# Patient Record
Sex: Male | Born: 1955 | Smoking: Current every day smoker
Health system: Southern US, Community
[De-identification: ages and names within clinical notes are randomized; demographics above are authoritative.]

## PROBLEM LIST (undated history)

## (undated) DIAGNOSIS — I10 Essential (primary) hypertension: Secondary | ICD-10-CM

## (undated) DIAGNOSIS — F32A Depression, unspecified: Secondary | ICD-10-CM

## (undated) DIAGNOSIS — H9190 Unspecified hearing loss, unspecified ear: Secondary | ICD-10-CM

## (undated) DIAGNOSIS — M1990 Unspecified osteoarthritis, unspecified site: Secondary | ICD-10-CM

## (undated) DIAGNOSIS — E119 Type 2 diabetes mellitus without complications: Secondary | ICD-10-CM

## (undated) DIAGNOSIS — I509 Heart failure, unspecified: Secondary | ICD-10-CM

## (undated) DIAGNOSIS — E785 Hyperlipidemia, unspecified: Secondary | ICD-10-CM

---

## 1993-02-08 HISTORY — PX: APPENDECTOMY: SHX54

## 2008-02-09 HISTORY — PX: CAROTID ENDARTERECTOMY: SUR193

## 2015-03-12 DIAGNOSIS — E782 Mixed hyperlipidemia: Secondary | ICD-10-CM | POA: Diagnosis not present

## 2015-03-12 DIAGNOSIS — E1165 Type 2 diabetes mellitus with hyperglycemia: Secondary | ICD-10-CM | POA: Diagnosis not present

## 2015-03-12 DIAGNOSIS — E559 Vitamin D deficiency, unspecified: Secondary | ICD-10-CM | POA: Diagnosis not present

## 2015-03-12 DIAGNOSIS — I10 Essential (primary) hypertension: Secondary | ICD-10-CM | POA: Diagnosis not present

## 2015-04-22 DIAGNOSIS — Z5181 Encounter for therapeutic drug level monitoring: Secondary | ICD-10-CM | POA: Diagnosis not present

## 2015-04-30 DIAGNOSIS — Z79899 Other long term (current) drug therapy: Secondary | ICD-10-CM | POA: Diagnosis not present

## 2015-04-30 DIAGNOSIS — E1165 Type 2 diabetes mellitus with hyperglycemia: Secondary | ICD-10-CM | POA: Diagnosis not present

## 2015-04-30 DIAGNOSIS — E782 Mixed hyperlipidemia: Secondary | ICD-10-CM | POA: Diagnosis not present

## 2015-04-30 DIAGNOSIS — I10 Essential (primary) hypertension: Secondary | ICD-10-CM | POA: Diagnosis not present

## 2015-04-30 DIAGNOSIS — E038 Other specified hypothyroidism: Secondary | ICD-10-CM | POA: Diagnosis not present

## 2015-04-30 DIAGNOSIS — D518 Other vitamin B12 deficiency anemias: Secondary | ICD-10-CM | POA: Diagnosis not present

## 2015-04-30 DIAGNOSIS — E559 Vitamin D deficiency, unspecified: Secondary | ICD-10-CM | POA: Diagnosis not present

## 2015-04-30 DIAGNOSIS — J069 Acute upper respiratory infection, unspecified: Secondary | ICD-10-CM | POA: Diagnosis not present

## 2015-05-14 DIAGNOSIS — J189 Pneumonia, unspecified organism: Secondary | ICD-10-CM | POA: Diagnosis not present

## 2015-05-14 DIAGNOSIS — R05 Cough: Secondary | ICD-10-CM | POA: Diagnosis not present

## 2015-05-14 DIAGNOSIS — E1165 Type 2 diabetes mellitus with hyperglycemia: Secondary | ICD-10-CM | POA: Diagnosis not present

## 2015-05-14 DIAGNOSIS — J188 Other pneumonia, unspecified organism: Secondary | ICD-10-CM | POA: Diagnosis not present

## 2015-06-17 DIAGNOSIS — J449 Chronic obstructive pulmonary disease, unspecified: Secondary | ICD-10-CM | POA: Diagnosis not present

## 2015-06-17 DIAGNOSIS — K5792 Diverticulitis of intestine, part unspecified, without perforation or abscess without bleeding: Secondary | ICD-10-CM | POA: Diagnosis not present

## 2015-06-17 DIAGNOSIS — E1165 Type 2 diabetes mellitus with hyperglycemia: Secondary | ICD-10-CM | POA: Diagnosis not present

## 2015-06-17 DIAGNOSIS — I10 Essential (primary) hypertension: Secondary | ICD-10-CM | POA: Diagnosis not present

## 2015-06-20 DIAGNOSIS — Z5181 Encounter for therapeutic drug level monitoring: Secondary | ICD-10-CM | POA: Diagnosis not present

## 2015-07-21 DIAGNOSIS — Z5181 Encounter for therapeutic drug level monitoring: Secondary | ICD-10-CM | POA: Diagnosis not present

## 2015-08-11 DIAGNOSIS — Z5181 Encounter for therapeutic drug level monitoring: Secondary | ICD-10-CM | POA: Diagnosis not present

## 2015-08-11 DIAGNOSIS — D518 Other vitamin B12 deficiency anemias: Secondary | ICD-10-CM | POA: Diagnosis not present

## 2015-08-11 DIAGNOSIS — Z0000 Encounter for general adult medical examination without abnormal findings: Secondary | ICD-10-CM | POA: Diagnosis not present

## 2015-08-11 DIAGNOSIS — J449 Chronic obstructive pulmonary disease, unspecified: Secondary | ICD-10-CM | POA: Diagnosis not present

## 2015-08-11 DIAGNOSIS — Z79899 Other long term (current) drug therapy: Secondary | ICD-10-CM | POA: Diagnosis not present

## 2015-08-11 DIAGNOSIS — E038 Other specified hypothyroidism: Secondary | ICD-10-CM | POA: Diagnosis not present

## 2015-08-11 DIAGNOSIS — E559 Vitamin D deficiency, unspecified: Secondary | ICD-10-CM | POA: Diagnosis not present

## 2015-08-11 DIAGNOSIS — I10 Essential (primary) hypertension: Secondary | ICD-10-CM | POA: Diagnosis not present

## 2015-08-11 DIAGNOSIS — E782 Mixed hyperlipidemia: Secondary | ICD-10-CM | POA: Diagnosis not present

## 2015-08-11 DIAGNOSIS — E1165 Type 2 diabetes mellitus with hyperglycemia: Secondary | ICD-10-CM | POA: Diagnosis not present

## 2015-09-15 DIAGNOSIS — Z79899 Other long term (current) drug therapy: Secondary | ICD-10-CM | POA: Diagnosis not present

## 2015-09-18 DIAGNOSIS — M545 Low back pain: Secondary | ICD-10-CM | POA: Diagnosis not present

## 2015-09-18 DIAGNOSIS — M47816 Spondylosis without myelopathy or radiculopathy, lumbar region: Secondary | ICD-10-CM | POA: Diagnosis not present

## 2015-10-20 DIAGNOSIS — Z5181 Encounter for therapeutic drug level monitoring: Secondary | ICD-10-CM | POA: Diagnosis not present

## 2015-11-10 DIAGNOSIS — I10 Essential (primary) hypertension: Secondary | ICD-10-CM | POA: Diagnosis not present

## 2015-11-10 DIAGNOSIS — E559 Vitamin D deficiency, unspecified: Secondary | ICD-10-CM | POA: Diagnosis not present

## 2015-11-10 DIAGNOSIS — E038 Other specified hypothyroidism: Secondary | ICD-10-CM | POA: Diagnosis not present

## 2015-11-10 DIAGNOSIS — E1165 Type 2 diabetes mellitus with hyperglycemia: Secondary | ICD-10-CM | POA: Diagnosis not present

## 2015-11-10 DIAGNOSIS — D518 Other vitamin B12 deficiency anemias: Secondary | ICD-10-CM | POA: Diagnosis not present

## 2015-11-10 DIAGNOSIS — E782 Mixed hyperlipidemia: Secondary | ICD-10-CM | POA: Diagnosis not present

## 2015-11-10 DIAGNOSIS — Z79899 Other long term (current) drug therapy: Secondary | ICD-10-CM | POA: Diagnosis not present

## 2015-12-15 DIAGNOSIS — M545 Low back pain: Secondary | ICD-10-CM | POA: Diagnosis not present

## 2015-12-15 DIAGNOSIS — F331 Major depressive disorder, recurrent, moderate: Secondary | ICD-10-CM | POA: Diagnosis not present

## 2015-12-15 DIAGNOSIS — E1165 Type 2 diabetes mellitus with hyperglycemia: Secondary | ICD-10-CM | POA: Diagnosis not present

## 2015-12-15 DIAGNOSIS — Z5181 Encounter for therapeutic drug level monitoring: Secondary | ICD-10-CM | POA: Diagnosis not present

## 2016-01-16 DIAGNOSIS — E782 Mixed hyperlipidemia: Secondary | ICD-10-CM | POA: Diagnosis not present

## 2016-01-16 DIAGNOSIS — Z5181 Encounter for therapeutic drug level monitoring: Secondary | ICD-10-CM | POA: Diagnosis not present

## 2016-01-16 DIAGNOSIS — E559 Vitamin D deficiency, unspecified: Secondary | ICD-10-CM | POA: Diagnosis not present

## 2016-01-16 DIAGNOSIS — I10 Essential (primary) hypertension: Secondary | ICD-10-CM | POA: Diagnosis not present

## 2016-01-16 DIAGNOSIS — E1165 Type 2 diabetes mellitus with hyperglycemia: Secondary | ICD-10-CM | POA: Diagnosis not present

## 2016-02-16 DIAGNOSIS — E038 Other specified hypothyroidism: Secondary | ICD-10-CM | POA: Diagnosis not present

## 2016-02-16 DIAGNOSIS — E559 Vitamin D deficiency, unspecified: Secondary | ICD-10-CM | POA: Diagnosis not present

## 2016-02-16 DIAGNOSIS — E1165 Type 2 diabetes mellitus with hyperglycemia: Secondary | ICD-10-CM | POA: Diagnosis not present

## 2016-02-16 DIAGNOSIS — Z79899 Other long term (current) drug therapy: Secondary | ICD-10-CM | POA: Diagnosis not present

## 2016-02-16 DIAGNOSIS — I10 Essential (primary) hypertension: Secondary | ICD-10-CM | POA: Diagnosis not present

## 2016-02-16 DIAGNOSIS — D518 Other vitamin B12 deficiency anemias: Secondary | ICD-10-CM | POA: Diagnosis not present

## 2016-02-16 DIAGNOSIS — E782 Mixed hyperlipidemia: Secondary | ICD-10-CM | POA: Diagnosis not present

## 2016-02-16 DIAGNOSIS — Z5181 Encounter for therapeutic drug level monitoring: Secondary | ICD-10-CM | POA: Diagnosis not present

## 2016-03-17 DIAGNOSIS — I10 Essential (primary) hypertension: Secondary | ICD-10-CM | POA: Diagnosis not present

## 2016-03-17 DIAGNOSIS — E782 Mixed hyperlipidemia: Secondary | ICD-10-CM | POA: Diagnosis not present

## 2016-03-17 DIAGNOSIS — J449 Chronic obstructive pulmonary disease, unspecified: Secondary | ICD-10-CM | POA: Diagnosis not present

## 2016-03-17 DIAGNOSIS — E1165 Type 2 diabetes mellitus with hyperglycemia: Secondary | ICD-10-CM | POA: Diagnosis not present

## 2016-03-19 DIAGNOSIS — Z5181 Encounter for therapeutic drug level monitoring: Secondary | ICD-10-CM | POA: Diagnosis not present

## 2016-04-16 DIAGNOSIS — E782 Mixed hyperlipidemia: Secondary | ICD-10-CM | POA: Diagnosis not present

## 2016-04-16 DIAGNOSIS — Z5181 Encounter for therapeutic drug level monitoring: Secondary | ICD-10-CM | POA: Diagnosis not present

## 2016-04-16 DIAGNOSIS — I10 Essential (primary) hypertension: Secondary | ICD-10-CM | POA: Diagnosis not present

## 2016-04-16 DIAGNOSIS — E1165 Type 2 diabetes mellitus with hyperglycemia: Secondary | ICD-10-CM | POA: Diagnosis not present

## 2016-04-16 DIAGNOSIS — E559 Vitamin D deficiency, unspecified: Secondary | ICD-10-CM | POA: Diagnosis not present

## 2016-04-22 DIAGNOSIS — I10 Essential (primary) hypertension: Secondary | ICD-10-CM | POA: Diagnosis not present

## 2016-04-22 DIAGNOSIS — E1165 Type 2 diabetes mellitus with hyperglycemia: Secondary | ICD-10-CM | POA: Diagnosis not present

## 2016-04-22 DIAGNOSIS — J449 Chronic obstructive pulmonary disease, unspecified: Secondary | ICD-10-CM | POA: Diagnosis not present

## 2016-04-22 DIAGNOSIS — E782 Mixed hyperlipidemia: Secondary | ICD-10-CM | POA: Diagnosis not present

## 2016-05-17 DIAGNOSIS — E038 Other specified hypothyroidism: Secondary | ICD-10-CM | POA: Diagnosis not present

## 2016-05-17 DIAGNOSIS — Z79899 Other long term (current) drug therapy: Secondary | ICD-10-CM | POA: Diagnosis not present

## 2016-05-17 DIAGNOSIS — E782 Mixed hyperlipidemia: Secondary | ICD-10-CM | POA: Diagnosis not present

## 2016-05-17 DIAGNOSIS — D518 Other vitamin B12 deficiency anemias: Secondary | ICD-10-CM | POA: Diagnosis not present

## 2016-05-17 DIAGNOSIS — I10 Essential (primary) hypertension: Secondary | ICD-10-CM | POA: Diagnosis not present

## 2016-05-17 DIAGNOSIS — E1165 Type 2 diabetes mellitus with hyperglycemia: Secondary | ICD-10-CM | POA: Diagnosis not present

## 2016-05-17 DIAGNOSIS — Z5181 Encounter for therapeutic drug level monitoring: Secondary | ICD-10-CM | POA: Diagnosis not present

## 2016-05-17 DIAGNOSIS — R0989 Other specified symptoms and signs involving the circulatory and respiratory systems: Secondary | ICD-10-CM | POA: Diagnosis not present

## 2016-05-17 DIAGNOSIS — E559 Vitamin D deficiency, unspecified: Secondary | ICD-10-CM | POA: Diagnosis not present

## 2016-05-17 DIAGNOSIS — I739 Peripheral vascular disease, unspecified: Secondary | ICD-10-CM | POA: Diagnosis not present

## 2016-05-19 DIAGNOSIS — M79609 Pain in unspecified limb: Secondary | ICD-10-CM | POA: Diagnosis not present

## 2016-05-21 DIAGNOSIS — I10 Essential (primary) hypertension: Secondary | ICD-10-CM | POA: Diagnosis not present

## 2016-05-21 DIAGNOSIS — E1165 Type 2 diabetes mellitus with hyperglycemia: Secondary | ICD-10-CM | POA: Diagnosis not present

## 2016-05-21 DIAGNOSIS — J449 Chronic obstructive pulmonary disease, unspecified: Secondary | ICD-10-CM | POA: Diagnosis not present

## 2016-05-21 DIAGNOSIS — E782 Mixed hyperlipidemia: Secondary | ICD-10-CM | POA: Diagnosis not present

## 2016-06-14 DIAGNOSIS — E782 Mixed hyperlipidemia: Secondary | ICD-10-CM | POA: Diagnosis not present

## 2016-06-14 DIAGNOSIS — Z5181 Encounter for therapeutic drug level monitoring: Secondary | ICD-10-CM | POA: Diagnosis not present

## 2016-06-14 DIAGNOSIS — I10 Essential (primary) hypertension: Secondary | ICD-10-CM | POA: Diagnosis not present

## 2016-06-14 DIAGNOSIS — E559 Vitamin D deficiency, unspecified: Secondary | ICD-10-CM | POA: Diagnosis not present

## 2016-06-14 DIAGNOSIS — E1165 Type 2 diabetes mellitus with hyperglycemia: Secondary | ICD-10-CM | POA: Diagnosis not present

## 2016-07-15 DIAGNOSIS — Z5181 Encounter for therapeutic drug level monitoring: Secondary | ICD-10-CM | POA: Diagnosis not present

## 2016-07-19 DIAGNOSIS — E782 Mixed hyperlipidemia: Secondary | ICD-10-CM | POA: Diagnosis not present

## 2016-07-19 DIAGNOSIS — E1165 Type 2 diabetes mellitus with hyperglycemia: Secondary | ICD-10-CM | POA: Diagnosis not present

## 2016-07-19 DIAGNOSIS — E559 Vitamin D deficiency, unspecified: Secondary | ICD-10-CM | POA: Diagnosis not present

## 2016-07-19 DIAGNOSIS — I10 Essential (primary) hypertension: Secondary | ICD-10-CM | POA: Diagnosis not present

## 2016-08-04 DIAGNOSIS — E782 Mixed hyperlipidemia: Secondary | ICD-10-CM | POA: Diagnosis not present

## 2016-08-04 DIAGNOSIS — E1165 Type 2 diabetes mellitus with hyperglycemia: Secondary | ICD-10-CM | POA: Diagnosis not present

## 2016-08-04 DIAGNOSIS — J449 Chronic obstructive pulmonary disease, unspecified: Secondary | ICD-10-CM | POA: Diagnosis not present

## 2016-08-04 DIAGNOSIS — I10 Essential (primary) hypertension: Secondary | ICD-10-CM | POA: Diagnosis not present

## 2016-08-13 DIAGNOSIS — I10 Essential (primary) hypertension: Secondary | ICD-10-CM | POA: Diagnosis not present

## 2016-08-13 DIAGNOSIS — E1165 Type 2 diabetes mellitus with hyperglycemia: Secondary | ICD-10-CM | POA: Diagnosis not present

## 2016-08-13 DIAGNOSIS — Z5181 Encounter for therapeutic drug level monitoring: Secondary | ICD-10-CM | POA: Diagnosis not present

## 2016-08-13 DIAGNOSIS — E782 Mixed hyperlipidemia: Secondary | ICD-10-CM | POA: Diagnosis not present

## 2016-08-13 DIAGNOSIS — E559 Vitamin D deficiency, unspecified: Secondary | ICD-10-CM | POA: Diagnosis not present

## 2016-09-10 DIAGNOSIS — E559 Vitamin D deficiency, unspecified: Secondary | ICD-10-CM | POA: Diagnosis not present

## 2016-09-10 DIAGNOSIS — E782 Mixed hyperlipidemia: Secondary | ICD-10-CM | POA: Diagnosis not present

## 2016-09-10 DIAGNOSIS — E1165 Type 2 diabetes mellitus with hyperglycemia: Secondary | ICD-10-CM | POA: Diagnosis not present

## 2016-09-10 DIAGNOSIS — Z5181 Encounter for therapeutic drug level monitoring: Secondary | ICD-10-CM | POA: Diagnosis not present

## 2016-09-10 DIAGNOSIS — I10 Essential (primary) hypertension: Secondary | ICD-10-CM | POA: Diagnosis not present

## 2016-10-08 DIAGNOSIS — Z5181 Encounter for therapeutic drug level monitoring: Secondary | ICD-10-CM | POA: Diagnosis not present

## 2016-10-08 DIAGNOSIS — E1165 Type 2 diabetes mellitus with hyperglycemia: Secondary | ICD-10-CM | POA: Diagnosis not present

## 2016-10-08 DIAGNOSIS — E782 Mixed hyperlipidemia: Secondary | ICD-10-CM | POA: Diagnosis not present

## 2016-10-08 DIAGNOSIS — Z0000 Encounter for general adult medical examination without abnormal findings: Secondary | ICD-10-CM | POA: Diagnosis not present

## 2016-10-08 DIAGNOSIS — I10 Essential (primary) hypertension: Secondary | ICD-10-CM | POA: Diagnosis not present

## 2016-10-08 DIAGNOSIS — E559 Vitamin D deficiency, unspecified: Secondary | ICD-10-CM | POA: Diagnosis not present

## 2016-10-08 DIAGNOSIS — Z1211 Encounter for screening for malignant neoplasm of colon: Secondary | ICD-10-CM | POA: Diagnosis not present

## 2016-11-01 DIAGNOSIS — Z79899 Other long term (current) drug therapy: Secondary | ICD-10-CM | POA: Diagnosis not present

## 2016-11-01 DIAGNOSIS — E559 Vitamin D deficiency, unspecified: Secondary | ICD-10-CM | POA: Diagnosis not present

## 2016-11-01 DIAGNOSIS — E038 Other specified hypothyroidism: Secondary | ICD-10-CM | POA: Diagnosis not present

## 2016-11-01 DIAGNOSIS — D518 Other vitamin B12 deficiency anemias: Secondary | ICD-10-CM | POA: Diagnosis not present

## 2016-11-01 DIAGNOSIS — I10 Essential (primary) hypertension: Secondary | ICD-10-CM | POA: Diagnosis not present

## 2016-11-01 DIAGNOSIS — E1165 Type 2 diabetes mellitus with hyperglycemia: Secondary | ICD-10-CM | POA: Diagnosis not present

## 2016-11-01 DIAGNOSIS — Z5181 Encounter for therapeutic drug level monitoring: Secondary | ICD-10-CM | POA: Diagnosis not present

## 2016-11-01 DIAGNOSIS — E782 Mixed hyperlipidemia: Secondary | ICD-10-CM | POA: Diagnosis not present

## 2016-12-01 DIAGNOSIS — E1165 Type 2 diabetes mellitus with hyperglycemia: Secondary | ICD-10-CM | POA: Diagnosis not present

## 2016-12-01 DIAGNOSIS — E782 Mixed hyperlipidemia: Secondary | ICD-10-CM | POA: Diagnosis not present

## 2016-12-01 DIAGNOSIS — I10 Essential (primary) hypertension: Secondary | ICD-10-CM | POA: Diagnosis not present

## 2016-12-01 DIAGNOSIS — E559 Vitamin D deficiency, unspecified: Secondary | ICD-10-CM | POA: Diagnosis not present

## 2016-12-01 DIAGNOSIS — Z5181 Encounter for therapeutic drug level monitoring: Secondary | ICD-10-CM | POA: Diagnosis not present

## 2017-01-03 DIAGNOSIS — E559 Vitamin D deficiency, unspecified: Secondary | ICD-10-CM | POA: Diagnosis not present

## 2017-01-03 DIAGNOSIS — E782 Mixed hyperlipidemia: Secondary | ICD-10-CM | POA: Diagnosis not present

## 2017-01-03 DIAGNOSIS — I10 Essential (primary) hypertension: Secondary | ICD-10-CM | POA: Diagnosis not present

## 2017-01-03 DIAGNOSIS — E1165 Type 2 diabetes mellitus with hyperglycemia: Secondary | ICD-10-CM | POA: Diagnosis not present

## 2017-01-03 DIAGNOSIS — Z5181 Encounter for therapeutic drug level monitoring: Secondary | ICD-10-CM | POA: Diagnosis not present

## 2017-01-21 DIAGNOSIS — E1165 Type 2 diabetes mellitus with hyperglycemia: Secondary | ICD-10-CM | POA: Diagnosis not present

## 2017-01-21 DIAGNOSIS — R070 Pain in throat: Secondary | ICD-10-CM | POA: Diagnosis not present

## 2017-01-21 DIAGNOSIS — R51 Headache: Secondary | ICD-10-CM | POA: Diagnosis not present

## 2017-01-21 DIAGNOSIS — R05 Cough: Secondary | ICD-10-CM | POA: Diagnosis not present

## 2017-01-21 DIAGNOSIS — J069 Acute upper respiratory infection, unspecified: Secondary | ICD-10-CM | POA: Diagnosis not present

## 2017-01-28 DIAGNOSIS — Z5181 Encounter for therapeutic drug level monitoring: Secondary | ICD-10-CM | POA: Diagnosis not present

## 2017-01-28 DIAGNOSIS — I10 Essential (primary) hypertension: Secondary | ICD-10-CM | POA: Diagnosis not present

## 2017-01-28 DIAGNOSIS — E559 Vitamin D deficiency, unspecified: Secondary | ICD-10-CM | POA: Diagnosis not present

## 2017-01-28 DIAGNOSIS — E782 Mixed hyperlipidemia: Secondary | ICD-10-CM | POA: Diagnosis not present

## 2017-01-28 DIAGNOSIS — E1165 Type 2 diabetes mellitus with hyperglycemia: Secondary | ICD-10-CM | POA: Diagnosis not present

## 2019-07-26 ENCOUNTER — Encounter (HOSPITAL_COMMUNITY): Payer: Self-pay | Admitting: Family Medicine

## 2019-07-26 ENCOUNTER — Emergency Department (HOSPITAL_COMMUNITY): Payer: Medicare Other

## 2019-07-26 ENCOUNTER — Other Ambulatory Visit: Payer: Self-pay

## 2019-07-26 ENCOUNTER — Inpatient Hospital Stay (HOSPITAL_COMMUNITY)
Admission: RE | Admit: 2019-07-26 | Discharge: 2019-10-09 | DRG: 003 | Disposition: A | Discharge status: Other Acute Inpatient Hospital | Payer: Medicare Other | Attending: Thoracic Surgery (Cardiothoracic Vascular Surgery) | Admitting: Thoracic Surgery (Cardiothoracic Vascular Surgery)

## 2019-07-26 DIAGNOSIS — J189 Pneumonia, unspecified organism: Secondary | ICD-10-CM | POA: Diagnosis not present

## 2019-07-26 DIAGNOSIS — I472 Ventricular tachycardia: Secondary | ICD-10-CM | POA: Diagnosis not present

## 2019-07-26 DIAGNOSIS — Z20822 Contact with and (suspected) exposure to covid-19: Secondary | ICD-10-CM | POA: Diagnosis not present

## 2019-07-26 DIAGNOSIS — I4901 Ventricular fibrillation: Secondary | ICD-10-CM | POA: Diagnosis not present

## 2019-07-26 DIAGNOSIS — J440 Chronic obstructive pulmonary disease with acute lower respiratory infection: Secondary | ICD-10-CM | POA: Diagnosis present

## 2019-07-26 DIAGNOSIS — E43 Unspecified severe protein-calorie malnutrition: Secondary | ICD-10-CM | POA: Diagnosis not present

## 2019-07-26 DIAGNOSIS — I4892 Unspecified atrial flutter: Secondary | ICD-10-CM | POA: Diagnosis not present

## 2019-07-26 DIAGNOSIS — K7200 Acute and subacute hepatic failure without coma: Secondary | ICD-10-CM | POA: Diagnosis not present

## 2019-07-26 DIAGNOSIS — Y838 Other surgical procedures as the cause of abnormal reaction of the patient, or of later complication, without mention of misadventure at the time of the procedure: Secondary | ICD-10-CM | POA: Diagnosis not present

## 2019-07-26 DIAGNOSIS — E1122 Type 2 diabetes mellitus with diabetic chronic kidney disease: Secondary | ICD-10-CM | POA: Diagnosis present

## 2019-07-26 DIAGNOSIS — I5022 Chronic systolic (congestive) heart failure: Secondary | ICD-10-CM | POA: Diagnosis not present

## 2019-07-26 DIAGNOSIS — R4182 Altered mental status, unspecified: Secondary | ICD-10-CM | POA: Diagnosis not present

## 2019-07-26 DIAGNOSIS — D62 Acute posthemorrhagic anemia: Secondary | ICD-10-CM | POA: Diagnosis not present

## 2019-07-26 DIAGNOSIS — J9602 Acute respiratory failure with hypercapnia: Secondary | ICD-10-CM | POA: Diagnosis not present

## 2019-07-26 DIAGNOSIS — R509 Fever, unspecified: Secondary | ICD-10-CM

## 2019-07-26 DIAGNOSIS — Z9889 Other specified postprocedural states: Secondary | ICD-10-CM

## 2019-07-26 DIAGNOSIS — Z792 Long term (current) use of antibiotics: Secondary | ICD-10-CM

## 2019-07-26 DIAGNOSIS — R7989 Other specified abnormal findings of blood chemistry: Secondary | ICD-10-CM | POA: Diagnosis not present

## 2019-07-26 DIAGNOSIS — I5043 Acute on chronic combined systolic (congestive) and diastolic (congestive) heart failure: Secondary | ICD-10-CM | POA: Diagnosis not present

## 2019-07-26 DIAGNOSIS — R570 Cardiogenic shock: Secondary | ICD-10-CM

## 2019-07-26 DIAGNOSIS — Z7984 Long term (current) use of oral hypoglycemic drugs: Secondary | ICD-10-CM

## 2019-07-26 DIAGNOSIS — D7582 Heparin induced thrombocytopenia (HIT): Secondary | ICD-10-CM | POA: Diagnosis not present

## 2019-07-26 DIAGNOSIS — E440 Moderate protein-calorie malnutrition: Secondary | ICD-10-CM | POA: Diagnosis not present

## 2019-07-26 DIAGNOSIS — N179 Acute kidney failure, unspecified: Secondary | ICD-10-CM | POA: Diagnosis not present

## 2019-07-26 DIAGNOSIS — R1112 Projectile vomiting: Secondary | ICD-10-CM | POA: Diagnosis not present

## 2019-07-26 DIAGNOSIS — T85628A Displacement of other specified internal prosthetic devices, implants and grafts, initial encounter: Secondary | ICD-10-CM | POA: Diagnosis not present

## 2019-07-26 DIAGNOSIS — R609 Edema, unspecified: Secondary | ICD-10-CM | POA: Diagnosis not present

## 2019-07-26 DIAGNOSIS — E11649 Type 2 diabetes mellitus with hypoglycemia without coma: Secondary | ICD-10-CM | POA: Diagnosis not present

## 2019-07-26 DIAGNOSIS — F17210 Nicotine dependence, cigarettes, uncomplicated: Secondary | ICD-10-CM | POA: Diagnosis present

## 2019-07-26 DIAGNOSIS — Z952 Presence of prosthetic heart valve: Secondary | ICD-10-CM | POA: Diagnosis not present

## 2019-07-26 DIAGNOSIS — I314 Cardiac tamponade: Secondary | ICD-10-CM | POA: Diagnosis not present

## 2019-07-26 DIAGNOSIS — Z930 Tracheostomy status: Secondary | ICD-10-CM

## 2019-07-26 DIAGNOSIS — Y738 Miscellaneous gastroenterology and urology devices associated with adverse incidents, not elsewhere classified: Secondary | ICD-10-CM | POA: Diagnosis not present

## 2019-07-26 DIAGNOSIS — J80 Acute respiratory distress syndrome: Secondary | ICD-10-CM | POA: Diagnosis not present

## 2019-07-26 DIAGNOSIS — Z452 Encounter for adjustment and management of vascular access device: Secondary | ICD-10-CM

## 2019-07-26 DIAGNOSIS — R0602 Shortness of breath: Secondary | ICD-10-CM | POA: Diagnosis present

## 2019-07-26 DIAGNOSIS — Z79891 Long term (current) use of opiate analgesic: Secondary | ICD-10-CM

## 2019-07-26 DIAGNOSIS — I469 Cardiac arrest, cause unspecified: Secondary | ICD-10-CM | POA: Diagnosis not present

## 2019-07-26 DIAGNOSIS — E1152 Type 2 diabetes mellitus with diabetic peripheral angiopathy with gangrene: Secondary | ICD-10-CM | POA: Diagnosis not present

## 2019-07-26 DIAGNOSIS — Z951 Presence of aortocoronary bypass graft: Secondary | ICD-10-CM | POA: Diagnosis not present

## 2019-07-26 DIAGNOSIS — N289 Disorder of kidney and ureter, unspecified: Secondary | ICD-10-CM | POA: Diagnosis not present

## 2019-07-26 DIAGNOSIS — E876 Hypokalemia: Secondary | ICD-10-CM | POA: Diagnosis not present

## 2019-07-26 DIAGNOSIS — Z978 Presence of other specified devices: Secondary | ICD-10-CM

## 2019-07-26 DIAGNOSIS — I97638 Postprocedural hematoma of a circulatory system organ or structure following other circulatory system procedure: Secondary | ICD-10-CM | POA: Diagnosis not present

## 2019-07-26 DIAGNOSIS — F1210 Cannabis abuse, uncomplicated: Secondary | ICD-10-CM | POA: Diagnosis present

## 2019-07-26 DIAGNOSIS — K567 Ileus, unspecified: Secondary | ICD-10-CM | POA: Diagnosis not present

## 2019-07-26 DIAGNOSIS — K810 Acute cholecystitis: Secondary | ICD-10-CM | POA: Diagnosis not present

## 2019-07-26 DIAGNOSIS — G894 Chronic pain syndrome: Secondary | ICD-10-CM | POA: Diagnosis not present

## 2019-07-26 DIAGNOSIS — Z6822 Body mass index (BMI) 22.0-22.9, adult: Secondary | ICD-10-CM

## 2019-07-26 DIAGNOSIS — I480 Paroxysmal atrial fibrillation: Secondary | ICD-10-CM | POA: Diagnosis not present

## 2019-07-26 DIAGNOSIS — L89304 Pressure ulcer of unspecified buttock, stage 4: Secondary | ICD-10-CM | POA: Diagnosis not present

## 2019-07-26 DIAGNOSIS — F4322 Adjustment disorder with anxiety: Secondary | ICD-10-CM | POA: Diagnosis not present

## 2019-07-26 DIAGNOSIS — I313 Pericardial effusion (noninflammatory): Secondary | ICD-10-CM | POA: Diagnosis present

## 2019-07-26 DIAGNOSIS — I255 Ischemic cardiomyopathy: Secondary | ICD-10-CM | POA: Diagnosis present

## 2019-07-26 DIAGNOSIS — E1165 Type 2 diabetes mellitus with hyperglycemia: Secondary | ICD-10-CM | POA: Diagnosis not present

## 2019-07-26 DIAGNOSIS — E039 Hypothyroidism, unspecified: Secondary | ICD-10-CM | POA: Diagnosis not present

## 2019-07-26 DIAGNOSIS — R6521 Severe sepsis with septic shock: Secondary | ICD-10-CM | POA: Diagnosis not present

## 2019-07-26 DIAGNOSIS — Y832 Surgical operation with anastomosis, bypass or graft as the cause of abnormal reaction of the patient, or of later complication, without mention of misadventure at the time of the procedure: Secondary | ICD-10-CM | POA: Diagnosis not present

## 2019-07-26 DIAGNOSIS — E782 Mixed hyperlipidemia: Secondary | ICD-10-CM | POA: Diagnosis present

## 2019-07-26 DIAGNOSIS — N401 Enlarged prostate with lower urinary tract symptoms: Secondary | ICD-10-CM | POA: Insufficient documentation

## 2019-07-26 DIAGNOSIS — J939 Pneumothorax, unspecified: Secondary | ICD-10-CM

## 2019-07-26 DIAGNOSIS — B377 Candidal sepsis: Secondary | ICD-10-CM | POA: Diagnosis not present

## 2019-07-26 DIAGNOSIS — I5030 Unspecified diastolic (congestive) heart failure: Secondary | ICD-10-CM | POA: Diagnosis not present

## 2019-07-26 DIAGNOSIS — R17 Unspecified jaundice: Secondary | ICD-10-CM | POA: Diagnosis not present

## 2019-07-26 DIAGNOSIS — F331 Major depressive disorder, recurrent, moderate: Secondary | ICD-10-CM | POA: Diagnosis present

## 2019-07-26 DIAGNOSIS — R404 Transient alteration of awareness: Secondary | ICD-10-CM | POA: Diagnosis not present

## 2019-07-26 DIAGNOSIS — I5041 Acute combined systolic (congestive) and diastolic (congestive) heart failure: Secondary | ICD-10-CM | POA: Diagnosis not present

## 2019-07-26 DIAGNOSIS — I97120 Postprocedural cardiac arrest following cardiac surgery: Secondary | ICD-10-CM | POA: Diagnosis not present

## 2019-07-26 DIAGNOSIS — I10 Essential (primary) hypertension: Secondary | ICD-10-CM | POA: Insufficient documentation

## 2019-07-26 DIAGNOSIS — Z79899 Other long term (current) drug therapy: Secondary | ICD-10-CM

## 2019-07-26 DIAGNOSIS — J9690 Respiratory failure, unspecified, unspecified whether with hypoxia or hypercapnia: Secondary | ICD-10-CM

## 2019-07-26 DIAGNOSIS — Z01810 Encounter for preprocedural cardiovascular examination: Secondary | ICD-10-CM | POA: Diagnosis not present

## 2019-07-26 DIAGNOSIS — E878 Other disorders of electrolyte and fluid balance, not elsewhere classified: Secondary | ICD-10-CM | POA: Diagnosis not present

## 2019-07-26 DIAGNOSIS — J9622 Acute and chronic respiratory failure with hypercapnia: Secondary | ICD-10-CM | POA: Diagnosis not present

## 2019-07-26 DIAGNOSIS — Z8249 Family history of ischemic heart disease and other diseases of the circulatory system: Secondary | ICD-10-CM

## 2019-07-26 DIAGNOSIS — R6520 Severe sepsis without septic shock: Secondary | ICD-10-CM | POA: Diagnosis not present

## 2019-07-26 DIAGNOSIS — R0902 Hypoxemia: Secondary | ICD-10-CM

## 2019-07-26 DIAGNOSIS — I2582 Chronic total occlusion of coronary artery: Secondary | ICD-10-CM | POA: Diagnosis present

## 2019-07-26 DIAGNOSIS — L89154 Pressure ulcer of sacral region, stage 4: Secondary | ICD-10-CM | POA: Diagnosis not present

## 2019-07-26 DIAGNOSIS — Z9911 Dependence on respirator [ventilator] status: Secondary | ICD-10-CM | POA: Diagnosis not present

## 2019-07-26 DIAGNOSIS — I519 Heart disease, unspecified: Secondary | ICD-10-CM | POA: Diagnosis not present

## 2019-07-26 DIAGNOSIS — J9621 Acute and chronic respiratory failure with hypoxia: Secondary | ICD-10-CM | POA: Diagnosis not present

## 2019-07-26 DIAGNOSIS — J9620 Acute and chronic respiratory failure, unspecified whether with hypoxia or hypercapnia: Secondary | ICD-10-CM | POA: Diagnosis not present

## 2019-07-26 DIAGNOSIS — I509 Heart failure, unspecified: Secondary | ICD-10-CM | POA: Diagnosis not present

## 2019-07-26 DIAGNOSIS — S27892A Contusion of other specified intrathoracic organs, initial encounter: Secondary | ICD-10-CM | POA: Diagnosis not present

## 2019-07-26 DIAGNOSIS — I5031 Acute diastolic (congestive) heart failure: Secondary | ICD-10-CM | POA: Diagnosis not present

## 2019-07-26 DIAGNOSIS — R0682 Tachypnea, not elsewhere classified: Secondary | ICD-10-CM | POA: Diagnosis not present

## 2019-07-26 DIAGNOSIS — L8990 Pressure ulcer of unspecified site, unspecified stage: Secondary | ICD-10-CM | POA: Insufficient documentation

## 2019-07-26 DIAGNOSIS — E870 Hyperosmolality and hypernatremia: Secondary | ICD-10-CM | POA: Diagnosis not present

## 2019-07-26 DIAGNOSIS — Y92238 Other place in hospital as the place of occurrence of the external cause: Secondary | ICD-10-CM | POA: Diagnosis not present

## 2019-07-26 DIAGNOSIS — X58XXXA Exposure to other specified factors, initial encounter: Secondary | ICD-10-CM | POA: Diagnosis not present

## 2019-07-26 DIAGNOSIS — K819 Cholecystitis, unspecified: Secondary | ICD-10-CM

## 2019-07-26 DIAGNOSIS — Z9281 Personal history of extracorporeal membrane oxygenation (ECMO): Secondary | ICD-10-CM | POA: Diagnosis not present

## 2019-07-26 DIAGNOSIS — J156 Pneumonia due to other aerobic Gram-negative bacteria: Secondary | ICD-10-CM | POA: Diagnosis present

## 2019-07-26 DIAGNOSIS — J9600 Acute respiratory failure, unspecified whether with hypoxia or hypercapnia: Secondary | ICD-10-CM

## 2019-07-26 DIAGNOSIS — I501 Left ventricular failure: Secondary | ICD-10-CM | POA: Diagnosis not present

## 2019-07-26 DIAGNOSIS — J849 Interstitial pulmonary disease, unspecified: Secondary | ICD-10-CM | POA: Diagnosis not present

## 2019-07-26 DIAGNOSIS — R569 Unspecified convulsions: Secondary | ICD-10-CM | POA: Diagnosis not present

## 2019-07-26 DIAGNOSIS — Z9289 Personal history of other medical treatment: Secondary | ICD-10-CM

## 2019-07-26 DIAGNOSIS — Z881 Allergy status to other antibiotic agents status: Secondary | ICD-10-CM

## 2019-07-26 DIAGNOSIS — J90 Pleural effusion, not elsewhere classified: Secondary | ICD-10-CM

## 2019-07-26 DIAGNOSIS — T17890A Other foreign object in other parts of respiratory tract causing asphyxiation, initial encounter: Secondary | ICD-10-CM | POA: Diagnosis not present

## 2019-07-26 DIAGNOSIS — I25110 Atherosclerotic heart disease of native coronary artery with unstable angina pectoris: Secondary | ICD-10-CM

## 2019-07-26 DIAGNOSIS — I2510 Atherosclerotic heart disease of native coronary artery without angina pectoris: Secondary | ICD-10-CM | POA: Diagnosis present

## 2019-07-26 DIAGNOSIS — E873 Alkalosis: Secondary | ICD-10-CM | POA: Diagnosis not present

## 2019-07-26 DIAGNOSIS — R0603 Acute respiratory distress: Secondary | ICD-10-CM

## 2019-07-26 DIAGNOSIS — K7460 Unspecified cirrhosis of liver: Secondary | ICD-10-CM | POA: Diagnosis present

## 2019-07-26 DIAGNOSIS — Z888 Allergy status to other drugs, medicaments and biological substances status: Secondary | ICD-10-CM

## 2019-07-26 DIAGNOSIS — Z4659 Encounter for fitting and adjustment of other gastrointestinal appliance and device: Secondary | ICD-10-CM

## 2019-07-26 DIAGNOSIS — Z419 Encounter for procedure for purposes other than remedying health state, unspecified: Secondary | ICD-10-CM

## 2019-07-26 DIAGNOSIS — N183 Chronic kidney disease, stage 3 unspecified: Secondary | ICD-10-CM | POA: Diagnosis present

## 2019-07-26 DIAGNOSIS — Z95811 Presence of heart assist device: Secondary | ICD-10-CM

## 2019-07-26 DIAGNOSIS — Z9689 Presence of other specified functional implants: Secondary | ICD-10-CM

## 2019-07-26 DIAGNOSIS — J9601 Acute respiratory failure with hypoxia: Secondary | ICD-10-CM

## 2019-07-26 DIAGNOSIS — H9190 Unspecified hearing loss, unspecified ear: Secondary | ICD-10-CM | POA: Diagnosis present

## 2019-07-26 DIAGNOSIS — I130 Hypertensive heart and chronic kidney disease with heart failure and stage 1 through stage 4 chronic kidney disease, or unspecified chronic kidney disease: Secondary | ICD-10-CM | POA: Diagnosis not present

## 2019-07-26 DIAGNOSIS — Z889 Allergy status to unspecified drugs, medicaments and biological substances status: Secondary | ICD-10-CM

## 2019-07-26 DIAGNOSIS — D6489 Other specified anemias: Secondary | ICD-10-CM | POA: Diagnosis not present

## 2019-07-26 DIAGNOSIS — Z430 Encounter for attention to tracheostomy: Secondary | ICD-10-CM

## 2019-07-26 DIAGNOSIS — I5021 Acute systolic (congestive) heart failure: Secondary | ICD-10-CM

## 2019-07-26 DIAGNOSIS — Y92230 Patient room in hospital as the place of occurrence of the external cause: Secondary | ICD-10-CM | POA: Diagnosis not present

## 2019-07-26 DIAGNOSIS — Y95 Nosocomial condition: Secondary | ICD-10-CM | POA: Diagnosis not present

## 2019-07-26 HISTORY — DX: Unspecified hearing loss, unspecified ear: H91.90

## 2019-07-26 HISTORY — DX: Type 2 diabetes mellitus without complications: E11.9

## 2019-07-26 HISTORY — DX: Hyperlipidemia, unspecified: E78.5

## 2019-07-26 HISTORY — DX: Essential (primary) hypertension: I10

## 2019-07-26 HISTORY — DX: Depression, unspecified: F32.A

## 2019-07-26 HISTORY — DX: Heart failure, unspecified: I50.9

## 2019-07-26 HISTORY — DX: Unspecified osteoarthritis, unspecified site: M19.90

## 2019-07-26 LAB — CBC
HCT: 45.8 % (ref 39.0–52.0)
Hemoglobin: 13.9 g/dL (ref 13.0–17.0)
MCH: 27 pg (ref 26.0–34.0)
MCHC: 30.3 g/dL (ref 30.0–36.0)
MCV: 88.9 fL (ref 80.0–100.0)
Platelets: 222 10*3/uL (ref 150–400)
RBC: 5.15 MIL/uL (ref 4.22–5.81)
RDW: 15.4 % (ref 11.5–15.5)
WBC: 6.9 10*3/uL (ref 4.0–10.5)
nRBC: 0 % (ref 0.0–0.2)

## 2019-07-26 LAB — BASIC METABOLIC PANEL
Anion gap: 13 (ref 5–15)
BUN: 22 mg/dL (ref 8–23)
CO2: 25 mmol/L (ref 22–32)
Calcium: 9.2 mg/dL (ref 8.9–10.3)
Chloride: 98 mmol/L (ref 98–111)
Creatinine, Ser: 1.42 mg/dL — ABNORMAL HIGH (ref 0.61–1.24)
GFR calc Af Amer: 60 mL/min — ABNORMAL LOW (ref >60)
GFR calc non Af Amer: 51 mL/min — ABNORMAL LOW (ref >60)
Glucose, Bld: 210 mg/dL — ABNORMAL HIGH (ref 70–99)
Potassium: 4.6 mmol/L (ref 3.5–5.1)
Sodium: 136 mmol/L (ref 135–145)

## 2019-07-26 LAB — SARS CORONAVIRUS 2 BY RT PCR (HOSPITAL ORDER, PERFORMED IN ~~LOC~~ HOSPITAL LAB): SARS Coronavirus 2: NEGATIVE

## 2019-07-26 LAB — TROPONIN I (HIGH SENSITIVITY)
Troponin I (High Sensitivity): 115 ng/L (ref <18)
Troponin I (High Sensitivity): 115 ng/L (ref <18)

## 2019-07-26 LAB — BRAIN NATRIURETIC PEPTIDE: B Natriuretic Peptide: 2568.2 pg/mL — ABNORMAL HIGH (ref 0.0–100.0)

## 2019-07-26 MED ORDER — CITALOPRAM HYDROBROMIDE 20 MG PO TABS
40.0000 mg | ORAL_TABLET | Freq: Every day | ORAL | Status: DC
  Administered 2019-07-27: 40 mg via ORAL
  Filled 2019-07-26 (×2): qty 2

## 2019-07-26 MED ORDER — SODIUM CHLORIDE 0.9% FLUSH: 3.0000 mL | INTRAVENOUS | Status: DC | PRN

## 2019-07-26 MED ORDER — ONDANSETRON HCL 4 MG/2ML IJ SOLN: 4.0000 mg | Freq: Four times a day (QID) | INTRAMUSCULAR | Status: DC | PRN

## 2019-07-26 MED ORDER — SODIUM CHLORIDE 0.9% FLUSH
3.0000 mL | Freq: Two times a day (BID) | INTRAVENOUS | Status: DC
  Administered 2019-07-26 – 2019-07-28 (×5): 3 mL via INTRAVENOUS

## 2019-07-26 MED ORDER — OXYCODONE HCL 5 MG PO TABS
15.0000 mg | ORAL_TABLET | Freq: Three times a day (TID) | ORAL | Status: DC | PRN
  Administered 2019-07-27 – 2019-07-31 (×6): 15 mg via ORAL
  Filled 2019-07-26 (×7): qty 3

## 2019-07-26 MED ORDER — FUROSEMIDE 10 MG/ML IJ SOLN
40.0000 mg | Freq: Once | INTRAMUSCULAR | Status: AC
  Administered 2019-07-26: 40 mg via INTRAVENOUS
  Filled 2019-07-26: qty 4

## 2019-07-26 MED ORDER — SODIUM CHLORIDE 0.9 % IV SOLN: 250.0000 mL | INTRAVENOUS | Status: DC | PRN

## 2019-07-26 MED ORDER — FUROSEMIDE 10 MG/ML IJ SOLN
20.0000 mg | Freq: Once | INTRAMUSCULAR | Status: AC
  Administered 2019-07-26: 20 mg via INTRAVENOUS
  Filled 2019-07-26: qty 2

## 2019-07-26 MED ORDER — ENOXAPARIN SODIUM 40 MG/0.4ML ~~LOC~~ SOLN
40.0000 mg | SUBCUTANEOUS | Status: DC
  Administered 2019-07-27 – 2019-07-29 (×3): 40 mg via SUBCUTANEOUS
  Filled 2019-07-26 (×4): qty 0.4

## 2019-07-26 MED ORDER — ACETAMINOPHEN 325 MG PO TABS: 650.0000 mg | ORAL_TABLET | ORAL | Status: DC | PRN

## 2019-07-26 MED ORDER — INSULIN ASPART 100 UNIT/ML ~~LOC~~ SOLN
0.0000 [IU] | Freq: Three times a day (TID) | SUBCUTANEOUS | Status: DC
  Administered 2019-07-27 (×2): 3 [IU] via SUBCUTANEOUS
  Administered 2019-07-27: 2 [IU] via SUBCUTANEOUS
  Administered 2019-07-28: 5 [IU] via SUBCUTANEOUS
  Administered 2019-07-28: 3 [IU] via SUBCUTANEOUS
  Administered 2019-07-29: 5 [IU] via SUBCUTANEOUS
  Administered 2019-07-29 (×2): 3 [IU] via SUBCUTANEOUS
  Administered 2019-07-30: 2 [IU] via SUBCUTANEOUS
  Administered 2019-07-30: 5 [IU] via SUBCUTANEOUS
  Administered 2019-07-31 (×2): 3 [IU] via SUBCUTANEOUS
  Administered 2019-07-31 – 2019-08-01 (×2): 5 [IU] via SUBCUTANEOUS
  Administered 2019-08-01: 11 [IU] via SUBCUTANEOUS
  Administered 2019-08-02: 5 [IU] via SUBCUTANEOUS

## 2019-07-26 MED ORDER — HYDROCODONE-ACETAMINOPHEN 5-325 MG PO TABS
1.0000 | ORAL_TABLET | Freq: Once | ORAL | Status: AC
  Administered 2019-07-26: 1 via ORAL
  Filled 2019-07-26: qty 1

## 2019-07-26 NOTE — ED Provider Notes (Addendum)
[facility] EMERGENCY DEPARTMENT Provider Note   CSN: [id number] Arrival date & time: 07/26/19  1217     History Chief Complaint  Patient presents with  . Shortness of Breath    Todd [name] is a 64 y.o. male.  HPI   HPI Comments: Todd [name] is a 64 y.o. male who presents to the Emergency Department complaining of leg swelling and shortness of breath.  Patient states he has been dealing with leg swelling for about 1 month and was started on Lasix by his PCP.  3 days ago he had a chest x-ray as well as an echocardiogram performed due to his symptoms.  2 days ago he was told that he was diagnosed with a right-sided pneumonia as well as CHF.  He is continued to take his Lasix.  He states he was started on levofloxacin for pneumonia.  He "knew he was coming to the hospital today" so he did not take any of his regular medications today.  He is unsure of the dosage of his current Lasix.  He states that for the last month he has been experiencing moderate bilateral lower extremity edema.  He additionally reports moderate shortness of breath that began today as well as "6 weeks" of central chest pain.  He denies any modifying factors.  Shortness of breath worsens with exertion.  He additionally reports associated subjective fevers, chills, congestion, dry cough.  He smokes about 2 to 2.5 packs/day.  He smokes marijuana daily.  He denies abdominal pain, nausea, vomiting, diarrhea, dysuria, syncope.     No past medical history on file.  There are no problems to display for this patient.    No family history on file.  Social History   Tobacco Use  . Smoking status: Not on file  Substance Use Topics  . Alcohol use: Not on file  . Drug use: Not on file    Home Medications Prior to Admission medications   Not on File    Allergies    Patient has no known allergies.  Review of Systems   Review of Systems  All other systems reviewed and are negative. Ten systems  reviewed and are negative for acute change, except as noted in the HPI.    Physical Exam Updated Vital Signs BP 118/72   Pulse 95   Temp 97.7 F (36.5 C)   Resp 18   SpO2 98%   Physical Exam Vitals and nursing note reviewed.  Constitutional:      General: He is in acute distress.     Appearance: Normal appearance. He is well-developed and normal weight. He is not ill-appearing, toxic-appearing or diaphoretic.  HENT:     Head: Normocephalic and atraumatic.     Right Ear: External ear normal.     Left Ear: External ear normal.     Nose: Nose normal.     Mouth/Throat:     Mouth: Mucous membranes are moist.     Pharynx: Oropharynx is clear. No oropharyngeal exudate or posterior oropharyngeal erythema.  Eyes:     Extraocular Movements: Extraocular movements intact.  Cardiovascular:     Rate and Rhythm: Regular rhythm. Tachycardia present.     Pulses: Normal pulses.     Heart sounds: Normal heart sounds. No murmur heard.  No friction rub. No gallop.   Pulmonary:     Effort: Pulmonary effort is normal. No tachypnea, bradypnea, accessory muscle usage or respiratory distress.     Breath sounds: No stridor. Examination of  the right-middle field reveals rales. Examination of the left-middle field reveals rales. Examination of the right-lower field reveals rales. Examination of the left-lower field reveals rales. Rales present. No decreased breath sounds, wheezing or rhonchi.     Comments: Poor inspiratory effort. Chest:     Chest wall: No mass, deformity, tenderness or crepitus.  Abdominal:     General: Abdomen is flat.     Palpations: Abdomen is soft.     Tenderness: There is no abdominal tenderness.  Musculoskeletal:        General: Normal range of motion.     Cervical back: Normal range of motion and neck supple. No tenderness.     Right lower leg: No tenderness. Edema present.     Left lower leg: No tenderness. Edema present.     Comments: 1+ pitting edema noted in the  bilateral lower extremities up to the pretibial region.  Skin:    General: Skin is warm and dry.  Neurological:     General: No focal deficit present.     Mental Status: He is alert and oriented to person, place, and time.  Psychiatric:        Mood and Affect: Mood normal.        Behavior: Behavior normal.    ED Results / Procedures / Treatments   Labs (all labs ordered are listed, but only abnormal results are displayed) Labs Reviewed  BASIC METABOLIC PANEL - Abnormal; Notable for the following components:      Result Value   Glucose, Bld 210 (*)    Creatinine, Ser 1.42 (*)    GFR calc non Af Amer 51 (*)    GFR calc Af Amer 60 (*)    All other components within normal limits  BRAIN NATRIURETIC PEPTIDE - Abnormal; Notable for the following components:   B Natriuretic Peptide 2,568.2 (*)    All other components within normal limits  TROPONIN I (HIGH SENSITIVITY) - Abnormal; Notable for the following components:   Troponin I (High Sensitivity) 115 (*)    All other components within normal limits  SARS CORONAVIRUS 2 BY RT PCR (HOSPITAL ORDER, PERFORMED IN [facility])  CBC  TROPONIN I (HIGH SENSITIVITY)   EKG EKG Interpretation  Date/Time:  Thursday July 26 2019 12:23:11 EDT Ventricular Rate:  103 PR Interval:  154 QRS Duration: 108 QT Interval:  372 QTC Calculation: 487 R Axis:   75 Text Interpretation: Sinus tachycardia Anterior infarct , age undetermined Abnormal ECG No old tracing to compare Confirmed by Pricilla Loveless [phone]) on 07/26/2019 3:32:29 PM  Radiology DG Chest 2 View  Result Date: 07/26/2019 CLINICAL DATA:  Shortness of breath and body aches for 2 weeks. History of congestive heart failure. EXAM: CHEST - 2 VIEW COMPARISON:  PA and lateral chest 05/14/2015 and 07/30/2014. FINDINGS: There is cardiomegaly, pulmonary edema and small effusions. No pneumothorax. No acute bony abnormality. IMPRESSION: Congestive heart failure. Electronically Signed    By: Drusilla Kanner M.D.   On: 07/26/2019 13:19    Procedures .Critical Care Performed by: Placido Sou, PA-C Authorized by: Placido Sou, PA-C   Critical care provider statement:    Critical care time (minutes):  45   Critical care was time spent personally by me on the following activities:  Discussions with consultants, evaluation of patient's response to treatment, examination of patient, ordering and performing treatments and interventions, ordering and review of laboratory studies, ordering and review of radiographic studies, pulse oximetry, re-evaluation of patient's condition, obtaining history  from patient or surrogate and review of old charts     Medications Ordered in ED Medications  furosemide (LASIX) injection 40 mg (has no administration in time range)  furosemide (LASIX) injection 20 mg (20 mg Intravenous Given 07/26/19 1723)  HYDROcodone-acetaminophen (NORCO/VICODIN) 5-325 MG per tablet 1 tablet (1 tablet Oral Given 07/26/19 1818)   ED Course  I have reviewed the triage vital signs and the nursing notes.  Pertinent labs & imaging results that were available during my care of the patient were reviewed by me and considered in my medical decision making (see chart for details).    MDM Rules/Calculators/A&P                          Patient is a 64 year old male with a history of CHF and right-sided pneumonia that presents due to persistent leg swelling and shortness of breath.  Physical exam is significant for bilateral rales as well as 1+ pitting edema in the lower extremities.  Patient is hard of hearing and is a poor historian.  Most of the history came from the patient's wife.  Initial labs were obtained in triage.  CBC is reassuring.  BMP shows elevated glucose at 210, creatinine of 1.42, GFR 51.  No prior lab values to compare this to.  Patient has not taken any of his regular medications today, including his medications for diabetes mellitus.  BNP is  significantly elevated at 2568.2.  We will additionally obtain a troponin.  Chest x-ray consistent with CHF.  Patient is saturating at 96 to 97% while lying in the semi-Fowlers position while I am in the room.  Will have nursing staff ambulate patient and monitor his oxygen levels.  Will give patient 20 mg of IV Lasix.  Patient has chronic pain at baseline and takes regular narcotic pain medications.  He was given 1 Vicodin in the ED.   Troponin elevated at 115. I discussed this patient with my attending physician Dr. Cristal Deer Tegeler. We both evaluated the ECG on this patient. He was discussed with Dr. Wyline Mood with cardiology. Dr. Wyline Mood was informed that patient will be admitted. Dr. Wyline Mood is planning on evaluating the patient tomorrow.  I reevaluated the patient and he states he is feeling better since receiving the IV Lasix. He feels that his shortness of breath has mildly alleviated. Prior to receiving Lasix patient was ambulated and was saturating at 95 to 96% on room air. He was slightly tachycardic upon arrival as well as hypertensive and tachypneic. His vital signs have improved significantly. No more tachycardia. No longer hypertensive. Borderline tachypneic at 19.  I discussed this patient with the internal medicine team who will admit the patient. Patient was made aware and he is amenable with this plan. COVID-19 test pending.  Note: Portions of this report may have been transcribed using voice recognition software. Every effort was made to ensure accuracy; however, inadvertent computerized transcription errors may be present.    Final Clinical Impression(s) / ED Diagnoses Final diagnoses:  Acute on chronic congestive heart failure, unspecified heart failure type [facility])    Rx / DC Orders ED Discharge Orders    None       Placido Sou, PA-C 07/26/19 1957    Placido Sou, PA-C 07/26/19 2019    Tegeler, Canary Brim, MD 07/27/19 1039

## 2019-07-26 NOTE — ED Triage Notes (Addendum)
Pt here for eval of worsening shortness of breath and bilateral lower extremity swelling since being dx with pneumonia and chf on Monday. Started abx yesterday.

## 2019-07-26 NOTE — ED Notes (Signed)
Pt ambulated around the bed with no issues. Pt's O2 saturation was 94% on the side of the bed. Pt maintained 96% on RA while walking around the bed.

## 2019-07-26 NOTE — Consult Note (Addendum)
Cardiology Consultation:   Patient ID: Todd [name] MRN: [id number]; DOB: 1955-11-25  Admit date: 07/26/2019 Date of Consult: 07/26/2019  Primary Care Provider: Galvin Proffer, MD [facility] Cardiologist: New, family reports as outpatient was to be referred to Dr Gala Romney [facility] Electrophysiologist:  None    Patient Profile:   Todd [name] is a 64 y.o. male with a hx of caritid stenosis s/p bilateral CEAs, tobacco abuse,  who is being seen today for the evaluation of SOB at the request of Dr Rush Landmark.  History of Present Illness:   Mr. Todd [name] 64 yo male new to [facility]. By his own report he reports a history of carotid stenosis s/p bilateral CEAs, tobacco abuse. He is unaware of other chronic medical history.    Presents with SOB and leg swelling x 3 months Reportedly recent diagnosis of pneumonia for which he was on abx as an outpatient.  Reportedly had an outpatient echocardiogram done by pcp and told he has heart failure, he is not aware of any other details other than his primary Dr Ardelle Park had planned to refer him to Dr Gala Romney as outpatient. Due to progressing symptmos patient elected to come into the hospital prior to appt being set up.    Cr 1.42 BUN 22 WBC 6.9 Hgb 13.9 Plt 222 BNP 2568 hstrop 115--> EKG SR, anteroseptal/anterior Qwaves CXR +pulm edema    PMH Carotid stenosis Tobacco abuse    Inpatient Medications: Scheduled Meds:  Continuous Infusions:  PRN Meds:   Allergies:   No Known Allergies  Social History:   Social History   Socioeconomic History  . Marital status: Unknown    Spouse name: Not on file  . Number of children: Not on file  . Years of education: Not on file  . Highest education level: Not on file  Occupational History  . Not on file  Tobacco Use  . Smoking status: Not on file  Substance and Sexual Activity  . Alcohol use: Not on file  . Drug use: Not on file  . Sexual activity: Not on file  Other Topics Concern  . Not  on file  Social History Narrative  . Not on file   Social Determinants of Health   Financial Resource Strain:   . Difficulty of Paying Living Expenses:   Food Insecurity:   . Worried About Programme researcher, broadcasting/film/video in the Last Year:   . Barista in the Last Year:   Transportation Needs:   . Freight forwarder (Medical):   Marland Kitchen Lack of Transportation (Non-Medical):   Physical Activity:   . Days of Exercise per Week:   . Minutes of Exercise per Session:   Stress:   . Feeling of Stress :   Social Connections:   . Frequency of Communication with Friends and Family:   . Frequency of Social Gatherings with Friends and Family:   . Attends Religious Services:   . Active Member of Clubs or Organizations:   . Attends Banker Meetings:   Marland Kitchen Marital Status:   Intimate Partner Violence:   . Fear of Current or Ex-Partner:   . Emotionally Abused:   Marland Kitchen Physically Abused:   . Sexually Abused:     Family History:   No family history on file.   ROS:  Please see the history of present illness.   All other ROS reviewed and negative.     Physical Exam/Data:   Vitals:   07/26/19 1453 07/26/19 1638 07/26/19  1639 07/26/19 1819  BP: 118/72 136/81  136/73  Pulse: 95 91  96  Resp: 18 16  (!) 21  Temp:      SpO2: 98% 95%  95%  Weight:   79.4 kg   Height:   5\' 3"  (1.6 m)     Intake/Output Summary (Last 24 hours) at 07/26/2019 1844 Last data filed at 07/26/2019 1819 Gross per 24 hour  Intake 120 ml  Output 425 ml  Net -305 ml   Last 3 Weights 07/26/2019  Weight (lbs) 175 lb  Weight (kg) 79.379 kg     Body mass index is 31 kg/m.  General:  Well nourished, well developed, in no acute distress HEENT: normal Lymph: no adenopathy Neck: elevated JVD Endocrine:  No thryomegaly Vascular: No carotid bruits; FA pulses 2+ bilaterally without bruits  Cardiac:  normal S1, S2; RRR; no murmur Lungs:  clear to auscultation bilaterally, no wheezing, rhonchi or rales  Abd: soft,  nontender, no hepatomegaly  Ext: 1-2+ bilatearl LE edema Musculoskeletal:  No deformities, BUE and BLE strength normal and equal Skin: warm and dry  Neuro:  CNs 2-12 intact, no focal abnormalities noted Psych:  Normal affect      Laboratory Data:  High Sensitivity Troponin:   Recent Labs  Lab 07/26/19 1711  TROPONINIHS 115*     Chemistry Recent Labs  Lab 07/26/19 1235  NA 136  K 4.6  CL 98  CO2 25  GLUCOSE 210*  BUN 22  CREATININE 1.42*  CALCIUM 9.2  GFRNONAA 51*  GFRAA 60*  ANIONGAP 13    No results for input(s): PROT, ALBUMIN, AST, ALT, ALKPHOS, BILITOT in the last 168 hours. Hematology Recent Labs  Lab 07/26/19 1235  WBC 6.9  RBC 5.15  HGB 13.9  HCT 45.8  MCV 88.9  MCH 27.0  MCHC 30.3  RDW 15.4  PLT 222   BNP Recent Labs  Lab 07/26/19 1236  BNP 2,568.2*    DDimer No results for input(s): DDIMER in the last 168 hours.   Radiology/Studies:  DG Chest 2 View  Result Date: 07/26/2019 CLINICAL DATA:  Shortness of breath and body aches for 2 weeks. History of congestive heart failure. EXAM: CHEST - 2 VIEW COMPARISON:  PA and lateral chest 05/14/2015 and 07/30/2014. FINDINGS: There is cardiomegaly, pulmonary edema and small effusions. No pneumothorax. No acute bony abnormality. IMPRESSION: Congestive heart failure. Electronically Signed   By: 08/01/2014 M.D.   On: 07/26/2019 13:19   {   Assessment and Plan:   1. Heart failure, unknown type - presents with volume overload, pulm edema by CXR and elevated BNP. Progressing symptoms x 3 months - reports had an echo by his pcp Dr 07/28/2019 at [facility] in [place] and told he had heart failure, he is unsure of any other details other than he was to be referred as outpatient to Dr Baldwin park  - clear volume overload, redose additional IV lasix 40mg  tonight and reassess diuretic needs tomorrow (would not schedule dosing tomorrow until evaluated) - will need to request clinic note from pcp  to understand his PMH and also echo report, pending echo findings may be additional medical therapy he requires (I have placed a nursing order to order records in AM) - with his EKG Q waves and prior bilateral CEAs high risk for ICM if his LVEF is down   2. Chest pain - nonspecific symptoms in setting of volume overload, mild trop in setting of HF - EKG with  anteroseptal/anterior Qwaves, no acute findnigs - follow trop trend, at this time do not suspect ACS. Likely from CHF, perhaps combined with chronic CAD. - home meds pending, should be on ASA/statin based on his carotid history alone. Please check lipids.      For questions or updates, please contact [facility] Please consult [email or web address] for contact info under    Signed, Carlyle Dolly, MD  07/26/2019 6:44 PM

## 2019-07-26 NOTE — ED Notes (Signed)
Pt's oxygen saturation is 88% on RA with a good waveform. Pt placed on 2L [place] O2.

## 2019-07-26 NOTE — H&P (Addendum)
Family Medicine Teaching [facility] Admission History and Physical Service Pager: [phone]  Patient name: Todd [name] Medical record number: [id number] Date of birth: 1955/02/19 Age: 64 y.o. Gender: male  Primary Care Provider: Galvin Proffer, MD Consultants: Cardiology Code Status: Full  Preferred Emergency Contact: Synetta Fail [phone]  Chief Complaint: Shortness of breath   Assessment and Plan: Todd [name] is a 64 y.o. male presenting with shortness of breath. PMH is significant for HFrEF, COPD, HTN, T2DM, HLD, MDD, chronic back pain, vitamin deficiency, BPH, tobacco use disorder, marijuana use disorder.  Shortness of breath 2/2 CHF exacerbation vs pneumonia Patient presents with 1 month history of leg swelling and more recent worsening of shortness of breath.  Per chart review he was recently diagnosed with CHF and was started on Lasix.  He saw his PCP 2 days ago who diagnosed him with pneumonia and CHF exacerbation and started him on levofloxacin for pneumonia and told him to continue his Lasix.  He continued to have worsening in shortness of breath and DOE, no longer able to walk to the bathroom, and presented to the ED.  In the ED patient had CBC which was within normal limits, BMP significant for glucose of 210, CR 1.42, BNP of 2568.2, troponin 115>115.  Chest x-ray was obtained which was consistent with CHF.  Patient was given 20 mg IV Lasix by the ED provider.  Cardiology was consulted who evaluated the patient.  They would like to obtain his echo that was just performed rather than reordering the echo at this time.  Recommended redosing Lasix tonight but not scheduling.  On evaluation patient has normal work of breathing.  When we enter the room he is on 2.5 L nasal cannula and was satting 97 to 99%.  O2 was shut off and his oxygen saturation remained between 92 to 96%.  Patient had 3+ pitting edema to his hips.  Patient reports that his shortness of breath got significantly worse over  the last 2 days.  He was diagnosed with pneumonia on Monday but did not pick up his prescription for his antibiotics until yesterday 6/16.  Denies any objective fevers but does report he was feeling hot then cold intermittently over the last week.  Patient has remained afebrile since admission.  This shortness of breath is most likely due to to CHF exacerbation given the associated with increased swelling in his lower extremities.  Pneumonia could also be considered although patient has been afebrile and has no elevated white count.  He reports a chronic cough because of smoking but does acknowledge increased congestion over the past week or so.  I feel like pneumonia is less likely and I am not inclined to start antibiotics at this time.  Much lower down on the differential would be pulmonary embolism but patient denies any history of PE, calf pain, prolonged immobilization, patient has no history of cancer. -Admit to inpatient with Dr. Deirdre Priest is attending -Cardiology consulted, appreciate recommendations -Request placed for records to be obtained from PCP -40 mg IV Lasix tonight, will reassess tomorrow to determine dosing -Holding off on reordering echo right now until records obtained from PCP, may require repeat echo if unable to obtain records -Strict I's and O's -Daily weights -Continuous pulse ox -Up with assistance -PT/OT eval and treat -Lipid panel ordered -Monitor for signs and symptoms of infection and consider antibiotic initiation if there is present themselves.   AKI  Patient's creatinine on arrival was 1.42.  Most likely cardiorenal in nature although  we are unsure of his baseline creatinine.  Suspect improvement will occur with further diuresis. -Diuresing with Lasix, will reassess in the morning to determine next dose of Lasix -Morning BMPs -Holding home lisinopril at this time -Avoid nephrotoxic agents   New-onset HF, suspect HFrEF Patient reports recent diagnosis of heart  failure.  We have none of these records.  Patient reports that he was started on Lasix approximately a month ago to help with lower extremity swelling but is unsure of the dosage.  Per chart review it appears he is on 20 mg Lasix once daily. -See above  COPD  Diagnosis is in patient's chart but patient reports he has never been told he has COPD.  He is on no medications for this. -Monitor respiratory status -Consider outpatient PFTs after discharge - encourage smoking cessation  HTN On medication includes lisinopril, Lasix 20 mg.  Blood pressure since arrival have ranged from 118/72-1 36/81.  Patient received 20 mg IV Lasix per ED provider and then an additional 40 mg IV Lasix by cardiology.  Will monitor urine response and decide on redosing tomorrow.  In his chart, it looks as thought patient has been prescribed Entresto, but has lisinopril was him and states that he takes this.  Given new HF, suspect to be HFrEF, would likely be a good candidate for Entresto in the future, but will defer to cardiology after AKI improves. - diuresis per above - holding lisinopril in setting of AKI  - f/u cardiology recs regarding future change to [facility]   T2DM Home medications include glyburide-Metformin 2.5-500 mg twice daily, Farxiga 5 mg daily, pioglitazone 45 mg daily.  Blood glucose on arrival was 210. -Monitor CBGs -Moderate sensitivity sliding scale insulin white inpatient  HLD Home medications include Livalo 4 mg daily -Continue home medications  MDD Home medications include citalopram 40 mg once daily -Continue home medications  Chronic back pain Home medications include oxycodone 15 mg 2 times daily, sometimes TID if pain is worse, gabapentin 300 mg 3 times daily.  Patient was given Vicodin in the ED for pain control. -Continue home medications as prescribed   BPH BPH noted in patient's medical history on care everywhere.  Patient is taking no medications for this -Monitor for urinary  hesitance -Continue care outpatient - consider flomax if symptomatic  Tobacco use disorder 2-2-1/2 pack/day smoker.  Patient and his wife are both planning to taper and quite soon. - encourage smoking cessation - nicotine patch while inpatient  Marijuana use disorder Daily marijuana smoker.  FEN/GI: Heart healthy carb modified Prophylaxis: Lovenox  Disposition: Admit to med telemetry  History of Present Illness:  Todd [name] is a 64 y.o. male presenting with shortness of breath and lower extremity edema.  Patient states that he was told by his PCP that he was having shortness of breath and edema, diagnosed with CHF and diagnosed with CAP.  Was given a prescription for Levaquin although reports he did not pick it up until yesterday 6/16.  Patient denies any fever, but he did "get hot then cold".  Has a chronic cough as he is a smoker, without any changes in this. Reports that he had an echocardiogram completed Monday 6/14 at Dr. Stefanie Libel office which is when he was told he had congestive heart failure..  States that over the last few days, his breathing has been worsening.  He is unsure what dose of Lasix he was taking, but states that he was taking it.  Reports that his other blood  pressure medications he is not taking since he was prescribed the antibiotic and was told not to take it while taking the antibiotic.  After her started the Lasix, he started peeing a lot "longer but not more often."  Has not taken any medications today because he knew he was coming to the hospital.  At baseline, patient walks with a walker.  Review Of Systems: Per HPI with the following additions:   Review of Systems  Constitutional: Positive for chills and fever (subjective).  HENT: Positive for congestion (chronic) and rhinorrhea. Negative for sore throat.   Eyes: Negative for visual disturbance.  Respiratory: Positive for cough (chronic, occurs randomly, sometimes wakes him at night), shortness of breath  and wheezing.   Cardiovascular: Positive for chest pain (few months), palpitations (few times in the last month) and leg swelling.       Mild orthopnea  Gastrointestinal: Positive for nausea and vomiting (1 week ago). Negative for abdominal pain, blood in stool, constipation and diarrhea.  Genitourinary: Negative for dysuria and hematuria.  Neurological: Positive for headaches (chronic, once every 2 weeks).     Patient Active Problem List   Diagnosis Date Noted  . CHF (congestive heart failure) ([place]) 07/26/2019  . Benign prostatic hyperplasia with lower urinary tract symptoms 07/26/2019  . Chronic pain syndrome 07/26/2019  . Essential hypertension 07/26/2019  . Encounter for therapeutic drug level monitoring 07/26/2019  . Mixed hyperlipidemia 07/26/2019  . Moderate recurrent major depression ([place]) 07/26/2019  . Hypothyroidism 07/26/2019  . Systolic heart failure ([place]) 07/26/2019    Past Medical History: Past Medical History:  Diagnosis Date  . Arthritis   . CHF (congestive heart failure) ([place])   . Depression   . Diabetes mellitus without complication ([place])   . Hyperlipidemia   . Hypertension     Past Surgical History: Past Surgical History:  Procedure Laterality Date  . APPENDECTOMY  1995  . CAROTID ENDARTERECTOMY  2010    Social History: Current smoker smokes 2-2-1/2 packs/day Also admits to marijuana daily  Family History: No pertinent family history  Allergies and Medications: Allergies  Allergen Reactions  . Empagliflozin     Other reaction(s): diarrhea  . Other     Other reaction(s): Unknown  . Simvastatin     Other reaction(s): diarrhea  . Sitagliptin     Other reaction(s): diarrhea/abd pain   No current facility-administered medications on file prior to encounter.   Current Outpatient Medications on File Prior to Encounter  Medication Sig Dispense Refill  . citalopram (CELEXA) 40 MG tablet Take 40 mg by mouth daily.    . ergocalciferol (VITAMIN  D2) 1.25 MG (50000 UT) capsule Take 50,000 Units by mouth 2 (two) times a week.    Marland Kitchen glipiZIDE-metformin (METAGLIP) 2.5-500 MG tablet Take 2 tablets by mouth 2 (two) times daily with a meal.    . levofloxacin (LEVAQUIN) 750 MG tablet Take 750 mg by mouth daily. For 14 days.    Marland Kitchen lisinopril (ZESTRIL) 20 MG tablet Take 20 mg by mouth daily.    Marland Kitchen oxyCODONE (ROXICODONE) 15 MG immediate release tablet Take 15 mg by mouth 3 (three) times daily.    . pioglitazone (ACTOS) 45 MG tablet Take 45 mg by mouth daily.    . promethazine (PHENERGAN) 25 MG tablet Take 25 mg by mouth 2 (two) times daily as needed for nausea or vomiting.      Objective: BP 119/77   Pulse 92   Temp 97.7 F (36.5 C)   Resp  20   Ht 5\' 3"  (1.6 m)   Wt 79.4 kg   SpO2 92%   BMI 31.00 kg/m  Physical Exam Constitutional:      General: He is not in acute distress.    Appearance: He is not ill-appearing or diaphoretic.  HENT:     Head: Normocephalic and atraumatic.     Mouth/Throat:     Mouth: Mucous membranes are moist.     Pharynx: Oropharynx is clear.  Eyes:     Extraocular Movements: Extraocular movements intact.     Pupils: Pupils are equal, round, and reactive to light.  Cardiovascular:     Rate and Rhythm: Normal rate and regular rhythm.     Pulses: No decreased pulses.     Heart sounds: No murmur heard.  No friction rub. No gallop.   Pulmonary:     Effort: Pulmonary effort is normal.     Comments: Patient has crackles midway up his lungs bilaterally, upper air fields intermittent wheeze noted Chest:     Chest wall: No mass, deformity, tenderness or edema.  Abdominal:     General: Bowel sounds are normal.     Palpations: Abdomen is soft.     Tenderness: There is no abdominal tenderness. There is no guarding.  Musculoskeletal:     Cervical back: Normal range of motion and neck supple.  Lymphadenopathy:     Cervical: No cervical adenopathy.  Skin:    General: Skin is warm and dry.  Neurological:      General: No focal deficit present.     Mental Status: He is alert and oriented to person, place, and time.     Cranial Nerves: No cranial nerve deficit.     Motor: No weakness.  Psychiatric:        Mood and Affect: Mood normal.      Labs and Imaging: CBC BMET  Recent Labs  Lab 07/26/19 1235  WBC 6.9  HGB 13.9  HCT 45.8  PLT 222   Recent Labs  Lab 07/26/19 1235  NA 136  K 4.6  CL 98  CO2 25  BUN 22  CREATININE 1.42*  GLUCOSE 210*  CALCIUM 9.2     BNP-2568.2 Troponin high-sensitivity-115> 115 DG Chest 2 View  Result Date: 07/26/2019 CLINICAL DATA:  Shortness of breath and body aches for 2 weeks. History of congestive heart failure. EXAM: CHEST - 2 VIEW COMPARISON:  PA and lateral chest 05/14/2015 and 07/30/2014. FINDINGS: There is cardiomegaly, pulmonary edema and small effusions. No pneumothorax. No acute bony abnormality. IMPRESSION: Congestive heart failure. Electronically Signed   By: 08/01/2014 M.D.   On: 07/26/2019 13:19    EKG: Sinus tachycardia, anterospetal Q waves   07/28/2019, MD 07/26/2019, 10:33 PM PGY-1, [facility] FPTS Intern pager: [phone], text pages welcome  FPTS Upper-Level Resident Addendum   I have independently interviewed and examined the patient. I have discussed the above with the original author and agree with their documentation. My edits for correction/addition/clarification are in green. Please see also any attending notes.   [phone], D.O. PGY-2, [facility] 07/26/2019 10:41 PM  FPTS Service pager: [phone] (text pages welcome through AMION)

## 2019-07-27 ENCOUNTER — Inpatient Hospital Stay (HOSPITAL_COMMUNITY): Payer: Medicare Other

## 2019-07-27 DIAGNOSIS — I509 Heart failure, unspecified: Secondary | ICD-10-CM

## 2019-07-27 DIAGNOSIS — I5021 Acute systolic (congestive) heart failure: Secondary | ICD-10-CM

## 2019-07-27 LAB — LIPID PANEL
Cholesterol: 203 mg/dL — ABNORMAL HIGH (ref 0–200)
HDL: 34 mg/dL — ABNORMAL LOW (ref >40)
LDL Cholesterol: 148 mg/dL — ABNORMAL HIGH (ref 0–99)
Total CHOL/HDL Ratio: 6 RATIO
Triglycerides: 106 mg/dL (ref <150)
VLDL: 21 mg/dL (ref 0–40)

## 2019-07-27 LAB — BASIC METABOLIC PANEL
Anion gap: 10 (ref 5–15)
Anion gap: 12 (ref 5–15)
BUN: 22 mg/dL (ref 8–23)
BUN: 22 mg/dL (ref 8–23)
CO2: 32 mmol/L (ref 22–32)
CO2: 30 mmol/L (ref 22–32)
Calcium: 9.1 mg/dL (ref 8.9–10.3)
Calcium: 8.9 mg/dL (ref 8.9–10.3)
Chloride: 95 mmol/L — ABNORMAL LOW (ref 98–111)
Chloride: 94 mmol/L — ABNORMAL LOW (ref 98–111)
Creatinine, Ser: 1.5 mg/dL — ABNORMAL HIGH (ref 0.61–1.24)
Creatinine, Ser: 1.33 mg/dL — ABNORMAL HIGH (ref 0.61–1.24)
GFR calc Af Amer: 56 mL/min — ABNORMAL LOW (ref >60)
GFR calc Af Amer: >60 mL/min (ref >60)
GFR calc non Af Amer: 48 mL/min — ABNORMAL LOW (ref >60)
GFR calc non Af Amer: 56 mL/min — ABNORMAL LOW (ref >60)
Glucose, Bld: 193 mg/dL — ABNORMAL HIGH (ref 70–99)
Glucose, Bld: 112 mg/dL — ABNORMAL HIGH (ref 70–99)
Potassium: 4.1 mmol/L (ref 3.5–5.1)
Potassium: 3.7 mmol/L (ref 3.5–5.1)
Sodium: 137 mmol/L (ref 135–145)
Sodium: 136 mmol/L (ref 135–145)

## 2019-07-27 LAB — GLUCOSE, CAPILLARY
Glucose-Capillary: 190 mg/dL — ABNORMAL HIGH (ref 70–99)
Glucose-Capillary: 173 mg/dL — ABNORMAL HIGH (ref 70–99)
Glucose-Capillary: 122 mg/dL — ABNORMAL HIGH (ref 70–99)
Glucose-Capillary: 110 mg/dL — ABNORMAL HIGH (ref 70–99)

## 2019-07-27 LAB — ECHOCARDIOGRAM COMPLETE
Height: 63 in
Weight: 2716.07 oz

## 2019-07-27 LAB — MAGNESIUM: Magnesium: 1.2 mg/dL — ABNORMAL LOW (ref 1.7–2.4)

## 2019-07-27 LAB — HEMOGLOBIN A1C
Hgb A1c MFr Bld: 9 % — ABNORMAL HIGH (ref 4.8–5.6)
Mean Plasma Glucose: 212 mg/dL

## 2019-07-27 LAB — HIV ANTIBODY (ROUTINE TESTING W REFLEX): HIV Screen 4th Generation wRfx: NONREACTIVE

## 2019-07-27 MED ORDER — ROSUVASTATIN CALCIUM 20 MG PO TABS
20.0000 mg | ORAL_TABLET | Freq: Every day | ORAL | Status: DC
  Administered 2019-07-27 – 2019-08-01 (×6): 20 mg via ORAL
  Filled 2019-07-27 (×6): qty 1

## 2019-07-27 MED ORDER — FUROSEMIDE 10 MG/ML IJ SOLN
80.0000 mg | Freq: Two times a day (BID) | INTRAMUSCULAR | Status: DC
  Administered 2019-07-27 – 2019-07-28 (×3): 80 mg via INTRAVENOUS
  Filled 2019-07-27 (×3): qty 8

## 2019-07-27 MED ORDER — POTASSIUM CHLORIDE CRYS ER 20 MEQ PO TBCR
20.0000 meq | EXTENDED_RELEASE_TABLET | Freq: Once | ORAL | Status: AC
  Administered 2019-07-27: 20 meq via ORAL
  Filled 2019-07-27: qty 1

## 2019-07-27 MED ORDER — FUROSEMIDE 10 MG/ML IJ SOLN
40.0000 mg | Freq: Once | INTRAMUSCULAR | Status: AC
  Administered 2019-07-27: 40 mg via INTRAVENOUS
  Filled 2019-07-27: qty 4

## 2019-07-27 NOTE — Progress Notes (Addendum)
Progress Note  Patient Name: Todd [name] Date of Encounter: 07/27/2019  Primary Cardiologist: [facility]  Subjective   Still feeling quite SOB. He has orthopnea and ongoing LE edema. He has occasional sharp chest pain, particularly when laying down, which lasts for 5 minutes before resolving spontaneously.   Inpatient Medications    Scheduled Meds: . citalopram  40 mg Oral Daily  . enoxaparin (LOVENOX) injection  40 mg Subcutaneous Q24H  . furosemide  80 mg Intravenous BID  . insulin aspart  0-15 Units Subcutaneous TID WC  . rosuvastatin  20 mg Oral Daily  . sodium chloride flush  3 mL Intravenous Q12H   Continuous Infusions: . sodium chloride     PRN Meds: sodium chloride, acetaminophen, oxyCODONE, sodium chloride flush   Vital Signs    Vitals:   07/27/19 0400 07/27/19 0745 07/27/19 0900 07/27/19 1120  BP: 127/81 124/76  114/75  Pulse: 81 90 92 96  Resp: 20 17 15 17   Temp: 98 F (36.7 C) 98.2 F (36.8 C)  98.1 F (36.7 C)  TempSrc: Oral Oral  Oral  SpO2: 96% 93% 93% 91%  Weight:      Height:        Intake/Output Summary (Last 24 hours) at 07/27/2019 1125 Last data filed at 07/27/2019 1100 Gross per 24 hour  Intake 300 ml  Output 2225 ml  Net -1925 ml   Filed Weights   07/26/19 1639 07/27/19 0104  Weight: 79.4 kg 77 kg    Telemetry    Sinus rhythm with occasional PVCs and coupled PVCs - Personally Reviewed  ECG    No new tracings - Personally Reviewed  Physical Exam   GEN: No acute distress.   Neck: No JVD, no carotid bruits Cardiac: RRR, no murmurs, rubs, or gallops.  Respiratory: diffuse crackles without wheezing/rhonchi GI: NABS, Soft, nontender, non-distended  MS: 2+ LE edema; No deformity. Neuro:  Nonfocal, moving all extremities spontaneously; HOH Psych: Normal affect   Labs    Chemistry Recent Labs  Lab 07/26/19 1235 07/27/19 0303  NA 136 137  K 4.6 4.1  CL 98 95*  CO2 25 32  GLUCOSE 210* 193*  BUN 22 22  CREATININE  1.42* 1.50*  CALCIUM 9.2 9.1  GFRNONAA 51* 48*  GFRAA 60* 56*  ANIONGAP 13 10     Hematology Recent Labs  Lab 07/26/19 1235  WBC 6.9  RBC 5.15  HGB 13.9  HCT 45.8  MCV 88.9  MCH 27.0  MCHC 30.3  RDW 15.4  PLT 222    Cardiac EnzymesNo results for input(s): TROPONINI in the last 168 hours. No results for input(s): TROPIPOC in the last 168 hours.   BNP Recent Labs  Lab 07/26/19 1236  BNP 2,568.2*     DDimer No results for input(s): DDIMER in the last 168 hours.   Radiology    DG Chest 2 View  Result Date: 07/26/2019 CLINICAL DATA:  Shortness of breath and body aches for 2 weeks. History of congestive heart failure. EXAM: CHEST - 2 VIEW COMPARISON:  PA and lateral chest 05/14/2015 and 07/30/2014. FINDINGS: There is cardiomegaly, pulmonary edema and small effusions. No pneumothorax. No acute bony abnormality. IMPRESSION: Congestive heart failure. Electronically Signed   By: Inge Rise M.D.   On: 07/26/2019 13:19    Cardiac Studies   Recent echo done 07/23/19: reviewed outpatient source echo EF 25-30%, G1DD, global hypokinesis, global hypokinesis Moderate RV dilation with normal systolic function Moderate RAE Mild MR/TR Small posterior  pericardial effusion - Findings are new from previous echo 07/2018 when EF was 60-65%.   Patient Profile     64 y.o. male with PMH of recently diagnosed CHF (no details - echo done at outside facility), HTN, HLD, carotid artery stenosis s/p bilateral CEAs, DM type 2, COPD, depression and tobacco abuse.   Assessment & Plan    1. Acute systolic CHF: patient presented with SOB, orthopnea, and LE edema. Recently underwent an echo to evaluate the above complaints which revealed EF 25-30%, G1DD, moderate RV dilation, moderate RAE, mild MR/TR, and a small posterior pericardial effusion. These findings are new compared to 07/2018 when EF was 60-65%. No prior ischemic evaluation. EKG with anteroseptal/anerior Q waves. BNP elevated to 2500.  HsTrop 115 x2. He received IV lasix 60mg  in the ED and 40mg  this AM. UOP is negative 1.6L this admission. Weight 175lbs on arrival > 169.7lbs this morning. Still with diffuse crackles on lung exam and 2+ LE edema.  Suspicious are quite high for ischemic etiology given carotid artery disease, HTN, HLD, DM type 2, and tobacco abuse. Needs to be medically optimized prior to ischemic eval this admission. Suspect intermittent chest pain will improve with diuresis. - Favor more aggressive diuresis - will start IV lasix 80mg  BID going forward - Continue to monitor strict I&Os and daily weights - Continue to monitor electrolytes and replete to maintain K >4, Mg >2 - Anticipate initiation of BBlocker once improved from acute decompensation - Will re-evaluate ability to add ARB/Entresto/spironolactone pending Cr response to diuresis  2. HTN: BP intermittently elevated but overall stable - Continue close monitoring   3. HLD: LDL 148 this admission; not on statin. Simvastatin listed on allergy list - Will trial crestor for risk factor modifications - Consider referral to lipid clinic if intolerant to crestor  4. DM type 2: A1C 8.6 05/2019. Favor discontinuation of actos given carotid artery disease and high suspicions for CAD. He is on farxiga and glipizide-metformin, as well.  - Continue management per primary team - Hopeful to resume farxiga on discharge if Cr stable given CHF and high suspicion for CAD  5. AoCKD stage 2: Cr up to 1.5 this admission; baseline appears to be 1.1. Unfortunately he needs more aggressive diuresis - Continue to monitor closely - Avoid nephrotoxic agents.  6. Tobacco abuse: needs to quit - Continue to encourage cessation  For questions or updates, please contact [facility] Please consult [email or web address] for contact info under Cardiology/STEMI.      Signed, , PA-C  07/27/2019, 11:25 AM   [phone]  Personally seen and examined. Agree with  above.   Sitting upright in bed.  Lower extremity edema 3+ noted.  Lungs with minimal crackles at bases.  Wife in chair next to him.  Lab work reviewed as above-creatinine 1.5, LDL 148, potassium 4.1  Assessment and plan:  Acute systolic heart failure -Continue with IV diuresis.  Goal is to perform heart catheterization left and right heart cath on Monday.  Explained to him.  Risks and benefits including stroke heart attack death renal impairment bleeding have been discussed.  He is willing to proceed.  Creatinine currently 1.5.  I will hold off on starting Entresto at this time. -We will check echocardiogram.  Hyperlipidemia -Agree with Crestor.  Tobacco use -Encourage cessation.  07/29/2019, MD

## 2019-07-27 NOTE — Evaluation (Signed)
Occupational Therapy Evaluation Patient Details Name: Todd [name] MRN: [id number] DOB: 08/01/1955 Today's Date: 07/27/2019    History of Present Illness 63 yo male with onset of SOB and chestpain was admitted, noted AKI, CHF, had recent PNA, elevated troponin, pulm edema and effusion, and cardiomegaly.  PMHx:  CHF, B CEA's, carotid stenosis, smoker, CAD, DM, depression, COPD,    Clinical Impression   PTA pt living with spouse, independent for BADL/IADL. Pt does the driving, due to spouse inability due to physical impairments. At time of eval, pt able to complete bed mobility at mod I and transfers at min guard level with QC. Pt completed beyond household level of functional mobility with QC out in hallway. He reports fatigue and some SOB with DOE 2/4. O2 sats reading ~89% on RA with poor reading. Will continue to follow pt while acute to educate on ECS strategies and assess for possible need of supplemental O2. Recommend HHOT at d/c for continued progression in home environment. Of note- wife upset due to visitation policy and her inability to drive back and forth. RN notified.    Follow Up Recommendations  Home health OT    Equipment Recommendations  3 in 1 bedside commode;Tub/shower bench    Recommendations for Other Services       Precautions / Restrictions Precautions Precautions: Fall Precaution Comments: monitor O2 sats Restrictions Weight Bearing Restrictions: No      Mobility Bed Mobility Overal bed mobility: Modified Independent                Transfers Overall transfer level: Needs assistance Equipment used: Quad cane Transfers: Sit to/from Stand Sit to Stand: Min guard         General transfer comment: min guard for safety as pt is a little impulsive    Balance Overall balance assessment: Needs assistance Sitting-balance support: Feet supported Sitting balance-Leahy Scale: Good     Standing balance support: During functional activity;Single  extremity supported Standing balance-Leahy Scale: Fair                             ADL either performed or assessed with clinical judgement   ADL Overall ADL's : Needs assistance/impaired Eating/Feeding: Set up;Sitting   Grooming: Set up;Sitting;Standing   Upper Body Bathing: Set up;Sitting   Lower Body Bathing: Minimal assistance;Sit to/from stand;Sitting/lateral leans   Upper Body Dressing : Set up;Sitting   Lower Body Dressing: Minimal assistance;Sit to/from stand;Sitting/lateral leans   Toilet Transfer: Minimal assistance;Ambulation;Regular Toilet;Grab bars   Toileting- Clothing Manipulation and Hygiene: Set up;Sitting/lateral lean;Sit to/from stand   Tub/ Shower Transfer: Minimal assistance;Ambulation;Shower seat;Rolling walker   Functional mobility during ADLs: Min guard (QC)       Vision Baseline Vision/History: No visual deficits       Perception     Praxis      Pertinent Vitals/Pain Pain Assessment: No/denies pain     Hand Dominance Right   Extremity/Trunk Assessment Upper Extremity Assessment Upper Extremity Assessment: Generalized weakness   Lower Extremity Assessment Lower Extremity Assessment: Generalized weakness   Cervical / Trunk Assessment Cervical / Trunk Assessment: Normal   Communication Communication Communication: No difficulties   Cognition Arousal/Alertness: Awake/alert Behavior During Therapy: WFL for tasks assessed/performed Overall Cognitive Status: Within Functional Limits for tasks assessed  General Comments  O2 sats were down to 87% with room air, will need to assess his use of supplemental O2    Exercises Exercises: Other exercises (4- strength B hips and knees)   Shoulder Instructions      Home Living Family/patient expects to be discharged to:: Private residence Living Arrangements: Spouse/significant other Available Help at Discharge: Family;Available  24 hours/day Type of Home: Mobile home Home Access: Level entry Entrance Stairs-Number of Steps: one small step up   Home Layout: One level     Bathroom Shower/Tub: Chief Strategy Officer: Standard     Home Equipment: IT sales professional - 2 wheels;Cane - single point   Additional Comments: pt giving inconsistent history      Prior Functioning/Environment Level of Independence: Independent with assistive device(s)        Comments: uses QC at baseline        OT Problem List: Decreased strength;Decreased knowledge of use of DME or AE;Decreased knowledge of precautions;Decreased activity tolerance;Cardiopulmonary status limiting activity;Impaired balance (sitting and/or standing)      OT Treatment/Interventions: Self-care/ADL training;Therapeutic exercise;Patient/family education;Balance training;Energy conservation;Therapeutic activities;DME and/or AE instruction    OT Goals(Current goals can be found in the care plan section) Acute Rehab OT Goals Patient Stated Goal: to get home soon OT Goal Formulation: With patient Time For Goal Achievement: 08/10/19 Potential to Achieve Goals: Good  OT Frequency: Min 2X/week   Barriers to D/C:            Co-evaluation              AM-PAC OT "6 Clicks" Daily Activity     Outcome Measure Help from another person eating meals?: None Help from another person taking care of personal grooming?: A Little Help from another person toileting, which includes using toliet, bedpan, or urinal?: A Little Help from another person bathing (including washing, rinsing, drying)?: A Little Help from another person to put on and taking off regular upper body clothing?: None Help from another person to put on and taking off regular lower body clothing?: A Little 6 Click Score: 20   End of Session Equipment Utilized During Treatment: Other (comment) (QC) Nurse Communication: Mobility status  Activity Tolerance: Patient tolerated  treatment well Patient left: in bed;with call bell/phone within reach  OT Visit Diagnosis: Unsteadiness on feet (R26.81);Other abnormalities of gait and mobility (R26.89);Muscle weakness (generalized) (M62.81)                Time: 3846-6599 OT Time Calculation (min): 23 min Charges:  OT General Charges $OT Visit: 1 Visit OT Evaluation $OT Eval Moderate Complexity: 1 Mod OT Treatments $Self Care/Home Management : 8-22 mins  Dalphine Handing, MSOT, OTR/L Acute Rehabilitation Services [facility] Office Number: [phone] Pager: [phone]  Dalphine Handing 07/27/2019, 6:13 PM

## 2019-07-27 NOTE — Progress Notes (Signed)
Patient arrived to 4E from the ED. CHG completed and new gown applied. Tele applied and CCMD notified. Pt on box 22. VS stable and NSR on the monitor. Pt oriented to room, bed and call bell. Will continue to monitor.  Willa Frater, RN

## 2019-07-27 NOTE — Evaluation (Signed)
Physical Therapy Evaluation Patient Details Name: Todd [name] MRN: [id number] DOB: [birth year] Today's Date: 07/27/2019   History of Present Illness  64 yo male with onset of SOB and chestpain was admitted, noted AKI, CHF, had recent PNA, elevated troponin, pulm edema and effusion, and cardiomegaly.  PMHx:  CHF, B CEA's, carotid stenosis, smoker, CAD, DM, depression, COPD,   Clinical Impression  Pt was seen for mobility and strength testing, and note some deficits that would be helped with HHPT.  Pt in agreement, and will need to be monitored for sats on room air.  Follow acutely for these needs, and recommend he continue on to outpatient therapy if possible due to his limited help from wife as well.    Follow Up Recommendations Home health PT;Supervision for mobility/OOB    Equipment Recommendations  None recommended by PT    Recommendations for Other Services       Precautions / Restrictions Precautions Precautions: Fall Precaution Comments: monitor O2 sats Restrictions Weight Bearing Restrictions: No      Mobility  Bed Mobility Overal bed mobility: Modified Independent                Transfers Overall transfer level: Needs assistance   Transfers: Sit to/from Stand Sit to Stand: Min guard         General transfer comment: min guard for safety as pt is a little impulsive  Ambulation/Gait Ambulation/Gait assistance: Min guard Gait Distance (Feet): 100 Feet Assistive device: 1 person hand held assist;Quad cane Gait Pattern/deviations: Step-through pattern;Decreased stride length;Wide base of support Gait velocity: reduced Gait velocity interpretation: <1.31 ft/sec, indicative of household ambulator General Gait Details: pt is using quad cane in wrong configuration but has done so for years per his report  Stairs            Wheelchair Mobility    Modified Rankin (Stroke Patients Only)       Balance Overall balance assessment: Needs  assistance Sitting-balance support: Feet supported Sitting balance-Leahy Scale: Good     Standing balance support: During functional activity;Single extremity supported Standing balance-Leahy Scale: Fair                               Pertinent Vitals/Pain Pain Assessment: No/denies pain    Home Living Family/patient expects to be discharged to:: Private residence Living Arrangements: Spouse/significant other Available Help at Discharge: Family;Available 24 hours/day Type of Home: Mobile home Home Access: Level entry   Entrance Stairs-Number of Steps: one small step up Home Layout: One level Home Equipment: Gilmer Mor - quad;Walker - 2 wheels;Cane - single point Additional Comments: pt giving inconsistent history    Prior Function Level of Independence: Independent with assistive device(s)         Comments: uses QC at baseline     Hand Dominance   Dominant Hand: Right    Extremity/Trunk Assessment   Upper Extremity Assessment Upper Extremity Assessment: Defer to OT evaluation    Lower Extremity Assessment Lower Extremity Assessment: Generalized weakness    Cervical / Trunk Assessment Cervical / Trunk Assessment: Normal  Communication   Communication: No difficulties  Cognition                                              General Comments General comments (skin integrity, edema, etc.): O2 sats  were down to 87% with room air, will need to assess his use of supplemental O2    Exercises     Assessment/Plan    PT Assessment Patient needs continued PT services  PT Problem List Decreased strength;Decreased range of motion;Decreased activity tolerance;Decreased balance;Decreased mobility;Decreased coordination;Cardiopulmonary status limiting activity       PT Treatment Interventions DME instruction;Gait training;Functional mobility training;Therapeutic activities;Therapeutic exercise;Balance training;Neuromuscular  re-education;Patient/family education    PT Goals (Current goals can be found in the Care Plan section)  Acute Rehab PT Goals Patient Stated Goal: to get home soon PT Goal Formulation: With patient Time For Goal Achievement: 08/03/19 Potential to Achieve Goals: Good    Frequency Min 3X/week   Barriers to discharge Decreased caregiver support level entrance and home but wife cannot help    Co-evaluation               AM-PAC PT "6 Clicks" Mobility  Outcome Measure Help needed turning from your back to your side while in a flat bed without using bedrails?: None Help needed moving from lying on your back to sitting on the side of a flat bed without using bedrails?: A Little Help needed moving to and from a bed to a chair (including a wheelchair)?: A Little Help needed standing up from a chair using your arms (e.g., wheelchair or bedside chair)?: A Little Help needed to walk in hospital room?: A Little Help needed climbing 3-5 steps with a railing? : A Lot 6 Click Score: 18    End of Session Equipment Utilized During Treatment: Gait belt Activity Tolerance: Patient limited by fatigue;Treatment limited secondary to medical complications (Comment) Patient left: in bed;with call bell/phone within reach;with family/visitor present Nurse Communication: Mobility status PT Visit Diagnosis: Unsteadiness on feet (R26.81);Muscle weakness (generalized) (M62.81);Difficulty in walking, not elsewhere classified (R26.2)    Time: 1202-1226 PT Time Calculation (min) (ACUTE ONLY): 24 min   Charges:   PT Evaluation $PT Eval Moderate Complexity: 1 Mod PT Treatments $Gait Training: 8-22 mins       Ramond Dial 07/27/2019, 5:40 PM  Mee Hives, PT MS Acute Rehab Dept. Number: [facility] [place]

## 2019-07-27 NOTE — Progress Notes (Signed)
  Echocardiogram 2D Echocardiogram has been performed.  Todd [name] 07/27/2019, 2:35 PM

## 2019-07-27 NOTE — Progress Notes (Addendum)
Family Medicine Teaching Service Daily Progress Note Intern Pager: [phone]  Patient name: Todd [name] Medical record number: [id number] Date of birth: [birth year] Age: 63 y.o. Gender: male  Primary Care Provider: Bonnita Nasuti, MD Consultants: Audiology Code Status: Full  Pt Overview and Major Events to Date:  Todd [name] is a 64 y.o. male  presenting with shortness of breath. PMH is significant for HFrEF, COPD, HTN, T2DM, HLD, MDD, chronic back pain, vitamin deficiency, BPH, tobacco use disorder, marijuana use disorder.  Assessment and Plan:  Acute respiratory failure  CHF exacerbation  Patient diuresed ~ 2L yesterday.  Patient received 20 mg and 40 mg IV.  Creatinine 1.5 from 1.42 yesterday.  Weight is down trending.  BNP  >2500.  Chest x-ray consistent with congestive heart failure.  Outpatient ECHO revealed EF 25-30%, G1DD, moderate RV dilation, moderate RAE will redose with 40 mg IV Lasix today. May need left heart cath.  Cardiology consulted in the ED and await additional cardiology recommendations. -Cardiology consulted, appreciate recommendations -Request placed for records to be obtained from PCP -40 mg IV Lasix AM  -Follow-up on outpatient echo already obtained -Strict I's and O's -Daily weights -Continuous pulse ox -Up with assistance -PT/OT eval and treat -Lipid panel ordered   AKI  Baseline 1.1.  On admission 1.4 and today 1.5.  -Monitor closely while on Lasix  -Morning BMPs -Holding home lisinopril at this time -Avoid nephrotoxic agents  - PM BMP today  New-onset HF, HFrEF  Cardiology consulted.  Recent ECHO revealed EF 25-30%, G1DD, moderate RV dilation, moderate RAE, mild MR/TR, and a small posterior pericardial effusion. Per chart review it appears he is on 20 mg Lasix once daily. -See above - Consider ASA   COPD  Diagnosis is in patient's chart but patient reports he has never been told he has COPD.  He is on no medications for this. -Monitor respiratory  status -Consider outpatient PFTs after discharge - encourage smoking cessation  HTN On medication includes lisinopril, Lasix 20 mg.  Blood pressure since arrival have ranged from 118/72-1 36/81.  - diuresis per above - holding lisinopril in setting of AKI  - f/u cardiology recs regarding future change to [facility]    T2DM Fasting glucose 190 this morning.  Patient received 3 units aspart.  Home medications include glyburide-Metformin 2.5-500 mg twice daily, Farxiga 5 mg daily, pioglitazone 45 mg daily.   -Hold home medications -Monitor CBGs -Moderate SSI -Discontinue home pioglitazone at discharge - Follow-up A1c  HLD Home medications include Livalo 4 mg daily.  -Crestor 20 mg daily - Consider daily ASA   MDD Home medications include citalopram 40 mg once daily -Continue home medications -Consider decreasing to 20 mg  Chronic back pain Home medications include oxycodone 15 mg 2 times daily, sometimes TID if pain is worse, gabapentin 300 mg 3 times daily. -Continue home medications as prescribed   BPH Stable.  No home medications -Monitor for urinary hesitance -Continue care outpatient - consider flomax if symptomatic  Tobacco use disorder Daily 2-2-1/2 pack/day smoker.  Patient and his wife are both planning to taper and quit soon. - encourage smoking cessation - nicotine patch while inpatient  Marijuana use disorder Daily marijuana smoker   FEN/GI: heart healthy carb modified diet, replete electrolytes as needed PPx: Lovenox   Disposition: pending medical optimization   Subjective:  Patient has ongoing shortness of breath worse with movement.  Objective: Temp:  [97.7 F (36.5 C)-98.2 F (36.8 C)] 98.2 F (36.8 C) (06/18 0745)  Pulse Rate:  [81-104] 90 (06/18 0745) Resp:  [16-22] 17 (06/18 0745) BP: (117-141)/(72-108) 124/76 (06/18 0745) SpO2:  [90 %-98 %] 93 % (06/18 0745) Weight:  [77 kg-79.4 kg] 77 kg (06/18 0104) Physical Exam: General:  Alert, sitting upright in bed speaking with his wife in no acute distress Cardiovascular: Regular rate and rhythm, no murmurs appreciated, JVP undetectable Respiratory: Rales posterior to the mid lung bilaterally, wearing 1 L nasal cannula Abdomen: Soft, nontender, nondistended Extremities: 2+ pitting edema to the knee, nontender  Laboratory: Recent Labs  Lab 07/26/19 1235  WBC 6.9  HGB 13.9  HCT 45.8  PLT 222   Recent Labs  Lab 07/26/19 1235 07/27/19 0303  NA 136 137  K 4.6 4.1  CL 98 95*  CO2 25 32  BUN 22 22  CREATININE 1.42* 1.50*  CALCIUM 9.2 9.1  GLUCOSE 210* 193*      Imaging/Diagnostic Tests: DG Chest 2 View  Result Date: 07/26/2019 CLINICAL DATA:  Shortness of breath and body aches for 2 weeks. History of congestive heart failure. EXAM: CHEST - 2 VIEW COMPARISON:  PA and lateral chest 05/14/2015 and 07/30/2014. FINDINGS: There is cardiomegaly, pulmonary edema and small effusions. No pneumothorax. No acute bony abnormality. IMPRESSION: Congestive heart failure. Electronically Signed   By: Drusilla Kanner M.D.   On: 07/26/2019 13:19     Katha Cabal, DO 07/27/2019, 8:45 AM PGY-1, [facility] FPTS Intern pager: [phone], text pages welcome

## 2019-07-28 DIAGNOSIS — I5041 Acute combined systolic (congestive) and diastolic (congestive) heart failure: Secondary | ICD-10-CM

## 2019-07-28 DIAGNOSIS — E1165 Type 2 diabetes mellitus with hyperglycemia: Secondary | ICD-10-CM

## 2019-07-28 DIAGNOSIS — N179 Acute kidney failure, unspecified: Secondary | ICD-10-CM

## 2019-07-28 DIAGNOSIS — N289 Disorder of kidney and ureter, unspecified: Secondary | ICD-10-CM

## 2019-07-28 LAB — GLUCOSE, CAPILLARY
Glucose-Capillary: 120 mg/dL — ABNORMAL HIGH (ref 70–99)
Glucose-Capillary: 203 mg/dL — ABNORMAL HIGH (ref 70–99)
Glucose-Capillary: 167 mg/dL — ABNORMAL HIGH (ref 70–99)
Glucose-Capillary: 374 mg/dL — ABNORMAL HIGH (ref 70–99)

## 2019-07-28 LAB — BASIC METABOLIC PANEL
Anion gap: 16 — ABNORMAL HIGH (ref 5–15)
Anion gap: 11 (ref 5–15)
BUN: 21 mg/dL (ref 8–23)
BUN: 24 mg/dL — ABNORMAL HIGH (ref 8–23)
CO2: 28 mmol/L (ref 22–32)
CO2: 33 mmol/L — ABNORMAL HIGH (ref 22–32)
Calcium: 9.1 mg/dL (ref 8.9–10.3)
Calcium: 9 mg/dL (ref 8.9–10.3)
Chloride: 93 mmol/L — ABNORMAL LOW (ref 98–111)
Chloride: 91 mmol/L — ABNORMAL LOW (ref 98–111)
Creatinine, Ser: 1.47 mg/dL — ABNORMAL HIGH (ref 0.61–1.24)
Creatinine, Ser: 1.45 mg/dL — ABNORMAL HIGH (ref 0.61–1.24)
GFR calc Af Amer: 57 mL/min — ABNORMAL LOW (ref >60)
GFR calc Af Amer: 58 mL/min — ABNORMAL LOW (ref >60)
GFR calc non Af Amer: 49 mL/min — ABNORMAL LOW (ref >60)
GFR calc non Af Amer: 50 mL/min — ABNORMAL LOW (ref >60)
Glucose, Bld: 124 mg/dL — ABNORMAL HIGH (ref 70–99)
Glucose, Bld: 150 mg/dL — ABNORMAL HIGH (ref 70–99)
Potassium: 3.9 mmol/L (ref 3.5–5.1)
Potassium: 4.5 mmol/L (ref 3.5–5.1)
Sodium: 137 mmol/L (ref 135–145)
Sodium: 135 mmol/L (ref 135–145)

## 2019-07-28 LAB — CBC
HCT: 46.6 % (ref 39.0–52.0)
Hemoglobin: 14.9 g/dL (ref 13.0–17.0)
MCH: 27.4 pg (ref 26.0–34.0)
MCHC: 32 g/dL (ref 30.0–36.0)
MCV: 85.7 fL (ref 80.0–100.0)
Platelets: 215 10*3/uL (ref 150–400)
RBC: 5.44 MIL/uL (ref 4.22–5.81)
RDW: 15.4 % (ref 11.5–15.5)
WBC: 8.4 10*3/uL (ref 4.0–10.5)
nRBC: 0 % (ref 0.0–0.2)

## 2019-07-28 MED ORDER — CITALOPRAM HYDROBROMIDE 20 MG PO TABS
20.0000 mg | ORAL_TABLET | Freq: Every day | ORAL | Status: DC
  Administered 2019-07-29 – 2019-08-01 (×4): 20 mg via ORAL
  Filled 2019-07-28 (×4): qty 1

## 2019-07-28 MED ORDER — ALBUTEROL SULFATE (2.5 MG/3ML) 0.083% IN NEBU: 3.0000 mL | INHALATION_SOLUTION | Freq: Four times a day (QID) | RESPIRATORY_TRACT | Status: DC | PRN

## 2019-07-28 MED ORDER — ASPIRIN EC 81 MG PO TBEC
81.0000 mg | DELAYED_RELEASE_TABLET | Freq: Every day | ORAL | Status: DC
  Administered 2019-07-28 – 2019-08-01 (×5): 81 mg via ORAL
  Filled 2019-07-28 (×5): qty 1

## 2019-07-28 NOTE — Progress Notes (Signed)
Progress Note  Patient Name: Todd [name] Date of Encounter: 07/28/2019  Primary Cardiologist: [facility]  Subjective   Reports dyspnea improving.  Denies any chest pain.  Net -1.1L on IV Lasix 40 mg x 1 and 80 mg x 1.  Weight down 4 pounds. Renal function stable (creatinine 1.50 >1.33 >1.47).  BP stable at 120/76.  Inpatient Medications    Scheduled Meds: . [START ON 07/29/2019] citalopram  20 mg Oral Daily  . enoxaparin (LOVENOX) injection  40 mg Subcutaneous Q24H  . furosemide  80 mg Intravenous BID  . insulin aspart  0-15 Units Subcutaneous TID WC  . rosuvastatin  20 mg Oral Daily  . sodium chloride flush  3 mL Intravenous Q12H   Continuous Infusions: . sodium chloride     PRN Meds: sodium chloride, acetaminophen, albuterol, oxyCODONE, sodium chloride flush   Vital Signs    Vitals:   07/28/19 0453 07/28/19 0500 07/28/19 0750 07/28/19 1150  BP: 133/76  128/76 120/76  Pulse: 90  92 95  Resp: 16  15 20   Temp: 98.2 F (36.8 C)  98 F (36.7 C) 98 F (36.7 C)  TempSrc: Oral  Oral Oral  SpO2: 98%  99% 96%  Weight:  74.9 kg    Height:        Intake/Output Summary (Last 24 hours) at 07/28/2019 1151 Last data filed at 07/28/2019 1150 Gross per 24 hour  Intake 240 ml  Output 1600 ml  Net -1360 ml   Filed Weights   07/26/19 1639 07/27/19 0104 07/28/19 0500  Weight: 79.4 kg 77 kg 74.9 kg    Telemetry    Sinus rhythm with PVCs, rates 90s  - Personally Reviewed  ECG    No new tracings - Personally Reviewed  Physical Exam   GEN: No acute distress.   Neck: No JVD Cardiac: RRR, no murmurs, rubs, or gallops.  Respiratory: bibasilar crackles GI: NABS, Soft, nontender, non-distended  MS: 2+ LE edema; No deformity. Neuro:  Nonfocal, moving all extremities spontaneously; HOH Psych: Normal affect   Labs    Chemistry Recent Labs  Lab 07/27/19 0303 07/27/19 1659 07/28/19 0316  NA 137 136 137  K 4.1 3.7 3.9  CL 95* 94* 93*  CO2 32 30 28  GLUCOSE 193*  112* 124*  BUN 22 22 21   CREATININE 1.50* 1.33* 1.47*  CALCIUM 9.1 8.9 9.1  GFRNONAA 48* 56* 49*  GFRAA 56* >60 57*  ANIONGAP 10 12 16*     Hematology Recent Labs  Lab 07/26/19 1235 07/28/19 0316  WBC 6.9 8.4  RBC 5.15 5.44  HGB 13.9 14.9  HCT 45.8 46.6  MCV 88.9 85.7  MCH 27.0 27.4  MCHC 30.3 32.0  RDW 15.4 15.4  PLT 222 215    Cardiac EnzymesNo results for input(s): TROPONINI in the last 168 hours. No results for input(s): TROPIPOC in the last 168 hours.   BNP Recent Labs  Lab 07/26/19 1236  BNP 2,568.2*     DDimer No results for input(s): DDIMER in the last 168 hours.   Radiology    DG Chest 2 View  Result Date: 07/26/2019 CLINICAL DATA:  Shortness of breath and body aches for 2 weeks. History of congestive heart failure. EXAM: CHEST - 2 VIEW COMPARISON:  PA and lateral chest 05/14/2015 and 07/30/2014. FINDINGS: There is cardiomegaly, pulmonary edema and small effusions. No pneumothorax. No acute bony abnormality. IMPRESSION: Congestive heart failure. Electronically Signed   By: 07/14/2015 M.D.   On:  07/26/2019 13:19   ECHOCARDIOGRAM COMPLETE  Result Date: 07/27/2019    ECHOCARDIOGRAM REPORT   Patient Name:   Todd [name] Date of Exam: 07/27/2019 Medical Rec #:  [id number]  Height:       63.0 in Accession #:    [id number] Weight:       169.8 lb Date of Birth:  Sep 12, 1955 BSA:          1.803 m Patient Age:    89 years   BP:           114/75 mmHg Patient Gender: M          HR:           99 bpm. Exam Location:  Inpatient Procedure: 2D Echo, 3D Echo, Color Doppler and Cardiac Doppler Indications:    I50.20* Unspecified systolic (congestive) heart failure  History:        Patient has no prior history of Echocardiogram examinations. CHF                 and Systolic heart failure; Risk Factors:Hypertension and                 Dyslipidemia. Patient had echo 4 days ago at another facility.  Sonographer:    [place] Referring Phys: [id number] [place]   1. There is global left ventricular hypokinesis, with distinct regional variations. There is akinesis and hyperechogenicity of the entire inferior septum and inferior wall (consistent with scar in the distribution of the right coronary artery). There is  severe hypokinesis of the apex and mid-apical anterior septum, consistent with severe ischemia or infarction in the distribution of the mid-LAD artery. No intracavitary thrombus is seen. Left ventricular ejection fraction, by estimation, is 20 to 25%. The left ventricle has severely decreased function. The left ventricular internal cavity size was mildly dilated. Left ventricular diastolic parameters are consistent with Grade I diastolic dysfunction (impaired relaxation). There is incessant ectopy that limits diastolic function evaluation.  2. Right ventricular systolic function is moderately reduced. The right ventricular size is normal. There is normal pulmonary artery systolic pressure.  3. Left atrial size was mildly dilated.  4. The pericardial effusion is circumferential. There is no evidence of cardiac tamponade. Large pleural effusion in the left lateral region.  5. The mitral valve is normal in structure. Mild to moderate mitral valve regurgitation.  6. The aortic valve is tricuspid. Aortic valve regurgitation is not visualized. Mild aortic valve sclerosis is present, with no evidence of aortic valve stenosis.  7. The inferior vena cava is dilated in size with >50% respiratory variability, suggesting right atrial pressure of 8 mmHg. FINDINGS  Left Ventricle: There is global left ventricular hypokinesis, with distinct regional variations. There is akinesis and hyperechogenicity of the entire inferior septum and inferior wall (consistent with scar in the distribution of the right coronary artery). There is severe hypokinesis of the apex and mid-apical anterior septum, consistent with severe ischemia or infarction in the distribution of the mid-LAD artery. No  intracavitary thrombus is seen. Left ventricular ejection fraction, by estimation, is 20 to 25%. The left ventricle has severely decreased function. The left ventricle demonstrates regional wall motion abnormalities. The left ventricular internal cavity size was mildly dilated. There is no left ventricular hypertrophy. Left  ventricular diastolic parameters are consistent with Grade I diastolic dysfunction (impaired relaxation). Normal left ventricular filling pressure. Right Ventricle: The right ventricular size is normal. No increase in right ventricular wall thickness. Right ventricular  systolic function is moderately reduced. There is normal pulmonary artery systolic pressure. The tricuspid regurgitant velocity is 1.88 m/s, and with an assumed right atrial pressure of 8 mmHg, the estimated right ventricular systolic pressure is 22.1 mmHg. Left Atrium: Left atrial size was mildly dilated. Right Atrium: Right atrial size was normal in size. Pericardium: A small pericardial effusion is present. The pericardial effusion is circumferential. There is no evidence of cardiac tamponade. Mitral Valve: The mitral valve is normal in structure. Mild mitral annular calcification. Mild to moderate mitral valve regurgitation, with centrally-directed jet. Tricuspid Valve: The tricuspid valve is normal in structure. Tricuspid valve regurgitation is not demonstrated. Aortic Valve: The aortic valve is tricuspid. Aortic valve regurgitation is not visualized. Mild aortic valve sclerosis is present, with no evidence of aortic valve stenosis. Pulmonic Valve: The pulmonic valve was grossly normal. Pulmonic valve regurgitation is not visualized. Aorta: The aortic root and ascending aorta are structurally normal, with no evidence of dilitation. Venous: The inferior vena cava is dilated in size with greater than 50% respiratory variability, suggesting right atrial pressure of 8 mmHg. IAS/Shunts: No atrial level shunt detected by color flow  Doppler. Additional Comments: There is a large pleural effusion in the left lateral region.  LEFT VENTRICLE PLAX 2D LVIDd:         5.90 cm      Diastology LVIDs:         5.30 cm      LV e' lateral:   9.68 cm/s LV PW:         0.80 cm      LV E/e' lateral: 9.8 LV IVS:        1.40 cm LVOT diam:     2.00 cm LV SV:         51 LV SV Index:   28 LVOT Area:     3.14 cm  LV Volumes (MOD) LV vol d, MOD A2C: 190.5 ml LV vol d, MOD A4C: 105.0 ml LV vol s, MOD A2C: 126.0 ml LV vol s, MOD A4C: 98.6 ml LV SV MOD A2C:     64.5 ml LV SV MOD A4C:     105.0 ml LV SV MOD BP:      30.1 ml RIGHT VENTRICLE            IVC RV S prime:     7.28 cm/s  IVC diam: 2.10 cm TAPSE (M-mode): 1.1 cm LEFT ATRIUM             Index       RIGHT ATRIUM           Index LA diam:        3.90 cm 2.16 cm/m  RA Area:     12.30 cm LA Vol (A2C):   52.9 ml 29.33 ml/m RA Volume:   30.10 ml  16.69 ml/m LA Vol (A4C):   35.7 ml 19.80 ml/m LA Biplane Vol: 42.4 ml 23.51 ml/m  AORTIC VALVE LVOT Vmax:   109.00 cm/s LVOT Vmean:  67.500 cm/s LVOT VTI:    0.161 m  AORTA Ao Root diam: 3.20 cm Ao Asc diam:  2.80 cm MITRAL VALVE                TRICUSPID VALVE MV Area (PHT): 4.19 cm     TR Peak grad:   14.1 mmHg MV Decel Time: 181 msec     TR Vmax:        188.00 cm/s MV E velocity: 95.10  cm/s MV A velocity: 119.00 cm/s  SHUNTS MV E/A ratio:  0.80         Systemic VTI:  0.16 m                             Systemic Diam: 2.00 cm Mihai Croitoru MD Electronically signed by Thurmon Fair MD Signature Date/Time: 07/27/2019/3:38:30 PM    Final     Cardiac Studies   Recent echo done 07/23/19: reviewed outpatient source echo EF 25-30%, G1DD, global hypokinesis, global hypokinesis Moderate RV dilation with normal systolic function Moderate RAE Mild MR/TR Small posterior pericardial effusion - Findings are new from previous echo 07/2018 when EF was 60-65%.   Patient Profile     64 y.o. male with PMH of recently diagnosed CHF (no details - echo done at outside facility),  HTN, HLD, carotid artery stenosis s/p bilateral CEAs, DM type 2, COPD, depression and tobacco abuse.   Assessment & Plan    1. Acute combined systolic and diastolic CHF: patient presented with SOB, orthopnea, and LE edema.  TTE shows 20-25% with global hypokinesis with akinesis of entire inferior/inferoseptal walls.   These findings are new compared to 07/2018 when EF was 60-65%. No prior ischemic evaluation. EKG with anteroseptal/anerior Q waves. BNP elevated to 2500. HsTrop 115 x2. Diffuse crackles on lung exam and 2+ LE edema.  Suspicious are quite high for ischemic etiology given carotid artery disease, HTN, HLD, DM type 2, and tobacco abuse. Needs to be medically optimized prior to ischemic eval this admission. Suspect intermittent chest pain will improve with diuresis. - Continue IV Lasix 80 mg twice daily - Continue to monitor strict I&Os and daily weights - Continue to monitor electrolytes and replete to maintain K >4, Mg >2 - Anticipate initiation of BBlocker once improved from acute decompensation - Will re-evaluate ability to add ARB/Entresto/spironolactone pending Cr response to diuresis  2. HTN: BP intermittently elevated but overall stable - Continue close monitoring   3. HLD: LDL 148 this admission; not on statin. Simvastatin listed on allergy list - Will trial crestor for risk factor modifications - Consider referral to lipid clinic if intolerant to crestor  4. DM type 2: A1C 8.6 05/2019. Favor discontinuation of actos given carotid artery disease and high suspicions for CAD. He is on farxiga and glipizide-metformin, as well.  - Continue management per primary team - Hopeful to resume farxiga on discharge if Cr stable given CHF and high suspicion for CAD  5. AoCKD stage 2: Cr up to 1.5 this admission; baseline appears to be 1.1. Unfortunately he needs more aggressive diuresis - Continue to monitor closely - Avoid nephrotoxic agents.  6. Tobacco abuse: needs to quit - Continue  to encourage cessation  For questions or updates, please contact [facility] Please consult [email or web address] for contact info under Cardiology/STEMI.      Signed, Little Ishikawa, MD  07/28/2019, 11:51 AM   [phone]

## 2019-07-28 NOTE — Progress Notes (Addendum)
Family Medicine Teaching Service Daily Progress Note Intern Pager: [phone]  Patient name: Todd [name] Medical record number: [id number] Date of birth: [birth year] Age: 64 y.o. Gender: male  Primary Care Provider: Galvin Proffer, MD Consultants: Cardiology Code Status: Full  Pt Overview and Major Events to Date:  Todd [name] is a 64 y.o. male  presenting with shortness of breath. PMH is significant for HFrEF, COPD, HTN, T2DM, HLD, MDD, chronic back pain, vitamin deficiency, BPH, tobacco use disorder, marijuana use disorder.  Assessment and Plan:  Acute respiratory failure  HFrEF exacerbation  Patient diuresed 1.5 L yesterday.  Weight continues to downtrend.  Lower extremity edema improving.  Creatinine stable with aggressive diuresis 80 mg IV twice daily.  Echo this admission EF 20-25% with grade 1 diastolic dysfunction, severe hypokinesis at the apex as well as scarring in the RCA distribution.  Cardiology plans on performing a left and right heart cath on 07/30/2019. -Cardiology consulted, appreciate recommendations -80 mg IV Lasix  BID  -Strict I's and O's -Daily weights -Continuous pulse ox -PT/OT: Home health PT/OT -Lipid panel ordered -ASA   AKI  Baseline 1.1.  On admission 1.4 and today 1.47.  -Monitor closely while on Lasix  -Morning BMPs -Holding home lisinopril at this time -Avoid nephrotoxic agents  - Follow up PM BMP    COPD  Diagnosis is in patient's chart but patient reports he has never been told he has COPD.  He is on no medications for this. -Monitor respiratory status -Consider outpatient PFTs after discharge - encourage smoking cessation - Albuterol PRN   HTN Normotensive.  On medication includes lisinopril, Lasix 20 mg.   - diuresis per above - holding lisinopril in setting of AKI  - f/u cardiology recs regarding future change to [facility]    T2DM Fasting glucose 120 this morning.  A1c 9 this admission.  Patient received 5 units aspart.  Home  medications include glyburide-Metformin 2.5-500 mg twice daily, Farxiga 5 mg daily, pioglitazone 45 mg daily.   -Hold home medications -Monitor CBGs -Moderate SSI -Discontinue home pioglitazone, glyburide at discharge  HLD Home medications include Livalo 4 mg daily.  -Crestor 20 mg daily - Consider daily ASA after heart cath on 6/21  MDD Home medications include citalopram 40 mg once daily -Continue home medications -Decreased to 20 mg  Chronic back pain Home medications include oxycodone 15 mg 2 times daily, sometimes TID if pain is worse, gabapentin 300 mg 3 times daily. -Continue home medications as prescribed   BPH Stable.  No home medications -Monitor for urinary hesitance -Continue care outpatient - consider flomax if symptomatic  Tobacco use disorder Daily 2-2-1/2 pack/day smoker.  Patient and his wife are both planning to taper and quit soon. - encourage smoking cessation - nicotine patch while inpatient  Marijuana use disorder Daily marijuana smoker   FEN/GI: heart healthy carb modified diet, replete electrolytes as needed PPx: Lovenox   Disposition: pending medical optimization   Subjective:  Patient has ongoing shortness of breath worse with movement.  Objective: Temp:  [97.7 F (36.5 C)-98.2 F (36.8 C)] 98 F (36.7 C) (06/19 0750) Pulse Rate:  [90-100] 92 (06/19 0750) Resp:  [15-19] 15 (06/19 0750) BP: (112-139)/(76-86) 128/76 (06/19 0750) SpO2:  [91 %-99 %] 99 % (06/19 0750) Weight:  [74.9 kg] 74.9 kg (06/19 0500) Physical Exam: General: Anxious, alert, tearful sitting upright in bed Cardiovascular: Regular rate and rhythm, no murmurs, no JVP appreciated Respiratory: Posterior rales bilaterally at lung, satting well on room air,  Abdomen: Soft, nontender, nondistended Extremities: 2+ pitting edema to the shin, nontender  Laboratory: Recent Labs  Lab 07/26/19 1235 07/28/19 0316  WBC 6.9 8.4  HGB 13.9 14.9  HCT 45.8 46.6  PLT 222 215    Recent Labs  Lab 07/27/19 0303 07/27/19 1659 07/28/19 0316  NA 137 136 137  K 4.1 3.7 3.9  CL 95* 94* 93*  CO2 32 30 28  BUN 22 22 21   CREATININE 1.50* 1.33* 1.47*  CALCIUM 9.1 8.9 9.1  GLUCOSE 193* 112* 124*      Imaging/Diagnostic Tests: ECHOCARDIOGRAM COMPLETE  Result Date: 07/27/2019    ECHOCARDIOGRAM REPORT   Patient Name:   DAMONTA [name] Date of Exam: 07/27/2019 Medical Rec #:  07/29/2019  Height:       63.0 in Accession #:    [id number] Weight:       169.8 lb Date of Birth:  01-Feb-1956 BSA:          1.803 m Patient Age:    63 years   BP:           114/75 mmHg Patient Gender: M          HR:           99 bpm. Exam Location:  Inpatient Procedure: 2D Echo, 3D Echo, Color Doppler and Cardiac Doppler Indications:    I50.20* Unspecified systolic (congestive) heart failure  History:        Patient has no prior history of Echocardiogram examinations. CHF                 and Systolic heart failure; Risk Factors:Hypertension and                 Dyslipidemia. Patient had echo 4 days ago at another facility.  Sonographer:    12/09/1955 [facility] Referring Phys: Sheralyn Boatman [id number] IMPRESSIONS  1. There is global left ventricular hypokinesis, with distinct regional variations. There is akinesis and hyperechogenicity of the entire inferior septum and inferior wall (consistent with scar in the distribution of the right coronary artery). There is  severe hypokinesis of the apex and mid-apical anterior septum, consistent with severe ischemia or infarction in the distribution of the mid-LAD artery. No intracavitary thrombus is seen. Left ventricular ejection fraction, by estimation, is 20 to 25%. The left ventricle has severely decreased function. The left ventricular internal cavity size was mildly dilated. Left ventricular diastolic parameters are consistent with Grade I diastolic dysfunction (impaired relaxation). There is incessant ectopy that limits diastolic function evaluation.  2. Right ventricular  systolic function is moderately reduced. The right ventricular size is normal. There is normal pulmonary artery systolic pressure.  3. Left atrial size was mildly dilated.  4. The pericardial effusion is circumferential. There is no evidence of cardiac tamponade. Large pleural effusion in the left lateral region.  5. The mitral valve is normal in structure. Mild to moderate mitral valve regurgitation.  6. The aortic valve is tricuspid. Aortic valve regurgitation is not visualized. Mild aortic valve sclerosis is present, with no evidence of aortic valve stenosis.  7. The inferior vena cava is dilated in size with >50% respiratory variability, suggesting right atrial pressure of 8 mmHg. FINDINGS  Left Ventricle: There is global left ventricular hypokinesis, with distinct regional variations. There is akinesis and hyperechogenicity of the entire inferior septum and inferior wall (consistent with scar in the distribution of the right coronary artery). There is severe hypokinesis of the apex and mid-apical anterior septum, consistent  with severe ischemia or infarction in the distribution of the mid-LAD artery. No intracavitary thrombus is seen. Left ventricular ejection fraction, by estimation, is 20 to 25%. The left ventricle has severely decreased function. The left ventricle demonstrates regional wall motion abnormalities. The left ventricular internal cavity size was mildly dilated. There is no left ventricular hypertrophy. Left  ventricular diastolic parameters are consistent with Grade I diastolic dysfunction (impaired relaxation). Normal left ventricular filling pressure. Right Ventricle: The right ventricular size is normal. No increase in right ventricular wall thickness. Right ventricular systolic function is moderately reduced. There is normal pulmonary artery systolic pressure. The tricuspid regurgitant velocity is 1.88 m/s, and with an assumed right atrial pressure of 8 mmHg, the estimated right ventricular  systolic pressure is 22.1 mmHg. Left Atrium: Left atrial size was mildly dilated. Right Atrium: Right atrial size was normal in size. Pericardium: A small pericardial effusion is present. The pericardial effusion is circumferential. There is no evidence of cardiac tamponade. Mitral Valve: The mitral valve is normal in structure. Mild mitral annular calcification. Mild to moderate mitral valve regurgitation, with centrally-directed jet. Tricuspid Valve: The tricuspid valve is normal in structure. Tricuspid valve regurgitation is not demonstrated. Aortic Valve: The aortic valve is tricuspid. Aortic valve regurgitation is not visualized. Mild aortic valve sclerosis is present, with no evidence of aortic valve stenosis. Pulmonic Valve: The pulmonic valve was grossly normal. Pulmonic valve regurgitation is not visualized. Aorta: The aortic root and ascending aorta are structurally normal, with no evidence of dilitation. Venous: The inferior vena cava is dilated in size with greater than 50% respiratory variability, suggesting right atrial pressure of 8 mmHg. IAS/Shunts: No atrial level shunt detected by color flow Doppler. Additional Comments: There is a large pleural effusion in the left lateral region.  LEFT VENTRICLE PLAX 2D LVIDd:         5.90 cm      Diastology LVIDs:         5.30 cm      LV e' lateral:   9.68 cm/s LV PW:         0.80 cm      LV E/e' lateral: 9.8 LV IVS:        1.40 cm LVOT diam:     2.00 cm LV SV:         51 LV SV Index:   28 LVOT Area:     3.14 cm  LV Volumes (MOD) LV vol d, MOD A2C: 190.5 ml LV vol d, MOD A4C: 105.0 ml LV vol s, MOD A2C: 126.0 ml LV vol s, MOD A4C: 98.6 ml LV SV MOD A2C:     64.5 ml LV SV MOD A4C:     105.0 ml LV SV MOD BP:      30.1 ml RIGHT VENTRICLE            IVC RV S prime:     7.28 cm/s  IVC diam: 2.10 cm TAPSE (M-mode): 1.1 cm LEFT ATRIUM             Index       RIGHT ATRIUM           Index LA diam:        3.90 cm 2.16 cm/m  RA Area:     12.30 cm LA Vol (A2C):   52.9 ml  29.33 ml/m RA Volume:   30.10 ml  16.69 ml/m LA Vol (A4C):   35.7 ml 19.80 ml/m LA Biplane Vol: 42.4 ml 23.51  ml/m  AORTIC VALVE LVOT Vmax:   109.00 cm/s LVOT Vmean:  67.500 cm/s LVOT VTI:    0.161 m  AORTA Ao Root diam: 3.20 cm Ao Asc diam:  2.80 cm MITRAL VALVE                TRICUSPID VALVE MV Area (PHT): 4.19 cm     TR Peak grad:   14.1 mmHg MV Decel Time: 181 msec     TR Vmax:        188.00 cm/s MV E velocity: 95.10 cm/s MV A velocity: 119.00 cm/s  SHUNTS MV E/A ratio:  0.80         Systemic VTI:  0.16 m                             Systemic Diam: 2.00 cm Sanda Klein MD Electronically signed by Sanda Klein MD Signature Date/Time: 07/27/2019/3:38:30 PM    Final      Lyndee Hensen, DO 07/28/2019, 11:30 AM PGY-1, [facility] Intern pager: [phone], text pages welcome

## 2019-07-29 DIAGNOSIS — E1165 Type 2 diabetes mellitus with hyperglycemia: Secondary | ICD-10-CM | POA: Diagnosis present

## 2019-07-29 DIAGNOSIS — I5021 Acute systolic (congestive) heart failure: Secondary | ICD-10-CM

## 2019-07-29 DIAGNOSIS — E782 Mixed hyperlipidemia: Secondary | ICD-10-CM

## 2019-07-29 DIAGNOSIS — E039 Hypothyroidism, unspecified: Secondary | ICD-10-CM

## 2019-07-29 DIAGNOSIS — F331 Major depressive disorder, recurrent, moderate: Secondary | ICD-10-CM

## 2019-07-29 DIAGNOSIS — N179 Acute kidney failure, unspecified: Secondary | ICD-10-CM

## 2019-07-29 DIAGNOSIS — G894 Chronic pain syndrome: Secondary | ICD-10-CM

## 2019-07-29 LAB — CBC
HCT: 45.3 % (ref 39.0–52.0)
Hemoglobin: 14.3 g/dL (ref 13.0–17.0)
MCH: 26.6 pg (ref 26.0–34.0)
MCHC: 31.6 g/dL (ref 30.0–36.0)
MCV: 84.4 fL (ref 80.0–100.0)
Platelets: 192 10*3/uL (ref 150–400)
RBC: 5.37 MIL/uL (ref 4.22–5.81)
RDW: 15 % (ref 11.5–15.5)
WBC: 7.5 10*3/uL (ref 4.0–10.5)
nRBC: 0 % (ref 0.0–0.2)

## 2019-07-29 LAB — MAGNESIUM
Magnesium: 1.2 mg/dL — ABNORMAL LOW (ref 1.7–2.4)
Magnesium: 2.1 mg/dL (ref 1.7–2.4)

## 2019-07-29 LAB — BASIC METABOLIC PANEL
Anion gap: 13 (ref 5–15)
BUN: 26 mg/dL — ABNORMAL HIGH (ref 8–23)
CO2: 31 mmol/L (ref 22–32)
Calcium: 8.6 mg/dL — ABNORMAL LOW (ref 8.9–10.3)
Chloride: 89 mmol/L — ABNORMAL LOW (ref 98–111)
Creatinine, Ser: 1.65 mg/dL — ABNORMAL HIGH (ref 0.61–1.24)
GFR calc Af Amer: 50 mL/min — ABNORMAL LOW (ref >60)
GFR calc non Af Amer: 43 mL/min — ABNORMAL LOW (ref >60)
Glucose, Bld: 194 mg/dL — ABNORMAL HIGH (ref 70–99)
Potassium: 3.5 mmol/L (ref 3.5–5.1)
Sodium: 133 mmol/L — ABNORMAL LOW (ref 135–145)

## 2019-07-29 LAB — GLUCOSE, CAPILLARY
Glucose-Capillary: 186 mg/dL — ABNORMAL HIGH (ref 70–99)
Glucose-Capillary: 216 mg/dL — ABNORMAL HIGH (ref 70–99)
Glucose-Capillary: 160 mg/dL — ABNORMAL HIGH (ref 70–99)
Glucose-Capillary: 235 mg/dL — ABNORMAL HIGH (ref 70–99)

## 2019-07-29 LAB — PHOSPHORUS: Phosphorus: 4 mg/dL (ref 2.5–4.6)

## 2019-07-29 MED ORDER — NICOTINE 21 MG/24HR TD PT24
21.0000 mg | MEDICATED_PATCH | Freq: Every day | TRANSDERMAL | Status: DC
  Administered 2019-08-01: 21 mg via TRANSDERMAL
  Filled 2019-07-29 (×5): qty 1

## 2019-07-29 MED ORDER — POTASSIUM CHLORIDE CRYS ER 20 MEQ PO TBCR
40.0000 meq | EXTENDED_RELEASE_TABLET | ORAL | Status: AC
  Administered 2019-07-29 (×2): 40 meq via ORAL
  Filled 2019-07-29 (×2): qty 2

## 2019-07-29 MED ORDER — MAGNESIUM SULFATE 2 GM/50ML IV SOLN: 2.0000 g | Freq: Once | INTRAVENOUS | Status: DC

## 2019-07-29 MED ORDER — FUROSEMIDE 10 MG/ML IJ SOLN
40.0000 mg | Freq: Once | INTRAMUSCULAR | Status: AC
  Administered 2019-07-29: 40 mg via INTRAVENOUS
  Filled 2019-07-29: qty 4

## 2019-07-29 MED ORDER — POTASSIUM CHLORIDE CRYS ER 20 MEQ PO TBCR: 40.0000 meq | EXTENDED_RELEASE_TABLET | ORAL | Status: DC

## 2019-07-29 MED ORDER — UMECLIDINIUM BROMIDE 62.5 MCG/INH IN AEPB
1.0000 | INHALATION_SPRAY | Freq: Every day | RESPIRATORY_TRACT | Status: DC
  Administered 2019-07-30 – 2019-08-01 (×3): 1 via RESPIRATORY_TRACT
  Filled 2019-07-29: qty 7

## 2019-07-29 MED ORDER — MAGNESIUM SULFATE 2 GM/50ML IV SOLN
2.0000 g | Freq: Once | INTRAVENOUS | Status: AC
  Administered 2019-07-29: 2 g via INTRAVENOUS
  Filled 2019-07-29: qty 50

## 2019-07-29 NOTE — Progress Notes (Addendum)
FMTS Attending Daily Note: [name], MD  Team Pager [phone] Pager [phone]  I have seen and examined this patient, reviewed their chart. I have discussed this patient with the resident. I agree with the resident's findings, assessment and care plan. Gently IV diuresis X1 today- slight bump in creatinine. Patient and wife very concerned about creatinine (daughter had DM and ESRD).   Frequent PVC on telemetry, monitor electrolytes closely.   Started LAMA for COPD.    Family Medicine Teaching Service Daily Progress Note Intern Pager: [phone]  Patient name: Todd [name] Medical record number: [id number] Date of birth: 01/24/1956 Age: 64 y.o. Gender: male  Primary Care Provider: Bonnita Nasuti, MD Consultants: Cardiology Code Status: Full  Pt Overview and Major Events to Date:  Todd [name] is a 64 y.o. male  presenting with shortness of breath. PMH is significant for HFrEF, COPD, HTN, T2DM, HLD, MDD, chronic back pain, vitamin deficiency, BPH, tobacco use disorder, marijuana use disorder.  Assessment and Plan:  Acute respiratory failure  HFrEF exacerbation - improving Patient 5L since admission, 0.5 L in last 24hrs.  Lower extremity edema improving.  Creatinine worsening to 1.65 with aggressive diuresis 80 mg IV twice daily.  Breathing comfortably on 2L [place], no home O2.  Echo this admission EF 20-25% with grade 1 diastolic dysfunction, severe hypokinesis at the apex as well as scarring in the RCA distribution.  Cardiology plans on performing a left and right heart cath on 07/30/2019. -Cardiology consulted, appreciate recommendations -hold 80 mg IV Lasix  BID (cardiology may continue but want them to see sCr) -Strict I's and O's -Daily weights -Continuous pulse ox -PT/OT: Home health PT/OT -ASA - K goal >4 (giving 40kdurx2 6/20) , mag goal >2 (ordered 2g IV 6/20)   AKI -worsening Baseline 1.1.  On admission 1.4 and today 1.65.  -Monitor closely while on Lasix  -Morning  BMPs -Holding home lisinopril at this time -Avoid nephrotoxic agents  - Follow up PM BMP   COPD -stable Diagnosis is in patient's chart but patient reports he has never been told he has COPD.  He is on no medications for this. -Monitor respiratory status -Consider outpatient PFTs after discharge - encourage smoking cessation - Albuterol PRN   HTN-stable Normotensive.  On medication includes lisinopril, Lasix 20 mg.   - diuresis per above - holding lisinopril in setting of AKI  - f/u cardiology recs regarding future change to Entresto    T2DM-stable A1c 9 this admission.  Patient received 5 units aspart.  Home medications include glyburide-Metformin 2.5-500 mg twice daily, Farxiga 5 mg daily, pioglitazone 45 mg daily.   -Hold home medications -Monitor CBGs -Moderate SSI -Discontinue home pioglitazone, glyburide at discharge  HLD Home medications include Livalo 4 mg daily.  -trial Crestor 20 mg daily (consider lipid clinic referral if not tolerated) -ASA 81  MDD-stable Home medications include citalopram 40 mg once daily -Continue home medications -Decreased to 20 mg   Chronic back pain-stable Home medications include oxycodone 15 mg 2 times daily, sometimes TID if pain is worse, gabapentin 300 mg 3 times daily. -Continue home medications as prescribed   BPH Stable.  No home medications -Monitor for urinary hesitance -Continue care outpatient - consider flomax if symptomatic  Tobacco use disorder Daily 2-2-1/2 pack/day smoker.  Patient and his wife are both planning to taper and quit soon. - encourage smoking cessation - nicotine patch while inpatient  Marijuana use disorder Daily marijuana smoker  FEN/GI: heart healthy carb modified diet, replete  electrolytes as needed PPx: Lovenox   Disposition: Pending cath on Monday  Subjective:  Patient understands plan for cath, we discussed his ejection fraction from the echo and while concerned he is  comfortable to work for the plan with cardiology.  We also discussed the balancing act of managing his renal function in light of his cardiac problems.  He is breathing comfortably on nasal cannula at rest and has no pain complaints or pressing request at this time  Objective: Temp:  [98 F (36.7 C)-98.4 F (36.9 C)] 98 F (36.7 C) (06/20 0421) Pulse Rate:  [87-95] 87 (06/20 0319) Resp:  [12-20] 16 (06/20 0319) BP: (102-128)/(61-76) 102/61 (06/20 0421) SpO2:  [90 %-99 %] 96 % (06/20 0319) Weight:  [72.8 kg] 72.8 kg (06/20 0421) Physical Exam: General: Alert, pleasant and calm, no distress Cardiovascular: Regular rate and rhythm, 2+ pitting edema to the lower thigh, no murmur to my exam Respiratory: Crackles at bases, 2 L nasal cannula without increased work of breathing, no cough, good movement throughout Extremities: 2+ pitting edema to the mid thigh (could be higher today due to positioning in bed)  Laboratory: Recent Labs  Lab 07/26/19 1235 07/28/19 0316 07/29/19 0330  WBC 6.9 8.4 7.5  HGB 13.9 14.9 14.3  HCT 45.8 46.6 45.3  PLT 222 215 192   Recent Labs  Lab 07/28/19 0316 07/28/19 1453 07/29/19 0330  NA 137 135 133*  K 3.9 4.5 3.5  CL 93* 91* 89*  CO2 28 33* 31  BUN 21 24* 26*  CREATININE 1.47* 1.45* 1.65*  CALCIUM 9.1 9.0 8.6*  GLUCOSE 124* 150* 194*   Mag 1.2   Imaging/Diagnostic Tests: No results found.   Marthenia Rolling, DO 07/29/2019, 6:30 AM PGY-3, [facility]  FPTS Intern pager: [phone], text pages welcome

## 2019-07-29 NOTE — Progress Notes (Addendum)
Progress Note  Patient Name: Todd [name] Date of Encounter: 07/29/2019  Primary Cardiologist: [facility]  Subjective   Reports dyspnea improving.  Denies any chest pain.  Net -2.2L on IV Lasix 80 mg x 2 yesterday.  Weight down 5 pounds, (15 lbs from admission). Worsening renal function today (creatinine 1.50 >1.33 >1.47 >1.65).  BP stable at 120/0.  Inpatient Medications    Scheduled Meds:  aspirin EC  81 mg Oral Daily   citalopram  20 mg Oral Daily   enoxaparin (LOVENOX) injection  40 mg Subcutaneous Q24H   insulin aspart  0-15 Units Subcutaneous TID WC   nicotine  21 mg Transdermal Daily   rosuvastatin  20 mg Oral Daily   sodium chloride flush  3 mL Intravenous Q12H   umeclidinium bromide  1 puff Inhalation Daily   Continuous Infusions:  sodium chloride     PRN Meds: sodium chloride, acetaminophen, albuterol, oxyCODONE, sodium chloride flush   Vital Signs    Vitals:   07/29/19 0042 07/29/19 0319 07/29/19 0421 07/29/19 0956  BP: 114/68  102/61 120/70  Pulse: 92 87  90  Resp: 17 16  19   Temp: 98.4 F (36.9 C)  98 F (36.7 C) 98.4 F (36.9 C)  TempSrc: Oral  Oral Oral  SpO2: 95% 96%  98%  Weight:   72.8 kg   Height:        Intake/Output Summary (Last 24 hours) at 07/29/2019 1116 Last data filed at 07/29/2019 0956 Gross per 24 hour  Intake 720 ml  Output 2910 ml  Net -2190 ml   Filed Weights   07/27/19 0104 07/28/19 0500 07/29/19 0421  Weight: 77 kg 74.9 kg 72.8 kg    Telemetry    Sinus rhythm with PVCs, runs of bigeminy  - Personally Reviewed  ECG    No new tracings - Personally Reviewed  Physical Exam   GEN: No acute distress.   Neck: No JVD Cardiac: RRR, no murmurs, rubs, or gallops.  Respiratory: bibasilar crackles GI: NABS, Soft, nontender, non-distended  MS: 2+ LE edema; No deformity. Neuro:  Nonfocal, moving all extremities spontaneously; HOH Psych: Normal affect   Labs    Chemistry Recent Labs  Lab 07/28/19 0316  07/28/19 1453 07/29/19 0330  NA 137 135 133*  K 3.9 4.5 3.5  CL 93* 91* 89*  CO2 28 33* 31  GLUCOSE 124* 150* 194*  BUN 21 24* 26*  CREATININE 1.47* 1.45* 1.65*  CALCIUM 9.1 9.0 8.6*  GFRNONAA 49* 50* 43*  GFRAA 57* 58* 50*  ANIONGAP 16* 11 13     Hematology Recent Labs  Lab 07/26/19 1235 07/28/19 0316 07/29/19 0330  WBC 6.9 8.4 7.5  RBC 5.15 5.44 5.37  HGB 13.9 14.9 14.3  HCT 45.8 46.6 45.3  MCV 88.9 85.7 84.4  MCH 27.0 27.4 26.6  MCHC 30.3 32.0 31.6  RDW 15.4 15.4 15.0  PLT 222 215 192    Cardiac EnzymesNo results for input(s): TROPONINI in the last 168 hours. No results for input(s): TROPIPOC in the last 168 hours.   BNP Recent Labs  Lab 07/26/19 1236  BNP 2,568.2*     DDimer No results for input(s): DDIMER in the last 168 hours.   Radiology    ECHOCARDIOGRAM COMPLETE  Result Date: 07/27/2019    ECHOCARDIOGRAM REPORT   Patient Name:   Todd [name] Date of Exam: 07/27/2019 Medical Rec #:  [id number]  Height:       63.0 in Accession #:  [id number] Weight:       169.8 lb Date of Birth:  [birth year] BSA:          1.803 m Patient Age:    64 years   BP:           114/75 mmHg Patient Gender: M          HR:           99 bpm. Exam Location:  Inpatient Procedure: 2D Echo, 3D Echo, Color Doppler and Cardiac Doppler Indications:    I50.20* Unspecified systolic (congestive) heart failure  History:        Patient has no prior history of Echocardiogram examinations. CHF                 and Systolic heart failure; Risk Factors:Hypertension and                 Dyslipidemia. Patient had echo 4 days ago at another facility.  Sonographer:    Sheralyn Boatman RDCS Referring Phys: [id number] Beatriz Stallion IMPRESSIONS  1. There is global left ventricular hypokinesis, with distinct regional variations. There is akinesis and hyperechogenicity of the entire inferior septum and inferior wall (consistent with scar in the distribution of the right coronary artery). There is  severe hypokinesis of the  apex and mid-apical anterior septum, consistent with severe ischemia or infarction in the distribution of the mid-LAD artery. No intracavitary thrombus is seen. Left ventricular ejection fraction, by estimation, is 20 to 25%. The left ventricle has severely decreased function. The left ventricular internal cavity size was mildly dilated. Left ventricular diastolic parameters are consistent with Grade I diastolic dysfunction (impaired relaxation). There is incessant ectopy that limits diastolic function evaluation.  2. Right ventricular systolic function is moderately reduced. The right ventricular size is normal. There is normal pulmonary artery systolic pressure.  3. Left atrial size was mildly dilated.  4. The pericardial effusion is circumferential. There is no evidence of cardiac tamponade. Large pleural effusion in the left lateral region.  5. The mitral valve is normal in structure. Mild to moderate mitral valve regurgitation.  6. The aortic valve is tricuspid. Aortic valve regurgitation is not visualized. Mild aortic valve sclerosis is present, with no evidence of aortic valve stenosis.  7. The inferior vena cava is dilated in size with >50% respiratory variability, suggesting right atrial pressure of 8 mmHg. FINDINGS  Left Ventricle: There is global left ventricular hypokinesis, with distinct regional variations. There is akinesis and hyperechogenicity of the entire inferior septum and inferior wall (consistent with scar in the distribution of the right coronary artery). There is severe hypokinesis of the apex and mid-apical anterior septum, consistent with severe ischemia or infarction in the distribution of the mid-LAD artery. No intracavitary thrombus is seen. Left ventricular ejection fraction, by estimation, is 20 to 25%. The left ventricle has severely decreased function. The left ventricle demonstrates regional wall motion abnormalities. The left ventricular internal cavity size was mildly dilated.  There is no left ventricular hypertrophy. Left  ventricular diastolic parameters are consistent with Grade I diastolic dysfunction (impaired relaxation). Normal left ventricular filling pressure. Right Ventricle: The right ventricular size is normal. No increase in right ventricular wall thickness. Right ventricular systolic function is moderately reduced. There is normal pulmonary artery systolic pressure. The tricuspid regurgitant velocity is 1.88 m/s, and with an assumed right atrial pressure of 8 mmHg, the estimated right ventricular systolic pressure is 22.1 mmHg. Left Atrium: Left atrial size was mildly dilated. Right  Atrium: Right atrial size was normal in size. Pericardium: A small pericardial effusion is present. The pericardial effusion is circumferential. There is no evidence of cardiac tamponade. Mitral Valve: The mitral valve is normal in structure. Mild mitral annular calcification. Mild to moderate mitral valve regurgitation, with centrally-directed jet. Tricuspid Valve: The tricuspid valve is normal in structure. Tricuspid valve regurgitation is not demonstrated. Aortic Valve: The aortic valve is tricuspid. Aortic valve regurgitation is not visualized. Mild aortic valve sclerosis is present, with no evidence of aortic valve stenosis. Pulmonic Valve: The pulmonic valve was grossly normal. Pulmonic valve regurgitation is not visualized. Aorta: The aortic root and ascending aorta are structurally normal, with no evidence of dilitation. Venous: The inferior vena cava is dilated in size with greater than 50% respiratory variability, suggesting right atrial pressure of 8 mmHg. IAS/Shunts: No atrial level shunt detected by color flow Doppler. Additional Comments: There is a large pleural effusion in the left lateral region.  LEFT VENTRICLE PLAX 2D LVIDd:         5.90 cm      Diastology LVIDs:         5.30 cm      LV e' lateral:   9.68 cm/s LV PW:         0.80 cm      LV E/e' lateral: 9.8 LV IVS:        1.40  cm LVOT diam:     2.00 cm LV SV:         51 LV SV Index:   28 LVOT Area:     3.14 cm  LV Volumes (MOD) LV vol d, MOD A2C: 190.5 ml LV vol d, MOD A4C: 105.0 ml LV vol s, MOD A2C: 126.0 ml LV vol s, MOD A4C: 98.6 ml LV SV MOD A2C:     64.5 ml LV SV MOD A4C:     105.0 ml LV SV MOD BP:      30.1 ml RIGHT VENTRICLE            IVC RV S prime:     7.28 cm/s  IVC diam: 2.10 cm TAPSE (M-mode): 1.1 cm LEFT ATRIUM             Index       RIGHT ATRIUM           Index LA diam:        3.90 cm 2.16 cm/m  RA Area:     12.30 cm LA Vol (A2C):   52.9 ml 29.33 ml/m RA Volume:   30.10 ml  16.69 ml/m LA Vol (A4C):   35.7 ml 19.80 ml/m LA Biplane Vol: 42.4 ml 23.51 ml/m  AORTIC VALVE LVOT Vmax:   109.00 cm/s LVOT Vmean:  67.500 cm/s LVOT VTI:    0.161 m  AORTA Ao Root diam: 3.20 cm Ao Asc diam:  2.80 cm MITRAL VALVE                TRICUSPID VALVE MV Area (PHT): 4.19 cm     TR Peak grad:   14.1 mmHg MV Decel Time: 181 msec     TR Vmax:        188.00 cm/s MV E velocity: 95.10 cm/s MV A velocity: 119.00 cm/s  SHUNTS MV E/A ratio:  0.80         Systemic VTI:  0.16 m  Systemic Diam: 2.00 cm Rachelle Hora Croitoru MD Electronically signed by Thurmon Fair MD Signature Date/Time: 07/27/2019/3:38:30 PM    Final     Cardiac Studies   Recent echo done 07/23/19: reviewed outpatient source echo EF 25-30%, G1DD, global hypokinesis, global hypokinesis Moderate RV dilation with normal systolic function Moderate RAE Mild MR/TR Small posterior pericardial effusion - Findings are new from previous echo 07/2018 when EF was 60-65%.   Patient Profile     64 y.o. male with PMH of recently diagnosed CHF (no details - echo done at outside facility), HTN, HLD, carotid artery stenosis s/p bilateral CEAs, DM type 2, COPD, depression and tobacco abuse.   Assessment & Plan    1. Acute combined systolic and diastolic CHF: patient presented with SOB, orthopnea, and LE edema.  TTE shows 20-25% with global hypokinesis with  akinesis of entire inferior/inferoseptal walls.   These findings are new compared to 07/2018 when EF was 60-65%. No prior ischemic evaluation. EKG with anteroseptal/anerior Q waves. BNP elevated to 2500. HsTrop 115 x2. Diffuse crackles on lung exam and 2+ LE edema.  Suspicious are quite high for ischemic etiology given carotid artery disease, HTN, HLD, DM type 2, and tobacco abuse. Needs to be medically optimized prior to ischemic eval this admission.  - Mild bump in creatinine with diuresis yesterday.  Remains volume overloaded on exam.  Will diurese more conservatively today, decrease dose to IV lasix 40 mg x1 today - Had planned LHC/RHC tomorrow.  Will hold off for now and make sure renal function stable before proceeding.  Patient and wife are very worried about risk of cath on renal function, as their daughter developed ESRD from contrast-induced nephropathy.  - Continue to monitor strict I&Os and daily weights - Continue to monitor electrolytes and replete to maintain K >4, Mg >2 - Anticipate initiation of BBlocker once improved from acute decompensation - Will re-evaluate ability to add ARB/Entresto/spironolactone pending Cr response to diuresis  2. HTN: BP intermittently elevated but overall stable - Continue close monitoring   3. HLD: LDL 148 this admission; not on statin. Simvastatin listed on allergy list - Will trial crestor for risk factor modifications - Consider referral to lipid clinic if intolerant to crestor  4. DM type 2: A1C 8.6 05/2019. Favor discontinuation of actos given carotid artery disease and high suspicions for CAD. He is on farxiga and glipizide-metformin, as well.  - Continue management per primary team - Hopeful to resume farxiga on discharge if Cr stable given CHF and high suspicion for CAD  5. AoCKD stage 2: Cr up to 1.65 this admission; baseline appears to be 1.1. Unfortunately he needs more aggressive diuresis - Continue to monitor closely - Avoid nephrotoxic  agents.  6. Tobacco abuse: needs to quit - Continue to encourage cessation  For questions or updates, please contact [facility] Please consult [email or web address] for contact info under Cardiology/STEMI.      Signed, Little Ishikawa, MD  07/29/2019, 11:16 AM   [phone]

## 2019-07-30 ENCOUNTER — Encounter (HOSPITAL_COMMUNITY)
Admission: RE | Disposition: A | Discharge status: Nursing Home (Includes ICF) | Payer: Self-pay | Source: Home / Self Care | Attending: Thoracic Surgery (Cardiothoracic Vascular Surgery)

## 2019-07-30 DIAGNOSIS — I2510 Atherosclerotic heart disease of native coronary artery without angina pectoris: Secondary | ICD-10-CM

## 2019-07-30 HISTORY — PX: RIGHT/LEFT HEART CATH AND CORONARY ANGIOGRAPHY: CATH118266

## 2019-07-30 LAB — BASIC METABOLIC PANEL
Anion gap: 11 (ref 5–15)
BUN: 28 mg/dL — ABNORMAL HIGH (ref 8–23)
CO2: 32 mmol/L (ref 22–32)
Calcium: 8.6 mg/dL — ABNORMAL LOW (ref 8.9–10.3)
Chloride: 92 mmol/L — ABNORMAL LOW (ref 98–111)
Creatinine, Ser: 1.67 mg/dL — ABNORMAL HIGH (ref 0.61–1.24)
GFR calc Af Amer: 49 mL/min — ABNORMAL LOW (ref >60)
GFR calc non Af Amer: 42 mL/min — ABNORMAL LOW (ref >60)
Glucose, Bld: 241 mg/dL — ABNORMAL HIGH (ref 70–99)
Potassium: 4.1 mmol/L (ref 3.5–5.1)
Sodium: 135 mmol/L (ref 135–145)

## 2019-07-30 LAB — POCT I-STAT EG7
Acid-Base Excess: 5 mmol/L — ABNORMAL HIGH (ref 0.0–2.0)
Acid-Base Excess: 7 mmol/L — ABNORMAL HIGH (ref 0.0–2.0)
Bicarbonate: 32.5 mmol/L — ABNORMAL HIGH (ref 20.0–28.0)
Bicarbonate: 33.4 mmol/L — ABNORMAL HIGH (ref 20.0–28.0)
Calcium, Ion: 1.11 mmol/L — ABNORMAL LOW (ref 1.15–1.40)
Calcium, Ion: 1.03 mmol/L — ABNORMAL LOW (ref 1.15–1.40)
HCT: 44 % (ref 39.0–52.0)
HCT: 43 % (ref 39.0–52.0)
Hemoglobin: 15 g/dL (ref 13.0–17.0)
Hemoglobin: 14.6 g/dL (ref 13.0–17.0)
O2 Saturation: 68 %
O2 Saturation: 67 %
Potassium: 3.7 mmol/L (ref 3.5–5.1)
Potassium: 3.6 mmol/L (ref 3.5–5.1)
Sodium: 132 mmol/L — ABNORMAL LOW (ref 135–145)
Sodium: 138 mmol/L (ref 135–145)
TCO2: 34 mmol/L — ABNORMAL HIGH (ref 22–32)
TCO2: 35 mmol/L — ABNORMAL HIGH (ref 22–32)
pCO2, Ven: 56.3 mmHg (ref 44.0–60.0)
pCO2, Ven: 53.5 mmHg (ref 44.0–60.0)
pH, Ven: 7.369 (ref 7.250–7.430)
pH, Ven: 7.404 (ref 7.250–7.430)
pO2, Ven: 37 mmHg (ref 32.0–45.0)
pO2, Ven: 35 mmHg (ref 32.0–45.0)

## 2019-07-30 LAB — POCT I-STAT 7, (LYTES, BLD GAS, ICA,H+H)
Acid-Base Excess: 7 mmol/L — ABNORMAL HIGH (ref 0.0–2.0)
Bicarbonate: 32.4 mmol/L — ABNORMAL HIGH (ref 20.0–28.0)
Calcium, Ion: 1.08 mmol/L — ABNORMAL LOW (ref 1.15–1.40)
HCT: 44 % (ref 39.0–52.0)
Hemoglobin: 15 g/dL (ref 13.0–17.0)
O2 Saturation: 96 %
Potassium: 3.8 mmol/L (ref 3.5–5.1)
Sodium: 136 mmol/L (ref 135–145)
TCO2: 34 mmol/L — ABNORMAL HIGH (ref 22–32)
pCO2 arterial: 49.2 mmHg — ABNORMAL HIGH (ref 32.0–48.0)
pH, Arterial: 7.427 (ref 7.350–7.450)
pO2, Arterial: 83 mmHg (ref 83.0–108.0)

## 2019-07-30 LAB — CBC
HCT: 43 % (ref 39.0–52.0)
Hemoglobin: 13.5 g/dL (ref 13.0–17.0)
MCH: 27.1 pg (ref 26.0–34.0)
MCHC: 31.4 g/dL (ref 30.0–36.0)
MCV: 86.2 fL (ref 80.0–100.0)
Platelets: 184 10*3/uL (ref 150–400)
RBC: 4.99 MIL/uL (ref 4.22–5.81)
RDW: 14.9 % (ref 11.5–15.5)
WBC: 7 10*3/uL (ref 4.0–10.5)
nRBC: 0 % (ref 0.0–0.2)

## 2019-07-30 LAB — GLUCOSE, CAPILLARY
Glucose-Capillary: 210 mg/dL — ABNORMAL HIGH (ref 70–99)
Glucose-Capillary: 128 mg/dL — ABNORMAL HIGH (ref 70–99)
Glucose-Capillary: 105 mg/dL — ABNORMAL HIGH (ref 70–99)
Glucose-Capillary: 226 mg/dL — ABNORMAL HIGH (ref 70–99)

## 2019-07-30 LAB — MAGNESIUM: Magnesium: 1.8 mg/dL (ref 1.7–2.4)

## 2019-07-30 SURGERY — RIGHT/LEFT HEART CATH AND CORONARY ANGIOGRAPHY
Anesthesia: LOCAL

## 2019-07-30 MED ORDER — SODIUM CHLORIDE 0.9% FLUSH
3.0000 mL | Freq: Two times a day (BID) | INTRAVENOUS | Status: DC
  Administered 2019-07-30 – 2019-08-01 (×5): 3 mL via INTRAVENOUS

## 2019-07-30 MED ORDER — ENOXAPARIN SODIUM 40 MG/0.4ML ~~LOC~~ SOLN: 40.0000 mg | SUBCUTANEOUS | Status: DC

## 2019-07-30 MED ORDER — HEPARIN (PORCINE) IN NACL 1000-0.9 UT/500ML-% IV SOLN
INTRAVENOUS | Status: DC | PRN
  Administered 2019-07-30 (×2): 500 mL

## 2019-07-30 MED ORDER — SODIUM CHLORIDE 0.9% FLUSH: 3.0000 mL | INTRAVENOUS | Status: DC | PRN

## 2019-07-30 MED ORDER — SODIUM CHLORIDE 0.9 % IV SOLN: INTRAVENOUS | Status: AC

## 2019-07-30 MED ORDER — LIDOCAINE HCL (PF) 1 % IJ SOLN
INTRAMUSCULAR | Status: DC | PRN
  Administered 2019-07-30: 5 mL

## 2019-07-30 MED ORDER — VERAPAMIL HCL 2.5 MG/ML IV SOLN
INTRAVENOUS | Status: DC | PRN
  Administered 2019-07-30: 10 mL via INTRA_ARTERIAL

## 2019-07-30 MED ORDER — FENTANYL CITRATE (PF) 100 MCG/2ML IJ SOLN
INTRAMUSCULAR | Status: AC
  Filled 2019-07-30: qty 2

## 2019-07-30 MED ORDER — SODIUM CHLORIDE 0.9 % IV SOLN: 250.0000 mL | INTRAVENOUS | Status: DC | PRN

## 2019-07-30 MED ORDER — HEPARIN SODIUM (PORCINE) 1000 UNIT/ML IJ SOLN
INTRAMUSCULAR | Status: DC | PRN
  Administered 2019-07-30: 4000 [IU] via INTRAVENOUS

## 2019-07-30 MED ORDER — LABETALOL HCL 5 MG/ML IV SOLN: 10.0000 mg | INTRAVENOUS | Status: AC | PRN

## 2019-07-30 MED ORDER — MIDAZOLAM HCL 2 MG/2ML IJ SOLN
INTRAMUSCULAR | Status: AC
  Filled 2019-07-30: qty 2

## 2019-07-30 MED ORDER — MIDAZOLAM HCL 2 MG/2ML IJ SOLN
INTRAMUSCULAR | Status: DC | PRN
  Administered 2019-07-30: 2 mg via INTRAVENOUS

## 2019-07-30 MED ORDER — IOHEXOL 350 MG/ML SOLN
INTRAVENOUS | Status: DC | PRN
  Administered 2019-07-30: 30 mL

## 2019-07-30 MED ORDER — VERAPAMIL HCL 2.5 MG/ML IV SOLN
INTRAVENOUS | Status: AC
  Filled 2019-07-30: qty 2

## 2019-07-30 MED ORDER — HEPARIN SODIUM (PORCINE) 1000 UNIT/ML IJ SOLN
INTRAMUSCULAR | Status: AC
  Filled 2019-07-30: qty 1

## 2019-07-30 MED ORDER — SODIUM CHLORIDE 0.9 % IV SOLN: INTRAVENOUS | Status: DC

## 2019-07-30 MED ORDER — FENTANYL CITRATE (PF) 100 MCG/2ML IJ SOLN
INTRAMUSCULAR | Status: DC | PRN
  Administered 2019-07-30: 50 ug via INTRAVENOUS

## 2019-07-30 MED ORDER — HYDRALAZINE HCL 20 MG/ML IJ SOLN: 10.0000 mg | INTRAMUSCULAR | Status: AC | PRN

## 2019-07-30 MED ORDER — MAGNESIUM SULFATE 2 GM/50ML IV SOLN
2.0000 g | Freq: Once | INTRAVENOUS | Status: AC
  Administered 2019-07-30: 2 g via INTRAVENOUS
  Filled 2019-07-30: qty 50

## 2019-07-30 MED ORDER — LIDOCAINE HCL (PF) 1 % IJ SOLN
INTRAMUSCULAR | Status: AC
  Filled 2019-07-30: qty 30

## 2019-07-30 MED ORDER — SODIUM CHLORIDE 0.9% FLUSH
3.0000 mL | Freq: Two times a day (BID) | INTRAVENOUS | Status: DC
  Administered 2019-07-30 – 2019-08-01 (×4): 3 mL via INTRAVENOUS

## 2019-07-30 MED ORDER — INSULIN GLARGINE 100 UNIT/ML ~~LOC~~ SOLN
5.0000 [IU] | Freq: Every day | SUBCUTANEOUS | Status: DC
  Administered 2019-07-30 – 2019-08-01 (×3): 5 [IU] via SUBCUTANEOUS
  Filled 2019-07-30 (×3): qty 0.05

## 2019-07-30 MED ORDER — HEPARIN (PORCINE) IN NACL 1000-0.9 UT/500ML-% IV SOLN
INTRAVENOUS | Status: AC
  Filled 2019-07-30: qty 1000

## 2019-07-30 SURGICAL SUPPLY — 13 items
CATH 5FR JL3.5 JR4 ANG PIG MP (CATHETERS) ×1 IMPLANT
CATH SWAN GANZ 7F STRAIGHT (CATHETERS) ×1 IMPLANT
DEVICE RAD COMP TR BAND LRG (VASCULAR PRODUCTS) ×1 IMPLANT
GLIDESHEATH SLEND SS 6F .021 (SHEATH) ×1 IMPLANT
GLIDESHEATH SLENDER 7FR .021G (SHEATH) ×1 IMPLANT
GUIDEWIRE INQWIRE 1.5J.035X260 (WIRE) IMPLANT
INQWIRE 1.5J .035X260CM (WIRE) ×2
KIT HEART LEFT (KITS) ×2 IMPLANT
PACK CARDIAC CATHETERIZATION (CUSTOM PROCEDURE TRAY) ×2 IMPLANT
SHEATH GLIDE SLENDER 4/5FR (SHEATH) ×1 IMPLANT
TRANSDUCER W/STOPCOCK (MISCELLANEOUS) ×2 IMPLANT
TUBING CIL FLEX 10 FLL-RA (TUBING) ×2 IMPLANT
WIRE EMERALD 3MM-J .025X260CM (WIRE) ×1 IMPLANT

## 2019-07-30 NOTE — Progress Notes (Signed)
Pt received from cath lab. VSS and set to sequence. TR band in place. Call bell in reach. Will continue to monitor.  Hazle Nordmann, RN

## 2019-07-30 NOTE — Hospital Course (Addendum)
Acute Respiratory Failure  HFrEF  Patient presented with 1 month history of leg swelling and more recent worsening of shortness of breath. He continued to have worsening in shortness of breath and DOE, no longer able to walk to the bathroom, and presented to the ED.  Patient used 2L [place] intermittently throughout his admission. In the ED patient had CBC which was within normal limits, BMP significant for glucose of 210, CR 1.42, BNP of >2500, hstroponin's trended flat.  EKG with anteroseptal/anerior Q waves. Chest x-ray was obtained which was consistent with CHF. ECHO this admission with EF 20-25%, G1DD, moderate RV dilation, moderate RAE, mild MR/TR, and a small posterior pericardial effusion. Patient was aggressively diuresed with IV Lasix. Cardiology evaluated patient and recommended left/right heart catheterization.  On ***6/22, heart cath showed ***    AKI  On admission creatine was 1.4 and reached *** at maximum. Patient creatine was monitored with aggressive diuresis and Lasix was held when appropriate. Creatine on the day of discharge was ***.   T2DM  A1c this admission was 9.0. CBGs were monitored throughout admission. Pioglitazone was discontinued

## 2019-07-30 NOTE — Progress Notes (Addendum)
Family Medicine Teaching Service Daily Progress Note Intern Pager: [phone]  Patient name: Todd [name] Medical record number: [id number] Date of birth: 1955/08/07 Age: 63 y.o. Gender: male  Primary Care Provider: Bonnita Nasuti, MD Consultants: Cardiology Code Status: Full  Pt Overview and Major Events to Date:  Todd [name] is a 64 y.o. male  presenting with shortness of breath. PMH is significant for HFrEF, COPD, HTN, T2DM, HLD, MDD, chronic back pain, vitamin deficiency, BPH, tobacco use disorder, marijuana use disorder.  Assessment and Plan:  Acute respiratory failure  HFrEF exacerbation - improving Patient 5L since admission, 0.5 L in last 24hrs.  Lower extremity edema improving.  Creatinine worsening to 1.65 with aggressive diuresis 80 mg IV twice daily.  Breathing comfortably on 2L [place], no home O2.  Echo this admission EF 20-25% with grade 1 diastolic dysfunction, severe hypokinesis at the apex as well as scarring in the RCA distribution.  Left and right heart cath scheduled for today at 10 AM. -Cardiology consulted, appreciate recommendations -Holding IV Lasix at this time given creatinine and heart cath today.  May need to resume after heart cath per cardiology -Strict I's and O's -Daily weights -Continuous pulse ox -PT/OT: Home health PT/OT -ASA - K goal >4 (giving 40kdurx2 6/20) , mag goal >2 (ordered 2g IV 6/20)   AKI -worsening Baseline 1.1.  On admission 1.4 and 1.67 from 1.65 yesterday -Holding Lasix today -Follow-up on morning BMPs -Continue to hold home lisinopril -Avoid nephrotoxic agents    COPD -stable Diagnosis is in patient's chart but patient reports he has never been told he has COPD.  He is on no medications for this. -Monitor respiratory status -Consider outpatient PFTs after discharge - encourage smoking cessation - Albuterol PRN  -Respiratory is following  HTN-stable Normotensive.  On medication includes lisinopril, Lasix 20 mg.   - diuresis per  above - holding lisinopril in setting of AKI  - f/u cardiology recs regarding future change to Entresto    T2DM-stable A1c 9 this admission.  Patient received 5 units aspart.  Home medications include glyburide-Metformin 2.5-500 mg twice daily, Farxiga 5 mg daily, pioglitazone 45 mg daily.   -Hold home medications -Starting Lantus 5 units nightly while in the hospital -Monitor CBGs -Moderate SSI -Discontinue home pioglitazone, glyburide at discharge  HLD Home medications include Livalo 4 mg daily.  -trial Crestor 20 mg daily (consider lipid clinic referral if not tolerated) -ASA 81  MDD-stable Home medications include citalopram 40 mg once daily -Continue home medications -Decreased to 20 mg   Chronic back pain-stable Home medications include oxycodone 15 mg 2 times daily, sometimes TID if pain is worse, gabapentin 300 mg 3 times daily. -Continue home medications as prescribed   BPH Stable.  No home medications -Monitor for urinary hesitance -Continue care outpatient - consider flomax if symptomatic  Tobacco use disorder Daily 2-2-1/2 pack/day smoker.  Patient and his wife are both planning to taper and quit soon. - encourage smoking cessation - nicotine patch while inpatient  Marijuana use disorder Daily marijuana smoker  FEN/GI: heart healthy carb modified diet, replete electrolytes as needed PPx: Lovenox  Disposition: Pending cath today 6/21  Subjective:  Patient reports he is doing well this morning and his leg swelling is greatly improved.  Work of breathing is also improved.  Per scheduling it appears that he will have his right heart cath today and he is pleased with that.  He is hoping to go home this afternoon but I informed him it  is most likely he will stay until at least tomorrow  Objective: Temp:  [98 F (36.7 C)-98.4 F (36.9 C)] 98 F (36.7 C) (06/21 0027) Pulse Rate:  [72-98] 98 (06/21 0027) Resp:  [14-20] 16 (06/21 0027) BP:  (110-133)/(69-83) 133/83 (06/21 0027) SpO2:  [91 %-100 %] 91 % (06/21 0027) Physical Exam: General: Resting comfortably, no acute distress Cardiovascular: Regular rate and rhythm, 2+ pitting edema below the knees, edema is improved from the thighs. Respiratory: Crackles in lung bases but improved from initial evaluation.  Patient has good air movement with no increased work of breathing.  He is on room air when I assess him. Extremities: 2+ pitting edema which starts below the knees.  Improved from previous evaluation which showed 3+ above the knees.  Laboratory: Recent Labs  Lab 07/28/19 0316 07/29/19 0330 07/30/19 0303  WBC 8.4 7.5 7.0  HGB 14.9 14.3 13.5  HCT 46.6 45.3 43.0  PLT 215 192 184   Recent Labs  Lab 07/28/19 1453 07/29/19 0330 07/30/19 0303  NA 135 133* 135  K 4.5 3.5 4.1  CL 91* 89* 92*  CO2 33* 31 32  BUN 24* 26* 28*  CREATININE 1.45* 1.65* 1.67*  CALCIUM 9.0 8.6* 8.6*  GLUCOSE 150* 194* 241*   Mag 1.2   Imaging/Diagnostic Tests: No results found.   Derrel Nip, MD 07/30/2019, 6:04 AM PGY-3, [facility]  FPTS Intern pager: [phone], text pages welcome

## 2019-07-30 NOTE — Progress Notes (Signed)
Mobility Specialist - Progress Note   07/30/19 1531  Mobility  Activity Refused mobility  Mobility performed by Mobility specialist    Pt refused due to not being able to take his pain meds.  Mamie Levers Mobility Specialist Mobility Specialist Phone: [phone]

## 2019-07-30 NOTE — H&P (View-Only) (Signed)
Progress Note  Patient Name: Todd [name] Date of Encounter: 07/30/2019  [facility] Cardiologist: New to [employer] - Lives in [place] so will likely follow-up in our clinic out there.  Subjective   No acute overnight events. No chest pain. Breathing and edema improving.  Inpatient Medications    Scheduled Meds: . aspirin EC  81 mg Oral Daily  . citalopram  20 mg Oral Daily  . enoxaparin (LOVENOX) injection  40 mg Subcutaneous Q24H  . insulin aspart  0-15 Units Subcutaneous TID WC  . nicotine  21 mg Transdermal Daily  . rosuvastatin  20 mg Oral Daily  . sodium chloride flush  3 mL Intravenous Q12H  . umeclidinium bromide  1 puff Inhalation Daily   Continuous Infusions: . sodium chloride    . sodium chloride    . sodium chloride 10 mL/hr at 07/30/19 0634  . magnesium sulfate bolus IVPB 2 g (07/30/19 0801)   PRN Meds: sodium chloride, sodium chloride, acetaminophen, albuterol, oxyCODONE, sodium chloride flush   Vital Signs    Vitals:   07/29/19 2021 07/30/19 0027 07/30/19 0605 07/30/19 0800  BP: 115/69 133/83 137/76 116/71  Pulse: 95 98 93 87  Resp: 20 16 15 17   Temp: 98.1 F (36.7 C) 98 F (36.7 C) 98.1 F (36.7 C) 98 F (36.7 C)  TempSrc: Oral Oral Oral Oral  SpO2: 95% 91% 100% 94%  Weight:   73.4 kg   Height:        Intake/Output Summary (Last 24 hours) at 07/30/2019 0812 Last data filed at 07/30/2019 0645 Gross per 24 hour  Intake 480 ml  Output 1030 ml  Net -550 ml   Last 3 Weights 07/30/2019 07/29/2019 07/28/2019  Weight (lbs) 161 lb 12.8 oz 160 lb 9.6 oz 165 lb 1.6 oz  Weight (kg) 73.392 kg 72.848 kg 74.889 kg      Telemetry    Normal sinus rhythm, rates 80's to 90's with PVCs (sometimes in trigeminy pattern) - Personally Reviewed  ECG    No new ECG tracing today. - Personally Reviewed  Physical Exam   GEN: No acute distress. Hard of hearing. Neck: No JVD Cardiac: RRR. No murmurs, rubs, or gallops.  Respiratory: No increased work of  breathing. Mild crackles in bilateral bases.  GI: Soft, non-distended, and non-tender. MS: 1-2+ edema of bilateral lower extremities (worse around ankles). No deformity. Neuro:  No focal deficits. Psych: Normal affect. Responds appropriately.  Labs    High Sensitivity Troponin:   Recent Labs  Lab 07/26/19 1711 07/26/19 2016  TROPONINIHS 115* 115*      Chemistry Recent Labs  Lab 07/28/19 1453 07/29/19 0330 07/30/19 0303  NA 135 133* 135  K 4.5 3.5 4.1  CL 91* 89* 92*  CO2 33* 31 32  GLUCOSE 150* 194* 241*  BUN 24* 26* 28*  CREATININE 1.45* 1.65* 1.67*  CALCIUM 9.0 8.6* 8.6*  GFRNONAA 50* 43* 42*  GFRAA 58* 50* 49*  ANIONGAP 11 13 11      Hematology Recent Labs  Lab 07/28/19 0316 07/29/19 0330 07/30/19 0303  WBC 8.4 7.5 7.0  RBC 5.44 5.37 4.99  HGB 14.9 14.3 13.5  HCT 46.6 45.3 43.0  MCV 85.7 84.4 86.2  MCH 27.4 26.6 27.1  MCHC 32.0 31.6 31.4  RDW 15.4 15.0 14.9  PLT 215 192 184    BNP Recent Labs  Lab 07/26/19 1236  BNP 2,568.2*     DDimer No results for input(s): DDIMER in the last 168  hours.   Radiology    No results found.  Cardiac Studies   Echocardiogram 07/27/2019: Impressions: 1. There is global left ventricular hypokinesis, with distinct regional  variations. There is akinesis and hyperechogenicity of the entire inferior  septum and inferior wall (consistent with scar in the distribution of the  right coronary artery). There is  severe hypokinesis of the apex and mid-apical anterior septum, consistent  with severe ischemia or infarction in the distribution of the mid-LAD  artery. No intracavitary thrombus is seen. Left ventricular ejection  fraction, by estimation, is 20 to 25%.  The left ventricle has severely decreased function. The left ventricular  internal cavity size was mildly dilated. Left ventricular diastolic  parameters are consistent with Grade I diastolic dysfunction (impaired  relaxation). There is incessant ectopy    that limits diastolic function evaluation.  2. Right ventricular systolic function is moderately reduced. The right  ventricular size is normal. There is normal pulmonary artery systolic  pressure.  3. Left atrial size was mildly dilated.  4. The pericardial effusion is circumferential. There is no evidence of  cardiac tamponade. Large pleural effusion in the left lateral region.  5. The mitral valve is normal in structure. Mild to moderate mitral valve  regurgitation.  6. The aortic valve is tricuspid. Aortic valve regurgitation is not  visualized. Mild aortic valve sclerosis is present, with no evidence of  aortic valve stenosis.  7. The inferior vena cava is dilated in size with >50% respiratory  variability, suggesting right atrial pressure of 8 mmHg.  Patient Profile     63 y.o. male with a history of recently diagnosed CHF at outside hospital, carotid artery stenosis s/p bilateral CEA, hypertension, hyperlipidemia, type 2 diabetes mellitus, COPD, tobacco abuse, CKD stage 3, and depression who is being seen for acute on chronic combined CHF at the request of Dr. Tegeler.  Assessment & Plan    Acute on Chronic Combined CHF - BNP elevated at 2,568.  - Chest x-ray consistent with CHF with cardiomegaly, pulmonary edema, and small effusion.  - Echo showed LVEF of 20-25% with global hypokinesis with akinesis and hyperechogenicity of the entire inferior septum and inferior wall (consistent with scar in the distribution of the RCA) as well as severe hypokinesis of the apex and mid-apical anterior septum (consitent with severe ischemia or infraction int he distribution of the mid LAD). - Initially started on IV Lasix 80mg twice daily but had rise in creatinine; therefore, decreased to 40mg yesterday. Documented urinary output of 1.030 L in the past 24 hours and net negative 5 L since admission. Weight down 14 lbs since admission. Renal function stable. - Still has some bibasilar  crackles and lower extremity edema on exam so likely does need more diuresis; however, will hold off for now until after cath. - Can likely add beta-blocker after cath. - No ACEi/ARB/ARNI for now given renal function.  -  Plan is for right/left heart catheterization today. The patient understands that risks include but are not limited to stroke (1 in 1000), death (1 in 1000), kidney failure [usually temporary] (1 in 500), bleeding (1 in 200), allergic reaction [possibly serious] (1 in 200), and agrees to proceed. Patient and wife are worried about contrast-induced nephropathy because their daughter developed ESRD after receiving contrast dye. Discussed this with them today and they are willing to proceed. Recommend limiting contrast if possible.   Hypertension - BP well controlled without any antihypertensives.  - Home Lisinopril held due to AKI. -   Continue to monitor.  Hyperlipidemia - Lipid panel this admission: Total cholesterol 203, Triglycerides 106, HDL 34, LDL 148.  - Not on statin at home. Simvastatin listed as allergy. - Started on Crestor 20mg  daily this admission. If intolerant, can consider referral to lipid clinic.  Type 2 Diabetes Mellitus - Hemoglobin A1c 8.6 in 05/2019.  - On Farxiga and Glipizide-Metformin at home.  - Hopefully can resume 02-12-2002 on discharge if creatinine stable given CHF and high suspicion for CAD.  AKI on CKD Stage III - Creatinine 1.65 today. Baseline reportedly around 1.1.  - Continue to avoid Nephrotoxic agents.   Tobacco/Marijuana Abuse - Patient smoking 2-2.5 packs per day prior to admission. Also reported marijuana use. - Discussed importance of complete cessation.   For questions or updates, please contact [facility] Please consult [email or web address] for contact info under        Signed, [place], PA-C  07/30/2019, 8:12 AM

## 2019-07-30 NOTE — Progress Notes (Signed)
Occupational Therapy Treatment Patient Details Name: Todd [name] MRN: [id number] DOB: 1955/05/20 Today's Date: 07/30/2019    History of present illness 64 yo male with onset of SOB and chestpain was admitted, noted AKI, CHF, had recent PNA, elevated troponin, pulm edema and effusion, and cardiomegaly.  PMHx:  CHF, B CEA's, carotid stenosis, smoker, CAD, DM, depression, COPD,    OT comments  Patient continues to make steady progress towards goals in skilled OT session. Patient's session encompassed energy conservation techniques in order to increase overall activity tolerance. Pt with spouse and able to verbalize various energy conservation strategies with handout provided for reference upon discharge home. Discharge remains appropriate at this time, will continue to follow acutely.     Follow Up Recommendations  Home health OT    Equipment Recommendations  3 in 1 bedside commode;Tub/shower bench    Recommendations for Other Services      Precautions / Restrictions Precautions Precautions: Fall Precaution Comments: monitor O2 sats Restrictions Weight Bearing Restrictions: No       Mobility Bed Mobility               General bed mobility comments: EOB for session  Transfers                      Balance                                           ADL either performed or assessed with clinical judgement   ADL Overall ADL's : Needs assistance/impaired                         Toilet Transfer: Set up Toilet Transfer Details (indicate cue type and reason): Sitting EOB to urinate         Functional mobility during ADLs: Min guard General ADL Comments: Session focus on energy conservation techniques to promote safe discharge     Vision Baseline Vision/History: No visual deficits     Perception     Praxis      Cognition Arousal/Alertness: Awake/alert Behavior During Therapy: WFL for tasks assessed/performed Overall  Cognitive Status: Within Functional Limits for tasks assessed                                          Exercises     Shoulder Instructions       General Comments      Pertinent Vitals/ Pain       Pain Assessment: No/denies pain  Home Living                                          Prior Functioning/Environment              Frequency  Min 2X/week        Progress Toward Goals  OT Goals(current goals can now be found in the care plan section)  Progress towards OT goals: Progressing toward goals  Acute Rehab OT Goals Patient Stated Goal: to get home soon OT Goal Formulation: With patient Time For Goal Achievement: 08/10/19  Plan Discharge plan remains appropriate    Co-evaluation  AM-PAC OT "6 Clicks" Daily Activity     Outcome Measure   Help from another person eating meals?: None Help from another person taking care of personal grooming?: A Little Help from another person toileting, which includes using toliet, bedpan, or urinal?: A Little Help from another person bathing (including washing, rinsing, drying)?: A Little Help from another person to put on and taking off regular upper body clothing?: None Help from another person to put on and taking off regular lower body clothing?: A Little 6 Click Score: 20    End of Session    OT Visit Diagnosis: Unsteadiness on feet (R26.81);Other abnormalities of gait and mobility (R26.89);Muscle weakness (generalized) (M62.81)   Activity Tolerance Patient tolerated treatment well   Patient Left in bed;with call bell/phone within reach;with family/visitor present   Nurse Communication Mobility status        Time: 1101-1111 OT Time Calculation (min): 10 min  Charges: OT General Charges $OT Visit: 1 Visit OT Treatments $Self Care/Home Management : 8-22 mins  Pollyann Glen E. Idelia Caudell, COTA/L Acute Rehabilitation  Services [phone]   Cherlyn Cushing 07/30/2019, 11:18 AM

## 2019-07-30 NOTE — Research (Signed)
PHDEV Informed Consent   Subject Name: Todd [name]  Subject met inclusion and exclusion criteria.  The informed consent form, study requirements and expectations were reviewed with the subject and questions and concerns were addressed prior to the signing of the consent form.  The subject verbalized understanding of the trail requirements.  The subject agreed to participate in the [facility] trial and signed the informed consent.  The informed consent was obtained prior to performance of any protocol-specific procedures for the subject.  A copy of the signed informed consent was given to the subject and a copy was placed in the subject's medical record.  Philemon Kingdom D 07/30/2019, 0924 am

## 2019-07-30 NOTE — Progress Notes (Addendum)
Progress Note  Patient Name: Todd [name] Date of Encounter: 07/30/2019  [facility] Cardiologist: New to [employer] - Lives in [place] so will likely follow-up in our clinic out there.  Subjective   No acute overnight events. No chest pain. Breathing and edema improving.  Inpatient Medications    Scheduled Meds: . aspirin EC  81 mg Oral Daily  . citalopram  20 mg Oral Daily  . enoxaparin (LOVENOX) injection  40 mg Subcutaneous Q24H  . insulin aspart  0-15 Units Subcutaneous TID WC  . nicotine  21 mg Transdermal Daily  . rosuvastatin  20 mg Oral Daily  . sodium chloride flush  3 mL Intravenous Q12H  . umeclidinium bromide  1 puff Inhalation Daily   Continuous Infusions: . sodium chloride    . sodium chloride    . sodium chloride 10 mL/hr at 07/30/19 0634  . magnesium sulfate bolus IVPB 2 g (07/30/19 0801)   PRN Meds: sodium chloride, sodium chloride, acetaminophen, albuterol, oxyCODONE, sodium chloride flush   Vital Signs    Vitals:   07/29/19 2021 07/30/19 0027 07/30/19 0605 07/30/19 0800  BP: 115/69 133/83 137/76 116/71  Pulse: 95 98 93 87  Resp: 20 16 15 17   Temp: 98.1 F (36.7 C) 98 F (36.7 C) 98.1 F (36.7 C) 98 F (36.7 C)  TempSrc: Oral Oral Oral Oral  SpO2: 95% 91% 100% 94%  Weight:   73.4 kg   Height:        Intake/Output Summary (Last 24 hours) at 07/30/2019 0812 Last data filed at 07/30/2019 0645 Gross per 24 hour  Intake 480 ml  Output 1030 ml  Net -550 ml   Last 3 Weights 07/30/2019 07/29/2019 07/28/2019  Weight (lbs) 161 lb 12.8 oz 160 lb 9.6 oz 165 lb 1.6 oz  Weight (kg) 73.392 kg 72.848 kg 74.889 kg      Telemetry    Normal sinus rhythm, rates 80's to 90's with PVCs (sometimes in trigeminy pattern) - Personally Reviewed  ECG    No new ECG tracing today. - Personally Reviewed  Physical Exam   GEN: No acute distress. Hard of hearing. Neck: No JVD Cardiac: RRR. No murmurs, rubs, or gallops.  Respiratory: No increased work of  breathing. Mild crackles in bilateral bases.  GI: Soft, non-distended, and non-tender. MS: 1-2+ edema of bilateral lower extremities (worse around ankles). No deformity. Neuro:  No focal deficits. Psych: Normal affect. Responds appropriately.  Labs    High Sensitivity Troponin:   Recent Labs  Lab 07/26/19 1711 07/26/19 2016  TROPONINIHS 115* 115*      Chemistry Recent Labs  Lab 07/28/19 1453 07/29/19 0330 07/30/19 0303  NA 135 133* 135  K 4.5 3.5 4.1  CL 91* 89* 92*  CO2 33* 31 32  GLUCOSE 150* 194* 241*  BUN 24* 26* 28*  CREATININE 1.45* 1.65* 1.67*  CALCIUM 9.0 8.6* 8.6*  GFRNONAA 50* 43* 42*  GFRAA 58* 50* 49*  ANIONGAP 11 13 11      Hematology Recent Labs  Lab 07/28/19 0316 07/29/19 0330 07/30/19 0303  WBC 8.4 7.5 7.0  RBC 5.44 5.37 4.99  HGB 14.9 14.3 13.5  HCT 46.6 45.3 43.0  MCV 85.7 84.4 86.2  MCH 27.4 26.6 27.1  MCHC 32.0 31.6 31.4  RDW 15.4 15.0 14.9  PLT 215 192 184    BNP Recent Labs  Lab 07/26/19 1236  BNP 2,568.2*     DDimer No results for input(s): DDIMER in the last 168  hours.   Radiology    No results found.  Cardiac Studies   Echocardiogram 07/27/2019: Impressions: 1. There is global left ventricular hypokinesis, with distinct regional  variations. There is akinesis and hyperechogenicity of the entire inferior  septum and inferior wall (consistent with scar in the distribution of the  right coronary artery). There is  severe hypokinesis of the apex and mid-apical anterior septum, consistent  with severe ischemia or infarction in the distribution of the mid-LAD  artery. No intracavitary thrombus is seen. Left ventricular ejection  fraction, by estimation, is 20 to 25%.  The left ventricle has severely decreased function. The left ventricular  internal cavity size was mildly dilated. Left ventricular diastolic  parameters are consistent with Grade I diastolic dysfunction (impaired  relaxation). There is incessant ectopy    that limits diastolic function evaluation.  2. Right ventricular systolic function is moderately reduced. The right  ventricular size is normal. There is normal pulmonary artery systolic  pressure.  3. Left atrial size was mildly dilated.  4. The pericardial effusion is circumferential. There is no evidence of  cardiac tamponade. Large pleural effusion in the left lateral region.  5. The mitral valve is normal in structure. Mild to moderate mitral valve  regurgitation.  6. The aortic valve is tricuspid. Aortic valve regurgitation is not  visualized. Mild aortic valve sclerosis is present, with no evidence of  aortic valve stenosis.  7. The inferior vena cava is dilated in size with >50% respiratory  variability, suggesting right atrial pressure of 8 mmHg.  Patient Profile     64 y.o. male with a history of recently diagnosed CHF at outside hospital, carotid artery stenosis s/p bilateral CEA, hypertension, hyperlipidemia, type 2 diabetes mellitus, COPD, tobacco abuse, CKD stage 3, and depression who is being seen for acute on chronic combined CHF at the request of Dr. Rush Landmark.  Assessment & Plan    Acute on Chronic Combined CHF - BNP elevated at 2,568.  - Chest x-ray consistent with CHF with cardiomegaly, pulmonary edema, and small effusion.  - Echo showed LVEF of 20-25% with global hypokinesis with akinesis and hyperechogenicity of the entire inferior septum and inferior wall (consistent with scar in the distribution of the RCA) as well as severe hypokinesis of the apex and mid-apical anterior septum (consitent with severe ischemia or infraction int he distribution of the mid LAD). - Initially started on IV Lasix 80mg  twice daily but had rise in creatinine; therefore, decreased to 40mg  yesterday. Documented urinary output of 1.030 L in the past 24 hours and net negative 5 L since admission. Weight down 14 lbs since admission. Renal function stable. - Still has some bibasilar  crackles and lower extremity edema on exam so likely does need more diuresis; however, will hold off for now until after cath. - Can likely add beta-blocker after cath. - No ACEi/ARB/ARNI for now given renal function.  -  Plan is for right/left heart catheterization today. The patient understands that risks include but are not limited to stroke (1 in 1000), death (1 in 1000), kidney failure [usually temporary] (1 in 500), bleeding (1 in 200), allergic reaction [possibly serious] (1 in 200), and agrees to proceed. Patient and wife are worried about contrast-induced nephropathy because their daughter developed ESRD after receiving contrast dye. Discussed this with them today and they are willing to proceed. Recommend limiting contrast if possible.   Hypertension - BP well controlled without any antihypertensives.  - Home Lisinopril held due to AKI. -  Continue to monitor.  Hyperlipidemia - Lipid panel this admission: Total cholesterol 203, Triglycerides 106, HDL 34, LDL 148.  - Not on statin at home. Simvastatin listed as allergy. - Started on Crestor 20mg  daily this admission. If intolerant, can consider referral to lipid clinic.  Type 2 Diabetes Mellitus - Hemoglobin A1c 8.6 in 05/2019.  - On Farxiga and Glipizide-Metformin at home.  - Hopefully can resume 02-12-2002 on discharge if creatinine stable given CHF and high suspicion for CAD.  AKI on CKD Stage III - Creatinine 1.65 today. Baseline reportedly around 1.1.  - Continue to avoid Nephrotoxic agents.   Tobacco/Marijuana Abuse - Patient smoking 2-2.5 packs per day prior to admission. Also reported marijuana use. - Discussed importance of complete cessation.   For questions or updates, please contact [facility] Please consult [email or web address] for contact info under        Signed, [place], PA-C  07/30/2019, 8:12 AM

## 2019-07-30 NOTE — Interval H&P Note (Signed)
History and Physical Interval Note:  07/30/2019 5:50 PM  Todd [name]  has presented today for surgery, with the diagnosis of heart failure.  The various methods of treatment have been discussed with the patient and family. After consideration of risks, benefits and other options for treatment, the patient has consented to  Procedure(s): RIGHT/LEFT HEART CATH AND CORONARY ANGIOGRAPHY (N/A) as a surgical intervention.  The patient's history has been reviewed, patient examined, no change in status, stable for surgery.  I have reviewed the patient's chart and labs.  Questions were answered to the patient's satisfaction.    Cath Lab Visit (complete for each Cath Lab visit)  Clinical Evaluation Leading to the Procedure:   ACS: No.  Non-ACS:    Anginal Classification: CCS II  Anti-ischemic medical therapy: No Therapy  Non-Invasive Test Results: No non-invasive testing performed  Prior CABG: No previous CABG        Verne Carrow

## 2019-07-31 ENCOUNTER — Inpatient Hospital Stay (HOSPITAL_COMMUNITY): Payer: Medicare Other

## 2019-07-31 ENCOUNTER — Other Ambulatory Visit: Payer: Self-pay

## 2019-07-31 ENCOUNTER — Encounter (HOSPITAL_COMMUNITY): Payer: Self-pay | Admitting: Cardiovascular Disease

## 2019-07-31 DIAGNOSIS — N179 Acute kidney failure, unspecified: Secondary | ICD-10-CM

## 2019-07-31 DIAGNOSIS — I25110 Atherosclerotic heart disease of native coronary artery with unstable angina pectoris: Secondary | ICD-10-CM

## 2019-07-31 DIAGNOSIS — I5021 Acute systolic (congestive) heart failure: Secondary | ICD-10-CM

## 2019-07-31 LAB — CBC
HCT: 44.6 % (ref 39.0–52.0)
Hemoglobin: 13.9 g/dL (ref 13.0–17.0)
MCH: 26.9 pg (ref 26.0–34.0)
MCHC: 31.2 g/dL (ref 30.0–36.0)
MCV: 86.3 fL (ref 80.0–100.0)
Platelets: 186 10*3/uL (ref 150–400)
RBC: 5.17 MIL/uL (ref 4.22–5.81)
RDW: 15 % (ref 11.5–15.5)
WBC: 7.3 10*3/uL (ref 4.0–10.5)
nRBC: 0 % (ref 0.0–0.2)

## 2019-07-31 LAB — PULMONARY FUNCTION TEST
FEF 25-75 Pre: 1.07 L/sec
FEF2575-%Pred-Pre: 43 %
FEV1-%Pred-Pre: 48 %
FEV1-Pre: 1.5 L
FEV1FVC-%Pred-Pre: 100 %
FEV6-%Pred-Pre: 51 %
FEV6-Pre: 1.99 L
FEV6FVC-%Pred-Pre: 105 %
FVC-%Pred-Pre: 48 %
FVC-Pre: 2 L
Pre FEV1/FVC ratio: 75 %
Pre FEV6/FVC Ratio: 100 %

## 2019-07-31 LAB — BASIC METABOLIC PANEL
Anion gap: 9 (ref 5–15)
BUN: 20 mg/dL (ref 8–23)
CO2: 30 mmol/L (ref 22–32)
Calcium: 8.6 mg/dL — ABNORMAL LOW (ref 8.9–10.3)
Chloride: 95 mmol/L — ABNORMAL LOW (ref 98–111)
Creatinine, Ser: 1.15 mg/dL (ref 0.61–1.24)
GFR calc Af Amer: >60 mL/min (ref >60)
GFR calc non Af Amer: >60 mL/min (ref >60)
Glucose, Bld: 178 mg/dL — ABNORMAL HIGH (ref 70–99)
Potassium: 4.4 mmol/L (ref 3.5–5.1)
Sodium: 134 mmol/L — ABNORMAL LOW (ref 135–145)

## 2019-07-31 LAB — MAGNESIUM: Magnesium: 2 mg/dL (ref 1.7–2.4)

## 2019-07-31 LAB — GLUCOSE, CAPILLARY
Glucose-Capillary: 182 mg/dL — ABNORMAL HIGH (ref 70–99)
Glucose-Capillary: 188 mg/dL — ABNORMAL HIGH (ref 70–99)
Glucose-Capillary: 203 mg/dL — ABNORMAL HIGH (ref 70–99)
Glucose-Capillary: 245 mg/dL — ABNORMAL HIGH (ref 70–99)

## 2019-07-31 LAB — TSH: TSH: 2.243 u[IU]/mL (ref 0.350–4.500)

## 2019-07-31 MED ORDER — MAGNESIUM SULFATE 50 % IJ SOLN
40.0000 meq | INTRAMUSCULAR | Status: DC
  Filled 2019-07-31: qty 9.85

## 2019-07-31 MED ORDER — TRANEXAMIC ACID (OHS) PUMP PRIME SOLUTION
2.0000 mg/kg | INTRAVENOUS | Status: DC
  Filled 2019-07-31: qty 1.47

## 2019-07-31 MED ORDER — POTASSIUM CHLORIDE 2 MEQ/ML IV SOLN
80.0000 meq | INTRAVENOUS | Status: DC
  Filled 2019-07-31: qty 40

## 2019-07-31 MED ORDER — NOREPINEPHRINE 4 MG/250ML-% IV SOLN
0.0000 ug/min | INTRAVENOUS | Status: DC
  Filled 2019-07-31: qty 250

## 2019-07-31 MED ORDER — VANCOMYCIN HCL 1250 MG/250ML IV SOLN
1250.0000 mg | INTRAVENOUS | Status: DC
  Filled 2019-07-31: qty 250

## 2019-07-31 MED ORDER — INSULIN REGULAR(HUMAN) IN NACL 100-0.9 UT/100ML-% IV SOLN
INTRAVENOUS | Status: DC
  Filled 2019-07-31: qty 100

## 2019-07-31 MED ORDER — FUROSEMIDE 10 MG/ML IJ SOLN
40.0000 mg | Freq: Once | INTRAMUSCULAR | Status: AC
  Administered 2019-07-31: 40 mg via INTRAVENOUS
  Filled 2019-07-31: qty 4

## 2019-07-31 MED ORDER — METOPROLOL SUCCINATE ER 25 MG PO TB24
25.0000 mg | ORAL_TABLET | Freq: Every day | ORAL | Status: DC
  Administered 2019-07-31 – 2019-08-01 (×2): 25 mg via ORAL
  Filled 2019-07-31 (×2): qty 1

## 2019-07-31 MED ORDER — LIVING WELL WITH DIABETES BOOK
Freq: Once | Status: AC
  Filled 2019-07-31: qty 1

## 2019-07-31 MED ORDER — TRANEXAMIC ACID 1000 MG/10ML IV SOLN
1.5000 mg/kg/h | INTRAVENOUS | Status: DC
  Filled 2019-07-31: qty 25

## 2019-07-31 MED ORDER — MILRINONE LACTATE IN DEXTROSE 20-5 MG/100ML-% IV SOLN
0.3000 ug/kg/min | INTRAVENOUS | Status: DC
  Filled 2019-07-31: qty 100

## 2019-07-31 MED ORDER — PLASMA-LYTE 148 IV SOLN
INTRAVENOUS | Status: DC
  Filled 2019-07-31: qty 2.5

## 2019-07-31 MED ORDER — HEPARIN SODIUM (PORCINE) 5000 UNIT/ML IJ SOLN
5000.0000 [IU] | Freq: Three times a day (TID) | INTRAMUSCULAR | Status: DC
  Administered 2019-07-31 – 2019-08-02 (×6): 5000 [IU] via SUBCUTANEOUS
  Filled 2019-07-31 (×6): qty 1

## 2019-07-31 MED ORDER — SODIUM CHLORIDE 0.9 % IV SOLN
1.5000 g | INTRAVENOUS | Status: DC
  Filled 2019-07-31: qty 1.5

## 2019-07-31 MED ORDER — TRANEXAMIC ACID (OHS) BOLUS VIA INFUSION
15.0000 mg/kg | INTRAVENOUS | Status: DC
  Filled 2019-07-31: qty 1103

## 2019-07-31 MED ORDER — EPINEPHRINE HCL 5 MG/250ML IV SOLN IN NS
0.0000 ug/min | INTRAVENOUS | Status: DC
  Filled 2019-07-31: qty 250

## 2019-07-31 MED ORDER — SODIUM CHLORIDE 0.9 % IV SOLN
750.0000 mg | INTRAVENOUS | Status: DC
  Filled 2019-07-31: qty 750

## 2019-07-31 MED ORDER — NITROGLYCERIN IN D5W 200-5 MCG/ML-% IV SOLN
2.0000 ug/min | INTRAVENOUS | Status: DC
  Filled 2019-07-31: qty 250

## 2019-07-31 MED ORDER — DEXMEDETOMIDINE HCL IN NACL 400 MCG/100ML IV SOLN
0.1000 ug/kg/h | INTRAVENOUS | Status: DC
  Filled 2019-07-31: qty 100

## 2019-07-31 MED ORDER — PHENYLEPHRINE HCL-NACL 20-0.9 MG/250ML-% IV SOLN
30.0000 ug/min | INTRAVENOUS | Status: DC
  Filled 2019-07-31: qty 250

## 2019-07-31 MED ORDER — SODIUM CHLORIDE 0.9 % IV SOLN
INTRAVENOUS | Status: DC
  Filled 2019-07-31: qty 30

## 2019-07-31 NOTE — Progress Notes (Signed)
Physical Therapy Treatment Patient Details Name: Todd [name] MRN: [id number] DOB: Sep 24, 1955 Today's Date: 07/31/2019    History of Present Illness 64 yo male with onset of SOB and chestpain was admitted, noted AKI, CHF, had recent PNA, elevated troponin, pulm edema and effusion, and cardiomegaly.  PMHx:  CHF, B CEA's, carotid stenosis, smoker, CAD, DM, depression, COPD,     PT Comments    Patient received sitting up on side of bed. Wife present in room. Patient agrees to work with PT. Reports he is feeling okay. Wife tells me he is planned for open heart surgery tomorrow. Patient is very pleasant and motivated to mobilize. He transfers with supervision. Ambulated 150 feet with QC and min guard. No LOB, slow, steady pace. He will continue to benefit from skilled PT while here to improve mobility, activity tolerance and strength for safe return home at discharge.     Follow Up Recommendations  Home health PT;Supervision for mobility/OOB     Equipment Recommendations  None recommended by PT    Recommendations for Other Services       Precautions / Restrictions Precautions Precautions: Fall Restrictions Weight Bearing Restrictions: No    Mobility  Bed Mobility               General bed mobility comments: not assessed, patient sitting edge of bed  Transfers Overall transfer level: Needs assistance Equipment used: Quad cane Transfers: Sit to/from Stand Sit to Stand: Supervision            Ambulation/Gait Ambulation/Gait assistance: Min guard Gait Distance (Feet): 150 Feet Assistive device: Quad cane Gait Pattern/deviations: Step-through pattern Gait velocity: reduced   General Gait Details: generally steady with ambulation, requires supervision for safety and assist for equipment management/monitoring HR/O2   Stairs             Wheelchair Mobility    Modified Rankin (Stroke Patients Only)       Balance Overall balance assessment: Needs  assistance Sitting-balance support: Feet supported Sitting balance-Leahy Scale: Good     Standing balance support: Single extremity supported;During functional activity Standing balance-Leahy Scale: Good Standing balance comment: no lob this day. Slow, steady pace                            Cognition Arousal/Alertness: Awake/alert Behavior During Therapy: WFL for tasks assessed/performed Overall Cognitive Status: Within Functional Limits for tasks assessed                                        Exercises      General Comments        Pertinent Vitals/Pain Pain Assessment: No/denies pain    Home Living                      Prior Function            PT Goals (current goals can now be found in the care plan section) Acute Rehab PT Goals Patient Stated Goal: to get home soon PT Goal Formulation: With patient/family Time For Goal Achievement: 08/07/19 Potential to Achieve Goals: Good Progress towards PT goals: Progressing toward goals    Frequency    Min 3X/week      PT Plan Current plan remains appropriate    Co-evaluation  AM-PAC PT "6 Clicks" Mobility   Outcome Measure  Help needed turning from your back to your side while in a flat bed without using bedrails?: None Help needed moving from lying on your back to sitting on the side of a flat bed without using bedrails?: None Help needed moving to and from a bed to a chair (including a wheelchair)?: A Little Help needed standing up from a chair using your arms (e.g., wheelchair or bedside chair)?: A Little Help needed to walk in hospital room?: A Little Help needed climbing 3-5 steps with a railing? : A Little 6 Click Score: 20    End of Session Equipment Utilized During Treatment: Gait belt Activity Tolerance: Patient tolerated treatment well Patient left: with call bell/phone within reach;with family/visitor present;Other (comment);with  nursing/sitter in room (seated on edge of bed) Nurse Communication: Mobility status PT Visit Diagnosis: Unsteadiness on feet (R26.81);Muscle weakness (generalized) (M62.81);Difficulty in walking, not elsewhere classified (R26.2)     Time: 5916-3846 PT Time Calculation (min) (ACUTE ONLY): 28 min  Charges:  $Gait Training: 23-37 mins                     [employer], PT, GCS 07/31/19,12:25 PM

## 2019-07-31 NOTE — TOC Benefit Eligibility Note (Signed)
Transition of Care [facility]) Benefit Eligibility Note    Patient Details  Name: Kaylen Nghiem MRN: [id number] Date of Birth: Jul 23, 1955   Medication/Dose: Trulicity and Ozempic  Covered?: Yes     Prescription Coverage Preferred Pharmacy: retail: [facility], [address] [place] //mail order: [facility]  Spoke with Person/Company/Phone Number:: [facility]  Co-Pay: Truliciity: $226 for 90 day mail order/ $142 for 30 day retail /// Ozempic $142 for 30 day retail/ $226 for 90 day mail order  Prior Approval: No          Orson Aloe Phone Number: 07/31/2019, 3:59 PM

## 2019-07-31 NOTE — Progress Notes (Addendum)
Inpatient Diabetes Program Recommendations  AACE/ADA: New Consensus Statement on Inpatient Glycemic Control (2015)  Target Ranges:  Prepandial:   less than 140 mg/dL      Peak postprandial:   less than 180 mg/dL (1-2 hours)      Critically ill patients:  140 - 180 mg/dL   Lab Results  Component Value Date   GLUCAP 188 (H) 07/31/2019   HGBA1C 9.0 (H) 07/26/2019    Review of Glycemic Control Results for Todd [name], Todd [name] (MRN [id number]) as of 07/31/2019 12:55  Ref. Range 07/30/2019 11:45 07/30/2019 16:10 07/30/2019 21:18 07/31/2019 06:40 07/31/2019 11:18  Glucose-Capillary Latest Ref Range: 70 - 99 mg/dL 177 (H) 939 (H) 030 (H) 182 (H) 188 (H)    Diabetes history:  DM2  Outpatient Diabetes medications:  Farxiga 10 mg daily  Metaglip 2.5-500 mg bid Actos 45 mg daily  Current orders for Inpatient glycemic control:  Lantus 5 units qhs Novolog 0-15 units tid  Note:  Spoke with patient and wife at bedside.  Reviewed patient's current A1c of 9% (average blood sugar of 212 mg/dl). Explained what a A1c is and what it measures. Also reviewed goal A1c with patient, importance of good glucose control @ home, and blood sugar goals.  Reviewed DM2 patho, long and short term complications.  Patient is on the schedule for CABG tomorrow.  He admits to drinking a lot of sweet tea and does not watch his CHO intake.  Reviewed The Plate Method, goal CHO per meal and foods that contain CHO's.   Also explained that 15-20 minute walks daily can help bring down blood sugar (once cleared by MD).  Encouraged cardiac rehab.  He lives in [place].    He admits to checking his blood sugar 3 times daily and takes above home medications as prescribed.  No difficulties obtaining medications.  He is current with his PCP and sees him every month.    Will attach education to AVS and order Living Well with Diabetes booklet.  Also ordered RD consult.  Plan for IV insulin tomorrow with surgery.  Will continue to follow while  inpatient.  Thank you, Dulce Sellar, RN, BSN Diabetes Coordinator Inpatient Diabetes Program [phone] (team pager from 8a-5p)

## 2019-07-31 NOTE — Consult Note (Addendum)
    [address]       [place], [address]             [phone]        [facility] Pres[facility] Medical Record #[id number] Date of Birth: 03/13/1955  Referring:  McAlhany, Christopher D, MD Primary Care: Hague, Imran P, MD Primary Cardiologist:No primary care provider on file.  Chief Complaint:    Chief Complaint  Patient presents with  . Shortness of Breath   History of Present Illness:   Mr. Housey is a 64-year-old gentleman with past medical history significant for type 2 diabetes mellitus, dyslipidemia, spinal stenosis resulting in chronic pain syndrome, chronic renal insufficiency, and history of bilateral carotid artery stenoses requiring bilateral carotid endarterectomy 7 to 8 years ago.  He is also an active smoker and uses marijuana on a daily basis.  While he denies ever having any chest pain, he reports having progressive shortness of breath starting about 3 months ago.  This worsened to the point that he presented for evaluation to his primary care physician, Dr. Jain.  An echocardiogram was performed which is reported to show reduced ejection fraction of 25 to 30% which was drastically reduced from an EF of around 60% on echo done about a year earlier.  Arrangements will be made for referral to Dr. Bensimhon for management of this newly diagnosed heart failure clinic because of worsening symptoms, Mr. Faulk presented to the [facility] emergency room on 617.  In the ED, he was noted to have significant peripheral edema and on chest x-ray had obvious pulmonary edema with cardiomegaly.  His BNP was elevated in excess of 2500.  Serial high-sensitivity troponin were 115x2.  His EKG showed sinus rhythm with anterior and anteroseptal Q waves.  He was admitted to the hospital for management of acute on chronic systolic heart failure.  He was diuresed aggressively which resulted in significant improvement in his breathing.  He did develop acute on chronic renal insufficiency  in response to diuresis and was given some time to recover prior to having left heart catheterization last evening.  This study demonstrated severe three-vessel coronary artery disease with chronic total occlusions of both the LAD and right coronary arteries.  Additionally, there were high-grade lesions involving the first and second obtuse marginals.  We have been asked to evaluate Mr. Hemberger for consideration of surgical coronary revascularization. Mr. Oddo is currently resting in bed and appears comfortable.  His wife is at bedside.  He denies any pain or shortness of breath currently.  He is on room air and oxygen saturation is 100%.   Current Activity/ Functional Status:    Zubrod Score: At the time of surgery this patient's most appropriate activity status/level should be described as: []    0    Normal activity, no symptoms [x]    1    Restricted in physical strenuous activity but ambulatory, able to do out light work []    2    Ambulatory and capable of self care, unable to do work activities, up and about                 more than 50%  Of the time                            []    3    Only limited self care, in bed greater than 50% of waking   hours []    4    Completely disabled, no self care, confined to bed or chair []    5    Moribund  Past Medical History:  Diagnosis Date  . Arthritis   . CHF (congestive heart failure) (HCC)   . Depression   . Diabetes mellitus without complication (HCC)   . Hyperlipidemia   . Hypertension     Past Surgical History:  Procedure Laterality Date  . APPENDECTOMY  1995  . CAROTID ENDARTERECTOMY  2010  . RIGHT/LEFT HEART CATH AND CORONARY ANGIOGRAPHY N/A 07/30/2019   Procedure: RIGHT/LEFT HEART CATH AND CORONARY ANGIOGRAPHY;  Surgeon: McAlhany, Christopher D, MD;  Location: [facility];  Service: Cardiovascular;  Laterality: N/A;    Social History   Tobacco Use  Smoking Status Current Every Day Smoker  . Packs/day: 2.50  . Types:  Cigarettes    Social History   Substance and Sexual Activity  Alcohol Use None     Allergies  Allergen Reactions  . Empagliflozin     Other reaction(s): diarrhea  . Other     Other reaction(s): Unknown  . Simvastatin     Other reaction(s): diarrhea  . Sitagliptin     Other reaction(s): diarrhea/abd pain    Current Facility-Administered Medications  Medication Dose Route Frequency Provider Last Rate Last Admin  . 0.9 %  sodium chloride infusion  250 mL Intravenous PRN McAlhany, Christopher D, MD      . 0.9 %  sodium chloride infusion  250 mL Intravenous PRN McAlhany, Christopher D, MD      . acetaminophen (TYLENOL) tablet 650 mg  650 mg Oral Q4H PRN McAlhany, Christopher D, MD      . albuterol (PROVENTIL) (2.5 MG/3ML) 0.083% nebulizer solution 3 mL  3 mL Inhalation Q6H PRN McAlhany, Christopher D, MD      . aspirin EC tablet 81 mg  81 mg Oral Daily McAlhany, Christopher D, MD   81 mg at 07/31/19 0854  . [START ON 08/01/2019] cefUROXime (ZINACEF) 1.5 g in sodium chloride 0.9 % 100 mL IVPB  1.5 g Intravenous To OR Toma Erichsen C, MD      . [START ON 08/01/2019] cefUROXime (ZINACEF) 750 mg in sodium chloride 0.9 % 100 mL IVPB  750 mg Intravenous To OR Yahshua Thibault C, MD      . citalopram (CELEXA) tablet 20 mg  20 mg Oral Daily McAlhany, Christopher D, MD   20 mg at 07/31/19 0854  . [START ON 08/01/2019] dexmedetomidine (PRECEDEX) 400 MCG/100ML (4 mcg/mL) infusion  0.1-0.7 mcg/kg/hr Intravenous To OR Albertia Carvin C, MD      . [START ON 08/01/2019] EPINEPHrine (ADRENALIN) 4 mg in NS 250 mL (0.016 mg/mL) premix infusion  0-10 mcg/min Intravenous To OR Roxanne Panek C, MD      . [START ON 08/01/2019] heparin 30,000 units/NS 1000 mL solution for CELLSAVER   Other To OR Jaymeson Mengel C, MD      . [START ON 08/01/2019] heparin sodium (porcine) 2,500 Units, papaverine 30 mg in electrolyte-148 (PLASMALYTE-148) 500 mL irrigation   Irrigation To OR Iretta Mangrum C,  MD      . insulin aspart (novoLOG) injection 0-15 Units  0-15 Units Subcutaneous TID WC McAlhany, Christopher D, MD   3 Units at 07/31/19 1121  . insulin glargine (LANTUS) injection 5 Units  5 Units Subcutaneous QHS McAlhany, Christopher D, MD   5 Units at 07/30/19 2149  . [START ON 08/01/2019] insulin regular, human (MYXREDLIN)   100 units/ 100 mL infusion   Intravenous To OR Mirca Yale C, MD      . [START ON 08/01/2019] magnesium sulfate (IV Push/IM) injection 40 mEq  40 mEq Other To OR Ixel Boehning C, MD      . metoprolol succinate (TOPROL-XL) 24 hr tablet 25 mg  25 mg Oral Daily Goodrich, Callie E, PA-C   25 mg at 07/31/19 1114  . [START ON 08/01/2019] milrinone (PRIMACOR) 20 MG/100 ML (0.2 mg/mL) infusion  0.3 mcg/kg/min Intravenous To OR Renesha Lizama C, MD      . nicotine (NICODERM CQ - dosed in mg/24 hours) patch 21 mg  21 mg Transdermal Daily McAlhany, Christopher D, MD      . [START ON 08/01/2019] nitroGLYCERIN 50 mg in dextrose 5 % 250 mL (0.2 mg/mL) infusion  2-200 mcg/min Intravenous To OR Tong Pieczynski C, MD      . [START ON 08/01/2019] norepinephrine (LEVOPHED) 4mg in 250mL premix infusion  0-40 mcg/min Intravenous To OR Gidget Quizhpi C, MD      . oxyCODONE (Oxy IR/ROXICODONE) immediate release tablet 15 mg  15 mg Oral Q8H PRN McAlhany, Christopher D, MD   15 mg at 07/31/19 0853  . [START ON 08/01/2019] phenylephrine (NEOSYNEPHRINE) 20-0.9 MG/250ML-% infusion  30-200 mcg/min Intravenous To OR Kimra Kantor C, MD      . [START ON 08/01/2019] potassium chloride injection 80 mEq  80 mEq Other To OR Alexandrina Fiorini C, MD      . rosuvastatin (CRESTOR) tablet 20 mg  20 mg Oral Daily McAlhany, Christopher D, MD   20 mg at 07/31/19 0853  . sodium chloride flush (NS) 0.9 % injection 3 mL  3 mL Intravenous Q12H McAlhany, Christopher D, MD   3 mL at 07/31/19 0856  . sodium chloride flush (NS) 0.9 % injection 3 mL  3 mL Intravenous Q12H McAlhany, Christopher D, MD    3 mL at 07/31/19 0856  . sodium chloride flush (NS) 0.9 % injection 3 mL  3 mL Intravenous PRN McAlhany, Christopher D, MD      . [START ON 08/01/2019] tranexamic acid (CYKLOKAPRON) 2,500 mg in sodium chloride 0.9 % 250 mL (10 mg/mL) infusion  1.5 mg/kg/hr Intravenous To OR Arlando Leisinger C, MD      . [START ON 08/01/2019] tranexamic acid (CYKLOKAPRON) bolus via infusion - over 30 minutes 1,102.5 mg  15 mg/kg Intravenous To OR Estill Llerena C, MD      . [START ON 08/01/2019] tranexamic acid (CYKLOKAPRON) pump prime solution 147 mg  2 mg/kg Intracatheter To OR Maritssa Haughton C, MD      . umeclidinium bromide (INCRUSE ELLIPTA) 62.5 MCG/INH 1 puff  1 puff Inhalation Daily McAlhany, Christopher D, MD   1 puff at 07/31/19 0912  . [START ON 08/01/2019] vancomycin (VANCOREADY) IVPB 1250 mg/250 mL  1,250 mg Intravenous To OR Alilah Mcmeans C, MD        Medications Prior to Admission  Medication Sig Dispense Refill Last Dose  . citalopram (CELEXA) 40 MG tablet Take 40 mg by mouth daily.   07/25/2019 at Unknown time  . dapagliflozin propanediol (FARXIGA) 10 MG TABS tablet Take 10 mg by mouth daily.   07/25/2019 at Unknown time  . ergocalciferol (VITAMIN D2) 1.25 MG (50000 UT) capsule Take 50,000 Units by mouth 2 (two) times a week.   Past Week at Unknown time  . furosemide (LASIX) 20 MG tablet Take 40 mg by mouth daily.   07/25/2019 at Unknown time  .   glipiZIDE-metformin (METAGLIP) 2.5-500 MG tablet Take 2 tablets by mouth 2 (two) times daily with a meal.   07/25/2019 at Unknown time  . ibuprofen (ADVIL) 200 MG tablet Take 400 mg by mouth 2 (two) times daily as needed for moderate pain.   Past Week at Unknown time  . levofloxacin (LEVAQUIN) 750 MG tablet Take 750 mg by mouth daily. For 14 days.   07/25/2019 at Unknown time  . lisinopril (ZESTRIL) 20 MG tablet Take 20 mg by mouth daily.   Past Week at Unknown time  . oxyCODONE (ROXICODONE) 15 MG immediate release tablet Take 15 mg by mouth 3  (three) times daily.   07/25/2019 at Unknown time  . pioglitazone (ACTOS) 45 MG tablet Take 45 mg by mouth daily.   07/25/2019 at Unknown time  . promethazine (PHENERGAN) 25 MG tablet Take 25 mg by mouth 2 (two) times daily as needed for nausea or vomiting.   Past Week at Unknown time    Family History  Problem Relation Age of Onset  . Heart disease Mother   . Hypertension Mother      Review of Systems:   ROS     Cardiac Review of Systems: Y or  [    ]= no  Chest Pain [    ]  Resting SOB [x   ] Exertional SOB  [x  ]  Orthopnea [  ]   Pedal Edema [ x  ]    Palpitations [  ] Syncope  [  ]   Presyncope [   ]  General Review of Systems: [Y] = yes [  ]=no Constitional: recent weight change [  ]; anorexia [  ]; fatigue [  ]; nausea [  ]; night sweats [  ]; fever [  ]; or chills [  ]                                                                Eye : blurred vision [  ]; diplopia [   ]; vision changes [  ];  Amaurosis fugax[  ]; Resp: cough [  ];  wheezing[  ];  hemoptysis[  ]; shortness of breath[x  ]; paroxysmal nocturnal dyspnea[  ]; dyspnea on exertion[  ]; or orthopnea[  ];  GI:  gallstones[  ], vomiting[  ];  dysphagia[  ]; melena[  ];  hematochezia [  ]; heartburn[  ];   Hx of  Colonoscopy[  ]; GU: kidney stones [  ]; hematuria[  ];   dysuria [  ];  nocturia[  ];  history of     obstruction [  ]; urinary frequency [  ]             Skin: rash, swelling[  ];, hair loss[  ];  peripheral edema[  ];  or itching[  ]; Musculosketetal: myalgias[  ];  joint swelling[  ];  joint erythema[  ];  joint pain[  ];  back pain[x  ];  Heme/Lymph: bruising[  ];  bleeding[  ];  anemia[  ];  Neuro: TIA[  ];  headaches[  ];  stroke[  ];  vertigo[  ];  seizures[  ];   paresthesias[  ];  difficulty walking[  ];  Psych:depression[x  ]; anxiety[  ];    Endocrine: diabetes[ x ];  thyroid dysfunction[  ];            Physical Exam: BP 121/76 (BP Location: Right Arm)   Pulse 91   Temp 98 F (36.7 C) (Oral)    Resp 16   Ht 5' 3" (1.6 m)   Wt 73.5 kg   SpO2 100%   BMI 28.70 kg/m    General appearance: alert, cooperative and no distress Head: Normocephalic, without obvious abnormality, atraumatic Neck: no adenopathy, no carotid bruit, no JVD, supple, symmetrical, trachea midline and thyroid not enlarged, symmetric, no tenderness/mass/nodules Lymph nodes: Cervical, supraclavicular, and axillary nodes normal. and No obvious lymphadenopathy Resp: Few expiratory crackles.  No wheezes. Cardio: regular rate and rhythm, S1, S2 normal, no murmur, click, rub or gallop GI: Soft, nontender, active bowel sounds. Extremities: All extremities are well perfused.  There is a bulky dressing applied over the right radial artery where he was cathed last evening.  Distal perfusion is excellent. Neurologic: Grossly normal  Diagnostic Studies & Laboratory data:     Recent Radiology Findings:   ECHOCARDIOGRAM REPORT       Patient Name:  Connelly Janek Date of Exam: 07/27/2019  Medical Rec #: [id number] Height:    63.0 in  Accession #:  [id number] Weight:    169.8 lb  Date of Birth: 03/04/1955 BSA:     1.803 m  Patient Age:  63 years  BP:      114/75 mmHg  Patient Gender: M     HR:      99 bpm.  Exam Location: Inpatient   Procedure: 2D Echo, 3D Echo, Color Doppler and Cardiac Doppler   Indications:  I50.20* Unspecified systolic (congestive) heart failure    History:    Patient has no prior history of Echocardiogram  examinations. CHF         and Systolic heart failure; Risk Factors:Hypertension and         Dyslipidemia. Patient had echo 4 days ago at another  facility.    Sonographer:  Tina West RDCS  Referring Phys: [id number] KRISTA M. KROEGER   IMPRESSIONS    1. There is global left ventricular hypokinesis, with distinct regional  variations. There is akinesis and hyperechogenicity of the entire inferior  septum and inferior wall  (consistent with scar in the distribution of the  right coronary artery). There is  severe hypokinesis of the apex and mid-apical anterior septum, consistent  with severe ischemia or infarction in the distribution of the mid-LAD  artery. No intracavitary thrombus is seen. Left ventricular ejection  fraction, by estimation, is 20 to 25%.  The left ventricle has severely decreased function. The left ventricular  internal cavity size was mildly dilated. Left ventricular diastolic  parameters are consistent with Grade I diastolic dysfunction (impaired  relaxation). There is incessant ectopy  that limits diastolic function evaluation.  2. Right ventricular systolic function is moderately reduced. The right  ventricular size is normal. There is normal pulmonary artery systolic  pressure.  3. Left atrial size was mildly dilated.  4. The pericardial effusion is circumferential. There is no evidence of  cardiac tamponade. Large pleural effusion in the left lateral region.  5. The mitral valve is normal in structure. Mild to moderate mitral valve  regurgitation.  6. The aortic valve is tricuspid. Aortic valve regurgitation is not  visualized. Mild aortic valve sclerosis is present, with no evidence of  aortic valve stenosis.  7. The inferior vena cava is dilated in   size with >50% respiratory  variability, suggesting right atrial pressure of 8 mmHg.   FINDINGS  Left Ventricle: There is global left ventricular hypokinesis, with  distinct regional variations. There is akinesis and hyperechogenicity of  the entire inferior septum and inferior wall (consistent with scar in the  distribution of the right coronary  artery). There is severe hypokinesis of the apex and mid-apical anterior  septum, consistent with severe ischemia or infarction in the distribution  of the mid-LAD artery. No intracavitary thrombus is seen. Left ventricular  ejection fraction, by  estimation, is 20 to 25%. The  left ventricle has severely decreased  function. The left ventricle demonstrates regional wall motion  abnormalities. The left ventricular internal cavity size was mildly  dilated. There is no left ventricular hypertrophy. Left  ventricular diastolic parameters are consistent with Grade I diastolic  dysfunction (impaired relaxation). Normal left ventricular filling  pressure.   Right Ventricle: The right ventricular size is normal. No increase in  right ventricular wall thickness. Right ventricular systolic function is  moderately reduced. There is normal pulmonary artery systolic pressure.  The tricuspid regurgitant velocity is  1.88 m/s, and with an assumed right atrial pressure of 8 mmHg, the  estimated right ventricular systolic pressure is 22.1 mmHg.   Left Atrium: Left atrial size was mildly dilated.   Right Atrium: Right atrial size was normal in size.   Pericardium: A small pericardial effusion is present. The pericardial  effusion is circumferential. There is no evidence of cardiac tamponade.   Mitral Valve: The mitral valve is normal in structure. Mild mitral annular  calcification. Mild to moderate mitral valve regurgitation, with  centrally-directed jet.   Tricuspid Valve: The tricuspid valve is normal in structure. Tricuspid  valve regurgitation is not demonstrated.   Aortic Valve: The aortic valve is tricuspid. Aortic valve regurgitation is  not visualized. Mild aortic valve sclerosis is present, with no evidence  of aortic valve stenosis.   Pulmonic Valve: The pulmonic valve was grossly normal. Pulmonic valve  regurgitation is not visualized.   Aorta: The aortic root and ascending aorta are structurally normal, with  no evidence of dilitation.   Venous: The inferior vena cava is dilated in size with greater than 50%  respiratory variability, suggesting right atrial pressure of 8 mmHg.   IAS/Shunts: No atrial level shunt detected by color flow Doppler.    Additional Comments: There is a large pleural effusion in the left lateral  region.     LEFT VENTRICLE  PLAX 2D  LVIDd:     5.90 cm   Diastology  LVIDs:     5.30 cm   LV e' lateral:  9.68 cm/s  LV PW:     0.80 cm   LV E/e' lateral: 9.8  LV IVS:    1.40 cm  LVOT diam:   2.00 cm  LV SV:     51  LV SV Index:  28  LVOT Area:   3.14 cm    LV Volumes (MOD)  LV vol d, MOD A2C: 190.5 ml  LV vol d, MOD A4C: 105.0 ml  LV vol s, MOD A2C: 126.0 ml  LV vol s, MOD A4C: 98.6 ml  LV SV MOD A2C:   64.5 ml  LV SV MOD A4C:   105.0 ml  LV SV MOD BP:   30.1 ml   RIGHT VENTRICLE      IVC  RV S prime:   7.28 cm/s IVC diam: 2.10 cm  TAPSE (M-mode):   1.1 cm   LEFT ATRIUM       Index    RIGHT ATRIUM      Index  LA diam:    3.90 cm 2.16 cm/m RA Area:   12.30 cm  LA Vol (A2C):  52.9 ml 29.33 ml/m RA Volume:  30.10 ml 16.69 ml/m  LA Vol (A4C):  35.7 ml 19.80 ml/m  LA Biplane Vol: 42.4 ml 23.51 ml/m  AORTIC VALVE  LVOT Vmax:  109.00 cm/s  LVOT Vmean: 67.500 cm/s  LVOT VTI:  0.161 m    AORTA  Ao Root diam: 3.20 cm  Ao Asc diam: 2.80 cm   MITRAL VALVE        TRICUSPID VALVE  MV Area (PHT): 4.19 cm   TR Peak grad:  14.1 mmHg  MV Decel Time: 181 msec   TR Vmax:    188.00 cm/s  MV E velocity: 95.10 cm/s  MV A velocity: 119.00 cm/s SHUNTS  MV E/A ratio: 0.80     Systemic VTI: 0.16 m               Systemic Diam: 2.00 cm   Mihai Croitoru MD  Electronically signed by Mihai Croitoru MD  Signature Date/Time: 07/27/2019/3:38:30 PM       RIGHT/LEFT HEART CATH AND CORONARY ANGIOGRAPHY  Conclusion    Prox RCA lesion is 100% stenosed.  Prox Cx to Mid Cx lesion is 100% stenosed.  1st Mrg-1 lesion is 80% stenosed.  1st Mrg-2 lesion is 90% stenosed.  Mid LAD lesion is 100% stenosed.  Ost LM to Mid LM lesion is 40% stenosed.  Ost LAD to Prox LAD lesion is 50%  stenosed.  1st Diag lesion is 70% stenosed.   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 High grade disease in the Circumflex and OM  CT surgery consult for CABG  Recommendations  Antiplatelet/Anticoag Diffuse 3 vessel CAD. Will get CT surgery consult for CABG  Indications  Acute systolic CHF (congestive heart failure), NYHA class 3 (HCC) [I50.21 (ICD-10-CM)]  Ischemic cardiomyopathy [I25.5 (ICD-10-CM)]  Procedural Details  Technical Details Indication: Cardiomyopathy, acute systolic CHF  Procedure: The risks, benefits, complications, treatment options, and expected outcomes were discussed with the patient. The patient and/or family concurred with the proposed plan, giving informed consent. The patient was brought to the cath lab after IV hydration was given. The patient was  sedated with Versed and Fentanyl. The IV catheter in the right antecubital vein was changed for a 7 French sheath. Right heart catheterization performed with a balloon tipped catheter. The right wrist was prepped and draped in a sterile fashion. 1% lidocaine was used for local anesthesia. Using the modified Seldinger access technique, a 5 French sheath was placed in the right radial artery. 3 mg Verapamil was given through the sheath. 4000 units IV heparin was given. Standard diagnostic catheters were used to perform selective coronary angiography. LV pressures measured with the JR4 catheter. The sheath was removed from the right radial artery and a Terumo hemostasis band was applied at the arteriotomy site on the right wrist.    Estimated blood loss <50 mL.   During this procedure medications were administered to achieve and maintain moderate conscious sedation while the patient's heart rate, blood pressure, and oxygen saturation were continuously monitored and I was present face-to-face 100% of this time.  Medications (Filter: Administrations occurring from 1748 to 1845 on  07/30/19) (important) Continuous medications are totaled by the amount administered until   07/30/19 1845.  Heparin (Porcine) in NaCl 1000-0.9 UT/500ML-% SOLN (mL) Total volume:  1,000 mL  Date/Time  Rate/Dose/Volume Action  07/30/19 1755  500 mL Given  1755  500 mL Given    fentaNYL (SUBLIMAZE) injection (mcg) Total dose:  50 mcg  Date/Time  Rate/Dose/Volume Action  07/30/19 1806  50 mcg Given    midazolam (VERSED) injection (mg) Total dose:  2 mg  Date/Time  Rate/Dose/Volume Action  07/30/19 1806  2 mg Given    Radial Cocktail/Verapamil only (mL) Total volume:  10 mL  Date/Time  Rate/Dose/Volume Action  07/30/19 1807  10 mL Given    heparin sodium (porcine) injection (Units) Total dose:  4,000 Units  Date/Time  Rate/Dose/Volume Action  07/30/19 1812  4,000 Units Given    lidocaine (PF) (XYLOCAINE) 1 % injection (mL) Total volume:  5 mL  Date/Time  Rate/Dose/Volume Action  07/30/19 1804  5 mL Given    iohexol (OMNIPAQUE) 350 MG/ML injection (mL) Total volume:  30 mL  Date/Time  Rate/Dose/Volume Action  07/30/19 1835  30 mL Given    0.9 % sodium chloride infusion (mL) Total dose:  Cannot be calculated* Dosing weight:  79.4  *Administration dose not documented Date/Time  Rate/Dose/Volume Action  07/30/19 1748  *Not included in total MAR Hold    acetaminophen (TYLENOL) tablet 650 mg (mg) Total dose:  Cannot be calculated* Dosing weight:  79.4  *Administration dose not documented Date/Time  Rate/Dose/Volume Action  07/30/19 1748  *Not included in total MAR Hold    albuterol (PROVENTIL) (2.5 MG/3ML) 0.083% nebulizer solution 3 mL (mL) Total dose:  Cannot be calculated* Dosing weight:  74.9  *Administration dose not documented Date/Time  Rate/Dose/Volume Action  07/30/19 1748  *Not included in total MAR Hold    aspirin EC tablet 81 mg (mg) Total dose:  Cannot be calculated* Dosing weight:  74.9  *Administration dose not documented Date/Time   Rate/Dose/Volume Action  07/30/19 1748  *Not included in total MAR Hold    citalopram (CELEXA) tablet 20 mg (mg) Total dose:  Cannot be calculated* Dosing weight:  74.9  *Administration dose not documented Date/Time  Rate/Dose/Volume Action  07/30/19 1748  *Not included in total MAR Hold    insulin aspart (novoLOG) injection 0-15 Units (Units) Total dose:  Cannot be calculated* Dosing weight:  79.4  *Administration dose not documented Date/Time  Rate/Dose/Volume Action  07/30/19 1748  *Not included in total MAR Hold    insulin glargine (LANTUS) injection 5 Units (Units) Total dose:  Cannot be calculated* Dosing weight:  73.4  *Administration dose not documented Date/Time  Rate/Dose/Volume Action  07/30/19 1748  *Not included in total MAR Hold    nicotine (NICODERM CQ - dosed in mg/24 hours) patch 21 mg (mg) Total dose:  Cannot be calculated* Dosing weight:  72.8  *Administration dose not documented Date/Time  Rate/Dose/Volume Action  07/30/19 1748  *Not included in total MAR Hold    oxyCODONE (Oxy IR/ROXICODONE) immediate release tablet 15 mg (mg) Total dose:  Cannot be calculated* Dosing weight:  79.4  *Administration dose not documented Date/Time  Rate/Dose/Volume Action  07/30/19 1748  *Not included in total MAR Hold    rosuvastatin (CRESTOR) tablet 20 mg (mg) Total dose:  Cannot be calculated* Dosing weight:  77  *Administration dose not documented Date/Time  Rate/Dose/Volume Action  07/30/19 1748  *Not included in total MAR Hold    sodium chloride flush (NS) 0.9 % injection 3 mL (mL) Total dose:  Cannot be calculated* Dosing   weight:  77  *Administration dose not documented Date/Time  Rate/Dose/Volume Action  07/30/19 1748  *Not included in total MAR Hold    umeclidinium bromide (INCRUSE ELLIPTA) 62.5 MCG/INH 1 puff (puff) Total dose:  Cannot be calculated* Dosing weight:  72.8  *Administration dose not documented Date/Time  Rate/Dose/Volume Action   07/30/19 1748  *Not included in total MAR Hold    enoxaparin (LOVENOX) injection 40 mg (mg) Total dose:  Cannot be calculated* Dosing weight:  79.4  *Administration dose not documented Date/Time  Rate/Dose/Volume Action  07/30/19 1748  *Not included in total MAR Hold    Sedation Time  Sedation Time Physician-1: 26 minutes 16 seconds  Contrast  Medication Name Total Dose  iohexol (OMNIPAQUE) 350 MG/ML injection 30 mL    Radiation/Fluoro  Fluoro time: 5.1 (min) DAP: 41338 (mGycm2) Cumulative Air Kerma: 270 (mGy)  Complications  Complications documented before study signed (07/30/2019 6:45 PM)   RIGHT/LEFT HEART CATH AND CORONARY ANGIOGRAPHY  None Documented by McAlhany, Christopher D, MD 07/30/2019 6:41 PM  Date Found: 07/30/2019  Time Range: Intraprocedure      Coronary Findings  Diagnostic Dominance: Right Left Main  Ost LM to Mid LM lesion is 40% stenosed.  Left Anterior Descending  Vessel is large.  Ost LAD to Prox LAD lesion is 50% stenosed.  Mid LAD lesion is 100% stenosed. The lesion is chronically occluded.  First Diagonal Branch  Vessel is moderate in size.  1st Diag lesion is 70% stenosed.  Third Diagonal Branch  Collaterals  3rd Diag filled by collaterals from 1st Diag.    Left Circumflex  Vessel is large.  Prox Cx to Mid Cx lesion is 100% stenosed. The lesion is chronically occluded.  First Obtuse Marginal Branch  Vessel is large in size.  1st Mrg-1 lesion is 80% stenosed.  1st Mrg-2 lesion is 90% stenosed.  Third Obtuse Marginal Branch  Collaterals  3rd Mrg filled by collaterals from 1st Mrg.    Right Coronary Artery  Vessel is large.  Prox RCA lesion is 100% stenosed. The lesion is chronically occluded.  Third Right Posterolateral Branch  Collaterals  3rd RPL filled by collaterals from 2nd Sept.    Intervention  No interventions have been documented. Coronary Diagrams  Diagnostic Dominance: Right      I have independently  reviewed the above radiologic studies and discussed with the patient   Recent Lab Findings: Lab Results  Component Value Date   WBC 7.3 07/31/2019   HGB 13.9 07/31/2019   HCT 44.6 07/31/2019   PLT 186 07/31/2019   GLUCOSE 178 (H) 07/31/2019   CHOL 203 (H) 07/27/2019   TRIG 106 07/27/2019   HDL 34 (L) 07/27/2019   LDLCALC 148 (H) 07/27/2019   NA 134 (L) 07/31/2019   K 4.4 07/31/2019   CL 95 (L) 07/31/2019   CREATININE 1.15 07/31/2019   BUN 20 07/31/2019   CO2 30 07/31/2019   TSH 2.243 07/31/2019   HGBA1C 9.0 (H) 07/26/2019      Assessment / Plan:    -Severe multivessel coronary artery disease presenting with acute systolic congestive heart failure and volume overload.  He has improved symptomatically following aggressive diuresis over the past few days.  Echocardiogram repeated this admission shows global left ventricular hypokinesis with distinct regional wall motion abnormalities.  There is akinesis and evidence of scarring in the inferior wall.  There is severe hypokinesis of the apex and mid apical anterior septum.  Ejection fraction was estimated 20 to 25% on   his most recent echo obtained on 07/27/2019.  Small pericardial effusion is present.  There was mild to moderate mitral regurgitation and otherwise no significant valvular disease.  -Acute on chronic renal insufficiency.  Baseline creatinine was 1.4 and increased to 1.67 in response to diuresis.  His creatinine has recovered to 1.15.  -Type 2 diabetes mellitus-hemoglobin A1c was 9.0 on admission.  Control has been suboptimal since admission.  -History of major depression and chronic pain syndrome.  He is comfortable presently.  His Celexa has been resumed    Plan: Coronary bypass grafting is his best option for survival and preservation of his cardiac function.  The general nature of the surgery and recovery were discussed with the patient and his wife and their questions were answered.  Mr. Abdulaziz would like for us to  proceed with preoperative planning in preparation in anticipation of surgery tomorrow morning.     I  spent 25 minutes counseling the patient face to face.   Myron G. Roddenberry, PA-C  07/31/2019 11:41 AM  I reviewed the records and cath images and examined Mr. Lehrmann. I agree with the findings noted above. 63 yo man with multiple CRF but no prior history presents with decompensated systolic and diastolic left and right heart failure. C/w ischemic cardiomyopathy. W/u revealed severe LV dysfunction with an EF of 20-25% and mild to moderate MR. Cath showed severe 3 vessel CAD with total LAD, RCA and distal circumflex. Majority of heart perfused by collaterals from a diseased OM.   CABG is indicated for survival benefit and relief of symptoms.  I discussed the general nature of the procedure, including the need for general anesthesia, the incisions to be used, the use of cardiopulmonary bypass and the use of drainage tubes postoperatively with the Falks. We discussed the expected hospital stay, overall recovery and short and long term outcomes. Informed Mr and Mrs Mccuen of the indications, risks, benefits and alternatives. They understand the risks include, but are not limited to death, stroke, MI, DVT/PE, bleeding, possible need for transfusion, infections, cardiac arrhythmias, as well as other organ system dysfunction including respiratory, renal, or GI complications.   He has a normal Allen's test on the left. Will plan to use left radial for one of the grafts.  He accepts the risks and agrees to proceed.  For CABG on Thursday 6/24  History of bilateral CEA 7-8 years ago. Needs carotid duplex preop  Airianna Kreischer C. Lexie Morini, MD [facility] [phone]   

## 2019-07-31 NOTE — Progress Notes (Signed)
Family Medicine Teaching Service Daily Progress Note Intern Pager: [phone]  Patient name: Todd [name] Medical record number: [id number] Date of birth: 1955/08/02 Age: 64 y.o. Gender: male  Primary Care Provider: Bonnita Nasuti, MD Consultants: Cardiology Code Status: Full  Pt Overview and Major Events to Date:  6/17-patient admitted for CHF exacerbation 6/21-patient had heart cath  Assessment and Plan: Todd [name] is a 64 y.o. male  presenting with shortness of breath. PMH is significant for HFrEF, COPD, HTN, T2DM, HLD, MDD, chronic back pain, vitamin deficiency, BPH, tobacco use disorder, marijuana use disorder.  Acute respiratory failure   HFrEF exacerbation - improving Patient has diuresed 6.5 L since admission with 1 L over the last 24 hours..Lower extremity edema continues to improve.  Breathing comfortably on room air with evaluation.  Creatinine has improved this morning to 1.15 from 1.67 yesterday.  Echo this admission EF 20-25% with grade 1 diastolic dysfunction, severe hypokinesis at the apex as well as scarring in the RCA distribution.  Heart cath showed severe three-vessel disease with CAD. -Cardiology consulted, appreciate recommendations -IV Lasix 40 mg daily -Toprol-XL 25 mg for rate control -Creatinine at baseline today so considering ARB or Entresto if renal function remains stable but not at this time -Strict I's and O's -Daily weights -Continuous pulse ox -PT/OT: Home health PT/OT -ASA - K goal >4 (giving 40kdurx2 6/20) , mag goal >2 (ordered 2g IV 6/20)   CAD with severe three-vessel disease Heart cath on 07/30/2019 showed severe three-vessel disease with CAD.  Cardiology contacted CT surgery who recommended CABG.  CT surgery has seen patient and are planning for CABG tomorrow 6/23.  AKI -improving Baseline 1.1.  On admission 1.4 > 1.67> 1.65.  Fattening this morning was 1.15 which is approximately baseline. -IV Lasix 40 mg daily -Follow-up on morning  BMPs -Continue to hold home lisinopril -Avoid nephrotoxic agents    COPD -stable Diagnosis is in patient's chart but patient reports he has never been told he has COPD.  He is on no medications for this. -Monitor respiratory status -Consider outpatient PFTs after discharge - encourage smoking cessation - Albuterol PRN  -Respiratory is following  HTN-stable Normotensive.  On medication includes lisinopril, Lasix 20 mg.   - diuresis per above - holding lisinopril in setting of AKI  - f/u cardiology recs regarding future change to Entresto    T2DM-stable A1c 9 this admission.  Patient received 5 units aspart.  Home medications include glyburide-Metformin 2.5-500 mg twice daily, Farxiga 5 mg daily, pioglitazone 45 mg daily.   -Hold home medications -Starting Lantus 5 units nightly while in the hospital -Monitor CBGs -Moderate SSI -Patient will need to be restarted on Farxiga at discharge -Consider Ozempic or Trulicity outpatient  HLD Home medications include Livalo 4 mg daily.  -trial Crestor 20 mg daily (consider lipid clinic referral if not tolerated) -ASA 81  MDD-stable Home medications include citalopram 40 mg once daily -Continue home medications -Decreased to 20 mg   Chronic back pain-stable Home medications include oxycodone 15 mg 2 times daily, sometimes TID if pain is worse, gabapentin 300 mg 3 times daily. -Continue home medications as prescribed   BPH Stable.  No home medications -Monitor for urinary hesitance -Continue care outpatient - consider flomax if symptomatic  Tobacco use disorder Daily 2-2-1/2 pack/day smoker.  Patient and his wife are both planning to taper and quit soon. - encourage smoking cessation - nicotine patch while inpatient  Marijuana use disorder Daily marijuana smoker  FEN/GI: heart healthy  carb modified diet, replete electrolytes as needed PPx: Lovenox  Disposition: Pending cath today 6/21  Subjective:  Patient is  doing well today.  Denies any chest pain or shortness of breath.  I discussed the cath results with the patient.  I recommended he speak with the cardiologist and the cardiothoracic surgeons regarding the neck steps in his management although I did let him know they were recommending CABG.  Objective: Temp:  [97.6 F (36.4 C)-98.1 F (36.7 C)] 98 F (36.7 C) (06/22 0400) Pulse Rate:  [50-135] 83 (06/22 0400) Resp:  [9-35] 13 (06/22 0400) BP: (101-143)/(66-93) 117/70 (06/22 0400) SpO2:  [0 %-100 %] 94 % (06/22 0400) Weight:  [73.4 kg-73.5 kg] 73.5 kg (06/22 0443) Physical Exam: General: Sitting comfortably on the edge of the bed, no acute distress Cardiovascular: Regular rate and rhythm, 2+ pitting edema from the shins down. Respiratory: Normal work of breathing on room air, crackles continue to improve and lower lung bases. Extremities: 2+ pitting edema which starts mid shin, improved.  Laboratory: Recent Labs  Lab 07/29/19 0330 07/29/19 0330 07/30/19 0303 07/30/19 1824 07/30/19 1829 07/30/19 1831 07/31/19 0410  WBC 7.5  --  7.0  --   --   --  7.3  HGB 14.3   < > 13.5   < > 15.0 14.6 13.9  HCT 45.3   < > 43.0   < > 44.0 43.0 44.6  PLT 192  --  184  --   --   --  186   < > = values in this interval not displayed.   Recent Labs  Lab 07/29/19 0330 07/29/19 0330 07/30/19 0303 07/30/19 1824 07/30/19 1829 07/30/19 1831 07/31/19 0410  NA 133*   < > 135   < > 132* 138 134*  K 3.5   < > 4.1   < > 3.7 3.6 4.4  CL 89*  --  92*  --   --   --  95*  CO2 31  --  32  --   --   --  30  BUN 26*  --  28*  --   --   --  20  CREATININE 1.65*  --  1.67*  --   --   --  1.15  CALCIUM 8.6*  --  8.6*  --   --   --  8.6*  GLUCOSE 194*  --  241*  --   --   --  178*   < > = values in this interval not displayed.   Mag 1.2   Imaging/Diagnostic Tests: CARDIAC CATHETERIZATION  Result Date: 07/30/2019  Prox RCA lesion is 100% stenosed.  Prox Cx to Mid Cx lesion is 100% stenosed.  1st  Mrg-1 lesion is 80% stenosed.  1st Mrg-2 lesion is 90% stenosed.  Mid LAD lesion is 100% stenosed.  Ost LM to Mid LM lesion is 40% stenosed.  Ost LAD to Prox LAD lesion is 50% stenosed.  1st Diag lesion is 70% stenosed.  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 High grade disease in the Circumflex and OM CT surgery consult for CABG     Derrel Nip, MD 07/31/2019, 5:49 AM PGY-1, [facility]  FPTS Intern pager: [phone], text pages welcome

## 2019-07-31 NOTE — Progress Notes (Signed)
Progress Note  Patient Name: Todd [name] Date of Encounter: 07/31/2019  [facility] Cardiologist: No primary care provider on file.   Subjective   No acute overnight events. Cath showed severe 3 vessel disease and CT surgery has been consulted for CABG. No chest pain. Shortness of breath much improved but still has crackles on exam.  Inpatient Medications    Scheduled Meds: . aspirin EC  81 mg Oral Daily  . citalopram  20 mg Oral Daily  . insulin aspart  0-15 Units Subcutaneous TID WC  . insulin glargine  5 Units Subcutaneous QHS  . nicotine  21 mg Transdermal Daily  . rosuvastatin  20 mg Oral Daily  . sodium chloride flush  3 mL Intravenous Q12H  . sodium chloride flush  3 mL Intravenous Q12H  . umeclidinium bromide  1 puff Inhalation Daily   Continuous Infusions: . sodium chloride    . sodium chloride     PRN Meds: sodium chloride, sodium chloride, acetaminophen, albuterol, oxyCODONE, sodium chloride flush   Vital Signs    Vitals:   07/31/19 0400 07/31/19 0443 07/31/19 0748 07/31/19 0913  BP: 117/70  111/80   Pulse: 83  88   Resp: 13  16   Temp: 98 F (36.7 C)  98.2 F (36.8 C)   TempSrc: Oral  Oral   SpO2: 94%  100% 99%  Weight:  73.5 kg    Height:        Intake/Output Summary (Last 24 hours) at 07/31/2019 0940 Last data filed at 07/31/2019 0440 Gross per 24 hour  Intake 84.1 ml  Output 1100 ml  Net -1015.9 ml   Last 3 Weights 07/31/2019 07/30/2019 07/29/2019  Weight (lbs) 162 lb 161 lb 12.8 oz 160 lb 9.6 oz  Weight (kg) 73.483 kg 73.392 kg 72.848 kg      Telemetry    Normal sinus rhythm with PVCs. Rates in the 80's to low 100's. - Personally Reviewed  ECG    No new ECG tracing today. - Personally Reviewed  Physical Exam   GEN: No acute distress.   Neck: Supple. No JVD Cardiac: RRR. No significant murmurs, rubs, or gallops. Right radial cath site soft with no signs of hematoma. Respiratory: No increased work of breathing. Crackles noted in  bilateral bases. GI: Soft, non-distended, and non-tender. Bowel sounds present. MS: 1+ pitting edema of bilateral lower extremities. No deformity. Skin: Warm and dry. Neuro:  No focal deficits. Psych: Normal affect. Responds appropriately.  Labs    High Sensitivity Troponin:   Recent Labs  Lab 07/26/19 1711 07/26/19 2016  TROPONINIHS 115* 115*      Chemistry Recent Labs  Lab 07/29/19 0330 07/29/19 0330 07/30/19 0303 07/30/19 1824 07/30/19 1829 07/30/19 1831 07/31/19 0410  NA 133*   < > 135   < > 132* 138 134*  K 3.5   < > 4.1   < > 3.7 3.6 4.4  CL 89*  --  92*  --   --   --  95*  CO2 31  --  32  --   --   --  30  GLUCOSE 194*  --  241*  --   --   --  178*  BUN 26*  --  28*  --   --   --  20  CREATININE 1.65*  --  1.67*  --   --   --  1.15  CALCIUM 8.6*  --  8.6*  --   --   --  8.6*  GFRNONAA 43*  --  42*  --   --   --  >60  GFRAA 50*  --  49*  --   --   --  >60  ANIONGAP 13  --  11  --   --   --  9   < > = values in this interval not displayed.     Hematology Recent Labs  Lab 07/29/19 0330 07/29/19 0330 07/30/19 0303 07/30/19 1824 07/30/19 1829 07/30/19 1831 07/31/19 0410  WBC 7.5  --  7.0  --   --   --  7.3  RBC 5.37  --  4.99  --   --   --  5.17  HGB 14.3   < > 13.5   < > 15.0 14.6 13.9  HCT 45.3   < > 43.0   < > 44.0 43.0 44.6  MCV 84.4  --  86.2  --   --   --  86.3  MCH 26.6  --  27.1  --   --   --  26.9  MCHC 31.6  --  31.4  --   --   --  31.2  RDW 15.0  --  14.9  --   --   --  15.0  PLT 192  --  184  --   --   --  186   < > = values in this interval not displayed.    BNP Recent Labs  Lab 07/26/19 1236  BNP 2,568.2*     DDimer No results for input(s): DDIMER in the last 168 hours.   Radiology    CARDIAC CATHETERIZATION  Result Date: 07/30/2019  Prox RCA lesion is 100% stenosed.  Prox Cx to Mid Cx lesion is 100% stenosed.  1st Mrg-1 lesion is 80% stenosed.  1st Mrg-2 lesion is 90% stenosed.  Mid LAD lesion is 100% stenosed.  Ost LM  to Mid LM lesion is 40% stenosed.  Ost LAD to Prox LAD lesion is 50% stenosed.  1st Diag lesion is 70% stenosed.  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 High grade disease in the Circumflex and OM CT surgery consult for CABG    Cardiac Studies   Echocardiogram 07/27/2019: Impressions: 1. There is global left ventricular hypokinesis, with distinct regional  variations. There is akinesis and hyperechogenicity of the entire inferior  septum and inferior wall (consistent with scar in the distribution of the  right coronary artery). There is  severe hypokinesis of the apex and mid-apical anterior septum, consistent  with severe ischemia or infarction in the distribution of the mid-LAD  artery. No intracavitary thrombus is seen. Left ventricular ejection  fraction, by estimation, is 20 to 25%.  The left ventricle has severely decreased function. The left ventricular  internal cavity size was mildly dilated. Left ventricular diastolic  parameters are consistent with Grade I diastolic dysfunction (impaired  relaxation). There is incessant ectopy  that limits diastolic function evaluation.  2. Right ventricular systolic function is moderately reduced. The right  ventricular size is normal. There is normal pulmonary artery systolic  pressure.  3. Left atrial size was mildly dilated.  4. The pericardial effusion is circumferential. There is no evidence of  cardiac tamponade. Large pleural effusion in the left lateral region.  5. The mitral valve is normal in structure. Mild to moderate mitral valve  regurgitation.  6. The aortic valve is tricuspid. Aortic valve regurgitation is not  visualized. Mild aortic  valve sclerosis is present, with no evidence of  aortic valve stenosis.  7. The inferior vena cava is dilated in size with >50% respiratory  variability, suggesting right atrial pressure of 8 mmHg. _______________  Right/Left Heart Catheterization  07/30/2019:  Prox RCA lesion is 100% stenosed.  Prox Cx to Mid Cx lesion is 100% stenosed.  1st Mrg-1 lesion is 80% stenosed.  1st Mrg-2 lesion is 90% stenosed.  Mid LAD lesion is 100% stenosed.  Ost LM to Mid LM lesion is 40% stenosed.  Ost LAD to Prox LAD lesion is 50% stenosed.  1st Diag lesion is 70% stenosed.   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 High grade disease in the Circumflex and OM  CT surgery consult for CABG   Patient Profile     64 y.o. male with a history of recently diagnosed CHF at outside hospital, carotid artery stenosis s/p bilateral CEA, hypertension, hyperlipidemia, type 2 diabetes mellitus, COPD, tobacco abuse, CKD stage 3, and depression who is being seen for acute on chronic combined CHF at the request of Dr. Sherry Ruffing.  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  - Chest x-ray consistent with CHF with cardiomegaly, pulmonary edema, and small effusion.  - Echo showed LVEF of 20-25% with global hypokinesis with akinesis and hyperechogenicity of the entire inferior septum and inferior wall (consistent with scar in the distribution of the RCA) as well as severe hypokinesis of the apex and mid-apical anterior septum (consitent with severe ischemia or infraction int he distribution of the mid LAD). - Initially started on IV Lasix 80mg  twice daily but had rise in creatinine; therefore, so this was decreased. Last dose was on 07/29/2019. Net negative 6.5 L this admission. Weight down 13 lbs since admission. Renal function improved today. - Still has some bibasilar crackles and lower extremity edema on exam.  - Will give another dose of IV Lasix 40mg  daily. - Will add Toprol-XL 25mg  daily for further rate control.  - Creatinine back to baseline today. Therefore, could also consider starting ARB or Entresto if renal function remains stable and BP allows after addition of  beta-blocker. - Continue to monitor daily weight, strict I/O's, and renal function  Severe 3-Vessel CAD - Cath on 07/30/2019 showed severe 3 vessel CAD.  - No chest pain. - CT surgery consulted. Called and confirmed that they are aware of patient. He is tentatively on board for CABG tomorrow.  Hypertension - BP well controlled without any antihypertensives.  - Start Toprol as above.  - Continue to monitor.  Hyperlipidemia - Lipid panel this admission: Total cholesterol 203, Triglycerides 106, HDL 34, LDL 148.  - Not on statin at home. Simvastatin listed as allergy. - Started on Crestor 20mg  daily this admission. If intolerant, can consider referral to lipid clinic.  Type 2 Diabetes Mellitus - Hemoglobin A1c 8.6 in 05/2019.  - On Farxiga and Glipizide-Metformin at home. - Inpatient management per primary team.  AKI on CKD Stage III - Creatinine 1.65 yesterday. Baseline reportedly around 1.1.  - Creatinine 1.15 today. - Continue to monitor.  Tobacco/Marijuana Abuse - Patient smoking 2-2.5 packs per day prior to admission. Also reported marijuana use. - Discussed importance of complete cessation.   For questions or updates, please contact [place] Please consult [email or web address] for contact info under        Signed, Darreld Mclean, PA-C  07/31/2019, 9:40 AM

## 2019-07-31 NOTE — H&P (View-Only) (Signed)
[address]       [place] [address]             [id number]        Kord Monette [facility] Medical Record #[id number] Date of Birth: [birth year]  Referring:  Todd Hazel, MD Primary Care: Todd Proffer, MD Primary Cardiologist:No primary care provider on file.  Chief Complaint:    Chief Complaint  Patient presents with  . Shortness of Breath   History of Present Illness:   Mr. Todd [name] is a 64 year old gentleman with past medical history significant for type 2 diabetes mellitus, dyslipidemia, spinal stenosis resulting in chronic pain syndrome, chronic renal insufficiency, and history of bilateral carotid artery stenoses requiring bilateral carotid endarterectomy 7 to 8 years ago.  He is also an active smoker and uses marijuana on a daily basis.  While he denies ever having any chest pain, he reports having progressive shortness of breath starting about 3 months ago.  This worsened to the point that he presented for evaluation to his primary care physician, Dr. Raphael [name].  An echocardiogram was performed which is reported to show reduced ejection fraction of 25 to 30% which was drastically reduced from an EF of around 60% on echo done about a year earlier.  Arrangements will be made for referral to Dr. Gala [name] for management of this newly diagnosed heart failure clinic because of worsening symptoms, Mr. Todd [name] presented to the [facility] emergency room on 617.  In the ED, he was noted to have significant peripheral edema and on chest x-ray had obvious pulmonary edema with cardiomegaly.  His BNP was elevated in excess of 2500.  Serial high-sensitivity troponin were 115x2.  His EKG showed sinus rhythm with anterior and anteroseptal Q waves.  He was admitted to the hospital for management of acute on chronic systolic heart failure.  He was diuresed aggressively which resulted in significant improvement in his breathing.  He did develop acute on chronic renal insufficiency  in response to diuresis and was given some time to recover prior to having left heart catheterization last evening.  This study demonstrated severe three-vessel coronary artery disease with chronic total occlusions of both the LAD and right coronary arteries.  Additionally, there were high-grade lesions involving the first and second obtuse marginals.  We have been asked to evaluate Mr. Todd [name] for consideration of surgical coronary revascularization. Mr. Todd [name] is currently resting in bed and appears comfortable.  His wife is at bedside.  He denies any pain or shortness of breath currently.  He is on room air and oxygen saturation is 100%.   Current Activity/ Functional Status:    Zubrod Score: At the time of surgery this patient's most appropriate activity status/level should be described as: []     0    Normal activity, no symptoms [x]     1    Restricted in physical strenuous activity but ambulatory, able to do out light work []     2    Ambulatory and capable of self care, unable to do work activities, up and about                 more than 50%  Of the time                            []     3    Only limited self care, in bed greater than 50% of waking  hours     4    Completely disabled, no self care, confined to bed or chair     5    Moribund  Past Medical History:  Diagnosis Date  . Arthritis   . CHF (congestive heart failure) (HCC)   . Depression   . Diabetes mellitus without complication (HCC)   . Hyperlipidemia   . Hypertension     Past Surgical History:  Procedure Laterality Date  . APPENDECTOMY  1995  . CAROTID ENDARTERECTOMY  2010  . RIGHT/LEFT HEART CATH AND CORONARY ANGIOGRAPHY N/A 07/30/2019   Procedure: RIGHT/LEFT HEART CATH AND CORONARY ANGIOGRAPHY;  Surgeon: Todd Hazel, MD;  Location: [facility] INVASIVE CV LAB;  Service: Cardiovascular;  Laterality: N/A;    Social History   Tobacco Use  Smoking Status Current Every Day Smoker  . Packs/day: 2.50  . Types:  Cigarettes    Social History   Substance and Sexual Activity  Alcohol Use None     Allergies  Allergen Reactions  . Empagliflozin     Other reaction(s): diarrhea  . Other     Other reaction(s): Unknown  . Simvastatin     Other reaction(s): diarrhea  . Sitagliptin     Other reaction(s): diarrhea/abd pain    Current Facility-Administered Medications  Medication Dose Route Frequency Provider Last Rate Last Admin  . 0.9 %  sodium chloride infusion  250 mL Intravenous PRN Todd Carrow D, MD      . 0.9 %  sodium chloride infusion  250 mL Intravenous PRN Todd Hazel, MD      . acetaminophen (TYLENOL) tablet 650 mg  650 mg Oral Q4H PRN Todd Hazel, MD      . albuterol (PROVENTIL) (2.5 MG/3ML) 0.083% nebulizer solution 3 mL  3 mL Inhalation Q6H PRN Todd Hazel, MD      . aspirin EC tablet 81 mg  81 mg Oral Daily Todd Hazel, MD   81 mg at 07/31/19 0854  . [START ON 08/01/2019] cefUROXime (ZINACEF) 1.5 g in sodium chloride 0.9 % 100 mL IVPB  1.5 g Intravenous To OR Todd Slot, MD      . Melene Muller ON 08/01/2019] cefUROXime (ZINACEF) 750 mg in sodium chloride 0.9 % 100 mL IVPB  750 mg Intravenous To OR Todd Slot, MD      . citalopram (CELEXA) tablet 20 mg  20 mg Oral Daily Todd Hazel, MD   20 mg at 07/31/19 0854  . [START ON 08/01/2019] dexmedetomidine (PRECEDEX) 400 MCG/100ML (4 mcg/mL) infusion  0.1-0.7 mcg/kg/hr Intravenous To OR Todd Slot, MD      . Melene Muller ON 08/01/2019] EPINEPHrine (ADRENALIN) 4 mg in NS 250 mL (0.016 mg/mL) premix infusion  0-10 mcg/min Intravenous To OR Todd Slot, MD      . Melene Muller ON 08/01/2019] heparin 30,000 units/NS 1000 mL solution for CELLSAVER   Other To OR Todd Slot, MD      . Melene Muller ON 08/01/2019] heparin sodium (porcine) 2,500 Units, papaverine 30 mg in electrolyte-148 (PLASMALYTE-148) 500 mL irrigation   Irrigation To OR Todd Slot,  MD      . insulin aspart (novoLOG) injection 0-15 Units  0-15 Units Subcutaneous TID WC Todd Hazel, MD   3 Units at 07/31/19 1121  . insulin glargine (LANTUS) injection 5 Units  5 Units Subcutaneous QHS Todd Hazel, MD   5 Units at 07/30/19 2149  . [START ON 08/01/2019] insulin regular, human (MYXREDLIN)  100 units/ 100 mL infusion   Intravenous To OR Todd Slot, MD      . Melene Muller ON 08/01/2019] magnesium sulfate (IV Push/IM) injection 40 mEq  40 mEq Other To OR Todd Slot, MD      . metoprolol succinate (TOPROL-XL) 24 hr tablet 25 mg  25 mg Oral Daily Marjie Skiff E, PA-C   25 mg at 07/31/19 1114  . [START ON 08/01/2019] milrinone (PRIMACOR) 20 MG/100 ML (0.2 mg/mL) infusion  0.3 mcg/kg/min Intravenous To OR Todd Slot, MD      . nicotine (NICODERM CQ - dosed in mg/24 hours) patch 21 mg  21 mg Transdermal Daily Todd Hazel, MD      . Melene Muller ON 08/01/2019] nitroGLYCERIN 50 mg in dextrose 5 % 250 mL (0.2 mg/mL) infusion  2-200 mcg/min Intravenous To OR Todd Slot, MD      . Melene Muller ON 08/01/2019] norepinephrine (LEVOPHED) 4mg  in premix infusion  0-40 mcg/min Intravenous To OR , MD      . oxyCODONE (Oxy IR/ROXICODONE) immediate release tablet 15 mg  15 mg Oral Q8H PRN Todd Slot, MD   15 mg at 07/31/19 0853  . [START ON 08/01/2019] phenylephrine (NEOSYNEPHRINE) 20-0.9 MG/250ML-% infusion  30-200 mcg/min Intravenous To OR 08/03/2019, MD      . Todd [name] ON 08/01/2019] potassium chloride injection 80 mEq  80 mEq Other To OR 08/03/2019, MD      . rosuvastatin (CRESTOR) tablet 20 mg  20 mg Oral Daily Todd Slot, MD   20 mg at 07/31/19 0853  . sodium chloride flush (NS) 0.9 % injection 3 mL  3 mL Intravenous Q12H 08/02/19, MD   3 mL at 07/31/19 0856  . sodium chloride flush (NS) 0.9 % injection 3 mL  3 mL Intravenous Q12H 08/02/19, MD    3 mL at 07/31/19 0856  . sodium chloride flush (NS) 0.9 % injection 3 mL  3 mL Intravenous PRN 08/02/19, MD      . Todd [name] ON 08/01/2019] tranexamic acid (CYKLOKAPRON) 2,500 mg in sodium chloride 0.9 % 250 mL (10 mg/mL) infusion  1.5 mg/kg/hr Intravenous To OR 08/03/2019, MD      . Todd [name] ON 08/01/2019] tranexamic acid (CYKLOKAPRON) bolus via infusion - over 30 minutes 1,102.5 mg  15 mg/kg Intravenous To OR 08/03/2019, MD      . Todd [name] ON 08/01/2019] tranexamic acid (CYKLOKAPRON) pump prime solution 147 mg  2 mg/kg Intracatheter To OR 08/03/2019, MD      . umeclidinium bromide (INCRUSE ELLIPTA) 62.5 MCG/INH 1 puff  1 puff Inhalation Daily Todd Slot, MD   1 puff at 07/31/19 0912  . [START ON 08/01/2019] vancomycin (VANCOREADY) IVPB 1250 mg/250 mL  1,250 mg Intravenous To OR 08/03/2019, MD        Medications Prior to Admission  Medication Sig Dispense Refill Last Dose  . citalopram (CELEXA) 40 MG tablet Take 40 mg by mouth daily.   07/25/2019 at Unknown time  . dapagliflozin propanediol (FARXIGA) 10 MG TABS tablet Take 10 mg by mouth daily.   07/25/2019 at Unknown time  . ergocalciferol (VITAMIN D2) 1.25 MG (50000 UT) capsule Take 50,000 Units by mouth 2 (two) times a week.   Past Week at Unknown time  . furosemide (LASIX) 20 MG tablet Take 40 mg by mouth daily.   07/25/2019 at Unknown time  .  glipiZIDE-metformin (METAGLIP) 2.5-500 MG tablet Take 2 tablets by mouth 2 (two) times daily with a meal.   07/25/2019 at Unknown time  . ibuprofen (ADVIL) 200 MG tablet Take 400 mg by mouth 2 (two) times daily as needed for moderate pain.   Past Week at Unknown time  . levofloxacin (LEVAQUIN) 750 MG tablet Take 750 mg by mouth daily. For 14 days.   07/25/2019 at Unknown time  . lisinopril (ZESTRIL) 20 MG tablet Take 20 mg by mouth daily.   Past Week at Unknown time  . oxyCODONE (ROXICODONE) 15 MG immediate release tablet Take 15 mg by mouth 3  (three) times daily.   07/25/2019 at Unknown time  . pioglitazone (ACTOS) 45 MG tablet Take 45 mg by mouth daily.   07/25/2019 at Unknown time  . promethazine (PHENERGAN) 25 MG tablet Take 25 mg by mouth 2 (two) times daily as needed for nausea or vomiting.   Past Week at Unknown time    Family History  Problem Relation Age of Onset  . Heart disease Mother   . Hypertension Mother      Review of Systems:   ROS     Cardiac Review of Systems: Y or  [    ]= no  Chest Pain [    ]  Resting SOB [x   ] Exertional SOB  [x  ]  Orthopnea [  ]   Pedal Edema [ x  ]    Palpitations [  ] Syncope  [  ]   Presyncope [   ]  General Review of Systems: [Y] = yes [  ]=no Constitional: recent weight change [  ]; anorexia [  ]; fatigue [  ]; nausea [  ]; night sweats [  ]; fever [  ]; or chills [  ]                                                                Eye : blurred vision [  ]; diplopia [   ]; vision changes [  ];  Amaurosis fugax[  ]; Resp: cough [  ];  wheezing[  ];  hemoptysis[  ]; shortness of breath[x  ]; paroxysmal nocturnal dyspnea[  ]; dyspnea on exertion[  ]; or orthopnea[  ];  GI:  gallstones[  ], vomiting[  ];  dysphagia[  ]; melena[  ];  hematochezia [  ]; heartburn[  ];   Hx of  Colonoscopy[  ]; GU: kidney stones [  ]; hematuria[  ];   dysuria [  ];  nocturia[  ];  history of     obstruction [  ]; urinary frequency [  ]             Skin: rash, swelling[  ];, hair loss[  ];  peripheral edema[  ];  or itching[  ]; Musculosketetal: myalgias[  ];  joint swelling[  ];  joint erythema[  ];  joint pain[  ];  back pain[x  ];  Heme/Lymph: bruising[  ];  bleeding[  ];  anemia[  ];  Neuro: TIA[  ];  headaches[  ];  stroke[  ];  vertigo[  ];  seizures[  ];   paresthesias[  ];  difficulty walking[  ];  Psych:depression[x  ]; anxiety[  ];  Endocrine: diabetes[ x ];  thyroid dysfunction[  ];            Physical Exam: BP 121/76 (BP Location: Right Arm)   Pulse 91   Temp 98 F (36.7 C) (Oral)    Resp 16   Ht 5\' 3"  (1.6 m)   Wt 73.5 kg   SpO2 100%   BMI 28.70 kg/m    General appearance: alert, cooperative and no distress Head: Normocephalic, without obvious abnormality, atraumatic Neck: no adenopathy, no carotid bruit, no JVD, supple, symmetrical, trachea midline and thyroid not enlarged, symmetric, no tenderness/mass/nodules Lymph nodes: Cervical, supraclavicular, and axillary nodes normal. and No obvious lymphadenopathy Resp: Few expiratory crackles.  No wheezes. Cardio: regular rate and rhythm, S1, S2 normal, no murmur, click, rub or gallop GI: Soft, nontender, active bowel sounds. Extremities: All extremities are well perfused.  There is a bulky dressing applied over the right radial artery where he was cathed last evening.  Distal perfusion is excellent. Neurologic: Grossly normal  Diagnostic Studies & Laboratory data:     Recent Radiology Findings:   ECHOCARDIOGRAM REPORT       Patient Name:  Todd [name] Date of Exam: 07/27/2019  Medical Rec #: [id number] Height:    63.0 in  Accession #:  [id number] Weight:    169.8 lb  Date of Birth: Jun 17, 1955 BSA:     1.803 m  Patient Age:  63 years  BP:      114/75 mmHg  Patient Gender: M     HR:      99 bpm.  Exam Location: Inpatient   Procedure: 2D Echo, 3D Echo, Color Doppler and Cardiac Doppler   Indications:  I50.20* Unspecified systolic (congestive) heart failure    History:    Patient has no prior history of Echocardiogram  examinations. CHF         and Systolic heart failure; Risk Factors:Hypertension and         Dyslipidemia. Patient had echo 4 days ago at another  facility.    Sonographer:  Sheralyn Boatman RDCS  Referring Phys: [id number] Beatriz Stallion   IMPRESSIONS    1. There is global left ventricular hypokinesis, with distinct regional  variations. There is akinesis and hyperechogenicity of the entire inferior  septum and inferior wall  (consistent with scar in the distribution of the  right coronary artery). There is  severe hypokinesis of the apex and mid-apical anterior septum, consistent  with severe ischemia or infarction in the distribution of the mid-LAD  artery. No intracavitary thrombus is seen. Left ventricular ejection  fraction, by estimation, is 20 to 25%.  The left ventricle has severely decreased function. The left ventricular  internal cavity size was mildly dilated. Left ventricular diastolic  parameters are consistent with Grade I diastolic dysfunction (impaired  relaxation). There is incessant ectopy  that limits diastolic function evaluation.  2. Right ventricular systolic function is moderately reduced. The right  ventricular size is normal. There is normal pulmonary artery systolic  pressure.  3. Left atrial size was mildly dilated.  4. The pericardial effusion is circumferential. There is no evidence of  cardiac tamponade. Large pleural effusion in the left lateral region.  5. The mitral valve is normal in structure. Mild to moderate mitral valve  regurgitation.  6. The aortic valve is tricuspid. Aortic valve regurgitation is not  visualized. Mild aortic valve sclerosis is present, with no evidence of  aortic valve stenosis.  7. The inferior vena cava is dilated in  size with >50% respiratory  variability, suggesting right atrial pressure of 8 mmHg.   FINDINGS  Left Ventricle: There is global left ventricular hypokinesis, with  distinct regional variations. There is akinesis and hyperechogenicity of  the entire inferior septum and inferior wall (consistent with scar in the  distribution of the right coronary  artery). There is severe hypokinesis of the apex and mid-apical anterior  septum, consistent with severe ischemia or infarction in the distribution  of the mid-LAD artery. No intracavitary thrombus is seen. Left ventricular  ejection fraction, by  estimation, is 20 to 25%. The  left ventricle has severely decreased  function. The left ventricle demonstrates regional wall motion  abnormalities. The left ventricular internal cavity size was mildly  dilated. There is no left ventricular hypertrophy. Left  ventricular diastolic parameters are consistent with Grade I diastolic  dysfunction (impaired relaxation). Normal left ventricular filling  pressure.   Right Ventricle: The right ventricular size is normal. No increase in  right ventricular wall thickness. Right ventricular systolic function is  moderately reduced. There is normal pulmonary artery systolic pressure.  The tricuspid regurgitant velocity is  1.88 m/s, and with an assumed right atrial pressure of 8 mmHg, the  estimated right ventricular systolic pressure is 22.1 mmHg.   Left Atrium: Left atrial size was mildly dilated.   Right Atrium: Right atrial size was normal in size.   Pericardium: A small pericardial effusion is present. The pericardial  effusion is circumferential. There is no evidence of cardiac tamponade.   Mitral Valve: The mitral valve is normal in structure. Mild mitral annular  calcification. Mild to moderate mitral valve regurgitation, with  centrally-directed jet.   Tricuspid Valve: The tricuspid valve is normal in structure. Tricuspid  valve regurgitation is not demonstrated.   Aortic Valve: The aortic valve is tricuspid. Aortic valve regurgitation is  not visualized. Mild aortic valve sclerosis is present, with no evidence  of aortic valve stenosis.   Pulmonic Valve: The pulmonic valve was grossly normal. Pulmonic valve  regurgitation is not visualized.   Aorta: The aortic root and ascending aorta are structurally normal, with  no evidence of dilitation.   Venous: The inferior vena cava is dilated in size with greater than 50%  respiratory variability, suggesting right atrial pressure of 8 mmHg.   IAS/Shunts: No atrial level shunt detected by color flow Doppler.    Additional Comments: There is a large pleural effusion in the left lateral  region.     LEFT VENTRICLE  PLAX 2D  LVIDd:     5.90 cm   Diastology  LVIDs:     5.30 cm   LV e' lateral:  9.68 cm/s  LV PW:     0.80 cm   LV E/e' lateral: 9.8  LV IVS:    1.40 cm  LVOT diam:   2.00 cm  LV SV:     51  LV SV Index:  28  LVOT Area:   3.14 cm    LV Volumes (MOD)  LV vol [name], MOD A2C: 190.5 ml  LV vol [name], MOD A4C: 105.0 ml  LV vol s, MOD A2C: 126.0 ml  LV vol s, MOD A4C: 98.6 ml  LV SV MOD A2C:   64.5 ml  LV SV MOD A4C:   105.0 ml  LV SV MOD BP:   30.1 ml   RIGHT VENTRICLE      IVC  RV S prime:   7.28 cm/s IVC diam: 2.10 cm  TAPSE (M-mode):  1.1 cm   LEFT ATRIUM       Index    RIGHT ATRIUM      Index  LA diam:    3.90 cm 2.16 cm/m RA Area:   12.30 cm  LA Vol (A2C):  52.9 ml 29.33 ml/m RA Volume:  30.10 ml 16.69 ml/m  LA Vol (A4C):  35.7 ml 19.80 ml/m  LA Biplane Vol: 42.4 ml 23.51 ml/m  AORTIC VALVE  LVOT Vmax:  109.00 cm/s  LVOT Vmean: 67.500 cm/s  LVOT VTI:  0.161 m    AORTA  Ao Root diam: 3.20 cm  Ao Asc diam: 2.80 cm   MITRAL VALVE        TRICUSPID VALVE  MV Area (PHT): 4.19 cm   TR Peak grad:  14.1 mmHg  MV Decel Time: 181 msec   TR Vmax:    188.00 cm/s  MV E velocity: 95.10 cm/s  MV A velocity: 119.00 cm/s SHUNTS  MV E/A ratio: 0.80     Systemic VTI: 0.16 m               Systemic Diam: 2.00 cm   Rachelle Hora Croitoru MD  Electronically signed by Thurmon Fair MD  Signature Date/Time: 07/27/2019/3:38:30 PM       RIGHT/LEFT HEART CATH AND CORONARY ANGIOGRAPHY  Conclusion    Prox RCA lesion is 100% stenosed.  Prox Cx to Mid Cx lesion is 100% stenosed.  1st Mrg-1 lesion is 80% stenosed.  1st Mrg-2 lesion is 90% stenosed.  Mid LAD lesion is 100% stenosed.  Ost LM to Mid LM lesion is 40% stenosed.  Ost LAD to Prox LAD lesion is 50%  stenosed.  1st Diag lesion is 70% stenosed.   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 High grade disease in the Circumflex and OM  CT surgery consult for CABG  Recommendations  Antiplatelet/Anticoag Diffuse 3 vessel CAD. Will get CT surgery consult for CABG  Indications  Acute systolic CHF (congestive heart failure), NYHA class 3 (HCC) [I50.21 (ICD-10-CM)]  Ischemic cardiomyopathy [I25.5 (ICD-10-CM)]  Procedural Details  Technical Details Indication: Cardiomyopathy, acute systolic CHF  Procedure: The risks, benefits, complications, treatment options, and expected outcomes were discussed with the patient. The patient and/or family concurred with the proposed plan, giving informed consent. The patient was brought to the cath lab after IV hydration was given. The patient was  sedated with Versed and Fentanyl. The IV catheter in the right antecubital vein was changed for a 7 [place] sheath. Right heart catheterization performed with a balloon tipped catheter. The right wrist was prepped and draped in a sterile fashion. 1% lidocaine was used for local anesthesia. Using the modified Seldinger access technique, a 5 French sheath was placed in the right radial artery. 3 mg Verapamil was given through the sheath. 4000 units IV heparin was given. Standard diagnostic catheters were used to perform selective coronary angiography. LV pressures measured with the JR4 catheter. The sheath was removed from the right radial artery and a Terumo hemostasis band was applied at the arteriotomy site on the right wrist.    Estimated blood loss <50 mL.   During this procedure medications were administered to achieve and maintain moderate conscious sedation while the patient's heart rate, blood pressure, and oxygen saturation were continuously monitored and I was present face-to-face 100% of this time.  Medications (Filter: Administrations occurring from 1748 to 1845 on  07/30/19) (important) Continuous medications are totaled by the amount administered until  07/30/19 1845.  Heparin (Porcine) in NaCl 1000-0.9 UT/500ML-% SOLN (mL) Total volume:  1,000 mL  Date/Time  Rate/Dose/Volume Action  07/30/19 1755  500 mL Given  1755  500 mL Given    fentaNYL (SUBLIMAZE) injection (mcg) Total dose:  50 mcg  Date/Time  Rate/Dose/Volume Action  07/30/19 1806  50 mcg Given    midazolam (VERSED) injection (mg) Total dose:  2 mg  Date/Time  Rate/Dose/Volume Action  07/30/19 1806  2 mg Given    Radial Cocktail/Verapamil only (mL) Total volume:  10 mL  Date/Time  Rate/Dose/Volume Action  07/30/19 1807  10 mL Given    heparin sodium (porcine) injection (Units) Total dose:  4,000 Units  Date/Time  Rate/Dose/Volume Action  07/30/19 1812  4,000 Units Given    lidocaine (PF) (XYLOCAINE) 1 % injection (mL) Total volume:  5 mL  Date/Time  Rate/Dose/Volume Action  07/30/19 1804  5 mL Given    iohexol (OMNIPAQUE) 350 MG/ML injection (mL) Total volume:  30 mL  Date/Time  Rate/Dose/Volume Action  07/30/19 1835  30 mL Given    0.9 % sodium chloride infusion (mL) Total dose:  Cannot be calculated* Dosing weight:  79.4  *Administration dose not documented Date/Time  Rate/Dose/Volume Action  07/30/19 1748  *Not included in total MAR Hold    acetaminophen (TYLENOL) tablet 650 mg (mg) Total dose:  Cannot be calculated* Dosing weight:  79.4  *Administration dose not documented Date/Time  Rate/Dose/Volume Action  07/30/19 1748  *Not included in total MAR Hold    albuterol (PROVENTIL) (2.5 MG/3ML) 0.083% nebulizer solution 3 mL (mL) Total dose:  Cannot be calculated* Dosing weight:  74.9  *Administration dose not documented Date/Time  Rate/Dose/Volume Action  07/30/19 1748  *Not included in total MAR Hold    aspirin EC tablet 81 mg (mg) Total dose:  Cannot be calculated* Dosing weight:  74.9  *Administration dose not documented Date/Time   Rate/Dose/Volume Action  07/30/19 1748  *Not included in total MAR Hold    citalopram (CELEXA) tablet 20 mg (mg) Total dose:  Cannot be calculated* Dosing weight:  74.9  *Administration dose not documented Date/Time  Rate/Dose/Volume Action  07/30/19 1748  *Not included in total MAR Hold    insulin aspart (novoLOG) injection 0-15 Units (Units) Total dose:  Cannot be calculated* Dosing weight:  79.4  *Administration dose not documented Date/Time  Rate/Dose/Volume Action  07/30/19 1748  *Not included in total MAR Hold    insulin glargine (LANTUS) injection 5 Units (Units) Total dose:  Cannot be calculated* Dosing weight:  73.4  *Administration dose not documented Date/Time  Rate/Dose/Volume Action  07/30/19 1748  *Not included in total MAR Hold    nicotine (NICODERM CQ - dosed in mg/24 hours) patch 21 mg (mg) Total dose:  Cannot be calculated* Dosing weight:  72.8  *Administration dose not documented Date/Time  Rate/Dose/Volume Action  07/30/19 1748  *Not included in total MAR Hold    oxyCODONE (Oxy IR/ROXICODONE) immediate release tablet 15 mg (mg) Total dose:  Cannot be calculated* Dosing weight:  79.4  *Administration dose not documented Date/Time  Rate/Dose/Volume Action  07/30/19 1748  *Not included in total MAR Hold    rosuvastatin (CRESTOR) tablet 20 mg (mg) Total dose:  Cannot be calculated* Dosing weight:  77  *Administration dose not documented Date/Time  Rate/Dose/Volume Action  07/30/19 1748  *Not included in total MAR Hold    sodium chloride flush (NS) 0.9 % injection 3 mL (mL) Total dose:  Cannot be calculated* Dosing  weight:  77  *Administration dose not documented Date/Time  Rate/Dose/Volume Action  07/30/19 1748  *Not included in total MAR Hold    umeclidinium bromide (INCRUSE ELLIPTA) 62.5 MCG/INH 1 puff (puff) Total dose:  Cannot be calculated* Dosing weight:  72.8  *Administration dose not documented Date/Time  Rate/Dose/Volume Action   07/30/19 1748  *Not included in total MAR Hold    enoxaparin (LOVENOX) injection 40 mg (mg) Total dose:  Cannot be calculated* Dosing weight:  79.4  *Administration dose not documented Date/Time  Rate/Dose/Volume Action  07/30/19 1748  *Not included in total MAR Hold    Sedation Time  Sedation Time Physician-1: 26 minutes 16 seconds  Contrast  Medication Name Total Dose  iohexol (OMNIPAQUE) 350 MG/ML injection 30 mL    Radiation/Fluoro  Fluoro time: 5.1 (min) DAP: 41338 (mGycm2) Cumulative Air Kerma: 270 (mGy)  Complications  Complications documented before study signed (07/30/2019 6:45 PM)   RIGHT/LEFT HEART CATH AND CORONARY ANGIOGRAPHY  None Documented by Todd Hazel, MD 07/30/2019 6:41 PM  Date Found: 07/30/2019  Time Range: Intraprocedure      Coronary Findings  Diagnostic Dominance: Right Left Main  Ost LM to Mid LM lesion is 40% stenosed.  Left Anterior Descending  Vessel is large.  Ost LAD to Prox LAD lesion is 50% stenosed.  Mid LAD lesion is 100% stenosed. The lesion is chronically occluded.  First Diagonal Branch  Vessel is moderate in size.  1st Diag lesion is 70% stenosed.  Third Diagonal Branch  Collaterals  3rd Diag filled by collaterals from 1st Diag.    Left Circumflex  Vessel is large.  Prox Cx to Mid Cx lesion is 100% stenosed. The lesion is chronically occluded.  First Obtuse Marginal Branch  Vessel is large in size.  1st Mrg-1 lesion is 80% stenosed.  1st Mrg-2 lesion is 90% stenosed.  Third Obtuse Marginal Branch  Collaterals  3rd Mrg filled by collaterals from 1st Mrg.    Right Coronary Artery  Vessel is large.  Prox RCA lesion is 100% stenosed. The lesion is chronically occluded.  Third Right Posterolateral Branch  Collaterals  3rd RPL filled by collaterals from 2nd Sept.    Intervention  No interventions have been documented. Coronary Diagrams  Diagnostic Dominance: Right      I have independently  reviewed the above radiologic studies and discussed with the patient   Recent Lab Findings: Lab Results  Component Value Date   WBC 7.3 07/31/2019   HGB 13.9 07/31/2019   HCT 44.6 07/31/2019   PLT 186 07/31/2019   GLUCOSE 178 (H) 07/31/2019   CHOL 203 (H) 07/27/2019   TRIG 106 07/27/2019   HDL 34 (L) 07/27/2019   LDLCALC 148 (H) 07/27/2019   NA 134 (L) 07/31/2019   K 4.4 07/31/2019   CL 95 (L) 07/31/2019   CREATININE 1.15 07/31/2019   BUN 20 07/31/2019   CO2 30 07/31/2019   TSH 2.243 07/31/2019   HGBA1C 9.0 (H) 07/26/2019      Assessment / Plan:    -Severe multivessel coronary artery disease presenting with acute systolic congestive heart failure and volume overload.  He has improved symptomatically following aggressive diuresis over the past few days.  Echocardiogram repeated this admission shows global left ventricular hypokinesis with distinct regional wall motion abnormalities.  There is akinesis and evidence of scarring in the inferior wall.  There is severe hypokinesis of the apex and mid apical anterior septum.  Ejection fraction was estimated 20 to 25% on  his most recent echo obtained on 07/27/2019.  Small pericardial effusion is present.  There was mild to moderate mitral regurgitation and otherwise no significant valvular disease.  -Acute on chronic renal insufficiency.  Baseline creatinine was 1.4 and increased to 1.67 in response to diuresis.  His creatinine has recovered to 1.15.  -Type 2 diabetes mellitus-hemoglobin A1c was 9.0 on admission.  Control has been suboptimal since admission.  -History of major depression and chronic pain syndrome.  He is comfortable presently.  His Celexa has been resumed    Plan: Coronary bypass grafting is his best option for survival and preservation of his cardiac function.  The general nature of the surgery and recovery were discussed with the patient and his wife and their questions were answered.  Mr. Star would like for [place] to  proceed with preoperative planning in preparation in anticipation of surgery tomorrow morning.     I  spent 25 minutes counseling the patient face to face.   Leary Roca, PA-C  07/31/2019 11:41 AM  I reviewed the records and cath images and examined Mr. Seal. I agree with the findings noted above. 64 yo man with multiple CRF but no prior history presents with decompensated systolic and diastolic left and right heart failure. C/w ischemic cardiomyopathy. W/u revealed severe LV dysfunction with an EF of 20-25% and mild to moderate MR. Cath showed severe 3 vessel CAD with total LAD, RCA and distal circumflex. Majority of heart perfused by collaterals from a diseased OM.   CABG is indicated for survival benefit and relief of symptoms.  I discussed the general nature of the procedure, including the need for general anesthesia, the incisions to be used, the use of cardiopulmonary bypass and the use of drainage tubes postoperatively with the Falks. We discussed the expected hospital stay, overall recovery and short and long term outcomes. Informed Mr and Mrs Conde of the indications, risks, benefits and alternatives. They understand the risks include, but are not limited to death, stroke, MI, DVT/PE, bleeding, possible need for transfusion, infections, cardiac arrhythmias, as well as other organ system dysfunction including respiratory, renal, or GI complications.   He has a normal Allen's test on the left. Will plan to use left radial for one of the grafts.  He accepts the risks and agrees to proceed.  For CABG on Thursday 6/24  History of bilateral CEA 7-8 years ago. Needs carotid duplex preop  Viviann Spare C. Dorris Fetch, MD [facility] [phone]

## 2019-07-31 NOTE — Progress Notes (Signed)
[id number] Gave pt IS and he demonstrated 600 ml with instruction. Discussed importance of IS and walking after surgery. Discussed staying in the tube and sternal precautions. Gave [facility] booklet and wrote down how to view pre op video. Wife will be available after discharge to assist with care. Understanding voiced by pt and wife. Will follow up after surgery. Luetta Nutting RN BSN 07/31/2019 2:07 PM

## 2019-08-01 ENCOUNTER — Encounter (HOSPITAL_COMMUNITY): Payer: Self-pay | Admitting: Family Medicine

## 2019-08-01 ENCOUNTER — Inpatient Hospital Stay (HOSPITAL_COMMUNITY): Payer: Medicare Other

## 2019-08-01 DIAGNOSIS — Z01810 Encounter for preprocedural cardiovascular examination: Secondary | ICD-10-CM

## 2019-08-01 LAB — CBC
HCT: 42.8 % (ref 39.0–52.0)
Hemoglobin: 13.2 g/dL (ref 13.0–17.0)
MCH: 26.9 pg (ref 26.0–34.0)
MCHC: 30.8 g/dL (ref 30.0–36.0)
MCV: 87.2 fL (ref 80.0–100.0)
Platelets: 181 10*3/uL (ref 150–400)
RBC: 4.91 MIL/uL (ref 4.22–5.81)
RDW: 14.8 % (ref 11.5–15.5)
WBC: 7.1 10*3/uL (ref 4.0–10.5)
nRBC: 0 % (ref 0.0–0.2)

## 2019-08-01 LAB — BASIC METABOLIC PANEL
Anion gap: 10 (ref 5–15)
BUN: 25 mg/dL — ABNORMAL HIGH (ref 8–23)
CO2: 32 mmol/L (ref 22–32)
Calcium: 9 mg/dL (ref 8.9–10.3)
Chloride: 94 mmol/L — ABNORMAL LOW (ref 98–111)
Creatinine, Ser: 1.38 mg/dL — ABNORMAL HIGH (ref 0.61–1.24)
GFR calc Af Amer: >60 mL/min (ref >60)
GFR calc non Af Amer: 53 mL/min — ABNORMAL LOW (ref >60)
Glucose, Bld: 168 mg/dL — ABNORMAL HIGH (ref 70–99)
Potassium: 4.5 mmol/L (ref 3.5–5.1)
Sodium: 136 mmol/L (ref 135–145)

## 2019-08-01 LAB — URINALYSIS, ROUTINE W REFLEX MICROSCOPIC
Bacteria, UA: NONE SEEN
Bilirubin Urine: NEGATIVE
Glucose, UA: NEGATIVE mg/dL
Hgb urine dipstick: NEGATIVE
Ketones, ur: NEGATIVE mg/dL
Leukocytes,Ua: NEGATIVE
Nitrite: NEGATIVE
Protein, ur: 100 mg/dL — AB
Specific Gravity, Urine: 1.02 (ref 1.005–1.030)
pH: 5 (ref 5.0–8.0)

## 2019-08-01 LAB — GLUCOSE, CAPILLARY
Glucose-Capillary: 209 mg/dL — ABNORMAL HIGH (ref 70–99)
Glucose-Capillary: 109 mg/dL — ABNORMAL HIGH (ref 70–99)
Glucose-Capillary: 313 mg/dL — ABNORMAL HIGH (ref 70–99)
Glucose-Capillary: 173 mg/dL — ABNORMAL HIGH (ref 70–99)

## 2019-08-01 LAB — MAGNESIUM: Magnesium: 2 mg/dL (ref 1.7–2.4)

## 2019-08-01 LAB — PROTIME-INR
INR: 1 (ref 0.8–1.2)
Prothrombin Time: 12.7 seconds (ref 11.4–15.2)

## 2019-08-01 LAB — ABO/RH: ABO/RH(D): A POS

## 2019-08-01 MED ORDER — TEMAZEPAM 15 MG PO CAPS: 15.0000 mg | ORAL_CAPSULE | Freq: Once | ORAL | Status: DC | PRN

## 2019-08-01 MED ORDER — MILRINONE LACTATE IN DEXTROSE 20-5 MG/100ML-% IV SOLN
0.3000 ug/kg/min | INTRAVENOUS | Status: AC
  Administered 2019-08-02: 3690 ug via INTRAVENOUS
  Filled 2019-08-01: qty 100

## 2019-08-01 MED ORDER — NOREPINEPHRINE 4 MG/250ML-% IV SOLN
0.0000 ug/min | INTRAVENOUS | Status: AC
  Administered 2019-08-02: 5 ug/min via INTRAVENOUS
  Filled 2019-08-01: qty 250

## 2019-08-01 MED ORDER — ADULT MULTIVITAMIN W/MINERALS CH: 1.0000 | ORAL_TABLET | Freq: Every day | ORAL | Status: DC

## 2019-08-01 MED ORDER — TRANEXAMIC ACID (OHS) PUMP PRIME SOLUTION
2.0000 mg/kg | INTRAVENOUS | Status: DC
  Filled 2019-08-01: qty 1.48

## 2019-08-01 MED ORDER — DIAZEPAM 5 MG PO TABS
5.0000 mg | ORAL_TABLET | Freq: Once | ORAL | Status: AC
  Administered 2019-08-02: 5 mg via ORAL
  Filled 2019-08-01: qty 1

## 2019-08-01 MED ORDER — SODIUM CHLORIDE 0.9 % IV SOLN
750.0000 mg | INTRAVENOUS | Status: AC
  Administered 2019-08-02: 750 mg via INTRAVENOUS
  Filled 2019-08-01: qty 750

## 2019-08-01 MED ORDER — POTASSIUM CHLORIDE 2 MEQ/ML IV SOLN
80.0000 meq | INTRAVENOUS | Status: DC
  Filled 2019-08-01: qty 40

## 2019-08-01 MED ORDER — PLASMA-LYTE 148 IV SOLN
INTRAVENOUS | Status: DC
  Filled 2019-08-01: qty 2.5

## 2019-08-01 MED ORDER — PHENYLEPHRINE HCL-NACL 20-0.9 MG/250ML-% IV SOLN
30.0000 ug/min | INTRAVENOUS | Status: AC
  Administered 2019-08-02: 25 ug/min via INTRAVENOUS
  Filled 2019-08-01: qty 250

## 2019-08-01 MED ORDER — PROCHLORPERAZINE MALEATE 5 MG PO TABS
5.0000 mg | ORAL_TABLET | Freq: Once | ORAL | Status: DC
  Filled 2019-08-01: qty 1

## 2019-08-01 MED ORDER — NITROGLYCERIN IN D5W 200-5 MCG/ML-% IV SOLN
2.0000 ug/min | INTRAVENOUS | Status: AC
  Administered 2019-08-02: 5 ug/min via INTRAVENOUS
  Filled 2019-08-01: qty 250

## 2019-08-01 MED ORDER — CHLORHEXIDINE GLUCONATE CLOTH 2 % EX PADS: 6.0000 | MEDICATED_PAD | Freq: Once | CUTANEOUS | Status: DC

## 2019-08-01 MED ORDER — BISACODYL 5 MG PO TBEC
5.0000 mg | DELAYED_RELEASE_TABLET | Freq: Once | ORAL | Status: AC
  Administered 2019-08-01: 5 mg via ORAL
  Filled 2019-08-01: qty 1

## 2019-08-01 MED ORDER — MAGNESIUM SULFATE 50 % IJ SOLN
40.0000 meq | INTRAMUSCULAR | Status: DC
  Filled 2019-08-01: qty 9.85

## 2019-08-01 MED ORDER — VANCOMYCIN HCL 1250 MG/250ML IV SOLN
1250.0000 mg | INTRAVENOUS | Status: AC
  Administered 2019-08-02: 1250 mg via INTRAVENOUS
  Filled 2019-08-01: qty 250

## 2019-08-01 MED ORDER — INSULIN REGULAR(HUMAN) IN NACL 100-0.9 UT/100ML-% IV SOLN
INTRAVENOUS | Status: AC
  Administered 2019-08-02: 2.6 [IU]/h via INTRAVENOUS
  Filled 2019-08-01: qty 100

## 2019-08-01 MED ORDER — SODIUM CHLORIDE 0.9 % IV SOLN
INTRAVENOUS | Status: DC
  Filled 2019-08-01: qty 30

## 2019-08-01 MED ORDER — TRANEXAMIC ACID (OHS) BOLUS VIA INFUSION
15.0000 mg/kg | INTRAVENOUS | Status: AC
  Administered 2019-08-02: 1107 mg via INTRAVENOUS
  Filled 2019-08-01: qty 1107

## 2019-08-01 MED ORDER — TRANEXAMIC ACID 1000 MG/10ML IV SOLN
1.5000 mg/kg/h | INTRAVENOUS | Status: AC
  Administered 2019-08-02: 1.5 mg/kg/h via INTRAVENOUS
  Filled 2019-08-01: qty 25

## 2019-08-01 MED ORDER — PROCHLORPERAZINE EDISYLATE 10 MG/2ML IJ SOLN
5.0000 mg | Freq: Once | INTRAMUSCULAR | Status: AC
  Administered 2019-08-01: 5 mg via INTRAVENOUS
  Filled 2019-08-01: qty 1

## 2019-08-01 MED ORDER — GLUCERNA SHAKE PO LIQD: 237.0000 mL | Freq: Three times a day (TID) | ORAL | Status: DC

## 2019-08-01 MED ORDER — EPINEPHRINE HCL 5 MG/250ML IV SOLN IN NS
0.0000 ug/min | INTRAVENOUS | Status: DC
  Filled 2019-08-01: qty 250

## 2019-08-01 MED ORDER — DEXMEDETOMIDINE HCL IN NACL 400 MCG/100ML IV SOLN
0.1000 ug/kg/h | INTRAVENOUS | Status: AC
  Administered 2019-08-02: .3 ug/kg/h via INTRAVENOUS
  Filled 2019-08-01: qty 100

## 2019-08-01 MED ORDER — METOPROLOL TARTRATE 12.5 MG HALF TABLET
12.5000 mg | ORAL_TABLET | Freq: Once | ORAL | Status: AC
  Administered 2019-08-02: 12.5 mg via ORAL
  Filled 2019-08-01: qty 1

## 2019-08-01 MED ORDER — SODIUM CHLORIDE 0.9 % IV SOLN
1.5000 g | INTRAVENOUS | Status: AC
  Administered 2019-08-02: 1.5 g via INTRAVENOUS
  Filled 2019-08-01: qty 1.5

## 2019-08-01 MED ORDER — CHLORHEXIDINE GLUCONATE 0.12 % MT SOLN
15.0000 mL | Freq: Once | OROMUCOSAL | Status: AC
  Administered 2019-08-02: 15 mL via OROMUCOSAL
  Filled 2019-08-01: qty 15

## 2019-08-01 NOTE — Progress Notes (Deleted)
Family Medicine Teaching Service Daily Progress Note Intern Pager: [phone]  Patient name: Todd [name] Medical record number: [id number] Date of birth: 11/26/1955 Age: 64 y.o. Gender: male  Primary Care Provider: Galvin Proffer, MD Consultants: Cardiology Code Status: Full  Pt Overview and Major Events to Date:  6/17-patient admitted for CHF exacerbation 6/21-patient had heart cath  Assessment and Plan: Todd [name] is a 64 y.o. male  presenting with shortness of breath. PMH is significant for HFrEF, COPD, HTN, T2DM, HLD, MDD, chronic back pain, vitamin deficiency, BPH, tobacco use disorder, marijuana use disorder.  Acute respiratory failure  HFrEF exacerbation - improving Patient has diuresed 7.1 L since admission with 1 L over the last 24 hours.  Patient denies any shortness of breath this morning.  Patient with a few crackles in lung bases but improved since admission.  Almost euvolemic on exam with pitting edema in left lower extremity but not as significant in right lower extremity.  Echo this admission EF 20-25% with grade 1 diastolic dysfunction, severe hypokinesis at the apex as well as scarring in the RCA distribution.  Heart cath showed severe three-vessel disease with CAD. -Cardiology consulted, appreciate recommendations -We will clarify Lasix with PA -Toprol-XL 25 mg for rate control -Creatinine at baseline today so considering ARB or Entresto if renal function remains stable but not at this time -Strict I's and O's -Daily weights -Continuous pulse ox -Compazine given for nausea -PT/OT: Home health PT/OT -ASA - K goal >4 (giving 40kdurx2 6/20) , mag goal >2 (ordered 2g IV 6/20)   CAD with severe three-vessel disease Heart cath on 07/30/2019 showed severe three-vessel disease with CAD.  Cardiology contacted CT surgery who recommended CABG. This morning patient reported having nausea and abdominal pain since approximately 4 AM this morning.  He says "I feel really weird" and  "am I going to die?".  Vital signs were stable during evaluation including an oxygen saturation of 100% on room air, pulse rate in the 80s, blood pressure was mildly elevated from where he has been at 140s/80s.  Repeat EKG showing stable ST elevation but new Q waves in lead III and aVF. CT surgery has seen patient and are planning for CABG tomorrow 6/24. -Cardiology following, appreciate recommendations -CABG scheduled for tomorrow, delayed because of an emergent case -We will clarify Lasix dose with cardiology  AKI -improving Baseline 1.1.  On admission 1.4   had improved to baseline with diuresis but this morning increased to 1.38 from 1.15. -We will discuss Lasix dose with cardiology -Follow-up on morning BMPs -Continue to hold home lisinopril -Avoid nephrotoxic agents    COPD -stable Diagnosis is in patient's chart but patient reports he has never been told he has COPD.  He is on no medications for this. -Monitor respiratory status -Consider outpatient PFTs after discharge - encourage smoking cessation - Albuterol PRN  -Respiratory is following  HTN-stable Normotensive.  On medication includes lisinopril, Lasix 20 mg.   - diuresis per above - holding lisinopril in setting of AKI  - f/u cardiology recs regarding future change to Entresto    T2DM-stable A1c 9 this admission.  Patient received 5 units aspart.  Home medications include glyburide-Metformin 2.5-500 mg twice daily, Farxiga 5 mg daily, pioglitazone 45 mg daily.   -Hold home medications -Starting Lantus 5 units nightly while in the hospital -Monitor CBGs -Moderate SSI -Patient will need to be restarted on Farxiga at discharge -Consider Ozempic or Trulicity outpatient  HLD Home medications include Livalo 4 mg daily.  -  trial Crestor 20 mg daily (consider lipid clinic referral if not tolerated) -ASA 81  MDD-stable Home medications include citalopram 40 mg once daily -Continue home medications -Decreased to 20  mg   Chronic back pain-stable Home medications include oxycodone 15 mg 2 times daily, sometimes TID if pain is worse, gabapentin 300 mg 3 times daily. -Continue home medications as prescribed   BPH Stable.  No home medications -Monitor for urinary hesitance -Continue care outpatient - consider flomax if symptomatic  Tobacco use disorder Daily 2-2-1/2 pack/day smoker.  Patient and his wife are both planning to taper and quit soon. - encourage smoking cessation - nicotine patch while inpatient  Marijuana use disorder Daily marijuana smoker  FEN/GI: heart healthy carb modified diet, replete electrolytes as needed, n.p.o. at midnight PPx: Heparin in preparation for CABG, will be held at midnight  Disposition: Pending transfer to [facility] for CABG on 6/24  Subjective:  Patient concerns at this morning.  He says that he has had nausea for approximately 4 hours" something just feels wrong".  He keeps saying "I have been doing so good".  He reports that he is having nausea but has not vomited.  Denies any shortness of breath or chest pain.  Has much difficulty describing the feeling that he is having.  Also reports seeing spots.  EKG ordered  Objective: Temp:  [97.6 F (36.4 C)-98.2 F (36.8 C)] 98 F (36.7 C) (06/23 1108) Pulse Rate:  [76-85] 82 (06/23 1108) Resp:  [14-26] 18 (06/23 1108) BP: (97-112)/(62-82) 112/72 (06/23 1108) SpO2:  [93 %-100 %] 100 % (06/23 1108) Weight:  [73.8 kg] 73.8 kg (06/23 0500) Physical Exam: General: Laying in bed, reports nausea and concern Cardiovascular: Regular rate and rhythm, +1 pitting edema in left lower extremity and trace pitting edema in right lower extremity Respiratory: Normal work of breathing on room air, crackles in lung bases bilaterally Extremities: +1 pitting edema in left lower extremity and trace in right lower extremity, no tenderness to palpation, no erythema  Laboratory: Recent Labs  Lab 07/30/19 0303 07/30/19 1824  07/30/19 1831 07/31/19 0410 08/01/19 0227  WBC 7.0  --   --  7.3 7.1  HGB 13.5   < > 14.6 13.9 13.2  HCT 43.0   < > 43.0 44.6 42.8  PLT 184  --   --  186 181   < > = values in this interval not displayed.   Recent Labs  Lab 07/30/19 0303 07/30/19 1824 07/30/19 1831 07/31/19 0410 08/01/19 0844  NA 135   < > 138 134* 136  K 4.1   < > 3.6 4.4 4.5  CL 92*  --   --  95* 94*  CO2 32  --   --  30 32  BUN 28*  --   --  20 25*  CREATININE 1.67*  --   --  1.15 1.38*  CALCIUM 8.6*  --   --  8.6* 9.0  GLUCOSE 241*  --   --  178* 168*   < > = values in this interval not displayed.   Mag 1.2   Imaging/Diagnostic Tests: Doppler Pre CABG  Result Date: 08/01/2019 PREOPERATIVE VASCULAR EVALUATION  Indications:            Pre-CABG. Risk Factors:           Hypertension, hyperlipidemia, Diabetes, coronary artery  disease, PAD. Vascular Interventions: Prior bilateral CEA. Comparison Study:       none Performing Technologist: Jeb Levering Rvt, Rdms  Examination Guidelines: A complete evaluation includes B-mode imaging, spectral Doppler, color Doppler, and power Doppler as needed of all accessible portions of each vessel. Bilateral testing is considered an integral part of a complete examination. Limited examinations for reoccurring indications may be performed as noted.  Right Carotid Findings: +----------+--------+-------+--------+----------------------+------------------+           PSV cm/sEDV    StenosisDescribe              Comments                             cm/s                                                    +----------+--------+-------+--------+----------------------+------------------+ CCA Prox  58      12                                                      +----------+--------+-------+--------+----------------------+------------------+ CCA Distal66      17                                                       +----------+--------+-------+--------+----------------------+------------------+ ICA Prox  89      44     1-39%   heterogenous and      upper end of range                                  smooth                                   +----------+--------+-------+--------+----------------------+------------------+ ICA Distal74      33                                                      +----------+--------+-------+--------+----------------------+------------------+ ECA       74      9                                                       +----------+--------+-------+--------+----------------------+------------------+ Portions of this table do not appear on this page. +----------+--------+-------+----------------+------------+           PSV cm/sEDV cmsDescribe        Arm Pressure +----------+--------+-------+----------------+------------+ [id number]             Multiphasic, WNL             +----------+--------+-------+----------------+------------+ +---------+--------+--+--------+-+---------+  VertebralPSV cm/s21EDV cm/s8Antegrade +---------+--------+--+--------+-+---------+ Left Carotid Findings: +----------+--------+-------+--------+----------------------+------------------+           PSV cm/sEDV    StenosisDescribe              Comments                             cm/s                                                    +----------+--------+-------+--------+----------------------+------------------+ CCA Prox  75      19             heterogenous and      47% diam reduction                                  smooth                                   +----------+--------+-------+--------+----------------------+------------------+ CCA Distal109     26                                                      +----------+--------+-------+--------+----------------------+------------------+ ICA Prox  109     26     1-39%   heterogenous                              +----------+--------+-------+--------+----------------------+------------------+ ICA Distal52      15                                                      +----------+--------+-------+--------+----------------------+------------------+ ECA       64      8                                                       +----------+--------+-------+--------+----------------------+------------------+ +----------+--------+--------+----------------+------------+ SubclavianPSV cm/sEDV cm/sDescribe        Arm Pressure +----------+--------+--------+----------------+------------+           93              Multiphasic, WNL             +----------+--------+--------+----------------+------------+ +---------+--------+--+--------+--+---------+ VertebralPSV cm/s58EDV cm/s21Antegrade +---------+--------+--+--------+--+---------+  ABI Findings: +--------+------------------+-----+----------+--------+ Right   Rt Pressure (mmHg)IndexWaveform  Comment  +--------+------------------+-----+----------+--------+ [id number]                    triphasic          +--------+------------------+-----+----------+--------+ ATA     75                0.60 monophasic         +--------+------------------+-----+----------+--------+ PTA  absent             +--------+------------------+-----+----------+--------+ +--------+------------------+-----+----------+-------+ Left    Lt Pressure (mmHg)IndexWaveform  Comment +--------+------------------+-----+----------+-------+ [id number]                    triphasic         +--------+------------------+-----+----------+-------+ ATA     84                0.68 monophasic        +--------+------------------+-----+----------+-------+ PTA     73                0.59 monophasic        +--------+------------------+-----+----------+-------+  Right Doppler Findings: +--------+--------+-----+---------+--------+  Site    PressureIndexDoppler  Comments +--------+--------+-----+---------+--------+ [id number]          triphasic         +--------+--------+-----+---------+--------+ Radial               triphasic         +--------+--------+-----+---------+--------+ Ulnar                triphasic         +--------+--------+-----+---------+--------+  Left Doppler Findings: +--------+--------+-----+---------+--------+ Site    PressureIndexDoppler  Comments +--------+--------+-----+---------+--------+ [id number]          triphasic         +--------+--------+-----+---------+--------+ Radial               triphasic         +--------+--------+-----+---------+--------+ Ulnar                triphasic         +--------+--------+-----+---------+--------+  Summary: Right Carotid: Velocities in the right ICA are consistent with a 1-39% stenosis. Left Carotid: Velocities in the left ICA are consistent with a 1-39% stenosis. Right ABI: Resting right ankle-brachial index indicates moderate right lower extremity arterial disease. Left ABI: Resting left ankle-brachial index indicates moderate left lower extremity arterial disease.  Bilateral Extremity: Doppler waveforms remain within normal limits with compression bilaterally for the radial arteries. Doppler waveforms remain within normal limits with compression bilaterally for the ulnar arteries.     Preliminary      Derrel Nip, MD 08/01/2019, 2:58 PM PGY-1, [facility]  FPTS Intern pager: [phone], text pages welcome

## 2019-08-01 NOTE — Progress Notes (Signed)
Progress Note  Patient Name: Todd [name] Date of Encounter: 08/01/2019  [facility] Cardiologist: New to [facility]. Lives in [place].  Subjective   No acute overnight events. Had some nausea this morning but improving. Breathing greatly improved. Laying also completely flat when I walked in the room. No chest pain.  Inpatient Medications    Scheduled Meds: . aspirin EC  81 mg Oral Daily  . bisacodyl  5 mg Oral Once  . citalopram  20 mg Oral Daily  . epinephrine  0-10 mcg/min Intravenous To OR  . heparin  5,000 Units Subcutaneous Q8H  . heparin-papaverine-plasmalyte irrigation   Irrigation To OR  . insulin aspart  0-15 Units Subcutaneous TID WC  . insulin glargine  5 Units Subcutaneous QHS  . insulin   Intravenous To OR  . magnesium sulfate  40 mEq Other To OR  . metoprolol succinate  25 mg Oral Daily  . nicotine  21 mg Transdermal Daily  . phenylephrine  30-200 mcg/min Intravenous To OR  . potassium chloride  80 mEq Other To OR  . rosuvastatin  20 mg Oral Daily  . sodium chloride flush  3 mL Intravenous Q12H  . sodium chloride flush  3 mL Intravenous Q12H  . tranexamic acid  15 mg/kg Intravenous To OR  . tranexamic acid  2 mg/kg Intracatheter To OR  . umeclidinium bromide  1 puff Inhalation Daily   Continuous Infusions: . sodium chloride    . sodium chloride    . cefUROXime (ZINACEF)  IV    . cefUROXime (ZINACEF)  IV    . dexmedetomidine    . heparin 30,000 units/NS 1000 mL solution for CELLSAVER    . milrinone    . nitroGLYCERIN    . norepinephrine    . tranexamic acid (CYKLOKAPRON) infusion ([facility])    . vancomycin     PRN Meds: sodium chloride, sodium chloride, acetaminophen, albuterol, oxyCODONE, sodium chloride flush   Vital Signs    Vitals:   08/01/19 0415 08/01/19 0500 08/01/19 0836 08/01/19 0857  BP: 112/82   110/75  Pulse: 85   84  Resp: 19   19  Temp:      TempSrc:    Oral  SpO2: 98%  100% 100%  Weight:  73.8 kg    Height:        Intake/Output  Summary (Last 24 hours) at 08/01/2019 1011 Last data filed at 07/31/2019 2150 Gross per 24 hour  Intake 320 ml  Output 1050 ml  Net -730 ml   Last 3 Weights 08/01/2019 07/31/2019 07/30/2019  Weight (lbs) 162 lb 12.8 oz 162 lb 161 lb 12.8 oz  Weight (kg) 73.846 kg 73.483 kg 73.392 kg      Telemetry    Normal sinus rhythm. Frequent PVCs (sometimes in bigeminy or trigeminy pattern). 5 beats of NSVT noted yesterday afternoon. - Personally Reviewed  ECG    Normal sinus rhythm with new Q waves in leads III and aVF. Also has ST elevation V1-V3 (more prominent than on prior tracings). T wave inversions in lateral leads.  - Personally Reviewed  Physical Exam   GEN: No acute distress.   Neck: Supple. No JVD Cardiac: Irregular rhythm due to frequent ectopy. Normal rate. Soft systolic murmur noted in apex. No rubs or gallops.  Respiratory: No increased work of breathing. Crackles noted in bilateral bases. No wheezes or rhonchi. GI: Soft, non-distended, and non-tender. MS: Mild lower extremite edema (mostly around ankles now). No deformity. Skin: Warm and  dry. Neuro:  No focal deficits. Psych: Normal affect. Responds appropriately.   Labs    High Sensitivity Troponin:   Recent Labs  Lab 07/26/19 1711 07/26/19 2016  TROPONINIHS 115* 115*      Chemistry Recent Labs  Lab 07/30/19 0303 07/30/19 1824 07/30/19 1831 07/31/19 0410 08/01/19 0844  NA 135   < > 138 134* 136  K 4.1   < > 3.6 4.4 4.5  CL 92*  --   --  95* 94*  CO2 32  --   --  30 32  GLUCOSE 241*  --   --  178* 168*  BUN 28*  --   --  20 25*  CREATININE 1.67*  --   --  1.15 1.38*  CALCIUM 8.6*  --   --  8.6* 9.0  GFRNONAA 42*  --   --  >60 53*  GFRAA 49*  --   --  >60 >60  ANIONGAP 11  --   --  9 10   < > = values in this interval not displayed.     Hematology Recent Labs  Lab 07/30/19 0303 07/30/19 1824 07/30/19 1831 07/31/19 0410 08/01/19 0227  WBC 7.0  --   --  7.3 7.1  RBC 4.99  --   --  5.17 4.91  HGB  13.5   < > 14.6 13.9 13.2  HCT 43.0   < > 43.0 44.6 42.8  MCV 86.2  --   --  86.3 87.2  MCH 27.1  --   --  26.9 26.9  MCHC 31.4  --   --  31.2 30.8  RDW 14.9  --   --  15.0 14.8  PLT 184  --   --  186 181   < > = values in this interval not displayed.    BNP Recent Labs  Lab 07/26/19 1236  BNP 2,568.2*     DDimer No results for input(s): DDIMER in the last 168 hours.   Radiology    CARDIAC CATHETERIZATION  Result Date: 07/30/2019  Prox RCA lesion is 100% stenosed.  Prox Cx to Mid Cx lesion is 100% stenosed.  1st Mrg-1 lesion is 80% stenosed.  1st Mrg-2 lesion is 90% stenosed.  Mid LAD lesion is 100% stenosed.  Ost LM to Mid LM lesion is 40% stenosed.  Ost LAD to Prox LAD lesion is 50% stenosed.  1st Diag lesion is 70% stenosed.  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 High grade disease in the Circumflex and OM CT surgery consult for CABG    Cardiac Studies   Echocardiogram 07/27/2019: Impressions: 1. There is global left ventricular hypokinesis, with distinct regional  variations. There is akinesis and hyperechogenicity of the entire inferior  septum and inferior wall (consistent with scar in the distribution of the  right coronary artery). There is  severe hypokinesis of the apex and mid-apical anterior septum, consistent  with severe ischemia or infarction in the distribution of the mid-LAD  artery. No intracavitary thrombus is seen. Left ventricular ejection  fraction, by estimation, is 20 to 25%.  The left ventricle has severely decreased function. The left ventricular  internal cavity size was mildly dilated. Left ventricular diastolic  parameters are consistent with Grade I diastolic dysfunction (impaired  relaxation). There is incessant ectopy  that limits diastolic function evaluation.  2. Right ventricular systolic function is moderately reduced. The right  ventricular size is normal. There is normal pulmonary artery systolic  pressure.  3. Left atrial size was mildly dilated.  4. The pericardial effusion is circumferential. There is no evidence of  cardiac tamponade. Large pleural effusion in the left lateral region.  5. The mitral valve is normal in structure. Mild to moderate mitral valve  regurgitation.  6. The aortic valve is tricuspid. Aortic valve regurgitation is not  visualized. Mild aortic valve sclerosis is present, with no evidence of  aortic valve stenosis.  7. The inferior vena cava is dilated in size with >50% respiratory  variability, suggesting right atrial pressure of 8 mmHg. _______________  Right/Left Heart Catheterization 07/30/2019:  Prox RCA lesion is 100% stenosed.  Prox Cx to Mid Cx lesion is 100% stenosed.  1st Mrg-1 lesion is 80% stenosed.  1st Mrg-2 lesion is 90% stenosed.  Mid LAD lesion is 100% stenosed.  Ost LM to Mid LM lesion is 40% stenosed.  Ost LAD to Prox LAD lesion is 50% stenosed.  1st Diag lesion is 70% stenosed.  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 High grade disease in the Circumflex and OM  CT surgery consult for CABG  Patient Profile     64 y.o.malewith a history of recently diagnosed CHF at outside hospital, carotid artery stenosis s/p bilateral CEA, hypertension, hyperlipidemia, type 2 diabetes mellitus, COPD, tobacco abuse, CKD stage 3, and depression who is being seen for acute on chronic combined CHF at the request of Dr. Rush Landmark.  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  - Chest x-ray consistent with CHF with cardiomegaly, pulmonary edema, and small effusion.  - Echo showed LVEF of 20-25% with global hypokinesis with akinesis and hyperechogenicity of the entire inferior septum and inferior wall (consistent with scar in the distribution of the RCA) as well as severe hypokinesis of the apex and mid-apical anterior septum (consitent with severe  ischemia or infraction int he distribution of the mid LAD). - Given another dose of IV Lasix yesterday with documented urinary output of 1L in the past 24 hours. Net negative 7.1 L this admission. Weight down 13 lbs since admission. Renal function improved today. -Still has some crackles on exam but creatinine and BUN bumped with Lasix dose yesterday. Will defer additional Lasix to MD. - Continue Toprol-XL 25mg  daily. - Would consider adding ARB or Entresto after after surgery if BP and renal function allow. - Continue to monitor daily weight, strict I/O's, and renal function  Severe 3-Vessel CAD - Cath on 07/30/2019 showed severe 3 vessel CAD.  - Patient had new onset of nausea this morning. No chest pain.  - EKG today shows new Q waves in leads III and aVF. Also has ST elevation V1-V3 (more prominent than on prior tracings). T wave inversions in lateral leads. - Will discuss starting IV Heparin until surgery tomorrow with MD. - CT surgery consulted. Plan is for CABG tomorrow.  Frequent PVCs/Non-Sustained VT - Frequent PVCs (sometimes in bigeminy/trigeminy pattern) as well as 5 beats of NSVT noted on telemetry. - Potassium 4.5.  - Magnesium 2.0.  - Continue Toprol-XL. Can up-titrate if BP allows.  Hypertension  - BP soft at times but well controlled. - Continue Toprol as above.  Hyperlipidemia - Lipid panel this admission: Total cholesterol 203, Triglycerides 106, HDL 34, LDL 148.  - Not on statin at home. Simvastatin listed as allergy. - Started on Crestor 20mg  daily this admission. If intolerant, can consider referral to  lipid clinic.  Type 2 Diabetes Mellitus - Hemoglobin A1c 8.6 in 05/2019.  - On Farxiga and Glipizide-Metformin at home. - Inpatient management per primary team.  AKI on CKD Stage III - Creatinine 1.65 yesterday. Baseline reportedly around 1.1.  - Creatinine 1.38 today, up from 1.15 yesterday. - Continue to monitor.  Tobacco/Marijuana Abuse - Patient  smoking 2-2.5 packs per day prior to admission. Also reported marijuana use. - Will need to continue to discuss importance of complete cessation.  For questions or updates, please contact [place] Please consult [email or web address] for contact info under        Signed, Darreld Mclean, PA-C  08/01/2019, 10:11 AM

## 2019-08-01 NOTE — Progress Notes (Signed)
Family Medicine Teaching Service Daily Progress Note Intern Pager: [phone]  Patient name: Todd [name] Medical record number: [id number] Date of birth: Jun 10, 1955 Age: 64 y.o. Gender: male  Primary Care Provider: Galvin Proffer, MD Consultants: Cardiology Code Status: Full  Pt Overview and Major Events to Date:  6/17-patient admitted for CHF exacerbation 6/21-patient had heart cath  Assessment and Plan: Todd [name] is a 64 y.o. male  presenting with shortness of breath. PMH is significant for HFrEF, COPD, HTN, T2DM, HLD, MDD, chronic back pain, vitamin deficiency, BPH, tobacco use disorder, marijuana use disorder.  Acute respiratory failure  HFrEF exacerbation - improving Patient has diuresed 7.1 L since admission with 1 L over the last 24 hours.  Patient denies any shortness of breath this morning.  Patient with a few crackles in lung bases but improved since admission.  Almost euvolemic on exam with pitting edema in left lower extremity but not as significant in right lower extremity.  Echo this admission EF 20-25% with grade 1 diastolic dysfunction, severe hypokinesis at the apex as well as scarring in the RCA distribution.  Heart cath showed severe three-vessel disease with CAD. -Cardiology consulted, appreciate recommendations -We will clarify Lasix with PA -Toprol-XL 25 mg for rate control -Creatinine at baseline today so considering ARB or Entresto if renal function remains stable but not at this time -Strict I's and O's -Daily weights -Continuous pulse ox -PT/OT: Home health PT/OT -ASA - K goal >4 (giving 40kdurx2 6/20) , mag goal >2 (ordered 2g IV 6/20)   CAD with severe three-vessel disease Heart cath on 07/30/2019 showed severe three-vessel disease with CAD.  Cardiology contacted CT surgery who recommended CABG. This morning patient reported having nausea and abdominal pain since approximately 4 AM this morning.  He says "I feel really weird" and "am I going to die?".  Vital  signs were stable during evaluation including an oxygen saturation of 100% on room air, pulse rate in the 80s, blood pressure was mildly elevated from where he has been at 140s/80s.  Repeat EKG showing stable ST elevation but new Q waves in lead III and aVF. CT surgery has seen patient and are planning for CABG tomorrow 6/24. -Cardiology following, appreciate recommendations -CABG scheduled for tomorrow, delayed because of an emergent case -We will clarify Lasix dose with cardiology  AKI -improving Baseline 1.1.  On admission 1.4   had improved to baseline with diuresis but this morning increased to 1.38 from 1.15. -We will discuss Lasix dose with cardiology -Follow-up on morning BMPs -Continue to hold home lisinopril -Avoid nephrotoxic agents    COPD -stable Diagnosis is in patient's chart but patient reports he has never been told he has COPD.  He is on no medications for this. -Monitor respiratory status -Consider outpatient PFTs after discharge - encourage smoking cessation - Albuterol PRN  -Respiratory is following  HTN-stable Normotensive.  On medication includes lisinopril, Lasix 20 mg.   - diuresis per above - holding lisinopril in setting of AKI  - f/u cardiology recs regarding future change to Entresto    T2DM-stable A1c 9 this admission.  Patient received 5 units aspart.  Home medications include glyburide-Metformin 2.5-500 mg twice daily, Farxiga 5 mg daily, pioglitazone 45 mg daily.   -Hold home medications -Starting Lantus 5 units nightly while in the hospital -Monitor CBGs -Moderate SSI -Patient will need to be restarted on Farxiga at discharge -Consider Ozempic or Trulicity outpatient  HLD Home medications include Livalo 4 mg daily.  -trial Crestor 20  mg daily (consider lipid clinic referral if not tolerated) -ASA 81  MDD-stable Home medications include citalopram 40 mg once daily -Continue home medications -Decreased to 20 mg   Chronic back  pain-stable Home medications include oxycodone 15 mg 2 times daily, sometimes TID if pain is worse, gabapentin 300 mg 3 times daily. -Continue home medications as prescribed   BPH Stable.  No home medications -Monitor for urinary hesitance -Continue care outpatient - consider flomax if symptomatic  Tobacco use disorder Daily 2-2-1/2 pack/day smoker.  Patient and his wife are both planning to taper and quit soon. - encourage smoking cessation - nicotine patch while inpatient  Marijuana use disorder Daily marijuana smoker  FEN/GI: heart healthy carb modified diet, replete electrolytes as needed, n.p.o. at midnight PPx: Heparin in preparation for CABG, will be held at midnight  Disposition: Pending transfer to CVTS for CABG on 6/24  Subjective:  Patient concerns at this morning.  He says that he has had nausea for approximately 4 hours" something just feels wrong".  He keeps saying "I have been doing so good".  He reports that he is having nausea but has not vomited.  Denies any shortness of breath or chest pain.  Has much difficulty describing the feeling that he is having.  Also reports seeing spots.  EKG ordered  Objective: Temp:  [97.6 F (36.4 C)-98.2 F (36.8 C)] 97.7 F (36.5 C) (06/23 0410) Pulse Rate:  [76-91] 84 (06/23 0857) Resp:  [14-26] 19 (06/23 0857) BP: (97-121)/(62-82) 110/75 (06/23 0857) SpO2:  [93 %-100 %] 100 % (06/23 0857) Weight:  [73.8 kg] 73.8 kg (06/23 0500) Physical Exam: General: Laying in bed, reports nausea and concern Cardiovascular: Regular rate and rhythm, +1 pitting edema in left lower extremity and trace pitting edema in right lower extremity Respiratory: Normal work of breathing on room air, crackles in lung bases bilaterally Extremities: +1 pitting edema in left lower extremity and trace in right lower extremity, no tenderness to palpation, no erythema  Laboratory: Recent Labs  Lab 07/30/19 0303 07/30/19 1824 07/30/19 1831  07/31/19 0410 08/01/19 0227  WBC 7.0  --   --  7.3 7.1  HGB 13.5   < > 14.6 13.9 13.2  HCT 43.0   < > 43.0 44.6 42.8  PLT 184  --   --  186 181   < > = values in this interval not displayed.   Recent Labs  Lab 07/30/19 0303 07/30/19 1824 07/30/19 1831 07/31/19 0410 08/01/19 0844  NA 135   < > 138 134* 136  K 4.1   < > 3.6 4.4 4.5  CL 92*  --   --  95* 94*  CO2 32  --   --  30 32  BUN 28*  --   --  20 25*  CREATININE 1.67*  --   --  1.15 1.38*  CALCIUM 8.6*  --   --  8.6* 9.0  GLUCOSE 241*  --   --  178* 168*   < > = values in this interval not displayed.   Mag 1.2   Imaging/Diagnostic Tests: No results found.   Gifford Shave, MD 08/01/2019, 10:33 AM PGY-1, [place] Intern pager: [phone], text pages welcome

## 2019-08-01 NOTE — Progress Notes (Signed)
Initial Nutrition Assessment  DOCUMENTATION CODES:   Not applicable  INTERVENTION:    Glucerna Shake po TID, each supplement provides 220 kcal and 10 grams of protein  MVI daily   NUTRITION DIAGNOSIS:   Increased nutrient needs related to post-op healing as evidenced by estimated needs.  GOAL:   Patient will meet greater than or equal to 90% of their needs  MONITOR:   PO intake, Supplement acceptance, Weight trends, Labs, I & O's  REASON FOR ASSESSMENT:   Consult Diet education  ASSESSMENT:   Patient with PMH significant for CHF, COPD, HTN, DM, HLD, MDD, and BPH. Presents this admission with CHF exacerbation.   Plan CABG tomorrow.   Pt denies loss in appetite PTA. Typically consumes two meal daily that consist of eggs with coffee for breakfast and a meat, vegetable, grain for dinner. Drinks mostly sunny D or sweet tea mixed with unsweet tea (1/2 and 1/2). Endorses his A1c stays around 8 per his PCP. Is compliant with diabetes medication. Discussed beverage substitutions he could make for sugar sweetened beverages and went over how to read food labels.   Appetite this admission has been okay. Meal completions charted as 50-75% for his last five meals. Will provide supplement to maximize kcal and protein this admission.   Pt endorses a UBW 175 lb and denies recent wt loss. Records limited in weight history over the last year.   Medications: SS novolog, lantus Labs: CBG 109-209  Diet Order:   Diet Order            Diet NPO time specified  Diet effective midnight           Diet Heart Room service appropriate? Yes; Fluid consistency: Thin  Diet effective now                 EDUCATION NEEDS:   Education needs have been addressed  Skin:  Skin Assessment: Reviewed RN Assessment  Last BM:  6/20  Height:   Ht Readings from Last 1 Encounters:  08/01/19 5\' 9"  (1.753 m)    Weight:   Wt Readings from Last 1 Encounters:  08/01/19 73.8 kg    BMI:  Body  mass index is 24.04 kg/m.  Estimated Nutritional Needs:   Kcal:  1900-2100 kcal  Protein:  95-115 grams  Fluid:  >/= 1.9 L/day   08/03/19 RD, LDN Clinical Nutrition Pager listed in AMION

## 2019-08-01 NOTE — Progress Notes (Signed)
Pre op Cabg       has been completed. Preliminary results can be found under CV proc through chart review. Jeb Levering, BS, RDMS, RVT

## 2019-08-01 NOTE — Anesthesia Preprocedure Evaluation (Addendum)
Anesthesia Evaluation  Patient identified by MRN, date of birth, ID band Patient awake    Reviewed: Allergy & Precautions, NPO status , Patient's Chart, lab work & pertinent test results, reviewed documented beta blocker date and time   Airway Mallampati: II  TM Distance: >3 FB Neck ROM: Full    Dental  (+) Edentulous Upper, Missing, Poor Dentition   Pulmonary Current Smoker and Patient abstained from smoking.,    Pulmonary exam normal breath sounds clear to auscultation       Cardiovascular hypertension, Pt. on medications + CAD and +CHF  Normal cardiovascular exam Rhythm:Regular Rate:Normal  ECG: SR, rate 79  CATH: Prox RCA lesion is 100% stenosed. Prox Cx to Mid Cx lesion is 100% stenosed. 1st Mrg-1 lesion is 80% stenosed. 1st Mrg-2 lesion is 90% stenosed. Mid LAD lesion is 100% stenosed. Ost LM to Mid LM lesion is 40% stenosed. Ost LAD to Prox LAD lesion is 50% stenosed. 1st Diag lesion is 70% stenosed.   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 High grade disease in the Circumflex and OM  ECHO: 1. There is global left ventricular hypokinesis, with distinct regional variations. There is akinesis and hyperechogenicity of the entire inferior  septum and inferior wall (consistent with scar in the distribution of the right coronary artery). There is  severe hypokinesis of the apex and mid-apical anterior septum, consistent with severe ischemia or infarction in the distribution of the mid-LAD artery. No intracavitary thrombus is seen. Left ventricular ejection  fraction, by estimation, is 20 to 25%.  The left ventricle has severely decreased function. The left ventricular internal cavity size was mildly dilated. Left ventricular diastolic parameters are consistent with Grade I diastolic dysfunction (impaired relaxation). There is incessant ectopy that limits diastolic function evaluation.  2. Right  ventricular systolic function is moderately reduced. The right ventricular size is normal. There is normal pulmonary artery systolic pressure.  3. Left atrial size was mildly dilated.  4. The pericardial effusion is circumferential. There is no evidence of cardiac tamponade. Large pleural effusion in the left lateral region.  5. The mitral valve is normal in structure. Mild to moderate mitral valve regurgitation.  6. The aortic valve is tricuspid. Aortic valve regurgitation is not visualized. Mild aortic valve sclerosis is present, with no evidence of aortic valve stenosis.  7. The inferior vena cava is dilated in size with >50% respiratory variability, suggesting right atrial pressure of 8 mmHg.    Neuro/Psych PSYCHIATRIC DISORDERS Depression negative neurological ROS     GI/Hepatic negative GI ROS, (+)     substance abuse  ,   Endo/Other  diabetes, Oral Hypoglycemic Agents  Renal/GU negative Renal ROS     Musculoskeletal  (+) Arthritis , narcotic dependentChronic pain syndrome   Abdominal   Peds  Hematology HLD   Anesthesia Other Findings CAD  Reproductive/Obstetrics                           Anesthesia Physical Anesthesia Plan  ASA: IV  Anesthesia Plan: General   Post-op Pain Management:    Induction: Intravenous  PONV Risk Score and Plan: 1 and Ondansetron, Midazolam, Dexamethasone and Treatment may vary due to age or medical condition  Airway Management Planned: Oral ETT  Additional Equipment: Arterial line, CVP, PA Cath, TEE and Ultrasound Guidance Line Placement  Intra-op Plan:   Post-operative Plan: Post-operative intubation/ventilation  Informed Consent: I have reviewed the patients  History and Physical, chart, labs and discussed the procedure including the risks, benefits and alternatives for the proposed anesthesia with the patient or authorized representative who has indicated his/her understanding and acceptance.      Dental advisory given  Plan Discussed with: CRNA  Anesthesia Plan Comments:         Anesthesia Quick Evaluation

## 2019-08-01 NOTE — Progress Notes (Signed)
OT Cancellation Note  Patient Details Name: Todd [name] MRN: [id number] DOB: 04-08-1955   Cancelled Treatment:    Reason Eval/Treat Not Completed: Patient at procedure or test/ unavailable (Pt scheduled for CABG this date. Will follow up as appropriate post op.)  Dalphine Handing, MSOT, OTR/L Acute Rehabilitation Services [facility] [place] Office Number: [phone] Pager: [phone]  Dalphine Handing 08/01/2019, 1:02 PM

## 2019-08-01 NOTE — Progress Notes (Signed)
2 Days Post-Op Procedure(s) (LRB): RIGHT/LEFT HEART CATH AND CORONARY ANGIOGRAPHY (N/A) Subjective: Some nausea and "just didn't feel right" briefly this morning. No complaints at present, denies any CP or SOB  Objective: Vital signs in last 24 hours: Temp:  [97.7 F (36.5 C)-98.2 F (36.8 C)] 98 F (36.7 C) (06/23 1108) Pulse Rate:  [76-85] 82 (06/23 1108) Cardiac Rhythm: Normal sinus rhythm;Bundle branch block (06/23 0705) Resp:  [14-26] 18 (06/23 1108) BP: (97-112)/(62-82) 112/72 (06/23 1108) SpO2:  [98 %-100 %] 100 % (06/23 1108) Weight:  [73.8 kg] 73.8 kg (06/23 0500)  Hemodynamic parameters for last 24 hours:    Intake/Output from previous day: 06/22 0701 - 06/23 0700 In: 440 [P.O.:440] Out: 1050 [Urine:1050] Intake/Output this shift: No intake/output data recorded.  General appearance: alert, cooperative and no distress Neurologic: intact Heart: regular rate and rhythm Lungs: diminished breath sounds bibasilar  Lab Results: Recent Labs    07/31/19 0410 08/01/19 0227  WBC 7.3 7.1  HGB 13.9 13.2  HCT 44.6 42.8  PLT 186 181   BMET:  Recent Labs    07/31/19 0410 08/01/19 0844  NA 134* 136  K 4.4 4.5  CL 95* 94*  CO2 30 32  GLUCOSE 178* 168*  BUN 20 25*  CREATININE 1.15 1.38*  CALCIUM 8.6* 9.0    PT/INR:  Recent Labs    08/01/19 0844  LABPROT 12.7  INR 1.0   ABG    Component Value Date/Time   PHART 7.427 07/30/2019 1824   HCO3 33.4 (H) 07/30/2019 1831   TCO2 35 (H) 07/30/2019 1831   O2SAT 67.0 07/30/2019 1831   CBG (last 3)  Recent Labs    08/01/19 0629 08/01/19 1110 08/01/19 1813  GLUCAP 209* 109* 313*    Assessment/Plan: S/P Procedure(s) (LRB): RIGHT/LEFT HEART CATH AND CORONARY ANGIOGRAPHY (N/A) - 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

## 2019-08-02 ENCOUNTER — Inpatient Hospital Stay (HOSPITAL_COMMUNITY): Payer: Medicare Other

## 2019-08-02 ENCOUNTER — Inpatient Hospital Stay (HOSPITAL_COMMUNITY): Payer: Medicare Other | Admitting: Certified Registered Nurse Anesthetist

## 2019-08-02 ENCOUNTER — Inpatient Hospital Stay (HOSPITAL_COMMUNITY)
Admission: RE | Disposition: A | Discharge status: Nursing Home (Includes ICF) | Payer: Self-pay | Source: Home / Self Care | Attending: Thoracic Surgery (Cardiothoracic Vascular Surgery)

## 2019-08-02 DIAGNOSIS — I2510 Atherosclerotic heart disease of native coronary artery without angina pectoris: Secondary | ICD-10-CM | POA: Diagnosis present

## 2019-08-02 DIAGNOSIS — I25110 Atherosclerotic heart disease of native coronary artery with unstable angina pectoris: Secondary | ICD-10-CM

## 2019-08-02 HISTORY — PX: RADIAL ARTERY HARVEST: SHX5067

## 2019-08-02 HISTORY — PX: TEE WITHOUT CARDIOVERSION: SHX5443

## 2019-08-02 HISTORY — PX: CORONARY ARTERY BYPASS GRAFT: SHX141

## 2019-08-02 LAB — POCT I-STAT, CHEM 8
BUN: 31 mg/dL — ABNORMAL HIGH (ref 8–23)
BUN: 29 mg/dL — ABNORMAL HIGH (ref 8–23)
BUN: 28 mg/dL — ABNORMAL HIGH (ref 8–23)
BUN: 27 mg/dL — ABNORMAL HIGH (ref 8–23)
BUN: 25 mg/dL — ABNORMAL HIGH (ref 8–23)
BUN: 26 mg/dL — ABNORMAL HIGH (ref 8–23)
BUN: 23 mg/dL (ref 8–23)
Calcium, Ion: 1.27 mmol/L (ref 1.15–1.40)
Calcium, Ion: 1.21 mmol/L (ref 1.15–1.40)
Calcium, Ion: 1.07 mmol/L — ABNORMAL LOW (ref 1.15–1.40)
Calcium, Ion: 1.02 mmol/L — ABNORMAL LOW (ref 1.15–1.40)
Calcium, Ion: 0.99 mmol/L — ABNORMAL LOW (ref 1.15–1.40)
Calcium, Ion: 0.99 mmol/L — ABNORMAL LOW (ref 1.15–1.40)
Calcium, Ion: 1 mmol/L — ABNORMAL LOW (ref 1.15–1.40)
Chloride: 95 mmol/L — ABNORMAL LOW (ref 98–111)
Chloride: 97 mmol/L — ABNORMAL LOW (ref 98–111)
Chloride: 95 mmol/L — ABNORMAL LOW (ref 98–111)
Chloride: 97 mmol/L — ABNORMAL LOW (ref 98–111)
Chloride: 97 mmol/L — ABNORMAL LOW (ref 98–111)
Chloride: 98 mmol/L (ref 98–111)
Chloride: 98 mmol/L (ref 98–111)
Creatinine, Ser: 1.3 mg/dL — ABNORMAL HIGH (ref 0.61–1.24)
Creatinine, Ser: 1.1 mg/dL (ref 0.61–1.24)
Creatinine, Ser: 1.1 mg/dL (ref 0.61–1.24)
Creatinine, Ser: 1.1 mg/dL (ref 0.61–1.24)
Creatinine, Ser: 0.9 mg/dL (ref 0.61–1.24)
Creatinine, Ser: 1 mg/dL (ref 0.61–1.24)
Creatinine, Ser: 0.9 mg/dL (ref 0.61–1.24)
Glucose, Bld: 141 mg/dL — ABNORMAL HIGH (ref 70–99)
Glucose, Bld: 103 mg/dL — ABNORMAL HIGH (ref 70–99)
Glucose, Bld: 72 mg/dL (ref 70–99)
Glucose, Bld: 70 mg/dL (ref 70–99)
Glucose, Bld: 79 mg/dL (ref 70–99)
Glucose, Bld: 84 mg/dL (ref 70–99)
Glucose, Bld: 76 mg/dL (ref 70–99)
HCT: 42 % (ref 39.0–52.0)
HCT: 40 % (ref 39.0–52.0)
HCT: 30 % — ABNORMAL LOW (ref 39.0–52.0)
HCT: 29 % — ABNORMAL LOW (ref 39.0–52.0)
HCT: 26 % — ABNORMAL LOW (ref 39.0–52.0)
HCT: 26 % — ABNORMAL LOW (ref 39.0–52.0)
HCT: 26 % — ABNORMAL LOW (ref 39.0–52.0)
Hemoglobin: 14.3 g/dL (ref 13.0–17.0)
Hemoglobin: 13.6 g/dL (ref 13.0–17.0)
Hemoglobin: 10.2 g/dL — ABNORMAL LOW (ref 13.0–17.0)
Hemoglobin: 9.9 g/dL — ABNORMAL LOW (ref 13.0–17.0)
Hemoglobin: 8.8 g/dL — ABNORMAL LOW (ref 13.0–17.0)
Hemoglobin: 8.8 g/dL — ABNORMAL LOW (ref 13.0–17.0)
Hemoglobin: 8.8 g/dL — ABNORMAL LOW (ref 13.0–17.0)
Potassium: 4.2 mmol/L (ref 3.5–5.1)
Potassium: 3.9 mmol/L (ref 3.5–5.1)
Potassium: 4.9 mmol/L (ref 3.5–5.1)
Potassium: 4.4 mmol/L (ref 3.5–5.1)
Potassium: 4.2 mmol/L (ref 3.5–5.1)
Potassium: 4.3 mmol/L (ref 3.5–5.1)
Potassium: 3.8 mmol/L (ref 3.5–5.1)
Sodium: 139 mmol/L (ref 135–145)
Sodium: 137 mmol/L (ref 135–145)
Sodium: 136 mmol/L (ref 135–145)
Sodium: 137 mmol/L (ref 135–145)
Sodium: 138 mmol/L (ref 135–145)
Sodium: 139 mmol/L (ref 135–145)
Sodium: 141 mmol/L (ref 135–145)
TCO2: 32 mmol/L (ref 22–32)
TCO2: 29 mmol/L (ref 22–32)
TCO2: 34 mmol/L — ABNORMAL HIGH (ref 22–32)
TCO2: 32 mmol/L (ref 22–32)
TCO2: 31 mmol/L (ref 22–32)
TCO2: 32 mmol/L (ref 22–32)
TCO2: 33 mmol/L — ABNORMAL HIGH (ref 22–32)

## 2019-08-02 LAB — POCT I-STAT 7, (LYTES, BLD GAS, ICA,H+H)
Acid-Base Excess: 5 mmol/L — ABNORMAL HIGH (ref 0.0–2.0)
Acid-Base Excess: 7 mmol/L — ABNORMAL HIGH (ref 0.0–2.0)
Acid-Base Excess: 9 mmol/L — ABNORMAL HIGH (ref 0.0–2.0)
Acid-Base Excess: 9 mmol/L — ABNORMAL HIGH (ref 0.0–2.0)
Acid-Base Excess: 5 mmol/L — ABNORMAL HIGH (ref 0.0–2.0)
Acid-Base Excess: 5 mmol/L — ABNORMAL HIGH (ref 0.0–2.0)
Acid-Base Excess: 1 mmol/L (ref 0.0–2.0)
Bicarbonate: 32.9 mmol/L — ABNORMAL HIGH (ref 20.0–28.0)
Bicarbonate: 32.2 mmol/L — ABNORMAL HIGH (ref 20.0–28.0)
Bicarbonate: 33.4 mmol/L — ABNORMAL HIGH (ref 20.0–28.0)
Bicarbonate: 32.7 mmol/L — ABNORMAL HIGH (ref 20.0–28.0)
Bicarbonate: 29.7 mmol/L — ABNORMAL HIGH (ref 20.0–28.0)
Bicarbonate: 29.8 mmol/L — ABNORMAL HIGH (ref 20.0–28.0)
Bicarbonate: 26.5 mmol/L (ref 20.0–28.0)
Calcium, Ion: 1.24 mmol/L (ref 1.15–1.40)
Calcium, Ion: 0.97 mmol/L — ABNORMAL LOW (ref 1.15–1.40)
Calcium, Ion: 1.06 mmol/L — ABNORMAL LOW (ref 1.15–1.40)
Calcium, Ion: 1 mmol/L — ABNORMAL LOW (ref 1.15–1.40)
Calcium, Ion: 0.98 mmol/L — ABNORMAL LOW (ref 1.15–1.40)
Calcium, Ion: 1.55 mmol/L (ref 1.15–1.40)
Calcium, Ion: 1.21 mmol/L (ref 1.15–1.40)
HCT: 45 % (ref 39.0–52.0)
HCT: 33 % — ABNORMAL LOW (ref 39.0–52.0)
HCT: 33 % — ABNORMAL LOW (ref 39.0–52.0)
HCT: 27 % — ABNORMAL LOW (ref 39.0–52.0)
HCT: 30 % — ABNORMAL LOW (ref 39.0–52.0)
HCT: 28 % — ABNORMAL LOW (ref 39.0–52.0)
HCT: 31 % — ABNORMAL LOW (ref 39.0–52.0)
Hemoglobin: 15.3 g/dL (ref 13.0–17.0)
Hemoglobin: 11.2 g/dL — ABNORMAL LOW (ref 13.0–17.0)
Hemoglobin: 11.2 g/dL — ABNORMAL LOW (ref 13.0–17.0)
Hemoglobin: 9.2 g/dL — ABNORMAL LOW (ref 13.0–17.0)
Hemoglobin: 10.2 g/dL — ABNORMAL LOW (ref 13.0–17.0)
Hemoglobin: 9.5 g/dL — ABNORMAL LOW (ref 13.0–17.0)
Hemoglobin: 10.5 g/dL — ABNORMAL LOW (ref 13.0–17.0)
O2 Saturation: 100 %
O2 Saturation: 100 %
O2 Saturation: 100 %
O2 Saturation: 100 %
O2 Saturation: 100 %
O2 Saturation: 100 %
O2 Saturation: 97 %
Patient temperature: 36.8
Potassium: 4.2 mmol/L (ref 3.5–5.1)
Potassium: 4 mmol/L (ref 3.5–5.1)
Potassium: 4.4 mmol/L (ref 3.5–5.1)
Potassium: 4.6 mmol/L (ref 3.5–5.1)
Potassium: 3.9 mmol/L (ref 3.5–5.1)
Potassium: 4.5 mmol/L (ref 3.5–5.1)
Potassium: 3.9 mmol/L (ref 3.5–5.1)
Sodium: 137 mmol/L (ref 135–145)
Sodium: 140 mmol/L (ref 135–145)
Sodium: 138 mmol/L (ref 135–145)
Sodium: 138 mmol/L (ref 135–145)
Sodium: 141 mmol/L (ref 135–145)
Sodium: 140 mmol/L (ref 135–145)
Sodium: 140 mmol/L (ref 135–145)
TCO2: 35 mmol/L — ABNORMAL HIGH (ref 22–32)
TCO2: 34 mmol/L — ABNORMAL HIGH (ref 22–32)
TCO2: 35 mmol/L — ABNORMAL HIGH (ref 22–32)
TCO2: 34 mmol/L — ABNORMAL HIGH (ref 22–32)
TCO2: 31 mmol/L (ref 22–32)
TCO2: 31 mmol/L (ref 22–32)
TCO2: 28 mmol/L (ref 22–32)
pCO2 arterial: 61.6 mmHg — ABNORMAL HIGH (ref 32.0–48.0)
pCO2 arterial: 48.4 mmHg — ABNORMAL HIGH (ref 32.0–48.0)
pCO2 arterial: 41.9 mmHg (ref 32.0–48.0)
pCO2 arterial: 38.6 mmHg (ref 32.0–48.0)
pCO2 arterial: 45.5 mmHg (ref 32.0–48.0)
pCO2 arterial: 46.1 mmHg (ref 32.0–48.0)
pCO2 arterial: 45.8 mmHg (ref 32.0–48.0)
pH, Arterial: 7.336 — ABNORMAL LOW (ref 7.350–7.450)
pH, Arterial: 7.43 (ref 7.350–7.450)
pH, Arterial: 7.509 — ABNORMAL HIGH (ref 7.350–7.450)
pH, Arterial: 7.536 — ABNORMAL HIGH (ref 7.350–7.450)
pH, Arterial: 7.422 (ref 7.350–7.450)
pH, Arterial: 7.418 (ref 7.350–7.450)
pH, Arterial: 7.37 (ref 7.350–7.450)
pO2, Arterial: 244 mmHg — ABNORMAL HIGH (ref 83.0–108.0)
pO2, Arterial: 364 mmHg — ABNORMAL HIGH (ref 83.0–108.0)
pO2, Arterial: 410 mmHg — ABNORMAL HIGH (ref 83.0–108.0)
pO2, Arterial: 321 mmHg — ABNORMAL HIGH (ref 83.0–108.0)
pO2, Arterial: 428 mmHg — ABNORMAL HIGH (ref 83.0–108.0)
pO2, Arterial: 310 mmHg — ABNORMAL HIGH (ref 83.0–108.0)
pO2, Arterial: 88 mmHg (ref 83.0–108.0)

## 2019-08-02 LAB — BASIC METABOLIC PANEL
Anion gap: 8 (ref 5–15)
BUN: 21 mg/dL (ref 8–23)
CO2: 23 mmol/L (ref 22–32)
Calcium: 7.9 mg/dL — ABNORMAL LOW (ref 8.9–10.3)
Chloride: 107 mmol/L (ref 98–111)
Creatinine, Ser: 1.09 mg/dL (ref 0.61–1.24)
GFR calc Af Amer: >60 mL/min (ref >60)
GFR calc non Af Amer: >60 mL/min (ref >60)
Glucose, Bld: 141 mg/dL — ABNORMAL HIGH (ref 70–99)
Potassium: 4.8 mmol/L (ref 3.5–5.1)
Sodium: 138 mmol/L (ref 135–145)

## 2019-08-02 LAB — SURGICAL PCR SCREEN
MRSA, PCR: NEGATIVE
Staphylococcus aureus: NEGATIVE

## 2019-08-02 LAB — CBC
HCT: 26.3 % — ABNORMAL LOW (ref 39.0–52.0)
HCT: 31 % — ABNORMAL LOW (ref 39.0–52.0)
HCT: 43.6 % (ref 39.0–52.0)
Hemoglobin: 13.7 g/dL (ref 13.0–17.0)
Hemoglobin: 8 g/dL — ABNORMAL LOW (ref 13.0–17.0)
Hemoglobin: 9.8 g/dL — ABNORMAL LOW (ref 13.0–17.0)
MCH: 26.7 pg (ref 26.0–34.0)
MCH: 27.3 pg (ref 26.0–34.0)
MCH: 27.5 pg (ref 26.0–34.0)
MCHC: 30.4 g/dL (ref 30.0–36.0)
MCHC: 31.4 g/dL (ref 30.0–36.0)
MCHC: 31.6 g/dL (ref 30.0–36.0)
MCV: 86.4 fL (ref 80.0–100.0)
MCV: 87.4 fL (ref 80.0–100.0)
MCV: 87.7 fL (ref 80.0–100.0)
Platelets: 121 10*3/uL — ABNORMAL LOW (ref 150–400)
Platelets: 128 10*3/uL — ABNORMAL LOW (ref 150–400)
Platelets: 198 10*3/uL (ref 150–400)
RBC: 3 MIL/uL — ABNORMAL LOW (ref 4.22–5.81)
RBC: 3.59 MIL/uL — ABNORMAL LOW (ref 4.22–5.81)
RBC: 4.99 MIL/uL (ref 4.22–5.81)
RDW: 14.6 % (ref 11.5–15.5)
RDW: 14.8 % (ref 11.5–15.5)
RDW: 14.8 % (ref 11.5–15.5)
WBC: 16.5 10*3/uL — ABNORMAL HIGH (ref 4.0–10.5)
WBC: 16.6 10*3/uL — ABNORMAL HIGH (ref 4.0–10.5)
WBC: 6.7 10*3/uL (ref 4.0–10.5)
nRBC: 0 % (ref 0.0–0.2)
nRBC: 0 % (ref 0.0–0.2)
nRBC: 0 % (ref 0.0–0.2)

## 2019-08-02 LAB — GLUCOSE, CAPILLARY
Glucose-Capillary: 101 mg/dL — ABNORMAL HIGH (ref 70–99)
Glucose-Capillary: 109 mg/dL — ABNORMAL HIGH (ref 70–99)
Glucose-Capillary: 120 mg/dL — ABNORMAL HIGH (ref 70–99)
Glucose-Capillary: 128 mg/dL — ABNORMAL HIGH (ref 70–99)
Glucose-Capillary: 129 mg/dL — ABNORMAL HIGH (ref 70–99)
Glucose-Capillary: 218 mg/dL — ABNORMAL HIGH (ref 70–99)
Glucose-Capillary: 84 mg/dL (ref 70–99)
Glucose-Capillary: 84 mg/dL (ref 70–99)
Glucose-Capillary: 94 mg/dL (ref 70–99)
Glucose-Capillary: 94 mg/dL (ref 70–99)

## 2019-08-02 LAB — PROTIME-INR
INR: 1.7 — ABNORMAL HIGH (ref 0.8–1.2)
Prothrombin Time: 19.1 seconds — ABNORMAL HIGH (ref 11.4–15.2)

## 2019-08-02 LAB — MAGNESIUM
Magnesium: 2 mg/dL (ref 1.7–2.4)
Magnesium: 2.6 mg/dL — ABNORMAL HIGH (ref 1.7–2.4)

## 2019-08-02 LAB — APTT: aPTT: 39 seconds — ABNORMAL HIGH (ref 24–36)

## 2019-08-02 LAB — HEMOGLOBIN AND HEMATOCRIT, BLOOD
HCT: 27.9 % — ABNORMAL LOW (ref 39.0–52.0)
Hemoglobin: 8.9 g/dL — ABNORMAL LOW (ref 13.0–17.0)

## 2019-08-02 LAB — PREPARE RBC (CROSSMATCH)

## 2019-08-02 LAB — ECHO INTRAOPERATIVE TEE
Height: 69 in
Weight: 2585.6 oz

## 2019-08-02 LAB — PLATELET COUNT: Platelets: 136 10*3/uL — ABNORMAL LOW (ref 150–400)

## 2019-08-02 SURGERY — CORONARY ARTERY BYPASS GRAFTING (CABG)
Anesthesia: General | Site: Chest

## 2019-08-02 MED ORDER — SODIUM CHLORIDE 0.9 % IV SOLN: INTRAVENOUS | Status: DC

## 2019-08-02 MED ORDER — HEPARIN SODIUM (PORCINE) 1000 UNIT/ML IJ SOLN
INTRAMUSCULAR | Status: DC | PRN
  Administered 2019-08-02: 26000 [IU] via INTRAVENOUS

## 2019-08-02 MED ORDER — ASPIRIN 81 MG PO CHEW
324.0000 mg | CHEWABLE_TABLET | Freq: Every day | ORAL | Status: DC
  Administered 2019-08-03 – 2019-08-12 (×6): 324 mg
  Filled 2019-08-02 (×8): qty 4

## 2019-08-02 MED ORDER — MILRINONE LACTATE IN DEXTROSE 20-5 MG/100ML-% IV SOLN
0.2500 ug/kg/min | INTRAVENOUS | Status: DC
  Administered 2019-08-02: 0.25 ug/kg/min via INTRAVENOUS
  Filled 2019-08-02: qty 100

## 2019-08-02 MED ORDER — SODIUM CHLORIDE (PF) 0.9 % IJ SOLN
OROMUCOSAL | Status: DC | PRN
  Administered 2019-08-02 (×3): 4 mL via TOPICAL

## 2019-08-02 MED ORDER — DEXTROSE 50 % IV SOLN
0.0000 mL | INTRAVENOUS | Status: DC | PRN
  Administered 2019-08-23 – 2019-10-09 (×2): 50 mL via INTRAVENOUS
  Filled 2019-08-02 (×7): qty 50

## 2019-08-02 MED ORDER — SODIUM CHLORIDE 0.9% FLUSH
3.0000 mL | INTRAVENOUS | Status: DC | PRN
  Administered 2019-08-03: 3 mL via INTRAVENOUS

## 2019-08-02 MED ORDER — HEPARIN SODIUM (PORCINE) 1000 UNIT/ML IJ SOLN
INTRAMUSCULAR | Status: AC
  Filled 2019-08-02: qty 1

## 2019-08-02 MED ORDER — MIDAZOLAM HCL 2 MG/2ML IJ SOLN
2.0000 mg | INTRAMUSCULAR | Status: DC | PRN
  Administered 2019-08-08 – 2019-08-13 (×2): 2 mg via INTRAVENOUS
  Filled 2019-08-02 (×3): qty 2

## 2019-08-02 MED ORDER — CHLORHEXIDINE GLUCONATE CLOTH 2 % EX PADS
6.0000 | MEDICATED_PAD | Freq: Every day | CUTANEOUS | Status: DC
  Administered 2019-08-04 – 2019-08-31 (×28): 6 via TOPICAL

## 2019-08-02 MED ORDER — MIDAZOLAM HCL 5 MG/5ML IJ SOLN
INTRAMUSCULAR | Status: DC | PRN
  Administered 2019-08-02: 2 mg via INTRAVENOUS
  Administered 2019-08-02: 1 mg via INTRAVENOUS
  Administered 2019-08-02 (×2): 2 mg via INTRAVENOUS
  Administered 2019-08-02: 1 mg via INTRAVENOUS
  Administered 2019-08-02: 2 mg via INTRAVENOUS

## 2019-08-02 MED ORDER — FENTANYL CITRATE (PF) 250 MCG/5ML IJ SOLN
INTRAMUSCULAR | Status: DC | PRN
  Administered 2019-08-02: 100 ug via INTRAVENOUS
  Administered 2019-08-02: 150 ug via INTRAVENOUS
  Administered 2019-08-02: 100 ug via INTRAVENOUS
  Administered 2019-08-02: 50 ug via INTRAVENOUS
  Administered 2019-08-02: 150 ug via INTRAVENOUS
  Administered 2019-08-02 (×3): 100 ug via INTRAVENOUS

## 2019-08-02 MED ORDER — ACETAMINOPHEN 160 MG/5ML PO SOLN
1000.0000 mg | Freq: Four times a day (QID) | ORAL | Status: AC
  Administered 2019-08-02 – 2019-08-07 (×15): 1000 mg
  Filled 2019-08-02 (×15): qty 40.6

## 2019-08-02 MED ORDER — ROCURONIUM BROMIDE 10 MG/ML (PF) SYRINGE
PREFILLED_SYRINGE | INTRAVENOUS | Status: DC | PRN
  Administered 2019-08-02: 50 mg via INTRAVENOUS
  Administered 2019-08-02: 30 mg via INTRAVENOUS
  Administered 2019-08-02: 50 mg via INTRAVENOUS
  Administered 2019-08-02: 100 mg via INTRAVENOUS

## 2019-08-02 MED ORDER — VASOPRESSIN 20 UNIT/ML IV SOLN
0.0300 [IU]/min | INTRAVENOUS | Status: AC
  Administered 2019-08-03: .03 [IU]/min via INTRAVENOUS
  Filled 2019-08-02 (×3): qty 2

## 2019-08-02 MED ORDER — LACTATED RINGERS IV SOLN
INTRAVENOUS | Status: DC | PRN
Start: 2019-08-02 — End: 2019-08-02

## 2019-08-02 MED ORDER — ASPIRIN EC 325 MG PO TBEC: 325.0000 mg | DELAYED_RELEASE_TABLET | Freq: Every day | ORAL | Status: DC

## 2019-08-02 MED ORDER — ACETAMINOPHEN 650 MG RE SUPP
650.0000 mg | Freq: Once | RECTAL | Status: AC
  Administered 2019-08-02: 650 mg via RECTAL

## 2019-08-02 MED ORDER — BISACODYL 10 MG RE SUPP
10.0000 mg | Freq: Every day | RECTAL | Status: DC
  Filled 2019-08-02: qty 1

## 2019-08-02 MED ORDER — CHLORHEXIDINE GLUCONATE CLOTH 2 % EX PADS
6.0000 | MEDICATED_PAD | Freq: Every day | CUTANEOUS | Status: DC
  Administered 2019-08-02: 6 via TOPICAL

## 2019-08-02 MED ORDER — FAMOTIDINE IN NACL 20-0.9 MG/50ML-% IV SOLN
20.0000 mg | Freq: Two times a day (BID) | INTRAVENOUS | Status: AC
  Administered 2019-08-02 (×2): 20 mg via INTRAVENOUS
  Filled 2019-08-02: qty 50

## 2019-08-02 MED ORDER — METOPROLOL TARTRATE 25 MG/10 ML ORAL SUSPENSION
12.5000 mg | Freq: Two times a day (BID) | ORAL | Status: DC
  Filled 2019-08-02: qty 5

## 2019-08-02 MED ORDER — INSULIN REGULAR(HUMAN) IN NACL 100-0.9 UT/100ML-% IV SOLN: INTRAVENOUS | Status: DC

## 2019-08-02 MED ORDER — MIDAZOLAM HCL 2 MG/2ML IJ SOLN
INTRAMUSCULAR | Status: AC
  Filled 2019-08-02: qty 2

## 2019-08-02 MED ORDER — ROCURONIUM BROMIDE 10 MG/ML (PF) SYRINGE
PREFILLED_SYRINGE | INTRAVENOUS | Status: AC
  Filled 2019-08-02: qty 10

## 2019-08-02 MED ORDER — METOPROLOL TARTRATE 12.5 MG HALF TABLET: 12.5000 mg | ORAL_TABLET | Freq: Two times a day (BID) | ORAL | Status: DC

## 2019-08-02 MED ORDER — ALBUMIN HUMAN 5 % IV SOLN
250.0000 mL | INTRAVENOUS | Status: AC | PRN
  Administered 2019-08-02 (×2): 12.5 g via INTRAVENOUS

## 2019-08-02 MED ORDER — ARTIFICIAL TEARS OPHTHALMIC OINT
TOPICAL_OINTMENT | OPHTHALMIC | Status: DC | PRN
  Administered 2019-08-02: 1 via OPHTHALMIC

## 2019-08-02 MED ORDER — VASOPRESSIN 20 UNIT/ML IV SOLN
INTRAVENOUS | Status: AC
  Filled 2019-08-02: qty 1

## 2019-08-02 MED ORDER — DEXMEDETOMIDINE HCL IN NACL 400 MCG/100ML IV SOLN
0.0000 ug/kg/h | INTRAVENOUS | Status: DC
  Administered 2019-08-02: 0.7 ug/kg/h via INTRAVENOUS
  Administered 2019-08-03: 0.4 ug/kg/h via INTRAVENOUS
  Administered 2019-08-04: 0.7 ug/kg/h via INTRAVENOUS
  Administered 2019-08-04: 0.6 ug/kg/h via INTRAVENOUS
  Administered 2019-08-04 – 2019-08-08 (×10): 0.7 ug/kg/h via INTRAVENOUS
  Filled 2019-08-02 (×14): qty 100

## 2019-08-02 MED ORDER — SODIUM CHLORIDE 0.9% FLUSH
3.0000 mL | Freq: Two times a day (BID) | INTRAVENOUS | Status: DC
  Administered 2019-08-04 – 2019-10-08 (×73): 3 mL via INTRAVENOUS

## 2019-08-02 MED ORDER — PROTAMINE SULFATE 10 MG/ML IV SOLN
INTRAVENOUS | Status: DC | PRN
  Administered 2019-08-02: 260 mg via INTRAVENOUS

## 2019-08-02 MED ORDER — SODIUM CHLORIDE 0.45 % IV SOLN: INTRAVENOUS | Status: DC | PRN

## 2019-08-02 MED ORDER — NITROGLYCERIN IN D5W 200-5 MCG/ML-% IV SOLN
0.0000 ug/min | INTRAVENOUS | Status: DC
  Administered 2019-08-02: 5 ug/min via INTRAVENOUS

## 2019-08-02 MED ORDER — TRAMADOL HCL 50 MG PO TABS
50.0000 mg | ORAL_TABLET | ORAL | Status: DC | PRN
  Administered 2019-08-03: 50 mg via ORAL
  Filled 2019-08-02: qty 1

## 2019-08-02 MED ORDER — MAGNESIUM SULFATE 4 GM/100ML IV SOLN
4.0000 g | Freq: Once | INTRAVENOUS | Status: AC
  Administered 2019-08-02: 4 g via INTRAVENOUS

## 2019-08-02 MED ORDER — MIDAZOLAM HCL (PF) 10 MG/2ML IJ SOLN
INTRAMUSCULAR | Status: AC
  Filled 2019-08-02: qty 2

## 2019-08-02 MED ORDER — ACETAMINOPHEN 500 MG PO TABS
1000.0000 mg | ORAL_TABLET | Freq: Four times a day (QID) | ORAL | Status: AC
  Administered 2019-08-03: 1000 mg via ORAL
  Filled 2019-08-02: qty 2

## 2019-08-02 MED ORDER — LACTATED RINGERS IV SOLN: INTRAVENOUS | Status: DC | PRN

## 2019-08-02 MED ORDER — PANTOPRAZOLE SODIUM 40 MG PO TBEC: 40.0000 mg | DELAYED_RELEASE_TABLET | Freq: Every day | ORAL | Status: DC

## 2019-08-02 MED ORDER — ALBUMIN HUMAN 5 % IV SOLN
INTRAVENOUS | Status: DC | PRN
Start: 2019-08-02 — End: 2019-08-02

## 2019-08-02 MED ORDER — CALCIUM CHLORIDE 10 % IV SOLN
INTRAVENOUS | Status: DC | PRN
  Administered 2019-08-02: 200 mg via INTRAVENOUS

## 2019-08-02 MED ORDER — VASOPRESSIN 20 UNIT/ML IV SOLN
INTRAVENOUS | Status: DC | PRN
  Administered 2019-08-02: 5 [IU] via INTRAVENOUS
  Administered 2019-08-02: 2 [IU] via INTRAVENOUS
  Administered 2019-08-02: 1 [IU] via INTRAVENOUS
  Administered 2019-08-02: 5 [IU] via INTRAVENOUS
  Administered 2019-08-02: 2 [IU] via INTRAVENOUS
  Administered 2019-08-02: 3 [IU] via INTRAVENOUS
  Administered 2019-08-02: 2 [IU] via INTRAVENOUS

## 2019-08-02 MED ORDER — ALBUMIN HUMAN 5 % IV SOLN
250.0000 mL | INTRAVENOUS | Status: AC | PRN
  Administered 2019-08-02 (×4): 12.5 g via INTRAVENOUS
  Filled 2019-08-02 (×2): qty 250

## 2019-08-02 MED ORDER — FENTANYL CITRATE (PF) 250 MCG/5ML IJ SOLN
INTRAMUSCULAR | Status: AC
  Filled 2019-08-02: qty 25

## 2019-08-02 MED ORDER — SODIUM CHLORIDE 0.9% IV SOLUTION: Freq: Once | INTRAVENOUS | Status: AC

## 2019-08-02 MED ORDER — SODIUM CHLORIDE 0.9% FLUSH
10.0000 mL | Freq: Two times a day (BID) | INTRAVENOUS | Status: DC
  Administered 2019-08-02 – 2019-08-05 (×4): 10 mL
  Administered 2019-08-06: 40 mL
  Administered 2019-08-06 – 2019-08-24 (×16): 10 mL
  Administered 2019-08-26: 30 mL

## 2019-08-02 MED ORDER — PHENYLEPHRINE HCL-NACL 20-0.9 MG/250ML-% IV SOLN
0.0000 ug/min | INTRAVENOUS | Status: DC
  Administered 2019-08-03: 15 ug/min via INTRAVENOUS
  Administered 2019-08-03: 0 ug/min via INTRAVENOUS
  Filled 2019-08-02: qty 1000
  Filled 2019-08-02: qty 250

## 2019-08-02 MED ORDER — ONDANSETRON HCL 4 MG/2ML IJ SOLN
4.0000 mg | Freq: Four times a day (QID) | INTRAMUSCULAR | Status: DC | PRN
  Administered 2019-08-02 – 2019-10-01 (×14): 4 mg via INTRAVENOUS
  Filled 2019-08-02 (×14): qty 2

## 2019-08-02 MED ORDER — SODIUM CHLORIDE 0.9% FLUSH
10.0000 mL | INTRAVENOUS | Status: DC | PRN
  Administered 2019-08-05: 10 mL

## 2019-08-02 MED ORDER — CHLORHEXIDINE GLUCONATE 0.12 % MT SOLN
15.0000 mL | OROMUCOSAL | Status: AC
  Administered 2019-08-02: 15 mL via OROMUCOSAL

## 2019-08-02 MED ORDER — MILRINONE LACTATE IN DEXTROSE 20-5 MG/100ML-% IV SOLN: 0.3750 ug/kg/min | INTRAVENOUS | Status: DC

## 2019-08-02 MED ORDER — BISACODYL 5 MG PO TBEC
10.0000 mg | DELAYED_RELEASE_TABLET | Freq: Every day | ORAL | Status: DC
  Administered 2019-08-04: 10 mg via ORAL
  Filled 2019-08-02: qty 2

## 2019-08-02 MED ORDER — LACTATED RINGERS IV SOLN: INTRAVENOUS | Status: DC

## 2019-08-02 MED ORDER — MORPHINE SULFATE (PF) 2 MG/ML IV SOLN
1.0000 mg | INTRAVENOUS | Status: DC | PRN
  Administered 2019-08-03 (×3): 2 mg via INTRAVENOUS
  Filled 2019-08-02 (×5): qty 1

## 2019-08-02 MED ORDER — HEMOSTATIC AGENTS (NO CHARGE) OPTIME
TOPICAL | Status: DC | PRN
  Administered 2019-08-02: 1 via TOPICAL

## 2019-08-02 MED ORDER — POTASSIUM CHLORIDE 10 MEQ/50ML IV SOLN
10.0000 meq | INTRAVENOUS | Status: AC
  Administered 2019-08-02 (×3): 10 meq via INTRAVENOUS

## 2019-08-02 MED ORDER — SODIUM CHLORIDE 0.9 % IV SOLN
1.5000 g | Freq: Two times a day (BID) | INTRAVENOUS | Status: AC
  Administered 2019-08-02 – 2019-08-04 (×4): 1.5 g via INTRAVENOUS
  Filled 2019-08-02 (×4): qty 1.5

## 2019-08-02 MED ORDER — MILRINONE LACTATE IN DEXTROSE 20-5 MG/100ML-% IV SOLN
INTRAVENOUS | Status: DC | PRN
Start: 2019-08-02 — End: 2019-08-02
  Administered 2019-08-02: .375 ug/kg/min via INTRAVENOUS

## 2019-08-02 MED ORDER — SODIUM CHLORIDE 0.9 % IV SOLN
250.0000 mL | INTRAVENOUS | Status: DC
  Administered 2019-08-03: 250 mL via INTRAVENOUS

## 2019-08-02 MED ORDER — NOREPINEPHRINE 4 MG/250ML-% IV SOLN
0.0000 ug/min | INTRAVENOUS | Status: DC
  Administered 2019-08-02: 39 ug/min via INTRAVENOUS
  Administered 2019-08-02 (×2): 40 ug/min via INTRAVENOUS
  Administered 2019-08-02: 25 ug/min via INTRAVENOUS
  Administered 2019-08-03: 33 ug/min via INTRAVENOUS
  Administered 2019-08-03: 30 ug/min via INTRAVENOUS
  Administered 2019-08-03: 31 ug/min via INTRAVENOUS
  Filled 2019-08-02: qty 500
  Filled 2019-08-02: qty 250
  Filled 2019-08-02: qty 500
  Filled 2019-08-02 (×3): qty 250

## 2019-08-02 MED ORDER — LACTATED RINGERS IV SOLN: 500.0000 mL | Freq: Once | INTRAVENOUS | Status: DC | PRN

## 2019-08-02 MED ORDER — 0.9 % SODIUM CHLORIDE (POUR BTL) OPTIME
TOPICAL | Status: DC | PRN
  Administered 2019-08-02: 6000 mL

## 2019-08-02 MED ORDER — METOPROLOL TARTRATE 5 MG/5ML IV SOLN: 2.5000 mg | INTRAVENOUS | Status: DC | PRN

## 2019-08-02 MED ORDER — PHENYLEPHRINE 40 MCG/ML (10ML) SYRINGE FOR IV PUSH (FOR BLOOD PRESSURE SUPPORT)
PREFILLED_SYRINGE | INTRAVENOUS | Status: DC | PRN
  Administered 2019-08-02 (×3): 120 ug via INTRAVENOUS

## 2019-08-02 MED ORDER — OXYCODONE HCL 5 MG PO TABS: 5.0000 mg | ORAL_TABLET | ORAL | Status: DC | PRN

## 2019-08-02 MED ORDER — ACETAMINOPHEN 160 MG/5ML PO SOLN: 650.0000 mg | Freq: Once | ORAL | Status: AC

## 2019-08-02 MED ORDER — VANCOMYCIN HCL IN DEXTROSE 1-5 GM/200ML-% IV SOLN
1000.0000 mg | Freq: Once | INTRAVENOUS | Status: AC
  Administered 2019-08-02: 1000 mg via INTRAVENOUS
  Filled 2019-08-02: qty 200

## 2019-08-02 MED ORDER — PROTAMINE SULFATE 10 MG/ML IV SOLN
INTRAVENOUS | Status: AC
  Filled 2019-08-02: qty 25

## 2019-08-02 MED ORDER — ETOMIDATE 2 MG/ML IV SOLN
INTRAVENOUS | Status: DC | PRN
  Administered 2019-08-02: 10 mg via INTRAVENOUS

## 2019-08-02 MED ORDER — DOCUSATE SODIUM 100 MG PO CAPS: 200.0000 mg | ORAL_CAPSULE | Freq: Every day | ORAL | Status: DC

## 2019-08-02 MED ORDER — PLASMA-LYTE 148 IV SOLN
INTRAVENOUS | Status: DC | PRN
  Administered 2019-08-02: 500 mL via INTRAVASCULAR

## 2019-08-02 MED ORDER — PROPOFOL 10 MG/ML IV BOLUS
INTRAVENOUS | Status: AC
  Filled 2019-08-02: qty 20

## 2019-08-02 SURGICAL SUPPLY — 106 items
BAG DECANTER FOR FLEXI CONT (MISCELLANEOUS) ×5 IMPLANT
BLADE CLIPPER SURG (BLADE) ×5 IMPLANT
BLADE STERNUM SYSTEM 6 (BLADE) ×5 IMPLANT
BLADE SURG 11 STRL SS (BLADE) ×2 IMPLANT
BLADE SURG 15 STRL LF DISP TIS (BLADE) IMPLANT
BLADE SURG 15 STRL SS (BLADE) ×2
BNDG ELASTIC 4X5.8 VLCR STR LF (GAUZE/BANDAGES/DRESSINGS) ×7 IMPLANT
BNDG ELASTIC 6X5.8 VLCR STR LF (GAUZE/BANDAGES/DRESSINGS) ×5 IMPLANT
BNDG GAUZE ELAST 4 BULKY (GAUZE/BANDAGES/DRESSINGS) ×5 IMPLANT
CANISTER SUCT 3000ML PPV (MISCELLANEOUS) ×5 IMPLANT
CANNULA EZ GLIDE AORTIC 21FR (CANNULA) ×5 IMPLANT
CATH CPB KIT HENDRICKSON (MISCELLANEOUS) ×5 IMPLANT
CATH ROBINSON RED A/P 18FR (CATHETERS) ×7 IMPLANT
CATH THORACIC 36FR (CATHETERS) ×5 IMPLANT
CATH THORACIC 36FR RT ANG (CATHETERS) ×5 IMPLANT
CLIP FOGARTY SPRING 6M (CLIP) ×4 IMPLANT
CLIP VESOCCLUDE MED 24/CT (CLIP) ×2 IMPLANT
CLIP VESOCCLUDE SM WIDE 24/CT (CLIP) ×4 IMPLANT
CLOSURE WOUND 1/2 X4 (GAUZE/BANDAGES/DRESSINGS) ×1
CONN ST 1/4X3/8  BEN (MISCELLANEOUS) ×4
CONN ST 1/4X3/8 BEN (MISCELLANEOUS) IMPLANT
COVER MAYO STAND STRL (DRAPES) ×2 IMPLANT
DERMABOND ADVANCED (GAUZE/BANDAGES/DRESSINGS) ×2
DERMABOND ADVANCED .7 DNX12 (GAUZE/BANDAGES/DRESSINGS) IMPLANT
DRAIN CHANNEL 28F RND 3/8 FF (WOUND CARE) ×4 IMPLANT
DRAPE CARDIOVASCULAR INCISE (DRAPES) ×2
DRAPE EXTREMITY T 121X128X90 (DISPOSABLE) ×2 IMPLANT
DRAPE HALF SHEET 40X57 (DRAPES) ×2 IMPLANT
DRAPE SLUSH/WARMER DISC (DRAPES) ×5 IMPLANT
DRAPE SRG 135X102X78XABS (DRAPES) ×3 IMPLANT
DRSG AQUACEL AG ADV 3.5X14 (GAUZE/BANDAGES/DRESSINGS) ×2 IMPLANT
DRSG COVADERM 4X14 (GAUZE/BANDAGES/DRESSINGS) ×5 IMPLANT
ELECT REM PT RETURN 9FT ADLT (ELECTROSURGICAL) ×10
ELECTRODE REM PT RTRN 9FT ADLT (ELECTROSURGICAL) ×6 IMPLANT
FELT TEFLON 1X6 (MISCELLANEOUS) ×8 IMPLANT
GAUZE SPONGE 4X4 12PLY STRL (GAUZE/BANDAGES/DRESSINGS) ×12 IMPLANT
GLOVE SURG SIGNA 7.5 PF LTX (GLOVE) ×15 IMPLANT
GOWN STRL REUS W/ TWL LRG LVL3 (GOWN DISPOSABLE) ×12 IMPLANT
GOWN STRL REUS W/ TWL XL LVL3 (GOWN DISPOSABLE) ×6 IMPLANT
GOWN STRL REUS W/TWL LRG LVL3 (GOWN DISPOSABLE) ×12
GOWN STRL REUS W/TWL XL LVL3 (GOWN DISPOSABLE) ×6
HAND PIECE HARMONIC (INSTRUMENTS) ×5
HANDPIECE HARMONIC (INSTRUMENTS) IMPLANT
HEMOSTAT POWDER SURGIFOAM 1G (HEMOSTASIS) ×15 IMPLANT
HEMOSTAT SURGICEL 2X14 (HEMOSTASIS) ×5 IMPLANT
INSERT FOGARTY XLG (MISCELLANEOUS) IMPLANT
KIT BASIN OR (CUSTOM PROCEDURE TRAY) ×5 IMPLANT
KIT SUCTION CATH 14FR (SUCTIONS) ×14 IMPLANT
KIT TURNOVER KIT B (KITS) ×5 IMPLANT
KIT VASOVIEW HEMOPRO 2 VH 4000 (KITS) ×5 IMPLANT
MARKER GRAFT CORONARY BYPASS (MISCELLANEOUS) ×15 IMPLANT
NS IRRIG 1000ML POUR BTL (IV SOLUTION) ×27 IMPLANT
PACK E OPEN HEART (SUTURE) ×5 IMPLANT
PACK OPEN HEART (CUSTOM PROCEDURE TRAY) ×5 IMPLANT
PAD ARMBOARD 7.5X6 YLW CONV (MISCELLANEOUS) ×10 IMPLANT
PAD ELECT DEFIB RADIOL ZOLL (MISCELLANEOUS) ×5 IMPLANT
PENCIL BUTTON HOLSTER BLD 10FT (ELECTRODE) ×5 IMPLANT
POSITIONER HEAD DONUT 9IN (MISCELLANEOUS) ×5 IMPLANT
PUNCH AORTIC ROTATE 4.0MM (MISCELLANEOUS) ×2 IMPLANT
PUNCH AORTIC ROTATE 4.5MM 8IN (MISCELLANEOUS) IMPLANT
PUNCH AORTIC ROTATE 5MM 8IN (MISCELLANEOUS) IMPLANT
SET CARDIOPLEGIA MPS 5001102 (MISCELLANEOUS) ×2 IMPLANT
SOL ANTI FOG 6CC (MISCELLANEOUS) IMPLANT
SOLUTION ANTI FOG 6CC (MISCELLANEOUS) ×2
SPONGE LAP 18X18 RF (DISPOSABLE) ×4 IMPLANT
SPONGE LAP 4X18 RFD (DISPOSABLE) ×2 IMPLANT
STRIP CLOSURE SKIN 1/2X4 (GAUZE/BANDAGES/DRESSINGS) ×1 IMPLANT
SUPPORT HEART JANKE-BARRON (MISCELLANEOUS) ×5 IMPLANT
SUT BONE WAX W31G (SUTURE) ×5 IMPLANT
SUT ETHIBOND 2 0 SH (SUTURE) ×2
SUT ETHIBOND 2 0 SH 36X2 (SUTURE) IMPLANT
SUT MNCRL AB 3-0 PS2 18 (SUTURE) ×4 IMPLANT
SUT MNCRL AB 4-0 PS2 18 (SUTURE) IMPLANT
SUT PROLENE 3 0 SH DA (SUTURE) ×5 IMPLANT
SUT PROLENE 4 0 RB 1 (SUTURE) ×2
SUT PROLENE 4 0 SH DA (SUTURE) IMPLANT
SUT PROLENE 4-0 RB1 .5 CRCL 36 (SUTURE) IMPLANT
SUT PROLENE 5 0 C 1 36 (SUTURE) ×4 IMPLANT
SUT PROLENE 6 0 C 1 30 (SUTURE) ×20 IMPLANT
SUT PROLENE 7 0 BV 1 (SUTURE) ×10 IMPLANT
SUT PROLENE 7 0 BV1 MDA (SUTURE) ×7 IMPLANT
SUT PROLENE 8 0 BV175 6 (SUTURE) ×20 IMPLANT
SUT SILK  1 MH (SUTURE) ×2
SUT SILK 1 MH (SUTURE) IMPLANT
SUT STEEL 6MS V (SUTURE) ×5 IMPLANT
SUT STEEL STERNAL CCS#1 18IN (SUTURE) IMPLANT
SUT STEEL SZ 6 DBL 3X14 BALL (SUTURE) ×5 IMPLANT
SUT VIC AB 1 CTX 36 (SUTURE) ×4
SUT VIC AB 1 CTX36XBRD ANBCTR (SUTURE) ×6 IMPLANT
SUT VIC AB 2-0 CT1 27 (SUTURE) ×4
SUT VIC AB 2-0 CT1 TAPERPNT 27 (SUTURE) IMPLANT
SUT VIC AB 2-0 CTX 27 (SUTURE) IMPLANT
SUT VIC AB 3-0 SH 27 (SUTURE)
SUT VIC AB 3-0 SH 27X BRD (SUTURE) IMPLANT
SUT VIC AB 3-0 X1 27 (SUTURE) IMPLANT
SUT VICRYL 4-0 PS2 18IN ABS (SUTURE) IMPLANT
SYSTEM SAHARA CHEST DRAIN ATS (WOUND CARE) ×5 IMPLANT
TAPE CLOTH SURG 4X10 WHT LF (GAUZE/BANDAGES/DRESSINGS) ×2 IMPLANT
TAPE PAPER 2X10 WHT MICROPORE (GAUZE/BANDAGES/DRESSINGS) ×2 IMPLANT
TOWEL GREEN STERILE (TOWEL DISPOSABLE) ×5 IMPLANT
TOWEL GREEN STERILE FF (TOWEL DISPOSABLE) ×5 IMPLANT
TRAY FOLEY SLVR 16FR TEMP STAT (SET/KITS/TRAYS/PACK) ×5 IMPLANT
TUBING LAP HI FLOW INSUFFLATIO (TUBING) ×5 IMPLANT
UNDERPAD 30X36 HEAVY ABSORB (UNDERPADS AND DIAPERS) ×5 IMPLANT
WATER STERILE IRR 1000ML POUR (IV SOLUTION) ×10 IMPLANT
YANKAUER SUCT BULB TIP NO VENT (SUCTIONS) ×4 IMPLANT

## 2019-08-02 NOTE — Progress Notes (Signed)
[facility] BRIEF SICU PROGRESS NOTE  Day of Surgery  S/P Procedure(s) (LRB): CORONARY ARTERY BYPASS GRAFTING (CABG), ON PUMP, TIMES FOUR, USING LEFT INTERNAL MAMMARY, LEFT RADIAL ARTERY, AND ENDOSCOPICALLY HARVESTED RIGHT GREATER SAPHENOUS VEIN (N/A) TRANSESOPHAGEAL ECHOCARDIOGRAM (TEE) (N/A) LEFT RADIAL ARTERY HARVEST (Left)   Sedated on vent AV paced w/ stable hemodynamics although vasodilated on milrinone and NTG drips, requiring levophed for BP support O2 sats 100% Chest tube output low UOP > 100 mL/hr Labs okay  Plan: Continue routine early postop.  Will decrease milrinone dose  Todd Nails, MD 08/02/2019 5:16 PM

## 2019-08-02 NOTE — Transfer of Care (Signed)
Immediate Anesthesia Transfer of Care Note  Patient: Todd [name]  Procedure(s) Performed: CORONARY ARTERY BYPASS GRAFTING (CABG), ON PUMP, TIMES FOUR, USING LEFT INTERNAL MAMMARY, LEFT RADIAL ARTERY, AND ENDOSCOPICALLY HARVESTED RIGHT GREATER SAPHENOUS VEIN (N/A Chest) TRANSESOPHAGEAL ECHOCARDIOGRAM (TEE) (N/A ) LEFT RADIAL ARTERY HARVEST (Left )  Patient Location: PACU and ICU  Anesthesia Type:General  Level of Consciousness: sedated and Patient remains intubated per anesthesia plan  Airway & Oxygen Therapy: Patient remains intubated per anesthesia plan and Patient placed on Ventilator (see vital sign flow sheet for setting)  Post-op Assessment: Report given to RN and Post -op Vital signs reviewed and stable  Post vital signs: Reviewed and stable  Last Vitals:  Vitals Value Taken Time  BP 99/67 08/02/19 1545  Temp 36.6 C 08/02/19 1552  Pulse 89 08/02/19 1552  Resp 12 08/02/19 1552  SpO2 99 % 08/02/19 1552  Vitals shown include unvalidated device data.  Last Pain:  Vitals:   08/02/19 0413  TempSrc: Oral  PainSc:       Patients Stated Pain Goal: 0 (07/31/19 2124)  Complications: No complications documented.

## 2019-08-02 NOTE — OR Nursing (Signed)
Forty-five minute call to 2 Heart Charge Nurse at 1356. Spoke to [place].

## 2019-08-02 NOTE — OR Nursing (Signed)
Twenty minute call to 2 Heart Charge Nurse at 1440. Spoke to [place]. Cath Lab also notified of timing. Spoke to [place].

## 2019-08-02 NOTE — Progress Notes (Signed)
RT assessed pts readiness to wean per heart wean protocol. Pt able to hold head off of pillow for over 20 secs, stick out tongue, wiggle toes, and squeeze RTs hand. Pt placed in first phase of wean on SIMV/PRVC/PSV  560/4/40%/+5/10 PS. RT will continue to monitor.

## 2019-08-02 NOTE — Progress Notes (Signed)
  Echocardiogram Echocardiogram Transesophageal has been performed.  Todd [name] M 08/02/2019, 8:47 AM 

## 2019-08-02 NOTE — Progress Notes (Signed)
Pt taken down to OR, bedside report given.

## 2019-08-02 NOTE — Interval H&P Note (Signed)
History and Physical Interval Note:  08/02/2019 7:43 AM  Todd [name]  has presented today for surgery, with the diagnosis of CAD.  The various methods of treatment have been discussed with the patient and family. After consideration of risks, benefits and other options for treatment, the patient has consented to  Procedure(s): CORONARY ARTERY BYPASS GRAFTING (CABG) (N/A) TRANSESOPHAGEAL ECHOCARDIOGRAM (TEE) (N/A) as a surgical intervention.  The patient's history has been reviewed, patient examined, no change in status, stable for surgery.  I have reviewed the patient's chart and labs.  Questions were answered to the patient's satisfaction.     [name]

## 2019-08-02 NOTE — Discharge Summary (Addendum)
Physician Discharge Summary       [address]       [place], [address]             [phone]    Patient ID: Todd [name] MRN: [id number] DOB/AGE: Dec 27, 1955 64 y.o.  Admit date: 07/26/2019 Discharge date: 10/09/2019  Admission Diagnoses: Ischemic cardiomyopathy Coronary artery disease involving native coronary artery of native heart with unstable angina pectoris Todd [name])  Discharge Diagnoses:  1. S/p CABG x 4 2. Post op V tach arrest 3. 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 History of CHF (congestive heart failure) (Todd [name]) 6. History of Mixed hyperlipidemia 7. History of Type 2 diabetes mellitus with hyperglycemia (Todd [name]) 8. History of Chronic pain syndrome 9. History of Moderate recurrent major depression (Todd [name]) 10. History of  Hypothyroidism 11. History of  AKI (acute kidney injury) (Todd [name])  12. Ventilator dependent respiratory failure 13. Pneumonia 14. Deconditioning 15. Tracheostomy  Consults: pulmonary/intensive care and infectious disease, radiology, heart failure  Procedure (s):  1. Median sternotomy, extracorporeal circulation, coronary artery bypass grafting x4 (left internal mammary artery to left anterior descending, saphenous vein graft to 1st diagonal, saphenous vein graft to posterior descending, left radial to obtuse marginal 1),  endoscopic vein harvest, right thigh by Dr. Roxan [name] on 08/02/2019.  2. Emergency bedside median sternotomy by Dr. Roxy [name] on 08/03/2019.  3. Institution of [place] ECMO by Dr. Orvan [name] 08/04/2019.  4. Reexploration for bleeding by Dr. Roxan [name] on 08/04/2019.  5. De cannulation and placement of Impella by Dr. Orvan [name] 08/06/2019.  6. Exploration of chest by Dr. Orvan [name] on 08/07/2019.  7. Sternal closure, TEE, application of Prevena wound vac by Dr. Orvan [name] on 08/10/2019.  History of Presenting Illness:  Todd [name] is a 64 year old gentleman with past medical history significant for type 2 diabetes mellitus, dyslipidemia,  spinal stenosis resulting in chronic pain syndrome, chronic renal insufficiency, and history of bilateral carotid artery stenoses requiring bilateral carotid endarterectomy 7 to 8 years ago.  He is also an active smoker and uses marijuana on a daily basis.  While he denies ever having any chest pain, he reports having progressive shortness of breath starting about 3 months ago.  This worsened to the point that he presented for evaluation to his primary care physician, Dr. Domenica [name].  An echocardiogram was performed which is reported to show reduced ejection fraction of 25 to 30% which was drastically reduced from an EF of around 60% on echo done about a year earlier.  Arrangements will be made for referral to Todd [name] for management of this newly diagnosed heart failure clinic because of worsening symptoms, Todd [name] presented to the [facility] emergency room on 617.  In the ED, he was noted to have significant peripheral edema and on chest x-ray had obvious pulmonary edema with cardiomegaly.  His BNP was elevated in excess of 2500.  Serial high-sensitivity troponin were 115x2.  His EKG showed sinus rhythm with anterior and anteroseptal Q waves.  He was admitted to the hospital for management of acute on chronic systolic heart failure.  He was diuresed aggressively which resulted in significant improvement in his breathing.  He did develop acute on chronic renal insufficiency in response to diuresis and was given some time to recover prior to having left heart catheterization last evening.  This study demonstrated severe three-vessel coronary artery disease with chronic total occlusions of both the LAD and right coronary arteries.  Additionally, there were high-grade lesions involving the first and second obtuse marginals.  We have been asked to evaluate Todd [name] for consideration of surgical coronary revascularization. Todd [name] is currently resting in bed and appears comfortable.  His wife is at bedside.  He  denies any pain or shortness of breath currently.  He is on room air and oxygen saturation is 100%.  Dr. Roxan [name] discussed the general nature of the procedure, including the need for general anesthesia, the incisions to be used, the use of cardiopulmonary bypass and the use of drainage tubes postoperatively with the [facility]. Dr. Roxan [name] discussed the expected hospital stay, overall recovery and short and long term outcomes. Dr. Roxan [name] informed Mr and Todd [name] of the indications, risks, benefits and alternatives. They understand and agree to proceed with surgery. Pre operative carotid duplex US showed no significant internal carotid artery stenosis bilaterally.  Brief Hospital Course:   Patient was initially AV paced. He was on Milrinone, Levophed, and Nitroglycerin drips. Respiratory began to wean him from the vent. He was coagulopathic and received multiple products. He was extubated the early morning of post operative day one. Patient had 300 cc of dark bloody output from his chest tube in one hour. He also had a long run of v tach. Patient then had a cardiac arrest. Emergency bedside median sternotomy was done by Dr. Roxy [name]. Patient was then taken to OR and was put on [place] ECMO.  Pulmonary/CCM was consulted to assist in vent management. Patient was decannulated on 08/06/2019 by Dr. Orvan [name] and an Impella was placed. Because of worsening hemodynamics on 06/29 requiring several doses of epinephrine and more pressors, bedside echo done. Results showed mediastinal hematoma and loculated pleural effusion compressing the RV with tamponade physiology. Patient returned to OR for emergent chest exploration. He was on multiple drips-Nor epinephrine, Levophed,Vasopressin, Amiodarone, Heparin, and Milrinone. He was extremely volume overloaded and started on a Lasix drip as well. He was on Meropenem and Vancomycin empirically as his sternum was no closed.Cortrak was placed and tube feedings were started and  patient remained sedated on vent support. Patient returned to OR on 08/10/2019 in order to undergo sternal closure and application of Prevena wound vac. He then had a fair amount of ventricular arrhythmias on 07/03. This was likely the result of hypokalemia (related to diuresis) and potassium was corrected. Patient then had a significant ileus with gastric residual of 1.2 L from postpyloric feeding tube volume.  Patient required  short-term TNA as nutritional status is worsening (albumin 1.2). [name] was added to antibiotic regimen on 07/07. Patient had thrombocytopenia post op. His platelet count went down to 83,000 and HIT was checked. Results showed 2.604.  This was felt to be positive and the patient was started on Argatroban. .Alk phos, ALT, AST, and direct billi all continue to be elevated (tibli went as high as 8) . RUQ sound on 7/7 showed cholelithiasis without acute cholecystitis. Nodular liver was also noted which likely represents chronic liver disease worsening in setting of heart failure/shock and not acute infection. Impella was removed on 07/09.  Patient continued to remain mostly unresponsive on the vent.  He did exhibit some movement, none of which was purposeful.    He underwent Tracheostomy placement on 08/20/2019.  His neuro status improved and he has started following commands.  He developed some episodic Atrial Fibrillation.  He was treated with IV Amiodarone for this.  He developed worsening bilirubin levels with associated jaundice.  He also developed a fever and leukocytosis.  ID consult was obtained and it was ultimately felt his  gallbladder was the source of new fever with concern for acalculus cholecystits.  Interventional radiology consult for placement of a cholecystostomy tube.  This was performed on 08/24/2019.  He was weaned off of Vasopressin. He was tolerating TC trials. Blood cultures showed Candida parapsilosis and he was continued on Tazobactam and Fluconazole. He spiked a  fever again to over 101 on 07/20. PICC line was removed and blood cultures were drawn. Aforementioned antibiotic was stopped and Vancomycin and Meropenem were started on 07/23. Neurologically, he gradually became more alert. He did have symptomatic anemia post op and transfusions were given accordingly. He also was found to be HIT positive and started on Bivalirudin. IR placed a PEG on 07/27. Bivalirudin was stopped 07/27 and he was started on Apixaban 5 mg bid. His mental status continued to improve. 7/29 the patient had some agitations and restlessness therefore, Trazodone was added QHS. We were Weaning midodrine, and attempting diuresis  7/30 we continued weaning on PSV.    8/01 the patient was on 40% ATC with significantly improved mental status. We continued ongoing attempts at trach weaning complicated by weakness, secretion management, and anxiety. On 8/8 the patient was started back on meropenem and milrinone. On 8/12 the patient continued making slow improvements, and he has been able to tolerate short course ATC. On 8/13 we were able to wean off milrinone. On 8/14 a  code blue called for hypoxemia leading to PEA arrest. ROSC achieved after 6 min. The next morning patient is unfazed from his recent events and continues asking for water to drink. Awake, alert and follows commands. On 8/16 he was [name] by surg per wound care recs-->"Do not recommend any acute surgical intervention. Will ultimately defer wound care recommendations to the wound care team, it was decided to decrease hydrotherapy to 3x per week. Continue dressing changes BID.  On 8/18 We were able to  downsized his tracheostomy to 7.5.  We continue to Attempt short trials of aerosol trach collar. The patient was now in a negative fluid balance and sodium continuing to rise. We  Held diuretics and contined to work on physical therapy and reconditioning. The patient got a unit of blood in am for hgb 7.3. F/U hgb was 6.8, had scant amt of bloody  tracheal out-put but not enough to account for hgb loss. No frank blood in stools. Two addition units of pRBC were administered. Hgb responded appropriately and was 9.7. Yesterday his GB drain had some bloody output. It was removed by IR due to malposition. We continued to monitor the patient for recurrent cholecystitis.  He continued to work with SLP/RT/PT/OT. It was determined he was ready for transfer to [facility] today.     *Most dictation was performed by Konrad Penta and the rest was taken from CCM significant hospital events.     Latest Vital Signs: Blood pressure (!) 138/57, pulse 82, temperature 98.2 F (36.8 C), temperature source Oral, resp. rate (!) 21, height 5' 9" (1.753 m), weight 73.1 kg, SpO2 100 %.  Physical Exam:  General appearance: alert, cooperative and no distress Heart: regular rate and rhythm, S1, S2 normal, no murmur, click, rub or gallop Lungs: rhonchi bilaterally Abdomen: soft, non-tendero Extremities: + edema in bilateral lower ext. R > L Wound: clean and dry  Discharge Condition:Stable and discharged to LTAC.  Recent laboratory studies:  Lab Results  Component Value Date   WBC 14.6 (H) 10/09/2019   HGB 8.4 (L) 10/09/2019   HCT 28.6 (L) 10/09/2019  MCV 98.6 10/09/2019   PLT 248 10/09/2019   Lab Results  Component Value Date   NA 160 (H) 10/09/2019   K 3.9 10/09/2019   CL 110 10/09/2019   CO2 42 (H) 10/09/2019   CREATININE 0.98 10/09/2019   GLUCOSE 53 (L) 10/09/2019      Diagnostic Studies: DG Chest 1 View  Result Date: 09/17/2019 CLINICAL DATA:  Respiratory failure.  ARDS. EXAM: CHEST  1 VIEW COMPARISON:  September 16, 2019 FINDINGS: Tracheostomy catheter tip is 6.8 cm above the carina. Central catheter tip is in the superior vena cava. No pneumothorax. There is widespread interstitial and airspace opacity bilaterally, similar to 1 day prior. There are small pleural effusions bilaterally. There is cardiomegaly with pulmonary venous  hypertension. Patient is status post coronary artery bypass grafting. There is a skin fold on the right. No pneumothorax evident. No bone lesions. There are surgical clips in the right axillary region. IMPRESSION: Stable cardiomegaly with a degree of pulmonary vascular congestion. Small pleural effusions. There may well be a degree of underlying interstitial edema. A degree of congestive heart failure is suspected. There is airspace opacity bilaterally which may represent pneumonia and/or alveolar edema. A degree of underlying ARDS cannot be excluded in this circumstance. Tube and catheter positions as described without evident pneumothorax. Electronically Signed   By: Lowella Grip III M.D.   On: 09/17/2019 07:57   DG Chest 1 View  Result Date: 09/10/2019 CLINICAL DATA:  Respiratory failure EXAM: CHEST  1 VIEW COMPARISON:  September 09, 2019 FINDINGS: Tracheostomy catheter tip is 6.2 cm above the carina. There is a chest tube on each side. There is a stable small right apical pneumothorax. There are areas of patchy interstitial and airspace opacity, essentially stable, with small left pleural effusion. Heart is prominent with pulmonary vascularity normal. Patient is status post coronary artery bypass grafting. IMPRESSION: Tube positions as described. Stable small right apical pneumothorax. Small left pleural effusion with areas of patchy interstitial and alveolar airspace opacity, likely edema, stable. No new opacity evident. Stable cardiomegaly with postoperative changes. Electronically Signed   By: Lowella Grip III M.D.   On: 09/10/2019 08:04   DG Chest Port 1 View  Result Date: 10/08/2019 CLINICAL DATA:  Respiratory failure, tracheostomy. EXAM: PORTABLE CHEST 1 VIEW COMPARISON:  October 07, 2019. FINDINGS: Stable cardiomediastinal silhouette. Tracheostomy tube is unchanged in position. Right-sided PICC line is unchanged in position. No pneumothorax is noted. Stable bilateral lung opacities are noted  concerning for pneumonia. Possible small right pleural effusion is noted. Bony thorax is unremarkable. IMPRESSION: Stable support apparatus. Stable bilateral lung opacities are noted concerning for pneumonia. Electronically Signed   By: Marijo Conception M.D.   On: 10/08/2019 08:43   DG Chest Port 1 View  Result Date: 10/07/2019 CLINICAL DATA:  Prior CABG procedure. Evaluate pleural effusions and pulmonary infiltrates. EXAM: PORTABLE CHEST 1 VIEW COMPARISON:  10/05/2019 FINDINGS: Previous median sternotomy and CABG procedure. Tracheostomy tube tip is above the carina. Right arm PICC line tip is at the cavoatrial junction. Stable cardiomediastinal contours. Left pleural effusion which appears partially loculated is similar in volume to the previous exam. Diffuse bilateral interstitial and airspace densities are unchanged. IMPRESSION: 1. No change in aeration to the lungs compared with previous exam. 2. No change in loculated left pleural effusion. Electronically Signed   By: Kerby Moors M.D.   On: 10/07/2019 09:32   DG Chest Port 1 View  Result Date: 10/05/2019 CLINICAL DATA:  Tracheostomy.  Respiratory  failure. EXAM: PORTABLE CHEST 1 VIEW COMPARISON:  10/04/2019. FINDINGS: Tracheostomy tube and right PICC line in stable position. Prior CABG. Stable cardiomegaly. Low lung volumes. Diffuse severe bilateral pulmonary infiltrates/edema again noted. Left pleural effusion again noted. No pneumothorax. IMPRESSION: 1.  Lines and tubes stable position. 2.  Prior CABG.  Stable cardiomegaly. 3. Low lung volumes. Diffuse severe bilateral pulmonary infiltrates/edema again noted. Left pleural effusion again noted. Similar findings noted on prior exam. Electronically Signed   By: [place]   On: 10/05/2019 06:53   DG Chest Port 1 View  Result Date: 10/04/2019 CLINICAL DATA:  Tracheostomy tube, respiratory failure EXAM: PORTABLE CHEST 1 VIEW COMPARISON:  10/02/2019 FINDINGS: Tracheostomy tube in place.  RIGHT-sided PICC line in place as well with similar position terminating at the caval to atrial junction. Signs of median sternotomy and CABG. Dense consolidative changes in the LEFT lung base with obscured LEFT hemidiaphragm likely with enlarged pleural effusion in the LEFT chest compared to the prior study. Increased interstitial markings airspace disease scattered about the chest with similar appearance. On limited assessment visualized skeletal structures without acute process. IMPRESSION: 1. Dense consolidative changes in the LEFT lung base with enlarged LEFT pleural effusion compared to the prior study. Findings may represent combination of effusion and volume loss or worsening of pneumonia. 2. Increased interstitial markings scattered about the chest may represent multifocal infection or edema. Suggest follow-up to ensure resolution. Electronically Signed   By: Zetta Bills M.D.   On: 10/04/2019 07:55   DG Chest Port 1 View  Result Date: 10/02/2019 CLINICAL DATA:  Tracheostomy, CHF, respiratory failure EXAM: PORTABLE CHEST 1 VIEW COMPARISON:  Portable exam 0517 hours compared to 09/26/2019 FINDINGS: Tracheostomy tube and RIGHT arm PICC line stable. Enlargement of cardiac silhouette post CABG. Severe diffuse BILATERAL pulmonary infiltrates, unchanged. No pleural effusion or pneumothorax. IMPRESSION: Severe diffuse BILATERAL pulmonary infiltrates, cysts may either represent pulmonary edema or multifocal pneumonia, not significantly changed. Electronically Signed   By: Lavonia Dana M.D.   On: 10/02/2019 07:30   DG Chest Port 1 View  Result Date: 09/27/2019 CLINICAL DATA:  Acute respiratory failure EXAM: PORTABLE CHEST 1 VIEW COMPARISON:  09/25/2019 FINDINGS: Single frontal view of the chest demonstrates stable tracheostomy tube and right-sided PICC. Cardiac silhouette is enlarged but stable. Diffuse bilateral ground-glass airspace disease unchanged. Small bilateral pleural effusions are again noted,  stable. No evidence of pneumothorax. IMPRESSION: 1. Stable widespread airspace disease and small bilateral pleural effusions. Electronically Signed   By: Randa Ngo M.D.   On: 09/27/2019 22:21   DG Chest Port 1 View  Result Date: 09/25/2019 CLINICAL DATA:  Tracheostomy. EXAM: PORTABLE CHEST 1 VIEW COMPARISON:  09/22/2019. FINDINGS: Tracheostomy tube, right PICC line in stable position. Prior CABG. Cardiomegaly. Diffuse severe bilateral pulmonary infiltrates/edema. Small bilateral pleural effusions. Similar findings noted on prior exam. IMPRESSION: 1.  Tracheostomy tube and right PICC line stable position. 2.  Prior CABG.  Stable cardiomegaly. 3. Diffuse bilateral pulmonary infiltrates/edema and small bilateral pleural effusions again noted without interim change. Electronically Signed   By: Marcello Moores  Register   On: 09/25/2019 06:32   DG CHEST PORT 1 VIEW  Result Date: 09/22/2019 CLINICAL DATA:  Acute respiratory distress. EXAM: PORTABLE CHEST 1 VIEW COMPARISON:  September 22, 2019 (5:19 a.m.) FINDINGS: There is stable right-sided PICC line and tracheostomy tube positioning. Multiple sternal wires and vascular clips are noted. Moderate severity diffuse bilateral infiltrates are [name]. More focal infiltrates are again noted within the right upper lobe and  bilateral lung bases. There is a small right pleural effusion. No pneumothorax is identified. There is stable enlargement of the cardiac silhouette. The visualized skeletal structures are unremarkable. IMPRESSION: 1. No interval change in the moderate severity bilateral infiltrates, with more focal infiltrates again noted within the right upper lobe and bilateral lung bases. 2. Small right pleural effusion. Electronically Signed   By: Virgina Norfolk M.D.   On: 09/22/2019 21:20   DG Chest Port 1 View  Result Date: 09/22/2019 CLINICAL DATA:  Congestive failure EXAM: PORTABLE CHEST 1 VIEW COMPARISON:  09/20/2019 FINDINGS: Cardiac shadow remains enlarged.  Postsurgical changes are again [name]. Tracheostomy tube and right-sided PICC line are again noted and stable. Diffuse airspace opacities are again identified throughout both lungs but slightly improved when compared with the prior exam. No new focal abnormality is noted. IMPRESSION: Slight improvement in bilateral airspace opacities when compared with the prior study. Electronically Signed   By: Inez Catalina M.D.   On: 09/22/2019 06:05   DG Chest Port 1 View  Result Date: 09/20/2019 CLINICAL DATA:  Follow-up tracheostomy EXAM: PORTABLE CHEST 1 VIEW COMPARISON:  09/18/2019 FINDINGS: Cardiac shadow is enlarged but stable. Postsurgical changes are again [name]. Tracheostomy tube and right-sided PICC line are noted and stable. Diffuse airspace opacities are again identified similar to that [name] on the prior exam. Small right-sided pleural effusion is noted. No bony abnormality is noted. IMPRESSION: Overall stable appearance of the chest when compared with the prior exam. Electronically Signed   By: Inez Catalina M.D.   On: 09/20/2019 08:20   DG Chest Port 1 View  Result Date: 09/18/2019 CLINICAL DATA:  Respiratory failure EXAM: PORTABLE CHEST 1 VIEW COMPARISON:  09/17/2019 FINDINGS: Cardiac shadow is enlarged but stable. Postsurgical changes are again [name]. Right-sided PICC line and tracheostomy tube are again noted and stable. Diffuse bilateral airspace opacities are [name] although slightly improved when compare with the prior exam. Stable bilateral small effusions are [name]. IMPRESSION: Slight improved aeration when compared with the prior exam. Electronically Signed   By: Inez Catalina M.D.   On: 09/18/2019 08:06   DG Chest Port 1 View  Result Date: 09/16/2019 CLINICAL DATA:  Status post CABG EXAM: PORTABLE CHEST 1 VIEW COMPARISON:  09/15/2019 FINDINGS: Tracheostomy in satisfactory position. Multifocal patchy opacities in the lungs bilaterally, right lower lung predominant, similar to the prior. Small bilateral  pleural effusions are suspected. No pneumothorax. The heart is top-normal in size. Postsurgical changes related to prior CABG. Median sternotomy. IMPRESSION: Stable multifocal airspace opacities, right lung predominant. Suspected small bilateral pleural effusions. Electronically Signed   By: Julian Hy M.D.   On: 09/16/2019 08:48   DG Chest Port 1 View  Result Date: 09/15/2019 CLINICAL DATA:  CHF exacerbation EXAM: PORTABLE CHEST 1 VIEW COMPARISON:  09/13/2019 FINDINGS: Unchanged tracheostomy tube. Patchy airspace filling opacities are significantly increased throughout the RIGHT lung and appear stable in the LEFT lung. Bilateral small pleural effusions. IMPRESSION: Increased airspace filling opacities throughout the RIGHT lung. Electronically Signed   By: Nolon Nations M.D.   On: 09/15/2019 09:20   DG Chest Port 1 View  Result Date: 09/13/2019 CLINICAL DATA:  Tracheostomy.  Open-heart surgery.  Abdominal pain. EXAM: PORTABLE CHEST 1 VIEW COMPARISON:  09/11/2019. FINDINGS: Tracheostomy tube in stable position. Prior CABG. Stable cardiomegaly. Diffuse bilateral pulmonary infiltrates/edema again noted. Small bilateral pleural effusions again noted. No pneumothorax. Surgical clips right axilla. IMPRESSION: 1.  Tracheostomy tube stable position. 2.  Prior CABG.  Stable cardiomegaly. 3.  Diffuse bilateral pulmonary infiltrates/edema again noted without interim change. Small bilateral pleural effusions again noted. No pneumothorax. Electronically Signed   By: Marcello Moores  Register   On: 09/13/2019 06:33   DG Chest Port 1 View  Result Date: 09/11/2019 CLINICAL DATA:  Tracheostomy.  Respiratory failure and CHF EXAM: PORTABLE CHEST 1 VIEW COMPARISON:  Yesterday FINDINGS: Mild cardiomegaly. There has been CABG. Diffuse interstitial opacity with hazy airspace density. Small pleural effusions. No pneumothorax. Tracheostomy tube remains well seated. IMPRESSION: Unchanged pulmonary edema and small pleural effusions.  Electronically Signed   By: Monte Fantasia M.D.   On: 09/11/2019 06:44   IR CHOLANGIOGRAM EXISTING TUBE  Result Date: 10/09/2019 Narrative & Impression INDICATION: Bloody output from the percutaneous cholecystostomy   EXAM: FLUOROSCOPIC INJECTION OF THE PERCUTANEOUS CHOLECYSTOSTOMY   MEDICATIONS: The patient is currently admitted to the hospital and receiving intravenous antibiotics. The antibiotics were administered within an appropriate time frame prior to the initiation of the procedure.   ANESTHESIA/SEDATION: Moderate Sedation Time:  NONE.   The patient was continuously monitored during the procedure by the interventional radiology nurse under my direct supervision.   COMPLICATIONS: None immediate.   PROCEDURE: Under sterile conditions, the existing cholecystostomy was injected with contrast. Fluoroscopic imaging performed. Catheter has retracted into the peritoneal cavity and is within the perihepatic ascites.   IMPRESSION: Cholecystostomy has retracted into the perihepatic ascites.   Catheter cut and removed.   These results were called by telephone at the time of interpretation on 10/08/2019 at 4:11 pm to provider Erskine Emery, MD, Who verbally acknowledged these results.     Electronically Signed   By: Jerilynn Mages.  Shick M.D.   On: 10/08/2019 16:14   [place] EKG SITE RITE  Result Date: 09/16/2019 If Site Rite image not attached, placement could not be confirmed due to current cardiac rhythm.  US Abdomen Limited RUQ  Result Date: 10/05/2019 CLINICAL DATA:  Elevated bilirubin. EXAM: ULTRASOUND ABDOMEN LIMITED RIGHT UPPER QUADRANT COMPARISON:  CT abdomen and pelvis dated August 28, 2019. FINDINGS: Gallbladder: Percutaneous cholecystostomy tube in place. Small stones and sludge. No wall thickening visualized. No sonographic Murphy sign noted by sonographer. Common bile duct: Diameter: 6 mm, normal. Liver: No focal lesion identified. Nodular contour. Within normal limits in parenchymal echogenicity. Portal vein is patent  on color Doppler imaging with normal direction of blood flow towards the liver. Other: Small perihepatic ascites.  Right pleural effusion. IMPRESSION: 1. No acute findings. 2. Percutaneous cholecystostomy tube in place. Cholelithiasis and sludge. 3. Cirrhosis with small perihepatic ascites. 4. Right pleural effusion. Electronically Signed   By: Titus Dubin M.D.   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, SRA positive   Other    Other reaction(s): Unknown   Simvastatin    Other reaction(s): diarrhea   Sitagliptin    Other reaction(s): diarrhea/abd pain        Medication List     STOP taking these medications    citalopram 40 MG tablet Commonly known as: CELEXA   ergocalciferol 1.25 MG (50000 UT) capsule Commonly known as: VITAMIN D2   Farxiga 10 MG Tabs tablet Generic drug: dapagliflozin propanediol   furosemide 20 MG tablet Commonly known as: LASIX   glipiZIDE-metformin 2.5-500 MG tablet Commonly known as: METAGLIP   ibuprofen 200  MG tablet Commonly known as: ADVIL   levofloxacin 750 MG tablet Commonly known as: LEVAQUIN   lisinopril 20 MG tablet Commonly known as: ZESTRIL   pioglitazone 45 MG tablet Commonly known as: ACTOS   promethazine 25 MG tablet Commonly known as: PHENERGAN       TAKE these medications    acetaminophen 160 MG/5ML solution Commonly known as: TYLENOL Place 20.3 mLs (650 mg total) into feeding tube every 6 (six) hours as needed for fever.   aspirin 81 MG chewable tablet Place 1 tablet (81 mg total) into feeding tube daily. Start taking on: October 10, 2019   chlorhexidine gluconate (MEDLINE KIT) 0.12 % solution Commonly known as: PERIDEX 15 mLs by Mouth Rinse route 2 (two) times daily.   Chlorhexidine Gluconate Cloth 2 % Pads Apply 6 each topically daily. Start  taking on: October 10, 2019   clonazePAM 0.5 MG tablet Commonly known as: KLONOPIN Place 1 tablet (0.5 mg total) into feeding tube 2 (two) times daily.   collagenase ointment Commonly known as: SANTYL Apply topically 2 (two) times daily.   dextrose 50 % solution Inject 0-50 mLs into the vein as needed for low blood sugar.   feeding supplement (PIVOT 1.5 CAL) Liqd Place 1,000 mLs into feeding tube continuous.   nutrition supplement (JUVEN) Pack Place 1 packet into feeding tube 2 (two) times daily between meals.   fentaNYL 100 MCG/2ML injection Commonly known as: SUBLIMAZE Inject 1 mL (50 mcg total) into the vein every 3 (three) hours as needed for up to 7 days for severe pain.   fentaNYL 75 MCG/HR Commonly known as: [place] 1 patch onto the skin every 3 (three) days. Start taking on: October 10, 2019   FLUoxetine 20 MG/5ML solution Commonly known as: PROZAC Place 5 mLs (20 mg total) into feeding tube daily. Start taking on: October 10, 2019   free water Soln Place 400 mLs into feeding tube every 2 (two) hours.   Gerhardt's butt cream Crea Apply 1 application topically as needed for irritation.   hydrocerin Crea Apply 1 application topically daily. Start taking on: October 10, 2019   insulin aspart 100 UNIT/ML injection Commonly known as: novoLOG Inject 0-9 Units into the skin every 4 (four) hours.   insulin aspart 100 UNIT/ML injection Commonly known as: novoLOG Inject 2 Units into the skin every 4 (four) hours.   insulin detemir 100 UNIT/ML injection Commonly known as: LEVEMIR Inject 0.1 mLs (10 Units total) into the skin 2 (two) times daily.   ipratropium-albuterol 0.5-2.5 (3) MG/3ML Soln Commonly known as: DUONEB Take 3 mLs by nebulization 3 (three) times daily.   lidocaine 5 % Commonly known as: LIDODERM Place 1 patch onto the skin daily. Remove & Discard patch within 12 hours or as directed by MD   lip balm ointment Apply topically as  needed for lip care.   metoprolol tartrate 5 MG/5ML Soln injection Commonly known as: LOPRESSOR Inject 2.5-5 mLs (2.5-5 mg total) into the vein every 2 (two) hours as needed (HR > 130 OR may use prn for SBP > 150 if HR > 70).   midodrine 5 MG tablet Commonly known as: PROAMATINE Place 1 tablet (5 mg total) into feeding tube 3 (three) times daily with meals.   mouth rinse Liqd solution 15 mLs by Mouth Rinse route daily.   ondansetron 4 MG/2ML Soln injection Commonly known as: ZOFRAN Inject 2 mLs (4 mg total) into the vein every 6 (six) hours as needed for nausea  or vomiting.   oxyCODONE 5 MG immediate release tablet Commonly known as: Oxy IR/ROXICODONE Place 1-2 tablets (5-10 mg total) into feeding tube every 3 (three) hours as needed for up to 7 days for moderate pain. What changed:  medication strength how much to take how to take this when to take this reasons to take this   pantoprazole 40 MG injection Commonly known as: PROTONIX Inject 40 mg into the vein every 12 (twelve) hours.   QUEtiapine 50 MG tablet Commonly known as: SEROQUEL Take 1 tablet (50 mg total) by mouth at bedtime.   silver nitrate applicators 53-61 % applicator Apply 1 Stick topically as needed for bleeding (PRN bleeding after hydrotherapy).   sodium chloride flush 0.9 % Soln Commonly known as: NS Inject 3 mLs into the vein every 12 (twelve) hours.   sodium chloride flush 0.9 % Soln Commonly known as: NS 10-40 mLs by Intracatheter route as needed (flush).   sodium chloride HYPERTONIC 3 % nebulizer solution Take 4 mLs by nebulization 2 (two) times daily.   testosterone 50 MG/5GM (1%) Gel Commonly known as: ANDROGEL Place 5 g onto the skin daily. Start taking on: October 10, 2019   thiamine 100 MG tablet Place 1 tablet (100 mg total) into feeding tube daily. Start taking on: October 10, 2019   traZODone 50 MG tablet Commonly known as: DESYREL Place 1 tablet (50 mg total) into feeding  tube at bedtime as needed for sleep.       The patient has been discharged on:   1.Beta Blocker:  Yes [   ]                              No   [ no  ]                              If No, reason: on midodrine  2.Ace Inhibitor/ARB: Yes [   ]                                     No  [ no   ]                                     If No, reason: on midodrine  3.Statin:   Yes [   ]                  No  [ no  ]                  If No, reason: allergy  4.Shela Commons:  Yes  [ yes  ]                  No   [   ]                  If No, reason:  Follow Up Appointments:  Follow-up Information     Melrose Nakayama, MD Follow up.   Specialty: Cardiothoracic Surgery Why: Your routine follow-up appointment is on 10/30/2019 at 3pm. Please arrive at 2:30pm for a chest xray located at [facility] which is on the first floor of our building Contact information: [place]  [address] [place] [address] [phone]         Bonnita Nasuti, MD. Call in 1 day(s).   Specialty: Internal Medicine Contact information: [address] [place] [address] [phone]         Burnell Blanks, MD Follow up.   Specialty: Cardiology Why: Please call the office for an appointment  Contact information: [place]  [address] [phone]                    Signed: Terance Hart ContePA-C 10/09/2019, 2:23 PM

## 2019-08-02 NOTE — Brief Op Note (Addendum)
07/26/2019 - 08/02/2019  12:09 PM  PATIENT:  Todd [name]  64 y.o. male  PRE-OPERATIVE DIAGNOSIS:  CAD  POST-OPERATIVE DIAGNOSIS:  CAD  PROCEDURE:  Procedure(s): CORONARY ARTERY BYPASS GRAFTING (CABG), ON PUMP, TIMES , USING LEFT INTERNAL MAMMARY, LEFT RADIAL ARTERY, AND ENDOSCOPICALLY HARVESTED RIGHT GREATER SAPHENOUS VEIN (N/A) TRANSESOPHAGEAL ECHOCARDIOGRAM (TEE) (N/A) LEFT RADIAL ARTERY HARVEST (Left)  SURGEON: [name], MD - Primary  PHYSICIAN ASSISTANT: Ricco Dershem  ANESTHESIA:   general  EBL:  Per perfusion and anesthesia records   BLOOD ADMINISTERED:none  DRAINS: Mediastinal and left pleural drains   LOCAL MEDICATIONS USED:  NONE  SPECIMEN:  No Specimen  DISPOSITION OF SPECIMEN:  N/A  COUNTS:  Counts correct except missing a single 8-0 needle  DICTATION: .Dragon Dictation  PLAN OF CARE: Admit to inpatient   PATIENT DISPOSITION:  ICU - intubated and hemodynamically stable.   Delay start of Pharmacological VTE agent (>24hrs) due to surgical blood loss or risk of bleeding: yes

## 2019-08-02 NOTE — Anesthesia Procedure Notes (Signed)
Central Venous Catheter Insertion Performed by: Murvin Natal, MD, anesthesiologist Start/End6/24/2021 6:45 AM, 08/02/2019 7:00 AM Patient location: Pre-op. Preanesthetic checklist: patient identified, IV checked, site marked, risks and benefits discussed, surgical consent, monitors and equipment checked, pre-op evaluation, timeout performed and anesthesia consent Position: Trendelenburg Lidocaine 1% used for infiltration and patient sedated Hand hygiene performed , maximum sterile barriers used  and Seldinger technique used Catheter size: 9 Fr Total catheter length 12. PA cath was placed.MAC introducer Swan type:thermodilution PA Cath depth:46 Procedure performed using ultrasound guided technique. Ultrasound Notes:anatomy identified, needle tip was noted to be adjacent to the nerve/plexus identified and no ultrasound evidence of intravascular and/or intraneural injection Attempts: 1 Following insertion, line sutured, dressing applied and Biopatch. Post procedure assessment: blood return through all ports, free fluid flow and no air  Patient tolerated the procedure well with no immediate complications.

## 2019-08-02 NOTE — Progress Notes (Signed)
RT placed pt back on full vent support; wean terminated d/t excessive chest tube drainage and receiving blood products. RN will let RT know when we can start wean process again. RT will continue to monitor.

## 2019-08-02 NOTE — Anesthesia Procedure Notes (Signed)
Procedure Name: Intubation Date/Time: 08/02/2019 8:12 AM Performed by: Candis Shine, CRNA Pre-anesthesia Checklist: Patient identified, Emergency Drugs available, Suction available and Patient being monitored Patient Re-evaluated:Patient Re-evaluated prior to induction Oxygen Delivery Method: Circle System Utilized Preoxygenation: Pre-oxygenation with 100% oxygen Induction Type: IV induction Ventilation: Two handed mask ventilation required and Oral airway inserted - appropriate to patient size Laryngoscope Size: Mac and 4 Grade View: Grade I Tube type: Oral Tube size: 8.0 mm Number of attempts: 1 Airway Equipment and Method: Stylet and Oral airway Placement Confirmation: ETT inserted through vocal cords under direct vision,  positive ETCO2 and breath sounds checked- equal and bilateral Secured at: 23 cm Tube secured with: Tape Dental Injury: Teeth and Oropharynx as per pre-operative assessment

## 2019-08-02 NOTE — Anesthesia Procedure Notes (Signed)
Arterial Line Insertion Start/End6/24/2021 6:55 AM Performed by: Jed Limerick, CRNA, CRNA  Patient location: Pre-op. Preanesthetic checklist: patient identified, IV checked, site marked, risks and benefits discussed, surgical consent, monitors and equipment checked, pre-op evaluation, timeout performed and anesthesia consent Lidocaine 1% used for infiltration Right, radial was placed Catheter size: 20 G Hand hygiene performed  and maximum sterile barriers used   Attempts: 2 Procedure performed without using ultrasound guided technique. Following insertion, dressing applied and Biopatch. Post procedure assessment: normal and unchanged  Patient tolerated the procedure well with no immediate complications.

## 2019-08-02 NOTE — Progress Notes (Signed)
RT assessed pt for readiness to advance to next phase of wean. Pt able to perform all previous task without difficulty. RT placed pt in CPAP/PSV mode 10/5 40%. Pt respiratory status is stable at this time. RT will continue to monitor.

## 2019-08-02 NOTE — OR Nursing (Signed)
Count was incorrect. Missing an 8-0 needle. Dr. Dorris Fetch was notified and did not want an x-ray. Dr. Dorris Fetch states that an 8-0 needle does not show up on an X-ray.

## 2019-08-03 ENCOUNTER — Encounter (HOSPITAL_COMMUNITY): Payer: Self-pay | Admitting: Thoracic Surgery (Cardiothoracic Vascular Surgery)

## 2019-08-03 ENCOUNTER — Encounter (HOSPITAL_COMMUNITY)
Admission: RE | Disposition: A | Discharge status: Nursing Home (Includes ICF) | Payer: Self-pay | Source: Home / Self Care | Attending: Thoracic Surgery (Cardiothoracic Vascular Surgery)

## 2019-08-03 ENCOUNTER — Inpatient Hospital Stay (HOSPITAL_COMMUNITY): Payer: Medicare Other

## 2019-08-03 ENCOUNTER — Encounter (HOSPITAL_COMMUNITY): Payer: Self-pay | Admitting: Anesthesiology

## 2019-08-03 DIAGNOSIS — I469 Cardiac arrest, cause unspecified: Secondary | ICD-10-CM

## 2019-08-03 DIAGNOSIS — R570 Cardiogenic shock: Secondary | ICD-10-CM

## 2019-08-03 HISTORY — PX: CANNULATION FOR ECMO (EXTRACORPOREAL MEMBRANE OXYGENATION): SHX6796

## 2019-08-03 HISTORY — PX: WOUND DEBRIDEMENT: SHX247

## 2019-08-03 LAB — CBC
HCT: 27 % — ABNORMAL LOW (ref 39.0–52.0)
HCT: 23.4 % — ABNORMAL LOW (ref 39.0–52.0)
HCT: 31.7 % — ABNORMAL LOW (ref 39.0–52.0)
HCT: 26.2 % — ABNORMAL LOW (ref 39.0–52.0)
HCT: 21.3 % — ABNORMAL LOW (ref 39.0–52.0)
Hemoglobin: 8.7 g/dL — ABNORMAL LOW (ref 13.0–17.0)
Hemoglobin: 7.7 g/dL — ABNORMAL LOW (ref 13.0–17.0)
Hemoglobin: 10.9 g/dL — ABNORMAL LOW (ref 13.0–17.0)
Hemoglobin: 9 g/dL — ABNORMAL LOW (ref 13.0–17.0)
Hemoglobin: 7.4 g/dL — ABNORMAL LOW (ref 13.0–17.0)
MCH: 27.9 pg (ref 26.0–34.0)
MCH: 29.1 pg (ref 26.0–34.0)
MCH: 29.5 pg (ref 26.0–34.0)
MCH: 29.1 pg (ref 26.0–34.0)
MCH: 29.1 pg (ref 26.0–34.0)
MCHC: 32.2 g/dL (ref 30.0–36.0)
MCHC: 32.9 g/dL (ref 30.0–36.0)
MCHC: 34.4 g/dL (ref 30.0–36.0)
MCHC: 34.4 g/dL (ref 30.0–36.0)
MCHC: 34.7 g/dL (ref 30.0–36.0)
MCV: 86.5 fL (ref 80.0–100.0)
MCV: 88.3 fL (ref 80.0–100.0)
MCV: 85.9 fL (ref 80.0–100.0)
MCV: 84.8 fL (ref 80.0–100.0)
MCV: 83.9 fL (ref 80.0–100.0)
Platelets: 112 10*3/uL — ABNORMAL LOW (ref 150–400)
Platelets: 70 10*3/uL — ABNORMAL LOW (ref 150–400)
Platelets: 43 10*3/uL — ABNORMAL LOW (ref 150–400)
Platelets: 57 10*3/uL — ABNORMAL LOW (ref 150–400)
Platelets: 81 10*3/uL — ABNORMAL LOW (ref 150–400)
RBC: 3.12 MIL/uL — ABNORMAL LOW (ref 4.22–5.81)
RBC: 2.65 MIL/uL — ABNORMAL LOW (ref 4.22–5.81)
RBC: 3.69 MIL/uL — ABNORMAL LOW (ref 4.22–5.81)
RBC: 3.09 MIL/uL — ABNORMAL LOW (ref 4.22–5.81)
RBC: 2.54 MIL/uL — ABNORMAL LOW (ref 4.22–5.81)
RDW: 14.6 % (ref 11.5–15.5)
RDW: 14.7 % (ref 11.5–15.5)
RDW: 13.7 % (ref 11.5–15.5)
RDW: 13.9 % (ref 11.5–15.5)
RDW: 14 % (ref 11.5–15.5)
WBC: 13 10*3/uL — ABNORMAL HIGH (ref 4.0–10.5)
WBC: 11.9 10*3/uL — ABNORMAL HIGH (ref 4.0–10.5)
WBC: 9.1 10*3/uL (ref 4.0–10.5)
WBC: 7.9 10*3/uL (ref 4.0–10.5)
WBC: 7.9 10*3/uL (ref 4.0–10.5)
nRBC: 0 % (ref 0.0–0.2)
nRBC: 0 % (ref 0.0–0.2)
nRBC: 0 % (ref 0.0–0.2)
nRBC: 0 % (ref 0.0–0.2)
nRBC: 0 % (ref 0.0–0.2)

## 2019-08-03 LAB — BPAM PLATELET PHERESIS
Blood Product Expiration Date: 202106272359
Blood Product Expiration Date: 202106272359
ISSUE DATE / TIME: 202106242243
ISSUE DATE / TIME: 202106250712
Unit Type and Rh: 6200
Unit Type and Rh: 6200

## 2019-08-03 LAB — POCT I-STAT 7, (LYTES, BLD GAS, ICA,H+H)
Acid-Base Excess: 2 mmol/L (ref 0.0–2.0)
Acid-Base Excess: 2 mmol/L (ref 0.0–2.0)
Acid-Base Excess: 3 mmol/L — ABNORMAL HIGH (ref 0.0–2.0)
Acid-base deficit: 2 mmol/L (ref 0.0–2.0)
Acid-base deficit: 2 mmol/L (ref 0.0–2.0)
Acid-base deficit: 4 mmol/L — ABNORMAL HIGH (ref 0.0–2.0)
Acid-base deficit: 3 mmol/L — ABNORMAL HIGH (ref 0.0–2.0)
Bicarbonate: 24.2 mmol/L (ref 20.0–28.0)
Bicarbonate: 27 mmol/L (ref 20.0–28.0)
Bicarbonate: 22 mmol/L (ref 20.0–28.0)
Bicarbonate: 19.7 mmol/L — ABNORMAL LOW (ref 20.0–28.0)
Bicarbonate: 20.5 mmol/L (ref 20.0–28.0)
Bicarbonate: 25.1 mmol/L (ref 20.0–28.0)
Bicarbonate: 25.9 mmol/L (ref 20.0–28.0)
Calcium, Ion: 1.2 mmol/L (ref 1.15–1.40)
Calcium, Ion: 1.14 mmol/L — ABNORMAL LOW (ref 1.15–1.40)
Calcium, Ion: 0.98 mmol/L — ABNORMAL LOW (ref 1.15–1.40)
Calcium, Ion: 0.91 mmol/L — ABNORMAL LOW (ref 1.15–1.40)
Calcium, Ion: 0.96 mmol/L — ABNORMAL LOW (ref 1.15–1.40)
Calcium, Ion: 1.06 mmol/L — ABNORMAL LOW (ref 1.15–1.40)
Calcium, Ion: 1.11 mmol/L — ABNORMAL LOW (ref 1.15–1.40)
HCT: 25 % — ABNORMAL LOW (ref 39.0–52.0)
HCT: 25 % — ABNORMAL LOW (ref 39.0–52.0)
HCT: 19 % — ABNORMAL LOW (ref 39.0–52.0)
HCT: 30 % — ABNORMAL LOW (ref 39.0–52.0)
HCT: 27 % — ABNORMAL LOW (ref 39.0–52.0)
HCT: 21 % — ABNORMAL LOW (ref 39.0–52.0)
HCT: 20 % — ABNORMAL LOW (ref 39.0–52.0)
Hemoglobin: 8.5 g/dL — ABNORMAL LOW (ref 13.0–17.0)
Hemoglobin: 8.5 g/dL — ABNORMAL LOW (ref 13.0–17.0)
Hemoglobin: 6.5 g/dL — CL (ref 13.0–17.0)
Hemoglobin: 10.2 g/dL — ABNORMAL LOW (ref 13.0–17.0)
Hemoglobin: 9.2 g/dL — ABNORMAL LOW (ref 13.0–17.0)
Hemoglobin: 7.1 g/dL — ABNORMAL LOW (ref 13.0–17.0)
Hemoglobin: 6.8 g/dL — CL (ref 13.0–17.0)
O2 Saturation: 96 %
O2 Saturation: 99 %
O2 Saturation: 100 %
O2 Saturation: 99 %
O2 Saturation: 100 %
O2 Saturation: 100 %
O2 Saturation: 100 %
Patient temperature: 36.5
Patient temperature: 37
Patient temperature: 36.6
Patient temperature: 36.8
Patient temperature: 36.8
Potassium: 4.7 mmol/L (ref 3.5–5.1)
Potassium: 4.8 mmol/L (ref 3.5–5.1)
Potassium: 3.3 mmol/L — ABNORMAL LOW (ref 3.5–5.1)
Potassium: 3.4 mmol/L — ABNORMAL LOW (ref 3.5–5.1)
Potassium: 3.3 mmol/L — ABNORMAL LOW (ref 3.5–5.1)
Potassium: 3.4 mmol/L — ABNORMAL LOW (ref 3.5–5.1)
Potassium: 3.4 mmol/L — ABNORMAL LOW (ref 3.5–5.1)
Sodium: 140 mmol/L (ref 135–145)
Sodium: 140 mmol/L (ref 135–145)
Sodium: 143 mmol/L (ref 135–145)
Sodium: 145 mmol/L (ref 135–145)
Sodium: 144 mmol/L (ref 135–145)
Sodium: 145 mmol/L (ref 135–145)
Sodium: 145 mmol/L (ref 135–145)
TCO2: 26 mmol/L (ref 22–32)
TCO2: 28 mmol/L (ref 22–32)
TCO2: 23 mmol/L (ref 22–32)
TCO2: 21 mmol/L — ABNORMAL LOW (ref 22–32)
TCO2: 21 mmol/L — ABNORMAL LOW (ref 22–32)
TCO2: 26 mmol/L (ref 22–32)
TCO2: 27 mmol/L (ref 22–32)
pCO2 arterial: 48.1 mmHg — ABNORMAL HIGH (ref 32.0–48.0)
pCO2 arterial: 45.8 mmHg (ref 32.0–48.0)
pCO2 arterial: 34.8 mmHg (ref 32.0–48.0)
pCO2 arterial: 31.1 mmHg — ABNORMAL LOW (ref 32.0–48.0)
pCO2 arterial: 29.5 mmHg — ABNORMAL LOW (ref 32.0–48.0)
pCO2 arterial: 31.3 mmHg — ABNORMAL LOW (ref 32.0–48.0)
pCO2 arterial: 32.4 mmHg (ref 32.0–48.0)
pH, Arterial: 7.308 — ABNORMAL LOW (ref 7.350–7.450)
pH, Arterial: 7.378 (ref 7.350–7.450)
pH, Arterial: 7.407 (ref 7.350–7.450)
pH, Arterial: 7.409 (ref 7.350–7.450)
pH, Arterial: 7.448 (ref 7.350–7.450)
pH, Arterial: 7.511 — ABNORMAL HIGH (ref 7.350–7.450)
pH, Arterial: 7.51 — ABNORMAL HIGH (ref 7.350–7.450)
pO2, Arterial: 86 mmHg (ref 83.0–108.0)
pO2, Arterial: 130 mmHg — ABNORMAL HIGH (ref 83.0–108.0)
pO2, Arterial: 291 mmHg — ABNORMAL HIGH (ref 83.0–108.0)
pO2, Arterial: 143 mmHg — ABNORMAL HIGH (ref 83.0–108.0)
pO2, Arterial: 209 mmHg — ABNORMAL HIGH (ref 83.0–108.0)
pO2, Arterial: 233 mmHg — ABNORMAL HIGH (ref 83.0–108.0)
pO2, Arterial: 244 mmHg — ABNORMAL HIGH (ref 83.0–108.0)

## 2019-08-03 LAB — MAGNESIUM
Magnesium: 2.4 mg/dL (ref 1.7–2.4)
Magnesium: 2.1 mg/dL (ref 1.7–2.4)

## 2019-08-03 LAB — DIC (DISSEMINATED INTRAVASCULAR COAGULATION)PANEL
D-Dimer, Quant: 3.48 ug/mL-FEU — ABNORMAL HIGH (ref 0.00–0.50)
D-Dimer, Quant: 2.06 ug/mL-FEU — ABNORMAL HIGH (ref 0.00–0.50)
Fibrinogen: 181 mg/dL — ABNORMAL LOW (ref 210–475)
Fibrinogen: 199 mg/dL — ABNORMAL LOW (ref 210–475)
INR: 2 — ABNORMAL HIGH (ref 0.8–1.2)
INR: 1.6 — ABNORMAL HIGH (ref 0.8–1.2)
Platelets: 71 10*3/uL — ABNORMAL LOW (ref 150–400)
Platelets: 44 10*3/uL — ABNORMAL LOW (ref 150–400)
Prothrombin Time: 21.6 seconds — ABNORMAL HIGH (ref 11.4–15.2)
Prothrombin Time: 18.7 seconds — ABNORMAL HIGH (ref 11.4–15.2)
Smear Review: NONE SEEN
Smear Review: NONE SEEN
aPTT: 98 seconds — ABNORMAL HIGH (ref 24–36)
aPTT: 44 seconds — ABNORMAL HIGH (ref 24–36)

## 2019-08-03 LAB — BASIC METABOLIC PANEL
Anion gap: 7 (ref 5–15)
Anion gap: 18 — ABNORMAL HIGH (ref 5–15)
Anion gap: 16 — ABNORMAL HIGH (ref 5–15)
BUN: 23 mg/dL (ref 8–23)
BUN: 22 mg/dL (ref 8–23)
BUN: 22 mg/dL (ref 8–23)
CO2: 22 mmol/L (ref 22–32)
CO2: 19 mmol/L — ABNORMAL LOW (ref 22–32)
CO2: 20 mmol/L — ABNORMAL LOW (ref 22–32)
Calcium: 7.8 mg/dL — ABNORMAL LOW (ref 8.9–10.3)
Calcium: 7.1 mg/dL — ABNORMAL LOW (ref 8.9–10.3)
Calcium: 8 mg/dL — ABNORMAL LOW (ref 8.9–10.3)
Chloride: 107 mmol/L (ref 98–111)
Chloride: 107 mmol/L (ref 98–111)
Chloride: 105 mmol/L (ref 98–111)
Creatinine, Ser: 1.23 mg/dL (ref 0.61–1.24)
Creatinine, Ser: 1.38 mg/dL — ABNORMAL HIGH (ref 0.61–1.24)
Creatinine, Ser: 1.38 mg/dL — ABNORMAL HIGH (ref 0.61–1.24)
GFR calc Af Amer: >60 mL/min (ref >60)
GFR calc Af Amer: >60 mL/min (ref >60)
GFR calc Af Amer: >60 mL/min (ref >60)
GFR calc non Af Amer: >60 mL/min (ref >60)
GFR calc non Af Amer: 53 mL/min — ABNORMAL LOW (ref >60)
GFR calc non Af Amer: 53 mL/min — ABNORMAL LOW (ref >60)
Glucose, Bld: 101 mg/dL — ABNORMAL HIGH (ref 70–99)
Glucose, Bld: 183 mg/dL — ABNORMAL HIGH (ref 70–99)
Glucose, Bld: 261 mg/dL — ABNORMAL HIGH (ref 70–99)
Potassium: 4.8 mmol/L (ref 3.5–5.1)
Potassium: 3.4 mmol/L — ABNORMAL LOW (ref 3.5–5.1)
Potassium: 3.3 mmol/L — ABNORMAL LOW (ref 3.5–5.1)
Sodium: 136 mmol/L (ref 135–145)
Sodium: 144 mmol/L (ref 135–145)
Sodium: 141 mmol/L (ref 135–145)

## 2019-08-03 LAB — GLUCOSE, CAPILLARY
Glucose-Capillary: 120 mg/dL — ABNORMAL HIGH (ref 70–99)
Glucose-Capillary: 109 mg/dL — ABNORMAL HIGH (ref 70–99)
Glucose-Capillary: 95 mg/dL (ref 70–99)
Glucose-Capillary: 112 mg/dL — ABNORMAL HIGH (ref 70–99)
Glucose-Capillary: 156 mg/dL — ABNORMAL HIGH (ref 70–99)
Glucose-Capillary: 248 mg/dL — ABNORMAL HIGH (ref 70–99)
Glucose-Capillary: 253 mg/dL — ABNORMAL HIGH (ref 70–99)
Glucose-Capillary: 240 mg/dL — ABNORMAL HIGH (ref 70–99)
Glucose-Capillary: 232 mg/dL — ABNORMAL HIGH (ref 70–99)
Glucose-Capillary: 215 mg/dL — ABNORMAL HIGH (ref 70–99)
Glucose-Capillary: 142 mg/dL — ABNORMAL HIGH (ref 70–99)
Glucose-Capillary: 177 mg/dL — ABNORMAL HIGH (ref 70–99)
Glucose-Capillary: 175 mg/dL — ABNORMAL HIGH (ref 70–99)
Glucose-Capillary: 164 mg/dL — ABNORMAL HIGH (ref 70–99)

## 2019-08-03 LAB — GLOBAL TEG PANEL
CFF Max Amplitude: 12.3 mm — ABNORMAL LOW (ref 15–32)
CK with Heparinase (R): 7.4 min (ref 4.3–8.3)
Citrated Functional Fibrinogen: 224.5 mg/dL — ABNORMAL LOW (ref 278–581)
Citrated Kaolin (K): 3.8 min — ABNORMAL HIGH (ref 0.8–2.1)
Citrated Kaolin (MA): 47.5 mm — ABNORMAL LOW (ref 52–69)
Citrated Kaolin (R): 9.5 min — ABNORMAL HIGH (ref 4.6–9.1)
Citrated Kaolin Angle: 51.7 deg — ABNORMAL LOW (ref 63–78)
Citrated Rapid TEG (MA): 46.5 mm — ABNORMAL LOW (ref 52–70)

## 2019-08-03 LAB — HEPARIN LEVEL (UNFRACTIONATED): Heparin Unfractionated: <0.1 IU/mL — ABNORMAL LOW (ref 0.30–0.70)

## 2019-08-03 LAB — ECHO INTRAOPERATIVE TEE
Height: 69 in
Weight: 2585.6 oz

## 2019-08-03 LAB — PREPARE PLATELET PHERESIS
Unit division: 0
Unit division: 0

## 2019-08-03 LAB — PREPARE RBC (CROSSMATCH)

## 2019-08-03 LAB — HEPATIC FUNCTION PANEL
ALT: 18 U/L (ref 0–44)
AST: 40 U/L (ref 15–41)
Albumin: 2.4 g/dL — ABNORMAL LOW (ref 3.5–5.0)
Alkaline Phosphatase: 32 U/L — ABNORMAL LOW (ref 38–126)
Bilirubin, Direct: 1.2 mg/dL — ABNORMAL HIGH (ref 0.0–0.2)
Indirect Bilirubin: 1.3 mg/dL — ABNORMAL HIGH (ref 0.3–0.9)
Total Bilirubin: 2.5 mg/dL — ABNORMAL HIGH (ref 0.3–1.2)
Total Protein: 3.5 g/dL — ABNORMAL LOW (ref 6.5–8.1)

## 2019-08-03 LAB — LACTIC ACID, PLASMA
Lactic Acid, Venous: 9.1 mmol/L (ref 0.5–1.9)
Lactic Acid, Venous: 8.2 mmol/L (ref 0.5–1.9)
Lactic Acid, Venous: 4.8 mmol/L (ref 0.5–1.9)

## 2019-08-03 LAB — LACTATE DEHYDROGENASE: LDH: 243 U/L — ABNORMAL HIGH (ref 98–192)

## 2019-08-03 LAB — SURGICAL PATHOLOGY

## 2019-08-03 LAB — APTT
aPTT: 43 seconds — ABNORMAL HIGH (ref 24–36)
aPTT: 89 seconds — ABNORMAL HIGH (ref 24–36)

## 2019-08-03 LAB — MASSIVE TRANSFUSION PROTOCOL ORDER (BLOOD BANK NOTIFICATION)

## 2019-08-03 LAB — TRIGLYCERIDES: Triglycerides: 56 mg/dL (ref <150)

## 2019-08-03 SURGERY — CANNULATION FOR ECMO (EXTRACORPOREAL MEMBRANE OXYGENATION)
Anesthesia: General | Site: Chest

## 2019-08-03 SURGERY — DEBRIDEMENT, WOUND
Anesthesia: General | Site: Chest

## 2019-08-03 MED ORDER — CALCIUM CHLORIDE 10 % IV SOLN: 1.0000 g | Freq: Once | INTRAVENOUS | Status: AC

## 2019-08-03 MED ORDER — MILRINONE LACTATE IN DEXTROSE 20-5 MG/100ML-% IV SOLN
0.2500 ug/kg/min | INTRAVENOUS | Status: DC
  Administered 2019-08-03 – 2019-08-05 (×5): 0.25 ug/kg/min via INTRAVENOUS
  Administered 2019-08-06: .25 ug/kg/min via INTRAVENOUS
  Filled 2019-08-03 (×6): qty 100

## 2019-08-03 MED ORDER — 0.9 % SODIUM CHLORIDE (POUR BTL) OPTIME
TOPICAL | Status: DC | PRN
  Administered 2019-08-03: 5000 mL

## 2019-08-03 MED ORDER — AMIODARONE HCL IN DEXTROSE 360-4.14 MG/200ML-% IV SOLN
INTRAVENOUS | Status: AC
  Filled 2019-08-03: qty 200

## 2019-08-03 MED ORDER — SODIUM CHLORIDE 0.9% IV SOLUTION: Freq: Once | INTRAVENOUS | Status: DC

## 2019-08-03 MED ORDER — FENTANYL CITRATE (PF) 250 MCG/5ML IJ SOLN
INTRAMUSCULAR | Status: AC
  Filled 2019-08-03: qty 25

## 2019-08-03 MED ORDER — INSULIN REGULAR(HUMAN) IN NACL 100-0.9 UT/100ML-% IV SOLN
INTRAVENOUS | Status: DC
  Administered 2019-08-03: 9 [IU]/h via INTRAVENOUS
  Administered 2019-08-03: 0.8 [IU]/h via INTRAVENOUS
  Filled 2019-08-03: qty 100

## 2019-08-03 MED ORDER — HEPARIN SODIUM (PORCINE) 1000 UNIT/ML IJ SOLN
INTRAMUSCULAR | Status: DC | PRN
  Administered 2019-08-03: 5000 [IU] via INTRAVENOUS

## 2019-08-03 MED ORDER — SODIUM BICARBONATE 8.4 % IV SOLN: 50.0000 meq | Freq: Once | INTRAVENOUS | Status: AC

## 2019-08-03 MED ORDER — LACTATED RINGERS IV SOLN: INTRAVENOUS | Status: DC | PRN

## 2019-08-03 MED ORDER — POTASSIUM CHLORIDE 10 MEQ/50ML IV SOLN
10.0000 meq | INTRAVENOUS | Status: AC
  Administered 2019-08-03 (×3): 10 meq via INTRAVENOUS

## 2019-08-03 MED ORDER — SODIUM BICARBONATE 8.4 % IV SOLN
INTRAVENOUS | Status: AC
  Administered 2019-08-03: 50 meq via INTRAVENOUS
  Filled 2019-08-03: qty 50

## 2019-08-03 MED ORDER — PLASMA-LYTE 148 IV SOLN
INTRAVENOUS | Status: DC | PRN
  Administered 2019-08-03: 500 mL

## 2019-08-03 MED ORDER — ROCURONIUM BROMIDE 100 MG/10ML IV SOLN
INTRAVENOUS | Status: DC | PRN
  Administered 2019-08-03 (×2): 100 mg via INTRAVENOUS

## 2019-08-03 MED ORDER — CITALOPRAM HYDROBROMIDE 20 MG PO TABS
20.0000 mg | ORAL_TABLET | Freq: Every day | ORAL | Status: DC
  Administered 2019-08-04 – 2019-08-12 (×8): 20 mg
  Filled 2019-08-03 (×9): qty 1

## 2019-08-03 MED ORDER — ALBUMIN HUMAN 5 % IV SOLN
12.5000 g | INTRAVENOUS | Status: DC | PRN
  Administered 2019-08-03 – 2019-08-04 (×3): 12.5 g via INTRAVENOUS
  Filled 2019-08-03 (×2): qty 250

## 2019-08-03 MED ORDER — HEMOSTATIC AGENTS (NO CHARGE) OPTIME
TOPICAL | Status: DC | PRN
  Administered 2019-08-03 (×2): 1 via TOPICAL

## 2019-08-03 MED ORDER — ROCURONIUM BROMIDE 10 MG/ML (PF) SYRINGE
PREFILLED_SYRINGE | INTRAVENOUS | Status: AC
  Filled 2019-08-03: qty 10

## 2019-08-03 MED ORDER — MIDAZOLAM HCL (PF) 10 MG/2ML IJ SOLN
INTRAMUSCULAR | Status: AC
  Filled 2019-08-03: qty 2

## 2019-08-03 MED ORDER — EPINEPHRINE PF 1 MG/ML IJ SOLN
0.5000 ug/min | INTRAVENOUS | Status: DC
  Administered 2019-08-03: 8 ug/min via INTRAVENOUS
  Administered 2019-08-03 – 2019-08-04 (×2): 6 ug/min via INTRAVENOUS
  Filled 2019-08-03 (×4): qty 4

## 2019-08-03 MED ORDER — DOCUSATE SODIUM 50 MG/5ML PO LIQD
200.0000 mg | Freq: Every day | ORAL | Status: DC
  Administered 2019-08-04 – 2019-08-27 (×8): 200 mg
  Filled 2019-08-03 (×10): qty 20

## 2019-08-03 MED ORDER — CALCIUM CHLORIDE 10 % IV SOLN
INTRAVENOUS | Status: AC
  Administered 2019-08-03: 1 g via INTRAVENOUS
  Filled 2019-08-03: qty 10

## 2019-08-03 MED ORDER — NOREPINEPHRINE 16 MG/250ML-% IV SOLN
0.0000 ug/min | INTRAVENOUS | Status: DC
  Administered 2019-08-03: 35 ug/min via INTRAVENOUS
  Administered 2019-08-03: 31 ug/min via INTRAVENOUS
  Administered 2019-08-03: 35 ug/min via INTRAVENOUS
  Administered 2019-08-04: 28 ug/min via INTRAVENOUS
  Administered 2019-08-04 – 2019-08-05 (×2): 18 ug/min via INTRAVENOUS
  Administered 2019-08-06: 13 ug/min via INTRAVENOUS
  Administered 2019-08-06: 33 ug/min via INTRAVENOUS
  Administered 2019-08-07: 46 ug/min via INTRAVENOUS
  Administered 2019-08-07: 36 ug/min via INTRAVENOUS
  Administered 2019-08-08: 20 ug/min via INTRAVENOUS
  Administered 2019-08-08: 26 ug/min via INTRAVENOUS
  Administered 2019-08-08: 19 ug/min via INTRAVENOUS
  Administered 2019-08-09: 14 ug/min via INTRAVENOUS
  Administered 2019-08-09: 28 ug/min via INTRAVENOUS
  Administered 2019-08-10: 11 ug/min via INTRAVENOUS
  Administered 2019-08-10: 8 ug/min via INTRAVENOUS
  Administered 2019-08-11: 16 ug/min via INTRAVENOUS
  Administered 2019-08-12: 36 ug/min via INTRAVENOUS
  Administered 2019-08-12: 26 ug/min via INTRAVENOUS
  Administered 2019-08-13: 24 ug/min via INTRAVENOUS
  Administered 2019-08-13: 25 ug/min via INTRAVENOUS
  Administered 2019-08-14: 26 ug/min via INTRAVENOUS
  Administered 2019-08-14: 23 ug/min via INTRAVENOUS
  Administered 2019-08-15: 32 ug/min via INTRAVENOUS
  Administered 2019-08-15: 26 ug/min via INTRAVENOUS
  Administered 2019-08-15: 30 ug/min via INTRAVENOUS
  Administered 2019-08-16: 22 ug/min via INTRAVENOUS
  Administered 2019-08-17: 25 ug/min via INTRAVENOUS
  Administered 2019-08-18: 34 ug/min via INTRAVENOUS
  Administered 2019-08-19: 10 ug/min via INTRAVENOUS
  Administered 2019-08-20: 9 ug/min via INTRAVENOUS
  Administered 2019-08-21: 24 ug/min via INTRAVENOUS
  Administered 2019-08-21 (×2): 28 ug/min via INTRAVENOUS
  Administered 2019-08-22: 15 ug/min via INTRAVENOUS
  Administered 2019-08-22: 27 ug/min via INTRAVENOUS
  Administered 2019-08-24: 14 ug/min via INTRAVENOUS
  Administered 2019-08-25: 26 ug/min via INTRAVENOUS
  Administered 2019-08-26: 38 ug/min via INTRAVENOUS
  Administered 2019-08-26: 27 ug/min via INTRAVENOUS
  Administered 2019-08-27: 21 ug/min via INTRAVENOUS
  Administered 2019-08-28: 20 ug/min via INTRAVENOUS
  Administered 2019-08-28: 25 ug/min via INTRAVENOUS
  Administered 2019-08-29: 3 ug/min via INTRAVENOUS
  Administered 2019-08-30: 18 ug/min via INTRAVENOUS
  Administered 2019-08-31: 5 ug/min via INTRAVENOUS
  Filled 2019-08-03 (×44): qty 250
  Filled 2019-08-03: qty 750
  Filled 2019-08-03 (×7): qty 250

## 2019-08-03 MED ORDER — DEXTROSE-NACL 5-0.45 % IV SOLN: INTRAVENOUS | Status: DC

## 2019-08-03 MED ORDER — PANTOPRAZOLE SODIUM 40 MG IV SOLR
40.0000 mg | Freq: Every day | INTRAVENOUS | Status: DC
  Administered 2019-08-03 – 2019-08-26 (×24): 40 mg via INTRAVENOUS
  Filled 2019-08-03 (×24): qty 40

## 2019-08-03 MED ORDER — MIDAZOLAM HCL 5 MG/5ML IJ SOLN
INTRAMUSCULAR | Status: DC | PRN
  Administered 2019-08-03: 2 mg via INTRAVENOUS

## 2019-08-03 MED ORDER — ORAL CARE MOUTH RINSE
15.0000 mL | OROMUCOSAL | Status: DC
  Administered 2019-08-03 – 2019-10-09 (×630): 15 mL via OROMUCOSAL

## 2019-08-03 MED ORDER — EPINEPHRINE (ANAPHYLAXIS) 30 MG/30ML IJ SOLN
INTRAVENOUS | Status: DC | PRN
  Administered 2019-08-03: 7 ug/min via INTRAVENOUS

## 2019-08-03 MED ORDER — ARTIFICIAL TEARS OPHTHALMIC OINT
TOPICAL_OINTMENT | Freq: Three times a day (TID) | OPHTHALMIC | Status: DC
  Administered 2019-08-03 – 2019-08-27 (×12): 1 via OPHTHALMIC
  Filled 2019-08-03 (×4): qty 3.5

## 2019-08-03 MED ORDER — EPINEPHRINE 1 MG/10ML IJ SOSY
PREFILLED_SYRINGE | INTRAMUSCULAR | Status: DC | PRN
Start: 2019-08-03 — End: 2019-08-03
  Administered 2019-08-03 (×3): .05 mg via INTRAVENOUS

## 2019-08-03 MED ORDER — SODIUM CHLORIDE 0.9 % IV SOLN: INTRAVENOUS | Status: DC

## 2019-08-03 MED ORDER — 0.9 % SODIUM CHLORIDE (POUR BTL) OPTIME
TOPICAL | Status: DC | PRN
  Administered 2019-08-03: 1000 mL

## 2019-08-03 MED ORDER — SODIUM CHLORIDE 0.9 % IV SOLN
INTRAVENOUS | Status: DC | PRN
Start: 2019-08-03 — End: 2019-08-03

## 2019-08-03 MED ORDER — FENTANYL 2500MCG IN NS 250ML (10MCG/ML) PREMIX INFUSION
0.0000 ug/h | INTRAVENOUS | Status: DC
  Administered 2019-08-03: 100 ug/h via INTRAVENOUS
  Administered 2019-08-04: 125 ug/h via INTRAVENOUS
  Administered 2019-08-05 (×2): 150 ug/h via INTRAVENOUS
  Administered 2019-08-06: 175 ug/h via INTRAVENOUS
  Administered 2019-08-07 (×2): 200 ug/h via INTRAVENOUS
  Administered 2019-08-08: 225 ug/h via INTRAVENOUS
  Administered 2019-08-08: 200 ug/h via INTRAVENOUS
  Administered 2019-08-08 – 2019-08-09 (×3): 250 ug/h via INTRAVENOUS
  Administered 2019-08-10: 225 ug/h via INTRAVENOUS
  Administered 2019-08-10: 250 ug/h via INTRAVENOUS
  Administered 2019-08-11 – 2019-08-12 (×2): 225 ug/h via INTRAVENOUS
  Administered 2019-08-12 – 2019-08-13 (×3): 250 ug/h via INTRAVENOUS
  Administered 2019-08-13: 275 ug/h via INTRAVENOUS
  Administered 2019-08-14 – 2019-08-15 (×4): 225 ug/h via INTRAVENOUS
  Administered 2019-08-17: 250 ug/h via INTRAVENOUS
  Administered 2019-08-17: 175 ug/h via INTRAVENOUS
  Administered 2019-08-18: 250 ug/h via INTRAVENOUS
  Administered 2019-08-19: 200 ug/h via INTRAVENOUS
  Administered 2019-08-19: 150 ug/h via INTRAVENOUS
  Administered 2019-08-20: 125 ug/h via INTRAVENOUS
  Administered 2019-08-20: 150 ug/h via INTRAVENOUS
  Filled 2019-08-03 (×34): qty 250

## 2019-08-03 MED ORDER — DOUBLE ANTIBIOTIC 500-10000 UNIT/GM EX OINT
TOPICAL_OINTMENT | CUTANEOUS | Status: AC
  Filled 2019-08-03: qty 28.4

## 2019-08-03 MED ORDER — FENTANYL CITRATE (PF) 100 MCG/2ML IJ SOLN: 50.0000 ug | Freq: Once | INTRAMUSCULAR | Status: DC

## 2019-08-03 MED ORDER — AMIODARONE HCL IN DEXTROSE 360-4.14 MG/200ML-% IV SOLN
60.0000 mg/h | INTRAVENOUS | Status: DC
  Administered 2019-08-03 – 2019-08-04 (×3): 60 mg/h via INTRAVENOUS
  Filled 2019-08-03: qty 400
  Filled 2019-08-03: qty 200

## 2019-08-03 MED ORDER — PROPOFOL 10 MG/ML IV BOLUS
INTRAVENOUS | Status: AC
  Filled 2019-08-03: qty 20

## 2019-08-03 MED ORDER — CHLORHEXIDINE GLUCONATE 0.12% ORAL RINSE (MEDLINE KIT)
15.0000 mL | Freq: Two times a day (BID) | OROMUCOSAL | Status: DC
  Administered 2019-08-03 – 2019-10-09 (×130): 15 mL via OROMUCOSAL

## 2019-08-03 MED ORDER — HEPARIN (PORCINE) 25000 UT/250ML-% IV SOLN
500.0000 [IU]/h | INTRAVENOUS | Status: DC
  Administered 2019-08-04: 500 [IU]/h via INTRAVENOUS
  Filled 2019-08-03: qty 250

## 2019-08-03 MED ORDER — POTASSIUM CHLORIDE 10 MEQ/50ML IV SOLN
INTRAVENOUS | Status: AC
  Filled 2019-08-03: qty 150

## 2019-08-03 MED ORDER — DEXTROSE 50 % IV SOLN: 0.0000 mL | INTRAVENOUS | Status: DC | PRN

## 2019-08-03 MED ORDER — SODIUM CHLORIDE 0.9 % IV SOLN
30.0000 ug | Freq: Once | INTRAVENOUS | Status: AC
  Administered 2019-08-03: 30 ug via INTRAVENOUS
  Filled 2019-08-03: qty 7.5

## 2019-08-03 MED ORDER — SODIUM BICARBONATE 8.4 % IV SOLN
INTRAVENOUS | Status: DC | PRN
  Administered 2019-08-03 (×3): 50 meq via INTRAVENOUS

## 2019-08-03 MED ORDER — METOCLOPRAMIDE HCL 5 MG/ML IJ SOLN
10.0000 mg | Freq: Four times a day (QID) | INTRAMUSCULAR | Status: DC
  Administered 2019-08-03 – 2019-08-06 (×11): 10 mg via INTRAVENOUS
  Filled 2019-08-03 (×11): qty 2

## 2019-08-03 MED ORDER — VASOPRESSIN 20 UNIT/ML IV SOLN
0.0400 [IU]/min | INTRAVENOUS | Status: DC
  Administered 2019-08-06: .03 [IU]/min via INTRAVENOUS
  Administered 2019-08-06: 0.05 [IU]/min via INTRAVENOUS
  Administered 2019-08-07 – 2019-08-08 (×2): 0.03 [IU]/min via INTRAVENOUS
  Administered 2019-08-09: 0.04 [IU]/min via INTRAVENOUS
  Administered 2019-08-09: 0.03 [IU]/min via INTRAVENOUS
  Administered 2019-08-10 – 2019-08-14 (×7): 0.04 [IU]/min via INTRAVENOUS
  Filled 2019-08-03 (×13): qty 2

## 2019-08-03 MED ORDER — ALBUMIN HUMAN 5 % IV SOLN: 12.5000 g | INTRAVENOUS | Status: DC | PRN

## 2019-08-03 MED ORDER — ROSUVASTATIN CALCIUM 20 MG PO TABS
20.0000 mg | ORAL_TABLET | Freq: Every day | ORAL | Status: DC
  Administered 2019-08-04 – 2019-08-12 (×8): 20 mg
  Filled 2019-08-03 (×9): qty 1

## 2019-08-03 MED ORDER — EPINEPHRINE HCL 5 MG/250ML IV SOLN IN NS: INTRAVENOUS | Status: DC | PRN

## 2019-08-03 MED ORDER — SODIUM CHLORIDE 0.9 % IV SOLN: 1.0000 g | Freq: Once | INTRAVENOUS | Status: DC

## 2019-08-03 MED ORDER — AMIODARONE HCL IN DEXTROSE 360-4.14 MG/200ML-% IV SOLN
INTRAVENOUS | Status: DC | PRN
Start: 2019-08-03 — End: 2019-08-03
  Administered 2019-08-03: 60 mg/h via INTRAVENOUS

## 2019-08-03 MED ORDER — VASOPRESSIN 20 UNIT/ML IV SOLN
INTRAVENOUS | Status: AC
  Filled 2019-08-03: qty 1

## 2019-08-03 MED ORDER — AMIODARONE HCL IN DEXTROSE 360-4.14 MG/200ML-% IV SOLN
60.0000 mg/h | INTRAVENOUS | Status: DC
  Administered 2019-08-03: 60 mg/h via INTRAVENOUS

## 2019-08-03 MED ORDER — TRANEXAMIC ACID 1000 MG/10ML IV SOLN
1.5000 mg/kg/h | INTRAVENOUS | Status: AC
  Administered 2019-08-03: 1.5 mg/kg/h via INTRAVENOUS
  Filled 2019-08-03: qty 25

## 2019-08-03 MED ORDER — HEPARIN (PORCINE) 25000 UT/250ML-% IV SOLN
800.0000 [IU]/h | INTRAVENOUS | Status: DC
  Administered 2019-08-03: 800 [IU]/h via INTRAVENOUS

## 2019-08-03 MED ORDER — FENTANYL BOLUS VIA INFUSION
50.0000 ug | INTRAVENOUS | Status: DC | PRN
  Administered 2019-08-05 – 2019-08-19 (×11): 50 ug via INTRAVENOUS
  Filled 2019-08-03: qty 50

## 2019-08-03 MED ORDER — PROPOFOL 1000 MG/100ML IV EMUL
5.0000 ug/kg/min | INTRAVENOUS | Status: DC
  Administered 2019-08-03 (×2): 20 ug/kg/min via INTRAVENOUS
  Administered 2019-08-04: 30 ug/kg/min via INTRAVENOUS
  Administered 2019-08-04: 35.016 ug/kg/min via INTRAVENOUS
  Administered 2019-08-04: 35 ug/kg/min via INTRAVENOUS
  Administered 2019-08-05 – 2019-08-06 (×4): 30 ug/kg/min via INTRAVENOUS
  Administered 2019-08-06: 40 ug/kg/min via INTRAVENOUS
  Administered 2019-08-06: 35 ug/kg/min via INTRAVENOUS
  Administered 2019-08-07: 30 ug/kg/min via INTRAVENOUS
  Administered 2019-08-07: 25 ug/kg/min via INTRAVENOUS
  Administered 2019-08-07: 50 ug/kg/min via INTRAVENOUS
  Administered 2019-08-07: 20 ug/kg/min via INTRAVENOUS
  Administered 2019-08-08: 50 ug/kg/min via INTRAVENOUS
  Administered 2019-08-08 (×5): 80 ug/kg/min via INTRAVENOUS
  Administered 2019-08-09 (×2): 60 ug/kg/min via INTRAVENOUS
  Administered 2019-08-09: 60.027 ug/kg/min via INTRAVENOUS
  Administered 2019-08-09: 60 ug/kg/min via INTRAVENOUS
  Administered 2019-08-09: 80 ug/kg/min via INTRAVENOUS
  Administered 2019-08-10: 25 ug/kg/min via INTRAVENOUS
  Administered 2019-08-10: 60 ug/kg/min via INTRAVENOUS
  Administered 2019-08-10: 50 ug/kg/min via INTRAVENOUS
  Administered 2019-08-10: 25 ug/kg/min via INTRAVENOUS
  Administered 2019-08-11 (×2): 30 ug/kg/min via INTRAVENOUS
  Administered 2019-08-11: 35 ug/kg/min via INTRAVENOUS
  Administered 2019-08-12: 30 ug/kg/min via INTRAVENOUS
  Administered 2019-08-12 – 2019-08-13 (×3): 35 ug/kg/min via INTRAVENOUS
  Administered 2019-08-13: 45 ug/kg/min via INTRAVENOUS
  Administered 2019-08-13: 50 ug/kg/min via INTRAVENOUS
  Filled 2019-08-03 (×14): qty 100
  Filled 2019-08-03: qty 200
  Filled 2019-08-03 (×2): qty 100
  Filled 2019-08-03: qty 200
  Filled 2019-08-03 (×12): qty 100
  Filled 2019-08-03: qty 200
  Filled 2019-08-03 (×9): qty 100

## 2019-08-03 MED ORDER — ALBUMIN HUMAN 5 % IV SOLN
INTRAVENOUS | Status: DC | PRN
Start: 2019-08-03 — End: 2019-08-03

## 2019-08-03 MED FILL — Medication: Qty: 1 | Status: AC

## 2019-08-03 SURGICAL SUPPLY — 118 items
ANCHOR CATH FOLEY SECURE (MISCELLANEOUS) ×9 IMPLANT
BAG DECANTER FOR FLEXI CONT (MISCELLANEOUS) ×3 IMPLANT
BANDAGE ESMARK 6X9 LF (GAUZE/BANDAGES/DRESSINGS) ×1 IMPLANT
BLADE CLIPPER SURG (BLADE) IMPLANT
BLADE STERNUM SYSTEM 6 (BLADE) IMPLANT
BNDG ELASTIC 4X5.8 VLCR STR LF (GAUZE/BANDAGES/DRESSINGS) IMPLANT
BNDG ELASTIC 6X5.8 VLCR STR LF (GAUZE/BANDAGES/DRESSINGS) IMPLANT
BNDG ESMARK 6X9 LF (GAUZE/BANDAGES/DRESSINGS) ×3
BNDG GAUZE ELAST 4 BULKY (GAUZE/BANDAGES/DRESSINGS) IMPLANT
CANISTER SUCT 3000ML PPV (MISCELLANEOUS) ×3 IMPLANT
CANNULA EZ GLIDE AORTIC 21FR (CANNULA) ×3 IMPLANT
CANNULA FEM BIOMEDICUS 25FR (CANNULA) ×3 IMPLANT
CANNULA GUNDRY RCSP 15FR (MISCELLANEOUS) IMPLANT
CATH CPB KIT OWEN (MISCELLANEOUS) IMPLANT
CATH HEART VENT LEFT (CATHETERS) ×1 IMPLANT
CATH ROBINSON RED A/P 18FR (CATHETERS) ×6 IMPLANT
CATH THORACIC 36FR (CATHETERS) IMPLANT
CLIP LIGATION XL DS (CLIP) ×3 IMPLANT
CLIP RETRACTION 3.0MM CORONARY (MISCELLANEOUS) IMPLANT
CLIP VESOCCLUDE MED 24/CT (CLIP) IMPLANT
CLIP VESOCCLUDE SM WIDE 24/CT (CLIP) IMPLANT
DRAIN CHANNEL 32F RND 10.7 FF (WOUND CARE) IMPLANT
DRAPE CARDIOVASCULAR INCISE (DRAPES) ×2
DRAPE INCISE IOBAN 66X45 STRL (DRAPES) ×6 IMPLANT
DRAPE PERI GROIN 82X75IN TIB (DRAPES) ×3 IMPLANT
DRAPE SLUSH/WARMER DISC (DRAPES) ×3 IMPLANT
DRAPE SRG 135X102X78XABS (DRAPES) ×1 IMPLANT
DRSG AQUACEL AG ADV 3.5X14 (GAUZE/BANDAGES/DRESSINGS) IMPLANT
DRSG PAD ABDOMINAL 8X10 ST (GAUZE/BANDAGES/DRESSINGS) ×6 IMPLANT
ELECT BLADE 4.0 EZ CLEAN MEGAD (MISCELLANEOUS) ×3
ELECT REM PT RETURN 9FT ADLT (ELECTROSURGICAL) ×6
ELECTRODE BLDE 4.0 EZ CLN MEGD (MISCELLANEOUS) ×1 IMPLANT
ELECTRODE REM PT RTRN 9FT ADLT (ELECTROSURGICAL) ×2 IMPLANT
FELT TEFLON 1X6 (MISCELLANEOUS) ×6 IMPLANT
GAUZE SPONGE 4X4 12PLY STRL (GAUZE/BANDAGES/DRESSINGS) ×3 IMPLANT
GAUZE SPONGE 4X4 12PLY STRL LF (GAUZE/BANDAGES/DRESSINGS) ×3 IMPLANT
GLOVE BIO SURGEON STRL SZ7 (GLOVE) ×3 IMPLANT
GLOVE BIO SURGEON STRL SZ7.5 (GLOVE) ×6 IMPLANT
GLOVE BIOGEL PI IND STRL 6 (GLOVE) ×1 IMPLANT
GLOVE BIOGEL PI IND STRL 6.5 (GLOVE) ×2 IMPLANT
GLOVE BIOGEL PI IND STRL 7.0 (GLOVE) ×1 IMPLANT
GLOVE BIOGEL PI INDICATOR 6 (GLOVE) ×2
GLOVE BIOGEL PI INDICATOR 6.5 (GLOVE) ×4
GLOVE BIOGEL PI INDICATOR 7.0 (GLOVE) ×2
GLOVE ORTHO TXT STRL SZ7.5 (GLOVE) ×6 IMPLANT
GOWN STRL REUS W/ TWL LRG LVL3 (GOWN DISPOSABLE) ×4 IMPLANT
GOWN STRL REUS W/ TWL XL LVL3 (GOWN DISPOSABLE) ×1 IMPLANT
GOWN STRL REUS W/TWL LRG LVL3 (GOWN DISPOSABLE) ×8
GOWN STRL REUS W/TWL XL LVL3 (GOWN DISPOSABLE) ×2
HEMOSTAT POWDER SURGIFOAM 1G (HEMOSTASIS) IMPLANT
INSERT FOGARTY XLG (MISCELLANEOUS) ×3 IMPLANT
KIT BASIN OR (CUSTOM PROCEDURE TRAY) ×3 IMPLANT
KIT DILATOR VASC 18G NDL (KITS) ×3 IMPLANT
KIT SUCTION CATH 14FR (SUCTIONS) ×9 IMPLANT
KIT TURNOVER KIT B (KITS) ×3 IMPLANT
KIT VASOVIEW HEMOPRO 2 VH 4000 (KITS) IMPLANT
LEAD PACING MYOCARDI (MISCELLANEOUS) IMPLANT
MARKER GRAFT CORONARY BYPASS (MISCELLANEOUS) IMPLANT
NS IRRIG 1000ML POUR BTL (IV SOLUTION) ×15 IMPLANT
PACK E OPEN HEART (SUTURE) ×3 IMPLANT
PACK OPEN HEART (CUSTOM PROCEDURE TRAY) ×3 IMPLANT
PAD ARMBOARD 7.5X6 YLW CONV (MISCELLANEOUS) ×6 IMPLANT
PAD ELECT DEFIB RADIOL ZOLL (MISCELLANEOUS) ×3 IMPLANT
PENCIL BUTTON HOLSTER BLD 10FT (ELECTRODE) ×3 IMPLANT
POSITIONER HEAD DONUT 9IN (MISCELLANEOUS) ×3 IMPLANT
POWDER SURGICEL 3.0 GRAM (HEMOSTASIS) ×3 IMPLANT
PUNCH AORTIC ROTATE 4.0MM (MISCELLANEOUS) IMPLANT
PUNCH AORTIC ROTATE 4.5MM 8IN (MISCELLANEOUS) IMPLANT
PUNCH AORTIC ROTATE 5MM 8IN (MISCELLANEOUS) IMPLANT
SEALANT PATCH FIBRIN 2X4IN (MISCELLANEOUS) ×3 IMPLANT
SOL ANTI FOG 6CC (MISCELLANEOUS) IMPLANT
SOLUTION ANTI FOG 6CC (MISCELLANEOUS)
SPONGE LAP 18X18 RF (DISPOSABLE) IMPLANT
SPONGE LAP 4X18 RFD (DISPOSABLE) IMPLANT
SUPPORT HEART JANKE-BARRON (MISCELLANEOUS) IMPLANT
SUT BONE WAX W31G (SUTURE) ×3 IMPLANT
SUT ETHIBOND X763 2 0 SH 1 (SUTURE) ×12 IMPLANT
SUT MNCRL AB 3-0 PS2 18 (SUTURE) IMPLANT
SUT MNCRL AB 4-0 PS2 18 (SUTURE) IMPLANT
SUT PDS AB 1 CTX 36 (SUTURE) IMPLANT
SUT PROLENE 2 0 MH 48 (SUTURE) ×12 IMPLANT
SUT PROLENE 2 0 SH DA (SUTURE) IMPLANT
SUT PROLENE 3 0 SH DA (SUTURE) ×9 IMPLANT
SUT PROLENE 3 0 SH1 36 (SUTURE) ×6 IMPLANT
SUT PROLENE 4 0 RB 1 (SUTURE) ×4
SUT PROLENE 4 0 SH DA (SUTURE) IMPLANT
SUT PROLENE 4-0 RB1 .5 CRCL 36 (SUTURE) ×2 IMPLANT
SUT PROLENE 5 0 C 1 36 (SUTURE) IMPLANT
SUT PROLENE 6 0 C 1 30 (SUTURE) IMPLANT
SUT PROLENE 7.0 RB 3 (SUTURE) IMPLANT
SUT PROLENE 8 0 BV175 6 (SUTURE) IMPLANT
SUT PROLENE BLUE 7 0 (SUTURE) ×3 IMPLANT
SUT PROLENE POLY MONO (SUTURE) IMPLANT
SUT SILK  1 MH (SUTURE) ×6
SUT SILK 1 MH (SUTURE) ×3 IMPLANT
SUT STEEL 6MS V (SUTURE) IMPLANT
SUT STEEL STERNAL CCS#1 18IN (SUTURE) IMPLANT
SUT STEEL SZ 6 DBL 3X14 BALL (SUTURE) IMPLANT
SUT VIC AB 1 CTX 36 (SUTURE)
SUT VIC AB 1 CTX36XBRD ANBCTR (SUTURE) IMPLANT
SUT VIC AB 2-0 CT1 27 (SUTURE)
SUT VIC AB 2-0 CT1 TAPERPNT 27 (SUTURE) IMPLANT
SUT VIC AB 2-0 CTX 27 (SUTURE) IMPLANT
SUT VIC AB 3-0 SH 27 (SUTURE) ×2
SUT VIC AB 3-0 SH 27X BRD (SUTURE) ×1 IMPLANT
SUT VIC AB 3-0 X1 27 (SUTURE) IMPLANT
SUT VICRYL 4-0 PS2 18IN ABS (SUTURE) IMPLANT
SYSTEM SAHARA CHEST DRAIN ATS (WOUND CARE) ×3 IMPLANT
TAPE PAPER 2X10 WHT MICROPORE (GAUZE/BANDAGES/DRESSINGS) ×3 IMPLANT
TOWEL GREEN STERILE (TOWEL DISPOSABLE) ×3 IMPLANT
TOWEL GREEN STERILE FF (TOWEL DISPOSABLE) ×3 IMPLANT
TRAY FOLEY SLVR 16FR TEMP STAT (SET/KITS/TRAYS/PACK) IMPLANT
TUBE CONNECTING 12'X1/4 (SUCTIONS) ×1
TUBE CONNECTING 12X1/4 (SUCTIONS) ×2 IMPLANT
TUBING LAP HI FLOW INSUFFLATIO (TUBING) IMPLANT
UNDERPAD 30X36 HEAVY ABSORB (UNDERPADS AND DIAPERS) ×3 IMPLANT
VENT LEFT HEART 12002 (CATHETERS) ×3
WATER STERILE IRR 1000ML POUR (IV SOLUTION) ×6 IMPLANT

## 2019-08-03 SURGICAL SUPPLY — 44 items
BANDAGE ESMARK 6X9 LF (GAUZE/BANDAGES/DRESSINGS) ×1 IMPLANT
BASIN GRAD PLASTIC 32OZ STRL (MISCELLANEOUS) ×3 IMPLANT
BIOPATCH RED 1 DISK 7.0 (GAUZE/BANDAGES/DRESSINGS) ×2 IMPLANT
BIOPATCH RED 1IN DISK 7.0MM (GAUZE/BANDAGES/DRESSINGS) ×1
BNDG ESMARK 6X9 LF (GAUZE/BANDAGES/DRESSINGS) ×3
CLIP VESOCCLUDE LG 6/CT (CLIP) ×3 IMPLANT
COUNTER NEEDLE 20 DBL MAG RED (NEEDLE) ×3 IMPLANT
COVER BACK TABLE 60X90IN (DRAPES) ×3 IMPLANT
DRAPE CHEST BREAST 15X10 FENES (DRAPES) ×3 IMPLANT
DRAPE INCISE IOBAN 66X45 STRL (DRAPES) ×6 IMPLANT
FELT TEFLON 1X6 (MISCELLANEOUS) ×3 IMPLANT
GAUZE SPONGE 4X4 12PLY STRL (GAUZE/BANDAGES/DRESSINGS) ×3 IMPLANT
GAUZE SPONGE 4X4 12PLY STRL LF (GAUZE/BANDAGES/DRESSINGS) ×3 IMPLANT
GLOVE BIO SURGEON STRL SZ 6 (GLOVE) ×3 IMPLANT
GLOVE BIO SURGEON STRL SZ 6.5 (GLOVE) ×2 IMPLANT
GLOVE BIO SURGEON STRL SZ7.5 (GLOVE) ×6 IMPLANT
GLOVE BIO SURGEONS STRL SZ 6.5 (GLOVE) ×1
GOWN STRL REUS W/ TWL LRG LVL3 (GOWN DISPOSABLE) ×2 IMPLANT
GOWN STRL REUS W/ TWL XL LVL3 (GOWN DISPOSABLE) ×1 IMPLANT
GOWN STRL REUS W/TWL LRG LVL3 (GOWN DISPOSABLE) ×4
GOWN STRL REUS W/TWL XL LVL3 (GOWN DISPOSABLE) ×2
KIT SUCTION CATH 14FR (SUCTIONS) ×3 IMPLANT
NS IRRIG 1000ML POUR BTL (IV SOLUTION) ×3 IMPLANT
POWDER SURGICEL 3.0 GRAM (HEMOSTASIS) ×3 IMPLANT
SEALANT PATCH FIBRIN 2X4IN (MISCELLANEOUS) ×3 IMPLANT
SPONGE LAP 18X18 RF (DISPOSABLE) ×6 IMPLANT
SUT ETHIBOND 2 0 SH (SUTURE) ×2
SUT ETHIBOND 2 0 SH 36X2 (SUTURE) ×1 IMPLANT
SUT PROLENE 2 0 MH 48 (SUTURE) ×9 IMPLANT
SUT PROLENE 3 0 SH DA (SUTURE) ×6 IMPLANT
SUT PROLENE 4 0 RB 1 (SUTURE) ×4
SUT PROLENE 4 0 SH DA (SUTURE) ×3 IMPLANT
SUT PROLENE 4-0 RB1 .5 CRCL 36 (SUTURE) ×1 IMPLANT
SUT PROLENE 4-0 RB1 18X2 ARM (SUTURE) ×1 IMPLANT
SUT SILK  1 MH (SUTURE) ×10
SUT SILK 1 MH (SUTURE) ×5 IMPLANT
SUT SILK 1 TIES 10X30 (SUTURE) ×3 IMPLANT
SUT SILK 2 0 SH CR/8 (SUTURE) ×3 IMPLANT
SYR BULB IRRIG 60ML STRL (SYRINGE) ×3 IMPLANT
SYSTEM SAHARA CHEST DRAIN ATS (WOUND CARE) ×3 IMPLANT
TOWEL GREEN STERILE FF (TOWEL DISPOSABLE) ×3 IMPLANT
TUBE CONNECTING 12'X1/4 (SUCTIONS) ×1
TUBE CONNECTING 12X1/4 (SUCTIONS) ×2 IMPLANT
YANKAUER SUCT BULB TIP NO VENT (SUCTIONS) ×3 IMPLANT

## 2019-08-03 NOTE — Progress Notes (Addendum)
Nutrition Follow-up  DOCUMENTATION CODES:   Not applicable  INTERVENTION:   Tube feeding:  -Pivot 1.5 @ 20 ml/hr via OG -Increase by 10 ml Q8 hours to goal rate of 50 ml/hr (1200 ml) -30 ml Prostat BID  At goal rate TF provides: 2000 kcal (2232 kcal with propofol), 143 grams protein, 900 ml free water.   NUTRITION DIAGNOSIS:   Increased nutrient needs related to post-op healing as evidenced by estimated needs.  Ongoing  GOAL:   Patient will meet greater than or equal to 90% of their needs  Not meeting  MONITOR:   PO intake, Supplement acceptance, Weight trends, Labs, I & O's  REASON FOR ASSESSMENT:   Consult  (EMCO tube feeding recommendations)  ASSESSMENT:   Patient with PMH significant for CHF, COPD, HTN, DM, HLD, MDD, and BPH. Presents this admission with CHF exacerbation.   6/24- s/p CABG x4 6/25- VT arrest, emergent sternotomy, VA ECMO cannulation   Pt discussed during ICU rounds and with RN.   Pt with continued bleeding after trip to OR requiring subsequent mediastinal exploration. Plan to hold off on feeding for today. Xray confirmed OG in stomach. See recommendations above.   Admission weight: 77 kg  Current weight: 73.3 kg   Patient is currently intubated on ventilator support MV: 8.7 L/min Temp (24hrs), Avg:97.8 F (36.6 C), Min:95.9 F (35.5 C), Max:98.8 F (37.1 C)  MAP: 73-88  Propofol: 8.8 ml/hr- provides 232 kcal from lipids daily   I/O: -1,650 ml since admit  UOP: 2,305 ml x 24 hrs  Chest tubes: 2,510 ml x 24 hrs EBL: 984 ml  Drips: NS @ 75 ml/hr, precedex, epinephrine, insulin, milrinone, levophed, propofol, albumin Medications: dulcolax, colace, 10 mg reglan QID Labs: K 3.4 CBG 156-248  NUTRITION - FOCUSED PHYSICAL EXAM:    Most Recent Value  Orbital Region No depletion  Upper Arm Region Mild depletion  Thoracic and Lumbar Region Unable to assess  Buccal Region Unable to assess  Temple Region Mild depletion  Clavicle Bone  Region Mild depletion  Clavicle and Acromion Bone Region Mild depletion  Scapular Bone Region Unable to assess  Dorsal Hand No depletion  Patellar Region Mild depletion  Anterior Thigh Region Mild depletion  Posterior Calf Region Mild depletion  Edema (RD Assessment) Mild  Hair Reviewed  Eyes Unable to assess  Mouth Unable to assess  Skin Reviewed  Nails Reviewed     Diet Order:   Diet Order            Diet NPO time specified  Diet effective now                 EDUCATION NEEDS:   Education needs have been addressed  Skin:  Skin Assessment: Skin Integrity Issues: Skin Integrity Issues:: Incisions, Other (Comment) Incisions: R leg, R groin Other: open chest  Last BM:  6/20  Height:   Ht Readings from Last 1 Encounters:  08/01/19 5\' 9"  (1.753 m)    Weight:   Wt Readings from Last 1 Encounters:  08/02/19 73.3 kg    BMI:  Body mass index is 23.86 kg/m.  Estimated Nutritional Needs:   Kcal:  1820-2184 kcal  Protein:  110-146  Fluid:  >/= 1.8 L/day   08/04/19 RD, LDN Clinical Nutrition Pager listed in AMION

## 2019-08-03 NOTE — Progress Notes (Signed)
Patient had a chest tube output of 300 mL in the last hour. The blood was noted to be dark red. Patient also has a 42 beat run of conscious vtach. MD Cornelius Moras paged and notified. No new orders. Will continue to monitor.

## 2019-08-03 NOTE — Interval H&P Note (Signed)
History and Physical Interval Note:   At 6:30 AM [name] had a sudden VT/VF cardiac arrest. CPR and ACLS initiated immediately. Dr. Cornelius Moras was called and came to bedside. Opened chest emergently for cardiac massage and defibrillation. Had ROSC. Taken emergently to OR for further management with plan for placement of ventricular assistance.  Emergent procedure unable to delay to obtain consent. Nursing staff notified family. 08/03/2019 1:52 PM  Todd [name]  has presented today for surgery, with the diagnosis of emergency heart.  The various methods of treatment have been discussed with the patient and family. After consideration of risks, benefits and other options for treatment, the patient has consented to  Procedure(s): CANNULATION FOR ECMO (EXTRACORPOREAL MEMBRANE OXYGENATION) (N/A) as a surgical intervention.  The patient's history has been reviewed, patient examined, no change in status, stable for surgery.  I have reviewed the patient's chart and labs.  Questions were answered to the patient's satisfaction.     Todd [name]

## 2019-08-03 NOTE — Progress Notes (Signed)
RT assessed pt for readiness to wean per heart wean protocol. Pt is able to perform all task when asked to do so without difficulty. Pt placed in first phase w/settings of SIMV/PRVC/PSV 560/4/40%/+5/10 PS. RT will continue to monitor.

## 2019-08-03 NOTE — Consult Note (Signed)
NAME:  Todd [name], MRN:  [id number], DOB:  07-09-1955, LOS: 8 ADMISSION DATE:  07/26/2019, CONSULTATION DATE:  08/03/19 REFERRING MD:  Roxan Hockey, CHIEF COMPLAINT:  ECMO   Brief History   VA ECMO for post CABG Vtach arrest  History of present illness   Presented with worsening dyspnea 6/17 c/w CHF exacerbation Cath with severe triple vessel disease Underwent CABG 6/24 Early this AM Vtach arrest Chest opened bedside and cardiac massage initiated as well as multiple cardioversions, amiodarone, bicarb etc Brought to OR and cannulated for VA ECMO PCCM consulted to assist with management Comorbidities include DM, heavy smoking, COPD  Past Medical History  Depression Ischemic cardiomyopathy HTN [facility] Events   6/24 CABG 6/25 VA cannulation  Consults:  CHF, PCCM, TCTS  Procedures:  See below  Significant Diagnostic Tests:  6/22 spirometry with restrictive physiology, preserved FEV1/FVC CXR today with bilateral airspace disease, cannulas in good position, deep left sulcus that was present yesterday, no clear PTX  Micro Data:  COVID and HIV Neg  Antimicrobials:  Vanc>> Cefuroxime>>   Interim history/subjective:  Consulted  Objective   Blood pressure 100/68, pulse 97, temperature (!) 96.3 F (35.7 C), resp. rate 12, height 5\' 9"  (1.753 m), weight 73.3 kg, SpO2 100 %. PAP: (21-59)/(13-34) 46/23 CO:  [4.5 L/min-6.9 L/min] 4.7 L/min CI:  [2.4 L/min/m2-3.7 L/min/m2] 2.5 L/min/m2  Vent Mode: PRVC FiO2 (%):  [40 %-50 %] 50 % Set Rate:  [4 bmp-12 bmp] 12 bmp Vt Set:  [550 mL-560 mL] 560 mL PEEP:  [5 cmH20] 5 cmH20 Pressure Support:  [10 cmH20] 10 cmH20 Plateau Pressure:  [15 cmH20-22 cmH20] 22 cmH20   Intake/Output Summary (Last 24 hours) at 08/03/2019 0958 Last data filed at 08/03/2019 0933 Gross per 24 hour  Intake 11954.75 ml  Output 6449 ml  Net 5505.75 ml   Filed Weights   07/31/19 0443 08/01/19 0500 08/02/19 0419  Weight: 73.5 kg 73.8 kg  73.3 kg    Examination: General: ill appearing man on vent HENT: ett in place, minimal secretions Lungs: rhonci bilaterally Cardiovascular: chest open, ext lukewarm Abdomen: Soft, hypoactive bs Extremities: trace edema Neuro: still paralyzed from surgery GU: foley in place with dark urine Skin: sternotomy site oozing blood  Labs pending  Centrally cannulated arterially, R femoral venous drainage catheter Flows 4LPM, circuit pressures okay Sweep 3, vent waveforms benign Foley, mediastinal drains, OGT in place [facility] Problem list   N/A  Assessment & Plan:  S/P VA ECMO cannulation after Vtach arrest in setting of recent CABG. - Heparin on hold for bleeding issues - Titrate down pressors as able - Monitor circuit pressures - Trend usual indices: lactate, Uop  Hemorrhagic shock - To have repeat mediastinal exploration shortly  On mechanical ventilation for above - Lung protective tidal volumes  Ischemic cardiomyopathy - Management per CHF team  DM with hyperglycemia - Insulin endotool  Best practice:  Diet: NPO Pain/Anxiety/Delirium protocol (if indicated): fentanyl, propofol, precedex VAP protocol (if indicated): in place DVT prophylaxis: SCDs with eventual plan for heparin GI prophylaxis: PPI Glucose control: see above Mobility: BR Code Status: Full Family Communication: per primary Disposition:  ICU  Labs   CBC: Recent Labs  Lab 08/01/19 0227 08/01/19 0227 08/01/19 2331 08/02/19 0818 08/02/19 1205 08/02/19 1219 08/02/19 1505 08/02/19 1549 08/02/19 2145 08/03/19 0329 08/03/19 0420  WBC 7.1  --  6.7  --   --   --   --  16.6* 16.5*  --  13.0*  HGB 13.2   < > 13.7   < > 8.9*   < > 9.5* 10.5*  9.8* 8.0* 8.5* 8.7*  HCT 42.8   < > 43.6   < > 27.9*   < > 28.0* 31.0*  31.0* 26.3* 25.0* 27.0*  MCV 87.2  --  87.4  --   --   --   --  86.4 87.7  --  86.5  PLT 181   < > 198  --  136*  --   --  128* 121*  --  112*   < > = values in this  interval not displayed.    Basic Metabolic Panel: Recent Labs  Lab 07/29/19 0330 07/29/19 0755 07/30/19 0303 07/30/19 1824 07/31/19 0410 07/31/19 0410 08/01/19 0227 08/01/19 0844 08/02/19 0416 08/02/19 0818 08/02/19 1246 08/02/19 1246 08/02/19 1318 08/02/19 1318 08/02/19 1409 08/02/19 1413 08/02/19 1505 08/02/19 1549 08/02/19 2145 08/03/19 0329 08/03/19 0420  NA   < >  --  135   < > 134*   < >  --  136  --    < > 138   < > 139   < > 141   < > 140 140 138 140 136  K   < >  --  4.1   < > 4.4   < >  --  4.5  --    < > 4.3   < > 4.2   < > 3.8   < > 4.5 3.9 4.8 4.8 4.8  CL   < >  --  92*  --  95*   < >  --  94*  --    < > 98  --  97*  --  98  --   --   --  107  --  107  CO2   < >  --  32  --  30  --   --  32  --   --   --   --   --   --   --   --   --   --  23  --  22  GLUCOSE   < >  --  241*  --  178*   < >  --  168*  --    < > 79  --  84  --  76  --   --   --  141*  --  101*  BUN   < >  --  28*  --  20   < >  --  25*  --    < > 26*  --  25*  --  23  --   --   --  21  --  23  CREATININE   < >  --  1.67*  --  1.15   < >  --  1.38*  --    < > 0.90  --  1.00  --  0.90  --   --   --  1.09  --  1.23  CALCIUM   < >  --  8.6*  --  8.6*  --   --  9.0  --   --   --   --   --   --   --   --   --   --  7.9*  --  7.8*  MG   < > 2.1 1.8  --  2.0  --  2.0  --  2.0  --   --   --   --   --   --   --   --   --  2.6*  --  2.4  PHOS  --  4.0  --   --   --   --   --   --   --   --   --   --   --   --   --   --   --   --   --   --   --    < > = values in this interval not displayed.   GFR: Estimated Creatinine Clearance: 59.9 mL/min (by C-G formula based on SCr of 1.23 mg/dL). Recent Labs  Lab 08/01/19 2331 08/02/19 1549 08/02/19 2145 08/03/19 0420  WBC 6.7 16.6* 16.5* 13.0*    Liver Function Tests: No results for input(s): AST, ALT, ALKPHOS, BILITOT, PROT, ALBUMIN in the last 168 hours. No results for input(s): LIPASE, AMYLASE in the last 168 hours. No results for input(s): AMMONIA in  the last 168 hours.  ABG    Component Value Date/Time   PHART 7.378 08/03/2019 0329   PCO2ART 45.8 08/03/2019 0329   PO2ART 130 (H) 08/03/2019 0329   HCO3 27.0 08/03/2019 0329   TCO2 28 08/03/2019 0329   O2SAT 99.0 08/03/2019 0329     Coagulation Profile: Recent Labs  Lab 08/01/19 0844 08/02/19 1549  INR 1.0 1.7*    Cardiac Enzymes: No results for input(s): CKTOTAL, CKMB, CKMBINDEX, TROPONINI in the last 168 hours.  HbA1C: Hgb A1c MFr Bld  Date/Time Value Ref Range Status  07/26/2019 11:30 PM 9.0 (H) 4.8 - 5.6 % Final    Comment:    (NOTE)         Prediabetes: 5.7 - 6.4         Diabetes: >6.4         Glycemic control for adults with diabetes: <7.0     CBG: Recent Labs  Lab 08/02/19 2357 08/03/19 0200 08/03/19 0326 08/03/19 0501 08/03/19 0602  GLUCAP 120* 120* 109* 95 112*    Review of Systems:   Cannot assess  Past Medical History  He,  has a past medical history of Arthritis, CHF (congestive heart failure) (HCC), Depression, Diabetes mellitus without complication (HCC), Hyperlipidemia, and Hypertension.   Surgical History    Past Surgical History:  Procedure Laterality Date  . APPENDECTOMY  1995  . CAROTID ENDARTERECTOMY  2010  . CORONARY ARTERY BYPASS GRAFT N/A 08/02/2019   Procedure: CORONARY ARTERY BYPASS GRAFTING (CABG), ON PUMP, TIMES FOUR, USING LEFT INTERNAL MAMMARY, LEFT RADIAL ARTERY, AND ENDOSCOPICALLY HARVESTED RIGHT GREATER SAPHENOUS VEIN;  Surgeon: [name], MD;  Location: [facility] OR;  Service: Open Heart Surgery;  Laterality: N/A;  . RADIAL ARTERY HARVEST Left 08/02/2019   Procedure: LEFT RADIAL ARTERY HARVEST;  Surgeon: [name], MD;  [facility] OR;  Service: Open Heart Surgery;  Laterality: Left;  . RIGHT/LEFT HEART CATH AND CORONARY ANGIOGRAPHY N/A 07/30/2019   Procedure: RIGHT/LEFT HEART CATH AND CORONARY ANGIOGRAPHY;  Surgeon: Kathleene Hazel, MD;  Location: [facility] INVASIVE CV LAB;  Service: Cardiovascular;   Laterality: N/A;  . TEE WITHOUT CARDIOVERSION N/A 08/02/2019   Procedure: TRANSESOPHAGEAL ECHOCARDIOGRAM (TEE);  Surgeon: [name], MD;  Location: [facility] OR;  Service: Open Heart Surgery;  Laterality: N/A;     Social History   reports that he has been smoking cigarettes. He has been smoking about 2.50 packs per day.  He has never used smokeless tobacco. He reports current drug use. Frequency: 7.00 times per week. Drug: Marijuana.   Family History   His family history includes Heart disease in his mother; Hypertension in his mother.   Allergies Allergies  Allergen Reactions  . Empagliflozin     Other reaction(s): diarrhea  . Other     Other reaction(s): Unknown  . Simvastatin     Other reaction(s): diarrhea  . Sitagliptin     Other reaction(s): diarrhea/abd pain     Home Medications  Prior to Admission medications   Medication Sig Start Date End Date Taking? Authorizing Provider  citalopram (CELEXA) 40 MG tablet Take 40 mg by mouth daily.   Yes [provider]  dapagliflozin propanediol (FARXIGA) 10 MG TABS tablet Take 10 mg by mouth daily.   Yes [provider]  ergocalciferol (VITAMIN D2) 1.25 MG (50000 UT) capsule Take 50,000 Units by mouth 2 (two) times a week.   Yes [provider]  furosemide (LASIX) 20 MG tablet Take 40 mg by mouth daily. 07/16/19  Yes [provider]  glipiZIDE-metformin (METAGLIP) 2.5-500 MG tablet Take 2 tablets by mouth 2 (two) times daily with a meal.   Yes [provider]  ibuprofen (ADVIL) 200 MG tablet Take 400 mg by mouth 2 (two) times daily as needed for moderate pain.   Yes [provider]  levofloxacin (LEVAQUIN) 750 MG tablet Take 750 mg by mouth daily. For 14 days.   Yes [provider]  lisinopril (ZESTRIL) 20 MG tablet Take 20 mg by mouth daily.   Yes [provider]  oxyCODONE (ROXICODONE) 15 MG immediate release tablet Take 15 mg by mouth 3 (three) times daily.    Yes [provider]  pioglitazone (ACTOS) 45 MG tablet Take 45 mg by mouth daily.   Yes [provider]  promethazine (PHENERGAN) 25 MG tablet Take 25 mg by mouth 2 (two) times daily as needed for nausea or vomiting.   Yes [provider]     Critical care time: 45 minutes

## 2019-08-03 NOTE — Progress Notes (Signed)
0910 Pt Transferred from OR 15 to the ICU and reported off to Ecmo Specialist at 0914. VA Ecmo, 21 Fr. Arterial into Aorta, 23/25 multistage Rt. Fem venous cannula. Flows are 4.0 lpm. Pt Stable.

## 2019-08-03 NOTE — Progress Notes (Signed)
Patient in and out of v-tach. At about 0630 patient had sustained vtach. Code blue initiated. See code sheet. MD paged and upon arrival opened the patients chest at the bedside. ROSC obtained. Patient transported to OR.

## 2019-08-03 NOTE — Progress Notes (Signed)
The chaplain relieved the night on-call chaplain and stayed with the family. The patient's family is distressed about the unknown concerning their loved ones health. The chaplain prayed with the family and provided a supportive presence. After the patient comes from the Cath Lab the chaplain is available for further support.  Lavone Neri Chaplain Resident For questions concerning this note please contact me by pager [phone]

## 2019-08-03 NOTE — Progress Notes (Signed)
RT assessed pt for readiness to progress to next phase of wean. Pt able to perform all task without difficulty. Pt placed in CPAP/PSV on 10/5 40% and tolerating well. Pt respiratory status is stable at this time. RT will continue to monitor.

## 2019-08-03 NOTE — Procedures (Signed)
Extubation Procedure Note  Patient Details:   Name: Todd [name] DOB: 07/25/1955 MRN: [id number]   Airway Documentation:    Vent end date: 08/03/19 Vent end time: 0404   Evaluation  O2 sats: stable throughout Complications: No apparent complications Patient did tolerate procedure well. Bilateral Breath Sounds: Clear, Diminished   Yes  Pt able to speak name, no stridor noted, pt had weak cough, pt NIF -24 VC 8L, pt placed on 4 Lpm [place]. Pt respiratory status stable at this time.   Todd [name] 08/03/2019, 4:07 AM

## 2019-08-03 NOTE — OR Nursing (Signed)
Surgeon removed 2 keepers at the start of bedside chest re-exploration and replaced with 3 keepers and 3 hemostats at the completion. Removal of the 6 total foreign objects will occur at a later time as planned by the surgeon.

## 2019-08-03 NOTE — Progress Notes (Signed)
PT Cancellation Note  Patient Details Name: Isiaih Hollenbach MRN: [id number] DOB: 02-06-1956   Cancelled Treatment:    Reason Eval/Treat Not Completed: Patient at procedure or test/unavailable (pt in sx)   Roylene Heaton B Jossette Zirbel 08/03/2019, 8:11 AM Merryl Hacker, PT Acute Rehabilitation Services Pager: [phone] Office: [phone]

## 2019-08-03 NOTE — Anesthesia Postprocedure Evaluation (Signed)
Anesthesia Post Note  Patient: Giuliano Preece  Procedure(s) Performed: CORONARY ARTERY BYPASS GRAFTING (CABG), ON PUMP, TIMES FOUR, USING LEFT INTERNAL MAMMARY, LEFT RADIAL ARTERY, AND ENDOSCOPICALLY HARVESTED RIGHT GREATER SAPHENOUS VEIN (N/A Chest) TRANSESOPHAGEAL ECHOCARDIOGRAM (TEE) (N/A ) LEFT RADIAL ARTERY HARVEST (Left )     Patient location during evaluation: SICU Anesthesia Type: General Level of consciousness: sedated Pain management: pain level controlled Vital Signs Assessment: post-procedure vital signs reviewed and stable Respiratory status: patient remains intubated per anesthesia plan Cardiovascular status: stable Postop Assessment: no apparent nausea or vomiting Anesthetic complications: no   No complications documented.  Last Vitals:  Vitals:   08/03/19 1230 08/03/19 1245  BP:    Pulse: 81 82  Resp: (!) 0 (!) 0  Temp: (!) 36.4 C 36.5 C  SpO2: 100% 100%    Last Pain:  Vitals:   08/03/19 0547  TempSrc:   PainSc: 9                  Candon Caras P Dereka Lueras

## 2019-08-03 NOTE — Progress Notes (Signed)
ANTICOAGULATION CONSULT NOTE - Initial Consult  Pharmacy Consult for heparin  Indication: VA ECMO  Allergies  Allergen Reactions  . Empagliflozin     Other reaction(s): diarrhea  . Other     Other reaction(s): Unknown  . Simvastatin     Other reaction(s): diarrhea  . Sitagliptin     Other reaction(s): diarrhea/abd pain    Patient Measurements: Height: 5\' 9"  (175.3 cm) (per history) Weight: 73.3 kg (161 lb 9.6 oz) IBW/kg (Calculated) : 70.7 Heparin Dosing Weight: 73kg  Vital Signs: Temp: 97.7 F (36.5 C) (06/25 1245) Temp Source: Core (06/25 0400) BP: 113/73 (06/25 1149) Pulse Rate: 82 (06/25 1245)  Labs: Recent Labs    08/02/19 1549 08/02/19 2130 08/02/19 2145 08/02/19 2145 08/03/19 0329 08/03/19 0420 08/03/19 0950 08/03/19 1309  HGB 10.5*  9.8*   < > 8.0*   < > 8.5* 8.7* 7.7*  --   HCT 31.0*  31.0*   < > 26.3*   < > 25.0* 27.0* 23.4*  --   PLT 128*  --  121*   < >  --  112* 71*  70* PENDING  APTT 39*  --   --   --   --   --  98* 44*  LABPROT 19.1*  --   --   --   --   --  21.6* 18.7*  INR 1.7*  --   --   --   --   --  2.0* 1.6*  HEPARINUNFRC  --   --   --   --   --   --   --  <0.10*  CREATININE  --   --  1.09  --   --  1.23 1.38*  --    < > = values in this interval not displayed.    Estimated Creatinine Clearance: 53.4 mL/min (A) (by C-G formula based on SCr of 1.38 mg/dL (H)).   Medical History: Past Medical History:  Diagnosis Date  . Arthritis   . CHF (congestive heart failure) (HCC)   . Depression   . Diabetes mellitus without complication (HCC)   . Hyperlipidemia   . Hypertension    Assessment: 64 year old male s/p CABG on 6/24, subsequently developed VT arrest this am underwent emergency bedside median sternotomy then taken to OR for VA ECMO cannulation. Patient then decompensated again in ICU, concern for ongoing bleeding and patient again underwent bedside sternotomy. Patient currently stable post multiple transfusions.    Anticoagulation currently on hold due to above. Pharmacy to continue to follow progress.   Goal of Therapy:  Heparin level 0.3-0.7 units/ml Monitor platelets by anticoagulation protocol: Yes   Plan:  Continue to follow Start anticoagulation with ok per surgery  7/24 PharmD., BCPS Clinical Pharmacist 08/03/2019 2:19 PM

## 2019-08-03 NOTE — Progress Notes (Addendum)
ANTICOAGULATION CONSULT NOTE - Follow-Up Consult  Pharmacy Consult for heparin  Indication: VA ECMO  Allergies  Allergen Reactions  . Empagliflozin     Other reaction(s): diarrhea  . Other     Other reaction(s): Unknown  . Simvastatin     Other reaction(s): diarrhea  . Sitagliptin     Other reaction(s): diarrhea/abd pain    Patient Measurements: Height: 5\' 9"  (175.3 cm) (per history) Weight: 73.3 kg (161 lb 9.6 oz) IBW/kg (Calculated) : 70.7 Heparin Dosing Weight: 73kg  Vital Signs: Temp: 98.2 F (36.8 C) (06/25 1800) BP: 108/68 (06/25 1531) Pulse Rate: 77 (06/25 1600)  Labs: Recent Labs    08/02/19 1549 08/02/19 2130 08/03/19 0420 08/03/19 0420 08/03/19 0950 08/03/19 0955 08/03/19 1308 08/03/19 1308 08/03/19 1309 08/03/19 1515 08/03/19 1515 08/03/19 1616 08/03/19 1829  HGB 10.5*  9.8*   < > 8.7*   < > 7.7*   < > 10.9*   < >  --  9.2*   < > 9.0* 7.1*  HCT 31.0*  31.0*   < > 27.0*   < > 23.4*   < > 31.7*   < >  --  27.0*  --  26.2* 21.0*  PLT 128*   < > 112*   < > 71*  70*   < > 43*  --  44*  --   --  57*  --   APTT 39*  --   --   --  98*  --   --   --  44*  --   --  43*  --   LABPROT 19.1*  --   --   --  21.6*  --   --   --  18.7*  --   --   --   --   INR 1.7*  --   --   --  2.0*  --   --   --  1.6*  --   --   --   --   HEPARINUNFRC  --   --   --   --   --   --   --   --  <0.10*  --   --   --   --   CREATININE  --    < > 1.23  --  1.38*  --   --   --   --   --   --  1.38*  --    < > = values in this interval not displayed.    Estimated Creatinine Clearance: 53.4 mL/min (A) (by C-G formula based on SCr of 1.38 mg/dL (H)).   Medical History: Past Medical History:  Diagnosis Date  . Arthritis   . CHF (congestive heart failure) (HCC)   . Depression   . Diabetes mellitus without complication (HCC)   . Hyperlipidemia   . Hypertension    Assessment: 64 year old male s/p CABG on 6/24, subsequently developed VT arrest this am underwent emergency bedside  median sternotomy then taken to OR for VA ECMO cannulation. Patient then decompensated again in ICU, concern for ongoing bleeding and patient again underwent bedside sternotomy. Patient currently stable post multiple transfusions.   The patient has remained stable and plans are now to resume Heparin at a low-dose flat rate of 800 units/hr per CVTS ([facility]), no titration for now. Will monitor for bleeding.   Goal of Therapy:  Heparin level 0.3-0.5 units/ml Monitor platelets by anticoagulation protocol: Yes   Plan:  - Start Heparin  at 800 units/hr (8 ml/hr) - No titration for now per CVTS, monitor for bleeding - Will obtain q6h HL for now  Addendum: Significant drainage noted per RN - discussed with [facility] and plan is to hold Heparin for 1 hour and resume at lower rate of 500 units/hr - to restart around midnight.  Alycia Rossetti, PharmD, BCPS  10:53 PM    Thank you for allowing pharmacy to be a part of this patient's care.  Alycia Rossetti, PharmD, BCPS Clinical Pharmacist Clinical phone for 08/03/2019: [phone] 08/03/2019 7:15 PM   **Pharmacist phone directory can now be found on [email or web address] ([email or web address]).  Listed under [place].

## 2019-08-03 NOTE — CV Procedure (Signed)
ECMO INITIATION   Patient: Todd [name], Nov 07, 1955, 64 y.o. Location:   Date of Service:  08/03/2019     Time: 10:13 AM  Date of Admission: 07/26/2019 Admitting diagnosis: Coronary artery disease  Ht: 5\' 9"  (175.3 cm) (per history) Wt: 73.3 kg BSA: Body surface area is 1.89 meters squared.  Blood Type: Conflict (See Lab Report): A POS/A POS Performed at [facility], [place] [address], [place], [place] [address]  Allergies:  Allergies  Allergen Reactions  . Empagliflozin     Other reaction(s): diarrhea  . Other     Other reaction(s): Unknown  . Simvastatin     Other reaction(s): diarrhea  . Sitagliptin     Other reaction(s): diarrhea/abd pain    Past medical history:  Past Medical History:  Diagnosis Date  . Arthritis   . CHF (congestive heart failure) ([place])   . Depression   . Diabetes mellitus without complication ([place])   . Hyperlipidemia   . Hypertension    Past surgical history:  Past Surgical History:  Procedure Laterality Date  . APPENDECTOMY  1995  . CAROTID ENDARTERECTOMY  2010  . CORONARY ARTERY BYPASS GRAFT N/A 08/02/2019   Procedure: CORONARY ARTERY BYPASS GRAFTING (CABG), ON PUMP, TIMES FOUR, USING LEFT INTERNAL MAMMARY, LEFT RADIAL ARTERY, AND ENDOSCOPICALLY HARVESTED RIGHT GREATER SAPHENOUS VEIN;  Surgeon: Melrose Nakayama, MD;  Location: [place];  Service: Open Heart Surgery;  Laterality: N/A;  . RADIAL ARTERY HARVEST Left 08/02/2019   Procedure: LEFT RADIAL ARTERY HARVEST;  Surgeon: Melrose Nakayama, MD;  Location: [place];  Service: Open Heart Surgery;  Laterality: Left;  . RIGHT/LEFT HEART CATH AND CORONARY ANGIOGRAPHY N/A 07/30/2019   Procedure: RIGHT/LEFT HEART CATH AND CORONARY ANGIOGRAPHY;  Surgeon: Burnell Blanks, MD;  Location: [place] CV LAB;  Service: Cardiovascular;  Laterality: N/A;  . TEE WITHOUT CARDIOVERSION N/A 08/02/2019   Procedure: TRANSESOPHAGEAL ECHOCARDIOGRAM (TEE);  Surgeon: Melrose Nakayama, MD;  Location:  [place];  Service: Open Heart Surgery;  Laterality: N/A;    Indication for ECMO: Post-Cardiotomy (Day 1, post CABG)  ECMO was deployed at 0700 and initiated at 0751  Anticoagulation achieved with Heparin bolus of 5000 units given to patient at 0716. Cannulated for ECMO Mode: VA and achieved initial ECMO Flow (LPM): 3.75 and ECMO Sweep Gas (LPM): 3.    ECMO Cannula Information     Staff Present  Primary Perfusionist Saintclair Halsted. Sena Slate, LP  Assisting Perfusionist/ECMO Specialist   Cannulating Physician Dr. Roxan Hockey    ECMO Lot Numbers  CardioHelp Console  #3  Oxygenator  [id number]  Tubing Pack  [id number]  ECMO Goals  Flow goal   Flow Goal: Q > 3 LPM  Anticoagulation goal   Anticoagulation Goal: Hold Heparin for now  Cardiac goal      Respiratory goal   Respiratory Goal: Normalize  Other goal   Other Goal: Maintain pulsatility    ECMO Handoff  Patient Information * Age Height Weight BSA IBW BMI  65 y.o. 5\' 9"  (175.3 cm) (per history)  (73.3 kg Body surface area is 1.89 meters squared. No data recorded Body mass index is 23.86 kg/m.   Review History * Primary Diagnosis   Coronary artery disease  Prior Cardiac Arrest within 24hrs of ECMO initiation? Yes  ECMO and MCS * Type ECMO Flow ECMO Sweep Gases   ECMO Device: Cardiohelp   Flow (LPM): 3.75   Sweep Gas (LPM): 3     Additional Mechanical Support  Ventilation *    $ Ventilator Initial/Subsequent : Initial, Vent Mode: PRVC (Per Dr. Katrinka Blazing.), Vt Set: 560 mL (8 cc's), Set Rate: 12 bmp, FiO2 (%): 50 %, I Time: 0.9 Sec(s), PEEP: 5 cmH20     Cannula Size and Locations       Drainage 25 Fr. Mutlistage RFV   Return 21 Fr. Edwards EasyGlide Aorta   *Cannula(e) sutured and anchored, secured and dressed.   Labs and Imaging *  *Cannulation position verified via imaging on arrival to ICU. Concerns communicated to attending surgeon. Labs reviewed.   All ECMO safety checks complete. ECMO flowsheet initiated,  applicable charges captured, LDA's entered/confirmed, imaging and labs verified, blood products available, and report given to Devota Pace, Charity fundraiser.

## 2019-08-03 NOTE — Progress Notes (Signed)
TCTS Evening Rounds  POD #1 s/p CABG with VA ECMO cann after VT arrest HD stable Ejecting well No signif bleeding Making urine  PE: sedated Ext warm  Labs: pending  CXR clear lung fields  A/p: begin heparin soon Cardiac rest with ECLS. Alassane Kalafut Z. Vickey Sages, MD [phone]

## 2019-08-03 NOTE — Progress Notes (Signed)
1100 am .. Correction. Femoral venous Cannula is a 25 fr. Multi Stage.

## 2019-08-03 NOTE — Progress Notes (Signed)
Emergent blood products given.  Checked against blood band of pt with 2 RN's.    8 PRBC's 6 FFP's   Given prior to and during open heart case and after in attempt to stabilize the pt.    Lonia Blood, RN

## 2019-08-03 NOTE — Anesthesia Postprocedure Evaluation (Signed)
Anesthesia Post Note  Patient: Junie Engram  Procedure(s) Performed: DEBRIDEMENT WOUND (N/A Chest) CANNULATION FOR ECMO (EXTRACORPOREAL MEMBRANE OXYGENATION) (N/A Chest)     Patient location during evaluation: SICU Anesthesia Type: General Level of consciousness: sedated, patient cooperative, responds to stimulation and patient remains intubated per anesthesia plan Pain management: pain level controlled Respiratory status: patient remains intubated per anesthesia plan and patient on ventilator - see flowsheet for VS Cardiovascular status: requiring ECMO, inotropes, pressors. Postop Assessment: no apparent nausea or vomiting Anesthetic complications: no   No complications documented.  Last Vitals:  Vitals:   08/03/19 1600 08/03/19 1700  BP:    Pulse: 77   Resp: (!) 0 (!) 0  Temp: 36.6 C 36.8 C  SpO2: 100% 100%                  Uriel Dowding,E. Daquarius Dubeau

## 2019-08-03 NOTE — Progress Notes (Signed)
VAT: Responded to a code blue event. Patient has good, functional vascular access.

## 2019-08-03 NOTE — Brief Op Note (Signed)
08/03/2019  1:55 PM  PATIENT:  Todd [name]  64 y.o. male  PRE-OPERATIVE DIAGNOSIS:  Cardiac arrest post CABG  POST-OPERATIVE DIAGNOSIS:  Emergency ECMO cannulation  PROCEDURE:  Procedure(s): CANNULATION FOR ECMO (EXTRACORPOREAL MEMBRANE OXYGENATION) (N/A)  SURGEON:  Surgeon(s) and Role:    * [name], MD - Primary    * Purcell Nails, MD  PHYSICIAN ASSISTANT:   ASSISTANTS: none   ANESTHESIA:   general  EBL:  700 mL   BLOOD ADMINISTERED: Multiple PRBC and FFP  DRAINS: 3 Chest Tube(s) in the R, L and mediastinal   LOCAL MEDICATIONS USED:  NONE  SPECIMEN:  No Specimen  DISPOSITION OF SPECIMEN:  N/A  COUNTS:  YES  TOURNIQUET:  * No tourniquets in log *  DICTATION: .Other Dictation: Dictation Number -  PLAN OF CARE: Admit to inpatient   PATIENT DISPOSITION:  ICU - intubated and critically ill.   Delay start of Pharmacological VTE agent (>24hrs) due to surgical blood loss or risk of bleeding: not applicable

## 2019-08-03 NOTE — Progress Notes (Signed)
   08/03/19 0600  Clinical Encounter Type  Visited With Other (Comment)  Visit Type Code   Chaplain responded to Code Blue.  Nurse [place].  Family will take approximately an hour or more to get here.    Chaplain will follow-up.

## 2019-08-03 NOTE — Transfer of Care (Signed)
Immediate Anesthesia Transfer of Care Note  Patient: Todd [name]  Procedure(s) Performed: CORONARY ARTERY BYPASS GRAFTING (CABG) (N/A Chest)  Patient Location: SICU   Anesthesia Type:General  Level of Consciousness: sedated and Patient remains intubated per anesthesia plan  Airway & Oxygen Therapy: Patient remains intubated per anesthesia plan and Patient placed on Ventilator (see vital sign flow sheet for setting)   Post-op Assessment: Report given to RN and Post -op Vital signs reviewed and stable  Post vital signs: Reviewed and stable  Last Vitals:  Vitals Value Taken Time  BP    Temp    Pulse    Resp    SpO2      Last Pain:  Vitals:   08/03/19 0547  TempSrc:   PainSc: 9       Patients Stated Pain Goal: 0 (07/31/19 2124)  Complications: No complications documented.

## 2019-08-03 NOTE — Anesthesia Preprocedure Evaluation (Addendum)
Anesthesia Evaluation  Patient identified by MRN, date of birth, ID band Patient unresponsive    Reviewed: Patient's Chart, lab work & pertinent test resultsPreop documentation limited or incomplete due to emergent nature of procedure.  History of Anesthesia Complications Negative for: history of anesthetic complications  Airway Mallampati: Intubated       Dental   Pulmonary Current Smoker,       + intubated    Cardiovascular hypertension, Pt. on medications + CAD (Severe triple vessel CAD with CTO of the proximal RCA and mid LAD. High grade disease in the Circumflex and OM)   Rhythm:Irregular Rate:Abnormal  Pt post-op CABG yesterday, now Emergent return to OR for Vfib arrest: chest opened in ICU, now for ECMO with cardiac massage in process  07/30/2019 ECHO: akinesis, hyperechogenicity of the entire inferior septum and inferior wall (consistent with scar in the distribution of the right coronary artery). There is severe hypokinesis of the apex and mid-apical anterior septum, consistent with severe ischemia or infarction in the distribution of the mid-LAD artery. No intracavitary thrombus is seen. EF 20 to 25%. The left ventricle has severely decreased function. The left ventricular internal cavity size was mildly dilated. Mild-mod MR   Neuro/Psych    GI/Hepatic (+)     substance abuse  marijuana use,   Endo/Other  diabetes (glu 112), Oral Hypoglycemic Agents  Renal/GU Renal InsufficiencyRenal disease     Musculoskeletal   Abdominal (+) - obese,   Peds  Hematology  (+) Blood dyscrasia (Hb 8.7, plt 112k), anemia ,   Anesthesia Other Findings Vasopressin, Epi, milrinone, Levophed, Neo infusions  Reproductive/Obstetrics                            Anesthesia Physical Anesthesia Plan  ASA: V and emergent  Anesthesia Plan: General   Post-op Pain Management:    Induction: Intravenous  PONV  Risk Score and Plan: 1 and Treatment may vary due to age or medical condition  Airway Management Planned: Oral ETT  Additional Equipment: Arterial line, TEE and PA Cath  Intra-op Plan:   Post-operative Plan: Post-operative intubation/ventilation  Informed Consent:     Only emergency history available  Plan Discussed with: CRNA and Surgeon  Anesthesia Plan Comments:        Anesthesia Quick Evaluation

## 2019-08-03 NOTE — Progress Notes (Addendum)
Patient ID: Todd [name], male   DOB: October 10, 1955, 64 y.o.   MRN: [id number]    Advanced Heart Failure Team Consult Note   Primary Physician: Galvin Proffer, MD PCP-Cardiologist:  No primary care provider on file.  Reason for Consultation: ECMO initiation  HPI:    Todd [name] is seen today for evaluation of cardiogenic shock at the request of Dr. Dorris Fetch.   64 y.o. with history of DM, COPD, carotid stenosis s/p CEA was admitted with acute systolic CHF.  Echo showed EF 20-25% with wall motion abnormalities, moderate RV dysfunction.  Patient was diuresed and had cath, showing severe 3VD.  Patient had CABG x 4 with LIMA-LAD, SVG-D1, SVG-PDA, and radial-OM1 on 6/24.  Early am 6/25, he had VF arrest that was refractory to treatment initially, multiple rounds of amiodarone, epinephrine, DCCV, HCO3.  Chest was re-opened for cardiac massage and he was taken back to the OR. Code time was about 30 minutes.  In the OR, he was back in NSR.  Decision was made to proceed with VA ECMO given tenuous situation with recent refractory VF. TEE in OR showed EF in the 25% range with moderate RV dysfunction.  Successful cannulation, but post-op patient had ongoing mediastinal bleeding and was taken back to the OR for repeat mediastinal exploration.    He is now stable with minimal chest tube drainage.  ECMO circuit stable.  Pulsatile BP (SBP 130).  He is on milrinone 0.25, NE 35, amiodarone 60, epinephrine 5.  Titrating down on NE.   Swan numbers: CVP 3 PA 29/15 CI 2.2  ECMO 3300 rpm Flow 3.8 L/min Negative pressure -56 DeltaP 18  Review of Systems: Unable to obtain  Home Medications Prior to Admission medications   Medication Sig Start Date End Date Taking? Authorizing Provider  citalopram (CELEXA) 40 MG tablet Take 40 mg by mouth daily.   Yes [provider]  dapagliflozin propanediol (FARXIGA) 10 MG TABS tablet Take 10 mg by mouth daily.   Yes [provider]  ergocalciferol  (VITAMIN D2) 1.25 MG (50000 UT) capsule Take 50,000 Units by mouth 2 (two) times a week.   Yes [provider]  furosemide (LASIX) 20 MG tablet Take 40 mg by mouth daily. 07/16/19  Yes [provider]  glipiZIDE-metformin (METAGLIP) 2.5-500 MG tablet Take 2 tablets by mouth 2 (two) times daily with a meal.   Yes [provider]  ibuprofen (ADVIL) 200 MG tablet Take 400 mg by mouth 2 (two) times daily as needed for moderate pain.   Yes [provider]  levofloxacin (LEVAQUIN) 750 MG tablet Take 750 mg by mouth daily. For 14 days.   Yes [provider]  lisinopril (ZESTRIL) 20 MG tablet Take 20 mg by mouth daily.   Yes [provider]  oxyCODONE (ROXICODONE) 15 MG immediate release tablet Take 15 mg by mouth 3 (three) times daily.   Yes [provider]  pioglitazone (ACTOS) 45 MG tablet Take 45 mg by mouth daily.   Yes [provider]  promethazine (PHENERGAN) 25 MG tablet Take 25 mg by mouth 2 (two) times daily as needed for nausea or vomiting.   Yes [provider]    Past Medical History: Past Medical History:  Diagnosis Date  . Arthritis   . CHF (congestive heart failure) (HCC)   . Depression   . Diabetes mellitus without complication (HCC)   . Hyperlipidemia   . Hypertension     Past Surgical History: Past Surgical History:  Procedure Laterality Date  . APPENDECTOMY  1995  . CAROTID ENDARTERECTOMY  2010  . CORONARY ARTERY BYPASS GRAFT N/A 08/02/2019   Procedure: CORONARY ARTERY BYPASS GRAFTING (CABG), ON PUMP, TIMES FOUR, USING LEFT INTERNAL MAMMARY, LEFT RADIAL ARTERY, AND ENDOSCOPICALLY HARVESTED RIGHT GREATER SAPHENOUS VEIN;  Surgeon: [name], MD;  Location: [facility] OR;  Service: Open Heart Surgery;  Laterality: N/A;  . RADIAL ARTERY HARVEST Left 08/02/2019   Procedure: LEFT RADIAL ARTERY HARVEST;  Surgeon: [name], MD;  Location[facility] OR;  Service: Open Heart Surgery;  Laterality:  Left;  . RIGHT/LEFT HEART CATH AND CORONARY ANGIOGRAPHY N/A 07/30/2019   Procedure: RIGHT/LEFT HEART CATH AND CORONARY ANGIOGRAPHY;  Surgeon: Kathleene Hazel, MD;  Location: [facility] INVASIVE CV LAB;  Service: Cardiovascular;  Laterality: N/A;  . TEE WITHOUT CARDIOVERSION N/A 08/02/2019   Procedure: TRANSESOPHAGEAL ECHOCARDIOGRAM (TEE);  Surgeon: [name], MD;  Location: [facility] OR;  Service: Open Heart Surgery;  Laterality: N/A;    Family History: Family History  Problem Relation Age of Onset  . Heart disease Mother   . Hypertension Mother     Social History: Social History   Socioeconomic History  . Marital status: Unknown    Spouse name: Not on file  . Number of children: Not on file  . Years of education: Not on file  . Highest education level: Not on file  Occupational History  . Not on file  Tobacco Use  . Smoking status: Current Every Day Smoker    Packs/day: 2.50    Types: Cigarettes  . Smokeless tobacco: Never Used  Substance and Sexual Activity  . Alcohol use: Not on file  . Drug use: Yes    Frequency: 7.0 times per week    Types: Marijuana  . Sexual activity: Not on file  Other Topics Concern  . Not on file  Social History Narrative  . Not on file   Social Determinants of Health   Financial Resource Strain:   . Difficulty of Paying Living Expenses:   Food Insecurity:   . Worried About Programme researcher, broadcasting/film/video in the Last Year:   . Barista in the Last Year:   Transportation Needs:   . Freight forwarder (Medical):   Marland Kitchen Lack of Transportation (Non-Medical):   Physical Activity:   . Days of Exercise per Week:   . Minutes of Exercise per Session:   Stress:   . Feeling of Stress :   Social Connections:   . Frequency of Communication with Friends and Family:   . Frequency of Social Gatherings with Friends and Family:   . Attends Religious Services:   . Active Member of Clubs or Organizations:   . Attends Banker Meetings:    Marland Kitchen Marital Status:     Allergies:  Allergies  Allergen Reactions  . Empagliflozin     Other reaction(s): diarrhea  . Other     Other reaction(s): Unknown  . Simvastatin     Other reaction(s): diarrhea  . Sitagliptin     Other reaction(s): diarrhea/abd pain    Objective:    Vital Signs:   Temp:  [95.9 F (35.5 C)-98.8 F (37.1 C)] 97.7 F (36.5 C) (06/25 1245) Pulse Rate:  [80-103] 82 (06/25 1245) Resp:  [0-36] 0 (06/25 1245) BP: (96-123)/(58-74) 113/73 (06/25 1149) SpO2:  [95 %-100 %] 100 % (06/25 1245) Arterial Line BP: (74-255)/(43-233) 121/74 (06/25 1245) FiO2 (%):  [40 %-50 %] 50 % (06/25 1149) Last  BM Date: 07/29/19  Weight change: Filed Weights   07/31/19 0443 08/01/19 0500 08/02/19 0419  Weight: 73.5 kg 73.8 kg 73.3 kg    Intake/Output:   Intake/Output Summary (Last 24 hours) at 08/03/2019 1536 Last data filed at 08/03/2019 0933 Gross per 24 hour  Intake 9115.75 ml  Output 3920 ml  Net 5195.75 ml      Physical Exam    General:  Well appearing. No resp difficulty HEENT: normal Neck: supple. JVP . Carotids 2+ bilat; no bruits. No lymphadenopathy or thyromegaly appreciated. Cor: PMI nondisplaced. Regular rate & rhythm. No rubs, gallops or murmurs. Lungs: clear Abdomen: soft, nontender, nondistended. No hepatosplenomegaly. No bruits or masses. Good bowel sounds. Extremities: no cyanosis, clubbing, rash, edema Neuro: alert & orientedx3, cranial nerves grossly intact. moves all 4 extremities w/o difficulty. Affect pleasant   Telemetry   NSR 70s (personally reviewed)  Labs   Basic Metabolic Panel: Recent Labs  Lab 07/29/19 0755 07/30/19 0303 07/31/19 0410 07/31/19 0410 08/01/19 0227 08/01/19 4540 08/01/19 9811 08/02/19 0416 08/02/19 0818 08/02/19 1318 08/02/19 1318 08/02/19 1409 08/02/19 1413 08/02/19 2130 08/02/19 2145 08/03/19 0329 08/03/19 0420 08/03/19 0950  NA  --    < > 134*   < >  --  136  --   --    < > 139   < > 141   < >  140 138 140 136 144  K  --    < > 4.4   < >  --  4.5  --   --    < > 4.2   < > 3.8   < > 4.7 4.8 4.8 4.8 3.4*  CL  --    < > 95*   < >  --  94*  --   --    < > 97*  --  98  --   --  107  --  107 107  CO2  --    < > 30  --   --  32  --   --   --   --   --   --   --   --  23  --  22 19*  GLUCOSE  --    < > 178*   < >  --  168*  --   --    < > 84  --  76  --   --  141*  --  101* 183*  BUN  --    < > 20   < >  --  25*  --   --    < > 25*  --  23  --   --  21  --  23 22  CREATININE  --    < > 1.15   < >  --  1.38*  --   --    < > 1.00  --  0.90  --   --  1.09  --  1.23 1.38*  CALCIUM  --    < > 8.6*   < >  --  9.0   < >  --   --   --   --   --   --   --  7.9*  --  7.8* 7.1*  MG 2.1   < > 2.0  --  2.0  --   --  2.0  --   --   --   --   --   --  2.6*  --  2.4  --   PHOS 4.0  --   --   --   --   --   --   --   --   --   --   --   --   --   --   --   --   --    < > = values in this interval not displayed.    Liver Function Tests: Recent Labs  Lab 08/03/19 0950  AST 40  ALT 18  ALKPHOS 32*  BILITOT 2.5*  PROT 3.5*  ALBUMIN 2.4*   No results for input(s): LIPASE, AMYLASE in the last 168 hours. No results for input(s): AMMONIA in the last 168 hours.  CBC: Recent Labs  Lab 08/02/19 1549 08/02/19 2130 08/02/19 2145 08/03/19 0329 08/03/19 0420 08/03/19 0950 08/03/19 1308 08/03/19 1309  WBC 16.6*  --  16.5*  --  13.0* 11.9* 9.1  --   HGB 10.5*  9.8*   < > 8.0* 8.5* 8.7* 7.7* 10.9*  --   HCT 31.0*  31.0*   < > 26.3* 25.0* 27.0* 23.4* 31.7*  --   MCV 86.4  --  87.7  --  86.5 88.3 85.9  --   PLT 128*  --  121*  --  112* 71*  70* 43* 44*   < > = values in this interval not displayed.    Cardiac Enzymes: No results for input(s): CKTOTAL, CKMB, CKMBINDEX, TROPONINI in the last 168 hours.  BNP: BNP (last 3 results) Recent Labs    07/26/19 1236  BNP 2,568.2*    ProBNP (last 3 results) No results for input(s): PROBNP in the last 8760 hours.   CBG: Recent Labs  Lab  08/03/19 0501 08/03/19 0602 08/03/19 0953 08/03/19 1254 08/03/19 1422  GLUCAP 95 112* 156* 248* 253*    Coagulation Studies: Recent Labs    08/01/19 0844 08/02/19 1549 08/03/19 0950 08/03/19 1309  LABPROT 12.7 19.1* 21.6* 18.7*  INR 1.0 1.7* 2.0* 1.6*     Imaging   DG Chest 1 View  Result Date: 08/03/2019 CLINICAL DATA:  Status post emergent ECMO and CABG. Rule out foreign bodies in pneumothorax. EXAM: CHEST  1 VIEW COMPARISON:  Earlier today FINDINGS: Support apparatus including: Endotracheal tube, ECMO device, mediastinal drain, bilateral chest tubes, and nasogastric tube identified. No pneumothorax identified. Heart size is mildly enlarged. Diffuse bilateral interstitial opacities compatible with pulmonary edema, increased from previous exam. IMPRESSION: 1. Bilateral chest tubes in place without evidence for pneumothorax. 2. Increase in pulmonary edema. Electronically Signed   By: Signa Kell M.D.   On: 08/03/2019 09:03   DG Chest Port 1 View  Result Date: 08/03/2019 CLINICAL DATA:  Status post ECMO and CABG EXAM: PORTABLE CHEST 1 VIEW COMPARISON:  Chest radiograph from earlier today. FINDINGS: Endotracheal tube tip is 3.7 cm above the carina. Enteric tube enters stomach with the tip not seen on this image. Right internal jugular Swan-Ganz catheter terminates over the right pulmonary artery. Inferior approach mediastinal drain terminates over midline upper mediastinum. Stable position of ECMO catheters overlying the upper mediastinum. Stable bilateral chest tube position. Pacer pad overlies the left chest. Stable cardiomediastinal silhouette with mild cardio. No pneumothorax. No pleural effusion. Moderate to severe pulmonary edema appears unchanged. Stable left basilar atelectasis. IMPRESSION: 1. Well-positioned support structures.  No pneumothorax. 2. Stable mild cardiomegaly and moderate to severe pulmonary edema. 3. Stable left basilar atelectasis. Electronically Signed   By:  Delbert Phenix M.D.   On:  08/03/2019 10:03   DG Chest Port 1 View  Result Date: 08/03/2019 CLINICAL DATA:  Status post cardiac surgery. EXAM: PORTABLE CHEST 1 VIEW COMPARISON:  August 02, 2019. FINDINGS: Stable cardiomegaly. Endotracheal and nasogastric tubes have been removed. Right internal jugular Swan-Ganz catheter is unchanged. Status post coronary bypass graft. Bilateral chest tubes are noted without pneumothorax. Mild left basilar atelectasis is noted with small left pleural effusion. Bony thorax is unremarkable. IMPRESSION: Bilateral chest tubes are noted without pneumothorax. Mild left basilar atelectasis is noted with small left pleural effusion. Electronically Signed   By: Marijo Conception M.D.   On: 08/03/2019 08:14   DG Chest Port 1 View  Result Date: 08/02/2019 CLINICAL DATA:  Postoperative CABG EXAM: PORTABLE CHEST 1 VIEW COMPARISON:  07/26/2019 FINDINGS: Endotracheal tube terminates 4.6 cm above the carina. Enteric tube courses below the diaphragm with distal tip beyond the inferior margin of the film. Right IJ approach Swan-Ganz catheter terminates in the region of the pulmonary trunk. Bilateral chest tubes in place. Stable heart size. No focal airspace consolidation. No large pleural fluid collection. No pneumothorax is identified. IMPRESSION: Status post CABG with support lines and tubes in satisfactory position. No pneumothorax. Electronically Signed   By: Davina Poke D.O.   On: 08/02/2019 16:08      Medications:     Current Medications: . sodium chloride   Intravenous Once  . acetaminophen  1,000 mg Oral Q6H   Or  . acetaminophen (TYLENOL) oral liquid 160 mg/5 mL  1,000 mg Per Tube Q6H  . aspirin EC  325 mg Oral Daily   Or  . aspirin  324 mg Per Tube Daily  . bisacodyl  10 mg Oral Daily   Or  . bisacodyl  10 mg Rectal Daily  . calcium chloride      . calcium chloride  1 g Intravenous Once  . Chlorhexidine Gluconate Cloth  6 each Topical Daily  . [START ON 08/04/2019]  citalopram  20 mg Per Tube Daily  . [START ON 08/04/2019] docusate  200 mg Per Tube Daily  . feeding supplement (GLUCERNA SHAKE)  237 mL Oral TID BM  . fentaNYL (SUBLIMAZE) injection  50 mcg Intravenous Once  . metoCLOPramide (REGLAN) injection  10 mg Intravenous Q6H  . metoprolol tartrate  12.5 mg Oral BID   Or  . metoprolol tartrate  12.5 mg Per Tube BID  . multivitamin with minerals  1 tablet Oral Daily  . nicotine  21 mg Transdermal Daily  . pantoprazole (PROTONIX) IV  40 mg Intravenous QHS  . [START ON 08/04/2019] rosuvastatin  20 mg Per Tube Daily  . sodium chloride flush  10-40 mL Intracatheter Q12H  . sodium chloride flush  3 mL Intravenous Q12H  . umeclidinium bromide  1 puff Inhalation Daily     Infusions: . sodium chloride 10 mL/hr at 08/03/19 1424  . sodium chloride    . sodium chloride 10 mL/hr at 08/02/19 1624  . sodium chloride    . albumin human    . cefUROXime (ZINACEF)  IV 0 mL/hr at 08/03/19 0314  . dexmedetomidine (PRECEDEX) IV infusion 0.4 mcg/kg/hr (08/03/19 0816)  . dextrose 5 % and 0.45% NaCl    . dextrose 5 % and 0.45% NaCl    . EPINEPHrine 4 mg in dextrose 5% 250 mL infusion (16 mcg/mL) 8 mcg/min (08/03/19 0900)  . fentaNYL infusion INTRAVENOUS 100 mcg/hr (08/03/19 1346)  . insulin 0.8 Units/hr (08/03/19 1254)  . lactated ringers 500 mL/hr at  08/02/19 1720  . lactated ringers 20 mL/hr at 08/02/19 1637  . lactated ringers 20 mL/hr at 08/03/19 1247  . milrinone 0.25 mcg/kg/min (08/03/19 0900)  . nitroGLYCERIN 5 mcg/min (08/03/19 0600)  . norepinephrine (LEVOPHED) Adult infusion 35 mcg/min (08/03/19 1333)  . phenylephrine (NEO-SYNEPHRINE) Adult infusion 0 mcg/min (08/03/19 0900)  . potassium chloride 10 mEq (08/03/19 1433)  . propofol (DIPRIVAN) infusion 20 mcg/kg/min (08/03/19 1329)  . vasopressin (PITRESSIN) infusion - *FOR SHOCK*        Assessment/Plan   1. CAD: 3VD s/p CABG x 4 with LIMA-LAD, SVG-D1, SVG-PDA, and radial-OM1 on 6/24. - Continue  ASA 325 and Crestor per tube.  2. Cardiac arrest: VF arrest with prolonged code, VA ECMO begun.  - Continue IV amiodarone.   - Eventual evaluation for ICD.  - Would consider eventual cardiac cath to assess patency of grafts.  3. Cardiogenic shock: Echo with EF 20-25%, moderate RV dysfunction.  Now on VA ECMO post-VF arrest.  Stable s/p return to OR for mediastinal re-exploration due to bleeding.  Currently on NE 32, epinephrine 5, milrinone 0.25 with CI 2.2 and relatively low filling pressures. Creatinine 1.38.  ECMO circuit stable. Currently stable MAP with pulsatile flow.  - Start heparin for ECMO circuit when deemed stable by TCTS.  - Wean pressors as tolerated.  - repeat lactate 4. Acute hypoxemic respiratory failure: In setting of cardiac arrest.  H/o COPD.  CCM managing.   5. Anemia: Now s/p mediastinal re-exploration, decreased chest tube output.   - Transfuse hgb < 8.  6. Thrombocytopenia: Platelets down to 43K after receiving multiple units PRBCs.   - Getting plt transfusion currently.   Length of Stay: 8  Marca Ancona, MD  08/03/2019, 3:36 PM  Advanced Heart Failure Team Pager [phone] (M-F; 7a - 4p)  Please contact [facility] for night-coverage after hours (4p -7a ) and weekends on [email or web address]

## 2019-08-03 NOTE — Op Note (Signed)
CARDIOTHORACIC SURGERY OPERATIVE NOTE  Date of Procedure:   08/03/2019  Preoperative Diagnosis:  Cardiac Arrest s/p CABG  Postoperative Diagnosis:  same  Procedure:    Emergency Bedside Median Sternotomy  Surgeon:    Salvatore Decent. Cornelius Moras, MD    BRIEF CLINICAL NOTE AND INDICATIONS FOR SURGERY  Patient is a 64 year old male with severe left ventricular systolic dysfunction due to ischemic cardiomyopathy who underwent coronary artery bypass grafting for multivessel coronary artery disease on August 02, 2019 by Dr. Dorris Fetch.  The patient's initial postoperative recovery the evening following surgery was uncomplicated and the patient was extubated uneventfully.  Without change in clinical symptoms or hemodynamics the patient suffered a sudden primary VT VF arrest.  CODE BLUE was called and the patient initially resuscitated by the code team.  Despite multiple attempts at cardioversion, uneventful reintubation for mechanical ventilation, and administration of multiple doses of epinephrine, sodium bicarbonate, and amiodarone, the patient remained in full cardiac arrest upon my arrival at the bedside.     DETAILS OF THE OPERATIVE PROCEDURE  The patient's anterior chest was quickly prepared with Betadine solution.  Median sternotomy incision was reopened sharply with an 11 blade knife.  Sternal wires were removed.  Open chest cardiac massage was immediately commenced.  The patient was cardioverted using direct epicardial paddles and initially regained ventricular paced rhythm without sustainable pulse or blood pressure.  Open chest cardiac massage was reinstituted and the patient was further resuscitated with multiple intravenous doses of epinephrine, sodium bicarbonate and amiodarone.  Intravenous volume was administered including albumin and packed red blood cells.  Intermittently the patient was again cardioverted on multiple occasions throughout this process.  Ultimately the patient regained stable  ventricular paced rhythm with stable blood pressure.  Intravenous midazolam was administered and the patient temporarily paralyzed using rocuronium.  Arterial blood gas revealed well compensated pH with good ventilation and excellent oxygenation.  The patient was transported directly to the operating room for further management.     Salvatore Decent. Cornelius Moras MD 08/03/2019 8:03 AM

## 2019-08-03 NOTE — Op Note (Signed)
Todd [name], Todd [name] MEDICAL RECORD [id number] ACCOUNT [id number] DATE OF BIRTH:March 17, 1955 FACILITY: [facility] LOCATION: [facility] PHYSICIAN:Chrisean Kloth Lars Pinks, MD  OPERATIVE REPORT  DATE OF PROCEDURE:  08/02/2019  PREOPERATIVE DIAGNOSIS:  Severe 3-vessel coronary disease with ischemic cardiomyopathy.  POSTOPERATIVE DIAGNOSIS:  Severe 3-vessel coronary disease with ischemic cardiomyopathy.  PROCEDURE:   Median sternotomy, extracorporeal circulation, Coronary artery bypass grafting x 4  Left internal mammary artery to left anterior descending,  Saphenous vein graft to 1st diagonal,  Saphenous vein graft to posterior descending,  Left radial to  obtuse marginal 1 Endoscopic vein harvest right thigh.  SURGEON:  Charlett Lango, MD  ASSISTANT:   1.  Jillyn Hidden, PA 2.  Doree Fudge, PA  ANESTHESIA:  General.  FINDINGS:  Transesophageal echocardiography showed severe left ventricular dysfunction. Mild to moderate MR.  Post-bypass initial increased hypokinesis with septal dyskinesis due to pacing and worsened MR, but returned to baseline over time.  LAD 1.5 mm diffusely diseased, fair quality.  1st diagonal and OM1 fair quality.  Posterior descending 1 mm poor quality target. Good quality conduits.  CLINICAL NOTE:  Todd [name] is a 64 year old gentleman with multiple cardiac risk factors who presented with progressive shortness of breath and was found to be in decompensated heart failure.  He had an echocardiogram which showed severe left  ventricular dysfunction with ejection fraction of 20-25%.  At cardiac catheterization, he had severe 3-vessel disease with diffusely diseased target vessels.  He was referred for coronary artery bypass grafting.  The indications, risks, benefits, and  alternatives were discussed in detail with the patient.  He understood and accepted the risks and agreed to proceed.  He understood the high risk nature of the procedure.  DESCRIPTION OF  PROCEDURE:  Todd [name] on 08/02/2019.  Anesthesia placed a Swan-Ganz catheter and an arterial blood pressure monitoring line.  He was taken to the Operating Room, anesthetized and intubated.  A  Foley catheter was placed.  Intravenous antibiotics were administered.  Transesophageal echocardiography was performed by Dr. Bradley Ferris.  Findings as noted above.  The chest, abdomen, legs and left arm were prepped and draped in the usual sterile fashion.     The conduits were harvested simultaneously.  Heparin 2000 units was administered during the vessel harvest.  A median sternotomy was performed and the left internal mammary artery was harvested under direct vision.  At the same time, the left radial  artery was harvested in an open fashion and the right greater saphenous vein was harvested from the thigh endoscopically.  All of the conduits were good quality.  There was an excellent pulse in the radial artery distally when occluded proximally.   After harvesting the vein and the radial artery, those incisions were closed and the left arm was tucked back to the patient's side.  The remainder of the full heparin dose was administered.  The sternal retractor was placed and gently opened over time.  Pleural spaces were entered.  The left pleural space was entered during the mammary takedown and 1.5 liters of serous fluid was evacuated.  The right pleural space was entered and 1.5 liters  of pleural fluid was evacuated from that site as well.  The pericardium was opened.  There was a moderate pericardial effusion that was approximately 250 mL.  The ascending aorta was inspected.  There was some plaque distally in the ascending aorta, but  it did not interfere with placement of the cannula or the cross clamp.  After confirming adequate anticoagulation with ACT measurement, the aorta was cannulated via concentric 2-0 Ethibond pledgeted pursestring sutures.  A dual stage  venous cannula was  placed via a pursestring suture in the right atrial appendage.  Cardiopulmonary bypass was initiated.  Flows were maintained per protocol.  There was massive cardiomegaly.  The coronary arteries were inspected and anastomotic sites were chosen.  The  acute marginal vessel was not suitable for grafting.  The posterior descending was a poor quality target but it was elected to graft that vessel.  The remaining targets appeared to be fair quality.  The conduits were inspected and cut to length.  A foam  pad was placed in the pericardium to insulate the heart.  A temperature probe was placed in the myocardial septum.  A retrograde cardioplegia cannula was placed via a pursestring suture in the right atrium and directed into the coronary sinus.  An antegrade  cardioplegia cannula was placed in the ascending aorta.  The aorta was crossclamped.  The left ventricle was emptied via the aortic root vent.  Cardiac arrest was achieved with a combination of cold antegrade and retrograde blood cardioplegia and topical iced saline.  500 mL of cardioplegia was administered  antegrade.  There was a rapid diastolic arrest.  750 mL of cardioplegia was administered retrograde and there was septal cooling to less than 10 degrees Celsius.  Additional cardioplegia was given at the completion of each graft.  A reversed saphenous vein graft was placed end-to-side to the posterior descending branch.  This vessel was the only graftable vessel along the inferior wall.  It was a 1 mm poor quality target.  A probe did pass distally.  The vein was of good quality.  It  was anastomosed end-to-side with a running 7-0 Prolene suture.  A probe did pass proximally and distally at the completion of the anastomosis.  Cardioplegia was administered down the graft.  There was satisfactory flow and good hemostasis.  A reversed saphenous vein graft was placed end-to-side to the 1st diagonal branch of LAD.  This was a high  anterolateral branch.  It was a 1.5 mm fair quality target.  Vein was of good quality.  It was anastomosed end-to-side with a running 7-0 Prolene  suture.  Again, a probe passed easily proximally and distally.  There was good flow and good hemostasis with cardioplegia administration.  Additional cardioplegia was administered retrograde.  The heart was elevated, exposing the lateral wall.  The only graftable vessel on the lateral wall was OM1.  It was a 1.5 mm fair quality target.  The radial was of good quality.  The distal end was  bevelled and then anastomosed end-to-side to the OM1 with a running 8-0 Prolene suture.  At the completion of the anastomosis, cardioplegia was administered down the radial graft and there was good flow and good hemostasis.  The left internal mammary artery was brought through a window in the pericardium.  The distal end was bevelled.  It was anastomosed end-to-side to the distal LAD.  The LAD was 1.5 mm vessel.  It did accept a probe proximally and distally.  It was of  fair quality with diffuse disease.  The end-to-side anastomosis was performed with a running 8-0 Prolene suture.  At the completion of the mammary to LAD anastomosis, the bulldog clamp was briefly removed.  Rapid septal rewarming was noted.  The bulldog  clamp was replaced and the mammary pedicle was tacked to the epicardial surface of the  heart with 6-0 Prolene sutures.  Additional cardioplegia was administered via the retrograde cannula.  The radial and vein grafts were cut to length.  The diagonal vein was too short to reach the aorta, so the decision was made to take that off the radial graft as a Y graft.  4.0 mm  punch aortotomies were made in the ascending aorta.  There was significant bleeding despite replacement of the cross clamp and what appeared to be good occlusion of the aorta.  The crossclamp was removed and a partial occlusion clamp was placed.  The  proximal anastomoses then were performed to  4.0 mm punch aortotomies.  The total crossclamp time was 96 minutes.  The vein proximal was performed with a running 6-0 Prolene suture and the radial proximal was performed with a running 7-0 Prolene  suture.  At the completion of the 2nd proximal anastomosis, the patient was placed in Trendelenburg position.  The partial occlusion clamp was removed, de-airing as the clamp was removed before tying the final suture.  Air was aspirated from the vein  graft and flow was restored there.  Bulldog clamps were placed proximally and distally on the radial artery graft.  An arteriotomy was made in this near its proximal anastomosis.  The proximal end of the diagonal vein was bevelled and was anastomosed  end-to-side to the radial with a running 7-0 Prolene suture.  This anastomosis was deaired before reestablishing flow through both grafts.  The patient was loaded with milrinone and an infusion was initiated.  While rewarming was completed, all proximal and  distal anastomoses were inspected for hemostasis.  Additional sutures were placed as needed.  Epicardial pacing wires were placed on the right ventricle and right atrium.  When the patient had rewarmed to a core temperature of 37 degrees Celsius, he was  weaned from cardiopulmonary bypass.  He was DDD paced and on milrinone and norepinephrine infusions at the time of separation from bypass.  The total bypass time was 211 minutes.  Initially, the patient had relatively high pulmonary artery pressures and  relatively low systemic pressures.  Transesophageal echocardiography showed worsened global hypokinesis with septal dyskinesis due to pacing and 3+ mitral regurgitation.  Over time, this improved dramatically and there was excellent systemic  improvement.  The cardiac index went from 1.5 up to 2.2 and systolic pressures normalized as did the pulmonary artery pressures.  With a test dose of protamine there was again increase in the pulmonary artery pressures that  then resolved with time.  The atrial  and aortic cannulae were removed.  The remainder of protamine was administered slowly without any significant hemodynamic effects.  Chest was irrigated copiously with warm saline.  Hemostasis was achieved.  28-French Blake drains were placed in both  pleural spaces and a 36-French chest tube was placed in the anterior mediastinum.  The pericardium was reapproximated with interrupted 3-0 silk sutures.  It came together easily without tension.  The sternum was closed with a combination of single and  double heavy gauge stainless steel wires.  Pectoralis fascia, subcutaneous tissue and skin were closed in standard fashion.  There was a missing needle from an 8-0 Prolene suture, but the sponge and instrument counts were correct.    Approximately 15 minutes after closure, but before the patient had been transported to the ICU, there was an acute deterioration with systemic hypotension and elevated pulmonary artery pressures.  Vasopressin infusion was initiated.  The milrinone dose  was decreased, but then was  subsequently increased back to 0.375.  About the time the decision was being considered to reopen the patient, there was a dramatic improvement with systolic pressures returning to low normal and pulmonary artery pressures  normalizing, as well.  Echocardiogram showed significant improvement in wall motion back to the baseline.  The patient then was observed and remained stable and then was taken from the operating room to the Surgical Intensive Care Unit, intubated and in  critical condition.  CN/NUANCE  D:08/02/2019 T:08/03/2019 JOB:[id number]

## 2019-08-03 NOTE — Progress Notes (Signed)
OT Cancellation Note  Patient Details Name: Todd [name] MRN: [id number] DOB: Jun 19, 1955   Cancelled Treatment:    Reason Eval/Treat Not Completed: Patient at procedure or test/ unavailable (in sx)  Mateo Flow 08/03/2019, 8:50 AM

## 2019-08-04 ENCOUNTER — Inpatient Hospital Stay (HOSPITAL_COMMUNITY): Payer: Medicare Other

## 2019-08-04 LAB — CBC
HCT: 23.2 % — ABNORMAL LOW (ref 39.0–52.0)
HCT: 27.5 % — ABNORMAL LOW (ref 39.0–52.0)
Hemoglobin: 8.1 g/dL — ABNORMAL LOW (ref 13.0–17.0)
Hemoglobin: 9.6 g/dL — ABNORMAL LOW (ref 13.0–17.0)
MCH: 29.8 pg (ref 26.0–34.0)
MCH: 30 pg (ref 26.0–34.0)
MCHC: 34.9 g/dL (ref 30.0–36.0)
MCHC: 34.9 g/dL (ref 30.0–36.0)
MCV: 85.3 fL (ref 80.0–100.0)
MCV: 85.9 fL (ref 80.0–100.0)
Platelets: 93 10*3/uL — ABNORMAL LOW (ref 150–400)
Platelets: 70 10*3/uL — ABNORMAL LOW (ref 150–400)
RBC: 2.72 MIL/uL — ABNORMAL LOW (ref 4.22–5.81)
RBC: 3.2 MIL/uL — ABNORMAL LOW (ref 4.22–5.81)
RDW: 14.2 % (ref 11.5–15.5)
RDW: 14.4 % (ref 11.5–15.5)
WBC: 9.3 10*3/uL (ref 4.0–10.5)
WBC: 10.4 10*3/uL (ref 4.0–10.5)
nRBC: 0 % (ref 0.0–0.2)
nRBC: 0 % (ref 0.0–0.2)

## 2019-08-04 LAB — BASIC METABOLIC PANEL
Anion gap: 10 (ref 5–15)
Anion gap: 10 (ref 5–15)
BUN: 23 mg/dL (ref 8–23)
BUN: 21 mg/dL (ref 8–23)
CO2: 26 mmol/L (ref 22–32)
CO2: 25 mmol/L (ref 22–32)
Calcium: 7.2 mg/dL — ABNORMAL LOW (ref 8.9–10.3)
Calcium: 7.3 mg/dL — ABNORMAL LOW (ref 8.9–10.3)
Chloride: 107 mmol/L (ref 98–111)
Chloride: 105 mmol/L (ref 98–111)
Creatinine, Ser: 1.16 mg/dL (ref 0.61–1.24)
Creatinine, Ser: 1.27 mg/dL — ABNORMAL HIGH (ref 0.61–1.24)
GFR calc Af Amer: >60 mL/min (ref >60)
GFR calc Af Amer: >60 mL/min (ref >60)
GFR calc non Af Amer: >60 mL/min (ref >60)
GFR calc non Af Amer: 59 mL/min — ABNORMAL LOW (ref >60)
Glucose, Bld: 129 mg/dL — ABNORMAL HIGH (ref 70–99)
Glucose, Bld: 152 mg/dL — ABNORMAL HIGH (ref 70–99)
Potassium: 3.8 mmol/L (ref 3.5–5.1)
Potassium: 4.6 mmol/L (ref 3.5–5.1)
Sodium: 143 mmol/L (ref 135–145)
Sodium: 140 mmol/L (ref 135–145)

## 2019-08-04 LAB — POCT I-STAT 7, (LYTES, BLD GAS, ICA,H+H)
Acid-Base Excess: 5 mmol/L — ABNORMAL HIGH (ref 0.0–2.0)
Acid-Base Excess: 5 mmol/L — ABNORMAL HIGH (ref 0.0–2.0)
Acid-Base Excess: 5 mmol/L — ABNORMAL HIGH (ref 0.0–2.0)
Acid-Base Excess: 5 mmol/L — ABNORMAL HIGH (ref 0.0–2.0)
Acid-Base Excess: 6 mmol/L — ABNORMAL HIGH (ref 0.0–2.0)
Acid-base deficit: 2 mmol/L (ref 0.0–2.0)
Acid-base deficit: 3 mmol/L — ABNORMAL HIGH (ref 0.0–2.0)
Bicarbonate: 21.3 mmol/L (ref 20.0–28.0)
Bicarbonate: 23.8 mmol/L (ref 20.0–28.0)
Bicarbonate: 28.6 mmol/L — ABNORMAL HIGH (ref 20.0–28.0)
Bicarbonate: 28.7 mmol/L — ABNORMAL HIGH (ref 20.0–28.0)
Bicarbonate: 28.8 mmol/L — ABNORMAL HIGH (ref 20.0–28.0)
Bicarbonate: 30.1 mmol/L — ABNORMAL HIGH (ref 20.0–28.0)
Bicarbonate: 30.2 mmol/L — ABNORMAL HIGH (ref 20.0–28.0)
Calcium, Ion: 0.73 mmol/L — CL (ref 1.15–1.40)
Calcium, Ion: 1.06 mmol/L — ABNORMAL LOW (ref 1.15–1.40)
Calcium, Ion: 1.07 mmol/L — ABNORMAL LOW (ref 1.15–1.40)
Calcium, Ion: 1.08 mmol/L — ABNORMAL LOW (ref 1.15–1.40)
Calcium, Ion: 1.09 mmol/L — ABNORMAL LOW (ref 1.15–1.40)
Calcium, Ion: 1.1 mmol/L — ABNORMAL LOW (ref 1.15–1.40)
Calcium, Ion: 1.16 mmol/L (ref 1.15–1.40)
HCT: 20 % — ABNORMAL LOW (ref 39.0–52.0)
HCT: 22 % — ABNORMAL LOW (ref 39.0–52.0)
HCT: 24 % — ABNORMAL LOW (ref 39.0–52.0)
HCT: 26 % — ABNORMAL LOW (ref 39.0–52.0)
HCT: 27 % — ABNORMAL LOW (ref 39.0–52.0)
HCT: 28 % — ABNORMAL LOW (ref 39.0–52.0)
HCT: 30 % — ABNORMAL LOW (ref 39.0–52.0)
Hemoglobin: 10.2 g/dL — ABNORMAL LOW (ref 13.0–17.0)
Hemoglobin: 6.8 g/dL — CL (ref 13.0–17.0)
Hemoglobin: 7.5 g/dL — ABNORMAL LOW (ref 13.0–17.0)
Hemoglobin: 8.2 g/dL — ABNORMAL LOW (ref 13.0–17.0)
Hemoglobin: 8.8 g/dL — ABNORMAL LOW (ref 13.0–17.0)
Hemoglobin: 9.2 g/dL — ABNORMAL LOW (ref 13.0–17.0)
Hemoglobin: 9.5 g/dL — ABNORMAL LOW (ref 13.0–17.0)
O2 Saturation: 100 %
O2 Saturation: 100 %
O2 Saturation: 100 %
O2 Saturation: 100 %
O2 Saturation: 100 %
O2 Saturation: 100 %
O2 Saturation: 96 %
Patient temperature: 34.3
Patient temperature: 36.5
Patient temperature: 36.5
Patient temperature: 36.6
Patient temperature: 36.7
Patient temperature: 36.7
Patient temperature: 37.1
Potassium: 3.3 mmol/L — ABNORMAL LOW (ref 3.5–5.1)
Potassium: 3.7 mmol/L (ref 3.5–5.1)
Potassium: 3.8 mmol/L (ref 3.5–5.1)
Potassium: 4.3 mmol/L (ref 3.5–5.1)
Potassium: 4.4 mmol/L (ref 3.5–5.1)
Potassium: 4.5 mmol/L (ref 3.5–5.1)
Potassium: 4.8 mmol/L (ref 3.5–5.1)
Sodium: 140 mmol/L (ref 135–145)
Sodium: 141 mmol/L (ref 135–145)
Sodium: 141 mmol/L (ref 135–145)
Sodium: 143 mmol/L (ref 135–145)
Sodium: 144 mmol/L (ref 135–145)
Sodium: 145 mmol/L (ref 135–145)
Sodium: 157 mmol/L — ABNORMAL HIGH (ref 135–145)
TCO2: 22 mmol/L (ref 22–32)
TCO2: 25 mmol/L (ref 22–32)
TCO2: 30 mmol/L (ref 22–32)
TCO2: 30 mmol/L (ref 22–32)
TCO2: 30 mmol/L (ref 22–32)
TCO2: 31 mmol/L (ref 22–32)
TCO2: 31 mmol/L (ref 22–32)
pCO2 arterial: 28.4 mmHg — ABNORMAL LOW (ref 32.0–48.0)
pCO2 arterial: 37.6 mmHg (ref 32.0–48.0)
pCO2 arterial: 38.6 mmHg (ref 32.0–48.0)
pCO2 arterial: 38.6 mmHg (ref 32.0–48.0)
pCO2 arterial: 41.1 mmHg (ref 32.0–48.0)
pCO2 arterial: 43 mmHg (ref 32.0–48.0)
pCO2 arterial: 46 mmHg (ref 32.0–48.0)
pH, Arterial: 7.322 — ABNORMAL LOW (ref 7.350–7.450)
pH, Arterial: 7.452 — ABNORMAL HIGH (ref 7.350–7.450)
pH, Arterial: 7.472 — ABNORMAL HIGH (ref 7.350–7.450)
pH, Arterial: 7.472 — ABNORMAL HIGH (ref 7.350–7.450)
pH, Arterial: 7.476 — ABNORMAL HIGH (ref 7.350–7.450)
pH, Arterial: 7.477 — ABNORMAL HIGH (ref 7.350–7.450)
pH, Arterial: 7.491 — ABNORMAL HIGH (ref 7.350–7.450)
pO2, Arterial: 212 mmHg — ABNORMAL HIGH (ref 83.0–108.0)
pO2, Arterial: 230 mmHg — ABNORMAL HIGH (ref 83.0–108.0)
pO2, Arterial: 244 mmHg — ABNORMAL HIGH (ref 83.0–108.0)
pO2, Arterial: 251 mmHg — ABNORMAL HIGH (ref 83.0–108.0)
pO2, Arterial: 261 mmHg — ABNORMAL HIGH (ref 83.0–108.0)
pO2, Arterial: 313 mmHg — ABNORMAL HIGH (ref 83.0–108.0)
pO2, Arterial: 89 mmHg (ref 83.0–108.0)

## 2019-08-04 LAB — PREPARE PLATELET PHERESIS
Unit division: 0
Unit division: 0
Unit division: 0

## 2019-08-04 LAB — GLUCOSE, CAPILLARY
Glucose-Capillary: 105 mg/dL — ABNORMAL HIGH (ref 70–99)
Glucose-Capillary: 108 mg/dL — ABNORMAL HIGH (ref 70–99)
Glucose-Capillary: 110 mg/dL — ABNORMAL HIGH (ref 70–99)
Glucose-Capillary: 116 mg/dL — ABNORMAL HIGH (ref 70–99)
Glucose-Capillary: 118 mg/dL — ABNORMAL HIGH (ref 70–99)
Glucose-Capillary: 123 mg/dL — ABNORMAL HIGH (ref 70–99)
Glucose-Capillary: 125 mg/dL — ABNORMAL HIGH (ref 70–99)
Glucose-Capillary: 138 mg/dL — ABNORMAL HIGH (ref 70–99)
Glucose-Capillary: 139 mg/dL — ABNORMAL HIGH (ref 70–99)
Glucose-Capillary: 150 mg/dL — ABNORMAL HIGH (ref 70–99)
Glucose-Capillary: 153 mg/dL — ABNORMAL HIGH (ref 70–99)
Glucose-Capillary: 202 mg/dL — ABNORMAL HIGH (ref 70–99)
Glucose-Capillary: 92 mg/dL (ref 70–99)

## 2019-08-04 LAB — PREPARE FRESH FROZEN PLASMA
Unit division: 0
Unit division: 0
Unit division: 0
Unit division: 0
Unit division: 0
Unit division: 0
Unit division: 0
Unit division: 0
Unit division: 0
Unit division: 0

## 2019-08-04 LAB — BPAM FFP
Blood Product Expiration Date: 202106292359
Blood Product Expiration Date: 202106292359
Blood Product Expiration Date: 202106302359
Blood Product Expiration Date: 202106302359
Blood Product Expiration Date: 202106302359
Blood Product Expiration Date: 202106302359
Blood Product Expiration Date: 202107142359
Blood Product Expiration Date: 202107142359
Blood Product Expiration Date: 202107142359
Blood Product Expiration Date: 202107162359
Blood Product Expiration Date: 202107162359
Blood Product Expiration Date: 202107172359
ISSUE DATE / TIME: 202106242243
ISSUE DATE / TIME: 202106242243
ISSUE DATE / TIME: 202106250633
ISSUE DATE / TIME: 202106250633
ISSUE DATE / TIME: 202106250707
ISSUE DATE / TIME: 202106250707
ISSUE DATE / TIME: 202106250707
ISSUE DATE / TIME: 202106250707
ISSUE DATE / TIME: 202106251130
ISSUE DATE / TIME: 202106251130
ISSUE DATE / TIME: 202106251130
ISSUE DATE / TIME: 202106251130
Unit Type and Rh: 600
Unit Type and Rh: 6200
Unit Type and Rh: 6200
Unit Type and Rh: 6200
Unit Type and Rh: 6200
Unit Type and Rh: 6200
Unit Type and Rh: 6200
Unit Type and Rh: 6200
Unit Type and Rh: 6200
Unit Type and Rh: 6200
Unit Type and Rh: 6200
Unit Type and Rh: 6200

## 2019-08-04 LAB — HEPATIC FUNCTION PANEL
ALT: 18 U/L (ref 0–44)
AST: 42 U/L — ABNORMAL HIGH (ref 15–41)
Albumin: 2.2 g/dL — ABNORMAL LOW (ref 3.5–5.0)
Alkaline Phosphatase: 30 U/L — ABNORMAL LOW (ref 38–126)
Bilirubin, Direct: 1.5 mg/dL — ABNORMAL HIGH (ref 0.0–0.2)
Indirect Bilirubin: 1.2 mg/dL — ABNORMAL HIGH (ref 0.3–0.9)
Total Bilirubin: 2.7 mg/dL — ABNORMAL HIGH (ref 0.3–1.2)
Total Protein: 3.6 g/dL — ABNORMAL LOW (ref 6.5–8.1)

## 2019-08-04 LAB — BPAM PLATELET PHERESIS
Blood Product Expiration Date: 202106282359
Blood Product Expiration Date: 202106272359
Blood Product Expiration Date: 202106272359
ISSUE DATE / TIME: 202106251430
ISSUE DATE / TIME: 202106251701
ISSUE DATE / TIME: 202106252300
Unit Type and Rh: 6200
Unit Type and Rh: 600
Unit Type and Rh: 9500

## 2019-08-04 LAB — PROTIME-INR
INR: 1.5 — ABNORMAL HIGH (ref 0.8–1.2)
Prothrombin Time: 17.9 seconds — ABNORMAL HIGH (ref 11.4–15.2)

## 2019-08-04 LAB — MAGNESIUM: Magnesium: 1.7 mg/dL (ref 1.7–2.4)

## 2019-08-04 LAB — COOXEMETRY PANEL
Carboxyhemoglobin: 1.7 % — ABNORMAL HIGH (ref 0.5–1.5)
Methemoglobin: 1.4 % (ref 0.0–1.5)
O2 Saturation: 79.6 %
Total hemoglobin: 8.2 g/dL — ABNORMAL LOW (ref 12.0–16.0)

## 2019-08-04 LAB — APTT
aPTT: 43 seconds — ABNORMAL HIGH (ref 24–36)
aPTT: 42 seconds — ABNORMAL HIGH (ref 24–36)
aPTT: 57 seconds — ABNORMAL HIGH (ref 24–36)
aPTT: 57 seconds — ABNORMAL HIGH (ref 24–36)

## 2019-08-04 LAB — LACTATE DEHYDROGENASE: LDH: 244 U/L — ABNORMAL HIGH (ref 98–192)

## 2019-08-04 LAB — HEPARIN LEVEL (UNFRACTIONATED): Heparin Unfractionated: <0.1 IU/mL — ABNORMAL LOW (ref 0.30–0.70)

## 2019-08-04 LAB — LACTIC ACID, PLASMA: Lactic Acid, Venous: 1.3 mmol/L (ref 0.5–1.9)

## 2019-08-04 LAB — FIBRINOGEN: Fibrinogen: 303 mg/dL (ref 210–475)

## 2019-08-04 LAB — PREPARE RBC (CROSSMATCH)

## 2019-08-04 MED ORDER — INSULIN ASPART 100 UNIT/ML ~~LOC~~ SOLN
0.0000 [IU] | SUBCUTANEOUS | Status: DC
  Administered 2019-08-04: 8 [IU] via SUBCUTANEOUS
  Administered 2019-08-04 (×2): 2 [IU] via SUBCUTANEOUS
  Administered 2019-08-05 (×2): 4 [IU] via SUBCUTANEOUS
  Administered 2019-08-05 (×4): 2 [IU] via SUBCUTANEOUS
  Administered 2019-08-06: 4 [IU] via SUBCUTANEOUS
  Administered 2019-08-06: 2 [IU] via SUBCUTANEOUS
  Administered 2019-08-07 (×2): 8 [IU] via SUBCUTANEOUS
  Administered 2019-08-07 – 2019-08-12 (×15): 2 [IU] via SUBCUTANEOUS
  Administered 2019-08-12 (×2): 4 [IU] via SUBCUTANEOUS
  Administered 2019-08-12 – 2019-08-13 (×2): 2 [IU] via SUBCUTANEOUS
  Administered 2019-08-13 (×2): 8 [IU] via SUBCUTANEOUS
  Administered 2019-08-13: 2 [IU] via SUBCUTANEOUS
  Administered 2019-08-13 – 2019-08-14 (×3): 8 [IU] via SUBCUTANEOUS
  Administered 2019-08-14 – 2019-08-15 (×4): 4 [IU] via SUBCUTANEOUS
  Administered 2019-08-15: 2 [IU] via SUBCUTANEOUS
  Administered 2019-08-15: 12 [IU] via SUBCUTANEOUS
  Administered 2019-08-15 – 2019-08-16 (×2): 2 [IU] via SUBCUTANEOUS
  Administered 2019-08-16: 12 [IU] via SUBCUTANEOUS
  Administered 2019-08-16 (×2): 2 [IU] via SUBCUTANEOUS
  Administered 2019-08-17: 8 [IU] via SUBCUTANEOUS
  Administered 2019-08-17: 12 [IU] via SUBCUTANEOUS
  Administered 2019-08-17: 16 [IU] via SUBCUTANEOUS
  Administered 2019-08-17: 4 [IU] via SUBCUTANEOUS
  Administered 2019-08-18: 8 [IU] via SUBCUTANEOUS
  Administered 2019-08-18 (×5): 4 [IU] via SUBCUTANEOUS
  Administered 2019-08-19: 2 [IU] via SUBCUTANEOUS
  Administered 2019-08-19 (×3): 4 [IU] via SUBCUTANEOUS
  Administered 2019-08-19: 2 [IU] via SUBCUTANEOUS
  Administered 2019-08-20: 4 [IU] via SUBCUTANEOUS
  Administered 2019-08-20 – 2019-08-21 (×5): 2 [IU] via SUBCUTANEOUS
  Administered 2019-08-22: 8 [IU] via SUBCUTANEOUS
  Administered 2019-08-22 (×3): 4 [IU] via SUBCUTANEOUS
  Administered 2019-08-23: 2 [IU] via SUBCUTANEOUS
  Administered 2019-08-23 – 2019-08-24 (×2): 8 [IU] via SUBCUTANEOUS
  Administered 2019-08-24 – 2019-08-25 (×2): 4 [IU] via SUBCUTANEOUS
  Administered 2019-08-25: 8 [IU] via SUBCUTANEOUS
  Administered 2019-08-25: 4 [IU] via SUBCUTANEOUS
  Administered 2019-08-25 – 2019-08-26 (×4): 2 [IU] via SUBCUTANEOUS
  Administered 2019-08-26: 4 [IU] via SUBCUTANEOUS
  Administered 2019-08-26: 8 [IU] via SUBCUTANEOUS
  Administered 2019-08-26: 4 [IU] via SUBCUTANEOUS
  Administered 2019-08-26: 2 [IU] via SUBCUTANEOUS
  Administered 2019-08-27: 4 [IU] via SUBCUTANEOUS
  Administered 2019-08-27: 8 [IU] via SUBCUTANEOUS
  Administered 2019-08-27: 4 [IU] via SUBCUTANEOUS
  Administered 2019-08-28: 8 [IU] via SUBCUTANEOUS
  Administered 2019-08-28 – 2019-08-29 (×4): 2 [IU] via SUBCUTANEOUS
  Administered 2019-08-29: 8 [IU] via SUBCUTANEOUS
  Administered 2019-08-29 (×2): 4 [IU] via SUBCUTANEOUS
  Administered 2019-08-29: 8 [IU] via SUBCUTANEOUS
  Administered 2019-08-29: 4 [IU] via SUBCUTANEOUS
  Administered 2019-08-30: 12 [IU] via SUBCUTANEOUS
  Administered 2019-08-30: 8 [IU] via SUBCUTANEOUS
  Administered 2019-08-30: 2 [IU] via SUBCUTANEOUS
  Administered 2019-08-30: 4 [IU] via SUBCUTANEOUS
  Administered 2019-08-30: 12 [IU] via SUBCUTANEOUS
  Administered 2019-08-30: 4 [IU] via SUBCUTANEOUS
  Administered 2019-08-31: 8 [IU] via SUBCUTANEOUS
  Administered 2019-08-31 (×2): 2 [IU] via SUBCUTANEOUS
  Administered 2019-08-31: 12 [IU] via SUBCUTANEOUS
  Administered 2019-08-31: 4 [IU] via SUBCUTANEOUS
  Administered 2019-08-31: 8 [IU] via SUBCUTANEOUS
  Administered 2019-09-01 (×2): 4 [IU] via SUBCUTANEOUS
  Administered 2019-09-01: 8 [IU] via SUBCUTANEOUS
  Administered 2019-09-01: 2 [IU] via SUBCUTANEOUS
  Administered 2019-09-01: 4 [IU] via SUBCUTANEOUS
  Administered 2019-09-01: 8 [IU] via SUBCUTANEOUS
  Administered 2019-09-01 – 2019-09-02 (×2): 2 [IU] via SUBCUTANEOUS
  Administered 2019-09-02 – 2019-09-03 (×4): 4 [IU] via SUBCUTANEOUS
  Administered 2019-09-03 – 2019-09-04 (×2): 2 [IU] via SUBCUTANEOUS
  Administered 2019-09-04 – 2019-09-05 (×4): 8 [IU] via SUBCUTANEOUS
  Administered 2019-09-06: 4 [IU] via SUBCUTANEOUS
  Administered 2019-09-06: 8 [IU] via SUBCUTANEOUS
  Administered 2019-09-06 (×2): 4 [IU] via SUBCUTANEOUS
  Administered 2019-09-06: 2 [IU] via SUBCUTANEOUS
  Administered 2019-09-06: 4 [IU] via SUBCUTANEOUS
  Administered 2019-09-06: 3 [IU] via SUBCUTANEOUS
  Administered 2019-09-07 (×2): 2 [IU] via SUBCUTANEOUS
  Administered 2019-09-07: 4 [IU] via SUBCUTANEOUS
  Administered 2019-09-07: 8 [IU] via SUBCUTANEOUS
  Administered 2019-09-07: 4 [IU] via SUBCUTANEOUS
  Administered 2019-09-07: 2 [IU] via SUBCUTANEOUS
  Administered 2019-09-08: 4 [IU] via SUBCUTANEOUS
  Administered 2019-09-08: 8 [IU] via SUBCUTANEOUS
  Administered 2019-09-08 (×2): 2 [IU] via SUBCUTANEOUS
  Administered 2019-09-08 – 2019-09-09 (×2): 4 [IU] via SUBCUTANEOUS
  Administered 2019-09-09: 8 [IU] via SUBCUTANEOUS
  Administered 2019-09-09 (×4): 4 [IU] via SUBCUTANEOUS
  Administered 2019-09-10: 8 [IU] via SUBCUTANEOUS
  Administered 2019-09-10: 4 [IU] via SUBCUTANEOUS
  Administered 2019-09-10: 2 [IU] via SUBCUTANEOUS
  Administered 2019-09-10: 8 [IU] via SUBCUTANEOUS
  Administered 2019-09-10 – 2019-09-11 (×3): 2 [IU] via SUBCUTANEOUS
  Administered 2019-09-11 – 2019-09-12 (×2): 4 [IU] via SUBCUTANEOUS
  Administered 2019-09-12: 2 [IU] via SUBCUTANEOUS
  Administered 2019-09-12 (×2): 4 [IU] via SUBCUTANEOUS
  Administered 2019-09-12 – 2019-09-13 (×4): 2 [IU] via SUBCUTANEOUS
  Administered 2019-09-13: 4 [IU] via SUBCUTANEOUS
  Administered 2019-09-13 (×3): 2 [IU] via SUBCUTANEOUS
  Administered 2019-09-14 (×2): 4 [IU] via SUBCUTANEOUS
  Administered 2019-09-14: 2 [IU] via SUBCUTANEOUS
  Administered 2019-09-14 (×2): 4 [IU] via SUBCUTANEOUS
  Administered 2019-09-14: 8 [IU] via SUBCUTANEOUS
  Administered 2019-09-15 (×2): 4 [IU] via SUBCUTANEOUS
  Administered 2019-09-15: 8 [IU] via SUBCUTANEOUS
  Administered 2019-09-15: 2 [IU] via SUBCUTANEOUS
  Administered 2019-09-15: 7 [IU] via SUBCUTANEOUS
  Administered 2019-09-15: 4 [IU] via SUBCUTANEOUS
  Administered 2019-09-16: 2 [IU] via SUBCUTANEOUS
  Administered 2019-09-16: 4 [IU] via SUBCUTANEOUS
  Administered 2019-09-16: 8 [IU] via SUBCUTANEOUS
  Administered 2019-09-16: 4 [IU] via SUBCUTANEOUS
  Administered 2019-09-16 – 2019-09-17 (×3): 2 [IU] via SUBCUTANEOUS
  Administered 2019-09-17: 4 [IU] via SUBCUTANEOUS
  Administered 2019-09-17 – 2019-09-18 (×5): 2 [IU] via SUBCUTANEOUS
  Administered 2019-09-18 – 2019-09-19 (×3): 4 [IU] via SUBCUTANEOUS
  Administered 2019-09-19: 8 [IU] via SUBCUTANEOUS
  Administered 2019-09-19: 2 [IU] via SUBCUTANEOUS
  Administered 2019-09-19: 4 [IU] via SUBCUTANEOUS
  Administered 2019-09-19: 2 [IU] via SUBCUTANEOUS
  Administered 2019-09-20: 4 [IU] via SUBCUTANEOUS
  Administered 2019-09-20 (×6): 2 [IU] via SUBCUTANEOUS
  Administered 2019-09-21 (×3): 4 [IU] via SUBCUTANEOUS
  Administered 2019-09-21: 8 [IU] via SUBCUTANEOUS
  Administered 2019-09-21: 12 [IU] via SUBCUTANEOUS
  Administered 2019-09-21: 2 [IU] via SUBCUTANEOUS
  Administered 2019-09-22: 4 [IU] via SUBCUTANEOUS
  Administered 2019-09-22: 8 [IU] via SUBCUTANEOUS
  Administered 2019-09-22: 4 [IU] via SUBCUTANEOUS
  Administered 2019-09-22: 8 [IU] via SUBCUTANEOUS
  Administered 2019-09-22 – 2019-09-23 (×5): 4 [IU] via SUBCUTANEOUS
  Administered 2019-09-23 – 2019-09-25 (×6): 2 [IU] via SUBCUTANEOUS

## 2019-08-04 MED ORDER — POTASSIUM CHLORIDE 10 MEQ/50ML IV SOLN
10.0000 meq | INTRAVENOUS | Status: AC
  Administered 2019-08-04 (×6): 10 meq via INTRAVENOUS
  Filled 2019-08-04 (×6): qty 50

## 2019-08-04 MED ORDER — VANCOMYCIN HCL IN DEXTROSE 1-5 GM/200ML-% IV SOLN
1000.0000 mg | Freq: Two times a day (BID) | INTRAVENOUS | Status: DC
  Administered 2019-08-04 – 2019-08-08 (×8): 1000 mg via INTRAVENOUS
  Filled 2019-08-04 (×8): qty 200

## 2019-08-04 MED ORDER — PRO-STAT SUGAR FREE PO LIQD
30.0000 mL | Freq: Two times a day (BID) | ORAL | Status: DC
  Administered 2019-08-04 – 2019-08-08 (×8): 30 mL
  Filled 2019-08-04 (×8): qty 30

## 2019-08-04 MED ORDER — FUROSEMIDE 10 MG/ML IJ SOLN
40.0000 mg | Freq: Two times a day (BID) | INTRAMUSCULAR | Status: DC
  Administered 2019-08-04 – 2019-08-07 (×5): 40 mg via INTRAVENOUS
  Filled 2019-08-04 (×5): qty 4

## 2019-08-04 MED ORDER — HEPARIN (PORCINE) 25000 UT/250ML-% IV SOLN
1000.0000 [IU]/h | INTRAVENOUS | Status: DC
  Administered 2019-08-04: 500 [IU]/h via INTRAVENOUS
  Administered 2019-08-06: 900 [IU]/h via INTRAVENOUS
  Filled 2019-08-04: qty 250

## 2019-08-04 MED ORDER — AMIODARONE HCL IN DEXTROSE 360-4.14 MG/200ML-% IV SOLN
30.0000 mg/h | INTRAVENOUS | Status: DC
  Administered 2019-08-04 – 2019-08-10 (×14): 30 mg/h via INTRAVENOUS
  Filled 2019-08-04: qty 400
  Filled 2019-08-04 (×12): qty 200

## 2019-08-04 MED ORDER — PIVOT 1.5 CAL PO LIQD
1000.0000 mL | ORAL | Status: DC
  Administered 2019-08-04 – 2019-08-11 (×4): 1000 mL
  Filled 2019-08-04 (×10): qty 1000

## 2019-08-04 MED ORDER — VANCOMYCIN HCL 2000 MG/400ML IV SOLN
2000.0000 mg | Freq: Once | INTRAVENOUS | Status: AC
  Administered 2019-08-04: 2000 mg via INTRAVENOUS
  Filled 2019-08-04: qty 400

## 2019-08-04 MED ORDER — ROCURONIUM BROMIDE 50 MG/5ML IV SOLN
100.0000 mg | Freq: Once | INTRAVENOUS | Status: AC
  Filled 2019-08-04: qty 10

## 2019-08-04 MED ORDER — MAGNESIUM SULFATE 4 GM/100ML IV SOLN
4.0000 g | Freq: Once | INTRAVENOUS | Status: AC
  Administered 2019-08-04: 4 g via INTRAVENOUS
  Filled 2019-08-04: qty 100

## 2019-08-04 MED ORDER — EPINEPHRINE HCL 5 MG/250ML IV SOLN IN NS
0.5000 ug/min | INTRAVENOUS | Status: DC
  Administered 2019-08-04: 4 ug/min via INTRAVENOUS
  Administered 2019-08-07: 6 ug/min via INTRAVENOUS
  Administered 2019-08-07: 5 ug/min via INTRAVENOUS
  Administered 2019-08-07: 5.5 ug/min via INTRAVENOUS
  Administered 2019-08-08: 10 ug/min via INTRAVENOUS
  Administered 2019-08-08: 6 ug/min via INTRAVENOUS
  Administered 2019-08-08 – 2019-08-09 (×2): 10 ug/min via INTRAVENOUS
  Administered 2019-08-09: 2 ug/min via INTRAVENOUS
  Administered 2019-08-10 (×2): 4 ug/min via INTRAVENOUS
  Administered 2019-08-11: 3 ug/min via INTRAVENOUS
  Administered 2019-08-11: 4 ug/min via INTRAVENOUS
  Administered 2019-08-12 – 2019-08-16 (×5): 3 ug/min via INTRAVENOUS
  Administered 2019-08-17: 2 ug/min via INTRAVENOUS
  Administered 2019-08-18: 3 ug/min via INTRAVENOUS
  Administered 2019-08-19: 2 ug/min via INTRAVENOUS
  Filled 2019-08-04 (×21): qty 250

## 2019-08-04 MED ORDER — SODIUM CHLORIDE 0.9% IV SOLUTION: Freq: Once | INTRAVENOUS | Status: AC

## 2019-08-04 MED ORDER — SODIUM CHLORIDE 0.9 % IV SOLN
1.0000 g | Freq: Three times a day (TID) | INTRAVENOUS | Status: AC
  Administered 2019-08-04 – 2019-08-12 (×27): 1 g via INTRAVENOUS
  Filled 2019-08-04: qty 0.03
  Filled 2019-08-04 (×7): qty 1
  Filled 2019-08-04: qty 0.03
  Filled 2019-08-04 (×2): qty 1
  Filled 2019-08-04: qty 0.03
  Filled 2019-08-04: qty 1
  Filled 2019-08-04: qty 0.03
  Filled 2019-08-04 (×11): qty 1
  Filled 2019-08-04: qty 0.03
  Filled 2019-08-04 (×2): qty 1

## 2019-08-04 MED ORDER — ALBUMIN HUMAN 5 % IV SOLN
12.5000 g | Freq: Once | INTRAVENOUS | Status: AC
  Administered 2019-08-04: 12.5 g via INTRAVENOUS
  Filled 2019-08-04: qty 250

## 2019-08-04 MED ORDER — VITAL HIGH PROTEIN PO LIQD: 1000.0000 mL | ORAL | Status: DC

## 2019-08-04 MED ORDER — ROCURONIUM BROMIDE 10 MG/ML (PF) SYRINGE
PREFILLED_SYRINGE | INTRAVENOUS | Status: AC
  Administered 2019-08-04: 100 mg
  Filled 2019-08-04: qty 10

## 2019-08-04 NOTE — Progress Notes (Signed)
Pt having increased amounts of bloody drainage on esmark/ioban dressing. Dr. Vickey Sages made aware and orders received to stop hep gtt and restart at 500units if delta P gets to 20. Delta P currently 17 with ecmo flows 4.17. Circuit stable with no fibrin/clots visible. Orders also received for 2 units pRBC and 1 plt. Last hgb/hct 7.4/21.3 and plt 81. CVP 9, Map 73 on 6 epi, 28 levo.   Will notify MD with any changes.

## 2019-08-04 NOTE — Op Note (Signed)
NAMEKERMIT, ARNETTE MEDICAL RECORD [id number] ACCOUNT [id number] DATE OF BIRTH:10-Dec-1955 FACILITY: [facility] LOCATION: [facility] PHYSICIAN:Taylar Hartsough C. Jayko Voorhees, MD  OPERATIVE REPORT  DATE OF PROCEDURE:  08/03/2019  PREOPERATIVE DIAGNOSIS:  Cardiac arrest, coronary artery bypass grafting.  POSTOPERATIVE DIAGNOSIS:  Cardiac arrest, coronary artery bypass grafting.  PROCEDURE:  Cannulation for extracorporeal membrane oxygenation.  SURGEON:  Charlett Lango, MD  ASSISTANT:  Tressie Stalker, MD.   ANESTHESIA:  General.  FINDINGS:  Severe left ventricular dysfunction.  All grafts had good Doppler signal.  Good flows with ECMO.  CLINICAL NOTE:  Mr. Pryer is a 64 year old gentleman who had undergone coronary artery bypass grafting x4 on 08/02/2019.  His initial postoperative course was unremarkable.  On the morning of 08/03/2019, he had a sudden VT/VF cardiac arrest requiring  CPR and ACLS measures. Initially he failed to respond.  Dr. Cornelius Moras opened the patient's chest for open cardiac massage and internal defibrillation.  I arrived and assisted with that and we had return of spontaneous circulation and the patient was transported to the  operating room emergently for further management.  Due to the emergent nature of the procedure, we were unable to discuss with the family.  The nursing staff did notify the family.  DESCRIPTION OF PROCEDURE:  Mr. Spitler was brought emergently to the operating room on 08/03/2019.  He was hemodynamically relatively stable, but on high doses of pressors.  The chest, abdomen and legs were prepped and draped in a sterile fashion.   Intravenous antibiotics were administered.  Timeout was performed.  A sternal retractor was placed.  The grafts were inspected.  All the grafts had a good Doppler signal.  The patient was given 5000 units of heparin.  The decision was made to place him on [place] ECMO.  The aorta was cannulated via concentric 2-0 Ethibond pledgeted pursestring  sutures.  A 23/25 [place] dual stage venous cannula was placed percutaneously through the right femoral vein and positioned in the right atrium with the tip in the  superior vena cava.  Cannulas were de-aired and connected to the ECMO circuit, and VA ECMO was initiated with flows in the 3.5-4 liters per minute range.  There was good pulsatility.  Echocardiography showed a less distended ventricle and a good right  ventricular function.  The chest was copiously irrigated and inspection was made for hemostasis.  There appeared to be good hemostasis at the cannulation site.  The ECMO lines were secured with multiple sutures and line holders.  Esmarch was used to  cover the open sternal wound and was sewn in place with running 2-0 Prolene sutures.  The patient was observed in the operating room.  There was some bleeding. Blood and FFP were both administered during the resuscitation attempt.  The decision was made  to transport the patient to the ICU for further resuscitation.  He was transported intubated and in critical condition.  PN/NUANCE  D:08/03/2019 T:08/04/2019 JOB:[id number]

## 2019-08-04 NOTE — Progress Notes (Signed)
Pharmacy Antibiotic Note  Todd [name] is a 64 y.o. male admitted on 07/26/2019 with SOB, s/p CABG on 6/24, arrest, ECMO placement on 6/25 requiring operative procedure at bedside x 2.  Pharmacy consulted for vancomycin and meropenem dosing for prophylaxis.  WBC trending down, afebrile.  Scr improved this AM, est CrCl ~ 70 ml/min.  Plan: Vancomycin 2g IV x 1 load, then 1g q 12 hrs. Meropenem 1g q 8 hrs. F/u clinical course, hopefully can deescalate quickly.  Height: 5\' 9"  (175.3 cm) (per history) Weight: 87 kg (191 lb 12.8 oz) IBW/kg (Calculated) : 70.7  Temp (24hrs), Avg:97.6 F (36.4 C), Min:95.9 F (35.5 C), Max:98.2 F (36.8 C)  Recent Labs  Lab 08/02/19 2145 08/02/19 2145 08/03/19 0420 08/03/19 0420 08/03/19 0950 08/03/19 1308 08/03/19 1616 08/03/19 1941 08/04/19 0513  WBC 16.5*   < > 13.0*   < > 11.9* 9.1 7.9 7.9 9.3  CREATININE 1.09  --  1.23  --  1.38*  --  1.38*  --  1.16  LATICACIDVEN  --   --   --   --  9.1*  --  8.2* 4.8* 1.3   < > = values in this interval not displayed.    Estimated Creatinine Clearance: 69.3 mL/min (by C-G formula based on SCr of 1.16 mg/dL).    Allergies  Allergen Reactions  . Empagliflozin     Other reaction(s): diarrhea  . Other     Other reaction(s): Unknown  . Simvastatin     Other reaction(s): diarrhea  . Sitagliptin     Other reaction(s): diarrhea/abd pain    Antimicrobials this admission: Cefuroxime 6/23 > 6/25 Vancomycin 6/24 > 6/24; resume 6/26  Dose adjustments this admission:  Microbiology results: None  Thank you for allowing pharmacy to be a part of this patient's care.   7/26, Reece Leader, BCCP Clinical Pharmacist  08/04/2019 7:40 AM   [facility] pharmacy phone numbers are listed on [email or web address]

## 2019-08-04 NOTE — Progress Notes (Signed)
PT Cancellation Note  Patient Details Name: Todd [name] MRN: [id number] DOB: 1955/12/14   Cancelled Treatment:    Reason Eval/Treat Not Completed: Patient not medically ready. Pt's chest re-opened today, required 2 units PRBC and new chest tube inserted. PT will continue to f/u with pt acutely and await medical appropriateness.    Alessandra Bevels Lesa Vandall 08/04/2019, 12:55 PM

## 2019-08-04 NOTE — Procedures (Signed)
Preprocedure diagnosis: Status post institution of VA ECMO with mediastinal bleeding Post procedure diagnosis: Same Procedure: Mediastinal exploration and irrigation Surgeon: Vickey Sages Anesthesia: General Description of procedure: The patient was explored at the bedside in urgent fashion due to expanding hematoma.  His hemodynamics had remained relatively stable however.  The existing dressing was removed from the anterior chest.  The anterior chest was cleansed and draped sterilely with Betadine solution.  The existing Esmark dressing was removed.  A large hematoma was encountered in the anterior mediastinum.  This was completely evacuated.  Copious irrigation was undertaken.  Topical hemostatic agents were applied.  A chest tube was inserted in the left pleural space.  This left 2 chest tubes in the mediastinum and 1 in each pleural space.  A spacer was cut to fit the sternal defect to keep the sternal edges from impinging on the heart.  The wound was then covered with Esmark suture to the skin and then covered with an Ioban drape.  This concluded the procedure which the patient tolerated well.  Loula Marcella Z. Vickey Sages, MD [phone]

## 2019-08-04 NOTE — Progress Notes (Signed)
NAME:  Todd [name], MRN:  [id number], DOB:  September 21, 1955, LOS: 9 ADMISSION DATE:  07/26/2019, CONSULTATION DATE:  08/03/19 REFERRING MD:  Roxan Hockey, CHIEF COMPLAINT:  ECMO   Brief History   VA ECMO for post CABG Vtach arrest  History of present illness   Presented with worsening dyspnea 6/17 c/w CHF exacerbation Cath with severe triple vessel disease Underwent CABG 6/24 Early this AM Vtach arrest Chest opened bedside and cardiac massage initiated as well as multiple cardioversions, amiodarone, bicarb etc Brought to OR and cannulated for VA ECMO PCCM consulted to assist with management Comorbidities include DM, heavy smoking, COPD  Past Medical History  Depression Ischemic cardiomyopathy HTN [facility] Events   6/24 CABG 6/25 VA cannulation  Consults:  CHF, PCCM, TCTS  Procedures:  See below  Significant Diagnostic Tests:  6/22 spirometry with restrictive physiology, preserved FEV1/FVC CXR today with bilateral airspace disease, cannulas in good position, deep left sulcus that was present yesterday, no clear PTX  Micro Data:  COVID and HIV Neg  Antimicrobials:  Vanc>> Cefuroxime>>   Interim history/subjective:  Opened up yesterday afternoon and cleaned out, additional sutures added to arterial cannulation site, thought this was source of high mediastinal drain output.  Labs improved on ECMO.  Objective   Blood pressure 99/67, pulse 70, temperature 98.1 F (36.7 C), resp. rate 12, height 5\' 9"  (1.753 m), weight 87 kg, SpO2 100 %. PAP: (8-33)/(5-22) 29/22 CVP:  [0 mmHg-11 mmHg] 11 mmHg CO:  [2.7 L/min-4.1 L/min] 2.7 L/min CI:  [1.4 L/min/m2-2.2 L/min/m2] 1.4 L/min/m2  Vent Mode: PRVC FiO2 (%):  [40 %-50 %] 40 % Set Rate:  [12 bmp] 12 bmp Vt Set:  [550 mL-560 mL] 560 mL PEEP:  [5 cmH20] 5 cmH20 Pressure Support:  [10 cmH20] 10 cmH20 Plateau Pressure:  [17 cmH20-23 cmH20] 23 cmH20   Intake/Output Summary (Last 24 hours) at 08/04/2019 2025 Last  data filed at 08/04/2019 0700 Gross per 24 hour  Intake 13530.03 ml  Output 7290 ml  Net 6240.03 ml   Filed Weights   08/01/19 0500 08/02/19 0419 08/04/19 0600  Weight: 73.8 kg 73.3 kg 87 kg    Examination: General: ill appearing man on vent HENT: ett in place, minimal secretions Lungs: rhonci bilaterally Cardiovascular: chest open, dressings are soaked through with blood, ext lukewarm, ongoing blood loss from mediastinal drains Abdomen: Soft, hypoactive bs Extremities: diffuse edema Neuro: heavily sedated but triggering vent GU: foley in place with dark urine Skin: sternotomy site oozing blood  Labs pending  Centrally cannulated arterially, R femoral venous drainage catheter Flows 4LPM, circuit pressures okay Sweep 2, vent waveforms benign Foley, mediastinal drains, OGT in place RIJ MML   ABG looks good CXR mild edema Lactate 4.8>>1.3 Fibrinogen okay INR 1.5 PTT 43 Cr stable Weight up a good bit  Resolved Hospital Problem list   N/A  Assessment & Plan:  S/P VA ECMO cannulation after Vtach arrest in setting of recent CABG. - Heparin on hold for bleeding issues - Titrate down pressors as able - Monitor circuit pressures - Trend usual indices: lactate, Uop: okay for now - Amiodarone per CHF - Would do broad spectrum abx for now given number of times has needed bedside exploration  Hemorrhagic shock - Management per TCTS, transfusions as needed, avoid hypothermia, acidemia, coagulopathy as able  On mechanical ventilation for above - Lung protective tidal volumes - Adjust sweep for pH ~7.35-7.7.45 - Heavy sedation with open chest for now  Ischemic cardiomyopathy - Management  per CHF team  DM with hyperglycemia - Insulin endotool  Best practice:  Diet: can do trickle feeds Pain/Anxiety/Delirium protocol (if indicated): fentanyl, propofol, precedex VAP protocol (if indicated): in place DVT prophylaxis: SCDs with eventual plan for heparin GI prophylaxis:  PPI Glucose control: see above Mobility: BR Code Status: Full Family Communication: per primary Disposition:  ICU  The patient is critically ill with multiple organ systems failure and requires high complexity decision making for assessment and support, frequent evaluation and titration of therapies, application of advanced monitoring technologies and extensive interpretation of multiple databases. Critical Care Time devoted to patient care services described in this note independent of APP/resident time (if applicable)  is 34 minutes.   Myrla Halsted MD [facility] 08/04/2019 7:30 AM Personal pager: [phone] If unanswered, please page CCM On-call: #[phone]

## 2019-08-04 NOTE — Progress Notes (Signed)
Patient ID: Todd [name], male   DOB: [birth year], 64 y.o.   MRN: [id number]     Advanced Heart Failure Rounding Note  PCP-Cardiologist: No primary care provider on file.   Subjective:    - CABG 6/24 - VF arrest early am 6/25, cannulated for VA ECMO.  Return to OR for mediastinal bleeding.   Ongoing mediastinal bleeding with blood products overnight. Weight up 30 lbs.  Heparin started transiently then stopped.  Good flow through ECMO circuit.  CXR with pulmonary edema. ABG 7.48/34/244 FiO2 0.4.  MAP 70s on epinephrine 5.5, milrinone 0.25, NE 18.  Lactate 1.3.   Hgb 8.1, plts 93  With open chest, he is on empiric vancomycin and meropenem.   Sedated, pupils reactive.   Alternating between NSR and what appears to be junctional rhythm rate 60s.  On amiodarone 60 mg/hr.   Swan: CVP 11 PA 24/18 Co-ox 79/6%  ECMO: 3300 rpm Flow 4.2 L/min -51 deltaP 18 Sweep 1.5  Objective:   Weight Range: 87 kg Body mass index is 28.32 kg/m.   Vital Signs:   Temp:  [95.9 F (35.5 C)-98.2 F (36.8 C)] 98.1 F (36.7 C) (06/26 0700) Pulse Rate:  [70-108] 70 (06/26 0718) Resp:  [0-36] 12 (06/26 0718) BP: (96-113)/(66-73) 99/67 (06/26 0353) SpO2:  [100 %] 100 % (06/26 0700) Arterial Line BP: (74-138)/(62-110) 99/69 (06/26 0700) FiO2 (%):  [40 %-50 %] 40 % (06/26 0718) Weight:  [87 kg] 87 kg (06/26 0600) Last BM Date: 07/29/19  Weight change: Filed Weights   08/01/19 0500 08/02/19 0419 08/04/19 0600  Weight: 73.8 kg 73.3 kg 87 kg    Intake/Output:   Intake/Output Summary (Last 24 hours) at 08/04/2019 0731 Last data filed at 08/04/2019 0700 Gross per 24 hour  Intake 13280.03 ml  Output 7290 ml  Net 5990.03 ml      Physical Exam    General:  Sedated on vent.  HEENT: Normal Neck: Supple. JVP 10. Carotids 2+ bilat; no bruits. No lymphadenopathy or thyromegaly appreciated. Cor: Chest open with ongoing oozing. Regular rate & rhythm. No rubs, gallops or murmurs. Lungs: Decreased at  bases.  Abdomen: Soft, nontender, nondistended. No hepatosplenomegaly. No bruits or masses. Good bowel sounds. Extremities: No cyanosis, clubbing, rash, edema Neuro: Sedated. Pupils reactive.   Telemetry   NSR alternating with junctional rhythm rate 60s (personally reviewed)  Labs    CBC Recent Labs    08/03/19 1941 08/03/19 1950 08/04/19 0513 08/04/19 0527  WBC 7.9  --  9.3  --   HGB 7.4*   < > 8.1* 7.5*  HCT 21.3*   < > 23.2* 22.0*  MCV 83.9  --  85.3  --   PLT 81*  --  93*  --    < > = values in this interval not displayed.   Basic Metabolic Panel Recent Labs    08/03/19 1616 08/03/19 1829 08/04/19 0513 08/04/19 0527  NA 141   < > 143 144  K 3.3*   < > 3.8 3.7  CL 105  --  107  --   CO2 20*  --  26  --   GLUCOSE 261*  --  129*  --   BUN 22  --  23  --   CREATININE 1.38*  --  1.16  --   CALCIUM 8.0*  --  7.2*  --   MG 2.1  --  1.7  --    < > = values in this interval not displayed.  Liver Function Tests Recent Labs    08/03/19 0950 08/04/19 0513  AST 40 42*  ALT 18 18  ALKPHOS 32* 30*  BILITOT 2.5* 2.7*  PROT 3.5* 3.6*  ALBUMIN 2.4* 2.2*   No results for input(s): LIPASE, AMYLASE in the last 72 hours. Cardiac Enzymes No results for input(s): CKTOTAL, CKMB, CKMBINDEX, TROPONINI in the last 72 hours.  BNP: BNP (last 3 results) Recent Labs    07/26/19 1236  BNP 2,568.2*    ProBNP (last 3 results) No results for input(s): PROBNP in the last 8760 hours.   D-Dimer Recent Labs    08/03/19 0950 08/03/19 1309  DDIMER 3.48* 2.06*   Hemoglobin A1C No results for input(s): HGBA1C in the last 72 hours. Fasting Lipid Panel Recent Labs    08/03/19 1309  TRIG 56   Thyroid Function Tests No results for input(s): TSH, T4TOTAL, T3FREE, THYROIDAB in the last 72 hours.  Invalid input(s): FREET3  Other results:   Imaging    DG Chest 1 View  Result Date: 08/03/2019 CLINICAL DATA:  Status post emergent ECMO and CABG. Rule out foreign  bodies in pneumothorax. EXAM: CHEST  1 VIEW COMPARISON:  Earlier today FINDINGS: Support apparatus including: Endotracheal tube, ECMO device, mediastinal drain, bilateral chest tubes, and nasogastric tube identified. No pneumothorax identified. Heart size is mildly enlarged. Diffuse bilateral interstitial opacities compatible with pulmonary edema, increased from previous exam. IMPRESSION: 1. Bilateral chest tubes in place without evidence for pneumothorax. 2. Increase in pulmonary edema. Electronically Signed   By: Kerby Moors M.D.   On: 08/03/2019 09:03   DG Chest Port 1 View  Result Date: 08/03/2019 CLINICAL DATA:  Status post ECMO and CABG EXAM: PORTABLE CHEST 1 VIEW COMPARISON:  Chest radiograph from earlier today. FINDINGS: Endotracheal tube tip is 3.7 cm above the carina. Enteric tube enters stomach with the tip not seen on this image. Right internal jugular Swan-Ganz catheter terminates over the right pulmonary artery. Inferior approach mediastinal drain terminates over midline upper mediastinum. Stable position of ECMO catheters overlying the upper mediastinum. Stable bilateral chest tube position. Pacer pad overlies the left chest. Stable cardiomediastinal silhouette with mild cardio. No pneumothorax. No pleural effusion. Moderate to severe pulmonary edema appears unchanged. Stable left basilar atelectasis. IMPRESSION: 1. Well-positioned support structures.  No pneumothorax. 2. Stable mild cardiomegaly and moderate to severe pulmonary edema. 3. Stable left basilar atelectasis. Electronically Signed   By: Ilona Sorrel M.D.   On: 08/03/2019 10:03      Medications:     Scheduled Medications: . sodium chloride   Intravenous Once  . sodium chloride   Intravenous Once  . sodium chloride   Intravenous Once  . sodium chloride   Intravenous Once  . acetaminophen  1,000 mg Oral Q6H   Or  . acetaminophen (TYLENOL) oral liquid 160 mg/5 mL  1,000 mg Per Tube Q6H  . artificial tears   Both Eyes Q8H   . aspirin EC  325 mg Oral Daily   Or  . aspirin  324 mg Per Tube Daily  . bisacodyl  10 mg Oral Daily   Or  . bisacodyl  10 mg Rectal Daily  . chlorhexidine gluconate (MEDLINE KIT)  15 mL Mouth Rinse BID  . Chlorhexidine Gluconate Cloth  6 each Topical Daily  . citalopram  20 mg Per Tube Daily  . docusate  200 mg Per Tube Daily  . feeding supplement (VITAL HIGH PROTEIN)  1,000 mL Per Tube Q24H  . fentaNYL (SUBLIMAZE) injection  50 mcg Intravenous Once  . furosemide  40 mg Intravenous BID  . mouth rinse  15 mL Mouth Rinse 10 times per day  . metoCLOPramide (REGLAN) injection  10 mg Intravenous Q6H  . metoprolol tartrate  12.5 mg Oral BID   Or  . metoprolol tartrate  12.5 mg Per Tube BID  . nicotine  21 mg Transdermal Daily  . pantoprazole (PROTONIX) IV  40 mg Intravenous QHS  . rosuvastatin  20 mg Per Tube Daily  . sodium chloride flush  10-40 mL Intracatheter Q12H  . sodium chloride flush  3 mL Intravenous Q12H  . umeclidinium bromide  1 puff Inhalation Daily     Infusions: . sodium chloride 10 mL/hr at 08/03/19 1424  . sodium chloride 250 mL (08/03/19 2118)  . sodium chloride Stopped (08/03/19 1800)  . sodium chloride    . albumin human 12.5 g (08/03/19 1623)  . amiodarone    . dexmedetomidine (PRECEDEX) IV infusion 0.7 mcg/kg/hr (08/04/19 0700)  . dextrose 5 % and 0.45% NaCl    . dextrose 5 % and 0.45% NaCl    . EPINEPHrine 4 mg in dextrose 5% 250 mL infusion (16 mcg/mL) 5.5 mcg/min (08/04/19 0700)  . fentaNYL infusion INTRAVENOUS 125 mcg/hr (08/04/19 0700)  . heparin Stopped (08/04/19 0004)  . insulin 1.5 mL/hr at 08/04/19 0700  . lactated ringers 500 mL/hr at 08/02/19 1720  . lactated ringers 20 mL/hr at 08/02/19 1637  . lactated ringers 20 mL/hr at 08/04/19 0700  . magnesium sulfate bolus IVPB    . meropenem (MERREM) IV    . milrinone 0.25 mcg/kg/min (08/04/19 0700)  . nitroGLYCERIN Stopped (08/03/19 0160)  . norepinephrine (LEVOPHED) Adult infusion 18 mcg/min  (08/04/19 0700)  . phenylephrine (NEO-SYNEPHRINE) Adult infusion 0 mcg/min (08/03/19 0900)  . potassium chloride 50 mL/hr at 08/04/19 0700  . propofol (DIPRIVAN) infusion 35 mcg/kg/min (08/04/19 0700)  . vancomycin    . vancomycin    . vasopressin (PITRESSIN) infusion - *FOR SHOCK*       PRN Medications:  sodium chloride, albumin human, dextrose, dextrose, fentaNYL, lactated ringers, metoprolol tartrate, midazolam, morphine injection, ondansetron (ZOFRAN) IV, sodium chloride flush, sodium chloride flush   Assessment/Plan   1. CAD: 3VD s/p CABG x 4 with LIMA-LAD, SVG-D1, SVG-PDA, and radial-OM1 on 6/24.   - Continue ASA and Crestor per tube.  2. Cardiac arrest: VF arrest with prolonged code, VA ECMO begun.  - Continue IV amiodarone, can decrease to 30 mg/hr.   - Eventual evaluation for ICD.  - Would consider eventual cardiac cath to assess patency of grafts.  3. Cardiogenic shock: Echo with EF 20-25%, moderate RV dysfunction.  Now on [place] ECMO post-VF arrest, cannulated 6/25.  Stable s/p return to OR for mediastinal re-exploration due to bleeding on 6/25.  Currently on NE 18, epinephrine 5.5, milrinone 0.25 with co-ox 79.6%. Creatinine 1.16, lactate 1.3.  Weight up 30 lbs with multiple blood products. Chest remains open, still appears to have significant mediastinal bleeding. ECMO circuit stable flow, off heparin with mediastinal bleeding but deltaP ok. Currently stable MAP with pulsatile flow.  - ?Need to return to OR again for mediastinal bleeding, discuss with TCTS.  - No heparin for now due to bleeding.   - Wean pressors as tolerated.  - Diuresis, Lasix 40 mg IV bid and follow response.  4. Acute hypoxemic respiratory failure: In setting of cardiac arrest.  H/o COPD.  Pulmonary edema on CXR.  Stable with sweep 1.5. CCM managing.   -  Diuresis as above.  5. Anemia: Now s/p mediastinal re-exploration but still appears to have significant bleeding.   - Transfuse hgb < 8.  6.  Thrombocytopenia: Platelets better at 93K but has had transfusion.  Follow closely.   7. Rhythm: Junctional rhythm alternating with NSR.  Will get ECG this morning.  8. ID: Chest open, empirically covering with meropenem and vancomycin.  Afebrile.   CRITICAL CARE Performed by: Loralie Champagne  Total critical care time: 40 minutes  Critical care time was exclusive of separately billable procedures and treating other patients.  Critical care was necessary to treat or prevent imminent or life-threatening deterioration.  Critical care was time spent personally by me on the following activities: development of treatment plan with patient and/or surrogate as well as nursing, discussions with consultants, evaluation of patient's response to treatment, examination of patient, obtaining history from patient or surrogate, ordering and performing treatments and interventions, ordering and review of laboratory studies, ordering and review of radiographic studies, pulse oximetry and re-evaluation of patient's condition.   Length of Stay: 9  Loralie Champagne, MD  08/04/2019, 7:31 AM  Advanced Heart Failure Team Pager [phone] (M-F; 7a - 4p)  Please contact [facility] for night-coverage after hours (4p -7a ) and weekends on [email or web address]

## 2019-08-04 NOTE — Progress Notes (Signed)
Atkins MD opened patient's chest at bedside. ECMO specialist and two RNs present for procedure. 2 units pRBCs given. Fentanyl bolused from bag and rocuronium given during procedure. New chest tube inserted by physician. VSS.

## 2019-08-04 NOTE — Progress Notes (Signed)
ANTICOAGULATION CONSULT NOTE - [place] for heparin  Indication: VA ECMO  Allergies  Allergen Reactions  . Empagliflozin     Other reaction(s): diarrhea  . Other     Other reaction(s): Unknown  . Simvastatin     Other reaction(s): diarrhea  . Sitagliptin     Other reaction(s): diarrhea/abd pain    Patient Measurements: Height: 5\' 9"  (175.3 cm) (per history) Weight: 73.3 kg (161 lb 9.6 oz) IBW/kg (Calculated) : 70.7 Heparin Dosing Weight: 73kg  Vital Signs: Temp: 98.1 F (36.7 C) (06/26 0024) Temp Source: Core (06/25 2100) BP: 99/68 (06/25 2347) Pulse Rate: 72 (06/25 2347)  Labs: Recent Labs    08/02/19 1549 08/02/19 2130 08/03/19 0420 08/03/19 0420 08/03/19 0950 08/03/19 0955 08/03/19 1309 08/03/19 1515 08/03/19 1616 08/03/19 1616 08/03/19 1829 08/03/19 1829 08/03/19 1941 08/03/19 1950 08/03/19 2132  HGB 10.5*  9.8*   < > 8.7*   < > 7.7*   < >  --    < > 9.0*   < > 7.1*   < > 7.4* 6.8*  --   HCT 31.0*  31.0*   < > 27.0*   < > 23.4*   < >  --    < > 26.2*   < > 21.0*  --  21.3* 20.0*  --   PLT 128*   < > 112*   < > 71*  70*   < > 44*  --  57*  --   --   --  81*  --   --   APTT 39*  --   --   --  98*   < > 44*  --  43*  --   --   --   --   --  89*  LABPROT 19.1*  --   --   --  21.6*  --  18.7*  --   --   --   --   --   --   --   --   INR 1.7*  --   --   --  2.0*  --  1.6*  --   --   --   --   --   --   --   --   HEPARINUNFRC  --   --   --   --   --   --  <0.10*  --   --   --   --   --   --   --   --   CREATININE  --    < > 1.23  --  1.38*  --   --   --  1.38*  --   --   --   --   --   --    < > = values in this interval not displayed.    Estimated Creatinine Clearance: 53.4 mL/min (A) (by C-G formula based on SCr of 1.38 mg/dL (H)).  Assessment: 64 year old male s/p CABG on 6/24, subsequently developed VT arrest this am underwent emergency bedside median sternotomy then taken to OR for VA ECMO cannulation. Patient then  decompensated again in ICU, concern for ongoing bleeding and patient again underwent bedside sternotomy. Patient currently stable post multiple transfusions.   Plan was to begin heparin but pt remains with significant wound bleeding per RN. Dr. Orvan Seen notified and plan to hold heparin for now unless circuit appears to be clotting. Orvil Feil (RN) will notify pharmacy if heparin starts.  Goal of Therapy:  Heparin level 0.3-0.5 units/ml Monitor platelets by anticoagulation protocol: Yes   Plan:  F/u bleeding and ability to start heparin  Thank you for allowing pharmacy to be a part of this patient's care.  Christoper Fabian, PharmD, BCPS Please see amion for complete clinical pharmacist phone list 08/04/2019 12:30 AM

## 2019-08-04 NOTE — Progress Notes (Signed)
Pt moves hand and feet during mouth care.  Could not get him to follow commands at this time.    Lonia Blood, RN

## 2019-08-04 NOTE — Op Note (Signed)
NAMEOJAS, COONE MEDICAL RECORD WL:[id number] ACCOUNT [id number] DATE OF BIRTH:04-10-1955 FACILITY: [facility] LOCATION: [facility] PHYSICIAN:Josette Shimabukuro Lars Pinks, MD  OPERATIVE REPORT  DATE OF PROCEDURE:  08/03/2019  PREOPERATIVE DIAGNOSIS:  Bleeding following ECMO cannulation.  POSTOPERATIVE DIAGNOSIS:  Bleeding following ECMO cannulation.  PROCEDURE:  Reexploration for bleeding.  SURGEON:  Charlett Lango, MD  ASSISTANT:  Virgilio Frees, RNFA.  ANESTHESIA:  General.  FINDINGS:  Bleeding at aortic cannulation site. Good control of bleeding after additional suture placement and advancing the cannula.  CLINICAL NOTE:  Mr. Atiyeh is a 65 year old gentleman who had a sudden cardiac arrest on postop day 1 following coronary artery bypass grafting.  He had been taken to the operating room for emergency cannulation and institution of VA ECMO.  After  arrival back in the ICU, the patient had a high volume bleeding from his chest tube sites with initial attempts to resuscitate the patient.  He maintained hemodynamic stability initially, but his bleeding worsened and he became hemodynamically unstable.  Decision  was made to reopen the chest in the ICU.  DESCRIPTION OF PROCEDURE:  The chest was prepped with Betadine.  The preexisting dressing was removed and the chest was prepped a second time.  The Esmarch was removed.  There was extensive clot in the anterior mediastinum.  This was cleared.  A sternal  retractor was placed.  There was bleeding noted coming from the aortic cannulation site.  The aortic cannula was advanced slightly which improved the bleeding significantly, but there was still some bleeding around the site and the site was reinforced  with a 2-0 Ethibond suture with pledgets.  This was then tightened with a Rumel tourniquet and there was a markedly improved hemostasis. Evarrest topical hemostat was applied around the cannulation site.  The remainder of the chest was inspected.  No other  bleeding sites  were noted.  All the clot was evacuated and the chest tubes were cleared.  The chest was irrigated with warm saline.  The Evarrest was removed and there was good hemostasis at the cannulation site.  Evarrest was reapplied at that site.  The cannula was  secured with multiple heavy silk sutures.  An Esmarch was sewn over the wound with a running 2-0 Prolene suture.  The patient remained in the ICU, intubated and in critical condition.  PN/NUANCE  D:08/03/2019 T:08/04/2019 JOB:[id number]

## 2019-08-04 NOTE — Progress Notes (Signed)
TCTS Evening Rounds  Day 2 of VA ECMO Stable day after chest washout. Maintaining good flows; low delta P Stable H/H Good UOP A/p: continue present management Brit Wernette Z. Vickey Sages, MD [phone]

## 2019-08-04 NOTE — Progress Notes (Signed)
ANTICOAGULATION CONSULT NOTE - Follow-Up Consult  Pharmacy Consult for heparin > resume 6/26 per Dr. Vickey Sages Indication: VA ECMO  Allergies  Allergen Reactions  . Empagliflozin     Other reaction(s): diarrhea  . Other     Other reaction(s): Unknown  . Simvastatin     Other reaction(s): diarrhea  . Sitagliptin     Other reaction(s): diarrhea/abd pain    Patient Measurements: Height: 5\' 9"  (175.3 cm) (per history) Weight: 87 kg (191 lb 12.8 oz) IBW/kg (Calculated) : 70.7 Heparin Dosing Weight: 73kg  Vital Signs: Temp: 97.9 F (36.6 C) (06/26 2200) Temp Source: Core (Comment) (06/26 2000) BP: 76/54 (06/26 1920) Pulse Rate: 86 (06/26 1920)  Labs: Recent Labs    08/03/19 0950 08/03/19 0955 08/03/19 1309 08/03/19 1515 08/03/19 1616 08/03/19 1829 08/03/19 1941 08/03/19 1950 08/04/19 0513 08/04/19 0527 08/04/19 1103 08/04/19 1108 08/04/19 1700 08/04/19 1700 08/04/19 1702 08/04/19 2100 08/04/19 2115  HGB 7.7*   < >  --    < > 9.0*   < > 7.4*   < > 8.1*   < >  --    < > 9.6*   < > 9.5*  --  10.2*  HCT 23.4*   < >  --    < > 26.2*   < > 21.3*   < > 23.2*   < >  --    < > 27.5*  --  28.0*  --  30.0*  PLT 71*  70*   < > 44*   < > 57*   < > 81*  --  93*  --   --   --  70*  --   --   --   --   APTT 98*   < > 44*   < > 43*  --   --    < > 43*   < > 42*  --  57*  --   --  57*  --   LABPROT 21.6*  --  18.7*  --   --   --   --   --  17.9*  --   --   --   --   --   --   --   --   INR 2.0*  --  1.6*  --   --   --   --   --  1.5*  --   --   --   --   --   --   --   --   HEPARINUNFRC  --   --  <0.10*  --   --   --   --   --   --   --   --   --   --   --   --  <0.10*  --   CREATININE 1.38*   < >  --   --  1.38*  --   --   --  1.16  --   --   --  1.27*  --   --   --   --    < > = values in this interval not displayed.    Estimated Creatinine Clearance: 63.3 mL/min (A) (by C-G formula based on SCr of 1.27 mg/dL (H)).  Assessment: 64 year old male s/p CABG on 6/24, subsequently  developed VT arrest this am underwent emergency bedside median sternotomy then taken to OR for VA ECMO cannulation. Patient then decompensated again in ICU, concern for ongoing bleeding and patient  again underwent bedside sternotomy. Patient currently stable post multiple transfusions.   Heparin drip 500 uts/hr started this afternoon. H/H stable this pm pltc 70 CT output his increased over last few hours 60>100 ml/hr, pump circuit stable, no increase in fibrin in pump over the day per RNs.     HL remains < 0.1, aptt 57sec but with increase in CT outpt will not increase heparin drip rate tonight   Goal of Therapy:  Heparin level 0.3-0.5 units/ml Monitor platelets by anticoagulation protocol: Yes   Plan:  Continue  heparin at 500 units/hr. APTT and heparin level with AML Will need to watch bleeding/oozing carefully.   Bonnita Nasuti Pharm.D. CPP, BCPS Clinical Pharmacist [phone] 08/04/2019 10:38 PM    [facility] pharmacy phone numbers are listed on [email or web address]

## 2019-08-04 NOTE — Progress Notes (Signed)
Nutrition Follow-up  DOCUMENTATION CODES:   Not applicable  INTERVENTION:   Tube feeding:  -Pivot 1.5 @ 20 ml/hr via OG tube -30 ml Prostat BID -When able to advance TF rate, increase by 10 ml Q8 hours to goal rate of 50 ml/hr (1200 ml)  At goal rate TF provides: 2000 kcal (2232 kcal with propofol), 143 grams protein, 900 ml free water.   NUTRITION DIAGNOSIS:   Increased nutrient needs related to post-op healing as evidenced by estimated needs.  Ongoing  GOAL:   Patient will meet greater than or equal to 90% of their needs  Not meeting  MONITOR:   PO intake, Supplement acceptance, Weight trends, Labs, I & O's  REASON FOR ASSESSMENT:   Consult  (EMCO tube feeding recommendations)  ASSESSMENT:   Patient with PMH significant for CHF, COPD, HTN, DM, HLD, MDD, and BPH. Presents this admission with CHF exacerbation.   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 Plans to begin trickle tube feeding today. OG tube in place with tip in the stomach.   Admission weight: 77 kg  Current weight: 87 kg   Patient remains intubated on ventilator support MV: 8.7 L/min Temp (24hrs), Avg:97.7 F (36.5 C), Min:96.4 F (35.8 C), Max:98.2 F (36.8 C)  MAP: 73-88  Propofol: 15.4 ml/hr- provides 407 kcal from lipids daily   I/O: +2,584 ml since admit  UOP: 1,230 ml x 24 hrs  Chest tubes: 5,010 ml x 24 hrs EBL: 700 ml OG output: 350 ml Per flow sheet documentation, patient has moderate pitting edema to BLE and generalized mild pitting edema.   Drips: LR at 20 ml/hr, precedex, epinephrine, insulin, milrinone, levophed, propofol, fentanyl, amiodarone, potassium chloride Medications: dulcolax, colace, Lasix, 10 mg reglan QID, protonix Labs: K up to 3.7 CBG [phone]   Diet Order:   Diet Order            Diet NPO time specified  Diet effective now                 EDUCATION  NEEDS:   Education needs have been addressed  Skin:  Skin Assessment: Skin Integrity Issues: Skin Integrity Issues:: Incisions, Other (Comment) Incisions: R leg, R groin Other: open chest  Last BM:  6/20  Height:   Ht Readings from Last 1 Encounters:  08/01/19 5\' 9"  (1.753 m)    Weight:   Wt Readings from Last 1 Encounters:  08/04/19 87 kg    BMI:  Body mass index is 28.32 kg/m.  Estimated Nutritional Needs:   Kcal:  1820-2184 kcal  Protein:  110-146  Fluid:  >/= 1.8 L/day   08/06/19, RD, LDN, CNSC Contact information in Amion

## 2019-08-04 NOTE — Progress Notes (Signed)
ANTICOAGULATION CONSULT NOTE - Follow-Up Consult  Pharmacy Consult for heparin > resume now per Dr. Vickey Sages Indication: VA ECMO  Allergies  Allergen Reactions  . Empagliflozin     Other reaction(s): diarrhea  . Other     Other reaction(s): Unknown  . Simvastatin     Other reaction(s): diarrhea  . Sitagliptin     Other reaction(s): diarrhea/abd pain    Patient Measurements: Height: 5\' 9"  (175.3 cm) (per history) Weight: 87 kg (191 lb 12.8 oz) IBW/kg (Calculated) : 70.7 Heparin Dosing Weight: 73kg  Vital Signs: Temp: 97.9 F (36.6 C) (06/26 1430) Temp Source: Core (06/26 1200) BP: 99/67 (06/26 0353) Pulse Rate: 82 (06/26 1200)  Labs: Recent Labs    08/03/19 0950 08/03/19 0955 08/03/19 1309 08/03/19 1515 08/03/19 1616 08/03/19 1829 08/03/19 1941 08/03/19 1950 08/03/19 2132 08/04/19 0026 08/04/19 0513 08/04/19 0513 08/04/19 0527 08/04/19 1103 08/04/19 1108  HGB 7.7*   < >  --    < > 9.0*   < > 7.4*   < >  --    < > 8.1*   < > 7.5*  --  8.8*  HCT 23.4*   < >  --    < > 26.2*   < > 21.3*   < >  --    < > 23.2*  --  22.0*  --  26.0*  PLT 71*  70*   < > 44*   < > 57*  --  81*  --   --   --  93*  --   --   --   --   APTT 98*   < > 44*   < > 43*  --   --    < > 89*  --  43*  --   --  42*  --   LABPROT 21.6*  --  18.7*  --   --   --   --   --   --   --  17.9*  --   --   --   --   INR 2.0*  --  1.6*  --   --   --   --   --   --   --  1.5*  --   --   --   --   HEPARINUNFRC  --   --  <0.10*  --   --   --   --   --   --   --   --   --   --   --   --   CREATININE 1.38*  --   --   --  1.38*  --   --   --   --   --  1.16  --   --   --   --    < > = values in this interval not displayed.    Estimated Creatinine Clearance: 69.3 mL/min (by C-G formula based on SCr of 1.16 mg/dL).  Assessment: 64 year old male s/p CABG on 6/24, subsequently developed VT arrest this am underwent emergency bedside median sternotomy then taken to OR for VA ECMO cannulation. Patient then  decompensated again in ICU, concern for ongoing bleeding and patient again underwent bedside sternotomy. Patient currently stable post multiple transfusions.   Plan was to begin heparin but pt remains with significant wound bleeding per RN. Dr. 7/24 notified and plan to hold heparin for now unless circuit appears to be clotting. Vickey Sages Lorelle Formosa) will notify pharmacy if  heparin starts.  PM f/u - aPTT now 42.  Will restart heparin gtt at 500 units/hr per discussion with Dr. Orvan Seen.  Goal of Therapy:  Heparin level 0.3-0.5 units/ml Monitor platelets by anticoagulation protocol: Yes   Plan:  Restart heparin at 500 units/hr. APTT and heparin level in 6 hrs. Will need to watch bleeding/oozing carefully.  Thank you for allowing pharmacy to be a part of this patient's care.  [place] Crane, Roylene Reason, BCCP Clinical Pharmacist  08/04/2019 2:50 PM   [facility] pharmacy phone numbers are listed on [email or web address]

## 2019-08-05 ENCOUNTER — Inpatient Hospital Stay (HOSPITAL_COMMUNITY): Payer: Medicare Other

## 2019-08-05 DIAGNOSIS — J9601 Acute respiratory failure with hypoxia: Secondary | ICD-10-CM

## 2019-08-05 LAB — POCT I-STAT 7, (LYTES, BLD GAS, ICA,H+H)
Acid-Base Excess: 7 mmol/L — ABNORMAL HIGH (ref 0.0–2.0)
Acid-Base Excess: 9 mmol/L — ABNORMAL HIGH (ref 0.0–2.0)
Acid-Base Excess: 10 mmol/L — ABNORMAL HIGH (ref 0.0–2.0)
Acid-Base Excess: 8 mmol/L — ABNORMAL HIGH (ref 0.0–2.0)
Acid-base deficit: 13 mmol/L — ABNORMAL HIGH (ref 0.0–2.0)
Acid-base deficit: 7 mmol/L — ABNORMAL HIGH (ref 0.0–2.0)
Acid-base deficit: 4 mmol/L — ABNORMAL HIGH (ref 0.0–2.0)
Bicarbonate: 16.9 mmol/L — ABNORMAL LOW (ref 20.0–28.0)
Bicarbonate: 20.4 mmol/L (ref 20.0–28.0)
Bicarbonate: 21.4 mmol/L (ref 20.0–28.0)
Bicarbonate: 30.3 mmol/L — ABNORMAL HIGH (ref 20.0–28.0)
Bicarbonate: 32.8 mmol/L — ABNORMAL HIGH (ref 20.0–28.0)
Bicarbonate: 34.1 mmol/L — ABNORMAL HIGH (ref 20.0–28.0)
Bicarbonate: 31.8 mmol/L — ABNORMAL HIGH (ref 20.0–28.0)
Calcium, Ion: 1.05 mmol/L — ABNORMAL LOW (ref 1.15–1.40)
Calcium, Ion: 1.13 mmol/L — ABNORMAL LOW (ref 1.15–1.40)
Calcium, Ion: 0.99 mmol/L — ABNORMAL LOW (ref 1.15–1.40)
Calcium, Ion: 1.07 mmol/L — ABNORMAL LOW (ref 1.15–1.40)
Calcium, Ion: 1.06 mmol/L — ABNORMAL LOW (ref 1.15–1.40)
Calcium, Ion: 1.05 mmol/L — ABNORMAL LOW (ref 1.15–1.40)
Calcium, Ion: 1.05 mmol/L — ABNORMAL LOW (ref 1.15–1.40)
HCT: 25 % — ABNORMAL LOW (ref 39.0–52.0)
HCT: 33 % — ABNORMAL LOW (ref 39.0–52.0)
HCT: 26 % — ABNORMAL LOW (ref 39.0–52.0)
HCT: 32 % — ABNORMAL LOW (ref 39.0–52.0)
HCT: 21 % — ABNORMAL LOW (ref 39.0–52.0)
HCT: 28 % — ABNORMAL LOW (ref 39.0–52.0)
HCT: 27 % — ABNORMAL LOW (ref 39.0–52.0)
Hemoglobin: 11.2 g/dL — ABNORMAL LOW (ref 13.0–17.0)
Hemoglobin: 8.5 g/dL — ABNORMAL LOW (ref 13.0–17.0)
Hemoglobin: 8.8 g/dL — ABNORMAL LOW (ref 13.0–17.0)
Hemoglobin: 10.9 g/dL — ABNORMAL LOW (ref 13.0–17.0)
Hemoglobin: 7.1 g/dL — ABNORMAL LOW (ref 13.0–17.0)
Hemoglobin: 9.5 g/dL — ABNORMAL LOW (ref 13.0–17.0)
Hemoglobin: 9.2 g/dL — ABNORMAL LOW (ref 13.0–17.0)
O2 Saturation: 100 %
O2 Saturation: 94 %
O2 Saturation: 100 %
O2 Saturation: 100 %
O2 Saturation: 100 %
O2 Saturation: 100 %
O2 Saturation: 100 %
Patient temperature: 36.6
Patient temperature: 36.6
Patient temperature: 36.6
Patient temperature: 36.7
Potassium: 3.5 mmol/L (ref 3.5–5.1)
Potassium: 3.8 mmol/L (ref 3.5–5.1)
Potassium: 3.6 mmol/L (ref 3.5–5.1)
Potassium: 4 mmol/L (ref 3.5–5.1)
Potassium: 4.1 mmol/L (ref 3.5–5.1)
Potassium: 3.7 mmol/L (ref 3.5–5.1)
Potassium: 3.7 mmol/L (ref 3.5–5.1)
Sodium: 146 mmol/L — ABNORMAL HIGH (ref 135–145)
Sodium: 146 mmol/L — ABNORMAL HIGH (ref 135–145)
Sodium: 147 mmol/L — ABNORMAL HIGH (ref 135–145)
Sodium: 140 mmol/L (ref 135–145)
Sodium: 141 mmol/L (ref 135–145)
Sodium: 141 mmol/L (ref 135–145)
Sodium: 141 mmol/L (ref 135–145)
TCO2: 19 mmol/L — ABNORMAL LOW (ref 22–32)
TCO2: 22 mmol/L (ref 22–32)
TCO2: 22 mmol/L (ref 22–32)
TCO2: 31 mmol/L (ref 22–32)
TCO2: 34 mmol/L — ABNORMAL HIGH (ref 22–32)
TCO2: 35 mmol/L — ABNORMAL HIGH (ref 22–32)
TCO2: 33 mmol/L — ABNORMAL HIGH (ref 22–32)
pCO2 arterial: 50 mmHg — ABNORMAL HIGH (ref 32.0–48.0)
pCO2 arterial: 59.9 mmHg — ABNORMAL HIGH (ref 32.0–48.0)
pCO2 arterial: 37.2 mmHg (ref 32.0–48.0)
pCO2 arterial: 36.1 mmHg (ref 32.0–48.0)
pCO2 arterial: 42.6 mmHg (ref 32.0–48.0)
pCO2 arterial: 43.6 mmHg (ref 32.0–48.0)
pCO2 arterial: 40.4 mmHg (ref 32.0–48.0)
pH, Arterial: 7.059 — CL (ref 7.350–7.450)
pH, Arterial: 7.218 — ABNORMAL LOW (ref 7.350–7.450)
pH, Arterial: 7.367 (ref 7.350–7.450)
pH, Arterial: 7.531 — ABNORMAL HIGH (ref 7.350–7.450)
pH, Arterial: 7.494 — ABNORMAL HIGH (ref 7.350–7.450)
pH, Arterial: 7.5 — ABNORMAL HIGH (ref 7.350–7.450)
pH, Arterial: 7.503 — ABNORMAL HIGH (ref 7.350–7.450)
pO2, Arterial: 100 mmHg (ref 83.0–108.0)
pO2, Arterial: 455 mmHg — ABNORMAL HIGH (ref 83.0–108.0)
pO2, Arterial: 249 mmHg — ABNORMAL HIGH (ref 83.0–108.0)
pO2, Arterial: 189 mmHg — ABNORMAL HIGH (ref 83.0–108.0)
pO2, Arterial: 194 mmHg — ABNORMAL HIGH (ref 83.0–108.0)
pO2, Arterial: 199 mmHg — ABNORMAL HIGH (ref 83.0–108.0)
pO2, Arterial: 161 mmHg — ABNORMAL HIGH (ref 83.0–108.0)

## 2019-08-05 LAB — COOXEMETRY PANEL
Carboxyhemoglobin: 1.3 % (ref 0.5–1.5)
Methemoglobin: 1.5 % (ref 0.0–1.5)
O2 Saturation: 89.4 %
Total hemoglobin: 8 g/dL — ABNORMAL LOW (ref 12.0–16.0)

## 2019-08-05 LAB — CBC
HCT: 22.9 % — ABNORMAL LOW (ref 39.0–52.0)
HCT: 29.4 % — ABNORMAL LOW (ref 39.0–52.0)
Hemoglobin: 7.9 g/dL — ABNORMAL LOW (ref 13.0–17.0)
Hemoglobin: 10.1 g/dL — ABNORMAL LOW (ref 13.0–17.0)
MCH: 30.3 pg (ref 26.0–34.0)
MCH: 30.8 pg (ref 26.0–34.0)
MCHC: 34.5 g/dL (ref 30.0–36.0)
MCHC: 34.4 g/dL (ref 30.0–36.0)
MCV: 87.7 fL (ref 80.0–100.0)
MCV: 89.6 fL (ref 80.0–100.0)
Platelets: 64 10*3/uL — ABNORMAL LOW (ref 150–400)
Platelets: 52 10*3/uL — ABNORMAL LOW (ref 150–400)
RBC: 2.61 MIL/uL — ABNORMAL LOW (ref 4.22–5.81)
RBC: 3.28 MIL/uL — ABNORMAL LOW (ref 4.22–5.81)
RDW: 14.6 % (ref 11.5–15.5)
RDW: 14.6 % (ref 11.5–15.5)
WBC: 7.8 10*3/uL (ref 4.0–10.5)
WBC: 6.7 10*3/uL (ref 4.0–10.5)
nRBC: 0 % (ref 0.0–0.2)
nRBC: 0 % (ref 0.0–0.2)

## 2019-08-05 LAB — BASIC METABOLIC PANEL WITH GFR
Anion gap: 8 (ref 5–15)
BUN: 27 mg/dL — ABNORMAL HIGH (ref 8–23)
CO2: 28 mmol/L (ref 22–32)
Calcium: 7.2 mg/dL — ABNORMAL LOW (ref 8.9–10.3)
Chloride: 103 mmol/L (ref 98–111)
Creatinine, Ser: 1.28 mg/dL — ABNORMAL HIGH (ref 0.61–1.24)
GFR calc Af Amer: >60 mL/min
GFR calc non Af Amer: 58 mL/min — ABNORMAL LOW
Glucose, Bld: 162 mg/dL — ABNORMAL HIGH (ref 70–99)
Potassium: 3.6 mmol/L (ref 3.5–5.1)
Sodium: 139 mmol/L (ref 135–145)

## 2019-08-05 LAB — LACTATE DEHYDROGENASE: LDH: 245 U/L — ABNORMAL HIGH (ref 98–192)

## 2019-08-05 LAB — POCT I-STAT, CHEM 8
BUN: 26 mg/dL — ABNORMAL HIGH (ref 8–23)
BUN: 20 mg/dL (ref 8–23)
Calcium, Ion: 1.13 mmol/L — ABNORMAL LOW (ref 1.15–1.40)
Calcium, Ion: 0.96 mmol/L — ABNORMAL LOW (ref 1.15–1.40)
Chloride: 106 mmol/L (ref 98–111)
Chloride: 105 mmol/L (ref 98–111)
Creatinine, Ser: 1.2 mg/dL (ref 0.61–1.24)
Creatinine, Ser: 1 mg/dL (ref 0.61–1.24)
Glucose, Bld: 135 mg/dL — ABNORMAL HIGH (ref 70–99)
Glucose, Bld: 119 mg/dL — ABNORMAL HIGH (ref 70–99)
HCT: 31 % — ABNORMAL LOW (ref 39.0–52.0)
HCT: 24 % — ABNORMAL LOW (ref 39.0–52.0)
Hemoglobin: 10.5 g/dL — ABNORMAL LOW (ref 13.0–17.0)
Hemoglobin: 8.2 g/dL — ABNORMAL LOW (ref 13.0–17.0)
Potassium: 3.9 mmol/L (ref 3.5–5.1)
Potassium: 3.2 mmol/L — ABNORMAL LOW (ref 3.5–5.1)
Sodium: 146 mmol/L — ABNORMAL HIGH (ref 135–145)
Sodium: 148 mmol/L — ABNORMAL HIGH (ref 135–145)
TCO2: 18 mmol/L — ABNORMAL LOW (ref 22–32)
TCO2: 23 mmol/L (ref 22–32)

## 2019-08-05 LAB — HEPATIC FUNCTION PANEL
ALT: 18 U/L (ref 0–44)
AST: 35 U/L (ref 15–41)
Albumin: 2.3 g/dL — ABNORMAL LOW (ref 3.5–5.0)
Alkaline Phosphatase: 44 U/L (ref 38–126)
Bilirubin, Direct: 2.2 mg/dL — ABNORMAL HIGH (ref 0.0–0.2)
Indirect Bilirubin: 1.6 mg/dL — ABNORMAL HIGH (ref 0.3–0.9)
Total Bilirubin: 3.8 mg/dL — ABNORMAL HIGH (ref 0.3–1.2)
Total Protein: 3.7 g/dL — ABNORMAL LOW (ref 6.5–8.1)

## 2019-08-05 LAB — GLUCOSE, CAPILLARY
Glucose-Capillary: 132 mg/dL — ABNORMAL HIGH (ref 70–99)
Glucose-Capillary: 132 mg/dL — ABNORMAL HIGH (ref 70–99)
Glucose-Capillary: 139 mg/dL — ABNORMAL HIGH (ref 70–99)
Glucose-Capillary: 160 mg/dL — ABNORMAL HIGH (ref 70–99)
Glucose-Capillary: 165 mg/dL — ABNORMAL HIGH (ref 70–99)
Glucose-Capillary: 167 mg/dL — ABNORMAL HIGH (ref 70–99)

## 2019-08-05 LAB — APTT
aPTT: 61 s — ABNORMAL HIGH (ref 24–36)
aPTT: 56 seconds — ABNORMAL HIGH (ref 24–36)
aPTT: 67 seconds — ABNORMAL HIGH (ref 24–36)

## 2019-08-05 LAB — BASIC METABOLIC PANEL
Anion gap: 7 (ref 5–15)
BUN: 24 mg/dL — ABNORMAL HIGH (ref 8–23)
CO2: 28 mmol/L (ref 22–32)
Calcium: 7.2 mg/dL — ABNORMAL LOW (ref 8.9–10.3)
Chloride: 103 mmol/L (ref 98–111)
Creatinine, Ser: 1.28 mg/dL — ABNORMAL HIGH (ref 0.61–1.24)
GFR calc Af Amer: >60 mL/min (ref >60)
GFR calc non Af Amer: 58 mL/min — ABNORMAL LOW (ref >60)
Glucose, Bld: 202 mg/dL — ABNORMAL HIGH (ref 70–99)
Potassium: 4.3 mmol/L (ref 3.5–5.1)
Sodium: 138 mmol/L (ref 135–145)

## 2019-08-05 LAB — PREPARE RBC (CROSSMATCH)

## 2019-08-05 LAB — HEPARIN LEVEL (UNFRACTIONATED)
Heparin Unfractionated: <0.1 [IU]/mL — ABNORMAL LOW (ref 0.30–0.70)
Heparin Unfractionated: <0.1 IU/mL — ABNORMAL LOW (ref 0.30–0.70)
Heparin Unfractionated: <0.1 IU/mL — ABNORMAL LOW (ref 0.30–0.70)

## 2019-08-05 LAB — PROTIME-INR
INR: 1.4 — ABNORMAL HIGH (ref 0.8–1.2)
Prothrombin Time: 16.8 seconds — ABNORMAL HIGH (ref 11.4–15.2)

## 2019-08-05 LAB — LACTIC ACID, PLASMA: Lactic Acid, Venous: 1.3 mmol/L (ref 0.5–1.9)

## 2019-08-05 LAB — FIBRINOGEN: Fibrinogen: 402 mg/dL (ref 210–475)

## 2019-08-05 MED ORDER — SODIUM CHLORIDE 0.9% IV SOLUTION: Freq: Once | INTRAVENOUS | Status: AC

## 2019-08-05 MED ORDER — POTASSIUM CHLORIDE 10 MEQ/50ML IV SOLN
10.0000 meq | INTRAVENOUS | Status: AC
  Administered 2019-08-05 (×4): 10 meq via INTRAVENOUS
  Filled 2019-08-05 (×4): qty 50

## 2019-08-05 NOTE — Progress Notes (Signed)
2 Days Post-Op Procedure(s) (LRB): DEBRIDEMENT WOUND (N/A) Subjective: sedated  Objective: Vital signs in last 24 hours: Temp:  [97.7 F (36.5 C)-98.1 F (36.7 C)] 97.7 F (36.5 C) (06/27 0720) Pulse Rate:  [66-95] 88 (06/27 0716) Cardiac Rhythm: A-V Sequential paced (06/26 2130) Resp:  [0-28] 12 (06/27 0720) BP: (76-133)/(54-76) 105/71 (06/27 0700) SpO2:  [99 %-100 %] 100 % (06/27 0720) Arterial Line BP: (72-113)/(51-80) 99/67 (06/27 0720) FiO2 (%):  [40 %] 40 % (06/27 0716) Weight:  [80.4 kg] 80.4 kg (06/27 0600)  Hemodynamic parameters for last 24 hours: PAP: (17-30)/(12-25) 29/17 CVP:  [6 mmHg-52 mmHg] 6 mmHg  Intake/Output from previous day: 06/26 0701 - 06/27 0700 In: 5262.1 [I.V.:2798.7; Blood:630; NG/GT:492.7; IV Piggyback:1340.8] Out: 8055 [Urine:5835; Chest Tube:2220] Intake/Output this shift: No intake/output data recorded.  PE: sedated/intubated/open chest CTA RRR sntnd 2+ edema Lab Results: Recent Labs    08/04/19 1700 08/04/19 1702 08/05/19 0343 08/05/19 0343 08/05/19 0405 08/05/19 0632  WBC 10.4  --  7.8  --   --   --   HGB 9.6*   < > 7.9*   < > 10.9* 7.1*  HCT 27.5*   < > 22.9*   < > 32.0* 21.0*  PLT 70*  --  64*  --   --   --    < > = values in this interval not displayed.   BMET:  Recent Labs    08/04/19 1700 08/04/19 1702 08/05/19 0343 08/05/19 0343 08/05/19 0405 08/05/19 0632  NA 140   < > 138   < > 140 141  K 4.6   < > 4.3   < > 4.0 4.1  CL 105  --  103  --   --   --   CO2 25  --  28  --   --   --   GLUCOSE 152*  --  202*  --   --   --   BUN 21  --  24*  --   --   --   CREATININE 1.27*  --  1.28*  --   --   --   CALCIUM 7.3*  --  7.2*  --   --   --    < > = values in this interval not displayed.    PT/INR:  Recent Labs    08/05/19 0343  LABPROT 16.8*  INR 1.4*   ABG    Component Value Date/Time   PHART 7.494 (H) 08/05/2019 0632   HCO3 32.8 (H) 08/05/2019 0632   TCO2 34 (H) 08/05/2019 0632   ACIDBASEDEF 3.0 (H)  08/03/2019 1515   O2SAT 100.0 08/05/2019 0632   CBG (last 3)  Recent Labs    08/04/19 2351 08/05/19 0402 08/05/19 0742  GLUCAP 202* 167* 132*    Assessment/Plan: S/P Procedure(s) (LRB): DEBRIDEMENT WOUND (N/A) Diuresis decann early part of week   LOS: 10 days    [name] 08/05/2019

## 2019-08-05 NOTE — Progress Notes (Signed)
RT called to redress and clean up aline that appears to have a leak.  RT removed old dressing, cleansed with saline and redressed.  At this time, site looks clean, hub is tight and no leak is noted.  It appears the site is leaking from the puncture area when flushed.  RT will continue to order.

## 2019-08-05 NOTE — CV Procedure (Signed)
ECMO NOTE:  Indication: Post-cardiotomy cardiogenic shock  Initial cannulation date: 08/03/19  ECMO type: VA ECMO  1) Central Ao cannulation 2) RFV venous drainage  ECMO events:  08/02/19 CABG 08/03/19 VT arrest. Chest open at bedside 08/03/19 Back to OR for ECMO cannulation 08/03/19 Washout out for tamponade 08/04/19 Repeat bedside washout for tampoande   Daily data:  Flow 4.37 RPM 3500 dP 21 Pven -65 Sweep 1 SVO289%  Labs:  7.49/42/194/100% Hgb7.1 LDH 245 Heparin level: undetectable Lactic acid 1.3  Plan:  ECMO flows look good.  Sweep at 1 Hemodynamics improved with VA ecmo. Remains on NE 10. Hgb still drifting down. Got 2u RBCs this am CTs draining about 60/hr  Possible decannulation and chest closure in next 24-48 hours.   Arvilla Meres, MD  1:45 PM

## 2019-08-05 NOTE — Progress Notes (Signed)
ANTICOAGULATION CONSULT NOTE - Follow-Up Consult  Pharmacy Consult for heparin > resume 6/26 per Dr. Orvan Seen Indication: VA ECMO  Allergies  Allergen Reactions  . Empagliflozin     Other reaction(s): diarrhea  . Other     Other reaction(s): Unknown  . Simvastatin     Other reaction(s): diarrhea  . Sitagliptin     Other reaction(s): diarrhea/abd pain    Patient Measurements: Height: 5\' 9"  (175.3 cm) (per history) Weight: 80.4 kg (177 lb 4 oz) IBW/kg (Calculated) : 70.7 Heparin Dosing Weight: 73kg  Vital Signs: Temp: 98.1 F (36.7 C) (06/27 2200) Temp Source: Core (06/27 2000) BP: 108/75 (06/27 2200) Pulse Rate: 84 (06/27 1923)  Labs: Recent Labs    08/03/19 1309 08/03/19 1515 08/04/19 0513 08/04/19 0527 08/04/19 1700 08/04/19 1702 08/05/19 0343 08/05/19 0405 08/05/19 0632 08/05/19 1414 08/05/19 1604 08/05/19 1604 08/05/19 1611 08/05/19 2147 08/05/19 2155  HGB  --    < > 8.1*   < > 9.6*   < > 7.9*   < >   < >  --  10.1*   < > 9.5*  --  9.2*  HCT  --    < > 23.2*   < > 27.5*   < > 22.9*   < >   < >  --  29.4*  --  28.0*  --  27.0*  PLT 44*   < > 93*   < > 70*  --  64*  --   --   --  52*  --   --   --   --   APTT 44*   < > 43*   < > 57*   < > 61*  --   --  56*  --   --   --  67*  --   LABPROT 18.7*  --  17.9*  --   --   --  16.8*  --   --   --   --   --   --   --   --   INR 1.6*  --  1.5*  --   --   --  1.4*  --   --   --   --   --   --   --   --   HEPARINUNFRC <0.10*  --   --   --   --    < > <0.10*  --   --  <0.10*  --   --   --  <0.10*  --   CREATININE  --    < > 1.16   < > 1.27*  --  1.28*  --   --   --  1.28*  --   --   --   --    < > = values in this interval not displayed.    Estimated Creatinine Clearance: 57.5 mL/min (A) (by C-G formula based on SCr of 1.28 mg/dL (H)).  Assessment: 64 year old male s/p CABG on 6/24, subsequently developed VT arrest this am underwent emergency bedside median sternotomy then taken to OR for VA ECMO cannulation. Patient  then decompensated again in ICU, concern for ongoing bleeding and patient again underwent bedside sternotomy. Patient currently stable post multiple transfusions.   Heparin drip running at 750 units/hr.  Chest tube output appears to be stable ~ 50-60 ml/hr, Hgb stable pltc low stable.  Heparin level remains undetectable, slight bump aPTT 67sec ECMO flow decreased to see how tolerates in prep for  possible decanulation - will increase heparin drip    Discussed with Drs. Atkins and Bensimhon, okay to increase heparin.  May decannulate tomorrow.  Goal of Therapy:  Heparin level 0.3-0.5 units/ml Monitor platelets by anticoagulation protocol: Yes   Plan:  Increase heparin to 900 units/hr. Aptt and heparin level in 6 hrs. Will need to watch bleeding/oozing carefully.  Leota Sauers Pharm.D. CPP, BCPS Clinical Pharmacist [phone] 08/05/2019 10:39 PM   [facility] pharmacy phone numbers are listed on [email or web address]

## 2019-08-05 NOTE — Progress Notes (Signed)
ANTICOAGULATION CONSULT NOTE - Follow-Up Consult  Pharmacy Consult for heparin > resume 6/26 per Dr. Orvan Seen Indication: VA ECMO  Allergies  Allergen Reactions  . Empagliflozin     Other reaction(s): diarrhea  . Other     Other reaction(s): Unknown  . Simvastatin     Other reaction(s): diarrhea  . Sitagliptin     Other reaction(s): diarrhea/abd pain    Patient Measurements: Height: 5\' 9"  (175.3 cm) (per history) Weight: 80.4 kg (177 lb 4 oz) IBW/kg (Calculated) : 70.7 Heparin Dosing Weight: 73kg  Vital Signs: Temp: 97.9 F (36.6 C) (06/27 1530) Temp Source: Core (Comment) (06/27 1200) BP: 92/80 (06/27 1530) Pulse Rate: 81 (06/27 1103)  Labs: Recent Labs    08/03/19 1309 08/03/19 1515 08/04/19 0513 08/04/19 0527 08/04/19 1700 08/04/19 1702 08/04/19 2100 08/04/19 2115 08/05/19 0343 08/05/19 0343 08/05/19 0405 08/05/19 0632 08/05/19 1414  HGB  --    < > 8.1*   < > 9.6*   < >  --    < > 7.9*   < > 10.9* 7.1*  --   HCT  --    < > 23.2*   < > 27.5*   < >  --    < > 22.9*  --  32.0* 21.0*  --   PLT 44*   < > 93*  --  70*  --   --   --  64*  --   --   --   --   APTT 44*   < > 43*   < > 57*   < > 57*  --  61*  --   --   --  56*  LABPROT 18.7*  --  17.9*  --   --   --   --   --  16.8*  --   --   --   --   INR 1.6*  --  1.5*  --   --   --   --   --  1.4*  --   --   --   --   HEPARINUNFRC <0.10*   < >  --   --   --   --  <0.10*  --  <0.10*  --   --   --  <0.10*  CREATININE  --    < > 1.16  --  1.27*  --   --   --  1.28*  --   --   --   --    < > = values in this interval not displayed.    Estimated Creatinine Clearance: 57.5 mL/min (A) (by C-G formula based on SCr of 1.28 mg/dL (H)).  Assessment: 64 year old male s/p CABG on 6/24, subsequently developed VT arrest this am underwent emergency bedside median sternotomy then taken to OR for VA ECMO cannulation. Patient then decompensated again in ICU, concern for ongoing bleeding and patient again underwent bedside  sternotomy. Patient currently stable post multiple transfusions.   Heparin drip running at 600 units/hr.  Chest tube output appears to be stable ~ 50-60 ml/hr, Hgb 7.1.  Heparin level remains undetectable, aPTT 56   Discussed with Drs. Atkins and Bensimhon, okay to increase heparin.  May decannulate tomorrow.  Goal of Therapy:  Heparin level 0.3-0.5 units/ml Monitor platelets by anticoagulation protocol: Yes   Plan:  Increase heparin to 750 units/hr. Aptt and heparin level in 6 hrs. Will need to watch bleeding/oozing carefully.  Marguerite Olea, [facility] Clinical Pharmacist  08/05/2019 3:41 PM   [facility] pharmacy phone numbers are listed on [email or web address]

## 2019-08-05 NOTE — Progress Notes (Addendum)
ANTICOAGULATION CONSULT NOTE - Follow-Up Consult  Pharmacy Consult for heparin > resume 6/26 per Dr. Vickey Sages Indication: VA ECMO  Allergies  Allergen Reactions  . Empagliflozin     Other reaction(s): diarrhea  . Other     Other reaction(s): Unknown  . Simvastatin     Other reaction(s): diarrhea  . Sitagliptin     Other reaction(s): diarrhea/abd pain    Patient Measurements: Height: 5\' 9"  (175.3 cm) (per history) Weight: 80.4 kg (177 lb 4 oz) IBW/kg (Calculated) : 70.7 Heparin Dosing Weight: 73kg  Vital Signs: Temp: 97.9 F (36.6 C) (06/27 0831) Temp Source: Core (Comment) (06/27 0800) BP: 92/67 (06/27 0831) Pulse Rate: 88 (06/27 0716)  Labs: Recent Labs    08/03/19 1309 08/03/19 1515 08/04/19 0513 08/04/19 0527 08/04/19 1700 08/04/19 1702 08/04/19 2100 08/04/19 2115 08/05/19 0343 08/05/19 0343 08/05/19 0405 08/05/19 0632  HGB  --    < > 8.1*   < > 9.6*   < >  --    < > 7.9*   < > 10.9* 7.1*  HCT  --    < > 23.2*   < > 27.5*   < >  --    < > 22.9*  --  32.0* 21.0*  PLT 44*   < > 93*  --  70*  --   --   --  64*  --   --   --   APTT 44*   < > 43*   < > 57*  --  57*  --  61*  --   --   --   LABPROT 18.7*  --  17.9*  --   --   --   --   --  16.8*  --   --   --   INR 1.6*  --  1.5*  --   --   --   --   --  1.4*  --   --   --   HEPARINUNFRC <0.10*  --   --   --   --   --  <0.10*  --  <0.10*  --   --   --   CREATININE  --    < > 1.16  --  1.27*  --   --   --  1.28*  --   --   --    < > = values in this interval not displayed.    Estimated Creatinine Clearance: 57.5 mL/min (A) (by C-G formula based on SCr of 1.28 mg/dL (H)).  Assessment: 64 year old male s/p CABG on 6/24, subsequently developed VT arrest this am underwent emergency bedside median sternotomy then taken to OR for VA ECMO cannulation. Patient then decompensated again in ICU, concern for ongoing bleeding and patient again underwent bedside sternotomy. Patient currently stable post multiple transfusions.    Heparin drip running at 500 units/hr.  Chest tube output appears to be decreasing, Hgb 7.1.  Heparin level remains undetectable, aPTT 61.  Discussed with Dr. 7/24, okay to increase heparin a little.  Goal of Therapy:  Heparin level 0.3-0.5 units/ml Monitor platelets by anticoagulation protocol: Yes   Plan:  Increase heparin to 600 units/hr. Aptt and heparin level in 6 hrs. Will need to watch bleeding/oozing carefully.  Vickey Sages, Reece Leader, BCCP Clinical Pharmacist  08/05/2019 8:37 AM   [facility] pharmacy phone numbers are listed on [email or web address]

## 2019-08-05 NOTE — Progress Notes (Signed)
Patient ID: Todd [name], male   DOB: 06/04/1955, 64 y.o.   MRN: [id number]     Advanced Heart Failure Rounding Note  PCP-Cardiologist: No primary care provider on file.   Subjective:    08/02/19 CABG 08/03/19 VF arrest. Chest open at bedside 08/03/19 Back to OR for ECMO cannulation 08/03/19 Washout out for tamponade 08/04/19 Repeat bedside washout for tampoande  Remains intubated/sedated. On NE 10 and milrinone 0.25. Good urine output. CTs draining about 60/hr of serosanguinous fluid. Weight down 15 pounds overnight. Still up 15.   On low-dose heparin hgb 7.1 and got 2u RBCs  Swan# CVP 5 PA 30/30  Co-ox 89% (?)  ECMO:  Flow 4.37 RPM 3500 dP 21 Pven -65 Sweep 1 SVO289%  Labs:  7.49/42/194/100% Hgb7.1 LDH 245 Heparin level: undetectable Lactic acid 1.3    Objective:   Weight Range: 80.4 kg Body mass index is 26.18 kg/m.   Vital Signs:   Temp:  [97.7 F (36.5 C)-98.1 F (36.7 C)] 97.9 F (36.6 C) (06/27 1330) Pulse Rate:  [66-95] 81 (06/27 1103) Resp:  [0-24] 11 (06/27 1330) BP: (76-133)/(54-79) 89/66 (06/27 1330) SpO2:  [99 %-100 %] 99 % (06/27 1330) Arterial Line BP: (72-113)/(51-73) 89/65 (06/27 1330) FiO2 (%):  [40 %] 40 % (06/27 1200) Weight:  [80.4 kg] 80.4 kg (06/27 0600) Last BM Date: 07/29/19  Weight change: Filed Weights   08/02/19 0419 08/04/19 0600 08/05/19 0600  Weight: 73.3 kg 87 kg 80.4 kg    Intake/Output:   Intake/Output Summary (Last 24 hours) at 08/05/2019 1346 Last data filed at 08/05/2019 1300 Gross per 24 hour  Intake 4502.55 ml  Output 4750 ml  Net -247.45 ml      Physical Exam    General:  On vent sedated HEENT: normal + ETT Neck: supple. RIJ swan Carotids 2+ bilat; no bruits. No lymphadenopathy or thryomegaly appreciated. Cor: Chest open with dressing in place. Heart down in chest. Spacer in place.  Ao cannula site ok. Regular.  CTs in place Lungs: clear Abdomen: soft, nontender, nondistended. No  hepatosplenomegaly. No bruits or masses. Quiet  Extremities: no cyanosis, clubbing, rash, 1+ edema RFV cannula. +SCDs Neuro: sedated   Telemetry   A paced 80s  Personally reviewed  Labs    CBC Recent Labs    08/04/19 1700 08/04/19 1702 08/05/19 0343 08/05/19 0343 08/05/19 0405 08/05/19 0632  WBC 10.4  --  7.8  --   --   --   HGB 9.6*   < > 7.9*   < > 10.9* 7.1*  HCT 27.5*   < > 22.9*   < > 32.0* 21.0*  MCV 85.9  --  87.7  --   --   --   PLT 70*  --  64*  --   --   --    < > = values in this interval not displayed.   Basic Metabolic Panel Recent Labs    08/03/19 1616 08/03/19 1829 08/04/19 0513 08/04/19 0527 08/04/19 1700 08/04/19 1702 08/05/19 0343 08/05/19 0343 08/05/19 0405 08/05/19 0632  NA 141   < > 143   < > 140   < > 138   < > 140 141  K 3.3*   < > 3.8   < > 4.6   < > 4.3   < > 4.0 4.1  CL 105   < > 107   < > 105  --  103  --   --   --   CO2  20*   < > 26   < > 25  --  28  --   --   --   GLUCOSE 261*   < > 129*   < > 152*  --  202*  --   --   --   BUN 22   < > 23   < > 21  --  24*  --   --   --   CREATININE 1.38*   < > 1.16   < > 1.27*  --  1.28*  --   --   --   CALCIUM 8.0*   < > 7.2*   < > 7.3*  --  7.2*  --   --   --   MG 2.1  --  1.7  --   --   --   --   --   --   --    < > = values in this interval not displayed.   Liver Function Tests Recent Labs    08/04/19 0513 08/05/19 0343  AST 42* 35  ALT 18 18  ALKPHOS 30* 44  BILITOT 2.7* 3.8*  PROT 3.6* 3.7*  ALBUMIN 2.2* 2.3*   No results for input(s): LIPASE, AMYLASE in the last 72 hours. Cardiac Enzymes No results for input(s): CKTOTAL, CKMB, CKMBINDEX, TROPONINI in the last 72 hours.  BNP: BNP (last 3 results) Recent Labs    07/26/19 1236  BNP 2,568.2*    ProBNP (last 3 results) No results for input(s): PROBNP in the last 8760 hours.   D-Dimer Recent Labs    08/03/19 0950 08/03/19 1309  DDIMER 3.48* 2.06*   Hemoglobin A1C No results for input(s): HGBA1C in the last 72  hours. Fasting Lipid Panel Recent Labs    08/03/19 1309  TRIG 56   Thyroid Function Tests No results for input(s): TSH, T4TOTAL, T3FREE, THYROIDAB in the last 72 hours.  Invalid input(s): FREET3  Other results:   Imaging    DG Chest Port 1 View  Result Date: 08/05/2019 CLINICAL DATA:  Follow-up for ARDS. EXAM: PORTABLE CHEST 1 VIEW COMPARISON:  08/04/2019 FINDINGS: Support apparatus including endotracheal tube, nasogastric tube, mediastinal tube, right internal jugular Swan-Ganz catheter, left chest tube and ECMO device are all stable. Lungs demonstrate bilateral vascular congestion and interstitial prominence, as well as opacity at the left lung base, the latter finding likely atelectasis, stable from the previous day's exam. Small left and probable small right pleural effusions. No pneumothorax. No mediastinal widening. Cardiac silhouette is normal in size. IMPRESSION: 1. Stable appearance of the lungs from the previous day's exam with vascular congestion and interstitial thickening suggesting a component of interstitial edema. More confluent opacity at the left lung base is most likely atelectasis. Small left and suspected small right pleural effusions. No new lung abnormalities. 2. No pneumothorax. 3. Stable support apparatus. Electronically Signed   By: Lajean Manes M.D.   On: 08/05/2019 08:32     Medications:     Scheduled Medications: . sodium chloride   Intravenous Once  . sodium chloride   Intravenous Once  . sodium chloride   Intravenous Once  . sodium chloride   Intravenous Once  . acetaminophen  1,000 mg Oral Q6H   Or  . acetaminophen (TYLENOL) oral liquid 160 mg/5 mL  1,000 mg Per Tube Q6H  . artificial tears   Both Eyes Q8H  . aspirin EC  325 mg Oral Daily   Or  . aspirin  324 mg Per  Tube Daily  . bisacodyl  10 mg Oral Daily   Or  . bisacodyl  10 mg Rectal Daily  . chlorhexidine gluconate (MEDLINE KIT)  15 mL Mouth Rinse BID  . Chlorhexidine Gluconate Cloth   6 each Topical Daily  . citalopram  20 mg Per Tube Daily  . docusate  200 mg Per Tube Daily  . feeding supplement (PIVOT 1.5 CAL)  1,000 mL Per Tube Q24H  . feeding supplement (PRO-STAT SUGAR FREE 64)  30 mL Per Tube BID  . fentaNYL (SUBLIMAZE) injection  50 mcg Intravenous Once  . furosemide  40 mg Intravenous BID  . insulin aspart  0-24 Units Subcutaneous Q4H  . mouth rinse  15 mL Mouth Rinse 10 times per day  . metoCLOPramide (REGLAN) injection  10 mg Intravenous Q6H  . metoprolol tartrate  12.5 mg Oral BID   Or  . metoprolol tartrate  12.5 mg Per Tube BID  . nicotine  21 mg Transdermal Daily  . pantoprazole (PROTONIX) IV  40 mg Intravenous QHS  . rosuvastatin  20 mg Per Tube Daily  . sodium chloride flush  10-40 mL Intracatheter Q12H  . sodium chloride flush  3 mL Intravenous Q12H  . umeclidinium bromide  1 puff Inhalation Daily    Infusions: . sodium chloride 10 mL/hr at 08/03/19 1424  . sodium chloride 250 mL (08/03/19 2118)  . sodium chloride Stopped (08/04/19 1031)  . albumin human 12.5 g (08/04/19 1820)  . amiodarone 30 mg/hr (08/05/19 1300)  . dexmedetomidine (PRECEDEX) IV infusion 0.7 mcg/kg/hr (08/05/19 1300)  . dextrose 5 % and 0.45% NaCl    . epinephrine Stopped (08/04/19 2331)  . fentaNYL infusion INTRAVENOUS 150 mcg/hr (08/05/19 1300)  . heparin 600 Units/hr (08/05/19 1300)  . lactated ringers 500 mL/hr at 08/02/19 1720  . lactated ringers 20 mL/hr at 08/02/19 1637  . lactated ringers 20 mL/hr at 08/05/19 1300  . meropenem (MERREM) IV 1 g (08/05/19 1302)  . milrinone 0.25 mcg/kg/min (08/05/19 1300)  . nitroGLYCERIN Stopped (08/03/19 7169)  . norepinephrine (LEVOPHED) Adult infusion 10 mcg/min (08/05/19 1300)  . phenylephrine (NEO-SYNEPHRINE) Adult infusion 0 mcg/min (08/03/19 0900)  . propofol (DIPRIVAN) infusion 30 mcg/kg/min (08/05/19 1300)  . vancomycin Stopped (08/05/19 0927)  . vasopressin (PITRESSIN) infusion - *FOR SHOCK*      PRN  Medications: sodium chloride, albumin human, dextrose, fentaNYL, lactated ringers, metoprolol tartrate, midazolam, morphine injection, ondansetron (ZOFRAN) IV, sodium chloride flush, sodium chloride flush   Assessment/Plan   1. CAD: 3VD - s/p CABG x 4 with LIMA-LAD, SVG-D1, SVG-PDA, and radial-OM1 on 6/24.   - Continue ASA and Crestor per tube.  2. Cardiac arrest: VF arrest with prolonged code, VA ECMO begun.  - Rhythm now stable. Continue IV amiodarone at 30 mg/hr.   - Eventual evaluation for ICD.  - Would consider eventual cardiac cath to assess patency of grafts.  3. Cardiogenic shock: Echo with EF 20-25%, moderate RV dysfunction.  Now on [place] ECMO post-VF arrest, cannulated 6/25.  Stable s/p return to OR for mediastinal re-exploration due to bleeding on 6/25.   - Remains on VA ECMO. Parameters improved - Continue NE 10 and milrinone 0.25  - Co-ox ok - Volume status improving but still 15 pounds up. Continue IV lasix 4. Acute hypoxemic respiratory failure: In setting of cardiac arrest.  H/o COPD.  Pulmonary edema on CXR.  Stable with sweep 1.5. CCM managing.   - Diuresis as above.  5. Anemia: Now s/p mediastinal re-exploration x  3 - Bleeding appears to be slowing - Hgb 7.1 - Transfuse hgb < 8.  6. Thrombocytopenia:  - PLTs down to 63K this am - Follow   7. Rhythm:  - a paced this am  8. ID: Chest open, empirically covering with meropenem and vancomycin.  - Afebrile - Discussed dosing with PharmD personally.  Plan:  ECMO flows look good.  Sweep at 1 Hemodynamics improved with VA ecmo. Remains on NE 10. Hgb still drifting down. Got 2u RBCs this am CTs draining about 60/hr  Possible decannulation and chest closure in next 24-48 hours.   CRITICAL CARE Performed by: Glori Bickers  Total critical care time: 40 minutes  Critical care time was exclusive of separately billable procedures and treating other patients.  Critical care was necessary to treat or prevent  imminent or life-threatening deterioration.  Critical care was time spent personally by me on the following activities: development of treatment plan with patient and/or surrogate as well as nursing, discussions with consultants, evaluation of patient's response to treatment, examination of patient, obtaining history from patient or surrogate, ordering and performing treatments and interventions, ordering and review of laboratory studies, ordering and review of radiographic studies, pulse oximetry and re-evaluation of patient's condition.   Length of Stay: [place], MD  08/05/2019, 1:46 PM  Advanced Heart Failure Team Pager [phone] (M-F; [place])  Please contact [facility] for night-coverage after hours (4p -7a ) and weekends on [email or web address]

## 2019-08-05 NOTE — Progress Notes (Addendum)
NAME:  Todd [name], MRN:  [id number], DOB:  1955-10-29, LOS: 12 ADMISSION DATE:  07/26/2019, CONSULTATION DATE:  08/03/19 REFERRING MD:  Roxan Hockey, CHIEF COMPLAINT:  ECMO   Brief History   VA ECMO for post CABG Vtach arrest  History of present illness   Presented with worsening dyspnea 6/17 c/w CHF exacerbation Cath with severe triple vessel disease Underwent CABG 6/24 Early this AM Vtach arrest Chest opened bedside and cardiac massage initiated as well as multiple cardioversions, amiodarone, bicarb etc Brought to OR and cannulated for VA ECMO PCCM consulted to assist with management Comorbidities include DM, heavy smoking, COPD  Past Medical History  Depression Ischemic cardiomyopathy HTN [facility] Events   6/24 CABG 6/25 VA cannulation  Consults:  CHF, PCCM, TCTS  Procedures:  See below  Significant Diagnostic Tests:  6/22 spirometry with restrictive physiology, preserved FEV1/FVC CXR today with bilateral airspace disease, cannulas in good position, deep left sulcus that was present yesterday, no clear PTX  Micro Data:  COVID and HIV Neg  Antimicrobials:  Vanc>> Cefuroxime>>   Interim history/subjective:  Doing great this am. Off pressors. Heparin restarted. Flows stable.  Objective   Blood pressure 105/71, pulse 95, temperature 97.7 F (36.5 C), resp. rate 12, height 5\' 9"  (1.753 m), weight 80.4 kg, SpO2 100 %. PAP: (17-30)/(12-25) 30/18 CVP:  [2 mmHg-52 mmHg] 15 mmHg  Vent Mode: PRVC FiO2 (%):  [40 %] 40 % Set Rate:  [12 bmp] 12 bmp Vt Set:  [560 mL] 560 mL PEEP:  [5 cmH20] 5 cmH20 Plateau Pressure:  [12 cmH20-23 cmH20] 22 cmH20   Intake/Output Summary (Last 24 hours) at 08/05/2019 8341 Last data filed at 08/05/2019 0700 Gross per 24 hour  Intake 5262.14 ml  Output 7945 ml  Net -2682.86 ml   Filed Weights   08/02/19 0419 08/04/19 0600 08/05/19 0600  Weight: 73.3 kg 87 kg 80.4 kg    Examination: General: ill appearing man  on vent HENT: ett in place, minimal secretions Lungs: rhonci bilaterally Cardiovascular: chest open, dressings clear, clearing serosanguinous output from mediastinal drains Abdomen: Soft, hypoactive bs Extremities: mild diffuse edema Neuro: heavily sedated but triggering vent GU: foley in place with dark urine Skin: no rashes  Centrally cannulated arterially, R femoral venous drainage catheter Flows 4LPM, circuit pressures okay Sweep 2, vent waveforms benign Foley, mediastinal drains, OGT in place RIJ MML   ABG looks good Plts 70>>64 Hgb 9.6>>7.9 Cr 1.3>>1.3 LDH stable low 200s Firbrinogen 402 PTT 61 CXR improved appearance of upper mediastinal widening, mild interstitial edema persists  Resolved Hospital Problem list   N/A  Assessment & Plan:  S/P VA ECMO cannulation after Vtach arrest in setting of recent CABG. - Heparin slowly being uptitrated - Transfusion goals per TCTS - ?if we could consider decannulation today or tomorrow - Trend usual indices: lactate, Uop: okay for now - Amiodarone per CHF - Would continue broad spectrum abx for now given number of times has needed bedside exploration (3x to date)  Hemorrhagic shock- resolved - Management per TCTS, transfusions as needed, avoid hypothermia, acidemia, coagulopathy as able  On mechanical ventilation for above - Lung protective tidal volumes - Adjust sweep for pH ~7.35-7.7.45: on minimal sweep - Heavy sedation with open chest for now  Ischemic cardiomyopathy - Management per CHF team  DM with hyperglycemia - Insulin endotool  Best practice:  Diet: hold TF in case of decannulation Pain/Anxiety/Delirium protocol (if indicated): fentanyl, propofol, precedex VAP protocol (if indicated): in place DVT  prophylaxis:heparin gtt GI prophylaxis: PPI Glucose control: see above Mobility: BR Code Status: Full Family Communication: per primary Disposition:  ICU  The patient is critically ill with multiple organ  systems failure and requires high complexity decision making for assessment and support, frequent evaluation and titration of therapies, application of advanced monitoring technologies and extensive interpretation of multiple databases. Critical Care Time devoted to patient care services described in this note independent of APP/resident time (if applicable)  is 32 minutes.   Myrla Halsted MD [facility] 08/05/2019 7:09 AM Personal pager: [phone] If unanswered, please page CCM On-call: #[phone]

## 2019-08-05 NOTE — Progress Notes (Signed)
PT Cancellation Note  Patient Details Name: Todd [name] MRN: [id number] DOB: March 16, 1955   Cancelled Treatment:    Reason Eval/Treat Not Completed: Patient not medically ready (pt remains on ECMO and not currently appropriate for therapy. Will sign off and await new order, discussed with RN)   Enedina Finner Azlin Zilberman 08/05/2019, 8:09 AM  Merryl Hacker, PT Acute Rehabilitation Services Pager: [phone] Office: [phone]

## 2019-08-06 ENCOUNTER — Inpatient Hospital Stay (HOSPITAL_COMMUNITY): Payer: Medicare Other

## 2019-08-06 ENCOUNTER — Inpatient Hospital Stay (HOSPITAL_COMMUNITY): Payer: Medicare Other | Admitting: Certified Registered Nurse Anesthetist

## 2019-08-06 ENCOUNTER — Encounter (HOSPITAL_COMMUNITY)
Admission: RE | Disposition: A | Discharge status: Nursing Home (Includes ICF) | Payer: Self-pay | Source: Home / Self Care | Attending: Thoracic Surgery (Cardiothoracic Vascular Surgery)

## 2019-08-06 ENCOUNTER — Encounter (HOSPITAL_COMMUNITY): Payer: Self-pay | Admitting: Thoracic Surgery (Cardiothoracic Vascular Surgery)

## 2019-08-06 DIAGNOSIS — I469 Cardiac arrest, cause unspecified: Secondary | ICD-10-CM

## 2019-08-06 DIAGNOSIS — J80 Acute respiratory distress syndrome: Secondary | ICD-10-CM

## 2019-08-06 HISTORY — PX: CANNULATION FOR ECMO (EXTRACORPOREAL MEMBRANE OXYGENATION): SHX6796

## 2019-08-06 HISTORY — PX: TEE WITHOUT CARDIOVERSION: SHX5443

## 2019-08-06 HISTORY — PX: PLACEMENT OF IMPELLA LEFT VENTRICULAR ASSIST DEVICE: SHX6519

## 2019-08-06 LAB — POCT I-STAT 7, (LYTES, BLD GAS, ICA,H+H)
Acid-Base Excess: 8 mmol/L — ABNORMAL HIGH (ref 0.0–2.0)
Acid-Base Excess: 7 mmol/L — ABNORMAL HIGH (ref 0.0–2.0)
Acid-Base Excess: 6 mmol/L — ABNORMAL HIGH (ref 0.0–2.0)
Acid-Base Excess: 9 mmol/L — ABNORMAL HIGH (ref 0.0–2.0)
Acid-Base Excess: 5 mmol/L — ABNORMAL HIGH (ref 0.0–2.0)
Acid-Base Excess: 4 mmol/L — ABNORMAL HIGH (ref 0.0–2.0)
Acid-Base Excess: 5 mmol/L — ABNORMAL HIGH (ref 0.0–2.0)
Acid-Base Excess: 2 mmol/L (ref 0.0–2.0)
Bicarbonate: 32.5 mmol/L — ABNORMAL HIGH (ref 20.0–28.0)
Bicarbonate: 31.5 mmol/L — ABNORMAL HIGH (ref 20.0–28.0)
Bicarbonate: 32 mmol/L — ABNORMAL HIGH (ref 20.0–28.0)
Bicarbonate: 36.2 mmol/L — ABNORMAL HIGH (ref 20.0–28.0)
Bicarbonate: 29.2 mmol/L — ABNORMAL HIGH (ref 20.0–28.0)
Bicarbonate: 28.6 mmol/L — ABNORMAL HIGH (ref 20.0–28.0)
Bicarbonate: 29.7 mmol/L — ABNORMAL HIGH (ref 20.0–28.0)
Bicarbonate: 29 mmol/L — ABNORMAL HIGH (ref 20.0–28.0)
Calcium, Ion: 1.06 mmol/L — ABNORMAL LOW (ref 1.15–1.40)
Calcium, Ion: 1.06 mmol/L — ABNORMAL LOW (ref 1.15–1.40)
Calcium, Ion: 1.1 mmol/L — ABNORMAL LOW (ref 1.15–1.40)
Calcium, Ion: 1.12 mmol/L — ABNORMAL LOW (ref 1.15–1.40)
Calcium, Ion: 1.07 mmol/L — ABNORMAL LOW (ref 1.15–1.40)
Calcium, Ion: 1.03 mmol/L — ABNORMAL LOW (ref 1.15–1.40)
Calcium, Ion: 0.85 mmol/L — CL (ref 1.15–1.40)
Calcium, Ion: 1.16 mmol/L (ref 1.15–1.40)
HCT: 41 % (ref 39.0–52.0)
HCT: 26 % — ABNORMAL LOW (ref 39.0–52.0)
HCT: 28 % — ABNORMAL LOW (ref 39.0–52.0)
HCT: 29 % — ABNORMAL LOW (ref 39.0–52.0)
HCT: 29 % — ABNORMAL LOW (ref 39.0–52.0)
HCT: 31 % — ABNORMAL LOW (ref 39.0–52.0)
HCT: 30 % — ABNORMAL LOW (ref 39.0–52.0)
HCT: 23 % — ABNORMAL LOW (ref 39.0–52.0)
Hemoglobin: 13.9 g/dL (ref 13.0–17.0)
Hemoglobin: 8.8 g/dL — ABNORMAL LOW (ref 13.0–17.0)
Hemoglobin: 9.5 g/dL — ABNORMAL LOW (ref 13.0–17.0)
Hemoglobin: 9.9 g/dL — ABNORMAL LOW (ref 13.0–17.0)
Hemoglobin: 9.9 g/dL — ABNORMAL LOW (ref 13.0–17.0)
Hemoglobin: 10.5 g/dL — ABNORMAL LOW (ref 13.0–17.0)
Hemoglobin: 10.2 g/dL — ABNORMAL LOW (ref 13.0–17.0)
Hemoglobin: 7.8 g/dL — ABNORMAL LOW (ref 13.0–17.0)
O2 Saturation: 100 %
O2 Saturation: 99 %
O2 Saturation: 100 %
O2 Saturation: 98 %
O2 Saturation: 98 %
O2 Saturation: 98 %
O2 Saturation: 99 %
O2 Saturation: 99 %
Patient temperature: 36.6
Patient temperature: 36.5
Patient temperature: 36.6
Patient temperature: 36.6
Patient temperature: 36.6
Potassium: 3.5 mmol/L (ref 3.5–5.1)
Potassium: 3.5 mmol/L (ref 3.5–5.1)
Potassium: 3.6 mmol/L (ref 3.5–5.1)
Potassium: 3.6 mmol/L (ref 3.5–5.1)
Potassium: 4 mmol/L (ref 3.5–5.1)
Potassium: 3.6 mmol/L (ref 3.5–5.1)
Potassium: 3.7 mmol/L (ref 3.5–5.1)
Potassium: 3.7 mmol/L (ref 3.5–5.1)
Sodium: 141 mmol/L (ref 135–145)
Sodium: 141 mmol/L (ref 135–145)
Sodium: 137 mmol/L (ref 135–145)
Sodium: 138 mmol/L (ref 135–145)
Sodium: 139 mmol/L (ref 135–145)
Sodium: 140 mmol/L (ref 135–145)
Sodium: 139 mmol/L (ref 135–145)
Sodium: 139 mmol/L (ref 135–145)
TCO2: 34 mmol/L — ABNORMAL HIGH (ref 22–32)
TCO2: 33 mmol/L — ABNORMAL HIGH (ref 22–32)
TCO2: 34 mmol/L — ABNORMAL HIGH (ref 22–32)
TCO2: 38 mmol/L — ABNORMAL HIGH (ref 22–32)
TCO2: 31 mmol/L (ref 22–32)
TCO2: 30 mmol/L (ref 22–32)
TCO2: 31 mmol/L (ref 22–32)
TCO2: 31 mmol/L (ref 22–32)
pCO2 arterial: 44.4 mmHg (ref 32.0–48.0)
pCO2 arterial: 43.2 mmHg (ref 32.0–48.0)
pCO2 arterial: 50.6 mmHg — ABNORMAL HIGH (ref 32.0–48.0)
pCO2 arterial: 63.9 mmHg — ABNORMAL HIGH (ref 32.0–48.0)
pCO2 arterial: 43.2 mmHg (ref 32.0–48.0)
pCO2 arterial: 43.6 mmHg (ref 32.0–48.0)
pCO2 arterial: 43.8 mmHg (ref 32.0–48.0)
pCO2 arterial: 62 mmHg — ABNORMAL HIGH (ref 32.0–48.0)
pH, Arterial: 7.472 — ABNORMAL HIGH (ref 7.350–7.450)
pH, Arterial: 7.469 — ABNORMAL HIGH (ref 7.350–7.450)
pH, Arterial: 7.36 (ref 7.350–7.450)
pH, Arterial: 7.408 (ref 7.350–7.450)
pH, Arterial: 7.438 (ref 7.350–7.450)
pH, Arterial: 7.426 (ref 7.350–7.450)
pH, Arterial: 7.44 (ref 7.350–7.450)
pH, Arterial: 7.276 — ABNORMAL LOW (ref 7.350–7.450)
pO2, Arterial: 169 mmHg — ABNORMAL HIGH (ref 83.0–108.0)
pO2, Arterial: 120 mmHg — ABNORMAL HIGH (ref 83.0–108.0)
pO2, Arterial: 114 mmHg — ABNORMAL HIGH (ref 83.0–108.0)
pO2, Arterial: 500 mmHg — ABNORMAL HIGH (ref 83.0–108.0)
pO2, Arterial: 111 mmHg — ABNORMAL HIGH (ref 83.0–108.0)
pO2, Arterial: 96 mmHg (ref 83.0–108.0)
pO2, Arterial: 151 mmHg — ABNORMAL HIGH (ref 83.0–108.0)
pO2, Arterial: 140 mmHg — ABNORMAL HIGH (ref 83.0–108.0)

## 2019-08-06 LAB — BPAM RBC
Blood Product Expiration Date: 202107082359
Blood Product Expiration Date: 202107212359
Blood Product Expiration Date: 202107212359
Blood Product Expiration Date: 202107212359
Blood Product Expiration Date: 202107212359
Blood Product Expiration Date: 202107222359
Blood Product Expiration Date: 202107232359
Blood Product Expiration Date: 202107232359
Blood Product Expiration Date: 202107232359
Blood Product Expiration Date: 202107242359
Blood Product Expiration Date: 202107242359
Blood Product Expiration Date: 202107242359
Blood Product Expiration Date: 202107242359
Blood Product Expiration Date: 202107242359
Blood Product Expiration Date: 202107242359
Blood Product Expiration Date: 202107242359
Blood Product Expiration Date: 202107242359
Blood Product Expiration Date: 202107242359
Blood Product Expiration Date: 202107252359
Blood Product Expiration Date: 202107252359
Blood Product Expiration Date: 202107252359
ISSUE DATE / TIME: 202106242243
ISSUE DATE / TIME: 202106250632
ISSUE DATE / TIME: 202106250632
ISSUE DATE / TIME: 202106250649
ISSUE DATE / TIME: 202106250649
ISSUE DATE / TIME: 202106250649
ISSUE DATE / TIME: 202106250649
ISSUE DATE / TIME: 202106251030
ISSUE DATE / TIME: 202106251030
ISSUE DATE / TIME: 202106251030
ISSUE DATE / TIME: 202106251030
ISSUE DATE / TIME: 202106252055
ISSUE DATE / TIME: 202106260017
ISSUE DATE / TIME: 202106260746
ISSUE DATE / TIME: 202106260746
ISSUE DATE / TIME: 202106260746
ISSUE DATE / TIME: 202106261403
Unit Type and Rh: 6200
Unit Type and Rh: 6200
Unit Type and Rh: 6200
Unit Type and Rh: 6200
Unit Type and Rh: 6200
Unit Type and Rh: 6200
Unit Type and Rh: 6200
Unit Type and Rh: 6200
Unit Type and Rh: 6200
Unit Type and Rh: 6200
Unit Type and Rh: 6200
Unit Type and Rh: 6200
Unit Type and Rh: 6200
Unit Type and Rh: 6200
Unit Type and Rh: 6200
Unit Type and Rh: 6200
Unit Type and Rh: 6200
Unit Type and Rh: 6200
Unit Type and Rh: 6200
Unit Type and Rh: 6200
Unit Type and Rh: 6200

## 2019-08-06 LAB — TYPE AND SCREEN
ABO/RH(D): A POS
Antibody Screen: NEGATIVE
Unit division: 0
Unit division: 0
Unit division: 0
Unit division: 0
Unit division: 0
Unit division: 0
Unit division: 0
Unit division: 0
Unit division: 0
Unit division: 0
Unit division: 0
Unit division: 0
Unit division: 0
Unit division: 0
Unit division: 0
Unit division: 0
Unit division: 0
Unit division: 0
Unit division: 0
Unit division: 0
Unit division: 0

## 2019-08-06 LAB — CBC
HCT: 29 % — ABNORMAL LOW (ref 39.0–52.0)
HCT: 25 % — ABNORMAL LOW (ref 39.0–52.0)
Hemoglobin: 9.8 g/dL — ABNORMAL LOW (ref 13.0–17.0)
Hemoglobin: 8.2 g/dL — ABNORMAL LOW (ref 13.0–17.0)
MCH: 30.5 pg (ref 26.0–34.0)
MCH: 30.3 pg (ref 26.0–34.0)
MCHC: 33.8 g/dL (ref 30.0–36.0)
MCHC: 32.8 g/dL (ref 30.0–36.0)
MCV: 90.3 fL (ref 80.0–100.0)
MCV: 92.3 fL (ref 80.0–100.0)
Platelets: 47 10*3/uL — ABNORMAL LOW (ref 150–400)
Platelets: 44 10*3/uL — ABNORMAL LOW (ref 150–400)
RBC: 3.21 MIL/uL — ABNORMAL LOW (ref 4.22–5.81)
RBC: 2.71 MIL/uL — ABNORMAL LOW (ref 4.22–5.81)
RDW: 14.8 % (ref 11.5–15.5)
RDW: 15.9 % — ABNORMAL HIGH (ref 11.5–15.5)
WBC: 7 10*3/uL (ref 4.0–10.5)
WBC: 10 10*3/uL (ref 4.0–10.5)
nRBC: 0 % (ref 0.0–0.2)
nRBC: 0.3 % — ABNORMAL HIGH (ref 0.0–0.2)

## 2019-08-06 LAB — PROTIME-INR
INR: 1.2 (ref 0.8–1.2)
Prothrombin Time: 14.6 seconds (ref 11.4–15.2)

## 2019-08-06 LAB — LACTATE DEHYDROGENASE: LDH: 287 U/L — ABNORMAL HIGH (ref 98–192)

## 2019-08-06 LAB — HEPATIC FUNCTION PANEL
ALT: 21 U/L (ref 0–44)
AST: 49 U/L — ABNORMAL HIGH (ref 15–41)
Albumin: 2.1 g/dL — ABNORMAL LOW (ref 3.5–5.0)
Alkaline Phosphatase: 90 U/L (ref 38–126)
Bilirubin, Direct: 3.1 mg/dL — ABNORMAL HIGH (ref 0.0–0.2)
Indirect Bilirubin: 1.5 mg/dL — ABNORMAL HIGH (ref 0.3–0.9)
Total Bilirubin: 4.6 mg/dL — ABNORMAL HIGH (ref 0.3–1.2)
Total Protein: 3.8 g/dL — ABNORMAL LOW (ref 6.5–8.1)

## 2019-08-06 LAB — GLUCOSE, CAPILLARY
Glucose-Capillary: 122 mg/dL — ABNORMAL HIGH (ref 70–99)
Glucose-Capillary: 99 mg/dL (ref 70–99)
Glucose-Capillary: 97 mg/dL (ref 70–99)
Glucose-Capillary: 179 mg/dL — ABNORMAL HIGH (ref 70–99)

## 2019-08-06 LAB — BASIC METABOLIC PANEL
Anion gap: 9 (ref 5–15)
Anion gap: 9 (ref 5–15)
BUN: 27 mg/dL — ABNORMAL HIGH (ref 8–23)
BUN: 24 mg/dL — ABNORMAL HIGH (ref 8–23)
CO2: 28 mmol/L (ref 22–32)
CO2: 26 mmol/L (ref 22–32)
Calcium: 7.2 mg/dL — ABNORMAL LOW (ref 8.9–10.3)
Calcium: 7.6 mg/dL — ABNORMAL LOW (ref 8.9–10.3)
Chloride: 103 mmol/L (ref 98–111)
Chloride: 103 mmol/L (ref 98–111)
Creatinine, Ser: 1.23 mg/dL (ref 0.61–1.24)
Creatinine, Ser: 1.22 mg/dL (ref 0.61–1.24)
GFR calc Af Amer: >60 mL/min (ref >60)
GFR calc Af Amer: >60 mL/min (ref >60)
GFR calc non Af Amer: >60 mL/min (ref >60)
GFR calc non Af Amer: >60 mL/min (ref >60)
Glucose, Bld: 131 mg/dL — ABNORMAL HIGH (ref 70–99)
Glucose, Bld: 206 mg/dL — ABNORMAL HIGH (ref 70–99)
Potassium: 3.5 mmol/L (ref 3.5–5.1)
Potassium: 3.8 mmol/L (ref 3.5–5.1)
Sodium: 140 mmol/L (ref 135–145)
Sodium: 138 mmol/L (ref 135–145)

## 2019-08-06 LAB — TRIGLYCERIDES: Triglycerides: 184 mg/dL — ABNORMAL HIGH (ref <150)

## 2019-08-06 LAB — APTT: aPTT: 94 seconds — ABNORMAL HIGH (ref 24–36)

## 2019-08-06 LAB — PREPARE RBC (CROSSMATCH)

## 2019-08-06 LAB — ECHO INTRAOPERATIVE TEE
Height: 69 in
Weight: 2881.85 oz

## 2019-08-06 LAB — COOXEMETRY PANEL
Carboxyhemoglobin: 1.8 % — ABNORMAL HIGH (ref 0.5–1.5)
Methemoglobin: 2 % — ABNORMAL HIGH (ref 0.0–1.5)
O2 Saturation: 82.8 %
Total hemoglobin: 9.8 g/dL — ABNORMAL LOW (ref 12.0–16.0)

## 2019-08-06 LAB — HEPARIN LEVEL (UNFRACTIONATED): Heparin Unfractionated: <0.1 IU/mL — ABNORMAL LOW (ref 0.30–0.70)

## 2019-08-06 LAB — FIBRINOGEN: Fibrinogen: 418 mg/dL (ref 210–475)

## 2019-08-06 SURGERY — CANNULATION FOR ECMO (EXTRACORPOREAL MEMBRANE OXYGENATION)
Anesthesia: General | Site: Chest | Laterality: Right

## 2019-08-06 MED ORDER — MIDAZOLAM HCL 5 MG/5ML IJ SOLN
INTRAMUSCULAR | Status: DC | PRN
  Administered 2019-08-06: 2 mg via INTRAVENOUS

## 2019-08-06 MED ORDER — 0.9 % SODIUM CHLORIDE (POUR BTL) OPTIME
TOPICAL | Status: DC | PRN
  Administered 2019-08-06 (×2): 1000 mL

## 2019-08-06 MED ORDER — FENTANYL CITRATE (PF) 250 MCG/5ML IJ SOLN
INTRAMUSCULAR | Status: AC
  Filled 2019-08-06: qty 5

## 2019-08-06 MED ORDER — VANCOMYCIN HCL 1000 MG IV SOLR
INTRAVENOUS | Status: DC | PRN
  Administered 2019-08-06: 1000 mL

## 2019-08-06 MED ORDER — POTASSIUM CHLORIDE 10 MEQ/50ML IV SOLN
10.0000 meq | INTRAVENOUS | Status: AC
  Administered 2019-08-06 (×4): 10 meq via INTRAVENOUS
  Filled 2019-08-06 (×4): qty 50

## 2019-08-06 MED ORDER — ACETAZOLAMIDE SODIUM 500 MG IJ SOLR
500.0000 mg | Freq: Once | INTRAMUSCULAR | Status: AC
  Administered 2019-08-06: 500 mg via INTRAVENOUS
  Filled 2019-08-06: qty 500

## 2019-08-06 MED ORDER — PROPOFOL 10 MG/ML IV BOLUS
INTRAVENOUS | Status: AC
  Filled 2019-08-06: qty 20

## 2019-08-06 MED ORDER — ROCURONIUM BROMIDE 10 MG/ML (PF) SYRINGE
PREFILLED_SYRINGE | INTRAVENOUS | Status: AC
  Filled 2019-08-06: qty 20

## 2019-08-06 MED ORDER — SODIUM CHLORIDE 0.9 % IV SOLN
INTRAVENOUS | Status: DC | PRN
  Administered 2019-08-06: 500 mL

## 2019-08-06 MED ORDER — SODIUM BICARBONATE 8.4 % IV SOLN
INTRAVENOUS | Status: AC
  Filled 2019-08-06: qty 50

## 2019-08-06 MED ORDER — CALCIUM CHLORIDE 10 % IV SOLN
INTRAVENOUS | Status: AC
  Filled 2019-08-06: qty 10

## 2019-08-06 MED ORDER — ROCURONIUM BROMIDE 10 MG/ML (PF) SYRINGE
PREFILLED_SYRINGE | INTRAVENOUS | Status: AC
  Filled 2019-08-06: qty 10

## 2019-08-06 MED ORDER — VANCOMYCIN HCL 1000 MG IV SOLR
INTRAVENOUS | Status: AC
  Filled 2019-08-06: qty 1000

## 2019-08-06 MED ORDER — POTASSIUM CHLORIDE 10 MEQ/50ML IV SOLN
10.0000 meq | INTRAVENOUS | Status: AC
  Administered 2019-08-06: 10 meq via INTRAVENOUS
  Filled 2019-08-06 (×3): qty 50

## 2019-08-06 MED ORDER — LACTATED RINGERS IV SOLN: INTRAVENOUS | Status: DC | PRN

## 2019-08-06 MED ORDER — SODIUM CHLORIDE 0.9 % IV SOLN
INTRAVENOUS | Status: DC | PRN
  Administered 2019-08-06 – 2019-08-13 (×2): 250 mL via INTRAVENOUS

## 2019-08-06 MED ORDER — CALCIUM CHLORIDE 10 % IV SOLN
1.0000 g | Freq: Once | INTRAVENOUS | Status: AC
  Administered 2019-08-06: 1 g via INTRAVENOUS

## 2019-08-06 MED ORDER — PHENYLEPHRINE 40 MCG/ML (10ML) SYRINGE FOR IV PUSH (FOR BLOOD PRESSURE SUPPORT)
PREFILLED_SYRINGE | INTRAVENOUS | Status: AC
  Filled 2019-08-06: qty 20

## 2019-08-06 MED ORDER — ROCURONIUM BROMIDE 10 MG/ML (PF) SYRINGE
PREFILLED_SYRINGE | INTRAVENOUS | Status: DC | PRN
  Administered 2019-08-06: 50 mg via INTRAVENOUS
  Administered 2019-08-06: 90 mg via INTRAVENOUS
  Administered 2019-08-06: 50 mg via INTRAVENOUS
  Administered 2019-08-06: 90 mg via INTRAVENOUS

## 2019-08-06 MED ORDER — CALCIUM CHLORIDE 10 % IV SOLN
INTRAVENOUS | Status: DC | PRN
Start: 2019-08-06 — End: 2019-08-06
  Administered 2019-08-06 (×5): 200 mg via INTRAVENOUS

## 2019-08-06 MED ORDER — FENTANYL CITRATE (PF) 250 MCG/5ML IJ SOLN
INTRAMUSCULAR | Status: DC | PRN
  Administered 2019-08-06: 50 ug via INTRAVENOUS
  Administered 2019-08-06: 100 ug via INTRAVENOUS
  Administered 2019-08-06: 50 ug via INTRAVENOUS

## 2019-08-06 MED ORDER — VANCOMYCIN HCL 1000 MG IV SOLR
INTRAVENOUS | Status: DC | PRN
  Administered 2019-08-06: 3 g

## 2019-08-06 MED ORDER — POTASSIUM CHLORIDE 10 MEQ/50ML IV SOLN
10.0000 meq | INTRAVENOUS | Status: AC
  Administered 2019-08-06 – 2019-08-07 (×3): 10 meq via INTRAVENOUS
  Filled 2019-08-06 (×2): qty 50

## 2019-08-06 MED ORDER — VANCOMYCIN HCL 1000 MG IV SOLR
INTRAVENOUS | Status: AC
  Filled 2019-08-06: qty 3000

## 2019-08-06 MED ORDER — POTASSIUM CHLORIDE 10 MEQ/50ML IV SOLN
INTRAVENOUS | Status: AC
  Filled 2019-08-06: qty 50

## 2019-08-06 MED ORDER — VASOPRESSIN 20 UNIT/ML IV SOLN
INTRAVENOUS | Status: AC
  Filled 2019-08-06: qty 1

## 2019-08-06 MED ORDER — LIDOCAINE 2% (20 MG/ML) 5 ML SYRINGE
INTRAMUSCULAR | Status: AC
  Filled 2019-08-06: qty 5

## 2019-08-06 MED ORDER — PROPOFOL 1000 MG/100ML IV EMUL
INTRAVENOUS | Status: AC
  Filled 2019-08-06: qty 100

## 2019-08-06 MED ORDER — HEMOSTATIC AGENTS (NO CHARGE) OPTIME
TOPICAL | Status: DC | PRN
Start: 2019-08-06 — End: 2019-08-06
  Administered 2019-08-06: 1 via TOPICAL

## 2019-08-06 MED ORDER — HEPARIN SODIUM (PORCINE) 5000 UNIT/ML IJ SOLN
50000.0000 [IU] | INTRAVENOUS | Status: DC
  Administered 2019-08-06: 50000 [IU]
  Filled 2019-08-06: qty 10

## 2019-08-06 MED ORDER — HEPARIN SODIUM (PORCINE) 1000 UNIT/ML IJ SOLN
INTRAMUSCULAR | Status: AC
  Filled 2019-08-06: qty 1

## 2019-08-06 MED ORDER — SODIUM CHLORIDE 0.9 % IV SOLN
INTRAVENOUS | Status: AC
  Filled 2019-08-06: qty 1.2

## 2019-08-06 MED ORDER — POTASSIUM CHLORIDE 10 MEQ/50ML IV SOLN
10.0000 meq | INTRAVENOUS | Status: AC
  Administered 2019-08-06: 10 meq via INTRAVENOUS
  Filled 2019-08-06 (×2): qty 50

## 2019-08-06 MED ORDER — SODIUM BICARBONATE 8.4 % IV SOLN
100.0000 meq | Freq: Once | INTRAVENOUS | Status: AC
  Administered 2019-08-06: 100 meq via INTRAVENOUS

## 2019-08-06 MED ORDER — ALBUMIN HUMAN 5 % IV SOLN
25.0000 g | Freq: Once | INTRAVENOUS | Status: AC
  Administered 2019-08-06: 25 g via INTRAVENOUS

## 2019-08-06 MED ORDER — SODIUM CHLORIDE 0.9% IV SOLUTION: Freq: Once | INTRAVENOUS | Status: AC

## 2019-08-06 MED ORDER — AMIODARONE IV BOLUS ONLY 150 MG/100ML
INTRAVENOUS | Status: DC | PRN
  Administered 2019-08-06: 150 mg via INTRAVENOUS

## 2019-08-06 MED ORDER — STERILE WATER FOR INJECTION IJ SOLN
INTRAMUSCULAR | Status: AC
  Administered 2019-08-06: 10 mL
  Filled 2019-08-06: qty 10

## 2019-08-06 MED ORDER — PROPOFOL 10 MG/ML IV BOLUS
INTRAVENOUS | Status: DC | PRN
  Administered 2019-08-06: 30 mg via INTRAVENOUS

## 2019-08-06 MED ORDER — VASOPRESSIN 20 UNIT/ML IV SOLN
INTRAVENOUS | Status: DC | PRN
  Administered 2019-08-06: 1 [IU] via INTRAVENOUS
  Administered 2019-08-06: .5 [IU] via INTRAVENOUS

## 2019-08-06 MED ORDER — HEPARIN SODIUM (PORCINE) 1000 UNIT/ML IJ SOLN
INTRAMUSCULAR | Status: DC | PRN
Start: 2019-08-06 — End: 2019-08-06
  Administered 2019-08-06: 3000 [IU] via INTRAVENOUS

## 2019-08-06 MED ORDER — MIDAZOLAM HCL 2 MG/2ML IJ SOLN
INTRAMUSCULAR | Status: AC
  Filled 2019-08-06: qty 2

## 2019-08-06 MED ORDER — CALCIUM GLUCONATE-NACL 1-0.675 GM/50ML-% IV SOLN
INTRAVENOUS | Status: AC
  Filled 2019-08-06: qty 100

## 2019-08-06 MED ORDER — EPHEDRINE 5 MG/ML INJ
INTRAVENOUS | Status: AC
  Filled 2019-08-06: qty 10

## 2019-08-06 SURGICAL SUPPLY — 111 items
ANCHOR CATH FOLEY SECURE (MISCELLANEOUS) ×15 IMPLANT
APPLIER CLIP 13 LRG OPEN (CLIP)
BAG DECANTER FOR FLEXI CONT (MISCELLANEOUS) ×5 IMPLANT
BANDAGE ESMARK 6X9 LF (GAUZE/BANDAGES/DRESSINGS) ×3 IMPLANT
BATTERY MAXDRIVER (MISCELLANEOUS) ×5 IMPLANT
BENZOIN TINCTURE PRP APPL 2/3 (GAUZE/BANDAGES/DRESSINGS) ×10 IMPLANT
BLADE CLIPPER SURG (BLADE) IMPLANT
BLADE SURG 11 STRL SS (BLADE) IMPLANT
BLADE SURG SZ11 CARB STEEL (BLADE) ×5 IMPLANT
BNDG ESMARK 6X9 LF (GAUZE/BANDAGES/DRESSINGS) ×5
BRUSH SCRUB EZ PLAIN DRY (MISCELLANEOUS) ×15 IMPLANT
CANISTER WOUNDNEG PRESSURE 500 (CANNISTER) ×5 IMPLANT
CANNULA AORTIC ROOT 9FR (CANNULA) ×5 IMPLANT
CANNULA FEMORAL ART 14 SM (MISCELLANEOUS) IMPLANT
CATH ACCU-VU SIZ PIG 5F 100CM (CATHETERS) ×5 IMPLANT
CATH DIAG EXPO 6F VENT PIG 145 (CATHETERS) ×5 IMPLANT
CLIP APPLIE 13 LRG OPEN (CLIP) IMPLANT
CLIP VESOCCLUDE LG 6/CT (CLIP) ×5 IMPLANT
CLIP VESOCCLUDE MED 24/CT (CLIP) ×5 IMPLANT
CLIP VESOCCLUDE SM WIDE 24/CT (CLIP) ×5 IMPLANT
CONN 3/8X3/8 STRAIGHT W/LL (MISCELLANEOUS) IMPLANT
CONN ST 1/4X3/8  BEN (MISCELLANEOUS) ×4
CONN ST 1/4X3/8 BEN (MISCELLANEOUS) ×6 IMPLANT
CONN Y 3/8X3/8X3/8  BEN (MISCELLANEOUS) ×4
CONN Y 3/8X3/8X3/8 BEN (MISCELLANEOUS) ×6 IMPLANT
COVER MAYO STAND STRL (DRAPES) ×10 IMPLANT
COVER PROBE W GEL 5X96 (DRAPES) ×5 IMPLANT
COVER SURGICAL LIGHT HANDLE (MISCELLANEOUS) IMPLANT
DRAIN CHANNEL 28F RND 3/8 FF (WOUND CARE) ×10 IMPLANT
DRAPE C-ARM 42X72 X-RAY (DRAPES) IMPLANT
DRAPE CARDIOVASCULAR INCISE (DRAPES) ×2
DRAPE INCISE IOBAN 66X45 STRL (DRAPES) ×5 IMPLANT
DRAPE SLUSH/WARMER DISC (DRAPES) ×5 IMPLANT
DRAPE SRG 135X102X78XABS (DRAPES) ×3 IMPLANT
DRSG TEGADERM 2-3/8X2-3/4 SM (GAUZE/BANDAGES/DRESSINGS) ×5 IMPLANT
DRSG VAC ATS LRG SENSATRAC (GAUZE/BANDAGES/DRESSINGS) ×5 IMPLANT
ELECT BLADE 4.0 EZ CLEAN MEGAD (MISCELLANEOUS) ×5
ELECT CAUTERY BLADE 6.4 (BLADE) ×5 IMPLANT
ELECT REM PT RETURN 9FT ADLT (ELECTROSURGICAL) ×5
ELECTRODE BLDE 4.0 EZ CLN MEGD (MISCELLANEOUS) ×3 IMPLANT
ELECTRODE REM PT RTRN 9FT ADLT (ELECTROSURGICAL) ×3 IMPLANT
FELT TEFLON 1X6 (MISCELLANEOUS) ×5 IMPLANT
FEMORAL VENOUS CANN RAP (CANNULA) IMPLANT
GAUZE 4X4 16PLY RFD (DISPOSABLE) ×5 IMPLANT
GAUZE SPONGE 4X4 12PLY STRL (GAUZE/BANDAGES/DRESSINGS) ×5 IMPLANT
GAUZE SPONGE 4X4 12PLY STRL LF (GAUZE/BANDAGES/DRESSINGS) ×10 IMPLANT
GLOVE BIO SURGEON STRL SZ 6.5 (GLOVE) ×16 IMPLANT
GLOVE BIO SURGEONS STRL SZ 6.5 (GLOVE) ×4
GLOVE NEODERM STRL 7.5 LF PF (GLOVE) ×9 IMPLANT
GLOVE SURG NEODERM 7.5  LF PF (GLOVE) ×6
GOWN STRL REUS W/ TWL LRG LVL3 (GOWN DISPOSABLE) ×15 IMPLANT
GOWN STRL REUS W/TWL LRG LVL3 (GOWN DISPOSABLE) ×10
GRAFT HEMASHIELD 10MM (Graft) ×2 IMPLANT
GRAFT VASC STRG 30X10STRL (Graft) ×3 IMPLANT
INSERT CLAMP STEALTH SZ3 (MISCELLANEOUS) ×5
INSERT FOGARTY SM (MISCELLANEOUS) ×5 IMPLANT
IV ADAPTER SYR DOUBLE MALE LL (MISCELLANEOUS) ×5 IMPLANT
KIT BASIN OR (CUSTOM PROCEDURE TRAY) ×5 IMPLANT
KIT DILATOR VASC 18G NDL (KITS) IMPLANT
KIT SUCTION CATH 14FR (SUCTIONS) ×5 IMPLANT
LOOP VESSEL MAXI BLUE (MISCELLANEOUS) ×5 IMPLANT
NS IRRIG 1000ML POUR BTL (IV SOLUTION) ×10 IMPLANT
PACK GENERAL/GYN (CUSTOM PROCEDURE TRAY) ×5 IMPLANT
PAD ARMBOARD 7.5X6 YLW CONV (MISCELLANEOUS) ×5 IMPLANT
PENCIL BUTTON HOLSTER BLD 10FT (ELECTRODE) ×5 IMPLANT
PLATE STERNAL 2.3X208 14H 2-PK (Plate) ×5 IMPLANT
PUMP SET IMPELLA 5.5 US (CATHETERS) ×5 IMPLANT
SAVER CELL AUTOCOAG (MISCELLANEOUS) ×3 IMPLANT
SCREW LOCKING TI 2.3X13MM (Screw) ×10 IMPLANT
SCREW STERNAL 2.3X17MM (Screw) ×25 IMPLANT
SCREW STERNAL LOCK 2.3MM (Screw) ×25 IMPLANT
SCRUB POVIDONE IODINE 4 OZ (MISCELLANEOUS) ×5 IMPLANT
SEALANT SURG COSEAL 8ML (VASCULAR PRODUCTS) ×5 IMPLANT
SET CANNULATION TOURNIQUET (MISCELLANEOUS) IMPLANT
SET PRESSURE INFUSION 24 (MISCELLANEOUS) IMPLANT
SHEATH AVANTI 11CM 5FR (SHEATH) IMPLANT
SOL PREP POV-IOD 4OZ 10% (MISCELLANEOUS) ×5 IMPLANT
SPONGE LAP 18X18 X RAY DECT (DISPOSABLE) ×5 IMPLANT
STAPLER VISISTAT 35W (STAPLE) ×5 IMPLANT
STOPCOCK 3 WAY HIGH PRESSURE (MISCELLANEOUS) ×2
STOPCOCK 3WAY HIGH PRESSURE (MISCELLANEOUS) ×3 IMPLANT
SUT PDS AB 1 CTX 36 (SUTURE) ×10 IMPLANT
SUT PROLENE 2 0 MH 48 (SUTURE) ×10 IMPLANT
SUT PROLENE 4 0 RB 1 (SUTURE) ×4
SUT PROLENE 4 0 SH DA (SUTURE) ×5 IMPLANT
SUT PROLENE 4-0 RB1 .5 CRCL 36 (SUTURE) ×6 IMPLANT
SUT PROLENE 5 0 C 1 36 (SUTURE) ×10 IMPLANT
SUT PROLENE 6 0 C 1 30 (SUTURE) IMPLANT
SUT SILK  1 MH (SUTURE) ×12
SUT SILK 1 MH (SUTURE) ×18 IMPLANT
SUT SILK 1 TIES 10X30 (SUTURE) ×5 IMPLANT
SUT SILK 2 0 SH CR/8 (SUTURE) ×5 IMPLANT
SUT SILK 2 0 TIES 10X30 (SUTURE) ×5 IMPLANT
SUT STEEL 6MS V (SUTURE) ×10 IMPLANT
SUT VIC AB 2-0 CT1 27 (SUTURE) ×2
SUT VIC AB 2-0 CT1 TAPERPNT 27 (SUTURE) ×3 IMPLANT
SUT VIC AB 2-0 CTX 36 (SUTURE) ×10 IMPLANT
SUT VIC AB 2-0 UR6 27 (SUTURE) ×5 IMPLANT
SYR 10ML LL (SYRINGE) IMPLANT
SYR 20ML LL LF (SYRINGE) ×5 IMPLANT
SYR 30ML LL (SYRINGE) IMPLANT
SYSTEM SAHARA CHEST DRAIN ATS (WOUND CARE) ×5 IMPLANT
TAPE CLOTH SURG 4X10 WHT LF (GAUZE/BANDAGES/DRESSINGS) ×15 IMPLANT
TOWEL GREEN STERILE (TOWEL DISPOSABLE) ×5 IMPLANT
TOWEL GREEN STERILE FF (TOWEL DISPOSABLE) ×5 IMPLANT
TRAY CATH LUMEN 1 20CM STRL (SET/KITS/TRAYS/PACK) ×5 IMPLANT
TRAY FOLEY MTR SLVR 16FR STAT (SET/KITS/TRAYS/PACK) IMPLANT
TUBING ART PRESS 48 MALE/FEM (TUBING) ×10 IMPLANT
WATER STERILE IRR 1000ML POUR (IV SOLUTION) ×5 IMPLANT
WIRE EMERALD 3MM-J .035X260CM (WIRE) ×5 IMPLANT
WIRE J 3MM .035X145CM (WIRE) ×5 IMPLANT

## 2019-08-06 NOTE — Progress Notes (Signed)
ECMO flows weaned per surgeon with TEE assessment.  Impella 5.5 placed. Impella started on P4 at 1731.  ECMO flows weaned to 1 LPM and then clamped out per Dr. Vickey Sages at [phone].

## 2019-08-06 NOTE — Anesthesia Preprocedure Evaluation (Addendum)
Anesthesia Evaluation  Patient identified by MRN, date of birth, ID band Patient unresponsive    Reviewed: Allergy & Precautions, NPO status , Patient's Chart, lab work & pertinent test results, Unable to perform ROS - Chart review only  Airway Mallampati: Intubated       Dental  (+) Edentulous Upper, Poor Dentition, Missing   Pulmonary Current Smoker and Patient abstained from smoking.,       + intubated    Cardiovascular hypertension, + CAD, + CABG and + Peripheral Vascular Disease  + dysrhythmias Ventricular Fibrillation  Rhythm:Regular Rate:Normal  Pt is s/p Vfib arrest following extubation after CABG, required open cardiac massage, emergent exploration, and ECMO support   On Epi, vasopressin, milrinone, phenylephrine infusions  Post ECMO ECHO: EF 15-20%, mild MR   Neuro/Psych PSYCHIATRIC DISORDERS Depression    GI/Hepatic (+)     substance abuse  marijuana use,   Endo/Other  diabetes, Insulin DependentHypothyroidism   Renal/GU Renal InsufficiencyRenal disease (creat 1.23)     Musculoskeletal  (+) Arthritis ,   Abdominal   Peds  Hematology  (+) Blood dyscrasia (plt 48k, Hb 8.8), anemia ,   Anesthesia Other Findings   Reproductive/Obstetrics                           Anesthesia Physical Anesthesia Plan  ASA: IV  Anesthesia Plan: General   Post-op Pain Management:    Induction: Inhalational  PONV Risk Score and Plan: 1 and Treatment may vary due to age or medical condition  Airway Management Planned: Oral ETT  Additional Equipment: Arterial line, PA Cath and TEE  Intra-op Plan:   Post-operative Plan: Post-operative intubation/ventilation  Informed Consent: I have reviewed the patients History and Physical, chart, labs and discussed the procedure including the risks, benefits and alternatives for the proposed anesthesia with the patient or authorized representative who has  indicated his/her understanding and acceptance.     History available from chart only  Plan Discussed with: CRNA  Anesthesia Plan Comments:       Anesthesia Quick Evaluation

## 2019-08-06 NOTE — Progress Notes (Signed)
ANTICOAGULATION CONSULT NOTE - Follow Up Consult  Pharmacy Consult for heparin Indication: ECMO > impella  Labs: Recent Labs    08/04/19 0513 08/04/19 0527 08/05/19 0343 08/05/19 0343 08/05/19 0405 08/05/19 1414 08/05/19 1604 08/05/19 1611 08/05/19 2147 08/05/19 2155 08/06/19 0340 08/06/19 0400 08/06/19 1801 08/06/19 1801 08/06/19 2014 08/06/19 2024  HGB 8.1*   < > 7.9*   < >   < >  --  10.1*   < >  --    < > 9.8*   < > 10.2*   < > 8.2* 7.8*  HCT 23.2*   < > 22.9*   < >   < >  --  29.4*   < >  --    < > 29.0*   < > 30.0*  --  25.0* 23.0*  PLT 93*   < > 64*   < >  --   --  52*  --   --   --  47*  --   --   --  44*  --   APTT 43*   < > 61*  --    < > 56*  --   --  67*  --  94*  --   --   --   --   --   LABPROT 17.9*  --  16.8*  --   --   --   --   --   --   --  14.6  --   --   --   --   --   INR 1.5*  --  1.4*  --   --   --   --   --   --   --  1.2  --   --   --   --   --   HEPARINUNFRC  --    < > <0.10*  --    < > <0.10*  --   --  <0.10*  --  <0.10*  --   --   --   --   --   CREATININE 1.16   < > 1.28*   < >  --   --  1.28*  --   --   --  1.23  --   --   --  1.22  --    < > = values in this interval not displayed.    Assessment: 64yo male remains subtherapeutic on heparin after rate change though PTT continues to increase; no gtt issues or signs of bleeding per RN; chest tube output "minimal" per RN and pressure drop stable with decreasing ECMO flow overnight.  Decannulated to impella 5.5 P8 today  Non systemic heparin for now Heparin in purge solution 5.71ml/hr = 285uts/hr hgb low 7 post op > prbc stable CT output  Goal of Therapy:  Heparin level 0.3-0.5 units/ml   Plan:  Continue heparin in purge only tonight Follow up in am in systemic heparin needed   Leota Sauers Pharm.D. CPP, BCPS Clinical Pharmacist [phone] 08/06/2019 9:44 PM

## 2019-08-06 NOTE — Progress Notes (Signed)
OT Cancellation Note  Patient Details Name: Langston Tuberville MRN: [id number] DOB: 1955/03/16   Cancelled Treatment:    Reason Eval/Treat Not Completed: Patient not medically ready, currently on ECMO and not medically appropriate for therapies. Will sign off and await re-order as pt is medically able (spoke with RN who confirmed).   Marcy Siren, OT Acute Rehabilitation Services Pager [phone] Office [phone]   [name] 08/06/2019, 12:14 PM

## 2019-08-06 NOTE — Progress Notes (Signed)
Pt's artline  Redressed at this time d/t bleeding and drainage.

## 2019-08-06 NOTE — Progress Notes (Signed)
ANTICOAGULATION CONSULT NOTE - Follow Up Consult  Pharmacy Consult for heparin Indication: ECMO  Labs: Recent Labs    08/04/19 0513 08/04/19 0527 08/05/19 0343 08/05/19 0405 08/05/19 0632 08/05/19 1414 08/05/19 1604 08/05/19 1611 08/05/19 2147 08/05/19 2155 08/05/19 2155 08/06/19 0340 08/06/19 0400  HGB 8.1*   < > 7.9*   < >   < >  --  10.1*   < >  --  9.2*   < > 9.8* 13.9  HCT 23.2*   < > 22.9*   < >   < >  --  29.4*   < >  --  27.0*  --  29.0* 41.0  PLT 93*   < > 64*  --   --   --  52*  --   --   --   --  47*  --   APTT 43*   < > 61*   < >  --  56*  --   --  67*  --   --  94*  --   LABPROT 17.9*  --  16.8*  --   --   --   --   --   --   --   --  14.6  --   INR 1.5*  --  1.4*  --   --   --   --   --   --   --   --  1.2  --   HEPARINUNFRC  --    < > <0.10*   < >  --  <0.10*  --   --  <0.10*  --   --  <0.10*  --   CREATININE 1.16   < > 1.28*  --   --   --  1.28*  --   --   --   --  1.23  --    < > = values in this interval not displayed.    Assessment: 64yo male remains subtherapeutic on heparin after rate change though PTT continues to increase; no gtt issues or signs of bleeding per RN; chest tube output "minimal" per RN and pressure drop stable with decreasing ECMO flow overnight.  Goal of Therapy:  Heparin level 0.3-0.5 units/ml   Plan:  Will increase heparin gtt slightly to 1000 units/hr and check level with next scheduled lab draw.    Vernard Gambles, PharmD, BCPS  08/06/2019,5:49 AM

## 2019-08-06 NOTE — Progress Notes (Signed)
Patient was transported from 2H02 to OR14 on oxygen tank and battery power. Emergency medications and supplies were available at all times. Upon arrival to destination, circuit was connected to wall oxygen source and plugged into a red power outlet. Transport was uneventful.

## 2019-08-06 NOTE — Progress Notes (Signed)
Returned from OR at this time.  OR FiO2 was at 60%.  RT will titrate again as tolerated.

## 2019-08-06 NOTE — Addendum Note (Signed)
Addendum  created 08/06/19 6728 by Adair Laundry, CRNA   Order list changed

## 2019-08-06 NOTE — Progress Notes (Signed)
3 Days Post-Op Procedure(s) (LRB): DEBRIDEMENT WOUND (N/A) Subjective: Sedated  Objective: Vital signs in last 24 hours: Temp:  [97.7 F (36.5 C)-98.1 F (36.7 C)] 97.7 F (36.5 C) (06/28 0700) Pulse Rate:  [81-84] 83 (06/28 0425) Cardiac Rhythm: A-V Sequential paced (06/27 2000) Resp:  [0-17] 0 (06/28 0700) BP: (77-114)/(56-80) 100/67 (06/28 0700) SpO2:  [98 %-100 %] 100 % (06/28 0700) Arterial Line BP: (78-119)/(54-91) 100/61 (06/28 0700) FiO2 (%):  [40 %] 40 % (06/28 0425) Weight:  [81.7 kg] 81.7 kg (06/28 0500)  Hemodynamic parameters for last 24 hours: PAP: (23-36)/(15-26) 29/20 CVP:  [6 mmHg-10 mmHg] 6 mmHg  Intake/Output from previous day: 06/27 0701 - 06/28 0700 In: 5092.5 [I.V.:2575.8; Blood:630; NG/GT:894; IV Piggyback:992.7] Out: 4100 [Urine:2670; Chest Tube:1430] Intake/Output this shift: No intake/output data recorded.  General appearance: sedated Neurologic: unable to assess Heart: regular rate and rhythm, S1, S2 normal, no murmur, click, rub or gallop Lungs: clear to auscultation bilaterally Abdomen: soft, non-tender; bowel sounds normal; no masses,  no organomegaly Extremities: edema 1+ Wound: dressing intact  Lab Results: Recent Labs    08/05/19 1604 08/05/19 1611 08/06/19 0340 08/06/19 0400  WBC 6.7  --  7.0  --   HGB 10.1*   < > 9.8* 13.9  HCT 29.4*   < > 29.0* 41.0  PLT 52*  --  47*  --    < > = values in this interval not displayed.   BMET:  Recent Labs    08/05/19 1604 08/05/19 1611 08/06/19 0340 08/06/19 0400  NA 139   < > 140 141  K 3.6   < > 3.5 3.5  CL 103  --  103  --   CO2 28  --  28  --   GLUCOSE 162*  --  131*  --   BUN 27*  --  27*  --   CREATININE 1.28*  --  1.23  --   CALCIUM 7.2*  --  7.2*  --    < > = values in this interval not displayed.    PT/INR:  Recent Labs    08/06/19 0340  LABPROT 14.6  INR 1.2   ABG    Component Value Date/Time   PHART 7.472 (H) 08/06/2019 0400   HCO3 32.5 (H) 08/06/2019 0400    TCO2 34 (H) 08/06/2019 0400   ACIDBASEDEF 3.0 (H) 08/03/2019 1515   O2SAT 100.0 08/06/2019 0400   CBG (last 3)  Recent Labs    08/05/19 2348 08/06/19 0356 08/06/19 0712  GLUCAP 139* 122* 99    Assessment/Plan: S/P Procedure(s) (LRB): DEBRIDEMENT WOUND (N/A) ECMO decannulation later this afternoon   LOS: 11 days    Todd [name] 08/06/2019

## 2019-08-06 NOTE — Progress Notes (Signed)
  Echocardiogram Echocardiogram Transesophageal has been performed.  Delcie Roch 08/06/2019, 4:25 PM

## 2019-08-06 NOTE — Progress Notes (Signed)
Patient ID: Todd [name], male   DOB: January 28, 1956, 64 y.o.   MRN: [id number]     Advanced Heart Failure Rounding Note  PCP-Cardiologist: No primary care provider on file.   Subjective:    08/02/19 CABG 08/03/19 VF arrest. Chest open at bedside 08/03/19 Back to OR for ECMO cannulation 08/03/19 Washout out for tamponade 08/04/19 Repeat bedside washout for tampoande  Remains intubated/sedated. On NE 14 and milrinone 0.25. HGb improved after transfusion. ECMO flows weaned this am and patient remains stable. ABG stable on sweep '@1' .    [place]wan# CVP 9 PA 28/17  Co-ox 83%   ECMO: Flow 2.94 RPM 2700 dP 14 Pven -32 Sweep1 SVO279%  Labs:  7.41/51/114/98% Hgb9.8 LDH 287 Heparin level: undetectable Lactic acid1.3   Objective:   Weight Range: 81.7 kg Body mass index is 26.6 kg/m.   Vital Signs:   Temp:  [97.7 F (36.5 C)-98.1 F (36.7 C)] 97.9 F (36.6 C) (06/28 1430) Pulse Rate:  [81-84] 83 (06/28 0425) Resp:  [0-20] 12 (06/28 1500) BP: (85-114)/(59-93) 93/61 (06/28 1200) SpO2:  [97 %-100 %] 99 % (06/28 1500) Arterial Line BP: (81-119)/(51-91) 96/60 (06/28 1500) FiO2 (%):  [40 %] 40 % (06/28 1148) Weight:  [81.7 kg] 81.7 kg (06/28 0500) Last BM Date: 07/29/19  Weight change: Filed Weights   08/04/19 0600 08/05/19 0600 08/06/19 0500  Weight: 87 kg 80.4 kg 81.7 kg    Intake/Output:   Intake/Output Summary (Last 24 hours) at 08/06/2019 1538 Last data filed at 08/06/2019 1500 Gross per 24 hour  Intake 4248.24 ml  Output 5348 ml  Net -1099.76 ml      Physical Exam    General:  On vent sedated HEENT: normal + ETT Neck: supple. RIJ swan Carotids 2+ bilat; no bruits. No lymphadenopathy or thryomegaly appreciated. Cor: Chest open with space and dressing in place.  Regular rate & rhythm. No rubs, gallops or murmurs. + AO cannula site ok Lungs: clear  +CTs Abdomen: soft, nontender, nondistended. No hepatosplenomegaly. No bruits or masses. Hypoactive bowel  sounds. Extremities: no cyanosis, clubbing, rash, 3+ edema +RFV cannula ok  Neuro: intubated/sedated   Telemetry   A paced 80-90s  Personally reviewed   Labs    CBC Recent Labs    08/05/19 1604 08/05/19 1611 08/06/19 0340 08/06/19 0400 08/06/19 1257 08/06/19 1314  WBC 6.7  --  7.0  --   --   --   HGB 10.1*   < > 9.8*   < > 9.9* 9.5*  HCT 29.4*   < > 29.0*   < > 29.0* 28.0*  MCV 89.6  --  90.3  --   --   --   PLT 52*  --  47*  --   --   --    < > = values in this interval not displayed.   Basic Metabolic Panel Recent Labs    08/03/19 1616 08/03/19 1829 08/04/19 0513 08/04/19 0527 08/05/19 1604 08/05/19 1611 08/06/19 0340 08/06/19 0400 08/06/19 1257 08/06/19 1314  NA 141   < > 143   < > 139   < > 140   < > 138 137  K 3.3*   < > 3.8   < > 3.6   < > 3.5   < > 3.6 3.6  CL 105   < > 107   < > 103  --  103  --   --   --   CO2 20*   < > 26   < >  28  --  28  --   --   --   GLUCOSE 261*   < > 129*   < > 162*  --  131*  --   --   --   BUN 22   < > 23   < > 27*  --  27*  --   --   --   CREATININE 1.38*   < > 1.16   < > 1.28*  --  1.23  --   --   --   CALCIUM 8.0*   < > 7.2*   < > 7.2*  --  7.2*  --   --   --   MG 2.1  --  1.7  --   --   --   --   --   --   --    < > = values in this interval not displayed.   Liver Function Tests Recent Labs    08/05/19 0343 08/06/19 0340  AST 35 49*  ALT 18 21  ALKPHOS 44 90  BILITOT 3.8* 4.6*  PROT 3.7* 3.8*  ALBUMIN 2.3* 2.1*   No results for input(s): LIPASE, AMYLASE in the last 72 hours. Cardiac Enzymes No results for input(s): CKTOTAL, CKMB, CKMBINDEX, TROPONINI in the last 72 hours.  BNP: BNP (last 3 results) Recent Labs    07/26/19 1236  BNP 2,568.2*    ProBNP (last 3 results) No results for input(s): PROBNP in the last 8760 hours.   D-Dimer No results for input(s): DDIMER in the last 72 hours. Hemoglobin A1C No results for input(s): HGBA1C in the last 72 hours. Fasting Lipid Panel Recent Labs     08/06/19 1201  TRIG 184*   Thyroid Function Tests No results for input(s): TSH, T4TOTAL, T3FREE, THYROIDAB in the last 72 hours.  Invalid input(s): FREET3  Other results:   Imaging    DG Chest 1 View  Result Date: 08/06/2019 CLINICAL DATA:  ECMO.  Ventilator dependence. EXAM: CHEST  1 VIEW COMPARISON:  08/05/2019 FINDINGS: Endotracheal tube tip is 4.8 cm above the base of the carina. No visible air column in the right mainstem bronchus. Right IJ pulmonary artery catheter tip is in the interlobar pulmonary artery, as before. The NG tube passes into the stomach although the distal tip position is not included on the film. Midline mediastinal/pericardial drain again noted with persistent bilateral chest tubes. ECMO catheter evident. No pneumothorax. Similar appearance of the diffuse interstitial and patchy basilar airspace disease, left greater than right. No substantial pleural effusion with left pleural fluid less visible today. Telemetry leads overlie the chest. IMPRESSION: 1. Interval improvement in left pleural effusion. 2. Otherwise no substantial interval change in exam. Diffuse interstitial and basilar airspace disease, left greater than right. 3. Support apparatus as described. Electronically Signed   By: Misty Stanley M.D.   On: 08/06/2019 07:32     Medications:     Scheduled Medications:  [MAR Hold] sodium chloride   Intravenous Once   [MAR Hold] sodium chloride   Intravenous Once   [MAR Hold] sodium chloride   Intravenous Once   [MAR Hold] sodium chloride   Intravenous Once   [MAR Hold] acetaminophen  1,000 mg Oral Q6H   Or   [MAR Hold] acetaminophen (TYLENOL) oral liquid 160 mg/5 mL  1,000 mg Per Tube Q6H   [MAR Hold] artificial tears   Both Eyes Q8H   [MAR Hold] aspirin EC  325 mg Oral Daily   Or   [  MAR Hold] aspirin  324 mg Per Tube Daily   [MAR Hold] bisacodyl  10 mg Oral Daily   Or   [MAR Hold] bisacodyl  10 mg Rectal Daily   [MAR Hold] chlorhexidine  gluconate (MEDLINE KIT)  15 mL Mouth Rinse BID   [MAR Hold] Chlorhexidine Gluconate Cloth  6 each Topical Daily   [MAR Hold] citalopram  20 mg Per Tube Daily   [MAR Hold] docusate  200 mg Per Tube Daily   [MAR Hold] feeding supplement (PIVOT 1.5 CAL)  1,000 mL Per Tube Q24H   [MAR Hold] feeding supplement (PRO-STAT SUGAR FREE 64)  30 mL Per Tube BID   [MAR Hold] fentaNYL (SUBLIMAZE) injection  50 mcg Intravenous Once   [MAR Hold] furosemide  40 mg Intravenous BID   [MAR Hold] insulin aspart  0-24 Units Subcutaneous Q4H   [MAR Hold] mouth rinse  15 mL Mouth Rinse 10 times per day   [MAR Hold] metoCLOPramide (REGLAN) injection  10 mg Intravenous Q6H   [MAR Hold] metoprolol tartrate  12.5 mg Oral BID   Or   [MAR Hold] metoprolol tartrate  12.5 mg Per Tube BID   [MAR Hold] pantoprazole (PROTONIX) IV  40 mg Intravenous QHS   [MAR Hold] rosuvastatin  20 mg Per Tube Daily   [MAR Hold] sodium chloride flush  10-40 mL Intracatheter Q12H   [MAR Hold] sodium chloride flush  3 mL Intravenous Q12H   [MAR Hold] umeclidinium bromide  1 puff Inhalation Daily    Infusions:  sodium chloride 10 mL/hr at 08/03/19 1424   sodium chloride 250 mL (08/03/19 2118)   sodium chloride Stopped (08/04/19 1031)   [MAR Hold] albumin human 12.5 g (08/04/19 1820)   amiodarone 30 mg/hr (08/06/19 1504)   dexmedetomidine (PRECEDEX) IV infusion 0.7 mcg/kg/hr (08/06/19 1504)   dextrose 5 % and 0.45% NaCl     epinephrine 2 mcg/min (08/06/19 1536)   fentaNYL infusion INTRAVENOUS 200 mcg/hr (08/06/19 1504)   [MAR Hold] lactated ringers 500 mL/hr at 08/02/19 1720   lactated ringers 20 mL/hr at 08/02/19 1637   lactated ringers 20 mL/hr at 08/06/19 1500   [MAR Hold] meropenem (MERREM) IV Stopped (08/06/19 1451)   milrinone 0.25 mcg/kg/min (08/06/19 1504)   [MAR Hold] nitroGLYCERIN Stopped (08/03/19 0637)   [MAR Hold] norepinephrine (LEVOPHED) Adult infusion 14 mcg/min (08/06/19 1504)   [MAR  Hold] phenylephrine (NEO-SYNEPHRINE) Adult infusion 0 mcg/min (08/03/19 0900)   [MAR Hold] potassium chloride 10 mEq (08/06/19 1313)   potassium chloride     [MAR Hold] propofol (DIPRIVAN) infusion 40 mcg/kg/min (08/06/19 1504)   [MAR Hold] vancomycin Stopped (08/06/19 0957)   vasopressin (PITRESSIN) infusion - *FOR SHOCK*      PRN Medications: sodium chloride, 0.9 % irrigation (POUR BTL), [MAR Hold] albumin human, [MAR Hold] dextrose, [MAR Hold] fentaNYL, [MAR Hold] lactated ringers, [MAR Hold] metoprolol tartrate, [MAR Hold] midazolam, [MAR Hold]  morphine injection, [MAR Hold] ondansetron (ZOFRAN) IV, [MAR Hold] sodium chloride flush, [MAR Hold] sodium chloride flush, vancomycin 1000 mg in NS (1000 ml) irrigation for Dr. Roxy Manns case   Assessment/Plan   1. CAD: 3VD - s/p CABG x 4 with LIMA-LAD, SVG-D1, SVG-PDA, and radial-OM1 on 6/24.   - Stable. No s/s ischemia.  - Continue ASA and Crestor per tube.  2. Cardiac arrest: VF arrest with prolonged code, VA ECMO begun.  - Rhythm now stable. Continue IV amiodarone at 30 mg/hr.   - Eventual evaluation for ICD.  - Would consider eventual cardiac cath to assess  patency of grafts.  3. Cardiogenic shock: Echo with EF 20-25%, moderate RV dysfunction.  Now on [place] ECMO post-VF arrest, cannulated 6/25.  Stable s/p return to OR for mediastinal re-exploration due to bleeding on 6/25.   - Remains on VA ECMO. Tolerating wean well this am.  - Continue NE 14 and milrinone 0.25  - Co-ox 83% - Volume status remains elevated. Will give IV lasix,  4. Acute hypoxemic respiratory failure: In setting of cardiac arrest.  H/o COPD.   - Stable on vent. Sweep at 1.  - ABG ok 5. Anemia: Now s/p mediastinal re-exploration x 3 - Bleeding appears to be slowing - Hgb 7.1-> 9.8 - Transfuse hgb < 8.  6. Thrombocytopenia:  - PLTs down to 63K -> 47k this am - Follow   - Check HIT 7. Rhythm:  - a paced this am no change 8. ID: Chest open, empirically covering  with meropenem and vancomycin.  - Afebrile - Discussed dosing with PharmD personally.  Plan:  ECMO support being weaned. Patient remains stable Sweep at 1 Remains volume overloaded. Will diurese Hgb stable  Probable decannulation and chest closure this afternoon.   CRITICAL CARE Performed by: Glori Bickers  Total critical care time: 40 minutes  Critical care time was exclusive of separately billable procedures and treating other patients.  Critical care was necessary to treat or prevent imminent or life-threatening deterioration.  Critical care was time spent personally by me on the following activities: development of treatment plan with patient and/or surrogate as well as nursing, discussions with consultants, evaluation of patient's response to treatment, examination of patient, obtaining history from patient or surrogate, ordering and performing treatments and interventions, ordering and review of laboratory studies, ordering and review of radiographic studies, pulse oximetry and re-evaluation of patient's condition.   Length of Stay: 24  Glori Bickers, MD  08/06/2019, 3:38 PM  Advanced Heart Failure Team Pager [phone] (M-F; 7a - 4p)  Please contact [facility] for night-coverage after hours (4p -7a ) and weekends on [email or web address]

## 2019-08-06 NOTE — Progress Notes (Signed)
NAME:  Todd [name], MRN:  [id number], DOB:  09-Nov-1955, LOS: 11 ADMISSION DATE:  07/26/2019, CONSULTATION DATE:  08/03/19 REFERRING MD:  Dorris Fetch, CHIEF COMPLAINT:  ECMO   Brief History   VA ECMO for post CABG Vtach arrest  History of present illness   Presented with worsening dyspnea 6/17 c/w CHF exacerbation Cath with severe triple vessel disease Underwent CABG 6/24 Early this AM Vtach arrest Chest opened bedside and cardiac massage initiated as well as multiple cardioversions, amiodarone, bicarb etc Brought to OR and cannulated for VA ECMO PCCM consulted to assist with management Comorbidities include DM, heavy smoking, COPD  Past Medical History  Depression Ischemic cardiomyopathy HTN HLD  Significant Hospital Events   6/24 CABG 6/25 VA cannulation 6/28 decannulation planned  Consults:  CHF, PCCM, TCTS  Procedures:  See below  Significant Diagnostic Tests:  6/22 spirometry with restrictive physiology, preserved FEV1/FVC CXR today with bilateral airspace disease, cannulas in good position, deep left sulcus that was present yesterday, no clear PTX 6/18 echo: LVEF 20-25%, grade I diastolic dysfunction Micro Data:  COVID and HIV Neg  Antimicrobials:  Vanc6/24>> Cefuroxime 6/24>> 6/25 merrem 6/26->  Interim history/subjective:  6/28: plan for decannulation today.   Objective   Blood pressure 93/61, pulse 83, temperature 97.9 F (36.6 C), resp. rate 12, height 5\' 9"  (1.753 m), weight 81.7 kg, SpO2 99 %. PAP: (23-34)/(15-26) 27/17 CVP:  [6 mmHg-9 mmHg] 6 mmHg  Vent Mode: PRVC FiO2 (%):  [40 %] 40 % Set Rate:  [12 bmp] 12 bmp Vt Set:  [560 mL] 560 mL PEEP:  [5 cmH20] 5 cmH20 Plateau Pressure:  [20 cmH20-27 cmH20] 26 cmH20   Intake/Output Summary (Last 24 hours) at 08/06/2019 1521 Last data filed at 08/06/2019 1500 Gross per 24 hour  Intake 4248.24 ml  Output 5348 ml  Net -1099.76 ml   Filed Weights   08/04/19 0600 08/05/19 0600 08/06/19 0500  Weight:  87 kg 80.4 kg 81.7 kg    Examination: General: ill appearing man on vent HENT: ett in place, minimal secretions Lungs: rhonci bilaterally Cardiovascular: chest open, dressings clear, clearing serosanguinous output from mediastinal drains Abdomen: Soft, hypoactive bs Extremities: mild diffuse edema Neuro: heavily sedated but triggering vent GU: foley in place with dark urine Skin: no rashes  Centrally cannulated arterially, R femoral venous drainage catheter Flows 4LPM, circuit pressures okay Sweep 1, vent waveforms benign Foley, mediastinal drains, OGT in place RIJ MML   ABG looks good Plts 70>>64->47 Hgb 9.6>>7.9->8.8 Cr 1.3>>1.3 LDH 287 Fibrinogen 418 PTT 61->94 CXR persistent airspace bilaterally L>R, personally reviewed by me  Resolved Hospital Problem list   N/A  Assessment & Plan:  S/P VA ECMO cannulation after Vtach arrest in setting of recent CABG. - Heparin slowly being uptitrated - Transfusion goals per TCTS - for decannulation this afternoon - Trend usual indices - Amiodarone per CHF - Would continue broad spectrum abx for now given number of times has needed bedside exploration (3x to date)  Hemorrhagic shock- resolved - Management per TCTS, transfusions as needed, avoid hypothermia, acidemia, coagulopathy as able  On mechanical ventilation for above - Lung protective tidal volumes - Adjust sweep for pH ~7.35-7.7.45: on minimal sweep - Heavy sedation with open chest for now  Ischemic cardiomyopathy - Management per CHF team  DM with hyperglycemia - Insulin endotool  Best practice:  Diet: hold TF in case of decannulation Pain/Anxiety/Delirium protocol (if indicated): fentanyl, propofol, precedex VAP protocol (if indicated): in place DVT prophylaxis:heparin gtt GI prophylaxis:  PPI Glucose control: see above Mobility: BR Code Status: Full Family Communication: per primary Disposition:  ICU  Critical care time: The patient is critically ill  with multiple organ systems failure and requires high complexity decision making for assessment and support, frequent evaluation and titration of therapies, application of advanced monitoring technologies and extensive interpretation of multiple databases.  Critical care time 33 mins. This represents my time independent of the NPs time taking care of the pt. This is excluding procedures.    Audria Nine DO [facility] 08/06/2019, 3:21 PM

## 2019-08-06 NOTE — CV Procedure (Signed)
ECMO NOTE:  Indication: Post-cardiotomy cardiogenic shock  Initial cannulation date: 08/03/19  ECMO type: VA ECMO  1) Central Ao cannulation 2) RFV venous drainage  ECMO events:  08/02/19 CABG 08/03/19 VT arrest. Chest open at bedside 08/03/19 Back to OR for ECMO cannulation 08/03/19 Washout out for tamponade 08/04/19 Repeat bedside washout for tampoande   Daily data:  Flow 2.94 RPM 2700 dP 14 Pven -32 Sweep 1 SVO279%  Labs:  7.41/51/114/98% Hgb9.8 LDH 287 Heparin level: undetectable Lactic acid 1.3  Plan:  ECMO support weaned. Patient remains stable Sweep at 1 Remains volume overloaded. Will diurese Hgb stable  Probable decannulation and chest closure this afternoon.   Arvilla Meres, MD  3:35 PM

## 2019-08-06 NOTE — H&P (Signed)
History and Physical Interval Note:  08/06/2019 2:10 PM  Todd [name]  has presented today for surgery, with the diagnosis of ECMO.  The various methods of treatment have been discussed with the patient and family. After consideration of risks, benefits and other options for treatment, the patient has consented to  Procedure(s): DECANNULATION FOR ECMO (EXTRACORPOREAL MEMBRANE OXYGENATION) (N/A) TRANSESOPHAGEAL ECHOCARDIOGRAM (TEE) (N/A) as a surgical intervention.  The patient's history has been reviewed, patient examined, no change in status, stable for surgery.  I have reviewed the patient's chart and labs.  Questions were answered to the patient's satisfaction.     [name]

## 2019-08-06 NOTE — Progress Notes (Signed)
RT note-Patient taken to the OR for de cannulation, anesthesia ventilation with 100% ambu bag, ventilator is on standby.

## 2019-08-06 NOTE — Brief Op Note (Signed)
08/06/2019  7:39 PM  PATIENT:  Todd [name]  64 y.o. male  PRE-OPERATIVE DIAGNOSIS:  ECMO  POST-OPERATIVE DIAGNOSIS:  ECMO Decannulation  PROCEDURE:  Procedure(s): DECANNULATION FOR ECMO (EXTRACORPOREAL MEMBRANE OXYGENATION) (N/A) TRANSESOPHAGEAL ECHOCARDIOGRAM (TEE) (N/A) PLACEMENT OF IMPELLA LEFT VENTRICULAR ASSIST DEVICE (Right)  SURGEON:  Surgeon(s) and Role:    * [name], MD - Primary  PHYSICIAN ASSISTANT: n/a  ASSISTANTS: staff   ANESTHESIA:   general  EBL:  700 mL   BLOOD ADMINISTERED:500 CC PRBC  DRAINS: 4 Chest Tube(s) in the mediastinum and bilateral pleural spaces   LOCAL MEDICATIONS USED:  NONE  SPECIMEN:  No Specimen  DISPOSITION OF SPECIMEN:  N/A  COUNTS:  YES  TOURNIQUET:  * No tourniquets in log *  DICTATION: .Note written in EPIC  PLAN OF CARE: Admit to inpatient   PATIENT DISPOSITION:  ICU - intubated and critically ill.   Delay start of Pharmacological VTE agent (>24hrs) due to surgical blood loss or risk of bleeding: yes

## 2019-08-06 NOTE — Transfer of Care (Signed)
Immediate Anesthesia Transfer of Care Note  Patient: Todd [name]  Procedure(s) Performed: DECANNULATION FOR ECMO (EXTRACORPOREAL MEMBRANE OXYGENATION) (N/A Chest) TRANSESOPHAGEAL ECHOCARDIOGRAM (TEE) (N/A ) PLACEMENT OF IMPELLA LEFT VENTRICULAR ASSIST DEVICE (Right Axilla)  Patient Location: ICU  Anesthesia Type:General  Level of Consciousness: Patient remains intubated per anesthesia plan  Airway & Oxygen Therapy: Patient remains intubated per anesthesia plan and Patient placed on Ventilator (see vital sign flow sheet for setting)  Post-op Assessment: Report given to RN and Post -op Vital signs reviewed and stable  Post vital signs: Reviewed and stable  Last Vitals:  Vitals Value Taken Time  BP 78/58 08/06/19 1949  Temp    Pulse 93 08/06/19 2000  Resp 13 08/06/19 2000  SpO2 99 % 08/06/19 2000  Vitals shown include unvalidated device data.  Last Pain:  Vitals:   08/05/19 2000  TempSrc: Core  PainSc:       Patients Stated Pain Goal: 0 (07/31/19 2124)  Complications: No complications documented.

## 2019-08-07 ENCOUNTER — Encounter (HOSPITAL_COMMUNITY): Payer: Self-pay | Admitting: Certified Registered Nurse Anesthetist

## 2019-08-07 ENCOUNTER — Inpatient Hospital Stay (HOSPITAL_COMMUNITY): Payer: Medicare Other

## 2019-08-07 ENCOUNTER — Encounter (HOSPITAL_COMMUNITY)
Admission: RE | Disposition: A | Discharge status: Nursing Home (Includes ICF) | Payer: Self-pay | Source: Home / Self Care | Attending: Thoracic Surgery (Cardiothoracic Vascular Surgery)

## 2019-08-07 ENCOUNTER — Encounter (HOSPITAL_COMMUNITY): Payer: Self-pay | Admitting: Cardiothoracic Surgery

## 2019-08-07 DIAGNOSIS — I97638 Postprocedural hematoma of a circulatory system organ or structure following other circulatory system procedure: Secondary | ICD-10-CM

## 2019-08-07 DIAGNOSIS — Z95811 Presence of heart assist device: Secondary | ICD-10-CM

## 2019-08-07 HISTORY — PX: EXPLORATION POST OPERATIVE OPEN HEART: SHX5061

## 2019-08-07 LAB — FIBRINOGEN
Fibrinogen: 278 mg/dL (ref 210–475)
Fibrinogen: 329 mg/dL (ref 210–475)

## 2019-08-07 LAB — POCT I-STAT 7, (LYTES, BLD GAS, ICA,H+H)
Acid-Base Excess: 2 mmol/L (ref 0.0–2.0)
Acid-Base Excess: 2 mmol/L (ref 0.0–2.0)
Acid-Base Excess: 2 mmol/L (ref 0.0–2.0)
Acid-Base Excess: 2 mmol/L (ref 0.0–2.0)
Acid-Base Excess: 3 mmol/L — ABNORMAL HIGH (ref 0.0–2.0)
Acid-Base Excess: 4 mmol/L — ABNORMAL HIGH (ref 0.0–2.0)
Bicarbonate: 25.9 mmol/L (ref 20.0–28.0)
Bicarbonate: 27.9 mmol/L (ref 20.0–28.0)
Bicarbonate: 28.3 mmol/L — ABNORMAL HIGH (ref 20.0–28.0)
Bicarbonate: 28.3 mmol/L — ABNORMAL HIGH (ref 20.0–28.0)
Bicarbonate: 28.6 mmol/L — ABNORMAL HIGH (ref 20.0–28.0)
Bicarbonate: 30.1 mmol/L — ABNORMAL HIGH (ref 20.0–28.0)
Calcium, Ion: 0.98 mmol/L — ABNORMAL LOW (ref 1.15–1.40)
Calcium, Ion: 0.98 mmol/L — ABNORMAL LOW (ref 1.15–1.40)
Calcium, Ion: 1.03 mmol/L — ABNORMAL LOW (ref 1.15–1.40)
Calcium, Ion: 1.03 mmol/L — ABNORMAL LOW (ref 1.15–1.40)
Calcium, Ion: 1.11 mmol/L — ABNORMAL LOW (ref 1.15–1.40)
Calcium, Ion: 1.16 mmol/L (ref 1.15–1.40)
HCT: 23 % — ABNORMAL LOW (ref 39.0–52.0)
HCT: 23 % — ABNORMAL LOW (ref 39.0–52.0)
HCT: 26 % — ABNORMAL LOW (ref 39.0–52.0)
HCT: 26 % — ABNORMAL LOW (ref 39.0–52.0)
HCT: 34 % — ABNORMAL LOW (ref 39.0–52.0)
HCT: 36 % — ABNORMAL LOW (ref 39.0–52.0)
Hemoglobin: 11.6 g/dL — ABNORMAL LOW (ref 13.0–17.0)
Hemoglobin: 12.2 g/dL — ABNORMAL LOW (ref 13.0–17.0)
Hemoglobin: 7.8 g/dL — ABNORMAL LOW (ref 13.0–17.0)
Hemoglobin: 7.8 g/dL — ABNORMAL LOW (ref 13.0–17.0)
Hemoglobin: 8.8 g/dL — ABNORMAL LOW (ref 13.0–17.0)
Hemoglobin: 8.8 g/dL — ABNORMAL LOW (ref 13.0–17.0)
O2 Saturation: 100 %
O2 Saturation: 100 %
O2 Saturation: 96 %
O2 Saturation: 99 %
O2 Saturation: 99 %
O2 Saturation: 99 %
Patient temperature: 35.6
Patient temperature: 36.5
Patient temperature: 36.6
Patient temperature: 36.8
Patient temperature: 37
Patient temperature: 38.8
Potassium: 3.3 mmol/L — ABNORMAL LOW (ref 3.5–5.1)
Potassium: 3.4 mmol/L — ABNORMAL LOW (ref 3.5–5.1)
Potassium: 3.6 mmol/L (ref 3.5–5.1)
Potassium: 4.2 mmol/L (ref 3.5–5.1)
Potassium: 4.2 mmol/L (ref 3.5–5.1)
Potassium: 4.3 mmol/L (ref 3.5–5.1)
Sodium: 137 mmol/L (ref 135–145)
Sodium: 140 mmol/L (ref 135–145)
Sodium: 140 mmol/L (ref 135–145)
Sodium: 141 mmol/L (ref 135–145)
Sodium: 143 mmol/L (ref 135–145)
Sodium: 144 mmol/L (ref 135–145)
TCO2: 27 mmol/L (ref 22–32)
TCO2: 29 mmol/L (ref 22–32)
TCO2: 30 mmol/L (ref 22–32)
TCO2: 30 mmol/L (ref 22–32)
TCO2: 30 mmol/L (ref 22–32)
TCO2: 32 mmol/L (ref 22–32)
pCO2 arterial: 38.6 mmHg (ref 32.0–48.0)
pCO2 arterial: 45.3 mmHg (ref 32.0–48.0)
pCO2 arterial: 48.6 mmHg — ABNORMAL HIGH (ref 32.0–48.0)
pCO2 arterial: 50.2 mmHg — ABNORMAL HIGH (ref 32.0–48.0)
pCO2 arterial: 50.7 mmHg — ABNORMAL HIGH (ref 32.0–48.0)
pCO2 arterial: 55.8 mmHg — ABNORMAL HIGH (ref 32.0–48.0)
pH, Arterial: 7.348 — ABNORMAL LOW (ref 7.350–7.450)
pH, Arterial: 7.351 (ref 7.350–7.450)
pH, Arterial: 7.359 (ref 7.350–7.450)
pH, Arterial: 7.37 (ref 7.350–7.450)
pH, Arterial: 7.398 (ref 7.350–7.450)
pH, Arterial: 7.435 (ref 7.350–7.450)
pO2, Arterial: 144 mmHg — ABNORMAL HIGH (ref 83.0–108.0)
pO2, Arterial: 147 mmHg — ABNORMAL HIGH (ref 83.0–108.0)
pO2, Arterial: 155 mmHg — ABNORMAL HIGH (ref 83.0–108.0)
pO2, Arterial: 209 mmHg — ABNORMAL HIGH (ref 83.0–108.0)
pO2, Arterial: 217 mmHg — ABNORMAL HIGH (ref 83.0–108.0)
pO2, Arterial: 76 mmHg — ABNORMAL LOW (ref 83.0–108.0)

## 2019-08-07 LAB — BPAM FFP
Blood Product Expiration Date: 202107032359
Blood Product Expiration Date: 202107032359
Blood Product Expiration Date: 202107032359
Blood Product Expiration Date: 202107032359
Blood Product Expiration Date: 202107032359
Blood Product Expiration Date: 202107032359
ISSUE DATE / TIME: 202106281440
ISSUE DATE / TIME: 202106281440
ISSUE DATE / TIME: 202106281445
ISSUE DATE / TIME: 202106281445
ISSUE DATE / TIME: 202106282215
ISSUE DATE / TIME: 202106282215
Unit Type and Rh: 600
Unit Type and Rh: 6200
Unit Type and Rh: 6200
Unit Type and Rh: 6200
Unit Type and Rh: 6200
Unit Type and Rh: 6200

## 2019-08-07 LAB — PREPARE FRESH FROZEN PLASMA
Unit division: 0
Unit division: 0
Unit division: 0
Unit division: 0

## 2019-08-07 LAB — BASIC METABOLIC PANEL
Anion gap: 9 (ref 5–15)
Anion gap: 9 (ref 5–15)
BUN: 24 mg/dL — ABNORMAL HIGH (ref 8–23)
BUN: 23 mg/dL (ref 8–23)
CO2: 26 mmol/L (ref 22–32)
CO2: 25 mmol/L (ref 22–32)
Calcium: 7.6 mg/dL — ABNORMAL LOW (ref 8.9–10.3)
Calcium: 6.7 mg/dL — ABNORMAL LOW (ref 8.9–10.3)
Chloride: 102 mmol/L (ref 98–111)
Chloride: 107 mmol/L (ref 98–111)
Creatinine, Ser: 1.35 mg/dL — ABNORMAL HIGH (ref 0.61–1.24)
Creatinine, Ser: 1.12 mg/dL (ref 0.61–1.24)
GFR calc Af Amer: >60 mL/min (ref >60)
GFR calc Af Amer: >60 mL/min (ref >60)
GFR calc non Af Amer: 55 mL/min — ABNORMAL LOW (ref >60)
GFR calc non Af Amer: >60 mL/min (ref >60)
Glucose, Bld: 203 mg/dL — ABNORMAL HIGH (ref 70–99)
Glucose, Bld: 123 mg/dL — ABNORMAL HIGH (ref 70–99)
Potassium: 4.4 mmol/L (ref 3.5–5.1)
Potassium: 3.4 mmol/L — ABNORMAL LOW (ref 3.5–5.1)
Sodium: 137 mmol/L (ref 135–145)
Sodium: 141 mmol/L (ref 135–145)

## 2019-08-07 LAB — GLUCOSE, CAPILLARY
Glucose-Capillary: 102 mg/dL — ABNORMAL HIGH (ref 70–99)
Glucose-Capillary: 121 mg/dL — ABNORMAL HIGH (ref 70–99)
Glucose-Capillary: 135 mg/dL — ABNORMAL HIGH (ref 70–99)
Glucose-Capillary: 204 mg/dL — ABNORMAL HIGH (ref 70–99)
Glucose-Capillary: 227 mg/dL — ABNORMAL HIGH (ref 70–99)
Glucose-Capillary: 97 mg/dL (ref 70–99)

## 2019-08-07 LAB — POCT I-STAT EG7
Acid-base deficit: 2 mmol/L (ref 0.0–2.0)
Bicarbonate: 25.2 mmol/L (ref 20.0–28.0)
Calcium, Ion: 1.03 mmol/L — ABNORMAL LOW (ref 1.15–1.40)
HCT: 29 % — ABNORMAL LOW (ref 39.0–52.0)
Hemoglobin: 9.9 g/dL — ABNORMAL LOW (ref 13.0–17.0)
O2 Saturation: 63 %
Patient temperature: 35.8
Potassium: 3.3 mmol/L — ABNORMAL LOW (ref 3.5–5.1)
Sodium: 124 mmol/L — ABNORMAL LOW (ref 135–145)
TCO2: 27 mmol/L (ref 22–32)
pCO2, Ven: 51.9 mmHg (ref 44.0–60.0)
pH, Ven: 7.288 (ref 7.250–7.430)
pO2, Ven: 34 mmHg (ref 32.0–45.0)

## 2019-08-07 LAB — PREPARE RBC (CROSSMATCH)

## 2019-08-07 LAB — BPAM CRYOPRECIPITATE
Blood Product Expiration Date: 202106290310
ISSUE DATE / TIME: 202106282215
Unit Type and Rh: 6200

## 2019-08-07 LAB — COOXEMETRY PANEL
Carboxyhemoglobin: 1.6 % — ABNORMAL HIGH (ref 0.5–1.5)
Carboxyhemoglobin: 1.3 % (ref 0.5–1.5)
Methemoglobin: 1.4 % (ref 0.0–1.5)
Methemoglobin: 1.5 % (ref 0.0–1.5)
O2 Saturation: 99.1 %
O2 Saturation: 56.8 %
Total hemoglobin: 9.6 g/dL — ABNORMAL LOW (ref 12.0–16.0)
Total hemoglobin: 8.9 g/dL — ABNORMAL LOW (ref 12.0–16.0)

## 2019-08-07 LAB — ECHOCARDIOGRAM LIMITED
Height: 69 in
Weight: 3015.89 oz

## 2019-08-07 LAB — CBC
HCT: 27.5 % — ABNORMAL LOW (ref 39.0–52.0)
HCT: 30.6 % — ABNORMAL LOW (ref 39.0–52.0)
HCT: 36.6 % — ABNORMAL LOW (ref 39.0–52.0)
Hemoglobin: 9.1 g/dL — ABNORMAL LOW (ref 13.0–17.0)
Hemoglobin: 10.2 g/dL — ABNORMAL LOW (ref 13.0–17.0)
Hemoglobin: 12.4 g/dL — ABNORMAL LOW (ref 13.0–17.0)
MCH: 29.4 pg (ref 26.0–34.0)
MCH: 30.2 pg (ref 26.0–34.0)
MCH: 29.4 pg (ref 26.0–34.0)
MCHC: 33.1 g/dL (ref 30.0–36.0)
MCHC: 33.3 g/dL (ref 30.0–36.0)
MCHC: 33.9 g/dL (ref 30.0–36.0)
MCV: 89 fL (ref 80.0–100.0)
MCV: 90.5 fL (ref 80.0–100.0)
MCV: 86.7 fL (ref 80.0–100.0)
Platelets: 58 10*3/uL — ABNORMAL LOW (ref 150–400)
Platelets: 149 10*3/uL — ABNORMAL LOW (ref 150–400)
Platelets: 71 10*3/uL — ABNORMAL LOW (ref 150–400)
RBC: 3.09 MIL/uL — ABNORMAL LOW (ref 4.22–5.81)
RBC: 3.38 MIL/uL — ABNORMAL LOW (ref 4.22–5.81)
RBC: 4.22 MIL/uL (ref 4.22–5.81)
RDW: 14.6 % (ref 11.5–15.5)
RDW: 16.6 % — ABNORMAL HIGH (ref 11.5–15.5)
RDW: 15.1 % (ref 11.5–15.5)
WBC: 10.2 10*3/uL (ref 4.0–10.5)
WBC: 11.2 10*3/uL — ABNORMAL HIGH (ref 4.0–10.5)
WBC: 6.7 10*3/uL (ref 4.0–10.5)
nRBC: 0.2 % (ref 0.0–0.2)
nRBC: 0.4 % — ABNORMAL HIGH (ref 0.0–0.2)
nRBC: 0 % (ref 0.0–0.2)

## 2019-08-07 LAB — MAGNESIUM: Magnesium: 1.4 mg/dL — ABNORMAL LOW (ref 1.7–2.4)

## 2019-08-07 LAB — BPAM PLATELET PHERESIS
Blood Product Expiration Date: 202106302359
ISSUE DATE / TIME: 202106282215
Unit Type and Rh: 6200

## 2019-08-07 LAB — GLOBAL TEG PANEL
CFF Max Amplitude: 17.3 mm (ref 15–32)
CK with Heparinase (R): 8.2 min (ref 4.3–8.3)
Citrated Functional Fibrinogen: 315.7 mg/dL (ref 278–581)
Citrated Kaolin (K): 2.1 min (ref 0.8–2.1)
Citrated Kaolin (MA): 57.7 mm (ref 52–69)
Citrated Kaolin (R): 9.6 min — ABNORMAL HIGH (ref 4.6–9.1)
Citrated Kaolin Angle: 65 deg (ref 63–78)
Citrated Rapid TEG (MA): 55.8 mm (ref 52–70)

## 2019-08-07 LAB — HEPATIC FUNCTION PANEL
ALT: 20 U/L (ref 0–44)
AST: 33 U/L (ref 15–41)
Albumin: 2.7 g/dL — ABNORMAL LOW (ref 3.5–5.0)
Alkaline Phosphatase: 62 U/L (ref 38–126)
Bilirubin, Direct: 1.8 mg/dL — ABNORMAL HIGH (ref 0.0–0.2)
Indirect Bilirubin: 1.4 mg/dL — ABNORMAL HIGH (ref 0.3–0.9)
Total Bilirubin: 3.2 mg/dL — ABNORMAL HIGH (ref 0.3–1.2)
Total Protein: 4.2 g/dL — ABNORMAL LOW (ref 6.5–8.1)

## 2019-08-07 LAB — HEPARIN LEVEL (UNFRACTIONATED)
Heparin Unfractionated: <0.1 IU/mL — ABNORMAL LOW (ref 0.30–0.70)
Heparin Unfractionated: <0.1 IU/mL — ABNORMAL LOW (ref 0.30–0.70)

## 2019-08-07 LAB — PROTIME-INR
INR: 1.3 — ABNORMAL HIGH (ref 0.8–1.2)
INR: 1.2 (ref 0.8–1.2)
Prothrombin Time: 16 seconds — ABNORMAL HIGH (ref 11.4–15.2)
Prothrombin Time: 15 seconds (ref 11.4–15.2)

## 2019-08-07 LAB — PREPARE PLATELET PHERESIS: Unit division: 0

## 2019-08-07 LAB — PREPARE CRYOPRECIPITATE: Unit division: 0

## 2019-08-07 LAB — LACTATE DEHYDROGENASE: LDH: 273 U/L — ABNORMAL HIGH (ref 98–192)

## 2019-08-07 LAB — TRIGLYCERIDES: Triglycerides: 129 mg/dL (ref <150)

## 2019-08-07 LAB — APTT: aPTT: 44 seconds — ABNORMAL HIGH (ref 24–36)

## 2019-08-07 LAB — POCT ACTIVATED CLOTTING TIME: Activated Clotting Time: 131 seconds

## 2019-08-07 SURGERY — EXPLORATION POST OPERATIVE OPEN HEART
Anesthesia: General | Site: Chest

## 2019-08-07 MED ORDER — POTASSIUM CHLORIDE 10 MEQ/50ML IV SOLN
10.0000 meq | INTRAVENOUS | Status: AC
  Administered 2019-08-07 – 2019-08-08 (×3): 10 meq via INTRAVENOUS
  Filled 2019-08-07 (×3): qty 50

## 2019-08-07 MED ORDER — SODIUM CHLORIDE 0.9% IV SOLUTION: Freq: Once | INTRAVENOUS | Status: DC

## 2019-08-07 MED ORDER — HEMOSTATIC AGENTS (NO CHARGE) OPTIME
TOPICAL | Status: DC | PRN
  Administered 2019-08-07 (×3): 1 via TOPICAL

## 2019-08-07 MED ORDER — PLASMA-LYTE 148 IV SOLN: INTRAVENOUS | Status: DC

## 2019-08-07 MED ORDER — SODIUM BICARBONATE 8.4 % IV SOLN
INTRAVENOUS | Status: AC
  Administered 2019-08-07: 50 meq
  Filled 2019-08-07: qty 100

## 2019-08-07 MED ORDER — EPINEPHRINE 1 MG/10ML IJ SOSY
PREFILLED_SYRINGE | INTRAMUSCULAR | Status: AC
  Administered 2019-08-07: 1 mg
  Filled 2019-08-07: qty 10

## 2019-08-07 MED ORDER — VANCOMYCIN HCL 1000 MG IV SOLR
INTRAVENOUS | Status: AC
  Filled 2019-08-07: qty 3000

## 2019-08-07 MED ORDER — MAGNESIUM SULFATE 2 GM/50ML IV SOLN
2.0000 g | Freq: Once | INTRAVENOUS | Status: AC
  Administered 2019-08-07: 2 g via INTRAVENOUS
  Filled 2019-08-07: qty 50

## 2019-08-07 MED ORDER — SODIUM CHLORIDE 0.9% IV SOLUTION: Freq: Once | INTRAVENOUS | Status: AC

## 2019-08-07 MED ORDER — MILRINONE LACTATE IN DEXTROSE 20-5 MG/100ML-% IV SOLN
0.3750 ug/kg/min | INTRAVENOUS | Status: DC
  Administered 2019-08-07 – 2019-08-09 (×4): 0.375 ug/kg/min via INTRAVENOUS
  Filled 2019-08-07 (×4): qty 100

## 2019-08-07 MED ORDER — PROPOFOL 10 MG/ML IV BOLUS
INTRAVENOUS | Status: AC
  Filled 2019-08-07: qty 20

## 2019-08-07 MED ORDER — METHYLENE BLUE 0.5 % INJ SOLN
2.0000 mg/kg | Freq: Once | Status: AC
  Administered 2019-08-07: 171 mg via INTRAVENOUS
  Filled 2019-08-07: qty 34.2

## 2019-08-07 MED ORDER — ALBUMIN HUMAN 5 % IV SOLN
25.0000 g | Freq: Once | INTRAVENOUS | Status: AC
  Administered 2019-08-07: 25 g via INTRAVENOUS

## 2019-08-07 MED ORDER — 0.9 % SODIUM CHLORIDE (POUR BTL) OPTIME
TOPICAL | Status: DC | PRN
  Administered 2019-08-07: 2000 mL

## 2019-08-07 MED ORDER — HEPARIN SODIUM (PORCINE) 5000 UNIT/ML IJ SOLN
50000.0000 [IU] | INTRAVENOUS | Status: DC
  Filled 2019-08-07: qty 10

## 2019-08-07 MED ORDER — AMIODARONE LOAD VIA INFUSION
150.0000 mg | Freq: Once | INTRAVENOUS | Status: AC
  Administered 2019-08-07: 150 mg via INTRAVENOUS
  Filled 2019-08-07: qty 83.34

## 2019-08-07 MED ORDER — ALBUMIN HUMAN 5 % IV SOLN
INTRAVENOUS | Status: AC
  Filled 2019-08-07: qty 250

## 2019-08-07 MED ORDER — ALBUMIN HUMAN 5 % IV SOLN
INTRAVENOUS | Status: AC
  Filled 2019-08-07: qty 500

## 2019-08-07 MED ORDER — SODIUM CHLORIDE 0.9 % IV SOLN
20.0000 ug | Freq: Once | INTRAVENOUS | Status: AC
  Administered 2019-08-07: 20 ug via INTRAVENOUS
  Filled 2019-08-07: qty 5

## 2019-08-07 MED ORDER — POTASSIUM CHLORIDE 10 MEQ/50ML IV SOLN
10.0000 meq | INTRAVENOUS | Status: AC
  Administered 2019-08-07 (×3): 10 meq via INTRAVENOUS
  Filled 2019-08-07: qty 50

## 2019-08-07 MED ORDER — VASOPRESSIN 20 UNIT/ML IV SOLN
INTRAVENOUS | Status: AC
  Filled 2019-08-07: qty 1

## 2019-08-07 MED ORDER — EPINEPHRINE 1 MG/10ML IJ SOSY
PREFILLED_SYRINGE | INTRAMUSCULAR | Status: AC
  Filled 2019-08-07: qty 10

## 2019-08-07 MED ORDER — STERILE WATER FOR IRRIGATION IR SOLN
Status: DC | PRN
Start: 2019-08-07 — End: 2019-08-07

## 2019-08-07 MED ORDER — AMIODARONE LOAD VIA INFUSION
150.0000 mg | Freq: Once | INTRAVENOUS | Status: AC | PRN
  Administered 2019-08-07: 150 mg via INTRAVENOUS
  Filled 2019-08-07: qty 83.34

## 2019-08-07 MED ORDER — DEXTROSE 5 % SOLN FOR IMPELLA PURGE CATHETER
INTRAVENOUS | Status: DC
  Administered 2019-08-07: 500 mL
  Filled 2019-08-07: qty 1000

## 2019-08-07 MED FILL — Magnesium Sulfate Inj 50%: INTRAMUSCULAR | Qty: 10 | Status: AC

## 2019-08-07 MED FILL — Potassium Chloride Inj 2 mEq/ML: INTRAVENOUS | Qty: 40 | Status: AC

## 2019-08-07 MED FILL — Heparin Sodium (Porcine) Inj 1000 Unit/ML: INTRAMUSCULAR | Qty: 30 | Status: AC

## 2019-08-07 SURGICAL SUPPLY — 25 items
BANDAGE ESMARK 6X9 LF (GAUZE/BANDAGES/DRESSINGS) ×1 IMPLANT
BNDG ESMARK 6X9 LF (GAUZE/BANDAGES/DRESSINGS) ×2
DRAPE CHEST BREAST 15X10 FENES (DRAPES) ×2 IMPLANT
DRAPE INCISE IOBAN 66X45 STRL (DRAPES) ×2 IMPLANT
DRAPE TABLE BACK 80X90 (DRAPES) ×2 IMPLANT
FELT TEFLON 1X6 (MISCELLANEOUS) ×2 IMPLANT
GAUZE SPONGE 4X4 12PLY STRL (GAUZE/BANDAGES/DRESSINGS) ×6 IMPLANT
GLOVE BIO SURGEON STRL SZ 6.5 (GLOVE) ×4 IMPLANT
GLOVE BIO SURGEON STRL SZ7.5 (GLOVE) ×2 IMPLANT
GLOVE NEODERM STRL 7.5 LF PF (GLOVE) ×1 IMPLANT
GLOVE SURG NEODERM 7.5  LF PF (GLOVE) ×1
GOWN STRL REUS W/ TWL LRG LVL3 (GOWN DISPOSABLE) ×2 IMPLANT
GOWN STRL REUS W/TWL LRG LVL3 (GOWN DISPOSABLE) ×2
LEAD PACING MYOCARDI (MISCELLANEOUS) ×2 IMPLANT
NS IRRIG 1000ML POUR BTL (IV SOLUTION) ×4 IMPLANT
SPONGE LAP 18X18 RF (DISPOSABLE) ×4 IMPLANT
SUT PROLENE 2 0 MH 48 (SUTURE) ×2 IMPLANT
SUT PROLENE 4 0 SH DA (SUTURE) ×2 IMPLANT
SUT PROLENE 5 0 C 1 36 (SUTURE) ×2 IMPLANT
SUT SILK 2 0 SH CR/8 (SUTURE) ×2 IMPLANT
SYR 30ML LL (SYRINGE) ×2 IMPLANT
SYR BULB IRRIG 60ML STRL (SYRINGE) ×2 IMPLANT
TOWEL GREEN STERILE FF (TOWEL DISPOSABLE) ×4 IMPLANT
TUBE CONNECTING 20X1/4 (TUBING) ×2 IMPLANT
YANKAUER SUCT BULB TIP NO VENT (SUCTIONS) ×2 IMPLANT

## 2019-08-07 NOTE — Anesthesia Postprocedure Evaluation (Signed)
Anesthesia Post Note  Patient: Todd [name]  Procedure(s) Performed: DECANNULATION FOR ECMO (EXTRACORPOREAL MEMBRANE OXYGENATION) (N/A Chest) TRANSESOPHAGEAL ECHOCARDIOGRAM (TEE) (N/A ) PLACEMENT OF IMPELLA LEFT VENTRICULAR ASSIST DEVICE (Right Axilla)     Patient location during evaluation: SICU Anesthesia Type: General Level of consciousness: sedated Pain management: pain level controlled Vital Signs Assessment: post-procedure vital signs reviewed and stable Respiratory status: patient remains intubated per anesthesia plan Cardiovascular status: stable Postop Assessment: no apparent nausea or vomiting Anesthetic complications: no   No complications documented.  Last Vitals:  Vitals:   08/07/19 0329 08/07/19 0358  BP: (!) 103/56   Pulse: 90   Resp: 18 18  Temp: 36.6 C 36.5 C  SpO2:  100%    Last Pain:  Vitals:   08/07/19 0329  TempSrc: Core  PainSc:                  Shelton Silvas

## 2019-08-07 NOTE — Brief Op Note (Signed)
07/26/2019 - 08/07/2019  12:06 PM  PATIENT:  Lowell Bouton  64 y.o. male  PRE-OPERATIVE DIAGNOSIS:  chest wound exploration  POST-OPERATIVE DIAGNOSIS:  chest wound exploration  PROCEDURE:  Procedure(s): EXPLORATION POST OPERATIVE OPEN HEART (N/A)  SURGEON:  Surgeon(s) and Role:    * [name], MD - Primary  PHYSICIAN ASSISTANT: na  ASSISTANTS: staff   ANESTHESIA:   general  EBL:  500 mL  BLOOD ADMINISTERED:none  DRAINS: 4 Chest Tube(s) in the mediastinum and bilateral pleural spaces   LOCAL MEDICATIONS USED:  NONE  SPECIMEN:  No Specimen  DISPOSITION OF SPECIMEN:  N/A  COUNTS:  YES  TOURNIQUET:  * No tourniquets in log *  DICTATION: .Note written in EPIC  PLAN OF CARE: Admit to inpatient   PATIENT DISPOSITION:  ICU - intubated and hemodynamically stable.   Delay start of Pharmacological VTE agent (>24hrs) due to surgical blood loss or risk of bleeding: yes

## 2019-08-07 NOTE — OR Nursing (Signed)
Bedside emergency procedure in 2H02. Case start at 1105am and ended at 1152am. Procedure preformed by Dr. Valentina Lucks.

## 2019-08-07 NOTE — Progress Notes (Signed)
  Echocardiogram 2D Echocardiogram has been performed.  Todd [name] 08/07/2019, 8:05 AM

## 2019-08-07 NOTE — CV Procedure (Signed)
Radial arterial line placement.    Emergent consent assumed. The righ AC fossa was prepped and draped in the routine sterile fashion a single lumen radial arterial catheter was placed in the right brachial artery using a modified Seldinger technique. Good blood flow and wave forms. A dressing was placed.     Arvilla Meres, MD  10:55 AM

## 2019-08-07 NOTE — Anesthesia Preprocedure Evaluation (Deleted)
Anesthesia Evaluation  Patient identified by MRN, date of birth, ID band Patient unresponsive    Reviewed: Allergy & Precautions, H&P , NPO status , Patient's Chart, lab work & pertinent test results  Airway        Dental no notable dental hx.    Pulmonary Current Smoker and Patient abstained from smoking.,  Intubated and sedated   Pulmonary exam normal        Cardiovascular hypertension, Pt. on medications + CAD, + Cardiac Stents, + CABG and +CHF       Neuro/Psych Depression negative neurological ROS     GI/Hepatic negative GI ROS, Neg liver ROS,   Endo/Other  diabetes, Type 2, Oral Hypoglycemic AgentsHypothyroidism   Renal/GU negative Renal ROS  negative genitourinary   Musculoskeletal  (+) Arthritis , Osteoarthritis,    Abdominal   Peds  Hematology negative hematology ROS (+)   Anesthesia Other Findings   Reproductive/Obstetrics negative OB ROS                            Anesthesia Physical Anesthesia Plan  ASA: IV  Anesthesia Plan: General   Post-op Pain Management:    Induction: Intravenous  PONV Risk Score and Plan: 1 and Midazolam  Airway Management Planned: Oral ETT  Additional Equipment:   Intra-op Plan:   Post-operative Plan: Post-operative intubation/ventilation  Informed Consent: I have reviewed the patients History and Physical, chart, labs and discussed the procedure including the risks, benefits and alternatives for the proposed anesthesia with the patient or authorized representative who has indicated his/her understanding and acceptance.     Dental advisory given  Plan Discussed with: CRNA  Anesthesia Plan Comments:         Anesthesia Quick Evaluation

## 2019-08-07 NOTE — Progress Notes (Signed)
1 Day Post-Op Procedure(s) (LRB): DECANNULATION FOR ECMO (EXTRACORPOREAL MEMBRANE OXYGENATION) (N/A) TRANSESOPHAGEAL ECHOCARDIOGRAM (TEE) (N/A) PLACEMENT OF IMPELLA LEFT VENTRICULAR ASSIST DEVICE (Right) Subjective: sedated Objective: Vital signs in last 24 hours: Temp:  [91.9 F (33.3 C)-98.6 F (37 C)] 97.7 F (36.5 C) (06/29 0615) Pulse Rate:  [90-95] 90 (06/29 0329) Cardiac Rhythm: Atrial paced (06/29 0400) Resp:  [0-20] 18 (06/29 0615) BP: (69-106)/(56-93) 104/69 (06/29 0400) SpO2:  [85 %-100 %] 100 % (06/29 0545) Arterial Line BP: (81-114)/(51-74) 101/68 (06/29 0244) FiO2 (%):  [40 %-60 %] 60 % (06/29 0307) Weight:  [85.5 kg] 85.5 kg (06/29 0530)  Hemodynamic parameters for last 24 hours: PAP: (22-43)/(9-26) 23/11 CVP:  [5 mmHg-18 mmHg] 8 mmHg CO:  [3.5 L/min-6.4 L/min] 3.5 L/min CI:  [1.8 L/min/m2-3.4 L/min/m2] 1.8 L/min/m2  Intake/Output from previous day: 06/28 0701 - 06/29 0700 In: 11581.8 [I.V.:6497.3; Blood:3777.8; IV Piggyback:1237] Out: 7798 [Urine:3490; Emesis/NG output:250; Drains:320; Stool:3; Blood:700; Chest Tube:3035] Intake/Output this shift: Total I/O In: 7931.2 [I.V.:4549.8; Blood:2572.8; Other:57.6; IV Piggyback:751] Out: 3845 [Urine:740; Emesis/NG output:250; Drains:320; Chest Tube:2535]  General appearance: intubated/sedated Neurologic: unable to assess Heart: regular rate and rhythm, S1, S2 normal, no murmur, click, rub or gallop Lungs: clear to auscultation bilaterally Abdomen: soft, non-tender; bowel sounds normal; no masses,  no organomegaly Extremities: cool Wound: dressed  Lab Results: Recent Labs    08/06/19 2014 08/06/19 2024 08/07/19 0347 08/07/19 0504  WBC 10.0  --  10.2  --   HGB 8.2*   < > 9.1* 7.8*  HCT 25.0*   < > 27.5* 23.0*  PLT 44*  --  58*  --    < > = values in this interval not displayed.   BMET:  Recent Labs    08/06/19 2014 08/06/19 2024 08/07/19 0347 08/07/19 0504  NA 138   < > 137 140  K 3.8   < > 4.4  4.2  CL 103  --  102  --   CO2 26  --  26  --   GLUCOSE 206*  --  203*  --   BUN 24*  --  24*  --   CREATININE 1.22  --  1.35*  --   CALCIUM 7.6*  --  7.6*  --    < > = values in this interval not displayed.    PT/INR:  Recent Labs    08/07/19 0347  LABPROT 16.0*  INR 1.3*   ABG    Component Value Date/Time   PHART 7.370 08/07/2019 0504   HCO3 28.3 (H) 08/07/2019 0504   TCO2 30 08/07/2019 0504   ACIDBASEDEF 3.0 (H) 08/03/2019 1515   O2SAT 99.0 08/07/2019 0504   CBG (last 3)  Recent Labs    08/06/19 2038 08/06/19 2358 08/07/19 0403  GLUCAP 179* 227* 204*    Assessment/Plan: S/P Procedure(s) (LRB): DECANNULATION FOR ECMO (EXTRACORPOREAL MEMBRANE OXYGENATION) (N/A) TRANSESOPHAGEAL ECHOCARDIOGRAM (TEE) (N/A) PLACEMENT OF IMPELLA LEFT VENTRICULAR ASSIST DEVICE (Right) echo to assess Impella position  Continue to resuscitate   LOS: 12 days    Todd [name] 08/07/2019

## 2019-08-07 NOTE — Progress Notes (Signed)
TCTS called regarding suction alarms on Impella with decreased BP, low/decreased pulsatility, and increase CT output. Impella decreased from P8 to P6 for suction alarms. CVP 5, CI 1.7. MD order to titrate epi to 8, keep impella at P6, and give albumin and RBCs.

## 2019-08-07 NOTE — Progress Notes (Signed)
  Patient with worsening hemodynamic compromise requiring several epinephrine boluses and full dose pressors to maintain BP/perfusion.   I repeated echo at bedside. Echo findings consistent with mediastinal hematoma/loculated pericardial effusion compressing the RV with tamponade physiology.   Only option for survival is immediate chest exploration and evacuation of hematoma/effusion with decompression of RV.   I discussed with Dr. Vickey Sages who agrees. OR team in room. Chest being prepped for emergent re-exploration. Emergent consent assumed.   Arvilla Meres, MD  11:02 AM

## 2019-08-07 NOTE — Plan of Care (Signed)
  Problem: Education: Goal: Knowledge of General Education information will improve Description: Including pain rating scale, medication(s)/side effects and non-pharmacologic comfort measures Outcome: Progressing   Problem: Health Behavior/Discharge Planning: Goal: Ability to manage health-related needs will improve Outcome: Progressing   Problem: Clinical Measurements: Goal: Ability to maintain clinical measurements within normal limits will improve Outcome: Progressing Goal: Will remain free from infection Outcome: Progressing Goal: Diagnostic test results will improve Outcome: Progressing Goal: Respiratory complications will improve Outcome: Progressing Goal: Cardiovascular complication will be avoided Outcome: Progressing   Problem: Activity: Goal: Risk for activity intolerance will decrease Outcome: Progressing   Problem: Nutrition: Goal: Adequate nutrition will be maintained Outcome: Progressing   Problem: Coping: Goal: Level of anxiety will decrease Outcome: Progressing   Problem: Elimination: Goal: Will not experience complications related to bowel motility Outcome: Progressing Goal: Will not experience complications related to urinary retention Outcome: Progressing   Problem: Pain Managment: Goal: General experience of comfort will improve Outcome: Progressing   Problem: Safety: Goal: Ability to remain free from injury will improve Outcome: Progressing   Problem: Skin Integrity: Goal: Risk for impaired skin integrity will decrease Outcome: Progressing   Problem: Education: Goal: Ability to demonstrate management of disease process will improve Outcome: Progressing Goal: Ability to verbalize understanding of medication therapies will improve Outcome: Progressing Goal: Individualized Educational Video(s) Outcome: Progressing   Problem: Activity: Goal: Capacity to carry out activities will improve Outcome: Progressing   Problem: Cardiac: Goal:  Ability to achieve and maintain adequate cardiopulmonary perfusion will improve Outcome: Progressing   Problem: Education: Goal: Will demonstrate proper wound care and an understanding of methods to prevent future damage Outcome: Progressing Goal: Knowledge of disease or condition will improve Outcome: Progressing Goal: Knowledge of the prescribed therapeutic regimen will improve Outcome: Progressing Goal: Individualized Educational Video(s) Outcome: Progressing   Problem: Activity: Goal: Risk for activity intolerance will decrease Outcome: Progressing   Problem: Cardiac: Goal: Will achieve and/or maintain hemodynamic stability Outcome: Progressing   Problem: Clinical Measurements: Goal: Postoperative complications will be avoided or minimized Outcome: Progressing   Problem: Respiratory: Goal: Respiratory status will improve Outcome: Progressing   Problem: Skin Integrity: Goal: Wound healing without signs and symptoms of infection Outcome: Progressing Goal: Risk for impaired skin integrity will decrease Outcome: Progressing   Problem: Urinary Elimination: Goal: Ability to achieve and maintain adequate renal perfusion and functioning will improve Outcome: Progressing   

## 2019-08-07 NOTE — Progress Notes (Signed)
Patient ID: Todd [name], male   DOB: 12-Feb-1955, 64 y.o.   MRN: [id number]     Advanced Heart Failure Rounding Note  PCP-Cardiologist: No primary care provider on file.   Subjective:    08/02/19 CABG 08/03/19 VF arrest. Chest open at bedside 08/03/19 Back to OR for ECMO cannulation 08/03/19 Washout out for tamponade 08/04/19 Repeat bedside washout for tamponade 08/06/19 To OR for decannulation and Impella 5.5  Overnight diffuse bleeding from CT sites. Got multiple units of blood products, PLTs, DDAVP and cryo.   On NE, epi and VP. Remains hypotensive  Impella at P-5 due to suction events. Flow ~ 2.5L Waveform ok   Swan outputs done personally CI 1.4    Objective:   Weight Range: 85.5 kg Body mass index is 27.84 kg/m.   Vital Signs:   Temp:  [91.9 F (33.3 C)-98.6 F (37 C)] 97.3 F (36.3 C) (06/29 0710) Pulse Rate:  [90-95] 90 (06/29 0329) Resp:  [0-20] 18 (06/29 0710) BP: (69-104)/(56-73) 84/56 (06/29 0710) SpO2:  [85 %-100 %] 90 % (06/29 0728) Arterial Line BP: (92-114)/(59-74) 101/68 (06/29 0244) FiO2 (%):  [40 %-100 %] 100 % (06/29 0934) Weight:  [85.5 kg] 85.5 kg (06/29 0530) Last BM Date: 08/06/19  Weight change: Filed Weights   08/05/19 0600 08/06/19 0500 08/07/19 0530  Weight: 80.4 kg 81.7 kg 85.5 kg    Intake/Output:   Intake/Output Summary (Last 24 hours) at 08/07/2019 1042 Last data filed at 08/07/2019 1000 Gross per 24 hour  Intake 12398.55 ml  Output 6582 ml  Net 5816.55 ml      Physical Exam    General:  On vent sedated. Critically ill appearing HEENT: + ETT Neck: supple. RIJ swan. Carotids 2+ bilat; no bruits. Cor: Regular tachy wound vac in place multiple CTs. 1 with active bleeding around it + R axillary impella Lungs: coarse Abdomen: obese soft, nontender, nondistended. No hepatosplenomegaly. No bruits or masses.  Extremities: no cyanosis, clubbing, rash, 3+ edema (weeping) Neuro: intubated sedated    Telemetry   Stach 110-120  Personally reviewed    Labs    CBC Recent Labs    08/07/19 0347 08/07/19 0347 08/07/19 0504 08/07/19 0910  WBC 10.2  --   --  11.2*  HGB 9.1*   < > 7.8* 10.2*  HCT 27.5*   < > 23.0* 30.6*  MCV 89.0  --   --  90.5  PLT 58*  --   --  149*   < > = values in this interval not displayed.   Basic Metabolic Panel Recent Labs    08/06/19 2014 08/06/19 2024 08/07/19 0347 08/07/19 0504  NA 138   < > 137 140  K 3.8   < > 4.4 4.2  CL 103  --  102  --   CO2 26  --  26  --   GLUCOSE 206*  --  203*  --   BUN 24*  --  24*  --   CREATININE 1.22  --  1.35*  --   CALCIUM 7.6*  --  7.6*  --    < > = values in this interval not displayed.   Liver Function Tests Recent Labs    08/06/19 0340 08/07/19 0347  AST 49* 33  ALT 21 20  ALKPHOS 90 62  BILITOT 4.6* 3.2*  PROT 3.8* 4.2*  ALBUMIN 2.1* 2.7*   No results for input(s): LIPASE, AMYLASE in the last 72 hours. Cardiac Enzymes No results for input(s): CKTOTAL,  CKMB, CKMBINDEX, TROPONINI in the last 72 hours.  BNP: BNP (last 3 results) Recent Labs    07/26/19 1236  BNP 2,568.2*    ProBNP (last 3 results) No results for input(s): PROBNP in the last 8760 hours.   D-Dimer No results for input(s): DDIMER in the last 72 hours. Hemoglobin A1C No results for input(s): HGBA1C in the last 72 hours. Fasting Lipid Panel Recent Labs    08/07/19 0350  TRIG 129   Thyroid Function Tests No results for input(s): TSH, T4TOTAL, T3FREE, THYROIDAB in the last 72 hours.  Invalid input(s): FREET3  Other results:   Imaging    DG Chest 1 View  Result Date: 08/07/2019 CLINICAL DATA:  Intubation. EXAM: CHEST  1 VIEW COMPARISON:  Chest x-ray 08/06/2019. FINDINGS: Endotracheal tube, NG tube, Swan-Ganz catheter, bilateral chest tubes in stable position. Impella device in stable position. Prior CABG. Cardiomegaly. Mild bilateral interstitial prominence again noted small bilateral pleural effusions again noted. Findings consistent with  CHF. Similar findings noted on prior exam. No pneumothorax. IMPRESSION: 1. Lines and tubes including bilateral chest tubes in stable position. No pneumothorax. 2. Prior CABG. Cardiomegaly with bilateral interstitial prominence and bilateral pleural effusions again noted consistent with CHF. Similar findings noted on prior exam. Bibasilar atelectasis. Electronically Signed   By: Marcello Moores  Register   On: 08/07/2019 08:07   DG CHEST PORT 1 VIEW  Result Date: 08/06/2019 CLINICAL DATA:  Status post endotracheal tube placement EXAM: PORTABLE CHEST 1 VIEW COMPARISON:  June 28 21 FINDINGS: An Impella device is noted projecting over the heart. The Swan-Ganz catheter tip projects over the main right pulmonary artery. The endotracheal tube terminates above the carina by approximately 4.7 cm. The enteric tube extends below the left hemidiaphragm. A left-sided chest tube is noted. There is no definite pneumothorax. There are bilateral pleural effusions, right greater than left. The heart size remains enlarged. The patient is status post prior median sternotomy. There is vascular congestion with mild pulmonary edema. IMPRESSION: 1. Support devices as above. 2. Bilateral pleural effusions with interstitial edema. 3. No significant pneumothorax. Electronically Signed   By: Constance Holster M.D.   On: 08/06/2019 20:26   DG Abd Portable 1V  Result Date: 08/06/2019 CLINICAL DATA:  OG tube placement. EXAM: PORTABLE ABDOMEN - 1 VIEW COMPARISON:  None. FINDINGS: The OG tube projects over the gastric body. The bowel gas pattern is nonobstructive and nonspecific. IMPRESSION: OG tube projects over the gastric body. Electronically Signed   By: Constance Holster M.D.   On: 08/06/2019 20:27   DG C-Arm 1-60 Min-No Report  Result Date: 08/06/2019 Fluoroscopy was utilized by the requesting physician.  No radiographic interpretation.   ECHOCARDIOGRAM LIMITED  Result Date: 08/07/2019    ECHOCARDIOGRAM LIMITED REPORT   Patient Name:    Todd [name] Date of Exam: 08/07/2019 Medical Rec #:  [id number]  Height:       69.0 in Accession #:    [id number] Weight:       188.5 lb Date of Birth:  1955/06/12 BSA:          2.015 m Patient Age:    64 years   BP:           84/56 mmHg Patient Gender: M          HR:           89 bpm. Exam Location:  Inpatient Procedure: Limited Echo and Limited Color Doppler STAT ECHO Indications:    impella placement check  History:  Patient has prior history of Echocardiogram examinations, most                 recent 08/06/2019.                  Limited study for left ventricular function and Impella cannula                 positioning.                  The left ventricle is normal in size. Left ventricular systolic                 function is severely depressed, with an estimated ejection                 fraction of 15-20%.                 The septum is severely dyskinetic, the apex is akinetic. Lateral                 wall contractility is relatively preserved.                  The right ventricle appears decompressed/underfilled. Right                 ventricular systolic function is probably moderately depressed.                  There is enhanced respiratory displacement of the ventricular                 septum (increased ventricular interdependence) that may                 represent a degree of constrictive or tamponade physiology. A                 septal "bounce" is seen, also suggestive of constrictive                 physiology. Further hemodynamic analysis is limited on this                 study.                  There is a small to moderate effusion anterior and lateral to                 the right ventricle, probably loculated. There appears to be a                 small to moderate amount of circumferential thrombus in the                 pericardial cavity.                 An echogenic structure in the anterior pericardial fluid may be                 a drain or epicardial pacing lead.                   There is a pacemaker wire or catheter in the right heart                 chambers.                 The Impella cannula is well positioned, approximately 5 cm  across the aortic annulus.                 No aortic insufficiency is seen.  Sonographer:    Johny Chess Referring Phys: [id number] ATKINS  Sanda Klein MD Electronically signed by Sanda Klein MD Signature Date/Time: 08/07/2019/8:45:55 AM    Final      Medications:     Scheduled Medications:  sodium chloride   Intravenous Once   sodium chloride   Intravenous Once   sodium chloride   Intravenous Once   acetaminophen  1,000 mg Oral Q6H   Or   acetaminophen (TYLENOL) oral liquid 160 mg/5 mL  1,000 mg Per Tube Q6H   artificial tears   Both Eyes Q8H   aspirin EC  325 mg Oral Daily   Or   aspirin  324 mg Per Tube Daily   bisacodyl  10 mg Oral Daily   Or   bisacodyl  10 mg Rectal Daily   chlorhexidine gluconate (MEDLINE KIT)  15 mL Mouth Rinse BID   Chlorhexidine Gluconate Cloth  6 each Topical Daily   citalopram  20 mg Per Tube Daily   docusate  200 mg Per Tube Daily   EPINEPHrine       feeding supplement (PIVOT 1.5 CAL)  1,000 mL Per Tube Q24H   feeding supplement (PRO-STAT SUGAR FREE 64)  30 mL Per Tube BID   fentaNYL (SUBLIMAZE) injection  50 mcg Intravenous Once   furosemide  40 mg Intravenous BID   insulin aspart  0-24 Units Subcutaneous Q4H   mouth rinse  15 mL Mouth Rinse 10 times per day   pantoprazole (PROTONIX) IV  40 mg Intravenous QHS   rosuvastatin  20 mg Per Tube Daily   sodium bicarbonate       sodium chloride flush  10-40 mL Intracatheter Q12H   sodium chloride flush  3 mL Intravenous Q12H    Infusions:  sodium chloride 10 mL/hr at 08/07/19 0300   sodium chloride 1 mL/hr at 08/07/19 0300   sodium chloride 1 mL/hr at 08/07/19 0300   sodium chloride Stopped (08/07/19 0637)   albumin human     amiodarone 30 mg/hr (08/07/19 0700)    dexmedetomidine (PRECEDEX) IV infusion 0.7 mcg/kg/hr (08/07/19 0700)   epinephrine 8 mcg/min (08/07/19 0700)   fentaNYL infusion INTRAVENOUS 200 mcg/hr (08/07/19 0700)   impella catheter heparin 50 unit/mL in dextrose 5%     lactated ringers 500 mL/hr at 08/07/19 0300   lactated ringers 20 mL/hr at 08/07/19 0300   lactated ringers 20 mL/hr at 08/07/19 0700   meropenem (MERREM) IV 200 mL/hr at 08/07/19 0700   milrinone     norepinephrine (LEVOPHED) Adult infusion 42 mcg/min (08/07/19 0700)   propofol (DIPRIVAN) infusion 20 mcg/kg/min (08/07/19 0700)   vancomycin 1,000 mg (08/07/19 0918)   vasopressin (PITRESSIN) infusion - *FOR SHOCK* 0.05 Units/min (08/07/19 0700)    PRN Medications: sodium chloride, sodium chloride, 0.9 % irrigation (POUR BTL), dextrose, fentaNYL, lactated ringers, metoprolol tartrate, midazolam, morphine injection, ondansetron (ZOFRAN) IV, sodium chloride flush, sodium chloride flush, sterile water for irrigation   Assessment/Plan   1. Cardiogenic shock: Echo with EF 20-25%, moderate RV dysfunction.  Now on [place] ECMO post-VF arrest, cannulated 6/25.  Stable s/p return to OR for mediastinal re-exploration due to bleeding on 6/25.   - Decannulated from [place] ECMO on 08/06/19. Impella 5.5 placed - Has worsening shock. Now on full-dose pressors. I performed echo at bedside and appears to have mediastinal  hematoma compressing RV. D/w Dr. Orvan Seen will plan to open chest at bedside to relieve RV comprression - Impella viewed personally under echo and pulled back slightly. Waveforms ok. Flow limited by poor RV function 2. CAD: 3VD - s/p CABG x 4 with LIMA-LAD, SVG-D1, SVG-PDA, and radial-OM1 on 6/24.   - Continue ASA and Crestor per tube.  3. Cardiac arrest: VF arrest with prolonged code - Rhythm now stable. Continue IV amiodarone at 30 mg/hr.   - Eventual evaluation for ICD.   - Would consider eventual cardiac cath to assess patency of grafts if we can get to that  point 4. Acute hypoxemic respiratory failure: In setting of cardiac arrest.  H/o COPD.   - On vent CCM following. ABGs reviewed 5. Acute blood loss anemia: Now s/p mediastinal re-exploration x 3 - Continues to bleed. - Dr. Orvan Seen to open chest today 6. Thrombocytopenia:  - Transfusing PLTs 7. ID: Chest open, empirically covering with meropenem and vancomycin.   Remains critically ill. Prognosis very concerning.   CRITICAL CARE Performed by: Glori Bickers  Total critical care time: 120 minutes at bedside  Critical care time was exclusive of separately billable procedures and treating other patients.  Critical care was necessary to treat or prevent imminent or life-threatening deterioration.  Critical care was time spent personally by me on the following activities: development of treatment plan with patient and/or surrogate as well as nursing, discussions with consultants, evaluation of patient's response to treatment, examination of patient, obtaining history from patient or surrogate, ordering and performing treatments and interventions, ordering and review of laboratory studies, ordering and review of radiographic studies, pulse oximetry and re-evaluation of patient's condition.   Length of Stay: [place], MD  08/07/2019, 10:42 AM  Advanced Heart Failure Team Pager [phone] (M-F; 7a - 4p)  Please contact [facility] for night-coverage after hours (4p -7a ) and weekends on [email or web address]

## 2019-08-07 NOTE — Progress Notes (Signed)
NAME:  Todd [name], MRN:  [id number], DOB:  1955/04/24, LOS: 12 ADMISSION DATE:  07/26/2019, CONSULTATION DATE:  08/03/19 REFERRING MD:  Dorris Fetch, CHIEF COMPLAINT:  ECMO   Brief History   VA ECMO for post CABG Vtach arrest  History of present illness   Presented with worsening dyspnea 6/17 c/w CHF exacerbation Cath with severe triple vessel disease Underwent CABG 6/24 Early this AM Vtach arrest Chest opened bedside and cardiac massage initiated as well as multiple cardioversions, amiodarone, bicarb etc Brought to OR and cannulated for VA ECMO PCCM consulted to assist with management Comorbidities include DM, heavy smoking, COPD  Past Medical History  Depression Ischemic cardiomyopathy HTN HLD  Significant Hospital Events   6/24 CABG 6/25 VA cannulation 6/28 decannulated  Consults:  CHF, PCCM, TCTS  Procedures:  See below  Significant Diagnostic Tests:  6/22 spirometry with restrictive physiology, preserved FEV1/FVC CXR today with bilateral airspace disease, cannulas in good position, deep left sulcus that was present yesterday, no clear PTX 6/18 echo: LVEF 20-25%, grade I diastolic dysfunction Micro Data:  COVID and HIV Neg  Antimicrobials:  Vanc6/24>> Cefuroxime 6/24>> 6/25 merrem 6/26->  Interim history/subjective:  6/29: s/p decannulation. Placement of impella device originally at p8 but decreased to p4 2/2 suction events overnight up to p6. Echo completed and repositioned device this am. Remains on considerable support with 8 epi and 46 norepi vaso 0.05. continued chest tube output with large clots noted. Heparin thru device. Replacement products ongoing. 60% 8 peep 6/28: plan for decannulation today.   Objective   Blood pressure (!) 84/56, pulse 90, temperature (!) 97.3 F (36.3 C), temperature source Core, resp. rate 18, height 5\' 9"  (1.753 m), weight 85.5 kg, SpO2 90 %. PAP: (22-43)/(9-26) 23/11 CVP:  [5 mmHg-18 mmHg] 8 mmHg CO:  [3.5 L/min-6.4 L/min]  3.5 L/min CI:  [1.8 L/min/m2-3.4 L/min/m2] 1.8 L/min/m2  Vent Mode: PRVC FiO2 (%):  [40 %-60 %] 60 % Set Rate:  [1 bmp-18 bmp] 18 bmp Vt Set:  [560 mL] 560 mL PEEP:  [5 cmH20-10 cmH20] 8 cmH20 Plateau Pressure:  [26 cmH20-42 cmH20] 33 cmH20   Intake/Output Summary (Last 24 hours) at 08/07/2019 0748 Last data filed at 08/07/2019 0730 Gross per 24 hour  Intake 12043.41 ml  Output 8178 ml  Net 3865.41 ml   Filed Weights   08/05/19 0600 08/06/19 0500 08/07/19 0530  Weight: 80.4 kg 81.7 kg 85.5 kg    Examination: General: ill appearing man on vent HENT: ett in place, minimal secretions Lungs: rhonci bilaterally Cardiovascular: mechanical hs, chest tubes inplace with bloody drainage and clots noted.  Abdomen: Soft, hypoactive bs Extremities: mild diffuse edema Neuro: remains heavily sedated but triggering vent GU: foley in place with dark urine Skin: no rashes   Plts 70>>64->47->58 Hgb 9.6>>7.9->8.8->7.8 Cr 1.3>>1.3 LDH 287->273 PTT 61->94->44 CXR improved airspace disease but some increase fluid noted on R in the fissure, personally reviewed by me coox 56.8  Resolved Hospital Problem list   N/A  Assessment & Plan:  S/P VA ECMO cannulation after Vtach arrest in setting of recent CABG. -decannulation with implantation of impella 6/28 - Heparin gtt thru device - Transfusion goals per TCTS - Trend usual indices - Amiodarone per CHF - Would continue broad spectrum abx for now given number of times has needed bedside exploration (3x to date)  Hemorrhagic/cardiogenic shock- -hemorrhagic appears to have improved but oozing quite a bit, cont to replace products.  -vasopressors/inotropes actively titrated.  - Management per TCTS, transfusions as  needed, avoid hypothermia, acidemia, coagulopathy as able  On mechanical ventilation for above - Lung protective tidal volumes -not a good day to wean sedation in light of hemodynamics and suction events.   Ischemic  cardiomyopathy - Management per CHF team  DM with hyperglycemia - Insulin endotool  Best practice:  Diet: ok to resume TF at this time.  Pain/Anxiety/Delirium protocol (if indicated): fentanyl, propofol, precedex VAP protocol (if indicated): in place DVT prophylaxis:heparin gtt via device GI prophylaxis: PPI Glucose control: see above Mobility: BR Code Status: Full Family Communication: per primary Disposition:  ICU  Critical care time: The patient is critically ill with multiple organ systems failure and requires high complexity decision making for assessment and support, frequent evaluation and titration of therapies, application of advanced monitoring technologies and extensive interpretation of multiple databases.  Critical care time 34 mins. This represents my time independent of the NPs time taking care of the pt. This is excluding procedures.    Briant Sites DO [facility] 08/07/2019, 7:48 AM

## 2019-08-07 NOTE — Progress Notes (Signed)
CT surgery p.m. Rounds  Patient had stable day after mediastinal reexploration and cleanout of hematoma.  Chest tube output remains satisfactory off heparin.  1 unit platelets infusing for platelet count of 80,000 run of nonsustained VT, amiodarone rebolus and potassium is being supplemented Magnesium 2 g ordered

## 2019-08-07 NOTE — Addendum Note (Signed)
Addendum  created 08/07/19 0803 by Adair Laundry, CRNA   Order list changed

## 2019-08-08 ENCOUNTER — Inpatient Hospital Stay (HOSPITAL_COMMUNITY): Payer: Medicare Other

## 2019-08-08 ENCOUNTER — Encounter (HOSPITAL_COMMUNITY): Payer: Self-pay | Admitting: Thoracic Surgery (Cardiothoracic Vascular Surgery)

## 2019-08-08 LAB — TYPE AND SCREEN
ABO/RH(D): A POS
Antibody Screen: NEGATIVE
Unit division: 0
Unit division: 0
Unit division: 0
Unit division: 0
Unit division: 0
Unit division: 0
Unit division: 0
Unit division: 0
Unit division: 0
Unit division: 0
Unit division: 0
Unit division: 0
Unit division: 0
Unit division: 0
Unit division: 0
Unit division: 0
Unit division: 0
Unit division: 0
Unit division: 0
Unit division: 0
Unit division: 0

## 2019-08-08 LAB — CBC
HCT: 35.3 % — ABNORMAL LOW (ref 39.0–52.0)
HCT: 34.8 % — ABNORMAL LOW (ref 39.0–52.0)
Hemoglobin: 12.1 g/dL — ABNORMAL LOW (ref 13.0–17.0)
Hemoglobin: 12.2 g/dL — ABNORMAL LOW (ref 13.0–17.0)
MCH: 29.4 pg (ref 26.0–34.0)
MCH: 30 pg (ref 26.0–34.0)
MCHC: 34.3 g/dL (ref 30.0–36.0)
MCHC: 35.1 g/dL (ref 30.0–36.0)
MCV: 85.7 fL (ref 80.0–100.0)
MCV: 85.5 fL (ref 80.0–100.0)
Platelets: 88 10*3/uL — ABNORMAL LOW (ref 150–400)
Platelets: 119 10*3/uL — ABNORMAL LOW (ref 150–400)
RBC: 4.12 MIL/uL — ABNORMAL LOW (ref 4.22–5.81)
RBC: 4.07 MIL/uL — ABNORMAL LOW (ref 4.22–5.81)
RDW: 15.6 % — ABNORMAL HIGH (ref 11.5–15.5)
RDW: 15.4 % (ref 11.5–15.5)
WBC: 11.1 10*3/uL — ABNORMAL HIGH (ref 4.0–10.5)
WBC: 16.8 10*3/uL — ABNORMAL HIGH (ref 4.0–10.5)
nRBC: 0.2 % (ref 0.0–0.2)
nRBC: 0.2 % (ref 0.0–0.2)

## 2019-08-08 LAB — COMPREHENSIVE METABOLIC PANEL
ALT: 19 U/L (ref 0–44)
ALT: 16 U/L (ref 0–44)
AST: 44 U/L — ABNORMAL HIGH (ref 15–41)
AST: 49 U/L — ABNORMAL HIGH (ref 15–41)
Albumin: 2.3 g/dL — ABNORMAL LOW (ref 3.5–5.0)
Albumin: 2.1 g/dL — ABNORMAL LOW (ref 3.5–5.0)
Alkaline Phosphatase: 67 U/L (ref 38–126)
Alkaline Phosphatase: 91 U/L (ref 38–126)
Anion gap: 11 (ref 5–15)
Anion gap: 11 (ref 5–15)
BUN: 22 mg/dL (ref 8–23)
BUN: 21 mg/dL (ref 8–23)
CO2: 22 mmol/L (ref 22–32)
CO2: 22 mmol/L (ref 22–32)
Calcium: 7 mg/dL — ABNORMAL LOW (ref 8.9–10.3)
Calcium: 7.3 mg/dL — ABNORMAL LOW (ref 8.9–10.3)
Chloride: 106 mmol/L (ref 98–111)
Chloride: 103 mmol/L (ref 98–111)
Creatinine, Ser: 1.22 mg/dL (ref 0.61–1.24)
Creatinine, Ser: 1.18 mg/dL (ref 0.61–1.24)
GFR calc Af Amer: >60 mL/min (ref >60)
GFR calc Af Amer: >60 mL/min (ref >60)
GFR calc non Af Amer: >60 mL/min (ref >60)
GFR calc non Af Amer: >60 mL/min (ref >60)
Glucose, Bld: 114 mg/dL — ABNORMAL HIGH (ref 70–99)
Glucose, Bld: 153 mg/dL — ABNORMAL HIGH (ref 70–99)
Potassium: 3.8 mmol/L (ref 3.5–5.1)
Potassium: 3.9 mmol/L (ref 3.5–5.1)
Sodium: 139 mmol/L (ref 135–145)
Sodium: 136 mmol/L (ref 135–145)
Total Bilirubin: 4.2 mg/dL — ABNORMAL HIGH (ref 0.3–1.2)
Total Bilirubin: 4.3 mg/dL — ABNORMAL HIGH (ref 0.3–1.2)
Total Protein: 4 g/dL — ABNORMAL LOW (ref 6.5–8.1)
Total Protein: 4 g/dL — ABNORMAL LOW (ref 6.5–8.1)

## 2019-08-08 LAB — POCT I-STAT 7, (LYTES, BLD GAS, ICA,H+H)
Acid-base deficit: 1 mmol/L (ref 0.0–2.0)
Bicarbonate: 21.8 mmol/L (ref 20.0–28.0)
Calcium, Ion: 1.03 mmol/L — ABNORMAL LOW (ref 1.15–1.40)
HCT: 35 % — ABNORMAL LOW (ref 39.0–52.0)
Hemoglobin: 11.9 g/dL — ABNORMAL LOW (ref 13.0–17.0)
O2 Saturation: 96 %
Patient temperature: 37.1
Potassium: 3.5 mmol/L (ref 3.5–5.1)
Sodium: 137 mmol/L (ref 135–145)
TCO2: 23 mmol/L (ref 22–32)
pCO2 arterial: 28.5 mmHg — ABNORMAL LOW (ref 32.0–48.0)
pH, Arterial: 7.493 — ABNORMAL HIGH (ref 7.350–7.450)
pO2, Arterial: 71 mmHg — ABNORMAL LOW (ref 83.0–108.0)

## 2019-08-08 LAB — BPAM RBC
Blood Product Expiration Date: 202107232359
Blood Product Expiration Date: 202107232359
Blood Product Expiration Date: 202107232359
Blood Product Expiration Date: 202107242359
Blood Product Expiration Date: 202107252359
Blood Product Expiration Date: 202107262359
Blood Product Expiration Date: 202107272359
Blood Product Expiration Date: 202107272359
Blood Product Expiration Date: 202107272359
Blood Product Expiration Date: 202107282359
Blood Product Expiration Date: 202107282359
Blood Product Expiration Date: 202107282359
Blood Product Expiration Date: 202107282359
Blood Product Expiration Date: 202107282359
Blood Product Expiration Date: 202107282359
Blood Product Expiration Date: 202107282359
Blood Product Expiration Date: 202107282359
Blood Product Expiration Date: 202107282359
Blood Product Expiration Date: 202107282359
Blood Product Expiration Date: 202107282359
Blood Product Expiration Date: 202107282359
ISSUE DATE / TIME: 202106270755
ISSUE DATE / TIME: 202106270755
ISSUE DATE / TIME: 202106281436
ISSUE DATE / TIME: 202106281436
ISSUE DATE / TIME: 202106282215
ISSUE DATE / TIME: 202106282215
ISSUE DATE / TIME: 202106290217
ISSUE DATE / TIME: 202106290217
ISSUE DATE / TIME: 202106290632
ISSUE DATE / TIME: 202106290632
ISSUE DATE / TIME: 202106290632
ISSUE DATE / TIME: 202106291045
ISSUE DATE / TIME: 202106291045
ISSUE DATE / TIME: 202106291045
ISSUE DATE / TIME: 202106291045
ISSUE DATE / TIME: 202106291125
ISSUE DATE / TIME: 202106291125
Unit Type and Rh: 6200
Unit Type and Rh: 6200
Unit Type and Rh: 6200
Unit Type and Rh: 6200
Unit Type and Rh: 6200
Unit Type and Rh: 6200
Unit Type and Rh: 6200
Unit Type and Rh: 6200
Unit Type and Rh: 6200
Unit Type and Rh: 6200
Unit Type and Rh: 6200
Unit Type and Rh: 6200
Unit Type and Rh: 6200
Unit Type and Rh: 6200
Unit Type and Rh: 6200
Unit Type and Rh: 6200
Unit Type and Rh: 6200
Unit Type and Rh: 6200
Unit Type and Rh: 6200
Unit Type and Rh: 6200
Unit Type and Rh: 6200

## 2019-08-08 LAB — PREPARE PLATELET PHERESIS
Unit division: 0
Unit division: 0
Unit division: 0
Unit division: 0

## 2019-08-08 LAB — PREPARE FRESH FROZEN PLASMA
Unit division: 0
Unit division: 0

## 2019-08-08 LAB — BPAM PLATELET PHERESIS
Blood Product Expiration Date: 202107012359
Blood Product Expiration Date: 202107022359
Blood Product Expiration Date: 202107022359
Blood Product Expiration Date: 202107012359
ISSUE DATE / TIME: 202106290634
ISSUE DATE / TIME: 202106290839
ISSUE DATE / TIME: 202106291049
ISSUE DATE / TIME: 202106291717
Unit Type and Rh: 600
Unit Type and Rh: 7300
Unit Type and Rh: 7300
Unit Type and Rh: 7300

## 2019-08-08 LAB — PREPARE CRYOPRECIPITATE: Unit division: 0

## 2019-08-08 LAB — BPAM FFP
Blood Product Expiration Date: 202107032359
Blood Product Expiration Date: 202107032359
Blood Product Expiration Date: 202107042359
Blood Product Expiration Date: 202107042359
ISSUE DATE / TIME: 202106291049
ISSUE DATE / TIME: 202106291049
ISSUE DATE / TIME: 202106291049
ISSUE DATE / TIME: 202106291049
Unit Type and Rh: 2800
Unit Type and Rh: 6200
Unit Type and Rh: 6200
Unit Type and Rh: 8400

## 2019-08-08 LAB — BPAM CRYOPRECIPITATE
Blood Product Expiration Date: 202106291626
ISSUE DATE / TIME: 202106291058
Unit Type and Rh: 6200

## 2019-08-08 LAB — HEPARIN LEVEL (UNFRACTIONATED): Heparin Unfractionated: <0.1 IU/mL — ABNORMAL LOW (ref 0.30–0.70)

## 2019-08-08 LAB — APTT: aPTT: 39 seconds — ABNORMAL HIGH (ref 24–36)

## 2019-08-08 LAB — GLUCOSE, CAPILLARY
Glucose-Capillary: 114 mg/dL — ABNORMAL HIGH (ref 70–99)
Glucose-Capillary: 89 mg/dL (ref 70–99)
Glucose-Capillary: 146 mg/dL — ABNORMAL HIGH (ref 70–99)
Glucose-Capillary: 144 mg/dL — ABNORMAL HIGH (ref 70–99)
Glucose-Capillary: 122 mg/dL — ABNORMAL HIGH (ref 70–99)

## 2019-08-08 LAB — VANCOMYCIN, TROUGH: Vancomycin Tr: 33 ug/mL (ref 15–20)

## 2019-08-08 MED ORDER — HEPARIN SODIUM (PORCINE) 5000 UNIT/ML IJ SOLN
50000.0000 [IU] | INTRAVENOUS | Status: DC
  Administered 2019-08-08 – 2019-08-12 (×2): 50000 [IU]
  Filled 2019-08-08 (×4): qty 10

## 2019-08-08 MED ORDER — VANCOMYCIN VARIABLE DOSE PER UNSTABLE RENAL FUNCTION (PHARMACIST DOSING): Status: DC

## 2019-08-08 MED ORDER — POTASSIUM CHLORIDE 20 MEQ PO PACK
20.0000 meq | PACK | Freq: Two times a day (BID) | ORAL | Status: DC
  Administered 2019-08-08 – 2019-08-10 (×3): 20 meq via ORAL
  Filled 2019-08-08 (×4): qty 1

## 2019-08-08 MED ORDER — POTASSIUM CHLORIDE 10 MEQ/50ML IV SOLN
10.0000 meq | INTRAVENOUS | Status: AC
  Administered 2019-08-08 (×3): 10 meq via INTRAVENOUS
  Filled 2019-08-08 (×3): qty 50

## 2019-08-08 MED ORDER — CALCIUM GLUCONATE-NACL 2-0.675 GM/100ML-% IV SOLN
2.0000 g | Freq: Once | INTRAVENOUS | Status: AC
  Administered 2019-08-08: 2000 mg via INTRAVENOUS
  Filled 2019-08-08: qty 100

## 2019-08-08 MED ORDER — ADULT MULTIVITAMIN W/MINERALS CH
1.0000 | ORAL_TABLET | Freq: Every day | ORAL | Status: DC
  Administered 2019-08-08 – 2019-08-11 (×3): 1
  Filled 2019-08-08 (×4): qty 1

## 2019-08-08 MED ORDER — FUROSEMIDE 10 MG/ML IJ SOLN
80.0000 mg | Freq: Once | INTRAMUSCULAR | Status: AC
  Administered 2019-08-08: 80 mg via INTRAVENOUS
  Filled 2019-08-08: qty 8

## 2019-08-08 MED ORDER — FUROSEMIDE 10 MG/ML IJ SOLN
4.0000 mg/h | INTRAVENOUS | Status: DC
  Administered 2019-08-08 – 2019-08-10 (×4): 8 mg/h via INTRAVENOUS
  Administered 2019-08-11: 4 mg/h via INTRAVENOUS
  Filled 2019-08-08 (×5): qty 25

## 2019-08-08 MED ORDER — PRO-STAT SUGAR FREE PO LIQD
60.0000 mL | Freq: Three times a day (TID) | ORAL | Status: DC
  Administered 2019-08-08 – 2019-08-11 (×6): 60 mL
  Filled 2019-08-08 (×8): qty 60

## 2019-08-08 MED ORDER — ALBUMIN HUMAN 5 % IV SOLN
12.5000 g | Freq: Once | INTRAVENOUS | Status: AC
  Administered 2019-08-08: 12.5 g via INTRAVENOUS
  Filled 2019-08-08: qty 250

## 2019-08-08 MED FILL — Sodium Chloride IV Soln 0.9%: INTRAVENOUS | Qty: 5000 | Status: AC

## 2019-08-08 MED FILL — Sodium Bicarbonate IV Soln 8.4%: INTRAVENOUS | Qty: 50 | Status: AC

## 2019-08-08 MED FILL — Lidocaine HCl Local Soln Prefilled Syringe 100 MG/5ML (2%): INTRAMUSCULAR | Qty: 5 | Status: AC

## 2019-08-08 MED FILL — Electrolyte-R (PH 7.4) Solution: INTRAVENOUS | Qty: 6000 | Status: AC

## 2019-08-08 MED FILL — Mannitol IV Soln 20%: INTRAVENOUS | Qty: 500 | Status: AC

## 2019-08-08 MED FILL — Heparin Sodium (Porcine) Inj 1000 Unit/ML: INTRAMUSCULAR | Qty: 20 | Status: AC

## 2019-08-08 NOTE — Progress Notes (Signed)
TCTS Evening Rounds Stable day Making good UOP in response to diuresis Good CO; not bleeding A/P: continue present management Consider closing chest tomorrow pm Todd [name] Z. Vickey Sages, MD [phone]

## 2019-08-08 NOTE — Progress Notes (Signed)
NAME:  Todd [name], MRN:  [id number], DOB:  [birth year], LOS: 13 ADMISSION DATE:  07/26/2019, CONSULTATION DATE:  08/03/19 REFERRING MD:  Dorris Fetch, CHIEF COMPLAINT:  ECMO   Brief History   VA ECMO for post CABG Vtach arrest  History of present illness   Presented with worsening dyspnea 6/17 c/w CHF exacerbation Cath with severe triple vessel disease Underwent CABG 6/24 Early this AM Vtach arrest Chest opened bedside and cardiac massage initiated as well as multiple cardioversions, amiodarone, bicarb etc Brought to OR and cannulated for VA ECMO PCCM consulted to assist with management Comorbidities include DM, heavy smoking, COPD  Past Medical History  Depression Ischemic cardiomyopathy HTN HLD  Significant Hospital Events   6/24 CABG 6/25 VA cannulation 6/28 decannulated and impella placed 6/29 bedside re-exploration  Consults:  CHF, PCCM, TCTS  Procedures:  See below  Significant Diagnostic Tests:  6/22 spirometry with restrictive physiology, preserved FEV1/FVC CXR today with bilateral airspace disease, cannulas in good position, deep left sulcus that was present yesterday, no clear PTX 6/18 echo: LVEF 20-25%, grade I diastolic dysfunction Micro Data:  COVID and HIV Neg  Antimicrobials:  Vanc6/24>> Cefuroxime 6/24>> 6/25 merrem 6/26->  Interim history/subjective:  6/30: bedside mediastinal re-exploration and clean out of hematoma with tamponade and worsening hemodynamics. Pt had large volume transfusion. rebolused with amio 2/2 nsvt episode.   6/29: s/p decannulation. Placement of impella device originally at p8 but decreased to p4 2/2 suction events overnight up to p6. Echo completed and repositioned device this am. Remains on considerable support with 8 epi and 46 norepi vaso 0.05. continued chest tube output with large clots noted. Heparin thru device. Replacement products ongoing. 60% 8 peep 6/28: plan for decannulation today.   Objective   Blood pressure  108/67, pulse 87, temperature 99.1 F (37.3 C), resp. rate (!) 24, height 5\' 9"  (1.753 m), weight 95 kg, SpO2 98 %. PAP: (27-47)/(16-26) 38/23 CVP:  [10 mmHg-18 mmHg] 13 mmHg CO:  [2.7 L/min-5 L/min] 4.7 L/min CI:  [1.4 L/min/m2-2.7 L/min/m2] 2.5 L/min/m2  Vent Mode: PCV FiO2 (%):  [40 %-100 %] 40 % Set Rate:  [24 bmp] 24 bmp PEEP:  [5 cmH20] 5 cmH20 Plateau Pressure:  [29 cmH20-32 cmH20] 32 cmH20   Intake/Output Summary (Last 24 hours) at 08/08/2019 0739 Last data filed at 08/08/2019 0700 Gross per 24 hour  Intake 8536 ml  Output 5375 ml  Net 3161 ml   Filed Weights   08/06/19 0500 08/07/19 0530 08/08/19 0500  Weight: 81.7 kg 85.5 kg 95 kg    Examination: General: ill appearing man on vent, sedated HENT: ett in place, minimal secretions Lungs: rhonchi bilaterally Cardiovascular: mechanical hs, open chest Abdomen: Soft, hypoactive bs Extremities: mild diffuse edema Neuro: remains heavily sedated but triggering vent GU: foley in place  Skin: no rashes   Plts 70>>64->47->58->88 Hgb 9.6>>7.9->8.8->7.8->12 Cr 1.3>>1.3->1.2 PTT 61->94->44->39 CXR stable airspace disease, personally reviewed by me coox 56.8  Resolved Hospital Problem list   N/A  Assessment & Plan:  S/P VA ECMO cannulation after Vtach arrest in setting of recent CABG. -decannulation with implantation of impella 6/28 - Heparin gtt thru device - Transfusion goals per TCTS - Trend usual indices - Amiodarone per CHF, rebolused yesterday afternoon. - Would continue broad spectrum abx for now given number of times has needed bedside exploration (3x to date)  Hemorrhagic/cardiogenic shock- -hemorrhagic appears to have improved but oozing quite a bit, cont to replace products.  -vasopressors/inotropes actively titrated.  - Management per TCTS,  transfusions as needed, avoid hypothermia, acidemia, coagulopathy as able -replacing calcium in setting of large volume transfusion.   On mechanical ventilation for  above - Lung protective tidal volumes -vent setting minimal 40/5 -consideration of trach placement at this time.   Ischemic cardiomyopathy - Management per CHF team  DM with hyperglycemia - Insulin endotool  Best practice:  Diet: ok to resume TF to resume today.  Pain/Anxiety/Delirium protocol (if indicated): fentanyl, propofol VAP protocol (if indicated): in place DVT prophylaxis:heparin gtt via device GI prophylaxis: PPI Glucose control: see above Mobility: BR Code Status: Full Family Communication: per primary Disposition:  ICU  Critical care time: The patient is critically ill with multiple organ systems failure and requires high complexity decision making for assessment and support, frequent evaluation and titration of therapies, application of advanced monitoring technologies and extensive interpretation of multiple databases.  Critical care time 38 mins. This represents my time independent of the NPs time taking care of the pt. This is excluding procedures.    Briant Sites DO [facility] 08/08/2019, 7:39 AM

## 2019-08-08 NOTE — Progress Notes (Signed)
Nutrition Follow-up  DOCUMENTATION CODES:   Not applicable  INTERVENTION:   Tube Feeding via OG tube: Resume Pivot 1.5 at 20 ml/hr Goal rate of 50 ml/hr  Pro-Stat 60 mL TID Provides 135 g of protein, 1320 kcals, 365 mL of free water  TF regimen and propofol at current rate providing 2057 total kcal/day   Add MVI with Minerals  NUTRITION DIAGNOSIS:   Increased nutrient needs related to post-op healing as evidenced by estimated needs.  Being addressed via TF   GOAL:   Patient will meet greater than or equal to 90% of their needs  Progressing  MONITOR:   PO intake, Supplement acceptance, Weight trends, Labs, I & O's  REASON FOR ASSESSMENT:   Consult  ([facility] tube feeding recommendations)  ASSESSMENT:   Patient with PMH significant for CHF, COPD, HTN, DM, HLD, MDD, and BPH. Presents this admission with CHF exacerbation.   6/24- s/p CABG x4 6/25- VT arrest, emergent sternotomy, VA ECMO cannulation  6/28- De-cannulated, Impella placed 6/29- Bedside Mediastinal Exploration 2/2 tamponade with multiple blood clots removed  Pt remains sedated on vent support, chest open with plan for possible closure on tomorrow or Friday Sedated with propofol, fentanyl, precedex. Requiring epinephrine, levophed, vasopressin, milrinone and amiodarone gtt  OG tube in place, Pivot 1.5 resume at 20 ml/hr this AM  Propofol: 27.9 ml/hr (737 kcals)  Current weight 95 kg; admit weight 79 kg. Net + 7.5 kg. Lowest weight of 73 kg since admission  Labs: reviewed Meds: lasix drip   Diet Order:   Diet Order            Diet NPO time specified  Diet effective now                 EDUCATION NEEDS:   Education needs have been addressed  Skin:  Skin Assessment: Skin Integrity Issues: Skin Integrity Issues:: Incisions, Other (Comment) Incisions: R leg, R groin Other: open chest  Last BM:  6/29  Height:   Ht Readings from Last 1 Encounters:  08/08/19 5\' 9"  (1.753 m)    Weight:    Wt Readings from Last 1 Encounters:  08/08/19 95 kg    BMI:  Body mass index is 30.93 kg/m.  Estimated Nutritional Needs:   Kcal:  1820-2184 kcal  Protein:  110-146  Fluid:  >/= 1.8 L/day   08/10/19 MS, RDN, LDN, CNSC Registered Dietitian III RD Pager Number and RD On-Call Pager Number Located in [place]

## 2019-08-08 NOTE — Op Note (Signed)
Procedure(s): MEDIASTINAL EXPLORATION POST OPERATIVE OPEN HEART IN 2H02 Procedure Note  Maurice Fotheringham male 64 y.o. 08/08/2019  Procedure(s) and Anesthesia Type:    * MEDIASTINAL EXPLORATION POST OPERATIVE OPEN HEART IN 2H02 - General  Surgeon(s) and Role:    [name], MD - Primary   Indications: The patient is s/p CABG with subsequent arrest requiring open chest massage and institution of VA ecmo. He was decannulated from ECMO yesterday and had an Impella 5.5 LVAD inserted. He has evidence for cardiogenic shock this morning and echo findings consistent with tamponade. Bedside exploration is recommended on an emergent basis.  Surgeon: [name]   Assistants: staff  Anesthesia: General endotracheal anesthesia  ASA Class: 5    Procedure Detail  MEDIASTINAL EXPLORATION POST OPERATIVE OPEN HEART IN 2H02 Emergency consent was utilized. The chest was cleansed and draped sterilely. A preoperative pause was performed. The chest was re-entered safely. A large pericardial thrombus was encountered anteriorly. When relieved, hemodynamics improved. Copious irrigation was undertaken. A chest wall bleeding vessel was seen at the superior aspect of the sternum. Chest tubes were returned to the bilateral pleural spaces and mediastinum. The chest was left open. The defect was covered with an Esmark dressing sewn to the skin. An occlusive dressing was applied on top of the Esmark. This concluded the procedure.   Estimated Blood Loss:  one liter         Drains: 4 Bard fluted tubes in the mediastinum and bilateral pleural spaces                 Specimens: none         Implants: none        Complications:  * No complications entered in OR log *         Disposition: ICU - intubated and critically ill.         Condition: guarded

## 2019-08-08 NOTE — Progress Notes (Signed)
Cards fellow paged regarding pt's low volume status (s/p adding lasix gtt today, high UOP), c/f impella suction/hemolysis if intravascular volume decreases any more. CVP ~8, PADs ~15, Impella LV placement signal 122/-6 mmHg. Pt continues to put out ~500cc/hr UOP.    Awaiting callback.

## 2019-08-08 NOTE — Progress Notes (Signed)
Pharmacy Antibiotic Note  Todd [name] is a 64 y.o. male admitted on 07/26/2019 s/p CABG c/b cardiac arrest requiring ECMO cannulation. Pt started on vancomycin and meropenem with open chest. Creatinine remains stable, chest closure tentatively planned for Friday. Will check vancomycin level prior to next dose.  Plan: Meropenem 1g IV q8h Vancomycin 1000mg  IV q12h Vancomycin trough tonight   Height: 5\' 9"  (175.3 cm) Weight: 95 kg (209 lb 7 oz) IBW/kg (Calculated) : 70.7  Temp (24hrs), Avg:98.3 F (36.8 C), Min:95.7 F (35.4 C), Max:99.3 F (37.4 C)  Recent Labs  Lab 08/03/19 0950 08/03/19 1308 08/03/19 1616 08/03/19 1616 08/03/19 1941 08/03/19 1941 08/04/19 0513 08/04/19 1700 08/05/19 0343 08/05/19 1604 08/06/19 0340 08/06/19 0340 08/06/19 2014 08/07/19 0347 08/07/19 0910 08/07/19 1442 08/08/19 0302  WBC 11.9*   < > 7.9   < > 7.9   < > 9.3   < > 7.8   < > 7.0   < > 10.0 10.2 11.2* 6.7 11.1*  CREATININE 1.38*   < > 1.38*  --   --    < > 1.16   < > 1.28*   < > 1.23  --  1.22 1.35*  --  1.12 1.22  LATICACIDVEN 9.1*  --  8.2*  --  4.8*  --  1.3  --  1.3  --   --   --   --   --   --   --   --    < > = values in this interval not displayed.    Estimated Creatinine Clearance: 68.6 mL/min (by C-G formula based on SCr of 1.22 mg/dL).    Allergies  Allergen Reactions  . Empagliflozin     Other reaction(s): diarrhea  . Other     Other reaction(s): Unknown  . Simvastatin     Other reaction(s): diarrhea  . Sitagliptin     Other reaction(s): diarrhea/abd pain    Antimicrobials this admission: Cefuroxime 6/23 > 6/25 Vancomycin 6/24 > 6/24; resume 6/26 Meropenem 6/26 >>    7/26, PharmD, BCPS Clinical Pharmacist [phone] Please check AMION for all [facility] numbers 08/08/2019

## 2019-08-08 NOTE — Progress Notes (Signed)
Pharmacy Antibiotic Note  Todd [name] is a 64 y.o. male admitted on 07/26/2019 s/p CABG c/b cardiac arrest requiring ECMO cannulation. Pt started on vancomycin and meropenem with open chest. Creatinine remains stable, chest closure tentatively planned for Friday.   Despite stable serum creatinine, Vancomycin trough came back elevated at 33 mcg/ml.  Will stop vancomycin for now and repeat level tomorrow, so that kinetics can be calculated.  Plan:  D/c scheduled Vancomycin Draw vancomycin trough tomorrow at 12 noon. Will redose vancomycin when level is approximately 15 mcg/ml.  Height: 5\' 9"  (175.3 cm) Weight: 95 kg (209 lb 7 oz) IBW/kg (Calculated) : 70.7  Temp (24hrs), Avg:98.6 F (37 C), Min:97.7 F (36.5 C), Max:99.3 F (37.4 C)  Recent Labs  Lab 08/03/19 0950 08/03/19 1308 08/03/19 1616 08/03/19 1616 08/03/19 1941 08/03/19 1941 08/04/19 0513 08/04/19 1700 08/05/19 0343 08/05/19 1604 08/06/19 2014 08/06/19 2014 08/07/19 0347 08/07/19 0910 08/07/19 1442 08/08/19 0302 08/08/19 1532 08/08/19 2054  WBC 11.9*   < > 7.9   < > 7.9   < > 9.3   < > 7.8   < > 10.0   < > 10.2 11.2* 6.7 11.1* 16.8*  --   CREATININE 1.38*   < > 1.38*  --   --    < > 1.16   < > 1.28*   < > 1.22  --  1.35*  --  1.12 1.22 1.18  --   LATICACIDVEN 9.1*  --  8.2*  --  4.8*  --  1.3  --  1.3  --   --   --   --   --   --   --   --   --   VANCOTROUGH  --   --   --   --   --   --   --   --   --   --   --   --   --   --   --   --   --  33*   < > = values in this interval not displayed.    Estimated Creatinine Clearance: 71 mL/min (by C-G formula based on SCr of 1.18 mg/dL).    Allergies  Allergen Reactions  . Empagliflozin     Other reaction(s): diarrhea  . Other     Other reaction(s): Unknown  . Simvastatin     Other reaction(s): diarrhea  . Sitagliptin     Other reaction(s): diarrhea/abd pain    Antimicrobials this admission: Cefuroxime 6/23 > 6/25 Vancomycin 6/24 > 6/24; resume  6/26 Meropenem 6/26 >>   7/26, PharmD, [facility] Clinical Pharmacist Please see AMION for all Pharmacists' Contact Phone Numbers 08/08/2019, 10:31 PM

## 2019-08-08 NOTE — Addendum Note (Signed)
Addendum  created 08/08/19 1009 by Cecile Hearing, MD   Intraprocedure Event edited

## 2019-08-08 NOTE — Plan of Care (Signed)
  Problem: Clinical Measurements: Goal: Diagnostic test results will improve Outcome: Progressing   Problem: Clinical Measurements: Goal: Respiratory complications will improve Outcome: Progressing   Problem: Nutrition: Goal: Adequate nutrition will be maintained Outcome: Progressing   Problem: Elimination: Goal: Will not experience complications related to bowel motility Outcome: Progressing   Problem: Cardiac: Goal: Ability to achieve and maintain adequate cardiopulmonary perfusion will improve Outcome: Progressing

## 2019-08-08 NOTE — Progress Notes (Signed)
Patient ID: Todd [name], male   DOB: [birth year], 64 y.o.   MRN: [id number]     Advanced Heart Failure Rounding Note  PCP-Cardiologist: No primary care provider on file.   Subjective:    08/02/19 CABG 08/03/19 VF arrest. Chest open at bedside 08/03/19 Back to OR for ECMO cannulation 08/03/19 Washout out for tamponade 08/04/19 Repeat bedside washout for tamponade 08/06/19 To OR for decannulation and Impella 5.5 08/07/19 Underwent emergent bedside chest exploration at bedside for tamponade   Remains on vent with Impella 5.5. NE 24 epi 20 milrinone 0.25   Bleeding has stabilized. Markedly volume overloaded. Weeping from multiple sites. Weight up 48 pounds.   According to RN was awake and apparently following commands this am.   Impella 5.5 P-8 Good pulsativity Flow 4.3L   Swan CVP 13 PA 44/28 Thermo 5.4/2.8  Objective:   Weight Range: 95 kg Body mass index is 30.93 kg/m.   Vital Signs:   Temp:  [95.7 F (35.4 C)-99.3 F (37.4 C)] 99.3 F (37.4 C) (06/30 0800) Pulse Rate:  [69-104] 93 (06/30 0800) Resp:  [0-24] 24 (06/30 0800) BP: (61-122)/(39-94) 122/67 (06/30 0800) SpO2:  [84 %-100 %] 97 % (06/30 0800) Arterial Line BP: (43-108)/(36-91) 96/62 (06/30 0800) FiO2 (%):  [40 %-100 %] 40 % (06/30 0800) Weight:  [95 kg] 95 kg (06/30 0500) Last BM Date: 08/07/19  Weight change: Filed Weights   08/06/19 0500 08/07/19 0530 08/08/19 0500  Weight: 81.7 kg 85.5 kg 95 kg    Intake/Output:   Intake/Output Summary (Last 24 hours) at 08/08/2019 0901 Last data filed at 08/08/2019 0800 Gross per 24 hour  Intake 7915.79 ml  Output 4725 ml  Net 3190.79 ml      Physical Exam    General:  Sedated on vent. anasarca HEENT: normal + ETT Neck: supple. RIJ swan + JVP Cor: R axillary impella chest open with spacer and derssing Lungs: coarse  +CTs Abdomen: soft, nontender, + distended.Hypoactive BS Extremities: no cyanosis, clubbing, rash, 4+ edema weeping Neuro:  intubated/sedated  Telemetry   Sinus 90-100 Personally reviewed  Labs    CBC Recent Labs    08/07/19 1442 08/07/19 1450 08/07/19 2307 08/08/19 0302  WBC 6.7  --   --  11.1*  HGB 12.4*   < > 11.6* 12.1*  HCT 36.6*   < > 34.0* 35.3*  MCV 86.7  --   --  85.7  PLT 71*  --   --  88*   < > = values in this interval not displayed.   Basic Metabolic Panel Recent Labs    08/07/19 0350 08/07/19 0504 08/07/19 1442 08/07/19 1450 08/07/19 2307 08/08/19 0302  NA  --    < > 141   < > 141 139  K  --    < > 3.4*   < > 3.3* 3.8  CL  --    < > 107  --   --  106  CO2  --    < > 25  --   --  22  GLUCOSE  --    < > 123*  --   --  114*  BUN  --    < > 23  --   --  22  CREATININE  --    < > 1.12  --   --  1.22  CALCIUM  --    < > 6.7*  --   --  7.0*  MG 1.4*  --   --   --   --   --    < > =  values in this interval not displayed.   Liver Function Tests Recent Labs    08/07/19 0347 08/08/19 0302  AST 33 44*  ALT 20 19  ALKPHOS 62 67  BILITOT 3.2* 4.2*  PROT 4.2* 4.0*  ALBUMIN 2.7* 2.3*   No results for input(s): LIPASE, AMYLASE in the last 72 hours. Cardiac Enzymes No results for input(s): CKTOTAL, CKMB, CKMBINDEX, TROPONINI in the last 72 hours.  BNP: BNP (last 3 results) Recent Labs    07/26/19 1236  BNP 2,568.2*    ProBNP (last 3 results) No results for input(s): PROBNP in the last 8760 hours.   D-Dimer No results for input(s): DDIMER in the last 72 hours. Hemoglobin A1C No results for input(s): HGBA1C in the last 72 hours. Fasting Lipid Panel Recent Labs    08/07/19 0350  TRIG 129   Thyroid Function Tests No results for input(s): TSH, T4TOTAL, T3FREE, THYROIDAB in the last 72 hours.  Invalid input(s): FREET3  Other results:   Imaging    DG CHEST PORT 1 VIEW  Result Date: 08/08/2019 CLINICAL DATA:  LVAD EXAM: PORTABLE CHEST 1 VIEW COMPARISON:  Yesterday FINDINGS: Endotracheal tube with tip between the clavicular heads and carina. Enteric tube  reaches the stomach. Impella from the right in unchanged position. Swan-Ganz catheter from the right IJ with tip at the right hilum. Chest tubes are present. Vascular congestion and hazy opacities at the right more the left base. No visible pneumothorax. Open sternum. IMPRESSION: Stable hardware positioning and pulmonary opacification. No visible pneumothorax. Electronically Signed   By: Monte Fantasia M.D.   On: 08/08/2019 08:24   DG Chest Port 1 View  Result Date: 08/07/2019 CLINICAL DATA:  Status post chest wound exploration EXAM: PORTABLE CHEST 1 VIEW COMPARISON:  08/07/2019 FINDINGS: Endotracheal tube 4.5 cm above the carina. Nasogastric tube projecting over the stomach. Impella device in satisfactory unchanged position. Bilateral chest tubes. Swan-Ganz catheter tip projecting over the right ventricular outflow tract. Bilateral chest tubes in unchanged position. No pneumothorax. Bilateral diffuse interstitial thickening. Trace right pleural effusion. Stable cardiomegaly. Prior CABG. IMPRESSION: 1. Support lines and tubes in satisfactory position. 2. Mild pulmonary edema. Trace right pleural effusion. Electronically Signed   By: Kathreen Devoid   On: 08/07/2019 13:08     Medications:     Scheduled Medications: . sodium chloride   Intravenous Once  . artificial tears   Both Eyes Q8H  . aspirin EC  325 mg Oral Daily   Or  . aspirin  324 mg Per Tube Daily  . bisacodyl  10 mg Oral Daily   Or  . bisacodyl  10 mg Rectal Daily  . chlorhexidine gluconate (MEDLINE KIT)  15 mL Mouth Rinse BID  . Chlorhexidine Gluconate Cloth  6 each Topical Daily  . citalopram  20 mg Per Tube Daily  . docusate  200 mg Per Tube Daily  . feeding supplement (PIVOT 1.5 CAL)  1,000 mL Per Tube Q24H  . feeding supplement (PRO-STAT SUGAR FREE 64)  30 mL Per Tube BID  . fentaNYL (SUBLIMAZE) injection  50 mcg Intravenous Once  . insulin aspart  0-24 Units Subcutaneous Q4H  . mouth rinse  15 mL Mouth Rinse 10 times per day   . pantoprazole (PROTONIX) IV  40 mg Intravenous QHS  . rosuvastatin  20 mg Per Tube Daily  . sodium chloride flush  10-40 mL Intracatheter Q12H  . sodium chloride flush  3 mL Intravenous Q12H    Infusions: . sodium chloride 10  mL/hr at 08/07/19 0300  . sodium chloride 1 mL/hr at 08/07/19 0300  . sodium chloride 1 mL/hr at 08/07/19 0300  . sodium chloride 10 mL/hr at 08/07/19 1100  . amiodarone 30 mg/hr (08/08/19 0800)  . dexmedetomidine (PRECEDEX) IV infusion 0.7 mcg/kg/hr (08/08/19 0800)  . dextrose 5 % Impella 5.0 Purge solution    . electrolyte-148 100 mL/hr at 08/08/19 0800  . epinephrine 10 mcg/min (08/08/19 0800)  . fentaNYL infusion INTRAVENOUS 200 mcg/hr (08/08/19 0800)  . lactated ringers 500 mL/hr at 08/07/19 0300  . lactated ringers 20 mL/hr at 08/08/19 0800  . lactated ringers 20 mL/hr at 08/07/19 1100  . meropenem (MERREM) IV Stopped (08/08/19 3154)  . milrinone 0.375 mcg/kg/min (08/08/19 0800)  . norepinephrine (LEVOPHED) Adult infusion 24 mcg/min (08/08/19 0800)  . propofol (DIPRIVAN) infusion 80 mcg/kg/min (08/08/19 0800)  . vancomycin Stopped (08/07/19 2153)  . vasopressin (PITRESSIN) infusion - *FOR SHOCK* 0.03 Units/min (08/08/19 0800)    PRN Medications: sodium chloride, sodium chloride, dextrose, fentaNYL, lactated ringers, metoprolol tartrate, midazolam, morphine injection, ondansetron (ZOFRAN) IV, sodium chloride flush, sodium chloride flush   Assessment/Plan   1. Cardiogenic shock: Echo with EF 20-25%, moderate RV dysfunction.  Now on [place] ECMO post-VF arrest, cannulated 6/25.  Stable s/p return to OR for mediastinal re-exploration due to bleeding on 6/25.   - Decannulated from [place] ECMO on 08/06/19. Impella 5.5 placed - Underwent emergent chest re-exploration on 6/29 for tamponade and clot removal - Hemodynamics much improved but remains on triple pressors - Impella waveforms much improved  - He is massively volume overloaded. Give lasix 80 IV and then  lasix gtt at 8/hr - Likely not ready for chest closure until Monday given degree of volume overload and ooziness 2. CAD: 3VD - s/p CABG x 4 with LIMA-LAD, SVG-D1, SVG-PDA, and radial-OM1 on 6/24.   - Continue ASA and Crestor per tube.  3. Cardiac arrest: VF arrest with prolonged code - Rhythm now stable. Continue IV amiodarone at 30 mg/hr.   - Eventual evaluation for ICD.   - Keep K > 4.0 Mg > 2.0 4. Acute hypoxemic respiratory failure: In setting of cardiac arrest.  H/o COPD.   - On vent CCM following. - Discussed at bedside 5. Acute blood loss anemia: Now s/p mediastinal re-exploration x 3 - Hgb stable today. Continue to follow. CT drainage is slowing 6. Thrombocytopenia:  - Improving 7. ID: Chest open, empirically covering with meropenem and vancomycin.    CRITICAL CARE Performed by: Glori Bickers  Total critical care time: 40 minutes at bedside  Critical care time was exclusive of separately billable procedures and treating other patients.  Critical care was necessary to treat or prevent imminent or life-threatening deterioration.  Critical care was time spent personally by me on the following activities: development of treatment plan with patient and/or surrogate as well as nursing, discussions with consultants, evaluation of patient's response to treatment, examination of patient, obtaining history from patient or surrogate, ordering and performing treatments and interventions, ordering and review of laboratory studies, ordering and review of radiographic studies, pulse oximetry and re-evaluation of patient's condition.   Length of Stay: 13  Glori Bickers, MD  08/08/2019, 9:01 AM  Advanced Heart Failure Team Pager [phone] (M-F; 7a - 4p)  Please contact [facility] for night-coverage after hours (4p -7a ) and weekends on [email or web address]

## 2019-08-08 NOTE — Progress Notes (Signed)
1 Day Post-Op Procedure(s) (LRB): MEDIASTINAL EXPLORATION POST OPERATIVE OPEN HEART IN 2H02 (N/A) Subjective: sedated  Objective: Vital signs in last 24 hours: Temp:  [95.7 F (35.4 C)-99.3 F (37.4 C)] 99.3 F (37.4 C) (06/30 0900) Pulse Rate:  [69-104] 88 (06/30 0900) Cardiac Rhythm: Atrial paced (06/30 0800) Resp:  [0-24] 17 (06/30 0900) BP: (61-135)/(39-94) 135/74 (06/30 0900) SpO2:  [84 %-100 %] 93 % (06/30 0900) Arterial Line BP: (43-108)/(36-91) 99/60 (06/30 0900) FiO2 (%):  [40 %-100 %] 40 % (06/30 0800) Weight:  [95 kg] 95 kg (06/30 0500)  Hemodynamic parameters for last 24 hours: PAP: (27-47)/(16-26) 42/26 CVP:  [6 mmHg-18 mmHg] 12 mmHg CO:  [3.6 L/min-6.3 L/min] 6.3 L/min CI:  [1.9 L/min/m2-3.3 L/min/m2] 3.3 L/min/m2  Intake/Output from previous day: 06/29 0701 - 06/30 0700 In: 8748 [I.V.:4592.8; Blood:2883.8; NG/GT:40; IV Piggyback:1110.8] Out: 5375 [Urine:850; Emesis/NG output:1195; Stool:20; Chest Tube:3310] Intake/Output this shift: Total I/O In: 284.7 [I.V.:267.6; Other:4.9; IV Piggyback:12.2] Out: 200 [Urine:100; Chest Tube:100]  General appearance: intubated/sedated Neurologic: unable to assess Heart: regular rate and rhythm, S1, S2 normal, no murmur, click, rub or gallop Lungs: clear to auscultation bilaterally Abdomen: normal findings: soft, non-tender Extremities: extremities normal, atraumatic, no cyanosis or edema Wound: dressed, intact  Lab Results: Recent Labs    08/07/19 1442 08/07/19 1450 08/07/19 2307 08/08/19 0302  WBC 6.7  --   --  11.1*  HGB 12.4*   < > 11.6* 12.1*  HCT 36.6*   < > 34.0* 35.3*  PLT 71*  --   --  88*   < > = values in this interval not displayed.   BMET:  Recent Labs    08/07/19 1442 08/07/19 1450 08/07/19 2307 08/08/19 0302  NA 141   < > 141 139  K 3.4*   < > 3.3* 3.8  CL 107  --   --  106  CO2 25  --   --  22  GLUCOSE 123*  --   --  114*  BUN 23  --   --  22  CREATININE 1.12  --   --  1.22  CALCIUM  6.7*  --   --  7.0*   < > = values in this interval not displayed.    PT/INR:  Recent Labs    08/07/19 1442  LABPROT 15.0  INR 1.2   ABG    Component Value Date/Time   PHART 7.435 08/07/2019 2307   HCO3 25.9 08/07/2019 2307   TCO2 27 08/07/2019 2307   ACIDBASEDEF 2.0 08/07/2019 0955   O2SAT 96.0 08/07/2019 2307   CBG (last 3)  Recent Labs    08/07/19 2049 08/07/19 2311 08/08/19 0314  GLUCAP 102* 135* 114*    Assessment/Plan: S/P Procedure(s) (LRB): MEDIASTINAL EXPLORATION POST OPERATIVE OPEN HEART IN 2H02 (N/A) Diuresis consider chest closure tomorrow afternoon or Friday   LOS: 13 days    [name] 08/08/2019

## 2019-08-09 ENCOUNTER — Inpatient Hospital Stay (HOSPITAL_COMMUNITY): Payer: Medicare Other

## 2019-08-09 ENCOUNTER — Encounter (HOSPITAL_COMMUNITY)
Admission: RE | Disposition: A | Discharge status: Nursing Home (Includes ICF) | Payer: Self-pay | Source: Home / Self Care | Attending: Thoracic Surgery (Cardiothoracic Vascular Surgery)

## 2019-08-09 ENCOUNTER — Encounter (HOSPITAL_COMMUNITY): Payer: Self-pay | Admitting: Certified Registered"

## 2019-08-09 DIAGNOSIS — Z95811 Presence of heart assist device: Secondary | ICD-10-CM

## 2019-08-09 LAB — POCT I-STAT 7, (LYTES, BLD GAS, ICA,H+H)
Acid-Base Excess: 6 mmol/L — ABNORMAL HIGH (ref 0.0–2.0)
Acid-Base Excess: 6 mmol/L — ABNORMAL HIGH (ref 0.0–2.0)
Acid-Base Excess: 5 mmol/L — ABNORMAL HIGH (ref 0.0–2.0)
Bicarbonate: 27.4 mmol/L (ref 20.0–28.0)
Bicarbonate: 29.5 mmol/L — ABNORMAL HIGH (ref 20.0–28.0)
Bicarbonate: 30.3 mmol/L — ABNORMAL HIGH (ref 20.0–28.0)
Calcium, Ion: 0.98 mmol/L — ABNORMAL LOW (ref 1.15–1.40)
Calcium, Ion: 0.96 mmol/L — ABNORMAL LOW (ref 1.15–1.40)
Calcium, Ion: 1.05 mmol/L — ABNORMAL LOW (ref 1.15–1.40)
HCT: 33 % — ABNORMAL LOW (ref 39.0–52.0)
HCT: 35 % — ABNORMAL LOW (ref 39.0–52.0)
HCT: 35 % — ABNORMAL LOW (ref 39.0–52.0)
Hemoglobin: 11.2 g/dL — ABNORMAL LOW (ref 13.0–17.0)
Hemoglobin: 11.9 g/dL — ABNORMAL LOW (ref 13.0–17.0)
Hemoglobin: 11.9 g/dL — ABNORMAL LOW (ref 13.0–17.0)
O2 Saturation: 98 %
O2 Saturation: 98 %
O2 Saturation: 98 %
Patient temperature: 37.9
Patient temperature: 37.8
Patient temperature: 37.5
Potassium: 2.9 mmol/L — ABNORMAL LOW (ref 3.5–5.1)
Potassium: 3.5 mmol/L (ref 3.5–5.1)
Potassium: 3.2 mmol/L — ABNORMAL LOW (ref 3.5–5.1)
Sodium: 134 mmol/L — ABNORMAL LOW (ref 135–145)
Sodium: 135 mmol/L (ref 135–145)
Sodium: 135 mmol/L (ref 135–145)
TCO2: 28 mmol/L (ref 22–32)
TCO2: 31 mmol/L (ref 22–32)
TCO2: 32 mmol/L (ref 22–32)
pCO2 arterial: 30.9 mmHg — ABNORMAL LOW (ref 32.0–48.0)
pCO2 arterial: 37.7 mmHg (ref 32.0–48.0)
pCO2 arterial: 47.7 mmHg (ref 32.0–48.0)
pH, Arterial: 7.558 — ABNORMAL HIGH (ref 7.350–7.450)
pH, Arterial: 7.505 — ABNORMAL HIGH (ref 7.350–7.450)
pH, Arterial: 7.413 (ref 7.350–7.450)
pO2, Arterial: 85 mmHg (ref 83.0–108.0)
pO2, Arterial: 95 mmHg (ref 83.0–108.0)
pO2, Arterial: 115 mmHg — ABNORMAL HIGH (ref 83.0–108.0)

## 2019-08-09 LAB — CBC
HCT: 31 % — ABNORMAL LOW (ref 39.0–52.0)
Hemoglobin: 10.9 g/dL — ABNORMAL LOW (ref 13.0–17.0)
MCH: 30.4 pg (ref 26.0–34.0)
MCHC: 35.2 g/dL (ref 30.0–36.0)
MCV: 86.6 fL (ref 80.0–100.0)
Platelets: 97 10*3/uL — ABNORMAL LOW (ref 150–400)
RBC: 3.58 MIL/uL — ABNORMAL LOW (ref 4.22–5.81)
RDW: 14.9 % (ref 11.5–15.5)
WBC: 15 10*3/uL — ABNORMAL HIGH (ref 4.0–10.5)
nRBC: 0.6 % — ABNORMAL HIGH (ref 0.0–0.2)

## 2019-08-09 LAB — TYPE AND SCREEN
ABO/RH(D): A POS
Antibody Screen: NEGATIVE

## 2019-08-09 LAB — COMPREHENSIVE METABOLIC PANEL
ALT: 17 U/L (ref 0–44)
AST: 46 U/L — ABNORMAL HIGH (ref 15–41)
Albumin: 1.9 g/dL — ABNORMAL LOW (ref 3.5–5.0)
Alkaline Phosphatase: 79 U/L (ref 38–126)
Anion gap: 15 (ref 5–15)
BUN: 16 mg/dL (ref 8–23)
CO2: 23 mmol/L (ref 22–32)
Calcium: 6.3 mg/dL — CL (ref 8.9–10.3)
Chloride: 96 mmol/L — ABNORMAL LOW (ref 98–111)
Creatinine, Ser: 1.17 mg/dL (ref 0.61–1.24)
GFR calc Af Amer: >60 mL/min (ref >60)
GFR calc non Af Amer: >60 mL/min (ref >60)
Glucose, Bld: 98 mg/dL (ref 70–99)
Potassium: 3.3 mmol/L — ABNORMAL LOW (ref 3.5–5.1)
Sodium: 134 mmol/L — ABNORMAL LOW (ref 135–145)
Total Bilirubin: 4.8 mg/dL — ABNORMAL HIGH (ref 0.3–1.2)
Total Protein: 3.6 g/dL — ABNORMAL LOW (ref 6.5–8.1)

## 2019-08-09 LAB — BASIC METABOLIC PANEL
Anion gap: 13 (ref 5–15)
Anion gap: 16 — ABNORMAL HIGH (ref 5–15)
BUN: 17 mg/dL (ref 8–23)
BUN: 18 mg/dL (ref 8–23)
CO2: 24 mmol/L (ref 22–32)
CO2: 22 mmol/L (ref 22–32)
Calcium: 7.6 mg/dL — ABNORMAL LOW (ref 8.9–10.3)
Calcium: 7.6 mg/dL — ABNORMAL LOW (ref 8.9–10.3)
Chloride: 97 mmol/L — ABNORMAL LOW (ref 98–111)
Chloride: 97 mmol/L — ABNORMAL LOW (ref 98–111)
Creatinine, Ser: 1.25 mg/dL — ABNORMAL HIGH (ref 0.61–1.24)
Creatinine, Ser: 1.34 mg/dL — ABNORMAL HIGH (ref 0.61–1.24)
GFR calc Af Amer: >60 mL/min (ref >60)
GFR calc Af Amer: >60 mL/min (ref >60)
GFR calc non Af Amer: >60 mL/min (ref >60)
GFR calc non Af Amer: 55 mL/min — ABNORMAL LOW (ref >60)
Glucose, Bld: 126 mg/dL — ABNORMAL HIGH (ref 70–99)
Glucose, Bld: 158 mg/dL — ABNORMAL HIGH (ref 70–99)
Potassium: 3.6 mmol/L (ref 3.5–5.1)
Potassium: 4 mmol/L (ref 3.5–5.1)
Sodium: 134 mmol/L — ABNORMAL LOW (ref 135–145)
Sodium: 135 mmol/L (ref 135–145)

## 2019-08-09 LAB — POCT I-STAT, CHEM 8
BUN: 17 mg/dL (ref 8–23)
Calcium, Ion: 0.96 mmol/L — ABNORMAL LOW (ref 1.15–1.40)
Chloride: 95 mmol/L — ABNORMAL LOW (ref 98–111)
Creatinine, Ser: 1.4 mg/dL — ABNORMAL HIGH (ref 0.61–1.24)
Glucose, Bld: 101 mg/dL — ABNORMAL HIGH (ref 70–99)
HCT: 30 % — ABNORMAL LOW (ref 39.0–52.0)
Hemoglobin: 10.2 g/dL — ABNORMAL LOW (ref 13.0–17.0)
Potassium: 2.8 mmol/L — ABNORMAL LOW (ref 3.5–5.1)
Sodium: 136 mmol/L (ref 135–145)
TCO2: 25 mmol/L (ref 22–32)

## 2019-08-09 LAB — GLUCOSE, CAPILLARY
Glucose-Capillary: 118 mg/dL — ABNORMAL HIGH (ref 70–99)
Glucose-Capillary: 115 mg/dL — ABNORMAL HIGH (ref 70–99)
Glucose-Capillary: 149 mg/dL — ABNORMAL HIGH (ref 70–99)
Glucose-Capillary: 157 mg/dL — ABNORMAL HIGH (ref 70–99)
Glucose-Capillary: 135 mg/dL — ABNORMAL HIGH (ref 70–99)

## 2019-08-09 LAB — ECHOCARDIOGRAM LIMITED
Height: 69 in
Weight: 3350.99 oz

## 2019-08-09 LAB — TRIGLYCERIDES: Triglycerides: 550 mg/dL — ABNORMAL HIGH (ref <150)

## 2019-08-09 LAB — HEPARIN LEVEL (UNFRACTIONATED): Heparin Unfractionated: <0.1 IU/mL — ABNORMAL LOW (ref 0.30–0.70)

## 2019-08-09 LAB — VANCOMYCIN, RANDOM: Vancomycin Rm: 22

## 2019-08-09 LAB — MAGNESIUM: Magnesium: 1.8 mg/dL (ref 1.7–2.4)

## 2019-08-09 LAB — APTT: aPTT: 45 seconds — ABNORMAL HIGH (ref 24–36)

## 2019-08-09 SURGERY — CLOSURE, STERNUM
Anesthesia: General

## 2019-08-09 MED ORDER — POTASSIUM CHLORIDE 10 MEQ/50ML IV SOLN
10.0000 meq | INTRAVENOUS | Status: AC
  Administered 2019-08-09 (×4): 10 meq via INTRAVENOUS
  Filled 2019-08-09 (×4): qty 50

## 2019-08-09 MED ORDER — MAGNESIUM SULFATE 2 GM/50ML IV SOLN
2.0000 g | Freq: Once | INTRAVENOUS | Status: AC
  Administered 2019-08-09: 2 g via INTRAVENOUS
  Filled 2019-08-09: qty 50

## 2019-08-09 MED ORDER — POTASSIUM CHLORIDE 20 MEQ/15ML (10%) PO SOLN
40.0000 meq | Freq: Once | ORAL | Status: AC
  Administered 2019-08-09: 40 meq
  Filled 2019-08-09: qty 30

## 2019-08-09 MED ORDER — MILRINONE LACTATE IN DEXTROSE 20-5 MG/100ML-% IV SOLN: 0.2500 ug/kg/min | INTRAVENOUS | Status: DC

## 2019-08-09 MED ORDER — MIDAZOLAM HCL 2 MG/2ML IJ SOLN
INTRAMUSCULAR | Status: AC
  Filled 2019-08-09: qty 2

## 2019-08-09 MED ORDER — POTASSIUM CHLORIDE 10 MEQ/50ML IV SOLN
10.0000 meq | INTRAVENOUS | Status: AC
  Administered 2019-08-09 (×8): 10 meq via INTRAVENOUS
  Filled 2019-08-09 (×7): qty 50

## 2019-08-09 MED ORDER — CALCIUM GLUCONATE-NACL 2-0.675 GM/100ML-% IV SOLN
2.0000 g | Freq: Once | INTRAVENOUS | Status: AC
  Administered 2019-08-09: 2000 mg via INTRAVENOUS
  Filled 2019-08-09: qty 100

## 2019-08-09 MED ORDER — VANCOMYCIN HCL 1750 MG/350ML IV SOLN
1750.0000 mg | INTRAVENOUS | Status: AC
  Administered 2019-08-10 – 2019-08-11 (×2): 1750 mg via INTRAVENOUS
  Filled 2019-08-09 (×3): qty 350

## 2019-08-09 MED ORDER — PROPOFOL 10 MG/ML IV BOLUS
INTRAVENOUS | Status: AC
  Filled 2019-08-09: qty 20

## 2019-08-09 MED ORDER — FENTANYL CITRATE (PF) 250 MCG/5ML IJ SOLN
INTRAMUSCULAR | Status: AC
  Filled 2019-08-09: qty 5

## 2019-08-09 MED ORDER — MILRINONE LACTATE IN DEXTROSE 20-5 MG/100ML-% IV SOLN
0.1250 ug/kg/min | INTRAVENOUS | Status: DC
  Administered 2019-08-09 – 2019-08-10 (×3): 0.125 ug/kg/min via INTRAVENOUS
  Filled 2019-08-09 (×2): qty 100

## 2019-08-09 NOTE — Progress Notes (Signed)
Patient had small amount of emesis. Tube feedings paused. Dr Isaiah Serge made aware. Will reassess readiness to feed in AM.

## 2019-08-09 NOTE — Progress Notes (Signed)
Patient ID: Todd [name], male   DOB: 05/08/1955, 63 y.o.   MRN: [id number]     Advanced Heart Failure Rounding Note  PCP-Cardiologist: No primary care provider on file.   Subjective:    08/02/19 CABG 08/03/19 VF arrest. Chest open at bedside 08/03/19 Back to OR for ECMO cannulation 08/03/19 Washout out for tamponade 08/04/19 Repeat bedside washout for tamponade 08/06/19 To OR for decannulation and Impella 5.5 08/07/19 Underwent emergent bedside chest exploration at bedside for tamponade   Remains on vent with Impella 5.5. NE 29 epi 10 milrinone 0.25 VP 0.03.  Sedated on propofol. On IV lasix. Diuresing well. Out 11.5L yesterday.    Impella 5.5 P-8 Good pulsativity Flow 4.4L   Swan CVP 6 PA 32/24 (27) Thermo 6.0/3.2 SVR ~800 PAPi 1.3    Objective:   Weight Range: 95 kg Body mass index is 30.93 kg/m.   Vital Signs:   Temp:  [97.7 F (36.5 C)-100.2 F (37.9 C)] 100 F (37.8 C) (07/01 0800) Pulse Rate:  [51-104] 103 (07/01 0800) Resp:  [17-25] 20 (07/01 0800) BP: (91-135)/(59-74) 97/72 (07/01 0800) SpO2:  [89 %-99 %] 97 % (07/01 0800) Arterial Line BP: (71-301)/(54-201) 81/61 (07/01 0800) FiO2 (%):  [40 %] 40 % (07/01 0800) Last BM Date: 08/08/19  Weight change: Filed Weights   08/06/19 0500 08/07/19 0530 08/08/19 0500  Weight: 81.7 kg 85.5 kg 95 kg    Intake/Output:   Intake/Output Summary (Last 24 hours) at 08/09/2019 0848 Last data filed at 08/09/2019 0828 Gross per 24 hour  Intake 9664.63 ml  Output 11800 ml  Net -2135.37 ml      Physical Exam    General:  Sedated on vent. anasarca HEENT: normal +ETT Neck: supple. RIJ swan. Cor: Chest open with space and dressing Regular rate & rhythm. +CTs Lungs: coarse Abdomen: soft, nontender, + distended. No hepatosplenomegaly. No bruits or masses. Good bowel sounds. Extremities: no cyanosis, clubbing, rash, 4+ edema weeping. Extremities wrapped  Neuro: sedated on vent    Telemetry   Sinus 90-110  Personally reviewed  Labs    CBC Recent Labs    08/08/19 1532 08/09/19 0013 08/09/19 0444 08/09/19 0444 08/09/19 0514 08/09/19 0755  WBC 16.8*  --  15.0*  --   --   --   HGB 12.2*   < > 10.9*   < > 11.2* 11.9*  HCT 34.8*   < > 31.0*   < > 33.0* 35.0*  MCV 85.5  --  86.6  --   --   --   PLT 119*  --  97*  --   --   --    < > = values in this interval not displayed.   Basic Metabolic Panel Recent Labs    08/07/19 0350 08/07/19 0504 08/08/19 1532 08/08/19 1532 08/09/19 0013 08/09/19 0013 08/09/19 0444 08/09/19 0444 08/09/19 0514 08/09/19 0755  NA  --    < > 136   < > 136   < > 134*   < > 134* 135  K  --    < > 3.9   < > 2.8*   < > 3.3*   < > 2.9* 3.5  CL  --    < > 103   < > 95*  --  96*  --   --   --   CO2  --    < > 22  --   --   --  23  --   --   --     GLUCOSE  --    < > 153*   < > 101*  --  98  --   --   --   BUN  --    < > 21   < > 17  --  16  --   --   --   CREATININE  --    < > 1.18   < > 1.40*  --  1.17  --   --   --   CALCIUM  --    < > 7.3*  --   --   --  6.3*  --   --   --   MG 1.4*  --   --   --   --   --  1.8  --   --   --    < > = values in this interval not displayed.   Liver Function Tests Recent Labs    08/08/19 1532 08/09/19 0444  AST 49* 46*  ALT 16 17  ALKPHOS 91 79  BILITOT 4.3* 4.8*  PROT 4.0* 3.6*  ALBUMIN 2.1* 1.9*   No results for input(s): LIPASE, AMYLASE in the last 72 hours. Cardiac Enzymes No results for input(s): CKTOTAL, CKMB, CKMBINDEX, TROPONINI in the last 72 hours.  BNP: BNP (last 3 results) Recent Labs    07/26/19 1236  BNP 2,568.2*    ProBNP (last 3 results) No results for input(s): PROBNP in the last 8760 hours.   D-Dimer No results for input(s): DDIMER in the last 72 hours. Hemoglobin A1C No results for input(s): HGBA1C in the last 72 hours. Fasting Lipid Panel Recent Labs    08/09/19 0444  TRIG 550*   Thyroid Function Tests No results for input(s): TSH, T4TOTAL, T3FREE, THYROIDAB in the last 72  hours.  Invalid input(s): FREET3  Other results:   Imaging    No results found.   Medications:     Scheduled Medications: . sodium chloride   Intravenous Once  . artificial tears   Both Eyes Q8H  . aspirin EC  325 mg Oral Daily   Or  . aspirin  324 mg Per Tube Daily  . bisacodyl  10 mg Oral Daily   Or  . bisacodyl  10 mg Rectal Daily  . chlorhexidine gluconate (MEDLINE KIT)  15 mL Mouth Rinse BID  . Chlorhexidine Gluconate Cloth  6 each Topical Daily  . citalopram  20 mg Per Tube Daily  . docusate  200 mg Per Tube Daily  . feeding supplement (PIVOT 1.5 CAL)  1,000 mL Per Tube Q24H  . feeding supplement (PRO-STAT SUGAR FREE 64)  60 mL Per Tube TID  . fentaNYL (SUBLIMAZE) injection  50 mcg Intravenous Once  . insulin aspart  0-24 Units Subcutaneous Q4H  . mouth rinse  15 mL Mouth Rinse 10 times per day  . multivitamin with minerals  1 tablet Per Tube Daily  . pantoprazole (PROTONIX) IV  40 mg Intravenous QHS  . potassium chloride  20 mEq Oral BID  . rosuvastatin  20 mg Per Tube Daily  . sodium chloride flush  10-40 mL Intracatheter Q12H  . sodium chloride flush  3 mL Intravenous Q12H  . vancomycin variable dose per unstable renal function (pharmacist dosing)   Does not apply See admin instructions    Infusions: . sodium chloride 10 mL/hr at 08/08/19 1200  . sodium chloride Stopped (08/08/19 1200)  . sodium chloride 10 mL/hr at 08/09/19 0801  . sodium chloride 10 mL/hr at  08/07/19 1100  . amiodarone 30 mg/hr (08/09/19 0800)  . calcium gluconate 2,000 mg (08/09/19 0827)  . electrolyte-148 100 mL/hr at 08/09/19 0800  . epinephrine 10 mcg/min (08/09/19 0800)  . fentaNYL infusion INTRAVENOUS 250 mcg/hr (08/09/19 0800)  . furosemide (LASIX) infusion 8 mg/hr (08/09/19 0800)  . impella catheter heparin 50 unit/mL in dextrose 5%    . lactated ringers Stopped (08/08/19 1200)  . lactated ringers 20 mL/hr at 08/09/19 0800  . lactated ringers 20 mL/hr at 08/07/19 1100  .  meropenem (MERREM) IV Stopped (08/09/19 0625)  . milrinone    . norepinephrine (LEVOPHED) Adult infusion 29 mcg/min (08/09/19 0800)  . potassium chloride 50 mL/hr at 08/09/19 0800  . propofol (DIPRIVAN) infusion 60 mcg/kg/min (08/09/19 0800)  . vasopressin (PITRESSIN) infusion - *FOR SHOCK* 0.03 Units/min (08/09/19 0800)    PRN Medications: sodium chloride, sodium chloride, dextrose, fentaNYL, lactated ringers, metoprolol tartrate, midazolam, morphine injection, ondansetron (ZOFRAN) IV, sodium chloride flush, sodium chloride flush   Assessment/Plan   1. Cardiogenic shock: Echo with EF 20-25%, moderate RV dysfunction.  Now on [place] ECMO post-VF arrest, cannulated 6/25.  Stable s/p return to OR for mediastinal re-exploration due to bleeding on 6/25.   - Decannulated from [place] ECMO on 08/06/19. Impella 5.5 placed - Underwent emergent chest re-exploration on 6/29 for tamponade and clot removal - Hemodynamics much improved but remains on high dose pressors.  - Impella waveforms much improved  - Remains massively volume overloaded but out 11L overnight on lasix gtt. Weight up 49 pounds as of yesterday. No weight today on chart - Likely not ready for chest closure tomorrow unless we can get a lot more fluid off. Hopefully soon. Will d/w Dr. Atkins. Continue aggressive diuresis and electrolyte supplementation.  - Bleeding from CTs much improved. - I decreased milrinone to 0.125. Increased VP. Impella turned up to P-9.  2. CAD: 3VD - s/p CABG x 4 with LIMA-LAD, SVG-D1, SVG-PDA, and radial-OM1 on 6/24.   - Continue ASA and Crestor per tube.  3. Cardiac arrest: VF arrest with prolonged code - Rhythm stable. Continue IV amiodarone at 30 mg/hr.   - Eventual evaluation for ICD.   - Keep K > 4.0 Mg > 2.0 4. Acute hypoxemic respiratory failure: In setting of cardiac arrest.  H/o COPD.   - On vent CCM following. - Discussed at bedside 5. Acute blood loss anemia: Now s/p mediastinal re-exploration x 3 -  Hgb stable today at 11.0 Continue to follow. CT drainage is slowing 6. Thrombocytopenia:  - Improving 7. ID: Chest open, empirically covering with meropenem and vancomycin.  - no chamge   CRITICAL CARE Performed by:    Total critical care time: 45 minutes at bedside  Critical care time was exclusive of separately billable procedures and treating other patients.  Critical care was necessary to treat or prevent imminent or life-threatening deterioration.  Critical care was time spent personally by me on the following activities: development of treatment plan with patient and/or surrogate as well as nursing, discussions with consultants, evaluation of patient's response to treatment, examination of patient, obtaining history from patient or surrogate, ordering and performing treatments and interventions, ordering and review of laboratory studies, ordering and review of radiographic studies, pulse oximetry and re-evaluation of patient's condition.   Length of Stay: 14   , MD  08/09/2019, 8:48 AM  Advanced Heart Failure Team Pager [phone] (M-F; 7a - 4p)  Please contact [facility] for night-coverage after hours (4p -7a ) and weekends   on [email or web address]

## 2019-08-09 NOTE — Progress Notes (Signed)
ANTICOAGULATION CONSULT NOTE - Follow Up Consult  Pharmacy Consult for heparin Indication: ECMO > impella  Labs: Recent Labs    08/07/19 0347 08/07/19 0504 08/07/19 1442 08/07/19 1450 08/08/19 0302 08/08/19 1115 08/08/19 1532 08/08/19 1532 08/09/19 0013 08/09/19 0013 08/09/19 0444 08/09/19 0444 08/09/19 0514 08/09/19 0514 08/09/19 0755 08/09/19 1018 08/09/19 1039  HGB 9.1*   < > 12.4*   < > 12.1*   < > 12.2*   < > 10.2*   < > 10.9*   < > 11.2*   < > 11.9* 11.9*  --   HCT 27.5*   < > 36.6*   < > 35.3*   < > 34.8*   < > 30.0*   < > 31.0*   < > 33.0*  --  35.0* 35.0*  --   PLT 58*   < > 71*   < > 88*  --  119*  --   --   --  97*  --   --   --   --   --   --   APTT 44*  --   --   --  39*  --   --   --   --   --  45*  --   --   --   --   --   --   LABPROT 16.0*  --  15.0  --   --   --   --   --   --   --   --   --   --   --   --   --   --   INR 1.3*  --  1.2  --   --   --   --   --   --   --   --   --   --   --   --   --   --   HEPARINUNFRC  --    < > <0.10*  --  <0.10*  --   --   --   --   --  <0.10*  --   --   --   --   --   --   CREATININE 1.35*   < > 1.12   < > 1.22  --  1.18   < > 1.40*  --  1.17  --   --   --   --   --  1.25*   < > = values in this interval not displayed.    Assessment: 64yo male s/p ECMO, now on Impella with heparin only in purge solution (50 units/ml = 230 units/hr).  CT drainage stable.  No bleeding or complications noted.  Heparin level remains undetectable.   Plan: Continue heparin only in purge solution for now. Perhaps after chest is closed, could consider adding systemic heparin for Impella as well. Daily heparin level and CBC.  Reece Leader, Colon Flattery, BCCP Clinical Pharmacist  08/09/2019 2:36 PM   [facility] pharmacy phone numbers are listed on [email or web address]

## 2019-08-09 NOTE — Progress Notes (Signed)
Pharmacy Antibiotic Note  Todd [name] is a 64 y.o. male admitted on 07/26/2019 s/p CABG c/b cardiac arrest requiring ECMO cannulation. Pt started on vancomycin and meropenem with open chest. Creatinine remains stable, chest closure tentatively planned for Friday.   Despite stable serum creatinine, Vancomycin trough came back elevated at 33 mcg/ml.  Repeat level today down to 22.  Calculated K 0.026 Half life 26.6 hrs  Plan: Restart vancomycin at 1750 mg IV q 36 hrs. Next dose due tomorrow AM. Repeat vancomycin levels at steady state vs. D/c vancomycin after chest closure?  Height: 5\' 9"  (175.3 cm) Weight: (S) 86.7 kg (191 lb 2.2 oz) IBW/kg (Calculated) : 70.7  Temp (24hrs), Avg:99 F (37.2 C), Min:97.7 F (36.5 C), Max:100.2 F (37.9 C)  Recent Labs  Lab 08/03/19 0950 08/03/19 1308 08/03/19 1616 08/03/19 1616 08/03/19 1941 08/03/19 1941 08/04/19 0513 08/04/19 1700 08/05/19 0343 08/05/19 1604 08/07/19 0347 08/07/19 0910 08/07/19 1442 08/07/19 1442 08/08/19 0302 08/08/19 1532 08/08/19 2054 08/09/19 0013 08/09/19 0444 08/09/19 1039 08/09/19 1228  WBC 11.9*   < > 7.9   < > 7.9   < > 9.3   < > 7.8   < >  --  11.2* 6.7  --  11.1* 16.8*  --   --  15.0*  --   --   CREATININE 1.38*   < > 1.38*  --   --    < > 1.16   < > 1.28*   < >   < >  --  1.12   < > 1.22 1.18  --  1.40* 1.17 1.25*  --   LATICACIDVEN 9.1*  --  8.2*  --  4.8*  --  1.3  --  1.3  --   --   --   --   --   --   --   --   --   --   --   --   VANCOTROUGH  --   --   --   --   --   --   --   --   --   --   --   --   --   --   --   --  58*  --   --   --   --   VANCORANDOM  --   --   --   --   --   --   --   --   --   --   --   --   --   --   --   --   --   --   --   --  22   < > = values in this interval not displayed.    Estimated Creatinine Clearance: 64.3 mL/min (A) (by C-G formula based on SCr of 1.25 mg/dL (H)).    Allergies  Allergen Reactions  . Empagliflozin     Other reaction(s): diarrhea  . Other      Other reaction(s): Unknown  . Simvastatin     Other reaction(s): diarrhea  . Sitagliptin     Other reaction(s): diarrhea/abd pain    Antimicrobials this admission: Cefuroxime 6/23 > 6/25 Vancomycin 6/24 > 6/24; resume 6/26 >  Meropenem 6/26 >>  No Cultures  7/26, Reece Leader, BCPS, [facility] Clinical Pharmacist  08/09/2019 2:31 PM   [facility] pharmacy phone numbers are listed on [email or web address]

## 2019-08-09 NOTE — Progress Notes (Signed)
[facility] notified of critical calcium 6.3 as well as morning ABG results, awaiting further orders.

## 2019-08-09 NOTE — Progress Notes (Signed)
NAME:  Todd [name], MRN:  [id number], DOB:  1955/03/12, LOS: 14 ADMISSION DATE:  07/26/2019, CONSULTATION DATE:  08/03/19 REFERRING MD:  Dorris Fetch, CHIEF COMPLAINT:  ECMO   Brief History   VA ECMO for post CABG Vtach arrest  History of present illness   Presented with worsening dyspnea 6/17 c/w CHF exacerbation Cath with severe triple vessel disease Underwent CABG 6/24 Early this AM Vtach arrest Chest opened bedside and cardiac massage initiated as well as multiple cardioversions, amiodarone, bicarb etc Brought to OR and cannulated for VA ECMO PCCM consulted to assist with management Comorbidities include DM, heavy smoking, COPD  Past Medical History  Depression Ischemic cardiomyopathy HTN HLD  Significant Hospital Events   6/24 CABG 6/25 VA cannulation 6/28 decannulated and impella placed 6/29 bedside re-exploration; s/p decannulation. Placement of impella device originally at p8 but decreased to p4 2/2 suction events overnight up to p6. Echo completed and repositioned device. Remained on considerable support with 8 epi and 46 norepi vaso 0.05. continued chest tube output with large clots noted. Heparin thru device. Replacement products ongoing. 60% 8 peep 6/30: bedside mediastinal re-exploration and clean out of hematoma with tamponade and worsening hemodynamics. Pt had large volume transfusion. rebolused with amio 2/2 nsvt episode.  Weight up 48 pounds.  Started diuresis, Lasix drip started. 7/1 iatrogenic respiratory alkalosis, vent rate decreased.   Consults:  CHF, PCCM, TCTS  Procedures:  See below  Significant Diagnostic Tests:  6/22 spirometry with restrictive physiology, preserved FEV1/FVC CXR today with bilateral airspace disease, cannulas in good position, deep left sulcus that was present yesterday, no clear PTX 6/18 echo: LVEF 20-25%, grade I diastolic dysfunction Micro Data:  COVID and HIV Neg  Antimicrobials:  Vanc6/24>> Cefuroxime 6/24>> 6/25 merrem  6/26->  Interim history/subjective:    Objective   Blood pressure 97/72, pulse (Abnormal) 103, temperature 100 F (37.8 C), temperature source Core, resp. rate 20, height 5\' 9"  (1.753 m), weight 95 kg, SpO2 97 %. PAP: (27-53)/(14-35) 29/19 CVP:  [6 mmHg-14 mmHg] 7 mmHg CO:  [5.4 L/min-6.9 L/min] 5.9 L/min CI:  [2.8 L/min/m2-3.6 L/min/m2] 3.2 L/min/m2  Vent Mode: PSV FiO2 (%):  [40 %] 40 % Set Rate:  [20 bmp-24 bmp] 20 bmp PEEP:  [5 cmH20-8 cmH20] 8 cmH20 Plateau Pressure:  [27 cmH20-29 cmH20] 27 cmH20   Intake/Output Summary (Last 24 hours) at 08/09/2019 0827 Last data filed at 08/09/2019 0800 Gross per 24 hour  Intake 9664.63 ml  Output 10/10/2019 ml  Net -1985.37 ml   Filed Weights   08/06/19 0500 08/07/19 0530 08/08/19 0500  Weight: 81.7 kg 85.5 kg 95 kg    Examination: General: This is a 64 year old white male is currently sedated on multiple sedating drips remains on multiple hemodynamic infusions and Impella device HEENT normocephalic atraumatic sclera nonicteric no JVD orally intubated Pulmonary/chest: Equal chest rise, mediastinal surgical site open with cardiac pulsation in mediastinal tubes/drains with decreased bloody output.  Rhonchorous breath sounds, plateau pressure down to 28 after ventilator change Cardiac systolic murmur Abdomen soft, liquid stools Extremities diffuse anasarca, pulses palpable capillary refill brisk GU clear yellow via Foley catheter Neuro sedated heavily   Resolved Hospital Problem list   N/A  Assessment & Plan:   Severe coronary artery disease w/ severe ICM status post CABG complicated by VT arrest, cardiogenic shock and hemorrhagic shock: EF 20 to 25% with severe LV dysfunction and moderate RV dysfunction -VA ECMO; now decannulated -Impella device initiated 6/28 -Has required reexploration x3 of the chest as well  as massive volume of blood transfusion a married has grown MRSA Plan Continuing amiodarone as directed by cardiology/advanced  heart failure team, eventually will need ICD Continue aspirin and Crestor via tube IV heparin Ensure potassium greater than 4 and magnesium greater than 2 Transfusion goals per thoracic surgery and cardiology team Continue to titrate vasoactive drips per heart failure, and Impella device per cardiology/heart failure team Empiric vancomycin day #8, and meropenem day #6.  These were continued given need for reexploration of chest x3, will continue this for now as continues to have significant leukocytosis but can consider narrowing coverage in the next 24 to 48 hours  Acute hypoxic respiratory failure w/ need for mechanical ventilation  -Portable chest x-ray personally reviewed: This demonstrates endotracheal tube in satisfactory position, the right IJ comparing film from previous day there continues to be right greater than left airspace disease, overall aeration looks improved on the left, there may be element of effusion on right -Arterial blood gas reviewed, has both respiratory and metabolic alkalosis, there is significant room to work on ventilator still Plan Continue full ventilator support Adjust minute ventilation, will reduce respiratory rate and allow patient to sit on minute ventilation VAP bundle PAD protocol with RASS goal -4 given open chest Diuretics per heart failure team A.m. chest x-ray Will need to recheck triglycerides in the morning, starting to rise, may require assistive discontinue propofol on 7/2 with this continues to worsen  Fluid and electrolyte imbalance: Likely exacerbated by aggressive diuresis Plan Continue potassium and magnesium replacement via tube and intravenously as needed  DM with hyperglycemia: Glycemic control is acceptable Plan Continue sliding scale  Leukocytosis.  Improving Plan Antibiotics as mentioned above A.m. CBC  Acute blood loss anemia and thrombocytopenia: Now stable status post massive transfusion and reexploration Plan Continue  to trend CBC  Best practice:  Diet: ok to resume TF to resume today.  Place core track Pain/Anxiety/Delirium protocol (if indicated): fentanyl, propofol VAP protocol (if indicated): in place DVT prophylaxis:heparin gtt via device GI prophylaxis: PPI Glucose control: see above Mobility: BR Code Status: Full Family Communication: per primary Disposition:  ICU  Critical care time:36 minutes   Simonne Martinet ACNP-BC [facility] Pulmonary/Critical Care Pager # [phone] OR # [phone] if no answer

## 2019-08-09 NOTE — Progress Notes (Signed)
Dr Gala Romney notified about placement signal alarm on Impella. Patient hemodynamically stable at time, Impella remains at 46. MD came to bedside and pulled Impella back to 45 and confirmed with Echo.

## 2019-08-09 NOTE — Progress Notes (Signed)
  Echocardiogram 2D Echocardiogram has been performed.  Todd [name] 08/09/2019, 12:11 PM

## 2019-08-09 NOTE — Progress Notes (Signed)
EVENING ROUNDS NOTE :     [address]       [place] [address]             [phone]                 2 Days Post-Op Procedure(s) (LRB): MEDIASTINAL EXPLORATION POST OPERATIVE OPEN HEART IN 2H02 (N/A)   Total Length of Stay:  LOS: 14 days  Events:   Good diuresis today Likely OR tomorrow for closure    BP 92/62   Pulse 92   Temp 98.1 F (36.7 C)   Resp 20   Ht 5\' 9"  (1.753 m)   Wt (S) 86.7 kg   SpO2 98%   BMI 28.23 kg/m   PAP: (27-39)/(14-34) 31/23 CVP:  [7 mmHg-14 mmHg] 10 mmHg CO:  [4.7 L/min-6.4 L/min] 5.4 L/min CI:  [2.5 L/min/m2-3.4 L/min/m2] 2.9 L/min/m2  Vent Mode: PRVC FiO2 (%):  [40 %] 40 % Set Rate:  [20 bmp-24 bmp] 20 bmp Vt Set:  [500 mL] 500 mL PEEP:  [5 cmH20-8 cmH20] 5 cmH20 Plateau Pressure:  [21 cmH20-29 cmH20] 23 cmH20  . sodium chloride 10 mL/hr at 08/08/19 1200  . sodium chloride Stopped (08/08/19 1200)  . sodium chloride 10 mL/hr at 08/09/19 1600  . sodium chloride 10 mL/hr at 08/07/19 1100  . amiodarone 30 mg/hr (08/09/19 1600)  . electrolyte-148 100 mL/hr at 08/09/19 1600  . epinephrine 3 mcg/min (08/09/19 1600)  . fentaNYL infusion INTRAVENOUS 250 mcg/hr (08/09/19 1600)  . furosemide (LASIX) infusion 8 mg/hr (08/09/19 1600)  . impella catheter heparin 50 unit/mL in dextrose 5%    . lactated ringers Stopped (08/08/19 1200)  . lactated ringers 20 mL/hr at 08/09/19 1600  . lactated ringers 20 mL/hr at 08/07/19 1100  . meropenem (MERREM) IV Stopped (08/09/19 1440)  . milrinone 0.125 mcg/kg/min (08/09/19 1600)  . norepinephrine (LEVOPHED) Adult infusion 14 mcg/min (08/09/19 1600)  . propofol (DIPRIVAN) infusion 60 mcg/kg/min (08/09/19 1600)  . [START ON 08/10/2019] vancomycin    . vasopressin (PITRESSIN) infusion - *FOR SHOCK* 0.04 Units/min (08/09/19 1600)    I/O last 3 completed shifts: In: 10/10/19 [P.O.:120; I.V.:10357.5; Other:147.3; NG/GT:1002.3; IV Piggyback:1951.8] Out: 77824 [Urine:10435; Emesis/NG  output:1775; Stool:60; Chest Tube:1990]   CBC Latest Ref Rng & Units 08/09/2019 08/09/2019 08/09/2019  WBC 4.0 - 10.5 K/uL - - -  Hemoglobin 13.0 - 17.0 g/dL 11.9(L) 11.9(L) 11.2(L)  Hematocrit 39 - 52 % 35.0(L) 35.0(L) 33.0(L)  Platelets 150 - 400 K/uL - - -    BMP Latest Ref Rng & Units 08/09/2019 08/09/2019 08/09/2019  Glucose 70 - 99 mg/dL 10/10/2019) - -  BUN 8 - 23 mg/dL 17 - -  Creatinine 144(R - 1.24 mg/dL 1.54) - -  Sodium 0.08(Q - 145 mmol/L 134(L) 135 135  Potassium 3.5 - 5.1 mmol/L 3.6 3.2(L) 3.5  Chloride 98 - 111 mmol/L 97(L) - -  CO2 22 - 32 mmol/L 24 - -  Calcium 8.9 - 10.3 mg/dL 7.6(L) - -    ABG    Component Value Date/Time   PHART 7.413 08/09/2019 1018   PCO2ART 47.7 08/09/2019 1018   PO2ART 115 (H) 08/09/2019 1018   HCO3 30.3 (H) 08/09/2019 1018   TCO2 32 08/09/2019 1018   ACIDBASEDEF 1.0 08/08/2019 1115   O2SAT 98.0 08/09/2019 1018       10/10/2019, MD 08/09/2019 4:30 PM

## 2019-08-09 NOTE — Consult Note (Signed)
WOC Nurse Consult Note: Patient receiving care in [facility] 2H2.  Patient is on many IV drips, advanced life support devices, chest tubes, and has an open sternum. Reason for Consult: DTI to sacrum/coccyx Wound type: Per my conversation with C. Hinshaw, RN, WTA, the patient has a DTPI to the sacrum/coccyx that is purple and consistent with a DTPI. Pressure Injury POA: No Measurement: see flowsheet.  Due to the patient's criticality, the decision was made not to turn the patient at this time. Wound bed: Drainage (amount, consistency, odor)  Periwound: Dressing procedure/placement/frequency: Continue off loading of the site to the greatest extent possible.  Monitor for further devitalization of tissue and development of slough, eschar, and necrosis. Helmut Muster, RN, MSN, CWOCN, CNS-BC, pager [phone]

## 2019-08-09 NOTE — Progress Notes (Signed)
eLink Physician-Brief Progress Note Patient Name: Todd [name] DOB: [birth year] MRN: [id number]   Date of Service  08/09/2019  HPI/Events of Note  PH 7.5, set rate on ventilator 24, corrected calcium 8.0 gm / dl.  eICU Interventions  Ventilator set rate reduced to 20, Calcium gluconate 2 gm iv x 1        Shriyans Kuenzi U Yolando Gillum 08/09/2019, 6:13 AM

## 2019-08-10 ENCOUNTER — Inpatient Hospital Stay (HOSPITAL_COMMUNITY): Payer: Medicare Other | Admitting: Anesthesiology

## 2019-08-10 ENCOUNTER — Inpatient Hospital Stay (HOSPITAL_COMMUNITY): Payer: Medicare Other

## 2019-08-10 ENCOUNTER — Encounter (HOSPITAL_COMMUNITY)
Admission: RE | Disposition: A | Discharge status: Nursing Home (Includes ICF) | Payer: Self-pay | Source: Home / Self Care | Attending: Thoracic Surgery (Cardiothoracic Vascular Surgery)

## 2019-08-10 DIAGNOSIS — E440 Moderate protein-calorie malnutrition: Secondary | ICD-10-CM | POA: Insufficient documentation

## 2019-08-10 DIAGNOSIS — I97638 Postprocedural hematoma of a circulatory system organ or structure following other circulatory system procedure: Secondary | ICD-10-CM | POA: Diagnosis not present

## 2019-08-10 HISTORY — PX: APPLICATION OF WOUND VAC: SHX5189

## 2019-08-10 HISTORY — PX: STERNAL CLOSURE: SHX6203

## 2019-08-10 HISTORY — PX: TEE WITHOUT CARDIOVERSION: SHX5443

## 2019-08-10 LAB — POCT I-STAT 7, (LYTES, BLD GAS, ICA,H+H)
Acid-Base Excess: 6 mmol/L — ABNORMAL HIGH (ref 0.0–2.0)
Bicarbonate: 31 mmol/L — ABNORMAL HIGH (ref 20.0–28.0)
Calcium, Ion: 0.98 mmol/L — ABNORMAL LOW (ref 1.15–1.40)
HCT: 36 % — ABNORMAL LOW (ref 39.0–52.0)
Hemoglobin: 12.2 g/dL — ABNORMAL LOW (ref 13.0–17.0)
O2 Saturation: 98 %
Patient temperature: 37.6
Potassium: 3.4 mmol/L — ABNORMAL LOW (ref 3.5–5.1)
Sodium: 132 mmol/L — ABNORMAL LOW (ref 135–145)
TCO2: 32 mmol/L (ref 22–32)
pCO2 arterial: 47.8 mmHg (ref 32.0–48.0)
pH, Arterial: 7.423 (ref 7.350–7.450)
pO2, Arterial: 101 mmHg (ref 83.0–108.0)

## 2019-08-10 LAB — COOXEMETRY PANEL
Carboxyhemoglobin: 1.7 % — ABNORMAL HIGH (ref 0.5–1.5)
Methemoglobin: 1.4 % (ref 0.0–1.5)
O2 Saturation: 76 %
Total hemoglobin: 12.4 g/dL (ref 12.0–16.0)

## 2019-08-10 LAB — BASIC METABOLIC PANEL
Anion gap: 15 (ref 5–15)
Anion gap: 15 (ref 5–15)
Anion gap: 15 (ref 5–15)
BUN: 15 mg/dL (ref 8–23)
BUN: 16 mg/dL (ref 8–23)
BUN: 16 mg/dL (ref 8–23)
CO2: 27 mmol/L (ref 22–32)
CO2: 28 mmol/L (ref 22–32)
CO2: 30 mmol/L (ref 22–32)
Calcium: 7.2 mg/dL — ABNORMAL LOW (ref 8.9–10.3)
Calcium: 7.4 mg/dL — ABNORMAL LOW (ref 8.9–10.3)
Calcium: 7.5 mg/dL — ABNORMAL LOW (ref 8.9–10.3)
Chloride: 89 mmol/L — ABNORMAL LOW (ref 98–111)
Chloride: 92 mmol/L — ABNORMAL LOW (ref 98–111)
Chloride: 92 mmol/L — ABNORMAL LOW (ref 98–111)
Creatinine, Ser: 1.27 mg/dL — ABNORMAL HIGH (ref 0.61–1.24)
Creatinine, Ser: 1.41 mg/dL — ABNORMAL HIGH (ref 0.61–1.24)
Creatinine, Ser: 1.45 mg/dL — ABNORMAL HIGH (ref 0.61–1.24)
GFR calc Af Amer: 58 mL/min — ABNORMAL LOW (ref >60)
GFR calc Af Amer: >60 mL/min (ref >60)
GFR calc Af Amer: >60 mL/min (ref >60)
GFR calc non Af Amer: 50 mL/min — ABNORMAL LOW (ref >60)
GFR calc non Af Amer: 52 mL/min — ABNORMAL LOW (ref >60)
GFR calc non Af Amer: 59 mL/min — ABNORMAL LOW (ref >60)
Glucose, Bld: 131 mg/dL — ABNORMAL HIGH (ref 70–99)
Glucose, Bld: 136 mg/dL — ABNORMAL HIGH (ref 70–99)
Glucose, Bld: 147 mg/dL — ABNORMAL HIGH (ref 70–99)
Potassium: 3.1 mmol/L — ABNORMAL LOW (ref 3.5–5.1)
Potassium: 3.1 mmol/L — ABNORMAL LOW (ref 3.5–5.1)
Potassium: 4.2 mmol/L (ref 3.5–5.1)
Sodium: 134 mmol/L — ABNORMAL LOW (ref 135–145)
Sodium: 134 mmol/L — ABNORMAL LOW (ref 135–145)
Sodium: 135 mmol/L (ref 135–145)

## 2019-08-10 LAB — GLUCOSE, CAPILLARY
Glucose-Capillary: 104 mg/dL — ABNORMAL HIGH (ref 70–99)
Glucose-Capillary: 122 mg/dL — ABNORMAL HIGH (ref 70–99)
Glucose-Capillary: 127 mg/dL — ABNORMAL HIGH (ref 70–99)
Glucose-Capillary: 134 mg/dL — ABNORMAL HIGH (ref 70–99)
Glucose-Capillary: 136 mg/dL — ABNORMAL HIGH (ref 70–99)
Glucose-Capillary: 146 mg/dL — ABNORMAL HIGH (ref 70–99)
Glucose-Capillary: 149 mg/dL — ABNORMAL HIGH (ref 70–99)
Glucose-Capillary: 150 mg/dL — ABNORMAL HIGH (ref 70–99)

## 2019-08-10 LAB — CBC
HCT: 37.2 % — ABNORMAL LOW (ref 39.0–52.0)
HCT: 35.4 % — ABNORMAL LOW (ref 39.0–52.0)
Hemoglobin: 12.5 g/dL — ABNORMAL LOW (ref 13.0–17.0)
Hemoglobin: 11.9 g/dL — ABNORMAL LOW (ref 13.0–17.0)
MCH: 29.7 pg (ref 26.0–34.0)
MCH: 29.7 pg (ref 26.0–34.0)
MCHC: 33.6 g/dL (ref 30.0–36.0)
MCHC: 33.6 g/dL (ref 30.0–36.0)
MCV: 88.4 fL (ref 80.0–100.0)
MCV: 88.3 fL (ref 80.0–100.0)
Platelets: 95 10*3/uL — ABNORMAL LOW (ref 150–400)
Platelets: 103 10*3/uL — ABNORMAL LOW (ref 150–400)
RBC: 4.21 MIL/uL — ABNORMAL LOW (ref 4.22–5.81)
RBC: 4.01 MIL/uL — ABNORMAL LOW (ref 4.22–5.81)
RDW: 15.4 % (ref 11.5–15.5)
RDW: 15.4 % (ref 11.5–15.5)
WBC: 16.2 10*3/uL — ABNORMAL HIGH (ref 4.0–10.5)
WBC: 26.2 10*3/uL — ABNORMAL HIGH (ref 4.0–10.5)
nRBC: 0.2 % (ref 0.0–0.2)
nRBC: 0.2 % (ref 0.0–0.2)

## 2019-08-10 LAB — COMPREHENSIVE METABOLIC PANEL
ALT: 22 U/L (ref 0–44)
AST: 62 U/L — ABNORMAL HIGH (ref 15–41)
Albumin: 1.9 g/dL — ABNORMAL LOW (ref 3.5–5.0)
Alkaline Phosphatase: 108 U/L (ref 38–126)
Anion gap: 15 (ref 5–15)
BUN: 17 mg/dL (ref 8–23)
CO2: 28 mmol/L (ref 22–32)
Calcium: 7.5 mg/dL — ABNORMAL LOW (ref 8.9–10.3)
Chloride: 92 mmol/L — ABNORMAL LOW (ref 98–111)
Creatinine, Ser: 1.41 mg/dL — ABNORMAL HIGH (ref 0.61–1.24)
GFR calc Af Amer: >60 mL/min (ref >60)
GFR calc non Af Amer: 52 mL/min — ABNORMAL LOW (ref >60)
Glucose, Bld: 142 mg/dL — ABNORMAL HIGH (ref 70–99)
Potassium: 3.3 mmol/L — ABNORMAL LOW (ref 3.5–5.1)
Sodium: 135 mmol/L (ref 135–145)
Total Bilirubin: 6.2 mg/dL — ABNORMAL HIGH (ref 0.3–1.2)
Total Protein: 4.5 g/dL — ABNORMAL LOW (ref 6.5–8.1)

## 2019-08-10 LAB — HEPARIN LEVEL (UNFRACTIONATED): Heparin Unfractionated: <0.1 IU/mL — ABNORMAL LOW (ref 0.30–0.70)

## 2019-08-10 LAB — BILIRUBIN, FRACTIONATED(TOT/DIR/INDIR)
Bilirubin, Direct: 4.1 mg/dL — ABNORMAL HIGH (ref 0.0–0.2)
Indirect Bilirubin: 2.1 mg/dL — ABNORMAL HIGH (ref 0.3–0.9)
Total Bilirubin: 6.2 mg/dL — ABNORMAL HIGH (ref 0.3–1.2)

## 2019-08-10 LAB — ECHO INTRAOPERATIVE TEE
Height: 69 in
Weight: 2867.74 oz

## 2019-08-10 LAB — LACTATE DEHYDROGENASE: LDH: 495 U/L — ABNORMAL HIGH (ref 98–192)

## 2019-08-10 LAB — TRIGLYCERIDES: Triglycerides: 498 mg/dL — ABNORMAL HIGH (ref <150)

## 2019-08-10 LAB — MAGNESIUM: Magnesium: 1.9 mg/dL (ref 1.7–2.4)

## 2019-08-10 LAB — APTT: aPTT: 43 seconds — ABNORMAL HIGH (ref 24–36)

## 2019-08-10 SURGERY — CLOSURE, STERNUM
Anesthesia: General | Site: Chest

## 2019-08-10 MED ORDER — VANCOMYCIN HCL IN DEXTROSE 1-5 GM/200ML-% IV SOLN
INTRAVENOUS | Status: AC
  Filled 2019-08-10: qty 200

## 2019-08-10 MED ORDER — ALBUMIN HUMAN 5 % IV SOLN
INTRAVENOUS | Status: AC
  Filled 2019-08-10: qty 250

## 2019-08-10 MED ORDER — AMIODARONE HCL IN DEXTROSE 360-4.14 MG/200ML-% IV SOLN
60.0000 mg/h | INTRAVENOUS | Status: AC
  Administered 2019-08-10: 60 mg/h via INTRAVENOUS

## 2019-08-10 MED ORDER — POTASSIUM CHLORIDE 10 MEQ/50ML IV SOLN
10.0000 meq | INTRAVENOUS | Status: AC
  Administered 2019-08-10 (×4): 10 meq via INTRAVENOUS
  Filled 2019-08-10 (×4): qty 50

## 2019-08-10 MED ORDER — SODIUM CHLORIDE 0.9 % IR SOLN
Status: DC | PRN
  Administered 2019-08-10: 2000 mL

## 2019-08-10 MED ORDER — POTASSIUM CHLORIDE 10 MEQ/50ML IV SOLN
10.0000 meq | INTRAVENOUS | Status: AC
  Administered 2019-08-10 (×6): 10 meq via INTRAVENOUS
  Filled 2019-08-10 (×4): qty 50

## 2019-08-10 MED ORDER — LACTATED RINGERS IV SOLN: INTRAVENOUS | Status: DC | PRN

## 2019-08-10 MED ORDER — ROCURONIUM BROMIDE 10 MG/ML (PF) SYRINGE
PREFILLED_SYRINGE | INTRAVENOUS | Status: DC | PRN
  Administered 2019-08-10: 50 mg via INTRAVENOUS

## 2019-08-10 MED ORDER — VANCOMYCIN HCL 1000 MG IV SOLR
INTRAVENOUS | Status: DC | PRN
  Administered 2019-08-10: 1000 mL

## 2019-08-10 MED ORDER — CALCIUM CHLORIDE 10 % IV SOLN
INTRAVENOUS | Status: AC
  Filled 2019-08-10: qty 10

## 2019-08-10 MED ORDER — VANCOMYCIN HCL 1000 MG IV SOLR
INTRAVENOUS | Status: DC | PRN
  Administered 2019-08-10: 1000 mg via INTRAVENOUS

## 2019-08-10 MED ORDER — CALCIUM CHLORIDE 10 % IV SOLN
1.0000 g | Freq: Once | INTRAVENOUS | Status: AC
  Administered 2019-08-10: 1 g via INTRAVENOUS

## 2019-08-10 MED ORDER — SODIUM CHLORIDE 0.9 % IR SOLN
Status: DC | PRN
  Administered 2019-08-10 (×2): 1000 mL

## 2019-08-10 MED ORDER — ALBUMIN HUMAN 5 % IV SOLN
12.5000 g | Freq: Once | INTRAVENOUS | Status: AC
  Administered 2019-08-10: 12.5 g via INTRAVENOUS

## 2019-08-10 MED ORDER — MAGNESIUM SULFATE 2 GM/50ML IV SOLN
2.0000 g | Freq: Once | INTRAVENOUS | Status: AC
  Administered 2019-08-10: 2 g via INTRAVENOUS
  Filled 2019-08-10: qty 50

## 2019-08-10 MED ORDER — SODIUM CHLORIDE 0.9 % IV SOLN
1.0000 g | Freq: Once | INTRAVENOUS | Status: DC
  Filled 2019-08-10: qty 10

## 2019-08-10 MED ORDER — POTASSIUM CHLORIDE 10 MEQ/50ML IV SOLN
10.0000 meq | INTRAVENOUS | Status: AC
  Administered 2019-08-10 (×6): 10 meq via INTRAVENOUS
  Filled 2019-08-10 (×6): qty 50

## 2019-08-10 MED ORDER — VANCOMYCIN HCL 1000 MG IV SOLR
INTRAVENOUS | Status: DC
  Filled 2019-08-10 (×2): qty 1000

## 2019-08-10 MED ORDER — AMIODARONE HCL IN DEXTROSE 360-4.14 MG/200ML-% IV SOLN
30.0000 mg/h | INTRAVENOUS | Status: DC
  Administered 2019-08-11 – 2019-08-15 (×19): 60 mg/h via INTRAVENOUS
  Administered 2019-08-16 (×2): 30 mg/h via INTRAVENOUS
  Administered 2019-08-16 – 2019-08-17 (×2): 60 mg/h via INTRAVENOUS
  Administered 2019-08-17: 30 mg/h via INTRAVENOUS
  Administered 2019-08-17 – 2019-08-19 (×10): 60 mg/h via INTRAVENOUS
  Administered 2019-08-20: 30 mg/h via INTRAVENOUS
  Administered 2019-08-20: 60 mg/h via INTRAVENOUS
  Administered 2019-08-21 – 2019-08-23 (×6): 30 mg/h via INTRAVENOUS
  Filled 2019-08-10 (×40): qty 200

## 2019-08-10 SURGICAL SUPPLY — 58 items
BAG DECANTER FOR FLEXI CONT (MISCELLANEOUS) ×3 IMPLANT
BENZOIN TINCTURE PRP APPL 2/3 (GAUZE/BANDAGES/DRESSINGS) ×6 IMPLANT
BLADE CLIPPER SURG (BLADE) IMPLANT
BLADE SURG 10 STRL SS (BLADE) IMPLANT
CANISTER SUCT 3000ML PPV (MISCELLANEOUS) ×3 IMPLANT
CANISTER WOUND CARE 500ML ATS (WOUND CARE) ×3 IMPLANT
CLIP VESOCCLUDE SM WIDE 24/CT (CLIP) IMPLANT
CNTNR URN SCR LID CUP LEK RST (MISCELLANEOUS) IMPLANT
CONT SPEC 4OZ STRL OR WHT (MISCELLANEOUS)
DRAPE CARDIOVASCULAR INCISE (DRAPES)
DRAPE LAPAROSCOPIC ABDOMINAL (DRAPES) IMPLANT
DRAPE SRG 135X102X78XABS (DRAPES) IMPLANT
DRSG AQUACEL AG ADV 3.5X14 (GAUZE/BANDAGES/DRESSINGS) IMPLANT
DRSG VAC ATS LRG SENSATRAC (GAUZE/BANDAGES/DRESSINGS) IMPLANT
DRSG VAC ATS MED SENSATRAC (GAUZE/BANDAGES/DRESSINGS) IMPLANT
DRSG VAC ATS SM SENSATRAC (GAUZE/BANDAGES/DRESSINGS) IMPLANT
ELECT BLADE 4.0 EZ CLEAN MEGAD (MISCELLANEOUS) ×3
ELECT REM PT RETURN 9FT ADLT (ELECTROSURGICAL) ×3
ELECTRODE BLDE 4.0 EZ CLN MEGD (MISCELLANEOUS) ×2 IMPLANT
ELECTRODE REM PT RTRN 9FT ADLT (ELECTROSURGICAL) ×2 IMPLANT
GAUZE SPONGE 4X4 12PLY STRL (GAUZE/BANDAGES/DRESSINGS) ×3 IMPLANT
GAUZE XEROFORM 5X9 LF (GAUZE/BANDAGES/DRESSINGS) IMPLANT
GLOVE BIOGEL PI IND STRL 6.5 (GLOVE) ×6 IMPLANT
GLOVE BIOGEL PI INDICATOR 6.5 (GLOVE) ×3
GLOVE NEODERM STRL 7.5 LF PF (GLOVE) ×4 IMPLANT
GLOVE SURG NEODERM 7.5  LF PF (GLOVE) ×2
GOWN STRL REUS W/ TWL LRG LVL3 (GOWN DISPOSABLE) ×8 IMPLANT
GOWN STRL REUS W/TWL LRG LVL3 (GOWN DISPOSABLE) ×4
HANDPIECE INTERPULSE COAX TIP (DISPOSABLE) ×1
HEMOSTAT POWDER SURGIFOAM 1G (HEMOSTASIS) IMPLANT
HEMOSTAT SURGICEL 2X14 (HEMOSTASIS) IMPLANT
IV SODIUM CHL 0.9% 500ML (IV SOLUTION) ×6 IMPLANT
KIT BASIN OR (CUSTOM PROCEDURE TRAY) ×3 IMPLANT
KIT PREVENA INCISION MGT20CM45 (CANNISTER) ×3 IMPLANT
KIT TURNOVER KIT B (KITS) ×3 IMPLANT
MARKER SKIN DUAL TIP RULER LAB (MISCELLANEOUS) IMPLANT
NS IRRIG 1000ML POUR BTL (IV SOLUTION) ×6 IMPLANT
PACK CHEST (CUSTOM PROCEDURE TRAY) ×3 IMPLANT
PACK UNIVERSAL I (CUSTOM PROCEDURE TRAY) ×3 IMPLANT
PAD ARMBOARD 7.5X6 YLW CONV (MISCELLANEOUS) IMPLANT
PUTTY DBX 5CC (Putty) ×3 IMPLANT
SET HNDPC FAN SPRY TIP SCT (DISPOSABLE) ×2 IMPLANT
SOL PREP POV-IOD 4OZ 10% (MISCELLANEOUS) ×3 IMPLANT
STAPLER VISISTAT 35W (STAPLE) ×3 IMPLANT
SUT ETHILON 3 0 FSL (SUTURE) IMPLANT
SUT MNCRL AB 3-0 PS2 18 (SUTURE) ×6 IMPLANT
SUT PDS AB 1 CTX 36 (SUTURE) ×6 IMPLANT
SUT STEEL 6MS V (SUTURE) ×6 IMPLANT
SUT STEEL STERNAL CCS#1 18IN (SUTURE) IMPLANT
SUT STEEL SZ 6 DBL 3X14 BALL (SUTURE) IMPLANT
SUT VIC AB 2-0 CT1 27 (SUTURE) ×1
SUT VIC AB 2-0 CT1 TAPERPNT 27 (SUTURE) ×2 IMPLANT
SUT VIC AB 2-0 CTX 27 (SUTURE) ×6 IMPLANT
SWAB COLLECTION DEVICE MRSA (MISCELLANEOUS) IMPLANT
SWAB CULTURE ESWAB REG 1ML (MISCELLANEOUS) IMPLANT
TOWEL GREEN STERILE (TOWEL DISPOSABLE) ×3 IMPLANT
TOWEL GREEN STERILE FF (TOWEL DISPOSABLE) ×3 IMPLANT
WATER STERILE IRR 1000ML POUR (IV SOLUTION) ×3 IMPLANT

## 2019-08-10 NOTE — Anesthesia Postprocedure Evaluation (Signed)
Anesthesia Post Note  Patient: Omarii Scalzo  Procedure(s) Performed: STERNAL CLOSURE (N/A ) TRANSESOPHAGEAL ECHOCARDIOGRAM (TEE) (N/A ) APPLICATION OF WOUND VAC (N/A Chest)     Patient location during evaluation: SICU Anesthesia Type: General Level of consciousness: sedated Pain management: pain level controlled Vital Signs Assessment: post-procedure vital signs reviewed and stable Respiratory status: patient remains intubated per anesthesia plan Cardiovascular status: stable Postop Assessment: no apparent nausea or vomiting Anesthetic complications: no   No complications documented.  Last Vitals:  Vitals:   08/10/19 2202 08/10/19 2215  BP: (!) 83/66 (!) 86/65  Pulse: 89 81  Resp: 20 (!) 21  Temp: 37.3 C 37.3 C  SpO2: 96% 95%    Last Pain:  Vitals:   08/10/19 0800  TempSrc: Core  PainSc:                  Catheryn Bacon Raiden Haydu

## 2019-08-10 NOTE — H&P (Signed)
History and Physical Interval Note:  08/10/2019 3:31 PM  Todd [name]  has presented today for surgery, with the diagnosis of chest wound exploration/ CARDIAC TAMPONADE.  The various methods of treatment have been discussed with the patient and family. After consideration of risks, benefits and other options for treatment, the patient has consented to  PROCEDURE: CHEST CLOSURE  as a surgical intervention.  The patient's history has been reviewed, patient examined, no change in status, stable for surgery.  I have reviewed the patient's chart and labs.  Questions were answered to the patient's satisfaction.     [name]

## 2019-08-10 NOTE — Brief Op Note (Signed)
08/10/2019  11:42 PM  PATIENT:  Todd [name]  64 y.o. male  PRE-OPERATIVE DIAGNOSIS:  open chest  POST-OPERATIVE DIAGNOSIS:  open chest  PROCEDURE:  Procedure(s) with comments: STERNAL CLOSURE (N/A) TRANSESOPHAGEAL ECHOCARDIOGRAM (TEE) (N/A) APPLICATION OF WOUND VAC (N/A) - Prevena dressing to wound vac  SURGEON:  Surgeon(s) and Role:    * [name], MD - Primary  PHYSICIAN ASSISTANT: n/a  ASSISTANTS: staff   ANESTHESIA:   general  EBL:  minimal   BLOOD ADMINISTERED:none  DRAINS: 4 Chest Tube(s) in the mediastinum and bilateral pleural spaces   LOCAL MEDICATIONS USED:  NONE  SPECIMEN:  No Specimen  DISPOSITION OF SPECIMEN:  N/A  COUNTS:  YES  TOURNIQUET:  * No tourniquets in log *  DICTATION: .Note written in EPIC  PLAN OF CARE: Admit to inpatient   PATIENT DISPOSITION:  ICU - intubated and critically ill.   Delay start of Pharmacological VTE agent (>24hrs) due to surgical blood loss or risk of bleeding: yes

## 2019-08-10 NOTE — Plan of Care (Signed)
Pt remains intubated and sedated on propofol and fentanyl. Epi, vaso and levo continue to infuse to maintain blood pressure per MD orders. Lasix drip to diurese the patient with adequate urine output noted. Fecal management system with liquid stool. Will continue to diurese and maintain blood pressure and impella.

## 2019-08-10 NOTE — Progress Notes (Addendum)
NAME:  Todd [name], MRN:  [id number], DOB:  1955/12/13, LOS: 15 ADMISSION DATE:  07/26/2019, CONSULTATION DATE:  08/03/19 REFERRING MD:  Dorris Fetch, CHIEF COMPLAINT:  ECMO   Brief History   VA ECMO for post CABG Vtach arrest  History of present illness   Presented with worsening dyspnea 6/17 c/w CHF exacerbation Cath with severe triple vessel disease Underwent CABG 6/24 Early this AM Vtach arrest Chest opened bedside and cardiac massage initiated as well as multiple cardioversions, amiodarone, bicarb etc Brought to OR and cannulated for VA ECMO PCCM consulted to assist with management Comorbidities include DM, heavy smoking, COPD  Past Medical History  Depression Ischemic cardiomyopathy HTN HLD  Significant Hospital Events   6/24 CABG 6/25 VA cannulation 6/28 decannulated and impella placed 6/29 bedside re-exploration; s/p decannulation. Placement of impella device originally at p8 but decreased to p4 2/2 suction events overnight up to p6. Echo completed and repositioned device. Remained on considerable support with 8 epi and 46 norepi vaso 0.05. continued chest tube output with large clots noted. Heparin thru device. Replacement products ongoing. 60% 8 peep 6/30: bedside mediastinal re-exploration and clean out of hematoma with tamponade and worsening hemodynamics. Pt had large volume transfusion. rebolused with amio 2/2 nsvt episode.  Weight up 48 pounds.  Started diuresis, Lasix drip started. 7/1 iatrogenic respiratory alkalosis, vent rate decreased. 7/2: No significant issues overnight remains on pressors and Impella not tolerating tube feeds with high gastric output awaiting core track placement  Consults:  CHF, PCCM, TCTS  Procedures:  See below  Significant Diagnostic Tests:  6/22 spirometry with restrictive physiology, preserved FEV1/FVC CXR today with bilateral airspace disease, cannulas in good position, deep left sulcus that was present yesterday, no clear  PTX 6/18 echo: LVEF 20-25%, grade I diastolic dysfunction Micro Data:  COVID and HIV Neg  Antimicrobials:  Vanc6/24>> Cefuroxime 6/24>> 6/25 merrem 6/26->  Interim history/subjective:   No significant issues overnight with the exception of not tolerating tube feeds Objective   Blood pressure 104/79, pulse (Abnormal) 104, temperature 99.9 F (37.7 C), resp. rate 20, height 5\' 9"  (1.753 m), weight 81.3 kg, SpO2 98 %. PAP: (25-39)/(17-34) 27/18 CVP:  [7 mmHg-11 mmHg] 10 mmHg CO:  [4.7 L/min-5.7 L/min] 4.8 L/min CI:  [2.5 L/min/m2-3 L/min/m2] 2.5 L/min/m2  Vent Mode: PRVC FiO2 (%):  [40 %] 40 % Set Rate:  [20 bmp] 20 bmp Vt Set:  [500 mL] 500 mL PEEP:  [5 cmH20] 5 cmH20 Plateau Pressure:  [21 cmH20-24 cmH20] 22 cmH20   Intake/Output Summary (Last 24 hours) at 08/10/2019 0816 Last data filed at 08/10/2019 0700 Gross per 24 hour  Intake 6693.44 ml  Output 9360 ml  Net -2666.56 ml   Filed Weights   08/08/19 0500 08/09/19 1345 08/10/19 0730  Weight: 95 kg (Significant) 86.7 kg 81.3 kg    Examination: General sedated 64 year old white male currently on full ventilatory support and Impella support Neuro: Heavily sedated HEENT sclera mildly icteric, pupils equal reactive, orally intubated, no clear JVD mucous membranes are dry Pulmonary equal chest rise, rhonchorous breath sounds.  Chest surgery site open dressing intact Cardiac regular rhythm Abdomen hypoactive, liquid stools. Extremities are cool, pulses are audible with Doppler, capillary refill present and less than 2 seconds GU clear yellow urine  Resolved Hospital Problem list   N/A  Assessment & Plan:   Severe coronary artery disease w/ severe ICM status post CABG complicated by VT arrest, cardiogenic shock and hemorrhagic shock: EF 20 to 25% with severe LV  dysfunction and moderate RV dysfunction -VA ECMO; now decannulated -Impella device initiated 6/28 -Has required reexploration x3 of the chest as well as massive  volume of blood transfusion Plan Continue current amiodarone infusion, he will eventually need ICD  Continue aspirin and Crestor  IV heparin  Potassium and magnesium goals addressed below  Transfusion indices per thoracic team  Continue to titrate vasoactive drips for heart failure with ongoing titration of Impella device  Cont current vanc day 9; 7 meropenem stop date to be determined  Acute hypoxic respiratory failure w/ need for mechanical ventilation  -ABG reviewed gas exchange acceptable following vent changes on 7/1 -Remains positive over 5 L -Portable chest x-ray personally reviewed endotracheal tube in satisfactory position, right basilar atelectasis, overall aeration improved Plan Continue current ventilator settings  VAP bundle  PAD protocol with RASS goal negative for given open chest  Diuretics per heart failure team  A.m. chest x-ray  Triglyceride level seems to have plateaued, will continue to trend while on propofol  Fluid and electrolyte imbalance: Likely exacerbated by aggressive diuresis Plan Continue magnesium and potassium replacement as indicated, will potassium greater than 4  magnesium greater than 2  DM with hyperglycemia: Glycemic control remains acceptable Plan Sliding scale insulin  Leukocytosis.  About the same Plan Antibiotics as mentioned above, a.m. CBC  Acute blood loss anemia and thrombocytopenia:Hemoglobin remains stable no evidence of active bleeding, platelets acceptable Plan Continue to trend, surgical team is ordered type and cross for planned possible closure later today  Best practice:  Diet: ok to resume TF to resume today.  Place core track Pain/Anxiety/Delirium protocol (if indicated): fentanyl, propofol VAP protocol (if indicated): in place DVT prophylaxis:heparin gtt via device GI prophylaxis: PPI Glucose control: see above Mobility: BR Code Status: Full Family Communication: per primary Disposition:  ICU  Critical care  time:35 minutes   Simonne Martinet ACNP-BC [facility] Pulmonary/Critical Care Pager # [phone] OR # [phone] if no answer

## 2019-08-10 NOTE — Anesthesia Preprocedure Evaluation (Signed)
Anesthesia Evaluation  Patient identified by MRN, date of birth, ID band Patient unresponsive    Reviewed: Allergy & Precautions, NPO status , Patient's Chart, lab work & pertinent test results, Unable to perform ROS - Chart review only  History of Anesthesia Complications Negative for: history of anesthetic complications  Airway Mallampati: Intubated       Dental  (+) Edentulous Upper, Poor Dentition, Missing   Pulmonary Current Smoker and Patient abstained from smoking.,       + intubated    Cardiovascular hypertension, + CAD, + CABG, + Peripheral Vascular Disease and +CHF  + dysrhythmias Ventricular Fibrillation  Rhythm:Regular Rate:Normal  Pt is s/p Vfib arrest following extubation after CABG, required open cardiac massage, emergent exploration, and ECMO support   On Epi, vasopressin, milrinone, phenylephrine infusions  Post ECMO ECHO: EF 15-20%, mild MR   Neuro/Psych PSYCHIATRIC DISORDERS Depression negative neurological ROS     GI/Hepatic negative GI ROS, (+)     substance abuse  marijuana use,   Endo/Other  diabetes, Insulin DependentHypothyroidism   Renal/GU Renal InsufficiencyRenal disease (creat 1.23)     Musculoskeletal  (+) Arthritis ,   Abdominal   Peds  Hematology  (+) Blood dyscrasia (plt 48k, Hb 8.8), anemia ,   Anesthesia Other Findings   Reproductive/Obstetrics                             Anesthesia Physical Anesthesia Plan  ASA: IV  Anesthesia Plan: General   Post-op Pain Management:    Induction: Intravenous  PONV Risk Score and Plan: 1 and Treatment may vary due to age or medical condition  Airway Management Planned: Oral ETT  Additional Equipment: Arterial line and CVP  Intra-op Plan:   Post-operative Plan: Post-operative intubation/ventilation  Informed Consent:     History available from chart only  Plan Discussed with: CRNA and  Surgeon  Anesthesia Plan Comments:         Anesthesia Quick Evaluation

## 2019-08-10 NOTE — Progress Notes (Signed)
CT surgery p.m. Rounds  Return from OR after sternal closure Hemodynamically stable Chest tube output satisfactory Blood pressure 113/74, pulse 92, temperature 98.6 F (37 C), resp. rate 20, height 5\' 9"  (1.753 m), weight 81.3 kg, SpO2 98 %.

## 2019-08-10 NOTE — Progress Notes (Signed)
Nutrition Follow-up  DOCUMENTATION CODES:   Non-severe (moderate) malnutrition in context of acute illness/injury  INTERVENTION:   Recommend trial of reglan or erythromycin to promote GI motility  Agree with post-pyloric Cortrak placement; attempt today and hope that Cortrak can be retained during surgery  If pt unable to have post-pyloric feeding tube place and pt continues with intolerance to TF, recommend considering TPN given increased nutritional needs, new skin breakdown, limited nutrition since surgery  Tube Feeding via Cortrak:  Resume Pivot 1.5 at 20 ml/hr Pro-Stat 60 mL TID Meets 100% protein needs; goal rate to follow if pt able to tolerate trickle TF   NUTRITION DIAGNOSIS:   Moderate Malnutrition related to acute illness as evidenced by mild fat depletion, mild muscle depletion, moderate muscle depletion.  Continues   GOAL:   Patient will meet greater than or equal to 90% of their needs  MONITOR:   Vent status, TF tolerance, Labs, Weight trends  REASON FOR ASSESSMENT:   Consult  (EMCO tube feeding recommendations)  ASSESSMENT:   Patient with PMH significant for CHF, COPD, HTN, DM, HLD, MDD, and BPH. Presents this admission with CHF exacerbation.   6/24- s/p CABG x4 6/25- VT arrest, emergent sternotomy, [place] ECMO cannulation 6/28- De-cannulated, Impella placed 6/29- Bedside Mediastinal Exploration 2/2 tamponade with multiple blood clots removed  Plan to return to OR today for sternal closure  Pivot 1.5 previously infusing at only 20 ml/hr Noted pt with emesis over night, high output via  OG tube (>2L)  Weight down significantly in the last 2 days due to diuresis; current wt 81.3 kg. Pt with almost 17 L UOP in the last 48 hours  Noted pt with newly documented DTI; also new nutrition focused physical exam post significant diuresis revealed muscle wasting and fat loss meeting criteria for malnutrition  Plan for Cortrak to be placed today in  post-pyloric position. Concerned for possible dislodgement during surgery later today given pt to have TEE. Cortrak service not available again until Tuesday next week. Radiology an option but would need C-arm to come to patient room, unsure if this could be done over the weekend. Discussed with RN and MDs; plan to proceed with attempting Cortrak today and hopefully can be retained during surgery  Labs: Creatinine 1.45, BUN wdl, potassium 4.2 (wdl), Meds: lasix drip, MVI with Minerals  NUTRITION - FOCUSED PHYSICAL EXAM:    Most Recent Value  Orbital Region Mild depletion  Upper Arm Region Moderate depletion  Thoracic and Lumbar Region Unable to assess  Buccal Region Unable to assess  Temple Region Moderate depletion  Clavicle Bone Region Mild depletion  Clavicle and Acromion Bone Region Mild depletion  Scapular Bone Region Moderate depletion  Dorsal Hand Unable to assess  Patellar Region Moderate depletion  Anterior Thigh Region Moderate depletion  Posterior Calf Region Moderate depletion  Edema (RD Assessment) Moderate  Hair Reviewed  Eyes Unable to assess  Mouth Unable to assess  Skin Reviewed  Nails Reviewed       Diet Order:   Diet Order            Diet NPO time specified  Diet effective now                 EDUCATION NEEDS:   Education needs have been addressed  Skin:  Skin Assessment: Skin Integrity Issues: Skin Integrity Issues:: DTI DTI: sacrum Incisions: R leg, R groin Other: open chest  Last BM:  7/2 rectal tube  Height:   Ht Readings  from Last 1 Encounters:  08/10/19 5\' 9"  (1.753 m)    Weight:   Wt Readings from Last 1 Encounters:  08/10/19 81.3 kg    BMI:  Body mass index is 26.47 kg/m.  Estimated Nutritional Needs:   Kcal:  1820-2184 kcal  Protein:  110-146  Fluid:  >/= 1.8 L/day    10/11/19 MS, RDN, LDN, CNSC Registered Dietitian III Clinical Nutrition RD Pager and On-Call Pager Number Located in [place]

## 2019-08-10 NOTE — Progress Notes (Signed)
Patient ID: Todd [name], male   DOB: [birth year], 64 y.o.   MRN: [id number]     Advanced Heart Failure Rounding Note  PCP-Cardiologist: No primary care provider on file.   Subjective:    08/02/19 CABG 08/03/19 VF arrest. Chest open at bedside 08/03/19 Back to OR for ECMO cannulation 08/03/19 Washout out for tamponade 08/04/19 Repeat bedside washout for tamponade 08/06/19 To OR for decannulation and Impella 5.5 08/07/19 Underwent emergent bedside chest exploration at bedside for tamponade   Remains on vent with Impella 5.5. Pressor requirements down but still on NE 12 , EPI 4, VP 0.04 and milrinone 0.125  Sedated on propofol. Remains on lasix gtt at 8. Excellent diuresis. Weight down 30 pounds. Weeping from skin sites.   Developed AFL this am and I terminated with RAP.   CT drainage 160 total overnight (110, 50)   Impella 5.5 P-8 Good pulsativity Flow 4.3L   [place] CVP 8-9 PA 28/17 (21) Thermo 5.0/2.7 SVR ~900 PAPi 1.4   Objective:   Weight Range: 81.3 kg Body mass index is 26.47 kg/m.   Vital Signs:   Temp:  [97.7 F (36.5 C)-100 F (37.8 C)] 99.9 F (37.7 C) (07/02 0730) Pulse Rate:  [63-108] 88 (07/02 0730) Resp:  [14-23] 20 (07/02 0730) BP: (82-112)/(62-84) 108/79 (07/02 0700) SpO2:  [91 %-100 %] 98 % (07/02 0730) Arterial Line BP: (75-111)/(57-84) 84/60 (07/02 0730) FiO2 (%):  [40 %] 40 % (07/02 0700) Weight:  [81.3 kg-86.7 kg] 81.3 kg (07/02 0730) Last BM Date: 08/09/19  Weight change: Filed Weights   08/08/19 0500 08/09/19 1345 08/10/19 0730  Weight: 95 kg (S) 86.7 kg 81.3 kg    Intake/Output:   Intake/Output Summary (Last 24 hours) at 08/10/2019 0758 Last data filed at 08/10/2019 0700 Gross per 24 hour  Intake 7067.05 ml  Output 9735 ml  Net -2667.95 ml      Physical Exam    General:  Sedated on vent. anasarca HEENT: normal + ETT Neck: supple. RIJ swan. Carotids 2+ bilat; no bruits. No lymphadenopathy or thryomegaly appreciated. Cor: Chest open   + CTs Lungs: coarse Abdomen: soft, nontender, + distended. No hepatosplenomegaly. No bruits or masses. Good bowel sounds. Extremities: no cyanosis, clubbing, rash, 3+ edema weeping  Neuro: intubated/sedated   Telemetry   Sinus 90-110 had AFL this am. Terminated with RAP Personally reviewed  Labs    CBC Recent Labs    08/09/19 0444 08/09/19 0514 08/10/19 0419 08/10/19 0658  WBC 15.0*  --  16.2*  --   HGB 10.9*   < > 12.5* 12.2*  HCT 31.0*   < > 37.2* 36.0*  MCV 86.6  --  88.4  --   PLT 97*  --  95*  --    < > = values in this interval not displayed.   Basic Metabolic Panel Recent Labs    08/09/19 0444 08/09/19 0514 08/10/19 0010 08/10/19 0010 08/10/19 0419 08/10/19 0658  NA 134*   < > 134*   < > 135 132*  K 3.3*   < > 3.1*   < > 3.3* 3.4*  CL 96*   < > 92*  --  92*  --   CO2 23   < > 27  --  28  --   GLUCOSE 98   < > 136*  --  142*  --   BUN 16   < > 15  --  17  --   CREATININE 1.17   < > 1.27*  --  1.41*  --   CALCIUM 6.3*   < > 7.2*  --  7.5*  --   MG 1.8  --   --   --  1.9  --    < > = values in this interval not displayed.   Liver Function Tests Recent Labs    08/09/19 0444 08/10/19 0419  AST 46* 62*  ALT 17 22  ALKPHOS 79 108  BILITOT 4.8* 6.2*  PROT 3.6* 4.5*  ALBUMIN 1.9* 1.9*   No results for input(s): LIPASE, AMYLASE in the last 72 hours. Cardiac Enzymes No results for input(s): CKTOTAL, CKMB, CKMBINDEX, TROPONINI in the last 72 hours.  BNP: BNP (last 3 results) Recent Labs    07/26/19 1236  BNP 2,568.2*    ProBNP (last 3 results) No results for input(s): PROBNP in the last 8760 hours.   D-Dimer No results for input(s): DDIMER in the last 72 hours. Hemoglobin A1C No results for input(s): HGBA1C in the last 72 hours. Fasting Lipid Panel Recent Labs    08/10/19 0419  TRIG 498*   Thyroid Function Tests No results for input(s): TSH, T4TOTAL, T3FREE, THYROIDAB in the last 72 hours.  Invalid input(s): FREET3  Other  results:   Imaging    ECHOCARDIOGRAM LIMITED  Result Date: 08/09/2019    ECHOCARDIOGRAM LIMITED REPORT   Patient Name:   Todd [name] Date of Exam: 08/09/2019 Medical Rec #:  [id number]  Height:       69.0 in Accession #:    [id number] Weight:       209.4 lb Date of Birth:  Apr 12, 1955 BSA:          2.107 m Patient Age:    65 years   BP:           108/84 mmHg Patient Gender: M          HR:           101 bpm. Exam Location:  Inpatient Procedure: Limited Echo Indications:    Impella placement  History:        Patient has prior history of Echocardiogram examinations, most                 recent 08/07/2019. CHF, CAD; Risk Factors:Hypertension, Diabetes,                 Dyslipidemia and Current Smoker.  Sonographer:    Vickie Epley RDCS Referring Phys: [id number] Tandy Grawe R Dartagnan Beavers IMPRESSIONS  1. Study performed at the bedside to confirm Impella left ventricular support device placement.  2. Left ventricular ejection fraction, by estimation, is 20%. The left ventricle has severely decreased function. FINDINGS  Left Ventricle: Left ventricular ejection fraction, by estimation, is 20%. The left ventricle has severely decreased function. Cherlynn Kaiser MD Electronically signed by Cherlynn Kaiser MD Signature Date/Time: 08/09/2019/5:23:44 PM    Final      Medications:     Scheduled Medications: . sodium chloride   Intravenous Once  . artificial tears   Both Eyes Q8H  . aspirin  324 mg Per Tube Daily  . chlorhexidine gluconate (MEDLINE KIT)  15 mL Mouth Rinse BID  . Chlorhexidine Gluconate Cloth  6 each Topical Daily  . citalopram  20 mg Per Tube Daily  . docusate  200 mg Per Tube Daily  . feeding supplement (PIVOT 1.5 CAL)  1,000 mL Per Tube Q24H  . feeding supplement (PRO-STAT SUGAR FREE 64)  60 mL Per Tube TID  . fentaNYL (SUBLIMAZE) injection  50  mcg Intravenous Once  . insulin aspart  0-24 Units Subcutaneous Q4H  . mouth rinse  15 mL Mouth Rinse 10 times per day  . multivitamin with minerals  1 tablet Per  Tube Daily  . pantoprazole (PROTONIX) IV  40 mg Intravenous QHS  . potassium chloride  20 mEq Oral BID  . rosuvastatin  20 mg Per Tube Daily  . sodium chloride flush  10-40 mL Intracatheter Q12H  . sodium chloride flush  3 mL Intravenous Q12H    Infusions: . sodium chloride 10 mL/hr at 08/08/19 1200  . sodium chloride Stopped (08/08/19 1200)  . sodium chloride Stopped (08/10/19 0657)  . sodium chloride 10 mL/hr at 08/07/19 1100  . amiodarone 30 mg/hr (08/10/19 0700)  . electrolyte-148 100 mL/hr at 08/10/19 0700  . epinephrine 4 mcg/min (08/10/19 0700)  . fentaNYL infusion INTRAVENOUS 250 mcg/hr (08/10/19 0700)  . furosemide (LASIX) infusion 8 mg/hr (08/10/19 0700)  . impella catheter heparin 50 unit/mL in dextrose 5%    . lactated ringers Stopped (08/08/19 1200)  . lactated ringers 20 mL/hr at 08/10/19 0700  . lactated ringers 20 mL/hr at 08/07/19 1100  . meropenem (MERREM) IV Stopped (08/10/19 0938)  . milrinone 0.125 mcg/kg/min (08/10/19 0700)  . norepinephrine (LEVOPHED) Adult infusion 12 mcg/min (08/10/19 0700)  . potassium chloride    . propofol (DIPRIVAN) infusion 20 mcg/kg/min (08/10/19 0700)  . vancomycin    . vasopressin (PITRESSIN) infusion - *FOR SHOCK* 0.04 Units/min (08/10/19 0700)    PRN Medications: sodium chloride, sodium chloride, dextrose, fentaNYL, lactated ringers, metoprolol tartrate, midazolam, morphine injection, ondansetron (ZOFRAN) IV, sodium chloride flush, sodium chloride flush   Assessment/Plan   1. Cardiogenic shock: Echo with EF 20-25%, moderate RV dysfunction.  Now on [place] ECMO post-VF arrest, cannulated 6/25.  Stable s/p return to OR for mediastinal re-exploration due to bleeding on 6/25.   - Decannulated from [place] ECMO on 08/06/19. Impella 5.5 placed - Underwent emergent chest re-exploration on 6/29 for tamponade and clot removal - Hemodynamics improved. Weaning pressors.  - Impella 5.5 at P-8. Waveforms ok Position ok on echo - Remains markedly  volume overloaded but improving. Diffuse skin weeping. Continue lasix gtt at 8 - Bleeding from CTs much improved. - Discussed with TCTS. Plan chest closure later today 2. CAD: 3VD - s/p CABG x 4 with LIMA-LAD, SVG-D1, SVG-PDA, and radial-OM1 on 6/24.   - Continue ASA and Crestor per tube.  - No s/s ischemia 3. Cardiac arrest: VF arrest with prolonged code - No further VT. Had AFL this am and paced out Continue IV amiodarone at 30 mg/hr.   - Eventual evaluation for ICD.   - Keep K > 4.0 Mg > 2.0 4. Acute hypoxemic respiratory failure: In setting of cardiac arrest.  H/o COPD.   - On vent CCM following. - On propofol. Watch TGs - Discussed with Dr. Abigail Butts at bedside 5. Acute blood loss anemia: Now s/p mediastinal re-exploration x 3 - Hgb stable today at 12.5  Continue to follow. CT drainage is slowing 6. Thrombocytopenia:  - PLTs 95k 7. ID: Chest open, empirically covering with meropenem and vancomycin.  - no change 8. Hypokalemia/Hypomag - supp 9. Elevated bilirubin - Likely due to RV failure/[place] liver but may have some hemolysis from pump.  - check LDH. Fractionate bili    CRITICAL CARE Performed by: Glori Bickers  Total critical care time: 50 minutes at bedside  Critical care time was exclusive of separately billable procedures and treating other patients.  Critical care was necessary to treat or prevent imminent or life-threatening deterioration.  Critical care was time spent personally by me on the following activities: development of treatment plan with patient and/or surrogate as well as nursing, discussions with consultants, evaluation of patient's response to treatment, examination of patient, obtaining history from patient or surrogate, ordering and performing treatments and interventions, ordering and review of laboratory studies, ordering and review of radiographic studies, pulse oximetry and re-evaluation of patient's condition.   Length of Stay: [place], MD  08/10/2019, 7:58 AM  Advanced Heart Failure Team Pager [phone] (M-F; 7a - 4p)  Please contact [facility] for night-coverage after hours (4p -7a ) and weekends on [email or web address]

## 2019-08-10 NOTE — Plan of Care (Signed)
  Problem: Clinical Measurements: Goal: Ability to maintain clinical measurements within normal limits will improve Outcome: Progressing Goal: Cardiovascular complication will be avoided Outcome: Progressing Note: Continuing to gradually wean pressors.   Problem: Pain Managment: Goal: General experience of comfort will improve Outcome: Progressing Note: CPOT remains 0.   Problem: Nutrition: Goal: Adequate nutrition will be maintained Outcome: Not Progressing Note: OGT still with large amounts of output.   Problem: Skin Integrity: Goal: Risk for impaired skin integrity will decrease Outcome: Not Progressing Note: Worsening DTI on right sacrum/right buttock.

## 2019-08-10 NOTE — Progress Notes (Signed)
My note on 7/1 mentions the patient having "grown MRSA". This was a dragon/dictation error. In The subsequent note on 7/2 I have removed that phrase.   Simonne Martinet ACNP-BC [facility] Pulmonary/Critical Care Pager # [phone] OR # [phone] if no answer

## 2019-08-10 NOTE — Transfer of Care (Signed)
Immediate Anesthesia Transfer of Care Note  Patient: Todd [name]  Procedure(s) Performed: STERNAL CLOSURE (N/A ) TRANSESOPHAGEAL ECHOCARDIOGRAM (TEE) (N/A ) APPLICATION OF WOUND VAC (N/A Chest)  Patient Location: SICU  Anesthesia Type:General  Level of Consciousness: Patient remains intubated per anesthesia plan  Airway & Oxygen Therapy: Patient remains intubated per anesthesia plan  Post-op Assessment: Report given to RN and Post -op Vital signs reviewed and stable  Post vital signs: Reviewed and stable  Last Vitals:  Vitals Value Taken Time  BP    Temp    Pulse 78 08/10/19 1903  Resp 16 08/10/19 1903  SpO2 91 % 08/10/19 1903  Vitals shown include unvalidated device data.  Last Pain:  Vitals:   08/10/19 0800  TempSrc: Core  PainSc:       Patients Stated Pain Goal: 0 (08/10/19 0000)  Complications: No complications documented.

## 2019-08-10 NOTE — Procedures (Signed)
Cortrak  Tube Type:  Cortrak - 43 inches Tube Location:  Left nare Initial Placement:  Stomach Secured by: Bridle Technique Used to Measure Tube Placement:  Documented cm marking at nare/ corner of mouth Cortrak Secured At:  93 cm    Cortrak Tube Team Note:  Consult received to place a Cortrak feeding tube.   X-ray is required, abdominal x-ray has been ordered by the Cortrak team. Please confirm tube placement before using the Cortrak tube.   If the tube becomes dislodged please keep the tube and contact the Cortrak team at [email or web address] ([email or web address]) for replacement.  If after hours and replacement cannot be delayed, place a NG tube and confirm placement with an abdominal x-ray.    Betsey Holiday MS, RD, LDN Please refer to [facility] for RD and/or RD on-call/weekend/after hours pager

## 2019-08-11 ENCOUNTER — Inpatient Hospital Stay (HOSPITAL_COMMUNITY): Payer: Medicare Other

## 2019-08-11 DIAGNOSIS — I519 Heart disease, unspecified: Secondary | ICD-10-CM | POA: Diagnosis not present

## 2019-08-11 LAB — GLUCOSE, CAPILLARY
Glucose-Capillary: 126 mg/dL — ABNORMAL HIGH (ref 70–99)
Glucose-Capillary: 133 mg/dL — ABNORMAL HIGH (ref 70–99)
Glucose-Capillary: 128 mg/dL — ABNORMAL HIGH (ref 70–99)
Glucose-Capillary: 123 mg/dL — ABNORMAL HIGH (ref 70–99)
Glucose-Capillary: 117 mg/dL — ABNORMAL HIGH (ref 70–99)
Glucose-Capillary: 135 mg/dL — ABNORMAL HIGH (ref 70–99)

## 2019-08-11 LAB — CBC
HCT: 32.9 % — ABNORMAL LOW (ref 39.0–52.0)
Hemoglobin: 11.2 g/dL — ABNORMAL LOW (ref 13.0–17.0)
MCH: 30 pg (ref 26.0–34.0)
MCHC: 34 g/dL (ref 30.0–36.0)
MCV: 88.2 fL (ref 80.0–100.0)
Platelets: 100 10*3/uL — ABNORMAL LOW (ref 150–400)
RBC: 3.73 MIL/uL — ABNORMAL LOW (ref 4.22–5.81)
RDW: 15.3 % (ref 11.5–15.5)
WBC: 20.5 10*3/uL — ABNORMAL HIGH (ref 4.0–10.5)
nRBC: 0.2 % (ref 0.0–0.2)

## 2019-08-11 LAB — BASIC METABOLIC PANEL
Anion gap: 14 (ref 5–15)
Anion gap: 13 (ref 5–15)
Anion gap: 15 (ref 5–15)
BUN: 16 mg/dL (ref 8–23)
BUN: 15 mg/dL (ref 8–23)
BUN: 16 mg/dL (ref 8–23)
CO2: 29 mmol/L (ref 22–32)
CO2: 30 mmol/L (ref 22–32)
CO2: 28 mmol/L (ref 22–32)
Calcium: 8.1 mg/dL — ABNORMAL LOW (ref 8.9–10.3)
Calcium: 7.4 mg/dL — ABNORMAL LOW (ref 8.9–10.3)
Calcium: 7.4 mg/dL — ABNORMAL LOW (ref 8.9–10.3)
Chloride: 90 mmol/L — ABNORMAL LOW (ref 98–111)
Chloride: 89 mmol/L — ABNORMAL LOW (ref 98–111)
Chloride: 89 mmol/L — ABNORMAL LOW (ref 98–111)
Creatinine, Ser: 1.38 mg/dL — ABNORMAL HIGH (ref 0.61–1.24)
Creatinine, Ser: 1.28 mg/dL — ABNORMAL HIGH (ref 0.61–1.24)
Creatinine, Ser: 1.31 mg/dL — ABNORMAL HIGH (ref 0.61–1.24)
GFR calc Af Amer: >60 mL/min (ref >60)
GFR calc Af Amer: >60 mL/min (ref >60)
GFR calc Af Amer: >60 mL/min (ref >60)
GFR calc non Af Amer: 53 mL/min — ABNORMAL LOW (ref >60)
GFR calc non Af Amer: 58 mL/min — ABNORMAL LOW (ref >60)
GFR calc non Af Amer: 57 mL/min — ABNORMAL LOW (ref >60)
Glucose, Bld: 110 mg/dL — ABNORMAL HIGH (ref 70–99)
Glucose, Bld: 126 mg/dL — ABNORMAL HIGH (ref 70–99)
Glucose, Bld: 138 mg/dL — ABNORMAL HIGH (ref 70–99)
Potassium: 4.6 mmol/L (ref 3.5–5.1)
Potassium: 2.6 mmol/L — CL (ref 3.5–5.1)
Potassium: 3.2 mmol/L — ABNORMAL LOW (ref 3.5–5.1)
Sodium: 133 mmol/L — ABNORMAL LOW (ref 135–145)
Sodium: 132 mmol/L — ABNORMAL LOW (ref 135–145)
Sodium: 132 mmol/L — ABNORMAL LOW (ref 135–145)

## 2019-08-11 LAB — COMPREHENSIVE METABOLIC PANEL
ALT: 26 U/L (ref 0–44)
AST: 79 U/L — ABNORMAL HIGH (ref 15–41)
Albumin: 2.2 g/dL — ABNORMAL LOW (ref 3.5–5.0)
Alkaline Phosphatase: 110 U/L (ref 38–126)
Anion gap: 17 — ABNORMAL HIGH (ref 5–15)
BUN: 16 mg/dL (ref 8–23)
CO2: 27 mmol/L (ref 22–32)
Calcium: 7.9 mg/dL — ABNORMAL LOW (ref 8.9–10.3)
Chloride: 89 mmol/L — ABNORMAL LOW (ref 98–111)
Creatinine, Ser: 1.42 mg/dL — ABNORMAL HIGH (ref 0.61–1.24)
GFR calc Af Amer: 60 mL/min — ABNORMAL LOW (ref >60)
GFR calc non Af Amer: 51 mL/min — ABNORMAL LOW (ref >60)
Glucose, Bld: 138 mg/dL — ABNORMAL HIGH (ref 70–99)
Potassium: 3.1 mmol/L — ABNORMAL LOW (ref 3.5–5.1)
Sodium: 133 mmol/L — ABNORMAL LOW (ref 135–145)
Total Bilirubin: 7.7 mg/dL — ABNORMAL HIGH (ref 0.3–1.2)
Total Protein: 4.6 g/dL — ABNORMAL LOW (ref 6.5–8.1)

## 2019-08-11 LAB — ECHOCARDIOGRAM LIMITED
Height: 69 in
Weight: 2807.78 oz

## 2019-08-11 LAB — APTT: aPTT: 44 seconds — ABNORMAL HIGH (ref 24–36)

## 2019-08-11 LAB — TRIGLYCERIDES: Triglycerides: 399 mg/dL — ABNORMAL HIGH (ref <150)

## 2019-08-11 LAB — MAGNESIUM
Magnesium: 1.6 mg/dL — ABNORMAL LOW (ref 1.7–2.4)
Magnesium: 1.9 mg/dL (ref 1.7–2.4)

## 2019-08-11 LAB — HEPARIN LEVEL (UNFRACTIONATED): Heparin Unfractionated: <0.1 IU/mL — ABNORMAL LOW (ref 0.30–0.70)

## 2019-08-11 MED ORDER — MAGNESIUM SULFATE 4 GM/100ML IV SOLN
4.0000 g | Freq: Once | INTRAVENOUS | Status: AC
  Administered 2019-08-11: 4 g via INTRAVENOUS
  Filled 2019-08-11: qty 100

## 2019-08-11 MED ORDER — MILRINONE LACTATE IN DEXTROSE 20-5 MG/100ML-% IV SOLN
0.1250 ug/kg/min | INTRAVENOUS | Status: DC
  Administered 2019-08-11 (×2): 0.25 ug/kg/min via INTRAVENOUS
  Administered 2019-08-12: 0.125 ug/kg/min via INTRAVENOUS
  Administered 2019-08-12: 0.25 ug/kg/min via INTRAVENOUS
  Administered 2019-08-13 – 2019-08-30 (×17): 0.125 ug/kg/min via INTRAVENOUS
  Filled 2019-08-11 (×18): qty 100

## 2019-08-11 MED ORDER — POTASSIUM CHLORIDE 10 MEQ/50ML IV SOLN
10.0000 meq | Freq: Once | INTRAVENOUS | Status: AC
  Administered 2019-08-11: 10 meq via INTRAVENOUS

## 2019-08-11 MED ORDER — ALBUMIN HUMAN 5 % IV SOLN
INTRAVENOUS | Status: AC
  Filled 2019-08-11: qty 250

## 2019-08-11 MED ORDER — POTASSIUM CHLORIDE 10 MEQ/50ML IV SOLN
10.0000 meq | INTRAVENOUS | Status: AC
  Administered 2019-08-11 (×4): 10 meq via INTRAVENOUS
  Filled 2019-08-11 (×5): qty 50

## 2019-08-11 MED ORDER — POTASSIUM CHLORIDE 10 MEQ/50ML IV SOLN
10.0000 meq | INTRAVENOUS | Status: AC
  Administered 2019-08-11 (×2): 10 meq via INTRAVENOUS
  Filled 2019-08-11 (×2): qty 50

## 2019-08-11 MED ORDER — SODIUM CHLORIDE 0.9 % IV SOLN
INTRAVENOUS | Status: DC | PRN
  Administered 2019-08-11: 500 mL via INTRAVENOUS
  Administered 2019-09-07 – 2019-09-13 (×2): 250 mL via INTRAVENOUS
  Administered 2019-09-21: 500 mL via INTRAVENOUS
  Administered 2019-09-26: 250 mL via INTRAVENOUS

## 2019-08-11 MED ORDER — POTASSIUM CHLORIDE 10 MEQ/50ML IV SOLN
10.0000 meq | INTRAVENOUS | Status: AC
  Administered 2019-08-11 (×3): 10 meq via INTRAVENOUS
  Filled 2019-08-11: qty 50

## 2019-08-11 MED ORDER — MAGNESIUM SULFATE 2 GM/50ML IV SOLN
2.0000 g | Freq: Once | INTRAVENOUS | Status: AC
  Administered 2019-08-11: 2 g via INTRAVENOUS
  Filled 2019-08-11: qty 50

## 2019-08-11 MED ORDER — POTASSIUM CHLORIDE 10 MEQ/50ML IV SOLN
10.0000 meq | INTRAVENOUS | Status: AC
  Administered 2019-08-11 – 2019-08-12 (×4): 10 meq via INTRAVENOUS
  Filled 2019-08-11: qty 50

## 2019-08-11 MED ORDER — ALBUMIN HUMAN 5 % IV SOLN
12.5000 g | Freq: Once | INTRAVENOUS | Status: AC
  Administered 2019-08-11: 12.5 g via INTRAVENOUS

## 2019-08-11 MED ORDER — PIVOT 1.5 CAL PO LIQD
1000.0000 mL | ORAL | Status: DC
  Filled 2019-08-11: qty 1000

## 2019-08-11 NOTE — CV Procedure (Signed)
Radial arterial line placement.    Emergent consent assumed. The righ wrist was prepped and draped in the routine sterile fashion a single lumen arterial catheter was placed in the right ulnar artery using a modified Seldinger technique. Good blood flow and wave forms. A dressing was placed.     Arvilla Meres, MD  4:53 AM

## 2019-08-11 NOTE — Progress Notes (Signed)
Dr. Maren Beach notified of large amounts of ectopy, specifically runs of VT. Stated to give an additional run of K, decrease epi, increase levo, stop lasix until K >3.8, and give an amio bolus. All orders implemented.  Leanna Battles, RN

## 2019-08-11 NOTE — Progress Notes (Signed)
ANTICOAGULATION CONSULT NOTE - Follow Up Consult  Pharmacy Consult for heparin Indication: ECMO > impella  Labs: Recent Labs    08/09/19 0444 08/09/19 0514 08/10/19 0419 08/10/19 0419 08/10/19 0658 08/10/19 0658 08/10/19 0815 08/10/19 1535 08/10/19 2311 08/11/19 0059 08/11/19 0534 08/11/19 0535  HGB 10.9*   < > 12.5*   < > 12.2*   < >  --   --  11.9*  --  11.2*  --   HCT 31.0*   < > 37.2*   < > 36.0*  --   --   --  35.4*  --  32.9*  --   PLT 97*   < > 95*  --   --   --   --   --  103*  --  100*  --   APTT 45*  --  43*  --   --   --   --   --   --   --  44*  --   HEPARINUNFRC <0.10*  --  <0.10*  --   --   --   --   --   --   --   --  <0.10*  CREATININE 1.17   < > 1.41*  --   --   --    < > 1.41*  --  1.38* 1.42*  --    < > = values in this interval not displayed.    Assessment: 64yo male s/p ECMO, now on Impella with heparin only in purge solution at 5.5 mL/hr (275 units/hr).  CT drainage ~20-30 mL/hr (increased from yesterday). Chest closed yesterday.  Heparin level remains undetectable. Hgb stable at 11, plt 100. No bleeding or complications noted.  Plan: Continue heparin only in purge solution for now. Now that chest closed, will follow up if plans for adding systemic heparin for Impella in future pending clinical pic. Daily heparin level and CBC.  Sherron Monday, PharmD, BCCCP Clinical Pharmacist  Phone: [phone] 08/11/2019 7:13 AM  Please check AMION for all [facility] Pharmacy phone numbers After 10:00 PM, call Main Pharmacy [phone]

## 2019-08-11 NOTE — Progress Notes (Signed)
1 Day Post-Op Procedure(s) (LRB): STERNAL CLOSURE (N/A) TRANSESOPHAGEAL ECHOCARDIOGRAM (TEE) (N/A) APPLICATION OF WOUND VAC (N/A) Subjective: Sedated on vent Hemodynamics stable on Impella support nsr   Objective: Vital signs in last 24 hours: Temp:  [98.2 F (36.8 C)-99.5 F (37.5 C)] 99.3 F (37.4 C) (07/03 1230) Pulse Rate:  [68-99] 99 (07/03 1230) Cardiac Rhythm: Normal sinus rhythm;Atrial fibrillation;Atrial flutter;Other (Comment) (07/03 0800) Resp:  [9-25] 12 (07/03 1230) BP: (78-126)/(54-93) 98/64 (07/03 1230) SpO2:  [91 %-99 %] 96 % (07/03 1230) Arterial Line BP: (71-111)/(51-79) 86/58 (07/03 1230) FiO2 (%):  [40 %] 40 % (07/03 0833) Weight:  [79.6 kg] 79.6 kg (07/03 0500)  Hemodynamic parameters for last 24 hours: PAP: (22-44)/(13-33) 25/16 CVP:  [4 mmHg-11 mmHg] 4 mmHg CO:  [3.9 L/min-4.8 L/min] 4.8 L/min CI:  [2.1 L/min/m2-2.5 L/min/m2] 2.5 L/min/m2  Intake/Output from previous day: 07/02 0701 - 07/03 0700 In: 7443 [I.V.:5990.6; NG/GT:150; IV Piggyback:1204.5] Out: 9270 [Urine:7490; Emesis/NG output:1000; Chest Tube:780] Intake/Output this shift: Total I/O In: 1455.3 [I.V.:1109.7; Other:27.2; NG/GT:100; IV Piggyback:218.3] Out: 1430 [Urine:1150; Stool:200; Chest Tube:80]       Exam    General- sedated and comfortable    Neck- no JVD, no cervical adenopathy palpable, no carotid bruit   Lungs- clear without rales, wheezes   Cor- regular rate and rhythm, no murmur , gallop   Abdomen- soft, non-tender   Extremities - warm, non-tender, minimal edema   Neuro- oriented, appropriate, no focal weakness   Lab Results: Recent Labs    08/10/19 2311 08/11/19 0534  WBC 26.2* 20.5*  HGB 11.9* 11.2*  HCT 35.4* 32.9*  PLT 103* 100*   BMET:  Recent Labs    08/11/19 0059 08/11/19 0534  NA 133* 133*  K 4.6 3.1*  CL 90* 89*  CO2 29 27  GLUCOSE 110* 138*  BUN 16 16  CREATININE 1.38* 1.42*  CALCIUM 8.1* 7.9*    PT/INR: No results for input(s): LABPROT,  INR in the last 72 hours. ABG    Component Value Date/Time   PHART 7.423 08/10/2019 0658   HCO3 31.0 (H) 08/10/2019 0658   TCO2 32 08/10/2019 0658   ACIDBASEDEF 1.0 08/08/2019 1115   O2SAT 76.0 08/10/2019 0815   CBG (last 3)  Recent Labs    08/11/19 0510 08/11/19 0756 08/11/19 1220  GLUCAP 126* 133* 128*    Assessment/Plan: S/P Procedure(s) (LRB): STERNAL CLOSURE (N/A) TRANSESOPHAGEAL ECHOCARDIOGRAM (TEE) (N/A) APPLICATION OF WOUND VAC (N/A) Cont diuresis, tube feeds, antibiotics   LOS: 16 days    Todd [name] 08/11/2019

## 2019-08-11 NOTE — Progress Notes (Signed)
CRITICAL VALUE ALERT  Critical Value:  K 2.6  Date & Time Notied:  08/11/19 1424  Provider Notified: Dr. Vickey Sages aware.  Orders Received/Actions taken: K runs x6.

## 2019-08-11 NOTE — Significant Event (Signed)
Tube feeds restarted at 20 ml/hr this morning at 1000. Patient with cortrak that is not post-pyloric but is near the pylorus. Patient also with OGT. Around 1515 patient had 1 small episode of emesis. OGT had previously been clamped. OGT turned to low wall suction and emptied 850 mls yellow/bile looking emesis with what appears to be tube feed present. TF turned off at this time. Will leave OGT at [facility] for now.  Leanna Battles, RN

## 2019-08-11 NOTE — Progress Notes (Signed)
2000 on 08/11/2019 pt's A-Line no longer with a pulsatile wave form. RT to bedside to assess and catheter malpositioned, A-line had to be removed. MD notified. Blood pressure cuff and Impella tracing on placement signal some what coorelating.   Around 2300 on 08/11/2019 pt's impella positioning waveform noted to be wildly different than patients cuff pressure which was noted to be some what correlating prior. Pt's blood pressure cuff was reading 70-80's systolic over 60's with MAPS in the 70'-80's. Pt with no suction events at this time and no flow issues noted at this time. Also atrial ectopy noted with very frequent PAC's  MD notified and orders received for Albumin with Calcium, Amio gtt to increase to 60, and stat CBC.   Around 0100 pt repositioned in bed and pt had run of NSVT with movement and suction alarms noted on Impella. P level dropped to P4 and was unable to be leveled up after. 250 cc saline bolus given and cardiology notified. Cards to bedside and impella repositioned under echo guidance. Plevel able to be increased to P5 and patients BP stable throughout. Measurement on impella still at 46 cm.   Around 0300 pt noted with suction alarm Plevel dropped to P4 and Albumin given per Dr. Gala Romney. Dr. Teressa Lower came to bedisde and looked at impella under echo guidance and repositioned impella. Impella measuring at 48 cm marking at this time. Placement signal improved and to leave at P4 per MD. A-Line placed for better BP monitoring, and lasix gtt decreased to 4.   RN will continue to monitor patient closely.

## 2019-08-11 NOTE — Progress Notes (Signed)
Echocardiogram 2D Echocardiogram has been performed.  Todd [name] 08/11/2019, 9:40 AM

## 2019-08-11 NOTE — Progress Notes (Signed)
Patient has been hemodynamically unstable due to low K levels. He continues to have very frequent ectopy and runs of VT. This is exacerbated with being turned/repositioned. RN has attempted to turn q2h per protocol when patient's hemodynamics allow. Front of patient bathed. Will continue to replace potassium and will attempt full linen change at 1800 after runs of K are completed.  Leanna Battles, RN

## 2019-08-11 NOTE — Progress Notes (Signed)
CTS PM Rounds  Lots of ventricular arrhythmia due to low K- IV supps in progress Tube feeds on hold for 1.5 L residual volume- may need TPN since Cortrak not helping patient  Blood pressure 94/63, pulse 83, temperature 99.3 F (37.4 C), resp. rate 19, height 5\' 9"  (1.753 m), weight 79.6 kg, SpO2 96 %.

## 2019-08-11 NOTE — Progress Notes (Signed)
NAME:  Todd [name], MRN:  [id number], DOB:  [birth year], LOS: 16 ADMISSION DATE:  07/26/2019, CONSULTATION DATE:  08/03/19 REFERRING MD:  Dorris Fetch, CHIEF COMPLAINT:  ECMO   Brief History   [facility] ECMO for post CABG Vtach arrest  History of present illness   Presented with worsening dyspnea 6/17 c/w CHF exacerbation Cath with severe triple vessel disease Underwent CABG 6/24 Early this AM Vtach arrest Chest opened bedside and cardiac massage initiated as well as multiple cardioversions, amiodarone, bicarb etc Brought to OR and cannulated for [facility] ECMO PCCM consulted to assist with management Comorbidities include DM, heavy smoking, COPD  Past Medical History  Depression Ischemic cardiomyopathy HTN HLD  Significant Hospital Events   6/24 CABG 6/25 VA cannulation 6/28 decannulated and impella placed 6/29 bedside re-exploration; s/p decannulation. Placement of impella device originally at p8 but decreased to p4 2/2 suction events overnight up to p6. Echo completed and repositioned device. Remained on considerable support with 8 epi and 46 norepi vaso 0.05. continued chest tube output with large clots noted. Heparin thru device. Replacement products ongoing. 60% 8 peep 6/30: bedside mediastinal re-exploration and clean out of hematoma with tamponade and worsening hemodynamics. Pt had large volume transfusion. rebolused with amio 2/2 nsvt episode.  Weight up 48 pounds.  Started diuresis, Lasix drip started. 7/1 iatrogenic respiratory alkalosis, vent rate decreased. 7/2: No significant issues overnight remains on pressors and Impella not tolerating tube feeds with high gastric output awaiting core track placement 7/3: started trickle TF   Consults:  CHF, PCCM, TCTS  Procedures:  See below  Significant Diagnostic Tests:  6/22 spirometry with restrictive physiology, preserved FEV1/FVC CXR today with bilateral airspace disease, cannulas in good position, deep left sulcus that was present  yesterday, no clear PTX 6/18 echo: LVEF 20-25%, grade I diastolic dysfunction Micro Data:  COVID and HIV Neg  Antimicrobials:  Vanc6/24>> Cefuroxime 6/24>> 6/25 merrem 6/26->  Interim history/subjective:   NSVT last night.  Impella repositioned by cardiology overnight  Remains in shock, critically ill   Objective   Blood pressure (!) 102/58, pulse 85, temperature 99.1 F (37.3 C), resp. rate (!) 21, height 5\' 9"  (1.753 m), weight 79.6 kg, SpO2 95 %. PAP: (22-44)/(14-33) 27/18 CVP:  [5 mmHg-11 mmHg] 11 mmHg CO:  [3.9 L/min-4.8 L/min] 4.7 L/min CI:  [2.1 L/min/m2-2.5 L/min/m2] 2.5 L/min/m2  Vent Mode: PRVC FiO2 (%):  [40 %] 40 % Set Rate:  [20 bmp] 20 bmp Vt Set:  [500 mL] 500 mL PEEP:  [5 cmH20] 5 cmH20 Plateau Pressure:  [22 cmH20-26 cmH20] 26 cmH20   Intake/Output Summary (Last 24 hours) at 08/11/2019 0737 Last data filed at 08/11/2019 0700 Gross per 24 hour  Intake 7443.04 ml  Output 9270 ml  Net -1826.96 ml   Filed Weights   08/09/19 1345 08/10/19 0730 08/11/19 0500  Weight: (S) 86.7 kg 81.3 kg 79.6 kg    Examination: General: elderly, sedated, on vent  Neuro: grimaces to pain, moves spontaneously  HEENT: pupils reactive to light  Pulmonary: BL vented breaths,  Chest: midline woung vac, and mediastinal drains, right axillary impella  Cardiac: RRR s1 s2  Abdomen soft, nt nd  Extremities cool, no significant edema  GU foley, urine in bag   Resolved Hospital Problem list   N/A  Assessment & Plan:   Severe coronary artery disease w/ severe ICM status post CABG complicated by VT arrest, cardiogenic shock and hemorrhagic shock: EF 20 to 25% with severe LV dysfunction and moderate RV dysfunction -  VA ECMO; now decannulated -Impella device initiated 6/28 -Has required reexploration x3 of the chest as well as massive volume of blood transfusion Plan Amiodarone, possibly need ICD in future  ASA + statin  IV heparin, monitoring protocol per pharmacy  Remains on  impella and vasopressors  Alterations and weaning per advanced heart failure service  vanco + mero continued at this time   Acute hypoxic respiratory failure w/ need for mechanical ventilation  -cxr: sternotomy., basilar atelectasis, The patient's images have been independently reviewed by me.   Plan Adult vent protocol  VAP bundle PAD protocol  - continue diuresis as tolerated   Fluid and electrolyte imbalance: Likely exacerbated by aggressive diuresis Plan Replete lytes as tolerated   DM with hyperglycemia: Glycemic control remains acceptable Plan SSI   Leukocytosis.  About the same Plan Remains on abx  Observe for any sign of worsening infection   Acute blood loss anemia and thrombocytopenia:Hemoglobin remains stable no evidence of active bleeding, platelets acceptable Plan Stable, continue to observe   Elevated Direct Bilirubin - continue to observe, likely mild hemolysis from impella   Nutrition:  Start trickle TF and observe   Best practice:  Diet: ok to resume TF to resume today.  Place core track Pain/Anxiety/Delirium protocol (if indicated): fentanyl, propofol VAP protocol (if indicated): in place DVT prophylaxis:heparin gtt via device GI prophylaxis: PPI Glucose control: see above Mobility: BR Code Status: Full Family Communication: per primary Disposition:  ICU  This patient is critically ill with multiple organ system failure; which, requires frequent high complexity decision making, assessment, support, evaluation, and titration of therapies. This was completed through the application of advanced monitoring technologies and extensive interpretation of multiple databases. During this encounter critical care time was devoted to patient care services described in this note for 34 minutes.  Josephine Igo, DO [facility] 08/11/2019 7:37 AM

## 2019-08-11 NOTE — Progress Notes (Signed)
Patient ID: Todd [name], male   DOB: Apr 14, 1955, 64 y.o.   MRN: [id number]     Advanced Heart Failure Rounding Note  PCP-Cardiologist: No primary care provider on file.   Subjective:    08/02/19 CABG 08/03/19 VF arrest. Chest open at bedside 08/03/19 Back to OR for ECMO cannulation 08/03/19 Washout out for tamponade 08/04/19 Repeat bedside washout for tamponade 08/06/19 To OR for decannulation and Impella 5.5 08/07/19 Underwent emergent bedside chest exploration at bedside for tamponade  08/10/19 Chest closed  Chest closed yesterday. Remains on vent with Impella 5.5. Earlier this am Impella had suction events and turned down to P-4. Impella repositioned under echo guidance and left at P-5.   On epi 4, NE 13, VP 0.04, milrinone 0.125  Sedated on propofol. Remains on lasix gtt at 8. Excellent diuresis. Out another 8L   Impella 5.5 P-4 Good pulsativity Flow 2.9  Swan #s done persoanlly CVP 11 PA 29/18 (23) Thermo 4.7/2.5 SVR ~1100 PAPi 1.0   Objective:   Weight Range: 81.3 kg Body mass index is 26.47 kg/m.   Vital Signs:   Temp:  [98.2 F (36.8 C)-99.9 F (37.7 C)] 99.3 F (37.4 C) (07/03 0300) Pulse Rate:  [57-106] 88 (07/03 0300) Resp:  [17-24] 20 (07/03 0300) BP: (78-123)/(58-93) 100/65 (07/03 0300) SpO2:  [94 %-100 %] 96 % (07/03 0300) Arterial Line BP: (71-111)/(56-79) 71/66 (07/02 1945) FiO2 (%):  [40 %] 40 % (07/02 1928) Weight:  [81.3 kg] 81.3 kg (07/02 0730) Last BM Date: 08/10/19  Weight change: Filed Weights   08/08/19 0500 08/09/19 1345 08/10/19 0730  Weight: 95 kg (S) 86.7 kg 81.3 kg    Intake/Output:   Intake/Output Summary (Last 24 hours) at 08/11/2019 0336 Last data filed at 08/11/2019 0300 Gross per 24 hour  Intake 7663.06 ml  Output 8715 ml  Net -1051.94 ml      Physical Exam    General:  Sedated on propofol on vemt HEENT: normal + ETT Neck: supple. RIJ swan Carotids 2+ bilat; no bruits. No lymphadenopathy or thryomegaly  appreciated. Cor: + wound vacIregular rate & rhythm. +CTs Lungs: coarse Abdomen: soft, nontender, +distended. No hepatosplenomegaly. No bruits or masses. Hypoactive bowel sounds. Extremities: no cyanosis, clubbing, rash, 3-4+ edema + weeping Neuro: sedated on vent   Telemetry   AF <-> sinus 70-80s Personally reviewed   Labs    CBC Recent Labs    08/10/19 0419 08/10/19 0419 08/10/19 0658 08/10/19 2311  WBC 16.2*  --   --  26.2*  HGB 12.5*   < > 12.2* 11.9*  HCT 37.2*   < > 36.0* 35.4*  MCV 88.4  --   --  88.3  PLT 95*  --   --  103*   < > = values in this interval not displayed.   Basic Metabolic Panel Recent Labs    08/09/19 0444 08/09/19 0514 08/10/19 0419 08/10/19 0658 08/10/19 1535 08/11/19 0059  NA 134*   < > 135   < > 134* 133*  K 3.3*   < > 3.3*   < > 3.1* 4.6  CL 96*   < > 92*   < > 89* 90*  CO2 23   < > 28   < > 30 29  GLUCOSE 98   < > 142*   < > 131* 110*  BUN 16   < > 17   < > 16 16  CREATININE 1.17   < > 1.41*   < > 1.41* 1.38*  CALCIUM 6.3*   < > 7.5*   < > 7.4* 8.1*  MG 1.8  --  1.9  --   --   --    < > = values in this interval not displayed.   Liver Function Tests Recent Labs    08/09/19 0444 08/09/19 0444 08/10/19 0419 08/10/19 0815  AST 46*  --  62*  --   ALT 17  --  22  --   ALKPHOS 79  --  108  --   BILITOT 4.8*   < > 6.2* 6.2*  PROT 3.6*  --  4.5*  --   ALBUMIN 1.9*  --  1.9*  --    < > = values in this interval not displayed.   No results for input(s): LIPASE, AMYLASE in the last 72 hours. Cardiac Enzymes No results for input(s): CKTOTAL, CKMB, CKMBINDEX, TROPONINI in the last 72 hours.  BNP: BNP (last 3 results) Recent Labs    07/26/19 1236  BNP 2,568.2*    ProBNP (last 3 results) No results for input(s): PROBNP in the last 8760 hours.   D-Dimer No results for input(s): DDIMER in the last 72 hours. Hemoglobin A1C No results for input(s): HGBA1C in the last 72 hours. Fasting Lipid Panel Recent Labs     08/10/19 0419  TRIG 498*   Thyroid Function Tests No results for input(s): TSH, T4TOTAL, T3FREE, THYROIDAB in the last 72 hours.  Invalid input(s): FREET3  Other results:   Imaging    DG CHEST PORT 1 VIEW  Result Date: 08/11/2019 CLINICAL DATA:  Cardiogenic shock. EXAM: PORTABLE CHEST 1 VIEW COMPARISON:  Radiograph yesterday. FINDINGS: Endotracheal tube tip at the clavicular heads. Enteric tube tip below the diaphragm in the stomach. Weighted enteric tube with tip in the right upper quadrant of the abdomen. Impella from a right upper extremity approach unchanged in position. Right internal jugular Swan-Ganz catheter tip in the region of the right pulmonary outflow tract. Bilateral chest tubes and presumed mediastinal drain in place. Stable heart size and mediastinal contours. Hazy opacity at both lung bases consistent with pleural effusions and atelectasis. Similar pulmonary edema. New median sternotomy and midline skin staples. No visualized pneumothorax. IMPRESSION: 1. Stable support apparatus as described. 2. Unchanged pulmonary edema, bilateral pleural effusions and bibasilar atelectasis. No visualized pneumothorax. 3. New sternotomy wires since yesterday with midline skin staples. Electronically Signed   By: Keith Rake M.D.   On: 08/11/2019 02:02   DG CHEST PORT 1 VIEW  Result Date: 08/10/2019 CLINICAL DATA:  Bypass surgery. Impella device in place. EXAM: PORTABLE CHEST 1 VIEW COMPARISON:  08/09/2019 FINDINGS: The endotracheal tube, NG tube, Swan-Ganz catheter, chest tubes and mediastinal drain tube are stable. The Impella device is in good position, unchanged. External pacer paddles are noted. Persistent mild interstitial edema, bibasilar atelectasis and probable small right effusion. No pneumothorax. IMPRESSION: 1. Stable support apparatus. 2. Stable edema, bibasilar atelectasis and probable small right effusion. Electronically Signed   By: Marijo Sanes M.D.   On: 08/10/2019 07:38    DG Abd Portable 1V  Result Date: 08/10/2019 CLINICAL DATA:  Cortrak placement EXAM: PORTABLE ABDOMEN - 1 VIEW COMPARISON:  08/06/2019 FINDINGS: Interval placement of enteric feeding tube, tip positioned near the pylorus although within the stomach. Esophagogastric tube with tip and side port below the diaphragm. General paucity of bowel gas. No obvious free air on supine radiograph. IMPRESSION: Interval placement of enteric feeding tube, tip positioned near the pylorus although within the stomach. Recommend  advancement if post pyloric positioning is desired. Electronically Signed   By: Eddie Candle M.D.   On: 08/10/2019 16:10     Medications:     Scheduled Medications: . sodium chloride   Intravenous Once  . artificial tears   Both Eyes Q8H  . aspirin  324 mg Per Tube Daily  . chlorhexidine gluconate (MEDLINE KIT)  15 mL Mouth Rinse BID  . Chlorhexidine Gluconate Cloth  6 each Topical Daily  . citalopram  20 mg Per Tube Daily  . docusate  200 mg Per Tube Daily  . feeding supplement (PIVOT 1.5 CAL)  1,000 mL Per Tube Q24H  . feeding supplement (PRO-STAT SUGAR FREE 64)  60 mL Per Tube TID  . fentaNYL (SUBLIMAZE) injection  50 mcg Intravenous Once  . insulin aspart  0-24 Units Subcutaneous Q4H  . mouth rinse  15 mL Mouth Rinse 10 times per day  . multivitamin with minerals  1 tablet Per Tube Daily  . pantoprazole (PROTONIX) IV  40 mg Intravenous QHS  . potassium chloride  20 mEq Oral BID  . rosuvastatin  20 mg Per Tube Daily  . sodium chloride flush  10-40 mL Intracatheter Q12H  . sodium chloride flush  3 mL Intravenous Q12H    Infusions: . sodium chloride 10 mL/hr at 08/08/19 1200  . sodium chloride Stopped (08/08/19 1200)  . sodium chloride Stopped (08/10/19 1621)  . sodium chloride 10 mL/hr at 08/07/19 1100  . amiodarone    . electrolyte-148 100 mL/hr at 08/11/19 0313  . epinephrine 4 mcg/min (08/11/19 0335)  . fentaNYL infusion INTRAVENOUS 250 mcg/hr (08/11/19 0300)  .  furosemide (LASIX) infusion 8 mg/hr (08/11/19 0300)  . impella catheter heparin 50 unit/mL in dextrose 5%    . lactated ringers Stopped (08/08/19 1200)  . lactated ringers 20 mL/hr at 08/10/19 1705  . lactated ringers 20 mL/hr at 08/11/19 0300  . meropenem (MERREM) IV Stopped (08/10/19 2243)  . milrinone 0.125 mcg/kg/min (08/11/19 0300)  . norepinephrine (LEVOPHED) Adult infusion 13 mcg/min (08/11/19 0300)  . propofol (DIPRIVAN) infusion 35 mcg/kg/min (08/11/19 0300)  . vancomycin Stopped (08/10/19 1057)  . vasopressin (PITRESSIN) infusion - *FOR SHOCK* 0.04 Units/min (08/11/19 0300)    PRN Medications: sodium chloride, sodium chloride, dextrose, fentaNYL, lactated ringers, metoprolol tartrate, midazolam, morphine injection, ondansetron (ZOFRAN) IV, sodium chloride flush, sodium chloride flush   Assessment/Plan   1. Cardiogenic shock: Echo with EF 20-25%, moderate RV dysfunction.  Now on [place] ECMO post-VF arrest, cannulated 6/25.  Stable s/p return to OR for mediastinal re-exploration due to bleeding on 6/25.   - Decannulated from [place] ECMO on 08/06/19. Impella 5.5 placed - Underwent emergent chest re-exploration on 6/29 for tamponade and clot removal - Chest closed 7/2 - Hemodynamics improving. Still with significant RV dysfunction. Increase milrinone to 0.25. Wean pressors as tolerated  - Impella 5.5 at P-4. Waveforms ok Device repositioned several times with echo due to suction alarms. Seems to be well positioned now. Keep at P-4. Can give some albumin.  - Remains markedly volume overloaded but improving. Getting suction alarms on vent. Turn lasix gtt down to 4 - Bleeding from CTs much improved. 2. CAD: 3VD - s/p CABG x 4 with LIMA-LAD, SVG-D1, SVG-PDA, and radial-OM1 on 6/24.   - Prolonged VF arrest on 6/25 - Chest closed 7/2 - Continue ASA and Crestor per tube.  - No s/s ischemia currently 3. Cardiac arrest: VF arrest with prolonged code - No further VT/VF.  In.out AF -  Continue IV  amiodarone at 60 mg/hr fo now.   - Eventual evaluation for ICD.   - Keep K > 4.0 Mg > 2.0 4. Acute hypoxemic respiratory failure: In setting of cardiac arrest.  H/o COPD.   - On vent CCM following. - On propofol. Watch TGs - I replaced A-line today 5. Acute blood loss anemia: Now s/p mediastinal re-exploration x 3 - Hgb stable today at 11.9  Continue to follow. CT drainage has slowed 6. Thrombocytopenia:  - PLTs 103k (improved) 7. ID: Empirically covering with meropenem and vancomycin.  - WBC 26K 8. Hypokalemia/Hypomag - K 4.6 today 9. Elevated bilirubin - Likely due to RV failure/[place]cok liver but may have some hemolysis from pump.  - LDH stable 495. Total bili 6.2 DBili 4.1 likely cholestatic/shock liver/RV failure predominantly   CRITICAL CARE Performed by: Glori Bickers  Total critical care time: 60 minutes at bedside  Critical care time was exclusive of separately billable procedures and treating other patients.  Critical care was necessary to treat or prevent imminent or life-threatening deterioration.  Critical care was time spent personally by me on the following activities: development of treatment plan with patient and/or surrogate as well as nursing, discussions with consultants, evaluation of patient's response to treatment, examination of patient, obtaining history from patient or surrogate, ordering and performing treatments and interventions, ordering and review of laboratory studies, ordering and review of radiographic studies, pulse oximetry and re-evaluation of patient's condition.   Length of Stay: [place], MD  08/11/2019, 3:36 AM  Advanced Heart Failure Team Pager [phone] (M-F; [place])  Please contact [facility] for night-coverage after hours (4p -7a ) and weekends on [email or web address]

## 2019-08-12 ENCOUNTER — Inpatient Hospital Stay: Payer: Self-pay

## 2019-08-12 ENCOUNTER — Inpatient Hospital Stay (HOSPITAL_COMMUNITY): Payer: Medicare Other

## 2019-08-12 DIAGNOSIS — L8990 Pressure ulcer of unspecified site, unspecified stage: Secondary | ICD-10-CM | POA: Insufficient documentation

## 2019-08-12 LAB — GLUCOSE, CAPILLARY
Glucose-Capillary: 119 mg/dL — ABNORMAL HIGH (ref 70–99)
Glucose-Capillary: 152 mg/dL — ABNORMAL HIGH (ref 70–99)
Glucose-Capillary: 115 mg/dL — ABNORMAL HIGH (ref 70–99)
Glucose-Capillary: 170 mg/dL — ABNORMAL HIGH (ref 70–99)
Glucose-Capillary: 143 mg/dL — ABNORMAL HIGH (ref 70–99)
Glucose-Capillary: 192 mg/dL — ABNORMAL HIGH (ref 70–99)

## 2019-08-12 LAB — POCT I-STAT 7, (LYTES, BLD GAS, ICA,H+H)
Acid-Base Excess: 9 mmol/L — ABNORMAL HIGH (ref 0.0–2.0)
Acid-Base Excess: 9 mmol/L — ABNORMAL HIGH (ref 0.0–2.0)
Bicarbonate: 32.9 mmol/L — ABNORMAL HIGH (ref 20.0–28.0)
Bicarbonate: 33.5 mmol/L — ABNORMAL HIGH (ref 20.0–28.0)
Calcium, Ion: 1.05 mmol/L — ABNORMAL LOW (ref 1.15–1.40)
Calcium, Ion: 0.99 mmol/L — ABNORMAL LOW (ref 1.15–1.40)
HCT: 32 % — ABNORMAL LOW (ref 39.0–52.0)
HCT: 35 % — ABNORMAL LOW (ref 39.0–52.0)
Hemoglobin: 10.9 g/dL — ABNORMAL LOW (ref 13.0–17.0)
Hemoglobin: 11.9 g/dL — ABNORMAL LOW (ref 13.0–17.0)
O2 Saturation: 94 %
O2 Saturation: 98 %
Patient temperature: 37.4
Patient temperature: 37.4
Potassium: 2.8 mmol/L — ABNORMAL LOW (ref 3.5–5.1)
Potassium: 3.3 mmol/L — ABNORMAL LOW (ref 3.5–5.1)
Sodium: 129 mmol/L — ABNORMAL LOW (ref 135–145)
Sodium: 132 mmol/L — ABNORMAL LOW (ref 135–145)
TCO2: 34 mmol/L — ABNORMAL HIGH (ref 22–32)
TCO2: 35 mmol/L — ABNORMAL HIGH (ref 22–32)
pCO2 arterial: 44.1 mmHg (ref 32.0–48.0)
pCO2 arterial: 47.2 mmHg (ref 32.0–48.0)
pH, Arterial: 7.483 — ABNORMAL HIGH (ref 7.350–7.450)
pH, Arterial: 7.461 — ABNORMAL HIGH (ref 7.350–7.450)
pO2, Arterial: 69 mmHg — ABNORMAL LOW (ref 83.0–108.0)
pO2, Arterial: 111 mmHg — ABNORMAL HIGH (ref 83.0–108.0)

## 2019-08-12 LAB — RENAL FUNCTION PANEL
Albumin: 1.7 g/dL — ABNORMAL LOW (ref 3.5–5.0)
Anion gap: 15 (ref 5–15)
BUN: 16 mg/dL (ref 8–23)
CO2: 25 mmol/L (ref 22–32)
Calcium: 7.3 mg/dL — ABNORMAL LOW (ref 8.9–10.3)
Chloride: 92 mmol/L — ABNORMAL LOW (ref 98–111)
Creatinine, Ser: 1.28 mg/dL — ABNORMAL HIGH (ref 0.61–1.24)
GFR calc Af Amer: >60 mL/min (ref >60)
GFR calc non Af Amer: 58 mL/min — ABNORMAL LOW (ref >60)
Glucose, Bld: 158 mg/dL — ABNORMAL HIGH (ref 70–99)
Phosphorus: 4.1 mg/dL (ref 2.5–4.6)
Potassium: 3.9 mmol/L (ref 3.5–5.1)
Sodium: 132 mmol/L — ABNORMAL LOW (ref 135–145)

## 2019-08-12 LAB — HEPARIN LEVEL (UNFRACTIONATED): Heparin Unfractionated: <0.1 IU/mL — ABNORMAL LOW (ref 0.30–0.70)

## 2019-08-12 LAB — COMPREHENSIVE METABOLIC PANEL
ALT: 33 U/L (ref 0–44)
AST: 108 U/L — ABNORMAL HIGH (ref 15–41)
Albumin: 1.9 g/dL — ABNORMAL LOW (ref 3.5–5.0)
Alkaline Phosphatase: 124 U/L (ref 38–126)
Anion gap: 14 (ref 5–15)
BUN: 16 mg/dL (ref 8–23)
CO2: 27 mmol/L (ref 22–32)
Calcium: 7.6 mg/dL — ABNORMAL LOW (ref 8.9–10.3)
Chloride: 90 mmol/L — ABNORMAL LOW (ref 98–111)
Creatinine, Ser: 1.27 mg/dL — ABNORMAL HIGH (ref 0.61–1.24)
GFR calc Af Amer: >60 mL/min (ref >60)
GFR calc non Af Amer: 59 mL/min — ABNORMAL LOW (ref >60)
Glucose, Bld: 137 mg/dL — ABNORMAL HIGH (ref 70–99)
Potassium: 3.5 mmol/L (ref 3.5–5.1)
Sodium: 131 mmol/L — ABNORMAL LOW (ref 135–145)
Total Bilirubin: 7.9 mg/dL — ABNORMAL HIGH (ref 0.3–1.2)
Total Protein: 4.6 g/dL — ABNORMAL LOW (ref 6.5–8.1)

## 2019-08-12 LAB — CBC
HCT: 34.1 % — ABNORMAL LOW (ref 39.0–52.0)
Hemoglobin: 11.5 g/dL — ABNORMAL LOW (ref 13.0–17.0)
MCH: 29.3 pg (ref 26.0–34.0)
MCHC: 33.7 g/dL (ref 30.0–36.0)
MCV: 87 fL (ref 80.0–100.0)
Platelets: 123 10*3/uL — ABNORMAL LOW (ref 150–400)
RBC: 3.92 MIL/uL — ABNORMAL LOW (ref 4.22–5.81)
RDW: 15.5 % (ref 11.5–15.5)
WBC: 26.8 10*3/uL — ABNORMAL HIGH (ref 4.0–10.5)
nRBC: 0 % (ref 0.0–0.2)

## 2019-08-12 LAB — TYPE AND SCREEN
ABO/RH(D): A POS
Antibody Screen: NEGATIVE

## 2019-08-12 LAB — COOXEMETRY PANEL
Carboxyhemoglobin: 1.2 % (ref 0.5–1.5)
Methemoglobin: 1.2 % (ref 0.0–1.5)
O2 Saturation: 65.7 %
Total hemoglobin: 11 g/dL — ABNORMAL LOW (ref 12.0–16.0)

## 2019-08-12 LAB — LACTATE DEHYDROGENASE: LDH: 469 U/L — ABNORMAL HIGH (ref 98–192)

## 2019-08-12 LAB — MAGNESIUM: Magnesium: 2.2 mg/dL (ref 1.7–2.4)

## 2019-08-12 LAB — APTT: aPTT: 43 seconds — ABNORMAL HIGH (ref 24–36)

## 2019-08-12 LAB — TRIGLYCERIDES: Triglycerides: 411 mg/dL — ABNORMAL HIGH (ref <150)

## 2019-08-12 LAB — PHOSPHORUS: Phosphorus: 3.9 mg/dL (ref 2.5–4.6)

## 2019-08-12 MED ORDER — ZINC CHLORIDE 1 MG/ML IV SOLN
INTRAVENOUS | Status: AC
  Filled 2019-08-12: qty 504

## 2019-08-12 MED ORDER — TRAVASOL 10 % IV SOLN: INTRAVENOUS | Status: DC

## 2019-08-12 MED ORDER — FUROSEMIDE 10 MG/ML IJ SOLN
4.0000 mg/h | INTRAVENOUS | Status: DC
  Administered 2019-08-12 – 2019-08-13 (×2): 4 mg/h via INTRAVENOUS
  Administered 2019-08-15: 8 mg/h via INTRAVENOUS
  Administered 2019-08-16 – 2019-08-17 (×2): 12 mg/h via INTRAVENOUS
  Administered 2019-08-18 – 2019-08-19 (×2): 10 mg/h via INTRAVENOUS
  Administered 2019-08-20: 8 mg/h via INTRAVENOUS
  Administered 2019-08-22: 4 mg/h via INTRAVENOUS
  Filled 2019-08-12 (×3): qty 25
  Filled 2019-08-12: qty 21
  Filled 2019-08-12: qty 25
  Filled 2019-08-12 (×2): qty 21
  Filled 2019-08-12 (×3): qty 25

## 2019-08-12 MED ORDER — POTASSIUM CHLORIDE 10 MEQ/50ML IV SOLN
10.0000 meq | INTRAVENOUS | Status: AC
  Administered 2019-08-12 (×4): 10 meq via INTRAVENOUS
  Filled 2019-08-12 (×4): qty 50

## 2019-08-12 MED ORDER — POTASSIUM CHLORIDE 10 MEQ/50ML IV SOLN
10.0000 meq | INTRAVENOUS | Status: AC
  Administered 2019-08-12 (×3): 10 meq via INTRAVENOUS
  Filled 2019-08-12: qty 50

## 2019-08-12 NOTE — Progress Notes (Signed)
2 Days Post-Op Procedure(s) (LRB): STERNAL CLOSURE (N/A) TRANSESOPHAGEAL ECHOCARDIOGRAM (TEE) (N/A) APPLICATION OF WOUND VAC (N/A) Subjective: Hemodynamics remained stable overnight on stable inotropic support, Impella at P5.  Sinus rhythm i and ventricular arrhythmias appear to have improved  Patient has a significant ileus with gastric residual of 1.2 L from postpyloric feeding tube volume.  Patient will need short-term TNA as nutritional status is worsening, albumin 1.2  White count remains elevated 25,000, chest x-ray fairly clear Patient on meropenem and tracheal aspirate shows scant white cells and few budding yeast\  Praveena on sternal incision which looks good Objective: Vital signs in last 24 hours: Temp:  [99.1 F (37.3 C)-99.5 F (37.5 C)] 99.3 F (37.4 C) (07/04 0915) Pulse Rate:  [75-99] 84 (07/04 0915) Cardiac Rhythm: Normal sinus rhythm;Atrial fibrillation;Atrial flutter (07/04 0800) Resp:  [0-30] 20 (07/04 0915) BP: (87-129)/(54-103) 106/63 (07/04 0915) SpO2:  [92 %-100 %] 99 % (07/04 0915) Arterial Line BP: (75-107)/(47-77) 94/73 (07/04 0915) FiO2 (%):  [40 %-60 %] 50 % (07/04 0801) Weight:  [83.2 kg] 83.2 kg (07/04 0500)  Hemodynamic parameters for last 24 hours: PAP: (22-33)/(13-26) 24/18 CVP:  [4 mmHg-9 mmHg] 9 mmHg CO:  [4.2 L/min-5.5 L/min] 5.5 L/min CI:  [2.2 L/min/m2-2.9 L/min/m2] 2.9 L/min/m2  Intake/Output from previous day: 07/03 0701 - 07/04 0700 In: 6099.8 [I.V.:4260.3; NG/GT:160; IV Piggyback:1554.8] Out: 6073 [Urine:2875; Emesis/NG output:1200; Stool:200; Chest Tube:410] Intake/Output this shift: Total I/O In: 377.7 [I.V.:301.5; Other:5.5; IV Piggyback:70.7] Out: 195 [Urine:75; Emesis/NG output:100; Chest Tube:20]       Exam    General- comfortable on vent with swdation    Neck- no JVD, no cervical adenopathy palpable, no carotid bruit   Lungs- clear without rales, wheezes   Cor- regular rate and rhythm, no murmur , gallop   Abdomen-  soft, non-tender   Extremities - warm, non-tender, minimal edema   Neuro- oriented, appropriate, no focal weakness   Lab Results: Recent Labs    08/11/19 0534 08/12/19 0050 08/12/19 0454 08/12/19 0455  WBC 20.5*  --  26.8*  --   HGB 11.2*   < > 11.5* 11.9*  HCT 32.9*   < > 34.1* 35.0*  PLT 100*  --  123*  --    < > = values in this interval not displayed.   BMET:  Recent Labs    08/11/19 2247 08/12/19 0050 08/12/19 0454 08/12/19 0455  NA 132*   < > 131* 132*  K 3.2*   < > 3.5 3.3*  CL 89*  --  90*  --   CO2 28  --  27  --   GLUCOSE 138*  --  137*  --   BUN 16  --  16  --   CREATININE 1.31*  --  1.27*  --   CALCIUM 7.4*  --  7.6*  --    < > = values in this interval not displayed.    PT/INR: No results for input(s): LABPROT, INR in the last 72 hours. ABG    Component Value Date/Time   PHART 7.461 (H) 08/12/2019 0455   HCO3 33.5 (H) 08/12/2019 0455   TCO2 35 (H) 08/12/2019 0455   ACIDBASEDEF 1.0 08/08/2019 1115   O2SAT 98.0 08/12/2019 0455   CBG (last 3)  Recent Labs    08/11/19 2312 08/12/19 0311 08/12/19 0845  GLUCAP 135* 119* 152*    Assessment/Plan: S/P Procedure(s) (LRB): STERNAL CLOSURE (N/A) TRANSESOPHAGEAL ECHOCARDIOGRAM (TEE) (N/A) APPLICATION OF WOUND VAC (N/A) Start TNA for nutrition Continue  low-dose Lasix drip Leave chest tubes in place today   LOS: 17 days    Todd [name] 08/12/2019

## 2019-08-12 NOTE — Progress Notes (Signed)
Patient ID: Todd [name], male   DOB: [birth year], 64 y.o.   MRN: [id number]     Advanced Heart Failure Rounding Note  PCP-Cardiologist: No primary care provider on file.   Subjective:    08/02/19 CABG 08/03/19 VF arrest. Chest open at bedside 08/03/19 Back to OR for ECMO cannulation 08/03/19 Washout out for tamponade 08/04/19 Repeat bedside washout for tamponade 08/06/19 To OR for decannulation and Impella 5.5 08/07/19 Underwent emergent bedside chest exploration at bedside for tamponade  08/10/19 Chest closed  Remains on vent and Impella 5.5.    On epi 4-> 3, NE 13-> 30, VP 0.04, milrinone 0.25  Was having frequent VT yesterday in setting of low K and suction events. Lasix gtt hep. K supped aggressively. Ectopy improved but still with runs of NSVT.   CT drainage much decreased. Has severe ileus. TPN started this am  Impella 5.5 P-5 Waveforms ok Flow 2.9  Swan #s done persoanlly CVP 9 PA 23/16 Thermo 5.5/2.9 Co-ox 66% PAPi 0.8   Objective:   Weight Range: 83.2 kg Body mass index is 27.09 kg/m.   Vital Signs:   Temp:  [99.1 F (37.3 C)-99.5 F (37.5 C)] 99.3 F (37.4 C) (07/04 1000) Pulse Rate:  [75-99] 88 (07/04 1000) Resp:  [0-30] 20 (07/04 1000) BP: (87-129)/(54-103) 94/64 (07/04 1000) SpO2:  [92 %-100 %] 99 % (07/04 1000) Arterial Line BP: (75-107)/(47-77) 85/68 (07/04 1000) FiO2 (%):  [40 %-60 %] 50 % (07/04 0801) Weight:  [83.2 kg] 83.2 kg (07/04 0500) Last BM Date: 08/12/19  Weight change: Filed Weights   08/10/19 0730 08/11/19 0500 08/12/19 0500  Weight: 81.3 kg 79.6 kg 83.2 kg    Intake/Output:   Intake/Output Summary (Last 24 hours) at 08/12/2019 1102 Last data filed at 08/12/2019 1000 Gross per 24 hour  Intake 5789.97 ml  Output 4040 ml  Net 1749.97 ml      Physical Exam    General:  Sedated on propofol on vemt HEENT: normal + ETT Neck: supple. RIJ swan  Cor: + Praveena Wound vac PMI nondisplaced. Regular rate & rhythm. No rubs, gallops or  murmurs. Lungs: coarse Abdomen: soft, nontender, mildly distended. No hepatosplenomegaly. No bruits or masses. Hypoactivebowel sounds. Extremities: no cyanosis, clubbing, rash, 2-3+ edema (weeping at sites) Neuro: sedated on vent   Telemetry   Sinus 80s. +NSVT and PVCs Personally reviewed   Labs    CBC Recent Labs    08/11/19 0534 08/12/19 0050 08/12/19 0454 08/12/19 0455  WBC 20.5*  --  26.8*  --   HGB 11.2*   < > 11.5* 11.9*  HCT 32.9*   < > 34.1* 35.0*  MCV 88.2  --  87.0  --   PLT 100*  --  123*  --    < > = values in this interval not displayed.   Basic Metabolic Panel Recent Labs    08/11/19 2247 08/12/19 0050 08/12/19 0454 08/12/19 0455  NA 132*   < > 131* 132*  K 3.2*   < > 3.5 3.3*  CL 89*  --  90*  --   CO2 28  --  27  --   GLUCOSE 138*  --  137*  --   BUN 16  --  16  --   CREATININE 1.31*  --  1.27*  --   CALCIUM 7.4*  --  7.6*  --   MG 1.9  --  2.2  --   PHOS  --   --  3.9  --    < > =  values in this interval not displayed.   Liver Function Tests Recent Labs    08/11/19 0534 08/12/19 0454  AST 79* 108*  ALT 26 33  ALKPHOS 110 124  BILITOT 7.7* 7.9*  PROT 4.6* 4.6*  ALBUMIN 2.2* 1.9*   No results for input(s): LIPASE, AMYLASE in the last 72 hours. Cardiac Enzymes No results for input(s): CKTOTAL, CKMB, CKMBINDEX, TROPONINI in the last 72 hours.  BNP: BNP (last 3 results) Recent Labs    07/26/19 1236  BNP 2,568.2*    ProBNP (last 3 results) No results for input(s): PROBNP in the last 8760 hours.   D-Dimer No results for input(s): DDIMER in the last 72 hours. Hemoglobin A1C No results for input(s): HGBA1C in the last 72 hours. Fasting Lipid Panel Recent Labs    08/12/19 0454  TRIG 411*   Thyroid Function Tests No results for input(s): TSH, T4TOTAL, T3FREE, THYROIDAB in the last 72 hours.  Invalid input(s): FREET3  Other results:   Imaging    DG Chest Port 1 View  Result Date: 08/12/2019 CLINICAL DATA:   64 year old male admitted 07/26/2019, status post CABG 08/02/2019, emergent median sternotomy and cardiac massage for arrest 08/03/2019, ECMO initiation 08/03/2019 decannulation 08/06/2019, Impella placement 08/06/2019, mediastinal exploration 08/07/2019, with 4 chest tubes/mediastinal drains and open chest EXAM: PORTABLE CHEST 1 VIEW COMPARISON:  Multiple prior most recent 08/11/2019 FINDINGS: Cardiomediastinal silhouette is unchanged. Surgical changes of median sternotomy, now with intact sternal wires, CABG. Impella device from right axillary approach, unchanged. Endotracheal tube unchanged terminating 7.4 cm above the carina at the level of the clavicular heads. Unchanged right IJ sheath transmits Swan-Ganz catheter which terminates in the right hilar pulmonary arteries. Unchanged pleural/mediastinal drains and epicardial pacing leads. Unchanged gastric tube terminating within the left upper quadrant. Unchanged enteric feeding tube which terminates out of the field of view. Interval removal of the defibrillator pads. Low lung volumes persist, as well as persisting opacity at the right lung base partially opacifying the right hemidiaphragm. Reticular opacities bilateral lungs. No pneumothorax. Blunting of the left costophrenic angle. IMPRESSION: Interval removal of defibrillator pads. Surgical changes of median sternotomy and CABG now with intact sternal wires. Similar appearance of low lung volumes with persisting right basilar opacity representing pleural fluid and atelectasis/consolidation. With the exception of the defibrillator pads, other surgical support apparatus unchanged as above. Electronically Signed   By: Corrie Mckusick D.O.   On: 08/12/2019 08:18     Medications:     Scheduled Medications: . sodium chloride   Intravenous Once  . artificial tears   Both Eyes Q8H  . aspirin  324 mg Per Tube Daily  . chlorhexidine gluconate (MEDLINE KIT)  15 mL Mouth Rinse BID  . Chlorhexidine Gluconate  Cloth  6 each Topical Daily  . citalopram  20 mg Per Tube Daily  . docusate  200 mg Per Tube Daily  . fentaNYL (SUBLIMAZE) injection  50 mcg Intravenous Once  . insulin aspart  0-24 Units Subcutaneous Q4H  . mouth rinse  15 mL Mouth Rinse 10 times per day  . pantoprazole (PROTONIX) IV  40 mg Intravenous QHS  . rosuvastatin  20 mg Per Tube Daily  . sodium chloride flush  10-40 mL Intracatheter Q12H  . sodium chloride flush  3 mL Intravenous Q12H    Infusions: . sodium chloride 10 mL/hr at 08/08/19 1200  . sodium chloride Stopped (08/08/19 1200)  . sodium chloride 20 mL/hr at 08/11/19 1010  . sodium chloride 10 mL/hr at 08/11/19  0700  . sodium chloride 10 mL/hr at 08/12/19 1000  . amiodarone 60 mg/hr (08/12/19 1000)  . epinephrine 3 mcg/min (08/12/19 1000)  . fentaNYL infusion INTRAVENOUS 250 mcg/hr (08/12/19 1000)  . furosemide (LASIX) infusion 4 mg/hr (08/12/19 1101)  . impella catheter heparin 50 unit/mL in dextrose 5%    . lactated ringers 20 mL/hr at 08/10/19 1705  . lactated ringers 20 mL/hr at 08/12/19 1000  . meropenem (MERREM) IV Stopped (08/12/19 0537)  . milrinone 0.25 mcg/kg/min (08/12/19 1000)  . norepinephrine (LEVOPHED) Adult infusion 30 mcg/min (08/12/19 1000)  . propofol (DIPRIVAN) infusion 35 mcg/kg/min (08/12/19 1000)  . TPN ADULT (ION)    . vasopressin (PITRESSIN) infusion - *FOR SHOCK* 0.04 Units/min (08/12/19 1000)    PRN Medications: sodium chloride, sodium chloride, sodium chloride, dextrose, fentaNYL, metoprolol tartrate, midazolam, morphine injection, ondansetron (ZOFRAN) IV, sodium chloride flush, sodium chloride flush   Assessment/Plan   1. Cardiogenic shock: Echo with EF 20-25%, moderate RV dysfunction.  Now on [place] ECMO post-VF arrest, cannulated 6/25.  Stable s/p return to OR for mediastinal re-exploration due to bleeding on 6/25.  Decannulated from [place] ECMO on 08/06/19. Impella 5.5 placed. Underwent emergent chest re-exploration on 6/29 for tamponade  and clot removal. Chest closed 7/2 - Hemodynamics stable. Still with significant RV dysfunction an PAPI < 1.0. I increased milrinone from 0.125 yesterday to 0.25 but increased NE requirements markedly. Will decrease back to 0.125 - Impella 5.5 at P-5 Waveforms ok Device repositioned several times with echo yesterday due to suction alarms. Now well positioned. - Remains markedly volume overloaded (bed weight)  but improving. Skin weeping. K is better now so restart lasix at 4/hrr - Bleeding from CTs much improved. 2. CAD: 3VD - s/p CABG x 4 with LIMA-LAD, SVG-D1, SVG-PDA, and radial-OM1 on 6/24.   - Prolonged VF arrest on 6/25 - Chest closed 7/2 - Continue ASA and Crestor per tube.  - No s/s ischemia currently 3. Cardiac arrest: VF arrest with prolonged code - Had frequent VT/NSVT yesterday in setting of low K. Also in/out AF - Continue IV amiodarone at 60 mg/hr for now.   - Eventual evaluation for ICD or lifevest at d/c - Keep K > 4.0 Mg > 2.0 - More K ordered 4. Acute hypoxemic respiratory failure: In setting of cardiac arrest.  H/o COPD.   - On vent CCM following. - On propofol. Watch TGs - D/w CCM 5. Acute blood loss anemia: Now s/p mediastinal re-exploration x 3 - Hgb stable today at 11.5  Continue to follow. CT drainage has slowed 6. Thrombocytopenia:  - PLTs 123k (improved) 7. ID: Empirically covering with meropenem and vancomycin.  - WBC stable 26K - CCM stopping abx today. Watch for infection 8. Hypokalemia/Hypomag - K 3.5 today - supping aggressively 9. Elevated bilirubin - 6.2 -> 7.7 -> 7.9 (mostly direct = likely cholestatic/shock liver/RV failure predominantly) - May have some hemolysis from pump.  - LDH 495 -> 469  10. Ileus - starting TPN CRITICAL CARE Performed by: Glori Bickers  Total critical care time: 45 minutes at bedside  Critical care time was exclusive of separately billable procedures and treating other patients.  Critical care was necessary to  treat or prevent imminent or life-threatening deterioration.  Critical care was time spent personally by me on the following activities: development of treatment plan with patient and/or surrogate as well as nursing, discussions with consultants, evaluation of patient's response to treatment, examination of patient, obtaining history from patient or surrogate, ordering  and performing treatments and interventions, ordering and review of laboratory studies, ordering and review of radiographic studies, pulse oximetry and re-evaluation of patient's condition.   Length of Stay: 17  Glori Bickers, MD  08/12/2019, 11:02 AM  Advanced Heart Failure Team Pager [phone] (M-F; 7a - 4p)  Please contact [facility] for night-coverage after hours (4p -7a ) and weekends on [email or web address]

## 2019-08-12 NOTE — Progress Notes (Signed)
PHARMACY - TOTAL PARENTERAL NUTRITION CONSULT NOTE  Indication:  Intolerance to EN / Ileus  Patient Measurements: Height: 5\' 9"  (175.3 cm) Weight: 83.2 kg (183 lb 6.8 oz) IBW/kg (Calculated) : 70.7 TPN AdjBW (KG): 81.3 Body mass index is 27.09 kg/m.  Assessment: 67 YOF presented on 07/26/19 with SOB 2/2 CHF exacerbation.  Cath shows severe multi-vessel disease and patient underwent CABG on 08/02/19.  Post-op course complicated by Vtach arrest, bleeding, and pericardia effusion with tamponade. Started on 08/04/19 ECMO on 08/03/19, decannulated and Impella placed 08/06/19, chest closed on 08/10/19.  Patient was on a carb modified diet on admission through 08/01/19, then started on trickle feed on 08/04/19 but was interrupted frequently for OR visits and complications.  Patient had an episode of emesis on 08/11/19 and TF was held again.  Pharmacy consulted for TPN.  Patient has moderate malnutrition per documentation and has been nutritionally deficient.  Glucose / Insulin: hx DM, A1c 9% - CBGs controlled on CVTS SSI Electrolytes: low Na/CL, K 3.3 (on Lasix gtt, 3 runs given, goal >/=4 for ileus), low iCa (CoCa WNL), others WNL Renal: SCr down 1.27, BUN WNL LFTs / TGs: LFTs WNL except AST at 108, tbili 7.9 (no jaundice), TG elevated at 411 Prealbumin / albumin: albumin 1.9 Intake / Output; MIVF: UOP 1.5 ml/kg/hr, NG 10/12/19, stool , chest tube , net +10L GI Imaging: none since TPN Surgeries / Procedures: none since TPN  Central access: right IJ placed 08/02/19 TPN start date: 08/12/19  Nutritional Goals (per RD rec on 7/2): 1820-2184 kCal, 110-146gm AA, >/= 1/8L fluid per day  Current Nutrition:  Propofol at 15.4 ml/hr = 406 kCal  Plan:  Start concentrated TPN at 30 mL/hr at 1800 (goal rate 65 ml/hr) Hold lipid with Propofol on board and elevated TG TPN will provide 76g AA and 147g CHO for a total of 802 kCal (total kCal 1208 with Propofol), meeting ~50% of protein and ~60% of kCal  needs Electrolytes in TPN: Na 168mEq/L, K 67mEq/L (= 34mEq/d), Ca 48mEq/L, Mag 70mEq/L, Phos 82mmol/L, Cl:Ac 2:1 Daily multivitamin, zinc 5mg , chromium 12m and selenium in TPN Continue CVTS SSI Q4H - watch CBGs with TPN initiation Standard TPN labs and nursing care orders (MD rechecking labs in PM)  Finley Chevez D. , PharmD, BCPS, BCCCP 08/12/2019, 10:12 AM

## 2019-08-12 NOTE — Progress Notes (Signed)
NAME:  Author Hatlestad, MRN:  [id number], DOB:  25-Sep-1955, LOS: 17 ADMISSION DATE:  07/26/2019, CONSULTATION DATE:  08/03/19 REFERRING MD:  Dorris Fetch, CHIEF COMPLAINT:  ECMO   Brief History   [facility] ECMO for post CABG Vtach arrest  History of present illness   Presented with worsening dyspnea 6/17 c/w CHF exacerbation Cath with severe triple vessel disease Underwent CABG 6/24 Early this AM Vtach arrest Chest opened bedside and cardiac massage initiated as well as multiple cardioversions, amiodarone, bicarb etc Brought to OR and cannulated for [facility] ECMO PCCM consulted to assist with management Comorbidities include DM, heavy smoking, COPD  Past Medical History  Depression Ischemic cardiomyopathy HTN HLD  Significant Hospital Events   6/24 CABG 6/25 VA cannulation 6/28 decannulated and impella placed 6/29 bedside re-exploration; s/p decannulation. Placement of impella device originally at p8 but decreased to p4 2/2 suction events overnight up to p6. Echo completed and repositioned device. Remained on considerable support with 8 epi and 46 norepi vaso 0.05. continued chest tube output with large clots noted. Heparin thru device. Replacement products ongoing. 60% 8 peep 6/30: bedside mediastinal re-exploration and clean out of hematoma with tamponade and worsening hemodynamics. Pt had large volume transfusion. rebolused with amio 2/2 nsvt episode.  Weight up 48 pounds.  Started diuresis, Lasix drip started. 7/1 iatrogenic respiratory alkalosis, vent rate decreased. 7/2: No significant issues overnight remains on pressors and Impella not tolerating tube feeds with high gastric output awaiting core track placement 7/3: started trickle TF   Consults:  CHF, PCCM, TCTS  Procedures:  See below  Significant Diagnostic Tests:  6/22 spirometry with restrictive physiology, preserved FEV1/FVC CXR today with bilateral airspace disease, cannulas in good position, deep left sulcus that was present  yesterday, no clear PTX 6/18 echo: LVEF 20-25%, grade I diastolic dysfunction  Micro Data:  COVID and HIV Neg  Antimicrobials:  Vanc6/24>> Cefuroxime 6/24>> 6/25 merrem 6/26->  Interim history/subjective:   Still with ectopy overnight.   Objective   Blood pressure (!) 104/59, pulse 79, temperature 99.3 F (37.4 C), resp. rate 20, height 5\' 9"  (1.753 m), weight 83.2 kg, SpO2 100 %. PAP: (22-33)/(13-26) 27/17 CVP:  [4 mmHg-9 mmHg] 9 mmHg CO:  [4.2 L/min-5.5 L/min] 5.5 L/min CI:  [2.2 L/min/m2-2.9 L/min/m2] 2.9 L/min/m2  Vent Mode: PRVC FiO2 (%):  [40 %-60 %] 60 % Set Rate:  [20 bmp] 20 bmp Vt Set:  [500 mL] 500 mL PEEP:  [5 cmH20] 5 cmH20 Plateau Pressure:  [23 cmH20-24 cmH20] 23 cmH20   Intake/Output Summary (Last 24 hours) at 08/12/2019 0742 Last data filed at 08/12/2019 0700 Gross per 24 hour  Intake 6099.82 ml  Output 4685 ml  Net 1414.82 ml   Filed Weights   08/10/19 0730 08/11/19 0500 08/12/19 0500  Weight: 81.3 kg 79.6 kg 83.2 kg    Examination: General: elderly male, intubated on mechanical support  Neuro: grimaces to pain  HEENT: pupils reactive Pulmonary: BL vented breaths  Chest: midline chest incision, wound vac Cardiac: RRR, s1 s2   Abdomen soft, nt nd  Extremities perfused, no edema  GU foley  Resolved Hospital Problem list   N/A  Assessment & Plan:   Severe coronary artery disease w/ severe ICM status post CABG complicated by VT arrest, cardiogenic shock and hemorrhagic shock: EF 20 to 25% with severe LV dysfunction and moderate RV dysfunction -VA ECMO; now decannulated -Impella device initiated 6/28 -Has required reexploration x3 of the chest as well as massive volume of blood transfusion  Plan Amiodarone Asa+stain IV heparin per pharm impella and pressor wean from AHF service  Mero+vanco stop date 7/4  Acute hypoxic respiratory failure w/ need for mechanical ventilation  -cxr: sternotomy., basilar atelectasis, The patient's images have  been independently reviewed by me.   Plan Adult vent protocol continued  VA{ bundle  PAD guidelines for sedation  Continue diuresis as tolerated.  Fluid and electrolyte imbalance: Likely exacerbated by aggressive diuresis Plan Replete lytes  Had lots of ectopy  K>4, Mag >2  DM with hyperglycemia: Glycemic control remains acceptable Plan SSI + CBG  Leukocytosis.  About the same Plan On Abx Would like to stop Plan stop day 7/4  Acute blood loss anemia and thrombocytopenia:Hemoglobin remains stable no evidence of active bleeding, platelets acceptable Plan Observe    Elevated Direct Bilirubin Observe   Nutrition:  Continue trickle TF   Best practice:  Diet: TF -trickle  Pain/Anxiety/Delirium protocol (if indicated): fentanyl, propofol VAP protocol (if indicated): in place DVT prophylaxis: heparin gtt via device GI prophylaxis: PPI Glucose control: see above Mobility: BR Code Status: Full Family Communication: per primary Disposition:  ICU  This patient is critically ill with multiple organ system failure; which, requires frequent high complexity decision making, assessment, support, evaluation, and titration of therapies. This was completed through the application of advanced monitoring technologies and extensive interpretation of multiple databases. During this encounter critical care time was devoted to patient care services described in this note for 32 minutes.  Josephine Igo, DO [facility] 08/12/2019 7:42 AM

## 2019-08-12 NOTE — Progress Notes (Signed)
ANTICOAGULATION CONSULT NOTE - Follow Up Consult  Pharmacy Consult for heparin Indication: ECMO > impella  Labs: Recent Labs    08/10/19 0419 08/10/19 0658 08/10/19 2311 08/11/19 0059 08/11/19 0534 08/11/19 0534 08/11/19 0535 08/11/19 1346 08/11/19 2247 08/12/19 0050 08/12/19 0050 08/12/19 0454 08/12/19 0455  HGB 12.5*   < > 11.9*   < > 11.2*   < >  --   --   --  10.9*   < > 11.5* 11.9*  HCT 37.2*   < > 35.4*   < > 32.9*   < >  --   --   --  32.0*  --  34.1* 35.0*  PLT 95*   < > 103*  --  100*  --   --   --   --   --   --  123*  --   APTT 43*  --   --   --  44*  --   --   --   --   --   --  43*  --   HEPARINUNFRC <0.10*  --   --   --   --   --  <0.10*  --   --   --   --  <0.10*  --   CREATININE 1.41*   < >  --    < > 1.42*   < >  --  1.28* 1.31*  --   --  1.27*  --    < > = values in this interval not displayed.    Assessment: 64yo male s/p ECMO, now on Impella with heparin only in purge solution at 5.3 mL/hr (265 units/hr). Chest closed 7/2. CT drainage decreased from yesterday.   Heparin level remains undetectable, aPTT 43. Hgb stable at 11.9, plt 123. No bleeding or complications noted.  Plan: Continue heparin only in purge solution for now. Now that chest closed, will follow up if plans for adding systemic heparin for Impella in future pending clinical pic. Daily heparin level and CBC.  Sherron Monday, PharmD, BCCCP Clinical Pharmacist  Phone: [phone] 08/12/2019 7:43 AM  Please check AMION for all [facility] phone numbers After 10:00 PM, call Main Pharmacy [phone]

## 2019-08-12 NOTE — Progress Notes (Signed)
CT surgery p.m. Rounds  Patient had stable day with Impella at P5 Ventricular arrhythmias are improved TNA started because of severe ileus and inability for postpyloric feeding tube to guide nutrition We will place PICC line tomorrow and remove Swan and sheath  P.m. labs are reviewed and are satisfactory, BUN 16 creatinine 1.3 potassium 3.9

## 2019-08-13 ENCOUNTER — Inpatient Hospital Stay (HOSPITAL_COMMUNITY): Payer: Medicare Other

## 2019-08-13 ENCOUNTER — Encounter (HOSPITAL_COMMUNITY): Payer: Self-pay | Admitting: Cardiothoracic Surgery

## 2019-08-13 DIAGNOSIS — Z95811 Presence of heart assist device: Secondary | ICD-10-CM

## 2019-08-13 DIAGNOSIS — I501 Left ventricular failure: Secondary | ICD-10-CM

## 2019-08-13 DIAGNOSIS — I472 Ventricular tachycardia: Secondary | ICD-10-CM

## 2019-08-13 LAB — COMPREHENSIVE METABOLIC PANEL
ALT: 35 U/L (ref 0–44)
AST: 110 U/L — ABNORMAL HIGH (ref 15–41)
Albumin: 1.6 g/dL — ABNORMAL LOW (ref 3.5–5.0)
Alkaline Phosphatase: 121 U/L (ref 38–126)
Anion gap: 10 (ref 5–15)
BUN: 16 mg/dL (ref 8–23)
CO2: 30 mmol/L (ref 22–32)
Calcium: 7.2 mg/dL — ABNORMAL LOW (ref 8.9–10.3)
Chloride: 91 mmol/L — ABNORMAL LOW (ref 98–111)
Creatinine, Ser: 1.14 mg/dL (ref 0.61–1.24)
GFR calc Af Amer: >60 mL/min (ref >60)
GFR calc non Af Amer: >60 mL/min (ref >60)
Glucose, Bld: 211 mg/dL — ABNORMAL HIGH (ref 70–99)
Potassium: 3.2 mmol/L — ABNORMAL LOW (ref 3.5–5.1)
Sodium: 131 mmol/L — ABNORMAL LOW (ref 135–145)
Total Bilirubin: 7.1 mg/dL — ABNORMAL HIGH (ref 0.3–1.2)
Total Protein: 4.5 g/dL — ABNORMAL LOW (ref 6.5–8.1)

## 2019-08-13 LAB — DIFFERENTIAL
Abs Immature Granulocytes: 0 10*3/uL (ref 0.00–0.07)
Basophils Absolute: 0 10*3/uL (ref 0.0–0.1)
Basophils Relative: 0 %
Eosinophils Absolute: 1.6 10*3/uL — ABNORMAL HIGH (ref 0.0–0.5)
Eosinophils Relative: 7 %
Lymphocytes Relative: 4 %
Lymphs Abs: 0.9 10*3/uL (ref 0.7–4.0)
Monocytes Absolute: 0.4 10*3/uL (ref 0.1–1.0)
Monocytes Relative: 2 %
Neutro Abs: 19.4 10*3/uL — ABNORMAL HIGH (ref 1.7–7.7)
Neutrophils Relative %: 87 %
nRBC: 0 /100 WBC

## 2019-08-13 LAB — POCT I-STAT 7, (LYTES, BLD GAS, ICA,H+H)
Acid-Base Excess: 13 mmol/L — ABNORMAL HIGH (ref 0.0–2.0)
Bicarbonate: 35.8 mmol/L — ABNORMAL HIGH (ref 20.0–28.0)
Calcium, Ion: 0.99 mmol/L — ABNORMAL LOW (ref 1.15–1.40)
HCT: 35 % — ABNORMAL LOW (ref 39.0–52.0)
Hemoglobin: 11.9 g/dL — ABNORMAL LOW (ref 13.0–17.0)
O2 Saturation: 97 %
Patient temperature: 37.7
Potassium: 3.1 mmol/L — ABNORMAL LOW (ref 3.5–5.1)
Sodium: 133 mmol/L — ABNORMAL LOW (ref 135–145)
TCO2: 37 mmol/L — ABNORMAL HIGH (ref 22–32)
pCO2 arterial: 38.8 mmHg (ref 32.0–48.0)
pH, Arterial: 7.576 — ABNORMAL HIGH (ref 7.350–7.450)
pO2, Arterial: 79 mmHg — ABNORMAL LOW (ref 83.0–108.0)

## 2019-08-13 LAB — CBC
HCT: 32.3 % — ABNORMAL LOW (ref 39.0–52.0)
Hemoglobin: 11 g/dL — ABNORMAL LOW (ref 13.0–17.0)
MCH: 29.6 pg (ref 26.0–34.0)
MCHC: 34.1 g/dL (ref 30.0–36.0)
MCV: 86.8 fL (ref 80.0–100.0)
Platelets: 122 10*3/uL — ABNORMAL LOW (ref 150–400)
RBC: 3.72 MIL/uL — ABNORMAL LOW (ref 4.22–5.81)
RDW: 15.7 % — ABNORMAL HIGH (ref 11.5–15.5)
WBC: 22.3 10*3/uL — ABNORMAL HIGH (ref 4.0–10.5)
nRBC: 0.1 % (ref 0.0–0.2)

## 2019-08-13 LAB — RENAL FUNCTION PANEL
Albumin: 1.6 g/dL — ABNORMAL LOW (ref 3.5–5.0)
Anion gap: 9 (ref 5–15)
BUN: 16 mg/dL (ref 8–23)
CO2: 32 mmol/L (ref 22–32)
Calcium: 7.2 mg/dL — ABNORMAL LOW (ref 8.9–10.3)
Chloride: 90 mmol/L — ABNORMAL LOW (ref 98–111)
Creatinine, Ser: 1.04 mg/dL (ref 0.61–1.24)
GFR calc Af Amer: >60 mL/min (ref >60)
GFR calc non Af Amer: >60 mL/min (ref >60)
Glucose, Bld: 173 mg/dL — ABNORMAL HIGH (ref 70–99)
Phosphorus: 3.4 mg/dL (ref 2.5–4.6)
Potassium: 3 mmol/L — ABNORMAL LOW (ref 3.5–5.1)
Sodium: 131 mmol/L — ABNORMAL LOW (ref 135–145)

## 2019-08-13 LAB — GLUCOSE, CAPILLARY
Glucose-Capillary: 201 mg/dL — ABNORMAL HIGH (ref 70–99)
Glucose-Capillary: 208 mg/dL — ABNORMAL HIGH (ref 70–99)
Glucose-Capillary: 202 mg/dL — ABNORMAL HIGH (ref 70–99)
Glucose-Capillary: 157 mg/dL — ABNORMAL HIGH (ref 70–99)
Glucose-Capillary: 108 mg/dL — ABNORMAL HIGH (ref 70–99)
Glucose-Capillary: 131 mg/dL — ABNORMAL HIGH (ref 70–99)

## 2019-08-13 LAB — CULTURE, RESPIRATORY W GRAM STAIN
Gram Stain: NONE SEEN
Special Requests: NORMAL

## 2019-08-13 LAB — COOXEMETRY PANEL
Carboxyhemoglobin: 1 % (ref 0.5–1.5)
Methemoglobin: 1 % (ref 0.0–1.5)
O2 Saturation: 60.2 %
Total hemoglobin: 11.8 g/dL — ABNORMAL LOW (ref 12.0–16.0)

## 2019-08-13 LAB — BASIC METABOLIC PANEL
Anion gap: 11 (ref 5–15)
BUN: 18 mg/dL (ref 8–23)
CO2: 29 mmol/L (ref 22–32)
Calcium: 7.4 mg/dL — ABNORMAL LOW (ref 8.9–10.3)
Chloride: 91 mmol/L — ABNORMAL LOW (ref 98–111)
Creatinine, Ser: 1.14 mg/dL (ref 0.61–1.24)
GFR calc Af Amer: >60 mL/min (ref >60)
GFR calc non Af Amer: >60 mL/min (ref >60)
Glucose, Bld: 181 mg/dL — ABNORMAL HIGH (ref 70–99)
Potassium: 3.1 mmol/L — ABNORMAL LOW (ref 3.5–5.1)
Sodium: 131 mmol/L — ABNORMAL LOW (ref 135–145)

## 2019-08-13 LAB — MAGNESIUM
Magnesium: 1.7 mg/dL (ref 1.7–2.4)
Magnesium: 1.9 mg/dL (ref 1.7–2.4)

## 2019-08-13 LAB — ECHOCARDIOGRAM LIMITED
Height: 69 in
Weight: 2948.87 oz

## 2019-08-13 LAB — HEPARIN LEVEL (UNFRACTIONATED): Heparin Unfractionated: <0.1 IU/mL — ABNORMAL LOW (ref 0.30–0.70)

## 2019-08-13 LAB — PREALBUMIN: Prealbumin: 6.3 mg/dL — ABNORMAL LOW (ref 18–38)

## 2019-08-13 LAB — TRIGLYCERIDES: Triglycerides: 374 mg/dL — ABNORMAL HIGH (ref <150)

## 2019-08-13 LAB — APTT: aPTT: 39 seconds — ABNORMAL HIGH (ref 24–36)

## 2019-08-13 LAB — PHOSPHORUS: Phosphorus: 3.2 mg/dL (ref 2.5–4.6)

## 2019-08-13 MED ORDER — IPRATROPIUM BROMIDE 0.02 % IN SOLN
0.5000 mg | Freq: Four times a day (QID) | RESPIRATORY_TRACT | Status: DC
  Administered 2019-08-14 – 2019-08-26 (×52): 0.5 mg via RESPIRATORY_TRACT
  Filled 2019-08-13 (×53): qty 2.5

## 2019-08-13 MED ORDER — LEVALBUTEROL HCL 1.25 MG/0.5ML IN NEBU
1.2500 mg | INHALATION_SOLUTION | Freq: Four times a day (QID) | RESPIRATORY_TRACT | Status: DC
  Administered 2019-08-14: 1.25 mg via RESPIRATORY_TRACT
  Filled 2019-08-13: qty 0.5

## 2019-08-13 MED ORDER — MAGNESIUM SULFATE 2 GM/50ML IV SOLN
2.0000 g | Freq: Once | INTRAVENOUS | Status: AC
  Administered 2019-08-13: 2 g via INTRAVENOUS
  Filled 2019-08-13: qty 50

## 2019-08-13 MED ORDER — SODIUM CHLORIDE 0.9% FLUSH
10.0000 mL | Freq: Two times a day (BID) | INTRAVENOUS | Status: DC
  Administered 2019-08-15 – 2019-08-26 (×12): 10 mL

## 2019-08-13 MED ORDER — METOCLOPRAMIDE HCL 5 MG/ML IJ SOLN
10.0000 mg | Freq: Three times a day (TID) | INTRAMUSCULAR | Status: DC
  Administered 2019-08-13 – 2019-08-17 (×12): 10 mg via INTRAVENOUS
  Filled 2019-08-13 (×9): qty 2

## 2019-08-13 MED ORDER — POTASSIUM CHLORIDE 10 MEQ/50ML IV SOLN
10.0000 meq | INTRAVENOUS | Status: AC
  Administered 2019-08-13 (×6): 10 meq via INTRAVENOUS
  Filled 2019-08-13 (×6): qty 50

## 2019-08-13 MED ORDER — IPRATROPIUM BROMIDE 0.02 % IN SOLN: 0.5000 mg | Freq: Four times a day (QID) | RESPIRATORY_TRACT | Status: DC

## 2019-08-13 MED ORDER — SODIUM CHLORIDE 0.9% FLUSH: 10.0000 mL | INTRAVENOUS | Status: DC | PRN

## 2019-08-13 MED ORDER — SODIUM CHLORIDE 0.9 % IV SOLN
1.0000 g | Freq: Three times a day (TID) | INTRAVENOUS | Status: DC
  Administered 2019-08-13 – 2019-08-22 (×27): 1 g via INTRAVENOUS
  Filled 2019-08-13 (×30): qty 1

## 2019-08-13 MED ORDER — HEPARIN (PORCINE) 25000 UT/250ML-% IV SOLN
200.0000 [IU]/h | INTRAVENOUS | Status: DC
  Administered 2019-08-13: 200 [IU]/h via INTRAVENOUS
  Filled 2019-08-13 (×2): qty 250

## 2019-08-13 MED ORDER — ZINC CHLORIDE 1 MG/ML IV SOLN
INTRAVENOUS | Status: AC
  Filled 2019-08-13: qty 504

## 2019-08-13 MED ORDER — POTASSIUM CHLORIDE 10 MEQ/50ML IV SOLN
10.0000 meq | INTRAVENOUS | Status: AC
  Administered 2019-08-13 (×4): 10 meq via INTRAVENOUS
  Filled 2019-08-13: qty 50

## 2019-08-13 MED ORDER — VANCOMYCIN HCL 1500 MG/300ML IV SOLN
1500.0000 mg | INTRAVENOUS | Status: DC
  Administered 2019-08-13 – 2019-08-15 (×3): 1500 mg via INTRAVENOUS
  Filled 2019-08-13 (×4): qty 300

## 2019-08-13 MED ORDER — POTASSIUM CHLORIDE 10 MEQ/50ML IV SOLN
10.0000 meq | INTRAVENOUS | Status: AC
  Administered 2019-08-13 (×3): 10 meq via INTRAVENOUS
  Filled 2019-08-13: qty 50

## 2019-08-13 MED ORDER — INSULIN DETEMIR 100 UNIT/ML ~~LOC~~ SOLN
10.0000 [IU] | Freq: Two times a day (BID) | SUBCUTANEOUS | Status: DC
  Administered 2019-08-13 – 2019-08-16 (×8): 10 [IU] via SUBCUTANEOUS
  Filled 2019-08-13 (×10): qty 0.1

## 2019-08-13 MED ORDER — FUROSEMIDE 10 MG/ML IJ SOLN: 4.0000 mg/h | INTRAVENOUS | Status: DC

## 2019-08-13 MED ORDER — MIDAZOLAM 50MG/50ML (1MG/ML) PREMIX INFUSION
0.5000 mg/h | INTRAVENOUS | Status: DC
  Administered 2019-08-13 – 2019-08-14 (×3): 2 mg/h via INTRAVENOUS
  Filled 2019-08-13 (×3): qty 50

## 2019-08-13 MED ORDER — POTASSIUM CHLORIDE 10 MEQ/50ML IV SOLN
10.0000 meq | INTRAVENOUS | Status: AC
  Administered 2019-08-13 (×3): 10 meq via INTRAVENOUS
  Filled 2019-08-13 (×3): qty 50

## 2019-08-13 MED ORDER — ASPIRIN 300 MG RE SUPP
300.0000 mg | Freq: Every day | RECTAL | Status: DC
  Administered 2019-08-14: 300 mg via RECTAL
  Filled 2019-08-13 (×2): qty 1

## 2019-08-13 NOTE — Progress Notes (Signed)
PHARMACY - TOTAL PARENTERAL NUTRITION CONSULT NOTE  Indication:  Intolerance to EN / Ileus  Patient Measurements: Height: 5\' 9"  (175.3 cm) Weight: 83.6 kg (184 lb 4.9 oz) IBW/kg (Calculated) : 70.7 TPN AdjBW (KG): 81.3 Body mass index is 27.22 kg/m.  Assessment: 30 YOF presented on 07/26/19 with SOB 2/2 CHF exacerbation.  Cath shows severe multi-vessel disease and patient underwent CABG on 08/02/19.  Post-op course complicated by Vtach arrest, bleeding, and pericardia effusion with tamponade. Started on 08/04/19 ECMO on 08/03/19, decannulated and Impella placed 08/06/19, chest closed on 08/10/19.  Patient was on a carb modified diet on admission through 08/01/19, then started on trickle feed on 08/04/19 but was interrupted frequently for OR visits and complications.  Patient had an episode of emesis on 08/11/19 and TF was held again.  Pharmacy consulted for TPN.  Patient has moderate malnutrition per documentation and has been nutritionally deficient.  Patient remains on multiple pressors, intubated and sedated on fentanyl and propofol. CCM ordering Versed 7/5 in attempt to wean off propofol.  7/5 - trial Reglan, then attempting Cortrak placement, planning trach  Glucose / Insulin: hx DM, A1c 9% - CBGs controlled off TPN. CBGs 143-211 on TPN. 20 units SSI utilized / 24 hrs Electrolytes: low Na (increased to 133) / low CL (stable), K 3.1 (on Lasix gtt, 7 K runs already given per MD this AM, goal >/=4 for ileus), low iCa (CoCa WNL), Mag 1.9 (s/p Mag sulfate 2g IV x 1), Phos 4.1>3.2 Renal: SCr down to 1.14, BUN WNL, lasix drip at 4 mg/hr LFTs / TGs: AST mildly elevated 110 - trend up, ALT WNL, tbili down to 7.1 (no jaundice), TG elevated 374 - stable Prealbumin / albumin: Prealbumin 6.3; albumin 1.6 Intake / Output; MIVF: UOP 1.4 ml/kg/hr, NG 9/5, stool 87mL, chest tube 45m, net +11L GI Imaging: none since TPN Surgeries / Procedures: none since TPN  Central access: right IJ placed 08/02/19 TPN start  date: 08/12/19  Nutritional Goals (per RD rec on 7/2): 1820-2184 kCal, 110-146gm AA, >/= 1/8L fluid per day  Current Nutrition:  Propofol at 25 ml/hr - would provide 660 kCal / 24hrs if continued at same rate TPN  Plan:  Continue concentrated TPN at 35 mL/hr at 1800 (goal rate 65 ml/hr) - no change for today with CBGs trend up to >200 on TPN and electrolyte abnormalities Hold lipids with Propofol on board at high rate and elevated TG TPN will provide 76g AA and 147g CHO for a total of 802 kCal (total kCal 1208 with Propofol), meeting ~50% of protein and ~60% of kCal needs Electrolytes in TPN: Na 175mEq/L, increase K to 61mEq/L (44mEq/day), Ca 54mEq/L, Mag 64mEq/L, increase Phos to 39mmol/L, max Cl Add daily multivitamin to TPN. Remove standard trace elements with Tbili >6 and add back zinc 5mg , chromium 16m, and selenium to TPN.  Levemir 10 units Bee BID added per CVTS + continue CVTS SSI Q4H - watch CBGs closely Monitor standard TPN labs, f/u ability to place Cortrak and tolerate TF/wean TPN   , PharmD, BCPS Please check AMION for all [facility] contact numbers Clinical Pharmacist 08/13/2019 9:21 AM

## 2019-08-13 NOTE — Progress Notes (Addendum)
CT surgery p.m. Rounds  Patient had stable day.  Swan and sheath both removed.  New triple-lumen catheter placed.  PICC line placed.  Patient on T PN for nutrition Sinus rhythm Impella wean to P4-we will start low-dose systemic heparin this evening The fingers in his right hand are dusky and A-line has been removed Minimal ventricular tacky arrhythmias today, still replating potassium.  K3.0 this p.m.

## 2019-08-13 NOTE — Progress Notes (Signed)
NAME:  Todd [name], MRN:  [id number], DOB:  1955/03/22, LOS: 18 ADMISSION DATE:  07/26/2019, CONSULTATION DATE:  08/03/19 REFERRING MD:  Dorris Fetch, CHIEF COMPLAINT:  ECMO   Brief History   VA ECMO for post CABG Vtach arrest  History of present illness   Presented with worsening dyspnea 6/17 c/w CHF exacerbation Cath with severe triple vessel disease Underwent CABG 6/24 Early this AM Vtach arrest Chest opened bedside and cardiac massage initiated as well as multiple cardioversions, amiodarone, bicarb etc Brought to OR and cannulated for VA ECMO PCCM consulted to assist with management Comorbidities include DM, heavy smoking, COPD  Past Medical History  Depression Ischemic cardiomyopathy HTN HLD  Significant Hospital Events   6/24 CABG 6/25 VA cannulation 6/28 decannulated and impella placed 6/29 bedside re-exploration; s/p decannulation. Placement of impella device originally at p8 but decreased to p4 2/2 suction events overnight up to p6. Echo completed and repositioned device. Remained on considerable support with 8 epi and 46 norepi vaso 0.05. continued chest tube output with large clots noted. Heparin thru device. Replacement products ongoing. 60% 8 peep 6/30: bedside mediastinal re-exploration and clean out of hematoma with tamponade and worsening hemodynamics. Pt had large volume transfusion. rebolused with amio 2/2 nsvt episode.  Weight up 48 pounds.  Started diuresis, Lasix drip started. 7/1 iatrogenic respiratory alkalosis, vent rate decreased. 7/2: No significant issues overnight remains on pressors and Impella not tolerating tube feeds with high gastric output awaiting core track placement 7/3: started trickle TF   Consults:  CHF, PCCM, TCTS  Procedures:  See below  Significant Diagnostic Tests:  6/22 spirometry with restrictive physiology, preserved FEV1/FVC CXR today with bilateral airspace disease, cannulas in good position, deep left sulcus that was present  yesterday, no clear PTX 6/18 echo: LVEF 20-25%, grade I diastolic dysfunction  Micro Data:  COVID and HIV Neg  Antimicrobials:  Vanc6/24>> Cefuroxime 6/24>> 6/25 merrem 6/26->  Interim history/subjective:   More tachyapenic on vent. Again with episodes of pulseless VT   Objective   Blood pressure 120/61, pulse 87, temperature 100 F (37.8 C), resp. rate (!) 27, height 5\' 9"  (1.753 m), weight 83.6 kg, SpO2 97 %. PAP: (22-30)/(14-21) 27/19 CVP:  [7 mmHg-11 mmHg] 8 mmHg CO:  [4.3 L/min-5.5 L/min] 5 L/min CI:  [2.3 L/min/m2-2.9 L/min/m2] 2.6 L/min/m2  Vent Mode: PRVC FiO2 (%):  [40 %-60 %] 40 % Set Rate:  [20 bmp] 20 bmp Vt Set:  [500 mL] 500 mL PEEP:  [5 cmH20] 5 cmH20 Plateau Pressure:  [20 cmH20-23 cmH20] 23 cmH20   Intake/Output Summary (Last 24 hours) at 08/13/2019 10/14/2019 Last data filed at 08/13/2019 0700 Gross per 24 hour  Intake 5055.46 ml  Output 3815 ml  Net 1240.46 ml   Filed Weights   08/11/19 0500 08/12/19 0500 08/13/19 0500  Weight: 79.6 kg 83.2 kg 83.6 kg    Examination: General: elderly male, on full vent support  Neuro: pupils reactive, grimaces to pain, sedated on vent  HEENT: pupils reactive Pulmonary: BL vented breaths  Chest: midline chest, wound vac  Cardiac: RRR, s1 s2  Abdomen soft, nt nd  Extremities perfused, no edema  GU foley   Resolved Hospital Problem list   N/A  Assessment & Plan:   Severe coronary artery disease w/ severe ICM status post CABG complicated by VT arrest, cardiogenic shock and hemorrhagic shock: EF 20 to 25% with severe LV dysfunction and moderate RV dysfunction Recurrent pulseless VT with awaking off propofol  -VA ECMO; now decannulated -  Impella device initiated 6/28 -Has required reexploration x3 of the chest as well as massive volume of blood transfusion Plan Amiodarone  Asa+statin IV heparin impella and pressor wean per AHF service  Mero+vanco course complete, 7/4 stop date   Sedation needs - would like to  decrease propofol  - start low dose versed  - continue fent  - discussed potential need for trach with team this AM   Acute hypoxic respiratory failure w/ need for mechanical ventilation  -cxr: sternotomy., basilar atelectasis, The patient's images have been independently reviewed by me.   - resp alkalosis, metabolic alkalosis, mixed Plan Dropped TV to 6cc this AM Continuing diuresis, may need to consider diamox to help balance   Fluid and electrolyte imbalance: Likely exacerbated by aggressive diuresis Plan Replete lytes as needed Follow closely Discussed with cardiology   DM with hyperglycemia: Glycemic control remains acceptable Plan SSI + CBG   Leukocytosis.  About the same Plan On Abx Would like to stop Plan stop day 7/4  Acute blood loss anemia and thrombocytopenia:Hemoglobin remains stable no evidence of active bleeding, platelets acceptable Plan Observe    Elevated Direct Bilirubin Observe   Nutrition:  TNA started by TCTS I would start TF if tolerated  Attempted dobhoff advancement   Best practice:  Diet: TF -trickle  Pain/Anxiety/Delirium protocol (if indicated): fentanyl, propofol VAP protocol (if indicated): in place DVT prophylaxis: heparin gtt via device GI prophylaxis: PPI Glucose control: see above Mobility: BR Code Status: Full Family Communication: per primary Disposition:  ICU  This patient is critically ill with multiple organ system failure; which, requires frequent high complexity decision making, assessment, support, evaluation, and titration of therapies. This was completed through the application of advanced monitoring technologies and extensive interpretation of multiple databases. During this encounter critical care time was devoted to patient care services described in this note for 32 minutes.  Josephine Igo, DO [facility] 08/13/2019 7:33 AM

## 2019-08-13 NOTE — Progress Notes (Signed)
Pharmacy Antibiotic Note  Todd [name] is a 64 y.o. male admitted on 07/26/2019 s/p CABG c/b cardiac arrest requiring ECMO cannulation> de-cannulated to impella 5.5. Pt started on vancomycin and meropenem with open chest - chest closed 7/2  - last dose vanc 7/2, last dose meropenem 7/4. Fever spike overnight up to 100.4, wbc elevated 31, Cr improved 1.1 continued pressor support New blood cultures and respiratory cultures drawn  Plan: Restart vancomycin at 1500 mg IV q 24hr  Restart meropenem 1gm IV q8h Repeat vancomycin levels at steady state   Height: 5\' 9"  (175.3 cm) Weight: 83.6 kg (184 lb 4.9 oz) IBW/kg (Calculated) : 70.7  Temp (24hrs), Avg:99.5 F (37.5 C), Min:99.1 F (37.3 C), Max:100 F (37.8 C)  Recent Labs  Lab 08/08/19 2054 08/09/19 0013 08/09/19 1228 08/09/19 1621 08/10/19 0419 08/10/19 0815 08/10/19 2311 08/11/19 0059 08/11/19 0534 08/11/19 1346 08/11/19 2247 08/12/19 0454 08/12/19 1617 08/12/19 2325 08/13/19 0431  WBC  --    < >  --   --  16.2*  --  26.2*  --  20.5*  --   --  26.8*  --   --  22.3*  CREATININE  --    < >  --    < > 1.41*   < >  --    < > 1.42*   < > 1.31* 1.27* 1.28* 1.14 1.14  VANCOTROUGH 33*  --   --   --   --   --   --   --   --   --   --   --   --   --   --   VANCORANDOM  --   --  22  --   --   --   --   --   --   --   --   --   --   --   --    < > = values in this interval not displayed.    Estimated Creatinine Clearance: 64.6 mL/min (by C-G formula based on SCr of 1.14 mg/dL).    Allergies  Allergen Reactions  . Empagliflozin     Other reaction(s): diarrhea  . Other     Other reaction(s): Unknown  . Simvastatin     Other reaction(s): diarrhea  . Sitagliptin     Other reaction(s): diarrhea/abd pain    Antimicrobials this admission: Cefuroxime 6/23 > 6/25 Vancomycin 6/24 > 6/24; resume 6/26 > 7/2 Meropenem 6/26 >> 7/4     7/26 Pharm.D. CPP, BCPS Clinical Pharmacist [phone] 08/13/2019 12:49 PM      [facility]  pharmacy phone numbers are listed on [email or web address]

## 2019-08-13 NOTE — Op Note (Signed)
[name], [name] MEDICAL RECORD [id number] ACCOUNT [id number] DATE OF BIRTH:[birth year] FACILITY: [facility] LOCATION: [facility] PHYSICIAN:Nancee Brownrigg VAN TRIGT III, MD  OPERATIVE REPORT  DATE OF PROCEDURE:  08/13/2019  OPERATION:  Placement of left subclavian triple lumen catheter.  SURGEON:  Kerin Perna, MD  ANESTHESIA:  Local 1% lidocaine.  PREOPERATIVE DIAGNOSIS:  Multisystem dysfunction after myocardial infarction-cardiogenic shock.  DESCRIPTION OF PROCEDURE:  Informed consent for the procedure was discussed and obtained from the patient's wife, including a discussion of the indications and benefits and the potential risks.  The patient was positioned in bed in the ICU.  He was given  a dose of IV Versed as he was on the ventilator.  The left chest was prepped and draped as a sterile field.  A proper time-out was performed.  One percent lidocaine was infiltrated in the distal third of the skin over the clavicle.  Using the Seldinger  technique, the left subclavian vein was cannulated and a guidewire passed easily.  The guidewire was stabilized as the needle was withdrawn and the dilator was passed over the needle.  Over the needle, the triple lumen catheter passed easily.  It was  flushed with sterile saline, secured to the skin and sterile dressing was applied.  Chest x-ray is pending.  No known complications.  VN/NUANCE  D:08/13/2019 T:08/13/2019 JOB:[id number]

## 2019-08-13 NOTE — Progress Notes (Signed)
  Echocardiogram 2D Echocardiogram has been performed.  Todd [name] 08/13/2019, 8:53 AM

## 2019-08-13 NOTE — Progress Notes (Addendum)
Patient ID: Todd [name], male   DOB: 16-Nov-1955, 64 y.o.   MRN: [id number]     Advanced Heart Failure Rounding Note  PCP-Cardiologist: No primary care provider on file.   Subjective:    08/02/19 CABG 08/03/19 VF arrest. Chest open at bedside 08/03/19 Back to OR for ECMO cannulation 08/03/19 Washout out for tamponade 08/04/19 Repeat bedside washout for tamponade 08/06/19 To OR for decannulation and Impella 5.5 08/07/19 Underwent emergent bedside chest exploration at bedside for tamponade  08/10/19 Chest closed  Remains on vent and Impella 5.5.    Epinephrine 3, milrinone 0.125, NE 24, vasopressin 0.04.  MAP in 80s this morning.  Per nursing, have not been able to wean pressors.  No heparin gtt, only in purge.   Still with NSVT runs, loses pulsatility.  K has been very difficult to replace.  Had 2 suction alarms with Impella overnight, decreased to P4 but now back up to P5.   Has been on Lasix gtt 4 mg/hr, I/Os positive with significant input, but good UOP.   CT drainage much decreased. Has severe ileus, unable to get Cortrak post-pyloric.  Getting TPN now.   Impella 5.5 P-5 Waveforms ok Flow 3.1  Swan  CVP 9 PA 27/18 Thermo CI 2.6 PAPi 1   Objective:   Weight Range: 83.6 kg Body mass index is 27.22 kg/m.   Vital Signs:   Temp:  [99.1 F (37.3 C)-100 F (37.8 C)] 100 F (37.8 C) (07/05 0700) Pulse Rate:  [69-93] 81 (07/05 0700) Resp:  [14-27] 26 (07/05 0700) BP: (85-134)/(59-105) 113/61 (07/05 0700) SpO2:  [95 %-100 %] 97 % (07/05 0700) Arterial Line BP: (81-122)/(58-77) 110/64 (07/05 0700) FiO2 (%):  [40 %-50 %] 40 % (07/05 0341) Weight:  [83.6 kg] 83.6 kg (07/05 0500) Last BM Date: 08/12/19  Weight change: Filed Weights   08/11/19 0500 08/12/19 0500 08/13/19 0500  Weight: 79.6 kg 83.2 kg 83.6 kg    Intake/Output:   Intake/Output Summary (Last 24 hours) at 08/13/2019 0816 Last data filed at 08/13/2019 0700 Gross per 24 hour  Intake 4869.73 ml  Output 3815 ml    Net 1054.73 ml      Physical Exam    General: Sedated on vent Neck: JVP 10 cm, no thyromegaly or thyroid nodule.  Lungs: Decreased at bases. CV: Impella sounds.  Heart regular S1/S2, no S3/S4, no murmur.  2+ edema to knees.  N Abdomen: Soft, nontender, no hepatosplenomegaly, no distention.  Skin: Intact without lesions or rashes.  Neurologic: Sedated on vent. Extremities: No clubbing or cyanosis.  HEENT: Normal.    Telemetry   NSR 60s. +NSVT and PVCs Personally reviewed   Labs    CBC Recent Labs    08/12/19 0454 08/12/19 0455 08/13/19 0431 08/13/19 0659  WBC 26.8*  --  22.3*  --   NEUTROABS  --   --  19.4*  --   HGB 11.5*   < > 11.0* 11.9*  HCT 34.1*   < > 32.3* 35.0*  MCV 87.0  --  86.8  --   PLT 123*  --  122*  --    < > = values in this interval not displayed.   Basic Metabolic Panel Recent Labs    08/12/19 1617 08/12/19 1617 08/12/19 2325 08/12/19 2325 08/13/19 0431 08/13/19 0659  NA 132*   < > 131*   < > 131* 133*  K 3.9   < > 3.1*   < > 3.2* 3.1*  CL 92*   < >  91*  --  91*  --   CO2 25   < > 29  --  30  --   GLUCOSE 158*   < > 181*  --  211*  --   BUN 16   < > 18  --  16  --   CREATININE 1.28*   < > 1.14  --  1.14  --   CALCIUM 7.3*   < > 7.4*  --  7.2*  --   MG  --   --  1.7  --  1.9  --   PHOS 4.1  --   --   --  3.2  --    < > = values in this interval not displayed.   Liver Function Tests Recent Labs    08/12/19 0454 08/12/19 0454 08/12/19 1617 08/13/19 0431  AST 108*  --   --  110*  ALT 33  --   --  35  ALKPHOS 124  --   --  121  BILITOT 7.9*  --   --  7.1*  PROT 4.6*  --   --  4.5*  ALBUMIN 1.9*   < > 1.7* 1.6*   < > = values in this interval not displayed.   No results for input(s): LIPASE, AMYLASE in the last 72 hours. Cardiac Enzymes No results for input(s): CKTOTAL, CKMB, CKMBINDEX, TROPONINI in the last 72 hours.  BNP: BNP (last 3 results) Recent Labs    07/26/19 1236  BNP 2,568.2*    ProBNP (last 3 results) No  results for input(s): PROBNP in the last 8760 hours.   D-Dimer No results for input(s): DDIMER in the last 72 hours. Hemoglobin A1C No results for input(s): HGBA1C in the last 72 hours. Fasting Lipid Panel Recent Labs    08/13/19 0431  TRIG 374*   Thyroid Function Tests No results for input(s): TSH, T4TOTAL, T3FREE, THYROIDAB in the last 72 hours.  Invalid input(s): FREET3  Other results:   Imaging    [place] EKG SITE RITE  Result Date: 08/12/2019 If Site Rite image not attached, placement could not be confirmed due to current cardiac rhythm.    Medications:     Scheduled Medications: . sodium chloride   Intravenous Once  . artificial tears   Both Eyes Q8H  . aspirin  324 mg Per Tube Daily  . aspirin  300 mg Rectal Daily  . chlorhexidine gluconate (MEDLINE KIT)  15 mL Mouth Rinse BID  . Chlorhexidine Gluconate Cloth  6 each Topical Daily  . docusate  200 mg Per Tube Daily  . fentaNYL (SUBLIMAZE) injection  50 mcg Intravenous Once  . insulin aspart  0-24 Units Subcutaneous Q4H  . mouth rinse  15 mL Mouth Rinse 10 times per day  . pantoprazole (PROTONIX) IV  40 mg Intravenous QHS  . sodium chloride flush  10-40 mL Intracatheter Q12H  . sodium chloride flush  3 mL Intravenous Q12H    Infusions: . sodium chloride 10 mL/hr at 08/08/19 1200  . sodium chloride Stopped (08/08/19 1200)  . sodium chloride 20 mL/hr at 08/11/19 1010  . sodium chloride 10 mL/hr at 08/11/19 0700  . sodium chloride Stopped (08/13/19 8099)  . amiodarone 60 mg/hr (08/13/19 0739)  . epinephrine 3 mcg/min (08/13/19 0700)  . fentaNYL infusion INTRAVENOUS 275 mcg/hr (08/13/19 0748)  . furosemide (LASIX) infusion 4 mg/hr (08/13/19 0700)  . furosemide (LASIX) infusion    . impella catheter heparin 50 unit/mL in dextrose 5%    .  lactated ringers 20 mL/hr at 08/10/19 1705  . lactated ringers 20 mL/hr at 08/13/19 0700  . milrinone 0.125 mcg/kg/min (08/13/19 0700)  . norepinephrine (LEVOPHED) Adult  infusion 24 mcg/min (08/13/19 0736)  . potassium chloride 10 mEq (08/13/19 0734)  . potassium chloride    . propofol (DIPRIVAN) infusion 50 mcg/kg/min (08/13/19 0700)  . TPN ADULT (ION) 35 mL/hr at 08/13/19 0700  . vasopressin (PITRESSIN) infusion - *FOR SHOCK* 0.04 Units/min (08/13/19 0700)    PRN Medications: sodium chloride, sodium chloride, sodium chloride, dextrose, fentaNYL, metoprolol tartrate, midazolam, morphine injection, ondansetron (ZOFRAN) IV, sodium chloride flush, sodium chloride flush   Assessment/Plan   1. Cardiogenic shock: Echo with EF 20-25%, moderate RV dysfunction.  VA ECMO post-VF arrest, cannulated 6/25.  Stable s/p return to OR for mediastinal re-exploration due to bleeding on 6/25.  Decannulated from [place] ECMO on 08/06/19. Impella 5.5 placed. Underwent emergent chest re-exploration on 6/29 for tamponade and clot removal. Chest closed 7/2.  This morning, still on significant pressor/inotrope support with epinephrine 3, NE, 24, milrinone 0.125, vasopressin 0.04. Have not been able to wean.  Swan with good cardiac output, PAPI remains low around 1 suggesting significant RV dysfunction.  Impella 5.5 at P5, have not been able to increase with NSVT. Volume overloaded with weight up but difficult to maintain K and having VT.  - Continue current pressors/inotropes, wean as able.  - Remove Luiz Blare (has been in place for a week) and place CVL and PICC to allow adequate access.  - Check Impella position via echo this morning => stable position 5.0 cm. See below, aim to start low dose heparin gtt with stable hgb and minimal CT drainage.  - Difficult situation with volume overload but unable to maintain K and having VT => aggressively replete K IV and will restart Lasix 4 mg/hr (good response yesterday).  Will try to advance Cortrak post-pyloric today with help of Reglan IV dose x 1, can then give additional K through tube.  If unable to place Cortrak in adequate position will consider CVVH  to allow UF and maintenance of K.  - Bleeding from CTs much improved. 2. 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  Prolonged VF arrest on 6/25. Chest closed 7/2.  - Continue ASA pr and Crestor will be given when we have post-pyloric access.  3. Cardiac arrest: VF arrest with prolonged code. Still with episodes of VT/NSVT in setting of low K (difficult to effectively replace).  - Aggressive K replacement.  See above regarding re-attempting post-pyloric tube placement to allow K per tube also.  If not, will consider CVVH to maintain K while removing fluid.  - Continue IV amiodarone at 60 mg/hr for now.   - Eventual evaluation for ICD or lifevest at d/c - Keep K > 4.0 Mg > 2.0 4. Acute hypoxemic respiratory failure: In setting of cardiac arrest.  H/o COPD.  Right basilar atelectasis/effusion.  - On vent, CCM following => plan for tracheostomy Tuesday or Wednesday.  - On propofol => will transition to Versed to help with BP.  5. Acute blood loss anemia: Now s/p mediastinal re-exploration x 3. Hgb stable today.  CT drainage has slowed.   - Discussed low dose heparin gtt with pharmacy today, will aim to start today after CVL placed. 6. Thrombocytopenia: Stable at 122.  7. ID: Empirically covered with meropenem and vancomycin => stopped 7/4.  WBCs 26 => 22.  - Will reculture blood/sputum today with  Tm 100.  - Remove old [place].  8. Elevated bilirubin: 6.2 -> 7.7 -> 7.9 -> 7.1 (mostly direct = likely cholestatic/shock liver/RV failure predominantly).  May have some hemolysis from pump but LDH stable 495 -> 469 9. Ileus: Getting TPN. - As above, will again try to get a tube post-pyloric today.  10. Atrial fibrillation: Paroxysmal.  In NSR today.  - Continue amiodarone gtt.  - Aim to start heparin gtt at low dose as above.   CRITICAL CARE Performed by: Loralie Champagne  Total critical care time: 45 minutes at bedside  Critical care time was exclusive of separately  billable procedures and treating other patients.  Critical care was necessary to treat or prevent imminent or life-threatening deterioration.  Critical care was time spent personally by me on the following activities: development of treatment plan with patient and/or surrogate as well as nursing, discussions with consultants, evaluation of patient's response to treatment, examination of patient, obtaining history from patient or surrogate, ordering and performing treatments and interventions, ordering and review of laboratory studies, ordering and review of radiographic studies, pulse oximetry and re-evaluation of patient's condition.   Length of Stay: 48  Loralie Champagne, MD  08/13/2019, 8:16 AM  Advanced Heart Failure Team Pager [phone] (M-F; 7a - 4p)  Please contact [facility] for night-coverage after hours (4p -7a ) and weekends on [email or web address]

## 2019-08-13 NOTE — Progress Notes (Signed)
3 Days Post-Op Procedure(s) (LRB): STERNAL CLOSURE (N/A) TRANSESOPHAGEAL ECHOCARDIOGRAM (TEE) (N/A) APPLICATION OF WOUND VAC (N/A) Subjective: Sedated on ventilator Only short runs of V. tach overnight TNA started yesterday for failure of core track tube secondary to ileus Hemodynamics stable on low-dose epi milrinone and norepinephrine Plan on removing PA catheter today which has been in place since original CABG.  New triple-lumen catheter placed in left subclavian vein, PICC line will be placed in the periphery.  Objective: Vital signs in last 24 hours: Temp:  [99.1 F (37.3 C)-100 F (37.8 C)] 99.9 F (37.7 C) (07/05 1035) Pulse Rate:  [69-90] 82 (07/05 1035) Cardiac Rhythm: Normal sinus rhythm (07/05 0800) Resp:  [13-27] 15 (07/05 1035) BP: (85-134)/(59-105) 101/68 (07/05 1030) SpO2:  [92 %-100 %] 96 % (07/05 1035) Arterial Line BP: (71-122)/(54-82) 75/55 (07/05 1035) FiO2 (%):  [40 %-50 %] 40 % (07/05 0746) Weight:  [83.6 kg] 83.6 kg (07/05 0500)  Hemodynamic parameters for last 24 hours: PAP: (20-30)/(12-21) 24/17 CVP:  [7 mmHg-11 mmHg] 8 mmHg CO:  [4.3 L/min-5.4 L/min] 4.5 L/min CI:  [2.3 L/min/m2-2.8 L/min/m2] 2.4 L/min/m2  Intake/Output from previous day: 07/04 0701 - 07/05 0700 In: 5055.5 [I.V.:4112.2; NG/GT:45; IV Piggyback:780.5] Out: 3815 [Urine:3145; Emesis/NG output:300; Stool:50; Chest Tube:320] Intake/Output this shift: Total I/O In: 716.4 [I.V.:519; Other:11.2; IV Piggyback:186.2] Out: 375 [Urine:335; Chest Tube:40]  Exam  Sedated on vent Minimal edema Lungs with coarse breath sounds Impella site without bleeding, mild hematoma  Lab Results: Recent Labs    08/12/19 0454 08/12/19 0455 08/13/19 0431 08/13/19 0659  WBC 26.8*  --  22.3*  --   HGB 11.5*   < > 11.0* 11.9*  HCT 34.1*   < > 32.3* 35.0*  PLT 123*  --  122*  --    < > = values in this interval not displayed.   BMET:  Recent Labs    08/12/19 2325 08/12/19 2325 08/13/19 0431  08/13/19 0659  NA 131*   < > 131* 133*  K 3.1*   < > 3.2* 3.1*  CL 91*  --  91*  --   CO2 29  --  30  --   GLUCOSE 181*  --  211*  --   BUN 18  --  16  --   CREATININE 1.14  --  1.14  --   CALCIUM 7.4*  --  7.2*  --    < > = values in this interval not displayed.    PT/INR: No results for input(s): LABPROT, INR in the last 72 hours. ABG    Component Value Date/Time   PHART 7.576 (H) 08/13/2019 0659   HCO3 35.8 (H) 08/13/2019 0659   TCO2 37 (H) 08/13/2019 0659   ACIDBASEDEF 1.0 08/08/2019 1115   O2SAT 60.2 08/13/2019 0828   CBG (last 3)  Recent Labs    08/12/19 2323 08/13/19 0420 08/13/19 0829  GLUCAP 192* 201* 208*    Assessment/Plan: S/P Procedure(s) (LRB): STERNAL CLOSURE (N/A) TRANSESOPHAGEAL ECHOCARDIOGRAM (TEE) (N/A) APPLICATION OF WOUND VAC (N/A) Change lines today so that Swan and Cordis sheath can be removed Continue TNA until enteral feedings can be resumed Continue Impella support Patient will probably need tracheostomy but at postop day 3 from delayed sternal closure today is too soon.   LOS: 18 days    Kathlee Nations Trigt III 08/13/2019

## 2019-08-13 NOTE — Progress Notes (Addendum)
ANTICOAGULATION CONSULT NOTE - Follow Up Consult  Pharmacy Consult for heparin Indication: ECMO > impella  Labs: Recent Labs    08/11/19 0534 08/11/19 0535 08/11/19 1346 08/12/19 0454 08/12/19 0454 08/12/19 0455 08/12/19 0455 08/12/19 1617 08/12/19 2325 08/13/19 0431 08/13/19 0659  HGB 11.2*  --    < > 11.5*   < > 11.9*   < >  --   --  11.0* 11.9*  HCT 32.9*  --    < > 34.1*   < > 35.0*  --   --   --  32.3* 35.0*  PLT 100*  --   --  123*  --   --   --   --   --  122*  --   APTT 44*  --   --  43*  --   --   --   --   --  39*  --   HEPARINUNFRC  --  <0.10*  --  <0.10*  --   --   --   --   --  <0.10*  --   CREATININE 1.42*  --    < > 1.27*   < >  --   --  1.28* 1.14 1.14  --    < > = values in this interval not displayed.    Assessment: 64yo male s/p ECMO, now on Impella with heparin only in purge solution at 5.5 mL/hr (275 units/hr). Chest closed 7/2. CT drainage decreased.  HGB stable 11 pltc stable 120s last INR 1.2 6/29  Heparin level remains undetectable, aPTT 39 - about the same as last ones.   New central line placed and new PICC line placed 7/5 - remove swan this evening  Trach planned for later in week   Later this evening will start low dose systemic heparin 200 uts/hr - no titration until discussion on am rounds 7/6  Plan: Continue heparin  in purge solution  Now that chest closed - plan to add systemic heparin for Impella this evening after new IV lines placed and swan pulled  Daily heparin level and CBC.  Leota Sauers Pharm.D. CPP, BCPS Clinical Pharmacist [phone] 08/13/2019 8:31 AM    Please check AMION for all [facility] phone numbers After 10:00 PM, call Main Pharmacy [phone]

## 2019-08-13 NOTE — Progress Notes (Signed)
During PM rounds, RN notified Dr. Donata Clay of patient's fingers being dusky. Pulses present with doppler. He stated to remove ulnar a-line and compare BP on bilat arms. Left arm 117/16 (82), right arm 90/67 (76). MAP on impella 70 at this time.  Leanna Battles, RN

## 2019-08-13 NOTE — Progress Notes (Signed)
Peripherally Inserted Central Catheter Placement  The IV Nurse has discussed with the patient and/or persons authorized to consent for the patient, the purpose of this procedure and the potential benefits and risks involved with this procedure.  The benefits include less needle sticks, lab draws from the catheter, and the patient may be discharged home with the catheter. Risks include, but not limited to, infection, bleeding, blood clot (thrombus formation), and puncture of an artery; nerve damage and irregular heartbeat and possibility to perform a PICC exchange if needed/ordered by physician.  Alternatives to this procedure were also discussed.  Bard Power PICC patient education guide, fact sheet on infection prevention and patient information card has been provided to patient /or left at bedside.  Telephone consent obtained from wife.  PICC Placement Documentation  PICC Triple Lumen 08/13/19 PICC Left Basilic 48 cm 0 cm (Active)  Indication for Insertion or Continuance of Line Vasoactive infusions 08/13/19 1300  Exposed Catheter (cm) 0 cm 08/13/19 1300  Site Assessment Clean;Dry;Intact 08/13/19 1300  Lumen #1 Status Flushed;Saline locked;Blood return noted 08/13/19 1300  Lumen #2 Status Flushed;Saline locked;Blood return noted 08/13/19 1300  Lumen #3 Status Flushed;Saline locked;Blood return noted 08/13/19 1300  Dressing Type Transparent;Securing device 08/13/19 1300  Dressing Status Clean;Dry;Intact;Antimicrobial disc in place 08/13/19 1300  Safety Lock Not Applicable 08/13/19 1300  Line Care Connections checked and tightened 08/13/19 1300  Dressing Intervention New dressing;Other (Comment) 08/13/19 1300  Dressing Change Due 08/20/19 08/13/19 1300       Shravan Salahuddin, Lajean Manes 08/13/2019, 1:04 PM

## 2019-08-14 ENCOUNTER — Inpatient Hospital Stay (HOSPITAL_COMMUNITY): Payer: Medicare Other

## 2019-08-14 LAB — COMPREHENSIVE METABOLIC PANEL
ALT: 47 U/L — ABNORMAL HIGH (ref 0–44)
AST: 133 U/L — ABNORMAL HIGH (ref 15–41)
Albumin: 1.5 g/dL — ABNORMAL LOW (ref 3.5–5.0)
Alkaline Phosphatase: 121 U/L (ref 38–126)
Anion gap: 10 (ref 5–15)
BUN: 20 mg/dL (ref 8–23)
CO2: 30 mmol/L (ref 22–32)
Calcium: 7.2 mg/dL — ABNORMAL LOW (ref 8.9–10.3)
Chloride: 94 mmol/L — ABNORMAL LOW (ref 98–111)
Creatinine, Ser: 1.02 mg/dL (ref 0.61–1.24)
GFR calc Af Amer: >60 mL/min (ref >60)
GFR calc non Af Amer: >60 mL/min (ref >60)
Glucose, Bld: 173 mg/dL — ABNORMAL HIGH (ref 70–99)
Potassium: 3.7 mmol/L (ref 3.5–5.1)
Sodium: 134 mmol/L — ABNORMAL LOW (ref 135–145)
Total Bilirubin: 7.8 mg/dL — ABNORMAL HIGH (ref 0.3–1.2)
Total Protein: 4.6 g/dL — ABNORMAL LOW (ref 6.5–8.1)

## 2019-08-14 LAB — RENAL FUNCTION PANEL
Albumin: 1.3 g/dL — ABNORMAL LOW (ref 3.5–5.0)
Anion gap: 10 (ref 5–15)
BUN: 21 mg/dL (ref 8–23)
CO2: 30 mmol/L (ref 22–32)
Calcium: 6.8 mg/dL — ABNORMAL LOW (ref 8.9–10.3)
Chloride: 93 mmol/L — ABNORMAL LOW (ref 98–111)
Creatinine, Ser: 0.96 mg/dL (ref 0.61–1.24)
GFR calc Af Amer: >60 mL/min (ref >60)
GFR calc non Af Amer: >60 mL/min (ref >60)
Glucose, Bld: 125 mg/dL — ABNORMAL HIGH (ref 70–99)
Phosphorus: 3.3 mg/dL (ref 2.5–4.6)
Potassium: 2.9 mmol/L — ABNORMAL LOW (ref 3.5–5.1)
Sodium: 133 mmol/L — ABNORMAL LOW (ref 135–145)

## 2019-08-14 LAB — CBC
HCT: 33.9 % — ABNORMAL LOW (ref 39.0–52.0)
Hemoglobin: 11.2 g/dL — ABNORMAL LOW (ref 13.0–17.0)
MCH: 29.5 pg (ref 26.0–34.0)
MCHC: 33 g/dL (ref 30.0–36.0)
MCV: 89.2 fL (ref 80.0–100.0)
Platelets: 104 10*3/uL — ABNORMAL LOW (ref 150–400)
RBC: 3.8 MIL/uL — ABNORMAL LOW (ref 4.22–5.81)
RDW: 16.6 % — ABNORMAL HIGH (ref 11.5–15.5)
WBC: 25.7 10*3/uL — ABNORMAL HIGH (ref 4.0–10.5)
nRBC: 0.1 % (ref 0.0–0.2)

## 2019-08-14 LAB — GLUCOSE, CAPILLARY
Glucose-Capillary: 121 mg/dL — ABNORMAL HIGH (ref 70–99)
Glucose-Capillary: 167 mg/dL — ABNORMAL HIGH (ref 70–99)
Glucose-Capillary: 172 mg/dL — ABNORMAL HIGH (ref 70–99)
Glucose-Capillary: 177 mg/dL — ABNORMAL HIGH (ref 70–99)
Glucose-Capillary: 237 mg/dL — ABNORMAL HIGH (ref 70–99)
Glucose-Capillary: 238 mg/dL — ABNORMAL HIGH (ref 70–99)
Glucose-Capillary: 74 mg/dL (ref 70–99)

## 2019-08-14 LAB — MAGNESIUM
Magnesium: 1.7 mg/dL (ref 1.7–2.4)
Magnesium: 1.6 mg/dL — ABNORMAL LOW (ref 1.7–2.4)

## 2019-08-14 LAB — POCT I-STAT 7, (LYTES, BLD GAS, ICA,H+H)
Acid-Base Excess: 8 mmol/L — ABNORMAL HIGH (ref 0.0–2.0)
Bicarbonate: 34.7 mmol/L — ABNORMAL HIGH (ref 20.0–28.0)
Calcium, Ion: 1.05 mmol/L — ABNORMAL LOW (ref 1.15–1.40)
HCT: 38 % — ABNORMAL LOW (ref 39.0–52.0)
Hemoglobin: 12.9 g/dL — ABNORMAL LOW (ref 13.0–17.0)
O2 Saturation: 96 %
Patient temperature: 37.9
Potassium: 3.8 mmol/L (ref 3.5–5.1)
Sodium: 134 mmol/L — ABNORMAL LOW (ref 135–145)
TCO2: 36 mmol/L — ABNORMAL HIGH (ref 22–32)
pCO2 arterial: 60.6 mmHg — ABNORMAL HIGH (ref 32.0–48.0)
pH, Arterial: 7.37 (ref 7.350–7.450)
pO2, Arterial: 90 mmHg (ref 83.0–108.0)

## 2019-08-14 LAB — COOXEMETRY PANEL
Carboxyhemoglobin: 1 % (ref 0.5–1.5)
Methemoglobin: 0.9 % (ref 0.0–1.5)
O2 Saturation: 59.1 %
Total hemoglobin: 11.6 g/dL — ABNORMAL LOW (ref 12.0–16.0)

## 2019-08-14 LAB — BASIC METABOLIC PANEL
Anion gap: 10 (ref 5–15)
BUN: 19 mg/dL (ref 8–23)
CO2: 29 mmol/L (ref 22–32)
Calcium: 6.9 mg/dL — ABNORMAL LOW (ref 8.9–10.3)
Chloride: 93 mmol/L — ABNORMAL LOW (ref 98–111)
Creatinine, Ser: 0.99 mg/dL (ref 0.61–1.24)
GFR calc Af Amer: >60 mL/min (ref >60)
GFR calc non Af Amer: >60 mL/min (ref >60)
Glucose, Bld: 130 mg/dL — ABNORMAL HIGH (ref 70–99)
Potassium: 3.5 mmol/L (ref 3.5–5.1)
Sodium: 132 mmol/L — ABNORMAL LOW (ref 135–145)

## 2019-08-14 LAB — PHOSPHORUS: Phosphorus: 3.9 mg/dL (ref 2.5–4.6)

## 2019-08-14 LAB — LACTATE DEHYDROGENASE: LDH: 323 U/L — ABNORMAL HIGH (ref 98–192)

## 2019-08-14 LAB — TRIGLYCERIDES: Triglycerides: 205 mg/dL — ABNORMAL HIGH (ref <150)

## 2019-08-14 LAB — HEPARIN LEVEL (UNFRACTIONATED): Heparin Unfractionated: <0.1 IU/mL — ABNORMAL LOW (ref 0.30–0.70)

## 2019-08-14 MED ORDER — LIDOCAINE VISCOUS HCL 2 % MT SOLN
15.0000 mL | Freq: Once | OROMUCOSAL | Status: AC
  Administered 2019-08-14: 3 mL via OROMUCOSAL

## 2019-08-14 MED ORDER — ZINC CHLORIDE 1 MG/ML IV SOLN
INTRAVENOUS | Status: AC
  Filled 2019-08-14: qty 633.6

## 2019-08-14 MED ORDER — POTASSIUM CHLORIDE 10 MEQ/50ML IV SOLN
10.0000 meq | INTRAVENOUS | Status: AC
  Administered 2019-08-14 (×4): 10 meq via INTRAVENOUS
  Filled 2019-08-14 (×3): qty 50

## 2019-08-14 MED ORDER — LEVALBUTEROL HCL 1.25 MG/0.5ML IN NEBU
1.2500 mg | INHALATION_SOLUTION | Freq: Four times a day (QID) | RESPIRATORY_TRACT | Status: DC
  Administered 2019-08-14 – 2019-08-15 (×6): 1.25 mg via RESPIRATORY_TRACT
  Filled 2019-08-14 (×6): qty 0.5

## 2019-08-14 MED ORDER — IOHEXOL 300 MG/ML  SOLN
50.0000 mL | Freq: Once | INTRAMUSCULAR | Status: AC | PRN
  Administered 2019-08-14: 10 mL

## 2019-08-14 MED ORDER — PIVOT 1.5 CAL PO LIQD
1000.0000 mL | ORAL | Status: DC
  Administered 2019-08-14: 1000 mL
  Filled 2019-08-14: qty 1000

## 2019-08-14 MED ORDER — POTASSIUM CHLORIDE 10 MEQ/50ML IV SOLN
10.0000 meq | INTRAVENOUS | Status: AC
  Administered 2019-08-14 (×6): 10 meq via INTRAVENOUS
  Filled 2019-08-14: qty 50

## 2019-08-14 MED ORDER — VASOPRESSIN 20 UNITS/100 ML INFUSION FOR SHOCK
0.0400 [IU]/min | INTRAVENOUS | Status: DC
  Administered 2019-08-14 – 2019-08-20 (×14): 0.04 [IU]/min via INTRAVENOUS
  Administered 2019-08-21 – 2019-08-22 (×2): 0.01 [IU]/min via INTRAVENOUS
  Administered 2019-08-23: 0.03 [IU]/min via INTRAVENOUS
  Administered 2019-08-24: 0.02 [IU]/min via INTRAVENOUS
  Filled 2019-08-14 (×24): qty 100

## 2019-08-14 MED ORDER — POTASSIUM CHLORIDE 10 MEQ/50ML IV SOLN
10.0000 meq | INTRAVENOUS | Status: AC
  Administered 2019-08-14 (×3): 10 meq via INTRAVENOUS
  Filled 2019-08-14 (×2): qty 50

## 2019-08-14 MED ORDER — POTASSIUM CHLORIDE 10 MEQ/50ML IV SOLN
10.0000 meq | INTRAVENOUS | Status: AC
  Administered 2019-08-14 (×3): 10 meq via INTRAVENOUS
  Filled 2019-08-14: qty 50

## 2019-08-14 MED ORDER — MAGNESIUM SULFATE 50 % IJ SOLN
3.0000 g | Freq: Once | INTRAVENOUS | Status: AC
  Administered 2019-08-14: 3 g via INTRAVENOUS
  Filled 2019-08-14: qty 6

## 2019-08-14 NOTE — Progress Notes (Signed)
Patient ID: Todd [name], male   DOB: 10/11/1955, 64 y.o.   MRN: [id number]     Advanced Heart Failure Rounding Note  PCP-Cardiologist: No primary care provider on file.   Subjective:    08/02/19 CABG 08/03/19 VF arrest. Chest open at bedside 08/03/19 Back to OR for ECMO cannulation 08/03/19 Washout out for tamponade 08/04/19 Repeat bedside washout for tamponade 08/06/19 To OR for decannulation and Impella 5.5 08/07/19 Underwent emergent bedside chest exploration at bedside for tamponade  08/10/19 Chest closed  Remains on vent and Impella 5.5, now at P4.  Low dose heparin gtt started yesterday. Swan removed and got CVL + PICC yesterday.   Dusky fingers on right hand, arterial line out.  Epinephrine 3, milrinone 0.125, NE 26, vasopressin 0.04.  MAP in upper 60s this morning.  Co-ox 59%.   NSVT runs but shorter and less frequent now.  Remains on amiodarone 60 mg/hr.  K has been very difficult to replace but up to 3.7 this morning.  Lasix gtt currently at 4 mg/hr, I>>O.  CVP 12.   CXR with persistent bibasilar infiltrates, Tm 100.4, WBCs higher at 26.   Has severe ileus, unable to get Cortrak post-pyloric.  Getting TPN now.   Impella 5.5 P-4 Waveforms ok Flow 2.4 No alarms  Objective:   Weight Range: 83.6 kg Body mass index is 27.22 kg/m.   Vital Signs:   Temp:  [99.9 F (37.7 C)-100.4 F (38 C)] 100.4 F (38 C) (07/06 0600) Pulse Rate:  [75-88] 80 (07/06 0600) Resp:  [12-26] 21 (07/06 0600) BP: (80-120)/(51-77) 107/67 (07/06 0600) SpO2:  [92 %-98 %] 95 % (07/06 0600) Arterial Line BP: (65-100)/(52-82) 79/56 (07/05 1844) FiO2 (%):  [40 %-50 %] 50 % (07/06 0320) Last BM Date: 08/12/19  Weight change: Filed Weights   08/11/19 0500 08/12/19 0500 08/13/19 0500  Weight: 79.6 kg 83.2 kg 83.6 kg    Intake/Output:   Intake/Output Summary (Last 24 hours) at 08/14/2019 0700 Last data filed at 08/14/2019 0600 Gross per 24 hour  Intake 5466.98 ml  Output 3065 ml  Net 2401.98 ml       Physical Exam    General: Intubated/sedated.  Neck: JVP 12 cm, no thyromegaly or thyroid nodule.  Lungs: Decreased at bases.  CV: Nondisplaced PMI.  Heart regular S1/S2, no S3/S4, no murmur.  1+ ankle edema.  Abdomen: Soft, nontender, no hepatosplenomegaly, no distention.  Skin: Fingers right hand dusky  Neurologic: Sedated on vent Extremities: Dusky fingers right hand.  HEENT: Normal.    Telemetry   Looks like type 1 2nd degree AVB, short NSVT runs Personally reviewed   Labs    CBC Recent Labs    08/13/19 0431 08/13/19 0659 08/14/19 0226 08/14/19 0413  WBC 22.3*  --   --  25.7*  NEUTROABS 19.4*  --   --   --   HGB 11.0*   < > 12.9* 11.2*  HCT 32.3*   < > 38.0* 33.9*  MCV 86.8  --   --  89.2  PLT 122*  --   --  104*   < > = values in this interval not displayed.   Basic Metabolic Panel Recent Labs    08/13/19 1442 08/13/19 1442 08/13/19 2317 08/13/19 2317 08/14/19 0226 08/14/19 0413  NA 131*   < > 132*   < > 134* 134*  K 3.0*   < > 3.5   < > 3.8 3.7  CL 90*   < > 93*  --   --  94*  CO2 32   < > 29  --   --  30  GLUCOSE 173*   < > 130*  --   --  173*  BUN 16   < > 19  --   --  20  CREATININE 1.04   < > 0.99  --   --  1.02  CALCIUM 7.2*   < > 6.9*  --   --  7.2*  MG  --   --  1.7  --   --  1.6*  PHOS 3.4  --   --   --   --  3.9   < > = values in this interval not displayed.   Liver Function Tests Recent Labs    08/13/19 0431 08/13/19 0431 08/13/19 1442 08/14/19 0413  AST 110*  --   --  133*  ALT 35  --   --  47*  ALKPHOS 121  --   --  121  BILITOT 7.1*  --   --  7.8*  PROT 4.5*  --   --  4.6*  ALBUMIN 1.6*   < > 1.6* 1.5*   < > = values in this interval not displayed.   No results for input(s): LIPASE, AMYLASE in the last 72 hours. Cardiac Enzymes No results for input(s): CKTOTAL, CKMB, CKMBINDEX, TROPONINI in the last 72 hours.  BNP: BNP (last 3 results) Recent Labs    07/26/19 1236  BNP 2,568.2*    ProBNP (last 3 results) No  results for input(s): PROBNP in the last 8760 hours.   D-Dimer No results for input(s): DDIMER in the last 72 hours. Hemoglobin A1C No results for input(s): HGBA1C in the last 72 hours. Fasting Lipid Panel Recent Labs    08/14/19 0413  TRIG 205*   Thyroid Function Tests No results for input(s): TSH, T4TOTAL, T3FREE, THYROIDAB in the last 72 hours.  Invalid input(s): FREET3  Other results:   Imaging    DG CHEST PORT 1 VIEW  Result Date: 08/14/2019 CLINICAL DATA:  Chest tube in place EXAM: PORTABLE CHEST 1 VIEW COMPARISON:  08/13/2019 FINDINGS: Endotracheal tube, gastric catheter and feeding catheter are noted and stable. Impella device is noted in satisfactory position. Swan-Ganz catheter has been removed in the interval. Left-sided PICC line and left sided subclavian central line are now seen. Bilateral chest tubes are noted. Persistent right basilar opacity and effusion is seen. The left lung again shows retrocardiac opacities stable from the prior exam. The overlying interstitial prominence has improved likely related to decreased pulmonary edema. No pneumothorax is noted. IMPRESSION: Decrease in vascular congestion and edema. Persistent bibasilar opacities and effusions. New PICC line is seen in satisfactory position. Tubes and lines as described stable from the prior exam. Electronically Signed   By: Inez Catalina M.D.   On: 08/14/2019 02:36   DG CHEST PORT 1 VIEW  Result Date: 08/13/2019 CLINICAL DATA:  64 year old male status post central line placement. EXAM: PORTABLE CHEST 1 VIEW COMPARISON:  Chest x-ray 08/13/2019. FINDINGS: An endotracheal tube is in place with tip 4.9 cm above the carina. Right IJ Cordis through which a Swan-Ganz catheter has been passed into the right pulmonary artery. Bilateral chest tubes appear similarly position with tips projecting over the mid to upper thorax bilaterally. A feeding tube is seen extending into the abdomen, however, the tip of the feeding  tube extends below the lower margin of the image. Midline mediastinal drain. Left ventricular assist device (Impella) via right subclavian approach.  Midline skin staples and skin staples projecting over the right axillary region. Lung volumes are slightly low. Bibasilar opacities may reflect areas of atelectasis and/or consolidation. Small bilateral pleural effusions. Mild diffuse interstitial prominence. Pulmonary vasculature does not appear engorged. Mild cardiomegaly. Upper mediastinal contours are within normal limits. Status post median sternotomy for CABG. IMPRESSION: 1. Support apparatus and postoperative changes, as above. 2. Low lung volumes with bibasilar areas of atelectasis and/or consolidation and small bilateral pleural effusions. Diffuse interstitial prominence, similar to the prior study. 3. Mild cardiomegaly. Electronically Signed   By: Vinnie Langton M.D.   On: 08/13/2019 11:20   DG Abd Portable 1V  Result Date: 08/13/2019 CLINICAL DATA:  Feeding tube advanced.  Check tube placement. EXAM: PORTABLE ABDOMEN - 1 VIEW COMPARISON:  08/10/2019 FINDINGS: Feeding tube has been advanced, tip in the region of the pylorus. A loop of tube extends into the gastric fundus. Nasogastric tube is in place, tip overlying the level of the stomach. Swan-Ganz catheter, LVAD again noted. There is paucity of bowel gas. No evidence for intraperitoneal air. IMPRESSION: Feeding tube tip in the region of the pylorus. Electronically Signed   By: Nolon Nations M.D.   On: 08/13/2019 10:17   ECHOCARDIOGRAM LIMITED  Result Date: 08/13/2019    ECHOCARDIOGRAM LIMITED REPORT   Patient Name:   DAVIDLEE JEANBAPTISTE Date of Exam: 08/13/2019 Medical Rec #:  [id number]  Height:       69.0 in Accession #:    [id number] Weight:       184.3 lb Date of Birth:  [birth year] BSA:          1.995 m Patient Age:    36 years   BP:           113/61 mmHg Patient Gender: M          HR:           75 bpm. Exam Location:  Inpatient Procedure: Limited Echo  Indications:    Impella placement  History:        Patient has prior history of Echocardiogram examinations, most                 recent 08/11/2019. CHF, CAD; Risk Factors:Diabetes, Dyslipidemia,                 Hypertension and Current Smoker.  Sonographer:    Vickie Epley RDCS Referring Phys: [place]  1. Limited study to assess Impella placement. There is severe LV dysfunction with septal dyskinesis and apical akinesis. Impella is stable in position, 5.02 cm from aortic valve. FINDINGS  Left Ventricle: Limited study to assess Impella placement. There is severe LV dysfunction with septal dyskinesis and apical akinesis. Impella is stable in position, 5.02 cm from aortic valve. Rozann Lesches MD Electronically signed by Rozann Lesches MD Signature Date/Time: 08/13/2019/11:38:58 AM    Final      Medications:     Scheduled Medications: . artificial tears   Both Eyes Q8H  . aspirin  324 mg Per Tube Daily  . aspirin  300 mg Rectal Daily  . chlorhexidine gluconate (MEDLINE KIT)  15 mL Mouth Rinse BID  . Chlorhexidine Gluconate Cloth  6 each Topical Daily  . docusate  200 mg Per Tube Daily  . fentaNYL (SUBLIMAZE) injection  50 mcg Intravenous Once  . insulin aspart  0-24 Units Subcutaneous Q4H  . insulin detemir  10 Units Subcutaneous BID  . ipratropium  0.5 mg Nebulization Q6H  .  levalbuterol  1.25 mg Nebulization Q6H  . mouth rinse  15 mL Mouth Rinse 10 times per day  . metoCLOPramide (REGLAN) injection  10 mg Intravenous Q8H  . pantoprazole (PROTONIX) IV  40 mg Intravenous QHS  . sodium chloride flush  10-40 mL Intracatheter Q12H  . sodium chloride flush  10-40 mL Intracatheter Q12H  . sodium chloride flush  3 mL Intravenous Q12H    Infusions: . sodium chloride 250 mL (08/13/19 1412)  . sodium chloride Stopped (08/13/19 1631)  . amiodarone 60 mg/hr (08/14/19 0600)  . epinephrine 3 mcg/min (08/14/19 0600)  . fentaNYL infusion INTRAVENOUS 225 mcg/hr (08/14/19 0600)  .  furosemide (LASIX) infusion 4 mg/hr (08/14/19 0600)  . impella catheter heparin 50 unit/mL in dextrose 5%    . heparin 200 Units/hr (08/14/19 0600)  . lactated ringers 20 mL/hr at 08/10/19 1705  . lactated ringers 20 mL/hr at 08/14/19 0600  . magnesium sulfate bolus IVPB    . meropenem (MERREM) IV Stopped (08/14/19 0554)  . midazolam 2 mg/hr (08/14/19 0600)  . milrinone 0.125 mcg/kg/min (08/14/19 0600)  . norepinephrine (LEVOPHED) Adult infusion 26 mcg/min (08/14/19 0600)  . potassium chloride 10 mEq (08/14/19 0658)  . potassium chloride    . propofol (DIPRIVAN) infusion Stopped (08/13/19 1420)  . TPN ADULT (ION) 35 mL/hr at 08/14/19 0600  . vancomycin Stopped (08/13/19 1617)  . vasopressin (PITRESSIN) infusion - *FOR SHOCK* 0.04 Units/min (08/14/19 0514)    PRN Medications: sodium chloride, sodium chloride, dextrose, fentaNYL, metoprolol tartrate, midazolam, morphine injection, ondansetron (ZOFRAN) IV, sodium chloride flush, sodium chloride flush, sodium chloride flush   Assessment/Plan   1. Cardiogenic shock: Echo with EF 20-25%, moderate RV dysfunction.  VA ECMO post-VF arrest, cannulated 6/25.  Stable s/p return to OR for mediastinal re-exploration due to bleeding on 6/25.  Decannulated from [place] ECMO on 08/06/19. Impella 5.5 placed. Underwent emergent chest re-exploration on 6/29 for tamponade and clot removal. Chest closed 7/2.  This morning, still on significant pressor/inotrope support with epinephrine 3, NE, 26, milrinone 0.125, vasopressin 0.04. Have not been able to wean. ?Component of septic/vasodilatory shock as well with low grade fever, rising WBCs.  Co-ox 59%, CVP 12 with I>>O.  Lasix turned down to 4 mg/hr with low K.  Impella 5.5 at P4, position stable on echo yesterday.  - Continue current pressors/inotropes, wean as able today.  - Continue Impella at P4.  He is now on heparin gtt.  - Difficult situation with volume overload but difficult to maintain K and having NSVT => K  better today, will continue aggressive repletion. Increase Lasix to 8 mg/hr.  2. 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  Prolonged VF arrest on 6/25. Chest closed 7/2.  - Continue ASA pr and Crestor will be given when we have post-pyloric access.  3. Cardiac arrest: VF arrest with prolonged code. Still with episodes of VT/NSVT in setting of low K (difficult to effectively replace).  - Aggressive K replacement as above.   - Continue IV amiodarone at 60 mg/hr for now.   - Eventual evaluation for ICD or lifevest at d/c - Keep K > 4.0 Mg > 2.0 4. Acute hypoxemic respiratory failure: In setting of cardiac arrest.  H/o COPD.  Bibasilar infiltrates, ?HCAP.   - On vent, CCM following => plan for tracheostomy this week.  - Getting vancomycin/meropenem for possible PNA.   5. Acute blood loss anemia: Now s/p mediastinal re-exploration x 3. Hgb stable  today.  CT drainage has slowed.   - Now on heparin gtt. 6. Thrombocytopenia: 104 this morning.  7. ID: Possible HCAP, on meropenem/vancomycin.  WBCs 26 => 22 => 26. Tm 100.4.  ?Component of septic shock. Cultures negative so far except few Candida in tracheal aspirate.  8. Elevated bilirubin: Mostly direct = likely cholestatic/shock liver/RV failure predominantly.  May have some hemolysis from pump but LDH has been stable 495 -> 469.  Order LDH this morning.  9. Ileus: Getting TPN at this point.  10. Atrial fibrillation: Paroxysmal.  Appears to be in type 1 2nd degree AVB today.  - Continue amiodarone gtt.  - Low dose heparin gtt.  - Needs ECG.   CRITICAL CARE Performed by: Loralie Champagne  Total critical care time: 45 minutes at bedside  Critical care time was exclusive of separately billable procedures and treating other patients.  Critical care was necessary to treat or prevent imminent or life-threatening deterioration.  Critical care was time spent personally by me on the following activities: development of  treatment plan with patient and/or surrogate as well as nursing, discussions with consultants, evaluation of patient's response to treatment, examination of patient, obtaining history from patient or surrogate, ordering and performing treatments and interventions, ordering and review of laboratory studies, ordering and review of radiographic studies, pulse oximetry and re-evaluation of patient's condition.   Length of Stay: [place], MD  08/14/2019, 7:00 AM  Advanced Heart Failure Team Pager [phone] (M-F; 7a - 4p)  Please contact [facility] for night-coverage after hours (4p -7a ) and weekends on [email or web address]

## 2019-08-14 NOTE — Consult Note (Signed)
WOC Nurse wound follow up Patient receiving care in [facility] 2H2. I spoke with the primary RN in the patient's room.  He remains too unstable to turn. When he is turned he goes into Ventricular Tachycardia.  Therefore, I am unable to do a sacral wound assessment. Helmut Muster, RN, MSN, CWOCN, CNS-BC, pager [phone]

## 2019-08-14 NOTE — Progress Notes (Signed)
ANTICOAGULATION CONSULT NOTE - Follow Up Consult  Pharmacy Consult for heparin Indication: ECMO > impella  Labs: Recent Labs    08/12/19 0454 08/12/19 0455 08/13/19 0431 08/13/19 0431 08/13/19 0659 08/13/19 0659 08/13/19 1442 08/13/19 2317 08/14/19 0226 08/14/19 0413  HGB 11.5*   < > 11.0*   < > 11.9*   < >  --   --  12.9* 11.2*  HCT 34.1*   < > 32.3*   < > 35.0*  --   --   --  38.0* 33.9*  PLT 123*  --  122*  --   --   --   --   --   --  104*  APTT 43*  --  39*  --   --   --   --   --   --   --   HEPARINUNFRC <0.10*  --  <0.10*  --   --   --   --   --   --  <0.10*  CREATININE 1.27*   < > 1.14   < >  --   --  1.04 0.99  --  1.02   < > = values in this interval not displayed.    Assessment: 64yo male s/p ECMO, now on Impella. Chest closed 7/2. CT drainage decreased.  HGB stable 11, pltc stable 104. Last INR 1.2 6/29  Heparin level remains undetectable, on 305 units/hr (purge flow 6.1 mL/hr) from purge solution and 200 units/hr systemically.  New central line placed and new PICC line placed 7/5. Trach planned for later in week.   Plan: Continue heparin in purge solution  Start systemic heparin infusion at 200 units/hr - no titration at this time Daily heparin level and CBC.  Sherron Monday, PharmD, BCCCP Clinical Pharmacist  Phone: [phone] 08/14/2019 7:19 AM  Please check AMION for all [facility] Pharmacy phone numbers After 10:00 PM, call Main Pharmacy [phone]

## 2019-08-14 NOTE — Progress Notes (Signed)
4 Days Post-Op Procedure(s) (LRB): STERNAL CLOSURE (N/A) TRANSESOPHAGEAL ECHOCARDIOGRAM (TEE) (N/A) APPLICATION OF WOUND VAC (N/A) Subjective: Events of past week noted Intubated, sedated  Objective: Vital signs in last 24 hours: Temp:  [99.9 F (37.7 C)-100.4 F (38 C)] 100.4 F (38 C) (07/06 0700) Pulse Rate:  [77-89] 89 (07/06 0700) Cardiac Rhythm: Normal sinus rhythm (07/06 0800) Resp:  [12-26] 20 (07/06 0700) BP: (80-117)/(51-77) 104/66 (07/06 0700) SpO2:  [92 %-98 %] 94 % (07/06 0752) Arterial Line BP: (65-85)/(52-62) 79/56 (07/05 1844) FiO2 (%):  [40 %-50 %] 50 % (07/06 0752)  Hemodynamic parameters for last 24 hours: PAP: (20-26)/(12-17) 24/16 CVP:  [8 mmHg-10 mmHg] 8 mmHg CO:  [4.5 L/min] 4.5 L/min CI:  [2.4 L/min/m2] 2.4 L/min/m2  Intake/Output from previous day: 07/05 0701 - 07/06 0700 In: 5636.9 [I.V.:4203.7; IV Piggyback:1294.1] Out: 3405 [Urine:2545; Emesis/NG output:150; Stool:50; Chest Tube:660] Intake/Output this shift: Total I/O In: 354.1 [I.V.:277.6; Other:5.8; IV Piggyback:70.7] Out: 430 [Urine:200; Chest Tube:230]  General appearance: intubated Heart: regular rate and rhythm Lungs: rhonchi bilaterally Abdomen: non distended Extremities: R fingertips blue, otherwise well perfused Wound: -VAC in place  Lab Results: Recent Labs    08/13/19 0431 08/13/19 0659 08/14/19 0226 08/14/19 0413  WBC 22.3*  --   --  25.7*  HGB 11.0*   < > 12.9* 11.2*  HCT 32.3*   < > 38.0* 33.9*  PLT 122*  --   --  104*   < > = values in this interval not displayed.   BMET:  Recent Labs    08/13/19 2317 08/13/19 2317 08/14/19 0226 08/14/19 0413  NA 132*   < > 134* 134*  K 3.5   < > 3.8 3.7  CL 93*  --   --  94*  CO2 29  --   --  30  GLUCOSE 130*  --   --  173*  BUN 19  --   --  20  CREATININE 0.99  --   --  1.02  CALCIUM 6.9*  --   --  7.2*   < > = values in this interval not displayed.    PT/INR: No results for input(s): LABPROT, INR in the last 72  hours. ABG    Component Value Date/Time   PHART 7.370 08/14/2019 0226   HCO3 34.7 (H) 08/14/2019 0226   TCO2 36 (H) 08/14/2019 0226   ACIDBASEDEF 1.0 08/08/2019 1115   O2SAT 59.1 08/14/2019 0413   CBG (last 3)  Recent Labs    08/13/19 2323 08/14/19 0420 08/14/19 0749  GLUCAP 131* 172* 177*    Assessment/Plan: S/P Procedure(s) (LRB): STERNAL CLOSURE (N/A) TRANSESOPHAGEAL ECHOCARDIOGRAM (TEE) (N/A) APPLICATION OF WOUND VAC (N/A) - Remains critically ill with multiple organ system failure  CV- Impella on P4, flows ~2 L/min, co-ox 59  On milrinone, epi, norepi, vasopressin  norepi has been weaned a little  VT with movement, less frequent with correction of K  RESP_ VDRF- on full vent support, pCO2 60, oxygenation OK  Pleural tubes still with significant output- keep in place  RENAL- creatinine normal. Hypokalemia better- continue to supplement K  Hypomagnesemia- being corrected  Continue diuresis  GI/ Nutrition- severe protein calorie malnutrition- he was unable to tolerate gastric feeds, on TNA, will try again later this week to have Cor track tube placed postpyloric  ID- Low grade temp and elevated WBC - on Vanco and meropenem  Blood cultures negative  Yeast in sputum, Diflucan not a good choice due to amiodarone/ VT  Will defer to critical care whether another antifungal would be appropriate  ENDO- CBG mildly elevated with TNA, continue insulin   LOS: 19 days    [name] 08/14/2019

## 2019-08-14 NOTE — Progress Notes (Addendum)
NAME:  Todd [name], MRN:  [id number], DOB:  01/01/1956, LOS: 19 ADMISSION DATE:  07/26/2019, CONSULTATION DATE:  08/03/19 REFERRING MD:  Dorris Fetch, CHIEF COMPLAINT:  ECMO   Brief History   VA ECMO for post CABG Vtach arrest  History of present illness   Presented with worsening dyspnea 6/17 c/w CHF exacerbation Cath with severe triple vessel disease Underwent CABG 6/24 Early this AM Vtach arrest Chest opened bedside and cardiac massage initiated as well as multiple cardioversions, amiodarone, bicarb etc Brought to OR and cannulated for VA ECMO PCCM consulted to assist with management Comorbidities include DM, heavy smoking, COPD  Past Medical History  Depression Ischemic cardiomyopathy HTN HLD  Significant Hospital Events   6/24 CABG 6/25 VA cannulation 6/28 decannulated and impella placed 6/29 bedside re-exploration; s/p decannulation. Placement of impella device originally at p8 but decreased to p4 2/2 suction events overnight up to p6. Echo completed and repositioned device. Remained on considerable support with 8 epi and 46 norepi vaso 0.05. continued chest tube output with large clots noted. Heparin thru device. Replacement products ongoing. 60% 8 peep 6/30: bedside mediastinal re-exploration and clean out of hematoma with tamponade and worsening hemodynamics. Pt had large volume transfusion. rebolused with amio 2/2 nsvt episode.  Weight up 48 pounds.  Started diuresis, Lasix drip started. 7/1 iatrogenic respiratory alkalosis, vent rate decreased. 7/2: No significant issues overnight remains on pressors and Impella not tolerating tube feeds with high gastric output awaiting core track placement 7/3: started trickle TF  7/5: Swab removed, CVL and PICC placed.   Consults:  CHF, PCCM, TCTS  Procedures:  See below  Significant Diagnostic Tests:  6/22 spirometry with restrictive physiology, preserved FEV1/FVC CXR today with bilateral airspace disease, cannulas in good  position, deep left sulcus that was present yesterday, no clear PTX 6/18 echo: LVEF 20-25%, grade I diastolic dysfunction  Micro Data:  COVID and HIV Neg  Antimicrobials:  Vanc6/24>> Cefuroxime 6/24>> 6/25 merrem 6/26->  Interim history/subjective:   Yesterday patient had Swans removed.PICC line and CVL placed. Febrile overnight with increase in WBC.   Objective   Blood pressure 104/66, pulse 89, temperature (!) 100.4 F (38 C), resp. rate 20, height 5\' 9"  (1.753 m), weight 83.6 kg, SpO2 95 %. PAP: (20-27)/(12-18) 24/16 CVP:  [8 mmHg-10 mmHg] 8 mmHg CO:  [4.5 L/min] 4.5 L/min CI:  [2.4 L/min/m2] 2.4 L/min/m2  Vent Mode: PRVC FiO2 (%):  [40 %-50 %] 50 % Set Rate:  [20 bmp] 20 bmp Vt Set:  [420 mL] 420 mL PEEP:  [5 cmH20-8 cmH20] 8 cmH20 Plateau Pressure:  [16 cmH20-23 cmH20] 19 cmH20   Intake/Output Summary (Last 24 hours) at 08/14/2019 0752 Last data filed at 08/14/2019 0700 Gross per 24 hour  Intake 5636.91 ml  Output 3405 ml  Net 2231.91 ml   Filed Weights   08/11/19 0500 08/12/19 0500 08/13/19 0500  Weight: 79.6 kg 83.2 kg 83.6 kg    Examination:  General: elderly male, sedated , on vent HEENT: pupil reactive to light, trachea midline Cardiovascular: Normal rate, regular rhythm.  No murmurs, rubs, or gallops Chest: midline chest wound with wound vac Pulmonary : BL vented breath sounds Abdominal: soft, nontender Ext: dusky right finger tips, other ext well perfused  Skin: Warm, dry   Review labs: Mg 1.6 , Phos 3.9, Albumin 1.5, Ca corrected 9.2, total Billi 7.8, AST 133, ALT 47  Resolved Hospital Problem list   N/A  Assessment & Plan:   Severe coronary artery disease w/  severe ICM status post CABG complicated by VT arrest, cardiogenic shock and hemorrhagic shock: EF 20 to 25% with severe LV dysfunction and moderate RV dysfunction Recurrent pulseless VT with awaking off propofol  -VA ECMO; now decannulated -Impella device initiated 6/28 -Has required  reexploration x3 of the chest as well as massive volume of blood transfusion - Continues to need pressor/ionotrope support - On levo , on impella P-4 , milrinone , vasopressin  Plan Amiodarone  Asa+statin IV heparin impella and pressor wean per AHF service   Sedation needs - propofol stopped 7/5 - potential need for trach was discussed with teams on 7/5 - continue low dose versed  - continue fent   Acute hypoxic respiratory failure w/ need for mechanical ventilation  -cxr: sternotomy., basilar atelectasis, The patient's images have been independently reviewed by me.   - resp alkalosis, metabolic alkalosis, mixed Plan -Continue vent support  - Continuing diuresis  Fluid and electrolyte balance:  Plan Replete lytes as needed Follow closely Per cardiology   T2DM:  Glycemic control acceptable  Plan Started levemir 10 units BID 7/5 SSI + CBG   Leukocytosis.   Trended up and now approximately same. Fevered overnight on antibiotics.  Plan On Merc+Vanco Would like to stop Plan stop day 7/4, continued on, Mero+vanco ( day 11)   Acute blood loss anemia and thrombocytopenia: Hemoglobin and platelet count stable  Plan Observe    Elevated Direct Bilirubin and transaminases  Observe   Nutrition:  Started on TPN 7/5   Best practice:  Diet: TPN Pain/Anxiety/Delirium protocol (if indicated): fentanyl, versed VAP protocol (if indicated): in place DVT prophylaxis: heparin gtt via device GI prophylaxis: PPI Glucose control: see above Mobility: BR Code Status: Full Family Communication: per primary Disposition:  ICU  Thurmon Fair, MD PGY2     PCCM attending:  64 year old gentleman status post CABG severe coronary artery disease complicated by cardiogenic shock reduced ejection fraction acute on chronic systolic heart failure recurrent pulseless VT.  Still requiring full support with Impella and vasopressors.  BP 97/62   Pulse 86   Temp 100.2 F (37.9 C)    Resp 20   Ht 5\' 9"  (1.753 m)   Wt 83.6 kg   SpO2 96%   BMI 27.22 kg/m   General: Elderly male intubated on mechanical life support critically ill HEENT: NCAT, pupils are reactive Heart: Regular rhythm S1-S2 Lungs: Bilateral mechanically ventilated breath sounds  Labs: Reviewed  Assessment: Severe coronary artery disease, ischemic cardiomyopathy status post CABG complicated by VT arrest was initially on ECMO decannulated now.  Still with acute systolic heart failure reduced ejection fraction, RV dysfunction and recurrent pulseless VT.  Patient is currently supported with Impella Acute hypoxemic respiratory failure requiring mechanical ventilation secondary to above.  Plan: Continue weaning from vasopressors as tolerated Continue dropping Impala and vasopressor support per advanced heart failure service Continue amiodarone Aspirin plus statin IV heparin Remains on antibiotics due to fevers. CBGs with SSI. TNA prior to CTS.  This patient is critically ill with multiple organ system failure; which, requires frequent high complexity decision making, assessment, support, evaluation, and titration of therapies. This was completed through the application of advanced monitoring technologies and extensive interpretation of multiple databases. During this encounter critical care time was devoted to patient care services described in this note for 32 minutes.  , DO [facility] 08/14/2019 1:43 PM

## 2019-08-14 NOTE — Progress Notes (Signed)
Patient ID: Rhyatt Muska, male   DOB: 1955-02-10, 64 y.o.   MRN: [id number] TCTS Evening Rounds:  Remains critically ill overall but stable.  Milrinone 0.125, epi 3, NE 23, vaso 0.04, Impella at P4.  Sinus rhythm on IV amio.  Vent dependent and sedated on fentanyl and versed.  Diuresing well on lasix drip.  BMET    Component Value Date/Time   NA 133 (L) 08/14/2019 1519   K 2.9 (L) 08/14/2019 1519   CL 93 (L) 08/14/2019 1519   CO2 30 08/14/2019 1519   GLUCOSE 125 (H) 08/14/2019 1519   BUN 21 08/14/2019 1519   CREATININE 0.96 08/14/2019 1519   CALCIUM 6.8 (L) 08/14/2019 1519   GFRNONAA >60 08/14/2019 1519   GFRAA >60 08/14/2019 1519   Replacing K+

## 2019-08-14 NOTE — Progress Notes (Addendum)
PHARMACY - TOTAL PARENTERAL NUTRITION CONSULT NOTE  Indication:  Intolerance to EN / Ileus  Patient Measurements: Height: 5\' 9"  (175.3 cm) Weight: 83.6 kg (184 lb 4.9 oz) IBW/kg (Calculated) : 70.7 TPN AdjBW (KG): 81.3 Body mass index is 27.22 kg/m.  Assessment: 64 YOF presented on 07/26/19 with SOB 2/2 CHF exacerbation.  Cath shows severe multi-vessel disease and patient underwent CABG on 08/02/19.  Post-op course complicated by Vtach arrest, bleeding, and pericardia effusion with tamponade. Started on 08/04/19 ECMO on 08/03/19, decannulated and Impella placed 08/06/19, chest closed on 08/10/19.  Patient was on a carb modified diet on admission through 08/01/19, then started on trickle feed on 08/04/19 but was interrupted frequently for OR visits and complications.  Patient had an episode of emesis on 08/11/19 and TF was held again.  Pharmacy consulted for TPN.  Patient has moderate malnutrition per documentation and has been nutritionally deficient.  Patient remains on multiple vasopressors, intubated and sedated on fentanyl and Versed. Propofol stopped 7/5. Patient currently with no enteral access.  Glucose / Insulin: hx DM, A1c 9% - CBGs improved to 130-177, now controlled on Levemir 10 units BID + 24 units SSI utilized / 24 hrs Electrolytes: Na up to 134 / Cl up to 94, K improved to 3.7 (on Lasix gtt, total 9 K runs already ordered per MD this AM, goal >/=4 for ileus), low iCa improved (CoCa WNL), Mag 1.6 (Mag sulfate 3g IV x 1 already given this AM; goal >/=2 for ileus); others WNL Renal: SCr down to 1.02, BUN WNL, Lasix drip increased to 8 mg/hr on 7/6 LFTs / TGs: LFTs mildly elevated - trend up, Tbili up to 7.8 (RN reports patient jaundiced), TG trend down to 205 (off propofol 7/5) Prealbumin / albumin: Prealbumin 6.3; albumin 1.5 Intake / Output; MIVF: UOP 1.4 ml/kg/hr, NG output 9/6, stool 90mL, chest tube 45m, net +14L GI Imaging: none since TPN Surgeries / Procedures: none since  TPN  Central access: PICC and CVC triple lumen placed 7/5 TPN start date: 08/12/19  Nutritional Goals (per RD rec on 7/2): 1820-2184 kCal, 110-146gm AA, >/= 1.8L fluid per day  TPN at goal rate 70 ml/hr (concentrated) will provide 121g protein, 252g CHO, and 57g SMOF lipids, for total 1913 kCal/day  Current Nutrition:  TPN NPO  Plan:  Increase concentrated TPN to 55 mL/hr at 1800 (goal rate 70 ml/hr). Plan to titrate up to goal tomorrow if patient tolerates. Will add SMOF lipids today with patient off propofol and TG improving. TPN will provide 95g AA, 198g CHO, and 45g SMOF lipids, for a total of 1501 kCal - meeting ~86% of protein and ~82% of kCal needs Electrolytes in TPN: adjust lytes with rate increase - Na 147mEq/L, K 39mEq/L (53mEq/day), Ca 57mEq/L, Mag 84mEq/L, Phos 5mmol/L; continue max Cl Add daily multivitamin to TPN. Remove standard trace elements with Tbili >6 and add back zinc 5mg , chromium 12m, and selenium to TPN.  Continue Levemir 10 units [place] BID added per CVTS + continue CVTS SSI Q4H - watch CBGs closely Monitor standard TPN labs, f/u ability to advance Cortrak to post-pyloric (potentially today per discussion with RN)   , PharmD, BCPS Please check AMION for all [facility] contact numbers Clinical Pharmacist 08/14/2019 9:17 AM

## 2019-08-14 NOTE — Progress Notes (Signed)
eLink Physician-Brief Progress Note Patient Name: Todd [name] DOB: [birth year] MRN: [id number]   Date of Service  08/14/2019  HPI/Events of Note  Hypoxia - CXR with R base atelectasis and ?pulmonary congestion. Retained secretions also an issue. Sat increased to 97% post suctioning.   eICU Interventions  Increase PEEP to 8.      Intervention Category Major Interventions: Hypoxemia - evaluation and management  Todd [name] 08/14/2019, 2:42 AM

## 2019-08-15 ENCOUNTER — Inpatient Hospital Stay (HOSPITAL_COMMUNITY): Payer: Medicare Other

## 2019-08-15 DIAGNOSIS — Z952 Presence of prosthetic heart valve: Secondary | ICD-10-CM

## 2019-08-15 LAB — COMPREHENSIVE METABOLIC PANEL
ALT: 55 U/L — ABNORMAL HIGH (ref 0–44)
AST: 143 U/L — ABNORMAL HIGH (ref 15–41)
Albumin: 1.3 g/dL — ABNORMAL LOW (ref 3.5–5.0)
Alkaline Phosphatase: 115 U/L (ref 38–126)
Anion gap: 9 (ref 5–15)
BUN: 26 mg/dL — ABNORMAL HIGH (ref 8–23)
CO2: 29 mmol/L (ref 22–32)
Calcium: 7.1 mg/dL — ABNORMAL LOW (ref 8.9–10.3)
Chloride: 96 mmol/L — ABNORMAL LOW (ref 98–111)
Creatinine, Ser: 1.11 mg/dL (ref 0.61–1.24)
GFR calc Af Amer: >60 mL/min (ref >60)
GFR calc non Af Amer: >60 mL/min (ref >60)
Glucose, Bld: 302 mg/dL — ABNORMAL HIGH (ref 70–99)
Potassium: 3.1 mmol/L — ABNORMAL LOW (ref 3.5–5.1)
Sodium: 134 mmol/L — ABNORMAL LOW (ref 135–145)
Total Bilirubin: 7.8 mg/dL — ABNORMAL HIGH (ref 0.3–1.2)
Total Protein: 4.6 g/dL — ABNORMAL LOW (ref 6.5–8.1)

## 2019-08-15 LAB — ECHOCARDIOGRAM LIMITED
Height: 69 in
Weight: 2871.27 oz

## 2019-08-15 LAB — CBC
HCT: 33.2 % — ABNORMAL LOW (ref 39.0–52.0)
Hemoglobin: 11.1 g/dL — ABNORMAL LOW (ref 13.0–17.0)
MCH: 30.2 pg (ref 26.0–34.0)
MCHC: 33.4 g/dL (ref 30.0–36.0)
MCV: 90.5 fL (ref 80.0–100.0)
Platelets: 83 10*3/uL — ABNORMAL LOW (ref 150–400)
RBC: 3.67 MIL/uL — ABNORMAL LOW (ref 4.22–5.81)
RDW: 17.1 % — ABNORMAL HIGH (ref 11.5–15.5)
WBC: 20.9 10*3/uL — ABNORMAL HIGH (ref 4.0–10.5)
nRBC: 0 % (ref 0.0–0.2)

## 2019-08-15 LAB — POCT I-STAT, CHEM 8
BUN: 32 mg/dL — ABNORMAL HIGH (ref 8–23)
Calcium, Ion: 1.09 mmol/L — ABNORMAL LOW (ref 1.15–1.40)
Chloride: 94 mmol/L — ABNORMAL LOW (ref 98–111)
Creatinine, Ser: 1.3 mg/dL — ABNORMAL HIGH (ref 0.61–1.24)
Glucose, Bld: 147 mg/dL — ABNORMAL HIGH (ref 70–99)
HCT: 37 % — ABNORMAL LOW (ref 39.0–52.0)
Hemoglobin: 12.6 g/dL — ABNORMAL LOW (ref 13.0–17.0)
Potassium: 4.4 mmol/L (ref 3.5–5.1)
Sodium: 137 mmol/L (ref 135–145)
TCO2: 34 mmol/L — ABNORMAL HIGH (ref 22–32)

## 2019-08-15 LAB — RENAL FUNCTION PANEL
Albumin: 1.4 g/dL — ABNORMAL LOW (ref 3.5–5.0)
Anion gap: 9 (ref 5–15)
BUN: 32 mg/dL — ABNORMAL HIGH (ref 8–23)
CO2: 31 mmol/L (ref 22–32)
Calcium: 7.3 mg/dL — ABNORMAL LOW (ref 8.9–10.3)
Chloride: 96 mmol/L — ABNORMAL LOW (ref 98–111)
Creatinine, Ser: 1.17 mg/dL (ref 0.61–1.24)
GFR calc Af Amer: >60 mL/min (ref >60)
GFR calc non Af Amer: >60 mL/min (ref >60)
Glucose, Bld: 141 mg/dL — ABNORMAL HIGH (ref 70–99)
Phosphorus: 3.6 mg/dL (ref 2.5–4.6)
Potassium: 3.8 mmol/L (ref 3.5–5.1)
Sodium: 136 mmol/L (ref 135–145)

## 2019-08-15 LAB — COOXEMETRY PANEL
Carboxyhemoglobin: 1.4 % (ref 0.5–1.5)
Methemoglobin: 1.4 % (ref 0.0–1.5)
O2 Saturation: 58.8 %
Total hemoglobin: 11.3 g/dL — ABNORMAL LOW (ref 12.0–16.0)

## 2019-08-15 LAB — PATHOLOGIST SMEAR REVIEW

## 2019-08-15 LAB — BASIC METABOLIC PANEL
Anion gap: 8 (ref 5–15)
Anion gap: 9 (ref 5–15)
BUN: 27 mg/dL — ABNORMAL HIGH (ref 8–23)
BUN: 30 mg/dL — ABNORMAL HIGH (ref 8–23)
CO2: 29 mmol/L (ref 22–32)
CO2: 31 mmol/L (ref 22–32)
Calcium: 7.1 mg/dL — ABNORMAL LOW (ref 8.9–10.3)
Calcium: 7.5 mg/dL — ABNORMAL LOW (ref 8.9–10.3)
Chloride: 96 mmol/L — ABNORMAL LOW (ref 98–111)
Chloride: 97 mmol/L — ABNORMAL LOW (ref 98–111)
Creatinine, Ser: 1.07 mg/dL (ref 0.61–1.24)
Creatinine, Ser: 1.19 mg/dL (ref 0.61–1.24)
GFR calc Af Amer: >60 mL/min (ref >60)
GFR calc Af Amer: >60 mL/min (ref >60)
GFR calc non Af Amer: >60 mL/min (ref >60)
GFR calc non Af Amer: >60 mL/min (ref >60)
Glucose, Bld: 155 mg/dL — ABNORMAL HIGH (ref 70–99)
Glucose, Bld: 235 mg/dL — ABNORMAL HIGH (ref 70–99)
Potassium: 3.9 mmol/L (ref 3.5–5.1)
Potassium: 4.5 mmol/L (ref 3.5–5.1)
Sodium: 134 mmol/L — ABNORMAL LOW (ref 135–145)
Sodium: 136 mmol/L (ref 135–145)

## 2019-08-15 LAB — VANCOMYCIN, TROUGH: Vancomycin Tr: 19 ug/mL (ref 15–20)

## 2019-08-15 LAB — GLUCOSE, CAPILLARY
Glucose-Capillary: 100 mg/dL — ABNORMAL HIGH (ref 70–99)
Glucose-Capillary: 137 mg/dL — ABNORMAL HIGH (ref 70–99)
Glucose-Capillary: 155 mg/dL — ABNORMAL HIGH (ref 70–99)
Glucose-Capillary: 199 mg/dL — ABNORMAL HIGH (ref 70–99)
Glucose-Capillary: 277 mg/dL — ABNORMAL HIGH (ref 70–99)
Glucose-Capillary: 88 mg/dL (ref 70–99)

## 2019-08-15 LAB — TYPE AND SCREEN
ABO/RH(D): A POS
Antibody Screen: NEGATIVE

## 2019-08-15 LAB — LACTATE DEHYDROGENASE: LDH: 328 U/L — ABNORMAL HIGH (ref 98–192)

## 2019-08-15 LAB — TRIGLYCERIDES: Triglycerides: 234 mg/dL — ABNORMAL HIGH (ref <150)

## 2019-08-15 LAB — MAGNESIUM
Magnesium: 1.6 mg/dL — ABNORMAL LOW (ref 1.7–2.4)
Magnesium: 1.8 mg/dL (ref 1.7–2.4)
Magnesium: 2.4 mg/dL (ref 1.7–2.4)

## 2019-08-15 LAB — HEPARIN LEVEL (UNFRACTIONATED): Heparin Unfractionated: <0.1 IU/mL — ABNORMAL LOW (ref 0.30–0.70)

## 2019-08-15 LAB — PROCALCITONIN: Procalcitonin: 1.86 ng/mL

## 2019-08-15 LAB — PHOSPHORUS: Phosphorus: 3.7 mg/dL (ref 2.5–4.6)

## 2019-08-15 MED ORDER — ASPIRIN 81 MG PO CHEW
81.0000 mg | CHEWABLE_TABLET | Freq: Every day | ORAL | Status: DC
  Administered 2019-08-15 – 2019-10-09 (×54): 81 mg
  Filled 2019-08-15 (×54): qty 1

## 2019-08-15 MED ORDER — POTASSIUM CHLORIDE 10 MEQ/50ML IV SOLN
10.0000 meq | INTRAVENOUS | Status: AC
  Administered 2019-08-15 (×3): 10 meq via INTRAVENOUS
  Filled 2019-08-15: qty 50

## 2019-08-15 MED ORDER — MAGNESIUM SULFATE 4 GM/100ML IV SOLN
4.0000 g | Freq: Once | INTRAVENOUS | Status: AC
  Administered 2019-08-15: 4 g via INTRAVENOUS
  Filled 2019-08-15: qty 100

## 2019-08-15 MED ORDER — MAGNESIUM SULFATE 50 % IJ SOLN
3.0000 g | Freq: Once | INTRAVENOUS | Status: DC
  Filled 2019-08-15: qty 6

## 2019-08-15 MED ORDER — COLLAGENASE 250 UNIT/GM EX OINT
TOPICAL_OINTMENT | Freq: Every day | CUTANEOUS | Status: DC
  Administered 2019-08-16 – 2019-09-21 (×7): 1 via TOPICAL
  Filled 2019-08-15 (×5): qty 30

## 2019-08-15 MED ORDER — PROSOURCE TF PO LIQD
45.0000 mL | Freq: Two times a day (BID) | ORAL | Status: DC
  Administered 2019-08-15 – 2019-08-28 (×24): 45 mL
  Filled 2019-08-15 (×25): qty 45

## 2019-08-15 MED ORDER — POTASSIUM CHLORIDE 20 MEQ/15ML (10%) PO SOLN
40.0000 meq | Freq: Four times a day (QID) | ORAL | Status: AC
  Administered 2019-08-15 (×2): 40 meq
  Filled 2019-08-15 (×2): qty 30

## 2019-08-15 MED ORDER — SODIUM CHLORIDE 0.9 % IV SOLN
200.0000 mg | Freq: Once | INTRAVENOUS | Status: AC
  Administered 2019-08-15: 200 mg via INTRAVENOUS
  Filled 2019-08-15: qty 200

## 2019-08-15 MED ORDER — POTASSIUM CHLORIDE 20 MEQ/15ML (10%) PO SOLN
40.0000 meq | Freq: Once | ORAL | Status: AC
  Administered 2019-08-15: 40 meq
  Filled 2019-08-15: qty 30

## 2019-08-15 MED ORDER — POTASSIUM CHLORIDE 10 MEQ/50ML IV SOLN
10.0000 meq | INTRAVENOUS | Status: AC
  Administered 2019-08-15 (×3): 10 meq via INTRAVENOUS
  Filled 2019-08-15 (×2): qty 50

## 2019-08-15 MED ORDER — ACETAMINOPHEN 10 MG/ML IV SOLN
1000.0000 mg | Freq: Four times a day (QID) | INTRAVENOUS | Status: AC
  Administered 2019-08-15 – 2019-08-16 (×4): 1000 mg via INTRAVENOUS
  Filled 2019-08-15 (×4): qty 100

## 2019-08-15 MED ORDER — LEVALBUTEROL HCL 1.25 MG/0.5ML IN NEBU
1.2500 mg | INHALATION_SOLUTION | Freq: Four times a day (QID) | RESPIRATORY_TRACT | Status: DC
  Administered 2019-08-15 – 2019-08-26 (×45): 1.25 mg via RESPIRATORY_TRACT
  Filled 2019-08-15 (×46): qty 0.5

## 2019-08-15 MED ORDER — ROSUVASTATIN CALCIUM 20 MG PO TABS
20.0000 mg | ORAL_TABLET | Freq: Every day | ORAL | Status: DC
  Administered 2019-08-15 – 2019-08-17 (×3): 20 mg
  Filled 2019-08-15 (×3): qty 1

## 2019-08-15 MED ORDER — SODIUM CHLORIDE 0.9 % IV SOLN
100.0000 mg | INTRAVENOUS | Status: DC
  Administered 2019-08-16 – 2019-08-22 (×7): 100 mg via INTRAVENOUS
  Filled 2019-08-15 (×7): qty 100

## 2019-08-15 MED ORDER — POTASSIUM CHLORIDE 10 MEQ/50ML IV SOLN
INTRAVENOUS | Status: AC
  Filled 2019-08-15: qty 150

## 2019-08-15 MED ORDER — PIVOT 1.5 CAL PO LIQD
1000.0000 mL | ORAL | Status: DC
  Administered 2019-08-15 – 2019-08-27 (×13): 1000 mL
  Filled 2019-08-15 (×21): qty 1000

## 2019-08-15 NOTE — Consult Note (Signed)
WOC Nurse wound follow up Patient receiving care in [facility] 2H2.  Assisted with turning for sacral wound assessment by unit RNs. 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  And, antifungal powder to buttocks each shift. Helmut Muster, RN, MSN, CWOCN, CNS-BC, pager [phone]

## 2019-08-15 NOTE — Progress Notes (Addendum)
Patient ID: Todd [name], male   DOB: 1955/05/15, 64 y.o.   MRN: [id number]     Advanced Heart Failure Rounding Note  PCP-Cardiologist: No primary care provider on file.   Subjective:    08/02/19 CABG 08/03/19 VF arrest. Chest open at bedside 08/03/19 Back to OR for ECMO cannulation 08/03/19 Washout out for tamponade 08/04/19 Repeat bedside washout for tamponade 08/06/19 To OR for decannulation and Impella 5.5 08/07/19 Underwent emergent bedside chest exploration at bedside for tamponade  08/10/19 Chest closed  Remains on vent and Impella 5.5, now at P4.  On heparin gtt. LDH 328.   Dusky fingers on right hand, arterial line out.  Epinephrine 3, milrinone 0.125, NE 26, vasopressin 0.04.  MAP stable.  Co-ox 59%.   Decreased ventricular ectopy.  Remains on amiodarone 60 mg/hr.  K has been very difficult to replace, 3.1 this morning.  Lasix gtt currently at 8 mg/hr, I>O still but weight down.  CVP 12.   CXR with persistent bibasilar infiltrates, Tm 100.6, WBCs 26=>21.   Ileus, improving.  Now getting trickle feeds via post-pyloric tube.  Still on TPN.   Impella 5.5 P-4 Waveforms ok Flow 2.3 No alarms  Objective:   Weight Range: 81.4 kg Body mass index is 26.5 kg/m.   Vital Signs:   Temp:  [99.9 F (37.7 C)-100.8 F (38.2 C)] 99.9 F (37.7 C) (07/07 0700) Pulse Rate:  [78-95] 81 (07/07 0700) Resp:  [14-29] 29 (07/07 0700) BP: (80-127)/(56-74) 90/59 (07/07 0700) SpO2:  [93 %-98 %] 95 % (07/07 0700) FiO2 (%):  [50 %] 50 % (07/07 0300) Weight:  [81.4 kg] 81.4 kg (07/07 0200) Last BM Date: 08/12/19  Weight change: Filed Weights   08/12/19 0500 08/13/19 0500 08/15/19 0200  Weight: 83.2 kg 83.6 kg 81.4 kg    Intake/Output:   Intake/Output Summary (Last 24 hours) at 08/15/2019 0723 Last data filed at 08/15/2019 0700 Gross per 24 hour  Intake 6138.84 ml  Output 4795 ml  Net 1343.84 ml      Physical Exam    General: NAD Neck: JVP 10-12 cm, no thyromegaly or thyroid  nodule.  Lungs: Decreased BS at bases.  CV: Nondisplaced PMI.  Heart regular S1/S2, no S3/S4, no murmur.  1+ edema to thighs.    Abdomen: Soft, nontender, no hepatosplenomegaly, no distention.  Skin: Intact without lesions or rashes.  Neurologic: Sedated on vent Extremities: Mild cyanosis of fingers right hand.  HEENT: Normal.     Telemetry   Looks like type 1 2nd degree AVB, decreased ventricular ectopy Personally reviewed   Labs    CBC Recent Labs    08/13/19 0431 08/13/19 0659 08/14/19 0413 08/15/19 0520  WBC 22.3*   < > 25.7* 20.9*  NEUTROABS 19.4*  --   --   --   HGB 11.0*   < > 11.2* 11.1*  HCT 32.3*   < > 33.9* 33.2*  MCV 86.8   < > 89.2 90.5  PLT 122*   < > 104* 83*   < > = values in this interval not displayed.   Basic Metabolic Panel Recent Labs    08/14/19 1519 08/14/19 1519 08/14/19 2330 08/15/19 0520  NA 133*   < > 134* 134*  K 2.9*   < > 3.9 3.1*  CL 93*   < > 96* 96*  CO2 30   < > 29 29  GLUCOSE 125*   < > 235* 302*  BUN 21   < > 27* 26*  CREATININE 0.96   < > 1.07 1.11  CALCIUM 6.8*   < > 7.1* 7.1*  MG  --   --  1.8 1.6*  PHOS 3.3  --   --  3.7   < > = values in this interval not displayed.   Liver Function Tests Recent Labs    08/14/19 0413 08/14/19 0413 08/14/19 1519 08/15/19 0520  AST 133*  --   --  143*  ALT 47*  --   --  55*  ALKPHOS 121  --   --  115  BILITOT 7.8*  --   --  7.8*  PROT 4.6*  --   --  4.6*  ALBUMIN 1.5*   < > 1.3* 1.3*   < > = values in this interval not displayed.   No results for input(s): LIPASE, AMYLASE in the last 72 hours. Cardiac Enzymes No results for input(s): CKTOTAL, CKMB, CKMBINDEX, TROPONINI in the last 72 hours.  BNP: BNP (last 3 results) Recent Labs    07/26/19 1236  BNP 2,568.2*    ProBNP (last 3 results) No results for input(s): PROBNP in the last 8760 hours.   D-Dimer No results for input(s): DDIMER in the last 72 hours. Hemoglobin A1C No results for input(s): HGBA1C in the  last 72 hours. Fasting Lipid Panel Recent Labs    08/15/19 0520  TRIG 234*   Thyroid Function Tests No results for input(s): TSH, T4TOTAL, T3FREE, THYROIDAB in the last 72 hours.  Invalid input(s): FREET3  Other results:   Imaging    DG Abd 1 View  Result Date: 08/14/2019 CLINICAL DATA:  Feeding tube placement. EXAM: ABDOMEN - 1 VIEW COMPARISON:  08/13/2019 FINDINGS: Feeding tube tip noted projected over the mid abdomen. This is most likely transverse portion of the duodenum. Small amount of adjacent intra lung contrast noted. One image obtained. 2 minutes 50 seconds fluoroscopy time utilized. IMPRESSION: Feeding tube noted with tip most likely in the transverse portion of the duodenum. Electronically Signed   By: Marcello Moores  Register   On: 08/14/2019 13:05   DG CHEST PORT 1 VIEW  Result Date: 08/15/2019 CLINICAL DATA:  Chest tube EXAM: PORTABLE CHEST 1 VIEW COMPARISON:  Yesterday FINDINGS: Endotracheal tube tip is at the clavicular heads. Feeding and gastric suction tubes reaches the stomach. Left PICC with tip at the SVC. Left subclavian sheath in place. Impella in stable position.Chest tubes in place. Hazy opacity from atelectasis and pleural effusions. No visible pneumothorax. Stable heart size. IMPRESSION: Stable hardware positioning, atelectasis, and pleural fluid. No visible pneumothorax. Electronically Signed   By: Monte Fantasia M.D.   On: 08/15/2019 07:08     Medications:     Scheduled Medications: . artificial tears   Both Eyes Q8H  . aspirin  300 mg Rectal Daily  . chlorhexidine gluconate (MEDLINE KIT)  15 mL Mouth Rinse BID  . Chlorhexidine Gluconate Cloth  6 each Topical Daily  . docusate  200 mg Per Tube Daily  . fentaNYL (SUBLIMAZE) injection  50 mcg Intravenous Once  . insulin aspart  0-24 Units Subcutaneous Q4H  . insulin detemir  10 Units Subcutaneous BID  . ipratropium  0.5 mg Nebulization Q6H  . levalbuterol  1.25 mg Nebulization Q6H  . mouth rinse  15 mL  Mouth Rinse 10 times per day  . metoCLOPramide (REGLAN) injection  10 mg Intravenous Q8H  . pantoprazole (PROTONIX) IV  40 mg Intravenous QHS  . potassium chloride  40 mEq Per Tube Q6H  .  sodium chloride flush  10-40 mL Intracatheter Q12H  . sodium chloride flush  10-40 mL Intracatheter Q12H  . sodium chloride flush  3 mL Intravenous Q12H    Infusions: . sodium chloride 250 mL (08/13/19 1412)  . sodium chloride Stopped (08/13/19 1631)  . amiodarone 60 mg/hr (08/15/19 0700)  . epinephrine 3 mcg/min (08/15/19 0700)  . feeding supplement (PIVOT 1.5 CAL) 20 mL/hr at 08/15/19 0700  . fentaNYL infusion INTRAVENOUS 225 mcg/hr (08/15/19 0700)  . furosemide (LASIX) infusion 8 mg/hr (08/15/19 0700)  . impella catheter heparin 50 unit/mL in dextrose 5%    . heparin 200 Units/hr (08/15/19 0700)  . lactated ringers 20 mL/hr at 08/10/19 1705  . lactated ringers 20 mL/hr at 08/15/19 0700  . magnesium sulfate bolus IVPB    . meropenem (MERREM) IV Stopped (08/15/19 0610)  . midazolam 2 mg/hr (08/15/19 0700)  . milrinone 0.125 mcg/kg/min (08/15/19 0700)  . norepinephrine (LEVOPHED) Adult infusion 26 mcg/min (08/15/19 0700)  . potassium chloride 10 mEq (08/15/19 0709)  . potassium chloride    . potassium chloride    . propofol (DIPRIVAN) infusion Stopped (08/13/19 1420)  . TPN ADULT (ION) 55 mL/hr at 08/15/19 0700  . vancomycin Stopped (08/14/19 1525)  . vasopressin 0.04 Units/min (08/15/19 0700)    PRN Medications: sodium chloride, sodium chloride, dextrose, fentaNYL, metoprolol tartrate, midazolam, morphine injection, ondansetron (ZOFRAN) IV, sodium chloride flush, sodium chloride flush, sodium chloride flush   Assessment/Plan   1. Shock: At this point, think mixed cardiogenic/septic.  Echo with EF 20-25%, moderate RV dysfunction.  VA ECMO post-VF arrest, cannulated 6/25.  Stable s/p return to OR for mediastinal re-exploration due to bleeding on 6/25.  Decannulated from [place] ECMO on 08/06/19.  Impella 5.5 placed. Underwent emergent chest re-exploration on 6/29 for tamponade and clot removal. Chest closed 7/2.  This morning, still on significant pressor/inotrope support with epinephrine 3, NE, 26, milrinone 0.125, vasopressin 0.04. Have not been able to wean. Suspect component of septic/vasodilatory shock as well with fever, elevated WBCs.  Co-ox 59%, CVP 12 with I>O but weight down.  Lasix at 8 mg/hr.  Impella 5.5 at P4, LDH stable.  - Continue current pressors/inotropes, wean as able today. Will wean sedation today to see if this will help with pressor wean.  - Continue Impella at P4.  He is now on heparin gtt. Check position by echo today => stable at 5.3 cm on limited echo today.  - Difficult situation with volume overload but difficult to maintain K and having NSVT => K better today, will continue aggressive repletion and can now give K per tube. Increase Lasix to 12 mg/hr.  Will also stop TPN as this may help with volume.  2. 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  Prolonged VF arrest on 6/25. Chest closed 7/2.  - ASA and Crestor.  3. Cardiac arrest: VF arrest with prolonged code. Still with episodes of VT/NSVT in setting of low K (difficult to effectively replace).  - Aggressive K replacement as above, recheck in pm.   - Continue IV amiodarone at 60 mg/hr for now.   - Eventual evaluation for ICD or lifevest at d/c - Keep K > 4.0 Mg > 2.0 4. Acute hypoxemic respiratory failure: In setting of cardiac arrest.  H/o COPD.  Bibasilar infiltrates, possible HCAP.   - On vent, CCM following, suspect will need trach.   - Getting vancomycin/meropenem for possible PNA, will add [name] with yeast in  trach aspirate.   5. Acute blood loss anemia: Now s/p mediastinal re-exploration x 3. Hgb stable today.  CT drainage has slowed.   - Now on heparin gtt. 6. Thrombocytopenia: 81 this morning, suspect due to critical illness rather than HIT. LDH stable so do not  think significant hemolysis.  7. ID: Suspect HCAP, on meropenem/vancomycin.  WBCs 26 => 22 => 26 => 20. Tm 100.6.  Suspect component of septic shock. Cultures negative so far except few Candida in tracheal aspirate. - Will add anidulafungin as above.  - Procalcitonin today.   8. Elevated bilirubin: Mostly direct = likely cholestatic/shock liver/RV failure predominantly.  May have some hemolysis from pump but LDH has been stable 495 -> 469 -> 328.   9. Ileus: Improving.  Now getting tube feeds and think we can stop TPN.  10. Atrial fibrillation: Paroxysmal.  Probably NSR today.  - Continue amiodarone gtt.  - Heparin gtt.   CRITICAL CARE Performed by: Loralie Champagne  Total critical care time: 45 minutes at bedside  Critical care time was exclusive of separately billable procedures and treating other patients.  Critical care was necessary to treat or prevent imminent or life-threatening deterioration.  Critical care was time spent personally by me on the following activities: development of treatment plan with patient and/or surrogate as well as nursing, discussions with consultants, evaluation of patient's response to treatment, examination of patient, obtaining history from patient or surrogate, ordering and performing treatments and interventions, ordering and review of laboratory studies, ordering and review of radiographic studies, pulse oximetry and re-evaluation of patient's condition.   Length of Stay: [place], MD  08/15/2019, 7:23 AM  Advanced Heart Failure Team Pager [phone] (M-F; [place])  Please contact [facility] for night-coverage after hours (4p -7a ) and weekends on [email or web address]

## 2019-08-15 NOTE — Progress Notes (Signed)
  Echocardiogram 2D Echocardiogram has been performed.  Leta Jungling M 08/15/2019, 9:54 AM

## 2019-08-15 NOTE — Progress Notes (Signed)
Nutrition Follow-up  DOCUMENTATION CODES:   Non-severe (moderate) malnutrition in context of acute illness/injury  INTERVENTION:   Tube Feeding:  Pivot 1.5 at 60 ml/hr Recommend titrating by 10 mL q 8 hours until goal rate of 60 ml/hr Prosource TF 45 mL BID Provides 157 g of protein, 2240 kcals, 1094 mL of free water Meets 100% of estimated calorie and protein needs   NUTRITION DIAGNOSIS:   Moderate Malnutrition related to acute illness as evidenced by mild fat depletion, mild muscle depletion, moderate muscle depletion.  Being addressed via TF   GOAL:   Patient will meet greater than or equal to 90% of their needs  Progressing  MONITOR:   Vent status, TF tolerance, Labs, Weight trends  REASON FOR ASSESSMENT:   Consult  (EMCO tube feeding recommendations)  ASSESSMENT:   Patient with PMH significant for CHF, COPD, HTN, DM, HLD, MDD, and BPH. Presents this admission with CHF exacerbation.  6/24- s/p CABG x4 6/25- VT arrest, emergent sternotomy, VA ECMO cannulation 6/28- De-cannulated, Impella placed 6/29-Bedside Mediastinal Exploration 2/2 tamponade with multiple blood clots removed 7/02 OR for sternal closure with application of wound vac, Cortrak placed but post-pyloric placement unsuccessful 7/03 TF stared at 20 ml/hr but pt with emesis, OG to LWS with 850 mL out 7/04 TPN started at 30 ml/hr 7/05 TPN  remains at 30 ml/hr 7/06 TPN at 55 ml/hr, IR placed 10 fr post-pyloric feeding tube. Trickle TF initiated 7/07 TF titration, TPN discontinued  Pt remains on vent support  Tolerated Trickle TF of Pivot 1.5 at 20 ml/hr via 10 fr post-pyloric small bore feeding tube  TPN currently at 55 ml/hr, plan to d/c TPN. TPN at 55 ml/hr provided 95 g of protein, 1501 kcals meeting 80% of nutritional needs.   Current weight: 81.4 kg Admit weight: 79.4 kg:  Lowest weight: 73 kg Net I/O: +16.8 L since admission, good UOP at present  Unsure of actual dry weight; plan to  utilize 73 kg at present  WOC RN evaluated sacral wound which has evolved from DTI to unstageable PI  Labs: sodium 134 (L), potassium 3.1 (L) Meds: lasix drip, ss novolog, levemir, reglan, KCl   Diet Order:   Diet Order            Diet NPO time specified  Diet effective now                 EDUCATION NEEDS:   Education needs have been addressed  Skin:  Skin Assessment: Skin Integrity Issues: Skin Integrity Issues:: Unstageable DTI: n/a Unstageable: Sacrum (DTI evolved to [facility] RN following Incisions: R leg, R groin Other: open chest  Last BM:  7/4 rectal tube  Height:   Ht Readings from Last 1 Encounters:  08/10/19 5\' 9"  (1.753 m)    Weight:   Wt Readings from Last 1 Encounters:  08/15/19 81.4 kg    BMI:  Body mass index is 26.5 kg/m.  Estimated Nutritional Needs:   Kcal:  10/16/19 kcals  Protein:  130-160 g  Fluid:  >/= 1.8 L/day   [id number] MS, RDN, LDN, CNSC Registered Dietitian III Clinical Nutrition RD Pager and On-Call Pager Number Located in [place]

## 2019-08-15 NOTE — Progress Notes (Signed)
ANTICOAGULATION CONSULT NOTE - Follow Up Consult  Pharmacy Consult for heparin Indication: ECMO > impella  Labs: Recent Labs    08/13/19 0431 08/13/19 0659 08/14/19 0226 08/14/19 0226 08/14/19 0413 08/14/19 0413 08/14/19 1519 08/14/19 2330 08/15/19 0520  HGB 11.0*   < > 12.9*   < > 11.2*  --   --   --  11.1*  HCT 32.3*   < > 38.0*  --  33.9*  --   --   --  33.2*  PLT 122*  --   --   --  104*  --   --   --  83*  APTT 39*  --   --   --   --   --   --   --   --   HEPARINUNFRC <0.10*  --   --   --  <0.10*  --   --   --  <0.10*  CREATININE 1.14   < >  --    < > 1.02   < > 0.96 1.07 1.11   < > = values in this interval not displayed.    Assessment: 64yo male s/p ECMO, now on Impella. Chest closed 7/2. CT drainage decreased.  Hgb stable 11.1, pltc 83. Last INR 1.2 6/29  Heparin level remains undetectable, on 305 units/hr (purge flow 6.1 mL/hr) from purge solution and 200 units/hr systemically.  New central line placed and new PICC line placed 7/5. Trach planned for later in week.   Plan: Continue heparin in purge solution  Start systemic heparin infusion at 200 units/hr - no titration at this time Daily heparin level and CBC.  Sherron Monday, PharmD, BCCCP Clinical Pharmacist  Phone: [phone] 08/15/2019 7:12 AM  Please check AMION for all [facility] Pharmacy phone numbers After 10:00 PM, call Main Pharmacy [phone]

## 2019-08-15 NOTE — Progress Notes (Signed)
TCTS Evening rounds  POD #13 s/p CABG Remains critically ill Febrile today Tbili 7+ A/P: pan culture RUQ u/s to r/o acalculous cholecystitis  Rochell Mabie Z. Vickey Sages, MD [phone]

## 2019-08-15 NOTE — Plan of Care (Signed)
°  Problem: Cardiac: Goal: Will achieve and/or maintain hemodynamic stability Outcome: Progressing   Problem: Respiratory: Goal: Respiratory status will improve Outcome: Not Progressing   Problem: Skin Integrity: Goal: Wound healing without signs and symptoms of infection Outcome: Not Progressing   Problem: Urinary Elimination: Goal: Ability to achieve and maintain adequate renal perfusion and functioning will improve Outcome: Progressing

## 2019-08-15 NOTE — Progress Notes (Signed)
5 Days Post-Op Procedure(s) (LRB): STERNAL CLOSURE (N/A) TRANSESOPHAGEAL ECHOCARDIOGRAM (TEE) (N/A) APPLICATION OF WOUND VAC (N/A) Subjective: Intubated, sedated   Objective: Vital signs in last 24 hours: Temp:  [99.9 F (37.7 C)-100.8 F (38.2 C)] 99.9 F (37.7 C) (07/07 0700) Pulse Rate:  [78-95] 81 (07/07 0700) Cardiac Rhythm: Atrial fibrillation (07/07 0000) Resp:  [14-29] 29 (07/07 0700) BP: (80-127)/(56-74) 90/59 (07/07 0700) SpO2:  [93 %-98 %] 95 % (07/07 0700) FiO2 (%):  [50 %] 50 % (07/07 0300) Weight:  [81.4 kg] 81.4 kg (07/07 0200)  Hemodynamic parameters for last 24 hours: CVP:  [10 mmHg-15 mmHg] 13 mmHg  Intake/Output from previous day: 07/06 0701 - 07/07 0700 In: 6138.8 [I.V.:4563.5; NG/GT:312; IV Piggyback:1129.4] Out: 4795 [Urine:3795; Emesis/NG output:300; Chest Tube:700] Intake/Output this shift: No intake/output data recorded.  General appearance: intubated Heart: regular rate and rhythm Lungs: rhonchi bilaterally Abdomen: normal findings: nondistended Extremities: right fingertips unchanged Wound: clean and dry  Lab Results: Recent Labs    08/14/19 0413 08/15/19 0520  WBC 25.7* 20.9*  HGB 11.2* 11.1*  HCT 33.9* 33.2*  PLT 104* 83*   BMET:  Recent Labs    08/14/19 2330 08/15/19 0520  NA 134* 134*  K 3.9 3.1*  CL 96* 96*  CO2 29 29  GLUCOSE 235* 302*  BUN 27* 26*  CREATININE 1.07 1.11  CALCIUM 7.1* 7.1*    PT/INR: No results for input(s): LABPROT, INR in the last 72 hours. ABG    Component Value Date/Time   PHART 7.370 08/14/2019 0226   HCO3 34.7 (H) 08/14/2019 0226   TCO2 36 (H) 08/14/2019 0226   ACIDBASEDEF 1.0 08/08/2019 1115   O2SAT 58.8 08/15/2019 0520   CBG (last 3)  Recent Labs    08/14/19 1941 08/14/19 2332 08/15/19 0530  GLUCAP 167* 238* 277*    Assessment/Plan: S/P Procedure(s) (LRB): STERNAL CLOSURE (N/A) TRANSESOPHAGEAL ECHOCARDIOGRAM (TEE) (N/A) APPLICATION OF WOUND VAC (N/A) -Remains critically  ill  CV- in SR, ectopy decreased over past 48 hours  Coox= 59, CVP= 13  Continue to try to wean drips, continue Impella  Amiodarone at 60 mg/hr  On IV heparin RESP- VDRF. Vent per CCM  Will likely need trach but little room to sternal incision, will hold off a little longer RENAL- creatinine Ok.  Hypokalemia- supplement K  Continue diuresis GI- tolerating postpyloric Tf. Agree with increasing Tf and stopping TNA ID- febrile and WBC elevated, yeast in sputum. On vanco and meropenem. Will add [name] ENDO- CBG elevated, stopping TNA should help  Continue SSI Thrombocytopenia- doubt HIT but will check for Ab   LOS: 20 days    [name] 08/15/2019

## 2019-08-15 NOTE — Progress Notes (Addendum)
NAME:  Todd [name], MRN:  [id number], DOB:  19-Aug-1955, LOS: 20 ADMISSION DATE:  07/26/2019, CONSULTATION DATE:  08/03/19 REFERRING MD:  Dorris Fetch, CHIEF COMPLAINT:  ECMO   Brief History   VA ECMO for post CABG Vtach arrest  History of present illness   Presented with worsening dyspnea 6/17 c/w CHF exacerbation Cath with severe triple vessel disease Underwent CABG 6/24 Early this AM Vtach arrest Chest opened bedside and cardiac massage initiated as well as multiple cardioversions, amiodarone, bicarb etc Brought to OR and cannulated for VA ECMO PCCM consulted to assist with management Comorbidities include DM, heavy smoking, COPD  Past Medical History  Depression Ischemic cardiomyopathy HTN HLD  Significant Hospital Events   6/24 CABG 6/25 VA cannulation 6/28 decannulated and impella placed 6/29 bedside re-exploration; s/p decannulation. Placement of impella device originally at p8 but decreased to p4 2/2 suction events overnight up to p6. Echo completed and repositioned device. Remained on considerable support with 8 epi and 46 norepi vaso 0.05. continued chest tube output with large clots noted. Heparin thru device. Replacement products ongoing. 60% 8 peep 6/30: bedside mediastinal re-exploration and clean out of hematoma with tamponade and worsening hemodynamics. Pt had large volume transfusion. rebolused with amio 2/2 nsvt episode.  Weight up 48 pounds.  Started diuresis, Lasix drip started. 7/1 iatrogenic respiratory alkalosis, vent rate decreased. 7/2: No significant issues overnight remains on pressors and Impella not tolerating tube feeds with high gastric output awaiting core track placement 7/3: started trickle TF  7/5: Swab removed, CVL and PICC placed.   Consults:  CHF, PCCM, TCTS  Procedures:  See below  Significant Diagnostic Tests:  6/22 spirometry with restrictive physiology, preserved FEV1/FVC CXR today with bilateral airspace disease, cannulas in good  position, deep left sulcus that was present yesterday, no clear PTX 6/18 echo: LVEF 20-25%, grade I diastolic dysfunction  Micro Data:  COVID and HIV Neg  Antimicrobials:  Vanc6/24>> Cefuroxime 6/24>> 6/25 merrem 6/26->  Interim history/subjective:   No acute events overnight. Patient intubated and sedated .   Objective   Blood pressure 94/64, pulse 89, temperature 99.9 F (37.7 C), temperature source Core (Comment), resp. rate 20, height 5\' 9"  (1.753 m), weight 81.4 kg, SpO2 95 %. CVP:  [10 mmHg-15 mmHg] 13 mmHg  Vent Mode: PRVC FiO2 (%):  [50 %] 50 % Set Rate:  [20 bmp] 20 bmp Vt Set:  [420 mL] 420 mL PEEP:  [8 cmH20] 8 cmH20 Plateau Pressure:  [22 cmH20] 22 cmH20   Intake/Output Summary (Last 24 hours) at 08/15/2019 0850 Last data filed at 08/15/2019 0800 Gross per 24 hour  Intake 6160.85 ml  Output 4965 ml  Net 1195.85 ml   Filed Weights   08/12/19 0500 08/13/19 0500 08/15/19 0200  Weight: 83.2 kg 83.6 kg 81.4 kg    Examination:  General: elderly male, sedated , on vent HEENT: pupil reactive to light, trachea midline Cardiovascular: Normal rate, regular rhythm.  No murmurs, rubs, or gallops Chest: midline chest wound with wound vac Pulmonary : BL vented breath sounds Abdominal: soft, nontender Ext: dusky right finger tips, other ext well perfused  Skin: Warm, dry   Review labs: Mg 1.6 , Phos 3.9, Albumin 1.3, Ca corrected 9.23, total Billi 7.8, AST 143, ALT 55  Resolved Hospital Problem list   N/A  Assessment & Plan:   Severe coronary artery disease w/ severe ICM status post CABG complicated by VT arrest, cardiogenic shock and hemorrhagic shock:  EF 20 to 25% with severe  LV dysfunction and moderate RV dysfunctionl  -VA ECMO; now decannulated -Impella device initiated 6/28 -Has required reexploration x3 of the chest as well as massive volume of blood transfusion - Continues to need pressor/ionotrope support, On levo, milrinone , vasopressin , impella P-4    - Plan to wean sedation today  Plan Amiodarone  Asa+statin IV heparin impella and pressor wean per AHF service   Sedation needs - propofol stopped 7/5 - versed stopped 7/7 - wean fentanyl as mentioned above  Acute hypoxic respiratory failure w/ need for mechanical ventilation   On vent, potential need for trach was discussed with teams on 7/5. Cxray shows continued r pleural effusion and bilateral pulmonary edema.  Plan -Continue vent support  - Continuing diuresis with lasix infusion   Fluid and electrolyte balance:  Plan Replete lytes as needed Follow closely Per cardiology   T2DM:  Elevated blood glucose overnight Plan on levemir 10 units BID 7/5 SSI + CBG  Per primary   Leukocytosis.   Trending down , continues to fever.   Plan On Merc+Vancoop Plan stop day 7/4, fevering so continued on, Mero+vanco ( day 12)   Acute blood loss anemia and thrombocytopenia: Hemoglobin  Stable, platelets down from 104  to  83 Plan Observe    Elevated Direct Bilirubin and transaminases  Observe   Nutrition:  Started on TPN 7/5   Best practice:  Diet: TPN Pain/Anxiety/Delirium protocol (if indicated): fentanyl, versed VAP protocol (if indicated): in place DVT prophylaxis: heparin gtt via device GI prophylaxis: PPI Glucose control: see above Mobility: BR Code Status: Full Family Communication: per primary Disposition:  ICU  Thurmon Fair, MD PGY2   Pulmonary critical care attending:  64 year old gentleman status post CABG, severe coronary artery disease complicated by cardiogenic shock reduced ejection fraction acute on chronic systolic heart failure requiring VA ECMO was decannulated on the 28th.  Now with full support including mechanical ventilation Impella and vasopressors.  No longer having pulseless runs of VT.  Stable overnight.  Does have fevers.  Discussed multidisciplinary rounds with advanced heart failure team and cardiothoracic surgery and bedside  nursing this morning.  BP 94/64 (BP Location: Right Arm)   Pulse 89   Temp 99.9 F (37.7 C) (Core (Comment))   Resp 20   Ht 5\' 9"  (1.753 m)   Wt 81.4 kg   SpO2 95%   BMI 26.50 kg/m  General: Elderly male intubated on mechanical life support critically ill HEENT: NCAT, pupils reactive Heart: Regular rhythm S1-S2 Lungs: Bilateral mechanically ventilated breath sounds Abdomen: Nondistended. Extremities: Dependent edema bilaterally  Labs: Reviewed  Assessment: Severe coronary artery disease, ischemic cardiomyopathy Status post CABG complicated by VT arrest and initially on ECMO now decannulated Acute on chronic systolic heart failure reduced ejection fraction, significant RV dysfunction Recurrent episodes of pulseless VT although they are not sustained. Maintained on Impella Acute hypoxemic respiratory failure requiring mechanical ventilation secondary above. Nutrition  Plan: Stop TNA today Continue tube feeds trickle Stop Versed infusion Attempt to wake up if we can Continue amiodarone Aspirin plus statin IV heparin continued Remains on antimicrobials CBGs with SSI, tight glucose control Continue diuresis follow urine output and electrolytes  This patient is critically ill with multiple organ system failure; which, requires frequent high complexity decision making, assessment, support, evaluation, and titration of therapies. This was completed through the application of advanced monitoring technologies and extensive interpretation of multiple databases. During this encounter critical care time was devoted to patient care services described in this  note for 32 minutes.  Josephine Igo, DO [facility] 08/15/2019 10:09 AM

## 2019-08-16 ENCOUNTER — Inpatient Hospital Stay (HOSPITAL_COMMUNITY): Payer: Medicare Other

## 2019-08-16 ENCOUNTER — Encounter (HOSPITAL_COMMUNITY): Payer: Self-pay | Admitting: Thoracic Surgery (Cardiothoracic Vascular Surgery)

## 2019-08-16 DIAGNOSIS — Z9911 Dependence on respirator [ventilator] status: Secondary | ICD-10-CM

## 2019-08-16 DIAGNOSIS — D62 Acute posthemorrhagic anemia: Secondary | ICD-10-CM

## 2019-08-16 LAB — CBC
HCT: 33.2 % — ABNORMAL LOW (ref 39.0–52.0)
Hemoglobin: 11.1 g/dL — ABNORMAL LOW (ref 13.0–17.0)
MCH: 29.4 pg (ref 26.0–34.0)
MCHC: 33.4 g/dL (ref 30.0–36.0)
MCV: 87.8 fL (ref 80.0–100.0)
Platelets: 108 10*3/uL — ABNORMAL LOW (ref 150–400)
RBC: 3.78 MIL/uL — ABNORMAL LOW (ref 4.22–5.81)
RDW: 18.2 % — ABNORMAL HIGH (ref 11.5–15.5)
WBC: 23.4 10*3/uL — ABNORMAL HIGH (ref 4.0–10.5)
nRBC: 0 % (ref 0.0–0.2)

## 2019-08-16 LAB — BASIC METABOLIC PANEL
Anion gap: 10 (ref 5–15)
BUN: 32 mg/dL — ABNORMAL HIGH (ref 8–23)
CO2: 30 mmol/L (ref 22–32)
Calcium: 7.1 mg/dL — ABNORMAL LOW (ref 8.9–10.3)
Chloride: 97 mmol/L — ABNORMAL LOW (ref 98–111)
Creatinine, Ser: 1.26 mg/dL — ABNORMAL HIGH (ref 0.61–1.24)
GFR calc Af Amer: >60 mL/min (ref >60)
GFR calc non Af Amer: 59 mL/min — ABNORMAL LOW (ref >60)
Glucose, Bld: 87 mg/dL (ref 70–99)
Potassium: 3.2 mmol/L — ABNORMAL LOW (ref 3.5–5.1)
Sodium: 137 mmol/L (ref 135–145)

## 2019-08-16 LAB — RENAL FUNCTION PANEL
Albumin: 1.3 g/dL — ABNORMAL LOW (ref 3.5–5.0)
Anion gap: 10 (ref 5–15)
BUN: 38 mg/dL — ABNORMAL HIGH (ref 8–23)
CO2: 31 mmol/L (ref 22–32)
Calcium: 7.2 mg/dL — ABNORMAL LOW (ref 8.9–10.3)
Chloride: 96 mmol/L — ABNORMAL LOW (ref 98–111)
Creatinine, Ser: 1.42 mg/dL — ABNORMAL HIGH (ref 0.61–1.24)
GFR calc Af Amer: 60 mL/min — ABNORMAL LOW (ref >60)
GFR calc non Af Amer: 51 mL/min — ABNORMAL LOW (ref >60)
Glucose, Bld: 236 mg/dL — ABNORMAL HIGH (ref 70–99)
Phosphorus: 3.7 mg/dL (ref 2.5–4.6)
Potassium: 3.4 mmol/L — ABNORMAL LOW (ref 3.5–5.1)
Sodium: 137 mmol/L (ref 135–145)

## 2019-08-16 LAB — POTASSIUM: Potassium: 3.9 mmol/L (ref 3.5–5.1)

## 2019-08-16 LAB — GLUCOSE, CAPILLARY
Glucose-Capillary: 154 mg/dL — ABNORMAL HIGH (ref 70–99)
Glucose-Capillary: 169 mg/dL — ABNORMAL HIGH (ref 70–99)
Glucose-Capillary: 126 mg/dL — ABNORMAL HIGH (ref 70–99)
Glucose-Capillary: 93 mg/dL (ref 70–99)
Glucose-Capillary: 267 mg/dL — ABNORMAL HIGH (ref 70–99)

## 2019-08-16 LAB — LACTATE DEHYDROGENASE: LDH: 370 U/L — ABNORMAL HIGH (ref 98–192)

## 2019-08-16 LAB — COMPREHENSIVE METABOLIC PANEL
ALT: 104 U/L — ABNORMAL HIGH (ref 0–44)
AST: 287 U/L — ABNORMAL HIGH (ref 15–41)
Albumin: 1.4 g/dL — ABNORMAL LOW (ref 3.5–5.0)
Alkaline Phosphatase: 150 U/L — ABNORMAL HIGH (ref 38–126)
Anion gap: 11 (ref 5–15)
BUN: 34 mg/dL — ABNORMAL HIGH (ref 8–23)
CO2: 30 mmol/L (ref 22–32)
Calcium: 7.2 mg/dL — ABNORMAL LOW (ref 8.9–10.3)
Chloride: 97 mmol/L — ABNORMAL LOW (ref 98–111)
Creatinine, Ser: 1.27 mg/dL — ABNORMAL HIGH (ref 0.61–1.24)
GFR calc Af Amer: >60 mL/min (ref >60)
GFR calc non Af Amer: 59 mL/min — ABNORMAL LOW (ref >60)
Glucose, Bld: 172 mg/dL — ABNORMAL HIGH (ref 70–99)
Potassium: 3.6 mmol/L (ref 3.5–5.1)
Sodium: 138 mmol/L (ref 135–145)
Total Bilirubin: 10.3 mg/dL — ABNORMAL HIGH (ref 0.3–1.2)
Total Protein: 4.9 g/dL — ABNORMAL LOW (ref 6.5–8.1)

## 2019-08-16 LAB — PHOSPHORUS: Phosphorus: 4.4 mg/dL (ref 2.5–4.6)

## 2019-08-16 LAB — CORTISOL-AM, BLOOD: Cortisol - AM: 29.7 ug/dL — ABNORMAL HIGH (ref 6.7–22.6)

## 2019-08-16 LAB — PROCALCITONIN: Procalcitonin: 1.99 ng/mL

## 2019-08-16 LAB — APTT
aPTT: 76 seconds — ABNORMAL HIGH (ref 24–36)
aPTT: 77 seconds — ABNORMAL HIGH (ref 24–36)

## 2019-08-16 LAB — MAGNESIUM: Magnesium: 1.8 mg/dL (ref 1.7–2.4)

## 2019-08-16 LAB — HEPARIN LEVEL (UNFRACTIONATED): Heparin Unfractionated: <0.1 IU/mL — ABNORMAL LOW (ref 0.30–0.70)

## 2019-08-16 LAB — TRIGLYCERIDES: Triglycerides: 267 mg/dL — ABNORMAL HIGH (ref <150)

## 2019-08-16 LAB — CULTURE, RESPIRATORY W GRAM STAIN: Gram Stain: NONE SEEN

## 2019-08-16 LAB — COOXEMETRY PANEL
Carboxyhemoglobin: 1.5 % (ref 0.5–1.5)
Methemoglobin: 1.4 % (ref 0.0–1.5)
O2 Saturation: 56.8 %
Total hemoglobin: 11.7 g/dL — ABNORMAL LOW (ref 12.0–16.0)

## 2019-08-16 LAB — HEPARIN INDUCED PLATELET AB (HIT ANTIBODY): Heparin Induced Plt Ab: 2.604 OD — ABNORMAL HIGH (ref 0.000–0.400)

## 2019-08-16 MED ORDER — POTASSIUM CHLORIDE 10 MEQ/50ML IV SOLN
10.0000 meq | INTRAVENOUS | Status: AC
  Administered 2019-08-16 (×2): 10 meq via INTRAVENOUS

## 2019-08-16 MED ORDER — SODIUM CHLORIDE 0.9 % IV SOLN
0.0700 mg/kg/h | INTRAVENOUS | Status: AC
  Administered 2019-08-16: 0.1 mg/kg/h via INTRAVENOUS
  Filled 2019-08-16: qty 250

## 2019-08-16 MED ORDER — POTASSIUM CHLORIDE 20 MEQ/15ML (10%) PO SOLN
40.0000 meq | Freq: Once | ORAL | Status: AC
  Administered 2019-08-16: 40 meq
  Filled 2019-08-16: qty 30

## 2019-08-16 MED ORDER — POTASSIUM CHLORIDE 20 MEQ/15ML (10%) PO SOLN
20.0000 meq | ORAL | Status: AC
  Administered 2019-08-16 (×2): 20 meq
  Filled 2019-08-16: qty 15

## 2019-08-16 MED ORDER — MAGNESIUM SULFATE 2 GM/50ML IV SOLN
2.0000 g | Freq: Once | INTRAVENOUS | Status: AC
  Administered 2019-08-16: 2 g via INTRAVENOUS
  Filled 2019-08-16: qty 50

## 2019-08-16 MED ORDER — DEXTROSE 5 % SOLN FOR IMPELLA PURGE CATHETER
INTRAVENOUS | Status: DC
  Administered 2019-08-16: 500 mL
  Filled 2019-08-16 (×2): qty 1000

## 2019-08-16 MED ORDER — POTASSIUM CHLORIDE 10 MEQ/50ML IV SOLN
INTRAVENOUS | Status: AC
  Administered 2019-08-16: 10 meq via INTRAVENOUS
  Filled 2019-08-16: qty 150

## 2019-08-16 MED ORDER — POTASSIUM CHLORIDE 20 MEQ/15ML (10%) PO SOLN
ORAL | Status: AC
  Administered 2019-08-16: 20 meq
  Filled 2019-08-16: qty 30

## 2019-08-16 MED ORDER — POTASSIUM CHLORIDE 20 MEQ/15ML (10%) PO SOLN
20.0000 meq | Freq: Once | ORAL | Status: AC
  Administered 2019-08-16: 20 meq
  Filled 2019-08-16: qty 15

## 2019-08-16 MED ORDER — VANCOMYCIN HCL IN DEXTROSE 1-5 GM/200ML-% IV SOLN
1000.0000 mg | INTRAVENOUS | Status: DC
  Administered 2019-08-16 – 2019-08-18 (×3): 1000 mg via INTRAVENOUS
  Filled 2019-08-16 (×4): qty 200

## 2019-08-16 NOTE — Progress Notes (Addendum)
ANTICOAGULATION CONSULT NOTE - Follow Up Consult  Pharmacy Consult for heparin Indication: ECMO > impella  Labs: Recent Labs    08/14/19 0413 08/14/19 1519 08/15/19 0520 08/15/19 1200 08/15/19 1214 08/15/19 1445 08/15/19 2338 08/16/19 0450 08/16/19 1512  HGB 11.2*  --  11.1*  --  12.6*  --   --  11.1*  --   HCT 33.9*  --  33.2*  --  37.0*  --   --  33.2*  --   PLT 104*  --  83*  --   --   --   --  108*  --   HEPARINUNFRC <0.10*  --  <0.10*  --   --   --   --  <0.10*  --   CREATININE 1.02   < > 1.11   < > 1.30*   < > 1.26* 1.27* 1.42*   < > = values in this interval not displayed.    Assessment: 64yo male s/p ECMO, now on Impella. Chest closed 7/2. CT drainage decreased.    Has been on low dose IV heparin and through purge for impella. HIT ab sent a few days ago and returned today as 2.6 - positive  Goals of Therapy: APTT 50 - 70 s  Plan: DC all heparin products; add allergy SRA ordered  Add D5 purge solution for impella Bival 0.1 mg/kg/hr Initial aPTT 2000  Daily aPTT CBC  Elmer Sow, PharmD, BCPS, BCCCP Clinical Pharmacist [phone]  Please check AMION for all [facility] Pharmacy numbers  08/16/2019 7:23 PM  8:40 PM Addendum:  Initial aPTT 76 - within goal  Continue current bival rate and recheck aPTT in 2 hrs  10:36 PM Addendum:  APTT remains within goal 77  Therapeutic x 2 so will continue current dose and recheck with am labs SRA pending draw

## 2019-08-16 NOTE — Progress Notes (Addendum)
Pharmacy Antibiotic Note  Todd [name] is a 64 y.o. male admitted on 07/26/2019 s/p CABG c/b cardiac arrest requiring ECMO cannulation> de-cannulated to impella 5.5.  Pt started on vancomycin and meropenem with open chest - chest closed 7/2  - last dose vanc 7/2, last dose meropenem 7/4.  Still having fevers, WBC 23, Scr 1.27 (CrCl 58 mL/min). PCT increasing 1.99. Started on eraxis on 7/7 given was on TPN and few yeast growing in cx - has risk factors for candidemia. Vancomycin trough collected on 7/7 prior to 3rd maintenance dose came back at 19 - therapeutic on high end of goal prior to steady state.  Plan: Reduce vancomycin dose to 1g IV every 24 hours   Restart meropenem 1gm IV q8h Continue [name] 100 mg IV every 24 hours Repeat vancomycin levels at steady state   Height: 5\' 9"  (175.3 cm) Weight: 83.2 kg (183 lb 6.8 oz) IBW/kg (Calculated) : 70.7  Temp (24hrs), Avg:100.4 F (38 C), Min:99.9 F (37.7 C), Max:101.1 F (38.4 C)  Recent Labs  Lab 08/09/19 1228 08/09/19 1621 08/12/19 0454 08/12/19 1617 08/13/19 0431 08/13/19 1442 08/14/19 0413 08/14/19 1519 08/15/19 0520 08/15/19 0520 08/15/19 1200 08/15/19 1214 08/15/19 1445 08/15/19 2338 08/16/19 0450  WBC  --    < > 26.8*  --  22.3*  --  25.7*  --  20.9*  --   --   --   --   --  23.4*  CREATININE  --    < > 1.27*   < > 1.14   < > 1.02   < > 1.11   < > 1.19 1.30* 1.17 1.26* 1.27*  VANCOTROUGH  --   --   --   --   --   --   --   --   --   --   --   --  19  --   --   VANCORANDOM 22  --   --   --   --   --   --   --   --   --   --   --   --   --   --    < > = values in this interval not displayed.    Estimated Creatinine Clearance: 58 mL/min (A) (by C-G formula based on SCr of 1.27 mg/dL (H)).    Allergies  Allergen Reactions  . Empagliflozin     Other reaction(s): diarrhea  . Other     Other reaction(s): Unknown  . Simvastatin     Other reaction(s): diarrhea  . Sitagliptin     Other reaction(s): diarrhea/abd pain     Antimicrobials this admission: Cefuroxime 6/23 > 6/25 Vancomycin 6/24 > 6/24; resume 6/26 > 7/2 Meropenem 6/26 >> 7/4   Dose adjustments this dosing regimen:  6/30 VR 33, 7/1 22 (ke 0.026, T1/2 26.6) - change to vancomycin 1750 mg q36 7/7 VT = 19 (drawn prior to 3rd dose) - reduce to 1g IV every 24 hours  Microbiology results:  7/7 BCx: ngtd  7/5 BCx: ngtd  7/5 Trach aspirate: few candida tropicalis   7/3 Trach aspirate: few candida topicalis  6/23 MRSA PCR: neg   7/23, PharmD, BCCCP Clinical Pharmacist  Phone: [phone] 08/16/2019 11:06 AM  Please check AMION for all [facility] Pharmacy phone numbers After 10:00 PM, call Main Pharmacy [phone]

## 2019-08-16 NOTE — Progress Notes (Signed)
Patient ID: Todd [name], male   DOB: Feb 09, 1955, 64 y.o.   MRN: [id number]     Advanced Heart Failure Rounding Note  PCP-Cardiologist: No primary care provider on file.   Subjective:    08/02/19 CABG 08/03/19 VF arrest. Chest open at bedside 08/03/19 Back to OR for ECMO cannulation 08/03/19 Washout out for tamponade 08/04/19 Repeat bedside washout for tamponade 08/06/19 To OR for decannulation and Impella 5.5 08/07/19 Underwent emergent bedside chest exploration at bedside for tamponade  08/10/19 Chest closed  Remains on vent and Impella 5.5, now at P4. Good position under echo yesterday.  On heparin gtt. LDH 328 => 370.   Epinephrine 3, milrinone 0.125, NE 25, vasopressin 0.04.  MAP stable.  Co-ox 57%.   Decreased ventricular ectopy.  Remains on amiodarone 60 mg/hr.  He looks like he is in atrial flutter this morning.   Lasix gtt currently at 12 mg/hr, I/Os negative with good UOP, CVP 11.   Tm 101.1, WBCs 26=>21=>23.  He is on vancomycin, meropenem, anidulafungin.   Ileus, improving.  Now getting tube feeds via post-pyloric tube.  Off TPN.   Impella 5.5 P-4 Waveforms ok Flow 2.3 No alarms  Objective:   Weight Range: 83.2 kg Body mass index is 27.09 kg/m.   Vital Signs:   Temp:  [99.9 F (37.7 C)-101.1 F (38.4 C)] 100 F (37.8 C) (07/08 0700) Pulse Rate:  [83-99] 96 (07/08 0700) Resp:  [14-33] 30 (07/08 0700) BP: (84-120)/(55-76) 120/70 (07/08 0700) SpO2:  [90 %-96 %] 92 % (07/08 0745) FiO2 (%):  [40 %-60 %] 40 % (07/08 0745) Weight:  [83.2 kg] 83.2 kg (07/08 0500) Last BM Date: 08/12/19  Weight change: Filed Weights   08/13/19 0500 08/15/19 0200 08/16/19 0500  Weight: 83.6 kg 81.4 kg 83.2 kg    Intake/Output:   Intake/Output Summary (Last 24 hours) at 08/16/2019 0751 Last data filed at 08/16/2019 0700 Gross per 24 hour  Intake 5165.18 ml  Output 5715 ml  Net -549.82 ml      Physical Exam    General: Intubated Neck: JVP 10 cm, no thyromegaly or thyroid  nodule.  Lungs: Decreased BS dependently CV: Nondisplaced PMI.  Heart irregular S1/S2, no S3/S4, no murmur.  No peripheral edema.   Abdomen: Soft, nontender, no hepatosplenomegaly, no distention.  Skin: Intact without lesions or rashes.  Neurologic: Sedated on vent Extremities: No clubbing or cyanosis.  HEENT: Normal.    Telemetry   Appears to be atrial flutter rate around 90 Personally reviewed   Labs    CBC Recent Labs    08/15/19 0520 08/15/19 0520 08/15/19 1214 08/16/19 0450  WBC 20.9*  --   --  23.4*  HGB 11.1*   < > 12.6* 11.1*  HCT 33.2*   < > 37.0* 33.2*  MCV 90.5  --   --  87.8  PLT 83*  --   --  108*   < > = values in this interval not displayed.   Basic Metabolic Panel Recent Labs    08/15/19 1200 08/15/19 1214 08/15/19 1445 08/15/19 1445 08/15/19 2338 08/16/19 0450  NA 136   < > 136   < > 137 138  K 4.5   < > 3.8   < > 3.2* 3.6  CL 97*   < > 96*   < > 97* 97*  CO2 31   < > 31   < > 30 30  GLUCOSE 155*   < > 141*   < > 87  172*  BUN 30*   < > 32*   < > 32* 34*  CREATININE 1.19   < > 1.17   < > 1.26* 1.27*  CALCIUM 7.5*   < > 7.3*   < > 7.1* 7.2*  MG 2.4  --   --   --   --  1.8  PHOS  --   --  3.6  --   --  4.4   < > = values in this interval not displayed.   Liver Function Tests Recent Labs    08/15/19 0520 08/15/19 0520 08/15/19 1445 08/16/19 0450  AST 143*  --   --  287*  ALT 55*  --   --  104*  ALKPHOS 115  --   --  150*  BILITOT 7.8*  --   --  10.3*  PROT 4.6*  --   --  4.9*  ALBUMIN 1.3*   < > 1.4* 1.4*   < > = values in this interval not displayed.   No results for input(s): LIPASE, AMYLASE in the last 72 hours. Cardiac Enzymes No results for input(s): CKTOTAL, CKMB, CKMBINDEX, TROPONINI in the last 72 hours.  BNP: BNP (last 3 results) Recent Labs    07/26/19 1236  BNP 2,568.2*    ProBNP (last 3 results) No results for input(s): PROBNP in the last 8760 hours.   D-Dimer No results for input(s): DDIMER in the last 72  hours. Hemoglobin A1C No results for input(s): HGBA1C in the last 72 hours. Fasting Lipid Panel Recent Labs    08/16/19 0450  TRIG 267*   Thyroid Function Tests No results for input(s): TSH, T4TOTAL, T3FREE, THYROIDAB in the last 72 hours.  Invalid input(s): FREET3  Other results:   Imaging    ECHOCARDIOGRAM LIMITED  Result Date: 08/15/2019    ECHOCARDIOGRAM LIMITED REPORT   Patient Name:   Todd [name] Date of Exam: 08/15/2019 Medical Rec #:  [id number]  Height:       69.0 in Accession #:    [id number] Weight:       179.5 lb Date of Birth:  [birth year] BSA:          1.973 m Patient Age:    38 years   BP:           94/64 mmHg Patient Gender: M          HR:           89 bpm. 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 CHF, CAD, Prior CABG; Risk                 Factors:Hypertension, Dyslipidemia and Diabetes.  Sonographer:    Darlina Sicilian RDCS Referring Phys: [place] Comments: Limited for Impella check IMPRESSIONS  1. Impellar device about 5.1 cm distal to the aortic annulus Severe decrease in LV function. FINDINGS  Additional Comments: Impellar device about 5.1 cm distal to the aortic annulus Severe decrease in LV function. Jenkins Rouge MD Electronically signed by Jenkins Rouge MD Signature Date/Time: 08/15/2019/10:05:45 AM    Final    US Abdomen Limited RUQ  Result Date: 08/16/2019 CLINICAL DATA:  Assess for cholecystitis EXAM: ULTRASOUND ABDOMEN LIMITED RIGHT UPPER QUADRANT COMPARISON:  CT abdomen pelvis 07/30/2014 FINDINGS: Gallbladder: Gallbladder the gallbladder contains biliary sludge and few echogenic, shadowing gallstones, largest measuring up to 6 mm in diameter. No pericholecystic fluid. Sonographic Percell Miller sign is reportedly negative. Common bile duct: Diameter: 4.5 mm, nondilated Liver:  Heterogeneous hepatic echogenicity within nodular surface contour. No focal liver lesions. Portal vein is patent on color Doppler imaging with normal direction of blood flow towards the liver. Other: Small volume ascites in the right upper quadrant. IMPRESSION: Heterogeneous, nodular liver most often compatible with cirrhosis. Cholelithiasis and biliary sludge. Mild wall thickening is nonspecific in the setting of intrinsic liver disease and ascites. No other sonographic features of acute cholecystitis. However, if there is persisting clinical concern, a HIDA could be obtained. Electronically Signed   By: Lovena Le M.D.   On: 08/16/2019 02:35     Medications:     Scheduled Medications:  artificial tears   Both Eyes Q8H   aspirin  81 mg Per Tube Daily   chlorhexidine gluconate (MEDLINE KIT)  15 mL Mouth Rinse BID   Chlorhexidine Gluconate Cloth  6 each Topical Daily   collagenase   Topical Daily   docusate  200 mg Per Tube Daily   feeding supplement (PROSource TF)  45 mL Per Tube BID   fentaNYL (SUBLIMAZE) injection  50 mcg Intravenous Once   insulin aspart  0-24 Units Subcutaneous Q4H   insulin detemir  10 Units Subcutaneous BID   ipratropium  0.5 mg Nebulization Q6H   levalbuterol  1.25 mg Nebulization Q6H   mouth rinse  15 mL Mouth Rinse 10 times per day   metoCLOPramide (REGLAN) injection  10 mg Intravenous Q8H   pantoprazole (PROTONIX) IV  40 mg Intravenous QHS   potassium chloride  20 mEq Per Tube Q4H   rosuvastatin  20 mg Per Tube Daily   sodium chloride flush  10-40 mL Intracatheter Q12H   sodium chloride flush  10-40 mL Intracatheter Q12H   sodium chloride flush  3 mL Intravenous Q12H    Infusions:  sodium chloride 250 mL (08/13/19 1412)   sodium chloride Stopped (08/13/19 1631)   acetaminophen Stopped (08/16/19 0529)   amiodarone 60 mg/hr (08/16/19 0700)   anidulafungin 100 mg (08/16/19 0731)   epinephrine 3 mcg/min (08/16/19 0700)   feeding  supplement (PIVOT 1.5 CAL) 40 mL/hr at 08/16/19 0700   fentaNYL infusion INTRAVENOUS 150 mcg/hr (08/16/19 0700)   furosemide (LASIX) infusion 12 mg/hr (08/16/19 0700)   impella catheter heparin 50 unit/mL in dextrose 5%     heparin 200 Units/hr (08/16/19 0700)   lactated ringers 20 mL/hr at 08/10/19 1705   lactated ringers Stopped (08/15/19 0725)   magnesium sulfate bolus IVPB     meropenem (MERREM) IV Stopped (08/16/19 0601)   midazolam Stopped (08/15/19 1759)   milrinone 0.125 mcg/kg/min (08/16/19 0700)   norepinephrine (LEVOPHED) Adult infusion 25 mcg/min (08/16/19 0700)   propofol (DIPRIVAN) infusion Stopped (08/13/19 1420)   vancomycin 1,500 mg (08/15/19 1547)   vasopressin 0.04 Units/min (08/16/19 0700)    PRN Medications: sodium chloride, sodium chloride, dextrose, fentaNYL, metoprolol tartrate, midazolam, morphine injection, ondansetron (ZOFRAN) IV, sodium chloride flush, sodium chloride flush, sodium chloride flush   Assessment/Plan   1. Shock: At this point, think mixed cardiogenic/septic.  Echo with EF 20-25%, moderate RV dysfunction.  VA ECMO post-VF arrest, cannulated 6/25.  Stable s/p return to OR for mediastinal re-exploration due to bleeding on 6/25.  Decannulated from [place] ECMO on 08/06/19. Impella 5.5 placed. Underwent emergent chest re-exploration  on 6/29 for tamponade and clot removal. Chest closed 7/2.  This morning, still on significant pressor/inotrope support with epinephrine 3, NE, 25, milrinone 0.125, vasopressin 0.04. Have not been able to wean. Suspect component of septic/vasodilatory shock as well with fever, elevated WBCs.  Co-ox 57%, CVP 11. Good UOP with Lasix gtt 12 mg/hr yesterday.  Impella 5.5 at P4, LDH stable.  - Continue current pressors/inotropes, wean as able today. Continue to wean sedation today to see if this will help with pressor wean.  - Continue Impella at P4.  He is now on heparin gtt. Good position by echo yesterday.  - Continue  Lasix 12 mg/hr and replace K.  BMET in pm.  2. 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  Prolonged VF arrest on 6/25. Chest closed 7/2.  - ASA and Crestor.  3. Cardiac arrest: VF arrest with prolonged code. Still with episodes of VT/NSVT in setting of low K (difficult to effectively replace).  - Aggressive K replacement as above, recheck in pm.   - Continue IV amiodarone, can decrease to 30 mg/hr.   - Eventual evaluation for ICD or lifevest at d/c - Keep K > 4.0 Mg > 2.0 4. Acute hypoxemic respiratory failure: In setting of cardiac arrest.  H/o COPD.  Bibasilar infiltrates, possible HCAP.   - On vent, CCM following, suspect will need trach.   - Getting vancomycin/meropenem/[name] for possible PNA.   5. Acute blood loss anemia: Now s/p mediastinal re-exploration x 3. Hgb stable today.  CT drainage has slowed.   - Now on heparin gtt. 6. Thrombocytopenia: 108 this morning, suspect due to critical illness rather than HIT. LDH stable so do not think significant hemolysis.  7. ID: Suspect HCAP, on meropenem/vancomycin/anidulafungin.  WBCs 26 => 22 => 26 => 20 => 23. Tm 101.1. PCT 1.99 today.  Suspect component of septic shock. Cultures negative so far except few Candida in tracheal aspirate. 8. Elevated bilirubin: Mostly direct = likely cholestatic/shock liver/RV failure predominantly.  May have some hemolysis from pump but LDH has been stable 495 -> 469 -> 328 -> 370.  Abdominal US with evidence for cirrhosis, sludge in GB but no definite acute cholecystitis.   9. Ileus: Improving.  Now getting tube feeds and off TPN.  10. Atrial fibrillation/flutter: Paroxysmal. In rate-controlled flutter today.   - Continue amiodarone gtt.  - Heparin gtt.   CRITICAL CARE Performed by: Loralie Champagne  Total critical care time: 40 minutes at bedside  Critical care time was exclusive of separately billable procedures and treating other patients.  Critical care was necessary  to treat or prevent imminent or life-threatening deterioration.  Critical care was time spent personally by me on the following activities: development of treatment plan with patient and/or surrogate as well as nursing, discussions with consultants, evaluation of patient's response to treatment, examination of patient, obtaining history from patient or surrogate, ordering and performing treatments and interventions, ordering and review of laboratory studies, ordering and review of radiographic studies, pulse oximetry and re-evaluation of patient's condition.   Length of Stay: 84  Loralie Champagne, MD  08/16/2019, 7:51 AM  Advanced Heart Failure Team Pager [phone] (M-F; 7a - 4p)  Please contact [facility] for night-coverage after hours (4p -7a ) and weekends on [email or web address]

## 2019-08-16 NOTE — Progress Notes (Signed)
      [address]       [place] [address]             [phone]      Intubated  BP 97/68   Pulse (!) 102   Temp (!) 100.6 F (38.1 C)   Resp (!) 35   Ht 5\' 9"  (1.753 m)   Wt 83.2 kg   SpO2 94%   BMI 27.09 kg/m   Heparin Ab + 2.604  Will stop heparin and start bivalrudin  Searcy Miyoshi C. , MD [facility] [phone]

## 2019-08-16 NOTE — Progress Notes (Signed)
NAME:  Exander Shaul, MRN:  [id number], DOB:  [birth year], LOS: 21 ADMISSION DATE:  07/26/2019, CONSULTATION DATE:  08/03/19 REFERRING MD:  Roxan Hockey, CHIEF COMPLAINT:  ECMO   Brief History   VA ECMO for post CABG Vtach arrest  History of present illness   Presented with worsening dyspnea 6/17 c/w CHF exacerbation Cath with severe triple vessel disease Underwent CABG 6/24 Early this AM Vtach arrest Chest opened bedside and cardiac massage initiated as well as multiple cardioversions, amiodarone, bicarb etc Brought to OR and cannulated for VA ECMO PCCM consulted to assist with management Comorbidities include DM, heavy smoking, COPD  Past Medical History  Depression Ischemic cardiomyopathy HTN [place] [facility] Events   6/24 CABG 6/25 VA cannulation 6/28 decannulated and impella placed 6/29 bedside re-exploration; s/p decannulation. Placement of impella device originally at p8 but decreased to p4 2/2 suction events overnight up to p6. Echo completed and repositioned device. Remained on considerable support with 8 epi and 46 norepi vaso 0.05. continued chest tube output with large clots noted. Heparin thru device. Replacement products ongoing. 60% 8 peep 6/30: bedside mediastinal re-exploration and clean out of hematoma with tamponade and worsening hemodynamics. Pt had large volume transfusion. rebolused with amio 2/2 nsvt episode.  Weight up 48 pounds.  Started diuresis, Lasix drip started. 7/1 iatrogenic respiratory alkalosis, vent rate decreased. 7/2: No significant issues overnight remains on pressors and Impella not tolerating tube feeds with high gastric output awaiting core track placement 7/3: started trickle TF  7/5: Swab removed, CVL and PICC placed.  Consults:  CHF, PCCM, TCTS  Procedures:  See below  Significant Diagnostic Tests:  6/22 spirometry with restrictive physiology, preserved FEV1/FVC CXR today with bilateral airspace disease, cannulas in good  position, deep left sulcus that was present yesterday, no clear PTX 6/18 echo: LVEF 88-91%, grade I diastolic dysfunction 7/7: US Abdomen cholelithiasis without cholecystitis. Mild wall thinking in setting of liver disease and ascites.   Micro Data:  COVID and HIV Neg  Antimicrobials:  Vanc6/24>> Cefuroxime 6/24>> 6/25 merrem 6/26-> Anidulafungin-> Interim history/subjective:   Continues to fever overnight.   Objective   Blood pressure 98/67, pulse 97, temperature 100 F (37.8 C), resp. rate 20, height '5\' 9"'  (1.753 m), weight 83.2 kg, SpO2 92 %. CVP:  [13 mmHg] 13 mmHg  Vent Mode: PRVC FiO2 (%):  [40 %-60 %] 40 % Set Rate:  [20 bmp] 20 bmp Vt Set:  [420 mL] 420 mL PEEP:  [8 cmH20] 8 cmH20 Plateau Pressure:  [19 cmH20-22 cmH20] 19 cmH20   Intake/Output Summary (Last 24 hours) at 08/16/2019 0700 Last data filed at 08/16/2019 0600 Gross per 24 hour  Intake 5004.85 ml  Output 5715 ml  Net -710.15 ml   Filed Weights   08/13/19 0500 08/15/19 0200 08/16/19 0500  Weight: 83.6 kg 81.4 kg 83.2 kg    Examination:  General: elderly male, ill appearing HEENT: pupil reactive to light, trachea midline Cardiovascular: irregular No murmurs, rubs, or gallops Chest: midline chest wound with wound vac Pulmonary : BL vented breath sounds Abdominal: soft, nontender Ext: dusky right finger tips, other ext well perfused  Skin: Warm, dry  Neuro: pupils reactive, on sedation and not alert  Review labs: Procalcitonin Mg 1.8 , Phos 4.4, Albumin 1.4, Ca corrected 9.3, total Billi 10.3, AST 287, ALT 104  Resolved Hospital Problem list   N/A  Assessment & Plan:   Severe coronary artery disease w/ severe ICM status post CABG complicated by VT arrest, cardiogenic  shock and hemorrhagic shock:  EF 20 to 25% with severe LV dysfunction and moderate RV dysfunctionl  -VA ECMO; now decannulated -Impella device initiated 6/28 -Has required reexploration x3 of the chest as well as massive volume of  blood transfusion - Continues to need pressor/ionotrope support, On Epi 3, Norepi 25 , milrinone  .125, vasopressin .04 , impella P-4  - In aflutter this morning, cardiology aware and managing  Plan Amiodarone  Asa+statin IV heparin impella and pressor wean per AHF service   Sedation needs Plan to wean sedation today - propofol stopped 7/5 - versed stopped 7/7 - weaning fentanyl   Acute hypoxic respiratory failure w/ need for mechanical ventilation   On vent, potential need for trach was discussed with teams on 7/5. Cxray shows continued r pleural effusion and bilateral pulmonary edema.  Plan -Continue vent support  - Continuing diuresis with lasix infusion   Fluid and electrolyte balance:  Plan Replete lytes as needed Follow closely Per cardiology   T2DM:  Blood glucose at goal since TPN stopped and started on tube feeds Plan Continue levemir 10 units BID  SSI + CBG    Leukocytosis.   WBC steady, Continues to fever.  Tmax 100.9 in last 24hrs. Plan antibiotic stop day 7/4, fevering so continued on, Mero+vanco ( day 13)  Anidulafungin added 7/7.  Plan Continue  Merc+Vancoop+Anidulafungin  Acute blood loss anemia and thrombocytopenia: Hemoglobin and platelets stable  Plan Observe    Elevated Direct Bilirubin and transaminases  Alk phos, ALT, AST, and direct billi all continue to elevate.RUQ sound on 7/7 showed cholelithiasis without acute cholecystitis. Nodular liver is noted w/ elevated Bilirubin and transaminases likely represent chronic liver disease worsening in setting of heart failure and not acute infection.  Nutrition:  Tube Feeds   Best practice:  Diet: Tube feeds Pain/Anxiety/Delirium protocol (if indicated): fentanyl VAP protocol (if indicated): in place DVT prophylaxis: heparin gtt via device GI prophylaxis: PPI Glucose control: see above Mobility: BR Code Status: Full Family Communication: per primary Disposition:  ICU  Tamsen Snider, MD PGY2

## 2019-08-16 NOTE — Progress Notes (Signed)
6 Days Post-Op Procedure(s) (LRB): STERNAL CLOSURE (N/A) TRANSESOPHAGEAL ECHOCARDIOGRAM (TEE) (N/A) APPLICATION OF WOUND VAC (N/A) Subjective: Intubated   Objective: Vital signs in last 24 hours: Temp:  [100 F (37.8 C)-101.1 F (38.4 C)] 100 F (37.8 C) (07/08 0800) Pulse Rate:  [83-99] 90 (07/08 0800) Cardiac Rhythm: Atrial flutter;Atrial fibrillation (07/08 0800) Resp:  [10-33] 10 (07/08 0800) BP: (84-120)/(55-76) 104/59 (07/08 0800) SpO2:  [90 %-96 %] 94 % (07/08 0800) FiO2 (%):  [40 %-60 %] 40 % (07/08 0800) Weight:  [83.2 kg] 83.2 kg (07/08 0500)  Hemodynamic parameters for last 24 hours: CVP:  [13 mmHg] 13 mmHg  Intake/Output from previous day: 07/07 0701 - 07/08 0700 In: 5165.2 [I.V.:2982.3; NG/GT:584.3; IV Piggyback:1467.3] Out: 5715 [Urine:4975; Emesis/NG output:250; Chest Tube:490] Intake/Output this shift: Total I/O In: 149.8 [I.V.:112; Other:5.8; IV Piggyback:32] Out: 300 [Urine:300]  General appearance: intubated Neurologic: opens eyes spontaneously, not following commands Heart: regular, mildly tachy Lungs: rhonchi bilaterally Abdomen: normal findings: nondistended Extremities: right nail beds, fingertips unchanged  Lab Results: Recent Labs    08/15/19 0520 08/15/19 0520 08/15/19 1214 08/16/19 0450  WBC 20.9*  --   --  23.4*  HGB 11.1*   < > 12.6* 11.1*  HCT 33.2*   < > 37.0* 33.2*  PLT 83*  --   --  108*   < > = values in this interval not displayed.   BMET:  Recent Labs    08/15/19 2338 08/16/19 0450  NA 137 138  K 3.2* 3.6  CL 97* 97*  CO2 30 30  GLUCOSE 87 172*  BUN 32* 34*  CREATININE 1.26* 1.27*  CALCIUM 7.1* 7.2*    PT/INR: No results for input(s): LABPROT, INR in the last 72 hours. ABG    Component Value Date/Time   PHART 7.370 08/14/2019 0226   HCO3 34.7 (H) 08/14/2019 0226   TCO2 34 (H) 08/15/2019 1214   ACIDBASEDEF 1.0 08/08/2019 1115   O2SAT 56.8 08/16/2019 0450   CBG (last 3)  Recent Labs    08/15/19 2345  08/16/19 0459 08/16/19 0736  GLUCAP 88 154* 169*    Assessment/Plan: S/P Procedure(s) (LRB): STERNAL CLOSURE (N/A) TRANSESOPHAGEAL ECHOCARDIOGRAM (TEE) (N/A) APPLICATION OF WOUND VAC (N/A) -Remains critically ill with multiple organ system failure CV- co-ox 57 with Impella at p4, milrinone 0.125  norepi weaned a little overnight  In A flutter- tried rapid AP- went into AF briefly then back to flutter with controlled rate RESP- VDRF, vent per CCM RENAL- creatinine stable, up form a couple of days ago  Supplement K, diurese ENDO- CBG better with TNA off Nutrition/ GI- tolerating postpyloric feedings ID- still having temps, WBC elevated, on vanco, meropenem and [name] NEURO- opening eyes, still on fair amount of sedation,. Continue to wean sedation as tolerated  LOS: 21 days    [name] 08/16/2019

## 2019-08-16 NOTE — Progress Notes (Signed)
ANTICOAGULATION CONSULT NOTE - Follow Up Consult  Pharmacy Consult for heparin Indication: ECMO > impella  Labs: Recent Labs    08/14/19 0413 08/14/19 1519 08/15/19 0520 08/15/19 1200 08/15/19 1214 08/15/19 1214 08/15/19 1445 08/15/19 2338 08/16/19 0450  HGB 11.2*  --  11.1*  --  12.6*  --   --   --  11.1*  HCT 33.9*  --  33.2*  --  37.0*  --   --   --  33.2*  PLT 104*  --  83*  --   --   --   --   --  108*  HEPARINUNFRC <0.10*  --  <0.10*  --   --   --   --   --  <0.10*  CREATININE 1.02   < > 1.11   < > 1.30*   < > 1.17 1.26* 1.27*   < > = values in this interval not displayed.    Assessment: 64yo male s/p ECMO, now on Impella. Chest closed 7/2. CT drainage decreased.  Hgb stable 11.1, pltc 83. Last INR 1.2 6/29  Heparin level remains undetectable, on 305 units/hr (purge flow 6.1 mL/hr) from purge solution and 200 units/hr systemically. Hgb 11.1, plt 108 (up slightly). LDH 370.  New central line placed and new PICC line placed 7/5. F/u plan for trach this week.  Plan: Continue heparin in purge solution  Start systemic heparin infusion at 200 units/hr - no titration at this time Daily heparin level and CBC.  Sherron Monday, PharmD, BCCCP Clinical Pharmacist  Phone: [phone] 08/16/2019 7:18 AM  Please check AMION for all [facility] phone numbers After 10:00 PM, call Main Pharmacy [phone]

## 2019-08-17 ENCOUNTER — Inpatient Hospital Stay (HOSPITAL_COMMUNITY): Payer: Medicare Other | Admitting: Certified Registered Nurse Anesthetist

## 2019-08-17 ENCOUNTER — Inpatient Hospital Stay (HOSPITAL_COMMUNITY): Payer: Medicare Other

## 2019-08-17 ENCOUNTER — Encounter (HOSPITAL_COMMUNITY): Payer: Self-pay | Admitting: Thoracic Surgery (Cardiothoracic Vascular Surgery)

## 2019-08-17 ENCOUNTER — Encounter (HOSPITAL_COMMUNITY)
Admission: RE | Disposition: A | Discharge status: Nursing Home (Includes ICF) | Payer: Self-pay | Source: Home / Self Care | Attending: Thoracic Surgery (Cardiothoracic Vascular Surgery)

## 2019-08-17 ENCOUNTER — Encounter (HOSPITAL_COMMUNITY): Payer: Medicare Other

## 2019-08-17 DIAGNOSIS — I469 Cardiac arrest, cause unspecified: Secondary | ICD-10-CM

## 2019-08-17 HISTORY — PX: REMOVAL OF IMPELLA LEFT VENTRICULAR ASSIST DEVICE: SHX6556

## 2019-08-17 HISTORY — PX: TEE WITHOUT CARDIOVERSION: SHX5443

## 2019-08-17 LAB — RENAL FUNCTION PANEL
Albumin: 1.4 g/dL — ABNORMAL LOW (ref 3.5–5.0)
Anion gap: 9 (ref 5–15)
BUN: 48 mg/dL — ABNORMAL HIGH (ref 8–23)
CO2: 32 mmol/L (ref 22–32)
Calcium: 7.5 mg/dL — ABNORMAL LOW (ref 8.9–10.3)
Chloride: 99 mmol/L (ref 98–111)
Creatinine, Ser: 1.41 mg/dL — ABNORMAL HIGH (ref 0.61–1.24)
GFR calc Af Amer: >60 mL/min (ref >60)
GFR calc non Af Amer: 52 mL/min — ABNORMAL LOW (ref >60)
Glucose, Bld: 102 mg/dL — ABNORMAL HIGH (ref 70–99)
Phosphorus: 4.8 mg/dL — ABNORMAL HIGH (ref 2.5–4.6)
Potassium: 3.4 mmol/L — ABNORMAL LOW (ref 3.5–5.1)
Sodium: 140 mmol/L (ref 135–145)

## 2019-08-17 LAB — COOXEMETRY PANEL
Carboxyhemoglobin: 0.8 % (ref 0.5–1.5)
Carboxyhemoglobin: 0.7 % (ref 0.5–1.5)
Methemoglobin: 0.9 % (ref 0.0–1.5)
Methemoglobin: 0.8 % (ref 0.0–1.5)
O2 Saturation: 65.2 %
O2 Saturation: 60.3 %
Total hemoglobin: 11.6 g/dL — ABNORMAL LOW (ref 12.0–16.0)
Total hemoglobin: 11.6 g/dL — ABNORMAL LOW (ref 12.0–16.0)

## 2019-08-17 LAB — BASIC METABOLIC PANEL
Anion gap: 9 (ref 5–15)
BUN: 46 mg/dL — ABNORMAL HIGH (ref 8–23)
CO2: 31 mmol/L (ref 22–32)
Calcium: 7.1 mg/dL — ABNORMAL LOW (ref 8.9–10.3)
Chloride: 101 mmol/L (ref 98–111)
Creatinine, Ser: 1.38 mg/dL — ABNORMAL HIGH (ref 0.61–1.24)
GFR calc Af Amer: >60 mL/min (ref >60)
GFR calc non Af Amer: 53 mL/min — ABNORMAL LOW (ref >60)
Glucose, Bld: 206 mg/dL — ABNORMAL HIGH (ref 70–99)
Potassium: 3.6 mmol/L (ref 3.5–5.1)
Sodium: 141 mmol/L (ref 135–145)

## 2019-08-17 LAB — CBC
HCT: 31.9 % — ABNORMAL LOW (ref 39.0–52.0)
Hemoglobin: 11 g/dL — ABNORMAL LOW (ref 13.0–17.0)
MCH: 29.5 pg (ref 26.0–34.0)
MCHC: 34.5 g/dL (ref 30.0–36.0)
MCV: 85.5 fL (ref 80.0–100.0)
Platelets: 156 10*3/uL (ref 150–400)
RBC: 3.73 MIL/uL — ABNORMAL LOW (ref 4.22–5.81)
RDW: 18.2 % — ABNORMAL HIGH (ref 11.5–15.5)
WBC: 17.6 10*3/uL — ABNORMAL HIGH (ref 4.0–10.5)
nRBC: 0 % (ref 0.0–0.2)

## 2019-08-17 LAB — COMPREHENSIVE METABOLIC PANEL
ALT: 135 U/L — ABNORMAL HIGH (ref 0–44)
AST: 308 U/L — ABNORMAL HIGH (ref 15–41)
Albumin: 1.4 g/dL — ABNORMAL LOW (ref 3.5–5.0)
Alkaline Phosphatase: 183 U/L — ABNORMAL HIGH (ref 38–126)
Anion gap: 10 (ref 5–15)
BUN: 46 mg/dL — ABNORMAL HIGH (ref 8–23)
CO2: 32 mmol/L (ref 22–32)
Calcium: 7.3 mg/dL — ABNORMAL LOW (ref 8.9–10.3)
Chloride: 98 mmol/L (ref 98–111)
Creatinine, Ser: 1.41 mg/dL — ABNORMAL HIGH (ref 0.61–1.24)
GFR calc Af Amer: >60 mL/min (ref >60)
GFR calc non Af Amer: 52 mL/min — ABNORMAL LOW (ref >60)
Glucose, Bld: 323 mg/dL — ABNORMAL HIGH (ref 70–99)
Potassium: 3.2 mmol/L — ABNORMAL LOW (ref 3.5–5.1)
Sodium: 140 mmol/L (ref 135–145)
Total Bilirubin: 9.3 mg/dL — ABNORMAL HIGH (ref 0.3–1.2)
Total Protein: 5.2 g/dL — ABNORMAL LOW (ref 6.5–8.1)

## 2019-08-17 LAB — URINE CULTURE: Culture: NO GROWTH

## 2019-08-17 LAB — ECHO INTRAOPERATIVE TEE
Height: 69 in
Weight: 2959.46 oz

## 2019-08-17 LAB — GLUCOSE, CAPILLARY
Glucose-Capillary: 104 mg/dL — ABNORMAL HIGH (ref 70–99)
Glucose-Capillary: 143 mg/dL — ABNORMAL HIGH (ref 70–99)
Glucose-Capillary: 209 mg/dL — ABNORMAL HIGH (ref 70–99)
Glucose-Capillary: 233 mg/dL — ABNORMAL HIGH (ref 70–99)
Glucose-Capillary: 286 mg/dL — ABNORMAL HIGH (ref 70–99)
Glucose-Capillary: 326 mg/dL — ABNORMAL HIGH (ref 70–99)
Glucose-Capillary: 98 mg/dL (ref 70–99)

## 2019-08-17 LAB — LACTATE DEHYDROGENASE: LDH: 389 U/L — ABNORMAL HIGH (ref 98–192)

## 2019-08-17 LAB — TRIGLYCERIDES: Triglycerides: 309 mg/dL — ABNORMAL HIGH (ref <150)

## 2019-08-17 LAB — APTT: aPTT: 82 seconds — ABNORMAL HIGH (ref 24–36)

## 2019-08-17 LAB — MAGNESIUM: Magnesium: 2 mg/dL (ref 1.7–2.4)

## 2019-08-17 LAB — PROCALCITONIN: Procalcitonin: 2.35 ng/mL

## 2019-08-17 SURGERY — REMOVAL, CARDIAC ASSIST DEVICE, IMPELLA
Anesthesia: General

## 2019-08-17 MED ORDER — 0.9 % SODIUM CHLORIDE (POUR BTL) OPTIME
TOPICAL | Status: DC | PRN
  Administered 2019-08-17: 3000 mL

## 2019-08-17 MED ORDER — AMIODARONE LOAD VIA INFUSION
150.0000 mg | Freq: Once | INTRAVENOUS | Status: AC
  Administered 2019-08-17: 150 mg via INTRAVENOUS
  Filled 2019-08-17: qty 83.34

## 2019-08-17 MED ORDER — MAGNESIUM SULFATE IN D5W 1-5 GM/100ML-% IV SOLN
1.0000 g | Freq: Once | INTRAVENOUS | Status: AC
  Administered 2019-08-17: 1 g via INTRAVENOUS
  Filled 2019-08-17: qty 100

## 2019-08-17 MED ORDER — EPHEDRINE 5 MG/ML INJ
INTRAVENOUS | Status: AC
  Filled 2019-08-17: qty 10

## 2019-08-17 MED ORDER — SODIUM CHLORIDE 0.9 % IV SOLN
0.0550 mg/kg/h | INTRAVENOUS | Status: AC
  Administered 2019-08-18: 0.07 mg/kg/h via INTRAVENOUS
  Administered 2019-08-20: 0.055 mg/kg/h via INTRAVENOUS
  Filled 2019-08-17 (×2): qty 250

## 2019-08-17 MED ORDER — POTASSIUM CHLORIDE 10 MEQ/50ML IV SOLN
10.0000 meq | INTRAVENOUS | Status: AC
  Administered 2019-08-17 (×2): 10 meq via INTRAVENOUS
  Filled 2019-08-17 (×2): qty 50

## 2019-08-17 MED ORDER — PHENYLEPHRINE 40 MCG/ML (10ML) SYRINGE FOR IV PUSH (FOR BLOOD PRESSURE SUPPORT)
PREFILLED_SYRINGE | INTRAVENOUS | Status: AC
  Filled 2019-08-17: qty 10

## 2019-08-17 MED ORDER — POTASSIUM CHLORIDE 20 MEQ/15ML (10%) PO SOLN
40.0000 meq | Freq: Every day | ORAL | Status: DC
  Administered 2019-08-17: 40 meq
  Filled 2019-08-17 (×2): qty 30

## 2019-08-17 MED ORDER — MIDAZOLAM HCL 2 MG/2ML IJ SOLN
INTRAMUSCULAR | Status: DC | PRN
  Administered 2019-08-17 (×2): 1 mg via INTRAVENOUS

## 2019-08-17 MED ORDER — LACTATED RINGERS IV SOLN
INTRAVENOUS | Status: DC | PRN
Start: 2019-08-17 — End: 2019-08-17

## 2019-08-17 MED ORDER — SUCCINYLCHOLINE CHLORIDE 200 MG/10ML IV SOSY
PREFILLED_SYRINGE | INTRAVENOUS | Status: AC
  Filled 2019-08-17: qty 10

## 2019-08-17 MED ORDER — INSULIN DETEMIR 100 UNIT/ML ~~LOC~~ SOLN
12.0000 [IU] | Freq: Two times a day (BID) | SUBCUTANEOUS | Status: DC
  Administered 2019-08-17 – 2019-08-19 (×6): 12 [IU] via SUBCUTANEOUS
  Filled 2019-08-17 (×8): qty 0.12

## 2019-08-17 MED ORDER — FENTANYL CITRATE (PF) 250 MCG/5ML IJ SOLN
INTRAMUSCULAR | Status: DC | PRN
  Administered 2019-08-17: 50 ug via INTRAVENOUS

## 2019-08-17 MED ORDER — ROCURONIUM BROMIDE 10 MG/ML (PF) SYRINGE
PREFILLED_SYRINGE | INTRAVENOUS | Status: AC
  Filled 2019-08-17: qty 20

## 2019-08-17 MED ORDER — INSULIN ASPART 100 UNIT/ML ~~LOC~~ SOLN
5.0000 [IU] | SUBCUTANEOUS | Status: DC
  Administered 2019-08-17 – 2019-09-02 (×80): 5 [IU] via SUBCUTANEOUS

## 2019-08-17 MED ORDER — LIDOCAINE 2% (20 MG/ML) 5 ML SYRINGE
INTRAMUSCULAR | Status: AC
  Filled 2019-08-17: qty 5

## 2019-08-17 MED ORDER — POTASSIUM CHLORIDE 20 MEQ/15ML (10%) PO SOLN: 40.0000 meq | Freq: Every day | ORAL | Status: DC

## 2019-08-17 MED ORDER — ONDANSETRON HCL 4 MG/2ML IJ SOLN
INTRAMUSCULAR | Status: AC
  Filled 2019-08-17: qty 2

## 2019-08-17 MED ORDER — INSULIN ASPART 100 UNIT/ML ~~LOC~~ SOLN
3.0000 [IU] | SUBCUTANEOUS | Status: DC
  Administered 2019-08-17: 3 [IU] via SUBCUTANEOUS

## 2019-08-17 MED ORDER — MIDAZOLAM HCL 2 MG/2ML IJ SOLN
INTRAMUSCULAR | Status: AC
  Filled 2019-08-17: qty 2

## 2019-08-17 MED ORDER — POTASSIUM CHLORIDE 10 MEQ/50ML IV SOLN
INTRAVENOUS | Status: AC
  Administered 2019-08-17: 10 meq via INTRAVENOUS
  Filled 2019-08-17: qty 50

## 2019-08-17 MED ORDER — POTASSIUM CHLORIDE 20 MEQ/15ML (10%) PO SOLN
40.0000 meq | ORAL | Status: AC
  Administered 2019-08-17 (×2): 40 meq
  Filled 2019-08-17 (×2): qty 30

## 2019-08-17 MED ORDER — POTASSIUM CHLORIDE 20 MEQ/15ML (10%) PO SOLN: 40.0000 meq | Freq: Once | ORAL | Status: DC

## 2019-08-17 MED ORDER — FENTANYL CITRATE (PF) 250 MCG/5ML IJ SOLN
INTRAMUSCULAR | Status: AC
  Filled 2019-08-17: qty 5

## 2019-08-17 MED ORDER — AMIODARONE LOAD VIA INFUSION: 150.0000 mg | Freq: Once | INTRAVENOUS | Status: DC

## 2019-08-17 SURGICAL SUPPLY — 51 items
BLADE CLIPPER SURG (BLADE) ×1 IMPLANT
CLIP VESOCCLUDE LG 6/CT (CLIP) ×3 IMPLANT
CLIP VESOCCLUDE MED 24/CT (CLIP) ×2 IMPLANT
CLIP VESOCCLUDE SM WIDE 24/CT (CLIP) ×2 IMPLANT
DRAIN HEMOVAC 1/8 X 5 (WOUND CARE) IMPLANT
DRAPE SLUSH/WARMER DISC (DRAPES) ×3 IMPLANT
DRSG AQUACEL AG ADV 3.5X14 (GAUZE/BANDAGES/DRESSINGS) ×2 IMPLANT
EVACUATOR SILICONE 100CC (DRAIN) IMPLANT
FELT TEFLON 1X6 (MISCELLANEOUS) ×2 IMPLANT
GLOVE BIO SURGEON STRL SZ 6.5 (GLOVE) ×1 IMPLANT
GLOVE BIO SURGEONS STRL SZ 6.5 (GLOVE) ×1
GLOVE BIOGEL PI IND STRL 6.5 (GLOVE) IMPLANT
GLOVE BIOGEL PI INDICATOR 6.5 (GLOVE) ×4
GLOVE NEODERM STRL 7.5 LF PF (GLOVE) ×1 IMPLANT
GLOVE SURG NEODERM 7.5  LF PF (GLOVE)
GLOVE SURG SIGNA 7.5 PF LTX (GLOVE) ×2 IMPLANT
GOWN STRL REUS W/ TWL LRG LVL3 (GOWN DISPOSABLE) ×2 IMPLANT
GOWN STRL REUS W/ TWL XL LVL3 (GOWN DISPOSABLE) IMPLANT
GOWN STRL REUS W/TWL LRG LVL3 (GOWN DISPOSABLE) ×2
GOWN STRL REUS W/TWL XL LVL3 (GOWN DISPOSABLE) ×2
HANDLE STAPLE ENDO GIA SHORT (STAPLE) ×2
INSERT FOGARTY SM (MISCELLANEOUS) ×5 IMPLANT
KIT BASIN OR (CUSTOM PROCEDURE TRAY) ×3 IMPLANT
NS IRRIG 1000ML POUR BTL (IV SOLUTION) ×8 IMPLANT
PACK CHEST (CUSTOM PROCEDURE TRAY) ×2 IMPLANT
PACK GENERAL/GYN (CUSTOM PROCEDURE TRAY) ×1 IMPLANT
PACK UNIVERSAL I (CUSTOM PROCEDURE TRAY) ×3 IMPLANT
PAD ARMBOARD 7.5X6 YLW CONV (MISCELLANEOUS) ×6 IMPLANT
PAD ELECT DEFIB RADIOL ZOLL (MISCELLANEOUS) ×3 IMPLANT
RELOAD STAPLE 30 PURP MED/THCK (STAPLE) IMPLANT
RELOAD TRI 2.0 30 MED THCK SUL (STAPLE) ×3 IMPLANT
STAPLER ENDO GIA 12 SHRT THIN (STAPLE) IMPLANT
STAPLER ENDO GIA 12MM SHORT (STAPLE) ×1 IMPLANT
STAPLER VISISTAT 35W (STAPLE) ×2 IMPLANT
SUT ETHILON 3 0 PS 1 (SUTURE) IMPLANT
SUT MNCRL AB 4-0 PS2 18 (SUTURE) ×1 IMPLANT
SUT PROLENE 4 0 RB 1 (SUTURE) ×2
SUT PROLENE 4-0 RB1 .5 CRCL 36 (SUTURE) ×1 IMPLANT
SUT SILK  1 MH (SUTURE)
SUT SILK 1 MH (SUTURE) ×4 IMPLANT
SUT SILK 1 TIES 10X30 (SUTURE) ×1 IMPLANT
SUT VIC AB 2-0 CT1 36 (SUTURE) ×3 IMPLANT
SUT VIC AB 3-0 SH 27 (SUTURE) ×2
SUT VIC AB 3-0 SH 27X BRD (SUTURE) IMPLANT
SUT VIC AB 3-0 X1 27 (SUTURE) ×2 IMPLANT
SWAB COLLECTION DEVICE MRSA (MISCELLANEOUS) ×2 IMPLANT
SWAB CULTURE ESWAB REG 1ML (MISCELLANEOUS) ×2 IMPLANT
TAPE CLOTH SURG 4X10 WHT LF (GAUZE/BANDAGES/DRESSINGS) ×2 IMPLANT
TOWEL GREEN STERILE (TOWEL DISPOSABLE) ×3 IMPLANT
TOWEL GREEN STERILE FF (TOWEL DISPOSABLE) ×3 IMPLANT
WATER STERILE IRR 1000ML POUR (IV SOLUTION) ×6 IMPLANT

## 2019-08-17 NOTE — Progress Notes (Signed)
EVENING ROUNDS NOTE :     [address]       [place] [address]             [phone]                 Day of Surgery Procedure(s) (LRB): REMOVAL OF IMPELLA LEFT VENTRICULAR ASSIST DEVICE (N/A) TRANSESOPHAGEAL ECHOCARDIOGRAM (TEE) (N/A)   Total Length of Stay:  LOS: 22 days  Events:   Impella removed this afternoon Hemodynamically stable.  Has had some PVCs Instructed the nurses to place R2 pads on 1 for right now given his history of prolonged V. tach    BP (!) 100/59   Pulse 100   Temp 99 F (37.2 C)   Resp 19   Ht 5\' 9"  (1.753 m)   Wt 83.9 kg   SpO2 95%   BMI 27.31 kg/m      Vent Mode: PRVC FiO2 (%):  [50 %-80 %] 60 % Set Rate:  [20 bmp] 20 bmp Vt Set:  [420 mL] 420 mL PEEP:  [8 cmH20] 8 cmH20 Plateau Pressure:  [18 cmH20-22 cmH20] 22 cmH20  . sodium chloride 250 mL (08/13/19 1412)  . sodium chloride Stopped (08/13/19 1631)  . amiodarone 60 mg/hr (08/17/19 1800)  . anidulafungin Stopped (08/17/19 10/18/19)  . [START ON 08/18/2019] bivalirudin (ANGIOMAX) infusion 0.5 mg/mL (Non-ACS indications)    . dextrose 5 % Impella 5.0 Purge solution    . epinephrine 3 mcg/min (08/17/19 1800)  . feeding supplement (PIVOT 1.5 CAL) 1,000 mL (08/17/19 1641)  . fentaNYL infusion INTRAVENOUS 225 mcg/hr (08/17/19 1800)  . furosemide (LASIX) infusion 10 mg/hr (08/17/19 1800)  . lactated ringers 20 mL/hr at 08/10/19 1705  . lactated ringers Stopped (08/15/19 0725)  . magnesium sulfate bolus IVPB 1 g (08/17/19 1806)  . meropenem (MERREM) IV 1 g (08/17/19 1302)  . midazolam Stopped (08/15/19 1759)  . milrinone 0.125 mcg/kg/min (08/17/19 1800)  . norepinephrine (LEVOPHED) Adult infusion 16 mcg/min (08/17/19 1800)  . potassium chloride 50 mL/hr at 08/17/19 1800  . propofol (DIPRIVAN) infusion Stopped (08/13/19 1420)  . vancomycin 0 mL/hr at 08/16/19 1424  . vasopressin 0.04 Units/min (08/17/19 1800)    I/O last 3 completed shifts: In: 7107.3 [I.V.:3853.1;  02-05-2008; NG/GT:1603.7; IV Piggyback:1436.6] Out: SAYTK:160; Emesis/NG output:450; Stool:250; Chest Tube:860]   CBC Latest Ref Rng & Units 08/17/2019 08/16/2019 08/15/2019  WBC 4.0 - 10.5 K/uL 17.6(H) 23.4(H) -  Hemoglobin 13.0 - 17.0 g/dL 11.0(L) 11.1(L) 12.6(L)  Hematocrit 39 - 52 % 31.9(L) 33.2(L) 37.0(L)  Platelets 150 - 400 K/uL 156 108(L) -    BMP Latest Ref Rng & Units 08/17/2019 08/17/2019 08/17/2019  Glucose 70 - 99 mg/dL 10/18/2019) 254(Y) 706(C)  BUN 8 - 23 mg/dL 376(E) 83(T) 51(V)  Creatinine 0.61 - 1.24 mg/dL 61(Y) 0.73(X) 1.06(Y)  Sodium 135 - 145 mmol/L 140 141 140  Potassium 3.5 - 5.1 mmol/L 3.4(L) 3.6 3.2(L)  Chloride 98 - 111 mmol/L 99 101 98  CO2 22 - 32 mmol/L 32 31 32  Calcium 8.9 - 10.3 mg/dL 7.5(L) 7.1(L) 7.3(L)    ABG    Component Value Date/Time   PHART 7.370 08/14/2019 0226   PCO2ART 60.6 (H) 08/14/2019 0226   PO2ART 90 08/14/2019 0226   HCO3 34.7 (H) 08/14/2019 0226   TCO2 34 (H) 08/15/2019 1214   ACIDBASEDEF 1.0 08/08/2019 1115   O2SAT 60.3 08/17/2019 1104       10/18/2019, MD 08/17/2019 6:30 PM

## 2019-08-17 NOTE — Progress Notes (Signed)
RT note: RT and CRNA transported vent patient to OR and back to 2H 02 post procedure. Vital signs stable through out.

## 2019-08-17 NOTE — Progress Notes (Signed)
ANTICOAGULATION CONSULT NOTE - Follow Up Consult  Pharmacy Consult for bivalirudin Indication: Impella  Labs: Recent Labs    08/15/19 0520 08/15/19 1200 08/15/19 1214 08/15/19 1445 08/16/19 0450 08/16/19 1512 08/16/19 2000 08/16/19 2200 08/17/19 0233 08/17/19 0500  HGB 11.1*  --  12.6*  --  11.1*  --   --   --   --   --   HCT 33.2*  --  37.0*  --  33.2*  --   --   --   --   --   PLT 83*  --   --   --  108*  --   --   --   --   --   APTT  --   --   --   --   --   --  76* 77*  --  82*  HEPARINUNFRC <0.10*  --   --   --  <0.10*  --   --   --   --   --   CREATININE 1.11   < > 1.30*   < > 1.27* 1.42*  --   --  1.41*  --    < > = values in this interval not displayed.    Assessment: 64yo male supratherapeutic on bivalirudin, likely accumulating, tight goal.  Goal of Therapy:  aPTT 50-70 seconds   Plan:  Decrease bivalirudin by 30% and recheck PTT.  Vernard Gambles, PharmD, BCPS  08/17/2019,6:59 AM

## 2019-08-17 NOTE — Interval H&P Note (Signed)
History and Physical Interval Note:  08/17/2019 1:34 PM  Todd [name]  has presented today for surgery, with the diagnosis of S/P CABG.  The various methods of treatment have been discussed with the patient and family. After consideration of risks, benefits and other options for treatment, the patient has consented to  Procedure(s): REMOVAL OF IMPELLA LEFT VENTRICULAR ASSIST DEVICE (N/A) TRANSESOPHAGEAL ECHOCARDIOGRAM (TEE) (N/A) as a surgical intervention.  The patient's history has been reviewed, patient examined, no change in status, stable for surgery.  I have reviewed the patient's chart and labs.  Questions were answered to the patient's satisfaction.     [name]

## 2019-08-17 NOTE — Progress Notes (Signed)
7 Days Post-Op Procedure(s) (LRB): STERNAL CLOSURE (N/A) TRANSESOPHAGEAL ECHOCARDIOGRAM (TEE) (N/A) APPLICATION OF WOUND VAC (N/A) Subjective: More non sustained VT overnight Back up on sedation overnight  Objective: Vital signs in last 24 hours: Temp:  [99.9 F (37.7 Todd)-101.3 F (38.5 Todd)] 100.6 F (38.1 Todd) (07/09 0702) Pulse Rate:  [82-112] 95 (07/09 0702) Cardiac Rhythm: Atrial flutter (07/09 0400) Resp:  [10-52] 23 (07/09 0702) BP: (87-121)/(40-88) 103/51 (07/09 0700) SpO2:  [90 %-99 %] 95 % (07/09 0719) FiO2 (%):  [40 %-50 %] 50 % (07/09 0720) Weight:  [83.9 kg] 83.9 kg (07/09 0609)  Hemodynamic parameters for last 24 hours:    Intake/Output from previous day: 07/08 0701 - 07/09 0700 In: 3768.9 [I.V.:2381.6; NG/GT:418.8; IV Piggyback:826] Out: 4635 [Urine:3555; Emesis/NG output:200; Stool:250; Chest Tube:630] Intake/Output this shift: No intake/output data recorded.  General appearance: intubated Heart: regular rate and rhythm Lungs: rhonchi bilaterally Abdomen: normal findings: nondistended Extremities: Right hand overall better, inde fingertip dusky Wound: VAC in place  Lab Results: Recent Labs    08/15/19 0520 08/15/19 0520 08/15/19 1214 08/16/19 0450  WBC 20.9*  --   --  23.4*  HGB 11.1*   < > 12.6* 11.1*  HCT 33.2*   < > 37.0* 33.2*  PLT 83*  --   --  108*   < > = values in this interval not displayed.   BMET:  Recent Labs    08/16/19 1512 08/16/19 1512 08/16/19 2000 08/17/19 0233  NA 137  --   --  140  K 3.4*   < > 3.9 3.2*  CL 96*  --   --  98  CO2 31  --   --  32  GLUCOSE 236*  --   --  323*  BUN 38*  --   --  46*  CREATININE 1.42*  --   --  1.41*  CALCIUM 7.2*  --   --  7.3*   < > = values in this interval not displayed.    PT/INR: No results for input(s): LABPROT, INR in the last 72 hours. ABG    Component Value Date/Time   PHART 7.370 08/14/2019 0226   HCO3 34.7 (H) 08/14/2019 0226   TCO2 34 (H) 08/15/2019 1214   ACIDBASEDEF  1.0 08/08/2019 1115   O2SAT 65.2 08/17/2019 0500   CBG (last 3)  Recent Labs    08/16/19 2005 08/17/19 0009 08/17/19 0348  GLUCAP 267* 326* 286*    Assessment/Plan: S/P Procedure(s) (LRB): STERNAL CLOSURE (N/A) TRANSESOPHAGEAL ECHOCARDIOGRAM (TEE) (N/A) APPLICATION OF WOUND VAC (N/A) - Remains critically ill  CV- down on levophed. 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  

## 2019-08-17 NOTE — H&P (View-Only) (Signed)
7 Days Post-Op Procedure(s) (LRB): STERNAL CLOSURE (N/A) TRANSESOPHAGEAL ECHOCARDIOGRAM (TEE) (N/A) APPLICATION OF WOUND VAC (N/A) Subjective: More non sustained VT overnight Back up on sedation overnight  Objective: Vital signs in last 24 hours: Temp:  [99.9 F (37.7 C)-101.3 F (38.5 C)] 100.6 F (38.1 C) (07/09 0702) Pulse Rate:  [82-112] 95 (07/09 0702) Cardiac Rhythm: Atrial flutter (07/09 0400) Resp:  [10-52] 23 (07/09 0702) BP: (87-121)/(40-88) 103/51 (07/09 0700) SpO2:  [90 %-99 %] 95 % (07/09 0719) FiO2 (%):  [40 %-50 %] 50 % (07/09 0720) Weight:  [83.9 kg] 83.9 kg (07/09 0609)  Hemodynamic parameters for last 24 hours:    Intake/Output from previous day: 07/08 0701 - 07/09 0700 In: 3768.9 [I.V.:2381.6; NG/GT:418.8; IV Piggyback:826] Out: 4635 [Urine:3555; Emesis/NG output:200; Stool:250; Chest Tube:630] Intake/Output this shift: No intake/output data recorded.  General appearance: intubated Heart: regular rate and rhythm Lungs: rhonchi bilaterally Abdomen: normal findings: nondistended Extremities: Right hand overall better, inde fingertip dusky Wound: VAC in place  Lab Results: Recent Labs    08/15/19 0520 08/15/19 0520 08/15/19 1214 08/16/19 0450  WBC 20.9*  --   --  23.4*  HGB 11.1*   < > 12.6* 11.1*  HCT 33.2*   < > 37.0* 33.2*  PLT 83*  --   --  108*   < > = values in this interval not displayed.   BMET:  Recent Labs    08/16/19 1512 08/16/19 1512 08/16/19 2000 08/17/19 0233  NA 137  --   --  140  K 3.4*   < > 3.9 3.2*  CL 96*  --   --  98  CO2 31  --   --  32  GLUCOSE 236*  --   --  323*  BUN 38*  --   --  46*  CREATININE 1.42*  --   --  1.41*  CALCIUM 7.2*  --   --  7.3*   < > = values in this interval not displayed.    PT/INR: No results for input(s): LABPROT, INR in the last 72 hours. ABG    Component Value Date/Time   PHART 7.370 08/14/2019 0226   HCO3 34.7 (H) 08/14/2019 0226   TCO2 34 (H) 08/15/2019 1214   ACIDBASEDEF  1.0 08/08/2019 1115   O2SAT 65.2 08/17/2019 0500   CBG (last 3)  Recent Labs    08/16/19 2005 08/17/19 0009 08/17/19 0348  GLUCAP 267* 326* 286*    Assessment/Plan: S/P Procedure(s) (LRB): STERNAL CLOSURE (N/A) TRANSESOPHAGEAL ECHOCARDIOGRAM (TEE) (N/A) APPLICATION OF WOUND VAC (N/A) - Remains critically ill  CV- down on levophed. 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

## 2019-08-17 NOTE — Progress Notes (Signed)
Patient ID: Todd [name], male   DOB: April 21, 1955, 64 y.o.   MRN: [id number]     Advanced Heart Failure Rounding Note  PCP-Cardiologist: No primary care provider on file.   Subjective:    08/02/19 CABG 08/03/19 VF arrest. Chest open at bedside 08/03/19 Back to OR for ECMO cannulation 08/03/19 Washout out for tamponade 08/04/19 Repeat bedside washout for tamponade 08/06/19 To OR for decannulation and Impella 5.5 08/07/19 Underwent emergent bedside chest exploration at bedside for tamponade  08/10/19 Chest closed 08/16/19 HIT positive, bivalirudin begun  Remains on vent and Impella 5.5, now at P3.  On bivalirudin gtt with HIT. LDH 328 => 370 => 389.   Epinephrine 2, milrinone 0.125, NE 16, vasopressin 0.04.  MAP stable.  Co-ox 65%.   Still with runs of NSVT.  Remains on amiodarone 30 mg/hr.  K is low.  He is back in NSR this morning, no atrial flutter.   Lasix gtt currently at 12 mg/hr, I/Os negative with good UOP (net negative), CVP 7-8.   Tm 101.1, WBCs 26=>21=>23.  He is on vancomycin, meropenem, anidulafungin. Few Candida in trach aspirate. PCT 2.35.   Ileus, improving.  Now getting tube feeds via post-pyloric tube.  Off TPN.   Creatinine 1.4 this morning.   Impella 5.5 P-3 Waveforms ok Flow 1.6 No alarms  Objective:   Weight Range: 83.9 kg Body mass index is 27.31 kg/m.   Vital Signs:   Temp:  [99.9 F (37.7 C)-101.3 F (38.5 C)] 100.6 F (38.1 C) (07/09 0702) Pulse Rate:  [82-112] 95 (07/09 0702) Resp:  [10-52] 23 (07/09 0702) BP: (87-121)/(40-88) 103/51 (07/09 0700) SpO2:  [90 %-99 %] 95 % (07/09 0719) FiO2 (%):  [40 %-50 %] 50 % (07/09 0720) Weight:  [83.9 kg] 83.9 kg (07/09 0609) Last BM Date: 08/15/19  Weight change: Filed Weights   08/15/19 0200 08/16/19 0500 08/17/19 0609  Weight: 81.4 kg 83.2 kg 83.9 kg    Intake/Output:   Intake/Output Summary (Last 24 hours) at 08/17/2019 0738 Last data filed at 08/17/2019 0700 Gross per 24 hour  Intake 3768.92 ml    Output 4635 ml  Net -866.08 ml      Physical Exam    General:Intubated/sedated Neck: No JVD, no thyromegaly or thyroid nodule.  Lungs: Clear to auscultation bilaterally with normal respiratory effort. CV: Nondisplaced PMI.  Heart regular S1/S2, no S3/S4, with Impella sounds.  1+ edema to thighs.    Abdomen: Soft, nontender, no hepatosplenomegaly, no distention.  Skin: Intact without lesions or rashes.  Neurologic: Sedated Extremities: No clubbing or cyanosis.  HEENT: Normal.    Telemetry   NSR 90s, short NSVT runs Personally reviewed   Labs    CBC Recent Labs    08/15/19 0520 08/15/19 0520 08/15/19 1214 08/16/19 0450  WBC 20.9*  --   --  23.4*  HGB 11.1*   < > 12.6* 11.1*  HCT 33.2*   < > 37.0* 33.2*  MCV 90.5  --   --  87.8  PLT 83*  --   --  108*   < > = values in this interval not displayed.   Basic Metabolic Panel Recent Labs    08/16/19 0450 08/16/19 0450 08/16/19 1512 08/16/19 1512 08/16/19 2000 08/17/19 0233  NA 138   < > 137  --   --  140  K 3.6   < > 3.4*   < > 3.9 3.2*  CL 97*   < > 96*  --   --  98  CO2 30   < > 31  --   --  32  GLUCOSE 172*   < > 236*  --   --  323*  BUN 34*   < > 38*  --   --  46*  CREATININE 1.27*   < > 1.42*  --   --  1.41*  CALCIUM 7.2*   < > 7.2*  --   --  7.3*  MG 1.8  --   --   --   --  2.0  PHOS 4.4  --  3.7  --   --   --    < > = values in this interval not displayed.   Liver Function Tests Recent Labs    08/16/19 0450 08/16/19 0450 08/16/19 1512 08/17/19 0233  AST 287*  --   --  308*  ALT 104*  --   --  135*  ALKPHOS 150*  --   --  183*  BILITOT 10.3*  --   --  9.3*  PROT 4.9*  --   --  5.2*  ALBUMIN 1.4*   < > 1.3* 1.4*   < > = values in this interval not displayed.   No results for input(s): LIPASE, AMYLASE in the last 72 hours. Cardiac Enzymes No results for input(s): CKTOTAL, CKMB, CKMBINDEX, TROPONINI in the last 72 hours.  BNP: BNP (last 3 results) Recent Labs    07/26/19 1236  BNP  2,568.2*    ProBNP (last 3 results) No results for input(s): PROBNP in the last 8760 hours.   D-Dimer No results for input(s): DDIMER in the last 72 hours. Hemoglobin A1C No results for input(s): HGBA1C in the last 72 hours. Fasting Lipid Panel Recent Labs    08/17/19 0233  TRIG 309*   Thyroid Function Tests No results for input(s): TSH, T4TOTAL, T3FREE, THYROIDAB in the last 72 hours.  Invalid input(s): FREET3  Other results:   Imaging    No results found.   Medications:     Scheduled Medications: . artificial tears   Both Eyes Q8H  . aspirin  81 mg Per Tube Daily  . chlorhexidine gluconate (MEDLINE KIT)  15 mL Mouth Rinse BID  . Chlorhexidine Gluconate Cloth  6 each Topical Daily  . collagenase   Topical Daily  . docusate  200 mg Per Tube Daily  . feeding supplement (PROSource TF)  45 mL Per Tube BID  . fentaNYL (SUBLIMAZE) injection  50 mcg Intravenous Once  . insulin aspart  0-24 Units Subcutaneous Q4H  . insulin aspart  3 Units Subcutaneous Q4H  . insulin detemir  10 Units Subcutaneous BID  . ipratropium  0.5 mg Nebulization Q6H  . levalbuterol  1.25 mg Nebulization Q6H  . mouth rinse  15 mL Mouth Rinse 10 times per day  . metoCLOPramide (REGLAN) injection  10 mg Intravenous Q8H  . pantoprazole (PROTONIX) IV  40 mg Intravenous QHS  . potassium chloride  40 mEq Per Tube Once  . rosuvastatin  20 mg Per Tube Daily  . sodium chloride flush  10-40 mL Intracatheter Q12H  . sodium chloride flush  10-40 mL Intracatheter Q12H  . sodium chloride flush  3 mL Intravenous Q12H    Infusions: . sodium chloride 250 mL (08/13/19 1412)  . sodium chloride Stopped (08/13/19 1631)  . amiodarone 30 mg/hr (08/17/19 0700)  . anidulafungin Stopped (08/16/19 6546)  . bivalirudin (ANGIOMAX) infusion 0.5 mg/mL (Non-ACS indications) 0.1 mg/kg/hr (08/17/19 0700)  . dextrose 5 %  Impella 5.0 Purge solution    . epinephrine 2 mcg/min (08/17/19 0700)  . feeding supplement (PIVOT  1.5 CAL) 1,000 mL (08/16/19 1813)  . fentaNYL infusion INTRAVENOUS 175 mcg/hr (08/17/19 0700)  . furosemide (LASIX) infusion 12 mg/hr (08/17/19 0700)  . lactated ringers 20 mL/hr at 08/10/19 1705  . lactated ringers Stopped (08/15/19 0725)  . meropenem (MERREM) IV 1 g (08/17/19 0606)  . midazolam Stopped (08/15/19 1759)  . milrinone 0.125 mcg/kg/min (08/17/19 0700)  . norepinephrine (LEVOPHED) Adult infusion 16 mcg/min (08/17/19 0700)  . propofol (DIPRIVAN) infusion Stopped (08/13/19 1420)  . vancomycin Stopped (08/16/19 1424)  . vasopressin 0.04 Units/min (08/17/19 0700)    PRN Medications: sodium chloride, sodium chloride, dextrose, fentaNYL, metoprolol tartrate, midazolam, morphine injection, ondansetron (ZOFRAN) IV, sodium chloride flush, sodium chloride flush, sodium chloride flush   Assessment/Plan   1. Shock: At this point, think mixed cardiogenic/septic.  Echo with EF 20-25%, moderate RV dysfunction.  VA ECMO post-VF arrest, cannulated 6/25.  Stable s/p return to OR for mediastinal re-exploration due to bleeding on 6/25.  Decannulated from [place] ECMO on 08/06/19. Impella 5.5 placed. Underwent emergent chest re-exploration on 6/29 for tamponade and clot removal. Chest closed 7/2.  This morning, mildly lower pressor support with epinephrine 2, NE, 16, milrinone 0.125, vasopressin 0.04.  Suspect component of septic/vasodilatory shock as well with fever, elevated WBCs.  Co-ox 65%, CVP 8. Good UOP with Lasix gtt 12 mg/hr yesterday.  Impella 5.5 at P3, LDH stable.  - Continue current pressors/inotropes, wean as able today. Continue to wean sedation today to see if this will help with pressor wean.  - Think primarily septic shock at this point, minimal Impella support now with stable co-ox.  Possible trigger for VT as well as infectious source, will watch at P3 this morning and remove in afternoon if he remains stable.  - Continue Lasix at 10 mg/hr for now.  2. CAD: 3VD, s/p CABG x 4 with  LIMA-LAD, SVG-D1, SVG-PDA, and radial-OM1 on 6/24.  Prolonged VF arrest on 6/25. Chest closed 7/2.  - ASA and Crestor.  3. Cardiac arrest: VF arrest with prolonged code. Still with episodes of VT/NSVT in setting of low K (difficult to effectively replace).  - Aggressive K replacement as above, recheck in pm.   - Continue IV amiodarone, can increase to 60 mg/hr.   - Eventual evaluation for ICD or lifevest at d/c - Keep K > 4.0 Mg > 2.0 4. Acute hypoxemic respiratory failure: In setting of cardiac arrest.  H/o COPD.  Bibasilar infiltrates, possible HCAP.   - On vent, CCM following, suspect will need trach.   - Getting vancomycin/meropenem/[name] for possible PNA.   5. Acute blood loss anemia: Now s/p mediastinal re-exploration x 3. Hgb stable today.  CT drainage has slowed.   - Now on bivalirudin gtt. 6. 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  7. ID: Suspect HCAP, on meropenem/vancomycin/anidulafungin.  WBCs 26 => 22 => 26 => 20 => 23. Tm 101.1. PCT 1.99 => 2.35 today.  Suspect component of septic shock. Cultures negative so far except few Candida in tracheal aspirate. 8. Elevated bilirubin: Mostly direct = likely cholestatic/shock liver/RV failure predominantly.  May have some hemolysis from pump but LDH has been stable 495 -> 469 -> 328 -> 370 -> 389.  Abdominal US with evidence for cirrhosis, sludge in GB but no definite acute cholecystitis.   9. Ileus: Improving.  Now getting tube feeds and off TPN. - Stop Reglan.   10. Atrial  fibrillation/flutter: Paroxysmal. Back in NSR now.    - Continue amiodarone gtt.  - bivalirudin gtt.   CRITICAL CARE Performed by: Loralie Champagne  Total critical care time: 40 minutes at bedside  Critical care time was exclusive of separately billable procedures and treating other patients.  Critical care was necessary to treat or prevent imminent or life-threatening deterioration.  Critical care was time spent personally by me on the following  activities: development of treatment plan with patient and/or surrogate as well as nursing, discussions with consultants, evaluation of patient's response to treatment, examination of patient, obtaining history from patient or surrogate, ordering and performing treatments and interventions, ordering and review of laboratory studies, ordering and review of radiographic studies, pulse oximetry and re-evaluation of patient's condition.   Length of Stay: [place], MD  08/17/2019, 7:38 AM  Advanced Heart Failure Team Pager [phone] (M-F; 7a - 4p)  Please contact [facility] for night-coverage after hours (4p -7a ) and weekends on [email or web address]

## 2019-08-17 NOTE — Anesthesia Preprocedure Evaluation (Addendum)
Anesthesia Evaluation  Patient identified by MRN, date of birth, ID band Patient unresponsive  General Assessment Comment:Intubated and sedated  Reviewed: Allergy & Precautions, H&P , NPO status , Patient's Chart, lab work & pertinent test results  Airway Mallampati: Intubated       Dental   Pulmonary Current Smoker,    breath sounds clear to auscultation       Cardiovascular hypertension, + CAD, + CABG and +CHF   Rhythm:regular Rate:Normal  08/02/19 CABG 08/03/19 VT arrest. Chest opened at bedside 08/03/19 To OR for ECMO cannulation 08/03/19 Washout for tamponade 08/04/19 Repeat washout for tamponade 08/06/19 To OR for decannulation and Impella 5.5 08/07/19 Underwent emergent bedside chest exploration at bedside for tamponade  08/10/19 Chest closed 08/16/19 HIT positive, bivalirudin begun   Neuro/Psych PSYCHIATRIC DISORDERS Depression    GI/Hepatic   Endo/Other  diabetes, Type 2Hypothyroidism   Renal/GU Renal InsufficiencyRenal disease     Musculoskeletal  (+) Arthritis ,   Abdominal   Peds  Hematology   Anesthesia Other Findings   Reproductive/Obstetrics                            Anesthesia Physical Anesthesia Plan  ASA: IV  Anesthesia Plan: General   Post-op Pain Management:    Induction: Intravenous  PONV Risk Score and Plan: 1 and Ondansetron, Dexamethasone and Treatment may vary due to age or medical condition  Airway Management Planned: Oral ETT  Additional Equipment: Arterial line  Intra-op Plan:   Post-operative Plan: Post-operative intubation/ventilation  Informed Consent: I have reviewed the patients History and Physical, chart, labs and discussed the procedure including the risks, benefits and alternatives for the proposed anesthesia with the patient or authorized representative who has indicated his/her understanding and acceptance.       Plan Discussed with: CRNA,  Anesthesiologist and Surgeon  Anesthesia Plan Comments:         Anesthesia Quick Evaluation

## 2019-08-17 NOTE — Progress Notes (Signed)
ANTICOAGULATION CONSULT NOTE - Follow Up Consult  Pharmacy Consult for heparin Indication: ECMO > impella  Labs: Recent Labs    08/15/19 0520 08/15/19 1200 08/15/19 1214 08/15/19 1445 08/16/19 0450 08/16/19 0450 08/16/19 1512 08/16/19 2000 08/16/19 2200 08/17/19 0233 08/17/19 0500 08/17/19 0752 08/17/19 0931  HGB 11.1*  --  12.6*   < > 11.1*  --   --   --   --   --   --  11.0*  --   HCT 33.2*  --  37.0*  --  33.2*  --   --   --   --   --   --  31.9*  --   PLT 83*  --   --   --  108*  --   --   --   --   --   --  156  --   APTT  --   --   --   --   --   --   --  76* 77*  --  82*  --   --   HEPARINUNFRC <0.10*  --   --   --  <0.10*  --   --   --   --   --   --   --   --   CREATININE 1.11   < > 1.30*   < > 1.27*   < > 1.42*  --   --  1.41*  --   --  1.38*   < > = values in this interval not displayed.    Assessment: 64yo male s/p ECMO, now on Impella. Chest closed 7/2. CT drainage decreased.    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 Has been on bivalirudin@0 .07 mg/kg/hr - shut off at noon for impella removal. Discussed with CTVS - okay to restart 12 hours after removal.   Goals of Therapy: APTT 50 -70 s  Plan: Restart bivalirudin@0 .07 mg/kg/hr on 7/10@0300  Will order aPTT 4 hr after restart SRA ordered Monitor daily aPTT, CBC, and for s/sx of bleeding  Sherron Monday, PharmD, BCCCP Clinical Pharmacist  Phone: [phone] 08/17/2019 3:20 PM  Please check AMION for all [facility] Pharmacy phone numbers After 10:00 PM, call Main Pharmacy [phone]

## 2019-08-17 NOTE — Op Note (Signed)
Todd [name], MCFARREN MEDICAL RECORD [id number] ACCOUNT [id number] DATE OF BIRTH:16-Jun-1955 FACILITY: [facility] LOCATION: [facility] PHYSICIAN:Leonidas Boateng C. Sarayu Prevost, MD  OPERATIVE REPORT  DATE OF PROCEDURE:  08/17/2019  PREOPERATIVE DIAGNOSIS:  Status post coronary artery bypass grafting, complicated by ventricular fibrillation arrest, status post ECMO and Impella placement.  POSTOPERATIVE DIAGNOSIS:  Status post coronary artery bypass grafting, complicated by ventricular fibrillation arrest, status post ECMO and Impella placement.  PROCEDURE:  Removal of Impella ventricular assist device.  SURGEON:  Charlett Lango, MD  ASSISTANT:  None.  ANESTHESIA:  General.  FINDINGS:  TEE showed severe left ventricular dysfunction with global hypokinesis.  Wall motion better in the lateral wall than anterior and inferior walls.  Mild aortic insufficiency.  CLINICAL NOTE:  [name] is a 64 year old gentleman who presented with heart failure.  He was found to have severe 3-vessel coronary artery disease with severe left ventricular dysfunction.  He underwent coronary artery bypass grafting, which was  complicated by a ventricular fibrillation arrest, requiring reopening of the chest and institution of ECMO.  He then was weaned from ECMO and an Impella left ventricular assist device was placed.  He now is on minimal mechanical support and the plan is to remove  the Impella assist device.  The indications, risks, benefits, and alternatives were discussed with the patient's wife.  DESCRIPTION OF PROCEDURE:  [name] was brought to the operating room on 08/17/2019.  He was already intubated.  Anesthesia placed an arterial blood pressure monitoring line.  The chest and abdomen were prepped and draped in the usual sterile fashion.   Transesophageal echocardiography was performed by Dr. Chaney Malling.  Please see his separately dictated note for full details.  There was global left ventricular dysfunction.  Wall motion  was better than the lateral than the inferior and septal walls.  There  was some thickening in the anterior wall.  The patient was on P3 support with a flow of 1.3 L per minute.  The incision over the graft insertion site into the axillary artery was opened.  There was some fluid, which was sent for culture.  The Impella  then was turned off and removed through the Hemashield graft.  The graft was clamped.  Transesophageal echocardiography showed no significant change in left ventricular or right ventricular function.  Post-removal, there was mild aortic insufficiency.   The patient was observed and remained stable, although he did require slight increase in his epinephrine infusion from 2 to 4 mcg per minute.  The graft then was stapled near its origin from the axillary artery.  This was done with a Covidien stapler  using a purple cartridge.  There was good hemostasis.  The excess graft was removed.  Both wounds were irrigated with warm saline and closed with a 2-0 Vicryl subcutaneous suture and skin staples.  The patient remained hemodynamically stable, then was  transported from the operating room back to the Surgical Intensive Care Unit, intubated and in critical condition.  All sponge, needle and instrument counts were correct.  VN/NUANCE  D:08/17/2019 T:08/17/2019 JOB:[id number]

## 2019-08-17 NOTE — Brief Op Note (Signed)
08/17/2019  3:12 PM  PATIENT:  Todd [name]  64 y.o. male  PRE-OPERATIVE DIAGNOSIS:  S/P CABG, VFib ARREST, ECMO, IMPELLA PLACEMENT  POST-OPERATIVE DIAGNOSIS:  S/P CABG, VFib ARREST, ECMO, IMPELLA PLACEMENT   PROCEDURE:  Procedure(s): REMOVAL OF IMPELLA LEFT VENTRICULAR ASSIST DEVICE (N/A) TRANSESOPHAGEAL ECHOCARDIOGRAM (TEE) (N/A)  SURGEON:  Surgeon(s) and Role:    * [name], MD - Primary  PHYSICIAN ASSISTANT:   ASSISTANTS: none   ANESTHESIA:   general  EBL:  30 mL   BLOOD ADMINISTERED:none  DRAINS: none   LOCAL MEDICATIONS USED:  NONE  SPECIMEN:  No Specimen  DISPOSITION OF SPECIMEN:  N/A  COUNTS:  YES  TOURNIQUET:  * No tourniquets in log *  DICTATION: .Other Dictation: Dictation Number -  PLAN OF CARE: Admit to inpatient   PATIENT DISPOSITION:  ICU - intubated and critically ill.   Delay start of Pharmacological VTE agent (>24hrs) due to surgical blood loss or risk of bleeding: restart Bivalrudin in 12 hours

## 2019-08-17 NOTE — Anesthesia Procedure Notes (Signed)
Arterial Line Insertion Start/End7/10/2019 1:44 PM, 08/17/2019 1:49 PM Performed by: Achille Rich, MD  Patient location: OR. Preanesthetic checklist: patient identified, IV checked, site marked, risks and benefits discussed, surgical consent, monitors and equipment checked, pre-op evaluation, timeout performed and anesthesia consent Patient sedated Right, brachial was placed Catheter size: 20 Fr Hand hygiene performed  and maximum sterile barriers used   Attempts: 1 Procedure performed using ultrasound guided technique. Ultrasound Notes:anatomy identified, needle tip was noted to be adjacent to the nerve/plexus identified and no ultrasound evidence of intravascular and/or intraneural injection Following insertion, dressing applied and Biopatch. Post procedure assessment: normal and unchanged  Patient tolerated the procedure well with no immediate complications.

## 2019-08-17 NOTE — Progress Notes (Signed)
NAME:  Todd [name], MRN:  [id number], DOB:  06/18/1955, LOS: 22 ADMISSION DATE:  07/26/2019, CONSULTATION DATE:  08/03/19 REFERRING MD:  Dorris Fetch, CHIEF COMPLAINT:  ECMO   Brief History   VA ECMO for post CABG Vtach arrest  History of present illness   Presented with worsening dyspnea 6/17 c/w CHF exacerbation Cath with severe triple vessel disease Underwent CABG 6/24 Early this AM Vtach arrest Chest opened bedside and cardiac massage initiated as well as multiple cardioversions, amiodarone, bicarb etc Brought to OR and cannulated for VA ECMO PCCM consulted to assist with management Comorbidities include DM, heavy smoking, COPD  Past Medical History  Depression Ischemic cardiomyopathy HTN HLD  Significant Hospital Events   6/24 CABG 6/25 VA cannulation 6/28 decannulated and impella placed 6/29 bedside re-exploration; s/p decannulation. Placement of impella device originally at p8 but decreased to p4 2/2 suction events overnight up to p6. Echo completed and repositioned device. Remained on considerable support with 8 epi and 46 norepi vaso 0.05. continued chest tube output with large clots noted. Heparin thru device. Replacement products ongoing. 60% 8 peep 6/30: bedside mediastinal re-exploration and clean out of hematoma with tamponade and worsening hemodynamics. Pt had large volume transfusion. rebolused with amio 2/2 nsvt episode.  Weight up 48 pounds.  Started diuresis, Lasix drip started. 7/1 iatrogenic respiratory alkalosis, vent rate decreased. 7/2: No significant issues overnight remains on pressors and Impella not tolerating tube feeds with high gastric output awaiting core track placement 7/3: started trickle TF  7/5: Swab removed, CVL and PICC placed.  Consults:  CHF, PCCM, TCTS  Procedures:  See below  Significant Diagnostic Tests:  6/22 spirometry with restrictive physiology, preserved FEV1/FVC CXR today with bilateral airspace disease, cannulas in good  position, deep left sulcus that was present yesterday, no clear PTX 6/18 echo: LVEF 20-25%, grade I diastolic dysfunction 7/7: US Abdomen cholelithiasis without cholecystitis. Mild wall thinking in setting of liver disease and ascites.   Micro Data:  COVID and HIV Neg  Antimicrobials:  Vanc6/24>> Cefuroxime 6/24>> 6/25 merrem 6/26-> Anidulafungin-> Interim history/subjective:   Persistently febrile overnight. Increased runs of Vtach as well.  Objective   Blood pressure (!) 103/51, pulse 95, temperature (!) 100.6 F (38.1 C), resp. rate (!) 23, height 5\' 9"  (1.753 m), weight 83.9 kg, SpO2 95 %.    Vent Mode: PRVC FiO2 (%):  [40 %-50 %] 50 % Set Rate:  [20 bmp] 20 bmp Vt Set:  [420 mL] 420 mL PEEP:  [8 cmH20] 8 cmH20 Plateau Pressure:  [18 cmH20-20 cmH20] 18 cmH20   Intake/Output Summary (Last 24 hours) at 08/17/2019 0738 Last data filed at 08/17/2019 0700 Gross per 24 hour  Intake 3768.92 ml  Output 4635 ml  Net -866.08 ml   Filed Weights   08/15/19 0200 08/16/19 0500 08/17/19 0609  Weight: 81.4 kg 83.2 kg 83.9 kg    Examination:  General: critically ill appearing male HEENT: ETT Cardiac: tachycardic rate, regular rhythm, gravity dependent pitting edema. Chest tubes draining Pulm: diffuse bilateral crackles appreciable GI: abd soft. bs active.  Extremities: warm Neuro: sedated RAAS -4 Skin: sternotomy with wound vac dressing  Resolved Hospital Problem list   N/A  Assessment & Plan:   Cardiogenic/hemorrhagic shock status post CABG complicated by VT arrest, EF 20 to 25% with severe LV dysfunction and moderate RV dysfunction.  On Epi 2, Norepi 16 , milrinone 0.125, vasopressin 0.04 , impella P-3 S/p VA ECMO (decanulated 6/28). S/p impella placement (6/28) -Has required reexploration x3  of the chest as well as massive volume of blood transfusion,  Atrial flutter. Currently on amiodarone. Cardiology on board Vtach. Increased runs overnight>amio increased to  60 Plan Amiodarone  Lasix drip Asa+statin Plan for impella removal today  Critical illness requiring sedation. Currently on fentanyl @175 . Bedside RN noting increased frequency of Vtach with wean. Hoping that this will improve with impella removal today so we can work towards sedation weaning.   Acute hypoxic respiratory failure w/ need for mechanical ventilation--vent day #13  Plan -Continue full vent support   AKI 2/2 shock. Stable from 7/8. Continue to monitor Hypokalemia. Repleted. Repeat BMP at 1600 per cardiology  Liver shock--2/2 shock. Monitor Hyperbilirubinemia--9.3 this morning. No CBD dilation on 7/8 9/8. Suspect a component of cholestasis of critical illness. Cirrhosis per 7/8 [place].   T2DM. Glucoses remain fairly elevated as of this morning. Increase lantus 10U>12U BID. Increase q4h novolog 3U>5U.   SIRS. Now on day #14 of merrem and vanc, day #3 of anidulafungin. Tmax overnight 101.3. WBC 23>17 from yesterday. no growth so far on 7/7 blood cultures. Question if critical illness as source of fever/white count?  Sputum culture + candida--high risk score for disseminated infection Plan --follow blood cultures --continue meropenem, vanc, anidulafungin  Acute blood loss anemia. hgb stable. Continue to monitor Thrombocytopenia--platelets now trending back up. Concern for HIT--serotonin assay in process. Will obtain lower extremity 9/7 studies to r/o VTE. Continue to hold heparin  High risk malnutrition continue Tube Feeds  Best practice:  Diet: Tube feeds Pain/Anxiety/Delirium protocol (if indicated): fentanyl VAP protocol (if indicated): in place DVT prophylaxis: bival GI prophylaxis: PPI Glucose control: see above Mobility: BR Code Status: Full Family Communication: per primary Disposition:  ICU  [place], MD PGY2

## 2019-08-17 NOTE — Transfer of Care (Signed)
Immediate Anesthesia Transfer of Care Note  Patient: Todd [name]  Procedure(s) Performed: REMOVAL OF IMPELLA LEFT VENTRICULAR ASSIST DEVICE (N/A ) TRANSESOPHAGEAL ECHOCARDIOGRAM (TEE) (N/A )  Patient Location: ICU  Anesthesia Type:General  Level of Consciousness: Patient remains intubated per anesthesia plan  Airway & Oxygen Therapy: Patient remains intubated per anesthesia plan and Patient placed on Ventilator (see vital sign flow sheet for setting)  Post-op Assessment: Report given to RN and Post -op Vital signs reviewed and stable  Post vital signs: Reviewed and stable  Last Vitals:  Vitals Value Taken Time  BP    Temp 37.3 C 08/17/19 1520  Pulse 96 08/17/19 1520  Resp 20 08/17/19 1520  SpO2 100 % 08/17/19 1520  Vitals shown include unvalidated device data.  Last Pain:  Vitals:   08/17/19 1230  TempSrc: Core  PainSc:       Patients Stated Pain Goal: 0 (08/10/19 0000)  Complications: No complications documented.

## 2019-08-17 NOTE — Progress Notes (Signed)
Patient's wife called and asked questions about tracheostomy, since doctor mentioned it to her today.  Questions answered and wife updated.

## 2019-08-18 ENCOUNTER — Inpatient Hospital Stay (HOSPITAL_COMMUNITY): Payer: Medicare Other

## 2019-08-18 DIAGNOSIS — R609 Edema, unspecified: Secondary | ICD-10-CM

## 2019-08-18 LAB — GLUCOSE, CAPILLARY
Glucose-Capillary: 191 mg/dL — ABNORMAL HIGH (ref 70–99)
Glucose-Capillary: 169 mg/dL — ABNORMAL HIGH (ref 70–99)
Glucose-Capillary: 176 mg/dL — ABNORMAL HIGH (ref 70–99)
Glucose-Capillary: 195 mg/dL — ABNORMAL HIGH (ref 70–99)
Glucose-Capillary: 185 mg/dL — ABNORMAL HIGH (ref 70–99)

## 2019-08-18 LAB — RENAL FUNCTION PANEL
Albumin: 1.4 g/dL — ABNORMAL LOW (ref 3.5–5.0)
Anion gap: 10 (ref 5–15)
BUN: 57 mg/dL — ABNORMAL HIGH (ref 8–23)
CO2: 32 mmol/L (ref 22–32)
Calcium: 7.5 mg/dL — ABNORMAL LOW (ref 8.9–10.3)
Chloride: 100 mmol/L (ref 98–111)
Creatinine, Ser: 1.41 mg/dL — ABNORMAL HIGH (ref 0.61–1.24)
GFR calc Af Amer: >60 mL/min (ref >60)
GFR calc non Af Amer: 52 mL/min — ABNORMAL LOW (ref >60)
Glucose, Bld: 214 mg/dL — ABNORMAL HIGH (ref 70–99)
Phosphorus: 4.6 mg/dL (ref 2.5–4.6)
Potassium: 3.1 mmol/L — ABNORMAL LOW (ref 3.5–5.1)
Sodium: 142 mmol/L (ref 135–145)

## 2019-08-18 LAB — CULTURE, BLOOD (ROUTINE X 2)
Culture: NO GROWTH
Culture: NO GROWTH
Special Requests: ADEQUATE
Special Requests: ADEQUATE

## 2019-08-18 LAB — APTT
aPTT: 63 seconds — ABNORMAL HIGH (ref 24–36)
aPTT: 71 seconds — ABNORMAL HIGH (ref 24–36)

## 2019-08-18 LAB — CBC
HCT: 30.9 % — ABNORMAL LOW (ref 39.0–52.0)
Hemoglobin: 10.4 g/dL — ABNORMAL LOW (ref 13.0–17.0)
MCH: 28.7 pg (ref 26.0–34.0)
MCHC: 33.7 g/dL (ref 30.0–36.0)
MCV: 85.4 fL (ref 80.0–100.0)
Platelets: 217 10*3/uL (ref 150–400)
RBC: 3.62 MIL/uL — ABNORMAL LOW (ref 4.22–5.81)
RDW: 18.7 % — ABNORMAL HIGH (ref 11.5–15.5)
WBC: 19 10*3/uL — ABNORMAL HIGH (ref 4.0–10.5)
nRBC: 0 % (ref 0.0–0.2)

## 2019-08-18 LAB — POCT I-STAT 7, (LYTES, BLD GAS, ICA,H+H)
Acid-Base Excess: 12 mmol/L — ABNORMAL HIGH (ref 0.0–2.0)
Bicarbonate: 37 mmol/L — ABNORMAL HIGH (ref 20.0–28.0)
Calcium, Ion: 1.05 mmol/L — ABNORMAL LOW (ref 1.15–1.40)
HCT: 33 % — ABNORMAL LOW (ref 39.0–52.0)
Hemoglobin: 11.2 g/dL — ABNORMAL LOW (ref 13.0–17.0)
O2 Saturation: 96 %
Patient temperature: 38
Potassium: 3.4 mmol/L — ABNORMAL LOW (ref 3.5–5.1)
Sodium: 145 mmol/L (ref 135–145)
TCO2: 39 mmol/L — ABNORMAL HIGH (ref 22–32)
pCO2 arterial: 53.4 mmHg — ABNORMAL HIGH (ref 32.0–48.0)
pH, Arterial: 7.452 — ABNORMAL HIGH (ref 7.350–7.450)
pO2, Arterial: 85 mmHg (ref 83.0–108.0)

## 2019-08-18 LAB — BASIC METABOLIC PANEL
Anion gap: 10 (ref 5–15)
Anion gap: 11 (ref 5–15)
BUN: 53 mg/dL — ABNORMAL HIGH (ref 8–23)
BUN: 55 mg/dL — ABNORMAL HIGH (ref 8–23)
CO2: 31 mmol/L (ref 22–32)
CO2: 32 mmol/L (ref 22–32)
Calcium: 7.5 mg/dL — ABNORMAL LOW (ref 8.9–10.3)
Calcium: 7.5 mg/dL — ABNORMAL LOW (ref 8.9–10.3)
Chloride: 100 mmol/L (ref 98–111)
Chloride: 99 mmol/L (ref 98–111)
Creatinine, Ser: 1.51 mg/dL — ABNORMAL HIGH (ref 0.61–1.24)
Creatinine, Ser: 1.46 mg/dL — ABNORMAL HIGH (ref 0.61–1.24)
GFR calc Af Amer: 55 mL/min — ABNORMAL LOW (ref >60)
GFR calc Af Amer: 58 mL/min — ABNORMAL LOW (ref >60)
GFR calc non Af Amer: 48 mL/min — ABNORMAL LOW (ref >60)
GFR calc non Af Amer: 50 mL/min — ABNORMAL LOW (ref >60)
Glucose, Bld: 227 mg/dL — ABNORMAL HIGH (ref 70–99)
Glucose, Bld: 209 mg/dL — ABNORMAL HIGH (ref 70–99)
Potassium: 4.3 mmol/L (ref 3.5–5.1)
Potassium: 3.8 mmol/L (ref 3.5–5.1)
Sodium: 141 mmol/L (ref 135–145)
Sodium: 142 mmol/L (ref 135–145)

## 2019-08-18 LAB — COOXEMETRY PANEL
Carboxyhemoglobin: 0.7 % (ref 0.5–1.5)
Methemoglobin: 0.8 % (ref 0.0–1.5)
O2 Saturation: 58.8 %
Total hemoglobin: 13 g/dL (ref 12.0–16.0)

## 2019-08-18 LAB — LACTATE DEHYDROGENASE: LDH: 480 U/L — ABNORMAL HIGH (ref 98–192)

## 2019-08-18 LAB — TRIGLYCERIDES: Triglycerides: 253 mg/dL — ABNORMAL HIGH (ref <150)

## 2019-08-18 MED ORDER — POTASSIUM CHLORIDE 20 MEQ PO PACK: 40.0000 meq | PACK | Freq: Two times a day (BID) | ORAL | Status: DC

## 2019-08-18 MED ORDER — POTASSIUM CHLORIDE 20 MEQ PO PACK
40.0000 meq | PACK | ORAL | Status: DC
  Administered 2019-08-18: 40 meq via ORAL
  Filled 2019-08-18: qty 2

## 2019-08-18 MED ORDER — POTASSIUM CHLORIDE 20 MEQ/15ML (10%) PO SOLN
40.0000 meq | Freq: Once | ORAL | Status: AC
  Administered 2019-08-19: 40 meq
  Filled 2019-08-18: qty 30

## 2019-08-18 MED ORDER — POTASSIUM CHLORIDE 20 MEQ/15ML (10%) PO SOLN
40.0000 meq | ORAL | Status: AC
  Administered 2019-08-18: 40 meq
  Filled 2019-08-18: qty 30

## 2019-08-18 MED ORDER — POTASSIUM CHLORIDE 20 MEQ/15ML (10%) PO SOLN
40.0000 meq | Freq: Two times a day (BID) | ORAL | Status: DC
  Administered 2019-08-18: 40 meq via ORAL

## 2019-08-18 MED ORDER — POTASSIUM CHLORIDE 20 MEQ/15ML (10%) PO SOLN
40.0000 meq | Freq: Two times a day (BID) | ORAL | Status: DC
  Administered 2019-08-19: 40 meq
  Filled 2019-08-18: qty 30

## 2019-08-18 NOTE — Progress Notes (Signed)
ANTICOAGULATION CONSULT NOTE - Follow Up Consult  Pharmacy Consult for heparin Indication: ECMO > impella  Labs: Recent Labs    08/16/19 0450 08/16/19 0450 08/16/19 1512 08/17/19 0500 08/17/19 0752 08/17/19 0931 08/17/19 1600 08/18/19 0001 08/18/19 0405 08/18/19 0650 08/18/19 1018  HGB 11.1*   < >  --   --  11.0*  --   --   --  10.4*  --   --   HCT 33.2*  --   --   --  31.9*  --   --   --  30.9*  --   --   PLT 108*  --   --   --  156  --   --   --  217  --   --   APTT  --   --    < > 82*  --   --   --   --   --  63* 71*  HEPARINUNFRC <0.10*  --   --   --   --   --   --   --   --   --   --   CREATININE 1.27*  --    < >  --   --    < > 1.41* 1.51* 1.46*  --   --    < > = values in this interval not displayed.    Assessment: 64 yo male s/p ECMO, now on Impella. Chest closed 7/2. CT drainage decreased.    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 Has been on bivalirudin@0 .07 mg/kg/hr - shut off yesterday for impella removal. Discussed with CTVS - okay to restart 12 hours after removal.   Aptt today within goal range.  No overt bleeding or complications noted.  Awaiting confirmatory SRA for HIT - in process (send-out lab)  Goals of Therapy: APTT 50 -70 s  Plan: Continue bivalirudin@0 .07 mg/kg/hr Daily aPTT. Monitor daily aPTT, CBC, and for s/sx of bleeding  Reece Leader, Colon Flattery, [facility] Clinical Pharmacist  08/18/2019 12:27 PM   [facility] pharmacy phone numbers are listed on [email or web address]

## 2019-08-18 NOTE — Anesthesia Postprocedure Evaluation (Signed)
Anesthesia Post Note  Patient: Todd [name]  Procedure(s) Performed: REMOVAL OF IMPELLA LEFT VENTRICULAR ASSIST DEVICE (N/A ) TRANSESOPHAGEAL ECHOCARDIOGRAM (TEE) (N/A )     Patient location during evaluation: SICU Anesthesia Type: General Level of consciousness: sedated Pain management: pain level controlled Vital Signs Assessment: post-procedure vital signs reviewed and stable Respiratory status: patient remains intubated per anesthesia plan Cardiovascular status: stable Postop Assessment: no apparent nausea or vomiting Anesthetic complications: no   No complications documented.  Last Vitals:  Vitals:   08/18/19 0745 08/18/19 0800  BP:  101/60  Pulse: (!) 101 94  Resp: 20 (!) 24  Temp: 37.9 C 37.8 C  SpO2: 94% 96%    Last Pain:  Vitals:   08/18/19 0800  TempSrc: Bladder  PainSc:                  Cree Kunert S

## 2019-08-18 NOTE — Progress Notes (Signed)
NAME:  Todd [name], MRN:  [id number], DOB:  June 06, 1955, LOS: 23 ADMISSION DATE:  07/26/2019, CONSULTATION DATE:  08/03/19 REFERRING MD:  Dorris Fetch, CHIEF COMPLAINT:  ECMO   Brief History   VA ECMO for post CABG Vtach arrest  History of present illness   Presented with worsening dyspnea 6/17 c/w CHF exacerbation Cath with severe triple vessel disease Underwent CABG 6/24 Early this AM Vtach arrest Chest opened bedside and cardiac massage initiated as well as multiple cardioversions, amiodarone, bicarb etc Brought to OR and cannulated for VA ECMO PCCM consulted to assist with management Comorbidities include DM, heavy smoking, COPD  Past Medical History  Depression Ischemic cardiomyopathy HTN HLD  Significant Hospital Events   6/24 CABG 6/25 VA cannulation 6/28 decannulated and impella placed 6/29 bedside re-exploration; s/p decannulation. Placement of impella device originally at p8 but decreased to p4 2/2 suction events overnight up to p6. Echo completed and repositioned device. Remained on considerable support with 8 epi and 46 norepi vaso 0.05. continued chest tube output with large clots noted. Heparin thru device. Replacement products ongoing. 60% 8 peep 6/30: bedside mediastinal re-exploration and clean out of hematoma with tamponade and worsening hemodynamics. Pt had large volume transfusion. rebolused with amio 2/2 nsvt episode.  Weight up 48 pounds.  Started diuresis, Lasix drip started. 7/1 iatrogenic respiratory alkalosis, vent rate decreased. 7/2: No significant issues overnight remains on pressors and Impella not tolerating tube feeds with high gastric output awaiting core track placement 7/3: started trickle TF  7/5: Swab removed, CVL and PICC placed.  Consults:  Advanced heart failure, PCCM, TCTS  Procedures:  See below  Significant Diagnostic Tests:  6/22 spirometry with restrictive physiology, preserved FEV1/FVC CXR today with bilateral airspace disease,  cannulas in good position, deep left sulcus that was present yesterday, no clear PTX 6/18 echo: LVEF 20-25%, grade I diastolic dysfunction 7/7: US Abdomen cholelithiasis without cholecystitis. Mild wall thinking in setting of liver disease and ascites.   Micro Data:  COVID and HIV Neg 7/3 respiratory-few Candida tropicalis 7/5 blood>> 7/5 respiratory-few Candida tropicalis 7/7 urine-NG 7/7 blood>> 7/9 wound>>   Antimicrobials:  Vanc6/24>> Cefuroxime 6/24>> 6/25 merrem 6/26-> Anidulafungin-> Interim history/subjective:  Impella removed 7/9 No episodes of VT overnight.  Midazolam off since yesterday.  Decreasing vasopressor requirements.  Has intermittently lifted his arms and opened his eyes per bedside RN.  Objective   Blood pressure 101/60, pulse 94, temperature 100 F (37.8 C), temperature source Bladder, resp. rate (!) 24, height 5\' 9"  (1.753 m), weight 80.8 kg, SpO2 96 %. CVP:  [11 mmHg] 11 mmHg  Vent Mode: PRVC FiO2 (%):  [60 %-80 %] 60 % Set Rate:  [20 bmp] 20 bmp Vt Set:  [420 mL] 420 mL PEEP:  [8 cmH20] 8 cmH20 Plateau Pressure:  [14 cmH20-22 cmH20] 20 cmH20   Intake/Output Summary (Last 24 hours) at 08/18/2019 1040 Last data filed at 08/18/2019 1000 Gross per 24 hour  Intake 4197.16 ml  Output 5330 ml  Net -1132.84 ml   Filed Weights   08/16/19 0500 08/17/19 0609 08/18/19 0331  Weight: 83.2 kg 83.9 kg 80.8 kg    Examination:  General: Critically ill-appearing elderly man lying in bed intubated, sedated HEENT: [place]/AT, eyes minimal icteric with scleral edema.  Oral mucosa moist with the ETT and OGT in place. Cardiac: Minimally tachycardic, regular rhythm with occasional PVCs. Pulm: Faint bilateral rales, no rhonchi.  Thin clear secretions from ETT. GI: Soft, nontender, nondistended Extremities: Anasarca, no wounds. Neuro: PERRL, RAAS -  4, cough, breathing above the vent Skin: No erythema around sternal wound.  Resolved Hospital Problem list    N/A  Assessment & Plan:   Cardiogenic/hemorrhagic shock status post CABG complicated by VT arrest, EF 20 to 25% with severe LV dysfunction and moderate RV dysfunction.  S/p VA ECMO (decanulated 6/28). S/p impella placement (6/28> 7/9) -Has required reexploration x3 of the chest as well as massive volume of blood transfusion Atrial flutter VT -Continue amiodarone -Continue aspirin and statin -Anticoagulation with bivalirudin due to HIT -Continue epi, norepi, vasopressin, milrinone -Continue Lasix infusion  Acute hypoxic respiratory failure w/ need for mechanical ventilation; intubated Plan -Continue low tidal volume ventilation, 4-8 cc/kg ideal body weight with goal plateau's and 30 driving pressure less than 15. -Sedation as required to maintain vent tolerance; goal RASS -1.  Midazolam discontinued 7/9.  AKI 2/2 shock-stable Hypokalemia-resolved -Continue to monitor electrolytes -Renally dose meds and avoid nephrotoxic meds -Strict I's/O  Liver shock--2/2 shock.  Hyperbilirubinemia Cirrhosis per 7/8 [place] -Continue to monitor LFTs.  T2DM -Continue Levemir and sliding scale insulin as needed -Goal BG 140-180 while admitted to the ICU   SIRS; unclear if source of infection.  Persistent fevers. Plan -Continue broad-spectrum antimicrobials -Continue to follow blood cultures to finalize -Lower extremity ultrasounds pending  Acute blood loss anemia-stable Thrombocytopenia--platelets improving since discontinuation of heparin.  -Serotonin release assay pending -Continue bivalirudin -Continue to monitor CBCs -Lower extremity ultrasounds pending  High risk malnutrition  -Continue Tube Feeds  Acute encephalopathy due to sedation -Agree with discontinuing midazolam.  Fentanyl as needed for pain control.  Best practice:  Diet: Tube feeds Pain/Anxiety/Delirium protocol (if indicated): fentanyl VAP protocol (if indicated): in place DVT prophylaxis: bival GI prophylaxis:  PPI Glucose control: see above Mobility: BR Code Status: Full Family Communication: per primary Disposition:  ICU      This patient is critically ill with multiple organ system failure which requires frequent high complexity decision making, assessment, support, evaluation, and titration of therapies. This was completed through the application of advanced monitoring technologies and extensive interpretation of multiple databases. During this encounter critical care time was devoted to patient care services described in this note for 35 minutes. Steffanie Dunn, DO 08/18/19 10:56 AM [facility]

## 2019-08-18 NOTE — Progress Notes (Signed)
eLink Physician-Brief Progress Note Patient Name: Todd [name] DOB: Jul 25, 1955 MRN: [id number]   Date of Service  08/18/2019  HPI/Events of Note  Notified of temp 101, request for cooling blanket. Already on vancomycin, meropenem and anidulafungin  eICU Interventions  Cooling blanket ordered     Intervention Category Minor Interventions: Routine modifications to care plan (e.g. PRN medications for pain, fever)  Rosalie Gums Elyzabeth Goatley 08/18/2019, 9:17 PM

## 2019-08-18 NOTE — Progress Notes (Signed)
Patient ID: Todd [name], male   DOB: [birth year], 64 y.o.   MRN: [id number]     Advanced Heart Failure Rounding Note  PCP-Cardiologist: No primary care provider on file.   Subjective:    08/02/19 CABG 08/03/19 VF arrest. 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  MAP stable.  Co-ox 59%.   Still with runs of NSVT when K low, up to 3.8 this morning.  Remains on amiodarone 60 mg/hr.  K is low.  He is back in atrial flutter this morning with controlled rate.   Lasix gtt currently at 10 mg/hr, I/Os negative with good UOP (net negative), CVP 11.  Weight down 6 lbs. CXR with bilateral lower lobe atelectasis.   Tm 101.3, WBCs 26=>21=>23=>19.  He is on vancomycin, meropenem, anidulafungin. Few Candida in trach aspirate. PCT 2.35.   Ileus, improving.  Now getting tube feeds via post-pyloric tube.  Off TPN.   Creatinine 1.46 this morning.   Objective:   Weight Range: 80.8 kg Body mass index is 26.31 kg/m.   Vital Signs:   Temp:  [98.8 F (37.1 C)-101.3 F (38.5 C)] 100.4 F (38 C) (07/10 0700) Pulse Rate:  [83-111] 93 (07/10 0729) Resp:  [16-39] 25 (07/10 0729) BP: (86-128)/(47-73) 116/57 (07/10 0729) SpO2:  [91 %-100 %] 95 % (07/10 0729) Arterial Line BP: (70-120)/(44-95) 120/54 (07/10 0700) FiO2 (%):  [60 %-80 %] 60 % (07/10 0729) Weight:  [80.8 kg] 80.8 kg (07/10 0331) Last BM Date: 08/17/19  Weight change: Filed Weights   08/16/19 0500 08/17/19 0609 08/18/19 0331  Weight: 83.2 kg 83.9 kg 80.8 kg    Intake/Output:   Intake/Output Summary (Last 24 hours) at 08/18/2019 0844 Last data filed at 08/18/2019  0800 Gross per 24 hour  Intake 4193.93 ml  Output 5110 ml  Net -916.07 ml      Physical Exam    General: Intubated/sedated Neck: JVP 10 cm, no thyromegaly or thyroid nodule.  Lungs: Decreased at bases.  CV: Nondisplaced PMI.  Heart regular S1/S2, no S3/S4, no murmur.  No peripheral edema.  No carotid bruit.  Normal pedal pulses.  Abdomen: Soft, no hepatosplenomegaly, no distention.  Skin: Jaundiced  Neurologic: Will move arms but not purposeful.  Extremities: No clubbing or cyanosis.  HEENT: Normal.    Telemetry   Atrial flutter around 90, short NSVT runs Personally reviewed   Labs    CBC Recent Labs    08/17/19 0752 08/18/19 0405  WBC 17.6* 19.0*  HGB 11.0* 10.4*  HCT 31.9* 30.9*  MCV 85.5 85.4  PLT 156 681   Basic Metabolic Panel Recent Labs    08/16/19 0450 08/16/19 0450 08/16/19 1512 08/16/19 2000 08/17/19 0233 08/17/19 0931 08/17/19 1600 08/17/19 1600 08/18/19 0001 08/18/19 0405  NA 138   < > 137  --  140   < > 140   < > 141 142  K 3.6   < > 3.4*   < > 3.2*   < > 3.4*   < > 4.3 3.8  CL 97*   < > 96*  --  98   < > 99   < > 100 99  CO2 30   < >  31  --  32   < > 32   < > 31 32  GLUCOSE 172*   < > 236*  --  323*   < > 102*   < > 227* 209*  BUN 34*   < > 38*  --  46*   < > 48*   < > 53* 55*  CREATININE 1.27*   < > 1.42*  --  1.41*   < > 1.41*   < > 1.51* 1.46*  CALCIUM 7.2*   < > 7.2*  --  7.3*   < > 7.5*   < > 7.5* 7.5*  MG 1.8  --   --   --  2.0  --   --   --   --   --   PHOS 4.4   < > 3.7  --   --   --  4.8*  --   --   --    < > = values in this interval not displayed.   Liver Function Tests Recent Labs    08/16/19 0450 08/16/19 1512 08/17/19 0233 08/17/19 1600  AST 287*  --  308*  --   ALT 104*  --  135*  --   ALKPHOS 150*  --  183*  --   BILITOT 10.3*  --  9.3*  --   PROT 4.9*  --  5.2*  --   ALBUMIN 1.4*   < > 1.4* 1.4*   < > = values in this interval not displayed.   No results for input(s): LIPASE, AMYLASE in the last 72  hours. Cardiac Enzymes No results for input(s): CKTOTAL, CKMB, CKMBINDEX, TROPONINI in the last 72 hours.  BNP: BNP (last 3 results) Recent Labs    07/26/19 1236  BNP 2,568.2*    ProBNP (last 3 results) No results for input(s): PROBNP in the last 8760 hours.   D-Dimer No results for input(s): DDIMER in the last 72 hours. Hemoglobin A1C No results for input(s): HGBA1C in the last 72 hours. Fasting Lipid Panel Recent Labs    08/18/19 0405  TRIG 253*   Thyroid Function Tests No results for input(s): TSH, T4TOTAL, T3FREE, THYROIDAB in the last 72 hours.  Invalid input(s): FREET3  Other results:   Imaging    DG Chest Port 1 View  Result Date: 08/18/2019 CLINICAL DATA:  Hypoxia EXAM: PORTABLE CHEST 1 VIEW COMPARISON:  August 15, 2019 FINDINGS: Endotracheal tube tip is 4.9 cm above the carina. Nasogastric tube tip and side port are below the diaphragm. Central catheter tips are in the superior vena cava. Temporary pacemaker wires are attached to the right heart. There is a chest tube on each side and a mediastinal drain. Impella device no longer appreciable. No pneumothorax. There are small pleural effusions with bibasilar atelectasis. Heart is upper normal in size with pulmonary vascularity normal. Patient is status post coronary artery bypass grafting. IMPRESSION: Tube and catheter positions as described without pneumothorax. Small pleural effusions bilaterally with bibasilar atelectasis. Stable cardiac silhouette. Electronically Signed   By: Lowella Grip III M.D.   On: 08/18/2019 08:10     Medications:     Scheduled Medications:  artificial tears   Both Eyes Q8H   aspirin  81 mg Per Tube Daily   chlorhexidine gluconate (MEDLINE KIT)  15 mL Mouth Rinse BID   Chlorhexidine Gluconate Cloth  6 each Topical Daily   collagenase   Topical Daily   docusate  200 mg Per Tube Daily  feeding supplement (PROSource TF)  45 mL Per Tube BID   fentaNYL (SUBLIMAZE)  injection  50 mcg Intravenous Once   insulin aspart  0-24 Units Subcutaneous Q4H   insulin aspart  5 Units Subcutaneous Q4H   insulin detemir  12 Units Subcutaneous BID   ipratropium  0.5 mg Nebulization Q6H   levalbuterol  1.25 mg Nebulization Q6H   mouth rinse  15 mL Mouth Rinse 10 times per day   pantoprazole (PROTONIX) IV  40 mg Intravenous QHS   potassium chloride  40 mEq Per Tube Daily   rosuvastatin  20 mg Per Tube Daily   sodium chloride flush  10-40 mL Intracatheter Q12H   sodium chloride flush  10-40 mL Intracatheter Q12H   sodium chloride flush  3 mL Intravenous Q12H    Infusions:  sodium chloride 250 mL (08/13/19 1412)   sodium chloride Stopped (08/13/19 1631)   amiodarone 60 mg/hr (08/18/19 0800)   anidulafungin 100 mg (08/18/19 0811)   bivalirudin (ANGIOMAX) infusion 0.5 mg/mL (Non-ACS indications) 0.07 mg/kg/hr (08/18/19 0800)   dextrose 5 % Impella 5.0 Purge solution     epinephrine 3 mcg/min (08/18/19 0800)   feeding supplement (PIVOT 1.5 CAL) 1,000 mL (08/17/19 1641)   fentaNYL infusion INTRAVENOUS 250 mcg/hr (08/18/19 0800)   furosemide (LASIX) infusion 10 mg/hr (08/18/19 0800)   lactated ringers 20 mL/hr at 08/10/19 1705   lactated ringers 10 mL/hr at 08/18/19 0054   meropenem (MERREM) IV 1 g (08/18/19 0557)   midazolam Stopped (08/15/19 1759)   milrinone 0.125 mcg/kg/min (08/18/19 0800)   norepinephrine (LEVOPHED) Adult infusion 28 mcg/min (08/18/19 0800)   propofol (DIPRIVAN) infusion Stopped (08/13/19 1420)   vancomycin 0 mL/hr at 08/16/19 1424   vasopressin 0.04 Units/min (08/18/19 0800)    PRN Medications: sodium chloride, sodium chloride, dextrose, fentaNYL, metoprolol tartrate, midazolam, morphine injection, ondansetron (ZOFRAN) IV, sodium chloride flush, sodium chloride flush, sodium chloride flush   Assessment/Plan   1. Shock: At this point, think mixed cardiogenic/septic.  Echo with EF 20-25%, moderate RV  dysfunction.  VA ECMO post-VF arrest, cannulated 6/25.  Stable s/p return to OR for mediastinal re-exploration due to bleeding on 6/25.  Decannulated from [place] ECMO on 08/06/19. Impella 5.5 placed. Underwent emergent chest re-exploration on 6/29 for tamponade and clot removal. Chest closed 7/2.  Impella 5.5 removed 7/9.  This morning, he is on epinephrine 2, NE 20, milrinone 0.125, vasopressin 0.04.  Suspect component of septic/vasodilatory shock as well with fever, elevated WBCs.  Co-ox 59%, CVP 11. Good UOP with Lasix gtt 10 mg/hr today, weight down.  - Continue current pressors/inotropes, wean as able today. Continue to wean sedation as much as possible today to see if this will help with pressor wean.  - Continue Lasix at 10 mg/hr for now.  2. 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  Prolonged VF arrest on 6/25. Chest closed 7/2.  - ASA  - Hold Crestor with elevated LFTs.  3. Cardiac arrest: VF arrest with prolonged code. Still with episodes of VT/NSVT in setting of low K (difficult to effectively replace).  - Aggressive K replacement as above, recheck in pm.   - Continue IV amiodarone at 60 mg/hr.   - Eventual evaluation for ICD or lifevest at d/c - Keep K > 4.0 Mg > 2.0 4. Acute hypoxemic respiratory failure: In setting of cardiac arrest.  H/o COPD.  Bibasilar infiltrates, possible HCAP.   - On vent, CCM following, suspect  will need trach.   - Getting vancomycin/meropenem/[name] for possible PNA.   5. Acute blood loss anemia: Now s/p mediastinal re-exploration x 3. Hgb stable today.  CT drainage has slowed.   - Now on bivalirudin gtt. 6. 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  Platelets have trended back up.  7. ID: Suspect HCAP, on meropenem/vancomycin/anidulafungin.  WBCs 26 => 22 => 26 => 20 => 23 => 19. Tm 101.3, persistently febrile for several days. PCT 1.99 => 2.35.  Suspect component of septic shock. Cultures negative so far except few  Candida in tracheal aspirate. 8. Elevated bilirubin: Mostly direct = likely cholestatic/shock liver/RV failure predominantly.   Abdominal US with evidence for cirrhosis, sludge in GB but no definite acute cholecystitis. - Continue to follow LFTs.    9. Ileus: Improving.  Now getting tube feeds and off TPN. 10. Atrial fibrillation/flutter: Paroxysmal. Back in rate-controlled atrial flutter today.     - Continue amiodarone gtt.  - bivalirudin gtt.   CRITICAL CARE Performed by: Loralie Champagne  Total critical care time: 40 minutes at bedside  Critical care time was exclusive of separately billable procedures and treating other patients.  Critical care was necessary to treat or prevent imminent or life-threatening deterioration.  Critical care was time spent personally by me on the following activities: development of treatment plan with patient and/or surrogate as well as nursing, discussions with consultants, evaluation of patient's response to treatment, examination of patient, obtaining history from patient or surrogate, ordering and performing treatments and interventions, ordering and review of laboratory studies, ordering and review of radiographic studies, pulse oximetry and re-evaluation of patient's condition.   Length of Stay: [place], MD  08/18/2019, 8:44 AM  Advanced Heart Failure Team Pager [phone] (M-F; 7a - 4p)  Please contact [facility] for night-coverage after hours (4p -7a ) and weekends on [email or web address]

## 2019-08-18 NOTE — Progress Notes (Signed)
Patient's sister in-law called and was updated. All questions answered.

## 2019-08-18 NOTE — Progress Notes (Signed)
Patient's wife Synetta Fail called and was updated. All questions answered and discussed care.

## 2019-08-18 NOTE — Progress Notes (Signed)
[address]       [place] [address]             [phone]                 1 Day Post-Op Procedure(s) (LRB): REMOVAL OF IMPELLA LEFT VENTRICULAR ASSIST DEVICE (N/A) TRANSESOPHAGEAL ECHOCARDIOGRAM (TEE) (N/A)   Events: One run of Vtach overnight Random PVCs with stimulation  _______________________________________________________________ Vitals: BP (!) 116/57   Pulse 93   Temp (!) 100.4 F (38 C)   Resp (!) 25   Ht 5\' 9"  (1.753 m)   Wt 80.8 kg   SpO2 95%   BMI 26.31 kg/m   - Neuro: sedated.  - Cardiovascular: sinus  Drips: amio 60, epi 3, milr 0.125, levo 28, vaso 0.04, lasix 10.      - Pulm: coarse B.  SS CT output Vent Mode: PRVC FiO2 (%):  [60 %-80 %] 60 % Set Rate:  [20 bmp] 20 bmp Vt Set:  [420 mL] 420 mL PEEP:  [8 cmH20] 8 cmH20 Plateau Pressure:  [14 cmH20-22 cmH20] 20 cmH20  ABG    Component Value Date/Time   PHART 7.370 08/14/2019 0226   PCO2ART 60.6 (H) 08/14/2019 0226   PO2ART 90 08/14/2019 0226   HCO3 34.7 (H) 08/14/2019 0226   TCO2 34 (H) 08/15/2019 1214   ACIDBASEDEF 1.0 08/08/2019 1115   O2SAT 58.8 08/18/2019 0405    - Abd: soft - Extremity: warm  .Intake/Output      07/09 0701 - 07/10 0700 07/10 0701 - 07/11 0700   I.V. (mL/kg) 2727.9 (33.8) 136.6 (1.7)   Other 29.6    NG/GT 956    IV Piggyback 534.6    Total Intake(mL/kg) 4248.1 (52.6) 136.6 (1.7)   Urine (mL/kg/hr) 3295 (1.7) 350 (2.9)   Emesis/NG output 150    Drains 0    Stool 250    Blood 30    Chest Tube 1230    Total Output 4955 350   Net -706.9 -213.4           _______________________________________________________________ Labs: CBC Latest Ref Rng & Units 08/18/2019 08/17/2019 08/16/2019  WBC 4.0 - 10.5 K/uL 19.0(H) 17.6(H) 23.4(H)  Hemoglobin 13.0 - 17.0 g/dL 10.4(L) 11.0(L) 11.1(L)  Hematocrit 39 - 52 % 30.9(L) 31.9(L) 33.2(L)  Platelets 150 - 400 K/uL 217 156 108(L)   CMP Latest Ref Rng & Units 08/18/2019 08/18/2019 08/17/2019  Glucose  70 - 99 mg/dL 10/18/2019) 500(X) 381(W)  BUN 8 - 23 mg/dL 299(B) 71(I) 96(V)  Creatinine 0.61 - 1.24 mg/dL 89(F) 8.10(F) 7.51(W)  Sodium 135 - 145 mmol/L 142 141 140  Potassium 3.5 - 5.1 mmol/L 3.8 4.3 3.4(L)  Chloride 98 - 111 mmol/L 99 100 99  CO2 22 - 32 mmol/L 32 31 32  Calcium 8.9 - 10.3 mg/dL 7.5(L) 7.5(L) 7.5(L)  Total Protein 6.5 - 8.1 g/dL - - -  Total Bilirubin 0.3 - 1.2 mg/dL - - -  Alkaline Phos 38 - 126 U/L - - -  AST 15 - 41 U/L - - -  ALT 0 - 44 U/L - - -    CXR: stable  _______________________________________________________________  Assessment and Plan: s/p CABG, Vfib arrest, s/p ECMO, and impella placement.  Impella removed on 7/9.ECMO decannulation on 6/28  Neuro: sedated.  Wean as tolerated CV: on multiple drips.  Will wean levophed as tolerated.  Will keep milr and vaso.  Once levo is lower will continue weaning epi  Pulm: continue vent support Renal: good uop, on lasix gtt.  Creat stable GI: on goal tube feeds Heme: plts 217.  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   ID: WBC 19.  On merrem, vanc, and eraxis Endo: on insulin Dispo: continue ICU care  Brynda Greathouse, MD 08/18/2019 8:31 AM

## 2019-08-18 NOTE — Progress Notes (Signed)
VASCULAR LAB    Bilateral lower extremity venous duplex completed.    Preliminary report:  See CV proc for preliminary results.  Desteni Piscopo, RVT 08/18/2019, 5:06 PM

## 2019-08-19 ENCOUNTER — Inpatient Hospital Stay (HOSPITAL_COMMUNITY): Payer: Medicare Other

## 2019-08-19 ENCOUNTER — Encounter (HOSPITAL_COMMUNITY): Payer: Self-pay | Admitting: Thoracic Surgery (Cardiothoracic Vascular Surgery)

## 2019-08-19 DIAGNOSIS — Z951 Presence of aortocoronary bypass graft: Secondary | ICD-10-CM

## 2019-08-19 LAB — BASIC METABOLIC PANEL
Anion gap: 12 (ref 5–15)
BUN: 62 mg/dL — ABNORMAL HIGH (ref 8–23)
CO2: 34 mmol/L — ABNORMAL HIGH (ref 22–32)
Calcium: 7.8 mg/dL — ABNORMAL LOW (ref 8.9–10.3)
Chloride: 100 mmol/L (ref 98–111)
Creatinine, Ser: 1.49 mg/dL — ABNORMAL HIGH (ref 0.61–1.24)
GFR calc Af Amer: 56 mL/min — ABNORMAL LOW (ref >60)
GFR calc non Af Amer: 49 mL/min — ABNORMAL LOW (ref >60)
Glucose, Bld: 136 mg/dL — ABNORMAL HIGH (ref 70–99)
Potassium: 3.2 mmol/L — ABNORMAL LOW (ref 3.5–5.1)
Sodium: 146 mmol/L — ABNORMAL HIGH (ref 135–145)

## 2019-08-19 LAB — COMPREHENSIVE METABOLIC PANEL
ALT: 288 U/L — ABNORMAL HIGH (ref 0–44)
AST: 650 U/L — ABNORMAL HIGH (ref 15–41)
Albumin: 1.5 g/dL — ABNORMAL LOW (ref 3.5–5.0)
Alkaline Phosphatase: 590 U/L — ABNORMAL HIGH (ref 38–126)
Anion gap: 11 (ref 5–15)
BUN: 61 mg/dL — ABNORMAL HIGH (ref 8–23)
CO2: 35 mmol/L — ABNORMAL HIGH (ref 22–32)
Calcium: 7.9 mg/dL — ABNORMAL LOW (ref 8.9–10.3)
Chloride: 100 mmol/L (ref 98–111)
Creatinine, Ser: 1.53 mg/dL — ABNORMAL HIGH (ref 0.61–1.24)
GFR calc Af Amer: 54 mL/min — ABNORMAL LOW (ref >60)
GFR calc non Af Amer: 47 mL/min — ABNORMAL LOW (ref >60)
Glucose, Bld: 189 mg/dL — ABNORMAL HIGH (ref 70–99)
Potassium: 3.3 mmol/L — ABNORMAL LOW (ref 3.5–5.1)
Sodium: 146 mmol/L — ABNORMAL HIGH (ref 135–145)
Total Bilirubin: 12.1 mg/dL — ABNORMAL HIGH (ref 0.3–1.2)
Total Protein: 6 g/dL — ABNORMAL LOW (ref 6.5–8.1)

## 2019-08-19 LAB — CBC
HCT: 29.7 % — ABNORMAL LOW (ref 39.0–52.0)
Hemoglobin: 10.2 g/dL — ABNORMAL LOW (ref 13.0–17.0)
MCH: 29.2 pg (ref 26.0–34.0)
MCHC: 34.3 g/dL (ref 30.0–36.0)
MCV: 85.1 fL (ref 80.0–100.0)
Platelets: 236 10*3/uL (ref 150–400)
RBC: 3.49 MIL/uL — ABNORMAL LOW (ref 4.22–5.81)
RDW: 19.7 % — ABNORMAL HIGH (ref 11.5–15.5)
WBC: 14.4 10*3/uL — ABNORMAL HIGH (ref 4.0–10.5)
nRBC: 0 % (ref 0.0–0.2)

## 2019-08-19 LAB — COOXEMETRY PANEL
Carboxyhemoglobin: 1.3 % (ref 0.5–1.5)
Methemoglobin: 1.3 % (ref 0.0–1.5)
O2 Saturation: 56.6 %
Total hemoglobin: 9.3 g/dL — ABNORMAL LOW (ref 12.0–16.0)

## 2019-08-19 LAB — APTT
aPTT: 76 seconds — ABNORMAL HIGH (ref 24–36)
aPTT: 74 seconds — ABNORMAL HIGH (ref 24–36)

## 2019-08-19 LAB — GLUCOSE, CAPILLARY
Glucose-Capillary: 106 mg/dL — ABNORMAL HIGH (ref 70–99)
Glucose-Capillary: 132 mg/dL — ABNORMAL HIGH (ref 70–99)
Glucose-Capillary: 165 mg/dL — ABNORMAL HIGH (ref 70–99)
Glucose-Capillary: 178 mg/dL — ABNORMAL HIGH (ref 70–99)
Glucose-Capillary: 186 mg/dL — ABNORMAL HIGH (ref 70–99)
Glucose-Capillary: 74 mg/dL (ref 70–99)

## 2019-08-19 LAB — TRIGLYCERIDES: Triglycerides: 214 mg/dL — ABNORMAL HIGH (ref <150)

## 2019-08-19 LAB — MAGNESIUM: Magnesium: 2.2 mg/dL (ref 1.7–2.4)

## 2019-08-19 LAB — LACTATE DEHYDROGENASE: LDH: 453 U/L — ABNORMAL HIGH (ref 98–192)

## 2019-08-19 LAB — VANCOMYCIN, TROUGH: Vancomycin Tr: 21 ug/mL (ref 15–20)

## 2019-08-19 MED ORDER — POTASSIUM CHLORIDE 20 MEQ/15ML (10%) PO SOLN
40.0000 meq | Freq: Two times a day (BID) | ORAL | Status: DC
  Administered 2019-08-20 – 2019-08-29 (×19): 40 meq
  Filled 2019-08-19 (×22): qty 30

## 2019-08-19 MED ORDER — GERHARDT'S BUTT CREAM: TOPICAL_CREAM | Freq: Every day | CUTANEOUS | Status: DC

## 2019-08-19 MED ORDER — POTASSIUM CHLORIDE 10 MEQ/50ML IV SOLN
10.0000 meq | INTRAVENOUS | Status: AC
  Administered 2019-08-19 (×3): 10 meq via INTRAVENOUS
  Filled 2019-08-19 (×3): qty 50

## 2019-08-19 MED ORDER — VANCOMYCIN HCL 750 MG/150ML IV SOLN: 750.0000 mg | INTRAVENOUS | Status: DC

## 2019-08-19 MED ORDER — POTASSIUM CHLORIDE 20 MEQ/15ML (10%) PO SOLN
20.0000 meq | ORAL | Status: AC
  Administered 2019-08-19 (×3): 20 meq
  Filled 2019-08-19 (×3): qty 15

## 2019-08-19 MED ORDER — POTASSIUM CHLORIDE 20 MEQ/15ML (10%) PO SOLN
40.0000 meq | Freq: Once | ORAL | Status: AC
  Administered 2019-08-19: 40 meq
  Filled 2019-08-19: qty 30

## 2019-08-19 NOTE — Progress Notes (Signed)
Pharmacy Antibiotic Note  Maude Gloor is a 64 y.o. male admitted on 07/26/2019 s/p CABG c/b cardiac arrest requiring ECMO cannulation> de-cannulated to impella 5.5.  Pt started on vancomycin and meropenem with open chest - chest closed 7/2  - last dose vanc 7/2, last dose meropenem 7/4.  Still having fevers with tmax of 100.9, WBC 14, Scr trending up to 1.49 (CrCl 50 mL/min). Started on eraxis on 7/7 given was on TPN and few yeast growing in cx - has risk factors for candidemia.   Vancomycin trough collected this afternoon 7/11 resulted just above goal at 21. Will make slight dose adjustment and retime for tonight.   Plan: Reduce vancomycin dose to 750g IV every 24 hours, retime for later tonight Continue meropenem 1gm IV q8h Continue [name] 100 mg IV every 24 hours Repeat vancomycin levels at steady state later this week  Height: 5\' 9"  (175.3 cm) Weight: 79.7 kg (175 lb 11.3 oz) IBW/kg (Calculated) : 70.7  Temp (24hrs), Avg:100.4 F (38 C), Min:97.7 F (36.5 C), Max:100.9 F (38.3 C)  Recent Labs  Lab 08/15/19 0520 08/15/19 1200 08/15/19 1445 08/15/19 2338 08/16/19 0450 08/16/19 1512 08/17/19 0752 08/17/19 0931 08/18/19 0001 08/18/19 0405 08/18/19 1600 08/19/19 0345 08/19/19 1445  WBC 20.9*  --   --   --  23.4*  --  17.6*  --   --  19.0*  --  14.4*  --   CREATININE 1.11   < > 1.17   < > 1.27*   < >  --    < > 1.51* 1.46* 1.41* 1.53* 1.49*  VANCOTROUGH  --   --  19  --   --   --   --   --   --   --   --   --  21*   < > = values in this interval not displayed.    Estimated Creatinine Clearance: 49.4 mL/min (A) (by C-G formula based on SCr of 1.49 mg/dL (H)).    Allergies  Allergen Reactions   Empagliflozin     Other reaction(s): diarrhea   Heparin Other (See Comments)    HIT ab positive 7/8, SRA pending   Other     Other reaction(s): Unknown   Simvastatin     Other reaction(s): diarrhea   Sitagliptin     Other reaction(s): diarrhea/abd pain     Antimicrobials this admission: Cefuroxime 6/23 > 6/25 Vancomycin 6/24 > 6/24; resume 6/26 > 7/2 Meropenem 6/26 >> 7/4   Dose adjustments this dosing regimen:  6/30 VR 33, 7/1 22 (ke 0.026, T1/2 26.6) - change to vancomycin 1750 mg q36 7/7 VT = 19 (drawn prior to 3rd dose) - reduce to 1g IV every 24 hours 7/11 VT = 21 - reduce to 750mg  IV q24 hours  Microbiology results:  7/3+7/5 TA - few yeast> few candida topicalis  7/5 + 7/7 Bcx - ngtd 7/9 Wound Cx > pending 7/9 BCx > ngtd  9/9 PharmD., BCPS Clinical Pharmacist 08/19/2019 4:25 PM  Please check AMION for all [facility] phone numbers After 10:00 PM, call Main Pharmacy [phone]

## 2019-08-19 NOTE — Progress Notes (Signed)
Patient transported on vent to CT and returned to 2H02 without complications. 

## 2019-08-19 NOTE — Progress Notes (Signed)
ANTICOAGULATION CONSULT NOTE - Follow Up Consult  Pharmacy Consult for heparin Indication: ECMO > impella  Labs: Recent Labs    08/17/19 0752 08/17/19 0931 08/18/19 0405 08/18/19 0405 08/18/19 0650 08/18/19 1018 08/18/19 1241 08/18/19 1600 08/19/19 0345 08/19/19 1222  HGB 11.0*   < > 10.4*   < >  --   --  11.2*  --  10.2*  --   HCT 31.9*   < > 30.9*  --   --   --  33.0*  --  29.7*  --   PLT 156  --  217  --   --   --   --   --  236  --   APTT  --   --   --   --    < > 71*  --   --  76* 74*  CREATININE  --    < > 1.46*  --   --   --   --  1.41* 1.53*  --    < > = values in this interval not displayed.    Assessment: 64 yo male s/p ECMO, now on Impella. Chest closed 7/2. CT drainage decreased.    HIT ab sent a few days ago and returned as 2.6 - positive, switched from heparin to bivalirudin.  Aptt this afternoon remains slightly above the established lower goal range (74).  No overt bleeding or complications noted.  Awaiting confirmatory SRA for HIT - in process (send-out lab)  Goals of Therapy: APTT 50 -70 s  Plan: Decrease bivalirudin to 0.055 mg/kg/hr Daily aPTT. Monitor daily aPTT, CBC, and for s/sx of bleeding  Reece Leader, Colon Flattery, [facility] Clinical Pharmacist  08/19/2019 1:01 PM   [facility] pharmacy phone numbers are listed on [email or web address]

## 2019-08-19 NOTE — Progress Notes (Signed)
Patient taken to CT for head scan with no incident.

## 2019-08-19 NOTE — Progress Notes (Signed)
CRITICAL VALUE ALERT  Critical Value:  Vancomycin trough  Date & Time Notied: 08/19/2019 1530  Provider Notified: pharmacist notified  Orders Received/Actions taken: Vancomycin held

## 2019-08-19 NOTE — Progress Notes (Signed)
NAME:  Todd [name], MRN:  [id number], DOB:  02/08/1956, LOS: 24 ADMISSION DATE:  07/26/2019, CONSULTATION DATE:  08/03/19 REFERRING MD:  Dorris Fetch, CHIEF COMPLAINT:  ECMO   Brief History   VA ECMO for post CABG Vtach arrest  History of present illness   Presented with worsening dyspnea 6/17 c/w CHF exacerbation Cath with severe triple vessel disease Underwent CABG 6/24 Vtach arrest 6/25 Chest opened bedside and cardiac massage initiated as well as multiple cardioversions, amiodarone, bicarb etc Brought to OR and cannulated for VA ECMO PCCM consulted to assist with management Comorbidities include DM, heavy smoking, COPD  Past Medical History  Depression Ischemic cardiomyopathy HTN HLD  Significant Hospital Events   6/24 CABG 6/25 VA cannulation 6/28 decannulated and impella placed 6/29 bedside re-exploration; s/p decannulation. Placement of impella device originally at p8 but decreased to p4 2/2 suction events overnight up to p6. Echo completed and repositioned device. Remained on considerable support with 8 epi and 46 norepi vaso 0.05. continued chest tube output with large clots noted. Heparin thru device. Replacement products ongoing. 60% 8 peep 6/30: bedside mediastinal re-exploration and clean out of hematoma with tamponade and worsening hemodynamics. Pt had large volume transfusion. rebolused with amio 2/2 nsvt episode.  Weight up 48 pounds.  Started diuresis, Lasix drip started. 7/1 iatrogenic respiratory alkalosis, vent rate decreased. 7/2: No significant issues overnight remains on pressors and Impella not tolerating tube feeds with high gastric output awaiting core track placement 7/3: started trickle TF  7/5: Swab removed, CVL and PICC placed.  Consults:  Advanced heart failure, PCCM, TCTS  Procedures:  See below  Significant Diagnostic Tests:  6/22 spirometry with restrictive physiology, preserved FEV1/FVC CXR today with bilateral airspace disease, cannulas in  good position, deep left sulcus that was present yesterday, no clear PTX 6/18 echo: LVEF 20-25%, grade I diastolic dysfunction 7/7: US Abdomen cholelithiasis without cholecystitis. Mild wall thinking in setting of liver disease and ascites.   Micro Data:  COVID and HIV Neg 7/3 respiratory-few Candida tropicalis 7/5 blood>> 7/5 respiratory-few Candida tropicalis 7/7 urine-NG 7/7 blood>> 7/9 wound>>   Antimicrobials:  Vanc6/24>> Cefuroxime 6/24>> 6/25 merrem 6/26-> Anidulafungin 7/8-> Interim history/subjective:  Not following commands and does not respond to painful stimuli No episodes of VT overnight.  Vasopressors are being adjusted to maintain perfusion to vital organs.   Objective   Blood pressure (!) 87/60, pulse 89, temperature 98.4 F (36.9 C), temperature source Bladder, resp. rate (!) 26, height 5\' 9"  (1.753 m), weight 79.7 kg, SpO2 99 %.    Vent Mode: PRVC FiO2 (%):  [40 %-50 %] 50 % Set Rate:  [20 bmp] 20 bmp Vt Set:  [420 mL] 420 mL PEEP:  [8 cmH20] 8 cmH20   Intake/Output Summary (Last 24 hours) at 08/19/2019 0932 Last data filed at 08/19/2019 0700 Gross per 24 hour  Intake 4054.92 ml  Output 5690 ml  Net -1635.08 ml   Filed Weights   08/18/19 0331 08/19/19 0328 08/19/19 0735  Weight: 80.8 kg 79.7 kg 79.7 kg    Examination:  General: Critically ill-appearing elderly man lying in bed orally intubated HEENT: [place]/AT, eyes minimal icteric with scleral edema.  Oral mucosa moist with the ETT and OGT in place. Cardiac: Regular rhythm with occasional PVCs. Pulm: Faint bilateral rales, no rhonchi.  Thin clear secretions from ETT. GI: Soft, nontender, nondistended Extremities: Anasarca, no wounds. Neuro: Opens eyes with vocal stimuli, disconjugate gaze with left eye directed laterally, not following commands. Skin: No erythema around  sternal wound.  Resolved Hospital Problem list   N/A  Assessment & Plan:   Cardiogenic/hemorrhagic shock status post CABG  complicated by VT arrest, EF 20 to 25% with severe LV dysfunction and moderate RV dysfunction.  S/p VA ECMO (decanulated 6/28). 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 -AdjustED ventilatory setting to prevent hypoxemia and hypercapnia -Sedation as required to maintain vent tolerance; goal RASS -1.  Continue fentanyl  AKI 2/2 shock-stable Hypokalemia -Electrolyte supplemented -Renally dose meds and avoid nephrotoxic meds -Strict I's/O  Liver shock--2/2 shock.  Hyperbilirubinemia Cirrhosis per 7/8 [place] -LFTs are up trending -Continue to monitor  T2DM -Continue Levemir and sliding scale insulin as needed -Goal BG 140-180 while admitted to the ICU   SIRS; unclear if source of infection.  Persistent fevers. Plan -Continue broad-spectrum antimicrobials with vancomycin, meropenem and antifungal -Continue to follow blood cultures to finalize -Lower extremity ultrasounds pending  Acute blood loss anemia-stable Thrombocytopenia--platelets improving since discontinuation of heparin.  -Serotonin release assay in process -Continue bivalirudin -Continue to monitor CBCs -Lower extremity ultrasounds pending  High risk malnutrition  -Continue Tube Feeds  Acute encephalopathy due to sedation -Continue fentanyl infusion -Get head CT to rule out structural abnormalities  Best practice:  Diet: Tube feeds Pain/Anxiety/Delirium  protocol (if indicated): fentanyl VAP protocol (if indicated): in place DVT prophylaxis: bival GI prophylaxis: PPI Glucose control: see above Mobility: BR Code Status: Full Family Communication: per primary Disposition:  ICU    Total critical care time: 37 minutes  Performed by: Cheri Fowler   Critical care time was exclusive of separately billable procedures and treating other patients. Critical care was necessary to treat or prevent imminent or life-threatening deterioration.   Critical care was time spent personally by me on the following activities: development of treatment plan with patient and/or surrogate as well as nursing, discussions with consultants, evaluation of patient's response to treatment, examination of patient, obtaining history from patient or surrogate, ordering and performing treatments and interventions, ordering and review of laboratory studies, ordering and review of radiographic studies, pulse oximetry and re-evaluation of patient's condition.   Cheri Fowler MD Critical care physician [facility] [place] [facility]  Pager: [phone] Mobile: [phone]

## 2019-08-19 NOTE — Progress Notes (Signed)
[address]       [place] [address]             [phone]                 2 Days Post-Op Procedure(s) (LRB): REMOVAL OF IMPELLA LEFT VENTRICULAR ASSIST DEVICE (N/A) TRANSESOPHAGEAL ECHOCARDIOGRAM (TEE) (N/A)   Events: No events overnight Afebrile   _______________________________________________________________ Vitals: BP (!) 87/60   Pulse 89   Temp 98.4 F (36.9 C) (Bladder)   Resp (!) 26   Ht 5\' 9"  (1.753 m)   Wt 79.7 kg   SpO2 99%   BMI 25.95 kg/m   - Neuro: eyes open, intermittently following commands.  Tracks on the left, not on the right  - Cardiovascular: sinus  Drips: amio 60, epi 2, milr 0.125, levo 12, vaso 0.04, lasix 8.      - Pulm: coarse B.  SS CT output Vent Mode: PRVC FiO2 (%):  [40 %-50 %] 50 % Set Rate:  [20 bmp] 20 bmp Vt Set:  [420 mL] 420 mL PEEP:  [8 cmH20] 8 cmH20  ABG    Component Value Date/Time   PHART 7.452 (H) 08/18/2019 1241   PCO2ART 53.4 (H) 08/18/2019 1241   PO2ART 85 08/18/2019 1241   HCO3 37.0 (H) 08/18/2019 1241   TCO2 39 (H) 08/18/2019 1241   ACIDBASEDEF 1.0 08/08/2019 1115   O2SAT 56.6 08/19/2019 0345    - Abd: soft - Extremity: warm  .Intake/Output      07/10 0701 - 07/11 0700 07/11 0701 - 07/12 0700   I.V. (mL/kg) 2612.7 (32.8)    Other     NG/GT 1560    IV Piggyback 330.1    Total Intake(mL/kg) 4502.8 (56.5)    Urine (mL/kg/hr) 4430 (2.3)    Emesis/NG output 300    Drains     Stool 450    Blood     Chest Tube 1100    Total Output 6280    Net -1777.2            _______________________________________________________________ Labs: CBC Latest Ref Rng & Units 08/19/2019 08/18/2019 08/18/2019  WBC 4.0 - 10.5 K/uL 14.4(H) - 19.0(H)  Hemoglobin 13.0 - 17.0 g/dL 10.2(L) 11.2(L) 10.4(L)  Hematocrit 39 - 52 % 29.7(L) 33.0(L) 30.9(L)  Platelets 150 - 400 K/uL 236 - 217   CMP Latest Ref Rng & Units 08/19/2019 08/18/2019 08/18/2019  Glucose 70 - 99 mg/dL 10/19/2019) 945(W) -  BUN 8 - 23  mg/dL 388(E) 28(M) -  Creatinine 0.61 - 1.24 mg/dL 03(K) 9.17(H) -  Sodium 135 - 145 mmol/L 146(H) 142 145  Potassium 3.5 - 5.1 mmol/L 3.3(L) 3.1(L) 3.4(L)  Chloride 98 - 111 mmol/L 100 100 -  CO2 22 - 32 mmol/L 35(H) 32 -  Calcium 8.9 - 10.3 mg/dL 7.9(L) 7.5(L) -  Total Protein 6.5 - 8.1 g/dL 6.0(L) - -  Total Bilirubin 0.3 - 1.2 mg/dL 12.1(H) - -  Alkaline Phos 38 - 126 U/L 590(H) - -  AST 15 - 41 U/L 650(H) - -  ALT 0 - 44 U/L 288(H) - -    CXR: stable  _______________________________________________________________  Assessment and Plan: s/p CABG, Vfib arrest, s/p ECMO, and impella placement.  Impella removed on 7/9.ECMO decannulation on 6/28  Neuro: concern for stroke   CT head ordered.   CV: on multiple drips.  Will wean levophed as tolerated.  Will keep milr and vaso.  Once levo is lower  will continue weaning epi.  Continues to have intermittent runs of VTach  Pulm: continue vent support Renal: good uop, on lasix gtt.  Creat stable GI: on goal tube feeds.  LFTs up.  Would consider decreasing amio. Heme: plts 236.  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   ID: afebrile WBC 14.  On merrem, vanc, and eraxis Endo: on insulin Dispo: continue ICU care  Brynda Greathouse, MD 08/19/2019 9:35 AM

## 2019-08-19 NOTE — Progress Notes (Signed)
Patient ID: Todd [name], male   DOB: 1955-12-17, 64 y.o.   MRN: [id number]     Advanced Heart Failure Rounding Note  PCP-Cardiologist: No primary care provider on file.   Subjective:    08/02/19 CABG 08/03/19 VF arrest. 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  MAP stable.  Co-ox 57%.   Still with runs of NSVT, K down 3.3 this morning.  Remains on amiodarone 60 mg/hr.  NSR today.  Lasix gtt currently at 10 mg/hr, I/Os negative with good UOP (net negative), CVP 7.    Tm 100.9, WBCs 26=>21=>23=>19=>14.  He is on vancomycin, meropenem, anidulafungin. Few Candida in trach aspirate. Venous dopplers negative for DVT.   Ileus, improving.  Now getting tube feeds via post-pyloric tube.  Off TPN.   Creatinine 1.46 => 1.53.    Patient not following commands with sedation wean, per nurse seems to be neglecting right side.   Jaundiced with consistently elevated bilirubin (direct).   Objective:   Weight Range: 79.7 kg Body mass index is 25.95 kg/m.   Vital Signs:   Temp:  [97.7 F (36.5 C)-100.9 F (38.3 C)] 98.4 F (36.9 C) (07/11 0630) Pulse Rate:  [78-107] 89 (07/11 0746) Resp:  [17-37] 26 (07/11 0746) BP: (84-118)/(49-89) 87/60 (07/11 0746) SpO2:  [92 %-100 %] 99 % (07/11 0746) Arterial Line BP: (81-118)/(45-87) 82/45 (07/11 0700) FiO2 (%):  [40 %-50 %] 50 % (07/11 0746) Weight:  [79.7 kg] 79.7 kg (07/11 0735) Last BM Date: 08/18/19  Weight change: Filed Weights   08/18/19 0331 08/19/19 0328 08/19/19 0735  Weight: 80.8 kg 79.7 kg 79.7 kg    Intake/Output:   Intake/Output Summary (Last 24 hours) at 08/19/2019  0814 Last data filed at 08/19/2019 0700 Gross per 24 hour  Intake 4306.17 ml  Output 5930 ml  Net -1623.83 ml      Physical Exam    General: Intubated Neck: No JVD, no thyromegaly or thyroid nodule.  Lungs: Decreased at bases.  CV: Nondisplaced PMI.  Heart regular S1/S2, no S3/S4, no murmur.  2+ edema to knees.   Abdomen: Soft, nontender, no hepatosplenomegaly, no distention.  Skin: Jaundiced Neurologic: Does not respond to commands, right-sided neglect  Extremities: No clubbing or cyanosis.  HEENT: Normal.    Telemetry   NSR 80s Personally reviewed   Labs    CBC Recent Labs    08/18/19 0405 08/18/19 0405 08/18/19 1241 08/19/19 0345  WBC 19.0*  --   --  14.4*  HGB 10.4*   < > 11.2* 10.2*  HCT 30.9*   < > 33.0* 29.7*  MCV 85.4  --   --  85.1  PLT 217  --   --  236   < > = values in this interval not displayed.   Basic Metabolic Panel Recent Labs    08/17/19 0233 08/17/19 0931 08/17/19 1600 08/18/19 0001 08/18/19 1600 08/19/19 0345  NA 140   < > 140   < > 142 146*  K 3.2*   < > 3.4*   < > 3.1* 3.3*  CL 98   < > 99   < > 100 100  CO2 32   < > 32   < >  32 35*  GLUCOSE 323*   < > 102*   < > 214* 189*  BUN 46*   < > 48*   < > 57* 61*  CREATININE 1.41*   < > 1.41*   < > 1.41* 1.53*  CALCIUM 7.3*   < > 7.5*   < > 7.5* 7.9*  MG 2.0  --   --   --   --  2.2  PHOS  --   --  4.8*  --  4.6  --    < > = values in this interval not displayed.   Liver Function Tests Recent Labs    08/17/19 0233 08/17/19 1600 08/18/19 1600 08/19/19 0345  AST 308*  --   --  650*  ALT 135*  --   --  288*  ALKPHOS 183*  --   --  590*  BILITOT 9.3*  --   --  12.1*  PROT 5.2*  --   --  6.0*  ALBUMIN 1.4*   < > 1.4* 1.5*   < > = values in this interval not displayed.   No results for input(s): LIPASE, AMYLASE in the last 72 hours. Cardiac Enzymes No results for input(s): CKTOTAL, CKMB, CKMBINDEX, TROPONINI in the last 72 hours.  BNP: BNP (last 3 results) Recent Labs     07/26/19 1236  BNP 2,568.2*    ProBNP (last 3 results) No results for input(s): PROBNP in the last 8760 hours.   D-Dimer No results for input(s): DDIMER in the last 72 hours. Hemoglobin A1C No results for input(s): HGBA1C in the last 72 hours. Fasting Lipid Panel Recent Labs    08/19/19 0345  TRIG 214*   Thyroid Function Tests No results for input(s): TSH, T4TOTAL, T3FREE, THYROIDAB in the last 72 hours.  Invalid input(s): FREET3  Other results:   Imaging    VAS [place] LOWER EXTREMITY VENOUS (DVT)  Result Date: 08/18/2019  Lower Venous DVTStudy Indications: Edema.  Limitations: Bandages, line and ventilation. Comparison Study: No prior study on file Performing Technologist: Sharion Dove RVS  Examination Guidelines: A complete evaluation includes B-mode imaging, spectral Doppler, color Doppler, and power Doppler as needed of all accessible portions of each vessel. Bilateral testing is considered an integral part of a complete examination. Limited examinations for reoccurring indications may be performed as noted. The reflux portion of the exam is performed with the patient in reverse Trendelenburg.  +---------+---------------+---------+-----------+----------+-------------------+  RIGHT     Compressibility Phasicity Spontaneity Properties Thrombus Aging       +---------+---------------+---------+-----------+----------+-------------------+  CFV                                                        Not visualized       +---------+---------------+---------+-----------+----------+-------------------+  SFJ                                                        Not visualized       +---------+---------------+---------+-----------+----------+-------------------+  FV Prox   Full            Yes       Yes                                         +---------+---------------+---------+-----------+----------+-------------------+  FV Mid    Full                                                                   +---------+---------------+---------+-----------+----------+-------------------+  FV Distal Full                                                                  +---------+---------------+---------+-----------+----------+-------------------+  POP                       Yes       Yes                    patent by color and                                                              Doppler              +---------+---------------+---------+-----------+----------+-------------------+  PTV       Full                                                                  +---------+---------------+---------+-----------+----------+-------------------+  PERO      Full                                                                  +---------+---------------+---------+-----------+----------+-------------------+   +---------+---------------+---------+-----------+----------+-------------------+  LEFT      Compressibility Phasicity Spontaneity Properties Thrombus Aging       +---------+---------------+---------+-----------+----------+-------------------+  CFV                                                        Not visualized       +---------+---------------+---------+-----------+----------+-------------------+  SFJ                                                        Not visualized       +---------+---------------+---------+-----------+----------+-------------------+  FV Prox  patent by color and                                                              Doppler              +---------+---------------+---------+-----------+----------+-------------------+  FV Mid                                                     Not visualized       +---------+---------------+---------+-----------+----------+-------------------+  FV Distal                                                  patent by color and                                                              Doppler               +---------+---------------+---------+-----------+----------+-------------------+  PFV                                                        Not visualized       +---------+---------------+---------+-----------+----------+-------------------+  POP                       Yes       Yes                    patent by color and                                                              Doppler              +---------+---------------+---------+-----------+----------+-------------------+  PTV       Full                                                                  +---------+---------------+---------+-----------+----------+-------------------+  PERO      Full                                                                  +---------+---------------+---------+-----------+----------+-------------------+  Summary: RIGHT: - There is no evidence of deep vein thrombosis in the lower extremity. However, portions of this examination were limited- see technologist comments above.  interstitial edema noted throughout  LEFT: - There is no evidence of deep vein thrombosis in the lower extremity. However, portions of this examination were limited- see technologist comments above.  Interstitial edema noted throughout.  *See table(s) above for measurements and observations. Electronically signed by Vance Brabham MD on 08/18/2019 at 6:02:04 PM.    Final   ° ° ° °Medications:   ° ° °Scheduled Medications: °• artificial tears   Both Eyes Q8H  °• aspirin  81 mg Per Tube Daily  °• chlorhexidine gluconate (MEDLINE KIT)  15 mL Mouth Rinse BID  °• Chlorhexidine Gluconate Cloth  6 each Topical Daily  °• collagenase   Topical Daily  °• docusate  200 mg Per Tube Daily  °• feeding supplement (PROSource TF)  45 mL Per Tube BID  °• fentaNYL (SUBLIMAZE) injection  50 mcg Intravenous Once  °• insulin aspart  0-24 Units Subcutaneous Q4H  °• insulin aspart  5 Units Subcutaneous Q4H  °• insulin detemir  12 Units Subcutaneous BID  °• ipratropium  0.5  mg Nebulization Q6H  °• levalbuterol  1.25 mg Nebulization Q6H  °• mouth rinse  15 mL Mouth Rinse 10 times per day  °• pantoprazole (PROTONIX) IV  40 mg Intravenous QHS  °• potassium chloride  20 mEq Per Tube Q4H  °• potassium chloride  40 mEq Per Tube BID  °• sodium chloride flush  10-40 mL Intracatheter Q12H  °• sodium chloride flush  10-40 mL Intracatheter Q12H  °• sodium chloride flush  3 mL Intravenous Q12H  ° ° °Infusions: °• sodium chloride 250 mL (08/13/19 1412)  °• sodium chloride Stopped (08/13/19 1631)  °• amiodarone 60 mg/hr (08/19/19 0700)  °• anidulafungin Stopped (08/18/19 0952)  °• bivalirudin (ANGIOMAX) infusion 0.5 mg/mL (Non-ACS indications) 0.07 mg/kg/hr (08/19/19 0700)  °• dextrose 5 % Impella 5.0 Purge solution    °• epinephrine 3 mcg/min (08/19/19 0700)  °• feeding supplement (PIVOT 1.5 CAL) 1,000 mL (08/19/19 0511)  °• fentaNYL infusion INTRAVENOUS 150 mcg/hr (08/19/19 0700)  °• furosemide (LASIX) infusion 10 mg/hr (08/19/19 0700)  °• lactated ringers 20 mL/hr at 08/10/19 1705  °• lactated ringers 10 mL/hr at 08/18/19 0054  °• meropenem (MERREM) IV 1 g (08/19/19 0525)  °• midazolam Stopped (08/15/19 1759)  °• milrinone 0.125 mcg/kg/min (08/19/19 0700)  °• norepinephrine (LEVOPHED) Adult infusion 12 mcg/min (08/19/19 0700)  °• propofol (DIPRIVAN) infusion Stopped (08/13/19 1420)  °• vancomycin Stopped (08/18/19 1514)  °• vasopressin 0.04 Units/min (08/19/19 0700)  ° ° °PRN Medications: °sodium chloride, sodium chloride, dextrose, fentaNYL, metoprolol tartrate, midazolam, morphine injection, ondansetron (ZOFRAN) IV, sodium chloride flush, sodium chloride flush, sodium chloride flush ° ° °Assessment/Plan  ° °1. Shock: At this point, think mixed cardiogenic/septic.  Echo with EF 20-25%, moderate RV dysfunction.  VA ECMO post-VF arrest, cannulated 6/25.  Stable s/p return to OR for mediastinal re-exploration due to bleeding on 6/25.  Decannulated from VA ECMO on 08/06/19. Impella 5.5 placed.  Underwent emergent chest re-exploration on 6/29 for tamponade and clot removal. Chest closed 7/2.  Impella 5.5 removed 7/9.  This morning, he is on epinephrine 3, NE 12, milrinone 0.125, vasopressin 0.04.  Suspect component of septic/vasodilatory shock with fever, elevated WBCs.  Co-ox 57%, CVP 7. Good UOP with Lasix gtt 10 mg/hr. °- Continue current pressors/inotropes, wean as able today. Slowly coming down   on norepinephrine.  Continue to wean sedation as much as possible today to see if this will help with pressor wean.  - With hypokalemia and CVP down, will decrease Lasix gtt to 8 mg/hr, aim to keep I/Os even for now.  2. 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  Prolonged VF arrest on 6/25. Chest closed 7/2.  - ASA  - Hold Crestor with elevated LFTs.  3. Cardiac arrest: VF arrest with prolonged code. Still with episodes of VT/NSVT in setting of low K (difficult to effectively replace).  - Aggressive K replacement again today, recheck in pm.   - Continue IV amiodarone at 60 mg/hr.   - Eventual evaluation for ICD or lifevest at d/c - Keep K > 4.0 Mg > 2.0 4. Acute hypoxemic respiratory failure: In setting of cardiac arrest.  H/o COPD.  Bibasilar infiltrates, possible HCAP.   - On vent, CCM following, suspect will need trach.   - Getting vancomycin/meropenem/[name] for possible PNA.   5. Acute blood loss anemia: Now s/p mediastinal re-exploration x 3. Hgb stable today.  CT drainage has slowed.   - Now on bivalirudin gtt. 6. 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  Platelets have trended back up.  7. ID: Suspect HCAP, on meropenem/vancomycin/anidulafungin.  WBCs 26 => 22 => 26 => 20 => 23 => 19 => 14. Tm 100.9, persistently febrile for several days. Venous dopplers negative for DVT. PCT 1.99 => 2.35.  Suspect component of septic shock. Cultures negative so far except few Candida in tracheal aspirate. 8. Elevated bilirubin: Mostly direct = likely  cholestatic/shock liver/RV failure predominantly.   Abdominal US with evidence for cirrhosis, sludge in GB but no definite acute cholecystitis. - Continue to follow LFTs.    9. Ileus: Improving.  Now getting tube feeds and off TPN. 10. Atrial fibrillation/flutter: Paroxysmal. Back in NSR today.     - Continue amiodarone gtt.  - bivalirudin gtt.  11. Neuro: Does not respond to commands, possible right-sided neglect.  - CT head assess for CVA/ICH.   CRITICAL CARE Performed by: Loralie Champagne  Total critical care time: 40 minutes at bedside  Critical care time was exclusive of separately billable procedures and treating other patients.  Critical care was necessary to treat or prevent imminent or life-threatening deterioration.  Critical care was time spent personally by me on the following activities: development of treatment plan with patient and/or surrogate as well as nursing, discussions with consultants, evaluation of patient's response to treatment, examination of patient, obtaining history from patient or surrogate, ordering and performing treatments and interventions, ordering and review of laboratory studies, ordering and review of radiographic studies, pulse oximetry and re-evaluation of patient's condition.   Length of Stay: [place], MD  08/19/2019, 8:14 AM  Advanced Heart Failure Team Pager [phone] (M-F; 7a - 4p)  Please contact [facility] for night-coverage after hours (4p -7a ) and weekends on [email or web address]

## 2019-08-19 NOTE — Progress Notes (Signed)
ANTICOAGULATION CONSULT NOTE - Follow Up Consult  Pharmacy Consult for heparin Indication: ECMO > impella  Labs: Recent Labs    08/17/19 0500 08/17/19 0752 08/17/19 0931 08/18/19 0405 08/18/19 0405 08/18/19 0650 08/18/19 1018 08/18/19 1241 08/18/19 1600 08/19/19 0345  HGB  --  11.0*   < > 10.4*   < >  --   --  11.2*  --  10.2*  HCT  --  31.9*   < > 30.9*  --   --   --  33.0*  --  29.7*  PLT  --  156  --  217  --   --   --   --   --  236  APTT   < >  --   --   --   --  63* 71*  --   --  76*  CREATININE  --   --    < > 1.46*  --   --   --   --  1.41* 1.53*   < > = values in this interval not displayed.    Assessment: 64 yo male s/p ECMO, now on Impella. Chest closed 7/2. CT drainage decreased.    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 Has been on bivalirudin@0 .07 mg/kg/hr - shut off yesterday for impella removal. Discussed with CTVS - okay to restart 12 hours after removal.   Aptt slightly above the established lower goal range (76).  No overt bleeding or complications noted.  Awaiting confirmatory SRA for HIT - in process (send-out lab)  Goals of Therapy: APTT 50 -70 s  Plan: Decrease bivalirudin to 0.06 mg/kg/hr Recheck aPTT in 2 hrs. Daily aPTT. Monitor daily aPTT, CBC, and for s/sx of bleeding  Reece Leader, Colon Flattery, [facility] Clinical Pharmacist  08/19/2019 9:01 AM   [facility] pharmacy phone numbers are listed on [email or web address]

## 2019-08-20 ENCOUNTER — Inpatient Hospital Stay (HOSPITAL_COMMUNITY): Payer: Medicare Other

## 2019-08-20 DIAGNOSIS — J9620 Acute and chronic respiratory failure, unspecified whether with hypoxia or hypercapnia: Secondary | ICD-10-CM

## 2019-08-20 DIAGNOSIS — J9600 Acute respiratory failure, unspecified whether with hypoxia or hypercapnia: Secondary | ICD-10-CM

## 2019-08-20 LAB — PHOSPHORUS
Phosphorus: 3.6 mg/dL (ref 2.5–4.6)
Phosphorus: 4.3 mg/dL (ref 2.5–4.6)

## 2019-08-20 LAB — BODY FLUID CELL COUNT WITH DIFFERENTIAL
Eos, Fluid: 0 %
Lymphs, Fluid: 3 %
Monocyte-Macrophage-Serous Fluid: 19 % — ABNORMAL LOW (ref 50–90)
Neutrophil Count, Fluid: 78 % — ABNORMAL HIGH (ref 0–25)
Total Nucleated Cell Count, Fluid: 615 cu mm (ref 0–1000)

## 2019-08-20 LAB — LACTATE DEHYDROGENASE: LDH: 439 U/L — ABNORMAL HIGH (ref 98–192)

## 2019-08-20 LAB — COMPREHENSIVE METABOLIC PANEL
ALT: 357 U/L — ABNORMAL HIGH (ref 0–44)
AST: 688 U/L — ABNORMAL HIGH (ref 15–41)
Albumin: 1.4 g/dL — ABNORMAL LOW (ref 3.5–5.0)
Alkaline Phosphatase: 665 U/L — ABNORMAL HIGH (ref 38–126)
Anion gap: 12 (ref 5–15)
BUN: 63 mg/dL — ABNORMAL HIGH (ref 8–23)
CO2: 34 mmol/L — ABNORMAL HIGH (ref 22–32)
Calcium: 7.9 mg/dL — ABNORMAL LOW (ref 8.9–10.3)
Chloride: 104 mmol/L (ref 98–111)
Creatinine, Ser: 1.35 mg/dL — ABNORMAL HIGH (ref 0.61–1.24)
GFR calc Af Amer: >60 mL/min (ref >60)
GFR calc non Af Amer: 55 mL/min — ABNORMAL LOW (ref >60)
Glucose, Bld: 99 mg/dL (ref 70–99)
Potassium: 3.5 mmol/L (ref 3.5–5.1)
Sodium: 150 mmol/L — ABNORMAL HIGH (ref 135–145)
Total Bilirubin: 12.8 mg/dL — ABNORMAL HIGH (ref 0.3–1.2)
Total Protein: 5.7 g/dL — ABNORMAL LOW (ref 6.5–8.1)

## 2019-08-20 LAB — DIFFERENTIAL
Abs Immature Granulocytes: 0.21 10*3/uL — ABNORMAL HIGH (ref 0.00–0.07)
Basophils Absolute: 0.1 10*3/uL (ref 0.0–0.1)
Basophils Relative: 0 %
Eosinophils Absolute: 0 10*3/uL (ref 0.0–0.5)
Eosinophils Relative: 0 %
Immature Granulocytes: 1 %
Lymphocytes Relative: 6 %
Lymphs Abs: 0.8 10*3/uL (ref 0.7–4.0)
Monocytes Absolute: 0.9 10*3/uL (ref 0.1–1.0)
Monocytes Relative: 6 %
Neutro Abs: 12.8 10*3/uL — ABNORMAL HIGH (ref 1.7–7.7)
Neutrophils Relative %: 87 %

## 2019-08-20 LAB — GLUCOSE, CAPILLARY
Glucose-Capillary: 150 mg/dL — ABNORMAL HIGH (ref 70–99)
Glucose-Capillary: 83 mg/dL (ref 70–99)
Glucose-Capillary: 86 mg/dL (ref 70–99)
Glucose-Capillary: 88 mg/dL (ref 70–99)
Glucose-Capillary: 105 mg/dL — ABNORMAL HIGH (ref 70–99)
Glucose-Capillary: 164 mg/dL — ABNORMAL HIGH (ref 70–99)
Glucose-Capillary: 154 mg/dL — ABNORMAL HIGH (ref 70–99)

## 2019-08-20 LAB — RENAL FUNCTION PANEL
Albumin: 1.9 g/dL — ABNORMAL LOW (ref 3.5–5.0)
Anion gap: 14 (ref 5–15)
BUN: 62 mg/dL — ABNORMAL HIGH (ref 8–23)
CO2: 37 mmol/L — ABNORMAL HIGH (ref 22–32)
Calcium: 8.2 mg/dL — ABNORMAL LOW (ref 8.9–10.3)
Chloride: 99 mmol/L (ref 98–111)
Creatinine, Ser: 1.5 mg/dL — ABNORMAL HIGH (ref 0.61–1.24)
GFR calc Af Amer: 56 mL/min — ABNORMAL LOW (ref >60)
GFR calc non Af Amer: 48 mL/min — ABNORMAL LOW (ref >60)
Glucose, Bld: 110 mg/dL — ABNORMAL HIGH (ref 70–99)
Phosphorus: 5.6 mg/dL — ABNORMAL HIGH (ref 2.5–4.6)
Potassium: 3.6 mmol/L (ref 3.5–5.1)
Sodium: 150 mmol/L — ABNORMAL HIGH (ref 135–145)

## 2019-08-20 LAB — CULTURE, BLOOD (ROUTINE X 2)
Culture: NO GROWTH
Culture: NO GROWTH
Special Requests: ADEQUATE
Special Requests: ADEQUATE

## 2019-08-20 LAB — BASIC METABOLIC PANEL
Anion gap: 13 (ref 5–15)
BUN: 58 mg/dL — ABNORMAL HIGH (ref 8–23)
CO2: 35 mmol/L — ABNORMAL HIGH (ref 22–32)
Calcium: 8 mg/dL — ABNORMAL LOW (ref 8.9–10.3)
Chloride: 102 mmol/L (ref 98–111)
Creatinine, Ser: 1.45 mg/dL — ABNORMAL HIGH (ref 0.61–1.24)
GFR calc Af Amer: 58 mL/min — ABNORMAL LOW (ref >60)
GFR calc non Af Amer: 50 mL/min — ABNORMAL LOW (ref >60)
Glucose, Bld: 112 mg/dL — ABNORMAL HIGH (ref 70–99)
Potassium: 4 mmol/L (ref 3.5–5.1)
Sodium: 150 mmol/L — ABNORMAL HIGH (ref 135–145)

## 2019-08-20 LAB — MAGNESIUM
Magnesium: 2 mg/dL (ref 1.7–2.4)
Magnesium: 2 mg/dL (ref 1.7–2.4)

## 2019-08-20 LAB — CBC
HCT: 29.4 % — ABNORMAL LOW (ref 39.0–52.0)
Hemoglobin: 10 g/dL — ABNORMAL LOW (ref 13.0–17.0)
MCH: 29.3 pg (ref 26.0–34.0)
MCHC: 34 g/dL (ref 30.0–36.0)
MCV: 86.2 fL (ref 80.0–100.0)
Platelets: 283 10*3/uL (ref 150–400)
RBC: 3.41 MIL/uL — ABNORMAL LOW (ref 4.22–5.81)
RDW: 21.2 % — ABNORMAL HIGH (ref 11.5–15.5)
WBC: 14.9 10*3/uL — ABNORMAL HIGH (ref 4.0–10.5)
nRBC: 0 % (ref 0.0–0.2)

## 2019-08-20 LAB — COOXEMETRY PANEL
Carboxyhemoglobin: 1.4 % (ref 0.5–1.5)
Methemoglobin: 1.2 % (ref 0.0–1.5)
O2 Saturation: 52 %
Total hemoglobin: 10.2 g/dL — ABNORMAL LOW (ref 12.0–16.0)

## 2019-08-20 LAB — PREALBUMIN: Prealbumin: 12.5 mg/dL — ABNORMAL LOW (ref 18–38)

## 2019-08-20 LAB — PROTIME-INR
INR: 1.6 — ABNORMAL HIGH (ref 0.8–1.2)
Prothrombin Time: 18.7 seconds — ABNORMAL HIGH (ref 11.4–15.2)

## 2019-08-20 LAB — APTT: aPTT: 68 seconds — ABNORMAL HIGH (ref 24–36)

## 2019-08-20 LAB — PROCALCITONIN: Procalcitonin: 2.01 ng/mL

## 2019-08-20 LAB — TRIGLYCERIDES: Triglycerides: 213 mg/dL — ABNORMAL HIGH (ref <150)

## 2019-08-20 MED ORDER — INSULIN DETEMIR 100 UNIT/ML ~~LOC~~ SOLN
8.0000 [IU] | Freq: Two times a day (BID) | SUBCUTANEOUS | Status: DC
  Administered 2019-08-20 – 2019-08-22 (×6): 8 [IU] via SUBCUTANEOUS
  Filled 2019-08-20 (×8): qty 0.08

## 2019-08-20 MED ORDER — ETOMIDATE 2 MG/ML IV SOLN
20.0000 mg | Freq: Once | INTRAVENOUS | Status: AC
  Administered 2019-08-20: 20 mg via INTRAVENOUS
  Filled 2019-08-20: qty 10

## 2019-08-20 MED ORDER — MIDAZOLAM HCL 2 MG/2ML IJ SOLN
5.0000 mg | Freq: Once | INTRAMUSCULAR | Status: AC
  Administered 2019-08-20: 4 mg via INTRAVENOUS
  Filled 2019-08-20: qty 6

## 2019-08-20 MED ORDER — FREE WATER: 200.0000 mL | Freq: Four times a day (QID) | Status: DC

## 2019-08-20 MED ORDER — SODIUM CHLORIDE 0.9 % IV SOLN
0.0550 mg/kg/h | INTRAVENOUS | Status: DC
  Administered 2019-08-20: 0.055 mg/kg/h via INTRAVENOUS
  Filled 2019-08-20 (×2): qty 250

## 2019-08-20 MED ORDER — METOLAZONE 5 MG PO TABS
5.0000 mg | ORAL_TABLET | Freq: Once | ORAL | Status: AC
  Administered 2019-08-20: 5 mg via ORAL
  Filled 2019-08-20: qty 1

## 2019-08-20 MED ORDER — POTASSIUM CHLORIDE 10 MEQ/50ML IV SOLN
10.0000 meq | INTRAVENOUS | Status: AC
  Administered 2019-08-20 (×3): 10 meq via INTRAVENOUS
  Filled 2019-08-20 (×3): qty 50

## 2019-08-20 MED ORDER — ALBUMIN HUMAN 25 % IV SOLN
12.5000 g | Freq: Four times a day (QID) | INTRAVENOUS | Status: AC
  Administered 2019-08-20 – 2019-08-22 (×8): 12.5 g via INTRAVENOUS
  Filled 2019-08-20 (×8): qty 50

## 2019-08-20 MED ORDER — VECURONIUM BROMIDE 10 MG IV SOLR
10.0000 mg | Freq: Once | INTRAVENOUS | Status: AC
  Administered 2019-08-20: 10 mg via INTRAVENOUS
  Filled 2019-08-20: qty 10

## 2019-08-20 MED ORDER — STERILE WATER FOR INJECTION IJ SOLN
INTRAMUSCULAR | Status: AC
  Administered 2019-08-20: 10 mL
  Filled 2019-08-20: qty 10

## 2019-08-20 MED ORDER — VANCOMYCIN HCL 750 MG/150ML IV SOLN
750.0000 mg | INTRAVENOUS | Status: DC
  Administered 2019-08-20: 750 mg via INTRAVENOUS
  Filled 2019-08-20 (×2): qty 150

## 2019-08-20 MED ORDER — POTASSIUM CHLORIDE 20 MEQ/15ML (10%) PO SOLN
20.0000 meq | ORAL | Status: AC
  Administered 2019-08-20 (×3): 20 meq
  Filled 2019-08-20 (×2): qty 15

## 2019-08-20 MED ORDER — FREE WATER
300.0000 mL | Freq: Four times a day (QID) | Status: DC
  Administered 2019-08-20 – 2019-08-23 (×10): 300 mL

## 2019-08-20 MED ORDER — FENTANYL CITRATE (PF) 100 MCG/2ML IJ SOLN
200.0000 ug | Freq: Once | INTRAMUSCULAR | Status: AC
  Administered 2019-08-20: 100 ug via INTRAVENOUS

## 2019-08-20 NOTE — Progress Notes (Addendum)
ANTICOAGULATION CONSULT NOTE - Follow Up Consult  Pharmacy Consult for heparin Indication: ECMO > impella  Labs: Recent Labs    08/18/19 0405 08/18/19 0650 08/18/19 1241 08/18/19 1600 08/19/19 0345 08/19/19 1222 08/19/19 1445 08/20/19 0423  HGB 10.4*   < > 11.2*   < > 10.2*  --   --  10.0*  HCT 30.9*   < > 33.0*  --  29.7*  --   --  29.4*  PLT 217  --   --   --  236  --   --  283  APTT  --    < >  --   --  76* 74*  --  68*  CREATININE 1.46*  --   --    < > 1.53*  --  1.49* 1.35*   < > = values in this interval not displayed.    Assessment: 64 yo male s/p ECMO, now on Impella. Chest closed 7/2. CT drainage decreased.    HIT ab sent a few days ago and returned as 2.6 - positive, switched from heparin to bivalirudin.  APTT this morning is therapeutic at 68, on bivalirudin@0 .055 mg/kg/hr. Hgb 10, plt 283. LDH 439. No s/sx of bleeding or infusion issues.   Awaiting confirmatory SRA for HIT - in process (send-out lab)  Goals of Therapy: APTT 50 -70 s  Plan: Continue bivalirudin at 0.055 mg/kg/hr Daily aPTT. Monitor daily aPTT, CBC, and for s/sx of bleeding  Sherron Monday, PharmD, BCCCP Clinical Pharmacist  Phone: [phone] 08/20/2019 8:38 AM  Please check AMION for all [facility] Pharmacy phone numbers After 10:00 PM, call Main Pharmacy [phone]   ADDENDUM Bivalirudin was stopped for trach procedure- had been previously therapeutic. Discussed with MD and okay to resume back at previous rate. Monitor daily aPTT and CBC.   Sherron Monday, PharmD, BCCCP Clinical Pharmacist

## 2019-08-20 NOTE — Op Note (Signed)
Todd [name]  Date of admit: 07/26/2019  LOS: 25 days  Date of Procedure: 08/20/2019            PROCEDURE  NOTE   FLEXIBLE VIDEO BRONCHOSCOPY   FOR PERCUTANEOUS DILATATIONAL TRACHEOSTOMY  Followed by BAL - LEFT LOWER LOBE at Dr Thompson Caul request    OPERATOR  1. Dr. Brand Males - Flexible Bronchoscopy  2. Dr Ina Homes - Percutaneous Dilatational Tracheostomy    RISKS  Risks of pneumothorax, hemothorax, sedation/anesthesia complications such as cardiac or respiratory arrest or hypotension, stroke and bleeding all explained. Benefits of diagnosis but limitations of non-diagnosis also explained. Patient/family verbalized understanding and wished to proceed.   CONSENT  Signed informed consent from - done by Dr Erskine Emery - who got it from wife     PROCEDURE DETAILS   Procedure done by Dr Chase Caller on 08/20/2019  After ensuring adequate anesthesia, at first bronch was introduce through ET tube and structures of tracheal rings, carina identified for operator of tracheostomy listed above.  Light of bronch passed through trachea and skin for indentification of tracheal rings for tracheostomy puncture. After this, under bronchoscopy guidance, ET tube was pulled back sufficiently and very carefully. The ET tube was pulled back enough to give room for tracheostomy operator and yet at same time to to ensure a secured airway. After this was accomplished, bronchoscope was withdrawn into the ET tube. After this, The operator of tracheostomy listed above, performed tracheostomy under video visual provided by flexible video bronchoscopy. Followng introduction of tracheostomy, the bronchoscope was removed from ET tube and introduced through tracheostomy. Correct position of tracheostomy was ensured, with enough room between carina and distal tracheostomy and no evidence of bleeding. The bronchoscope was then withdrawn. Respiratory therapist was then instructed to remove the ET tube. Tracheostomy  operator listed above then proceeded to complete the tracheostomy with stay sutures   Following this at request of Dr Tamala Julian for left lower lobe lavage - the bronchoscope was reintroduced through the trach and guided to the left lower lobe. Any mucus along the way was suctioned out. Bronch was wedged in the left lower lobe posterior segmet and 30cc x 4 of normal saline was introudced and aspirated into Leukens trap in 4 serial aliquots. The returned material was purulent grey  COMPLICATIONS  No complications    Dr. Brand Males, M.D., [facility].C.P  Pulmonary and Critical Care Medicine  Staff Physician  [facility]  Pager: [phone], If no answer or between 15:00h - 7:00h: call [phone]  08/20/2019 and 1:45 PM

## 2019-08-20 NOTE — Progress Notes (Signed)
SLP Cancellation Note  Patient Details Name: Graysen Woodyard MRN: [id number] DOB: 1955/12/12   Cancelled treatment:       Reason Eval/Treat Not Completed: Other (comment) Patient with new tracheostomy. Orders for SLP eval and treat for PMSV and swallowing received. Will follow pt closely for readiness for SLP interventions as appropriate.     Mahala Menghini., M.A. CCC-SLP Acute Rehabilitation Services Pager [phone] Office [phone]  08/20/2019, 1:56 PM

## 2019-08-20 NOTE — Progress Notes (Signed)
Patient ID: Todd [name], male   DOB: 18-Mar-1955, 64 y.o.   MRN: [id number]     Advanced Heart Failure Rounding Note  PCP-Cardiologist: No primary care provider on file.   Subjective:    08/02/19 CABG 08/03/19 VF arrest. 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  MAP stable.  Co-ox 52%.   In NSR on amiodarone at 60, no further NSVT.   Lasix gtt currently at 8 mg/hr, I/Os negative with good UOP (net negative), CVP 9.    Afebrile, WBCs 26=>21=>23=>19=>14=>15.  He is on vancomycin, meropenem, anidulafungin. Few Candida in trach aspirate. Venous dopplers negative for DVT.   Ileus, improving.  Now getting tube feeds via post-pyloric tube.  Off TPN.   Creatinine 1.46 => 1.53 => 1.35.  Na 150 this morning.     Patient not following commands with sedation wean, CT head negative 7/11.   Jaundiced with consistently elevated bilirubin (direct).   Objective:   Weight Range: 78 kg Body mass index is 25.39 kg/m.   Vital Signs:   Temp:  [95.9 F (35.5 C)-98.2 F (36.8 C)] 98.1 F (36.7 C) (07/12 0700) Pulse Rate:  [76-101] 96 (07/12 0700) Resp:  [14-28] 25 (07/12 0700) BP: (94-126)/(50-86) 116/62 (07/12 0723) SpO2:  [91 %-100 %] 93 % (07/12 0723) Arterial Line BP: (93-115)/(45-60) 100/49 (07/11 1900) FiO2 (%):  [40 %-50 %] 40 % (07/12 0724) Weight:  [78 kg] 78 kg (07/12 0600) Last BM Date: 08/18/19  Weight change: Filed Weights   08/19/19 0328 08/19/19 0735 08/20/19 0600  Weight: 79.7 kg 79.7 kg 78 kg    Intake/Output:   Intake/Output Summary (Last 24 hours) at 08/20/2019 0841 Last data filed at 08/20/2019  0700 Gross per 24 hour  Intake 4232.37 ml  Output 5000 ml  Net -767.63 ml      Physical Exam    General: Intubated/sedated Neck: JVP 8 cm, no thyromegaly or thyroid nodule.  Lungs: Decreased at bases.  CV: Nondisplaced PMI.  Heart regular S1/S2, no S3/S4, no murmur.  1+ edema to knees.   Abdomen: Soft, nontender, no hepatosplenomegaly, no distention.  Skin: Intact without lesions or rashes.  Neurologic: Not following commands Extremities: No clubbing or cyanosis.  HEENT: Normal.    Telemetry   NSR 80s Personally reviewed   Labs    CBC Recent Labs    08/19/19 0345 08/20/19 0423  WBC 14.4* 14.9*  NEUTROABS  --  12.8*  HGB 10.2* 10.0*  HCT 29.7* 29.4*  MCV 85.1 86.2  PLT 236 409   Basic Metabolic Panel Recent Labs    08/18/19 1600 08/18/19 1600 08/19/19 0345 08/19/19 0345 08/19/19 1445 08/20/19 0423  NA 142   < > 146*   < > 146* 150*  K 3.1*   < > 3.3*   < > 3.2* 3.5  CL 100   < > 100   < > 100 104  CO2 32   < > 35*   < > 34* 34*  GLUCOSE 214*   < > 189*   < > 136* 99  BUN 57*   < > 61*   < > 62* 63*  CREATININE 1.41*   < > 1.53*   < >  1.49* 1.35*  CALCIUM 7.5*   < > 7.9*   < > 7.8* 7.9*  MG  --   --  2.2  --   --  2.0  PHOS 4.6  --   --   --   --  3.6   < > = values in this interval not displayed.   Liver Function Tests Recent Labs    08/19/19 0345 08/20/19 0423  AST 650* 688*  ALT 288* 357*  ALKPHOS 590* 665*  BILITOT 12.1* 12.8*  PROT 6.0* 5.7*  ALBUMIN 1.5* 1.4*   No results for input(s): LIPASE, AMYLASE in the last 72 hours. Cardiac Enzymes No results for input(s): CKTOTAL, CKMB, CKMBINDEX, TROPONINI in the last 72 hours.  BNP: BNP (last 3 results) Recent Labs    07/26/19 1236  BNP 2,568.2*    ProBNP (last 3 results) No results for input(s): PROBNP in the last 8760 hours.   D-Dimer No results for input(s): DDIMER in the last 72 hours. Hemoglobin A1C No results for input(s): HGBA1C in the last 72 hours. Fasting Lipid  Panel Recent Labs    08/20/19 0423  TRIG 213*   Thyroid Function Tests No results for input(s): TSH, T4TOTAL, T3FREE, THYROIDAB in the last 72 hours.  Invalid input(s): FREET3  Other results:   Imaging    CT HEAD WO CONTRAST  Result Date: 08/19/2019 CLINICAL DATA:  Subacute neurological deficits. Specific symptoms not described. Recent LVAD removal. EXAM: CT HEAD WITHOUT CONTRAST TECHNIQUE: Contiguous axial images were obtained from the base of the skull through the vertex without intravenous contrast. COMPARISON:  04/30/2010 FINDINGS: Brain: Generalized age related volume loss. Chronic small-vessel ischemic changes affect the pons. Age indeterminate 1 cm infarction in the inferior cerebellum on the right, favored to be old. Chronic appearing small vessel ischemic changes of the cerebral hemispheric white matter. Old appearing infarction of the left basal ganglia. No suggestion of acute infarction. No mass, hemorrhage, hydrocephalus or extra-axial collection. Vascular: There is atherosclerotic calcification of the major vessels at the base of the brain. Skull: Negative Sinuses/Orbits: Clear/normal Other: Mastoid effusions. IMPRESSION: No acute finding by CT. Likely old ischemic changes throughout the brain as outlined above. Electronically Signed   By: Nelson Chimes M.D.   On: 08/19/2019 13:48   DG CHEST PORT 1 VIEW  Result Date: 08/20/2019 CLINICAL DATA:  Cardiac surgery. EXAM: PORTABLE CHEST 1 VIEW COMPARISON:  Multiple previous chest films. The most recent is 08/18/2019 FINDINGS: The endotracheal tube, NG tube, feeding tube, left subclavian central venous catheter, left PICC line and bilateral chest tubes are stable. External pacer paddles are noted. The cardiac silhouette, mediastinal and hilar contours are stable. Persistent interstitial edema, bibasilar atelectasis and small effusions. No pneumothorax. IMPRESSION: 1. Stable support apparatus. 2. Persistent interstitial edema, bibasilar  atelectasis and small effusions. Electronically Signed   By: Marijo Sanes M.D.   On: 08/20/2019 07:45     Medications:     Scheduled Medications:  artificial tears   Both Eyes Q8H   aspirin  81 mg Per Tube Daily   chlorhexidine gluconate (MEDLINE KIT)  15 mL Mouth Rinse BID   Chlorhexidine Gluconate Cloth  6 each Topical Daily   collagenase   Topical Daily   docusate  200 mg Per Tube Daily   etomidate  20 mg Intravenous Once   feeding supplement (PROSource TF)  45 mL Per Tube BID   fentaNYL (SUBLIMAZE) injection  200 mcg Intravenous Once   fentaNYL (SUBLIMAZE) injection  50 mcg  Intravenous Once   free water  300 mL Per Tube Q6H   insulin aspart  0-24 Units Subcutaneous Q4H   insulin aspart  5 Units Subcutaneous Q4H   insulin detemir  8 Units Subcutaneous BID   ipratropium  0.5 mg Nebulization Q6H   levalbuterol  1.25 mg Nebulization Q6H   mouth rinse  15 mL Mouth Rinse 10 times per day   metolazone  5 mg Oral Once   midazolam  5 mg Intravenous Once   pantoprazole (PROTONIX) IV  40 mg Intravenous QHS   potassium chloride  40 mEq Per Tube BID   sodium chloride flush  10-40 mL Intracatheter Q12H   sodium chloride flush  10-40 mL Intracatheter Q12H   sodium chloride flush  3 mL Intravenous Q12H   vecuronium  10 mg Intravenous Once    Infusions:  sodium chloride 250 mL (08/13/19 1412)   sodium chloride Stopped (08/13/19 1631)   albumin human     amiodarone 60 mg/hr (08/20/19 0700)   anidulafungin Stopped (08/19/19 1014)   bivalirudin (ANGIOMAX) infusion 0.5 mg/mL (Non-ACS indications) 0.055 mg/kg/hr (08/20/19 0700)   dextrose 5 % Impella 5.0 Purge solution     epinephrine Stopped (08/19/19 1251)   feeding supplement (PIVOT 1.5 CAL) 60 mL/hr at 08/20/19 0700   fentaNYL infusion INTRAVENOUS 150 mcg/hr (08/20/19 0700)   furosemide (LASIX) infusion 8 mg/hr (08/20/19 0700)   lactated ringers 20 mL/hr at 08/10/19 1705   lactated ringers 10  mL/hr at 08/18/19 0054   meropenem (MERREM) IV 1 g (08/20/19 0740)   milrinone 0.125 mcg/kg/min (08/20/19 0700)   norepinephrine (LEVOPHED) Adult infusion 9 mcg/min (08/20/19 0700)   potassium chloride     vancomycin     vasopressin 0.04 Units/min (08/20/19 0700)    PRN Medications: sodium chloride, sodium chloride, dextrose, fentaNYL, metoprolol tartrate, midazolam, morphine injection, ondansetron (ZOFRAN) IV, sodium chloride flush, sodium chloride flush, sodium chloride flush   Assessment/Plan   1. Shock: At this point, think mixed cardiogenic/septic.  Echo with EF 20-25%, moderate RV dysfunction.  VA ECMO post-VF arrest, cannulated 6/25.  Stable s/p return to OR for mediastinal re-exploration due to bleeding on 6/25.  Decannulated from [place] ECMO on 08/06/19. Impella 5.5 placed. Underwent emergent chest re-exploration on 6/29 for tamponade and clot removal. Chest closed 7/2.  Impella 5.5 removed 7/9.  This morning, he is on NE 9, milrinone 0.125, vasopressin 0.04.  Suspect component of septic/vasodilatory shock with fever, elevated WBCs.  Co-ox 52%, CVP 9. Good UOP with Lasix gtt 8 mg/hr. - Will see if we can get him off vasopressin today.  - Continue Lasix 8 mg/hr to keep even I/Os, weight is now below baseline.   2. 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  Prolonged VF arrest on 6/25. Chest closed 7/2.  - ASA  - Hold Crestor with elevated LFTs.  3. Cardiac arrest: VF arrest with prolonged code. Still with episodes of VT/NSVT in setting of low K (difficult to effectively replace).  - K improved, will replace today.   - Decrease amiodarone to 30 mg/hr.   - Eventual evaluation for ICD or lifevest at d/c - Keep K > 4.0 Mg > 2.0 4. Acute hypoxemic respiratory failure: In setting of cardiac arrest.  H/o COPD.  Bibasilar infiltrates, possible HCAP.   - On vent, CCM following, tracheostomy today.    - Getting vancomycin/meropenem/[name] for possible  PNA.   5. Acute blood loss anemia: Now s/p  mediastinal re-exploration x 3. Hgb stable today.  CT drainage has slowed.   - Now on bivalirudin gtt. 6. 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  Platelets have trended back up. Pending confirmatory SRA for HIT.  7. ID: Suspect HCAP, on meropenem/vancomycin/anidulafungin.  WBCs 15, now afebrile. Venous dopplers negative for DVT. PCT 1.99 => 2.35.  Suspect component of septic shock. Cultures negative so far except few Candida in tracheal aspirate. 8. Elevated bilirubin: Mostly direct = likely cholestatic/shock liver/RV failure predominantly.   Abdominal US with evidence for cirrhosis, sludge in GB but no definite acute cholecystitis. - Continue to follow LFTs.    9. Ileus: Improving.  Now getting tube feeds and off TPN. 10. Atrial fibrillation/flutter: Paroxysmal. Back in NSR today.     - Continue amiodarone gtt.  - bivalirudin gtt.  11. Neuro: Does not respond to commands. CT head negative on 7/11.  12. Hypernatremia: Free water boluses 300 cc q6 hrs started.   CRITICAL CARE Performed by: Loralie Champagne  Total critical care time: 40 minutes at bedside  Critical care time was exclusive of separately billable procedures and treating other patients.  Critical care was necessary to treat or prevent imminent or life-threatening deterioration.  Critical care was time spent personally by me on the following activities: development of treatment plan with patient and/or surrogate as well as nursing, discussions with consultants, evaluation of patient's response to treatment, examination of patient, obtaining history from patient or surrogate, ordering and performing treatments and interventions, ordering and review of laboratory studies, ordering and review of radiographic studies, pulse oximetry and re-evaluation of patient's condition.   Length of Stay: [place], MD  08/20/2019, 8:41 AM  Advanced Heart Failure Team Pager [phone]  (M-F; 7a - 4p)  Please contact [facility] for night-coverage after hours (4p -7a ) and weekends on [email or web address]

## 2019-08-20 NOTE — Procedures (Signed)
Procedure: Percutaneous Tracheostomy CPT 31600 Performed by: Dr. Myrla Halsted Bronchoscopy Assistant: Dr. Marchelle Gearing.  Indications: Chronic respiratory failure and need for ongoing mechanical ventilation.  Consent: Signed in chart  Preprocedure: Universal protocol was followed for this procedure. Timeout performed. Anterior neck prepped and draped.  Anesthesia: The patient was intubated and sedated prior to the procedure. Additional midazolam, etomidate, fentanyl and vecuronium were given for sedation and paralysis with close attention to vital signs throughout procedure.   Procedure: The patient was placed in the supine position. The anterior neck was prepped and draped in usual sterile fashion. 1% lidocaine was administered approximately 2 fingerbreadths above the sternal notch for local anesthesia. A 1.5-cm vertical incision was then performed 2 fingerbreadths above the sternal notch. Using a curved Kelly, blunt dissection was performed down to the level of the pretracheal fascia. At this point, the bronchoscope was introduced through the endotracheal tube and the trachea was properly visualized. The endotracheal tube was then gradually withdrawn within the trachea under direct bronchoscopic visualization. Proper midline position was confirmed by bouncing the needle from the tracheostomy tray over the trachea with bronchoscopic examination. The needle was advanced into the trachea and proper positioning was confirmed with direct visualization. The needle was then removed leaving a white outer cannula in position. The wire from the tracheostomy tray was then advanced through the white outer cannula. The cannula was then removed. The small, blue dilator was then advanced over the wire into the trachea. Once proper dilatation was achieved, the dilator was removed. The large, tapered dilator was then advanced over the wire into the trachea. The dilator was removed leaving the wire and white inner cannula in  position. A number 8-0 percutaneous Shiley tracheostomy tube was then advanced over the wire and white inner cannula into the trachea. Proper positioning was confirmed with bronchoscopic visualization. The tracheostomy tube was then sutured in place with four nylon sutures. It was further secured with a tracheostomy tie.  Estimated blood loss: Less than 5 mL.  Complications: None immediate.  CXR ordered.

## 2019-08-20 NOTE — Plan of Care (Signed)
  Problem: Cardiac: Goal: Ability to achieve and maintain adequate cardiopulmonary perfusion will improve Outcome: Progressing   Problem: Cardiac: Goal: Will achieve and/or maintain hemodynamic stability Outcome: Progressing

## 2019-08-20 NOTE — Progress Notes (Signed)
3 Days Post-Op Procedure(s) (LRB): REMOVAL OF IMPELLA LEFT VENTRICULAR ASSIST DEVICE (N/A) TRANSESOPHAGEAL ECHOCARDIOGRAM (TEE) (N/A) Subjective: Moving upper ext spontaneously, not following commands but does track some  Objective: Vital signs in last 24 hours: Temp:  [95.9 F (35.5 C)-98.2 F (36.8 C)] 98.1 F (36.7 C) (07/12 0700) Pulse Rate:  [76-101] 96 (07/12 0700) Cardiac Rhythm: Heart block (07/12 0000) Resp:  [14-28] 25 (07/12 0700) BP: (87-126)/(50-86) 116/62 (07/12 0723) SpO2:  [91 %-100 %] 93 % (07/12 0723) Arterial Line BP: (93-115)/(45-60) 100/49 (07/11 1900) FiO2 (%):  [40 %-50 %] 40 % (07/12 0724) Weight:  [78 kg] 78 kg (07/12 0600)  Hemodynamic parameters for last 24 hours: CVP:  [11 mmHg] 11 mmHg  Intake/Output from previous day: 07/11 0701 - 07/12 0700 In: 4339.7 [I.V.:2341.5; NG/GT:1530; IV Piggyback:298.2] Out: 5390 [Urine:3800; Emesis/NG output:300; Stool:600; Chest Tube:690] Intake/Output this shift: No intake/output data recorded.  General appearance: intubated Heart: regular rate and rhythm Lungs: clear to auscultation bilaterally Abdomen: normal findings: soft, non-tender Wound: clean and dry. CT site open not granulating yet  Lab Results: Recent Labs    08/19/19 0345 08/20/19 0423  WBC 14.4* 14.9*  HGB 10.2* 10.0*  HCT 29.7* 29.4*  PLT 236 283   BMET:  Recent Labs    08/19/19 1445 08/20/19 0423  NA 146* 150*  K 3.2* 3.5  CL 100 104  CO2 34* 34*  GLUCOSE 136* 99  BUN 62* 63*  CREATININE 1.49* 1.35*  CALCIUM 7.8* 7.9*    PT/INR: No results for input(s): LABPROT, INR in the last 72 hours. ABG    Component Value Date/Time   PHART 7.452 (H) 08/18/2019 1241   HCO3 37.0 (H) 08/18/2019 1241   TCO2 39 (H) 08/18/2019 1241   ACIDBASEDEF 1.0 08/08/2019 1115   O2SAT 52.0 08/20/2019 0423   CBG (last 3)  Recent Labs    08/19/19 2334 08/20/19 0333 08/20/19 0719  GLUCAP 106* 83 86    Assessment/Plan: S/P Procedure(s)  (LRB): REMOVAL OF IMPELLA LEFT VENTRICULAR ASSIST DEVICE (N/A) TRANSESOPHAGEAL ECHOCARDIOGRAM (TEE) (N/A) - NEURO- some spontaneous movement, head CT showed some chronic infarcts, no acute changes  CV- co-ox down slightly to 52%  On milrinone and norepi   RESP- VDRF, on 40%. Needs trach. Obvious concern is sternal wound but at this point I think we need to go ahead  RENAL- creatinine down slightly, hypernatremia- start free water via tube  Supplement K  ENDO_ CBG well controlled  Nutrition- severe protein calorie malnutrition. Continue TF  ID- temps down and WBC stable from yesterday, down over past few days  Continue vanco, meropenem and [name]   LOS: 25 days    [name] 08/20/2019

## 2019-08-20 NOTE — Progress Notes (Signed)
Patient ID: Todd [name], male   DOB: [birth year], 64 y.o.   MRN: [id number] EVENING ROUNDS NOTE :     [address]       [place] [id number]             [phone]                 3 Days Post-Op Procedure(s) (LRB): REMOVAL OF IMPELLA LEFT VENTRICULAR ASSIST DEVICE (N/A) TRANSESOPHAGEAL ECHOCARDIOGRAM (TEE) (N/A)  Total Length of Stay:  LOS: 25 days  BP (!) 118/49   Pulse 85   Temp 97.7 F (36.5 C)   Resp 20   Ht 5\' 9"  (1.753 m)   Wt 78 kg   SpO2 100%   BMI 25.39 kg/m   .Intake/Output      07/11 0701 - 07/12 0700 07/12 0701 - 07/13 0700   I.V. (mL/kg) 2341.5 (30) 394.5 (5.1)   Other 170    NG/GT 1530 220   IV Piggyback 298.2 417.4   Total Intake(mL/kg) 4339.7 (55.6) 1031.9 (13.2)   Urine (mL/kg/hr) 3800 (2) 2485 (3.3)   Emesis/NG output 300 100   Stool 600    Chest Tube 690 300   Total Output 5390 2885   Net -1050.3 -1853.2          . sodium chloride 250 mL (08/13/19 1412)  . sodium chloride Stopped (08/13/19 1631)  . albumin human 12.5 g (08/20/19 1337)  . amiodarone 30 mg/hr (08/20/19 1344)  . anidulafungin Stopped (08/20/19 1111)  . bivalirudin (ANGIOMAX) infusion 0.5 mg/mL (Non-ACS indications) 0.055 mg/kg/hr (08/20/19 1516)  . epinephrine Stopped (08/19/19 1251)  . feeding supplement (PIVOT 1.5 CAL) 60 mL/hr at 08/20/19 1450  . fentaNYL infusion INTRAVENOUS 150 mcg/hr (08/20/19 1200)  . furosemide (LASIX) infusion 8 mg/hr (08/20/19 1200)  . lactated ringers 20 mL/hr at 08/10/19 1705  . lactated ringers 10 mL/hr at 08/18/19 0054  . meropenem (MERREM) IV 1 g (08/20/19 1339)  . milrinone 0.125 mcg/kg/min (08/20/19 1200)  . norepinephrine (LEVOPHED) Adult infusion 12 mcg/min (08/20/19 1457)  . vancomycin 150 mL/hr at 08/20/19 1200  . vasopressin 0.04 Units/min (08/20/19 1501)     Lab Results  Component Value Date   WBC 14.9 (H) 08/20/2019   HGB 10.0 (L) 08/20/2019   HCT 29.4 (L) 08/20/2019   PLT 283 08/20/2019   GLUCOSE 110 (H)  08/20/2019   CHOL 203 (H) 07/27/2019   TRIG 213 (H) 08/20/2019   HDL 34 (L) 07/27/2019   LDLCALC 148 (H) 07/27/2019   ALT 357 (H) 08/20/2019   AST 688 (H) 08/20/2019   NA 150 (H) 08/20/2019   K 3.6 08/20/2019   CL 99 08/20/2019   CREATININE 1.50 (H) 08/20/2019   BUN 62 (H) 08/20/2019   CO2 37 (H) 08/20/2019   TSH 2.243 07/31/2019   INR 1.6 (H) 08/20/2019   HGBA1C 9.0 (H) 07/26/2019   Does not follow commands  Bili 12   07/28/2019 MD  Beeper [phone] Office [phone] 08/20/2019 4:39 PM

## 2019-08-20 NOTE — Progress Notes (Signed)
NAME:  Todd [name], MRN:  [id number], DOB:  16-Dec-1955, LOS: 25 ADMISSION DATE:  07/26/2019, CONSULTATION DATE:  08/03/19 REFERRING MD:  Dorris Fetch, CHIEF COMPLAINT:  ECMO   Brief History   VA ECMO for post CABG Vtach arrest  History of present illness   Presented with worsening dyspnea 6/17 c/w CHF exacerbation Cath with severe triple vessel disease Underwent CABG 6/24 Vtach arrest 6/25 Chest opened bedside and cardiac massage initiated as well as multiple cardioversions, amiodarone, bicarb etc Brought to OR and cannulated for VA ECMO PCCM consulted to assist with management Comorbidities include DM, heavy smoking, COPD  Past Medical History  Depression Ischemic cardiomyopathy HTN HLD  Significant Hospital Events   6/24 CABG 6/25 VA cannulation 6/28 decannulated and impella placed 6/29 bedside re-exploration; s/p decannulation. Placement of impella device originally at p8 but decreased to p4 2/2 suction events overnight up to p6. Echo completed and repositioned device. Remained on considerable support with 8 epi and 46 norepi vaso 0.05. continued chest tube output with large clots noted. Heparin thru device. Replacement products ongoing. 60% 8 peep 6/30: bedside mediastinal re-exploration and clean out of hematoma with tamponade and worsening hemodynamics. Pt had large volume transfusion. rebolused with amio 2/2 nsvt episode.  Weight up 48 pounds.  Started diuresis, Lasix drip started. 7/1 iatrogenic respiratory alkalosis, vent rate decreased. 7/2: No significant issues overnight remains on pressors and Impella not tolerating tube feeds with high gastric output awaiting core track placement 7/3: started trickle TF  7/5: Swab removed, CVL and PICC placed.  Consults:  Advanced heart failure, PCCM, TCTS  Procedures:  See below  Significant Diagnostic Tests:  6/22 spirometry with restrictive physiology, preserved FEV1/FVC CXR today with bilateral airspace disease, cannulas in  good position, deep left sulcus that was present yesterday, no clear PTX 6/18 echo: LVEF 20-25%, grade I diastolic dysfunction 7/7: US Abdomen cholelithiasis without cholecystitis. Mild wall thinking in setting of liver disease and ascites.   Micro Data:  COVID and HIV Neg 7/3 respiratory-few Candida tropicalis 7/5 blood>> 7/5 respiratory-few Candida tropicalis 7/7 urine-NG 7/7 blood>> 7/9 wound>>   Antimicrobials:  Vanc6/24>> Cefuroxime 6/24>> 6/25 merrem 6/26-> Anidulafungin 7/8-> Interim history/subjective:  Remains sedated on vent/pressors.  Objective   Blood pressure 116/62, pulse 96, temperature 98.1 F (36.7 C), resp. rate (!) 25, height 5\' 9"  (1.753 m), weight 78 kg, SpO2 93 %. CVP:  [11 mmHg] 11 mmHg  Vent Mode: PRVC FiO2 (%):  [40 %-50 %] 40 % Set Rate:  [20 bmp] 20 bmp Vt Set:  [420 mL] 420 mL PEEP:  [8 cmH20] 8 cmH20 Plateau Pressure:  [16 cmH20-21 cmH20] 20 cmH20   Intake/Output Summary (Last 24 hours) at 08/20/2019 0740 Last data filed at 08/20/2019 0700 Gross per 24 hour  Intake 4339.7 ml  Output 5390 ml  Net -1050.3 ml   Filed Weights   08/19/19 0328 08/19/19 0735 08/20/19 0600  Weight: 79.7 kg 79.7 kg 78 kg    Examination: GEN: ill appearing man in NAD HEENT: ETT in place, minimal secretions CV: RRR, ext warm PULM: scattered rhonci, passive on vent GI: Soft, hypoactive BS, rectal tube in place EXT: Anasarca NEURO: Moving all 4 ext R>L, some purposeful movement, no following commands PSYCH: RASS -1 SKIN: Jaundiced     Resolved Hospital Problem list   N/A  Assessment & Plan:   Cardiogenic/hemorrhagic shock status post CABG complicated by VT arrest, EF 20 to 25% with severe LV dysfunction and moderate RV dysfunction.  S/p VA ECMO (  decanulated 6/28). 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 -AdjustED ventilatory setting to prevent hypoxemia and hypercapnia -Sedation as required to maintain vent tolerance; goal RASS -1.  Continue fentanyl - Trach today after discussion with TCTS  AKI 2/2 shock-stable Hypokalemia -Electrolyte supplemented -Renally dose meds and avoid nephrotoxic meds -Strict I's/O  Liver shock--2/2 shock.  Hyperbilirubinemia Cirrhosis per 7/8 [place] -LFTs are up trending -Continue to monitor  T2DM -Sugars a bit low, lower levemir, continue SSI   SIRS; unclear if source of infection.  Persistent fevers. Plan -Continue broad-spectrum antimicrobials with vancomycin, meropenem and antifungal -Continue to follow blood cultures to finalize -Lower extremity ultrasound neg - Change out foley  Acute blood loss anemia-stable Thrombocytopenia--platelets improving since discontinuation of heparin.  -Serotonin release assay in process -Continue bivalirudin -Continue to monitor CBCs -Lower extremity ultrasounds pending  High risk malnutrition  -Continue Tube Feeds  Acute encephalopathy due to sedation -Work on Engineer, technical sales:  Diet: Tube feeds Pain/Anxiety/Delirium protocol (if indicated): fentanyl VAP protocol (if indicated): in place DVT prophylaxis: bival GI prophylaxis: PPI Glucose control: see above Mobility: BR Code Status: Full Family Communication: per primary Disposition:  ICU      The patient is critically ill with multiple organ systems failure and requires high complexity decision making for assessment and support, frequent evaluation and titration of therapies,  application of advanced monitoring technologies and extensive interpretation of multiple databases. Critical Care Time devoted to patient care services described in this note independent of APP/resident time (if applicable)  is 35 minutes.   Myrla Halsted MD [facility] 08/20/2019 8:01 AM Personal pager: [phone] If unanswered, please page CCM On-call: [phone]

## 2019-08-20 NOTE — Consult Note (Addendum)
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 Pt is critically ill with multiple systemic factors which can impair healing.  Measurement: 8 cm x 8 cm Wound bed: loose darker-colored slough, small amt tan drainage, no odor Topical treatment orders provided for bedside nurses to assist with enzymatic debridement as follows: Apply Santyl to the sacral wound in a nickel thick layer Q day, then cover with a saline moistened gauze, then foam dressing.  Change foam dressing Q 3 days or PRN soiling. WOC team will reassess location weekly to determine if a change in the plan of care is indicated at that time.  Cammie Mcgee MSN, RN, CWOCN, [place], CNS [phone]

## 2019-08-21 ENCOUNTER — Inpatient Hospital Stay (HOSPITAL_COMMUNITY): Payer: Medicare Other

## 2019-08-21 ENCOUNTER — Inpatient Hospital Stay: Payer: Self-pay

## 2019-08-21 DIAGNOSIS — R569 Unspecified convulsions: Secondary | ICD-10-CM

## 2019-08-21 LAB — GLUCOSE, CAPILLARY
Glucose-Capillary: 118 mg/dL — ABNORMAL HIGH (ref 70–99)
Glucose-Capillary: 119 mg/dL — ABNORMAL HIGH (ref 70–99)
Glucose-Capillary: 120 mg/dL — ABNORMAL HIGH (ref 70–99)
Glucose-Capillary: 128 mg/dL — ABNORMAL HIGH (ref 70–99)
Glucose-Capillary: 128 mg/dL — ABNORMAL HIGH (ref 70–99)
Glucose-Capillary: 136 mg/dL — ABNORMAL HIGH (ref 70–99)

## 2019-08-21 LAB — COMPREHENSIVE METABOLIC PANEL
ALT: 353 U/L — ABNORMAL HIGH (ref 0–44)
AST: 597 U/L — ABNORMAL HIGH (ref 15–41)
Albumin: 2.2 g/dL — ABNORMAL LOW (ref 3.5–5.0)
Alkaline Phosphatase: 925 U/L — ABNORMAL HIGH (ref 38–126)
Anion gap: 14 (ref 5–15)
BUN: 68 mg/dL — ABNORMAL HIGH (ref 8–23)
CO2: 37 mmol/L — ABNORMAL HIGH (ref 22–32)
Calcium: 8.2 mg/dL — ABNORMAL LOW (ref 8.9–10.3)
Chloride: 97 mmol/L — ABNORMAL LOW (ref 98–111)
Creatinine, Ser: 1.57 mg/dL — ABNORMAL HIGH (ref 0.61–1.24)
GFR calc Af Amer: 53 mL/min — ABNORMAL LOW (ref >60)
GFR calc non Af Amer: 46 mL/min — ABNORMAL LOW (ref >60)
Glucose, Bld: 158 mg/dL — ABNORMAL HIGH (ref 70–99)
Potassium: 3.7 mmol/L (ref 3.5–5.1)
Sodium: 148 mmol/L — ABNORMAL HIGH (ref 135–145)
Total Bilirubin: 16.1 mg/dL — ABNORMAL HIGH (ref 0.3–1.2)
Total Protein: 5.8 g/dL — ABNORMAL LOW (ref 6.5–8.1)

## 2019-08-21 LAB — TRIGLYCERIDES: Triglycerides: 253 mg/dL — ABNORMAL HIGH (ref <150)

## 2019-08-21 LAB — CBC
HCT: 28.3 % — ABNORMAL LOW (ref 39.0–52.0)
Hemoglobin: 9.3 g/dL — ABNORMAL LOW (ref 13.0–17.0)
MCH: 29.3 pg (ref 26.0–34.0)
MCHC: 32.9 g/dL (ref 30.0–36.0)
MCV: 89.3 fL (ref 80.0–100.0)
Platelets: 319 10*3/uL (ref 150–400)
RBC: 3.17 MIL/uL — ABNORMAL LOW (ref 4.22–5.81)
RDW: 23.2 % — ABNORMAL HIGH (ref 11.5–15.5)
WBC: 15.3 10*3/uL — ABNORMAL HIGH (ref 4.0–10.5)
nRBC: 0.1 % (ref 0.0–0.2)

## 2019-08-21 LAB — RENAL FUNCTION PANEL
Albumin: 2.3 g/dL — ABNORMAL LOW (ref 3.5–5.0)
Anion gap: 16 — ABNORMAL HIGH (ref 5–15)
BUN: 70 mg/dL — ABNORMAL HIGH (ref 8–23)
CO2: 39 mmol/L — ABNORMAL HIGH (ref 22–32)
Calcium: 8.3 mg/dL — ABNORMAL LOW (ref 8.9–10.3)
Chloride: 96 mmol/L — ABNORMAL LOW (ref 98–111)
Creatinine, Ser: 1.69 mg/dL — ABNORMAL HIGH (ref 0.61–1.24)
GFR calc Af Amer: 48 mL/min — ABNORMAL LOW (ref >60)
GFR calc non Af Amer: 42 mL/min — ABNORMAL LOW (ref >60)
Glucose, Bld: 168 mg/dL — ABNORMAL HIGH (ref 70–99)
Phosphorus: 4.7 mg/dL — ABNORMAL HIGH (ref 2.5–4.6)
Potassium: 3.6 mmol/L (ref 3.5–5.1)
Sodium: 151 mmol/L — ABNORMAL HIGH (ref 135–145)

## 2019-08-21 LAB — AMMONIA: Ammonia: 38 umol/L — ABNORMAL HIGH (ref 9–35)

## 2019-08-21 LAB — COOXEMETRY PANEL
Carboxyhemoglobin: 1.4 % (ref 0.5–1.5)
Methemoglobin: 0.8 % (ref 0.0–1.5)
O2 Saturation: 69.2 %
Total hemoglobin: 9.4 g/dL — ABNORMAL LOW (ref 12.0–16.0)

## 2019-08-21 LAB — SEROTONIN RELEASE ASSAY (SRA)
SRA .2 IU/mL UFH Ser-aCnc: 89 % — ABNORMAL HIGH (ref 0–20)
SRA 100IU/mL UFH Ser-aCnc: <1 % (ref 0–20)

## 2019-08-21 LAB — LACTATE DEHYDROGENASE: LDH: 424 U/L — ABNORMAL HIGH (ref 98–192)

## 2019-08-21 LAB — APTT: aPTT: 70 seconds — ABNORMAL HIGH (ref 24–36)

## 2019-08-21 MED ORDER — MAGNESIUM SULFATE 2 GM/50ML IV SOLN
2.0000 g | Freq: Once | INTRAVENOUS | Status: AC
  Administered 2019-08-21: 2 g via INTRAVENOUS
  Filled 2019-08-21: qty 50

## 2019-08-21 MED ORDER — POTASSIUM CHLORIDE 20 MEQ/15ML (10%) PO SOLN: 40.0000 meq | ORAL | Status: DC

## 2019-08-21 MED ORDER — POTASSIUM CHLORIDE 20 MEQ/15ML (10%) PO SOLN
40.0000 meq | Freq: Once | ORAL | Status: AC
  Administered 2019-08-21: 40 meq via ORAL
  Filled 2019-08-21: qty 30

## 2019-08-21 NOTE — Progress Notes (Signed)
NAME:  Todd [name], MRN:  [id number], DOB:  February 01, 1956, LOS: 26 ADMISSION DATE:  07/26/2019, CONSULTATION DATE:  08/03/19 REFERRING MD:  Dorris Fetch, CHIEF COMPLAINT:  ECMO   Brief History   VA ECMO for post CABG Vtach arrest  History of present illness   Presented with worsening dyspnea 6/17 c/w CHF exacerbation Cath with severe triple vessel disease Underwent CABG 6/24 Vtach arrest 6/25 Chest opened bedside and cardiac massage initiated as well as multiple cardioversions, amiodarone, bicarb etc Brought to OR and cannulated for VA ECMO PCCM consulted to assist with management Comorbidities include DM, heavy smoking, COPD  Past Medical History  Depression Ischemic cardiomyopathy HTN HLD  Significant Hospital Events   6/24 CABG 6/25 VA cannulation 6/28 decannulated and impella placed 6/29 bedside re-exploration; s/p decannulation. Placement of impella device originally at p8 but decreased to p4 2/2 suction events overnight up to p6. Echo completed and repositioned device. Remained on considerable support with 8 epi and 46 norepi vaso 0.05. continued chest tube output with large clots noted. Heparin thru device. Replacement products ongoing. 60% 8 peep 6/30: bedside mediastinal re-exploration and clean out of hematoma with tamponade and worsening hemodynamics. Pt had large volume transfusion. rebolused with amio 2/2 nsvt episode.  Weight up 48 pounds.  Started diuresis, Lasix drip started. 7/1 iatrogenic respiratory alkalosis, vent rate decreased. 7/2: No significant issues overnight remains on pressors and Impella not tolerating tube feeds with high gastric output awaiting core track placement 7/3: started trickle TF  7/5: Swab removed, CVL and PICC placed. 7/13 Trach  Consults:  Advanced heart failure, PCCM, TCTS  Procedures:  See below  Significant Diagnostic Tests:  6/22 spirometry with restrictive physiology, preserved FEV1/FVC CXR today with bilateral airspace disease,  cannulas in good position, deep left sulcus that was present yesterday, no clear PTX 6/18 echo: LVEF 20-25%, grade I diastolic dysfunction 7/7: US Abdomen cholelithiasis without cholecystitis. Mild wall thinking in setting of liver disease and ascites.   Micro Data:  COVID and HIV Neg 7/3 respiratory-few Candida tropicalis 7/5 blood>> 7/5 respiratory-few Candida tropicalis 7/7 urine-NG 7/7 blood>> 7/9 wound>>   Antimicrobials:  Vanc6/24>> Cefuroxime 6/24>> 6/25 merrem 6/26-> Anidulafungin 7/8-> Interim history/subjective:  On vent/pressors, poorly responsive.  Objective   Blood pressure (!) 104/53, pulse (!) 103, temperature 98.7 F (37.1 C), temperature source Axillary, resp. rate (!) 25, height 5\' 9"  (1.753 m), weight 78 kg, SpO2 96 %. CVP:  [8 mmHg-9 mmHg] 9 mmHg  Vent Mode: PRVC FiO2 (%):  [40 %-100 %] 40 % Set Rate:  [20 bmp] 20 bmp Vt Set:  [420 mL] 420 mL PEEP:  [8 cmH20] 8 cmH20 Plateau Pressure:  [18 cmH20-21 cmH20] 21 cmH20   Intake/Output Summary (Last 24 hours) at 08/21/2019 0710 Last data filed at 08/21/2019 0645 Gross per 24 hour  Intake 3719.91 ml  Output 6581 ml  Net -2861.09 ml   Filed Weights   08/19/19 0328 08/19/19 0735 08/20/19 0600  Weight: 79.7 kg 79.7 kg 78 kg    Examination: GEN: ill appearing man in NAD HEENT: ETT in place, minimal secretions CV: RRR, ext warm PULM: scattered rhonci, passive on vent GI: Soft, hypoactive BS, rectal tube in place EXT: Anasarca NEURO: Moving all 4 ext R>L, some purposeful movement, no following commands PSYCH: RASS -1 SKIN: Jaundiced  Bili worse INR 1.6 Renal function stable  Resolved Hospital Problem list   N/A  Assessment & Plan:   Cardiogenic/hemorrhagic shock status post CABG complicated by VT arrest, EF 20 to  25% with severe LV dysfunction and moderate RV dysfunction.  S/p VA ECMO (decanulated 6/28). 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 -Consider PS trial today  AKI 2/2 shock-stable Hypokalemia -Electrolyte supplemented -Renally dose meds and avoid nephrotoxic meds -Strict I's/O  Liver shock--2/2 shock.  Hyperbilirubinemia Cirrhosis per 7/8 [place] -LFTs are up trending -Continue to monitor  T2DM -sugars look good, levemir and SSI   Fever curve, improved- cultures negative to date. LE duplex neb. -Continue broad-spectrum antimicrobials with vancomycin, meropenem and antifungal -Continue to follow blood cultures to finalize  Acute blood loss anemia-stable Thrombocytopenia--platelets improving since discontinuation of heparin.  -Serotonin release assay in process -Continue bivalirudin -Continue to monitor CBCs  High risk malnutrition  -Continue Tube Feeds  Acute encephalopathy due to sedation -Check ammonia, EEG  Best practice:  Diet: Tube feeds Pain/Anxiety/Delirium protocol (if indicated): fentanyl VAP protocol (if indicated): in place DVT prophylaxis: bival GI prophylaxis: PPI Glucose control: see above Mobility: BR Code Status: Full Family Communication: per primary Disposition:  ICU     The patient is critically ill with multiple organ systems failure and requires high complexity decision making for assessment and support, frequent evaluation and titration of therapies, application of advanced monitoring technologies and extensive interpretation of multiple databases. Critical Care Time devoted to patient care services described in  this note independent of APP/resident time (if applicable)  is 32 minutes.   Myrla Halsted MD [facility] 08/21/2019 7:10 AM Personal pager: [phone] If unanswered, please page CCM On-call: [phone]

## 2019-08-21 NOTE — Progress Notes (Signed)
Nutrition Follow-up  DOCUMENTATION CODES:   Non-severe (moderate) malnutrition in context of acute illness/injury  INTERVENTION:   Tube Feeding via 10 fr small bore feeding tube  Pivot 1.5 at 60 ml/hr Prosource TF 45 mL BID Provides 157 g of protein, 2240 kcals, 1094 mL of free water Meets 100% of estimated calorie and protein needs  NUTRITION DIAGNOSIS:   Moderate Malnutrition related to acute illness as evidenced by mild fat depletion, mild muscle depletion, moderate muscle depletion.  Being addressed via TF   GOAL:   Patient will meet greater than or equal to 90% of their needs  Met  MONITOR:   Vent status, TF tolerance, Labs, Weight trends  REASON FOR ASSESSMENT:   Consult  (EMCO tube feeding recommendations)  ASSESSMENT:   Patient with PMH significant for CHF, COPD, HTN, DM, HLD, MDD, and BPH. Presents this admission with CHF exacerbation.   6/24- s/p CABG x4 6/25- VT arrest, emergent sternotomy, VA ECMO cannulation 6/28- De-cannulated, Impella placed 6/29-Bedside Mediastinal Exploration 2/2 tamponade with multiple blood clots removed 7/02 OR for sternal closure with application of wound vac, Cortrak placed but post-pyloric placement unsuccessful 7/03 TF stared at 20 ml/hr but pt with emesis, OG to LWS with 850 mL out 7/04 TPN started at 30 ml/hr 7/05 TPN  remains at 30 ml/hr 7/06 TPN at 55 ml/hr, IR placed 10 fr post-pyloric feeding tube. Trickle TF initiated 7/07 TF titration, TPN discontinued 7/09 HIT positive, bivalirudin started 7/09 Impella removed 7/12 Trach placed  Pt remains on vent support via trach; requiring levophed and vasopressin gtt as well as milrinone, amiodarone gtt  Tolerating Pivot 1.5 at 60 ml.hr, Pro-Source TF 45 mL BID, Free water flush of 300 mL q 6 hours via small bore feeding tube  Labs: sodium 148 (H), phosphorus 5.6, Alk Phos 925 Meds: lasix drip, ss novolog, novolog q 4 hours, levemir, KCl   Diet Order:   Diet Order             Diet NPO time specified  Diet effective midnight                 EDUCATION NEEDS:   Education needs have been addressed  Skin:  Skin Assessment: Skin Integrity Issues: Skin Integrity Issues:: Unstageable DTI: n/a Unstageable: Sacrum (DTI evolved to unstageable)-WOC RN following Incisions: R leg, R groin Other: open chest  Last BM:  7/13  Height:   Ht Readings from Last 1 Encounters:  08/10/19 5' 9" (1.753 m)    Weight:   Wt Readings from Last 1 Encounters:  08/20/19 78 kg    BMI:  Body mass index is 25.39 kg/m.  Estimated Nutritional Needs:   Kcal:  8937-3428 kcals  Protein:  130-160 g  Fluid:  >/= 1.8 L/day   Kerman Passey MS, RDN, LDN, CNSC Registered Dietitian III Clinical Nutrition RD Pager and On-Call Pager Number Located in [place]

## 2019-08-21 NOTE — Progress Notes (Signed)
ANTICOAGULATION CONSULT NOTE - Follow Up Consult  Pharmacy Consult for heparin Indication: ECMO > impella  Labs: Recent Labs    08/19/19 0345 08/19/19 0345 08/19/19 1222 08/19/19 1445 08/20/19 0423 08/20/19 0423 08/20/19 1202 08/20/19 1507 08/21/19 0406  HGB 10.2*   < >  --   --  10.0*  --   --   --  9.3*  HCT 29.7*  --   --   --  29.4*  --   --   --  28.3*  PLT 236  --   --   --  283  --   --   --  319  APTT 76*   < > 74*  --  68*  --   --   --  70*  LABPROT  --   --   --   --   --   --  18.7*  --   --   INR  --   --   --   --   --   --  1.6*  --   --   CREATININE 1.53*  --   --    < > 1.35*   < > 1.45* 1.50* 1.57*   < > = values in this interval not displayed.    Assessment: 64 yo male s/p ECMO, now on Impella. Chest closed 7/2. CT drainage decreased.    HIT ab sent a few days ago and returned as 2.6 - positive, switched from heparin to bivalirudin.  APTT this morning is therapeutic at 70, on bivalirudin@0 .055 mg/kg/hr. Hgb 9.3, plt 319. LDH 424. No s/sx of bleeding or infusion issues.   Awaiting confirmatory SRA for HIT - in process (send-out lab)  Goals of Therapy: APTT 50 -70 s  Plan: Continue bivalirudin at 0.055 mg/kg/hr Daily aPTT. Monitor daily aPTT, CBC, and for s/sx of bleeding  Sherron Monday, PharmD, BCCCP Clinical Pharmacist  Phone: [phone] 08/21/2019 7:16 AM  Please check AMION for all [facility] phone numbers After 10:00 PM, call Main Pharmacy [phone]

## 2019-08-21 NOTE — Progress Notes (Signed)
Received order for PICC  Patient presently with left sided PICC and left subclavian central line.  Floor RN states PICC is to replace central line due to patient having fevers.  Discuss concerns of placing new PICC with possible infections and fevers.  Floor RN was to discuss with MD.

## 2019-08-21 NOTE — Procedures (Addendum)
Patient Name: Todd [name]  MRN: [id number]  Epilepsy Attending: Charlsie Quest  Referring Physician/Provider: Dr Levon Hedger Date: 08/21/2019 Duration: 25.38 mins  Patient history: 64yo M who presented with cardiogenic/hemorrhagic shock status post CABG complicated by VT arrest , continues to be encephalopathic. EEG to evaluate for seizure  Level of alertness: lethargic  AEDs during EEG study: None  Technical aspects: This EEG study was done with scalp electrodes positioned according to the 10-20 International system of electrode placement. Electrical activity was acquired at a sampling rate of 500Hz  and reviewed with a high frequency filter of 70Hz  and a low frequency filter of 1Hz . EEG data were recorded continuously and digitally stored.   Description: No posterior dominant rhythm was seen. EEG showed continuous generalized polymorphic mixed frequencies with predominantly 5-9 Hz theta-alpha activity as well as intermittent 2-3hz  delta slowing.  Hyperventilation and photic stimulation were not performed.     ABNORMALITY -Continuous slow, generalized  IMPRESSION: This study is suggestive of moderate to severe diffuse encephalopathy, nonspecific etiology. No seizures or epileptiform discharges were seen throughout the recording.  [name] 

## 2019-08-21 NOTE — Progress Notes (Signed)
PT Cancellation/Discharge Note  Patient Details Name: Todd [name] MRN: [id number] DOB: 1955-05-19   Cancelled Treatment:    Reason Eval/Treat Not Completed: Patient not medically ready. Post trach order received. Pt not responsive or following commands. Will sign off. Please reorder when appropriate.   Angelina Ok [facility] 08/21/2019, 11:03 AM Skip Mayer PT Acute Rehabilitation Services Pager [phone] Office [phone]

## 2019-08-21 NOTE — Progress Notes (Signed)
RN took over care of Mr. Broz at approximately 0345. Neuro changes noted. Sedation turned off to better assess Mr. Escher with minimal progress noted. Dr. Golden Circle and Dr. Adaline Sill informed of the neuro changes of Mr. Nelles at the bedside. EEG ordered along with an ammonia level lab. Report given and day shift RN updated on POC.

## 2019-08-21 NOTE — Progress Notes (Addendum)
Patient ID: Todd [name], male   DOB: 1955/10/01, 64 y.o.   MRN: [id number]     Advanced Heart Failure Rounding Note  PCP-Cardiologist: No primary care provider on file.   Subjective:    08/02/19 CABG 08/03/19 VF arrest. 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  MAP stable.  Co-ox 69%.   In NSR on amiodarone at 30, no further NSVT.   Lasix gtt currently at 8 mg/hr, I/Os negative with good UOP (net negative 2.8L ), CVP 7-8.    Afebrile, WBCs 26=>21=>23=>19=>14=>15=>15.  He is on vancomycin, meropenem, anidulafungin. Few Candida in trach aspirate. Venous dopplers negative for DVT.   Ileus, improving.  Now getting tube feeds via post-pyloric tube.  Off TPN.   Creatinine 1.46 => 1.53 => 1.35 => 1.57.  Na 148 this morning.     Patient not following commands with sedation wean, CT head negative 7/11.   Jaundiced with consistently elevated bilirubin (direct), tbili up to 16.   Objective:   Weight Range: 78 kg Body mass index is 25.39 kg/m.   Vital Signs:   Temp:  [96.3 F (35.7 C)-98.8 F (37.1 C)] 98.7 F (37.1 C) (07/12 2321) Pulse Rate:  [73-104] 103 (07/13 0700) Resp:  [10-29] 25 (07/13 0700) BP: (91-139)/(49-89) 104/53 (07/13 0400) SpO2:  [92 %-100 %] 96 % (07/13 0700) Arterial Line BP: (62-152)/(35-98) 99/58 (07/13 0330) FiO2 (%):  [40 %-100 %] 40 % (07/13 0326) Last BM Date: 08/20/19  Weight change: Filed Weights   08/19/19 0328 08/19/19 0735 08/20/19 0600  Weight: 79.7 kg 79.7 kg 78 kg    Intake/Output:   Intake/Output Summary (Last 24 hours) at 08/21/2019 0712 Last data  filed at 08/21/2019 0645 Gross per 24 hour  Intake 3719.91 ml  Output 6581 ml  Net -2861.09 ml      Physical Exam    General: NAD, trach Neck: JVP not elevated, no thyromegaly or thyroid nodule.  Lungs: Decreased at bases.  CV: Nondisplaced PMI.  Heart regular S1/S2, no S3/S4, no murmur.  1+ LE edema. Abdomen: Soft, nontender, no hepatosplenomegaly, no distention.  Skin: Jaundiced  Neurologic: Moves right arm, does not respond to commands.  Extremities: No clubbing or cyanosis.  HEENT: Normal.    Telemetry   NSR 80s Personally reviewed   Labs    CBC Recent Labs    08/20/19 0423 08/21/19 0406  WBC 14.9* 15.3*  NEUTROABS 12.8*  --   HGB 10.0* 9.3*  HCT 29.4* 28.3*  MCV 86.2 89.3  PLT 283 630   Basic Metabolic Panel Recent Labs    08/20/19 0423 08/20/19 0423 08/20/19 1202 08/20/19 1202 08/20/19 1507 08/21/19 0406  NA 150*   < > 150*   < > 150* 148*  K 3.5   < > 4.0   < > 3.6 3.7  CL 104   < > 102   < > 99 97*  CO2 34*   < > 35*   < > 37* 37*  GLUCOSE 99   < > 112*   < > 110* 158*  BUN 63*   < > 58*   < > 62* 68*  CREATININE 1.35*   < >  1.45*   < > 1.50* 1.57*  CALCIUM 7.9*   < > 8.0*   < > 8.2* 8.2*  MG 2.0  --  2.0  --   --   --   PHOS 3.6   < > 4.3  --  5.6*  --    < > = values in this interval not displayed.   Liver Function Tests Recent Labs    08/20/19 0423 08/20/19 0423 08/20/19 1507 08/21/19 0406  AST 688*  --   --  597*  ALT 357*  --   --  353*  ALKPHOS 665*  --   --  925*  BILITOT 12.8*  --   --  16.1*  PROT 5.7*  --   --  5.8*  ALBUMIN 1.4*   < > 1.9* 2.2*   < > = values in this interval not displayed.   No results for input(s): LIPASE, AMYLASE in the last 72 hours. Cardiac Enzymes No results for input(s): CKTOTAL, CKMB, CKMBINDEX, TROPONINI in the last 72 hours.  BNP: BNP (last 3 results) Recent Labs    07/26/19 1236  BNP 2,568.2*    ProBNP (last 3 results) No results for input(s): PROBNP in the last 8760  hours.   D-Dimer No results for input(s): DDIMER in the last 72 hours. Hemoglobin A1C No results for input(s): HGBA1C in the last 72 hours. Fasting Lipid Panel Recent Labs    08/21/19 0406  TRIG 253*   Thyroid Function Tests No results for input(s): TSH, T4TOTAL, T3FREE, THYROIDAB in the last 72 hours.  Invalid input(s): FREET3  Other results:   Imaging    DG Chest Port 1 View  Result Date: 08/20/2019 CLINICAL DATA:  Tracheostomy. EXAM: PORTABLE CHEST 1 VIEW COMPARISON:  08/20/2019 FINDINGS: Tracheostomy appears well positioned. Soft feeding tube enters the abdomen. Central line tip in the SVC above the right atrium. Bilateral chest tubes as seen previously. No pneumothorax. Edema and volume loss persists. No worsening or new finding otherwise. IMPRESSION: Good appearance following tracheostomy placement. No complicating feature. Other lines and tubes unchanged. No pneumothorax. Edema and atelectasis as seen previously. Electronically Signed   By: Nelson Chimes M.D.   On: 08/20/2019 14:12     Medications:     Scheduled Medications: . artificial tears   Both Eyes Q8H  . aspirin  81 mg Per Tube Daily  . chlorhexidine gluconate (MEDLINE KIT)  15 mL Mouth Rinse BID  . Chlorhexidine Gluconate Cloth  6 each Topical Daily  . collagenase   Topical Daily  . docusate  200 mg Per Tube Daily  . feeding supplement (PROSource TF)  45 mL Per Tube BID  . fentaNYL (SUBLIMAZE) injection  50 mcg Intravenous Once  . free water  300 mL Per Tube Q6H  . insulin aspart  0-24 Units Subcutaneous Q4H  . insulin aspart  5 Units Subcutaneous Q4H  . insulin detemir  8 Units Subcutaneous BID  . ipratropium  0.5 mg Nebulization Q6H  . levalbuterol  1.25 mg Nebulization Q6H  . mouth rinse  15 mL Mouth Rinse 10 times per day  . pantoprazole (PROTONIX) IV  40 mg Intravenous QHS  . potassium chloride  40 mEq Per Tube BID  . sodium chloride flush  10-40 mL Intracatheter Q12H  . sodium chloride flush   10-40 mL Intracatheter Q12H  . sodium chloride flush  3 mL Intravenous Q12H    Infusions: . sodium chloride 250 mL (08/13/19 1412)  . sodium chloride Stopped (  08/13/19 1631)  . albumin human Stopped (08/21/19 0216)  . amiodarone 30 mg/hr (08/21/19 0400)  . anidulafungin Stopped (08/20/19 1111)  . bivalirudin (ANGIOMAX) infusion 0.5 mg/mL (Non-ACS indications) 0.055 mg/kg/hr (08/21/19 0400)  . epinephrine Stopped (08/19/19 1251)  . feeding supplement (PIVOT 1.5 CAL) 1,000 mL (08/20/19 2354)  . fentaNYL infusion INTRAVENOUS 125 mcg/hr (08/21/19 0400)  . furosemide (LASIX) infusion 8 mg/hr (08/21/19 0400)  . lactated ringers 20 mL/hr at 08/10/19 1705  . lactated ringers 10 mL/hr at 08/18/19 0054  . meropenem (MERREM) IV 1 g (08/21/19 0533)  . milrinone 0.125 mcg/kg/min (08/21/19 0400)  . norepinephrine (LEVOPHED) Adult infusion 28 mcg/min (08/21/19 0400)  . vancomycin Stopped (08/20/19 1219)  . vasopressin 0.01 Units/min (08/21/19 0400)    PRN Medications: sodium chloride, sodium chloride, dextrose, fentaNYL, metoprolol tartrate, midazolam, morphine injection, ondansetron (ZOFRAN) IV, sodium chloride flush, sodium chloride flush, sodium chloride flush   Assessment/Plan   1. Shock: At this point, think mixed cardiogenic/septic.  Echo with EF 20-25%, moderate RV dysfunction.  VA ECMO post-VF arrest, cannulated 6/25.  Stable s/p return to OR for mediastinal re-exploration due to bleeding on 6/25.  Decannulated from [place] ECMO on 08/06/19. Impella 5.5 placed. Underwent emergent chest re-exploration on 6/29 for tamponade and clot removal. Chest closed 7/2.  Impella 5.5 removed 7/9.  This morning, he is on NE 28, milrinone 0.125, vasopressin 0.04.  Suspect component of septic/vasodilatory shock with prior fever, elevated WBCs.  Co-ox 69%, CVP 7-8. Good UOP with Lasix gtt 8 mg/hr, I/Os negative. - Stopping sedation, continue to wean pressors.  - Decrease Lasix gtt to 4 mg/hr.    2. CAD: 3VD, s/p  CABG x 4 with LIMA-LAD, SVG-D1, SVG-PDA, and radial-OM1 on 6/24.  Prolonged VF arrest on 6/25. Chest closed 7/2.  - ASA  - Hold Crestor with elevated LFTs.  3. Cardiac arrest: VF arrest with prolonged code. Still with episodes of VT/NSVT in setting of low K (difficult to effectively replace).  - K improved, will replace today.   - Continue amiodarone 30 mg/hr.   - Eventual evaluation for ICD or lifevest at d/c - Keep K > 4.0 Mg > 2.0 4. Acute hypoxemic respiratory failure: In setting of cardiac arrest.  H/o COPD.  Bibasilar infiltrates, possible HCAP.  Now with tracheostomy.  - Getting vancomycin/meropenem/[name] for possible PNA => will check with Dr. Roxan Hockey about stop date, has been on 20 days and afebrile now.   5. Acute blood loss anemia: Now s/p mediastinal re-exploration x 3. Hgb stable today.  CT drainage has slowed.   - Now on bivalirudin gtt. 6. 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  Platelets have trended back up. Pending confirmatory SRA for HIT.  7. ID: Suspect HCAP, on meropenem/vancomycin/anidulafungin.  WBCs 15, now afebrile. Venous dopplers negative for DVT. PCT 1.99 => 2.35.  Suspect component of septic shock. Cultures negative so far except few Candida in tracheal aspirate. - Will discuss stop date on abx with Dr. Roxan Hockey.  8. Elevated bilirubin: Mostly direct = likely cholestatic/shock liver/RV failure predominantly.   Abdominal US with evidence for cirrhosis, sludge in GB but no definite acute cholecystitis. - Continue to follow LFTs.   - Check NH3 with lack of responsiveness.   9. Ileus: Improving.  Now getting tube feeds and off TPN. 10. Atrial fibrillation/flutter: Paroxysmal. Back in NSR today.     - Continue amiodarone gtt.  - bivalirudin gtt.  11. Neuro: Does not respond to commands. CT head negative on 7/11.  -  EEG - NH3 - Stop sedateion.  12. Hypernatremia: Free water boluses 300 cc q6 hrs started, Na lower at 148.   CRITICAL  CARE Performed by: Loralie Champagne  Total critical care time: 40 minutes at bedside  Critical care time was exclusive of separately billable procedures and treating other patients.  Critical care was necessary to treat or prevent imminent or life-threatening deterioration.  Critical care was time spent personally by me on the following activities: development of treatment plan with patient and/or surrogate as well as nursing, discussions with consultants, evaluation of patient's response to treatment, examination of patient, obtaining history from patient or surrogate, ordering and performing treatments and interventions, ordering and review of laboratory studies, ordering and review of radiographic studies, pulse oximetry and re-evaluation of patient's condition.   Length of Stay: [place], MD  08/21/2019, 7:12 AM  Advanced Heart Failure Team Pager [phone] (M-F; [place])  Please contact [facility] for night-coverage after hours (4p -7a ) and weekends on [email or web address]

## 2019-08-21 NOTE — Progress Notes (Signed)
SLP Cancellation Note  Patient Details Name: Todd [name] MRN: [id number] DOB: 02-10-1955   Cancelled treatment:       Reason Eval/Treat Not Completed: Medical issues which prohibited therapy (Pt is on the vent at this time. SLP will follow up. )  Emberlee Sortino I. Vear Clock, MS, CCC-SLP Acute Rehabilitation Services Office number [phone] Pager [phone]  Scheryl Marten 08/21/2019, 1:25 PM

## 2019-08-21 NOTE — Progress Notes (Signed)
4 Days Post-Op Procedure(s) (LRB): REMOVAL OF IMPELLA LEFT VENTRICULAR ASSIST DEVICE (N/A) TRANSESOPHAGEAL ECHOCARDIOGRAM (TEE) (N/A) Subjective: Sedated  Objective: Vital signs in last 24 hours: Temp:  [96.3 F (35.7 C)-98.7 F (37.1 C)] 98.7 F (37.1 C) (07/12 2321) Pulse Rate:  [73-104] 103 (07/13 0700) Cardiac Rhythm: Normal sinus rhythm (07/13 0430) Resp:  [10-29] 25 (07/13 0700) BP: (91-139)/(49-89) 104/53 (07/13 0400) SpO2:  [92 %-100 %] 96 % (07/13 0700) Arterial Line BP: (62-152)/(35-98) 99/58 (07/13 0330) FiO2 (%):  [40 %-100 %] 40 % (07/13 0326)  Hemodynamic parameters for last 24 hours: CVP:  [8 mmHg-9 mmHg] 9 mmHg  Intake/Output from previous day: 07/12 0701 - 07/13 0700 In: 3719.9 [I.V.:1613.9; NG/GT:1490; IV Piggyback:616] Out: 6771 [Urine:5835; Emesis/NG output:100; Chest Tube:836] Intake/Output this shift: Total I/O In: 289.9 [I.V.:289.9] Out: -   General appearance: toxic Neurologic: unresponsive, some spontaneous movement Heart: regular rate and rhythm Lungs: clear to auscultation bilaterally Abdomen: nondistended Extremities: edema generalized Wound: intact, Ct site packed  Lab Results: Recent Labs    08/20/19 0423 08/21/19 0406  WBC 14.9* 15.3*  HGB 10.0* 9.3*  HCT 29.4* 28.3*  PLT 283 319   BMET:  Recent Labs    08/20/19 1507 08/21/19 0406  NA 150* 148*  K 3.6 3.7  CL 99 97*  CO2 37* 37*  GLUCOSE 110* 158*  BUN 62* 68*  CREATININE 1.50* 1.57*  CALCIUM 8.2* 8.2*    PT/INR:  Recent Labs    08/20/19 1202  LABPROT 18.7*  INR 1.6*   ABG    Component Value Date/Time   PHART 7.452 (H) 08/18/2019 1241   HCO3 37.0 (H) 08/18/2019 1241   TCO2 39 (H) 08/18/2019 1241   ACIDBASEDEF 1.0 08/08/2019 1115   O2SAT 69.2 08/21/2019 0406   CBG (last 3)  Recent Labs    08/20/19 2319 08/21/19 0404 08/21/19 0729  GLUCAP 154* 136* 128*    Assessment/Plan: S/P Procedure(s) (LRB): REMOVAL OF IMPELLA LEFT VENTRICULAR ASSIST DEVICE  (N/A) TRANSESOPHAGEAL ECHOCARDIOGRAM (TEE) (N/A) -Remains critically ill CV- in SR with PACs this AM, on amiodarone  Co-ox improved to 69 on milrinone 0.125, vasopressin being weaned, but levophed increased over past 24 hours RESP- VDRF, trached yesterday, vent per CCM RENAL- creatinine up slightly, lasix decreased ENDO- CBG mildly elevated ID- febrile to 101 this AM, diaphoretic. On vanco, meropenem and [name]  PCT 2 down from 2.3, but still elevated, WBC up slightly  Would continue antibiotics for now  Has central line and PICC, will see if we can consolidate drips and dc central  GI- tolerating TF. 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

## 2019-08-21 NOTE — Progress Notes (Signed)
EEG complete - results pending 

## 2019-08-22 DIAGNOSIS — R404 Transient alteration of awareness: Secondary | ICD-10-CM

## 2019-08-22 DIAGNOSIS — R509 Fever, unspecified: Secondary | ICD-10-CM

## 2019-08-22 DIAGNOSIS — Z930 Tracheostomy status: Secondary | ICD-10-CM

## 2019-08-22 LAB — BASIC METABOLIC PANEL
Anion gap: 16 — ABNORMAL HIGH (ref 5–15)
BUN: 74 mg/dL — ABNORMAL HIGH (ref 8–23)
CO2: 40 mmol/L — ABNORMAL HIGH (ref 22–32)
Calcium: 8.4 mg/dL — ABNORMAL LOW (ref 8.9–10.3)
Chloride: 94 mmol/L — ABNORMAL LOW (ref 98–111)
Creatinine, Ser: 1.57 mg/dL — ABNORMAL HIGH (ref 0.61–1.24)
GFR calc Af Amer: 53 mL/min — ABNORMAL LOW (ref >60)
GFR calc non Af Amer: 46 mL/min — ABNORMAL LOW (ref >60)
Glucose, Bld: 216 mg/dL — ABNORMAL HIGH (ref 70–99)
Potassium: 3.4 mmol/L — ABNORMAL LOW (ref 3.5–5.1)
Sodium: 150 mmol/L — ABNORMAL HIGH (ref 135–145)

## 2019-08-22 LAB — GLUCOSE, CAPILLARY
Glucose-Capillary: 117 mg/dL — ABNORMAL HIGH (ref 70–99)
Glucose-Capillary: 163 mg/dL — ABNORMAL HIGH (ref 70–99)
Glucose-Capillary: 175 mg/dL — ABNORMAL HIGH (ref 70–99)
Glucose-Capillary: 186 mg/dL — ABNORMAL HIGH (ref 70–99)
Glucose-Capillary: 204 mg/dL — ABNORMAL HIGH (ref 70–99)
Glucose-Capillary: 88 mg/dL (ref 70–99)

## 2019-08-22 LAB — RENAL FUNCTION PANEL
Albumin: 2.1 g/dL — ABNORMAL LOW (ref 3.5–5.0)
Albumin: 2.1 g/dL — ABNORMAL LOW (ref 3.5–5.0)
Anion gap: 10 (ref 5–15)
Anion gap: 15 (ref 5–15)
BUN: 72 mg/dL — ABNORMAL HIGH (ref 8–23)
BUN: 75 mg/dL — ABNORMAL HIGH (ref 8–23)
CO2: 40 mmol/L — ABNORMAL HIGH (ref 22–32)
CO2: 40 mmol/L — ABNORMAL HIGH (ref 22–32)
Calcium: 7.7 mg/dL — ABNORMAL LOW (ref 8.9–10.3)
Calcium: 8.2 mg/dL — ABNORMAL LOW (ref 8.9–10.3)
Chloride: 98 mmol/L (ref 98–111)
Chloride: 96 mmol/L — ABNORMAL LOW (ref 98–111)
Creatinine, Ser: 1.46 mg/dL — ABNORMAL HIGH (ref 0.61–1.24)
Creatinine, Ser: 1.57 mg/dL — ABNORMAL HIGH (ref 0.61–1.24)
GFR calc Af Amer: 58 mL/min — ABNORMAL LOW (ref >60)
GFR calc Af Amer: 53 mL/min — ABNORMAL LOW (ref >60)
GFR calc non Af Amer: 50 mL/min — ABNORMAL LOW (ref >60)
GFR calc non Af Amer: 46 mL/min — ABNORMAL LOW (ref >60)
Glucose, Bld: 226 mg/dL — ABNORMAL HIGH (ref 70–99)
Glucose, Bld: 199 mg/dL — ABNORMAL HIGH (ref 70–99)
Phosphorus: 4.9 mg/dL — ABNORMAL HIGH (ref 2.5–4.6)
Phosphorus: 5.8 mg/dL — ABNORMAL HIGH (ref 2.5–4.6)
Potassium: 3.5 mmol/L (ref 3.5–5.1)
Potassium: 3.2 mmol/L — ABNORMAL LOW (ref 3.5–5.1)
Sodium: 148 mmol/L — ABNORMAL HIGH (ref 135–145)
Sodium: 151 mmol/L — ABNORMAL HIGH (ref 135–145)

## 2019-08-22 LAB — CBC
HCT: 27.3 % — ABNORMAL LOW (ref 39.0–52.0)
Hemoglobin: 8.6 g/dL — ABNORMAL LOW (ref 13.0–17.0)
MCH: 28.7 pg (ref 26.0–34.0)
MCHC: 31.5 g/dL (ref 30.0–36.0)
MCV: 91 fL (ref 80.0–100.0)
Platelets: 359 10*3/uL (ref 150–400)
RBC: 3 MIL/uL — ABNORMAL LOW (ref 4.22–5.81)
RDW: 25.1 % — ABNORMAL HIGH (ref 11.5–15.5)
WBC: 17.9 10*3/uL — ABNORMAL HIGH (ref 4.0–10.5)
nRBC: 0.3 % — ABNORMAL HIGH (ref 0.0–0.2)

## 2019-08-22 LAB — COMPREHENSIVE METABOLIC PANEL
ALT: 317 U/L — ABNORMAL HIGH (ref 0–44)
AST: 554 U/L — ABNORMAL HIGH (ref 15–41)
Albumin: 2.4 g/dL — ABNORMAL LOW (ref 3.5–5.0)
Alkaline Phosphatase: 860 U/L — ABNORMAL HIGH (ref 38–126)
Anion gap: 18 — ABNORMAL HIGH (ref 5–15)
BUN: 74 mg/dL — ABNORMAL HIGH (ref 8–23)
CO2: 38 mmol/L — ABNORMAL HIGH (ref 22–32)
Calcium: 8.2 mg/dL — ABNORMAL LOW (ref 8.9–10.3)
Chloride: 94 mmol/L — ABNORMAL LOW (ref 98–111)
Creatinine, Ser: 1.71 mg/dL — ABNORMAL HIGH (ref 0.61–1.24)
GFR calc Af Amer: 48 mL/min — ABNORMAL LOW (ref >60)
GFR calc non Af Amer: 41 mL/min — ABNORMAL LOW (ref >60)
Glucose, Bld: 140 mg/dL — ABNORMAL HIGH (ref 70–99)
Potassium: 3 mmol/L — ABNORMAL LOW (ref 3.5–5.1)
Sodium: 150 mmol/L — ABNORMAL HIGH (ref 135–145)
Total Bilirubin: 18.9 mg/dL (ref 0.3–1.2)
Total Protein: 6 g/dL — ABNORMAL LOW (ref 6.5–8.1)

## 2019-08-22 LAB — TRIGLYCERIDES: Triglycerides: 213 mg/dL — ABNORMAL HIGH (ref <150)

## 2019-08-22 LAB — LACTATE DEHYDROGENASE: LDH: 446 U/L — ABNORMAL HIGH (ref 98–192)

## 2019-08-22 LAB — COOXEMETRY PANEL
Carboxyhemoglobin: 2 % — ABNORMAL HIGH (ref 0.5–1.5)
Methemoglobin: 1 % (ref 0.0–1.5)
O2 Saturation: 71.6 %
Total hemoglobin: 8.7 g/dL — ABNORMAL LOW (ref 12.0–16.0)

## 2019-08-22 LAB — AEROBIC/ANAEROBIC CULTURE W GRAM STAIN (SURGICAL/DEEP WOUND)
Culture: NO GROWTH
Gram Stain: NONE SEEN

## 2019-08-22 LAB — MAGNESIUM: Magnesium: 2.5 mg/dL — ABNORMAL HIGH (ref 1.7–2.4)

## 2019-08-22 LAB — APTT: aPTT: 80 seconds — ABNORMAL HIGH (ref 24–36)

## 2019-08-22 MED ORDER — POTASSIUM CHLORIDE 10 MEQ/50ML IV SOLN
10.0000 meq | INTRAVENOUS | Status: AC
  Administered 2019-08-22 (×6): 10 meq via INTRAVENOUS
  Filled 2019-08-22 (×6): qty 50

## 2019-08-22 MED ORDER — CHLORHEXIDINE GLUCONATE 0.12 % MT SOLN
OROMUCOSAL | Status: AC
  Filled 2019-08-22: qty 15

## 2019-08-22 MED ORDER — POTASSIUM CHLORIDE 20 MEQ/15ML (10%) PO SOLN
20.0000 meq | ORAL | Status: DC
  Administered 2019-08-22: 20 meq
  Filled 2019-08-22: qty 15

## 2019-08-22 MED ORDER — POTASSIUM CHLORIDE 10 MEQ/50ML IV SOLN
10.0000 meq | INTRAVENOUS | Status: AC
  Administered 2019-08-22 (×4): 10 meq via INTRAVENOUS
  Filled 2019-08-22 (×4): qty 50

## 2019-08-22 MED ORDER — FUROSEMIDE 10 MG/ML IJ SOLN
40.0000 mg | Freq: Once | INTRAMUSCULAR | Status: AC
  Administered 2019-08-22: 40 mg via INTRAVENOUS
  Filled 2019-08-22: qty 4

## 2019-08-22 MED ORDER — PIPERACILLIN-TAZOBACTAM 3.375 G IVPB
3.3750 g | Freq: Three times a day (TID) | INTRAVENOUS | Status: DC
  Administered 2019-08-22 – 2019-08-29 (×21): 3.375 g via INTRAVENOUS
  Filled 2019-08-22 (×22): qty 50

## 2019-08-22 NOTE — Progress Notes (Signed)
SLP Cancellation Note  Patient Details Name: Eshawn Coor MRN: [id number] DOB: [birth year]   Cancelled treatment:       Reason Eval/Treat Not Completed: Medical issues which prohibited therapy (Pt remains on vent but is weaning per RN. SLP will follow up on a subsequent date.)  Nini Cavan I. Vear Clock, MS, CCC-SLP Acute Rehabilitation Services Office number [phone] Pager [phone]  Scheryl Marten 08/22/2019, 9:09 AM

## 2019-08-22 NOTE — Progress Notes (Signed)
NAME:  Todd [name], MRN:  [id number], DOB:  Jul 18, 1955, LOS: 39 ADMISSION DATE:  07/26/2019, CONSULTATION DATE:  08/03/19 REFERRING MD:  Roxan Hockey, CHIEF COMPLAINT:  ECMO   Brief History   VA ECMO for post CABG Vtach arrest  History of present illness   Presented with worsening dyspnea 6/17 c/w CHF exacerbation Cath with severe triple vessel disease Underwent CABG 6/24 Vtach arrest 6/25 Chest opened bedside and cardiac massage initiated as well as multiple cardioversions, amiodarone, bicarb etc Brought to OR and cannulated for VA ECMO PCCM consulted to assist with management Comorbidities include DM, heavy smoking, COPD  Past Medical History  Depression Ischemic cardiomyopathy HTN [facility] Events   6/24 CABG 6/25 VA cannulation 6/28 decannulated and impella placed 6/29 bedside re-exploration; s/p decannulation. Placement of impella device originally at p8 but decreased to p4 2/2 suction events overnight up to p6. Echo completed and repositioned device. Remained on considerable support with 8 epi and 46 norepi vaso 0.05. continued chest tube output with large clots noted. Heparin thru device. Replacement products ongoing. 60% 8 peep 6/30: bedside mediastinal re-exploration and clean out of hematoma with tamponade and worsening hemodynamics. Pt had large volume transfusion. rebolused with amio 2/2 nsvt episode.  Weight up 48 pounds.  Started diuresis, Lasix drip started. 7/1 iatrogenic respiratory alkalosis, vent rate decreased. 7/2: No significant issues overnight remains on pressors and Impella not tolerating tube feeds with high gastric output awaiting core track placement 7/3: started trickle TF  7/5: Swab removed, CVL and PICC placed. 7/13 Trach  Consults:  Advanced heart failure, PCCM, TCTS  Procedures:  See below  Significant Diagnostic Tests:  6/22 spirometry with restrictive physiology, preserved FEV1/FVC CXR today with bilateral airspace disease,  cannulas in good position, deep left sulcus that was present yesterday, no clear PTX 6/18 echo: LVEF 74-25%, grade I diastolic dysfunction 7/7: US Abdomen cholelithiasis without cholecystitis. Mild wall thinking in setting of liver disease and ascites.   Micro Data:  COVID and HIV Neg 7/3 respiratory-few Candida tropicalis 7/5 blood>> 7/5 respiratory-few Candida tropicalis 7/7 urine-NG 7/7 blood>>NG 7/9 wound>> NG 7/12 BAL>>  Antimicrobials:  Vanc6/24>>7/13 Cefuroxime 6/24>> 6/25 merrem 6/26-> Anidulafungin 7/8->7/18 (planned) Interim history/subjective:  On vent/pressors, fevers back up again.  Objective   Blood pressure (!) 108/51, pulse 82, temperature 98.3 F (36.8 C), resp. rate (!) 24, height _0  (1.753 m), weight 72 kg, SpO2 100 %.    Vent Mode: PRVC FiO2 (%):  [40 %] 40 % Set Rate:  [20 bmp] 20 bmp Vt Set:  [420 mL] 420 mL PEEP:  [8 cmH20] 8 cmH20 Plateau Pressure:  [18 cmH20-23 cmH20] 19 cmH20   Intake/Output Summary (Last 24 hours) at 08/22/2019 0801 Last data filed at 08/22/2019 0700 Gross per 24 hour  Intake 2923.49 ml  Output 7715 ml  Net -4791.51 ml   Filed Weights   08/19/19 0735 08/20/19 0600 08/22/19 0500  Weight: 79.7 kg 78 kg 72 kg    Examination: GEN: ill appearing man in NAD HEENT: ETT in place, minimal secretions CV: RRR, ext warm PULM: scattered rhonci, triggering vent GI: Soft, hypoactive BS, rectal tube in place EXT: Anasarca, mild NEURO: Moving all 4 ext R>L, not following commands, less purposeful than yesterday PSYCH: cannot assess SKIN: Jaundiced  Bili worse Cr/Na stable WBC slightly up   Resolved Hospital Problem list   N/A  Assessment & Plan:   Cardiogenic/hemorrhagic shock status post CABG complicated by VT arrest, EF 20 to 25% with severe  LV dysfunction and moderate RV dysfunction.  S/p VA ECMO (decanulated 6/28). S/p impella placement (6/28> 7/9) -Has required reexploration x3 of the chest as well as massive volume  of blood transfusion  Atrial flutter VT HIT -Continue amiodarone infusion -Continue aspirin and statin -Anticoagulation with bivalirudin due to HIT -Pressor, inotropes, and lasix per primary  Acute hypoxic respiratory failure w/ need for mechanical ventilation; intubated Plan -PS trials as able - VAP prevention bundle  AKI 2/2 shock-stable -Electrolyte supplemented -Renally dose meds and avoid nephrotoxic meds -Strict I's/O - CRRT with discussion of pull daily  Liver shock--2/2 shock.  Hyperbilirubinemia Cirrhosis per 7/8 [place] -LFTs are stable, bili up - Question amiodarone related but a biliary stasis type injury is rare - Plan for HIDA today given sludge on [place]  T2DM -sugars look good, levemir and SSI   Fever curve- cultures negative to date. LE duplex neb. - ID consult today - Abx as ordered  Acute blood loss anemia-stable Thrombocytopenia--platelets improving since discontinuation of heparin.  -Continue bivalirudin -Continue to monitor CBCs  High risk malnutrition  -Continue Tube Feeds  Acute encephalopathy due to sedation- sedation off, EEG/CT head/ammonia do not explain - MRI brain  Best practice:  Diet: Tube feeds Pain/Anxiety/Delirium protocol (if indicated): fentanyl VAP protocol (if indicated): in place DVT prophylaxis: bival GI prophylaxis: PPI Glucose control: see above Mobility: BR Code Status: Full Family Communication: per primary Disposition:  ICU     The patient is critically ill with multiple organ systems failure and requires high complexity decision making for assessment and support, frequent evaluation and titration of therapies, application of advanced monitoring technologies and extensive interpretation of multiple databases. Critical Care Time devoted to patient care services described in this note independent of APP/resident time (if applicable)  is 38 minutes.   Erskine Emery MD [facility] 08/22/2019 8:01  AM Personal pager: [phone] If unanswered, please page CCM On-call: [phone]

## 2019-08-22 NOTE — Progress Notes (Signed)
5 Days Post-Op Procedure(s) (LRB): REMOVAL OF IMPELLA LEFT VENTRICULAR ASSIST DEVICE (N/A) TRANSESOPHAGEAL ECHOCARDIOGRAM (TEE) (N/A) Subjective: Unresponsive, jaundiced, diaphoretic  Objective: Vital signs in last 24 hours: Temp:  [98.3 F (36.8 C)-102.8 F (39.3 C)] 98.3 F (36.8 C) (07/14 0728) Pulse Rate:  [82-107] 82 (07/14 0715) Cardiac Rhythm: Normal sinus rhythm (07/14 0400) Resp:  [22-34] 24 (07/14 0715) BP: (103-108)/(51-61) 108/51 (07/14 0715) SpO2:  [92 %-100 %] 100 % (07/14 0715) Arterial Line BP: (74-120)/(42-60) 114/56 (07/14 0715) FiO2 (%):  [40 %] 40 % (07/14 0715) Weight:  [72 kg] 72 kg (07/14 0500)  Hemodynamic parameters for last 24 hours:    Intake/Output from previous day: 07/13 0701 - 07/14 0700 In: 3213.4 [I.V.:1654.5; NG/GT:1250; IV Piggyback:308.8] Out: 3845 [Urine:6395; Stool:500; Chest Tube:820] Intake/Output this shift: No intake/output data recorded.  General appearance: toxic Neurologic: some minimal spontaneous movement Heart: regular rate and rhythm Lungs: diminished breath sounds bases Abdomen: nondistended Wound: intact  Lab Results: Recent Labs    08/21/19 0406 08/22/19 0349  WBC 15.3* 17.9*  HGB 9.3* 8.6*  HCT 28.3* 27.3*  PLT 319 359   BMET:  Recent Labs    08/21/19 1700 08/22/19 0349  NA 151* 150*  K 3.6 3.0*  CL 96* 94*  CO2 39* 38*  GLUCOSE 168* 140*  BUN 70* 74*  CREATININE 1.69* 1.71*  CALCIUM 8.3* 8.2*    PT/INR:  Recent Labs    08/20/19 1202  LABPROT 18.7*  INR 1.6*   ABG    Component Value Date/Time   PHART 7.452 (H) 08/18/2019 1241   HCO3 37.0 (H) 08/18/2019 1241   TCO2 39 (H) 08/18/2019 1241   ACIDBASEDEF 1.0 08/08/2019 1115   O2SAT 71.6 08/22/2019 0349   CBG (last 3)  Recent Labs    08/21/19 2352 08/22/19 0356 08/22/19 0725  GLUCAP 119* 186* 117*    Assessment/Plan: S/P Procedure(s) (LRB): REMOVAL OF IMPELLA LEFT VENTRICULAR ASSIST DEVICE (N/A) TRANSESOPHAGEAL ECHOCARDIOGRAM  (TEE) (N/A) -Remains critically ill NEURO- EEG showed diffuse encephalopathy. agree with plan to reimage brain CV- co-ox 71 on milrinone and levophed  Rhythm stable on amiodarone RESP_ VDRF, trach in place, plan per CCM RENAL- creatinine up over past few days, stop lasix ID- febrile to 102.6 on [name] and meropenem.   reculture  HIDA to assess for possible acalculous cholecystitis  Central line out yesterday ENDO- CBG well controlled GI- jaundiced, bili up to 19, alk phos 860 Nutrition- on goal Tf HEME- HITT, PLT improved on bivalrudin  LOS: 27 days    Todd [name] 08/22/2019

## 2019-08-22 NOTE — Consult Note (Signed)
[facility]ious Disease       Reason for Consult: fever   Referring Physician: Dr. Aundra Dubin  Principal Problem:   Acute systolic heart failure ([place]) Active Problems:   CHF (congestive heart failure) (HCC)   Chronic pain syndrome   Mixed hyperlipidemia   Moderate recurrent major depression ([place])   Hypothyroidism   Type 2 diabetes mellitus with hyperglycemia (H[facility])   AKI (acute kidney injury) ([place])   Coronary artery disease involving native coronary artery of native heart with unstable angina pectoris ([place])   Coronary artery disease   Malnutrition of moderate degree   Pressure injury of skin   Acute on chronic respiratory failure ([place])   Acute respiratory failure ([place])   . artificial tears   Both Eyes Q8H  . aspirin  81 mg Per Tube Daily  . chlorhexidine gluconate (MEDLINE KIT)  15 mL Mouth Rinse BID  . Chlorhexidine Gluconate Cloth  6 each Topical Daily  . collagenase   Topical Daily  . docusate  200 mg Per Tube Daily  . feeding supplement (PROSource TF)  45 mL Per Tube BID  . fentaNYL (SUBLIMAZE) injection  50 mcg Intravenous Once  . free water  300 mL Per Tube Q6H  . furosemide  40 mg Intravenous Once  . insulin aspart  0-24 Units Subcutaneous Q4H  . insulin aspart  5 Units Subcutaneous Q4H  . insulin detemir  8 Units Subcutaneous BID  . ipratropium  0.5 mg Nebulization Q6H  . levalbuterol  1.25 mg Nebulization Q6H  . mouth rinse  15 mL Mouth Rinse 10 times per day  . pantoprazole (PROTONIX) IV  40 mg Intravenous QHS  . potassium chloride  40 mEq Per Tube BID  . sodium chloride flush  10-40 mL Intracatheter Q12H  . sodium chloride flush  10-40 mL Intracatheter Q12H  . sodium chloride flush  3 mL Intravenous Q12H    Recommendations: Piperacillin/tazobactam Stop meropenem and anidulafungin Remove/change lines when or if able to    Assessment: He has a fever for about 2 days and potential sources including the biliary system with his T bili, line  infection or bacteremia from another source.  Pneumonia possible but is weaning and no significant findings from his BAL. Drug fever from meropenem also possible cause. Sacral wound.  He has been on meropenem since 6/26 and intermittent doses of vancomycin and at this point, meropenem resistance is possible so will change therapy as above to a different class.    Antibiotics: Total antibiotics day 21  HPI: Todd [name] is a 64 y.o. male who initially presented 6/14 with pneumonia and heart failure, then underwent CABG on 6/24 following cath on 6/21 where he was noted to have 3 vessel disease.  Course was complicated by cardiac arrest on 6/24 with open chest due to bleeding and placed on ECMO, then bedside washout for tamponade followed by impella placement, then chest entered again 6/9, then chest closed and impella removed on 7/9.   He has remained on antibiotics most of his hospital stay and has had a persistently elevated WBC.  On 7/12 developed new fever up to 103.  He has been jaundiced with a total bilirubin of 18.9 today and is unresponsive having been off of fentanyl since yesterday am.  He has remained on pressor support.  He underwent tracheostomy 7/12.     Review of Systems:  Unable to be assessed due to mental status   Past Medical History:  Diagnosis Date  .  Arthritis   . CHF (congestive heart failure) ([place])   . Depression   . Diabetes mellitus without complication ([place])   . Hard of hearing   . Hyperlipidemia   . Hypertension     Social History   Tobacco Use  . Smoking status: Current Every Day Smoker    Packs/day: 2.50    Types: Cigarettes  . Smokeless tobacco: Never Used  Substance Use Topics  . Alcohol use: Not on file  . Drug use: Yes    Frequency: 7.0 times per week    Types: Marijuana    Family History  Problem Relation Age of Onset  . Heart disease Mother   . Hypertension Mother     Allergies  Allergen Reactions  . Empagliflozin     Other reaction(s):  diarrhea  . 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 Other     Other reaction(s): Unknown  . Simvastatin     Other reaction(s): diarrhea  . Sitagliptin     Other reaction(s): diarrhea/abd pain   history per chart review  Physical Exam: Constitutional: not alert  Vitals:   08/22/19 0915 08/22/19 0930  BP:    Pulse: 75 77  Resp: (!) 21 20  Temp:    SpO2: 100% 100%   EYES: icteric ENMT: + trach Cardiovascular: Cor RRR Respiratory: clear; GI: soft Musculoskeletal: no pedal edema noted Skin: negatives: no rash; jaundiced Neuro: no response  Lab Results  Component Value Date   WBC 17.9 (H) 08/22/2019   HGB 8.6 (L) 08/22/2019   HCT 27.3 (L) 08/22/2019   MCV 91.0 08/22/2019   PLT 359 08/22/2019    Lab Results  Component Value Date   CREATININE 1.71 (H) 08/22/2019   BUN 74 (H) 08/22/2019   NA 150 (H) 08/22/2019   K 3.0 (L) 08/22/2019   CL 94 (L) 08/22/2019   CO2 38 (H) 08/22/2019    Lab Results  Component Value Date   ALT 317 (H) 08/22/2019   AST 554 (H) 08/22/2019   ALKPHOS 860 (H) 08/22/2019     Microbiology: Recent Results (from the past 240 hour(s))  Culture, blood (Routine X 2) w Reflex to ID Panel     Status: None   Collection Time: 08/13/19  8:27 AM   Specimen: BLOOD  Result Value Ref Range Status   Specimen Description BLOOD BLOOD LEFT ARM  Final   Special Requests   Final    BOTTLES DRAWN AEROBIC AND ANAEROBIC Blood Culture adequate volume   Culture   Final    NO GROWTH 5 DAYS Performed at [facility], [address], [place], [place] [address]    Report Status 08/18/2019 FINAL  Final  Culture, blood (Routine X 2) w Reflex to ID Panel     Status: None   Collection Time: 08/13/19  8:31 AM   Specimen: BLOOD  Result Value Ref Range Status   Specimen Description BLOOD BLOOD LEFT HAND  Final   Special Requests   Final    BOTTLES DRAWN AEROBIC AND ANAEROBIC Blood Culture adequate volume   Culture   Final    NO  GROWTH 5 DAYS Performed at [facility], [place] [address], [place], [place] [address]    Report Status 08/18/2019 FINAL  Final  Culture, respiratory (non-expectorated)     Status: None   Collection Time: 08/13/19  4:12 PM   Specimen: Tracheal Aspirate; Respiratory  Result Value Ref Range Status  Specimen Description TRACHEAL ASPIRATE  Final   Special Requests NONE  Final   Gram Stain   Final    NO WBC SEEN FEW YEAST Performed at [facility], [place] [address], [place], [place] [address]    Culture FEW CANDIDA TROPICALIS  Final   Report Status 08/16/2019 FINAL  Final  Culture, Urine     Status: None   Collection Time: 08/15/19  6:13 PM   Specimen: Urine, Catheterized  Result Value Ref Range Status   Specimen Description URINE, CATHETERIZED  Final   Special Requests NONE  Final   Culture   Final    NO GROWTH Performed at [facility], [address], [place], [place] [address]    Report Status 08/17/2019 FINAL  Final  Culture, blood (routine x 2)     Status: None   Collection Time: 08/15/19  7:53 PM   Specimen: BLOOD RIGHT HAND  Result Value Ref Range Status   Specimen Description BLOOD RIGHT HAND  Final   Special Requests   Final    BOTTLES DRAWN AEROBIC AND ANAEROBIC Blood Culture adequate volume   Culture   Final    NO GROWTH 5 DAYS Performed at [facility], [place] [address] [place], [place] [address]    Report Status 08/20/2019 FINAL  Final  Culture, blood (routine x 2)     Status: None   Collection Time: 08/15/19  7:53 PM   Specimen: BLOOD RIGHT HAND  Result Value Ref Range Status   Specimen Description BLOOD RIGHT HAND  Final   Special Requests   Final    BOTTLES DRAWN AEROBIC AND ANAEROBIC Blood Culture adequate volume   Culture   Final    NO GROWTH 5 DAYS Performed at [facility], [place] [address], [place], [place] [address]    Report Status 08/20/2019 FINAL  Final  Aerobic/Anaerobic Culture (surgical/deep wound)     Status: None  (Preliminary result)   Collection Time: 08/17/19  2:32 PM   Specimen: Wound  Result Value Ref Range Status   Specimen Description WOUND  Final   Special Requests NONE  Final   Gram Stain NO WBC SEEN NO ORGANISMS SEEN   Final   Culture   Final    NO GROWTH 4 DAYS NO ANAEROBES ISOLATED; CULTURE IN PROGRESS FOR 5 DAYS Performed at [facility], [place] [address], [place], [place] [address]    Report Status PENDING  Incomplete  Culture, fungus without smear     Status: None (Preliminary result)   Collection Time: 08/20/19  2:00 PM   Specimen: Bronchoalveolar Lavage; Other  Result Value Ref Range Status   Specimen Description BRONCHIAL ALVEOLAR LAVAGE  Final   Special Requests Normal  Final   Culture   Final    NO FUNGUS ISOLATED AFTER 1 DAY Performed at [facility], [address] [place], [place] [address]    Report Status PENDING  Incomplete  Culture, bal-quantitative     Status: None (Preliminary result)   Collection Time: 08/20/19  2:00 PM   Specimen: Bronchoalveolar Lavage; Respiratory  Result Value Ref Range Status   Specimen Description BRONCHIAL ALVEOLAR LAVAGE  Final   Special Requests NONE  Final   Gram Stain   Final    FEW WBC PRESENT, PREDOMINANTLY PMN NO ORGANISMS SEEN    Culture   Final    NO GROWTH < 24 HOURS Performed at [facility], [address] [place]  [address] [place], [place] [address]    Report Status PENDING  Incomplete    Thayer Headings, [place] for Infectious Disease [place] [email or web address] 08/22/2019, 10:32 AM

## 2019-08-22 NOTE — Progress Notes (Signed)
ANTICOAGULATION CONSULT NOTE - Follow Up Consult  Pharmacy Consult for heparin Indication: ECMO > impella  Labs: Recent Labs    08/20/19 0423 08/20/19 0423 08/20/19 1202 08/20/19 1507 08/21/19 0406 08/21/19 1700 08/22/19 0349  HGB 10.0*   < >  --   --  9.3*  --  8.6*  HCT 29.4*  --   --   --  28.3*  --  27.3*  PLT 283  --   --   --  319  --  359  APTT 68*  --   --   --  70*  --  80*  LABPROT  --   --  18.7*  --   --   --   --   INR  --   --  1.6*  --   --   --   --   CREATININE 1.35*   < > 1.45*   < > 1.57* 1.69* 1.71*   < > = values in this interval not displayed.    Assessment: 64 yo male s/p ECMO and Impella. Chest closed 7/2. CT drainage decreased.    HIT ab sent a few days ago and returned as 2.6 - positive, switched from heparin to bivalirudin. SRA positive, confirmed for HIT.  APTT this morning is 80 on bivalirudin@0 .055 mg/kg/hr. Was targeting lower goal due to risk of bleeding, but will increase goal to normal range given stable CBC and no bleeding. Hgb 8.6, plt 259. LDH 446. No s/sx of bleeding or infusion issues.   Goals of Therapy: APTT 50-85 s  Plan: Continue bivalirudin at 0.055 mg/kg/hr Daily aPTT. Monitor daily aPTT, CBC, and for s/sx of bleeding  Tama Headings, PharmD PGY2 Cardiology Pharmacy Resident Phone: [phone] 08/22/2019  12:11 PM  Please check [email or web address] for unit-specific pharmacy phone numbers.

## 2019-08-22 NOTE — Progress Notes (Signed)
      [address]       [place] [address]             [phone]      Unresponsive presently Apparently was tracking with eyes and responding a little earlier BP (!) 112/56   Pulse 74   Temp 98.3 F (36.8 C)   Resp (!) 24   Ht 5\' 9"  (1.753 m)   Wt 72 kg   SpO2 100%   BMI 23.44 kg/m   Intake/Output Summary (Last 24 hours) at 08/22/2019 1913 Last data filed at 08/22/2019 1854 Gross per 24 hour  Intake 4052.11 ml  Output 5295 ml  Net -1242.89 ml   K= 3.2- being supplemented No MR due to pacing wires- will consider removing them in AM Hold off on MR for now  [place] C. [place], MD [facility] [phone]

## 2019-08-22 NOTE — Progress Notes (Signed)
Patient ID: Todd [name], male   DOB: 07-28-1955, 64 y.o.   MRN: [id number]     Advanced Heart Failure Rounding Note  PCP-Cardiologist: No primary care provider on file.   Subjective:    08/02/19 CABG 08/03/19 VF arrest. Chest open at bedside 08/03/19 Back to OR for ECMO cannulation 08/03/19 Washout out for tamponade 08/04/19 Repeat bedside washout for tamponade 08/06/19 To OR for decannulation and Impella 5.5 08/07/19 Underwent emergent bedside chest exploration at bedside for tamponade  08/10/19 Chest closed 08/16/19 HIT positive, bivalirudin begun 08/17/19 Impella removed 08/20/19 Tracheostomy  Epinephrine off, milrinone 0.125, NE 27, vasopressin 0.01.  MAP stable.  Co-ox 72%.   In NSR on amiodarone at 30, no further NSVT.   Lasix gtt currently at 4 mg/hr, I/Os markedly negative with good UOP. CVP 7.     Tm 102.8, WBCs 26=>21=>23=>19=>14=>15=>15 => 17.9.  He is on meropenem and anidulafungin. Few Candida in trach aspirate. Venous dopplers negative for DVT.   Ileus, improving.  Now getting tube feeds via post-pyloric tube.  Off TPN.   Creatinine 1.46 => 1.53 => 1.35 => 1.57 => 1.71.  Na 150 this morning, getting free water boluses.     Patient not following commands with sedation wean, CT head negative 7/11. EEG with evidence for diffuse encephalopathy. NH3 38.   Jaundiced with consistently elevated bilirubin (direct), tbili up to 18.9.  Abdominal US with gallbladder sludge, no definite cholecystitis.    Objective:   Weight Range: 72 kg Body mass index is 23.44 kg/m.   Vital Signs:   Temp:  [98.3 F (36.8 C)-102.8 F (39.3 C)] 98.3 F (36.8 C) (07/14 0728) Pulse Rate:  [82-107] 82 (07/14 0715) Resp:  [22-34] 24 (07/14 0715) BP: (103-108)/(51-61) 108/51 (07/14 0715) SpO2:  [92 %-100 %] 100 % (07/14 0715) Arterial Line BP: (74-120)/(42-60) 114/56 (07/14 0715) FiO2 (%):  [40 %] 40 % (07/14 0715) Weight:  [72 kg] 72 kg (07/14 0500) Last BM Date: 08/21/19  Weight change: Filed  Weights   08/19/19 0735 08/20/19 0600 08/22/19 0500  Weight: 79.7 kg 78 kg 72 kg    Intake/Output:   Intake/Output Summary (Last 24 hours) at 08/22/2019 0755 Last data filed at 08/22/2019 0700 Gross per 24 hour  Intake 3213.35 ml  Output 7715 ml  Net -4501.65 ml      Physical Exam    General: NAD Neck: No JVD, no thyromegaly or thyroid nodule.  Lungs: Decreased at bases. CV: Nondisplaced PMI.  Heart regular S1/S2, no S3/S4, no murmur.  No peripheral edema.   Abdomen: Soft, no hepatosplenomegaly, no distention.  Skin: Jaundiced  Neurologic: Not responsive Extremities: No clubbing or cyanosis.  HEENT: Normal.    Telemetry   NSR 80s Personally reviewed   Labs    CBC Recent Labs    08/20/19 0423 08/20/19 0423 08/21/19 0406 08/22/19 0349  WBC 14.9*   < > 15.3* 17.9*  NEUTROABS 12.8*  --   --   --   HGB 10.0*   < > 9.3* 8.6*  HCT 29.4*   < > 28.3* 27.3*  MCV 86.2   < > 89.3 91.0  PLT 283   < > 319 359   < > = values in this interval not displayed.   Basic Metabolic Panel Recent Labs    08/20/19 1202 08/20/19 1202 08/20/19 1507 08/21/19 0406 08/21/19 1700 08/22/19 0349  NA 150*   < > 150*   < > 151* 150*  K 4.0   < >  3.6   < > 3.6 3.0*  CL 102   < > 99   < > 96* 94*  CO2 35*   < > 37*   < > 39* 38*  GLUCOSE 112*   < > 110*   < > 168* 140*  BUN 58*   < > 62*   < > 70* 74*  CREATININE 1.45*   < > 1.50*   < > 1.69* 1.71*  CALCIUM 8.0*   < > 8.2*   < > 8.3* 8.2*  MG 2.0  --   --   --   --  2.5*  PHOS 4.3   < > 5.6*  --  4.7*  --    < > = values in this interval not displayed.   Liver Function Tests Recent Labs    08/21/19 0406 08/21/19 0406 08/21/19 1700 08/22/19 0349  AST 597*  --   --  554*  ALT 353*  --   --  317*  ALKPHOS 925*  --   --  860*  BILITOT 16.1*  --   --  18.9*  PROT 5.8*  --   --  6.0*  ALBUMIN 2.2*   < > 2.3* 2.4*   < > = values in this interval not displayed.   No results for input(s): LIPASE, AMYLASE in the last 72  hours. Cardiac Enzymes No results for input(s): CKTOTAL, CKMB, CKMBINDEX, TROPONINI in the last 72 hours.  BNP: BNP (last 3 results) Recent Labs    07/26/19 1236  BNP 2,568.2*    ProBNP (last 3 results) No results for input(s): PROBNP in the last 8760 hours.   D-Dimer No results for input(s): DDIMER in the last 72 hours. Hemoglobin A1C No results for input(s): HGBA1C in the last 72 hours. Fasting Lipid Panel Recent Labs    08/22/19 0349  TRIG 213*   Thyroid Function Tests No results for input(s): TSH, T4TOTAL, T3FREE, THYROIDAB in the last 72 hours.  Invalid input(s): FREET3  Other results:   Imaging    EEG adult  Result Date: 08/21/2019 Lora Havens, MD     08/21/2019 10:37 AM Patient Name: Orvan Papadakis MRN: [id number] Epilepsy Attending: Lora Havens Referring Physician/Provider: Dr Ina Homes Date: 08/21/2019 Duration: 25.38 mins Patient history: 64yo M who presented with cardiogenic/hemorrhagic shock status post CABG complicated by VT arrest , continues to be encephalopathic. EEG to evaluate for seizure Level of alertness: lethargic AEDs during EEG study: None Technical aspects: This EEG study was done with scalp electrodes positioned according to the 10-20 International system of electrode placement. Electrical activity was acquired at a sampling rate of '500Hz'  and reviewed with a high frequency filter of '70Hz'  and a low frequency filter of '1Hz' . EEG data were recorded continuously and digitally stored. Description: No posterior dominant rhythm was seen. EEG showed continuous generalized polymorphic mixed frequencies with predominantly 5-9 Hz theta-alpha activity as well as intermittent 2-'3hz'  delta slowing.  Hyperventilation and photic stimulation were not performed.   ABNORMALITY -Continuous slow, generalized IMPRESSION: This study is suggestive of moderate to severe diffuse encephalopathy, nonspecific etiology. No seizures or epileptiform discharges were seen  throughout the recording. Priyanka O Yadav   [place] EKG SITE RITE  Result Date: 08/21/2019 If Site Rite image not attached, placement could not be confirmed due to current cardiac rhythm.    Medications:     Scheduled Medications: . artificial tears   Both Eyes Q8H  . aspirin  81 mg Per Tube Daily  .  chlorhexidine gluconate (MEDLINE KIT)  15 mL Mouth Rinse BID  . Chlorhexidine Gluconate Cloth  6 each Topical Daily  . collagenase   Topical Daily  . docusate  200 mg Per Tube Daily  . feeding supplement (PROSource TF)  45 mL Per Tube BID  . fentaNYL (SUBLIMAZE) injection  50 mcg Intravenous Once  . free water  300 mL Per Tube Q6H  . insulin aspart  0-24 Units Subcutaneous Q4H  . insulin aspart  5 Units Subcutaneous Q4H  . insulin detemir  8 Units Subcutaneous BID  . ipratropium  0.5 mg Nebulization Q6H  . levalbuterol  1.25 mg Nebulization Q6H  . mouth rinse  15 mL Mouth Rinse 10 times per day  . pantoprazole (PROTONIX) IV  40 mg Intravenous QHS  . potassium chloride  40 mEq Per Tube BID  . sodium chloride flush  10-40 mL Intracatheter Q12H  . sodium chloride flush  10-40 mL Intracatheter Q12H  . sodium chloride flush  3 mL Intravenous Q12H    Infusions: . sodium chloride 250 mL (08/13/19 1412)  . sodium chloride Stopped (08/13/19 1631)  . amiodarone Stopped (08/22/19 0147)  . anidulafungin Stopped (08/21/19 0947)  . bivalirudin (ANGIOMAX) infusion 0.5 mg/mL (Non-ACS indications) 0.055 mg/kg/hr (08/22/19 0700)  . epinephrine Stopped (08/19/19 1251)  . feeding supplement (PIVOT 1.5 CAL) 60 mL/hr at 08/21/19 2000  . fentaNYL infusion INTRAVENOUS Stopped (08/21/19 0610)  . furosemide (LASIX) infusion 4 mg/hr (08/22/19 0700)  . lactated ringers 20 mL/hr at 08/10/19 1705  . lactated ringers 10 mL/hr at 08/18/19 0054  . meropenem (MERREM) IV 1 g (08/22/19 0506)  . milrinone 0.125 mcg/kg/min (08/22/19 0700)  . norepinephrine (LEVOPHED) Adult infusion 27 mcg/min (08/22/19 0700)  .  potassium chloride    . vasopressin 0.01 Units/min (08/22/19 0700)    PRN Medications: sodium chloride, sodium chloride, dextrose, fentaNYL, metoprolol tartrate, midazolam, morphine injection, ondansetron (ZOFRAN) IV, sodium chloride flush, sodium chloride flush, sodium chloride flush   Assessment/Plan   1. Shock: At this point, think mixed cardiogenic/septic.  Echo with EF 20-25%, moderate RV dysfunction.  VA ECMO post-VF arrest, cannulated 6/25.  Stable s/p return to OR for mediastinal re-exploration due to bleeding on 6/25.  Decannulated from [place] ECMO on 08/06/19. Impella 5.5 placed. Underwent emergent chest re-exploration on 6/29 for tamponade and clot removal. Chest closed 7/2.  Impella 5.5 removed 7/9.  This morning, he is on NE 27, milrinone 0.125, vasopressin 0.01.  Suspect component of septic/vasodilatory shock with prior fever, elevated WBCs.  Co-ox 72%, CVP 7. High UOP with Lasix gtt 4 mg/hr, I/Os negative with weight down significantly.  Creatinine up to 1.7. - Continue pressor wean.   - Stop Lasix for now, will give a dose of 40 mg IV in the evening to try to keep around even.    2. 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  Prolonged VF arrest on 6/25. Chest closed 7/2.  - ASA  - Hold Crestor with elevated LFTs.  3. Cardiac arrest: VF arrest with prolonged code. Still with episodes of VT/NSVT in setting of low K (difficult to effectively replace).  - K improved, will replace today.   - Continue amiodarone 30 mg/hr.   - Eventual evaluation for ICD or lifevest at d/c - Keep K > 4.0 Mg > 2.0 4. Acute hypoxemic respiratory failure: In setting of cardiac arrest.  H/o COPD.  Bibasilar infiltrates, possible HCAP.  Now with tracheostomy.  -  Getting meropenem/[name] for possible PNA, complete 10 days [name]. 5. Acute blood loss anemia: Now s/p mediastinal re-exploration x 3. Hgb stable today.  CT drainage has slowed.   - Now on bivalirudin gtt. 6.  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  Platelets have trended back up.  7. ID: Suspect HCAP, on meropenem/anidulafungin, now off vancomycin.  WBCs 18, still with fever up to 102.8. Venous dopplers negative for DVT. PCT 1.99 => 2.35.  Suspect component of septic shock. Cultures negative so far except few Candida in tracheal aspirate.  ?Drug fever, ?cholecystitis (abdominal US not diagnostic of cholecystitis).  - Will ask ID to see today.  8. Elevated bilirubin: Mostly direct = likely cholestatic/shock liver/RV failure predominantly.   Abdominal US with evidence for cirrhosis, sludge in GB but no definite acute cholecystitis. Tbili continues to rise, up to 18.9 today. NH3 was not significantly elevated.  - HIDA scan   9. Ileus: Improving.  Now getting tube feeds and off TPN. 10. Atrial fibrillation/flutter: Paroxysmal. Back in NSR today.     - Continue amiodarone gtt.  - bivalirudin gtt.  11. Neuro: Does not respond to commands. CT head negative on 7/11. EEG with e/o diffuse encephalopathy.  NH3 38.  - Head MRI today.   12. Hypernatremia: Free water boluses 300 cc q6 hrs, also stopping Lasix gtt.    CRITICAL CARE Performed by: Loralie Champagne  Total critical care time: 40 minutes at bedside  Critical care time was exclusive of separately billable procedures and treating other patients.  Critical care was necessary to treat or prevent imminent or life-threatening deterioration.  Critical care was time spent personally by me on the following activities: development of treatment plan with patient and/or surrogate as well as nursing, discussions with consultants, evaluation of patient's response to treatment, examination of patient, obtaining history from patient or surrogate, ordering and performing treatments and interventions, ordering and review of laboratory studies, ordering and review of radiographic studies, pulse oximetry and re-evaluation of patient's condition.   Length of  Stay: [place], MD  08/22/2019, 7:55 AM  Advanced Heart Failure Team Pager [phone] (M-F; 7a - 4p)  Please contact [facility] for night-coverage after hours (4p -7a ) and weekends on [email or web address]

## 2019-08-23 ENCOUNTER — Inpatient Hospital Stay (HOSPITAL_COMMUNITY): Payer: Medicare Other

## 2019-08-23 LAB — CBC
HCT: 30.6 % — ABNORMAL LOW (ref 39.0–52.0)
HCT: 29.4 % — ABNORMAL LOW (ref 39.0–52.0)
Hemoglobin: 9.3 g/dL — ABNORMAL LOW (ref 13.0–17.0)
Hemoglobin: 8.8 g/dL — ABNORMAL LOW (ref 13.0–17.0)
MCH: 28.3 pg (ref 26.0–34.0)
MCH: 28.3 pg (ref 26.0–34.0)
MCHC: 30.4 g/dL (ref 30.0–36.0)
MCHC: 29.9 g/dL — ABNORMAL LOW (ref 30.0–36.0)
MCV: 93 fL (ref 80.0–100.0)
MCV: 94.5 fL (ref 80.0–100.0)
Platelets: 376 10*3/uL (ref 150–400)
Platelets: 419 10*3/uL — ABNORMAL HIGH (ref 150–400)
RBC: 3.29 MIL/uL — ABNORMAL LOW (ref 4.22–5.81)
RBC: 3.11 MIL/uL — ABNORMAL LOW (ref 4.22–5.81)
RDW: 25.3 % — ABNORMAL HIGH (ref 11.5–15.5)
RDW: 26.2 % — ABNORMAL HIGH (ref 11.5–15.5)
WBC: 18.8 10*3/uL — ABNORMAL HIGH (ref 4.0–10.5)
WBC: 20.5 10*3/uL — ABNORMAL HIGH (ref 4.0–10.5)
nRBC: 0.5 % — ABNORMAL HIGH (ref 0.0–0.2)
nRBC: 0.7 % — ABNORMAL HIGH (ref 0.0–0.2)

## 2019-08-23 LAB — COMPREHENSIVE METABOLIC PANEL
ALT: 267 U/L — ABNORMAL HIGH (ref 0–44)
AST: 359 U/L — ABNORMAL HIGH (ref 15–41)
Albumin: 1.9 g/dL — ABNORMAL LOW (ref 3.5–5.0)
Alkaline Phosphatase: 675 U/L — ABNORMAL HIGH (ref 38–126)
Anion gap: 12 (ref 5–15)
BUN: 76 mg/dL — ABNORMAL HIGH (ref 8–23)
CO2: 41 mmol/L — ABNORMAL HIGH (ref 22–32)
Calcium: 7.9 mg/dL — ABNORMAL LOW (ref 8.9–10.3)
Chloride: 99 mmol/L (ref 98–111)
Creatinine, Ser: 1.4 mg/dL — ABNORMAL HIGH (ref 0.61–1.24)
GFR calc Af Amer: >60 mL/min (ref >60)
GFR calc non Af Amer: 52 mL/min — ABNORMAL LOW (ref >60)
Glucose, Bld: 104 mg/dL — ABNORMAL HIGH (ref 70–99)
Potassium: 3.1 mmol/L — ABNORMAL LOW (ref 3.5–5.1)
Sodium: 152 mmol/L — ABNORMAL HIGH (ref 135–145)
Total Bilirubin: 14.9 mg/dL — ABNORMAL HIGH (ref 0.3–1.2)
Total Protein: 5.7 g/dL — ABNORMAL LOW (ref 6.5–8.1)

## 2019-08-23 LAB — RENAL FUNCTION PANEL
Albumin: 2.5 g/dL — ABNORMAL LOW (ref 3.5–5.0)
Anion gap: 17 — ABNORMAL HIGH (ref 5–15)
BUN: 82 mg/dL — ABNORMAL HIGH (ref 8–23)
CO2: 35 mmol/L — ABNORMAL HIGH (ref 22–32)
Calcium: 8.5 mg/dL — ABNORMAL LOW (ref 8.9–10.3)
Chloride: 98 mmol/L (ref 98–111)
Creatinine, Ser: 1.62 mg/dL — ABNORMAL HIGH (ref 0.61–1.24)
GFR calc Af Amer: 51 mL/min — ABNORMAL LOW (ref >60)
GFR calc non Af Amer: 44 mL/min — ABNORMAL LOW (ref >60)
Glucose, Bld: 180 mg/dL — ABNORMAL HIGH (ref 70–99)
Phosphorus: 6.9 mg/dL — ABNORMAL HIGH (ref 2.5–4.6)
Potassium: 4.2 mmol/L (ref 3.5–5.1)
Sodium: 150 mmol/L — ABNORMAL HIGH (ref 135–145)

## 2019-08-23 LAB — POCT I-STAT 7, (LYTES, BLD GAS, ICA,H+H)
Acid-Base Excess: 17 mmol/L — ABNORMAL HIGH (ref 0.0–2.0)
Bicarbonate: 41.8 mmol/L — ABNORMAL HIGH (ref 20.0–28.0)
Calcium, Ion: 1.02 mmol/L — ABNORMAL LOW (ref 1.15–1.40)
HCT: 29 % — ABNORMAL LOW (ref 39.0–52.0)
Hemoglobin: 9.9 g/dL — ABNORMAL LOW (ref 13.0–17.0)
O2 Saturation: 98 %
Patient temperature: 38.7
Potassium: 3.8 mmol/L (ref 3.5–5.1)
Sodium: 150 mmol/L — ABNORMAL HIGH (ref 135–145)
TCO2: 43 mmol/L — ABNORMAL HIGH (ref 22–32)
pCO2 arterial: 52 mmHg — ABNORMAL HIGH (ref 32.0–48.0)
pH, Arterial: 7.519 — ABNORMAL HIGH (ref 7.350–7.450)
pO2, Arterial: 108 mmHg (ref 83.0–108.0)

## 2019-08-23 LAB — GLUCOSE, CAPILLARY
Glucose-Capillary: 103 mg/dL — ABNORMAL HIGH (ref 70–99)
Glucose-Capillary: 128 mg/dL — ABNORMAL HIGH (ref 70–99)
Glucose-Capillary: 140 mg/dL — ABNORMAL HIGH (ref 70–99)
Glucose-Capillary: 159 mg/dL — ABNORMAL HIGH (ref 70–99)
Glucose-Capillary: 207 mg/dL — ABNORMAL HIGH (ref 70–99)
Glucose-Capillary: 46 mg/dL — ABNORMAL LOW (ref 70–99)
Glucose-Capillary: 94 mg/dL (ref 70–99)

## 2019-08-23 LAB — TRIGLYCERIDES: Triglycerides: 200 mg/dL — ABNORMAL HIGH (ref <150)

## 2019-08-23 LAB — COOXEMETRY PANEL
Carboxyhemoglobin: 1.5 % (ref 0.5–1.5)
Methemoglobin: 1.1 % (ref 0.0–1.5)
O2 Saturation: 55.5 %
Total hemoglobin: 10.2 g/dL — ABNORMAL LOW (ref 12.0–16.0)

## 2019-08-23 LAB — LACTATE DEHYDROGENASE: LDH: 378 U/L — ABNORMAL HIGH (ref 98–192)

## 2019-08-23 LAB — APTT
aPTT: 157 seconds — ABNORMAL HIGH (ref 24–36)
aPTT: 88 seconds — ABNORMAL HIGH (ref 24–36)

## 2019-08-23 LAB — MAGNESIUM: Magnesium: 2.5 mg/dL — ABNORMAL HIGH (ref 1.7–2.4)

## 2019-08-23 LAB — PHOSPHORUS: Phosphorus: 5.1 mg/dL — ABNORMAL HIGH (ref 2.5–4.6)

## 2019-08-23 MED ORDER — CALCIUM CHLORIDE 10 % IV SOLN
INTRAVENOUS | Status: AC
  Filled 2019-08-23: qty 10

## 2019-08-23 MED ORDER — MORPHINE SULFATE (PF) 4 MG/ML IV SOLN: 3.0000 mg | Freq: Once | INTRAVENOUS | Status: AC

## 2019-08-23 MED ORDER — ACETAZOLAMIDE 250 MG PO TABS
500.0000 mg | ORAL_TABLET | Freq: Two times a day (BID) | ORAL | Status: AC
  Administered 2019-08-23 (×2): 500 mg
  Filled 2019-08-23 (×2): qty 2

## 2019-08-23 MED ORDER — IOHEXOL 300 MG/ML  SOLN
80.0000 mL | Freq: Once | INTRAMUSCULAR | Status: AC | PRN
  Administered 2019-08-23: 80 mL via INTRAVENOUS

## 2019-08-23 MED ORDER — AMIODARONE HCL IN DEXTROSE 360-4.14 MG/200ML-% IV SOLN
30.0000 mg/h | INTRAVENOUS | Status: DC
  Administered 2019-08-24 – 2019-08-25 (×3): 30 mg/h via INTRAVENOUS
  Filled 2019-08-23 (×5): qty 200

## 2019-08-23 MED ORDER — METOLAZONE 5 MG PO TABS: 5.0000 mg | ORAL_TABLET | Freq: Once | ORAL | Status: DC

## 2019-08-23 MED ORDER — POTASSIUM CHLORIDE 10 MEQ/50ML IV SOLN
10.0000 meq | INTRAVENOUS | Status: AC
  Administered 2019-08-23 (×6): 10 meq via INTRAVENOUS
  Filled 2019-08-23 (×6): qty 50

## 2019-08-23 MED ORDER — ALBUMIN HUMAN 5 % IV SOLN
12.5000 g | Freq: Once | INTRAVENOUS | Status: AC
  Administered 2019-08-23: 12.5 g via INTRAVENOUS

## 2019-08-23 MED ORDER — TECHNETIUM TC 99M MEBROFENIN IV KIT
4.9000 | PACK | Freq: Once | INTRAVENOUS | Status: AC | PRN
  Administered 2019-08-23: 4.9 via INTRAVENOUS

## 2019-08-23 MED ORDER — DEXTROSE 5 % IV SOLN
500.0000 mg | Freq: Once | INTRAVENOUS | Status: AC
  Administered 2019-08-23: 500 mg via INTRAVENOUS
  Filled 2019-08-23: qty 500

## 2019-08-23 MED ORDER — FUROSEMIDE 10 MG/ML IJ SOLN
40.0000 mg | Freq: Two times a day (BID) | INTRAMUSCULAR | Status: DC
  Administered 2019-08-23: 40 mg via INTRAVENOUS
  Filled 2019-08-23: qty 4

## 2019-08-23 MED ORDER — DEXTROSE 5 % IV SOLN: 500.0000 mg | Freq: Two times a day (BID) | INTRAVENOUS | Status: DC

## 2019-08-23 MED ORDER — ALBUMIN HUMAN 5 % IV SOLN
INTRAVENOUS | Status: AC
  Filled 2019-08-23: qty 250

## 2019-08-23 MED ORDER — SODIUM CHLORIDE 0.9 % IV SOLN
0.0550 mg/kg/h | INTRAVENOUS | Status: DC
  Filled 2019-08-23: qty 250

## 2019-08-23 MED ORDER — MORPHINE SULFATE (PF) 4 MG/ML IV SOLN
INTRAVENOUS | Status: AC
  Administered 2019-08-23: 3 mg via INTRAVENOUS
  Filled 2019-08-23: qty 1

## 2019-08-23 MED ORDER — ALBUMIN HUMAN 25 % IV SOLN
25.0000 g | Freq: Four times a day (QID) | INTRAVENOUS | Status: AC
  Administered 2019-08-23 – 2019-08-24 (×4): 25 g via INTRAVENOUS
  Filled 2019-08-23 (×4): qty 100

## 2019-08-23 MED ORDER — AMIODARONE HCL IN DEXTROSE 360-4.14 MG/200ML-% IV SOLN
60.0000 mg/h | INTRAVENOUS | Status: AC
  Administered 2019-08-23: 60 mg/h via INTRAVENOUS

## 2019-08-23 MED ORDER — CALCIUM CHLORIDE 10 % IV SOLN
1.0000 g | Freq: Once | INTRAVENOUS | Status: AC
  Administered 2019-08-23: 1 g via INTRAVENOUS

## 2019-08-23 NOTE — Plan of Care (Signed)
°  Problem: Coping: Goal: Level of anxiety will decrease Outcome: Progressing   Problem: Pain Managment: Goal: General experience of comfort will improve Outcome: Progressing   Problem: Safety: Goal: Ability to remain free from injury will improve Outcome: Progressing   Problem: Urinary Elimination: Goal: Ability to achieve and maintain adequate renal perfusion and functioning will improve Outcome: Progressing   Problem: Education: Goal: Knowledge of General Education information will improve Description: Including pain rating scale, medication(s)/side effects and non-pharmacologic comfort measures Outcome: Not Progressing   Problem: Health Behavior/Discharge Planning: Goal: Ability to manage health-related needs will improve Outcome: Not Progressing   Problem: Clinical Measurements: Goal: Ability to maintain clinical measurements within normal limits will improve Outcome: Not Progressing Goal: Will remain free from infection Outcome: Not Progressing Goal: Diagnostic test results will improve Outcome: Not Progressing Goal: Respiratory complications will improve Outcome: Not Progressing Goal: Cardiovascular complication will be avoided Outcome: Not Progressing   Problem: Activity: Goal: Risk for activity intolerance will decrease Outcome: Not Progressing   Problem: Nutrition: Goal: Adequate nutrition will be maintained Outcome: Not Progressing   Problem: Elimination: Goal: Will not experience complications related to bowel motility Outcome: Not Progressing Goal: Will not experience complications related to urinary retention Outcome: Not Progressing   Problem: Skin Integrity: Goal: Risk for impaired skin integrity will decrease Outcome: Not Progressing   Problem: Education: Goal: Ability to demonstrate management of disease process will improve Outcome: Not Progressing Goal: Ability to verbalize understanding of medication therapies will improve Outcome: Not  Progressing Goal: Individualized Educational Video(s) Outcome: Not Progressing   Problem: Activity: Goal: Capacity to carry out activities will improve Outcome: Not Progressing   Problem: Cardiac: Goal: Ability to achieve and maintain adequate cardiopulmonary perfusion will improve Outcome: Not Progressing   Problem: Education: Goal: Will demonstrate proper wound care and an understanding of methods to prevent future damage Outcome: Not Progressing Goal: Knowledge of disease or condition will improve Outcome: Not Progressing Goal: Knowledge of the prescribed therapeutic regimen will improve Outcome: Not Progressing Goal: Individualized Educational Video(s) Outcome: Not Progressing   Problem: Activity: Goal: Risk for activity intolerance will decrease Outcome: Not Progressing   Problem: Cardiac: Goal: Will achieve and/or maintain hemodynamic stability Outcome: Not Progressing   Problem: Clinical Measurements: Goal: Postoperative complications will be avoided or minimized Outcome: Not Progressing   Problem: Respiratory: Goal: Respiratory status will improve Outcome: Not Progressing   Problem: Skin Integrity: Goal: Wound healing without signs and symptoms of infection Outcome: Not Progressing Goal: Risk for impaired skin integrity will decrease Outcome: Not Progressing

## 2019-08-23 NOTE — Progress Notes (Signed)
6 Days Post-Op Procedure(s) (LRB): REMOVAL OF IMPELLA LEFT VENTRICULAR ASSIST DEVICE (N/A) TRANSESOPHAGEAL ECHOCARDIOGRAM (TEE) (N/A) Subjective: Just back from HIDA  Objective: Vital signs in last 24 hours: Temp:  [97.1 F (36.2 C)-98.8 F (37.1 C)] 98.1 F (36.7 C) (07/15 0800) Pulse Rate:  [66-87] 86 (07/15 0930) Cardiac Rhythm: Normal sinus rhythm (07/15 0800) Resp:  [17-31] 21 (07/15 0717) BP: (86-127)/(48-56) 86/48 (07/15 0717) SpO2:  [97 %-100 %] 100 % (07/15 0930) Arterial Line BP: (80-130)/(43-74) 113/52 (07/15 0930) FiO2 (%):  [40 %] 40 % (07/15 0717) Weight:  [71.7 kg] 71.7 kg (07/15 0330)  Hemodynamic parameters for last 24 hours: CVP:  [9 mmHg-15 mmHg] 9 mmHg  Intake/Output from previous day: 07/14 0701 - 07/15 0700 In: 3806.7 [I.V.:967.9; NG/GT:2089; IV Piggyback:749.8] Out: 4385 [Urine:2805; Stool:700; Chest Tube:880] Intake/Output this shift: Total I/O In: 439.6 [I.V.:225.9; IV Piggyback:213.7] Out: -   General appearance: jaundiced Neurologic: opens eyes and looks to voice' Heart: regular rate and rhythm Lungs: rhonchi faint bilateral  Lab Results: Recent Labs    08/22/19 0349 08/23/19 0326  WBC 17.9* 18.8*  HGB 8.6* 9.3*  HCT 27.3* 30.6*  PLT 359 376   BMET:  Recent Labs    08/22/19 1651 08/23/19 0312  NA 151* 152*  K 3.2* 3.1*  CL 96* 99  CO2 40* 41*  GLUCOSE 199* 104*  BUN 75* 76*  CREATININE 1.57* 1.40*  CALCIUM 8.2* 7.9*    PT/INR:  Recent Labs    08/20/19 1202  LABPROT 18.7*  INR 1.6*   ABG    Component Value Date/Time   PHART 7.452 (H) 08/18/2019 1241   HCO3 37.0 (H) 08/18/2019 1241   TCO2 39 (H) 08/18/2019 1241   ACIDBASEDEF 1.0 08/08/2019 1115   O2SAT 55.5 08/23/2019 0312   CBG (last 3)  Recent Labs    08/23/19 0740 08/23/19 0744 08/23/19 0811  GLUCAP 40* 46* 128*    Assessment/Plan: S/P Procedure(s) (LRB): REMOVAL OF IMPELLA LEFT VENTRICULAR ASSIST DEVICE (N/A) TRANSESOPHAGEAL ECHOCARDIOGRAM (TEE)  (N/A) Remains critically ill NEURO- he is more alert currently even after receiving morphine during HIDA. Toxic metabolic encephalopathy- Opens eyes and looks when name called. This is the most alert I have seen him since the arrest.   CV- on milrinone and 0.1 of vasopressin, rhythm stable  Will hold bivalrudin pending result of HIDA in case perc drain needed  RESP- VDRF, trach, vent per CCM  RENAL- creatinine down slightly this AM  Hypokalemia persists- continue to give K  GI- LFTs slightly improved, still jaundiced  HIDA just done, await result  ENDO- hypoglycemic overnight with TF on hold  ID- afebrile since change to Zosyn, WBC still elevated  Cultures only growing yeast in sputum so far   LOS: 28 days    [name] 08/23/2019

## 2019-08-23 NOTE — Progress Notes (Signed)
Patient ID: Todd [name], male   DOB: 04-11-1955, 64 y.o.   MRN: [id number]     Advanced Heart Failure Rounding Note  PCP-Cardiologist: No primary care provider on file.   Subjective:    08/02/19 CABG 08/03/19 VF arrest. Chest open at bedside 08/03/19 Back to OR for ECMO cannulation 08/03/19 Washout out for tamponade 08/04/19 Repeat bedside washout for tamponade 08/06/19 To OR for decannulation and Impella 5.5 08/07/19 Underwent emergent bedside chest exploration at bedside for tamponade  08/10/19 Chest closed 08/16/19 HIT positive, bivalirudin begun 08/17/19 Impella removed 08/20/19 Tracheostomy  milrinone 0.125, NE 15, vasopressin 0.01.  MAP stable.  Co-ox 56%.   In NSR on amiodarone at 30, no further NSVT.   He is now off Lasix gtt.  I/Os net negative with bolus Lasix, CVP 7 today.     He is now afebrile with WBCs 19.  Meropenem and [name] stopped 7/14, Zosyn started.  Cultures negative.  Venous dopplers negative for DVT.   Ileus, improving.  Now getting tube feeds via post-pyloric tube.  Off TPN.   Creatinine 1.46 => 1.53 => 1.35 => 1.57 => 1.71 => 1.4.  Na 152 this morning, getting free water boluses.     Patient seems more alert today, CT head negative 7/11. EEG with evidence for diffuse encephalopathy. NH3 38.   Jaundiced with consistently elevated bilirubin (direct), tbili 15 today.  Abdominal US with gallbladder sludge, no definite cholecystitis.  HIDA scan today with patent CBD but no gallbladder activity.   Objective:   Weight Range: 71.7 kg Body mass index is 23.34 kg/m.   Vital Signs:   Temp:  [97.1 F (36.2 C)-100.6 F (38.1 C)] 100.6 F (38.1 C) (07/15 1200) Pulse Rate:  [66-92] 92 (07/15 1101) Resp:  [17-31] 20 (07/15 1101) BP: (86-127)/(48-58) 110/58 (07/15 1101) SpO2:  [97 %-100 %] 99 % (07/15 1101) Arterial Line BP: (80-130)/(43-65) 113/52 (07/15 0930) FiO2 (%):  [40 %] 40 % (07/15 1101) Weight:  [71.7 kg] 71.7 kg (07/15 0330) Last BM Date:  08/22/19  Weight change: Filed Weights   08/20/19 0600 08/22/19 0500 08/23/19 0330  Weight: 78 kg 72 kg 71.7 kg    Intake/Output:   Intake/Output Summary (Last 24 hours) at 08/23/2019 1301 Last data filed at 08/23/2019 1218 Gross per 24 hour  Intake 2932.76 ml  Output 5090 ml  Net -2157.24 ml      Physical Exam    General: NAD, awake on vent Neck: Trach. No JVD, no thyromegaly or thyroid nodule.  Lungs: Decreased at bases.  CV: Nondisplaced PMI.  Heart regular S1/S2, no S3/S4, no murmur.  No peripheral edema.   Abdomen: Soft, nontender, no hepatosplenomegaly, no distention.  Skin: Jaundice  Neurologic: Will track but will not follow commands.  Extremities: No clubbing or cyanosis.  HEENT: Normal.    Telemetry   NSR 80s Personally reviewed   Labs    CBC Recent Labs    08/22/19 0349 08/23/19 0326  WBC 17.9* 18.8*  HGB 8.6* 9.3*  HCT 27.3* 30.6*  MCV 91.0 93.0  PLT 359 562   Basic Metabolic Panel Recent Labs    08/22/19 0349 08/22/19 1250 08/22/19 1651 08/23/19 0312  NA 150*   < > 151* 152*  K 3.0*   < > 3.2* 3.1*  CL 94*   < > 96* 99  CO2 38*   < > 40* 41*  GLUCOSE 140*   < > 199* 104*  BUN 74*   < > 75* 76*  CREATININE 1.71*   < > 1.57* 1.40*  CALCIUM 8.2*   < > 8.2* 7.9*  MG 2.5*  --   --  2.5*  PHOS  --    < > 5.8* 5.1*   < > = values in this interval not displayed.   Liver Function Tests Recent Labs    08/22/19 0349 08/22/19 1400 08/22/19 1651 08/23/19 0312  AST 554*  --   --  359*  ALT 317*  --   --  267*  ALKPHOS 860*  --   --  675*  BILITOT 18.9*  --   --  14.9*  PROT 6.0*  --   --  5.7*  ALBUMIN 2.4*   < > 2.1* 1.9*   < > = values in this interval not displayed.   No results for input(s): LIPASE, AMYLASE in the last 72 hours. Cardiac Enzymes No results for input(s): CKTOTAL, CKMB, CKMBINDEX, TROPONINI in the last 72 hours.  BNP: BNP (last 3 results) Recent Labs    07/26/19 1236  BNP 2,568.2*    ProBNP (last 3  results) No results for input(s): PROBNP in the last 8760 hours.   D-Dimer No results for input(s): DDIMER in the last 72 hours. Hemoglobin A1C No results for input(s): HGBA1C in the last 72 hours. Fasting Lipid Panel Recent Labs    08/23/19 0312  TRIG 200*   Thyroid Function Tests No results for input(s): TSH, T4TOTAL, T3FREE, THYROIDAB in the last 72 hours.  Invalid input(s): FREET3  Other results:   Imaging    NM Hepatobiliary Liver Func  Result Date: 08/23/2019 CLINICAL DATA:  Cholecystitis scratch set evaluate cholecystitis. Elevated bilirubin. EXAM: NUCLEAR MEDICINE HEPATOBILIARY IMAGING TECHNIQUE: Sequential images of the abdomen were obtained out to 60 minutes following intravenous administration of radiopharmaceutical. RADIOPHARMACEUTICALS:  4.9 mCi Tc-2m Choletec IV COMPARISON:  Abdominal sonogram from 08/16/2019 FINDINGS: Prompt uptake of activity by the liver is seen. Delayed biliary activity is noted beginning around 25 minutes post injection. Biliary activity passes into small bowel, consistent with patent common bile duct. After 1 hour no gallbladder activity is identified and there is persistent diffuse radiotracer activity throughout the liver. The patient subsequently received 3 mg of morphine, IV and continuous imaging was performed for an additional 30 minutes. No gallbladder activity identified following morphine administration. IMPRESSION: 1. No gallbladder activity identified after 90 minutes of imaging and following IV administration of 3 mg of morphine. Patency of the cystic duct cannot be confirmed. 2. There is delayed biliary activity occurring approximately 25 minutes post injection of the radiopharmaceutical. In addition, there is persistence of radiotracer activity throughout the liver following 60 minutes and 90 minutes of imaging which suggest some degree of hepatic dysfunction. 3. Biliary to bowel activity is identified confirming patency of the common  bile duct. Electronically Signed   By: TKerby MoorsM.D.   On: 08/23/2019 11:23     Medications:     Scheduled Medications: . acetaZOLAMIDE  500 mg Per Tube BID  . artificial tears   Both Eyes Q8H  . aspirin  81 mg Per Tube Daily  . chlorhexidine gluconate (MEDLINE KIT)  15 mL Mouth Rinse BID  . Chlorhexidine Gluconate Cloth  6 each Topical Daily  . collagenase   Topical Daily  . docusate  200 mg Per Tube Daily  . feeding supplement (PROSource TF)  45 mL Per Tube BID  . fentaNYL (SUBLIMAZE) injection  50 mcg Intravenous Once  . free water  300  mL Per Tube Q6H  . furosemide  40 mg Intravenous BID  . insulin aspart  0-24 Units Subcutaneous Q4H  . insulin aspart  5 Units Subcutaneous Q4H  . ipratropium  0.5 mg Nebulization Q6H  . levalbuterol  1.25 mg Nebulization Q6H  . mouth rinse  15 mL Mouth Rinse 10 times per day  . pantoprazole (PROTONIX) IV  40 mg Intravenous QHS  . potassium chloride  40 mEq Per Tube BID  . sodium chloride flush  10-40 mL Intracatheter Q12H  . sodium chloride flush  10-40 mL Intracatheter Q12H  . sodium chloride flush  3 mL Intravenous Q12H    Infusions: . sodium chloride 250 mL (08/13/19 1412)  . sodium chloride Stopped (08/13/19 1631)  . albumin human 60 mL/hr at 08/23/19 1218  . amiodarone 30 mg/hr (08/23/19 1218)  . bivalirudin (ANGIOMAX) infusion 0.5 mg/mL (Non-ACS indications)    . chlorothiazide (DIURIL) IV 500 mg (08/23/19 1231)  . epinephrine Stopped (08/19/19 1251)  . feeding supplement (PIVOT 1.5 CAL) 1,000 mL (08/22/19 1600)  . fentaNYL infusion INTRAVENOUS Stopped (08/21/19 0610)  . lactated ringers 20 mL/hr at 08/10/19 1705  . lactated ringers 10 mL/hr at 08/18/19 0054  . milrinone 0.125 mcg/kg/min (08/23/19 1218)  . norepinephrine (LEVOPHED) Adult infusion 15 mcg/min (08/23/19 1218)  . piperacillin-tazobactam (ZOSYN)  IV Stopped (08/23/19 1147)  . vasopressin Stopped (08/23/19 1201)    PRN Medications: sodium chloride, sodium  chloride, dextrose, fentaNYL, metoprolol tartrate, midazolam, morphine injection, ondansetron (ZOFRAN) IV, sodium chloride flush, sodium chloride flush, sodium chloride flush   Assessment/Plan   1. Shock: At this point, think mixed cardiogenic/septic.  Echo with EF 20-25%, moderate RV dysfunction.  VA ECMO post-VF arrest, cannulated 6/25.  Stable s/p return to OR for mediastinal re-exploration due to bleeding on 6/25.  Decannulated from [place] ECMO on 08/06/19. Impella 5.5 placed. Underwent emergent chest re-exploration on 6/29 for tamponade and clot removal. Chest closed 7/2.  Impella 5.5 removed 7/9.  This morning, he is on NE 15, milrinone 0.125, vasopressin 0.01.  Suspect component of septic/vasodilatory shock with prior fever, elevated WBCs.  Co-ox 56%, CVP 7.  Creatinine lower at 1.4.  - Continue pressor wean, stop vasopressin now.   - Lasix 40 mg IV bid again today to aim to keep even, agree with acetazolamide with elevated HCO3. .    2. 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  Prolonged VF arrest on 6/25. Chest closed 7/2.  - ASA  - Hold Crestor with elevated LFTs.  3. Cardiac arrest: VF arrest with prolonged code. Still with episodes of VT/NSVT in setting of low K (difficult to effectively replace).  - Continue aggressive K repletion.   - Continue amiodarone 30 mg/hr.   - Eventual evaluation for ICD or lifevest at d/c - Keep K > 4.0 Mg > 2.0 4. Acute hypoxemic respiratory failure: In setting of cardiac arrest.  H/o COPD.  Bibasilar infiltrates, possible HCAP.  Now with tracheostomy.  - Abx changed to Zosyn, afebrile.  5. Acute blood loss anemia: Now s/p mediastinal re-exploration x 3. Hgb stable today.  CT drainage has slowed.   - Now on bivalirudin gtt. 6. 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  Platelets have trended back up.  7. ID: Suspect HCAP, on meropenem/anidulafungin/vanco initially with ongoing fevers and leukocytosis. Venous  dopplers negative for DVT. Possible drug fever from meropenem, he was switched to Zosyn on 7/14 and is now afebrile.  He had  abdominal US with gallbladder sludge, no definite cholecystitis.  HIDA scan done, GB does not opacify => ?acalculous cholecystitis.  - Think IR for percutaneous GB drain is reasonable.  - ID following.  - Continue Zosyn.   8. Elevated bilirubin: Mostly direct = likely cholestatic/shock liver/RV failure predominantly.   Abdominal US with evidence for cirrhosis, sludge in GB but no definite acute cholecystitis. NH3 was not significantly elevated. Tbili 15 today.  HIDA scan showed patent common bile duct, so not obstructive stone causing elevated bilirubin.  9. Ileus: Improved.  Now getting tube feeds and off TPN. 10. Atrial fibrillation/flutter: Paroxysmal. Back in NSR today.     - Continue amiodarone gtt.  - bivalirudin gtt.  11. Neuro: Does not respond to commands but tracking today. CT head negative on 7/11. EEG with e/o diffuse encephalopathy.  NH3 38.  - Head MRI when pacing wires out.  12. Hypernatremia: Free water boluses 300 cc q6 hrs.   CRITICAL CARE Performed by: Loralie Champagne  Total critical care time: 40 minutes at bedside  Critical care time was exclusive of separately billable procedures and treating other patients.  Critical care was necessary to treat or prevent imminent or life-threatening deterioration.  Critical care was time spent personally by me on the following activities: development of treatment plan with patient and/or surrogate as well as nursing, discussions with consultants, evaluation of patient's response to treatment, examination of patient, obtaining history from patient or surrogate, ordering and performing treatments and interventions, ordering and review of laboratory studies, ordering and review of radiographic studies, pulse oximetry and re-evaluation of patient's condition.   Length of Stay: [place], MD  08/23/2019, 1:01  PM  Advanced Heart Failure Team Pager [phone] (M-F; 7a - 4p)  Please contact [facility] for night-coverage after hours (4p -7a ) and weekends on [email or web address]

## 2019-08-23 NOTE — Progress Notes (Signed)
Discussed HIDA findings with CHF and TCTS teams.  Given lack of better explanation for out of proportion biliary stasis (vs. RHF) and fevers (although drug fever in differential), reasonable to place biliary drain in case this has been acalculous cholecystitis along.  Myrla Halsted MD PCCM

## 2019-08-23 NOTE — Progress Notes (Signed)
[name] for Infectious Disease   Reason for visit: Follow up on fever  Interval History: he has been afebrile about 24 hours, WBC stable at 18.8.  Remains off of sedation. No acute events. T bili down to 14.9.  HIDA scan with delayed biliary activity with some possible hepatic dysfunction Day 2 piperacillin/tazobactam Day 22 total antibiotics  Physical Exam: Constitutional:  Vitals:   08/23/19 0930 08/23/19 1101  BP:  (!) 110/58  Pulse: 86 92  Resp:  20  Temp:    SpO2: 100% 99%   patient appears in NAD Eyes: icteric; eyes open HENT: + trach Respiratory: Normal respiratory effort; CTA B Cardiovascular: tachy RR, hyperdynamic Neuro: alert, does not follow commands for me, eyes open  Review of Systems: Unable to be assessed due to patient factors  Lab Results  Component Value Date   WBC 18.8 (H) 08/23/2019   HGB 9.3 (L) 08/23/2019   HCT 30.6 (L) 08/23/2019   MCV 93.0 08/23/2019   PLT 376 08/23/2019    Lab Results  Component Value Date   CREATININE 1.40 (H) 08/23/2019   BUN 76 (H) 08/23/2019   NA 152 (H) 08/23/2019   K 3.1 (L) 08/23/2019   CL 99 08/23/2019   CO2 41 (H) 08/23/2019    Lab Results  Component Value Date   ALT 267 (H) 08/23/2019   AST 359 (H) 08/23/2019   ALKPHOS 675 (H) 08/23/2019     Microbiology: Recent Results (from the past 240 hour(s))  Culture, respiratory (non-expectorated)     Status: None   Collection Time: 08/13/19  4:12 PM   Specimen: Tracheal Aspirate; Respiratory  Result Value Ref Range Status   Specimen Description TRACHEAL ASPIRATE  Final   Special Requests NONE  Final   Gram Stain   Final    NO WBC SEEN FEW YEAST Performed at [facility], [place] [address] [place], [place] [address]    Culture FEW CANDIDA TROPICALIS  Final   Report Status 08/16/2019 FINAL  Final  Culture, Urine     Status: None   Collection Time: 08/15/19  6:13 PM   Specimen: Urine, Catheterized  Result Value Ref Range Status   Specimen  Description URINE, CATHETERIZED  Final   Special Requests NONE  Final   Culture   Final    NO GROWTH Performed at [facility], [address] [place], [place] [address]    Report Status 08/17/2019 FINAL  Final  Culture, blood (routine x 2)     Status: None   Collection Time: 08/15/19  7:53 PM   Specimen: BLOOD RIGHT HAND  Result Value Ref Range Status   Specimen Description BLOOD RIGHT HAND  Final   Special Requests   Final    BOTTLES DRAWN AEROBIC AND ANAEROBIC Blood Culture adequate volume   Culture   Final    NO GROWTH 5 DAYS Performed at [facility], [place] [address], [place], [place] [address]    Report Status 08/20/2019 FINAL  Final  Culture, blood (routine x 2)     Status: None   Collection Time: 08/15/19  7:53 PM   Specimen: BLOOD RIGHT HAND  Result Value Ref Range Status   Specimen Description BLOOD RIGHT HAND  Final   Special Requests   Final    BOTTLES DRAWN AEROBIC AND ANAEROBIC Blood Culture adequate volume   Culture   Final    NO GROWTH 5 DAYS Performed at [facility], [address] [place], [place] [address]    Report Status 08/20/2019 FINAL  Final  Aerobic/Anaerobic Culture (surgical/deep wound)     Status: None   Collection Time: 08/17/19  2:32 PM   Specimen: Wound  Result Value Ref Range Status   Specimen Description WOUND  Final   Special Requests NONE  Final   Gram Stain NO WBC SEEN NO ORGANISMS SEEN   Final   Culture   Final    No growth aerobically or anaerobically. Performed at [facility], [place] [address], [place], [place] [address]    Report Status 08/22/2019 FINAL  Final  Culture, fungus without smear     Status: None (Preliminary result)   Collection Time: 08/20/19  2:00 PM   Specimen: Bronchoalveolar Lavage; Other  Result Value Ref Range Status   Specimen Description BRONCHIAL ALVEOLAR LAVAGE  Final   Special Requests Normal  Final   Culture   Final    YEAST IDENTIFICATION TO FOLLOW Performed at [facility], [address], [place], [place] [address]    Report Status PENDING  Incomplete  Culture, bal-quantitative     Status: None (Preliminary result)   Collection Time: 08/20/19  2:00 PM   Specimen: Bronchoalveolar Lavage; Respiratory  Result Value Ref Range Status   Specimen Description BRONCHIAL ALVEOLAR LAVAGE  Final   Special Requests NONE  Final   Gram Stain   Final    FEW WBC PRESENT, PREDOMINANTLY PMN NO ORGANISMS SEEN    Culture   Final    CULTURE REINCUBATED FOR BETTER GROWTH Performed at [facility], [place] [address], [place], [place] [address]    Report Status PENDING  Incomplete  Body fluid culture     Status: None (Preliminary result)   Collection Time: 08/22/19  9:14 AM   Specimen: Pleura; Body Fluid  Result Value Ref Range Status   Specimen Description PLEURAL  Final   Special Requests NONE  Final   Gram Stain NO WBC SEEN NO ORGANISMS SEEN   Final   Culture   Final    NO GROWTH < 24 HOURS Performed at [facility], [address] [place], [place] [address]    Report Status PENDING  Incomplete    Impression/Plan:  1. Fever - has been afebrile now about 24 hours since stopping meropenem and anidulafungin.  May be drug fever.  Started piperacillin/tazobactam as well and blood cultures sent.   Will continue to monitor, continue with pip/tazo  2. Leukocytosis -  Stable today Will continue to monitor  3.  Liver dysfunction - T bili with some improvement today.  Unclear etiology but ? Right heart failure. HIDA scan noted.    4.  Shock - seems largely cardiogenic.  Remains on pressor support.

## 2019-08-23 NOTE — Progress Notes (Signed)
CRITICAL VALUE ALERT  Critical Value: Hypoglycemic Event  CBG:40  Treatment: D50 50 mL (25 gm)  Symptoms: None  Follow-up CBG: Time:0811 CBG Result:128  Possible Reasons for Event: Inadequate meal intake/tube feeds stopped for procedure  Comments/MD notified:Dr. Katrinka Blazing made aware no new orders    Marcelino Scot

## 2019-08-23 NOTE — Progress Notes (Signed)
Inpatient Diabetes Program Recommendations  AACE/ADA: New Consensus Statement on Inpatient Glycemic Control (2015)  Target Ranges:  Prepandial:   less than 140 mg/dL      Peak postprandial:   less than 180 mg/dL (1-2 hours)      Critically ill patients:  140 - 180 mg/dL   Lab Results  Component Value Date   GLUCAP 103 (H) 08/23/2019   HGBA1C 9.0 (H) 07/26/2019    Review of Glycemic Control Results for Todd [name], Todd [name] (MRN [id number]) as of 08/23/2019 13:00  Ref. Range 08/23/2019 07:40 08/23/2019 07:44 08/23/2019 08:11 08/23/2019 11:17  Glucose-Capillary Latest Ref Range: 70 - 99 mg/dL 40 (LL) 46 (L) 370 (H) 103 (H)   Diabetes history: Type 2 DM Outpatient Diabetes medications: Farxiga 10 mg QD, Actos 45 mg QD, MetaGlip 2.5-500 mg BID Current orders for Inpatient glycemic control: Novolog 0-24 units Q4H, Novolog 5 units Q4H  Inpatient Diabetes Program Recommendations:    Noted hypoglycemia of 40 mg/dL and subsequent discontinuation of Levemir. In agreement and will follow.  Thanks, Lujean Rave, MSN, RNC-OB Diabetes Coordinator [phone] (8a-5p)

## 2019-08-23 NOTE — Progress Notes (Signed)
CTS  Low BP on return from abdominal CT Will give volume and resume low dose vasopressin amio bolus for rapid AF  Blood pressure (!) 131/59, pulse 92, temperature (!) 101.7 F (38.7 C), temperature source Rectal, resp. rate (!) 22, height 5\' 9"  (1.753 m), weight 71.7 kg, SpO2 100 %.

## 2019-08-23 NOTE — Progress Notes (Signed)
NAME:  Todd [name], MRN:  [id number], DOB:  September 26, 1955, LOS: 17 ADMISSION DATE:  07/26/2019, CONSULTATION DATE:  08/03/19 REFERRING MD:  Roxan Hockey, CHIEF COMPLAINT:  ECMO   Brief History   VA ECMO for post CABG Vtach arrest  History of present illness   Presented with worsening dyspnea 6/17 c/w CHF exacerbation Cath with severe triple vessel disease Underwent CABG 6/24 Vtach arrest 6/25 Chest opened bedside and cardiac massage initiated as well as multiple cardioversions, amiodarone, bicarb etc Brought to OR and cannulated for VA ECMO PCCM consulted to assist with management Comorbidities include DM, heavy smoking, COPD  Past Medical History  Depression Ischemic cardiomyopathy HTN [place] [facility] Events   6/24 CABG 6/25 VA cannulation 6/28 decannulated and impella placed 6/29 bedside re-exploration; s/p decannulation. Placement of impella device originally at p8 but decreased to p4 2/2 suction events overnight up to p6. Echo completed and repositioned device. Remained on considerable support with 8 epi and 46 norepi vaso 0.05. continued chest tube output with large clots noted. Heparin thru device. Replacement products ongoing. 60% 8 peep 6/30: bedside mediastinal re-exploration and clean out of hematoma with tamponade and worsening hemodynamics. Pt had large volume transfusion. rebolused with amio 2/2 nsvt episode.  Weight up 48 pounds.  Started diuresis, Lasix drip started. 7/1 iatrogenic respiratory alkalosis, vent rate decreased. 7/2: No significant issues overnight remains on pressors and Impella not tolerating tube feeds with high gastric output awaiting core track placement 7/3: started trickle TF  7/5: Swab removed, CVL and PICC placed. 7/13 Trach  Consults:  Advanced heart failure, PCCM, TCTS  Procedures:  See below  Significant Diagnostic Tests:  6/22 spirometry with restrictive physiology, preserved FEV1/FVC CXR today with bilateral airspace disease,  cannulas in good position, deep left sulcus that was present yesterday, no clear PTX 6/18 echo: LVEF 52-84%, grade I diastolic dysfunction 7/7: US Abdomen cholelithiasis without cholecystitis. Mild wall thinking in setting of liver disease and ascites.   Micro Data:  COVID and HIV Neg 7/3 respiratory-few Candida tropicalis 7/5 blood>> 7/5 respiratory-few Candida tropicalis 7/7 urine-NG 7/7 blood>>NG 7/9 wound>> NG 7/12 BAL>>NG  Antimicrobials:  Vanc6/24>>7/13 Cefuroxime 6/24>> 6/25 merrem 6/26->7/14 Anidulafungin 7/8->7/14 Zosyn 7/14>> Interim history/subjective:  No events. Had periods of waking up yesterday and tracking but not following commands. Hypoglycemic this AM due to TF being held for HIDA scan.  Objective   Blood pressure (!) 86/48, pulse 83, temperature 98.8 F (37.1 C), temperature source Core (Comment), resp. rate (!) 21, height _0  (1.753 m), weight 71.7 kg, SpO2 99 %. CVP:  [9 mmHg-15 mmHg] 9 mmHg  Vent Mode: PRVC FiO2 (%):  [40 %] 40 % Set Rate:  [20 bmp] 20 bmp Vt Set:  [420 mL] 420 mL PEEP:  [8 cmH20] 8 cmH20 Pressure Support:  [13 cmH20] 13 cmH20 Plateau Pressure:  [14 cmH20-24 cmH20] 24 cmH20   Intake/Output Summary (Last 24 hours) at 08/23/2019 0755 Last data filed at 08/23/2019 1324 Gross per 24 hour  Intake 3806.7 ml  Output 4385 ml  Net -578.3 ml   Filed Weights   08/20/19 0600 08/22/19 0500 08/23/19 0330  Weight: 78 kg 72 kg 71.7 kg    Examination: GEN: ill appearing man in NAD HEENT: ETT in place, minimal secretions CV: RRR, ext warm PULM: scattered rhonci, triggering vent GI: Soft, hypoactive BS, rectal tube in place EXT: Anasarca, mild NEURO: less responsive today but withdraws to pain PSYCH: cannot assess SKIN: Jaundiced  WBC trending up Plts/Hgb stable PTT  88 Sodium up Cr stable Bili improved LDH improved  Resolved Hospital Problem list   N/A  Assessment & Plan:   Cardiogenic/hemorrhagic shock status post CABG  complicated by VT arrest, EF 20 to 25% with severe LV dysfunction and moderate RV dysfunction.  S/p VA ECMO (decanulated 6/28). 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  Hyperbilirubinemia Cirrhosis per 7/8 [place] -LFTs are stable, bili imrpoved - Plan for HIDA today given sludge on [place] - Continue to trend  T2DM -sugars low without TF, switch  To SSI for now until restarted   Fever curve- cultures negative to date. LE duplex neb. - Switched to zosyn 7/14 by ID - Trend  Acute blood loss anemia-stable Thrombocytopenia--platelets improving since discontinuation of heparin.  -Continue bivalirudin -Continue to monitor CBCs  High risk malnutrition  -Continue Tube Feeds  Acute encephalopathy due to sedation- sedation off, EEG/CT head/ammonia do not explain - MRI brain when pacer wires able to come out  Best practice:  Diet: Tube feeds on hold for HIDA Pain/Anxiety/Delirium protocol (if indicated): PRN only VAP protocol (if indicated): in place DVT prophylaxis: bival GI prophylaxis: PPI Glucose control: see above Mobility: BR Code Status: Full Family Communication: per primary Disposition:  ICU   The patient is critically ill with multiple organ systems failure and requires high complexity decision making for assessment and support, frequent evaluation and titration of therapies, application of advanced monitoring technologies and  extensive interpretation of multiple databases. Critical Care Time devoted to patient care services described in this note independent of APP/resident time (if applicable)  is 41 minutes.   Erskine Emery MD [facility] 08/23/2019 7:55 AM Personal pager: [phone] If unanswered, please page CCM On-call: [phone]

## 2019-08-23 NOTE — Progress Notes (Signed)
Pt transported to CT and back to 2H room 2 with no complications.

## 2019-08-23 NOTE — Progress Notes (Signed)
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+  Labs: Recent Labs    08/20/19 1202 08/20/19 1507 08/21/19 0406 08/21/19 1700 08/22/19 0349 08/22/19 1250 08/22/19 1400 08/22/19 1651 08/23/19 0312 08/23/19 0326 08/23/19 0447  HGB  --    < > 9.3*  --  8.6*  --   --   --   --  9.3*  --   HCT  --   --  28.3*  --  27.3*  --   --   --   --  30.6*  --   PLT  --   --  319  --  359  --   --   --   --  376  --   APTT  --    < > 70*  --  80*  --   --   --  157*  --  88*  LABPROT 18.7*  --   --   --   --   --   --   --   --   --   --   INR 1.6*  --   --   --   --   --   --   --   --   --   --   CREATININE 1.45*   < > 1.57*   < > 1.71*   < > 1.46* 1.57* 1.40*  --   --    < > = values in this interval not displayed.    Assessment: 64 yo male s/p ECMO and Impella. Chest closed 7/2. CT drainage decreased.    HIT ab sent a few days ago and returned as 2.6 - positive, switched from heparin to bivalirudin. SRA positive, confirmed for HIT.  APTT this morning is slightly supratherapeutic (88) on bivalirudin 0.055 mg/kg/hr. Prior aPTT of 157 was drawn incorrectly and is inaccurate. Patient to get placement of biliary drain this afternoon, so bivalirudin held since this morning.  Will follow-up timing of restarting bivalirudin post-IR. Given significant decrease in weight (>10% decrease), will change dosing weight from 83 kg to updated 71 kg and continue current drip rate. Hgb 9.3, plt 376. LDH 378. No s/sx of bleeding or infusion issues.   Goals of Therapy: APTT 50-85 s  Plan: Continue to hold bivalirudin prior to IR  Will restart bivalirudin at 0.055 mg/kg/hr with new dosing weight post-IR Check 4-hr aPTT after restarting Monitor daily aPTT, CBC, and for s/sx of bleeding  Tama Headings, PharmD PGY2 Cardiology Pharmacy Resident Phone: [phone] 08/23/2019  10:17 AM  Please check [email or web address] for unit-specific pharmacy  phone numbers.

## 2019-08-23 NOTE — TOC Progression Note (Signed)
Transition of Care [facility]) - Progression Note    Patient Details  Name: Todd [name] MRN: [id number] Date of Birth: [birth year]  [facility] CM/SW Contact  [name], RN Phone Number: 08/23/2019, 10:51 AM  Clinical Narrative:    Case Management spoke with [facility] care RN case management, who called to check on progress of the patient.  Case manager from [facility] offered support and can be reached at a later date for transitions of care.  Update given regarding patients medical status per the physician notes from today.  Will continue to follow.        Expected Discharge Plan and Services       Social Determinants of Health (SDOH) Interventions    Readmission Risk Interventions No flowsheet data found.

## 2019-08-24 ENCOUNTER — Inpatient Hospital Stay (HOSPITAL_COMMUNITY): Payer: Medicare Other

## 2019-08-24 LAB — COOXEMETRY PANEL
Carboxyhemoglobin: 1.3 % (ref 0.5–1.5)
Methemoglobin: 1 % (ref 0.0–1.5)
O2 Saturation: 63.8 %
Total hemoglobin: 7.4 g/dL — ABNORMAL LOW (ref 12.0–16.0)

## 2019-08-24 LAB — GLUCOSE, CAPILLARY
Glucose-Capillary: 104 mg/dL — ABNORMAL HIGH (ref 70–99)
Glucose-Capillary: 115 mg/dL — ABNORMAL HIGH (ref 70–99)
Glucose-Capillary: 141 mg/dL — ABNORMAL HIGH (ref 70–99)
Glucose-Capillary: 169 mg/dL — ABNORMAL HIGH (ref 70–99)
Glucose-Capillary: 208 mg/dL — ABNORMAL HIGH (ref 70–99)
Glucose-Capillary: 157 mg/dL — ABNORMAL HIGH (ref 70–99)

## 2019-08-24 LAB — COMPREHENSIVE METABOLIC PANEL
ALT: 197 U/L — ABNORMAL HIGH (ref 0–44)
AST: 239 U/L — ABNORMAL HIGH (ref 15–41)
Albumin: 2.9 g/dL — ABNORMAL LOW (ref 3.5–5.0)
Alkaline Phosphatase: 554 U/L — ABNORMAL HIGH (ref 38–126)
Anion gap: 15 (ref 5–15)
BUN: 77 mg/dL — ABNORMAL HIGH (ref 8–23)
CO2: 35 mmol/L — ABNORMAL HIGH (ref 22–32)
Calcium: 9.1 mg/dL (ref 8.9–10.3)
Chloride: 102 mmol/L (ref 98–111)
Creatinine, Ser: 1.69 mg/dL — ABNORMAL HIGH (ref 0.61–1.24)
GFR calc Af Amer: 48 mL/min — ABNORMAL LOW (ref >60)
GFR calc non Af Amer: 42 mL/min — ABNORMAL LOW (ref >60)
Glucose, Bld: 78 mg/dL (ref 70–99)
Potassium: 2.7 mmol/L — CL (ref 3.5–5.1)
Sodium: 152 mmol/L — ABNORMAL HIGH (ref 135–145)
Total Bilirubin: 13.6 mg/dL — ABNORMAL HIGH (ref 0.3–1.2)
Total Protein: 6.3 g/dL — ABNORMAL LOW (ref 6.5–8.1)

## 2019-08-24 LAB — BASIC METABOLIC PANEL
Anion gap: 16 — ABNORMAL HIGH (ref 5–15)
Anion gap: 16 — ABNORMAL HIGH (ref 5–15)
BUN: 81 mg/dL — ABNORMAL HIGH (ref 8–23)
BUN: 80 mg/dL — ABNORMAL HIGH (ref 8–23)
CO2: 28 mmol/L (ref 22–32)
CO2: 29 mmol/L (ref 22–32)
Calcium: 8.6 mg/dL — ABNORMAL LOW (ref 8.9–10.3)
Calcium: 9 mg/dL (ref 8.9–10.3)
Chloride: 106 mmol/L (ref 98–111)
Chloride: 106 mmol/L (ref 98–111)
Creatinine, Ser: 1.87 mg/dL — ABNORMAL HIGH (ref 0.61–1.24)
Creatinine, Ser: 1.72 mg/dL — ABNORMAL HIGH (ref 0.61–1.24)
GFR calc Af Amer: 43 mL/min — ABNORMAL LOW (ref >60)
GFR calc Af Amer: 47 mL/min — ABNORMAL LOW (ref >60)
GFR calc non Af Amer: 37 mL/min — ABNORMAL LOW (ref >60)
GFR calc non Af Amer: 41 mL/min — ABNORMAL LOW (ref >60)
Glucose, Bld: 192 mg/dL — ABNORMAL HIGH (ref 70–99)
Glucose, Bld: 219 mg/dL — ABNORMAL HIGH (ref 70–99)
Potassium: 3.9 mmol/L (ref 3.5–5.1)
Potassium: 3.7 mmol/L (ref 3.5–5.1)
Sodium: 150 mmol/L — ABNORMAL HIGH (ref 135–145)
Sodium: 151 mmol/L — ABNORMAL HIGH (ref 135–145)

## 2019-08-24 LAB — RENAL FUNCTION PANEL
Albumin: 3.1 g/dL — ABNORMAL LOW (ref 3.5–5.0)
Anion gap: 16 — ABNORMAL HIGH (ref 5–15)
BUN: 80 mg/dL — ABNORMAL HIGH (ref 8–23)
CO2: 28 mmol/L (ref 22–32)
Calcium: 8.6 mg/dL — ABNORMAL LOW (ref 8.9–10.3)
Chloride: 106 mmol/L (ref 98–111)
Creatinine, Ser: 1.87 mg/dL — ABNORMAL HIGH (ref 0.61–1.24)
GFR calc Af Amer: 43 mL/min — ABNORMAL LOW (ref >60)
GFR calc non Af Amer: 37 mL/min — ABNORMAL LOW (ref >60)
Glucose, Bld: 193 mg/dL — ABNORMAL HIGH (ref 70–99)
Phosphorus: 6.6 mg/dL — ABNORMAL HIGH (ref 2.5–4.6)
Potassium: 3.9 mmol/L (ref 3.5–5.1)
Sodium: 150 mmol/L — ABNORMAL HIGH (ref 135–145)

## 2019-08-24 LAB — CBC
HCT: 27.9 % — ABNORMAL LOW (ref 39.0–52.0)
HCT: 29.8 % — ABNORMAL LOW (ref 39.0–52.0)
Hemoglobin: 8.2 g/dL — ABNORMAL LOW (ref 13.0–17.0)
Hemoglobin: 8.6 g/dL — ABNORMAL LOW (ref 13.0–17.0)
MCH: 28.8 pg (ref 26.0–34.0)
MCH: 28.4 pg (ref 26.0–34.0)
MCHC: 29.4 g/dL — ABNORMAL LOW (ref 30.0–36.0)
MCHC: 28.9 g/dL — ABNORMAL LOW (ref 30.0–36.0)
MCV: 97.9 fL (ref 80.0–100.0)
MCV: 98.3 fL (ref 80.0–100.0)
Platelets: 370 10*3/uL (ref 150–400)
Platelets: 429 10*3/uL — ABNORMAL HIGH (ref 150–400)
RBC: 2.85 MIL/uL — ABNORMAL LOW (ref 4.22–5.81)
RBC: 3.03 MIL/uL — ABNORMAL LOW (ref 4.22–5.81)
RDW: 26 % — ABNORMAL HIGH (ref 11.5–15.5)
RDW: 25.2 % — ABNORMAL HIGH (ref 11.5–15.5)
WBC: 18.4 10*3/uL — ABNORMAL HIGH (ref 4.0–10.5)
WBC: 18.7 10*3/uL — ABNORMAL HIGH (ref 4.0–10.5)
nRBC: 0.7 % — ABNORMAL HIGH (ref 0.0–0.2)
nRBC: 0.7 % — ABNORMAL HIGH (ref 0.0–0.2)

## 2019-08-24 LAB — MAGNESIUM
Magnesium: 2.7 mg/dL — ABNORMAL HIGH (ref 1.7–2.4)
Magnesium: 2.5 mg/dL — ABNORMAL HIGH (ref 1.7–2.4)

## 2019-08-24 LAB — LACTATE DEHYDROGENASE: LDH: 284 U/L — ABNORMAL HIGH (ref 98–192)

## 2019-08-24 LAB — TRIGLYCERIDES: Triglycerides: 252 mg/dL — ABNORMAL HIGH (ref <150)

## 2019-08-24 LAB — APTT
aPTT: 48 seconds — ABNORMAL HIGH (ref 24–36)
aPTT: 80 seconds — ABNORMAL HIGH (ref 24–36)
aPTT: 78 seconds — ABNORMAL HIGH (ref 24–36)

## 2019-08-24 MED ORDER — FENTANYL CITRATE (PF) 100 MCG/2ML IJ SOLN
INTRAMUSCULAR | Status: AC
  Filled 2019-08-24: qty 2

## 2019-08-24 MED ORDER — MIDAZOLAM HCL 2 MG/2ML IJ SOLN
INTRAMUSCULAR | Status: AC | PRN
  Administered 2019-08-24: 0.5 mg via INTRAVENOUS

## 2019-08-24 MED ORDER — FENTANYL CITRATE (PF) 100 MCG/2ML IJ SOLN
INTRAMUSCULAR | Status: AC | PRN
  Administered 2019-08-24: 12.5 ug via INTRAVENOUS

## 2019-08-24 MED ORDER — POTASSIUM CHLORIDE 20 MEQ/15ML (10%) PO SOLN
40.0000 meq | Freq: Once | ORAL | Status: AC
  Administered 2019-08-24: 40 meq via ORAL
  Filled 2019-08-24: qty 30

## 2019-08-24 MED ORDER — FUROSEMIDE 10 MG/ML IJ SOLN
40.0000 mg | Freq: Two times a day (BID) | INTRAMUSCULAR | Status: DC
  Administered 2019-08-24 (×2): 40 mg via INTRAVENOUS
  Filled 2019-08-24 (×2): qty 4

## 2019-08-24 MED ORDER — POTASSIUM CHLORIDE 10 MEQ/50ML IV SOLN
10.0000 meq | INTRAVENOUS | Status: AC
  Administered 2019-08-24 (×3): 10 meq via INTRAVENOUS
  Filled 2019-08-24 (×3): qty 50

## 2019-08-24 MED ORDER — FREE WATER
300.0000 mL | Status: DC
  Administered 2019-08-24 – 2019-08-25 (×4): 300 mL

## 2019-08-24 MED ORDER — MIDAZOLAM HCL 2 MG/2ML IJ SOLN
INTRAMUSCULAR | Status: AC
  Filled 2019-08-24: qty 2

## 2019-08-24 MED ORDER — SODIUM CHLORIDE 0.9 % IV SOLN
0.0550 mg/kg/h | INTRAVENOUS | Status: DC
  Administered 2019-08-24 – 2019-09-04 (×8): 0.055 mg/kg/h via INTRAVENOUS
  Filled 2019-08-24 (×7): qty 250

## 2019-08-24 MED ORDER — POTASSIUM CHLORIDE 10 MEQ/50ML IV SOLN
10.0000 meq | INTRAVENOUS | Status: AC
  Administered 2019-08-24 (×6): 10 meq via INTRAVENOUS
  Filled 2019-08-24 (×6): qty 50

## 2019-08-24 MED ORDER — POTASSIUM CHLORIDE 10 MEQ/50ML IV SOLN
10.0000 meq | INTRAVENOUS | Status: AC
  Administered 2019-08-24 – 2019-08-25 (×3): 10 meq via INTRAVENOUS
  Filled 2019-08-24 (×4): qty 50

## 2019-08-24 MED ORDER — SODIUM CHLORIDE 0.9% FLUSH
5.0000 mL | Freq: Three times a day (TID) | INTRAVENOUS | Status: DC
  Administered 2019-08-24 – 2019-10-08 (×117): 5 mL

## 2019-08-24 NOTE — TOC Progression Note (Signed)
Transition of Care [facility]) - Progression Note    Patient Details  Name: Todd [name] MRN: [id number] Date of Birth: [birth year]  [facility]) CM/SW Contact  [name], RN Phone Number: 08/24/2019, 10:58 AM  Clinical Narrative:    Case management is following patient - Medical workup continues.  [name], [facility] reached out to follow patient for possible LTAC placement in the next 1-2 weeks once the patient is medically stable.  Will continue to follow patient for TOC needs.   Expected Discharge Plan: Long Term Acute Care (LTAC) Barriers to Discharge: Continued Medical Work up  Expected Discharge Plan and Services Expected Discharge Plan: Long Term Acute Care (LTAC)       Social Determinants of Health (SDOH) Interventions    Readmission Risk Interventions No flowsheet data found.

## 2019-08-24 NOTE — Progress Notes (Signed)
Patient appeared to be showing signs of respiratory distress( decrease Sp02, increased work of breathing) Placed patient on full support for the night, will continue wean in the morning.

## 2019-08-24 NOTE — Progress Notes (Signed)
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+  Labs: Recent Labs    08/22/19 0349 08/23/19 0312 08/23/19 0326 08/23/19 0326 08/23/19 0447 08/23/19 1537 08/23/19 1759 08/23/19 1759 08/23/19 1805 08/24/19 0335 08/24/19 0929  HGB   < >  --  9.3*   < >  --   --  8.8*   < > 9.9*  --  8.2*  HCT   < >  --  30.6*   < >  --   --  29.4*  --  29.0*  --  27.9*  PLT   < >  --  376  --   --   --  419*  --   --   --  370  APTT  --  157*  --   --  88*  --   --   --   --  48*  --   CREATININE  --  1.40*  --   --   --  1.62*  --   --   --  1.69*  --    < > = values in this interval not displayed.    Assessment: 64 yo male s/p ECMO and Impella. Chest closed 7/2. CT drainage decreased.    HIT ab sent a few days ago and returned as 2.6 - positive, switched from heparin to bivalirudin. SRA positive, confirmed for HIT.  APTT turned off since 7/15 for biliary drain placement in IR today. Low aPTT from this morning due to heparin being held. Pt now s/p drain placement. After discussion with Dr. Grace Isaac, will restart bivalirudin at prior 0.055 mg/kg/hr dose immediately post-procedure. Hgb 8.2, plt 370. LDH 284. No overt bleeding or infusion issues.   Goals of Therapy: APTT 50-85 s  Plan: Restart bivalirudin at 0.055 mg/kg/hr Check 4-hr aPTT after restarting Monitor daily aPTT, CBC, and for s/sx of bleeding  Tama Headings, PharmD PGY2 Cardiology Pharmacy Resident Phone: [phone] 08/24/2019  2:36 PM  Please check [email or web address] for unit-specific pharmacy phone numbers.

## 2019-08-24 NOTE — Consult Note (Signed)
Chief Complaint: Ongoing fevers, leokocytosis with elevated bilirubin with non filling gallbladder. Presents for cholecystomy tube placement.   Referring Physician(s): Dr. Adaline Sill  Supervising Physician: Gilmer Mor  Patient Status: [facility] - In-pt  History of Present Illness: Todd [name] is a 64 y.o. male history of Cardiogenic/ hemorrhagic shock s/p CABG on 6.24.21 complicated by VT arrest, ECMO (decannulated on 6.28.21) s/p impella placement on 6.28.21 removed on 7.9.21) now in MOD with hyperbilirubinuria and ongoing fevers with leukocytosis despite IV antibiotics. HIDA scan performed on 7.15.21 reads No gallbladder activity identified after 90 minutes of imaging and following IV administration of 3 mg of morphine. Patency of the cystic duct cannot be confirmed. Team is concerned for acalculous cholecystitis. Team is requesting cholecystostomy tube placement.   Past Medical History:  Diagnosis Date  . Arthritis   . CHF (congestive heart failure) (HCC)   . Depression   . Diabetes mellitus without complication (HCC)   . Hard of hearing   . Hyperlipidemia   . Hypertension     Past Surgical History:  Procedure Laterality Date  . APPENDECTOMY  1995  . APPLICATION OF WOUND VAC N/A 08/10/2019   Procedure: APPLICATION OF WOUND VAC;  Surgeon: [name], MD;  Location: [facility] OR;  Service: Thoracic;  Laterality: N/A;  Prevena dressing to wound vac  . CANNULATION FOR ECMO (EXTRACORPOREAL MEMBRANE OXYGENATION) N/A 08/03/2019   Procedure: CANNULATION FOR ECMO (EXTRACORPOREAL MEMBRANE OXYGENATION);  Surgeon: [name], MD;  Location: [facility] OR;  Service: Open Heart Surgery;  Laterality: N/A;  . CANNULATION FOR ECMO (EXTRACORPOREAL MEMBRANE OXYGENATION) N/A 08/06/2019   Procedure: DECANNULATION FOR ECMO (EXTRACORPOREAL MEMBRANE OXYGENATION);  Surgeon: [name], MD;  Location: [facility] OR;  Service: Open Heart Surgery;  Laterality: N/A;  . CAROTID ENDARTERECTOMY  2010  .  CORONARY ARTERY BYPASS GRAFT N/A 08/02/2019   Procedure: CORONARY ARTERY BYPASS GRAFTING (CABG), ON PUMP, TIMES FOUR, USING LEFT INTERNAL MAMMARY, LEFT RADIAL ARTERY, AND ENDOSCOPICALLY HARVESTED RIGHT GREATER SAPHENOUS VEIN;  Surgeon: [name], MD;  Location: [facility] OR;  Service: Open Heart Surgery;  Laterality: N/A;  . EXPLORATION POST OPERATIVE OPEN HEART N/A 08/07/2019   Procedure: MEDIASTINAL EXPLORATION POST OPERATIVE OPEN HEART IN 2H02;  Surgeon: [name], MD;  Location: [facility] OR;  Service: Open Heart Surgery;  Laterality: N/A;  Bedside procedure 2H02/emergency  . PLACEMENT OF IMPELLA LEFT VENTRICULAR ASSIST DEVICE Right 08/06/2019   Procedure: PLACEMENT OF IMPELLA LEFT VENTRICULAR ASSIST DEVICE;  Surgeon: [name], MD;  Location: [facility] OR;  Service: Open Heart Surgery;  Laterality: Right;  . RADIAL ARTERY HARVEST Left 08/02/2019   Procedure: LEFT RADIAL ARTERY HARVEST;  Surgeon: [name], MD;  Location: [facility] OR;  Service: Open Heart Surgery;  Laterality: Left;  . REMOVAL OF IMPELLA LEFT VENTRICULAR ASSIST DEVICE N/A 08/17/2019   Procedure: REMOVAL OF IMPELLA LEFT VENTRICULAR ASSIST DEVICE;  Surgeon: [name], MD;  Location: [facility] OR;  Service: Open Heart Surgery;  Laterality: N/A;  . RIGHT/LEFT HEART CATH AND CORONARY ANGIOGRAPHY N/A 07/30/2019   Procedure: RIGHT/LEFT HEART CATH AND CORONARY ANGIOGRAPHY;  Surgeon: Kathleene Hazel, MD;  Location: [facility] INVASIVE CV LAB;  Service: Cardiovascular;  Laterality: N/A;  . STERNAL CLOSURE N/A 08/10/2019   Procedure: STERNAL CLOSURE;  Surgeon: [name], MD;  Location: [facility] OR;  Service: Thoracic;  Laterality: N/A;  . TEE WITHOUT CARDIOVERSION N/A 08/02/2019   Procedure: TRANSESOPHAGEAL ECHOCARDIOGRAM (TEE);  Surgeon: [name], MD;  Location: [facility] OR;  Service: Open Heart Surgery;  Laterality: N/A;  . TEE WITHOUT CARDIOVERSION N/A 08/06/2019   Procedure: TRANSESOPHAGEAL ECHOCARDIOGRAM (TEE);   Surgeon: [name], MD;  Location: [facility] OR;  Service: Open Heart Surgery;  Laterality: N/A;  . TEE WITHOUT CARDIOVERSION N/A 08/10/2019   Procedure: TRANSESOPHAGEAL ECHOCARDIOGRAM (TEE);  Surgeon: [name], MD;  Location: [facility] OR;  Service: Thoracic;  Laterality: N/A;  . TEE WITHOUT CARDIOVERSION N/A 08/17/2019   Procedure: TRANSESOPHAGEAL ECHOCARDIOGRAM (TEE);  Surgeon: [name], MD;  Location: [facility] OR;  Service: Open Heart Surgery;  Laterality: N/A;  . WOUND DEBRIDEMENT N/A 08/03/2019   Procedure: DEBRIDEMENT WOUND;  Surgeon: [name], MD;  Location: [facility] OR;  Service: Vascular;  Laterality: N/A;    Allergies: Empagliflozin, Heparin, Other, Simvastatin, and Sitagliptin  Medications: Prior to Admission medications   Medication Sig Start Date End Date Taking? Authorizing Provider  citalopram (CELEXA) 40 MG tablet Take 40 mg by mouth daily.   Yes [provider]  dapagliflozin propanediol (FARXIGA) 10 MG TABS tablet Take 10 mg by mouth daily.   Yes [provider]  ergocalciferol (VITAMIN D2) 1.25 MG (50000 UT) capsule Take 50,000 Units by mouth 2 (two) times a week.   Yes [provider]  furosemide (LASIX) 20 MG tablet Take 40 mg by mouth daily. 07/16/19  Yes [provider]  glipiZIDE-metformin (METAGLIP) 2.5-500 MG tablet Take 2 tablets by mouth 2 (two) times daily with a meal.   Yes [provider]  ibuprofen (ADVIL) 200 MG tablet Take 400 mg by mouth 2 (two) times daily as needed for moderate pain.   Yes [provider]  levofloxacin (LEVAQUIN) 750 MG tablet Take 750 mg by mouth daily. For 14 days.   Yes [provider]  lisinopril (ZESTRIL) 20 MG tablet Take 20 mg by mouth daily.   Yes [provider]  oxyCODONE (ROXICODONE) 15 MG immediate release tablet Take 15 mg by mouth 3 (three) times daily.   Yes [provider]  pioglitazone (ACTOS) 45 MG tablet Take 45 mg by mouth daily.    Yes [provider]  promethazine (PHENERGAN) 25 MG tablet Take 25 mg by mouth 2 (two) times daily as needed for nausea or vomiting.   Yes [provider]     Family History  Problem Relation Age of Onset  . Heart disease Mother   . Hypertension Mother     Social History   Socioeconomic History  . Marital status: Unknown    Spouse name: Not on file  . Number of children: Not on file  . Years of education: Not on file  . Highest education level: Not on file  Occupational History  . Not on file  Tobacco Use  . Smoking status: Current Every Day Smoker    Packs/day: 2.50    Types: Cigarettes  . Smokeless tobacco: Never Used  Substance and Sexual Activity  . Alcohol use: Not on file  . Drug use: Yes    Frequency: 7.0 times per week    Types: Marijuana  . Sexual activity: Not on file  Other Topics Concern  . Not on file  Social History Narrative  . Not on file   Social Determinants of Health   Financial Resource Strain:   . Difficulty of Paying Living Expenses:   Food Insecurity:   . Worried About Programme researcher, broadcasting/film/video in the Last Year:   . [employer] of Food in the Last Year:  Transportation Needs:   . Freight forwarder (Medical):   Marland Kitchen Lack of Transportation (Non-Medical):   Physical Activity:   . Days of Exercise per Week:   . Minutes of Exercise per Session:   Stress:   . Feeling of Stress :   Social Connections:   . Frequency of Communication with Friends and Family:   . Frequency of Social Gatherings with Friends and Family:   . Attends Religious Services:   . Active Member of Clubs or Organizations:   . Attends Banker Meetings:   Marland Kitchen Marital Status:       Review of Systems: A 12 point ROS discussed and pertinent positives are indicated in the HPI above.  All other systems are negative.  Review of Systems  Unable to perform ROS: Acuity of condition    Vital Signs: BP (!) 113/47   Pulse 80   Temp (!) 100.8 F (38.2  C)   Resp (!) 27   Ht 5\' 9"  (1.753 m)   Wt 150 lb 5.7 oz (68.2 kg)   SpO2 100%   BMI 22.20 kg/m   Physical Exam Vitals and nursing note reviewed.  Constitutional:      Appearance: He is ill-appearing.  HENT:     Head: Normocephalic.  Cardiovascular:     Rate and Rhythm: Normal rate and regular rhythm.  Pulmonary:     Effort: Pulmonary effort is normal.     Breath sounds: Rhonchi present.     Comments: Patient has a trach. Left sided chest tube and right sided chest tube.  Musculoskeletal:        General: Normal range of motion.     Cervical back: Normal range of motion.  Skin:    General: Skin is warm and dry.  Neurological:     Comments: Unable to assess     Imaging: DG Chest 1 View  Result Date: 08/09/2019 CLINICAL DATA:  64 year old male admitted 07/26/2019, status post CABG 08/02/2019, emergent median sternotomy and cardiac massage for arrest 08/03/2019, ECMO initiation 08/03/2019 decannulation 08/06/2019, Impella placement 08/06/2019, mediastinal exploration 08/07/2019, with 4 chest tubes/mediastinal drains and open chest EXAM: CHEST  1 VIEW COMPARISON:  Multiple prior, most recent chest x-ray 08/08/2019 FINDINGS: Cardiomediastinal silhouette unchanged, with surgical changes of CABG. Unchanged position of the Impella device from right axillary artery approach. Unchanged position of the endotracheal tube terminating 5 cm above the carina. Unchanged gastric tube which terminates in the left upper quadrant. Unchanged right IJ sheath transmits Swan-Ganz catheter which terminates in the right pulmonary arteries at the hilum. Unchanged defibrillator pads. Unchanged mediastinal/pleural drains with epicardial pacing leads projecting over the lower mediastinum. Similar appearance of low lung volumes and hazy opacity at the right lung base. Obscuration of the right hemidiaphragm. No pneumothorax. IMPRESSION: Similar appearance of the chest x-ray to the prior, with right basilar opacity  representing pleural effusion and atelectasis/consolidation. Surgical support apparatus unchanged. Electronically Signed   By: Gilmer Mor D.O.   On: 08/09/2019 09:32   DG Chest 1 View  Result Date: 08/07/2019 CLINICAL DATA:  Intubation. EXAM: CHEST  1 VIEW COMPARISON:  Chest x-ray 08/06/2019. FINDINGS: Endotracheal tube, NG tube, Swan-Ganz catheter, bilateral chest tubes in stable position. Impella device in stable position. Prior CABG. Cardiomegaly. Mild bilateral interstitial prominence again noted small bilateral pleural effusions again noted. Findings consistent with CHF. Similar findings noted on prior exam. No pneumothorax. IMPRESSION: 1. Lines and tubes including bilateral chest tubes in stable position. No pneumothorax. 2. Prior CABG.  Cardiomegaly with bilateral interstitial prominence and bilateral pleural effusions again noted consistent with CHF. Similar findings noted on prior exam. Bibasilar atelectasis. Electronically Signed   By: Maisie Fus  Register   On: 08/07/2019 08:07   DG Chest 1 View  Result Date: 08/06/2019 CLINICAL DATA:  ECMO.  Ventilator dependence. EXAM: CHEST  1 VIEW COMPARISON:  08/05/2019 FINDINGS: Endotracheal tube tip is 4.8 cm above the base of the carina. No visible air column in the right mainstem bronchus. Right IJ pulmonary artery catheter tip is in the interlobar pulmonary artery, as before. The NG tube passes into the stomach although the distal tip position is not included on the film. Midline mediastinal/pericardial drain again noted with persistent bilateral chest tubes. ECMO catheter evident. No pneumothorax. Similar appearance of the diffuse interstitial and patchy basilar airspace disease, left greater than right. No substantial pleural effusion with left pleural fluid less visible today. Telemetry leads overlie the chest. IMPRESSION: 1. Interval improvement in left pleural effusion. 2. Otherwise no substantial interval change in exam. Diffuse interstitial and  basilar airspace disease, left greater than right. 3. Support apparatus as described. Electronically Signed   By: Kennith Center M.D.   On: 08/06/2019 07:32   DG Chest 1 View  Result Date: 08/03/2019 CLINICAL DATA:  Status post emergent ECMO and CABG. Rule out foreign bodies in pneumothorax. EXAM: CHEST  1 VIEW COMPARISON:  Earlier today FINDINGS: Support apparatus including: Endotracheal tube, ECMO device, mediastinal drain, bilateral chest tubes, and nasogastric tube identified. No pneumothorax identified. Heart size is mildly enlarged. Diffuse bilateral interstitial opacities compatible with pulmonary edema, increased from previous exam. IMPRESSION: 1. Bilateral chest tubes in place without evidence for pneumothorax. 2. Increase in pulmonary edema. Electronically Signed   By: Signa Kell M.D.   On: 08/03/2019 09:03   DG Chest 2 View  Result Date: 07/26/2019 CLINICAL DATA:  Shortness of breath and body aches for 2 weeks. History of congestive heart failure. EXAM: CHEST - 2 VIEW COMPARISON:  PA and lateral chest 05/14/2015 and 07/30/2014. FINDINGS: There is cardiomegaly, pulmonary edema and small effusions. No pneumothorax. No acute bony abnormality. IMPRESSION: Congestive heart failure. Electronically Signed   By: Drusilla Kanner M.D.   On: 07/26/2019 13:19   DG Abd 1 View  Result Date: 08/14/2019 CLINICAL DATA:  Feeding tube placement. EXAM: ABDOMEN - 1 VIEW COMPARISON:  08/13/2019 FINDINGS: Feeding tube tip noted projected over the mid abdomen. This is most likely transverse portion of the duodenum. Small amount of adjacent intra lung contrast noted. One image obtained. 2 minutes 50 seconds fluoroscopy time utilized. IMPRESSION: Feeding tube noted with tip most likely in the transverse portion of the duodenum. Electronically Signed   By: Maisie Fus  Register   On: 08/14/2019 13:05   CT HEAD WO CONTRAST  Result Date: 08/19/2019 CLINICAL DATA:  Subacute neurological deficits. Specific symptoms not  described. Recent LVAD removal. EXAM: CT HEAD WITHOUT CONTRAST TECHNIQUE: Contiguous axial images were obtained from the base of the skull through the vertex without intravenous contrast. COMPARISON:  04/30/2010 FINDINGS: Brain: Generalized age related volume loss. Chronic small-vessel ischemic changes affect the pons. Age indeterminate 1 cm infarction in the inferior cerebellum on the right, favored to be old. Chronic appearing small vessel ischemic changes of the cerebral hemispheric white matter. Old appearing infarction of the left basal ganglia. No suggestion of acute infarction. No mass, hemorrhage, hydrocephalus or extra-axial collection. Vascular: There is atherosclerotic calcification of the major vessels at the base of the brain. Skull: Negative Sinuses/Orbits:  Clear/normal Other: Mastoid effusions. IMPRESSION: No acute finding by CT. Likely old ischemic changes throughout the brain as outlined above. Electronically Signed   By: Paulina Fusi M.D.   On: 08/19/2019 13:48   CT ABDOMEN W CONTRAST  Result Date: 08/23/2019 CLINICAL DATA:  Cholecystitis. Fever and leukocytosis. Elevated bilirubin. EXAM: CT ABDOMEN WITH CONTRAST TECHNIQUE: Multidetector CT imaging of the abdomen was performed using the standard protocol following bolus administration of intravenous contrast. CONTRAST:  80mL OMNIPAQUE IOHEXOL 300 MG/ML  SOLN COMPARISON:  07/30/2014 from [facility] FINDINGS: Lower chest: Small bilateral pleural effusions and dependent bibasilar atelectasis. Hepatobiliary: No hepatic masses identified. High attenuation sludge is seen in the gallbladder, however there are no findings of acute cholecystitis or biliary ductal dilatation. Pancreas:  No mass or inflammatory changes. Spleen:  Within normal limits in size and appearance. Adrenals/Urinary Tract: 1.4 cm left adrenal mass is seen which is nonspecific, but most likely represents a benign adenoma in patient without history of cancer. Moderate left  renal atrophy is seen. No evidence of renal masses or hydronephrosis. Stomach/Bowel: A feeding tube is seen with tip in the proximal jejunum. No evidence of dilated bowel loops. Mild diffuse mesenteric edema and ascites is seen, but there is no evidence of focal inflammatory process or abscess. Vascular/Lymphatic: No pathologically enlarged lymph nodes identified. No abdominal aortic aneurysm. Aortic atherosclerosis noted. Other:  None. Musculoskeletal:  No suspicious bone lesions identified. IMPRESSION: Gallbladder sludge. No radiographic evidence of acute cholecystitis or biliary ductal dilatation. Mild ascites and diffuse mesenteric edema. No evidence of focal inflammatory process or abscess. Small bilateral pleural effusions and dependent bibasilar atelectasis. Small indeterminate left adrenal mass. If patient has no history of malignancy, this is probably a benign adenoma and follow-up by CT is recommended in 12 months. If patient does have cancer history, adrenal protocol abdomen CT without and with contrast is recommended now for further characterization. This recommendation follows ACR consensus guidelines: Management of Incidental Adrenal Masses: A White Paper of the ACR Incidental Findings Committee. J Am Coll Radiol 2017;14:1038-1044. Aortic Atherosclerosis (ICD10-I70.0). Electronically Signed   By: Danae Orleans M.D.   On: 08/23/2019 17:54   NM Hepatobiliary Liver Func  Result Date: 08/23/2019 CLINICAL DATA:  Cholecystitis scratch set evaluate cholecystitis. Elevated bilirubin. EXAM: NUCLEAR MEDICINE HEPATOBILIARY IMAGING TECHNIQUE: Sequential images of the abdomen were obtained out to 60 minutes following intravenous administration of radiopharmaceutical. RADIOPHARMACEUTICALS:  4.9 mCi Tc-17m  Choletec IV COMPARISON:  Abdominal sonogram from 08/16/2019 FINDINGS: Prompt uptake of activity by the liver is seen. Delayed biliary activity is noted beginning around 25 minutes post injection. Biliary  activity passes into small bowel, consistent with patent common bile duct. After 1 hour no gallbladder activity is identified and there is persistent diffuse radiotracer activity throughout the liver. The patient subsequently received 3 mg of morphine, IV and continuous imaging was performed for an additional 30 minutes. No gallbladder activity identified following morphine administration. IMPRESSION: 1. No gallbladder activity identified after 90 minutes of imaging and following IV administration of 3 mg of morphine. Patency of the cystic duct cannot be confirmed. 2. There is delayed biliary activity occurring approximately 25 minutes post injection of the radiopharmaceutical. In addition, there is persistence of radiotracer activity throughout the liver following 60 minutes and 90 minutes of imaging which suggest some degree of hepatic dysfunction. 3. Biliary to bowel activity is identified confirming patency of the common bile duct. Electronically Signed   By: Signa Kell M.D.   On: 08/23/2019 11:23  CARDIAC CATHETERIZATION  Result Date: 07/30/2019  Prox RCA lesion is 100% stenosed.  Prox Cx to Mid Cx lesion is 100% stenosed.  1st Mrg-1 lesion is 80% stenosed.  1st Mrg-2 lesion is 90% stenosed.  Mid LAD lesion is 100% stenosed.  Ost LM to Mid LM lesion is 40% stenosed.  Ost LAD to Prox LAD lesion is 50% stenosed.  1st Diag lesion is 70% stenosed.  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 High grade disease in the Circumflex and OM CT surgery consult for CABG   DG Chest Port 1 View  Result Date: 08/20/2019 CLINICAL DATA:  Tracheostomy. EXAM: PORTABLE CHEST 1 VIEW COMPARISON:  08/20/2019 FINDINGS: Tracheostomy appears well positioned. Soft feeding tube enters the abdomen. Central line tip in the SVC above the right atrium. Bilateral chest tubes as seen previously. No pneumothorax. Edema and volume loss persists. No worsening or new finding otherwise. IMPRESSION: Good  appearance following tracheostomy placement. No complicating feature. Other lines and tubes unchanged. No pneumothorax. Edema and atelectasis as seen previously. Electronically Signed   By: Paulina Fusi M.D.   On: 08/20/2019 14:12   DG CHEST PORT 1 VIEW  Result Date: 08/20/2019 CLINICAL DATA:  Cardiac surgery. EXAM: PORTABLE CHEST 1 VIEW COMPARISON:  Multiple previous chest films. The most recent is 08/18/2019 FINDINGS: The endotracheal tube, NG tube, feeding tube, left subclavian central venous catheter, left PICC line and bilateral chest tubes are stable. External pacer paddles are noted. The cardiac silhouette, mediastinal and hilar contours are stable. Persistent interstitial edema, bibasilar atelectasis and small effusions. No pneumothorax. IMPRESSION: 1. Stable support apparatus. 2. Persistent interstitial edema, bibasilar atelectasis and small effusions. Electronically Signed   By: Rudie Meyer M.D.   On: 08/20/2019 07:45   DG Chest Port 1 View  Result Date: 08/18/2019 CLINICAL DATA:  Hypoxia EXAM: PORTABLE CHEST 1 VIEW COMPARISON:  August 15, 2019 FINDINGS: Endotracheal tube tip is 4.9 cm above the carina. Nasogastric tube tip and side port are below the diaphragm. Central catheter tips are in the superior vena cava. Temporary pacemaker wires are attached to the right heart. There is a chest tube on each side and a mediastinal drain. Impella device no longer appreciable. No pneumothorax. There are small pleural effusions with bibasilar atelectasis. Heart is upper normal in size with pulmonary vascularity normal. Patient is status post coronary artery bypass grafting. IMPRESSION: Tube and catheter positions as described without pneumothorax. Small pleural effusions bilaterally with bibasilar atelectasis. Stable cardiac silhouette. Electronically Signed   By: Bretta Bang III M.D.   On: 08/18/2019 08:10   DG CHEST PORT 1 VIEW  Result Date: 08/15/2019 CLINICAL DATA:  Chest tube EXAM: PORTABLE  CHEST 1 VIEW COMPARISON:  Yesterday FINDINGS: Endotracheal tube tip is at the clavicular heads. Feeding and gastric suction tubes reaches the stomach. Left PICC with tip at the SVC. Left subclavian sheath in place. Impella in stable position.Chest tubes in place. Hazy opacity from atelectasis and pleural effusions. No visible pneumothorax. Stable heart size. IMPRESSION: Stable hardware positioning, atelectasis, and pleural fluid. No visible pneumothorax. Electronically Signed   By: Marnee Spring M.D.   On: 08/15/2019 07:08   DG CHEST PORT 1 VIEW  Result Date: 08/14/2019 CLINICAL DATA:  Chest tube in place EXAM: PORTABLE CHEST 1 VIEW COMPARISON:  08/13/2019 FINDINGS: Endotracheal tube, gastric catheter and feeding catheter are noted and stable. Impella device is noted in satisfactory position. Swan-Ganz catheter has been removed in the interval. Left-sided PICC  line and left sided subclavian central line are now seen. Bilateral chest tubes are noted. Persistent right basilar opacity and effusion is seen. The left lung again shows retrocardiac opacities stable from the prior exam. The overlying interstitial prominence has improved likely related to decreased pulmonary edema. No pneumothorax is noted. IMPRESSION: Decrease in vascular congestion and edema. Persistent bibasilar opacities and effusions. New PICC line is seen in satisfactory position. Tubes and lines as described stable from the prior exam. Electronically Signed   By: Alcide Clever M.D.   On: 08/14/2019 02:36   DG CHEST PORT 1 VIEW  Result Date: 08/13/2019 CLINICAL DATA:  64 year old male status post central line placement. EXAM: PORTABLE CHEST 1 VIEW COMPARISON:  Chest x-ray 08/13/2019. FINDINGS: An endotracheal tube is in place with tip 4.9 cm above the carina. Right IJ Cordis through which a Swan-Ganz catheter has been passed into the right pulmonary artery. Bilateral chest tubes appear similarly position with tips projecting over the mid to  upper thorax bilaterally. A feeding tube is seen extending into the abdomen, however, the tip of the feeding tube extends below the lower margin of the image. Midline mediastinal drain. Left ventricular assist device (Impella) via right subclavian approach. Midline skin staples and skin staples projecting over the right axillary region. Lung volumes are slightly low. Bibasilar opacities may reflect areas of atelectasis and/or consolidation. Small bilateral pleural effusions. Mild diffuse interstitial prominence. Pulmonary vasculature does not appear engorged. Mild cardiomegaly. Upper mediastinal contours are within normal limits. Status post median sternotomy for CABG. IMPRESSION: 1. Support apparatus and postoperative changes, as above. 2. Low lung volumes with bibasilar areas of atelectasis and/or consolidation and small bilateral pleural effusions. Diffuse interstitial prominence, similar to the prior study. 3. Mild cardiomegaly. Electronically Signed   By: Trudie Reed M.D.   On: 08/13/2019 11:20   DG Chest Port 1 View  Result Date: 08/13/2019 CLINICAL DATA:  Endotracheal tube.  LVAD. EXAM: PORTABLE CHEST 1 VIEW COMPARISON:  08/12/2019 FINDINGS: Endotracheal tube is in place, tip 3.3 centimeters above the carina. Feeding tube and nasogastric tube are in place, extending to the level of the stomach. LEFT ventricular assist device present unchanged in position. Status post median sternotomy and CABG. RIGHT Swan-Ganz catheter tip overlies the RIGHT pulmonary outflow tract. A RIGHT-sided PICC line tip overlies the superior vena cava. Stable cardiomegaly. Stable bibasilar opacities partially obscuring hemidiaphragms. Stable mild interstitial edema over recent studies. IMPRESSION: 1. Stable appearance cardiomegaly and mild pulmonary edema, bibasilar opacities. 2. Lines and tubes as described. Electronically Signed   By: Norva Pavlov M.D.   On: 08/13/2019 10:17   DG Chest Port 1 View  Result Date:  08/12/2019 CLINICAL DATA:  64 year old male admitted 07/26/2019, status post CABG 08/02/2019, emergent median sternotomy and cardiac massage for arrest 08/03/2019, ECMO initiation 08/03/2019 decannulation 08/06/2019, Impella placement 08/06/2019, mediastinal exploration 08/07/2019, with 4 chest tubes/mediastinal drains and open chest EXAM: PORTABLE CHEST 1 VIEW COMPARISON:  Multiple prior most recent 08/11/2019 FINDINGS: Cardiomediastinal silhouette is unchanged. Surgical changes of median sternotomy, now with intact sternal wires, CABG. Impella device from right axillary approach, unchanged. Endotracheal tube unchanged terminating 7.4 cm above the carina at the level of the clavicular heads. Unchanged right IJ sheath transmits Swan-Ganz catheter which terminates in the right hilar pulmonary arteries. Unchanged pleural/mediastinal drains and epicardial pacing leads. Unchanged gastric tube terminating within the left upper quadrant. Unchanged enteric feeding tube which terminates out of the field of view. Interval removal of the defibrillator pads. Low lung  volumes persist, as well as persisting opacity at the right lung base partially opacifying the right hemidiaphragm. Reticular opacities bilateral lungs. No pneumothorax. Blunting of the left costophrenic angle. IMPRESSION: Interval removal of defibrillator pads. Surgical changes of median sternotomy and CABG now with intact sternal wires. Similar appearance of low lung volumes with persisting right basilar opacity representing pleural fluid and atelectasis/consolidation. With the exception of the defibrillator pads, other surgical support apparatus unchanged as above. Electronically Signed   By: Gilmer Mor D.O.   On: 08/12/2019 08:18   DG CHEST PORT 1 VIEW  Result Date: 08/11/2019 CLINICAL DATA:  Cardiogenic shock. EXAM: PORTABLE CHEST 1 VIEW COMPARISON:  Radiograph yesterday. FINDINGS: Endotracheal tube tip at the clavicular heads. Enteric tube tip below the  diaphragm in the stomach. Weighted enteric tube with tip in the right upper quadrant of the abdomen. Impella from a right upper extremity approach unchanged in position. Right internal jugular Swan-Ganz catheter tip in the region of the right pulmonary outflow tract. Bilateral chest tubes and presumed mediastinal drain in place. Stable heart size and mediastinal contours. Hazy opacity at both lung bases consistent with pleural effusions and atelectasis. Similar pulmonary edema. New median sternotomy and midline skin staples. No visualized pneumothorax. IMPRESSION: 1. Stable support apparatus as described. 2. Unchanged pulmonary edema, bilateral pleural effusions and bibasilar atelectasis. No visualized pneumothorax. 3. New sternotomy wires since yesterday with midline skin staples. Electronically Signed   By: Narda Rutherford M.D.   On: 08/11/2019 02:02   DG CHEST PORT 1 VIEW  Result Date: 08/10/2019 CLINICAL DATA:  Bypass surgery. Impella device in place. EXAM: PORTABLE CHEST 1 VIEW COMPARISON:  08/09/2019 FINDINGS: The endotracheal tube, NG tube, Swan-Ganz catheter, chest tubes and mediastinal drain tube are stable. The Impella device is in good position, unchanged. External pacer paddles are noted. Persistent mild interstitial edema, bibasilar atelectasis and probable small right effusion. No pneumothorax. IMPRESSION: 1. Stable support apparatus. 2. Stable edema, bibasilar atelectasis and probable small right effusion. Electronically Signed   By: Rudie Meyer M.D.   On: 08/10/2019 07:38   DG CHEST PORT 1 VIEW  Result Date: 08/08/2019 CLINICAL DATA:  LVAD EXAM: PORTABLE CHEST 1 VIEW COMPARISON:  Yesterday FINDINGS: Endotracheal tube with tip between the clavicular heads and carina. Enteric tube reaches the stomach. Impella from the right in unchanged position. Swan-Ganz catheter from the right IJ with tip at the right hilum. Chest tubes are present. Vascular congestion and hazy opacities at the right more  the left base. No visible pneumothorax. Open sternum. IMPRESSION: Stable hardware positioning and pulmonary opacification. No visible pneumothorax. Electronically Signed   By: Marnee Spring M.D.   On: 08/08/2019 08:24   DG Chest Port 1 View  Result Date: 08/07/2019 CLINICAL DATA:  Status post chest wound exploration EXAM: PORTABLE CHEST 1 VIEW COMPARISON:  08/07/2019 FINDINGS: Endotracheal tube 4.5 cm above the carina. Nasogastric tube projecting over the stomach. Impella device in satisfactory unchanged position. Bilateral chest tubes. Swan-Ganz catheter tip projecting over the right ventricular outflow tract. Bilateral chest tubes in unchanged position. No pneumothorax. Bilateral diffuse interstitial thickening. Trace right pleural effusion. Stable cardiomegaly. Prior CABG. IMPRESSION: 1. Support lines and tubes in satisfactory position. 2. Mild pulmonary edema. Trace right pleural effusion. Electronically Signed   By: Elige Ko   On: 08/07/2019 13:08   DG CHEST PORT 1 VIEW  Result Date: 08/06/2019 CLINICAL DATA:  Status post endotracheal tube placement EXAM: PORTABLE CHEST 1 VIEW COMPARISON:  June 28 21 FINDINGS: An  Impella device is noted projecting over the heart. The Swan-Ganz catheter tip projects over the main right pulmonary artery. The endotracheal tube terminates above the carina by approximately 4.7 cm. The enteric tube extends below the left hemidiaphragm. A left-sided chest tube is noted. There is no definite pneumothorax. There are bilateral pleural effusions, right greater than left. The heart size remains enlarged. The patient is status post prior median sternotomy. There is vascular congestion with mild pulmonary edema. IMPRESSION: 1. Support devices as above. 2. Bilateral pleural effusions with interstitial edema. 3. No significant pneumothorax. Electronically Signed   By: Katherine Mantle M.D.   On: 08/06/2019 20:26   DG Chest Port 1 View  Result Date: 08/05/2019 CLINICAL DATA:   Follow-up for ARDS. EXAM: PORTABLE CHEST 1 VIEW COMPARISON:  08/04/2019 FINDINGS: Support apparatus including endotracheal tube, nasogastric tube, mediastinal tube, right internal jugular Swan-Ganz catheter, left chest tube and ECMO device are all stable. Lungs demonstrate bilateral vascular congestion and interstitial prominence, as well as opacity at the left lung base, the latter finding likely atelectasis, stable from the previous day's exam. Small left and probable small right pleural effusions. No pneumothorax. No mediastinal widening. Cardiac silhouette is normal in size. IMPRESSION: 1. Stable appearance of the lungs from the previous day's exam with vascular congestion and interstitial thickening suggesting a component of interstitial edema. More confluent opacity at the left lung base is most likely atelectasis. Small left and suspected small right pleural effusions. No new lung abnormalities. 2. No pneumothorax. 3. Stable support apparatus. Electronically Signed   By: Amie Portland M.D.   On: 08/05/2019 08:32   DG Chest Port 1 View in am  Result Date: 08/04/2019 CLINICAL DATA:  ECMO and history of CABG. EXAM: PORTABLE CHEST 1 VIEW COMPARISON:  08/03/2019. FINDINGS: Again noted are ECMO catheters overlying the chest. Right jugular central line with a Swan-Ganz catheter is present. Theone Murdoch catheter tip is in the distal right pulmonary artery region and unchanged. Evidence for bilateral chest tubes. Endotracheal tube is appropriately positioned above the carina. Low lung volumes with diffuse interstitial densities likely representing some pulmonary edema. Overall, lung low lung volumes. Cannot exclude increased densities in the left apex and medial left chest. Nasogastric tube extends into the abdomen. Negative for a pneumothorax. IMPRESSION: 1. Low lung volumes and evidence for interstitial edema. Cannot exclude increased densities in the left apical region. 2. Support apparatuses as described.  Bilateral chest tubes without a pneumothorax. Electronically Signed   By: Richarda Overlie M.D.   On: 08/04/2019 09:05   DG Chest Port 1 View  Result Date: 08/03/2019 CLINICAL DATA:  Status post ECMO and CABG EXAM: PORTABLE CHEST 1 VIEW COMPARISON:  Chest radiograph from earlier today. FINDINGS: Endotracheal tube tip is 3.7 cm above the carina. Enteric tube enters stomach with the tip not seen on this image. Right internal jugular Swan-Ganz catheter terminates over the right pulmonary artery. Inferior approach mediastinal drain terminates over midline upper mediastinum. Stable position of ECMO catheters overlying the upper mediastinum. Stable bilateral chest tube position. Pacer pad overlies the left chest. Stable cardiomediastinal silhouette with mild cardio. No pneumothorax. No pleural effusion. Moderate to severe pulmonary edema appears unchanged. Stable left basilar atelectasis. IMPRESSION: 1. Well-positioned support structures.  No pneumothorax. 2. Stable mild cardiomegaly and moderate to severe pulmonary edema. 3. Stable left basilar atelectasis. Electronically Signed   By: Delbert Phenix M.D.   On: 08/03/2019 10:03   DG Chest Port 1 View  Result Date: 08/03/2019 CLINICAL DATA:  Status post cardiac surgery. EXAM: PORTABLE CHEST 1 VIEW COMPARISON:  August 02, 2019. FINDINGS: Stable cardiomegaly. Endotracheal and nasogastric tubes have been removed. Right internal jugular Swan-Ganz catheter is unchanged. Status post coronary bypass graft. Bilateral chest tubes are noted without pneumothorax. Mild left basilar atelectasis is noted with small left pleural effusion. Bony thorax is unremarkable. IMPRESSION: Bilateral chest tubes are noted without pneumothorax. Mild left basilar atelectasis is noted with small left pleural effusion. Electronically Signed   By: Lupita Raider M.D.   On: 08/03/2019 08:14   DG Chest Port 1 View  Result Date: 08/02/2019 CLINICAL DATA:  Postoperative CABG EXAM: PORTABLE CHEST 1 VIEW  COMPARISON:  07/26/2019 FINDINGS: Endotracheal tube terminates 4.6 cm above the carina. Enteric tube courses below the diaphragm with distal tip beyond the inferior margin of the film. Right IJ approach Swan-Ganz catheter terminates in the region of the pulmonary trunk. Bilateral chest tubes in place. Stable heart size. No focal airspace consolidation. No large pleural fluid collection. No pneumothorax is identified. IMPRESSION: Status post CABG with support lines and tubes in satisfactory position. No pneumothorax. Electronically Signed   By: Duanne Guess D.O.   On: 08/02/2019 16:08   DG Abd Portable 1V  Result Date: 08/13/2019 CLINICAL DATA:  Feeding tube advanced.  Check tube placement. EXAM: PORTABLE ABDOMEN - 1 VIEW COMPARISON:  08/10/2019 FINDINGS: Feeding tube has been advanced, tip in the region of the pylorus. A loop of tube extends into the gastric fundus. Nasogastric tube is in place, tip overlying the level of the stomach. Swan-Ganz catheter, LVAD again noted. There is paucity of bowel gas. No evidence for intraperitoneal air. IMPRESSION: Feeding tube tip in the region of the pylorus. Electronically Signed   By: Norva Pavlov M.D.   On: 08/13/2019 10:17   DG Abd Portable 1V  Result Date: 08/10/2019 CLINICAL DATA:  Cortrak placement EXAM: PORTABLE ABDOMEN - 1 VIEW COMPARISON:  08/06/2019 FINDINGS: Interval placement of enteric feeding tube, tip positioned near the pylorus although within the stomach. Esophagogastric tube with tip and side port below the diaphragm. General paucity of bowel gas. No obvious free air on supine radiograph. IMPRESSION: Interval placement of enteric feeding tube, tip positioned near the pylorus although within the stomach. Recommend advancement if post pyloric positioning is desired. Electronically Signed   By: Lauralyn Primes M.D.   On: 08/10/2019 16:10   DG Abd Portable 1V  Result Date: 08/06/2019 CLINICAL DATA:  OG tube placement. EXAM: PORTABLE ABDOMEN - 1 VIEW  COMPARISON:  None. FINDINGS: The OG tube projects over the gastric body. The bowel gas pattern is nonobstructive and nonspecific. IMPRESSION: OG tube projects over the gastric body. Electronically Signed   By: Katherine Mantle M.D.   On: 08/06/2019 20:27   EEG adult  Result Date: 08/21/2019 Charlsie Quest, MD     08/21/2019 10:37 AM Patient Name: Dwyane Dupree MRN: [id number] Epilepsy Attending: Charlsie Quest Referring Physician/Provider: Dr Levon Hedger Date: 08/21/2019 Duration: 25.38 mins Patient history: 64yo M who presented with cardiogenic/hemorrhagic shock status post CABG complicated by VT arrest , continues to be encephalopathic. EEG to evaluate for seizure Level of alertness: lethargic AEDs during EEG study: None Technical aspects: This EEG study was done with scalp electrodes positioned according to the 10-20 International system of electrode placement. Electrical activity was acquired at a sampling rate of 500Hz  and reviewed with a high frequency filter of 70Hz  and a low frequency filter of 1Hz . EEG data were recorded continuously and digitally  stored. Description: No posterior dominant rhythm was seen. EEG showed continuous generalized polymorphic mixed frequencies with predominantly 5-9 Hz theta-alpha activity as well as intermittent 2-3hz  delta slowing.  Hyperventilation and photic stimulation were not performed.   ABNORMALITY -Continuous slow, generalized IMPRESSION: This study is suggestive of moderate to severe diffuse encephalopathy, nonspecific etiology. No seizures or epileptiform discharges were seen throughout the recording. Charlsie Quest   DG C-Arm 1-60 Min-No Report  Result Date: 08/06/2019 Fluoroscopy was utilized by the requesting physician.  No radiographic interpretation.   ECHOCARDIOGRAM COMPLETE  Result Date: 07/27/2019    ECHOCARDIOGRAM REPORT   Patient Name:   DELIO SLATES Date of Exam: 07/27/2019 Medical Rec #:  [id number]  Height:       63.0 in Accession #:     [id number] Weight:       169.8 lb Date of Birth:  1955/12/24 BSA:          1.803 m Patient Age:    63 years   BP:           114/75 mmHg Patient Gender: M          HR:           99 bpm. Exam Location:  Inpatient Procedure: 2D Echo, 3D Echo, Color Doppler and Cardiac Doppler Indications:    I50.20* Unspecified systolic (congestive) heart failure  History:        Patient has no prior history of Echocardiogram examinations. CHF                 and Systolic heart failure; Risk Factors:Hypertension and                 Dyslipidemia. Patient had echo 4 days ago at another facility.  Sonographer:    Sheralyn Boatman RDCS Referring Phys: [id number] Beatriz Stallion IMPRESSIONS  1. There is global left ventricular hypokinesis, with distinct regional variations. There is akinesis and hyperechogenicity of the entire inferior septum and inferior wall (consistent with scar in the distribution of the right coronary artery). There is  severe hypokinesis of the apex and mid-apical anterior septum, consistent with severe ischemia or infarction in the distribution of the mid-LAD artery. No intracavitary thrombus is seen. Left ventricular ejection fraction, by estimation, is 20 to 25%. The left ventricle has severely decreased function. The left ventricular internal cavity size was mildly dilated. Left ventricular diastolic parameters are consistent with Grade I diastolic dysfunction (impaired relaxation). There is incessant ectopy that limits diastolic function evaluation.  2. Right ventricular systolic function is moderately reduced. The right ventricular size is normal. There is normal pulmonary artery systolic pressure.  3. Left atrial size was mildly dilated.  4. The pericardial effusion is circumferential. There is no evidence of cardiac tamponade. Large pleural effusion in the left lateral region.  5. The mitral valve is normal in structure. Mild to moderate mitral valve regurgitation.  6. The aortic valve is tricuspid. Aortic valve  regurgitation is not visualized. Mild aortic valve sclerosis is present, with no evidence of aortic valve stenosis.  7. The inferior vena cava is dilated in size with >50% respiratory variability, suggesting right atrial pressure of 8 mmHg. FINDINGS  Left Ventricle: There is global left ventricular hypokinesis, with distinct regional variations. There is akinesis and hyperechogenicity of the entire inferior septum and inferior wall (consistent with scar in the distribution of the right coronary artery). There is severe hypokinesis of the apex and mid-apical anterior septum, consistent with severe ischemia or infarction  in the distribution of the mid-LAD artery. No intracavitary thrombus is seen. Left ventricular ejection fraction, by estimation, is 20 to 25%. The left ventricle has severely decreased function. The left ventricle demonstrates regional wall motion abnormalities. The left ventricular internal cavity size was mildly dilated. There is no left ventricular hypertrophy. Left  ventricular diastolic parameters are consistent with Grade I diastolic dysfunction (impaired relaxation). Normal left ventricular filling pressure. Right Ventricle: The right ventricular size is normal. No increase in right ventricular wall thickness. Right ventricular systolic function is moderately reduced. There is normal pulmonary artery systolic pressure. The tricuspid regurgitant velocity is 1.88 m/s, and with an assumed right atrial pressure of 8 mmHg, the estimated right ventricular systolic pressure is 22.1 mmHg. Left Atrium: Left atrial size was mildly dilated. Right Atrium: Right atrial size was normal in size. Pericardium: A small pericardial effusion is present. The pericardial effusion is circumferential. There is no evidence of cardiac tamponade. Mitral Valve: The mitral valve is normal in structure. Mild mitral annular calcification. Mild to moderate mitral valve regurgitation, with centrally-directed jet. Tricuspid  Valve: The tricuspid valve is normal in structure. Tricuspid valve regurgitation is not demonstrated. Aortic Valve: The aortic valve is tricuspid. Aortic valve regurgitation is not visualized. Mild aortic valve sclerosis is present, with no evidence of aortic valve stenosis. Pulmonic Valve: The pulmonic valve was grossly normal. Pulmonic valve regurgitation is not visualized. Aorta: The aortic root and ascending aorta are structurally normal, with no evidence of dilitation. Venous: The inferior vena cava is dilated in size with greater than 50% respiratory variability, suggesting right atrial pressure of 8 mmHg. IAS/Shunts: No atrial level shunt detected by color flow Doppler. Additional Comments: There is a large pleural effusion in the left lateral region.  LEFT VENTRICLE PLAX 2D LVIDd:         5.90 cm      Diastology LVIDs:         5.30 cm      LV e' lateral:   9.68 cm/s LV PW:         0.80 cm      LV E/e' lateral: 9.8 LV IVS:        1.40 cm LVOT diam:     2.00 cm LV SV:         51 LV SV Index:   28 LVOT Area:     3.14 cm  LV Volumes (MOD) LV vol d, MOD A2C: 190.5 ml LV vol d, MOD A4C: 105.0 ml LV vol s, MOD A2C: 126.0 ml LV vol s, MOD A4C: 98.6 ml LV SV MOD A2C:     64.5 ml LV SV MOD A4C:     105.0 ml LV SV MOD BP:      30.1 ml RIGHT VENTRICLE            IVC RV S prime:     7.28 cm/s  IVC diam: 2.10 cm TAPSE (M-mode): 1.1 cm LEFT ATRIUM             Index       RIGHT ATRIUM           Index LA diam:        3.90 cm 2.16 cm/m  RA Area:     12.30 cm LA Vol (A2C):   52.9 ml 29.33 ml/m RA Volume:   30.10 ml  16.69 ml/m LA Vol (A4C):   35.7 ml 19.80 ml/m LA Biplane Vol: 42.4 ml 23.51 ml/m  AORTIC VALVE LVOT Vmax:  109.00 cm/s LVOT Vmean:  67.500 cm/s LVOT VTI:    0.161 m  AORTA Ao Root diam: 3.20 cm Ao Asc diam:  2.80 cm MITRAL VALVE                TRICUSPID VALVE MV Area (PHT): 4.19 cm     TR Peak grad:   14.1 mmHg MV Decel Time: 181 msec     TR Vmax:        188.00 cm/s MV E velocity: 95.10 cm/s MV A velocity:  119.00 cm/s  SHUNTS MV E/A ratio:  0.80         Systemic VTI:  0.16 m                             Systemic Diam: 2.00 cm Rachelle Hora Croitoru MD Electronically signed by Thurmon Fair MD Signature Date/Time: 07/27/2019/3:38:30 PM    Final    ECHO INTRAOPERATIVE TEE  Result Date: 08/03/2019  *INTRAOPERATIVE TRANSESOPHAGEAL REPORT *  Patient Name:   LEONCE BALE     Date of Exam: 08/03/2019 Medical Rec #:  [id number]      Height:       69.0 in Accession #:    [id number]     Weight:       161.6 lb Date of Birth:  1955-09-23     BSA:          1.89 m Patient Age:    63 years       BP:           96/68 mmHg Patient Gender: M              HR:           101 bpm. Exam Location:  Anesthesiology Transesophogeal exam was perform intraoperatively during surgical procedure. Patient was closely monitored under general anesthesia during the entirety of examination. Indications:     CAD, Vfib arrest Sonographer:     Lavenia Atlas RDCS Performing Phys: [id number] CLARENCE H OWEN Diagnosing Phys: Jairo Ben MD Complications: No known complications during this procedure. POST-OP IMPRESSIONS - Left Ventricle: The left ventricle is unchanged from pre-op. Global hypokinesis persists, with EF 15-20% - Aortic Valve: The aortic valve appears unchanged from pre-op images. - Mitral Valve: The mitral valve appears essentially unchanged from pre-op. There is only mild regurgitation. PRE-OP FINDINGS  Left Ventricle: The left ventricle has severely reduced systolic function, with an ejection fraction of 15-20%, calculated 20%. The cavity size was severely dilated. There is no increase in left ventricular wall thickness. 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 There is the interventricular septum is flattened in systole and diastole, consistent with right ventricular pressure and volume overload. Left ventricular diffuse hypokinesis, with inferoseptal akinesis. Right Ventricle: The right ventricle has moderately reduced systolic function.  The cavity was normal. There is no increase in right ventricular wall thickness. Left Atrium: Left atrial size was mildly dilated. The left atrial appendage is well visualized and there is no evidence of thrombus present. Left atrial appendage velocity is normal at greater than 40 cm/s. Right Atrium: Right atrial size was normal in size. Right atrial pressure is estimated at 10 mmHg. Interatrial Septum: No atrial level shunt detected by color flow Doppler. Pericardium: The pericardium was not assessed. Mitral Valve: The mitral valve is normal in structure. No thickening of the mitral valve leaflet. No calcification of the mitral valve leaflet. Mitral valve regurgitation is moderate by color flow  Doppler. The MR jet is posteriorly-directed. There is no evidence of mitral valve vegetation. Pulmonary venous flow is blunted (decreased). There is No evidence of mitral stenosis. Tricuspid Valve: The tricuspid valve was normal in structure. Tricuspid valve regurgitation is mild-moderate by color flow Doppler. The jet is directed centrally. There is no evidence of tricuspid valve vegetation. Aortic Valve: The aortic valve is tricuspid Aortic valve regurgitation is trivial by color flow Doppler. There is no evidence of aortic valve stenosis. There is no evidence of a vegetation on the aortic valve. Pulmonic Valve: The pulmonic valve was normal in structure, with normal leaflet excursion. There is no pulmonic stenosis. Pulmonic valve regurgitation is trivial, around the PA catheter, by color flow Doppler. Aorta: There is mild calcification of the aortic annulus. There is evidence of scattered plaque in the descending aorta; Grade II, measuring 2-81mm in size. Pulmonary Artery: The pulmonary artery is mildly dilated. Venous: The inferior vena cava is normal in size with less than 50% respiratory variability, suggesting right atrial pressure of > 8 mmHg.  Jairo Ben MD Electronically signed by Jairo Ben MD Signature  Date/Time: 08/03/2019/3:41:13 PM    Final    ECHO INTRAOPERATIVE TEE  Result Date: 08/02/2019  *INTRAOPERATIVE TRANSESOPHAGEAL REPORT *  Patient Name:   CHRISTAN DEFRANCO     Date of Exam: 08/02/2019 Medical Rec #:  [id number]      Height:       69.0 in Accession #:    [id number]     Weight:       161.6 lb Date of Birth:  05/10/1955     BSA:          1.89 m Patient Age:    63 years       BP:           104/54 mmHg Patient Gender: M              HR:           70 bpm. Exam Location:  Anesthesiology Transesophogeal exam was perform intraoperatively during surgical procedure. Patient was closely monitored under general anesthesia during the entirety of examination. Indications:     Coronary artery disease Performing Phys: Karna Christmas Diagnosing Phys: Karna Christmas MD Complications: No known complications during this procedure. POST-OP IMPRESSIONS . Left Ventricle: The ejection fraction is slightly improved. - Right Ventricle: The right ventricle appears unchanged from pre-bypass. - Aorta: The aorta appears unchanged from pre-bypass. - Left Atrium: The left atrium appears unchanged from pre-bypass. - Left Atrial Appendage: The left atrial appendage appears unchanged from pre-bypass. - Aortic Valve: There is trace regurgitation. - Mitral Valve: The mitral valve appears unchanged from pre-bypass. The regurgitation is slightly increased. - Tricuspid Valve: The tricuspid valve appears unchanged from pre-bypass. - Interatrial Septum: The interatrial septum appears unchanged from pre-bypass. - Interventricular Septum: The interventricular septum appears unchanged from pre-bypass. - Pericardium: The pericardial effusion is absent. PRE-OP FINDINGS  Left Ventricle: The left ventricle has severely reduced systolic function, with an ejection fraction of 25-30%. The cavity size was normal. There is no increase in left ventricular wall thickness. Right Ventricle: The right ventricle has mildly reduced systolic function. The cavity was  normal. There is no increase in right ventricular wall thickness. Left Atrium: Left atrial size was normal in size. The left atrial appendage is well visualized and there is no evidence of thrombus present. Right Atrium: Right atrial size was normal in size. Interatrial Septum: No atrial level shunt detected by color  flow Doppler. Pericardium: A moderately sized pericardial effusion is presentn the right side. Mitral Valve: The mitral valve is normal in structure. Mitral valve regurgitation is moderate by color flow Doppler. Tricuspid Valve: The tricuspid valve was normal in structure. Tricuspid valve regurgitation is trivial by color flow Doppler. Aortic Valve: The aortic valve is tricuspid. There is mild calcification of the aortic valve. Aortic valve regurgitation was not visualized by color flow Doppler. There is no evidence of aortic valve stenosis. Pulmonic Valve: The pulmonic valve was normal in structure. Pulmonic valve regurgitation is trivial by color flow Doppler. Aorta: The aortic root, ascending aorta and aortic arch are normal in size and structure. There is evidence of plaque in the descending aorta.  Karna Christmas MD Electronically signed by Karna Christmas MD Signature Date/Time: 08/02/2019/3:27:43 PM    Final    Doppler Pre CABG  Result Date: 08/01/2019 PREOPERATIVE VASCULAR EVALUATION  Indications:            Pre-CABG. Risk Factors:           Hypertension, hyperlipidemia, Diabetes, coronary artery                         disease, PAD. Vascular Interventions: Prior bilateral CEA. Comparison Study:       none Performing Technologist: Jeb Levering Rvt, Rdms  Examination Guidelines: A complete evaluation includes B-mode imaging, spectral Doppler, color Doppler, and power Doppler as needed of all accessible portions of each vessel. Bilateral testing is considered an integral part of a complete examination. Limited examinations for reoccurring indications may be performed as noted.  Right Carotid  Findings: +----------+--------+-------+--------+----------------------+------------------+           PSV cm/sEDV    StenosisDescribe              Comments                             cm/s                                                    +----------+--------+-------+--------+----------------------+------------------+ CCA Prox  58      12                                                      +----------+--------+-------+--------+----------------------+------------------+ CCA Distal66      17                                                      +----------+--------+-------+--------+----------------------+------------------+ ICA Prox  89      44     1-39%   heterogenous and      upper end of range                                  smooth                                   +----------+--------+-------+--------+----------------------+------------------+  ICA Distal74      33                                                      +----------+--------+-------+--------+----------------------+------------------+ ECA       74      9                                                       +----------+--------+-------+--------+----------------------+------------------+ Portions of this table do not appear on this page. +----------+--------+-------+----------------+------------+           PSV cm/sEDV cmsDescribe        Arm Pressure +----------+--------+-------+----------------+------------+ [id number]             Multiphasic, WNL             +----------+--------+-------+----------------+------------+ +---------+--------+--+--------+-+---------+ VertebralPSV cm/s21EDV cm/s8Antegrade +---------+--------+--+--------+-+---------+ Left Carotid Findings: +----------+--------+-------+--------+----------------------+------------------+           PSV cm/sEDV    StenosisDescribe              Comments                             cm/s                                                     +----------+--------+-------+--------+----------------------+------------------+ CCA Prox  75      19             heterogenous and      47% diam reduction                                  smooth                                   +----------+--------+-------+--------+----------------------+------------------+ CCA Distal109     26                                                      +----------+--------+-------+--------+----------------------+------------------+ ICA Prox  109     26     1-39%   heterogenous                             +----------+--------+-------+--------+----------------------+------------------+ ICA Distal52      15                                                      +----------+--------+-------+--------+----------------------+------------------+ ECA       64      8                                                       +----------+--------+-------+--------+----------------------+------------------+ +----------+--------+--------+----------------+------------+  SubclavianPSV cm/sEDV cm/sDescribe        Arm Pressure +----------+--------+--------+----------------+------------+           93              Multiphasic, WNL             +----------+--------+--------+----------------+------------+ +---------+--------+--+--------+--+---------+ VertebralPSV cm/s58EDV cm/s21Antegrade +---------+--------+--+--------+--+---------+  ABI Findings: +--------+------------------+-----+----------+--------+ Right   Rt Pressure (mmHg)IndexWaveform  Comment  +--------+------------------+-----+----------+--------+ [id number]                    triphasic          +--------+------------------+-----+----------+--------+ ATA     75                0.60 monophasic         +--------+------------------+-----+----------+--------+ PTA                            absent              +--------+------------------+-----+----------+--------+ +--------+------------------+-----+----------+-------+ Left    Lt Pressure (mmHg)IndexWaveform  Comment +--------+------------------+-----+----------+-------+ [id number]                    triphasic         +--------+------------------+-----+----------+-------+ ATA     84                0.68 monophasic        +--------+------------------+-----+----------+-------+ PTA     73                0.59 monophasic        +--------+------------------+-----+----------+-------+  Right Doppler Findings: +--------+--------+-----+---------+--------+ Site    PressureIndexDoppler  Comments +--------+--------+-----+---------+--------+ [id number]          triphasic         +--------+--------+-----+---------+--------+ Radial               triphasic         +--------+--------+-----+---------+--------+ Ulnar                triphasic         +--------+--------+-----+---------+--------+  Left Doppler Findings: +--------+--------+-----+---------+--------+ Site    PressureIndexDoppler  Comments +--------+--------+-----+---------+--------+ [id number]          triphasic         +--------+--------+-----+---------+--------+ Radial               triphasic         +--------+--------+-----+---------+--------+ Ulnar                triphasic         +--------+--------+-----+---------+--------+  Summary: Right Carotid: Velocities in the right ICA are consistent with a 1-39% stenosis. Left Carotid: Velocities in the left ICA are consistent with a 1-39% stenosis. Right ABI: Resting right ankle-brachial index indicates moderate right lower extremity arterial disease. Left ABI: Resting left ankle-brachial index indicates moderate left lower extremity arterial disease.  Bilateral Extremity: Doppler waveforms remain within normal limits with compression bilaterally for the radial arteries. Doppler waveforms remain within normal  limits with compression bilaterally for the ulnar arteries.  Electronically signed by Gretta Began MD on 08/01/2019 at 3:45:56 PM.    Final    VAS [place] LOWER EXTREMITY VENOUS (DVT)  Result Date: 08/18/2019  Lower Venous DVTStudy Indications: Edema.  Limitations: Bandages, line and ventilation. Comparison Study: No prior study on file Performing Technologist: Sherren Kerns RVS  Examination Guidelines: A complete evaluation includes B-mode imaging, spectral Doppler, color Doppler, and power  Doppler as needed of all accessible portions of each vessel. Bilateral testing is considered an integral part of a complete examination. Limited examinations for reoccurring indications may be performed as noted. The reflux portion of the exam is performed with the patient in reverse Trendelenburg.  +---------+---------------+---------+-----------+----------+-------------------+ RIGHT    CompressibilityPhasicitySpontaneityPropertiesThrombus Aging      +---------+---------------+---------+-----------+----------+-------------------+ CFV                                                   Not visualized      +---------+---------------+---------+-----------+----------+-------------------+ SFJ                                                   Not visualized      +---------+---------------+---------+-----------+----------+-------------------+ FV Prox  Full           Yes      Yes                                      +---------+---------------+---------+-----------+----------+-------------------+ FV Mid   Full                                                             +---------+---------------+---------+-----------+----------+-------------------+ FV DistalFull                                                             +---------+---------------+---------+-----------+----------+-------------------+ POP                     Yes      Yes                  patent by color and                                                        Doppler             +---------+---------------+---------+-----------+----------+-------------------+ PTV      Full                                                             +---------+---------------+---------+-----------+----------+-------------------+ PERO     Full                                                             +---------+---------------+---------+-----------+----------+-------------------+   +---------+---------------+---------+-----------+----------+-------------------+  LEFT     CompressibilityPhasicitySpontaneityPropertiesThrombus Aging      +---------+---------------+---------+-----------+----------+-------------------+ CFV                                                   Not visualized      +---------+---------------+---------+-----------+----------+-------------------+ SFJ                                                   Not visualized      +---------+---------------+---------+-----------+----------+-------------------+ FV Prox                                               patent by color and                                                       Doppler             +---------+---------------+---------+-----------+----------+-------------------+ FV Mid                                                Not visualized      +---------+---------------+---------+-----------+----------+-------------------+ FV Distal                                             patent by color and                                                       Doppler             +---------+---------------+---------+-----------+----------+-------------------+ PFV                                                   Not visualized      +---------+---------------+---------+-----------+----------+-------------------+ POP                     Yes      Yes                  patent by color and                                                        Doppler             +---------+---------------+---------+-----------+----------+-------------------+  PTV      Full                                                             +---------+---------------+---------+-----------+----------+-------------------+ PERO     Full                                                             +---------+---------------+---------+-----------+----------+-------------------+    Summary: RIGHT: - There is no evidence of deep vein thrombosis in the lower extremity. However, portions of this examination were limited- see technologist comments above.  interstitial edema noted throughout  LEFT: - There is no evidence of deep vein thrombosis in the lower extremity. However, portions of this examination were limited- see technologist comments above.  Interstitial edema noted throughout.  *See table(s) above for measurements and observations. Electronically signed by Coral Else MD on 08/18/2019 at 6:02:04 PM.    Final    ECHOCARDIOGRAM LIMITED  Result Date: 08/15/2019    ECHOCARDIOGRAM LIMITED REPORT   Patient Name:   LUCIANO CINQUEMANI Date of Exam: 08/15/2019 Medical Rec #:  [id number]  Height:       69.0 in Accession #:    [id number] Weight:       179.5 lb Date of Birth:  1955-03-30 BSA:          1.973 m Patient Age:    63 years   BP:           94/64 mmHg Patient Gender: M          HR:           89 bpm. 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 CHF, CAD, Prior CABG; Risk                 Factors:Hypertension, Dyslipidemia and Diabetes.  Sonographer:    Leta Jungling RDCS Referring Phys: [id number] [facility]  Sonographer Comments: Limited for Impella check IMPRESSIONS  1. Impellar device about 5.1 cm distal to the aortic annulus Severe decrease in LV function. FINDINGS   Additional Comments: Impellar device about 5.1 cm distal to the aortic annulus Severe decrease in LV function. Charlton Haws MD Electronically signed by Charlton Haws MD Signature Date/Time: 08/15/2019/10:05:45 AM    Final    ECHOCARDIOGRAM LIMITED  Result Date: 08/13/2019    ECHOCARDIOGRAM LIMITED REPORT   Patient Name:   DELANEY SCHNICK Date of Exam: 08/13/2019 Medical Rec #:  [id number]  Height:       69.0 in Accession #:    [id number] Weight:       184.3 lb Date of Birth:  13-Dec-1955 BSA:          1.995 m Patient Age:    63 years   BP:           113/61 mmHg Patient Gender: M  HR:           75 bpm. Exam Location:  Inpatient Procedure: Limited Echo Indications:    Impella placement  History:        Patient has prior history of Echocardiogram examinations, most                 recent 08/11/2019. CHF, CAD; Risk Factors:Diabetes, Dyslipidemia,                 Hypertension and Current Smoker.  Sonographer:    Renella Cunas RDCS Referring Phys: [id number] DALTON S MCLEAN IMPRESSIONS  1. Limited study to assess Impella placement. There is severe LV dysfunction with septal dyskinesis and apical akinesis. Impella is stable in position, 5.02 cm from aortic valve. FINDINGS  Left Ventricle: Limited study to assess Impella placement. There is severe LV dysfunction with septal dyskinesis and apical akinesis. Impella is stable in position, 5.02 cm from aortic valve. Nona Dell MD Electronically signed by Nona Dell MD Signature Date/Time: 08/13/2019/11:38:58 AM    Final    ECHOCARDIOGRAM LIMITED  Result Date: 08/11/2019    ECHOCARDIOGRAM LIMITED REPORT   Patient Name:   NEEKO PHARO Date of Exam: 08/11/2019 Medical Rec #:  [id number]  Height:       69.0 in Accession #:    [id number] Weight:       175.5 lb Date of Birth:  1955/05/01 BSA:          1.954 m Patient Age:    63 years   BP:           88/52 mmHg Patient Gender: M          HR:           86 bpm. Exam Location:  Inpatient Procedure: Limited Echo STAT ECHO Indications:     Impella position  History:        Patient has prior history of Echocardiogram examinations, most                 recent 08/10/2019. CHF, CAD; Risk Factors:Hypertension, Diabetes                 and Dyslipidemia. Impella 5.5 placed 08/06/19.  Sonographer:    Irving Burton Senior RDCS Referring Phys: [id number] DANIEL R BENSIMHON  Sonographer Comments: Impella rep present at bedside. IMPRESSIONS  1. Limited study to assess impella placement; akinesis of the septum and apex with likely severe LV dysfunction; no pericardial effusion; doppler not performed; impella approximately 5 cm into LV from aortic valve. FINDINGS  Additional Comments: Limited study to assess impella placement; akinesis of the septum and apex with likely severe LV dysfunction; no pericardial effusion; doppler not performed; impella approximately 5 cm into LV from aortic valve. Olga Millers MD Electronically signed by Olga Millers MD Signature Date/Time: 08/11/2019/12:48:10 PM    Final    ECHOCARDIOGRAM LIMITED  Result Date: 08/09/2019    ECHOCARDIOGRAM LIMITED REPORT   Patient Name:   RIGBY LEONHARDT Date of Exam: 08/09/2019 Medical Rec #:  [id number]  Height:       69.0 in Accession #:    [id number] Weight:       209.4 lb Date of Birth:  [birth year] BSA:          2.107 m Patient Age:    63 years   BP:           108/84 mmHg Patient Gender: M          HR:  101 bpm. Exam Location:  Inpatient Procedure: Limited Echo Indications:    Impella placement  History:        Patient has prior history of Echocardiogram examinations, most                 recent 08/07/2019. CHF, CAD; Risk Factors:Hypertension, Diabetes,                 Dyslipidemia and Current Smoker.  Sonographer:    Renella Cunas RDCS Referring Phys: [id number] DANIEL R BENSIMHON IMPRESSIONS  1. Study performed at the bedside to confirm Impella left ventricular support device placement.  2. Left ventricular ejection fraction, by estimation, is 20%. The left ventricle has severely decreased function. FINDINGS  Left  Ventricle: Left ventricular ejection fraction, by estimation, is 20%. The left ventricle has severely decreased function. Weston Brass MD Electronically signed by Weston Brass MD Signature Date/Time: 08/09/2019/5:23:44 PM    Final    ECHOCARDIOGRAM LIMITED  Result Date: 08/07/2019    ECHOCARDIOGRAM LIMITED REPORT   Patient Name:   SHALIK SANFILIPPO Date of Exam: 08/07/2019 Medical Rec #:  [id number]  Height:       69.0 in Accession #:    [id number] Weight:       188.5 lb Date of Birth:  [birth year] BSA:          2.015 m Patient Age:    63 years   BP:           84/56 mmHg Patient Gender: M          HR:           89 bpm. Exam Location:  Inpatient Procedure: Limited Echo and Limited Color Doppler STAT ECHO Indications:    impella placement check  History:        Patient has prior history of Echocardiogram examinations, most                 recent 08/06/2019.                  Limited study for left ventricular function and Impella cannula                 positioning.                  The left ventricle is normal in size. Left ventricular systolic                 function is severely depressed, with an estimated ejection                 fraction of 15-20%.                 The septum is severely dyskinetic, the apex is akinetic. Lateral                 wall contractility is relatively preserved.                  The right ventricle appears decompressed/underfilled. Right                 ventricular systolic function is probably moderately depressed.                  There is enhanced respiratory displacement of the ventricular                 septum (increased ventricular interdependence) that may  represent a degree of constrictive or tamponade physiology. A                 septal "bounce" is seen, also suggestive of constrictive                 physiology. Further hemodynamic analysis is limited on this                 study.                  There is a small to moderate effusion anterior and lateral to                  the right ventricle, probably loculated. There appears to be a                 small to moderate amount of circumferential thrombus in the                 pericardial cavity.                 An echogenic structure in the anterior pericardial fluid may be                 a drain or epicardial pacing lead.                  There is a pacemaker wire or catheter in the right heart                 chambers.                 The Impella cannula is well positioned, approximately 5 cm                 across the aortic annulus.                 No aortic insufficiency is seen.  Sonographer:    Delcie Roch Referring Phys: [id number] QASUORV Z ATKINS  Thurmon Fair MD Electronically signed by Thurmon Fair MD Signature Date/Time: 08/07/2019/8:45:55 AM    Final    [place] EKG SITE RITE  Result Date: 08/21/2019 If Site Rite image not attached, placement could not be confirmed due to current cardiac rhythm.  [place] EKG SITE RITE  Result Date: 08/12/2019 If Site Rite image not attached, placement could not be confirmed due to current cardiac rhythm.  US Abdomen Limited RUQ  Result Date: 08/16/2019 CLINICAL DATA:  Assess for cholecystitis EXAM: ULTRASOUND ABDOMEN LIMITED RIGHT UPPER QUADRANT COMPARISON:  CT abdomen pelvis 07/30/2014 FINDINGS: Gallbladder: Gallbladder the gallbladder contains biliary sludge and few echogenic, shadowing gallstones, largest measuring up to 6 mm in diameter. No pericholecystic fluid. Sonographic Eulah Pont sign is reportedly negative. Common bile duct: Diameter: 4.5 mm, nondilated Liver: Heterogeneous hepatic echogenicity within nodular surface contour. No focal liver lesions. Portal vein is patent on color Doppler imaging with normal direction of blood flow towards the liver. Other: Small volume ascites in the right upper quadrant. IMPRESSION: Heterogeneous, nodular liver most often compatible with cirrhosis. Cholelithiasis and biliary sludge. Mild wall thickening is nonspecific in the  setting of intrinsic liver disease and ascites. No other sonographic features of acute cholecystitis. However, if there is persisting clinical concern, a HIDA could be obtained. Electronically Signed   By: Kreg Shropshire M.D.   On: 08/16/2019 02:35    Labs:  CBC: Recent Labs    08/21/19 0406 08/21/19 0406 08/22/19 0349 08/23/19 0326 08/23/19 1759 08/23/19  1805  WBC 15.3*  --  17.9* 18.8* 20.5*  --   HGB 9.3*   < > 8.6* 9.3* 8.8* 9.9*  HCT 28.3*   < > 27.3* 30.6* 29.4* 29.0*  PLT 319  --  359 376 419*  --    < > = values in this interval not displayed.    COAGS: Recent Labs    08/06/19 0340 08/06/19 0340 08/07/19 0347 08/07/19 1442 08/08/19 0302 08/20/19 1202 08/21/19 0406 08/22/19 0349 08/23/19 0312 08/23/19 0447 08/24/19 0335  INR 1.2  --  1.3* 1.2  --  1.6*  --   --   --   --   --   APTT 94*   < > 44*  --    < >  --    < > 80* 157* 88* 48*   < > = values in this interval not displayed.    BMP: Recent Labs    08/22/19 1651 08/22/19 1651 08/23/19 0312 08/23/19 1537 08/23/19 1805 08/24/19 0335  NA 151*   < > 152* 150* 150* 152*  K 3.2*   < > 3.1* 4.2 3.8 2.7*  CL 96*  --  99 98  --  102  CO2 40*  --  41* 35*  --  35*  GLUCOSE 199*  --  104* 180*  --  78  BUN 75*  --  76* 82*  --  77*  CALCIUM 8.2*  --  7.9* 8.5*  --  9.1  CREATININE 1.57*  --  1.40* 1.62*  --  1.69*  GFRNONAA 46*  --  52* 44*  --  42*  GFRAA 53*  --  >60 51*  --  48*   < > = values in this interval not displayed.    LIVER FUNCTION TESTS: Recent Labs    08/21/19 0406 08/21/19 1700 08/22/19 0349 08/22/19 1400 08/22/19 1651 08/23/19 0312 08/23/19 1537 08/24/19 0335  BILITOT 16.1*  --  18.9*  --   --  14.9*  --  13.6*  AST 597*  --  554*  --   --  359*  --  239*  ALT 353*  --  317*  --   --  267*  --  197*  ALKPHOS 925*  --  860*  --   --  675*  --  554*  PROT 5.8*  --  6.0*  --   --  5.7*  --  6.3*  ALBUMIN 2.2*   < > 2.4*   < > 2.1* 1.9* 2.5* 2.9*   < > = values in this  interval not displayed.    Assessment and Plan:  64 y.o, male inpatient. History of Cardiogenic/ hemorrhagic shock s/p CABG on 6.24.21 complicated by VT arrest, ECMO (decannulated on 6.28.21) s/p impella placement on 6.28.21 removed on 7.9.21) now in MOD with hyperbilirubinuria and ongoing fevers with leukocytosis despite IV antibiotics. HIDA scan performed on 7.15.21 reads No gallbladder activity identified after 90 minutes of imaging and following IV administration of 3 mg of morphine. Patency of the cystic duct cannot be confirmed. Team is concerned for acalculous cholecystitis. Team is requesting cholecystostomy tube placement.  Patient is now trached placed on 7.12.21 with a left sided and right sided chest tube.  Blood culture from 7.12.21 resulted as candida albicans on 7.15.21, WBC is 20.5, Total bilirubin is 13.6, AST 239 ALT 197, Cr 1.689, BUN 77  Patient is on Angiomax.   IR consulted for possible cholecystomy tube placement. Case has  been reviewed and procedure approved by Dr. Loreta Ave.  Patient tentatively scheduled for 7.16.21.  Team instructed to: Keep Patient to be NPO after midnight Hold Angiomax 2-3 hours prior to procedure   IR will call patient when ready.   Risks and benefits discussed with the patient's wife including bleeding, infection, damage to adjacent structures, bowel perforation/fistula connection, and sepsis.  All of the patient's wife questions were answered, patient is agreeable to proceed. Consent signed and in IR control room.    Thank you for this interesting consult.  I greatly enjoyed meeting Ernestine Langworthy and look forward to participating in their care.  A copy of this report was sent to the requesting provider on this date.  Electronically Signed: Alene Mires, NP 08/24/2019, 9:22 AM   I spent a total of 40 Minutes    in face to face in clinical consultation, greater than 50% of which was counseling/coordinating care for cholecystomy tube  placement.

## 2019-08-24 NOTE — Progress Notes (Signed)
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+  Labs: Recent Labs    08/23/19 0447 08/23/19 1759 08/23/19 1759 08/23/19 1805 08/23/19 1805 08/24/19 0335 08/24/19 0335 08/24/19 0929 08/24/19 1459 08/24/19 1501 08/24/19 1833 08/24/19 2043 08/24/19 2053  HGB  --  8.8*   < > 9.9*   < >  --   --  8.2*  --   --   --  8.6*  --   HCT  --  29.4*   < > 29.0*  --   --   --  27.9*  --   --   --  29.8*  --   PLT  --  419*  --   --   --   --   --  370  --   --   --  429*  --   APTT   < >  --   --   --   --  48*  --   --   --   --  80*  --  78*  CREATININE   < >  --   --   --   --  1.69*   < >  --  1.87* 1.87*  --   --  1.72*   < > = values in this interval not displayed.    Assessment: 64 yo male s/p ECMO and Impella. Chest closed 7/2. CT drainage decreased.    HIT ab sent a few days ago and returned as 2.6 - positive, switched from heparin to bivalirudin. SRA positive, confirmed for HIT.  APTT turned off since 7/15 for biliary drain placement in IR 7/16. Low aPTT from this morning due to heparin being held. Pt now s/p drain placement. After discussion with Dr. Grace Isaac, will restart bivalirudin at prior 0.055 mg/kg/hr dose immediately post-procedure. Hgb down to 8.2, plt stable 370. LDH down to 284.  4-hr APTT therapeutic at 80. No overt bleeding or infusion issues.  7/16 PM update:  Confirmatory aPTT therapeutic   Goals of Therapy: APTT 50-85 secs  Plan: Continue bivalirudin at 0.055 mg/kg/hr Monitor daily aPTT, CBC, and for s/sx of bleeding  Abran Duke, PharmD, BCPS Clinical Pharmacist Phone: [phone]

## 2019-08-24 NOTE — Progress Notes (Addendum)
NAME:  Todd [name], MRN:  [id number], DOB:  1955/08/29, LOS: 3 ADMISSION DATE:  07/26/2019, CONSULTATION DATE:  08/03/19 REFERRING MD:  Roxan Hockey, CHIEF COMPLAINT:  ECMO   Brief History   VA ECMO for post CABG Vtach arrest  History of present illness   Presented with worsening dyspnea 6/17 c/w CHF exacerbation Cath with severe triple vessel disease Underwent CABG 6/24 Vtach arrest 6/25 Chest opened bedside and cardiac massage initiated as well as multiple cardioversions, amiodarone, bicarb etc Brought to OR and cannulated for VA ECMO PCCM consulted to assist with management Comorbidities include DM, heavy smoking, COPD  Past Medical History  Depression Ischemic cardiomyopathy HTN [facility] Events   6/24 CABG 6/25 VA cannulation 6/28 decannulated and impella placed 6/29 bedside re-exploration; s/p decannulation. Placement of impella device originally at p8 but decreased to p4 2/2 suction events overnight up to p6. Echo completed and repositioned device. Remained on considerable support with 8 epi and 46 norepi vaso 0.05. continued chest tube output with large clots noted. Heparin thru device. Replacement products ongoing. 60% 8 peep 6/30: bedside mediastinal re-exploration and clean out of hematoma with tamponade and worsening hemodynamics. Pt had large volume transfusion. rebolused with amio 2/2 nsvt episode.  Weight up 48 pounds.  Started diuresis, Lasix drip started. 7/1 iatrogenic respiratory alkalosis, vent rate decreased. 7/2: No significant issues overnight remains on pressors and Impella not tolerating tube feeds with high gastric output awaiting core track placement 7/3: started trickle TF  7/5: Swab removed, CVL and PICC placed. 7/13 Trach  Consults:  Advanced heart failure, PCCM, TCTS  Procedures:  See below  Significant Diagnostic Tests:  6/22 spirometry with restrictive physiology, preserved FEV1/FVC CXR today with bilateral airspace disease,  cannulas in good position, deep left sulcus that was present yesterday, no clear PTX 6/18 echo: LVEF 27-03%, grade I diastolic dysfunction 7/7: US Abdomen cholelithiasis without cholecystitis. Mild wall thinking in setting of liver disease and ascites.   Micro Data:  COVID and HIV Neg 7/3 respiratory-few Candida tropicalis 7/5 blood>> 7/5 respiratory-few Candida tropicalis 7/7 urine-NG 7/7 blood>>NG 7/9 wound>> NG 7/12 BAL>>NG  Antimicrobials:  Vanc6/24>>7/13 Cefuroxime 6/24>> 6/25 merrem 6/26->7/14 Anidulafungin 7/8->7/14 Zosyn 7/14>> Interim history/subjective:  No events. Awake this AM and tracking. Still won't follow commands for me. Still having fevers.  Objective   Blood pressure (!) 124/45, pulse 83, temperature (!) 100.8 F (38.2 C), resp. rate (!) 25, height _0  (1.753 m), weight 68.2 kg, SpO2 99 %. CVP:  [7 mmHg] 7 mmHg  Vent Mode: PRVC FiO2 (%):  [40 %] 40 % Set Rate:  [20 bmp] 20 bmp Vt Set:  [420 mL] 420 mL PEEP:  [8 cmH20] 8 cmH20 Pressure Support:  [13 cmH20] 13 cmH20 Plateau Pressure:  [19 cmH20-24 cmH20] 19 cmH20   Intake/Output Summary (Last 24 hours) at 08/24/2019 0713 Last data filed at 08/24/2019 5009 Gross per 24 hour  Intake 2277.1 ml  Output 3565 ml  Net -1287.9 ml   Filed Weights   08/22/19 0500 08/23/19 0330 08/24/19 0500  Weight: 72 kg 71.7 kg 68.2 kg    Examination: GEN: ill appearing man in NAD HEENT: ETT in place, minimal secretions CV: RRR, ext warm PULM: scattered rhonci, triggering vent GI: Soft, hypoactive BS, rectal tube in place EXT: Anasarca, mild, muscle wasting NEURO: tracks today, not following commands,  PSYCH: cannot assess SKIN: Jaundiced  SvO2 64 Bili improving K low, being repleted Renal function stable AST/ALT improving LDH improving No CBC today  Resolved Hospital Problem list   N/A  Assessment & Plan:   Cardiogenic/hemorrhagic shock status post CABG complicated by VT arrest, EF 20 to 25% with  severe LV dysfunction and moderate RV dysfunction.  S/p VA ECMO (decanulated 6/28). 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 LE duplex neb. - Switched to zosyn 7/14 by ID - Trend  Acute blood loss anemia-stable Thrombocytopenia--platelets improving since discontinuation of heparin.  -Continue bivalirudin -Continue to monitor CBCs  High risk malnutrition  -Continue Tube Feeds  Acute encephalopathy due to sedation- sedation off, EEG/CT head/ammonia do not explain - Overall seems to be improving, can consider MRI brain at some point when pacer wires out  Best practice:  Diet: Tube feeds on hold for perc drain Pain/Anxiety/Delirium protocol (if indicated): PRN only VAP protocol (if indicated): in place DVT prophylaxis: bival GI prophylaxis: PPI Glucose control: see above Mobility: BR Code Status: Full Family Communication: per primary Disposition:  ICU   The patient is critically ill with multiple organ systems failure and requires high complexity decision making for assessment and support, frequent evaluation and titration of therapies, application of advanced monitoring technologies and extensive interpretation of multiple databases. Critical Care Time devoted to patient care  services described in this note independent of APP/resident time (if applicable)  is 36 minutes.   Erskine Emery MD [facility] 08/24/2019 7:13 AM Personal pager: [phone] If unanswered, please page CCM On-call: [phone]

## 2019-08-24 NOTE — Progress Notes (Signed)
PT transported to CT to do a procedure and then back to 2H02 without any complications.

## 2019-08-24 NOTE — Progress Notes (Signed)
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+  Labs: Recent Labs    08/23/19 0312 08/23/19 0326 08/23/19 0447 08/23/19 1537 08/23/19 1759 08/23/19 1759 08/23/19 1805 08/24/19 0335 08/24/19 0929 08/24/19 1459 08/24/19 1501 08/24/19 1833  HGB   < > 9.3*  --   --  8.8*   < > 9.9*  --  8.2*  --   --   --   HCT   < > 30.6*  --   --  29.4*  --  29.0*  --  27.9*  --   --   --   PLT  --  376  --   --  419*  --   --   --  370  --   --   --   APTT   < >  --  88*  --   --   --   --  48*  --   --   --  80*  CREATININE  --   --   --    < >  --   --   --  1.69*  --  1.87* 1.87*  --    < > = values in this interval not displayed.    Assessment: 64 yo male s/p ECMO and Impella. Chest closed 7/2. CT drainage decreased.    HIT ab sent a few days ago and returned as 2.6 - positive, switched from heparin to bivalirudin. SRA positive, confirmed for HIT.  APTT turned off since 7/15 for biliary drain placement in IR 7/16. Low aPTT from this morning due to heparin being held. Pt now s/p drain placement. After discussion with Dr. Grace Isaac, will restart bivalirudin at prior 0.055 mg/kg/hr dose immediately post-procedure. Hgb down to 8.2, plt stable 370. LDH down to 284.  4-hr APTT therapeutic at 80. No overt bleeding or infusion issues.  Goals of Therapy: APTT 50-85 s  Plan: Continue bivalirudin at 0.055 mg/kg/hr Check 4-hr aPTT to confirm Monitor daily aPTT, CBC, and for s/sx of bleeding   Todd [name], PharmD, BCPS Please check AMION for all [facility] contact numbers Clinical Pharmacist 08/24/2019 7:09 PM

## 2019-08-24 NOTE — Progress Notes (Signed)
Patient ID: Todd [name], male   DOB: 05-Nov-1955, 64 y.o.   MRN: [id number]     Advanced Heart Failure Rounding Note  PCP-Cardiologist: No primary care provider on file.   Subjective:    08/02/19 CABG 08/03/19 VF arrest. 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  MAP stable.  Co-ox 64%.   In NSR on amiodarone at 30, no further NSVT.  He had a run of atrial fibrillation with RVR last night.   He is now off Lasix gtt.  I/Os net negative with bolus Lasix, CVP 11 today.  K low at 2.7, Na up to 152.   Tm 100.8 with WBCs 20.5.  Meropenem and [name] stopped 7/14, Zosyn started.  Now with Candida from BAL.  Venous dopplers negative for DVT.   Ileus, improving.  Now getting tube feeds via post-pyloric tube.  Off TPN.   Creatinine 1.46 => 1.53 => 1.35 => 1.57 => 1.71 => 1.4 => 1.69.  Na 152 this morning, getting free water boluses.     Patient now following commands for nurse. CT head negative 7/11. EEG with evidence for diffuse encephalopathy. NH3 38.   Jaundiced with consistently elevated bilirubin (direct), tbili decreasing at 13.6 today.  Abdominal US with gallbladder sludge, no definite cholecystitis.  HIDA scan with patent CBD but no gallbladder activity. Abdominal CT with gallbladder sludge.   Objective:   Weight Range: 68.2 kg Body mass index is 22.2 kg/m.   Vital Signs:   Temp:  [98.2 F (36.8 C)-102.2 F (39 C)] 100.8 F (38.2 C) (07/16 0615) Pulse Rate:  [63-117] 84 (07/16 0716) Resp:  [17-30] 24 (07/16 0716) BP: (107-133)/(45-64) 113/47 (07/16 0716) SpO2:  [97 %-100 %] 100 % (07/16 0725) Arterial Line  BP: (87-142)/(25-61) 113/45 (07/16 0630) FiO2 (%):  [40 %] 40 % (07/16 0725) Weight:  [68.2 kg] 68.2 kg (07/16 0500) Last BM Date: 08/23/19  Weight change: Filed Weights   08/22/19 0500 08/23/19 0330 08/24/19 0500  Weight: 72 kg 71.7 kg 68.2 kg    Intake/Output:   Intake/Output Summary (Last 24 hours) at 08/24/2019 0802 Last data filed at 08/24/2019 8546 Gross per 24 hour  Intake 2277.1 ml  Output 3565 ml  Net -1287.9 ml      Physical Exam    General: Awake, NAD Neck: Trach, JVP 10 cm, no thyromegaly or thyroid nodule.  Lungs: Decreased at bases.  CV: Nondisplaced PMI.  Heart regular S1/S2, no S3/S4, no murmur.  1+ ankle edema.  Abdomen: Soft, nontender, no hepatosplenomegaly, no distention.  Skin: Jaundice  Neurologic: Following commands per nursing. Extremities: No clubbing or cyanosis.  HEENT: Normal.    Telemetry   NSR 80s, run of atrial fibrillation yesterday.  Personally reviewed   Labs    CBC Recent Labs    08/23/19 0326 08/23/19 0326 08/23/19 1759 08/23/19 1805  WBC 18.8*  --  20.5*  --   HGB 9.3*   < > 8.8* 9.9*  HCT 30.6*   < > 29.4* 29.0*  MCV 93.0  --  94.5  --   PLT 376  --  419*  --    < > = values in this interval not displayed.  Basic Metabolic Panel Recent Labs    08/23/19 0312 08/23/19 0312 08/23/19 1537 08/23/19 1537 08/23/19 1805 08/24/19 0335  NA 152*   < > 150*   < > 150* 152*  K 3.1*   < > 4.2   < > 3.8 2.7*  CL 99   < > 98  --   --  102  CO2 41*   < > 35*  --   --  35*  GLUCOSE 104*   < > 180*  --   --  78  BUN 76*   < > 82*  --   --  77*  CREATININE 1.40*   < > 1.62*  --   --  1.69*  CALCIUM 7.9*   < > 8.5*  --   --  9.1  MG 2.5*  --   --   --   --  2.7*  PHOS 5.1*  --  6.9*  --   --   --    < > = values in this interval not displayed.   Liver Function Tests Recent Labs    08/23/19 0312 08/23/19 0312 08/23/19 1537 08/24/19 0335  AST 359*  --   --  239*  ALT 267*  --   --  197*  ALKPHOS 675*  --   --  554*    BILITOT 14.9*  --   --  13.6*  PROT 5.7*  --   --  6.3*  ALBUMIN 1.9*   < > 2.5* 2.9*   < > = values in this interval not displayed.   No results for input(s): LIPASE, AMYLASE in the last 72 hours. Cardiac Enzymes No results for input(s): CKTOTAL, CKMB, CKMBINDEX, TROPONINI in the last 72 hours.  BNP: BNP (last 3 results) Recent Labs    07/26/19 1236  BNP 2,568.2*    ProBNP (last 3 results) No results for input(s): PROBNP in the last 8760 hours.   D-Dimer No results for input(s): DDIMER in the last 72 hours. Hemoglobin A1C No results for input(s): HGBA1C in the last 72 hours. Fasting Lipid Panel Recent Labs    08/24/19 0335  TRIG 252*   Thyroid Function Tests No results for input(s): TSH, T4TOTAL, T3FREE, THYROIDAB in the last 72 hours.  Invalid input(s): FREET3  Other results:   Imaging    CT ABDOMEN W CONTRAST  Result Date: 08/23/2019 CLINICAL DATA:  Cholecystitis. Fever and leukocytosis. Elevated bilirubin. EXAM: CT ABDOMEN WITH CONTRAST TECHNIQUE: Multidetector CT imaging of the abdomen was performed using the standard protocol following bolus administration of intravenous contrast. CONTRAST:  54m OMNIPAQUE IOHEXOL 300 MG/ML  SOLN COMPARISON:  07[place] chest: Small bilateral pleural effusions and dependent bibasilar atelectasis. Hepatobiliary: No hepatic masses identified. High attenuation sludge is seen in the gallbladder, however there are no findings of acute cholecystitis or biliary ductal dilatation. Pancreas:  No mass or inflammatory changes. Spleen:  Within normal limits in size and appearance. Adrenals/Urinary Tract: 1.4 cm left adrenal mass is seen which is nonspecific, but most likely represents a benign adenoma in patient without history of cancer. Moderate left renal atrophy is seen. No evidence of renal masses or hydronephrosis. Stomach/Bowel: A feeding tube is seen with tip in the proximal jejunum. No evidence  of dilated bowel loops. Mild diffuse mesenteric edema and ascites is seen, but there is no evidence of focal inflammatory process or abscess. Vascular/Lymphatic: No pathologically enlarged lymph nodes identified. No abdominal aortic aneurysm. Aortic atherosclerosis noted. Other:  None. Musculoskeletal:  No suspicious bone lesions identified. IMPRESSION: Gallbladder sludge. No radiographic evidence of acute cholecystitis or biliary ductal dilatation. Mild ascites and diffuse mesenteric edema. No evidence of focal inflammatory process or abscess. Small bilateral pleural effusions and dependent bibasilar atelectasis. Small indeterminate left adrenal mass. If patient has no history of malignancy, this is probably a benign adenoma and follow-up by CT is recommended in 12 months. If patient does have cancer history, adrenal protocol abdomen CT without and with contrast is recommended now for further characterization. This recommendation follows ACR consensus guidelines: Management of Incidental Adrenal Masses: A White Paper of the ACR Incidental Findings Committee. J Am Coll Radiol 2017;14:1038-1044. Aortic Atherosclerosis (ICD10-I70.0). Electronically Signed   By: Marlaine Hind M.D.   On: 08/23/2019 17:54   NM Hepatobiliary Liver Func  Result Date: 08/23/2019 CLINICAL DATA:  Cholecystitis scratch set evaluate cholecystitis. Elevated bilirubin. EXAM: NUCLEAR MEDICINE HEPATOBILIARY IMAGING TECHNIQUE: Sequential images of the abdomen were obtained out to 60 minutes following intravenous administration of radiopharmaceutical. RADIOPHARMACEUTICALS:  4.9 mCi Tc-53m Choletec IV COMPARISON:  Abdominal sonogram from 08/16/2019 FINDINGS: Prompt uptake of activity by the liver is seen. Delayed biliary activity is noted beginning around 25 minutes post injection. Biliary activity passes into small bowel, consistent with patent common bile duct. After 1 hour no gallbladder activity is identified and there is persistent diffuse  radiotracer activity throughout the liver. The patient subsequently received 3 mg of morphine, IV and continuous imaging was performed for an additional 30 minutes. No gallbladder activity identified following morphine administration. IMPRESSION: 1. No gallbladder activity identified after 90 minutes of imaging and following IV administration of 3 mg of morphine. Patency of the cystic duct cannot be confirmed. 2. There is delayed biliary activity occurring approximately 25 minutes post injection of the radiopharmaceutical. In addition, there is persistence of radiotracer activity throughout the liver following 60 minutes and 90 minutes of imaging which suggest some degree of hepatic dysfunction. 3. Biliary to bowel activity is identified confirming patency of the common bile duct. Electronically Signed   By: TKerby MoorsM.D.   On: 08/23/2019 11:23     Medications:     Scheduled Medications: . artificial tears   Both Eyes Q8H  . aspirin  81 mg Per Tube Daily  . chlorhexidine gluconate (MEDLINE KIT)  15 mL Mouth Rinse BID  . Chlorhexidine Gluconate Cloth  6 each Topical Daily  . collagenase   Topical Daily  . docusate  200 mg Per Tube Daily  . feeding supplement (PROSource TF)  45 mL Per Tube BID  . fentaNYL (SUBLIMAZE) injection  50 mcg Intravenous Once  . free water  300 mL Per Tube Q4H  . insulin aspart  0-24 Units Subcutaneous Q4H  . insulin aspart  5 Units Subcutaneous Q4H  . ipratropium  0.5 mg Nebulization Q6H  . levalbuterol  1.25 mg Nebulization Q6H  . mouth rinse  15 mL Mouth Rinse 10 times per day  . pantoprazole (PROTONIX) IV  40 mg Intravenous QHS  . potassium chloride  40 mEq Per Tube BID  . sodium chloride flush  10-40 mL Intracatheter Q12H  . sodium chloride flush  10-40 mL Intracatheter Q12H  . sodium chloride flush  3 mL Intravenous Q12H    Infusions: . sodium chloride 250 mL (08/13/19 1412)  . sodium chloride Stopped (08/13/19 1631)  . amiodarone 30 mg/hr (08/24/19  04166  . bivalirudin (ANGIOMAX) infusion 0.5 mg/mL (Non-ACS indications) Stopped (08/23/19 1900)  . epinephrine Stopped (  08/19/19 1251)  . feeding supplement (PIVOT 1.5 CAL) 1,000 mL (08/22/19 1600)  . fentaNYL infusion INTRAVENOUS Stopped (08/21/19 0610)  . lactated ringers 20 mL/hr at 08/10/19 1705  . lactated ringers 10 mL/hr at 08/18/19 0054  . milrinone 0.125 mcg/kg/min (08/24/19 8938)  . norepinephrine (LEVOPHED) Adult infusion 11 mcg/min (08/24/19 1017)  . piperacillin-tazobactam (ZOSYN)  IV 3.375 g (08/24/19 5102)  . potassium chloride    . vasopressin 0.03 Units/min (08/24/19 0633)    PRN Medications: sodium chloride, sodium chloride, dextrose, fentaNYL, metoprolol tartrate, midazolam, morphine injection, ondansetron (ZOFRAN) IV, sodium chloride flush, sodium chloride flush, sodium chloride flush   Assessment/Plan   1. Shock: At this point, think mixed cardiogenic/septic.  Echo with EF 20-25%, moderate RV dysfunction.  VA ECMO post-VF arrest, cannulated 6/25.  Stable s/p return to OR for mediastinal re-exploration due to bleeding on 6/25.  Decannulated from [place] ECMO on 08/06/19. Impella 5.5 placed. Underwent emergent chest re-exploration on 6/29 for tamponade and clot removal. Chest closed 7/2.  Impella 5.5 removed 7/9.  This morning, he is on NE 11, milrinone 0.125, vasopressin 0.03.  Suspect component of septic/vasodilatory shock with prior fever, elevated WBCs.  Co-ox 64%, CVP 11.  Creatinine 1.69 today.  - Continue pressor wean, will cut back on vasopressin.  - Lasix 40 mg IV bid again today to aim to keep gently negative.  Aggressive K repletion, check BMET in pm.  2. 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  Prolonged VF arrest on 6/25. Chest closed 7/2.  - ASA  - Hold Crestor with elevated LFTs.  3. Cardiac arrest: VF arrest with prolonged code. Still with episodes of VT/NSVT in setting of low K (difficult to effectively replace).  -  Continue aggressive K repletion.   - Continue amiodarone 30 mg/hr.   - Eventual evaluation for ICD or lifevest at d/c - Keep K > 4.0 Mg > 2.0 4. Acute hypoxemic respiratory failure: In setting of cardiac arrest.  H/o COPD.  Bibasilar infiltrates, possible HCAP.  Now with tracheostomy.  - Abx changed to Zosyn, afebrile.  5. Acute blood loss anemia: Now s/p mediastinal re-exploration x 3. Hgb stable today.  CT drainage has slowed.   - Now on bivalirudin gtt. 6. 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  Platelets have trended back up.  7. ID: Suspect HCAP, on meropenem/anidulafungin/vanco initially with ongoing fevers and leukocytosis. Venous dopplers negative for DVT. Possible drug fever from meropenem, he was switched to Zosyn on 7/14 and is now afebrile.  He had abdominal US with gallbladder sludge, no definite cholecystitis.  HIDA scan done, GB does not opacify => ?acalculous cholecystitis. Still with low grade fevers to 100.8 max overnight.  Also noted to have Candida in BAL from 7/12.  - Think IR for percutaneous GB drain is reasonable => ordered today.  - ID following.  - Continue Zosyn.  - He had a course of anidulafungin recently, review further antifungal coverage based on BAL with ID and CCM.   8. Elevated bilirubin: Mostly direct = likely cholestatic/shock liver/RV failure predominantly.   Abdominal US with evidence for cirrhosis, sludge in GB but no definite acute cholecystitis. NH3 was not significantly elevated. Tbili 13.9 today, lower.  HIDA scan showed patent common bile duct, so not obstructive stone causing elevated bilirubin.  9. Ileus: Improved.  Now getting tube feeds and off TPN. 10. Atrial fibrillation/flutter: Paroxysmal. Back in NSR today.     - Continue amiodarone gtt.  -  bivalirudin gtt.  11. Neuro: Now following commands per nursing. CT head negative on 7/11. EEG with e/o diffuse encephalopathy.  NH3 38.  - Head MRI when pacing wires out.  12. Hypernatremia:  Increase free water boluses to 300 cc q4 hrs.   CRITICAL CARE Performed by: Loralie Champagne  Total critical care time: 40 minutes at bedside  Critical care time was exclusive of separately billable procedures and treating other patients.  Critical care was necessary to treat or prevent imminent or life-threatening deterioration.  Critical care was time spent personally by me on the following activities: development of treatment plan with patient and/or surrogate as well as nursing, discussions with consultants, evaluation of patient's response to treatment, examination of patient, obtaining history from patient or surrogate, ordering and performing treatments and interventions, ordering and review of laboratory studies, ordering and review of radiographic studies, pulse oximetry and re-evaluation of patient's condition.   Length of Stay: Amasa, MD  08/24/2019, 8:02 AM  Advanced Heart Failure Team Pager [phone] (M-F; 7a - 4p)  Please contact [facility] for night-coverage after hours (4p -7a ) and weekends on [email or web address]

## 2019-08-24 NOTE — Procedures (Signed)
Pre procedural Dx: Cholecysitis Post procedural Dx: Same  Technically successful [place] and CT guided placed of a 10 Fr cholecystomy tube.  A representative aspirated sample was capped and sent to the laboratory for analysis.    EBL: Trace Complications: None immediate  Katherina Right, MD Pager #: [phone]

## 2019-08-24 NOTE — Progress Notes (Signed)
7 Days Post-Op Procedure(s) (LRB): REMOVAL OF IMPELLA LEFT VENTRICULAR ASSIST DEVICE (N/A) TRANSESOPHAGEAL ECHOCARDIOGRAM (TEE) (N/A) Subjective: Alert, tracking, has been following some commands  Objective: Vital signs in last 24 hours: Temp:  [98.2 F (36.8 C)-102.2 F (39 C)] 100.8 F (38.2 C) (07/16 0615) Pulse Rate:  [63-117] 84 (07/16 0716) Cardiac Rhythm: Normal sinus rhythm (07/16 0400) Resp:  [17-30] 24 (07/16 0716) BP: (107-133)/(45-64) 113/47 (07/16 0716) SpO2:  [97 %-100 %] 100 % (07/16 0725) Arterial Line BP: (87-142)/(25-61) 113/45 (07/16 0630) FiO2 (%):  [40 %] 40 % (07/16 0725) Weight:  [68.2 kg] 68.2 kg (07/16 0500)  Hemodynamic parameters for last 24 hours: CVP:  [7 mmHg] 7 mmHg  Intake/Output from previous day: 07/15 0701 - 07/16 0700 In: 2277.1 [I.V.:1336.4; NG/GT:20; IV Piggyback:920.7] Out: 3565 [Urine:3385; Chest Tube:180] Intake/Output this shift: No intake/output data recorded.  General appearance: alert and no distress Neurologic: see subjective Heart: regular rate and rhythm Lungs: clear to auscultation bilaterally Abdomen: normal findings: soft, non-tender Wound: clean and intact  Lab Results: Recent Labs    08/23/19 0326 08/23/19 0326 08/23/19 1759 08/23/19 1805  WBC 18.8*  --  20.5*  --   HGB 9.3*   < > 8.8* 9.9*  HCT 30.6*   < > 29.4* 29.0*  PLT 376  --  419*  --    < > = values in this interval not displayed.   BMET:  Recent Labs    08/23/19 1537 08/23/19 1537 08/23/19 1805 08/24/19 0335  NA 150*   < > 150* 152*  K 4.2   < > 3.8 2.7*  CL 98  --   --  102  CO2 35*  --   --  35*  GLUCOSE 180*  --   --  78  BUN 82*  --   --  77*  CREATININE 1.62*  --   --  1.69*  CALCIUM 8.5*  --   --  9.1   < > = values in this interval not displayed.    PT/INR: No results for input(s): LABPROT, INR in the last 72 hours. ABG    Component Value Date/Time   PHART 7.519 (H) 08/23/2019 1805   HCO3 41.8 (H) 08/23/2019 1805   TCO2 43  (H) 08/23/2019 1805   ACIDBASEDEF 1.0 08/08/2019 1115   O2SAT 63.8 08/24/2019 0335   CBG (last 3)  Recent Labs    08/23/19 1920 08/23/19 2344 08/24/19 0334  GLUCAP 207* 140* 104*    Assessment/Plan: S/P Procedure(s) (LRB): REMOVAL OF IMPELLA LEFT VENTRICULAR ASSIST DEVICE (N/A) TRANSESOPHAGEAL ECHOCARDIOGRAM (TEE) (N/A) remains critically ill  CV- a fib overnight, in SR this AM. On amidarone  Milrinone, norepi, back up to 0.03 on vasopressin  Co-ox= 64  I removed pacing wires this AM without resistance RESP_ VDRf, trach trials when CCM thinks he is ready RENAL- creatinine up slightly  Hypernatremia and hypokalemia persist- correct  Hold Lasix until K better ENDO- CBG variable, continue SSI Nutrition- resume Tf after perc chole HEME- bivalrudin on hold pending perc chole ID- still spiking temps, for percutaneous cholecystostomy today  LFTs trending better   LOS: 29 days    [name] 08/24/2019

## 2019-08-24 NOTE — Progress Notes (Signed)
[facility] BRIEF SICU PROGRESS NOTE  7 Days Post-Op  S/P Procedure(s) (LRB): REMOVAL OF IMPELLA LEFT VENTRICULAR ASSIST DEVICE (N/A) TRANSESOPHAGEAL ECHOCARDIOGRAM (TEE) (N/A)   S/P percutaneous cholecystostomy tube earlier today NSR w/ stable BP O2 sats 100% currently on TC trial UOP adequate  Plan: Continue current plan  Purcell Nails, MD 08/24/2019 5:02 PM

## 2019-08-24 NOTE — Progress Notes (Signed)
[facility]   Reason for visit: Follow up on fever  Interval History: fever yesterday to 101.7, WBC stable at 18.4.  Has decreasing pressor support needs.  IR guided GB drain planned for today for possible acalculous cholecystitis.  No rash or diarrhea.  Day 3 piperacillin/tazobactam Day 23 total antibiotics  Physical Exam: Constitutional:  Vitals:   08/24/19 0915 08/24/19 0930  BP:    Pulse: 82 85  Resp: (!) 23 20  Temp:    SpO2: 100% 100%   patient appears in NAD Eyes: icteric; eyes open HENT: + trach Respiratory: Normal respiratory effort; CTA B Cardiovascular: tachy RR, hyperdynamic Neuro: seems to follow some commands/answers appropriately with head nod  Review of Systems: Unable to be assessed due to patient factors  Lab Results  Component Value Date   WBC 18.4 (H) 08/24/2019   HGB 8.2 (L) 08/24/2019   HCT 27.9 (L) 08/24/2019   MCV 97.9 08/24/2019   PLT 370 08/24/2019    Lab Results  Component Value Date   CREATININE 1.69 (H) 08/24/2019   BUN 77 (H) 08/24/2019   NA 152 (H) 08/24/2019   K 2.7 (LL) 08/24/2019   CL 102 08/24/2019   CO2 35 (H) 08/24/2019    Lab Results  Component Value Date   ALT 197 (H) 08/24/2019   AST 239 (H) 08/24/2019   ALKPHOS 554 (H) 08/24/2019     Microbiology: Recent Results (from the past 240 hour(s))  Culture, Urine     Status: None   Collection Time: 08/15/19  6:13 PM   Specimen: Urine, Catheterized  Result Value Ref Range Status   Specimen Description URINE, CATHETERIZED  Final   Special Requests NONE  Final   Culture   Final    NO GROWTH Performed at [facility], [address] [place], [place] [address]    Report Status 08/17/2019 FINAL  Final  Culture, blood (routine x 2)     Status: None   Collection Time: 08/15/19  7:53 PM   Specimen: BLOOD RIGHT HAND  Result Value Ref Range Status   Specimen Description BLOOD RIGHT HAND  Final   Special Requests   Final    BOTTLES DRAWN  AEROBIC AND ANAEROBIC Blood Culture adequate volume   Culture   Final    NO GROWTH 5 DAYS Performed at [facility], [place] [address] [place], [place] [address]    Report Status 08/20/2019 FINAL  Final  Culture, blood (routine x 2)     Status: None   Collection Time: 08/15/19  7:53 PM   Specimen: BLOOD RIGHT HAND  Result Value Ref Range Status   Specimen Description BLOOD RIGHT HAND  Final   Special Requests   Final    BOTTLES DRAWN AEROBIC AND ANAEROBIC Blood Culture adequate volume   Culture   Final    NO GROWTH 5 DAYS Performed at [facility], [place] [address], [place], [place] [address]    Report Status 08/20/2019 FINAL  Final  Aerobic/Anaerobic Culture (surgical/deep wound)     Status: None   Collection Time: 08/17/19  2:32 PM   Specimen: Wound  Result Value Ref Range Status   Specimen Description WOUND  Final   Special Requests NONE  Final   Gram Stain NO WBC SEEN NO ORGANISMS SEEN   Final   Culture   Final    No growth aerobically or anaerobically. Performed at [facility], [place]  [address] [place], [place] [address]    Report Status 08/22/2019 FINAL  Final  Culture, fungus without smear     Status: Abnormal (Preliminary result)   Collection Time: 08/20/19  2:00 PM   Specimen: Bronchoalveolar Lavage; Other  Result Value Ref Range Status   Specimen Description BRONCHIAL ALVEOLAR LAVAGE  Final   Special Requests   Final    Normal Performed at [facility], [place] [address], [place], [place] [address]    Culture CANDIDA ALBICANS (A)  Final   Report Status PENDING  Incomplete  Culture, bal-quantitative     Status: Abnormal (Preliminary result)   Collection Time: 08/20/19  2:00 PM   Specimen: Bronchoalveolar Lavage; Respiratory  Result Value Ref Range Status   Specimen Description BRONCHIAL ALVEOLAR LAVAGE  Final   Special Requests NONE  Final   Gram Stain   Final    FEW WBC PRESENT, PREDOMINANTLY PMN NO ORGANISMS SEEN    Culture (A)  Final      6,000 COLONIES/mL YEAST CULTURE REINCUBATED FOR BETTER GROWTH Performed at [facility], [address], [place], [place] [address]    Report Status PENDING  Incomplete  Body fluid culture     Status: None (Preliminary result)   Collection Time: 08/22/19  9:14 AM   Specimen: Pleura; Body Fluid  Result Value Ref Range Status   Specimen Description PLEURAL  Final   Special Requests NONE  Final   Gram Stain NO WBC SEEN NO ORGANISMS SEEN   Final   Culture   Final    NO GROWTH < 24 HOURS Performed at [facility], [address] [place], [place] [address]    Report Status PENDING  Incomplete  Culture, blood (routine x 2)     Status: None (Preliminary result)   Collection Time: 08/22/19 12:58 PM   Specimen: BLOOD RIGHT HAND  Result Value Ref Range Status   Specimen Description BLOOD RIGHT HAND  Final   Special Requests   Final    BOTTLES DRAWN AEROBIC ONLY Blood Culture adequate volume   Culture   Final    NO GROWTH < 24 HOURS Performed at [facility], [place] [address] [place], [place] [address]    Report Status PENDING  Incomplete  Culture, blood (routine x 2)     Status: None (Preliminary result)   Collection Time: 08/22/19 12:58 PM   Specimen: BLOOD RIGHT HAND  Result Value Ref Range Status   Specimen Description BLOOD RIGHT HAND  Final   Special Requests   Final    BOTTLES DRAWN AEROBIC ONLY Blood Culture adequate volume   Culture   Final    NO GROWTH < 24 HOURS Performed at [facility], [place] [address] [place], [place] [address]    Report Status PENDING  Incomplete    Impression/Plan:  1. Fever - he did have a fever spike yesterday but overall appears a bit improved with improving LFTs, more alert, stable WBC count.  Blood cultures remain ngtd.  He did have a small amount of Candida grow in the BAL which is not significant, I do not consider c/w infection.  No indication for antifungal treatment.   Continue with piperacillin/tazobactam.     2.  Liver dysfunction - some improvement again today and in the T bili and drain placement today.   4.  Shock - felt to be mixed cardiogenic and septic.  Seems to be improving some with less pressor support.  Will  continue antibiotics as above.

## 2019-08-25 ENCOUNTER — Inpatient Hospital Stay (HOSPITAL_COMMUNITY): Payer: Medicare Other

## 2019-08-25 DIAGNOSIS — Z951 Presence of aortocoronary bypass graft: Secondary | ICD-10-CM

## 2019-08-25 DIAGNOSIS — B377 Candidal sepsis: Secondary | ICD-10-CM | POA: Diagnosis not present

## 2019-08-25 LAB — CULTURE, BAL-QUANTITATIVE W GRAM STAIN: Culture: 4000 — AB

## 2019-08-25 LAB — BASIC METABOLIC PANEL
Anion gap: 12 (ref 5–15)
Anion gap: 12 (ref 5–15)
BUN: 77 mg/dL — ABNORMAL HIGH (ref 8–23)
BUN: 76 mg/dL — ABNORMAL HIGH (ref 8–23)
CO2: 31 mmol/L (ref 22–32)
CO2: 28 mmol/L (ref 22–32)
Calcium: 8.5 mg/dL — ABNORMAL LOW (ref 8.9–10.3)
Calcium: 8.4 mg/dL — ABNORMAL LOW (ref 8.9–10.3)
Chloride: 109 mmol/L (ref 98–111)
Chloride: 108 mmol/L (ref 98–111)
Creatinine, Ser: 1.71 mg/dL — ABNORMAL HIGH (ref 0.61–1.24)
Creatinine, Ser: 1.54 mg/dL — ABNORMAL HIGH (ref 0.61–1.24)
GFR calc Af Amer: 48 mL/min — ABNORMAL LOW (ref >60)
GFR calc Af Amer: 54 mL/min — ABNORMAL LOW (ref >60)
GFR calc non Af Amer: 41 mL/min — ABNORMAL LOW (ref >60)
GFR calc non Af Amer: 47 mL/min — ABNORMAL LOW (ref >60)
Glucose, Bld: 174 mg/dL — ABNORMAL HIGH (ref 70–99)
Glucose, Bld: 194 mg/dL — ABNORMAL HIGH (ref 70–99)
Potassium: 3.8 mmol/L (ref 3.5–5.1)
Potassium: 3.5 mmol/L (ref 3.5–5.1)
Sodium: 152 mmol/L — ABNORMAL HIGH (ref 135–145)
Sodium: 148 mmol/L — ABNORMAL HIGH (ref 135–145)

## 2019-08-25 LAB — BLOOD CULTURE ID PANEL (REFLEXED)
Acinetobacter baumannii: NOT DETECTED
Candida albicans: NOT DETECTED
Candida glabrata: NOT DETECTED
Candida krusei: NOT DETECTED
Candida parapsilosis: DETECTED — AB
Candida tropicalis: NOT DETECTED
Enterobacter cloacae complex: NOT DETECTED
Enterobacteriaceae species: NOT DETECTED
Enterococcus species: NOT DETECTED
Escherichia coli: NOT DETECTED
Haemophilus influenzae: NOT DETECTED
Klebsiella oxytoca: NOT DETECTED
Klebsiella pneumoniae: NOT DETECTED
Listeria monocytogenes: NOT DETECTED
Neisseria meningitidis: NOT DETECTED
Proteus species: NOT DETECTED
Pseudomonas aeruginosa: NOT DETECTED
Serratia marcescens: NOT DETECTED
Staphylococcus aureus (BCID): NOT DETECTED
Staphylococcus species: NOT DETECTED
Streptococcus agalactiae: NOT DETECTED
Streptococcus pneumoniae: NOT DETECTED
Streptococcus pyogenes: NOT DETECTED
Streptococcus species: NOT DETECTED

## 2019-08-25 LAB — RENAL FUNCTION PANEL
Albumin: 2.4 g/dL — ABNORMAL LOW (ref 3.5–5.0)
Albumin: 2.6 g/dL — ABNORMAL LOW (ref 3.5–5.0)
Anion gap: 12 (ref 5–15)
Anion gap: 17 — ABNORMAL HIGH (ref 5–15)
BUN: 72 mg/dL — ABNORMAL HIGH (ref 8–23)
BUN: 76 mg/dL — ABNORMAL HIGH (ref 8–23)
CO2: 29 mmol/L (ref 22–32)
CO2: 24 mmol/L (ref 22–32)
Calcium: 8.2 mg/dL — ABNORMAL LOW (ref 8.9–10.3)
Calcium: 8.5 mg/dL — ABNORMAL LOW (ref 8.9–10.3)
Chloride: 109 mmol/L (ref 98–111)
Chloride: 110 mmol/L (ref 98–111)
Creatinine, Ser: 1.57 mg/dL — ABNORMAL HIGH (ref 0.61–1.24)
Creatinine, Ser: 1.57 mg/dL — ABNORMAL HIGH (ref 0.61–1.24)
GFR calc Af Amer: 53 mL/min — ABNORMAL LOW (ref >60)
GFR calc Af Amer: 53 mL/min — ABNORMAL LOW (ref >60)
GFR calc non Af Amer: 46 mL/min — ABNORMAL LOW (ref >60)
GFR calc non Af Amer: 46 mL/min — ABNORMAL LOW (ref >60)
Glucose, Bld: 204 mg/dL — ABNORMAL HIGH (ref 70–99)
Glucose, Bld: 196 mg/dL — ABNORMAL HIGH (ref 70–99)
Phosphorus: 4 mg/dL (ref 2.5–4.6)
Phosphorus: 4.4 mg/dL (ref 2.5–4.6)
Potassium: 3.8 mmol/L (ref 3.5–5.1)
Potassium: 4.2 mmol/L (ref 3.5–5.1)
Sodium: 150 mmol/L — ABNORMAL HIGH (ref 135–145)
Sodium: 151 mmol/L — ABNORMAL HIGH (ref 135–145)

## 2019-08-25 LAB — COMPREHENSIVE METABOLIC PANEL
ALT: 181 U/L — ABNORMAL HIGH (ref 0–44)
AST: 183 U/L — ABNORMAL HIGH (ref 15–41)
Albumin: 2.7 g/dL — ABNORMAL LOW (ref 3.5–5.0)
Alkaline Phosphatase: 499 U/L — ABNORMAL HIGH (ref 38–126)
Anion gap: 13 (ref 5–15)
BUN: 74 mg/dL — ABNORMAL HIGH (ref 8–23)
CO2: 30 mmol/L (ref 22–32)
Calcium: 8.2 mg/dL — ABNORMAL LOW (ref 8.9–10.3)
Chloride: 108 mmol/L (ref 98–111)
Creatinine, Ser: 1.63 mg/dL — ABNORMAL HIGH (ref 0.61–1.24)
GFR calc Af Amer: 50 mL/min — ABNORMAL LOW (ref >60)
GFR calc non Af Amer: 44 mL/min — ABNORMAL LOW (ref >60)
Glucose, Bld: 191 mg/dL — ABNORMAL HIGH (ref 70–99)
Potassium: 5.7 mmol/L — ABNORMAL HIGH (ref 3.5–5.1)
Sodium: 151 mmol/L — ABNORMAL HIGH (ref 135–145)
Total Bilirubin: 12.1 mg/dL — ABNORMAL HIGH (ref 0.3–1.2)
Total Protein: 6.7 g/dL (ref 6.5–8.1)

## 2019-08-25 LAB — TRIGLYCERIDES: Triglycerides: 244 mg/dL — ABNORMAL HIGH (ref <150)

## 2019-08-25 LAB — CBC
HCT: 28.8 % — ABNORMAL LOW (ref 39.0–52.0)
Hemoglobin: 8.1 g/dL — ABNORMAL LOW (ref 13.0–17.0)
MCH: 27.6 pg (ref 26.0–34.0)
MCHC: 28.1 g/dL — ABNORMAL LOW (ref 30.0–36.0)
MCV: 98.3 fL (ref 80.0–100.0)
Platelets: 392 10*3/uL (ref 150–400)
RBC: 2.93 MIL/uL — ABNORMAL LOW (ref 4.22–5.81)
RDW: 25.3 % — ABNORMAL HIGH (ref 11.5–15.5)
WBC: 19.4 10*3/uL — ABNORMAL HIGH (ref 4.0–10.5)
nRBC: 0.7 % — ABNORMAL HIGH (ref 0.0–0.2)

## 2019-08-25 LAB — MAGNESIUM
Magnesium: 2.4 mg/dL (ref 1.7–2.4)
Magnesium: 2.5 mg/dL — ABNORMAL HIGH (ref 1.7–2.4)

## 2019-08-25 LAB — APTT
aPTT: 143 seconds — ABNORMAL HIGH (ref 24–36)
aPTT: 80 seconds — ABNORMAL HIGH (ref 24–36)

## 2019-08-25 LAB — GLUCOSE, CAPILLARY
Glucose-Capillary: 126 mg/dL — ABNORMAL HIGH (ref 70–99)
Glucose-Capillary: 152 mg/dL — ABNORMAL HIGH (ref 70–99)
Glucose-Capillary: 175 mg/dL — ABNORMAL HIGH (ref 70–99)
Glucose-Capillary: 180 mg/dL — ABNORMAL HIGH (ref 70–99)
Glucose-Capillary: 198 mg/dL — ABNORMAL HIGH (ref 70–99)
Glucose-Capillary: 207 mg/dL — ABNORMAL HIGH (ref 70–99)

## 2019-08-25 LAB — BODY FLUID CULTURE: Gram Stain: NONE SEEN

## 2019-08-25 LAB — COOXEMETRY PANEL
Carboxyhemoglobin: 1.8 % — ABNORMAL HIGH (ref 0.5–1.5)
Methemoglobin: 1.4 % (ref 0.0–1.5)
O2 Saturation: 60.7 %
Total hemoglobin: 8.5 g/dL — ABNORMAL LOW (ref 12.0–16.0)

## 2019-08-25 LAB — LACTATE DEHYDROGENASE: LDH: 296 U/L — ABNORMAL HIGH (ref 98–192)

## 2019-08-25 MED ORDER — FENTANYL CITRATE (PF) 100 MCG/2ML IJ SOLN
25.0000 ug | INTRAMUSCULAR | Status: DC | PRN
  Administered 2019-08-26: 50 ug via INTRAVENOUS
  Administered 2019-08-26 – 2019-08-27 (×2): 100 ug via INTRAVENOUS
  Administered 2019-08-27: 50 ug via INTRAVENOUS
  Administered 2019-08-27 – 2019-08-28 (×3): 100 ug via INTRAVENOUS
  Administered 2019-08-31: 25 ug via INTRAVENOUS
  Administered 2019-08-31: 50 ug via INTRAVENOUS
  Administered 2019-08-31 – 2019-09-01 (×2): 25 ug via INTRAVENOUS
  Administered 2019-09-04: 50 ug via INTRAVENOUS
  Administered 2019-09-06: 100 ug via INTRAVENOUS
  Administered 2019-09-07: 50 ug via INTRAVENOUS
  Administered 2019-09-07: 100 ug via INTRAVENOUS
  Administered 2019-09-07 (×2): 50 ug via INTRAVENOUS
  Administered 2019-09-08: 100 ug via INTRAVENOUS
  Administered 2019-09-08 (×3): 50 ug via INTRAVENOUS
  Administered 2019-09-08 – 2019-09-12 (×12): 100 ug via INTRAVENOUS
  Administered 2019-09-12 (×2): 50 ug via INTRAVENOUS
  Administered 2019-09-13 (×5): 100 ug via INTRAVENOUS
  Administered 2019-09-14: 50 ug via INTRAVENOUS
  Administered 2019-09-14 – 2019-09-15 (×7): 100 ug via INTRAVENOUS
  Administered 2019-09-15 (×2): 50 ug via INTRAVENOUS
  Administered 2019-09-16 (×2): 100 ug via INTRAVENOUS
  Administered 2019-09-16 – 2019-09-17 (×2): 50 ug via INTRAVENOUS
  Administered 2019-09-18 – 2019-09-19 (×8): 100 ug via INTRAVENOUS
  Filled 2019-08-25 (×62): qty 2

## 2019-08-25 MED ORDER — MIDAZOLAM HCL 2 MG/2ML IJ SOLN
1.0000 mg | INTRAMUSCULAR | Status: DC | PRN
  Administered 2019-08-26: 2 mg via INTRAVENOUS
  Filled 2019-08-25: qty 2

## 2019-08-25 MED ORDER — FREE WATER
400.0000 mL | Status: DC
  Administered 2019-08-25 – 2019-08-27 (×12): 400 mL

## 2019-08-25 MED ORDER — MIDAZOLAM HCL (PF) 5 MG/ML IJ SOLN: 1.0000 mg | INTRAMUSCULAR | Status: DC | PRN

## 2019-08-25 MED ORDER — FUROSEMIDE 10 MG/ML IJ SOLN: 40.0000 mg | Freq: Every day | INTRAMUSCULAR | Status: DC

## 2019-08-25 MED ORDER — INSULIN DETEMIR 100 UNIT/ML ~~LOC~~ SOLN
5.0000 [IU] | Freq: Two times a day (BID) | SUBCUTANEOUS | Status: DC
  Administered 2019-08-25 – 2019-08-29 (×10): 5 [IU] via SUBCUTANEOUS
  Filled 2019-08-25 (×12): qty 0.05

## 2019-08-25 MED ORDER — FUROSEMIDE 10 MG/ML IJ SOLN
40.0000 mg | Freq: Once | INTRAMUSCULAR | Status: AC
  Administered 2019-08-25: 40 mg via INTRAVENOUS
  Filled 2019-08-25: qty 4

## 2019-08-25 MED ORDER — POTASSIUM CHLORIDE 10 MEQ/50ML IV SOLN
10.0000 meq | INTRAVENOUS | Status: AC
  Administered 2019-08-25 (×3): 10 meq via INTRAVENOUS
  Filled 2019-08-25 (×3): qty 50

## 2019-08-25 MED ORDER — POTASSIUM CHLORIDE 10 MEQ/50ML IV SOLN
10.0000 meq | INTRAVENOUS | Status: AC
  Administered 2019-08-25 (×4): 10 meq via INTRAVENOUS
  Filled 2019-08-25 (×4): qty 50

## 2019-08-25 MED ORDER — FLUCONAZOLE IN SODIUM CHLORIDE 400-0.9 MG/200ML-% IV SOLN
400.0000 mg | INTRAVENOUS | Status: AC
  Administered 2019-08-25 – 2019-09-18 (×25): 400 mg via INTRAVENOUS
  Filled 2019-08-25 (×25): qty 200

## 2019-08-25 NOTE — Progress Notes (Signed)
RT NOTE: Pt placed on 40% ATC and is tolerating well at this time. RT will continue to monitor.

## 2019-08-25 NOTE — Progress Notes (Signed)
RN received verbal order from Dr. Cornelius Moras to administer 4 10 meQ runs of potassium via central line.

## 2019-08-25 NOTE — Progress Notes (Signed)
      [address]       [place] [address]             [phone]        CARDIOTHORACIC SURGERY PROGRESS NOTE   R8 Days Post-Op Procedure(s) (LRB): REMOVAL OF IMPELLA LEFT VENTRICULAR ASSIST DEVICE (N/A) TRANSESOPHAGEAL ECHOCARDIOGRAM (TEE) (N/A)  Subjective: Reportedly tolerated TC trial x 8 hours yesterday Opens eyes and follows some simple commands  Objective: Vital signs: BP Readings from Last 1 Encounters:  08/25/19 (!) 116/40   Pulse Readings from Last 1 Encounters:  08/25/19 76   Resp Readings from Last 1 Encounters:  08/25/19 (!) 25   Temp Readings from Last 1 Encounters:  08/25/19 (!) 97.4 F (36.3 C) (Axillary)    Hemodynamics: CVP:  [6 mmHg-37 mmHg] 13 mmHg  Physical Exam:  Rhythm:   sinus  Breath sounds: Coarse but clear  Heart sounds:  RRR  Incisions:  Dry, intact  Abdomen:  Soft, non-distended, non-tender, + loose stool  Extremities:  Warm, well-perfused  Chest tubes:  increased volume thin serosanguinous output, no air leak   Intake/Output from previous day: 07/16 0701 - 07/17 0700 In: 2259.1 [I.V.:1177.5; NG/GT:150; IV Piggyback:931.7] Out: 4990 [Urine:2965; Drains:40; Stool:125; Chest Tube:1860] Intake/Output this shift: Total I/O In: -  Out: 245 [Urine:200; Chest Tube:45]  Lab Results:  CBC: Recent Labs    08/24/19 2043 08/25/19 0302  WBC 18.7* 19.4*  HGB 8.6* 8.1*  HCT 29.8* 28.8*  PLT 429* 392    BMET:  Recent Labs    08/25/19 0302 08/25/19 0801  NA 151* 152*  K 5.7* 3.8  CL 108 109  CO2 30 31  GLUCOSE 191* 174*  BUN 74* 77*  CREATININE 1.63* 1.71*  CALCIUM 8.2* 8.5*     PT/INR:  No results for input(s): LABPROT, INR in the last 72 hours.  CBG (last 3)  Recent Labs    08/24/19 2323 08/25/19 0330 08/25/19 0724  GLUCAP 157* 152* 126*    ABG    Component Value Date/Time   PHART 7.519 (H) 08/23/2019 1805   PCO2ART 52.0 (H) 08/23/2019 1805   PO2ART 108 08/23/2019 1805   HCO3 41.8 (H)  08/23/2019 1805   TCO2 43 (H) 08/23/2019 1805   ACIDBASEDEF 1.0 08/08/2019 1115   O2SAT 60.7 08/25/2019 0302    CXR: Mild diffuse opacity both lung fields, stable  Assessment/Plan: S/P Procedure(s) (LRB): REMOVAL OF IMPELLA LEFT VENTRICULAR ASSIST DEVICE (N/A) TRANSESOPHAGEAL ECHOCARDIOGRAM (TEE) (N/A)  Clinically stable since placement of percutaneous cholecystostomy tube Blood cultures drawn 7/14 now growing Candida tropicalis - Diflucan started, still on Zosyn Afebrile last 12 hours, WBC up slightly 19,400 Maintaining NSR w/ stable BP although still requiring Levophed and Vasopressin for BP support, milrinone @ 0.125, amiodarone @ 30 O2 sats 98-100% on 40% FiO2, tolerating TC trials Tolerating full support tube feeds Neuro status stable/improved Anemia stable, 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

## 2019-08-25 NOTE — Progress Notes (Signed)
Patient ID: Todd [name], male   DOB: [birth year], 64 y.o.   MRN: [id number]     Advanced Heart Failure Rounding Note  PCP-Cardiologist: No primary care provider on file.   Subjective:    08/02/19 CABG 08/03/19 VF arrest. Chest open at bedside 08/03/19 Back to OR for ECMO cannulation 08/03/19 Washout out for tamponade 08/04/19 Repeat bedside washout for tamponade 08/06/19 To OR for decannulation and Impella 5.5 08/07/19 Underwent emergent bedside chest exploration at bedside for tamponade  08/10/19 Chest closed 08/16/19 HIT positive, bivalirudin begun 08/17/19 Impella removed 08/20/19 Tracheostomy 08/24/19 cholecystostomy tube  milrinone 0.125, NE 22, vasopressin 0.02.  MAP stable.  Co-ox 61%.   In NSR on amiodarone at 30, no further NSVT.    He is now off Lasix gtt.  I/Os net negative with bolus Lasix, CVP 10-11 today.  Na still high 151, K high at 5.7 but not sure this is accurate.   Afebrile with WBCs 19.  Meropenem and [name] stopped 7/14, Zosyn started.  Now with Candida from BAL and blood culture, started on Diflucan.    Ileus, improving.  Now getting tube feeds via post-pyloric tube.  Off TPN.   Creatinine 1.46 => 1.53 => 1.35 => 1.57 => 1.71 => 1.4 => 1.69 => 1.63.  Patient now following commands at times for nurse. CT head negative 7/11. EEG with evidence for diffuse encephalopathy. NH3 38.   Jaundiced with consistently elevated bilirubin (direct), tbili decreasing at 12 today.  Abdominal US with gallbladder sludge, no definite cholecystitis.  HIDA scan with patent CBD but no gallbladder activity. Abdominal CT with gallbladder sludge. Now s/p cholecystostomy tube.   Objective:   Weight Range: 68.4 kg Body mass index is 22.27 kg/m.   Vital Signs:   Temp:  [97.4 F (36.3 C)-99.7 F (37.6 C)] 97.4 F (36.3 C) (07/17 0746) Pulse Rate:  [75-161] 75 (07/17 0720) Resp:  [13-36] 23 (07/17 0720) BP: (111-132)/(46-57) 126/48 (07/17 0720) SpO2:  [86 %-100 %] 100 % (07/17  0720) Arterial Line BP: (92-135)/(38-58) 127/50 (07/17 0245) FiO2 (%):  [40 %-100 %] 40 % (07/17 0720) Weight:  [68.4 kg] 68.4 kg (07/17 0500) Last BM Date: 08/24/19  Weight change: Filed Weights   08/23/19 0330 08/24/19 0500 08/25/19 0500  Weight: 71.7 kg 68.2 kg 68.4 kg    Intake/Output:   Intake/Output Summary (Last 24 hours) at 08/25/2019 0800 Last data filed at 08/25/2019 0700 Gross per 24 hour  Intake 2137.99 ml  Output 4740 ml  Net -2602.01 ml      Physical Exam    General: NAD, awake  Neck: JVP 9-10, no thyromegaly or thyroid nodule.  Lungs: Decreased at bases.  CV: Nondisplaced PMI.  Heart regular S1/S2, no S3/S4, no murmur.  1+ ankle edema.   Abdomen: Soft, nontender, no hepatosplenomegaly, no distention.  Skin: Jaundiced  Neurologic: Per nurse, will follow some commands.  Extremities: No clubbing or cyanosis.  HEENT: Normal.    Telemetry   NSR 80s.  Personally reviewed   Labs    CBC Recent Labs    08/24/19 2043 08/25/19 0302  WBC 18.7* 19.4*  HGB 8.6* 8.1*  HCT 29.8* 28.8*  MCV 98.3 98.3  PLT 429* 629   Basic Metabolic Panel Recent Labs    08/23/19 1537 08/23/19 1805 08/24/19 1459 08/24/19 1501 08/24/19 2053 08/25/19 0302  NA 150*   < > 150*   < > 151* 151*  K 4.2   < > 3.9   < > 3.7 5.7*  CL 98   < > 106   < > 106 108  CO2 35*   < > 28   < > 29 30  GLUCOSE 180*   < > 193*   < > 219* 191*  BUN 82*   < > 80*   < > 80* 74*  CREATININE 1.62*   < > 1.87*   < > 1.72* 1.63*  CALCIUM 8.5*   < > 8.6*   < > 9.0 8.2*  MG  --    < >  --   --  2.5* 2.4  PHOS 6.9*  --  6.6*  --   --   --    < > = values in this interval not displayed.   Liver Function Tests Recent Labs    08/24/19 0335 08/24/19 0335 08/24/19 1459 08/25/19 0302  AST 239*  --   --  183*  ALT 197*  --   --  181*  ALKPHOS 554*  --   --  499*  BILITOT 13.6*  --   --  12.1*  PROT 6.3*  --   --  6.7  ALBUMIN 2.9*   < > 3.1* 2.7*   < > = values in this interval not  displayed.   No results for input(s): LIPASE, AMYLASE in the last 72 hours. Cardiac Enzymes No results for input(s): CKTOTAL, CKMB, CKMBINDEX, TROPONINI in the last 72 hours.  BNP: BNP (last 3 results) Recent Labs    07/26/19 1236  BNP 2,568.2*    ProBNP (last 3 results) No results for input(s): PROBNP in the last 8760 hours.   D-Dimer No results for input(s): DDIMER in the last 72 hours. Hemoglobin A1C No results for input(s): HGBA1C in the last 72 hours. Fasting Lipid Panel Recent Labs    08/25/19 0302  TRIG 244*   Thyroid Function Tests No results for input(s): TSH, T4TOTAL, T3FREE, THYROIDAB in the last 72 hours.  Invalid input(s): FREET3  Other results:   Imaging    CT PERC CHOLECYSTOSTOMY  Result Date: 08/24/2019 INDICATION: Acute cholecystitis. Poor operative candidate. Please perform image guided placement cholecystostomy tube for infection source control purposes As the interventional radiology suite is currently being utilized for a complex endoleak procedure, we will proceed with placement of the cholecystostomy tube with ultrasound and CT guidance to facilitate drainage. EXAM: ULTRASOUND AND FLUOROSCOPIC-GUIDED CHOLECYSTOSTOMY TUBE PLACEMENT COMPARISON:  CT abdomen pelvis-08/23/2019; nuclear medicine HIDA scan-08/23/2018 MEDICATIONS: The patient is currently admitted to the hospital and on intravenous antibiotics. Antibiotics were administered within an appropriate time frame prior to skin puncture. ANESTHESIA/SEDATION: Moderate (conscious) sedation was employed during this procedure. A total of Versed 0.5 mg and Fentanyl 12.5 mcg was administered intravenously. Moderate Sedation Time: 20 minutes. The patient's level of consciousness and vital signs were monitored continuously by radiology nursing throughout the procedure under my direct supervision. CONTRAST:  None FLUOROSCOPY TIME:  Not provided COMPLICATIONS: None immediate. PROCEDURE: Informed written consent  was obtained from the patient's family after a discussion of the risks, benefits and alternatives to treatment. Questions regarding the procedure were encouraged and answered. A timeout was performed prior to the initiation of the procedure. The right upper abdominal quadrant was prepped and draped in the usual sterile fashion, and a sterile drape was applied covering the operative field. Maximum barrier sterile technique with sterile gowns and gloves were used for the procedure. A timeout was performed prior to the initiation of the procedure. Local anesthesia was provided with 1% lidocaine with  epinephrine. Ultrasound scanning of the right upper quadrant demonstrates a moderately dilated gallbladder with minimal amount of gallbladder wall thickening, however this is nonspecific finding in the setting known trace intra-abdominal ascites. Utilizing a transhepatic approach, an 18 gauge needle was advanced into the gallbladder under direct ultrasound guidance. An ultrasound image was saved for documentation purposes. Short Amplatz wire was coiled in gallbladder lumen. Appropriate positioning was confirmed with CT imaging. Next, the track was serially dilated allowing placement of a 10.2-French Cook cholecystomy tube into the gallbladder lumen where it was coiled and locked. Appropriate positioning was confirmed with CT imaging. Bile was aspirated and a small amount of aspirated bile was capped and sent to the laboratory for analysis. The catheter was secured to the skin with an interrupted suture, connected to a drainage bag and a dressing was applied. The patient tolerated the procedure well without immediate post procedural complication. IMPRESSION: Successful ultrasound and CT guided placement of a 10.2 French cholecystostomy tube. A small amount of aspirated bile was capped and sent to the laboratory for analysis Electronically Signed   By: Sandi Mariscal M.D.   On: 08/24/2019 14:18     Medications:      Scheduled Medications: . artificial tears   Both Eyes Q8H  . aspirin  81 mg Per Tube Daily  . chlorhexidine gluconate (MEDLINE KIT)  15 mL Mouth Rinse BID  . Chlorhexidine Gluconate Cloth  6 each Topical Daily  . collagenase   Topical Daily  . docusate  200 mg Per Tube Daily  . feeding supplement (PROSource TF)  45 mL Per Tube BID  . fentaNYL (SUBLIMAZE) injection  50 mcg Intravenous Once  . free water  300 mL Per Tube Q4H  . furosemide  40 mg Intravenous Daily  . insulin aspart  0-24 Units Subcutaneous Q4H  . insulin aspart  5 Units Subcutaneous Q4H  . ipratropium  0.5 mg Nebulization Q6H  . levalbuterol  1.25 mg Nebulization Q6H  . mouth rinse  15 mL Mouth Rinse 10 times per day  . pantoprazole (PROTONIX) IV  40 mg Intravenous QHS  . potassium chloride  40 mEq Per Tube BID  . sodium chloride flush  10-40 mL Intracatheter Q12H  . sodium chloride flush  10-40 mL Intracatheter Q12H  . sodium chloride flush  3 mL Intravenous Q12H  . sodium chloride flush  5 mL Intracatheter Q8H    Infusions: . sodium chloride 250 mL (08/13/19 1412)  . sodium chloride Stopped (08/13/19 1631)  . amiodarone 30 mg/hr (08/25/19 0600)  . bivalirudin (ANGIOMAX) infusion 0.5 mg/mL (Non-ACS indications) 0.055 mg/kg/hr (08/25/19 0600)  . epinephrine Stopped (08/19/19 1251)  . feeding supplement (PIVOT 1.5 CAL) 1,000 mL (08/24/19 1527)  . fentaNYL infusion INTRAVENOUS Stopped (08/21/19 0610)  . fluconazole (DIFLUCAN) IV Stopped (08/25/19 0525)  . lactated ringers 20 mL/hr at 08/10/19 1705  . lactated ringers 10 mL/hr at 08/18/19 0054  . milrinone 0.125 mcg/kg/min (08/25/19 0600)  . norepinephrine (LEVOPHED) Adult infusion 22 mcg/min (08/25/19 0600)  . piperacillin-tazobactam (ZOSYN)  IV 3.375 g (08/25/19 0602)  . vasopressin 0.02 Units/min (08/25/19 0600)    PRN Medications: sodium chloride, sodium chloride, dextrose, fentaNYL, metoprolol tartrate, midazolam, morphine injection, ondansetron  (ZOFRAN) IV, sodium chloride flush, sodium chloride flush, sodium chloride flush   Assessment/Plan   1. Shock: At this point, think mixed cardiogenic/septic.  Echo with EF 20-25%, moderate RV dysfunction.  VA ECMO post-VF arrest, cannulated 6/25.  Stable s/p return to OR for mediastinal re-exploration due  to bleeding on 6/25.  Decannulated from [place] ECMO on 08/06/19. Impella 5.5 placed. Underwent emergent chest re-exploration on 6/29 for tamponade and clot removal. Chest closed 7/2.  Impella 5.5 removed 7/9.  This morning, he is on NE 22, milrinone 0.125, vasopressin 0.02.  Suspect component of septic/vasodilatory shock with prior fever, elevated WBCs.  Co-ox 61%, CVP 10-11.  Creatinine 1.63 today, stable.  - Continue pressor wean, will try to stop vasopressin.  - Lasix 40 mg IV x 1 today. Resend BMET stat, surprised if K is truly elevated.  2. 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  Prolonged VF arrest on 6/25. Chest closed 7/2.  - ASA  - Hold Crestor with elevated LFTs.  3. Cardiac arrest: VF arrest with prolonged code. Still with episodes of VT/NSVT in setting of low K (difficult to effectively replace).  - Continue amiodarone 30 mg/hr.   - Eventual evaluation for ICD or lifevest at d/c - Keep K > 4.0 Mg > 2.0 4. Acute hypoxemic respiratory failure: In setting of cardiac arrest.  H/o COPD.  Bibasilar infiltrates, possible HCAP.  Now with tracheostomy. On Zosyn + diflucan with candida in BAL and blood culture.  5. Acute blood loss anemia: Now s/p mediastinal re-exploration x 3. Hgb stable today.  CT drainage has slowed.   - Now on bivalirudin gtt. 6. 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  Platelets have trended back up.  7. ID: Suspect HCAP initially, on meropenem/anidulafungin/vanco initially with ongoing fevers and leukocytosis. Venous dopplers negative for DVT. Possible drug fever from meropenem, he was switched to Zosyn on 7/14 and is now  afebrile.  He had abdominal US with gallbladder sludge, no definite cholecystitis.  HIDA scan done, GB does not opacify => ?acalculous cholecystitis => now with cholecystostomy tube.  Most recently, Candida in blood culture and BAL, he is on Diflucan.   - ID following.  - Continue Zosyn + Diflucan.  8. Elevated bilirubin: Mostly direct = likely cholestatic/shock liver/RV failure predominantly.   Abdominal US with evidence for cirrhosis, sludge in GB but no definite acute cholecystitis. NH3 was not significantly elevated. Tbili 12 today, lower.  HIDA scan showed patent common bile duct, so not obstructive stone causing elevated bilirubin.  9. Ileus: Improved.  Getting TFs.  10. Atrial fibrillation/flutter: Paroxysmal. Back in NSR today.     - Continue amiodarone gtt.  - bivalirudin gtt.  11. Neuro: Now following commands per nursing. CT head negative on 7/11. EEG with e/o diffuse encephalopathy.  NH3 38.  12. Hypernatremia: Increase free water boluses to 400 cc q4 hrs.   CRITICAL CARE Performed by: Loralie Champagne  Total critical care time: 40 minutes at bedside  Critical care time was exclusive of separately billable procedures and treating other patients.  Critical care was necessary to treat or prevent imminent or life-threatening deterioration.  Critical care was time spent personally by me on the following activities: development of treatment plan with patient and/or surrogate as well as nursing, discussions with consultants, evaluation of patient's response to treatment, examination of patient, obtaining history from patient or surrogate, ordering and performing treatments and interventions, ordering and review of laboratory studies, ordering and review of radiographic studies, pulse oximetry and re-evaluation of patient's condition.   Length of Stay: [place], MD  08/25/2019, 8:00 AM

## 2019-08-25 NOTE — Progress Notes (Signed)
[facility] BRIEF SICU PROGRESS NOTE  8 Days Post-Op  S/P Procedure(s) (LRB): REMOVAL OF IMPELLA LEFT VENTRICULAR ASSIST DEVICE (N/A) TRANSESOPHAGEAL ECHOCARDIOGRAM (TEE) (N/A)   Stable day Potassium low  Plan: Continue current plan.  Supplement potassium  Purcell Nails, MD 08/25/2019 5:23 PM

## 2019-08-25 NOTE — Progress Notes (Signed)
PHARMACY - PHYSICIAN COMMUNICATION CRITICAL VALUE ALERT - BLOOD CULTURE IDENTIFICATION (BCID)  Todd [name] is an 64 y.o. male who presented to [facility] on 07/26/2019 with a chief complaint of s/p CABG with multiple compliactions  Assessment:   -1/2 Blood cultures from 7/14 now growing Candida Parapsilosis  -Pt has been intermittently febrile up to 102.2 -WBC has remained elevated -Pt was previously on [name] from 7/7-714 and this still grew after 7 days of therapy. Discussed with Dr. Cornelius Moras, will try Fluconazole for now, ID will get automatic consult   Name of physician (or Provider) Contacted: Dr. Cornelius Moras   Current antibiotics: Zosyn  Changes to prescribed antibiotics recommended:  Add Fluconazole 400 mg IV q24h Automatic ID consult in place  Results for orders placed or performed during the hospital encounter of 07/26/19  Blood Culture ID Panel (Reflexed) (Collected: 08/22/2019 12:58 PM)  Result Value Ref Range   Enterococcus species NOT DETECTED NOT DETECTED   Listeria monocytogenes NOT DETECTED NOT DETECTED   Staphylococcus species NOT DETECTED NOT DETECTED   Staphylococcus aureus (BCID) NOT DETECTED NOT DETECTED   Streptococcus species NOT DETECTED NOT DETECTED   Streptococcus agalactiae NOT DETECTED NOT DETECTED   Streptococcus pneumoniae NOT DETECTED NOT DETECTED   Streptococcus pyogenes NOT DETECTED NOT DETECTED   Acinetobacter baumannii NOT DETECTED NOT DETECTED   Enterobacteriaceae species NOT DETECTED NOT DETECTED   Enterobacter cloacae complex NOT DETECTED NOT DETECTED   Escherichia coli NOT DETECTED NOT DETECTED   Klebsiella oxytoca NOT DETECTED NOT DETECTED   Klebsiella pneumoniae NOT DETECTED NOT DETECTED   Proteus species NOT DETECTED NOT DETECTED   Serratia marcescens NOT DETECTED NOT DETECTED   Haemophilus influenzae NOT DETECTED NOT DETECTED   Neisseria meningitidis NOT DETECTED NOT DETECTED   Pseudomonas aeruginosa NOT DETECTED NOT DETECTED   Candida albicans  NOT DETECTED NOT DETECTED   Candida glabrata NOT DETECTED NOT DETECTED   Candida krusei NOT DETECTED NOT DETECTED   Candida parapsilosis DETECTED (A) NOT DETECTED   Candida tropicalis NOT DETECTED NOT DETECTED    Abran Duke, PharmD, BCPS Clinical Pharmacist Phone: [phone]

## 2019-08-25 NOTE — Progress Notes (Signed)
NAME:  Todd [name], MRN:  [id number], DOB:  [birth year], LOS: 62 ADMISSION DATE:  07/26/2019, CONSULTATION DATE:  08/03/19 REFERRING MD:  Roxan Hockey, CHIEF COMPLAINT:  ECMO   Brief History   VA ECMO for post CABG Vtach arrest  History of present illness   Presented with worsening dyspnea 6/17 c/w CHF exacerbation Cath with severe triple vessel disease Underwent CABG 6/24 Vtach arrest 6/25 Chest opened bedside and cardiac massage initiated as well as multiple cardioversions, amiodarone, bicarb etc Brought to OR and cannulated for VA ECMO PCCM consulted to assist with management Comorbidities include DM, heavy smoking, COPD  Past Medical History  Depression Ischemic cardiomyopathy HTN [facility] Events   6/24 CABG 6/25 VA cannulation 6/28 decannulated and impella placed 6/29 bedside re-exploration; s/p decannulation. Placement of impella device originally at p8 but decreased to p4 2/2 suction events overnight up to p6. Echo completed and repositioned device. Remained on considerable support with 8 epi and 46 norepi vaso 0.05. continued chest tube output with large clots noted. Heparin thru device. Replacement products ongoing. 60% 8 peep 6/30: bedside mediastinal re-exploration and clean out of hematoma with tamponade and worsening hemodynamics. Pt had large volume transfusion. rebolused with amio 2/2 nsvt episode.  Weight up 48 pounds.  Started diuresis, Lasix drip started. 7/1 iatrogenic respiratory alkalosis, vent rate decreased. 7/2: No significant issues overnight remains on pressors and Impella not tolerating tube feeds with high gastric output awaiting core track placement 7/3: started trickle TF  7/5: Swab removed, CVL and PICC placed. 7/13 Trach and BAL  7/16 - No events. Awake this AM and tracking. Still won't follow commands for me. Still having fevers. Successful ultrasound and CT guided placement of a 10.2 French cholecystostomy tube. A small amount of  aspirated bile was capped and sent to the laboratory for analysis     Consults:  Advanced heart failure, PCCM, TCTS  Procedures:  See below  Significant Diagnostic Tests:  6/22 spirometry with restrictive physiology, preserved FEV1/FVC CXR today with bilateral airspace disease, cannulas in good position, deep left sulcus that was present yesterday, no clear PTX 6/18 echo: LVEF 76-16%, grade I diastolic dysfunction 7/7: US Abdomen cholelithiasis without cholecystitis. Mild wall thinking in setting of liver disease and ascites.   Micro Data:  COVID and HIV Neg 7/3 respiratory-few Candida tropicalis 7/5 blood>> 7/5 respiratory-few Candida tropicalis 7/7 urine-NG 7/7 blood>>NG 7/9 wound>> NG 7/12 BAL> Candida tropicalis detected 08/22/2019: Blood BC ID Candida parapsilosis detected  Antimicrobials:  Vanc6/24>>7/13 Cefuroxime 6/24>> 6/25 merrem 6/26->7/14 Anidulafungin 7/8->7/14 Zosyn 7/14>> xxxxxxxx Diflucan 08/25/2019 >>  Interim history/subjective:   7/17 - off sedation gtt. doing pressure support ventilation.  Beginning to follow some commands occasionally intermittently.  Very jaundiced.  Growing Candida in the blood and BAL.  Still febrile but T-max is coming down.  White count persistently elevated; currently at 19.4 okay.  Diflucan started.  Currently on milrinone GTT, amiodarone gtt., vasopressin gtt., Levophed gtt. and bivalrudin gtt. Off epi gtt. Cards planning lasix x 1 today.  Objective   Blood pressure (!) 126/48, pulse 75, temperature (!) 97.4 F (36.3 C), temperature source Axillary, resp. rate (!) 23, height _0  (1.753 m), weight 68.4 kg, SpO2 100 %. CVP:  [6 mmHg-37 mmHg] 13 mmHg  Vent Mode: PSV;CPAP FiO2 (%):  [40 %-100 %] 40 % Set Rate:  [20 bmp] 20 bmp Vt Set:  [420 mL] 420 mL PEEP:  [5 cmH20-8 cmH20] 5 cmH20 Pressure Support:  [5 cmH20-10 cmH20] 10 cmH20  Plateau Pressure:  [9 cmH20-15 cmH20] 15 cmH20   Intake/Output Summary (Last 24 hours) at  08/25/2019 0810 Last data filed at 08/25/2019 0700 Gross per 24 hour  Intake 2137.99 ml  Output 4740 ml  Net -2602.01 ml   Filed Weights   08/23/19 0330 08/24/19 0500 08/25/19 0500  Weight: 71.7 kg 68.2 kg 68.4 kg   General Appearance: Jaundiced deconditioned male with tracheostomy Head:  Normocephalic, without obvious abnormality, atraumatic Eyes:  PERRL -yes, conjunctiva/corneas -jaundiced     Ears:  Normal external ear canals, both ears Nose:  G tube -yes Throat:  ETT TUBE -no, OG tube - no.  Tracheostomy present Neck:  Supple,  No enlargement/tenderness/nodules Lungs: Clear to auscultation bilaterally, Ventilator   Synchrony -yes but currently on pressure support Heart:  S1 and S2 normal, no murmur, CVP - no.  Pressors - no Abdomen:  Soft, no masses, no organomegaly Genitalia / Rectal:  Not done Extremities:  Extremities-intact Skin:  ntact in exposed areas . Sacral area -not examined Neurologic:  Sedation - none -> RASS - -3 . Moves all 4s - weak. CAM-ICU - cannot test . Orientation - cannot test      Resolved Hospital Problem list   N/A  Assessment & Plan:   Cardiogenic/hemorrhagic shock status post CABG complicated by VT arrest, EF 20 to 25% with severe LV dysfunction and moderate RV dysfunction.  S/p VA ECMO (decanulated 6/28). 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  Off epinephrine drip.  Cardiology planning Lasix x1.  Plan -Continue amiodarone infusion, milrinone infusion, Levophed and vasopressin infusion -Continue aspirin  -Appears to be off statin secondary to?  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 -Medications being managed by cardiology and primary services  Acute hypoxic respiratory failure w/ need for mechanical ventilation; -prolonged  mechanical ventilation status post chronic respiratory failure tracheostomy August 20, 2019  08/25/2019: On tracheostomy and doing pressure support  Plan -Pressure support as tolerated for a few hours and then rest back on full ventilator support - VAP prevention bundle  Shock liver Hyperbilirubinemia Question acalculous cholecystitis Cirrhosis per 7/8 [place] -status post on August 24, 2019  percutaneous GB drain given ongoing fevers and findings on HIDA scan  -08/25/2019: Improved bilirubin and liver enzymes and INR  Plan  - monitor  -Avoid hepatotoxic agents  T2DM -SSI for now, restart levemir once TF restarted   Fever curve- cultures negative to date. LE duplex neb. Switched to zosyn 7/14 by ID  08/25/2019: Growing Candida of 2 different subtypes in the BAL and blood from August 20, 2019  Plan  -Start Diflucan -requires QTC monitoring.  Check twelve-lead EKG August 26, 2019 -Continue Zosyn -Infectious disease consult appreciated   Acute blood loss anemia and critical illness anemia Thrombocytopenia secondary to heparin-induced thrombocytopenia  08/25/2019: Hemoglobin greater than 8.  Platelets normal on bivalaruding  plan -Continue bivalirudin -Continue to monitor CBCs - - PRBC for hgb </= 6.9gm%    - exceptions are   -  if ACS susepcted/confirmed then transfuse for hgb </= 8.0gm%,  or    -  active bleeding with hemodynamic instability, then transfuse regardless of hemoglobin value   At at all times try to transfuse 1 unit prbc as possible with exception of active hemorrhage  Acute kidney injury  08/25/2019: Improving  Plan  -Maintain  hemodynamics and avoid nephrotoxic agents -Correct electrolyte imbalance as needed  Electrolyte imbalance  08/25/2019: Hypernatremia improving/stable/mild to moderate.  Effect of Lasix  Plan  -Correct with free water if in the severe category   Acute encephalopathy due to sedation- sedation off, EEG/CT head/ammonia do not  explain  08/25/2019:- Overall seems to be improving, beginning to follow commands intermittently and occasionally  plan - can consider MRI brain at some point when pacer wires out  Small left adrenal mass seen on CT scan of the abdomen August 24, 2019  Plan -Repeat imaging in July 2022   Best practice:  Diet: Tube feeds on hold for perc drain Pain/Anxiety/Delirium protocol (if indicated): PRN only VAP protocol (if indicated): in place DVT prophylaxis: bival GI prophylaxis: PPI Glucose control: see above Mobility: BR Code Status: Full Family Communication: per primary Disposition:  ICU   ATTESTATION & SIGNATURE   The patient Rahn Lacuesta is critically ill with multiple organ systems failure and requires high complexity decision making for assessment and support, frequent evaluation and titration of therapies, application of advanced monitoring technologies and extensive interpretation of multiple databases.   Critical Care Time devoted to patient care services described in this note is  35  Minutes. This time reflects time of care of this signee Dr Brand Males. This critical care time does not reflect procedure time, or teaching time or supervisory time of PA/NP/Med student/Med Resident etc but could involve care discussion time     Dr. Brand Males, M.D., [facility].C.P Pulmonary and Critical Care Medicine Staff Physician [place] [facility] Pager: [phone], If no answer or between  15:00h - 7:00h: call [phone]  08/25/2019 8:28 AM     LABS    PULMONARY Recent Labs  Lab 08/18/19 1241 08/19/19 0345 08/22/19 0349 08/23/19 0312 08/23/19 1805 08/24/19 0335 08/25/19 0302  PHART 7.452*  --   --   --  7.519*  --   --   PCO2ART 53.4*  --   --   --  52.0*  --   --   PO2ART 85  --   --   --  108  --   --   HCO3 37.0*  --   --   --  41.8*  --   --   TCO2 39*  --   --   --  43*  --   --   O2SAT 96.0   < > 71.6 55.5 98.0  63.8 60.7   < > = values in this interval not displayed.    CBC Recent Labs  Lab 08/24/19 0929 08/24/19 2043 08/25/19 0302  HGB 8.2* 8.6* 8.1*  HCT 27.9* 29.8* 28.8*  WBC 18.4* 18.7* 19.4*  PLT 370 429* 392    COAGULATION Recent Labs  Lab 08/20/19 1202  INR 1.6*    CARDIAC  No results for input(s): TROPONINI in the last 168 hours. No results for input(s): PROBNP in the last 168 hours.   CHEMISTRY Recent Labs  Lab 08/22/19 0349 08/22/19 1250 08/22/19 1400 08/22/19 1400 08/22/19 1651 08/22/19 1651 08/23/19 0312 08/23/19 0312 08/23/19 1537 08/23/19 1805 08/24/19 0335 08/24/19 0335 08/24/19 1459 08/24/19 1459 08/24/19 1501 08/24/19 1501 08/24/19 2053 08/25/19 0302  NA 150*   < > 148*   < > 151*   < > 152*   < > 150*   < > 152*  --  150*  --  150*  --  151* 151*  K 3.0*   < > 3.5   < > 3.2*   < > 3.1*   < > 4.2   < > 2.7*   < > 3.9   < > 3.9   < > 3.7 5.7*  CL 94*   < > 98   < > 96*   < > 99   < > 98   < > 102  --  106  --  106  --  106 108  CO2 38*   < > 40*   < > 40*   < > 41*   < > 35*   < > 35*  --  28  --  28  --  29 30  GLUCOSE 140*   < > 226*   < > 199*   < > 104*   < > 180*   < > 78  --  193*  --  192*  --  219* 191*  BUN 74*   < > 72*   < > 75*   < > 76*   < > 82*   < > 77*  --  80*  --  81*  --  80* 74*  CREATININE 1.71*   < > 1.46*   < > 1.57*   < > 1.40*   < > 1.62*   < > 1.69*  --  1.87*  --  1.87*  --  1.72* 1.63*  CALCIUM 8.2*   < > 7.7*   < > 8.2*   < > 7.9*   < > 8.5*   < > 9.1  --  8.6*  --  8.6*  --  9.0 8.2*  MG 2.5*  --   --   --   --   --  2.5*  --   --   --  2.7*  --   --   --   --   --  2.5* 2.4  PHOS  --   --  4.9*  --  5.8*  --  5.1*  --  6.9*  --   --   --  6.6*  --   --   --   --   --    < > = values in this interval not displayed.   Estimated Creatinine Clearance: 43.7 mL/min (A) (by C-G formula based on SCr of 1.63 mg/dL (H)).   LIVER Recent Labs  Lab 08/20/19 1202 08/20/19 1507 08/21/19 0406 08/21/19 1700 08/22/19 0349  08/22/19 1400 08/23/19 0312 08/23/19 1537 08/24/19 0335 08/24/19 1459 08/25/19 0302  AST  --   --  597*  --  554*  --  359*  --  239*  --  183*  ALT  --   --  353*  --  317*  --  267*  --  197*  --  181*  ALKPHOS  --   --  925*  --  860*  --  675*  --  554*  --  499*  BILITOT  --   --  16.1*  --  18.9*  --  14.9*  --  13.6*  --  12.1*  PROT  --   --  5.8*  --  6.0*  --  5.7*  --  6.3*  --  6.7  ALBUMIN  --    < > 2.2*   < > 2.4*   < > 1.9* 2.5* 2.9* 3.1* 2.7*  INR 1.6*  --   --   --   --   --   --   --   --   --   --    < > =  values in this interval not displayed.     INFECTIOUS Recent Labs  Lab 08/20/19 1202  PROCALCITON 2.01     ENDOCRINE CBG (last 3)  Recent Labs    08/24/19 2323 08/25/19 0330 08/25/19 0724  GLUCAP 157* 152* 126*         IMAGING x48h  - image(s) personally visualized  -   highlighted in bold CT ABDOMEN W CONTRAST  Result Date: 08/23/2019 CLINICAL DATA:  Cholecystitis. Fever and leukocytosis. Elevated bilirubin. EXAM: CT ABDOMEN WITH CONTRAST TECHNIQUE: Multidetector CT imaging of the abdomen was performed using the standard protocol following bolus administration of intravenous contrast. CONTRAST:  66m OMNIPAQUE IOHEXOL 300 MG/ML  SOLN COMPARISON:  07/30/2014 from [facility] Lower chest: Small bilateral pleural effusions and dependent bibasilar atelectasis. Hepatobiliary: No hepatic masses identified. High attenuation sludge is seen in the gallbladder, however there are no findings of acute cholecystitis or biliary ductal dilatation. Pancreas:  No mass or inflammatory changes. Spleen:  Within normal limits in size and appearance. Adrenals/Urinary Tract: 1.4 cm left adrenal mass is seen which is nonspecific, but most likely represents a benign adenoma in patient without history of cancer. Moderate left renal atrophy is seen. No evidence of renal masses or hydronephrosis. Stomach/Bowel: A feeding tube is seen with tip in the proximal  jejunum. No evidence of dilated bowel loops. Mild diffuse mesenteric edema and ascites is seen, but there is no evidence of focal inflammatory process or abscess. Vascular/Lymphatic: No pathologically enlarged lymph nodes identified. No abdominal aortic aneurysm. Aortic atherosclerosis noted. Other:  None. Musculoskeletal:  No suspicious bone lesions identified. IMPRESSION: Gallbladder sludge. No radiographic evidence of acute cholecystitis or biliary ductal dilatation. Mild ascites and diffuse mesenteric edema. No evidence of focal inflammatory process or abscess. Small bilateral pleural effusions and dependent bibasilar atelectasis. Small indeterminate left adrenal mass. If patient has no history of malignancy, this is probably a benign adenoma and follow-up by CT is recommended in 12 months. If patient does have cancer history, adrenal protocol abdomen CT without and with contrast is recommended now for further characterization. This recommendation follows ACR consensus guidelines: Management of Incidental Adrenal Masses: A White Paper of the ACR Incidental Findings Committee. J Am Coll Radiol 2017;14:1038-1044. Aortic Atherosclerosis (ICD10-I70.0). Electronically Signed   By: JMarlaine HindM.D.   On: 08/23/2019 17:54   NM Hepatobiliary Liver Func  Result Date: 08/23/2019 CLINICAL DATA:  Cholecystitis scratch set evaluate cholecystitis. Elevated bilirubin. EXAM: NUCLEAR MEDICINE HEPATOBILIARY IMAGING TECHNIQUE: Sequential images of the abdomen were obtained out to 60 minutes following intravenous administration of radiopharmaceutical. RADIOPHARMACEUTICALS:  4.9 mCi Tc-962mCholetec IV COMPARISON:  Abdominal sonogram from 08/16/2019 FINDINGS: Prompt uptake of activity by the liver is seen. Delayed biliary activity is noted beginning around 25 minutes post injection. Biliary activity passes into small bowel, consistent with patent common bile duct. After 1 hour no gallbladder activity is identified and there is  persistent diffuse radiotracer activity throughout the liver. The patient subsequently received 3 mg of morphine, IV and continuous imaging was performed for an additional 30 minutes. No gallbladder activity identified following morphine administration. IMPRESSION: 1. No gallbladder activity identified after 90 minutes of imaging and following IV administration of 3 mg of morphine. Patency of the cystic duct cannot be confirmed. 2. There is delayed biliary activity occurring approximately 25 minutes post injection of the radiopharmaceutical. In addition, there is persistence of radiotracer activity throughout the liver following 60 minutes and 90 minutes of imaging which suggest some degree of  hepatic dysfunction. 3. Biliary to bowel activity is identified confirming patency of the common bile duct. Electronically Signed   By: Kerby Moors M.D.   On: 08/23/2019 11:23   CT PERC CHOLECYSTOSTOMY  Result Date: 08/24/2019 INDICATION: Acute cholecystitis. Poor operative candidate. Please perform image guided placement cholecystostomy tube for infection source control purposes As the interventional radiology suite is currently being utilized for a complex endoleak procedure, we will proceed with placement of the cholecystostomy tube with ultrasound and CT guidance to facilitate drainage. EXAM: ULTRASOUND AND FLUOROSCOPIC-GUIDED CHOLECYSTOSTOMY TUBE PLACEMENT COMPARISON:  CT abdomen pelvis-08/23/2019; nuclear medicine HIDA scan-08/23/2018 MEDICATIONS: The patient is currently admitted to the hospital and on intravenous antibiotics. Antibiotics were administered within an appropriate time frame prior to skin puncture. ANESTHESIA/SEDATION: Moderate (conscious) sedation was employed during this procedure. A total of Versed 0.5 mg and Fentanyl 12.5 mcg was administered intravenously. Moderate Sedation Time: 20 minutes. The patient's level of consciousness and vital signs were monitored continuously by radiology nursing  throughout the procedure under my direct supervision. CONTRAST:  None FLUOROSCOPY TIME:  Not provided COMPLICATIONS: None immediate. PROCEDURE: Informed written consent was obtained from the patient's family after a discussion of the risks, benefits and alternatives to treatment. Questions regarding the procedure were encouraged and answered. A timeout was performed prior to the initiation of the procedure. The right upper abdominal quadrant was prepped and draped in the usual sterile fashion, and a sterile drape was applied covering the operative field. Maximum barrier sterile technique with sterile gowns and gloves were used for the procedure. A timeout was performed prior to the initiation of the procedure. Local anesthesia was provided with 1% lidocaine with epinephrine. Ultrasound scanning of the right upper quadrant demonstrates a moderately dilated gallbladder with minimal amount of gallbladder wall thickening, however this is nonspecific finding in the setting known trace intra-abdominal ascites. Utilizing a transhepatic approach, an 18 gauge needle was advanced into the gallbladder under direct ultrasound guidance. An ultrasound image was saved for documentation purposes. Short Amplatz wire was coiled in gallbladder lumen. Appropriate positioning was confirmed with CT imaging. Next, the track was serially dilated allowing placement of a 10.2-French Cook cholecystomy tube into the gallbladder lumen where it was coiled and locked. Appropriate positioning was confirmed with CT imaging. Bile was aspirated and a small amount of aspirated bile was capped and sent to the laboratory for analysis. The catheter was secured to the skin with an interrupted suture, connected to a drainage bag and a dressing was applied. The patient tolerated the procedure well without immediate post procedural complication. IMPRESSION: Successful ultrasound and CT guided placement of a 10.2 French cholecystostomy tube. A small amount of  aspirated bile was capped and sent to the laboratory for analysis Electronically Signed   By: Sandi Mariscal M.D.   On: 08/24/2019 14:18

## 2019-08-25 NOTE — Progress Notes (Addendum)
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+  Labs: Recent Labs    08/24/19 0929 08/24/19 1459 08/24/19 1501 08/24/19 1833 08/24/19 2043 08/24/19 2053 08/25/19 0302 08/25/19 0555  HGB 8.2*  --   --   --  8.6*  --  8.1*  --   HCT 27.9*  --   --   --  29.8*  --  28.8*  --   PLT 370  --   --   --  429*  --  392  --   APTT  --   --   --    < >  --  78* 143* 80*  CREATININE  --    < > 1.87*  --   --  1.72* 1.63*  --    < > = values in this interval not displayed.    Assessment: 64 yo male s/p ECMO and Impella. Chest closed 7/2. CT drainage decreased.    HIT ab sent a few days ago and returned as 2.6 - positive, switched from heparin to bivalirudin. SRA positive, confirmed for HIT.  APTT (80) remains therapeutic on bivalirudin 0.055 mg/kg/hr. Hgb stable 8.1, platelets stable in 300s. No overt bleeding or infusion issues per RN.   Goals of Therapy: APTT 50-85 secs  Plan: Continue bivalirudin at 0.055 mg/kg/hr Monitor daily aPTT, CBC, and for s/sx of bleeding  Tama Headings, PharmD PGY2 Cardiology Pharmacy Resident Phone: [phone] 08/25/2019  9:54 AM  Please check [email or web address] for unit-specific pharmacy phone numbers.

## 2019-08-25 NOTE — Progress Notes (Signed)
Referring Physician(s): Dr Lavinia Sharps  Supervising Physician: Gilmer Mor  Patient Status:  [facility] - In-pt  Chief Complaint:  cholecystitis  Subjective:  CT guided placement of a 10.2 French cholecystostomy tube placed in IR yesterday with Dr Grace Isaac  Pt in ICU Wife at bedside Seems comfortable Pt nonverbal but does try to respond to wife Can feel pain in abd - when palpating site  OP black fluid    Allergies: Empagliflozin, Heparin, Other, Simvastatin, and Sitagliptin  Medications: Prior to Admission medications   Medication Sig Start Date End Date Taking? Authorizing Provider  citalopram (CELEXA) 40 MG tablet Take 40 mg by mouth daily.   Yes [provider]  dapagliflozin propanediol (FARXIGA) 10 MG TABS tablet Take 10 mg by mouth daily.   Yes [provider]  ergocalciferol (VITAMIN D2) 1.25 MG (50000 UT) capsule Take 50,000 Units by mouth 2 (two) times a week.   Yes [provider]  furosemide (LASIX) 20 MG tablet Take 40 mg by mouth daily. 07/16/19  Yes [provider]  glipiZIDE-metformin (METAGLIP) 2.5-500 MG tablet Take 2 tablets by mouth 2 (two) times daily with a meal.   Yes [provider]  ibuprofen (ADVIL) 200 MG tablet Take 400 mg by mouth 2 (two) times daily as needed for moderate pain.   Yes [provider]  levofloxacin (LEVAQUIN) 750 MG tablet Take 750 mg by mouth daily. For 14 days.   Yes [provider]  lisinopril (ZESTRIL) 20 MG tablet Take 20 mg by mouth daily.   Yes [provider]  oxyCODONE (ROXICODONE) 15 MG immediate release tablet Take 15 mg by mouth 3 (three) times daily.   Yes [provider]  pioglitazone (ACTOS) 45 MG tablet Take 45 mg by mouth daily.   Yes [provider]  promethazine (PHENERGAN) 25 MG tablet Take 25 mg by mouth 2 (two) times daily as needed for nausea or vomiting.   Yes [provider]     Vital Signs: BP (!) 107/48  (BP Location: Other (Comment)) Comment (BP Location): a line  Pulse 85   Temp 99.1 F (37.3 C) (Rectal)   Resp (!) 26   Ht 5\' 9"  (1.753 m)   Wt 150 lb 12.7 oz (68.4 kg)   SpO2 97%   BMI 22.27 kg/m   Physical Exam Vitals reviewed.  Skin:    General: Skin is warm.     Comments: Site is clean and dry Tender OP black fluid  Flushes easily per RN     Imaging: CT ABDOMEN W CONTRAST  Result Date: 08/23/2019 CLINICAL DATA:  Cholecystitis. Fever and leukocytosis. Elevated bilirubin. EXAM: CT ABDOMEN WITH CONTRAST TECHNIQUE: Multidetector CT imaging of the abdomen was performed using the standard protocol following bolus administration of intravenous contrast. CONTRAST:  25mL OMNIPAQUE IOHEXOL 300 MG/ML  SOLN COMPARISON:  07/30/2014 from [facility] FINDINGS: Lower chest: Small bilateral pleural effusions and dependent bibasilar atelectasis. Hepatobiliary: No hepatic masses identified. High attenuation sludge is seen in the gallbladder, however there are no findings of acute cholecystitis or biliary ductal dilatation. Pancreas:  No mass or inflammatory changes. Spleen:  Within normal limits in size and appearance. Adrenals/Urinary Tract: 1.4 cm left adrenal mass is seen which is nonspecific, but most likely represents a benign adenoma in patient without history of cancer. Moderate left renal atrophy is seen. No evidence of renal masses or hydronephrosis. Stomach/Bowel: A feeding tube is seen with tip in the proximal jejunum. No evidence of dilated  bowel loops. Mild diffuse mesenteric edema and ascites is seen, but there is no evidence of focal inflammatory process or abscess. Vascular/Lymphatic: No pathologically enlarged lymph nodes identified. No abdominal aortic aneurysm. Aortic atherosclerosis noted. Other:  None. Musculoskeletal:  No suspicious bone lesions identified. IMPRESSION: Gallbladder sludge. No radiographic evidence of acute cholecystitis or biliary ductal dilatation. Mild ascites  and diffuse mesenteric edema. No evidence of focal inflammatory process or abscess. Small bilateral pleural effusions and dependent bibasilar atelectasis. Small indeterminate left adrenal mass. If patient has no history of malignancy, this is probably a benign adenoma and follow-up by CT is recommended in 12 months. If patient does have cancer history, adrenal protocol abdomen CT without and with contrast is recommended now for further characterization. This recommendation follows ACR consensus guidelines: Management of Incidental Adrenal Masses: A White Paper of the ACR Incidental Findings Committee. J Am Coll Radiol 2017;14:1038-1044. Aortic Atherosclerosis (ICD10-I70.0). Electronically Signed   By: Danae Orleans M.D.   On: 08/23/2019 17:54   NM Hepatobiliary Liver Func  Result Date: 08/23/2019 CLINICAL DATA:  Cholecystitis scratch set evaluate cholecystitis. Elevated bilirubin. EXAM: NUCLEAR MEDICINE HEPATOBILIARY IMAGING TECHNIQUE: Sequential images of the abdomen were obtained out to 60 minutes following intravenous administration of radiopharmaceutical. RADIOPHARMACEUTICALS:  4.9 mCi Tc-29m  Choletec IV COMPARISON:  Abdominal sonogram from 08/16/2019 FINDINGS: Prompt uptake of activity by the liver is seen. Delayed biliary activity is noted beginning around 25 minutes post injection. Biliary activity passes into small bowel, consistent with patent common bile duct. After 1 hour no gallbladder activity is identified and there is persistent diffuse radiotracer activity throughout the liver. The patient subsequently received 3 mg of morphine, IV and continuous imaging was performed for an additional 30 minutes. No gallbladder activity identified following morphine administration. IMPRESSION: 1. No gallbladder activity identified after 90 minutes of imaging and following IV administration of 3 mg of morphine. Patency of the cystic duct cannot be confirmed. 2. There is delayed biliary activity occurring  approximately 25 minutes post injection of the radiopharmaceutical. In addition, there is persistence of radiotracer activity throughout the liver following 60 minutes and 90 minutes of imaging which suggest some degree of hepatic dysfunction. 3. Biliary to bowel activity is identified confirming patency of the common bile duct. Electronically Signed   By: Signa Kell M.D.   On: 08/23/2019 11:23   DG Chest Port 1 View  Result Date: 08/25/2019 CLINICAL DATA:  Status post CABG. EXAM: PORTABLE CHEST 1 VIEW COMPARISON:  08/20/1999 FINDINGS: Tracheostomy tube in satisfactory position. Feeding tube extending into the stomach. Stable bilateral chest tubes. No pneumothorax. Left PICC tip approximately 3 cm inferior to the superior cavoatrial junction. Mildly improved dense left lower lobe opacification. Small bilateral pleural effusions, improved on the right. Stable prominence of the interstitial markings. Stable post CABG changes. No acute bony abnormality. IMPRESSION: 1. Mildly improved dense left lower lobe atelectasis or pneumonia. 2. Small bilateral pleural effusions, improved on the right. 3. Stable probable combination of chronic interstitial lung disease and interstitial pulmonary edema. Electronically Signed   By: Beckie Salts M.D.   On: 08/25/2019 10:51   CT PERC CHOLECYSTOSTOMY  Result Date: 08/24/2019 INDICATION: Acute cholecystitis. Poor operative candidate. Please perform image guided placement cholecystostomy tube for infection source control purposes As the interventional radiology suite is currently being utilized for a complex endoleak procedure, we will proceed with placement of the cholecystostomy tube with ultrasound and CT guidance to facilitate drainage. EXAM: ULTRASOUND AND FLUOROSCOPIC-GUIDED CHOLECYSTOSTOMY TUBE PLACEMENT COMPARISON:  CT  abdomen pelvis-08/23/2019; nuclear medicine HIDA scan-08/23/2018 MEDICATIONS: The patient is currently admitted to the hospital and on intravenous  antibiotics. Antibiotics were administered within an appropriate time frame prior to skin puncture. ANESTHESIA/SEDATION: Moderate (conscious) sedation was employed during this procedure. A total of Versed 0.5 mg and Fentanyl 12.5 mcg was administered intravenously. Moderate Sedation Time: 20 minutes. The patient's level of consciousness and vital signs were monitored continuously by radiology nursing throughout the procedure under my direct supervision. CONTRAST:  None FLUOROSCOPY TIME:  Not provided COMPLICATIONS: None immediate. PROCEDURE: Informed written consent was obtained from the patient's family after a discussion of the risks, benefits and alternatives to treatment. Questions regarding the procedure were encouraged and answered. A timeout was performed prior to the initiation of the procedure. The right upper abdominal quadrant was prepped and draped in the usual sterile fashion, and a sterile drape was applied covering the operative field. Maximum barrier sterile technique with sterile gowns and gloves were used for the procedure. A timeout was performed prior to the initiation of the procedure. Local anesthesia was provided with 1% lidocaine with epinephrine. Ultrasound scanning of the right upper quadrant demonstrates a moderately dilated gallbladder with minimal amount of gallbladder wall thickening, however this is nonspecific finding in the setting known trace intra-abdominal ascites. Utilizing a transhepatic approach, an 18 gauge needle was advanced into the gallbladder under direct ultrasound guidance. An ultrasound image was saved for documentation purposes. Short Amplatz wire was coiled in gallbladder lumen. Appropriate positioning was confirmed with CT imaging. Next, the track was serially dilated allowing placement of a 10.2-French Cook cholecystomy tube into the gallbladder lumen where it was coiled and locked. Appropriate positioning was confirmed with CT imaging. Bile was aspirated and a  small amount of aspirated bile was capped and sent to the laboratory for analysis. The catheter was secured to the skin with an interrupted suture, connected to a drainage bag and a dressing was applied. The patient tolerated the procedure well without immediate post procedural complication. IMPRESSION: Successful ultrasound and CT guided placement of a 10.2 French cholecystostomy tube. A small amount of aspirated bile was capped and sent to the laboratory for analysis Electronically Signed   By: Simonne Come M.D.   On: 08/24/2019 14:18    Labs:  CBC: Recent Labs    08/23/19 1759 08/23/19 1759 08/23/19 1805 08/24/19 0929 08/24/19 2043 08/25/19 0302  WBC 20.5*  --   --  18.4* 18.7* 19.4*  HGB 8.8*   < > 9.9* 8.2* 8.6* 8.1*  HCT 29.4*   < > 29.0* 27.9* 29.8* 28.8*  PLT 419*  --   --  370 429* 392   < > = values in this interval not displayed.    COAGS: Recent Labs    08/06/19 0340 08/06/19 0340 08/07/19 0347 08/07/19 1442 08/08/19 0302 08/20/19 1202 08/21/19 0406 08/24/19 1833 08/24/19 2053 08/25/19 0302 08/25/19 0555  INR 1.2  --  1.3* 1.2  --  1.6*  --   --   --   --   --   APTT 94*   < > 44*  --    < >  --    < > 80* 78* 143* 80*   < > = values in this interval not displayed.    BMP: Recent Labs    08/24/19 1501 08/24/19 2053 08/25/19 0302 08/25/19 0801  NA 150* 151* 151* 152*  K 3.9 3.7 5.7* 3.8  CL 106 106 108 109  CO2 28 29 30  31  GLUCOSE 192* 219* 191* 174*  BUN 81* 80* 74* 77*  CALCIUM 8.6* 9.0 8.2* 8.5*  CREATININE 1.87* 1.72* 1.63* 1.71*  GFRNONAA 37* 41* 44* 41*  GFRAA 43* 47* 50* 48*    LIVER FUNCTION TESTS: Recent Labs    08/22/19 0349 08/22/19 1400 08/23/19 0312 08/23/19 0312 08/23/19 1537 08/24/19 0335 08/24/19 1459 08/25/19 0302  BILITOT 18.9*  --  14.9*  --   --  13.6*  --  12.1*  AST 554*  --  359*  --   --  239*  --  183*  ALT 317*  --  267*  --   --  197*  --  181*  ALKPHOS 860*  --  675*  --   --  554*  --  499*  PROT 6.0*   --  5.7*  --   --  6.3*  --  6.7  ALBUMIN 2.4*   < > 1.9*   < > 2.5* 2.9* 3.1* 2.7*   < > = values in this interval not displayed.    Assessment and Plan:  Percutaneous cholecystostomy drain placed 7/17 We will follow Will need in place 6-8 weeks-- unless to OR. IR can follow in OP Clinic Will need to know of DC  Electronically Signed: Robet Leu, PA-C 08/25/2019, 1:44 PM   I spent a total of 15 Minutes at the the patient's bedside AND on the patient's hospital floor or unit, greater than 50% of which was counseling/coordinating care for perc chole tube

## 2019-08-25 NOTE — Progress Notes (Signed)
Patient ID: Todd [name], male   DOB: January 12, 1956, 64 y.o.   MRN: [id number]         [facility]  Date of Admission:  07/26/2019   Total days of antibiotics 24        Day 4 piperacillin tazobactam        Day 2 fluconazole  ASSESSMENT: Recent blood cultures are growing Candida parapsilosis and 1 set.  Interestingly he was still on anidulafungin when these blood cultures were obtained.  He also has Candida parapsilosis and pleural fluid and BAL cultures.  He had a cholecystostomy tube placed yesterday.  No organisms were seen on mild Gram stain and cultures are negative so far.  He seems to be improving.  She is more alert active and his temperature is coming down.  Ideally it would be best to do with his PICC.he is currently still on high-dose pressors with no other good access.  We will continue current therapy but consider stopping piperacillin tazobactam soon depending on culture results.  PLAN: 1. Continue piperacillin tazobactam and fluconazole 2. Repeat blood cultures  Principal Problem:   Acute systolic heart failure (HCC) Active Problems:   Candidemia (HCC)   CHF (congestive heart failure) (HCC)   Chronic pain syndrome   Mixed hyperlipidemia   Moderate recurrent major depression (HCC)   Hypothyroidism   Type 2 diabetes mellitus with hyperglycemia (HCC)   AKI (acute kidney injury) ([place])   Coronary artery disease involving native coronary artery of native heart with unstable angina pectoris (HCC)   Coronary artery disease   Malnutrition of moderate degree   Pressure injury of skin   Acute on chronic respiratory failure (HCC)   Acute respiratory failure (HCC)   S/P CABG (coronary artery bypass graft)   Scheduled Meds:  artificial tears   Both Eyes Q8H   aspirin  81 mg Per Tube Daily   chlorhexidine gluconate (MEDLINE KIT)  15 mL Mouth Rinse BID   Chlorhexidine Gluconate Cloth  6 each Topical Daily   collagenase   Topical Daily   docusate   200 mg Per Tube Daily   feeding supplement (PROSource TF)  45 mL Per Tube BID   free water  400 mL Per Tube Q4H   insulin aspart  0-24 Units Subcutaneous Q4H   insulin aspart  5 Units Subcutaneous Q4H   insulin detemir  5 Units Subcutaneous BID   ipratropium  0.5 mg Nebulization Q6H   levalbuterol  1.25 mg Nebulization Q6H   mouth rinse  15 mL Mouth Rinse 10 times per day   pantoprazole (PROTONIX) IV  40 mg Intravenous QHS   potassium chloride  40 mEq Per Tube BID   sodium chloride flush  10-40 mL Intracatheter Q12H   sodium chloride flush  10-40 mL Intracatheter Q12H   sodium chloride flush  3 mL Intravenous Q12H   sodium chloride flush  5 mL Intracatheter Q8H   Continuous Infusions:  sodium chloride 250 mL (08/13/19 1412)   sodium chloride Stopped (08/13/19 1631)   amiodarone 30 mg/hr (08/25/19 1300)   bivalirudin (ANGIOMAX) infusion 0.5 mg/mL (Non-ACS indications) 0.055 mg/kg/hr (08/25/19 1300)   feeding supplement (PIVOT 1.5 CAL) 60 mL/hr at 08/25/19 1300   fluconazole (DIFLUCAN) IV Stopped (08/25/19 0525)   lactated ringers 20 mL/hr at 08/10/19 1705   lactated ringers 10 mL/hr at 08/18/19 0054   milrinone 0.125 mcg/kg/min (08/25/19 1300)   norepinephrine (LEVOPHED) Adult infusion 30 mcg/min (08/25/19 1300)   piperacillin-tazobactam (ZOSYN)  IV 3.375 g (08/25/19 1311)   vasopressin Stopped (08/25/19 0923)   PRN Meds:.sodium chloride, sodium chloride, dextrose, fentaNYL (SUBLIMAZE) injection, metoprolol tartrate, midazolam, ondansetron (ZOFRAN) IV, sodium chloride flush, sodium chloride flush, sodium chloride flush   Review of Systems: Review of Systems  Unable to perform ROS: Intubated    Allergies  Allergen Reactions   Empagliflozin     Other reaction(s): diarrhea   Heparin Other (See Comments)    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  Physical Exam Constitutional:      Comments: He is symptomatic, follows commands and nods appropriately.  HENT:     Mouth/Throat:     Comments: He has poor dentition. Eyes:     Comments: He is jaundiced.  Cardiovascular:     Rate and Rhythm: Normal rate and regular rhythm.     Heart sounds: No murmur heard.   Pulmonary:     Breath sounds: Normal breath sounds.     Comments: He has a tracheostomy tube and bilateral chest tubes. Chest:    Abdominal:     General: There is no distension.     Palpations: Abdomen is soft.     Comments: Rectal tube in place.  Skin:    Comments: Left upper arm PICC in place.     Lab Results Lab Results  Component Value Date   WBC 19.4 (H) 08/25/2019   HGB 8.1 (L) 08/25/2019   HCT 28.8 (L) 08/25/2019   MCV 98.3 08/25/2019   PLT 392 08/25/2019    Lab Results  Component Value Date   CREATININE 1.71 (H) 08/25/2019   BUN 77 (H) 08/25/2019   NA 152 (H) 08/25/2019   K 3.8 08/25/2019   CL 109 08/25/2019   CO2 31 08/25/2019    Lab Results  Component Value Date   ALT 181 (H) 08/25/2019   AST 183 (H) 08/25/2019   ALKPHOS 499 (H) 08/25/2019   BILITOT 12.1 (H) 08/25/2019     Microbiology: Recent Results (from the past 240 hour(s))  Culture, Urine     Status: None   Collection Time: 08/15/19  6:13 PM   Specimen: Urine, Catheterized  Result Value Ref Range Status   Specimen Description URINE, CATHETERIZED  Final   Special Requests NONE  Final   Culture   Final    NO GROWTH Performed at [facility], [address], [place], [place] [address]    Report Status  08/17/2019 FINAL  Final  Culture, blood (routine x 2)     Status: None   Collection Time: 08/15/19  7:53 PM   Specimen: BLOOD RIGHT HAND  Result Value Ref Range Status   Specimen Description BLOOD RIGHT HAND  Final   Special Requests   Final    BOTTLES DRAWN AEROBIC AND ANAEROBIC Blood Culture adequate volume  Culture   Final    NO GROWTH 5 DAYS Performed at [facility], [place] [address], [place], [place] [address]    Report Status 08/20/2019 FINAL  Final  Culture, blood (routine x 2)     Status: None   Collection Time: 08/15/19  7:53 PM   Specimen: BLOOD RIGHT HAND  Result Value Ref Range Status   Specimen Description BLOOD RIGHT HAND  Final   Special Requests   Final    BOTTLES DRAWN AEROBIC AND ANAEROBIC Blood Culture adequate volume   Culture   Final    NO GROWTH 5 DAYS Performed at [facility], [place] [address], [place], [place] [address]    Report Status 08/20/2019 FINAL  Final  Aerobic/Anaerobic Culture (surgical/deep wound)     Status: None   Collection Time: 08/17/19  2:32 PM   Specimen: Wound  Result Value Ref Range Status   Specimen Description WOUND  Final   Special Requests NONE  Final   Gram Stain NO WBC SEEN NO ORGANISMS SEEN   Final   Culture   Final    No growth aerobically or anaerobically. Performed at [facility], [place] [address] [place], [place] [address]    Report Status 08/22/2019 FINAL  Final  Culture, fungus without smear     Status: Abnormal (Preliminary result)   Collection Time: 08/20/19  2:00 PM   Specimen: Bronchoalveolar Lavage; Other  Result Value Ref Range Status   Specimen Description BRONCHIAL ALVEOLAR LAVAGE  Final   Special Requests   Final    Normal Performed at [facility], [place] [address], [place], [place] [address]    Culture CANDIDA ALBICANS (A)  Final   Report Status PENDING  Incomplete  Culture, bal-quantitative     Status: Abnormal   Collection Time: 08/20/19  2:00 PM   Specimen: Bronchoalveolar  Lavage; Respiratory  Result Value Ref Range Status   Specimen Description BRONCHIAL ALVEOLAR LAVAGE  Final   Special Requests NONE  Final   Gram Stain   Final    FEW WBC PRESENT, PREDOMINANTLY PMN NO ORGANISMS SEEN Performed at [facility], [address], [place], [place] [address]    Culture (A)  Final    4,000 COLONIES/mL CANDIDA TROPICALIS 3,000 COLONIES/mL CANDIDA ALBICANS    Report Status 08/25/2019 FINAL  Final  Body fluid culture     Status: None   Collection Time: 08/22/19  9:14 AM   Specimen: Pleura; Body Fluid  Result Value Ref Range Status   Specimen Description PLEURAL  Final   Special Requests NONE  Final   Gram Stain   Final    NO WBC SEEN NO ORGANISMS SEEN Performed at [facility], [address] [place]  [address]    Culture RARE CANDIDA PARAPSILOSIS  Final   Report Status 08/25/2019 FINAL  Final  Culture, blood (routine x 2)     Status: Abnormal (Preliminary result)   Collection Time: 08/22/19 12:58 PM   Specimen: BLOOD RIGHT HAND  Result Value Ref Range Status   Specimen Description BLOOD RIGHT HAND  Final   Special Requests   Final    BOTTLES DRAWN AEROBIC ONLY Blood Culture adequate volume   Culture  Setup Time   Final    AEROBIC BOTTLE ONLY YEAST CRITICAL RESULT CALLED TO, READ BACK BY AND VERIFIED WITH: J [facility] 08/25/19 0123 JDW    Culture (A)  Final    CANDIDA PARAPSILOSIS  CULTURE REINCUBATED FOR BETTER GROWTH Performed at [facility], [place] [address], [place], [place] [address]    Report Status PENDING  Incomplete  Culture, blood (routine x 2)     Status: None (Preliminary result)   Collection Time: 08/22/19 12:58 PM   Specimen: BLOOD RIGHT HAND  Result Value Ref Range Status   Specimen Description BLOOD RIGHT HAND  Final   Special Requests   Final    BOTTLES DRAWN AEROBIC ONLY Blood Culture adequate volume   Culture   Final    NO GROWTH 3 DAYS Performed at [facility], [place] [address],  [place], [place] [address]    Report Status PENDING  Incomplete  Blood Culture ID Panel (Reflexed)     Status: Abnormal   Collection Time: 08/22/19 12:58 PM  Result Value Ref Range Status   Enterococcus species NOT DETECTED NOT DETECTED Final   Listeria monocytogenes NOT DETECTED NOT DETECTED Final   Staphylococcus species NOT DETECTED NOT DETECTED Final   Staphylococcus aureus (BCID) NOT DETECTED NOT DETECTED Final   Streptococcus species NOT DETECTED NOT DETECTED Final   Streptococcus agalactiae NOT DETECTED NOT DETECTED Final   Streptococcus pneumoniae NOT DETECTED NOT DETECTED Final   Streptococcus pyogenes NOT DETECTED NOT DETECTED Final   Acinetobacter baumannii NOT DETECTED NOT DETECTED Final   Enterobacteriaceae species NOT DETECTED NOT DETECTED Final   Enterobacter cloacae complex NOT DETECTED NOT DETECTED Final   Escherichia coli NOT DETECTED NOT DETECTED Final   Klebsiella oxytoca NOT DETECTED NOT DETECTED Final   Klebsiella pneumoniae NOT DETECTED NOT DETECTED Final   Proteus species NOT DETECTED NOT DETECTED Final   Serratia marcescens NOT DETECTED NOT DETECTED Final   Haemophilus influenzae NOT DETECTED NOT DETECTED Final   Neisseria meningitidis NOT DETECTED NOT DETECTED Final   Pseudomonas aeruginosa NOT DETECTED NOT DETECTED Final   Candida albicans NOT DETECTED NOT DETECTED Final   Candida glabrata NOT DETECTED NOT DETECTED Final   Candida krusei NOT DETECTED NOT DETECTED Final   Candida parapsilosis DETECTED (A) NOT DETECTED Final    Comment: CRITICAL RESULT CALLED TO, READ BACK BY AND VERIFIED WITH: J [facility] 08/25/19 0123 JDW    Candida tropicalis NOT DETECTED NOT DETECTED Final    Comment: Performed at [facility], [place] [address], [place], [place] [address]  Aerobic/Anaerobic Culture (surgical/deep wound)     Status: None (Preliminary result)   Collection Time: 08/24/19  1:35 PM   Specimen: Abscess  Result Value Ref Range Status   Specimen  Description ABSCESS  Final   Special Requests DRAIN  Final   Gram Stain   Final    RARE WBC PRESENT, PREDOMINANTLY PMN NO ORGANISMS SEEN    Culture   Final    NO GROWTH < 24 HOURS Performed at [facility], [place] [address] [place], [place] [address]    Report Status PENDING  Incomplete    Michel Bickers, MD [facility] [place] [phone] pager   [phone] cell 08/25/2019, 2:42 PM

## 2019-08-26 DIAGNOSIS — J9621 Acute and chronic respiratory failure with hypoxia: Secondary | ICD-10-CM

## 2019-08-26 LAB — BASIC METABOLIC PANEL
Anion gap: 10 (ref 5–15)
BUN: 65 mg/dL — ABNORMAL HIGH (ref 8–23)
CO2: 28 mmol/L (ref 22–32)
Calcium: 8.3 mg/dL — ABNORMAL LOW (ref 8.9–10.3)
Chloride: 109 mmol/L (ref 98–111)
Creatinine, Ser: 1.42 mg/dL — ABNORMAL HIGH (ref 0.61–1.24)
GFR calc Af Amer: 60 mL/min — ABNORMAL LOW (ref >60)
GFR calc non Af Amer: 51 mL/min — ABNORMAL LOW (ref >60)
Glucose, Bld: 234 mg/dL — ABNORMAL HIGH (ref 70–99)
Potassium: 4.6 mmol/L (ref 3.5–5.1)
Sodium: 147 mmol/L — ABNORMAL HIGH (ref 135–145)

## 2019-08-26 LAB — RENAL FUNCTION PANEL
Albumin: 2.2 g/dL — ABNORMAL LOW (ref 3.5–5.0)
Anion gap: 9 (ref 5–15)
BUN: 51 mg/dL — ABNORMAL HIGH (ref 8–23)
CO2: 26 mmol/L (ref 22–32)
Calcium: 8.3 mg/dL — ABNORMAL LOW (ref 8.9–10.3)
Chloride: 111 mmol/L (ref 98–111)
Creatinine, Ser: 1.18 mg/dL (ref 0.61–1.24)
GFR calc Af Amer: >60 mL/min (ref >60)
GFR calc non Af Amer: >60 mL/min (ref >60)
Glucose, Bld: 185 mg/dL — ABNORMAL HIGH (ref 70–99)
Phosphorus: 3.7 mg/dL (ref 2.5–4.6)
Potassium: 3.9 mmol/L (ref 3.5–5.1)
Sodium: 146 mmol/L — ABNORMAL HIGH (ref 135–145)

## 2019-08-26 LAB — COMPREHENSIVE METABOLIC PANEL
ALT: 163 U/L — ABNORMAL HIGH (ref 0–44)
AST: 166 U/L — ABNORMAL HIGH (ref 15–41)
Albumin: 2.3 g/dL — ABNORMAL LOW (ref 3.5–5.0)
Alkaline Phosphatase: 576 U/L — ABNORMAL HIGH (ref 38–126)
Anion gap: 10 (ref 5–15)
BUN: 65 mg/dL — ABNORMAL HIGH (ref 8–23)
CO2: 29 mmol/L (ref 22–32)
Calcium: 8.2 mg/dL — ABNORMAL LOW (ref 8.9–10.3)
Chloride: 109 mmol/L (ref 98–111)
Creatinine, Ser: 1.45 mg/dL — ABNORMAL HIGH (ref 0.61–1.24)
GFR calc Af Amer: 58 mL/min — ABNORMAL LOW (ref >60)
GFR calc non Af Amer: 50 mL/min — ABNORMAL LOW (ref >60)
Glucose, Bld: 170 mg/dL — ABNORMAL HIGH (ref 70–99)
Potassium: 3.8 mmol/L (ref 3.5–5.1)
Sodium: 148 mmol/L — ABNORMAL HIGH (ref 135–145)
Total Bilirubin: 12 mg/dL — ABNORMAL HIGH (ref 0.3–1.2)
Total Protein: 6.1 g/dL — ABNORMAL LOW (ref 6.5–8.1)

## 2019-08-26 LAB — MAGNESIUM
Magnesium: 2.4 mg/dL (ref 1.7–2.4)
Magnesium: 2.4 mg/dL (ref 1.7–2.4)

## 2019-08-26 LAB — CULTURE, BLOOD (ROUTINE X 2): Special Requests: ADEQUATE

## 2019-08-26 LAB — APTT: aPTT: 74 seconds — ABNORMAL HIGH (ref 24–36)

## 2019-08-26 LAB — LACTATE DEHYDROGENASE: LDH: 314 U/L — ABNORMAL HIGH (ref 98–192)

## 2019-08-26 LAB — GLUCOSE, CAPILLARY
Glucose-Capillary: 101 mg/dL — ABNORMAL HIGH (ref 70–99)
Glucose-Capillary: 150 mg/dL — ABNORMAL HIGH (ref 70–99)
Glucose-Capillary: 158 mg/dL — ABNORMAL HIGH (ref 70–99)
Glucose-Capillary: 167 mg/dL — ABNORMAL HIGH (ref 70–99)
Glucose-Capillary: 222 mg/dL — ABNORMAL HIGH (ref 70–99)
Glucose-Capillary: 86 mg/dL (ref 70–99)

## 2019-08-26 LAB — CBC
HCT: 29.3 % — ABNORMAL LOW (ref 39.0–52.0)
Hemoglobin: 8.7 g/dL — ABNORMAL LOW (ref 13.0–17.0)
MCH: 28.8 pg (ref 26.0–34.0)
MCHC: 29.7 g/dL — ABNORMAL LOW (ref 30.0–36.0)
MCV: 97 fL (ref 80.0–100.0)
Platelets: 406 10*3/uL — ABNORMAL HIGH (ref 150–400)
RBC: 3.02 MIL/uL — ABNORMAL LOW (ref 4.22–5.81)
RDW: 25.2 % — ABNORMAL HIGH (ref 11.5–15.5)
WBC: 24.7 10*3/uL — ABNORMAL HIGH (ref 4.0–10.5)
nRBC: 0.6 % — ABNORMAL HIGH (ref 0.0–0.2)

## 2019-08-26 LAB — COOXEMETRY PANEL
Carboxyhemoglobin: 1.9 % — ABNORMAL HIGH (ref 0.5–1.5)
Methemoglobin: 1.2 % (ref 0.0–1.5)
O2 Saturation: 64.4 %
Total hemoglobin: 8.8 g/dL — ABNORMAL LOW (ref 12.0–16.0)

## 2019-08-26 LAB — TRIGLYCERIDES: Triglycerides: 236 mg/dL — ABNORMAL HIGH (ref <150)

## 2019-08-26 MED ORDER — LEVALBUTEROL HCL 1.25 MG/0.5ML IN NEBU
1.2500 mg | INHALATION_SOLUTION | Freq: Three times a day (TID) | RESPIRATORY_TRACT | Status: DC
  Administered 2019-08-27 – 2019-09-10 (×43): 1.25 mg via RESPIRATORY_TRACT
  Filled 2019-08-26 (×43): qty 0.5

## 2019-08-26 MED ORDER — MIDODRINE HCL 5 MG PO TABS
5.0000 mg | ORAL_TABLET | Freq: Three times a day (TID) | ORAL | Status: DC
  Administered 2019-08-26 – 2019-08-27 (×4): 5 mg
  Filled 2019-08-26 (×4): qty 1

## 2019-08-26 MED ORDER — MIDODRINE HCL 5 MG PO TABS: 5.0000 mg | ORAL_TABLET | Freq: Three times a day (TID) | ORAL | Status: DC

## 2019-08-26 MED ORDER — AMIODARONE HCL 200 MG PO TABS
200.0000 mg | ORAL_TABLET | Freq: Two times a day (BID) | ORAL | Status: DC
  Administered 2019-08-26 – 2019-09-04 (×18): 200 mg
  Filled 2019-08-26 (×19): qty 1

## 2019-08-26 MED ORDER — IPRATROPIUM BROMIDE 0.02 % IN SOLN
0.5000 mg | Freq: Three times a day (TID) | RESPIRATORY_TRACT | Status: DC
  Administered 2019-08-27 – 2019-09-10 (×43): 0.5 mg via RESPIRATORY_TRACT
  Filled 2019-08-26 (×43): qty 2.5

## 2019-08-26 MED ORDER — POTASSIUM CHLORIDE 20 MEQ/15ML (10%) PO SOLN
40.0000 meq | Freq: Once | ORAL | Status: AC
  Administered 2019-08-26: 40 meq via ORAL
  Filled 2019-08-26: qty 30

## 2019-08-26 NOTE — Progress Notes (Signed)
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    Assessment: 64 yo male s/p ECMO and Impella. Chest closed 7/2. CT drainage decreased.    HIT ab sent a few days ago and returned as 2.6 - positive, switched from heparin to bivalirudin. SRA positive, confirmed for HIT.  APTT (74) remains therapeutic on bivalirudin 0.055 mg/kg/hr. Hgb stable 8.7, platelets stable 406. Patient has some bruising on arm, but no overt bleeding or infusion issues per RN.   Goals of Therapy: APTT 50-85 secs  Plan: Continue bivalirudin at 0.055 mg/kg/hr Monitor daily aPTT, CBC, and for s/sx of bleeding  Tama Headings, PharmD PGY2 Cardiology Pharmacy Resident Phone: [phone] 08/26/2019  7:55 AM  Please check [email or web address] for unit-specific pharmacy phone numbers.

## 2019-08-26 NOTE — Progress Notes (Signed)
NAME:  Todd [name], MRN:  [id number], DOB:  [birth year], LOS: 81 ADMISSION DATE:  07/26/2019, CONSULTATION DATE:  08/03/19 REFERRING MD:  Roxan Hockey, CHIEF COMPLAINT:  ECMO   Brief History   VA ECMO for post CABG Vtach arrest  History of present illness   Presented with worsening dyspnea 6/17 c/w CHF exacerbation Cath with severe triple vessel disease Underwent CABG 6/24 Vtach arrest 6/25 Chest opened bedside and cardiac massage initiated as well as multiple cardioversions, amiodarone, bicarb etc Brought to OR and cannulated for VA ECMO PCCM consulted to assist with management Comorbidities include DM, heavy smoking, COPD  Past Medical History  Depression Ischemic cardiomyopathy HTN [facility] Events   6/24 CABG 6/25 VA cannulation 6/28 decannulated and impella placed 6/29 bedside re-exploration; s/p decannulation. Placement of impella device originally at p8 but decreased to p4 2/2 suction events overnight up to p6. Echo completed and repositioned device. Remained on considerable support with 8 epi and 46 norepi vaso 0.05. continued chest tube output with large clots noted. Heparin thru device. Replacement products ongoing. 60% 8 peep 6/30: bedside mediastinal re-exploration and clean out of hematoma with tamponade and worsening hemodynamics. Pt had large volume transfusion. rebolused with amio 2/2 nsvt episode.  Weight up 48 pounds.  Started diuresis, Lasix drip started. 7/1 iatrogenic respiratory alkalosis, vent rate decreased. 7/2: No significant issues overnight remains on pressors and Impella not tolerating tube feeds with high gastric output awaiting core track placement 7/3: started trickle TF  7/5: Swab removed, CVL and PICC placed. 7/13 Trach and BAL  7/16 - No events. Awake this AM and tracking. Still won't follow commands for me. Still having fevers. Successful ultrasound and CT guided placement of a 10.2 French cholecystostomy tube. A small amount of  aspirated bile was capped and sent to the laboratory for analysis  7/17 - - off sedation gtt. doing pressure support ventilation.  Beginning to follow some commands occasionally intermittently.  Very jaundiced.  Growing Candida in the blood and BAL.  Still febrile but T-max is coming down.  White count persistently elevated; currently at 19.4 okay.  Diflucan started. Currently on milrinone GTT, amiodarone gtt., vasopressin gtt., Levophed gtt. and bivalrudin gtt. Off epi gtt. Cards planning lasix x 1 today.    Consults:  Advanced heart failure, PCCM, TCTS  Procedures:  See below  Significant Diagnostic Tests:  6/22 spirometry with restrictive physiology, preserved FEV1/FVC CXR today with bilateral airspace disease, cannulas in good position, deep left sulcus that was present yesterday, no clear PTX 6/18 echo: LVEF 63-84%, grade I diastolic dysfunction 7/7: US Abdomen cholelithiasis without cholecystitis. Mild wall thinking in setting of liver disease and ascites.   Micro Data:  COVID and HIV Neg 7/3 respiratory-few Candida tropicalis 7/5 blood>> 7/5 respiratory-few Candida tropicalis 7/7 urine-NG 7/7 blood>>NG 7/9 wound>> NG 7/12 BAL> Candida tropicalis detected 08/22/2019: Blood BC ID Candida parapsilosis detected  Antimicrobials:  Vanc6/24>>7/13 Cefuroxime 6/24>> 6/25 merrem 6/26->7/14 Anidulafungin 7/8->7/14 Zosyn 7/14>> xxxxxxxx Diflucan 08/25/2019 >>  Interim history/subjective:   7/18 -bilirubin is improved to 12 mg percent.  Fever curve is improved.  Nearly afebrile x24 hours.  QTC prolonged at 555 ms on 12-lead EKG [normal 10 days earlier] -on IV amiodarone and Diflucan..  Creatinine stable at 1.59m%.  Currently on pressure support ventilation and seems to be fatigued.  He is awake and nodding his head but unable to move his extremity.  Diffusely weak.  Objective   Blood pressure (!) 121/49, pulse 78, temperature 98.2 F (  36.8 C), temperature source Oral, resp. rate  18, height _0  (1.753 m), weight 67.9 kg, SpO2 100 %. CVP:  [6 mmHg-15 mmHg] 6 mmHg  Vent Mode: PSV;CPAP FiO2 (%):  [40 %] 40 % Set Rate:  [20 bmp] 20 bmp Vt Set:  [420 mL] 420 mL PEEP:  [5 cmH20] 5 cmH20 Pressure Support:  [5 cmH20] 5 cmH20   Intake/Output Summary (Last 24 hours) at 08/26/2019 0805 Last data filed at 08/26/2019 0700 Gross per 24 hour  Intake 1981.92 ml  Output 3995 ml  Net -2013.08 ml   Filed Weights   08/24/19 0500 08/25/19 0500 08/26/19 0450  Weight: 68.2 kg 68.4 kg 67.9 kg  General Appearance:  Looks criticall ill and deconditioned Head:  Normocephalic, without obvious abnormality, atraumatic Eyes:  PERRL -yes, conjunctiva/corneas -jaundiced     Ears:  Normal external ear canals, both ears Nose:  G tube -yes Throat:  ETT TUBE -no, OG tube -no.  Tracheostomy present plus Neck:  Supple,  No enlargement/tenderness/nodules Lungs: Clear to auscultation bilaterally, Ventilator   Synchrony -yes but paradoxical on pressure support.  Sternotomy stable plus. Heart:  S1 and S2 normal, no murmur, CVP -no.  Pressors -no Abdomen:  Soft, no masses, no organomegaly Genitalia / Rectal:  Not done Extremities:  Extremities-intact Skin:  ntact in exposed areas . Sacral area -no decub Neurologic:  Sedation - no sedation infusion -> RASS - 0 . Moves all 4s - no - diffusely weak. CAM-ICU - cannot asses . Orientation - shakes head but diffusely weak      Resolved Hospital Problem list   N/A  Assessment & Plan:   Cardiogenic/hemorrhagic shock status post CABG complicated by VT arrest, EF 20 to 25% with severe LV dysfunction and moderate RV dysfunction.  S/p VA ECMO (decanulated 6/28). 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 Off vasopressin. QTc prolonged  - per Dr Aundra Dubin  -0 he needs his amio due to VT episodes  Plan -Continue  milrinone infusion, Levophed  Infusions - chagne amio IV gtt to amio tab per Dr Aundra Dubin -Continue aspirin  -Appears to be off statin secondary to?  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 -Medications being managed by cardiology and primary services  Acute hypoxic respiratory failure w/ need for mechanical ventilation; -prolonged mechanical ventilation status post chronic respiratory failure tracheostomy August 20, 2019  7/18 - seems fatigued with PSV. Has critical illness polyneuro-myopathy  Plan -Pressure support as tolerated daily  - PRVC otherwise (currently rest back) - VAP prevention bundle  Shock liver Hyperbilirubinemia Question acalculous cholecystitis Cirrhosis per 7/8 [place] -status post on August 24, 2019  percutaneous GB drain given ongoing fevers and findings on HIDA scan  -08/26/2019: Improved bilirubin and liver enzymes and INR  Plan  - monitor  -Avoid hepatotoxic agents - ? Restart statin when better  T2DM -SSI for now, restart levemir once TF restarted   Fever curve- cultures negative to date. LE duplex neb. Switched to zosyn 7/14 by ID  08/25/2019: Growing Candida of 2 different subtypes in the BAL and blood from August 20, 2019 7/18 - fever improved  Plan  - continue Diflucan -requires QTC monitoring.  - recheck EKG 7/19 -Continue Zosyn -Infectious disease consult appreciated   Acute blood loss anemia and  critical illness anemia Thrombocytopenia secondary to heparin-induced thrombocytopenia  08/25/2019: Hemoglobin greater than 8.  Platelets normal on bivalaruding  plan -Continue bivalirudin -Continue to monitor CBCs - - PRBC for hgb </= 6.9gm%    - exceptions are   -  if ACS susepcted/confirmed then transfuse for hgb </= 8.0gm%,  or    -  active bleeding with hemodynamic instability, then transfuse regardless of hemoglobin  value   At at all times try to transfuse 1 unit prbc as possible with exception of active hemorrhage  Acute kidney injury  08/25/2019: Improving  Plan  -Maintain hemodynamics and avoid nephrotoxic agents -Correct electrolyte imbalance as needed  Electrolyte imbalance  08/25/2019: Hypernatremia improving/stable/mild to moderate.  Effect of Lasix  Plan  -Correct with free water if in the severe category   Acute encephalopathy due to sedation- sedation off, EEG/CT head/ammonia do not explain Criticial illness neuro-myopathy   08/26/2019:- OImproved mental status. Awake. Nods. But diffusely weak  plan - monitor off sedtion    Small left adrenal mass seen on CT scan of the abdomen August 24, 2019  Plan -Repeat imaging in July 2022   Best practice:  Diet: Tube feeds on hold for perc drain Pain/Anxiety/Delirium protocol (if indicated): PRN only VAP protocol (if indicated): in place DVT prophylaxis: bival GI prophylaxis: PPI Glucose control: see above Mobility: BR Code Status: Full Family Communication: per primary Disposition:  ICU   ATTESTATION & SIGNATURE   The patient Daxtyn Rottenberg is critically ill with multiple organ systems failure and requires high complexity decision making for assessment and support, frequent evaluation and titration of therapies, application of advanced monitoring technologies and extensive interpretation of multiple databases.   Critical Care Time devoted to patient care services described in this note is  31  Minutes. This time reflects time of care of this signee Dr Brand Males. This critical care time does not reflect procedure time, or teaching time or supervisory time of PA/NP/Med student/Med Resident etc but could involve care discussion time     Dr. Brand Males, M.D., [facility].C.P Pulmonary and Critical Care Medicine Staff Physician [facility] Pager: [phone], If no answer or between   15:00h - 7:00h: call [phone]  08/26/2019 8:24 AM      LABS    PULMONARY Recent Labs  Lab 08/23/19 0312 08/23/19 1805 08/24/19 0335 08/25/19 0302 08/26/19 0228  PHART  --  7.519*  --   --   --   PCO2ART  --  52.0*  --   --   --   PO2ART  --  108  --   --   --   HCO3  --  41.8*  --   --   --   TCO2  --  43*  --   --   --   O2SAT 55.5 98.0 63.8 60.7 64.4    CBC Recent Labs  Lab 08/24/19 2043 08/25/19 0302 08/26/19 0228  HGB 8.6* 8.1* 8.7*  HCT 29.8* 28.8* 29.3*  WBC 18.7* 19.4* 24.7*  PLT 429* 392 406*    COAGULATION Recent Labs  Lab 08/20/19 1202  INR 1.6*    CARDIAC  No results for input(s): TROPONINI in the last 168 hours. No results for input(s): PROBNP in the last 168 hours.   CHEMISTRY Recent Labs  Lab 08/23/19 8882 08/23/19 8003 08/23/19 1537 08/23/19 1805 08/24/19 1459 08/24/19 1501 08/24/19 2053 08/24/19 2053 08/25/19 0302 08/25/19 0302 08/25/19 0801  08/25/19 0801 08/25/19 1419 08/25/19 1419 08/25/19 1538 08/25/19 1538 08/25/19 1553 08/25/19 1553 08/26/19 0032 08/26/19 0228  NA 152*   < > 150*   < > 150*   < > 151*   < > 151*   < > 152*   < > 148*  --  150*  --  151*  --  147* 148*  K 3.1*   < > 4.2   < > 3.9   < > 3.7   < > 5.7*   < > 3.8   < > 3.5   < > 3.8   < > 4.2   < > 4.6 3.8  CL 99   < > 98   < > 106   < > 106   < > 108   < > 109   < > 108  --  109  --  110  --  109 109  CO2 41*   < > 35*   < > 28   < > 29   < > 30   < > 31   < > 28  --  29  --  24  --  28 29  GLUCOSE 104*   < > 180*   < > 193*   < > 219*   < > 191*   < > 174*   < > 194*  --  204*  --  196*  --  234* 170*  BUN 76*   < > 82*   < > 80*   < > 80*   < > 74*   < > 77*   < > 76*  --  72*  --  76*  --  65* 65*  CREATININE 1.40*   < > 1.62*   < > 1.87*   < > 1.72*   < > 1.63*   < > 1.71*   < > 1.54*  --  1.57*  --  1.57*  --  1.42* 1.45*  CALCIUM 7.9*   < > 8.5*   < > 8.6*   < > 9.0   < > 8.2*   < > 8.5*   < > 8.4*  --  8.2*  --  8.5*  --  8.3* 8.2*  MG  2.5*  --   --    < >  --   --  2.5*  --  2.4  --  2.5*  --   --   --   --   --   --   --  2.4 2.4  PHOS 5.1*  --  6.9*  --  6.6*  --   --   --   --   --   --   --   --   --  4.0  --  4.4  --   --   --    < > = values in this interval not displayed.   Estimated Creatinine Clearance: 48.8 mL/min (A) (by C-G formula based on SCr of 1.45 mg/dL (H)).   LIVER Recent Labs  Lab 08/20/19 1202 08/20/19 1507 08/22/19 0349 08/22/19 1400 08/23/19 0312 08/23/19 1537 08/24/19 0335 08/24/19 0335 08/24/19 1459 08/25/19 0302 08/25/19 1538 08/25/19 1553 08/26/19 0228  AST  --    < > 554*  --  359*  --  239*  --   --  183*  --   --  166*  ALT  --    < > 317*  --  267*  --  197*  --   --  181*  --   --  163*  ALKPHOS  --    < > 860*  --  675*  --  554*  --   --  499*  --   --  576*  BILITOT  --    < > 18.9*  --  14.9*  --  13.6*  --   --  12.1*  --   --  12.0*  PROT  --    < > 6.0*  --  5.7*  --  6.3*  --   --  6.7  --   --  6.1*  ALBUMIN  --    < > 2.4*   < > 1.9*   < > 2.9*   < > 3.1* 2.7* 2.4* 2.6* 2.3*  INR 1.6*  --   --   --   --   --   --   --   --   --   --   --   --    < > = values in this interval not displayed.     INFECTIOUS Recent Labs  Lab 08/20/19 1202  PROCALCITON 2.01     ENDOCRINE CBG (last 3)  Recent Labs    08/25/19 2352 08/26/19 0346 08/26/19 0717  GLUCAP 180* 150* 158*         IMAGING x48h  - image(s) personally visualized  -   highlighted in bold DG Chest Port 1 View  Result Date: 08/25/2019 CLINICAL DATA:  Status post CABG. EXAM: PORTABLE CHEST 1 VIEW COMPARISON:  08/20/1999 FINDINGS: Tracheostomy tube in satisfactory position. Feeding tube extending into the stomach. Stable bilateral chest tubes. No pneumothorax. Left PICC tip approximately 3 cm inferior to the superior cavoatrial junction. Mildly improved dense left lower lobe opacification. Small bilateral pleural effusions, improved on the right. Stable prominence of the interstitial markings. Stable  post CABG changes. No acute bony abnormality. IMPRESSION: 1. Mildly improved dense left lower lobe atelectasis or pneumonia. 2. Small bilateral pleural effusions, improved on the right. 3. Stable probable combination of chronic interstitial lung disease and interstitial pulmonary edema. Electronically Signed   By: Claudie Revering M.D.   On: 08/25/2019 10:51   CT PERC CHOLECYSTOSTOMY  Result Date: 08/24/2019 INDICATION: Acute cholecystitis. Poor operative candidate. Please perform image guided placement cholecystostomy tube for infection source control purposes As the interventional radiology suite is currently being utilized for a complex endoleak procedure, we will proceed with placement of the cholecystostomy tube with ultrasound and CT guidance to facilitate drainage. EXAM: ULTRASOUND AND FLUOROSCOPIC-GUIDED CHOLECYSTOSTOMY TUBE PLACEMENT COMPARISON:  CT abdomen pelvis-08/23/2019; nuclear medicine HIDA scan-08/23/2018 MEDICATIONS: The patient is currently admitted to the hospital and on intravenous antibiotics. Antibiotics were administered within an appropriate time frame prior to skin puncture. ANESTHESIA/SEDATION: Moderate (conscious) sedation was employed during this procedure. A total of Versed 0.5 mg and Fentanyl 12.5 mcg was administered intravenously. Moderate Sedation Time: 20 minutes. The patient's level of consciousness and vital signs were monitored continuously by radiology nursing throughout the procedure under my direct supervision. CONTRAST:  None FLUOROSCOPY TIME:  Not provided COMPLICATIONS: None immediate. PROCEDURE: Informed written consent was obtained from the patient's family after a discussion of the risks, benefits and alternatives to treatment. Questions regarding the procedure were encouraged and answered. A timeout was performed prior to the initiation of the procedure. The right upper abdominal quadrant was prepped and draped in the usual sterile fashion, and a sterile  drape was  applied covering the operative field. Maximum barrier sterile technique with sterile gowns and gloves were used for the procedure. A timeout was performed prior to the initiation of the procedure. Local anesthesia was provided with 1% lidocaine with epinephrine. Ultrasound scanning of the right upper quadrant demonstrates a moderately dilated gallbladder with minimal amount of gallbladder wall thickening, however this is nonspecific finding in the setting known trace intra-abdominal ascites. Utilizing a transhepatic approach, an 18 gauge needle was advanced into the gallbladder under direct ultrasound guidance. An ultrasound image was saved for documentation purposes. Short Amplatz wire was coiled in gallbladder lumen. Appropriate positioning was confirmed with CT imaging. Next, the track was serially dilated allowing placement of a 10.2-French Cook cholecystomy tube into the gallbladder lumen where it was coiled and locked. Appropriate positioning was confirmed with CT imaging. Bile was aspirated and a small amount of aspirated bile was capped and sent to the laboratory for analysis. The catheter was secured to the skin with an interrupted suture, connected to a drainage bag and a dressing was applied. The patient tolerated the procedure well without immediate post procedural complication. IMPRESSION: Successful ultrasound and CT guided placement of a 10.2 French cholecystostomy tube. A small amount of aspirated bile was capped and sent to the laboratory for analysis Electronically Signed   By: Sandi Mariscal M.D.   On: 08/24/2019 14:18

## 2019-08-26 NOTE — Progress Notes (Addendum)
Patient ID: Todd [name], male   DOB: 1955-12-30, 64 y.o.   MRN: [id number]     Advanced Heart Failure Rounding Note  PCP-Cardiologist: No primary care provider on file.   Subjective:    08/02/19 CABG 08/03/19 VF arrest. 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  MAP stable.  Co-ox 64%.   In NSR on amiodarone at 30, no further NSVT.    I/Os negative yesterday, weight down.  CVP 5 this morning.  He got 1 dose IV Lasix yesterday.   On CPAP via trach currently. Stable.   Tm 101 overnight per nurse though not recorded.  Meropenem and [name] stopped 7/14, Zosyn started.  Now with Candida from BAL and blood culture, started on Diflucan.    Now getting tube feeds via post-pyloric tube.   Creatinine 1.46 => 1.53 => 1.35 => 1.57 => 1.71 => 1.4 => 1.69 => 1.63 => 1.45.  Patient now following commands at times for nurse. CT head negative 7/11. EEG with evidence for diffuse encephalopathy. NH3 38.   Jaundiced with consistently elevated bilirubin (direct), tbili decreasing slowly at 12 today.  Abdominal US with gallbladder sludge, no definite cholecystitis.  HIDA scan with patent CBD but no gallbladder activity. Abdominal CT with gallbladder sludge. Now s/p cholecystostomy tube.   Objective:   Weight Range: 67.9 kg Body mass index is 22.11 kg/m.   Vital Signs:   Temp:  [97.6 F (36.4 C)-99.1 F (37.3 C)] 98.2 F (36.8 C) (07/18 0314) Pulse Rate:  [76-96] 78 (07/18 0713) Resp:  [16-33] 18 (07/18 0713) BP: (104-121)/(40-49) 121/49 (07/18 0713) SpO2:  [93 %-100 %] 100 % (07/18  0713) Arterial Line BP: (99-144)/(41-64) 131/52 (07/18 0445) FiO2 (%):  [40 %] 40 % (07/18 0713) Weight:  [67.9 kg] 67.9 kg (07/18 0450) Last BM Date: 08/25/19  Weight change: Filed Weights   08/24/19 0500 08/25/19 0500 08/26/19 0450  Weight: 68.2 kg 68.4 kg 67.9 kg    Intake/Output:   Intake/Output Summary (Last 24 hours) at 08/26/2019 0755 Last data filed at 08/26/2019 0448 Gross per 24 hour  Intake 2048.17 ml  Output 3475 ml  Net -1426.83 ml      Physical Exam    General: NAD, awake Neck: No JVD, no thyromegaly or thyroid nodule.  Lungs: Decreased at bases bilaterally.  CV: Nondisplaced PMI.  Heart regular S1/S2, no S3/S4, no murmur.  1+ ankle edema.  Abdomen: Soft, nontender, no hepatosplenomegaly, no distention.  Skin: Jaundiced.  Neurologic: Awake on vent, per nurse will follow commands at times.   Extremities: No clubbing or cyanosis.  HEENT: Normal.    Telemetry   NSR 80s.  Personally reviewed   Labs    CBC Recent Labs    08/25/19 0302 08/26/19 0228  WBC 19.4* 24.7*  HGB 8.1* 8.7*  HCT 28.8* 29.3*  MCV 98.3 97.0  PLT 392 712*   Basic Metabolic Panel Recent Labs    08/25/19 1538 08/25/19 1538 08/25/19 1553 08/25/19 1553 08/26/19 0032 08/26/19 0228  NA 150*   < > 151*   < > 147* 148*  K 3.8   < > 4.2   < > 4.6 3.8  CL 109   < > 110   < > 109 109  CO2 29   < > 24   < > 28 29  GLUCOSE 204*   < > 196*   < > 234* 170*  BUN 72*   < > 76*   < > 65* 65*  CREATININE 1.57*   < > 1.57*   < > 1.42* 1.45*  CALCIUM 8.2*   < > 8.5*   < > 8.3* 8.2*  MG  --   --   --   --  2.4 2.4  PHOS 4.0  --  4.4  --   --   --    < > = values in this interval not displayed.   Liver Function Tests Recent Labs    08/25/19 0302 08/25/19 1538 08/25/19 1553 08/26/19 0228  AST 183*  --   --  166*  ALT 181*  --   --  163*  ALKPHOS 499*  --   --  576*  BILITOT 12.1*  --   --  12.0*  PROT 6.7  --   --  6.1*  ALBUMIN 2.7*   < > 2.6* 2.3*   < > = values in this  interval not displayed.   No results for input(s): LIPASE, AMYLASE in the last 72 hours. Cardiac Enzymes No results for input(s): CKTOTAL, CKMB, CKMBINDEX, TROPONINI in the last 72 hours.  BNP: BNP (last 3 results) Recent Labs    07/26/19 1236  BNP 2,568.2*    ProBNP (last 3 results) No results for input(s): PROBNP in the last 8760 hours.   D-Dimer No results for input(s): DDIMER in the last 72 hours. Hemoglobin A1C No results for input(s): HGBA1C in the last 72 hours. Fasting Lipid Panel Recent Labs    08/26/19 0228  TRIG 236*   Thyroid Function Tests No results for input(s): TSH, T4TOTAL, T3FREE, THYROIDAB in the last 72 hours.  Invalid input(s): FREET3  Other results:   Imaging    No results found.   Medications:     Scheduled Medications: . artificial tears   Both Eyes Q8H  . aspirin  81 mg Per Tube Daily  . chlorhexidine gluconate (MEDLINE KIT)  15 mL Mouth Rinse BID  . Chlorhexidine Gluconate Cloth  6 each Topical Daily  . collagenase   Topical Daily  . docusate  200 mg Per Tube Daily  . feeding supplement (PROSource TF)  45 mL Per Tube BID  . free water  400 mL Per Tube Q4H  . insulin aspart  0-24 Units Subcutaneous Q4H  . insulin aspart  5 Units Subcutaneous Q4H  . insulin detemir  5 Units Subcutaneous BID  . ipratropium  0.5 mg Nebulization Q6H  . levalbuterol  1.25 mg Nebulization Q6H  . mouth rinse  15 mL Mouth Rinse 10 times per day  . midodrine  5 mg Per Tube TID WC  . pantoprazole (PROTONIX) IV  40 mg Intravenous QHS  . potassium chloride  40 mEq Per Tube BID  . sodium chloride flush  10-40 mL Intracatheter Q12H  . sodium chloride flush  10-40 mL Intracatheter Q12H  . sodium chloride flush  3 mL Intravenous Q12H  . sodium chloride flush  5 mL Intracatheter Q8H    Infusions: . sodium chloride 250 mL (08/13/19 1412)  . sodium chloride Stopped (08/13/19 1631)  . amiodarone 30 mg/hr (08/26/19 0448)  . bivalirudin (ANGIOMAX) infusion  0.5 mg/mL (Non-ACS indications) 0.055 mg/kg/hr (08/26/19 0448)  .  feeding supplement (PIVOT 1.5 CAL) 60 mL/hr at 08/25/19 1700  . fluconazole (DIFLUCAN) IV Stopped (08/26/19 0347)  . lactated ringers 20 mL/hr at 08/10/19 1705  . lactated ringers 10 mL/hr at 08/18/19 0054  . milrinone 0.125 mcg/kg/min (08/26/19 0448)  . norepinephrine (LEVOPHED) Adult infusion 35 mcg/min (08/26/19 0448)  . piperacillin-tazobactam (ZOSYN)  IV 3.375 g (08/26/19 7371)  . vasopressin Stopped (08/25/19 0923)    PRN Medications: sodium chloride, sodium chloride, dextrose, fentaNYL (SUBLIMAZE) injection, metoprolol tartrate, midazolam, ondansetron (ZOFRAN) IV, sodium chloride flush, sodium chloride flush, sodium chloride flush   Assessment/Plan   1. Shock: At this point, think mixed cardiogenic/septic.  Echo with EF 20-25%, moderate RV dysfunction.  VA ECMO post-VF arrest, cannulated 6/25.  Stable s/p return to OR for mediastinal re-exploration due to bleeding on 6/25.  Decannulated from [place] ECMO on 08/06/19. Impella 5.5 placed. Underwent emergent chest re-exploration on 6/29 for tamponade and clot removal. Chest closed 7/2.  Impella 5.5 removed 7/9.  This morning, he is on NE 27, milrinone 0.125, off vasopressin.  Suspect component of septic/vasodilatory shock with fever, elevated WBCs.  Co-ox 64%, CVP 5.  Creatinine 1.45 today, stable.  - Start midodrine 5 mg tid and continue to wean NE.  - CVP 5 and weight down a lot, will hold Lasix today.  2. 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  Prolonged VF arrest on 6/25. Chest closed 7/2.  - ASA  - Hold Crestor with elevated LFTs.  3. Cardiac arrest: VF arrest with prolonged code. Still with episodes of VT/NSVT in setting of low K (difficult to effectively replace).  - Continue amiodarone 30 mg/hr.   - Eventual evaluation for ICD or lifevest at d/c - Keep K > 4.0 Mg > 2.0 4. Acute hypoxemic respiratory failure: In setting of cardiac  arrest.  H/o COPD.  Bibasilar infiltrates, possible HCAP.  Now with tracheostomy. On Zosyn + diflucan with candida in BAL and blood culture.  Currently CPAP via trach. - Trach collar trials per CCM.  5. Acute blood loss anemia: Now s/p mediastinal re-exploration x 3. Hgb stable today.  CT drainage has slowed.   - Now on bivalirudin gtt. 6. 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  Platelets have trended back up.  7. ID: Suspect HCAP initially, on meropenem/anidulafungin/vanco initially with ongoing fevers and leukocytosis. Venous dopplers negative for DVT. Possible drug fever from meropenem, he was switched to Zosyn on 7/14 and is now afebrile.  He had abdominal US with gallbladder sludge, no definite cholecystitis.  HIDA scan done, GB does not opacify => ?acalculous cholecystitis => now with cholecystostomy tube.  Most recently, Candida in blood culture and BAL, he is on Diflucan.  Still with fever to 101 per nurse overnight though not recorded.  - ID following.  - Continue Zosyn + Diflucan.  8. Elevated bilirubin: Mostly direct = likely cholestatic/shock liver/RV failure predominantly.   Abdominal US with evidence for cirrhosis, sludge in GB but no definite acute cholecystitis. NH3 was not significantly elevated. Tbili 12 today, gradually lower.  HIDA scan showed patent common bile duct, so not obstructive stone causing elevated bilirubin.  9. Ileus: Improved.  Getting TFs.  10. Atrial fibrillation/flutter: Paroxysmal. Back in NSR today.     - Continue amiodarone gtt.  - bivalirudin gtt.  11. Neuro: Now following commands per nursing. CT head negative on 7/11. EEG with e/o diffuse encephalopathy.  NH3 38.  12. Hypernatremia: Continue free water boluses to  400 cc q4 hrs, sodium lower today.   CRITICAL CARE Performed by: Loralie Champagne  Total critical care time: 40 minutes at bedside  Critical care time was exclusive of separately billable procedures and treating other  patients.  Critical care was necessary to treat or prevent imminent or life-threatening deterioration.  Critical care was time spent personally by me on the following activities: development of treatment plan with patient and/or surrogate as well as nursing, discussions with consultants, evaluation of patient's response to treatment, examination of patient, obtaining history from patient or surrogate, ordering and performing treatments and interventions, ordering and review of laboratory studies, ordering and review of radiographic studies, pulse oximetry and re-evaluation of patient's condition.   Length of Stay: [place], MD  08/26/2019, 7:55 AM

## 2019-08-26 NOTE — Progress Notes (Signed)
Patient ID: Todd [name], male   DOB: 1955-03-08, 64 y.o.   MRN: [id number]         [facility]  Date of Admission:  07/26/2019   Total days of antibiotics 25        Day 5 piperacillin tazobactam        Day 3 fluconazole  ASSESSMENT: Todd [name] appears to be somewhat improved recently but he remains on pressors and Dr. Claris Gladden note indicates that he may have had fever to 101 degrees overnight that was not charted.  He has grown Candida from 1 blood culture, BAL and pleural fluid cultures.  Repeat blood cultures are negative so far.  Bile cultures are negative so far  PLAN: 1. Continue piperacillin tazobactam and fluconazole 2. Await results of repeat blood cultures and bile cultures  Principal Problem:   Acute systolic heart failure (HCC) Active Problems:   Candidemia (HCC)   CHF (congestive heart failure) (HCC)   Chronic pain syndrome   Mixed hyperlipidemia   Moderate recurrent major depression (HCC)   Hypothyroidism   Type 2 diabetes mellitus with hyperglycemia (HCC)   AKI (acute kidney injury) ([place])   Coronary artery disease involving native coronary artery of native heart with unstable angina pectoris ([place])   Coronary artery disease   Malnutrition of moderate degree   Pressure injury of skin   Acute on chronic respiratory failure (HCC)   Acute respiratory failure (HCC)   S/P CABG (coronary artery bypass graft)   Scheduled Meds: . amiodarone  200 mg Per Tube BID  . artificial tears   Both Eyes Q8H  . aspirin  81 mg Per Tube Daily  . chlorhexidine gluconate (MEDLINE KIT)  15 mL Mouth Rinse BID  . Chlorhexidine Gluconate Cloth  6 each Topical Daily  . collagenase   Topical Daily  . docusate  200 mg Per Tube Daily  . feeding supplement (PROSource TF)  45 mL Per Tube BID  . free water  400 mL Per Tube Q4H  . insulin aspart  0-24 Units Subcutaneous Q4H  . insulin aspart  5 Units Subcutaneous Q4H  . insulin detemir  5 Units Subcutaneous BID  .  ipratropium  0.5 mg Nebulization Q6H  . levalbuterol  1.25 mg Nebulization Q6H  . mouth rinse  15 mL Mouth Rinse 10 times per day  . midodrine  5 mg Per Tube TID WC  . pantoprazole (PROTONIX) IV  40 mg Intravenous QHS  . potassium chloride  40 mEq Per Tube BID  . sodium chloride flush  10-40 mL Intracatheter Q12H  . sodium chloride flush  10-40 mL Intracatheter Q12H  . sodium chloride flush  3 mL Intravenous Q12H  . sodium chloride flush  5 mL Intracatheter Q8H   Continuous Infusions: . sodium chloride 250 mL (08/13/19 1412)  . sodium chloride Stopped (08/13/19 1631)  . bivalirudin (ANGIOMAX) infusion 0.5 mg/mL (Non-ACS indications) 0.055 mg/kg/hr (08/26/19 0900)  . feeding supplement (PIVOT 1.5 CAL) 60 mL/hr at 08/25/19 1700  . fluconazole (DIFLUCAN) IV Stopped (08/26/19 0347)  . lactated ringers 20 mL/hr at 08/10/19 1705  . lactated ringers 10 mL/hr at 08/18/19 0054  . milrinone 0.125 mcg/kg/min (08/26/19 1200)  . norepinephrine (LEVOPHED) Adult infusion 27 mcg/min (08/26/19 1200)  . piperacillin-tazobactam (ZOSYN)  IV 12.5 mL/hr at 08/26/19 0700  . vasopressin Stopped (08/25/19 0923)   PRN Meds:.sodium chloride, sodium chloride, dextrose, fentaNYL (SUBLIMAZE) injection, metoprolol tartrate, midazolam, ondansetron (ZOFRAN) IV, sodium chloride flush, sodium chloride flush,  sodium chloride flush   Review of Systems: Review of Systems  Unable to perform ROS: Intubated    Allergies  Allergen Reactions  . Empagliflozin     Other reaction(s): diarrhea  . 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 Other     Other reaction(s): Unknown  . Simvastatin     Other reaction(s): diarrhea  . Sitagliptin     Other reaction(s): diarrhea/abd pain    OBJECTIVE: Vitals:   08/26/19 0800 08/26/19 0820 08/26/19 1200 08/26/19 1226  BP:  (!) 121/48  (!) 102/40  Pulse: 81 79 81 83  Resp: 19 19 (!) 22 (!) 24  Temp:  98.3 F (36.8 C)    TempSrc:  Oral    SpO2:  100% 100% 100% 100%  Weight:      Height:       Body mass index is 22.11 kg/m.  Physical Exam Constitutional:      Comments: He is sleeping.  He just received pain medication.  Cardiovascular:     Rate and Rhythm: Normal rate and regular rhythm.     Heart sounds: No murmur heard.   Pulmonary:     Breath sounds: Normal breath sounds.     Comments: He has a tracheostomy tube and bilateral chest tubes. Chest:    Abdominal:     General: There is no distension.     Palpations: Abdomen is soft.     Comments: Rectal tube in place.  Skin:    Comments: Left upper arm PICC in place.  He has a saline lock IV in his right arm.     Lab Results Lab Results  Component Value Date   WBC 24.7 (H) 08/26/2019   HGB 8.7 (L) 08/26/2019   HCT 29.3 (L) 08/26/2019   MCV 97.0 08/26/2019   PLT 406 (H) 08/26/2019    Lab Results  Component Value Date   CREATININE 1.45 (H) 08/26/2019   BUN 65 (H) 08/26/2019   NA 148 (H) 08/26/2019   K 3.8 08/26/2019   CL 109 08/26/2019   CO2 29 08/26/2019    Lab Results  Component Value Date   ALT 163 (H) 08/26/2019   AST 166 (H) 08/26/2019   ALKPHOS 576 (H) 08/26/2019   BILITOT 12.0 (H) 08/26/2019     Microbiology: Recent Results (from the past 240 hour(s))  Aerobic/Anaerobic Culture (surgical/deep wound)     Status: None   Collection Time: 08/17/19  2:32 PM   Specimen: Wound  Result Value Ref Range Status   Specimen Description WOUND  Final   Special Requests NONE  Final   Gram Stain NO WBC SEEN NO ORGANISMS SEEN   Final   Culture   Final    No growth aerobically or anaerobically. Performed at [facility], [place] [address] [place], [place] [address]    Report Status 08/22/2019 FINAL  Final  Culture, fungus without smear     Status: Abnormal (Preliminary result)   Collection Time: 08/20/19  2:00 PM   Specimen: Bronchoalveolar Lavage; Other  Result Value Ref Range Status   Specimen Description BRONCHIAL ALVEOLAR LAVAGE  Final    Special Requests   Final    Normal Performed at [facility], [place] [address] [place], [place] [address]    Culture CANDIDA ALBICANS (A)  Final   Report Status PENDING  Incomplete  Culture, bal-quantitative     Status: Abnormal   Collection Time: 08/20/19  2:00  PM   Specimen: Bronchoalveolar Lavage; Respiratory  Result Value Ref Range Status   Specimen Description BRONCHIAL ALVEOLAR LAVAGE  Final   Special Requests NONE  Final   Gram Stain   Final    FEW WBC PRESENT, PREDOMINANTLY PMN NO ORGANISMS SEEN Performed at [facility], [address] [place], [place] [address]    Culture (A)  Final    4,000 COLONIES/mL CANDIDA TROPICALIS 3,000 COLONIES/mL CANDIDA ALBICANS    Report Status 08/25/2019 FINAL  Final  Body fluid culture     Status: None   Collection Time: 08/22/19  9:14 AM   Specimen: Pleura; Body Fluid  Result Value Ref Range Status   Specimen Description PLEURAL  Final   Special Requests NONE  Final   Gram Stain   Final    NO WBC SEEN NO ORGANISMS SEEN Performed at [facility], [address], [place], [place] [address]    Culture RARE CANDIDA PARAPSILOSIS  Final   Report Status 08/25/2019 FINAL  Final  Culture, blood (routine x 2)     Status: Abnormal   Collection Time: 08/22/19 12:58 PM   Specimen: BLOOD RIGHT HAND  Result Value Ref Range Status   Specimen Description BLOOD RIGHT HAND  Final   Special Requests   Final    BOTTLES DRAWN AEROBIC ONLY Blood Culture adequate volume   Culture  Setup Time   Final    AEROBIC BOTTLE ONLY YEAST CRITICAL RESULT CALLED TO, READ BACK BY AND VERIFIED WITH: Karsten Ro [facility] 08/25/19 0123 JDW Performed at [facility], [place] [address] [place], [place] [address]    Culture CANDIDA PARAPSILOSIS (A)  Final   Report Status 08/26/2019 FINAL  Final  Culture, blood (routine x 2)     Status: None (Preliminary result)   Collection Time: 08/22/19 12:58 PM   Specimen: BLOOD RIGHT HAND  Result Value Ref Range  Status   Specimen Description BLOOD RIGHT HAND  Final   Special Requests   Final    BOTTLES DRAWN AEROBIC ONLY Blood Culture adequate volume   Culture   Final    NO GROWTH 3 DAYS Performed at [facility],  [place] [address], [place], [place] [address]    Report Status PENDING  Incomplete  Blood Culture ID Panel (Reflexed)     Status: Abnormal   Collection Time: 08/22/19 12:58 PM  Result Value Ref Range Status   Enterococcus species NOT DETECTED NOT DETECTED Final   Listeria monocytogenes NOT DETECTED NOT DETECTED Final   Staphylococcus species NOT DETECTED NOT DETECTED Final   Staphylococcus aureus (BCID) NOT DETECTED NOT DETECTED Final   Streptococcus species NOT DETECTED NOT DETECTED Final   Streptococcus agalactiae NOT DETECTED NOT DETECTED Final   Streptococcus pneumoniae NOT DETECTED NOT DETECTED Final   Streptococcus pyogenes NOT DETECTED NOT DETECTED Final   Acinetobacter baumannii NOT DETECTED NOT DETECTED Final   Enterobacteriaceae species NOT DETECTED NOT DETECTED Final   Enterobacter cloacae complex NOT DETECTED NOT DETECTED Final   Escherichia coli NOT DETECTED NOT DETECTED Final   Klebsiella oxytoca NOT DETECTED NOT DETECTED Final   Klebsiella pneumoniae NOT DETECTED NOT DETECTED Final   Proteus species NOT DETECTED NOT DETECTED Final   Serratia marcescens NOT DETECTED NOT DETECTED Final   Haemophilus influenzae NOT DETECTED NOT DETECTED Final   Neisseria meningitidis NOT DETECTED NOT DETECTED Final   Pseudomonas aeruginosa NOT DETECTED NOT DETECTED Final   Candida albicans NOT DETECTED NOT DETECTED Final  Candida glabrata NOT DETECTED NOT DETECTED Final   Candida krusei NOT DETECTED NOT DETECTED Final   Candida parapsilosis DETECTED (A) NOT DETECTED Final    Comment: CRITICAL RESULT CALLED TO, READ BACK BY AND VERIFIED WITH: J LEDFORD PHARMD 08/25/19 0123 JDW    Candida tropicalis NOT DETECTED NOT DETECTED Final    Comment: Performed at [facility],  [address] [place], [place] [address]  Aerobic/Anaerobic Culture (surgical/deep wound)     Status: None (Preliminary result)   Collection Time: 08/24/19  1:35 PM   Specimen: Abscess  Result Value Ref Range Status   Specimen Description ABSCESS  Final   Special Requests DRAIN  Final   Gram Stain   Final    RARE WBC PRESENT, PREDOMINANTLY PMN NO ORGANISMS SEEN    Culture   Final    NO GROWTH 2 DAYS Performed at [facility], [place] [address] [place], [place] [address]    Report Status PENDING  Incomplete    Michel Bickers, MD [facility] [place] [phone] pager   [phone] cell 08/26/2019, 1:12 PM

## 2019-08-26 NOTE — Progress Notes (Signed)
° °   °  [address]       [place] [address]             [phone]        CARDIOTHORACIC SURGERY PROGRESS NOTE   R9 Days Post-Op Procedure(s) (LRB): REMOVAL OF IMPELLA LEFT VENTRICULAR ASSIST DEVICE (N/A) TRANSESOPHAGEAL ECHOCARDIOGRAM (TEE) (N/A)  Subjective: No events last 24 hrs.  Opens eyes.  Occasionally follows some simple commands.  Objective: Vital signs: BP Readings from Last 1 Encounters:  08/26/19 (!) 121/48   Pulse Readings from Last 1 Encounters:  08/26/19 79   Resp Readings from Last 1 Encounters:  08/26/19 19   Temp Readings from Last 1 Encounters:  08/26/19 98.3 F (36.8 C) (Oral)    Hemodynamics: CVP:  [6 mmHg-15 mmHg] 6 mmHg  Mixed venous co-ox 64%   Physical Exam:  Rhythm:   sinus  Breath sounds: Coarse but clear  Heart sounds:  RRR  Incisions:  Clean and dry  Abdomen:  Soft, non-distended, tolerating tube feeds  Extremities:  Warm, well-perfused  Chest tubes:  decreasing volume thin serosanguinous output, no air leak    Intake/Output from previous day: 07/17 0701 - 07/18 0700 In: 2179.7 [I.V.:1364.6; NG/GT:60; IV Piggyback:755.1] Out: 3995 [Urine:2835; Drains:40; Stool:150; Chest Tube:970] Intake/Output this shift: Total I/O In: 72.5 [I.V.:72.5] Out: 160 [Urine:160]  Lab Results:  CBC: Recent Labs    08/25/19 0302 08/26/19 0228  WBC 19.4* 24.7*  HGB 8.1* 8.7*  HCT 28.8* 29.3*  PLT 392 406*    BMET:  Recent Labs    08/26/19 0032 08/26/19 0228  NA 147* 148*  K 4.6 3.8  CL 109 109  CO2 28 29  GLUCOSE 234* 170*  BUN 65* 65*  CREATININE 1.42* 1.45*  CALCIUM 8.3* 8.2*     PT/INR:  No results for input(s): LABPROT, INR in the last 72 hours.  CBG (last 3)  Recent Labs    08/25/19 2352 08/26/19 0346 08/26/19 0717  GLUCAP 180* 150* 158*    ABG    Component Value Date/Time   PHART 7.519 (H) 08/23/2019 1805   PCO2ART 52.0 (H) 08/23/2019 1805   PO2ART 108 08/23/2019 1805   HCO3 41.8 (H)  08/23/2019 1805   TCO2 43 (H) 08/23/2019 1805   ACIDBASEDEF 1.0 08/08/2019 1115   O2SAT 64.4 08/26/2019 0228    CXR: n/a  Assessment/Plan: S/P Procedure(s) (LRB): REMOVAL OF IMPELLA LEFT VENTRICULAR ASSIST DEVICE (N/A) TRANSESOPHAGEAL ECHOCARDIOGRAM (TEE) (N/A)  Clinically stable  Maintaining NSR w/ stable hemodynamics on low dose milrinone, decreasing dose levophed and vasopressin off Afebrile, WBC up further 24,700 O2 sats 100% on 40% FiO2, tolerating TC trials Tolerating full support tube feeds Neuro status stable/improved Anemia stable, platelet count normal on bivalirudin for HITT Hypernatremia stable, slightly worse Blood cultures and pleural fluid cultures growing Candida parapsilosis Currently on Zosyn + Diflucan  [place]cell [place], MD 08/26/2019 9:31 AM

## 2019-08-27 ENCOUNTER — Inpatient Hospital Stay (HOSPITAL_COMMUNITY): Payer: Medicare Other

## 2019-08-27 DIAGNOSIS — J80 Acute respiratory distress syndrome: Secondary | ICD-10-CM

## 2019-08-27 DIAGNOSIS — R0682 Tachypnea, not elsewhere classified: Secondary | ICD-10-CM

## 2019-08-27 DIAGNOSIS — E440 Moderate protein-calorie malnutrition: Secondary | ICD-10-CM

## 2019-08-27 DIAGNOSIS — K819 Cholecystitis, unspecified: Secondary | ICD-10-CM

## 2019-08-27 DIAGNOSIS — R570 Cardiogenic shock: Secondary | ICD-10-CM

## 2019-08-27 DIAGNOSIS — R17 Unspecified jaundice: Secondary | ICD-10-CM

## 2019-08-27 LAB — COOXEMETRY PANEL
Carboxyhemoglobin: 1.8 % — ABNORMAL HIGH (ref 0.5–1.5)
Carboxyhemoglobin: 1.4 % (ref 0.5–1.5)
Carboxyhemoglobin: 2.2 % — ABNORMAL HIGH (ref 0.5–1.5)
Methemoglobin: 1.3 % (ref 0.0–1.5)
Methemoglobin: 0.9 % (ref 0.0–1.5)
Methemoglobin: 1.3 % (ref 0.0–1.5)
O2 Saturation: 47 %
O2 Saturation: 46.1 %
O2 Saturation: 56 %
Total hemoglobin: 8.6 g/dL — ABNORMAL LOW (ref 12.0–16.0)
Total hemoglobin: 8.5 g/dL — ABNORMAL LOW (ref 12.0–16.0)
Total hemoglobin: 8.1 g/dL — ABNORMAL LOW (ref 12.0–16.0)

## 2019-08-27 LAB — COMPREHENSIVE METABOLIC PANEL
ALT: 139 U/L — ABNORMAL HIGH (ref 0–44)
AST: 129 U/L — ABNORMAL HIGH (ref 15–41)
Albumin: 2.1 g/dL — ABNORMAL LOW (ref 3.5–5.0)
Alkaline Phosphatase: 496 U/L — ABNORMAL HIGH (ref 38–126)
Anion gap: 9 (ref 5–15)
BUN: 46 mg/dL — ABNORMAL HIGH (ref 8–23)
CO2: 25 mmol/L (ref 22–32)
Calcium: 8.1 mg/dL — ABNORMAL LOW (ref 8.9–10.3)
Chloride: 109 mmol/L (ref 98–111)
Creatinine, Ser: 1.09 mg/dL (ref 0.61–1.24)
GFR calc Af Amer: >60 mL/min (ref >60)
GFR calc non Af Amer: >60 mL/min (ref >60)
Glucose, Bld: 114 mg/dL — ABNORMAL HIGH (ref 70–99)
Potassium: 4.2 mmol/L (ref 3.5–5.1)
Sodium: 143 mmol/L (ref 135–145)
Total Bilirubin: 10.2 mg/dL — ABNORMAL HIGH (ref 0.3–1.2)
Total Protein: 5.7 g/dL — ABNORMAL LOW (ref 6.5–8.1)

## 2019-08-27 LAB — GLUCOSE, CAPILLARY
Glucose-Capillary: 100 mg/dL — ABNORMAL HIGH (ref 70–99)
Glucose-Capillary: 109 mg/dL — ABNORMAL HIGH (ref 70–99)
Glucose-Capillary: 116 mg/dL — ABNORMAL HIGH (ref 70–99)
Glucose-Capillary: 187 mg/dL — ABNORMAL HIGH (ref 70–99)
Glucose-Capillary: 194 mg/dL — ABNORMAL HIGH (ref 70–99)
Glucose-Capillary: 206 mg/dL — ABNORMAL HIGH (ref 70–99)
Glucose-Capillary: 40 mg/dL — CL (ref 70–99)

## 2019-08-27 LAB — RENAL FUNCTION PANEL
Albumin: 2.2 g/dL — ABNORMAL LOW (ref 3.5–5.0)
Anion gap: 8 (ref 5–15)
BUN: 43 mg/dL — ABNORMAL HIGH (ref 8–23)
CO2: 24 mmol/L (ref 22–32)
Calcium: 8.1 mg/dL — ABNORMAL LOW (ref 8.9–10.3)
Chloride: 112 mmol/L — ABNORMAL HIGH (ref 98–111)
Creatinine, Ser: 1.08 mg/dL (ref 0.61–1.24)
GFR calc Af Amer: >60 mL/min (ref >60)
GFR calc non Af Amer: >60 mL/min (ref >60)
Glucose, Bld: 220 mg/dL — ABNORMAL HIGH (ref 70–99)
Phosphorus: 3.5 mg/dL (ref 2.5–4.6)
Potassium: 4 mmol/L (ref 3.5–5.1)
Sodium: 144 mmol/L (ref 135–145)

## 2019-08-27 LAB — CBC
HCT: 29.4 % — ABNORMAL LOW (ref 39.0–52.0)
Hemoglobin: 8.5 g/dL — ABNORMAL LOW (ref 13.0–17.0)
MCH: 28.4 pg (ref 26.0–34.0)
MCHC: 28.9 g/dL — ABNORMAL LOW (ref 30.0–36.0)
MCV: 98.3 fL (ref 80.0–100.0)
Platelets: 380 10*3/uL (ref 150–400)
RBC: 2.99 MIL/uL — ABNORMAL LOW (ref 4.22–5.81)
RDW: 24.9 % — ABNORMAL HIGH (ref 11.5–15.5)
WBC: 29.1 10*3/uL — ABNORMAL HIGH (ref 4.0–10.5)
nRBC: 0.5 % — ABNORMAL HIGH (ref 0.0–0.2)

## 2019-08-27 LAB — CULTURE, BLOOD (ROUTINE X 2)
Culture: NO GROWTH
Special Requests: ADEQUATE

## 2019-08-27 LAB — BILIRUBIN, DIRECT: Bilirubin, Direct: 6.4 mg/dL — ABNORMAL HIGH (ref 0.0–0.2)

## 2019-08-27 LAB — TRIGLYCERIDES: Triglycerides: 213 mg/dL — ABNORMAL HIGH (ref <150)

## 2019-08-27 LAB — MAGNESIUM: Magnesium: 2.2 mg/dL (ref 1.7–2.4)

## 2019-08-27 LAB — APTT: aPTT: 72 seconds — ABNORMAL HIGH (ref 24–36)

## 2019-08-27 LAB — LACTATE DEHYDROGENASE: LDH: 264 U/L — ABNORMAL HIGH (ref 98–192)

## 2019-08-27 MED ORDER — MIDODRINE HCL 5 MG PO TABS
10.0000 mg | ORAL_TABLET | Freq: Three times a day (TID) | ORAL | Status: DC
  Administered 2019-08-27 – 2019-08-28 (×3): 10 mg
  Filled 2019-08-27 (×3): qty 2

## 2019-08-27 MED ORDER — PANTOPRAZOLE SODIUM 40 MG PO PACK
40.0000 mg | PACK | Freq: Every day | ORAL | Status: DC
  Administered 2019-08-27 – 2019-08-29 (×3): 40 mg
  Filled 2019-08-27 (×4): qty 20

## 2019-08-27 MED ORDER — ALBUMIN HUMAN 5 % IV SOLN
12.5000 g | Freq: Once | INTRAVENOUS | Status: AC
  Administered 2019-08-27: 12.5 g via INTRAVENOUS
  Filled 2019-08-27: qty 250

## 2019-08-27 MED FILL — Sodium Chloride IV Soln 0.9%: INTRAVENOUS | Qty: 100 | Status: AC

## 2019-08-27 MED FILL — Vasopressin IV Soln 20 Unit/ML (For IV Infusion): INTRAVENOUS | Qty: 1 | Status: AC

## 2019-08-27 NOTE — Progress Notes (Signed)
NAME:  Todd [name], MRN:  [id number], DOB:  1955/06/25, LOS: 73 ADMISSION DATE:  07/26/2019, CONSULTATION DATE:  08/03/19 REFERRING MD:  Roxan Hockey, CHIEF COMPLAINT:  ECMO   Brief History   VA ECMO for post CABG Vtach arrest  History of present illness   Presented with worsening dyspnea 6/17 c/w CHF exacerbation Cath with severe triple vessel disease Underwent CABG 6/24 Vtach arrest 6/25 Chest opened bedside and cardiac massage initiated as well as multiple cardioversions, amiodarone, bicarb etc Brought to OR and cannulated for [facility] ECMO PCCM consulted to assist with management Comorbidities include DM, heavy smoking, COPD  Past Medical History  Depression Ischemic cardiomyopathy HTN [facility] Events   6/24 CABG 6/25 VA cannulation 6/28 decannulated and impella placed 6/29 bedside re-exploration; s/p decannulation. Placement of impella device originally at p8 but decreased to p4 2/2 suction events overnight up to p6. Echo completed and repositioned device. Remained on considerable support with 8 epi and 46 norepi vaso 0.05. continued chest tube output with large clots noted. Heparin thru device. Replacement products ongoing. 60% 8 peep 6/30: bedside mediastinal re-exploration and clean out of hematoma with tamponade and worsening hemodynamics. Pt had large volume transfusion. rebolused with amio 2/2 nsvt episode.  Weight up 48 pounds.  Started diuresis, Lasix drip started. 7/1 iatrogenic respiratory alkalosis, vent rate decreased. 7/2: No significant issues overnight remains on pressors and Impella not tolerating tube feeds with high gastric output awaiting core track placement 7/3: started trickle TF  7/5: Swab removed, CVL and PICC placed. 7/13 Trach and BAL  7/16 - No events. Awake this AM and tracking. Still won't follow commands for me. Still having fevers. Successful ultrasound and CT guided placement of a 10.2 French cholecystostomy tube. A small amount of  aspirated bile was capped and sent to the laboratory for analysis  7/17 - - off sedation gtt. doing pressure support ventilation.  Beginning to follow some commands occasionally intermittently.  Very jaundiced.  Growing Candida in the blood and BAL.  Still febrile but T-max is coming down.  White count persistently elevated; currently at 19.4 okay.  Diflucan started. Currently on milrinone GTT, amiodarone gtt., vasopressin gtt., Levophed gtt. and bivalrudin gtt. Off epi gtt. Cards planning lasix x 1 today.    Consults:  Advanced heart failure, PCCM, TCTS  Procedures:  See below  Significant Diagnostic Tests:  6/22 spirometry with restrictive physiology, preserved FEV1/FVC CXR today with bilateral airspace disease, cannulas in good position, deep left sulcus that was present yesterday, no clear PTX 6/18 echo: LVEF 98-92%, grade I diastolic dysfunction 7/7: US Abdomen cholelithiasis without cholecystitis. Mild wall thinking in setting of liver disease and ascites.   Micro Data:  COVID and HIV Neg 7/3 respiratory-few Candida tropicalis 7/5 blood>> 7/5 respiratory-few Candida tropicalis 7/7 urine-NG 7/7 blood>>NG 7/9 wound>> NG 7/12 BAL> Candida tropicalis detected 08/22/2019: Blood BC ID Candida parapsilosis detected  Antimicrobials:  Vanc6/24>>7/13 Cefuroxime 6/24>> 6/25 merrem 6/26->7/14 Anidulafungin 7/8->7/14 Zosyn 7/14>> Diflucan 08/25/2019 >>  Interim history/subjective:  Continues milrinone and NE. More awake today.   Objective   Blood pressure (!) 109/41, pulse 78, temperature 98.4 F (36.9 C), temperature source Rectal, resp. rate (!) 21, height _0  (1.753 m), weight 67.9 kg, SpO2 100 %. CVP:  [2 mmHg-29 mmHg] 29 mmHg  Vent Mode: PRVC FiO2 (%):  [40 %] 40 % Set Rate:  [20 bmp] 20 bmp Vt Set:  [420 mL] 420 mL PEEP:  [5 Lane Pressure:  [11 JJH41-74  cmH20] 11 cmH20   Intake/Output Summary (Last 24 hours) at 08/27/2019 0739 Last data filed at  08/27/2019 0700 Gross per 24 hour  Intake 1970.32 ml  Output 3545 ml  Net -1574.68 ml   Filed Weights   08/25/19 0500 08/26/19 0450 08/27/19 0412  Weight: 68.4 kg 67.9 kg 67.9 kg  General Appearance:  Looks criticall ill and deconditioned Head:  Normocephalic, without obvious abnormality, atraumatic Eyes:  PERRL -yes, conjunctiva/corneas -jaundiced     Nose:  G tube -yes Throat:  ETT TUBE -no, OG tube -no.  Tracheostomy present plus Neck:  Supple,  No enlargement/tenderness/nodules Lungs: Clear to auscultation bilaterally, Ventilator   Synchrony -yes but paradoxical on pressure support.  Sternotomy stable. Heart:  S1 and S2 normal, no murmur, CVP -10.  Pressors -yes Abdomen:  Soft, no masses, no organomegaly Genitalia / Rectal:  Not done Extremities:  Extremities-intact Skin:  ntact in exposed areas . Sacral area -no decub Neurologic:  Sedation - no sedation infusion -> RASS - 0 . Moves all 4s - no - diffusely weak. CAM-ICU - cannot asses . Orientation - shakes head but diffusely weak  POC echo 7/19 shows decreased LVEF at 25% will LV dilatation. Mild MR , grade 1 diastolic dysfunction, PCWP estimated at 12 (breathing spontaneously on trach collar), Small RV with normal function. Unable to estimate RVSP or visualize IVC.   Resolved Hospital Problem list   N/A  Assessment & Plan:   Critically ill due to cardiogenic/hemorrhagic shock status post CABG complicated by VT arrest, S/p VA ECMO (decanulated 6/28). S/p impella placement (6/28> 7/9) Atrial flutter VT Critically ill due to acute hypoxic respiratory failure w/ need for mechanical ventilation - status post tracheostomy Weakness of critical disease Hyperbilirubinemia due to acalculous cholecystitis Possible cirrhosis per 7/8 [place]  Status post  percutaneous GB drain  T2DM Candida parapsilosis fungemia. Acute blood loss anemia and critical illness anemia Heparin-induced thrombocytopenia Acute kidney injury Acute encephalopathy  due to prolonged sedation Small left adrenal mass seen on CT scan of the abdomen August 24, 2019  Plan:   Improving slowly. Start trach collar trials. Continue to wean cardiac support.  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 Convert to warfarin eventually Appears euvolemic to slightly volume contracted, will try fluid challenge to see if improves pressor requirements which may be driven by fungemia and persistent vasoplegia follow CPB.   Daily Goals Checklist  Pain/Anxiety/Delirium protocol (if indicated): fentanyl as needed only VAP protocol (if indicated): bundle in place.  Respiratory support goals: trach collar trials for 2h bid, rest on PRVC.  Blood pressure target: Continue milrinone, wean norepinephrine to keep MAP >65. Will increase Midodrine DVT prophylaxis: Bivalirudin per pharmacy  Nutrition Status: High nutritional risk. Continue tube feeds.  GI prophylaxis: PPI per tube.  Fluid status goals: generalized edema improving - allow autoregulation.  Urinary catheter: Guide hemodynamic management Central lines: triple lumen PICC, arterial line.  Glucose control: T2 DM with adequate control on current regimen Mobility/therapy needs: PT for ROM Antibiotic de-escalation: Continue empiric Diflucan and Zosyn for biliary coverage.  Home medication reconciliation: on hold Daily labs: CMP, CBC Code Status: Full  Family Communication: per TCTS Disposition: ICU   LABS    PULMONARY Recent Labs  Lab 08/23/19 1805 08/23/19 1805 08/24/19 0335 08/25/19 0302 08/26/19 0228 08/27/19 0227 08/27/19 0438  PHART 7.519*  --   --   --   --   --   --   PCO2ART 52.0*  --   --   --   --   --   --  PO2ART 108  --   --   --   --   --   --   HCO3 41.8*  --   --   --   --   --   --   TCO2 43*  --   --   --   --   --   --   O2SAT 98.0   < > 63.8 60.7 64.4 47.0 46.1   < > = values in this interval not displayed.    CBC Recent  Labs  Lab 08/25/19 0302 08/26/19 0228 08/27/19 0218  HGB 8.1* 8.7* 8.5*  HCT 28.8* 29.3* 29.4*  WBC 19.4* 24.7* 29.1*  PLT 392 406* 380    COAGULATION Recent Labs  Lab 08/20/19 1202  INR 1.6*    CARDIAC  No results for input(s): TROPONINI in the last 168 hours. No results for input(s): PROBNP in the last 168 hours.   CHEMISTRY Recent Labs  Lab 08/23/19 1537 08/23/19 1805 08/24/19 1459 08/24/19 1501 08/25/19 0302 08/25/19 0302 08/25/19 0801 08/25/19 1419 08/25/19 1538 08/25/19 1538 08/25/19 1553 08/25/19 1553 08/26/19 0032 08/26/19 0032 08/26/19 0228 08/26/19 0228 08/26/19 1727 08/27/19 0218  NA 150*   < > 150*   < > 151*   < > 152*   < > 150*   < > 151*  --  147*  --  148*  --  146* 143  K 4.2   < > 3.9   < > 5.7*   < > 3.8   < > 3.8   < > 4.2   < > 4.6   < > 3.8   < > 3.9 4.2  CL 98   < > 106   < > 108   < > 109   < > 109   < > 110  --  109  --  109  --  111 109  CO2 35*   < > 28   < > 30   < > 31   < > 29   < > 24  --  28  --  29  --  26 25  GLUCOSE 180*   < > 193*   < > 191*   < > 174*   < > 204*   < > 196*  --  234*  --  170*  --  185* 114*  BUN 82*   < > 80*   < > 74*   < > 77*   < > 72*   < > 76*  --  65*  --  65*  --  51* 46*  CREATININE 1.62*   < > 1.87*   < > 1.63*   < > 1.71*   < > 1.57*   < > 1.57*  --  1.42*  --  1.45*  --  1.18 1.09  CALCIUM 8.5*   < > 8.6*   < > 8.2*   < > 8.5*   < > 8.2*   < > 8.5*  --  8.3*  --  8.2*  --  8.3* 8.1*  MG  --    < >  --    < > 2.4  --  2.5*  --   --   --   --   --  2.4  --  2.4  --   --  2.2  PHOS 6.9*  --  6.6*  --   --   --   --   --  4.0  --  4.4  --   --   --   --   --  3.7  --    < > = values in this interval not displayed.   Estimated Creatinine Clearance: 64.9 mL/min (by C-G formula based on SCr of 1.09 mg/dL).   LIVER Recent Labs  Lab 08/20/19 1202 08/20/19 1507 08/23/19 0312 08/23/19 1537 08/24/19 0335 08/24/19 1459 08/25/19 0302 08/25/19 0302 08/25/19 1538 08/25/19 1553 08/26/19 0228  08/26/19 1727 08/27/19 0218  AST  --    < > 359*  --  239*  --  183*  --   --   --  166*  --  129*  ALT  --    < > 267*  --  197*  --  181*  --   --   --  163*  --  139*  ALKPHOS  --    < > 675*  --  554*  --  499*  --   --   --  576*  --  496*  BILITOT  --    < > 14.9*  --  13.6*  --  12.1*  --   --   --  12.0*  --  10.2*  PROT  --    < > 5.7*  --  6.3*  --  6.7  --   --   --  6.1*  --  5.7*  ALBUMIN  --    < > 1.9*   < > 2.9*   < > 2.7*   < > 2.4* 2.6* 2.3* 2.2* 2.1*  INR 1.6*  --   --   --   --   --   --   --   --   --   --   --   --    < > = values in this interval not displayed.     INFECTIOUS Recent Labs  Lab 08/20/19 1202  PROCALCITON 2.01     ENDOCRINE CBG (last 3)  Recent Labs    08/26/19 2002 08/26/19 2344 08/27/19 0324  GLUCAP 101* 86 100*    CRITICAL CARE Performed by: Kipp Brood   Total critical care time: 45 minutes  Critical care time was exclusive of separately billable procedures and treating other patients.  Critical care was necessary to treat or prevent imminent or life-threatening deterioration.  Critical care was time spent personally by me on the following activities: development of treatment plan with patient and/or surrogate as well as nursing, discussions with consultants, evaluation of patient's response to treatment, examination of patient, obtaining history from patient or surrogate, ordering and performing treatments and interventions, ordering and review of laboratory studies, ordering and review of radiographic studies, pulse oximetry, re-evaluation of patient's condition and participation in multidisciplinary rounds.  Kipp Brood, MD [facility] ICU Physician [place]  Pager: [phone] Mobile: [phone] After hours: [phone].

## 2019-08-27 NOTE — Progress Notes (Addendum)
Patient ID: Todd [name], male   DOB: August 23, 1955, 64 y.o.   MRN: [id number]     Advanced Heart Failure Rounding Note  PCP-Cardiologist: No primary care provider on file.   Subjective:    08/02/19 CABG 08/03/19 VF arrest. Chest open at bedside 08/03/19 Back to OR for ECMO cannulation 08/03/19 Washout out for tamponade 08/04/19 Repeat bedside washout for tamponade 08/06/19 To OR for decannulation and Impella 5.5 08/07/19 Underwent emergent bedside chest exploration at bedside for tamponade  08/10/19 Chest closed 08/16/19 HIT positive, bivalirudin begun 08/17/19 Impella removed 08/20/19 Tracheostomy 08/24/19 cholecystostomy tube  Remains on Milrinone 0.125, NE 28, now off vasopressin.  MAPs in the 70s stable.  Early AM Co-ox 46% (repeat pending).   Brief runs of NSVT on tele this am (longest ~8 beats w/ torsades like pattern, polymorphic). K 4.2. Mg 2.2.   -2.5L in UOP yesterday. Net I/Os negative yesterday (1.6L), wt unchanged, 149 lb today.  CVP 10 this morning.   Meropenem and [name] stopped 7/14, Zosyn started.  Now with Candida from BAL and blood culture, started on Diflucan.  WBC trending up 19>>24>>29K but AF. ID following.   Now getting tube feeds via post-pyloric tube.   Creatinine continues to improve 1.69 => 1.63 => 1.45=>1.18=>1.09.  Patient now following commands at times for nurse. CT head negative 7/11. EEG with evidence for diffuse encephalopathy.   Jaundiced with consistently elevated bilirubin (direct), tbili decreasing slowly at 10 today.  Abdominal US with gallbladder sludge, no definite cholecystitis.  HIDA scan with patent CBD but no gallbladder activity. Abdominal CT with gallbladder sludge. Now s/p cholecystostomy tube.   Objective:   Weight Range: 67.9 kg Body mass index is 22.11 kg/m.   Vital Signs:   Temp:  [98.4 F (36.9 C)-99.5 F (37.5 C)] 98.6 F (37 C) (07/19 0800) Pulse Rate:  [73-98] 84 (07/19 0809) Resp:  [14-30] 21 (07/19 0809) BP: (102-109)/(40-41)  109/41 (07/18 1500) SpO2:  [98 %-100 %] 100 % (07/19 0809) Arterial Line BP: (98-145)/(38-58) 110/52 (07/19 0700) FiO2 (%):  [40 %] 40 % (07/19 0809) Weight:  [67.9 kg] 67.9 kg (07/19 0412) Last BM Date: 08/26/19  Weight change: Filed Weights   08/25/19 0500 08/26/19 0450 08/27/19 0412  Weight: 68.4 kg 67.9 kg 67.9 kg    Intake/Output:   Intake/Output Summary (Last 24 hours) at 08/27/2019 0935 Last data filed at 08/27/2019 0800 Gross per 24 hour  Intake 1837.84 ml  Output 3530 ml  Net -1692.16 ml      Physical Exam    General: ill appearing WM on vent through TC  Neck: + TC. JVD not well visuallized, no thyromegaly or thyroid nodule.  Lungs: Decreased at bases bilaterally.  CV: Nondisplaced PMI.  Heart regular S1/S2, no S3/S4, no murmur.  Trace bilateral LEE Abdomen: Soft, nontender, no hepatosplenomegaly, no distention.  Skin: Jaundiced.  Neurologic: awake on TC, LEU tremor  Extremities: trace bilateral LEE edema, distal extremities cool to touch  HEENT: Normal.    Telemetry   NSR 80s, brief runs of NSVT .  Personally reviewed   Labs    CBC Recent Labs    08/26/19 0228 08/27/19 0218  WBC 24.7* 29.1*  HGB 8.7* 8.5*  HCT 29.3* 29.4*  MCV 97.0 98.3  PLT 406* 836   Basic Metabolic Panel Recent Labs    08/25/19 1553 08/26/19 0032 08/26/19 0228 08/26/19 0228 08/26/19 1727 08/27/19 0218  NA 151*   < > 148*   < > 146* 143  K  4.2   < > 3.8   < > 3.9 4.2  CL 110   < > 109   < > 111 109  CO2 24   < > 29   < > 26 25  GLUCOSE 196*   < > 170*   < > 185* 114*  BUN 76*   < > 65*   < > 51* 46*  CREATININE 1.57*   < > 1.45*   < > 1.18 1.09  CALCIUM 8.5*   < > 8.2*   < > 8.3* 8.1*  MG  --    < > 2.4  --   --  2.2  PHOS 4.4  --   --   --  3.7  --    < > = values in this interval not displayed.   Liver Function Tests Recent Labs    08/26/19 0228 08/26/19 0228 08/26/19 1727 08/27/19 0218  AST 166*  --   --  129*  ALT 163*  --   --  139*  ALKPHOS 576*  --    --  496*  BILITOT 12.0*  --   --  10.2*  PROT 6.1*  --   --  5.7*  ALBUMIN 2.3*   < > 2.2* 2.1*   < > = values in this interval not displayed.   No results for input(s): LIPASE, AMYLASE in the last 72 hours. Cardiac Enzymes No results for input(s): CKTOTAL, CKMB, CKMBINDEX, TROPONINI in the last 72 hours.  BNP: BNP (last 3 results) Recent Labs    07/26/19 1236  BNP 2,568.2*    ProBNP (last 3 results) No results for input(s): PROBNP in the last 8760 hours.   D-Dimer No results for input(s): DDIMER in the last 72 hours. Hemoglobin A1C No results for input(s): HGBA1C in the last 72 hours. Fasting Lipid Panel Recent Labs    08/27/19 0218  TRIG 213*   Thyroid Function Tests No results for input(s): TSH, T4TOTAL, T3FREE, THYROIDAB in the last 72 hours.  Invalid input(s): FREET3  Other results:   Imaging    DG Chest Port 1 View  Result Date: 08/27/2019 CLINICAL DATA:  Respiratory failure EXAM: PORTABLE CHEST 1 VIEW COMPARISON:  08/25/2019 FINDINGS: Tracheostomy tube in satisfactory positioning. Enteric tube courses below the diaphragm with distal tip beyond the inferior margin of the film. Bilateral chest tubes remain in position. Left-sided PICC line terminates at the level of the superior cavoatrial junction. Post CABG changes. Stable cardiomediastinal contours. Diffuse interstitial prominence, left slightly greater than right, is similar in appearance to prior. Probable small left pleural effusion. No pneumothorax. IMPRESSION: 1. No significant interval change from prior. 2. Diffuse interstitial prominence, left slightly greater than right. Probable small left pleural effusion. 3. Support lines and tubes, as above. Electronically Signed   By: Davina Poke D.O.   On: 08/27/2019 08:05     Medications:     Scheduled Medications: . amiodarone  200 mg Per Tube BID  . artificial tears   Both Eyes Q8H  . aspirin  81 mg Per Tube Daily  . chlorhexidine gluconate  (MEDLINE KIT)  15 mL Mouth Rinse BID  . Chlorhexidine Gluconate Cloth  6 each Topical Daily  . collagenase   Topical Daily  . docusate  200 mg Per Tube Daily  . feeding supplement (PROSource TF)  45 mL Per Tube BID  . free water  400 mL Per Tube Q4H  . insulin aspart  0-24 Units Subcutaneous Q4H  .  insulin aspart  5 Units Subcutaneous Q4H  . insulin detemir  5 Units Subcutaneous BID  . ipratropium  0.5 mg Nebulization TID BM  . levalbuterol  1.25 mg Nebulization TID BM  . mouth rinse  15 mL Mouth Rinse 10 times per day  . midodrine  5 mg Per Tube TID WC  . pantoprazole (PROTONIX) IV  40 mg Intravenous QHS  . potassium chloride  40 mEq Per Tube BID  . sodium chloride flush  3 mL Intravenous Q12H  . sodium chloride flush  5 mL Intracatheter Q8H    Infusions: . sodium chloride 250 mL (08/13/19 1412)  . sodium chloride Stopped (08/13/19 1631)  . bivalirudin (ANGIOMAX) infusion 0.5 mg/mL (Non-ACS indications) 0.055 mg/kg/hr (08/27/19 0816)  . feeding supplement (PIVOT 1.5 CAL) 1,000 mL (08/26/19 1640)  . fluconazole (DIFLUCAN) IV Stopped (08/27/19 0447)  . lactated ringers 20 mL/hr at 08/10/19 1705  . lactated ringers 10 mL/hr at 08/18/19 0054  . milrinone 0.125 mcg/kg/min (08/27/19 0700)  . norepinephrine (LEVOPHED) Adult infusion 28 mcg/min (08/27/19 0700)  . piperacillin-tazobactam (ZOSYN)  IV 12.5 mL/hr at 08/27/19 0700  . vasopressin Stopped (08/25/19 0923)    PRN Medications: sodium chloride, sodium chloride, dextrose, fentaNYL (SUBLIMAZE) injection, metoprolol tartrate, midazolam, ondansetron (ZOFRAN) IV, sodium chloride flush   Assessment/Plan   1. Shock: At this point, think mixed cardiogenic/septic.  Echo with EF 20-25%, moderate RV dysfunction.  VA ECMO post-VF arrest, cannulated 6/25.  Stable s/p return to OR for mediastinal re-exploration due to bleeding on 6/25.  Decannulated from [place] ECMO on 08/06/19. Impella 5.5 placed. Underwent emergent chest re-exploration on 6/29  for tamponade and clot removal. Chest closed 7/2.  Impella 5.5 removed 7/9.  This morning, he is on NE 28, milrinone 0.125, off vasopressin.  Suspect component of septic/vasodilatory shock with fever, elevated WBCs.  Co-ox at 46% on early AM draw. Will redraw now. If low, increase milrinone to 0.25. Creatinine normalized today, 1.09 today.  - further increase midodrine to 10 mg tid and continue to wean NE.  - CVP 10 but wt stable at 149 lb (unchanged from yesterday) Continue to hold Lasix today.  2. 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  Prolonged VF arrest on 6/25. Chest closed 7/2.  - ASA  - Hold Crestor with elevated LFTs.  3. Cardiac arrest: VF arrest with prolonged code. Still with episodes of VT/NSVT in setting of low K (difficult to effectively replace).  - Continue amiodarone 30 mg/hr.   - Eventual evaluation for ICD or lifevest at d/c - Keep K > 4.0 Mg > 2.0 4. Acute hypoxemic respiratory failure: In setting of cardiac arrest.  H/o COPD.  Bibasilar infiltrates, possible HCAP.  Now with tracheostomy. On Zosyn + diflucan with candida in BAL and blood culture.  Currently CPAP via trach. - Trach collar trials per CCM.  5. Acute blood loss anemia: Now s/p mediastinal re-exploration x 3. Hgb stable today.  CT drainage has slowed.   - Now on bivalirudin gtt. 6. 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  Platelets have trended back up, but lower today, 406>>380. Monitor.  7. ID: Suspect HCAP initially, on meropenem/anidulafungin/vanco initially with ongoing fevers and leukocytosis. Venous dopplers negative for DVT. Possible drug fever from meropenem, he was switched to Zosyn on 7/14 and is now afebrile.  He had abdominal US with gallbladder sludge, no definite cholecystitis.  HIDA scan done, GB does not opacify => ?acalculous cholecystitis => now  with cholecystostomy tube.  Most recently, Candida in blood culture and BAL, he is on Diflucan.  AF past  24 hrs but WBC ct continues to rise, 19>>25>>29.   - ID following.  - Continue Zosyn + Diflucan.  8. Elevated bilirubin: Mostly direct = likely cholestatic/shock liver/RV failure predominantly.   Abdominal US with evidence for cirrhosis, sludge in GB but no definite acute cholecystitis. NH3 was not significantly elevated. Tbili 10 today, gradually lower.  HIDA scan showed patent common bile duct, so not obstructive stone causing elevated bilirubin.  9. Ileus: Improved.  Getting TFs.  10. Atrial fibrillation/flutter: Paroxysmal. Back in NSR today.     - Continue PO amio, 200 mg bid  - bivalirudin gtt.  11. Neuro: Now following commands per nursing. CT head negative on 7/11. EEG with e/o diffuse encephalopathy.  12. Hypernatremia: NA level normal today at 143. Hold free water boluses for now. Monitor   Length of Stay: [address] PA-C  08/27/2019, 9:35 AM  Patient seen with PA, agree with the above note.   Repeat co-ox today 56%, creatinine down to 1.09.  I/Os still negative despite no Lasix yesterday.  WBCs up to 29.  Tbili down to 10.2.   Patient remains on milrinone 0.125 and NE 25.   General: Awake Neck: Vent via trach. No JVD, no thyromegaly or thyroid nodule.  Lungs: Decreased at bases.  CV: Nondisplaced PMI.  Heart regular S1/S2, no S3/S4, no murmur.  No peripheral edema.   Abdomen: Soft, nontender, no hepatosplenomegaly, no distention.  Skin: Intact without lesions or rashes.  Neurologic: Follows commands, awake.  Extremities: No clubbing or cyanosis.  HEENT: Normal.   Suspect primarily septic shock at this point with Candidemia.  Adequate repeat co-ox at 56%.  - Increase midodrine to 10 mg tid and continue to try to wean NE.  - Continue current milrinone 0.125.   CVP 5 today on my measure, no Lasix today.   Continue Zosyn and fluconazole coverage, appreciate ID assistance.   Bilirubin decreasing.  Still has biliary drain, but do not think elevated tbili is  related to gallbladder pathology as HIDA scan showed patent common bile duct (unable to visualize GB, unable to rule out acalculous cholecystitis).   CRITICAL CARE Performed by: Loralie Champagne  Total critical care time: 35 minutes  Critical care time was exclusive of separately billable procedures and treating other patients.  Critical care was necessary to treat or prevent imminent or life-threatening deterioration.  Critical care was time spent personally by me on the following activities: development of treatment plan with patient and/or surrogate as well as nursing, discussions with consultants, evaluation of patient's response to treatment, examination of patient, obtaining history from patient or surrogate, ordering and performing treatments and interventions, ordering and review of laboratory studies, ordering and review of radiographic studies, pulse oximetry and re-evaluation of patient's condition.  Loralie Champagne 08/27/2019 1:43 PM

## 2019-08-27 NOTE — Progress Notes (Signed)
10 Days Post-Op Procedure(s) (LRB): REMOVAL OF IMPELLA LEFT VENTRICULAR ASSIST DEVICE (N/A) TRANSESOPHAGEAL ECHOCARDIOGRAM (TEE) (N/A) Subjective: Alert, denies pain  Objective: Vital signs in last 24 hours: Temp:  [98.3 F (36.8 C)-99.5 F (37.5 C)] 98.4 F (36.9 C) (07/19 0400) Pulse Rate:  [73-98] 78 (07/19 0700) Cardiac Rhythm: Normal sinus rhythm (07/19 0400) Resp:  [14-30] 21 (07/19 0700) BP: (102-121)/(40-48) 109/41 (07/18 1500) SpO2:  [98 %-100 %] 100 % (07/19 0700) Arterial Line BP: (98-145)/(38-58) 110/52 (07/19 0700) FiO2 (%):  [40 %] 40 % (07/19 0400) Weight:  [67.9 kg] 67.9 kg (07/19 0412)  Hemodynamic parameters for last 24 hours: CVP:  [2 mmHg-29 mmHg] 29 mmHg  Intake/Output from previous day: 07/18 0701 - 07/19 0700 In: 1970.3 [I.V.:934.6; NG/GT:642; IV Piggyback:393.7] Out: 3545 [Urine:2475; Drains:40; Stool:400; Chest Tube:630] Intake/Output this shift: No intake/output data recorded.  General appearance: alert and cooperative Neurologic: generally weak but moving all 4 Heart: regular rate and rhythm Lungs: clear to auscultation bilaterally Abdomen: normal findings: nondistended Wound: intact  Lab Results: Recent Labs    08/26/19 0228 08/27/19 0218  WBC 24.7* 29.1*  HGB 8.7* 8.5*  HCT 29.3* 29.4*  PLT 406* 380   BMET:  Recent Labs    08/26/19 1727 08/27/19 0218  NA 146* 143  K 3.9 4.2  CL 111 109  CO2 26 25  GLUCOSE 185* 114*  BUN 51* 46*  CREATININE 1.18 1.09  CALCIUM 8.3* 8.1*    PT/INR: No results for input(s): LABPROT, INR in the last 72 hours. ABG    Component Value Date/Time   PHART 7.519 (H) 08/23/2019 1805   HCO3 41.8 (H) 08/23/2019 1805   TCO2 43 (H) 08/23/2019 1805   ACIDBASEDEF 1.0 08/08/2019 1115   O2SAT 46.1 08/27/2019 0438   CBG (last 3)  Recent Labs    08/26/19 2002 08/26/19 2344 08/27/19 0324  GLUCAP 101* 86 100*    Assessment/Plan: S/P Procedure(s) (LRB): REMOVAL OF IMPELLA LEFT VENTRICULAR ASSIST  DEVICE (N/A) TRANSESOPHAGEAL ECHOCARDIOGRAM (TEE) (N/A) -Cv- co-ox down this AM, still on milrinone and levophed  ? Sepsis related RESP- VDRF, vent per CCM  Still with relatively high volume of drainage from pleural tubes- appears serous RENAL- creatinine stable ENDO- CBG well controlled ID- growing Candida multiple sites- on Diflucan and Zosyn  ID following Protein calorie malnutrition, severe, continue TF NEURO- status improved, severely deconditioned   LOS: 32 days    [name] 08/27/2019

## 2019-08-27 NOTE — Progress Notes (Signed)
[name]  Date of Admission:  07/26/2019      Total days of antibiotics 26  Day 6 zosyn  Day 4 fluconazole       ASSESSMENT: Todd [name] is a 64 y.o. male with complicated hospitalization following CABG that required [facility] ECMO>>Impella support following several arrests and open chest procedures at the bedside. He has been on long term carbapenem up until recently when we switched him to zosyn and had gallbladder drain placed for rising bilirubinemia/cholangitis --> no obstructing stones on HIDA, ?Acalculous cholecystitis.   No growth from aspirated bile from perc-chole drain. Kidney function has improved. Liver function including total bilirubin slowly trending in the right direction. Plan on continuing with zosyn with hopes to stop Friday to complete 7d following drain placement.   Found to be candidemic on 7/14 with C. Parapsilosis --> he has completed 4 days of antifungal coverage with fluconazole; preliminary clearing based on 7/17 repeat cultures. Probably unable to do so now but will need PICC removed once able to clear his infection.     PLAN: 1. Continue zosyn through Friday 7/23, tentatively  2. Continue Fluconazole  3. Follow micro from blood and bile cultures  4. Will need PICC line out once able   Principal Problem:   Candidemia ([place]) Active Problems:   Acute acalculous cholecystitis   CHF (congestive heart failure) (HCC)   Chronic pain syndrome   Mixed hyperlipidemia   Moderate recurrent major depression (HCC)   Hypothyroidism   Acute systolic heart failure (HCC)   Type 2 diabetes mellitus with hyperglycemia (HCC)   AKI (acute kidney injury) ([place])   Coronary artery disease involving native coronary artery of native heart with unstable angina pectoris (HCC)   Coronary artery disease   Malnutrition of moderate degree   Pressure injury of skin   Acute on chronic respiratory failure (HCC)   Acute respiratory failure (HCC)    S/P CABG (coronary artery bypass graft)    amiodarone  200 mg Per Tube BID   artificial tears   Both Eyes Q8H   aspirin  81 mg Per Tube Daily   chlorhexidine gluconate (MEDLINE KIT)  15 mL Mouth Rinse BID   Chlorhexidine Gluconate Cloth  6 each Topical Daily   collagenase   Topical Daily   docusate  200 mg Per Tube Daily   feeding supplement (PROSource TF)  45 mL Per Tube BID   insulin aspart  0-24 Units Subcutaneous Q4H   insulin aspart  5 Units Subcutaneous Q4H   insulin detemir  5 Units Subcutaneous BID   ipratropium  0.5 mg Nebulization TID BM   levalbuterol  1.25 mg Nebulization TID BM   mouth rinse  15 mL Mouth Rinse 10 times per day   midodrine  10 mg Per Tube TID WC   pantoprazole sodium  40 mg Per Tube Daily   potassium chloride  40 mEq Per Tube BID   sodium chloride flush  3 mL Intravenous Q12H   sodium chloride flush  5 mL Intracatheter Q8H    SUBJECTIVE: He does not verbalize much but is awake sitting upright in bed on trach collar. He makes eye contact and appears to be attempting to converse.   AF overnight and for several days now. WBC continues to increase a bit - up to 29.1  PICC line remains in LUE from > 2 weeks ago. He requires multiple vasoactive medications still.  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  Physical Exam Vitals and nursing note reviewed.  Constitutional:      Comments: Awake, making eye contact. No distress sitting upright in bed.   Neck:     Comments: Trach in place, clean and dry. On trach collar  Cardiovascular:     Rate and Rhythm: Normal rate and regular rhythm.  Pulmonary:     Effort: Tachypnea present. No accessory muscle usage or respiratory distress.     Breath sounds: Rhonchi present.  Chest:     Comments: Healing surgical incisions from CABG and ECMO/Impella cannulation  Abdominal:     Tenderness: Guarding: seems to be guarding.     Comments: Drain in place with yellow-bilious fluid   Skin:    Coloration: Skin is jaundiced.     Comments: LUE PICC in place      Lab Results Lab Results  Component Value Date   WBC 29.1 (H) 08/27/2019   HGB 8.5 (L) 08/27/2019   HCT 29.4 (L) 08/27/2019   MCV 98.3 08/27/2019   PLT 380 08/27/2019    Lab Results  Component Value Date   CREATININE 1.09 08/27/2019   BUN 46 (H) 08/27/2019   NA 143 08/27/2019   K 4.2 08/27/2019   CL 109 08/27/2019   CO2 25 08/27/2019    Lab Results  Component Value Date   ALT 139 (H) 08/27/2019   AST 129 (H) 08/27/2019   ALKPHOS 496 (H) 08/27/2019   BILITOT 10.2 (H) 08/27/2019     Microbiology: Recent Results (from the past 240 hour(s))  Aerobic/Anaerobic Culture (surgical/deep wound)     Status: None   Collection Time: 08/17/19  2:32 PM   Specimen: Wound  Result Value Ref Range Status   Specimen Description WOUND  Final   Special Requests NONE  Final   Gram Stain NO WBC SEEN NO ORGANISMS SEEN   Final   Culture   Final    No growth aerobically or anaerobically. Performed at [facility], [place] [address], [place], [place] [address]    Report Status 08/22/2019 FINAL  Final  Culture, fungus without smear     Status: Abnormal (Preliminary result)   Collection Time: 08/20/19  2:00 PM   Specimen: Bronchoalveolar Lavage; Other  Result Value Ref  Range Status   Specimen Description BRONCHIAL ALVEOLAR LAVAGE  Final   Special Requests   Final    Normal Performed at [facility], [place] [address], [place], [place] [address]    Culture CANDIDA ALBICANS (A)  Final   Report Status PENDING  Incomplete  Culture, bal-quantitative     Status: Abnormal   Collection Time: 08/20/19  2:00 PM   Specimen: Bronchoalveolar Lavage; Respiratory  Result Value Ref Range Status   Specimen Description BRONCHIAL ALVEOLAR LAVAGE  Final   Special Requests NONE  Final   Gram Stain   Final    FEW WBC PRESENT, PREDOMINANTLY PMN NO ORGANISMS SEEN Performed at [facility], [address] [place], [place] [address]    Culture (A)  Final    4,000 COLONIES/mL CANDIDA TROPICALIS 3,000 COLONIES/mL CANDIDA ALBICANS    Report Status 08/25/2019 FINAL  Final  Body fluid culture     Status: None   Collection Time: 08/22/19  9:14 AM   Specimen: Pleura; Body Fluid  Result Value Ref Range Status   Specimen Description PLEURAL  Final   Special Requests NONE  Final   Gram Stain   Final    NO WBC SEEN NO ORGANISMS SEEN Performed at [facility], [address] [place], [place] [address]    Culture RARE CANDIDA PARAPSILOSIS  Final   Report Status 08/25/2019 FINAL  Final  Culture, blood (routine x 2)     Status: Abnormal   Collection Time: 08/22/19 12:58 PM   Specimen: BLOOD RIGHT HAND  Result Value Ref Range Status   Specimen Description BLOOD RIGHT HAND  Final   Special Requests   Final    BOTTLES DRAWN AEROBIC ONLY Blood Culture adequate volume   Culture  Setup Time   Final    AEROBIC BOTTLE ONLY YEAST CRITICAL RESULT CALLED TO, READ BACK BY AND VERIFIED WITH: Karsten Ro [facility] 08/25/19 0123 JDW Performed at [facility], [place] [address], [place], [place] [address]    Culture CANDIDA PARAPSILOSIS (A)  Final   Report Status 08/26/2019 FINAL  Final  Culture, blood (routine x 2)     Status: None (Preliminary result)   Collection Time:  08/22/19 12:58 PM   Specimen: BLOOD RIGHT HAND  Result Value Ref Range Status   Specimen Description BLOOD RIGHT HAND  Final   Special Requests   Final    BOTTLES DRAWN AEROBIC ONLY Blood Culture adequate volume   Culture   Final    NO GROWTH 4 DAYS Performed at [facility], [place] [address], [place], [place] [address]    Report Status PENDING  Incomplete  Blood Culture ID Panel (Reflexed)     Status: Abnormal   Collection Time: 08/22/19 12:58 PM  Result Value Ref Range Status   Enterococcus species NOT DETECTED NOT DETECTED Final   Listeria monocytogenes NOT DETECTED NOT DETECTED Final   Staphylococcus species NOT DETECTED NOT DETECTED Final   Staphylococcus aureus (BCID) NOT DETECTED NOT DETECTED Final   Streptococcus species NOT DETECTED NOT DETECTED Final   Streptococcus agalactiae NOT DETECTED NOT DETECTED Final   Streptococcus pneumoniae NOT DETECTED NOT DETECTED Final   Streptococcus pyogenes NOT DETECTED NOT DETECTED Final   Acinetobacter baumannii NOT DETECTED NOT DETECTED Final   Enterobacteriaceae species NOT DETECTED NOT DETECTED Final   Enterobacter cloacae complex NOT DETECTED NOT DETECTED Final   Escherichia coli NOT DETECTED NOT DETECTED Final   Klebsiella oxytoca NOT DETECTED NOT DETECTED Final   Klebsiella pneumoniae NOT DETECTED NOT DETECTED Final   Proteus species NOT DETECTED NOT DETECTED Final   Serratia marcescens NOT DETECTED NOT DETECTED Final   Haemophilus influenzae NOT DETECTED NOT DETECTED Final   Neisseria meningitidis NOT DETECTED NOT DETECTED Final   Pseudomonas aeruginosa  NOT DETECTED NOT DETECTED Final   Candida albicans NOT DETECTED NOT DETECTED Final   Candida glabrata NOT DETECTED NOT DETECTED Final   Candida krusei NOT DETECTED NOT DETECTED Final   Candida parapsilosis DETECTED (A) NOT DETECTED Final    Comment: CRITICAL RESULT CALLED TO, READ BACK BY AND VERIFIED WITH: J LEDFORD PHARMD 08/25/19 0123 JDW    Candida tropicalis NOT  DETECTED NOT DETECTED Final    Comment: Performed at [facility], [address] [place], [place] [address]  Aerobic/Anaerobic Culture (surgical/deep wound)     Status: None (Preliminary result)   Collection Time: 08/24/19  1:35 PM   Specimen: Abscess  Result Value Ref Range Status   Specimen Description ABSCESS  Final   Special Requests DRAIN  Final   Gram Stain   Final    RARE WBC PRESENT, PREDOMINANTLY PMN NO ORGANISMS SEEN    Culture   Final    NO GROWTH 3 DAYS NO ANAEROBES ISOLATED; CULTURE IN PROGRESS FOR 5 DAYS Performed at [place] [address] [place], [place] [address]    Report Status PENDING  Incomplete  Culture, blood (routine x 2)     Status: None (Preliminary result)   Collection Time: 08/25/19  3:53 PM   Specimen: BLOOD RIGHT HAND  Result Value Ref Range Status   Specimen Description BLOOD RIGHT HAND  Final   Special Requests   Final    BOTTLES DRAWN AEROBIC AND ANAEROBIC Blood Culture adequate volume   Culture   Final    NO GROWTH < 24 HOURS Performed at [facility], [place] [address] [place], [place] [address]    Report Status PENDING  Incomplete  Culture, blood (routine x 2)     Status: None (Preliminary result)   Collection Time: 08/25/19  3:53 PM   Specimen: BLOOD RIGHT ARM  Result Value Ref Range Status   Specimen Description BLOOD RIGHT ARM  Final   Special Requests   Final    BOTTLES DRAWN AEROBIC ONLY Blood Culture adequate volume   Culture   Final    NO GROWTH < 24 HOURS Performed at [facility], [place] [address] [place], [place] [address]    Report Status PENDING  Incomplete     Janene Madeira, MSN, NP-C [facility] [email or web address] Pager: [phone] Office: [phone] Loop: [phone]

## 2019-08-27 NOTE — Progress Notes (Signed)
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+  Labs: Recent Labs    08/25/19 0302 08/25/19 0302 08/25/19 0555 08/25/19 0801 08/26/19 0228 08/26/19 1727 08/27/19 0218  HGB 8.1*   < >  --   --  8.7*  --  8.5*  HCT 28.8*  --   --   --  29.3*  --  29.4*  PLT 392  --   --   --  406*  --  380  APTT 143*   < > 80*  --  74*  --  72*  CREATININE 1.63*  --   --    < > 1.45* 1.18 1.09   < > = values in this interval not displayed.    Assessment: 64 yo male s/p ECMO and Impella. Chest closed 7/2. CT drainage decreased.    HIT ab sent a few days ago and returned as 2.6 - positive, switched from heparin to bivalirudin. SRA positive, confirmed for HIT.  APTT (72) remains therapeutic on bivalirudin 0.055 mg/kg/hr. Hgb stable 8.5, platelets stable 380. No overt bleeding noted.   Goals of Therapy: APTT 50-85 secs  Plan: Continue bivalirudin at 0.055 mg/kg/hr Monitor daily aPTT, CBC, and for s/sx of bleeding  Danae Orleans, PharmD, BCPS Phone [phone] 08/27/2019       7:16 AM  Please check [email or web address] for unit-specific pharmacist phone numbers

## 2019-08-27 NOTE — Progress Notes (Signed)
Referring Physician(s): Dr. Katrinka Blazing  Supervising Physician: Ruel Favors  Patient Status:  [facility] - In-pt  Chief Complaint: Acute cholecystitis, s/p percutaneous cholecystectomy by Dr. Grace Isaac on 08/24/2019.   Subjective:  No family at bedside. Patient with eyes open, nonverbal but nods appropriately. Patient nodded "yes" when asked if he was feeling better.   Patient currently managed by Critical Care, Heart Failure, Infectious Disease and Cardiothoracic Surgery teams. He is currently in the ICU due to cardiogenic/hemorrhagic shock. 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 He is trached/vented and requiring multiple pressors, antibiotics, etc.  Allergies: Empagliflozin, Heparin, Other, Simvastatin, and Sitagliptin  Medications: Prior to Admission medications   Medication Sig Start Date End Date Taking? Authorizing Provider  citalopram (CELEXA) 40 MG tablet Take 40 mg by mouth daily.   Yes [provider]  dapagliflozin propanediol (FARXIGA) 10 MG TABS tablet Take 10 mg by mouth daily.   Yes [provider]  ergocalciferol (VITAMIN D2) 1.25 MG (50000 UT) capsule Take 50,000 Units by mouth 2 (two) times a week.   Yes [provider]  furosemide (LASIX) 20 MG tablet Take 40 mg by mouth daily. 07/16/19  Yes [provider]  glipiZIDE-metformin (METAGLIP) 2.5-500 MG tablet Take 2 tablets by mouth 2 (two) times daily with a meal.   Yes [provider]  ibuprofen (ADVIL) 200 MG tablet Take 400 mg by mouth 2 (two) times daily as needed for moderate pain.   Yes [provider]  levofloxacin (LEVAQUIN) 750 MG tablet Take 750 mg by mouth daily. For 14 days.   Yes [provider]  lisinopril (ZESTRIL) 20 MG tablet Take 20 mg by mouth daily.   Yes [provider]  oxyCODONE (ROXICODONE) 15 MG immediate release tablet Take 15 mg by mouth 3 (three) times daily.    Yes [provider]  pioglitazone (ACTOS) 45 MG tablet Take 45 mg by mouth daily.   Yes [provider]  promethazine (PHENERGAN) 25 MG tablet Take 25 mg by mouth 2 (two) times daily as needed for nausea or vomiting.   Yes [provider]     Vital Signs: BP (!) 109/41 (BP Location: Right Leg)    Pulse 90    Temp 99.5 F (37.5 C)    Resp (!) 24    Ht 5\' 9"  (1.753 m)    Wt 149 lb 11.1 oz (67.9 kg)    SpO2 97%    BMI 22.11 kg/m   Physical Exam Constitutional:      Appearance: He is ill-appearing.  Cardiovascular:     Rate and Rhythm: Normal rate. Rhythm irregular.  Pulmonary:     Comments: Trach/vent Abdominal:     Comments: RUQ drain in place. Approximately 20 cc bilious output in gravity bag. Skin insertion site is clean and dry, sutures intact. No erythema or drainage observed. 5 cc NS flushed. Easily flushed and aspirated.   Skin:    General: Skin is warm.  Neurological:     Mental Status: He is alert.     Imaging: CT ABDOMEN W CONTRAST  Result Date: 08/23/2019 CLINICAL DATA:  Cholecystitis. Fever and leukocytosis. Elevated bilirubin. EXAM: CT ABDOMEN WITH CONTRAST TECHNIQUE: Multidetector CT imaging of the abdomen was performed using the standard protocol following bolus administration of intravenous contrast. CONTRAST:  79mL OMNIPAQUE IOHEXOL 300 MG/ML  SOLN COMPARISON:  07/30/2014 from [facility] FINDINGS: Lower chest: Small bilateral pleural effusions and  dependent bibasilar atelectasis. Hepatobiliary: No hepatic masses identified. High attenuation sludge is seen in the gallbladder, however there are no findings of acute cholecystitis or biliary ductal dilatation. Pancreas:  No mass or inflammatory changes. Spleen:  Within normal limits in size and appearance. Adrenals/Urinary Tract: 1.4 cm left adrenal mass is seen which is nonspecific, but most likely represents a benign adenoma in patient without history of cancer. Moderate left renal atrophy  is seen. No evidence of renal masses or hydronephrosis. Stomach/Bowel: A feeding tube is seen with tip in the proximal jejunum. No evidence of dilated bowel loops. Mild diffuse mesenteric edema and ascites is seen, but there is no evidence of focal inflammatory process or abscess. Vascular/Lymphatic: No pathologically enlarged lymph nodes identified. No abdominal aortic aneurysm. Aortic atherosclerosis noted. Other:  None. Musculoskeletal:  No suspicious bone lesions identified. IMPRESSION: Gallbladder sludge. No radiographic evidence of acute cholecystitis or biliary ductal dilatation. Mild ascites and diffuse mesenteric edema. No evidence of focal inflammatory process or abscess. Small bilateral pleural effusions and dependent bibasilar atelectasis. Small indeterminate left adrenal mass. If patient has no history of malignancy, this is probably a benign adenoma and follow-up by CT is recommended in 12 months. If patient does have cancer history, adrenal protocol abdomen CT without and with contrast is recommended now for further characterization. This recommendation follows ACR consensus guidelines: Management of Incidental Adrenal Masses: A White Paper of the ACR Incidental Findings Committee. J Am Coll Radiol 2017;14:1038-1044. Aortic Atherosclerosis (ICD10-I70.0). Electronically Signed   By: Danae Orleans M.D.   On: 08/23/2019 17:54   DG Chest Port 1 View  Result Date: 08/27/2019 CLINICAL DATA:  Respiratory failure EXAM: PORTABLE CHEST 1 VIEW COMPARISON:  08/25/2019 FINDINGS: Tracheostomy tube in satisfactory positioning. Enteric tube courses below the diaphragm with distal tip beyond the inferior margin of the film. Bilateral chest tubes remain in position. Left-sided PICC line terminates at the level of the superior cavoatrial junction. Post CABG changes. Stable cardiomediastinal contours. Diffuse interstitial prominence, left slightly greater than right, is similar in appearance to prior. Probable small  left pleural effusion. No pneumothorax. IMPRESSION: 1. No significant interval change from prior. 2. Diffuse interstitial prominence, left slightly greater than right. Probable small left pleural effusion. 3. Support lines and tubes, as above. Electronically Signed   By: Duanne Guess D.O.   On: 08/27/2019 08:05   DG Chest Port 1 View  Result Date: 08/25/2019 CLINICAL DATA:  Status post CABG. EXAM: PORTABLE CHEST 1 VIEW COMPARISON:  08/20/1999 FINDINGS: Tracheostomy tube in satisfactory position. Feeding tube extending into the stomach. Stable bilateral chest tubes. No pneumothorax. Left PICC tip approximately 3 cm inferior to the superior cavoatrial junction. Mildly improved dense left lower lobe opacification. Small bilateral pleural effusions, improved on the right. Stable prominence of the interstitial markings. Stable post CABG changes. No acute bony abnormality. IMPRESSION: 1. Mildly improved dense left lower lobe atelectasis or pneumonia. 2. Small bilateral pleural effusions, improved on the right. 3. Stable probable combination of chronic interstitial lung disease and interstitial pulmonary edema. Electronically Signed   By: Beckie Salts M.D.   On: 08/25/2019 10:51   CT PERC CHOLECYSTOSTOMY  Result Date: 08/24/2019 INDICATION: Acute cholecystitis. Poor operative candidate. Please perform image guided placement cholecystostomy tube for infection source control purposes As the interventional radiology suite is currently being utilized for a complex endoleak procedure, we will proceed with placement of the cholecystostomy tube with ultrasound and CT guidance to facilitate drainage. EXAM: ULTRASOUND AND FLUOROSCOPIC-GUIDED CHOLECYSTOSTOMY TUBE PLACEMENT COMPARISON:  CT abdomen pelvis-08/23/2019; nuclear medicine HIDA scan-08/23/2018 MEDICATIONS: The patient is currently admitted to the hospital and on intravenous antibiotics. Antibiotics were administered within an appropriate time frame prior to skin  puncture. ANESTHESIA/SEDATION: Moderate (conscious) sedation was employed during this procedure. A total of Versed 0.5 mg and Fentanyl 12.5 mcg was administered intravenously. Moderate Sedation Time: 20 minutes. The patient's level of consciousness and vital signs were monitored continuously by radiology nursing throughout the procedure under my direct supervision. CONTRAST:  None FLUOROSCOPY TIME:  Not provided COMPLICATIONS: None immediate. PROCEDURE: Informed written consent was obtained from the patient's family after a discussion of the risks, benefits and alternatives to treatment. Questions regarding the procedure were encouraged and answered. A timeout was performed prior to the initiation of the procedure. The right upper abdominal quadrant was prepped and draped in the usual sterile fashion, and a sterile drape was applied covering the operative field. Maximum barrier sterile technique with sterile gowns and gloves were used for the procedure. A timeout was performed prior to the initiation of the procedure. Local anesthesia was provided with 1% lidocaine with epinephrine. Ultrasound scanning of the right upper quadrant demonstrates a moderately dilated gallbladder with minimal amount of gallbladder wall thickening, however this is nonspecific finding in the setting known trace intra-abdominal ascites. Utilizing a transhepatic approach, an 18 gauge needle was advanced into the gallbladder under direct ultrasound guidance. An ultrasound image was saved for documentation purposes. Short Amplatz wire was coiled in gallbladder lumen. Appropriate positioning was confirmed with CT imaging. Next, the track was serially dilated allowing placement of a 10.2-French Cook cholecystomy tube into the gallbladder lumen where it was coiled and locked. Appropriate positioning was confirmed with CT imaging. Bile was aspirated and a small amount of aspirated bile was capped and sent to the laboratory for analysis. The  catheter was secured to the skin with an interrupted suture, connected to a drainage bag and a dressing was applied. The patient tolerated the procedure well without immediate post procedural complication. IMPRESSION: Successful ultrasound and CT guided placement of a 10.2 French cholecystostomy tube. A small amount of aspirated bile was capped and sent to the laboratory for analysis Electronically Signed   By: Simonne Come M.D.   On: 08/24/2019 14:18    Labs:  CBC: Recent Labs    08/24/19 2043 08/25/19 0302 08/26/19 0228 08/27/19 0218  WBC 18.7* 19.4* 24.7* 29.1*  HGB 8.6* 8.1* 8.7* 8.5*  HCT 29.8* 28.8* 29.3* 29.4*  PLT 429* 392 406* 380    COAGS: Recent Labs    08/06/19 0340 08/06/19 0340 08/07/19 0347 08/07/19 1442 08/08/19 0302 08/20/19 1202 08/21/19 0406 08/25/19 0302 08/25/19 0555 08/26/19 0228 08/27/19 0218  INR 1.2  --  1.3* 1.2  --  1.6*  --   --   --   --   --   APTT 94*   < > 44*  --    < >  --    < > 143* 80* 74* 72*   < > = values in this interval not displayed.    BMP: Recent Labs    08/26/19 0032 08/26/19 0228 08/26/19 1727 08/27/19 0218  NA 147* 148* 146* 143  K 4.6 3.8 3.9 4.2  CL 109 109 111 109  CO2 28 29 26 25   GLUCOSE 234* 170* 185* 114*  BUN 65* 65* 51* 46*  CALCIUM 8.3* 8.2* 8.3* 8.1*  CREATININE 1.42* 1.45* 1.18 1.09  GFRNONAA 51* 50* >60 >60  GFRAA 60* 58* >60 >  60    LIVER FUNCTION TESTS: Recent Labs    08/24/19 0335 08/24/19 1459 08/25/19 0302 08/25/19 1538 08/25/19 1553 08/26/19 0228 08/26/19 1727 08/27/19 0218  BILITOT 13.6*  --  12.1*  --   --  12.0*  --  10.2*  AST 239*  --  183*  --   --  166*  --  129*  ALT 197*  --  181*  --   --  163*  --  139*  ALKPHOS 554*  --  499*  --   --  576*  --  496*  PROT 6.3*  --  6.7  --   --  6.1*  --  5.7*  ALBUMIN 2.9*   < > 2.7*   < > 2.6* 2.3* 2.2* 2.1*   < > = values in this interval not displayed.    Assessment and Plan:  Acute cholecystitis, s/p percutaneous  cholecystectomy by Dr. Grace Isaac on 08/24/2019.   40 cc output documented on flow sheet with another 20 cc in the gravity bag. WBC trending up (29.1 today). Bilirubin and LFTs trending down.  Continue to flush drain and monitor output per orders. Change dressing to site as needed.   IR will continue to follow. Please call IR with any questions/concerns.   Electronically Signed: Alwyn Ren, AGACNP-BC [phone] 08/27/2019, 3:35 PM   I spent a total of 15 Minutes at the the patient's bedside AND on the patient's hospital floor or unit, greater than 50% of which was counseling/coordinating care for percutaneous cholecystostomy drain.

## 2019-08-27 NOTE — Progress Notes (Signed)
CT surgery p.m. Rounds  Patient resting comfortably on trach collar Blood pressure stable Minimal bilious output from cholecystostomy tube

## 2019-08-28 ENCOUNTER — Inpatient Hospital Stay (HOSPITAL_COMMUNITY): Payer: Medicare Other

## 2019-08-28 DIAGNOSIS — J849 Interstitial pulmonary disease, unspecified: Secondary | ICD-10-CM

## 2019-08-28 DIAGNOSIS — B377 Candidal sepsis: Secondary | ICD-10-CM

## 2019-08-28 DIAGNOSIS — Z9281 Personal history of extracorporeal membrane oxygenation (ECMO): Secondary | ICD-10-CM

## 2019-08-28 LAB — COOXEMETRY PANEL
Carboxyhemoglobin: 2.2 % — ABNORMAL HIGH (ref 0.5–1.5)
Methemoglobin: 0.7 % (ref 0.0–1.5)
O2 Saturation: 57.8 %
Total hemoglobin: 10.9 g/dL — ABNORMAL LOW (ref 12.0–16.0)

## 2019-08-28 LAB — GLUCOSE, CAPILLARY
Glucose-Capillary: 143 mg/dL — ABNORMAL HIGH (ref 70–99)
Glucose-Capillary: 149 mg/dL — ABNORMAL HIGH (ref 70–99)
Glucose-Capillary: 152 mg/dL — ABNORMAL HIGH (ref 70–99)
Glucose-Capillary: 156 mg/dL — ABNORMAL HIGH (ref 70–99)
Glucose-Capillary: 202 mg/dL — ABNORMAL HIGH (ref 70–99)
Glucose-Capillary: 81 mg/dL (ref 70–99)

## 2019-08-28 LAB — RENAL FUNCTION PANEL
Albumin: 2 g/dL — ABNORMAL LOW (ref 3.5–5.0)
Anion gap: 8 (ref 5–15)
BUN: 43 mg/dL — ABNORMAL HIGH (ref 8–23)
CO2: 25 mmol/L (ref 22–32)
Calcium: 7.8 mg/dL — ABNORMAL LOW (ref 8.9–10.3)
Chloride: 115 mmol/L — ABNORMAL HIGH (ref 98–111)
Creatinine, Ser: 1.13 mg/dL (ref 0.61–1.24)
GFR calc Af Amer: >60 mL/min (ref >60)
GFR calc non Af Amer: >60 mL/min (ref >60)
Glucose, Bld: 193 mg/dL — ABNORMAL HIGH (ref 70–99)
Phosphorus: 3.8 mg/dL (ref 2.5–4.6)
Potassium: 3.9 mmol/L (ref 3.5–5.1)
Sodium: 148 mmol/L — ABNORMAL HIGH (ref 135–145)

## 2019-08-28 LAB — CBC
HCT: 26 % — ABNORMAL LOW (ref 39.0–52.0)
Hemoglobin: 7.4 g/dL — ABNORMAL LOW (ref 13.0–17.0)
MCH: 29.1 pg (ref 26.0–34.0)
MCHC: 28.5 g/dL — ABNORMAL LOW (ref 30.0–36.0)
MCV: 102.4 fL — ABNORMAL HIGH (ref 80.0–100.0)
Platelets: 362 10*3/uL (ref 150–400)
RBC: 2.54 MIL/uL — ABNORMAL LOW (ref 4.22–5.81)
RDW: 24.5 % — ABNORMAL HIGH (ref 11.5–15.5)
WBC: 24.2 10*3/uL — ABNORMAL HIGH (ref 4.0–10.5)
nRBC: 0.3 % — ABNORMAL HIGH (ref 0.0–0.2)

## 2019-08-28 LAB — COMPREHENSIVE METABOLIC PANEL
ALT: 107 U/L — ABNORMAL HIGH (ref 0–44)
AST: 96 U/L — ABNORMAL HIGH (ref 15–41)
Albumin: 1.9 g/dL — ABNORMAL LOW (ref 3.5–5.0)
Alkaline Phosphatase: 395 U/L — ABNORMAL HIGH (ref 38–126)
Anion gap: 12 (ref 5–15)
BUN: 40 mg/dL — ABNORMAL HIGH (ref 8–23)
CO2: 24 mmol/L (ref 22–32)
Calcium: 7.6 mg/dL — ABNORMAL LOW (ref 8.9–10.3)
Chloride: 113 mmol/L — ABNORMAL HIGH (ref 98–111)
Creatinine, Ser: 1.08 mg/dL (ref 0.61–1.24)
GFR calc Af Amer: >60 mL/min (ref >60)
GFR calc non Af Amer: >60 mL/min (ref >60)
Glucose, Bld: 77 mg/dL (ref 70–99)
Potassium: 4 mmol/L (ref 3.5–5.1)
Sodium: 149 mmol/L — ABNORMAL HIGH (ref 135–145)
Total Bilirubin: 8.7 mg/dL — ABNORMAL HIGH (ref 0.3–1.2)
Total Protein: 6.8 g/dL (ref 6.5–8.1)

## 2019-08-28 LAB — APTT
aPTT: 120 seconds — ABNORMAL HIGH (ref 24–36)
aPTT: 76 seconds — ABNORMAL HIGH (ref 24–36)

## 2019-08-28 MED ORDER — MIDODRINE HCL 5 MG PO TABS
15.0000 mg | ORAL_TABLET | Freq: Three times a day (TID) | ORAL | Status: DC
  Administered 2019-08-28 – 2019-09-05 (×22): 15 mg
  Filled 2019-08-28 (×23): qty 3

## 2019-08-28 MED ORDER — IOHEXOL 300 MG/ML  SOLN
100.0000 mL | Freq: Once | INTRAMUSCULAR | Status: AC | PRN
  Administered 2019-08-28: 100 mL via INTRAVENOUS

## 2019-08-28 MED ORDER — PIVOT 1.5 CAL PO LIQD
1000.0000 mL | ORAL | Status: DC
  Administered 2019-08-28 – 2019-09-11 (×11): 1000 mL
  Filled 2019-08-28 (×23): qty 1000

## 2019-08-28 MED ORDER — FUROSEMIDE 10 MG/ML IJ SOLN
40.0000 mg | Freq: Once | INTRAMUSCULAR | Status: AC
  Administered 2019-08-28: 40 mg via INTRAVENOUS
  Filled 2019-08-28: qty 4

## 2019-08-28 MED ORDER — JUVEN PO PACK
1.0000 | PACK | Freq: Two times a day (BID) | ORAL | Status: DC
  Administered 2019-08-28 – 2019-09-05 (×13): 1
  Filled 2019-08-28 (×14): qty 1

## 2019-08-28 MED ORDER — ACETAMINOPHEN 325 MG PO TABS
650.0000 mg | ORAL_TABLET | Freq: Four times a day (QID) | ORAL | Status: DC | PRN
  Administered 2019-08-29 – 2019-08-30 (×4): 650 mg via ORAL
  Filled 2019-08-28 (×5): qty 2

## 2019-08-28 MED ORDER — CHOLESTYRAMINE 4 G PO PACK
4.0000 g | PACK | Freq: Two times a day (BID) | ORAL | Status: DC
  Administered 2019-08-28 – 2019-09-10 (×26): 4 g via ORAL
  Filled 2019-08-28 (×30): qty 1

## 2019-08-28 MED ORDER — PROSOURCE TF PO LIQD
45.0000 mL | Freq: Every day | ORAL | Status: DC
  Administered 2019-08-29 – 2019-09-11 (×13): 45 mL
  Filled 2019-08-28 (×14): qty 45

## 2019-08-28 MED ORDER — FREE WATER
200.0000 mL | Status: DC
  Administered 2019-08-28 – 2019-08-29 (×7): 200 mL

## 2019-08-28 NOTE — Progress Notes (Signed)
Pt. Transferred to CT and back to 2H rm 2. No complications, pt. Suction prior to leaving. Pt. Returned back to 40%

## 2019-08-28 NOTE — Progress Notes (Signed)
Patient ID: Todd [name], male   DOB: May 07, 1955, 64 y.o.   MRN: [id number] EVENING ROUNDS NOTE :     [address]       [place] [address]             [phone]                 11 Days Post-Op Procedure(s) (LRB): REMOVAL OF IMPELLA LEFT VENTRICULAR ASSIST DEVICE (N/A) TRANSESOPHAGEAL ECHOCARDIOGRAM (TEE) (N/A)  Total Length of Stay:  LOS: 33 days  BP (!) 116/103   Pulse 100   Temp (!) 101.5 F (38.6 C) (Core)   Resp (!) 30   Ht 5\' 9"  (1.753 m)   Wt 70.4 kg   SpO2 100%   BMI 22.92 kg/m   .Intake/Output      07/19 0701 - 07/20 0700 07/20 0701 - 07/21 0700   I.V. (mL/kg) 721 (10.2) 345.9 (4.9)   NG/GT 150    IV Piggyback 332.9 73.7   Total Intake(mL/kg) 1203.9 (17.1) 419.6 (6)   Urine (mL/kg/hr) 1555 (0.9) 1600 (2.1)   Drains 85 15   Stool 575 275   Chest Tube 640 500   Total Output 2855 2390   Net -1651.1 -1970.4          . sodium chloride 250 mL (08/13/19 1412)  . sodium chloride Stopped (08/13/19 1631)  . bivalirudin (ANGIOMAX) infusion 0.5 mg/mL (Non-ACS indications) 0.055 mg/kg/hr (08/28/19 1700)  . feeding supplement (PIVOT 1.5 CAL) 65 mL/hr at 08/28/19 1628  . fluconazole (DIFLUCAN) IV Stopped (08/28/19 0418)  . lactated ringers 20 mL/hr at 08/10/19 1705  . lactated ringers 10 mL/hr at 08/18/19 0054  . milrinone 0.125 mcg/kg/min (08/28/19 1700)  . norepinephrine (LEVOPHED) Adult infusion 30 mcg/min (08/28/19 1700)  . piperacillin-tazobactam (ZOSYN)  IV 12.5 mL/hr at 08/28/19 1700     Lab Results  Component Value Date   WBC 24.2 (H) 08/28/2019   HGB 7.4 (L) 08/28/2019   HCT 26.0 (L) 08/28/2019   PLT 362 08/28/2019   GLUCOSE 77 08/28/2019   CHOL 203 (H) 07/27/2019   TRIG 213 (H) 08/27/2019   HDL 34 (L) 07/27/2019   LDLCALC 148 (H) 07/27/2019   ALT 107 (H) 08/28/2019   AST 96 (H) 08/28/2019   NA 149 (H) 08/28/2019   K 4.0 08/28/2019   CL 113 (H) 08/28/2019   CREATININE 1.08 08/28/2019   BUN 40 (H) 08/28/2019   CO2 24  08/28/2019   TSH 2.243 07/31/2019   INR 1.6 (H) 08/20/2019   HGBA1C 9.0 (H) 07/26/2019   Remains on vent support   07/28/2019 MD  Beeper [id number] Office [phone] 08/28/2019 5:39 PM

## 2019-08-28 NOTE — Progress Notes (Signed)
Subjective:  Patient confused  Antibiotics:  Anti-infectives (From admission, onward)   Start     Dose/Rate Route Frequency Ordered Stop   08/25/19 0200  fluconazole (DIFLUCAN) IVPB 400 mg     Discontinue     400 mg 100 mL/hr over 120 Minutes Intravenous Every 24 hours 08/25/19 0146     08/22/19 1400  piperacillin-tazobactam (ZOSYN) IVPB 3.375 g     Discontinue     3.375 g 12.5 mL/hr over 240 Minutes Intravenous Every 8 hours 08/22/19 1032     08/20/19 2200  vancomycin (VANCOREADY) IVPB 750 mg/150 mL  Status:  Discontinued        750 mg 150 mL/hr over 60 Minutes Intravenous Every 24 hours 08/19/19 1623 08/20/19 0758   08/20/19 0900  vancomycin (VANCOREADY) IVPB 750 mg/150 mL  Status:  Discontinued        750 mg 150 mL/hr over 60 Minutes Intravenous Every 24 hours 08/20/19 0758 08/21/19 0852   08/16/19 1400  vancomycin (VANCOCIN) IVPB 1000 mg/200 mL premix  Status:  Discontinued        1,000 mg 200 mL/hr over 60 Minutes Intravenous Every 24 hours 08/16/19 1108 08/19/19 1623   08/16/19 0745  anidulafungin ([name]) 100 mg in sodium chloride 0.9 % 100 mL IVPB  Status:  Discontinued       "Followed by" Linked Group Details   100 mg 78 mL/hr over 100 Minutes Intravenous Every 24 hours 08/15/19 0741 08/22/19 1032   08/15/19 0730  anidulafungin ([name]) 200 mg in sodium chloride 0.9 % 200 mL IVPB       "Followed by" Linked Group Details   200 mg 78 mL/hr over 200 Minutes Intravenous  Once 08/15/19 0741 08/15/19 1346   08/13/19 1400  meropenem (MERREM) 1 g in sodium chloride 0.9 % 100 mL IVPB  Status:  Discontinued        1 g 200 mL/hr over 30 Minutes Intravenous Every 8 hours 08/13/19 1236 08/22/19 1032   08/13/19 1400  vancomycin (VANCOREADY) IVPB 1500 mg/300 mL  Status:  Discontinued        1,500 mg 150 mL/hr over 120 Minutes Intravenous Every 24 hours 08/13/19 1244 08/16/19 1108   08/10/19 1504  vancomycin (VANCOCIN) 1,000 mg in sodium chloride 0.9 % 1,000 mL irrigation   Status:  Discontinued          As needed 08/10/19 1505 08/10/19 1840   08/10/19 1500  vancomycin (VANCOCIN) 1,000 mg in sodium chloride 0.9 % 1,000 mL irrigation  Status:  Discontinued         Irrigation To Surgery 08/10/19 1448 08/10/19 1453   08/10/19 1500  vancomycin (VANCOCIN) 1,000 mg in sodium chloride 0.9 % 1,000 mL irrigation  Status:  Discontinued         Irrigation To Surgery 08/10/19 1454 08/10/19 1855   08/10/19 0800  vancomycin (VANCOREADY) IVPB 1750 mg/350 mL        1,750 mg 175 mL/hr over 120 Minutes Intravenous Every 36 hours 08/09/19 1426 08/11/19 2356   08/08/19 2232  vancomycin variable dose per unstable renal function (pharmacist dosing)  Status:  Discontinued         Does not apply See admin instructions 08/08/19 2232 08/09/19 1426   08/06/19 1827  vancomycin (VANCOCIN) powder  Status:  Discontinued          As needed 08/06/19 1828 08/06/19 1936   08/06/19 1503  vancomycin (VANCOCIN) 1,000 mg in sodium chloride 0.9 %  1,000 mL irrigation  Status:  Discontinued          As needed 08/06/19 1504 08/06/19 1936   08/04/19 2100  vancomycin (VANCOCIN) IVPB 1000 mg/200 mL premix  Status:  Discontinued        1,000 mg 200 mL/hr over 60 Minutes Intravenous Every 12 hours 08/04/19 0721 08/08/19 2225   08/04/19 0730  vancomycin (VANCOREADY) IVPB 2000 mg/400 mL        2,000 mg 200 mL/hr over 120 Minutes Intravenous  Once 08/04/19 0721 08/04/19 1003   08/04/19 0730  meropenem (MERREM) 1 g in sodium chloride 0.9 % 100 mL IVPB        1 g 200 mL/hr over 30 Minutes Intravenous Every 8 hours 08/04/19 0721 08/12/19 2220   08/02/19 2030  vancomycin (VANCOCIN) IVPB 1000 mg/200 mL premix        1,000 mg 200 mL/hr over 60 Minutes Intravenous  Once 08/02/19 1516 08/02/19 2151   08/02/19 1530  cefUROXime (ZINACEF) 1.5 g in sodium chloride 0.9 % 100 mL IVPB        1.5 g 200 mL/hr over 30 Minutes Intravenous Every 12 hours 08/02/19 1516 08/04/19 0430   08/02/19 0400  vancomycin (VANCOREADY)  IVPB 1250 mg/250 mL        1,250 mg 166.7 mL/hr over 90 Minutes Intravenous To Surgery 08/01/19 1422 08/02/19 0923   08/02/19 0400  cefUROXime (ZINACEF) 1.5 g in sodium chloride 0.9 % 100 mL IVPB        1.5 g 200 mL/hr over 30 Minutes Intravenous To Surgery 08/01/19 1422 08/02/19 0853   08/02/19 0400  cefUROXime (ZINACEF) 750 mg in sodium chloride 0.9 % 100 mL IVPB        750 mg 200 mL/hr over 30 Minutes Intravenous To Surgery 08/01/19 1422 08/02/19 1430   08/01/19 0400  vancomycin (VANCOREADY) IVPB 1250 mg/250 mL  Status:  Discontinued        1,250 mg 166.7 mL/hr over 90 Minutes Intravenous To Surgery 07/31/19 1034 08/01/19 1421   08/01/19 0400  cefUROXime (ZINACEF) 1.5 g in sodium chloride 0.9 % 100 mL IVPB  Status:  Discontinued        1.5 g 200 mL/hr over 30 Minutes Intravenous To Surgery 07/31/19 1034 08/01/19 1421   08/01/19 0400  cefUROXime (ZINACEF) 750 mg in sodium chloride 0.9 % 100 mL IVPB  Status:  Discontinued        750 mg 200 mL/hr over 30 Minutes Intravenous To Surgery 07/31/19 1034 08/01/19 1421      Medications: Scheduled Meds: . amiodarone  200 mg Per Tube BID  . aspirin  81 mg Per Tube Daily  . chlorhexidine gluconate (MEDLINE KIT)  15 mL Mouth Rinse BID  . Chlorhexidine Gluconate Cloth  6 each Topical Daily  . cholestyramine  4 g Oral BID  . collagenase   Topical Daily  . docusate  200 mg Per Tube Daily  . feeding supplement (PROSource TF)  45 mL Per Tube BID  . free water  200 mL Per Tube Q4H  . insulin aspart  0-24 Units Subcutaneous Q4H  . insulin aspart  5 Units Subcutaneous Q4H  . insulin detemir  5 Units Subcutaneous BID  . ipratropium  0.5 mg Nebulization TID BM  . levalbuterol  1.25 mg Nebulization TID BM  . mouth rinse  15 mL Mouth Rinse 10 times per day  . midodrine  15 mg Per Tube TID WC  . pantoprazole sodium  40 mg  Per Tube Daily  . potassium chloride  40 mEq Per Tube BID  . sodium chloride flush  3 mL Intravenous Q12H  . sodium chloride  flush  5 mL Intracatheter Q8H   Continuous Infusions: . sodium chloride 250 mL (08/13/19 1412)  . sodium chloride Stopped (08/13/19 1631)  . bivalirudin (ANGIOMAX) infusion 0.5 mg/mL (Non-ACS indications) 0.055 mg/kg/hr (08/28/19 1300)  . feeding supplement (PIVOT 1.5 CAL) 1,000 mL (08/27/19 2345)  . fluconazole (DIFLUCAN) IV Stopped (08/28/19 0418)  . lactated ringers 20 mL/hr at 08/10/19 1705  . lactated ringers 10 mL/hr at 08/18/19 0054  . milrinone 0.125 mcg/kg/min (08/28/19 1300)  . norepinephrine (LEVOPHED) Adult infusion 20 mcg/min (08/28/19 1300)  . piperacillin-tazobactam (ZOSYN)  IV 3.375 g (08/28/19 1408)   PRN Meds:.sodium chloride, sodium chloride, dextrose, fentaNYL (SUBLIMAZE) injection, metoprolol tartrate, ondansetron (ZOFRAN) IV, sodium chloride flush    Objective: Weight change: 2.5 kg  Intake/Output Summary (Last 24 hours) at 08/28/2019 1423 Last data filed at 08/28/2019 1300 Gross per 24 hour  Intake 1187.39 ml  Output 3130 ml  Net -1942.61 ml   Blood pressure (!) 116/103, pulse 88, temperature 98.1 F (36.7 C), resp. rate (!) 25, height _0  (1.753 m), weight 70.4 kg, SpO2 100 %. Temp:  [97.9 F (36.6 C)-98.6 F (37 C)] 98.1 F (36.7 C) (07/20 1120) Pulse Rate:  [80-94] 88 (07/20 1400) Resp:  [15-27] 25 (07/20 1400) BP: (51-160)/(28-134) 116/103 (07/20 1300) SpO2:  [97 %-100 %] 100 % (07/20 1400) Arterial Line BP: (94-117)/(39-51) 105/42 (07/20 1400) FiO2 (%):  [40 %] 40 % (07/20 1346) Weight:  [70.4 kg] 70.4 kg (07/20 0210)  Physical Exam: General:confused HEENT: icteric sclera, EOMI CVS regular rate,  Chest: , Rhonchi Abdomen: Biliary drain with bilious material Extremities: Jaundiced  Neuro: nonfocal  CBC:    BMET Recent Labs    08/27/19 1600 08/28/19 0234  NA 144 149*  K 4.0 4.0  CL 112* 113*  CO2 24 24  GLUCOSE 220* 77  BUN 43* 40*  CREATININE 1.08 1.08  CALCIUM 8.1* 7.6*     Liver Panel  Recent Labs    08/27/19 0218  08/27/19 0218 08/27/19 1600 08/27/19 1651 08/28/19 0234  PROT 5.7*  --   --   --  6.8  ALBUMIN 2.1*   < > 2.2*  --  1.9*  AST 129*  --   --   --  96*  ALT 139*  --   --   --  107*  ALKPHOS 496*  --   --   --  395*  BILITOT 10.2*  --   --   --  8.7*  BILIDIR  --   --   --  6.4*  --    < > = values in this interval not displayed.       Sedimentation Rate No results for input(s): ESRSEDRATE in the last 72 hours. C-Reactive Protein No results for input(s): CRP in the last 72 hours.  Micro Results: Recent Results (from the past 720 hour(s))  Surgical pcr screen     Status: None   Collection Time: 08/01/19 10:07 PM   Specimen: Nasal Mucosa; Nasal Swab  Result Value Ref Range Status   MRSA, PCR NEGATIVE NEGATIVE Final   Staphylococcus aureus NEGATIVE NEGATIVE Final    Comment: (NOTE) The Xpert SA Assay (FDA approved for NASAL specimens in patients 37 years of age and older), is one component of a comprehensive surveillance program. It is not intended to diagnose  infection nor to guide or monitor treatment. Performed at [facility], [place] [address], [place], [place] [address]   Culture, respiratory (non-expectorated)     Status: None   Collection Time: 08/11/19  3:32 PM   Specimen: Tracheal Aspirate; Respiratory  Result Value Ref Range Status   Specimen Description TRACHEAL ASPIRATE  Final   Special Requests Normal  Final   Gram Stain   Final    NO WBC SEEN NO SQUAMOUS EPITHELIAL CELLS SEEN RARE BUDDING YEAST SEEN Performed at [facility], [address], [place], [place] [address]    Culture FEW CANDIDA TROPICALIS  Final   Report Status 08/13/2019 FINAL  Final  Culture, blood (Routine X 2) w Reflex to ID Panel     Status: None   Collection Time: 08/13/19  8:27 AM   Specimen: BLOOD  Result Value Ref Range Status   Specimen Description BLOOD BLOOD LEFT ARM  Final   Special Requests   Final    BOTTLES DRAWN AEROBIC AND ANAEROBIC Blood Culture adequate  volume   Culture   Final    NO GROWTH 5 DAYS Performed at [facility], [place] [address], [place], [place] [address]    Report Status 08/18/2019 FINAL  Final  Culture, blood (Routine X 2) w Reflex to ID Panel     Status: None   Collection Time: 08/13/19  8:31 AM   Specimen: BLOOD  Result Value Ref Range Status   Specimen Description BLOOD BLOOD LEFT HAND  Final   Special Requests   Final    BOTTLES DRAWN AEROBIC AND ANAEROBIC Blood Culture adequate volume   Culture   Final    NO GROWTH 5 DAYS Performed at [facility], [place] [address], [place], [place] [address]    Report Status 08/18/2019 FINAL  Final  Culture, respiratory (non-expectorated)     Status: None   Collection Time: 08/13/19  4:12 PM   Specimen: Tracheal Aspirate; Respiratory  Result Value Ref Range Status   Specimen Description TRACHEAL ASPIRATE  Final   Special Requests NONE  Final   Gram Stain   Final    NO WBC SEEN FEW YEAST Performed at [facility], [place] [address], [place], [place] [address]    Culture FEW CANDIDA TROPICALIS  Final   Report Status 08/16/2019 FINAL  Final  Culture, Urine     Status: None   Collection Time: 08/15/19  6:13 PM   Specimen: Urine, Catheterized  Result Value Ref Range Status   Specimen Description URINE, CATHETERIZED  Final   Special Requests NONE  Final   Culture   Final    NO GROWTH Performed at [facility], [address], [place], [place] [address]    Report Status 08/17/2019 FINAL  Final  Culture, blood (routine x 2)     Status: None   Collection Time: 08/15/19  7:53 PM   Specimen: BLOOD RIGHT HAND  Result Value Ref Range Status   Specimen Description BLOOD RIGHT HAND  Final   Special Requests   Final    BOTTLES DRAWN AEROBIC AND ANAEROBIC Blood Culture adequate volume   Culture   Final    NO GROWTH 5 DAYS Performed at [facility], [place] [address] [place], [place] [address]    Report Status 08/20/2019 FINAL  Final  Culture, blood  (routine x 2)     Status: None   Collection Time: 08/15/19  7:53 PM  Specimen: BLOOD RIGHT HAND  Result Value Ref Range Status   Specimen Description BLOOD RIGHT HAND  Final   Special Requests   Final    BOTTLES DRAWN AEROBIC AND ANAEROBIC Blood Culture adequate volume   Culture   Final    NO GROWTH 5 DAYS Performed at [facility], [address] [place], [place] [address]    Report Status 08/20/2019 FINAL  Final  Aerobic/Anaerobic Culture (surgical/deep wound)     Status: None   Collection Time: 08/17/19  2:32 PM   Specimen: Wound  Result Value Ref Range Status   Specimen Description WOUND  Final   Special Requests NONE  Final   Gram Stain NO WBC SEEN NO ORGANISMS SEEN   Final   Culture   Final    No growth aerobically or anaerobically. Performed at [facility], [place] [address], [place], [place] [address]    Report Status 08/22/2019 FINAL  Final  Culture, fungus without smear     Status: Abnormal (Preliminary result)   Collection Time: 08/20/19  2:00 PM   Specimen: Bronchoalveolar Lavage; Other  Result Value Ref Range Status   Specimen Description BRONCHIAL ALVEOLAR LAVAGE  Final   Special Requests   Final    Normal Performed at [facility], [place] [address] [place], [place] [address]    Culture CANDIDA ALBICANS (A)  Final   Report Status PENDING  Incomplete  Culture, bal-quantitative     Status: Abnormal   Collection Time: 08/20/19  2:00 PM   Specimen: Bronchoalveolar Lavage; Respiratory  Result Value Ref Range Status   Specimen Description BRONCHIAL ALVEOLAR LAVAGE  Final   Special Requests NONE  Final   Gram Stain   Final    FEW WBC PRESENT, PREDOMINANTLY PMN NO ORGANISMS SEEN Performed at [facility], [address], [place], [place] [address]    Culture (A)  Final    4,000 COLONIES/mL CANDIDA TROPICALIS 3,000 COLONIES/mL CANDIDA ALBICANS    Report Status 08/25/2019 FINAL  Final  Body fluid culture     Status: None   Collection  Time: 08/22/19  9:14 AM   Specimen: Pleura; Body Fluid  Result Value Ref Range Status   Specimen Description PLEURAL  Final   Special Requests NONE  Final   Gram Stain   Final    NO WBC SEEN NO ORGANISMS SEEN Performed at [facility], [address], [place], [place] [address]    Culture RARE CANDIDA PARAPSILOSIS  Final   Report Status 08/25/2019 FINAL  Final  Culture, blood (routine x 2)     Status: Abnormal   Collection Time: 08/22/19 12:58 PM   Specimen: BLOOD RIGHT HAND  Result Value Ref Range Status   Specimen Description BLOOD RIGHT HAND  Final   Special Requests   Final    BOTTLES DRAWN AEROBIC ONLY Blood Culture adequate volume   Culture  Setup Time   Final    AEROBIC BOTTLE ONLY YEAST CRITICAL RESULT CALLED TO, READ BACK BY AND VERIFIED WITH: Karsten Ro [facility] 08/25/19 0123 JDW Performed at [facility], [place] [address] [place],  [address]    Culture CANDIDA PARAPSILOSIS (A)  Final   Report Status 08/26/2019 FINAL  Final  Culture, blood (routine x 2)     Status: None   Collection Time: 08/22/19 12:58 PM   Specimen: BLOOD RIGHT HAND  Result Value Ref Range Status   Specimen Description BLOOD RIGHT HAND  Final   Special Requests   Final    BOTTLES DRAWN AEROBIC ONLY Blood Culture adequate volume   Culture   Final    NO GROWTH 5 DAYS Performed at [facility], [address], [place], [place] [address]    Report Status 08/27/2019 FINAL  Final  Blood Culture ID Panel (Reflexed)     Status: Abnormal   Collection Time: 08/22/19 12:58 PM  Result Value Ref Range Status   Enterococcus species NOT DETECTED NOT DETECTED Final   Listeria monocytogenes NOT DETECTED NOT DETECTED Final   Staphylococcus species NOT DETECTED NOT DETECTED Final   Staphylococcus aureus (BCID) NOT DETECTED NOT DETECTED Final   Streptococcus species NOT DETECTED NOT DETECTED Final   Streptococcus agalactiae NOT DETECTED NOT DETECTED Final   Streptococcus pneumoniae NOT  DETECTED NOT DETECTED Final   Streptococcus pyogenes NOT DETECTED NOT DETECTED Final   Acinetobacter baumannii NOT DETECTED NOT DETECTED Final   Enterobacteriaceae species NOT DETECTED NOT DETECTED Final   Enterobacter cloacae complex NOT DETECTED NOT DETECTED Final   Escherichia coli NOT DETECTED NOT DETECTED Final   Klebsiella oxytoca NOT DETECTED NOT DETECTED Final   Klebsiella pneumoniae NOT DETECTED NOT DETECTED Final   Proteus species NOT DETECTED NOT DETECTED Final   Serratia marcescens NOT DETECTED NOT DETECTED Final   Haemophilus influenzae NOT DETECTED NOT DETECTED Final   Neisseria meningitidis NOT DETECTED NOT DETECTED Final   Pseudomonas aeruginosa NOT DETECTED NOT DETECTED Final   Candida albicans NOT DETECTED NOT DETECTED Final   Candida glabrata NOT DETECTED NOT DETECTED Final   Candida krusei NOT DETECTED NOT DETECTED Final   Candida parapsilosis DETECTED (A) NOT DETECTED Final    Comment: CRITICAL RESULT CALLED TO, READ BACK BY AND VERIFIED WITH: J LEDFORD PHARMD 08/25/19 0123 JDW    Candida tropicalis NOT DETECTED NOT DETECTED Final    Comment: Performed at [facility], [address], [place], [place] [address]  Aerobic/Anaerobic Culture (surgical/deep wound)     Status: None (Preliminary result)   Collection Time: 08/24/19  1:35 PM   Specimen: Abscess  Result Value Ref Range Status   Specimen Description ABSCESS  Final   Special Requests DRAIN  Final   Gram Stain   Final    RARE WBC PRESENT, PREDOMINANTLY PMN NO ORGANISMS SEEN    Culture   Final    NO GROWTH 4 DAYS NO ANAEROBES ISOLATED; CULTURE IN PROGRESS FOR 5 DAYS Performed at [facility], [place] [address], [place], [place] [address]    Report Status PENDING  Incomplete  Culture, blood (routine x 2)     Status: None (Preliminary result)   Collection Time: 08/25/19  3:53 PM   Specimen: BLOOD RIGHT HAND  Result Value Ref Range Status   Specimen Description BLOOD RIGHT HAND  Final   Special  Requests   Final    BOTTLES DRAWN AEROBIC AND ANAEROBIC Blood Culture adequate volume   Culture   Final    NO GROWTH 3 DAYS Performed at [facility], [place] [address] [place], [place] [address]    Report Status PENDING  Incomplete  Culture, blood (routine x 2)     Status: None (Preliminary result)   Collection Time: 08/25/19  3:53 PM   Specimen: BLOOD RIGHT ARM  Result Value Ref Range Status   Specimen Description BLOOD RIGHT ARM  Final   Special Requests   Final    BOTTLES DRAWN AEROBIC ONLY Blood Culture adequate  volume   Culture   Final    NO GROWTH 3 DAYS Performed at [facility], [place] [address] [place], [place] [address]    Report Status PENDING  Incomplete    Studies/Results: DG Chest Port 1 View  Result Date: 08/27/2019 CLINICAL DATA:  Respiratory failure EXAM: PORTABLE CHEST 1 VIEW COMPARISON:  08/25/2019 FINDINGS: Tracheostomy tube in satisfactory positioning. Enteric tube courses below the diaphragm with distal tip beyond the inferior margin of the film. Bilateral chest tubes remain in position. Left-sided PICC line terminates at the level of the superior cavoatrial junction. Post CABG changes. Stable cardiomediastinal contours. Diffuse interstitial prominence, left slightly greater than right, is similar in appearance to prior. Probable small left pleural effusion. No pneumothorax. IMPRESSION: 1. No significant interval change from prior. 2. Diffuse interstitial prominence, left slightly greater than right. Probable small left pleural effusion. 3. Support lines and tubes, as above. Electronically Signed   By: Davina Poke D.O.   On: 08/27/2019 08:05      Assessment/Plan:  INTERVAL HISTORY: patients' fever curve better   Principal Problem:   Candidemia ([place]) Active Problems:   CHF (congestive heart failure) (HCC)   Chronic pain syndrome   Mixed hyperlipidemia   Moderate recurrent major depression (HCC)   Hypothyroidism   Acute systolic heart  failure (HCC)   Type 2 diabetes mellitus with hyperglycemia (HCC)   AKI (acute kidney injury) ([place])   Coronary artery disease involving native coronary artery of native heart with unstable angina pectoris ([place])   Coronary artery disease   Malnutrition of moderate degree   Pressure injury of skin   Acute on chronic respiratory failure (HCC)   Acute respiratory failure (HCC)   S/P CABG (coronary artery bypass graft)   Acalculous cholecystitis   ARDS (adult respiratory distress syndrome) ([place])   Cardiogenic shock ([place])    Gerry Blanchfield Minks is a 64 y.o. male with  complicated hospitalization following CABG that required [place] ECMO>>Impella support following several arrests and open chest procedures at the bedside. He has been on long term carbapenem up until recently when we switched him to zosyn and had gallbladder drain placed for rising bilirubinemia/cholangitis --> no obstructing stones on HIDA, ?Acalculous cholecystitis.   No growth from aspirated bile from perc-chole drain  He was found to have candida parapsilosis in blood  #1 candidemia:  Continue fluconazole  When able to discontinue central lines he should have a "line holiday with peripheral IV access and repeat blood cultures.  He should then have 2 weeks of antifungal therapy  He needs a dedicated funduscopic exam by ophthalmology to exclude endophthalmitis  #2 possible acalculous cholecystitis I would have him complete Zosyn through tomorrow and then stop   LOS: 32 days   Alcide Evener 08/28/2019, 2:23 PM

## 2019-08-28 NOTE — Progress Notes (Signed)
Trach sutures removed per MD order. 

## 2019-08-28 NOTE — Progress Notes (Signed)
Nutrition Follow-up  DOCUMENTATION CODES:   Non-severe (moderate) malnutrition in context of acute illness/injury  INTERVENTION:   Tube Feeding via 10 fr small bore feeding tube  Increase Pivot 1.5 at 65 ml/hr Prosource TF 45 mL daily Provides 157 g of protein, 2380 kcals,1094 mL of free water Meets 100% of estimated calorie and protein needs  Add Juven BID, each packet provides 80 calories, 8 grams of carbohydrate, 2.5  grams of protein (collagen), 7 grams of L-arginine and 7 grams of L-glutamine; supplement contains CaHMB, Vitamins C, E, B12 and Zinc to promote wound healing   NUTRITION DIAGNOSIS:   Moderate Malnutrition related to acute illness as evidenced by mild fat depletion, mild muscle depletion, moderate muscle depletion.  Being addressed via TF   GOAL:   Patient will meet greater than or equal to 90% of their needs  Met  MONITOR:   Vent status, TF tolerance, Labs, Weight trends  REASON FOR ASSESSMENT:   Consult  ([facility] tube feeding recommendations)  ASSESSMENT:   Patient with PMH significant for CHF, COPD, HTN, DM, HLD, MDD, and BPH. Presents this admission with CHF exacerbation.  6/24- s/p CABG x4 6/25- VT arrest, emergent sternotomy, VA ECMO cannulation 6/28- De-cannulated, Impella placed 6/29-Bedside Mediastinal Exploration 2/2 tamponade with multiple blood clots removed 7/02 OR for sternal closure with application of wound vac, Cortrak placed but post-pyloric placement unsuccessful 7/03 TF stared at 20 ml/hr but pt with emesis, OG to LWS with 850 mL out 7/04 TPN started at 30 ml/hr 7/05 TPN remains at 30 ml/hr 7/06 TPN at 55 ml/hr, IR placed 10 fr post-pyloric feeding tube. Trickle TF initiated 7/07 TF titration, TPN discontinued 7/09 HIT positive, bivalirudin started 7/09 Impella removed 7/12 Trach placed 7/16 Cholecystostomy tube  Pt currently on trach collar, tolerating short trach collar trials with vent support as  needed  Hyperbilirubinemia due to acalculous cholecystitis requiring cholecystostomy tube; LFTs trending down   Pivot 1.5 at 60 ml/hr, ProSource TF 45 mL BID, free water 200 mL q 4 hours via postpyloric tube  Hypernatremia continues but free water flushes adjusted today     WOC RN continues to follow unstageable PI to coccyx. Per today's notes, pt with 8 cm x 6 cm wound with nonviable tissue in wound bed present depth assessment  Current weight 70.4 kg; no weight yesterday 67.9 kg. admission weight of 77 kg. Net negative 19 L per i/O flow sheet  Chest tubes with 640 mL out, JP drain minimal output  Labs: sodium 149 (H) Meds: ss novolog, ss novolog, levemir, questran, KCl   Diet Order:   Diet Order            Diet NPO time specified  Diet effective midnight                 EDUCATION NEEDS:   Education needs have been addressed  Skin:  Skin Assessment: Skin Integrity Issues: Skin Integrity Issues:: Unstageable DTI: n/a Unstageable: Sacrum (DTI evolved to [facility] RN following Incisions: R leg, R groin Other: open chest  Last BM:  7/13  Height:   Ht Readings from Last 1 Encounters:  08/27/19 '5\' 9"'  (1.753 m)    Weight:   Wt Readings from Last 1 Encounters:  08/28/19 70.4 kg    Ideal Body Weight:     BMI:  Body mass index is 22.92 kg/m.  Estimated Nutritional Needs:   Kcal:  7253-6644 kcals  Protein:  130-160 g  Fluid:  >/= 1.8 L/day  Kerman Passey MS, RDN, LDN, CNSC Registered Dietitian III Clinical Nutrition RD Pager and On-Call Pager Number Located in [place]

## 2019-08-28 NOTE — Consult Note (Signed)
WOC Nurse Consult Note: Reason for Consult: follow up visit on coccygeal pressure injury. Deep tissue pressure injury in evolution, currently an Unstageable PI Wound type: Pressure, perfusion Pressure Injury POA: No Measurement: 8cm x 6cm with nonviable tissue in wound bed preventing assessment of depth  Wound TUU:EKCM firmly adherent tissue in center, white/grey nonviable tissue at periphery Drainage (amount, consistency, odor) Small amount of grey/brown exudate on old dressing. Periwound: mild ecchymosis in perineal space Dressing procedure/placement/frequency: Continue collagenase (Santyl) to the nonviable tissue daily topping with saline moistened gauze 2x2, dry 2x2 and cover with silicone foam. Staff continue to turn from side to side and minimize any time at all in the supine position. WOC nursing team will follow, seeing every 7-10 days and will remain available to this patient, the nursing and medical teams.  Thanks, Ladona Mow, MSN, RN, GNP, [place]  Pager# [phone]

## 2019-08-28 NOTE — Progress Notes (Signed)
Referring Physician(s): Dr. Katrinka Blazing   Supervising Physician: Oley Balm  Patient Status:  [facility] - In-pt  Chief Complaint:  Acute acalculous cholecystitis s/p cholecystomy tube placed on 7.16.21  Subjective:  64 y.o. male inpatient. History of CHF exacerbation, cardiogenic/ hemorrhagic shock s/p CABG on 6.24.21 complicated by VT arrest, ECMO (decannulated on 6.28.21) s/p impella placement on 6.28.21 removed on 7.9.21) now trached and vented in MOD with hyperbilirubinuria and ongoing fevers with leukocytosis despite IV antibiotics. Found to have what is thought to be acalculous cholecystitis. IR placed a cholecystomy tube on tube on 7.16.21. Patient nonverbal but is alert and can nod his head appropriately  Allergies: Empagliflozin, Heparin, Other, Simvastatin, and Sitagliptin  Medications: Prior to Admission medications   Medication Sig Start Date End Date Taking? Authorizing Provider  citalopram (CELEXA) 40 MG tablet Take 40 mg by mouth daily.   Yes [provider]  dapagliflozin propanediol (FARXIGA) 10 MG TABS tablet Take 10 mg by mouth daily.   Yes [provider]  ergocalciferol (VITAMIN D2) 1.25 MG (50000 UT) capsule Take 50,000 Units by mouth 2 (two) times a week.   Yes [provider]  furosemide (LASIX) 20 MG tablet Take 40 mg by mouth daily. 07/16/19  Yes [provider]  glipiZIDE-metformin (METAGLIP) 2.5-500 MG tablet Take 2 tablets by mouth 2 (two) times daily with a meal.   Yes [provider]  ibuprofen (ADVIL) 200 MG tablet Take 400 mg by mouth 2 (two) times daily as needed for moderate pain.   Yes [provider]  levofloxacin (LEVAQUIN) 750 MG tablet Take 750 mg by mouth daily. For 14 days.   Yes [provider]  lisinopril (ZESTRIL) 20 MG tablet Take 20 mg by mouth daily.   Yes [provider]  oxyCODONE (ROXICODONE) 15 MG immediate release tablet Take 15 mg by mouth 3 (three) times daily.    Yes [provider]  pioglitazone (ACTOS) 45 MG tablet Take 45 mg by mouth daily.   Yes [provider]  promethazine (PHENERGAN) 25 MG tablet Take 25 mg by mouth 2 (two) times daily as needed for nausea or vomiting.   Yes [provider]     Vital Signs: BP 132/83    Pulse 84    Temp 98.1 F (36.7 C)    Resp 20    Ht 5\' 9"  (1.753 m)    Wt 155 lb 3.3 oz (70.4 kg)    SpO2 100%    BMI 22.92 kg/m   Physical Exam Vitals and nursing note reviewed.  Constitutional:      Appearance: He is ill-appearing.  HENT:     Head: Normocephalic.  Cardiovascular:     Rate and Rhythm: Normal rate and regular rhythm.  Pulmonary:     Comments: Trached and vented.  Abdominal:     Comments: Positive cholecystomy  drain  to gravity bag. site is unremarkable with no erythema, edema, tenderness, bleeding or drainage noted at exit site. Suture and stat lock in place. Dressing is clean dry and intact. 5 ml of  amber colored fluid noted in gravity bag . Drain is able to be flushed easily.    Musculoskeletal:     Cervical back: Normal range of motion.  Skin:    General: Skin is dry.  Neurological:     Mental Status: He is alert.     Imaging: DG Chest Port 1 View  Result Date: 08/27/2019 CLINICAL DATA:  Respiratory failure EXAM: PORTABLE CHEST  1 VIEW COMPARISON:  08/25/2019 FINDINGS: Tracheostomy tube in satisfactory positioning. Enteric tube courses below the diaphragm with distal tip beyond the inferior margin of the film. Bilateral chest tubes remain in position. Left-sided PICC line terminates at the level of the superior cavoatrial junction. Post CABG changes. Stable cardiomediastinal contours. Diffuse interstitial prominence, left slightly greater than right, is similar in appearance to prior. Probable small left pleural effusion. No pneumothorax. IMPRESSION: 1. No significant interval change from prior. 2. Diffuse interstitial prominence, left slightly greater than right.  Probable small left pleural effusion. 3. Support lines and tubes, as above. Electronically Signed   By: Duanne Guess D.O.   On: 08/27/2019 08:05   DG Chest Port 1 View  Result Date: 08/25/2019 CLINICAL DATA:  Status post CABG. EXAM: PORTABLE CHEST 1 VIEW COMPARISON:  08/20/1999 FINDINGS: Tracheostomy tube in satisfactory position. Feeding tube extending into the stomach. Stable bilateral chest tubes. No pneumothorax. Left PICC tip approximately 3 cm inferior to the superior cavoatrial junction. Mildly improved dense left lower lobe opacification. Small bilateral pleural effusions, improved on the right. Stable prominence of the interstitial markings. Stable post CABG changes. No acute bony abnormality. IMPRESSION: 1. Mildly improved dense left lower lobe atelectasis or pneumonia. 2. Small bilateral pleural effusions, improved on the right. 3. Stable probable combination of chronic interstitial lung disease and interstitial pulmonary edema. Electronically Signed   By: Beckie Salts M.D.   On: 08/25/2019 10:51   CT PERC CHOLECYSTOSTOMY  Result Date: 08/24/2019 INDICATION: Acute cholecystitis. Poor operative candidate. Please perform image guided placement cholecystostomy tube for infection source control purposes As the interventional radiology suite is currently being utilized for a complex endoleak procedure, we will proceed with placement of the cholecystostomy tube with ultrasound and CT guidance to facilitate drainage. EXAM: ULTRASOUND AND FLUOROSCOPIC-GUIDED CHOLECYSTOSTOMY TUBE PLACEMENT COMPARISON:  CT abdomen pelvis-08/23/2019; nuclear medicine HIDA scan-08/23/2018 MEDICATIONS: The patient is currently admitted to the hospital and on intravenous antibiotics. Antibiotics were administered within an appropriate time frame prior to skin puncture. ANESTHESIA/SEDATION: Moderate (conscious) sedation was employed during this procedure. A total of Versed 0.5 mg and Fentanyl 12.5 mcg was administered  intravenously. Moderate Sedation Time: 20 minutes. The patient's level of consciousness and vital signs were monitored continuously by radiology nursing throughout the procedure under my direct supervision. CONTRAST:  None FLUOROSCOPY TIME:  Not provided COMPLICATIONS: None immediate. PROCEDURE: Informed written consent was obtained from the patient's family after a discussion of the risks, benefits and alternatives to treatment. Questions regarding the procedure were encouraged and answered. A timeout was performed prior to the initiation of the procedure. The right upper abdominal quadrant was prepped and draped in the usual sterile fashion, and a sterile drape was applied covering the operative field. Maximum barrier sterile technique with sterile gowns and gloves were used for the procedure. A timeout was performed prior to the initiation of the procedure. Local anesthesia was provided with 1% lidocaine with epinephrine. Ultrasound scanning of the right upper quadrant demonstrates a moderately dilated gallbladder with minimal amount of gallbladder wall thickening, however this is nonspecific finding in the setting known trace intra-abdominal ascites. Utilizing a transhepatic approach, an 18 gauge needle was advanced into the gallbladder under direct ultrasound guidance. An ultrasound image was saved for documentation purposes. Short Amplatz wire was coiled in gallbladder lumen. Appropriate positioning was confirmed with CT imaging. Next, the track was serially dilated allowing placement of a 10.2-French Cook cholecystomy tube into the gallbladder lumen where it was coiled and locked. Appropriate  positioning was confirmed with CT imaging. Bile was aspirated and a small amount of aspirated bile was capped and sent to the laboratory for analysis. The catheter was secured to the skin with an interrupted suture, connected to a drainage bag and a dressing was applied. The patient tolerated the procedure well without  immediate post procedural complication. IMPRESSION: Successful ultrasound and CT guided placement of a 10.2 French cholecystostomy tube. A small amount of aspirated bile was capped and sent to the laboratory for analysis Electronically Signed   By: Simonne Come M.D.   On: 08/24/2019 14:18    Labs:  CBC: Recent Labs    08/25/19 0302 08/26/19 0228 08/27/19 0218 08/28/19 0234  WBC 19.4* 24.7* 29.1* 24.2*  HGB 8.1* 8.7* 8.5* 7.4*  HCT 28.8* 29.3* 29.4* 26.0*  PLT 392 406* 380 362    COAGS: Recent Labs    08/06/19 0340 08/06/19 0340 08/07/19 0347 08/07/19 1442 08/08/19 0302 08/20/19 1202 08/21/19 0406 08/26/19 0228 08/27/19 0218 08/28/19 0234 08/28/19 0400  INR 1.2  --  1.3* 1.2  --  1.6*  --   --   --   --   --   APTT 94*   < > 44*  --    < >  --    < > 74* 72* 120* 76*   < > = values in this interval not displayed.    BMP: Recent Labs    08/26/19 1727 08/27/19 0218 08/27/19 1600 08/28/19 0234  NA 146* 143 144 149*  K 3.9 4.2 4.0 4.0  CL 111 109 112* 113*  CO2 26 25 24 24   GLUCOSE 185* 114* 220* 77  BUN 51* 46* 43* 40*  CALCIUM 8.3* 8.1* 8.1* 7.6*  CREATININE 1.18 1.09 1.08 1.08  GFRNONAA >60 >60 >60 >60  GFRAA >60 >60 >60 >60    LIVER FUNCTION TESTS: Recent Labs    08/25/19 0302 08/25/19 1538 08/26/19 0228 08/26/19 0228 08/26/19 1727 08/27/19 0218 08/27/19 1600 08/28/19 0234  BILITOT 12.1*  --  12.0*  --   --  10.2*  --  8.7*  AST 183*  --  166*  --   --  129*  --  96*  ALT 181*  --  163*  --   --  139*  --  107*  ALKPHOS 499*  --  576*  --   --  496*  --  395*  PROT 6.7  --  6.1*  --   --  5.7*  --  6.8  ALBUMIN 2.7*   < > 2.3*   < > 2.2* 2.1* 2.2* 1.9*   < > = values in this interval not displayed.    Assessment and Plan:  64 y.o, male inpatient. History of CHF exacerbation, cardiogenic/ hemorrhagic shock s/p CABG on 6.24.21 complicated by VT arrest, ECMO (decannulated on 6.28.21) s/p impella placement on 6.28.21 removed on 7.9.21) now  trached and vented in MOD with hyperbilirubinuria and ongoing fevers with leukocytosis despite IV antibiotics. Found to have what is thought to be acalculous cholecystitis. IR placed a cholecystomy tube on tube on 7.16.21. 5 ml of  amber colored fluid noted in gravity bag  per Epic output is    15 ml, 85 ml, 40 ml  No additional images since drain placement. Patient nonverbal but is alert and can nod his head appropriately. WBC is 24.2. No growth from cholestomy tube, BUN 43,. Latest blood cultures from 7.17 shows no growth in 3 days.  All labs  and medications are within acceptable parameters.  Patient is afebrile.  Recommend team continue with flushing TID, output recording q shift and dressing changes as needed. Would consider additional imaging when output is less than 10 ml for 24 hours not including flush material.   Continue current treatment plans as per Critical Care.   Electronically Signed: Alene Mires, NP 08/28/2019, 12:09 PM   I spent a total of 25 Minutes at the patient's bedside AND on the patient's hospital floor or unit, greater than 50% of which was counseling/coordinating care for cholecystomy.

## 2019-08-28 NOTE — Progress Notes (Addendum)
Patient ID: Todd [name], male   DOB: 11/28/1955, 64 y.o.   MRN: [id number]     Advanced Heart Failure Rounding Note  PCP-Cardiologist: No primary care provider on file.   Subjective:    08/02/19 CABG 08/03/19 VF arrest. Chest open at bedside 08/03/19 Back to OR for ECMO cannulation 08/03/19 Washout out for tamponade 08/04/19 Repeat bedside washout for tamponade 08/06/19 To OR for decannulation and Impella 5.5 08/07/19 Underwent emergent bedside chest exploration at bedside for tamponade  08/10/19 Chest closed 08/16/19 HIT positive, bivalirudin begun 08/17/19 Impella removed 08/20/19 Tracheostomy 08/23/19 HIDA scan showed patent common bile duct (unable to visualize GB, unable to rule out acalculous cholecystitis).   08/24/19 cholecystostomy tube  Remains on Milrinone 0.125, NE 20. Off VP. Co-ox marginal at 58%. MAPs mid 60s by a-line.   Diuretics held yesterday for low CVP of 5. -1.6L in UOP. CVP 9-10   Brief runs of NSVT on tele K 4.2. Mg 2.2.    Meropenem and [name] stopped 7/14, Zosyn started.  Now with Candida from BAL and blood culture, started on Diflucan.  WBC trending back down up 19>>24>>29>>24K. AF. ID following.   Now getting tube feeds via post-pyloric tube.   AKI resolved. SCr down from 1.6>>1.1   Jaundiced with consistently elevated bilirubin (direct), tbili decreasing slowly at 8.7 today.  Abdominal US with gallbladder sludge, no definite cholecystitis.  HIDA scan with patent CBD but no gallbladder activity. Abdominal CT with gallbladder sludge. Now s/p cholecystostomy tube.   Objective:   Weight Range: 70.4 kg Body mass index is 22.92 kg/m.   Vital Signs:   Temp:  [97.9 F (36.6 C)-99.5 F (37.5 C)] 98.1 F (36.7 C) (07/20 0815) Pulse Rate:  [80-94] 86 (07/20 0830) Resp:  [17-27] 17 (07/20 0830) BP: (51-160)/(28-134) 61/46 (07/20 0800) SpO2:  [97 %-100 %] 100 % (07/20 0830) Arterial Line BP: (94-116)/(41-51) 94/41 (07/20 0830) FiO2 (%):  [40 %] 40 % (07/20  0748) Weight:  [70.4 kg] 70.4 kg (07/20 0210) Last BM Date: 08/27/19  Weight change: Filed Weights   08/26/19 0450 08/27/19 0412 08/28/19 0210  Weight: 67.9 kg 67.9 kg 70.4 kg    Intake/Output:   Intake/Output Summary (Last 24 hours) at 08/28/2019 0839 Last data filed at 08/28/2019 0830 Gross per 24 hour  Intake 1254.29 ml  Output 2925 ml  Net -1670.71 ml      Physical Exam    General: ill appearing. Awake. On vent through TC Neck: + TC. JVD not well visuallized, no thyromegaly or thyroid nodule.  Lungs: Decreased at bases bilaterally. No wheezing  CV: Nondisplaced PMI.  Heart regular S1/S2, no S3/S4, no murmur.  Trace bilateral LEE Abdomen: Soft, nontender, no hepatosplenomegaly, no distention.  Skin: Jaundiced.  Neurologic: awake on TC Extremities: trace bilateral LEE edema. No rashes  HEENT: Normal. + cor track   Telemetry   NSR 80s, brief runs of NSVT .  Personally reviewed   Labs    CBC Recent Labs    08/27/19 0218 08/28/19 0234  WBC 29.1* 24.2*  HGB 8.5* 7.4*  HCT 29.4* 26.0*  MCV 98.3 102.4*  PLT 380 373   Basic Metabolic Panel Recent Labs    08/26/19 0228 08/26/19 0228 08/26/19 1727 08/26/19 1727 08/27/19 0218 08/27/19 0218 08/27/19 1600 08/28/19 0234  NA 148*   < > 146*   < > 143   < > 144 149*  K 3.8   < > 3.9   < > 4.2   < >  4.0 4.0  CL 109   < > 111   < > 109   < > 112* 113*  CO2 29   < > 26   < > 25   < > 24 24  GLUCOSE 170*   < > 185*   < > 114*   < > 220* 77  BUN 65*   < > 51*   < > 46*   < > 43* 40*  CREATININE 1.45*   < > 1.18   < > 1.09   < > 1.08 1.08  CALCIUM 8.2*   < > 8.3*   < > 8.1*   < > 8.1* 7.6*  MG 2.4  --   --   --  2.2  --   --   --   PHOS  --   --  3.7  --   --   --  3.5  --    < > = values in this interval not displayed.   Liver Function Tests Recent Labs    08/27/19 0218 08/27/19 0218 08/27/19 1600 08/28/19 0234  AST 129*  --   --  96*  ALT 139*  --   --  107*  ALKPHOS 496*  --   --  395*  BILITOT 10.2*   --   --  8.7*  PROT 5.7*  --   --  6.8  ALBUMIN 2.1*   < > 2.2* 1.9*   < > = values in this interval not displayed.   No results for input(s): LIPASE, AMYLASE in the last 72 hours. Cardiac Enzymes No results for input(s): CKTOTAL, CKMB, CKMBINDEX, TROPONINI in the last 72 hours.  BNP: BNP (last 3 results) Recent Labs    07/26/19 1236  BNP 2,568.2*    ProBNP (last 3 results) No results for input(s): PROBNP in the last 8760 hours.   D-Dimer No results for input(s): DDIMER in the last 72 hours. Hemoglobin A1C No results for input(s): HGBA1C in the last 72 hours. Fasting Lipid Panel Recent Labs    08/27/19 0218  TRIG 213*   Thyroid Function Tests No results for input(s): TSH, T4TOTAL, T3FREE, THYROIDAB in the last 72 hours.  Invalid input(s): FREET3  Other results:   Imaging    No results found.   Medications:     Scheduled Medications: . amiodarone  200 mg Per Tube BID  . aspirin  81 mg Per Tube Daily  . chlorhexidine gluconate (MEDLINE KIT)  15 mL Mouth Rinse BID  . Chlorhexidine Gluconate Cloth  6 each Topical Daily  . collagenase   Topical Daily  . docusate  200 mg Per Tube Daily  . feeding supplement (PROSource TF)  45 mL Per Tube BID  . insulin aspart  0-24 Units Subcutaneous Q4H  . insulin aspart  5 Units Subcutaneous Q4H  . insulin detemir  5 Units Subcutaneous BID  . ipratropium  0.5 mg Nebulization TID BM  . levalbuterol  1.25 mg Nebulization TID BM  . mouth rinse  15 mL Mouth Rinse 10 times per day  . midodrine  10 mg Per Tube TID WC  . pantoprazole sodium  40 mg Per Tube Daily  . potassium chloride  40 mEq Per Tube BID  . sodium chloride flush  3 mL Intravenous Q12H  . sodium chloride flush  5 mL Intracatheter Q8H    Infusions: . sodium chloride 250 mL (08/13/19 1412)  . sodium chloride Stopped (08/13/19 1631)  . bivalirudin (ANGIOMAX) infusion  0.5 mg/mL (Non-ACS indications) 0.055 mg/kg/hr (08/28/19 0830)  . feeding supplement (PIVOT  1.5 CAL) 1,000 mL (08/27/19 2345)  . fluconazole (DIFLUCAN) IV Stopped (08/28/19 0418)  . lactated ringers 20 mL/hr at 08/10/19 1705  . lactated ringers 10 mL/hr at 08/18/19 0054  . milrinone 0.125 mcg/kg/min (08/28/19 0830)  . norepinephrine (LEVOPHED) Adult infusion 20 mcg/min (08/28/19 0830)  . piperacillin-tazobactam (ZOSYN)  IV 12.5 mL/hr at 08/28/19 0830    PRN Medications: sodium chloride, sodium chloride, dextrose, fentaNYL (SUBLIMAZE) injection, metoprolol tartrate, ondansetron (ZOFRAN) IV, sodium chloride flush   Assessment/Plan   1. Shock: At this point, think mixed cardiogenic/septic.  Echo with EF 20-25%, moderate RV dysfunction.  VA ECMO post-VF arrest, cannulated 6/25.  Stable s/p return to OR for mediastinal re-exploration due to bleeding on 6/25.  Decannulated from [place] ECMO on 08/06/19. Impella 5.5 placed. Underwent emergent chest re-exploration on 6/29 for tamponade and clot removal. Chest closed 7/2.  Impella 5.5 removed 7/9.  This morning, he is on NE 28, milrinone 0.125, off vasopressin.  Suspect component of septic/vasodilatory shock with fever, elevated WBCs.   - Co-ox 58%. MAPs in mid 60s - further increase midodrine to 15 mg tid and continue to wean NE.  - continue milrinone at 0.125 mcg - CVP 10. Give IV Lasix 40 mg x 1 today  2. 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  Prolonged VF arrest on 6/25. Chest closed 7/2.  - ASA  - Hold Crestor with elevated LFTs.  3. Cardiac arrest: VF arrest with prolonged code. Still with episodes of VT/NSVT in setting of low K (difficult to effectively replace).  - Continue PO amio 200 mg bid.   - Eventual evaluation for ICD or lifevest at d/c - Keep K > 4.0 Mg > 2.0 4. Acute hypoxemic respiratory failure: In setting of cardiac arrest.  H/o COPD.  Bibasilar infiltrates, possible HCAP.  Now with tracheostomy. On Zosyn + diflucan with candida in BAL and blood culture.  Currently CPAP via trach. -  Trach collar trials per CCM.  5. Acute blood loss anemia: Now s/p mediastinal re-exploration x 3. Hgb down to 7.4 today.  CT drainage has slowed.   - Now on bivalirudin gtt. - monitor hgb. 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  Platelets have trended back up 362 today  7. ID: Suspect HCAP initially, on meropenem/anidulafungin/vanco initially with ongoing fevers and leukocytosis. Venous dopplers negative for DVT. Possible drug fever from meropenem, he was switched to Zosyn on 7/14 and is now afebrile.  He had abdominal US with gallbladder sludge, no definite cholecystitis.  HIDA scan done, GB does not opacify => ?acalculous cholecystitis => now with cholecystostomy tube.  Most recently, Candida in blood culture and BAL, he is on Diflucan.  AF past 24 hrs but WBC ct continues to rise, 19>>25>>29.   - ID following.  - Continue Zosyn + Diflucan.  - needs dedicated funduscopic exam performed by ophthalmology to exclude fungal endophthalmitis - per ID, will also need a line holiday at some point 8. Elevated bilirubin: Mostly direct = likely cholestatic/shock liver/RV failure predominantly.   Abdominal US with evidence for cirrhosis, sludge in GB but no definite acute cholecystitis. NH3 was not significantly elevated. Tbili 8.7 today, gradually lower.  HIDA scan showed patent common bile duct, so not obstructive stone causing elevated bilirubin.  9. Ileus: Improved.  Getting TFs.  10. Atrial fibrillation/flutter: Paroxysmal. Back in NSR today.     -  Continue PO amio, 200 mg bid  - bivalirudin gtt.  11. Neuro: Now following commands per nursing. CT head negative on 7/11. EEG with e/o diffuse encephalopathy.  12. Hypernatremia: NA level normal today at 149. Resume free water boluses. Monitor   Length of Stay: [address] PA-C  08/28/2019, 8:39 AM  Patient seen with PA, agree with the above note.   Co-ox 58% today, CVP 9.  He is on NE 20, milrinone  0.125.  Weight up. WBCs down some to 24. Tbili down to 8.7.    General: Awake Neck: Vent via trach. No JVD, no thyromegaly or thyroid nodule.  Lungs: Decreased at bases.  CV: Nondisplaced PMI.  Heart regular S1/S2, no S3/S4, no murmur.  No peripheral edema.   Abdomen: Soft, nontender, no hepatosplenomegaly, no distention.  Skin: Intact without lesions or rashes.  Neurologic: Follows commands, awake.  Extremities: No clubbing or cyanosis.  HEENT: Normal.   Suspect primarily septic shock at this point with Candidemia.  Adequate co-ox at 58%.  - Increase midodrine to 15 mg tid and continue to try to wean NE.  - Continue current milrinone 0.125.   CVP 9 today on my measure and weight up, will give Lasix 40 mg IV x 1 today.   Continue Zosyn and fluconazole coverage, appreciate ID assistance.  Will need ophthalmology eval for fungal endophthalmitis.   Bilirubin decreasing.  Still has biliary drain, but do not think elevated tbili is related to gallbladder pathology as HIDA scan showed patent common bile duct (unable to visualize GB, unable to rule out acalculous cholecystitis).   CRITICAL CARE Performed by: Loralie Champagne  Total critical care time: 35 minutes  Critical care time was exclusive of separately billable procedures and treating other patients.  Critical care was necessary to treat or prevent imminent or life-threatening deterioration.  Critical care was time spent personally by me on the following activities: development of treatment plan with patient and/or surrogate as well as nursing, discussions with consultants, evaluation of patient's response to treatment, examination of patient, obtaining history from patient or surrogate, ordering and performing treatments and interventions, ordering and review of laboratory studies, ordering and review of radiographic studies, pulse oximetry and re-evaluation of patient's condition.  Loralie Champagne 08/28/2019 4:37 PM

## 2019-08-28 NOTE — Progress Notes (Addendum)
Pharmacy Antibiotic Note  Todd [name] is a 64 y.o. male admitted on 07/26/2019 with CHF exacerbation. 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 VT arrest on 6/25 and underwent [facility] ECMO. Decannulated on 6/28 and Impella placed. Chest closed on 7/2 and Imella removed 7/9. Cholecystostomy tube placed on 7/16.  Was on antibiotics initially for possible HCAP. No definite cholecystitis was seen on abd [place]. Now with candida in blood cultures and BAL. No growth from aspirated bile from perc-chole drain. Pharmacy has been consulted for fluconazole for candidemiia and Zosyn per MD for possible acalculous cholecystitis.  Plan: Zosyn 3.375g IV q8h (4 hour infusion). Continue through 7/23 per ID (tentatively - no stop date added yet) Fluconazole 400 mg IV q24h  Follow cultures, renal function, needs fundoscopic exam  Height: _0  (175.3 cm) Weight: 70.4 kg (155 lb 3.3 oz) IBW/kg (Calculated) : 70.7  Temp (24hrs), Avg:98.5 F (36.9 C), Min:97.9 F (36.6 C), Max:99.5 F (37.5 C)  Recent Labs  Lab 08/24/19 2043 08/24/19 2053 08/25/19 0302 08/25/19 0801 08/26/19 0228 08/26/19 1727 08/27/19 0218 08/27/19 1600 08/28/19 0234  WBC 18.7*  --  19.4*  --  24.7*  --  29.1*  --  24.2*  CREATININE  --    < > 1.63*   < > 1.45* 1.18 1.09 1.08 1.08   < > = values in this interval not displayed.    Estimated Creatinine Clearance: 67.9 mL/min (by C-G formula based on SCr of 1.08 mg/dL).    Allergies  Allergen Reactions  . Empagliflozin     Other reaction(s): diarrhea  . 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 Other     Other reaction(s): Unknown  . Simvastatin     Other reaction(s): diarrhea  . Sitagliptin     Other reaction(s): diarrhea/abd pain    Antimicrobials this admission: Zosyn 7/14>> Fluconazole 7/17>> Cefuroxime 6/23>>6/25 Vancomycin 6/24>6/24; resume 6/26>>7/2, 7/5>>7/13 Meropenem 6/26>>7/4, 7/5>>7/14 [name] 7/7>>7/14  Dose adjustments  thisdosing regimen: 6/30 VR 33, 7/1 22 (ke 0.026, T1/2 26.6) - change to vancomycin 1750 mg q36 7/7 VT = 19 (drawn prior to 3rd dose) - reduce to 1g IV every 24 hours 7/11 VT 21 - reduce to 750 q 24, retime for later tonight>start 7/12 AM  Microbiology results: 7/3+7/5 TA - few yeast> few candida topicalis  7/5 + 7/7 Bcx: neg 7/7 Ucx: neg 7/9 Wound Cx: neg 7/12 BAL: candida albicans, candida tropicalis 7/14 pleural fluid: rare candida parapsilosis 7/14 Blood>>>candida parapsilosis  7/16 abscess: ngtd 7/17 BCx: ngtd  Thank you for allowing pharmacy to be a part of this patient's care.  Vertis Kelch, PharmD, BCPS Phone [phone] 08/28/2019       11:48 AM  Please check [email or web address] for unit-specific pharmacist phone numbers

## 2019-08-28 NOTE — Consult Note (Signed)
OPHTHALMOLOGY CONSULT NOTE   HPI: 64 yo M with complicated hospitalization following CABG. Pt has candidemia and ophthalmology consulted to rule out fungal endophthalmitis. Blood culture from 08/22/19 grew C. Parapsilosis. Pt currently on IV fluconazole -- started 08/25/19. Pt is trach'd and nonverbal. Unable to follow commands or answer yes/no questions for me.  OHx: unknown  ORx: none per chart review   No current facility-administered medications on file prior to encounter.   Current Outpatient Medications on File Prior to Encounter  Medication Sig Dispense Refill  . citalopram (CELEXA) 40 MG tablet Take 40 mg by mouth daily.    . dapagliflozin propanediol (FARXIGA) 10 MG TABS tablet Take 10 mg by mouth daily.    . ergocalciferol (VITAMIN D2) 1.25 MG (50000 UT) capsule Take 50,000 Units by mouth 2 (two) times a week.    . furosemide (LASIX) 20 MG tablet Take 40 mg by mouth daily.    Marland Kitchen glipiZIDE-metformin (METAGLIP) 2.5-500 MG tablet Take 2 tablets by mouth 2 (two) times daily with a meal.    . ibuprofen (ADVIL) 200 MG tablet Take 400 mg by mouth 2 (two) times daily as needed for moderate pain.    Marland Kitchen levofloxacin (LEVAQUIN) 750 MG tablet Take 750 mg by mouth daily. For 14 days.    Marland Kitchen lisinopril (ZESTRIL) 20 MG tablet Take 20 mg by mouth daily.    Marland Kitchen oxyCODONE (ROXICODONE) 15 MG immediate release tablet Take 15 mg by mouth 3 (three) times daily.    . pioglitazone (ACTOS) 45 MG tablet Take 45 mg by mouth daily.    . promethazine (PHENERGAN) 25 MG tablet Take 25 mg by mouth 2 (two) times daily as needed for nausea or vomiting.      Past Medical History:  Diagnosis Date  . Arthritis   . CHF (congestive heart failure) (HCC)   . Depression   . Diabetes mellitus without complication (HCC)   . Hard of hearing   . Hyperlipidemia   . Hypertension     family history includes Heart disease in his mother; Hypertension in his mother.  Social History   Occupational History  . Not on file   Tobacco Use  . Smoking status: Current Every Day Smoker    Packs/day: 2.50    Types: Cigarettes  . Smokeless tobacco: Never Used  Substance and Sexual Activity  . Alcohol use: Not on file  . Drug use: Yes    Frequency: 7.0 times per week    Types: Marijuana  . Sexual activity: Not on file    Allergies  Allergen Reactions  . Empagliflozin     Other reaction(s): diarrhea  . 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 Other     Other reaction(s): Unknown  . Simvastatin     Other reaction(s): diarrhea  . Sitagliptin     Other reaction(s): diarrhea/abd pain    EXAM  Mental Status: nonverbal; unable to follow commands or answer Y/N questions   Base Exam  OD  OS   VA (near card)  blinks to light; unable to follow commands or count fingers Blinks to light; unable to follow commands or count fingers  Pupils  Round; sluggish rxn; 5-55mm; no rAPD Round; sluggish rxn; 5-39mm; no rAPD  IOP  16 mmHg 20 mmHg  Motility  Full Full  External  normal Normal    Anterior Exam  OD  OS   Lids / Lashes  Normal Normal  Conj / Sclera  White; quiet  White; quiet  Cornea  Clear Clear  Ant Chamber  Deep Deep  Iris Normal Normal  Lens +NS +NS    Posterior Exam  OD  OS   Vitreous   Clear; PVD; tr vit condensations; no fungal balls  Clear; no fungal balls  Disc  Pink and sharp; compact; c/d 0.1  Pink and sharp; compact; c/d 0.1   Macula  Flat; blunted foveal reflex; no heme or edema Flat; blunted foveal reflex; no heme or edema  Vessels  Mild attenuation Mild attenuation  Periphery  Limited view due to poor cooperation; grossly attached; no heme Limited view due to poor cooperation; grossly attached; no heme     Assessment/Plan:  1. 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 Diseases & Surgery of the Retina and Vitreous [facility]

## 2019-08-28 NOTE — Progress Notes (Signed)
11 Days Post-Op Procedure(s) (LRB): REMOVAL OF IMPELLA LEFT VENTRICULAR ASSIST DEVICE (N/A) TRANSESOPHAGEAL ECHOCARDIOGRAM (TEE) (N/A) Subjective: On trach collar this AM, sleeping  Objective: Vital signs in last 24 hours: Temp:  [97.9 F (36.6 C)-99.5 F (37.5 C)] 97.9 F (36.6 C) (07/20 0750) Pulse Rate:  [80-94] 86 (07/20 0600) Cardiac Rhythm: Normal sinus rhythm (07/20 0400) Resp:  [17-27] 18 (07/20 0600) BP: (51-160)/(28-134) 51/32 (07/20 0600) SpO2:  [97 %-100 %] 100 % (07/20 0748) Arterial Line BP: (96-116)/(42-51) 113/50 (07/20 0600) FiO2 (%):  [40 %] 40 % (07/20 0748) Weight:  [70.4 kg] 70.4 kg (07/20 0210)  Hemodynamic parameters for last 24 hours: CVP:  [2 mmHg-37 mmHg] 6 mmHg  Intake/Output from previous day: 07/19 0701 - 07/20 0700 In: 1203.9 [I.V.:721; NG/GT:150; IV Piggyback:332.9] Out: 2855 [Urine:1555; Drains:85; Stool:575; Chest Tube:640] Intake/Output this shift: No intake/output data recorded.  General appearance: no distress Heart: regular rate and rhythm Lungs: rhonchi bilaterally Abdomen: normal findings: spleen non-palpable Extremities: tip of right index finger is black, otherwise well perfused Wound: clean, intact  Lab Results: Recent Labs    08/27/19 0218 08/28/19 0234  WBC 29.1* 24.2*  HGB 8.5* 7.4*  HCT 29.4* 26.0*  PLT 380 362   BMET:  Recent Labs    08/27/19 1600 08/28/19 0234  NA 144 149*  K 4.0 4.0  CL 112* 113*  CO2 24 24  GLUCOSE 220* 77  BUN 43* 40*  CREATININE 1.08 1.08  CALCIUM 8.1* 7.6*    PT/INR: No results for input(s): LABPROT, INR in the last 72 hours. ABG    Component Value Date/Time   PHART 7.519 (H) 08/23/2019 1805   HCO3 41.8 (H) 08/23/2019 1805   TCO2 43 (H) 08/23/2019 1805   ACIDBASEDEF 1.0 08/08/2019 1115   O2SAT 57.8 08/28/2019 0234   CBG (last 3)  Recent Labs    08/27/19 2008 08/27/19 2345 08/28/19 0512  GLUCAP 206* 116* 81    Assessment/Plan: S/P Procedure(s) (LRB): REMOVAL OF  IMPELLA LEFT VENTRICULAR ASSIST DEVICE (N/A) TRANSESOPHAGEAL ECHOCARDIOGRAM (TEE) (N/A) -remains critically ill Cv- maintaining Sr  Still on milrinone 0.125, levophed being titrated per BP  Co-ox better this AM at 58 RESP- VDRF- on trach collar currently RENAL- creatinine normal, hypernatremic this AM ENDO- CBG 81-206, continue SSI Severe protein calorie malnutrition- on goal TF GI- tolerating TF  LFTs trending down post perc chole placement ID- afebrile, WBC down from 29 to 24 K, on Zosyn, Fluconazole Deconditioning - severe    LOS: 33 days    [name] 08/28/2019

## 2019-08-28 NOTE — Progress Notes (Signed)
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+  Labs: Recent Labs    08/26/19 0228 08/26/19 1727 08/27/19 0218 08/27/19 1600 08/28/19 0234 08/28/19 0400  HGB 8.7*  --  8.5*  --  7.4*  --   HCT 29.3*  --  29.4*  --  26.0*  --   PLT 406*  --  380  --  362  --   APTT 74*  --  72*  --  120* 76*  CREATININE 1.45*   < > 1.09 1.08 1.08  --    < > = values in this interval not displayed.    Assessment: 64 yo male s/p ECMO and Impella. Chest closed 7/2. CT drainage decreased.    HIT ab sent a few days ago and returned as 2.6 - positive, switched from heparin to bivalirudin. SRA positive, confirmed for HIT.  APTT (76) remains therapeutic on bivalirudin 0.055 mg/kg/hr. Hgb down from 8.5 to 7.4 today, platelets stable 362. No bleeding noted per RN.   Goals of Therapy: APTT 50-85 secs  Plan: Continue bivalirudin at 0.055 mg/kg/hr Monitor daily aPTT, CBC, and for s/sx of bleeding  Danae Orleans, PharmD, BCPS Phone [phone] 08/28/2019       7:13 AM  Please check [email or web address] for unit-specific pharmacist phone numbers

## 2019-08-28 NOTE — Progress Notes (Signed)
CCM made aware of temp 101.5. new orders received will continue to monitor.

## 2019-08-28 NOTE — Progress Notes (Signed)
NAME:  Todd [name], MRN:  [id number], DOB:  27-Mar-1955, LOS: 81 ADMISSION DATE:  07/26/2019, CONSULTATION DATE:  08/03/19 REFERRING MD:  Roxan Hockey, CHIEF COMPLAINT:  ECMO   Brief History   VA ECMO for post CABG Vtach arrest  History of present illness   Presented with worsening dyspnea 6/17 c/w CHF exacerbation Cath with severe triple vessel disease Underwent CABG 6/24 Vtach arrest 6/25 Chest opened bedside and cardiac massage initiated as well as multiple cardioversions, amiodarone, bicarb etc Brought to OR and cannulated for VA ECMO PCCM consulted to assist with management Comorbidities include DM, heavy smoking, COPD  Past Medical History  Depression Ischemic cardiomyopathy HTN [place] [facility] Events   6/24 > CABG 6/25 > V. fib arrest with open chest at bedside followed by VA cannulation 6/28 > decannulated and impella placed 6/29 > bedside re-exploration; s/p decannulation. Placement of impella device originally at p8 but decreased to p4 2/2 suction events overnight up to p6. Echo completed and repositioned device. Remained on considerable support with 8 epi and 46 norepi vaso 0.05. continued chest tube output with large clots noted. Heparin thru device. Replacement products ongoing. 60% 8 peep 6/30 > bedside mediastinal re-exploration and clean out of hematoma with tamponade and worsening hemodynamics. Pt had large volume transfusion. rebolused with amio 2/2 nsvt episode.  Weight up 48 pounds.  Started diuresis, Lasix drip started. 7/1 > iatrogenic respiratory alkalosis, vent rate decreased. 7/2 > No significant issues overnight remains on pressors and Impella not tolerating tube feeds with high gastric output awaiting core track placement 7/3 > started trickle TF  7/5 > Swab removed, CVL and PICC placed. 7/13 > Trach and BAL 7/16 > No events. Awake this AM and tracking. Still won't follow commands for me. Still having fevers. Successful ultrasound and CT guided  placement of a 10.2 French cholecystostomy tube. A small amount of aspirated bile was capped and sent to the laboratory for analysis 7/17 > off sedation gtt. doing pressure support ventilation.  Beginning to follow some commands occasionally intermittently.  Very jaundiced.  Growing Candida in the blood and BAL.  Still febrile but T-max is coming down.  White count persistently elevated; currently at 19.4 okay.  Diflucan started. Currently on milrinone GTT, amiodarone gtt., vasopressin gtt., Levophed gtt. and bivalrudin gtt. Off epi gtt. Cards planning lasix x 1 today. 7/20 >    Consults:  Advanced heart failure, PCCM, TCTS  Procedures:  See below  Significant Diagnostic Tests:  Spirometry 6/22 > With restrictive physiology, preserved FEV1/FVC CXR today with bilateral airspace disease, cannulas in good position, deep left sulcus that was present yesterday, no clear PTX Echo 6/18 > LVEF 46-96%, grade I diastolic dysfunction US Abdomen 7/7 > cholelithiasis without cholecystitis. Mild wall thinking in setting of liver disease and ascites.   Micro Data:  COVID and HIV Neg 7/3 respiratory-few Candida tropicalis 7/5 blood>> few Candida tropicalis 7/5 respiratory-few Candida tropicalis 7/7 urine-NG 7/7 blood>>NG 7/9 wound>> NG 7/12 BAL> Candida tropicalis detected 08/22/2019: Blood BC ID Candida parapsilosis detected  Antimicrobials:  Vanc6/24>>7/13 Cefuroxime 6/24>> 6/25 merrem 6/26->7/14 Anidulafungin 7/8->7/14 Zosyn 7/14>> Diflucan 08/25/2019 >>  Interim history/subjective:  No acute events overnight Net -19 L   Objective   Blood pressure (!) 51/32, pulse 86, temperature 98.4 F (36.9 C), temperature source Rectal, resp. rate 18, height _0  (1.753 m), weight 70.4 kg, SpO2 100 %. CVP:  [2 mmHg-37 mmHg] 6 mmHg  Vent Mode: PRVC FiO2 (%):  [40 %] 40 %  Set Rate:  [20 bmp] 20 bmp Vt Set:  [420 mL] 420 mL PEEP:  [5 cmH20] 5 cmH20 Plateau Pressure:  [15 cmH20-17 cmH20] 17 cmH20    Intake/Output Summary (Last 24 hours) at 08/28/2019 8786 Last data filed at 08/28/2019 0600 Gross per 24 hour  Intake 1203.88 ml  Output 2855 ml  Net -1651.12 ml   Filed Weights   08/26/19 0450 08/27/19 0412 08/28/19 0210  Weight: 67.9 kg 67.9 kg 70.4 kg   Physical Exam General: Chronically ill appearing very deconditioned middle-aged gentleman lying in bed ATC in no acute distress  HEENT: #8 Shiley cuffed trach midline, MM pink/moist, PERRL, tracheal secretions seen in and around trach Neuro: Alert and appears to track to voice but unable to determine if orientation is present, non-focal CV: s1s2 regular rate and rhythm, no murmur, rubs, or gallops,  PULM:  Bilateral rhonchi, tolerating ATC well, no increased work of breathing  GI: soft, bowel sounds active in all 4 quadrants, non-tender, non-distended, tolerating TF Extremities: warm/dry, no edema  Skin: no rashes or lesions  Resolved Hospital Problem list   N/A  Assessment & Plan:   Critically ill due to cardiogenic/hemorrhagic shock status post CABG  Prolonged VT arrest with known CAD S/p VA ECMO (6/25 > 6/28)  S/p impella placement (6/28> 7/9) Open chest 6/25 > 7/2 -Patient underwent CABG 6/24 but unfortunately suffered a V. fib arrest 625 resulting in open chest with cardiac massage followed by ECMO cannulation -ECMO with EF of 20 to 25% with moderate RV dysfunction P: Cardiothoracic surgery and heart failure team consulting Primary management per CTS Continue inotropic support with milrinone Continue midodrine Continue norepinephrine Trend CVP Follow coox Strict intake and output, currently 19 L net negative Statin on hold due to elevated LFTs Continue aspirin  Acute hypoxic respiratory failure w/ need for mechanical ventilation  S/p tracheostomy -In the setting of cardiac arrest post CABG HCAP -BAL positive with Candida P: Continue ATC trials as tolerated Routine trach care Encourage pulmonary  hygiene Continue IV to Diflucan and Zosyn Follow cultures Mobilize as able  Candida parapsilosis fungemia. -Blood cultures positive for Candida parapsilosis 7/14 P: Infectious disease following, appreciate assistance Continue Zosyn, tentative stop date 7/23 Continue fluconazole Follow cultures Will need PICC line removed once able  Atrial flutter VT -Patient continues to display V. tach/NSVT post prolonged V. tach arrest P: Primary management per cardiology Continue oral amiodarone Telemetry K goal greater than 4, mag goal greater than 2  Hyperbilirubinemia due to acalculous cholecystitis Possible cirrhosis per 7/8 [place]  Status post  percutaneous GB drain  -LFTs downtrending P: Continue biliary drain Supportive care Avoid hepatotoxins Statins on hold  T2DM -Home medication includes glipizide and Actos  P: Home medications on hold Well-controlled on current regiment SSI with to be coverage Long-acting insulin CBG every 4  Acute blood loss anemia and critical illness anemia -Status post mediastinal reexploration x3 Heparin-induced thrombocytopenia -Bivalirudin drip stopped 7/19 P: Trend CBC and platelets Monitor for signs of bleeding Minimize blood draws as able  Acute kidney injury, resolved -Creatinine on admission 1.42 P: Follow renal function / urine output Trend Bmet Avoid nephrotoxins, ensure adequate renal perfusion   Acute encephalopathy due to prolonged sedation -Slowly improving P: Minimize sedation as able Mobilize as able Supportive care  Small left adrenal mass seen on CT scan of the abdomen August 24, 2019 P: Further work-up to complete in the outpatient setting  Moderate malnutrition -Due to acute illness as evidenced by mild fat  depletion and moderate muscle depletion P: Continue tube feeds Protein supplementation Dietary consulted  Right unstageable sacral pressure ulcer P: Pressure alleviating devices Every 2 hours  turns Frequent pericare WOC consulted  Daily Goals Checklist  Pain/Anxiety/Delirium protocol (if indicated): fentanyl as needed only VAP protocol (if indicated): bundle in place.  Respiratory support goals: trach collar trials for 2h bid, rest on PRVC.  Blood pressure target: Continue milrinone, wean norepinephrine to keep MAP >65. Will increase Midodrine DVT prophylaxis: Bivalirudin per pharmacy  Nutrition Status: High nutritional risk. Continue tube feeds.  GI prophylaxis: PPI per tube.  Fluid status goals: generalized edema improving - allow autoregulation.  Urinary catheter: Guide hemodynamic management Central lines: triple lumen PICC, arterial line.  Glucose control: T2 DM with adequate control on current regimen Mobility/therapy needs: PT for ROM Antibiotic de-escalation: Continue empiric Diflucan and Zosyn for biliary coverage.  Home medication reconciliation: on hold Daily labs: CMP, CBC Code Status: Full  Family Communication: per TCTS Disposition: ICU   LABS    PULMONARY Recent Labs  Lab 08/23/19 1805 08/24/19 0335 08/26/19 0228 08/27/19 0227 08/27/19 0438 08/27/19 1100 08/28/19 0234  PHART 7.519*  --   --   --   --   --   --   PCO2ART 52.0*  --   --   --   --   --   --   PO2ART 108  --   --   --   --   --   --   HCO3 41.8*  --   --   --   --   --   --   TCO2 43*  --   --   --   --   --   --   O2SAT 98.0   < > 64.4 47.0 46.1 56.0 57.8   < > = values in this interval not displayed.    CBC Recent Labs  Lab 08/26/19 0228 08/27/19 0218 08/28/19 0234  HGB 8.7* 8.5* 7.4*  HCT 29.3* 29.4* 26.0*  WBC 24.7* 29.1* 24.2*  PLT 406* 380 362    COAGULATION No results for input(s): INR in the last 168 hours.  CARDIAC  No results for input(s): TROPONINI in the last 168 hours. No results for input(s): PROBNP in the last 168 hours.   CHEMISTRY Recent Labs  Lab 08/24/19 1459 08/24/19 1501 08/25/19 0302 08/25/19 0302 08/25/19 0801 08/25/19 1419  08/25/19 1538 08/25/19 1538 08/25/19 1553 08/25/19 1553 08/26/19 0032 08/26/19 0032 08/26/19 0228 08/26/19 0228 08/26/19 1727 08/26/19 1727 08/27/19 0218 08/27/19 0218 08/27/19 1600 08/28/19 0234  NA 150*   < > 151*   < > 152*   < > 150*   < > 151*   < > 147*   < > 148*  --  146*  --  143  --  144 149*  K 3.9   < > 5.7*   < > 3.8   < > 3.8   < > 4.2   < > 4.6   < > 3.8   < > 3.9   < > 4.2   < > 4.0 4.0  CL 106   < > 108   < > 109   < > 109   < > 110   < > 109   < > 109  --  111  --  109  --  112* 113*  CO2 28   < > 30   < > 31   < > 29   < >  24   < > 28   < > 29  --  26  --  25  --  24 24  GLUCOSE 193*   < > 191*   < > 174*   < > 204*   < > 196*   < > 234*   < > 170*  --  185*  --  114*  --  220* 77  BUN 80*   < > 74*   < > 77*   < > 72*   < > 76*   < > 65*   < > 65*  --  51*  --  46*  --  43* 40*  CREATININE 1.87*   < > 1.63*   < > 1.71*   < > 1.57*   < > 1.57*   < > 1.42*   < > 1.45*  --  1.18  --  1.09  --  1.08 1.08  CALCIUM 8.6*   < > 8.2*   < > 8.5*   < > 8.2*   < > 8.5*   < > 8.3*   < > 8.2*  --  8.3*  --  8.1*  --  8.1* 7.6*  MG  --    < > 2.4  --  2.5*  --   --   --   --   --  2.4  --  2.4  --   --   --  2.2  --   --   --   PHOS 6.6*  --   --   --   --   --  4.0  --  4.4  --   --   --   --   --  3.7  --   --   --  3.5  --    < > = values in this interval not displayed.   Estimated Creatinine Clearance: 67.9 mL/min (by C-G formula based on SCr of 1.08 mg/dL).   LIVER Recent Labs  Lab 08/24/19 0335 08/24/19 1459 08/25/19 0302 08/25/19 1538 08/26/19 0228 08/26/19 1727 08/27/19 0218 08/27/19 1600 08/28/19 0234  AST 239*  --  183*  --  166*  --  129*  --  96*  ALT 197*  --  181*  --  163*  --  139*  --  107*  ALKPHOS 554*  --  499*  --  576*  --  496*  --  395*  BILITOT 13.6*  --  12.1*  --  12.0*  --  10.2*  --  8.7*  PROT 6.3*  --  6.7  --  6.1*  --  5.7*  --  6.8  ALBUMIN 2.9*   < > 2.7*   < > 2.3* 2.2* 2.1* 2.2* 1.9*   < > = values in this interval not  displayed.     INFECTIOUS No results for input(s): LATICACIDVEN, PROCALCITON in the last 168 hours.   ENDOCRINE CBG (last 3)  Recent Labs    08/27/19 2008 08/27/19 2345 08/28/19 0512  GLUCAP 206* 116* 81    CRITICAL CARE Performed by: Johnsie Cancel  Total critical care time: 39 minutes  Critical care time was exclusive of separately billable procedures and treating other patients.  Critical care was necessary to treat or prevent imminent or life-threatening deterioration.  Critical care was time spent personally by me on the following activities: development of treatment plan with patient and/or surrogate as well as nursing, discussions with consultants,  evaluation of patient's response to treatment, examination of patient, obtaining history from patient or surrogate, ordering and performing treatments and interventions, ordering and review of laboratory studies, ordering and review of radiographic studies, pulse oximetry and re-evaluation of patient's condition.  Johnsie Cancel, NP-C [facility] Contact / Pager information can be found on Amion  08/28/2019, 8:30 AM

## 2019-08-29 ENCOUNTER — Inpatient Hospital Stay (HOSPITAL_COMMUNITY): Payer: Medicare Other

## 2019-08-29 DIAGNOSIS — Z978 Presence of other specified devices: Secondary | ICD-10-CM

## 2019-08-29 LAB — RENAL FUNCTION PANEL
Albumin: 1.9 g/dL — ABNORMAL LOW (ref 3.5–5.0)
Anion gap: 8 (ref 5–15)
BUN: 55 mg/dL — ABNORMAL HIGH (ref 8–23)
CO2: 24 mmol/L (ref 22–32)
Calcium: 8.5 mg/dL — ABNORMAL LOW (ref 8.9–10.3)
Chloride: 120 mmol/L — ABNORMAL HIGH (ref 98–111)
Creatinine, Ser: 1.16 mg/dL (ref 0.61–1.24)
GFR calc Af Amer: >60 mL/min (ref >60)
GFR calc non Af Amer: >60 mL/min (ref >60)
Glucose, Bld: 218 mg/dL — ABNORMAL HIGH (ref 70–99)
Phosphorus: 3.6 mg/dL (ref 2.5–4.6)
Potassium: 4.7 mmol/L (ref 3.5–5.1)
Sodium: 152 mmol/L — ABNORMAL HIGH (ref 135–145)

## 2019-08-29 LAB — CBC
HCT: 27.7 % — ABNORMAL LOW (ref 39.0–52.0)
Hemoglobin: 8 g/dL — ABNORMAL LOW (ref 13.0–17.0)
MCH: 30.4 pg (ref 26.0–34.0)
MCHC: 28.9 g/dL — ABNORMAL LOW (ref 30.0–36.0)
MCV: 105.3 fL — ABNORMAL HIGH (ref 80.0–100.0)
Platelets: 409 10*3/uL — ABNORMAL HIGH (ref 150–400)
RBC: 2.63 MIL/uL — ABNORMAL LOW (ref 4.22–5.81)
RDW: 24.7 % — ABNORMAL HIGH (ref 11.5–15.5)
WBC: 29.7 10*3/uL — ABNORMAL HIGH (ref 4.0–10.5)
nRBC: 0.1 % (ref 0.0–0.2)

## 2019-08-29 LAB — COMPREHENSIVE METABOLIC PANEL
ALT: 113 U/L — ABNORMAL HIGH (ref 0–44)
AST: 103 U/L — ABNORMAL HIGH (ref 15–41)
Albumin: 2.1 g/dL — ABNORMAL LOW (ref 3.5–5.0)
Alkaline Phosphatase: 449 U/L — ABNORMAL HIGH (ref 38–126)
Anion gap: 9 (ref 5–15)
BUN: 48 mg/dL — ABNORMAL HIGH (ref 8–23)
CO2: 25 mmol/L (ref 22–32)
Calcium: 8.6 mg/dL — ABNORMAL LOW (ref 8.9–10.3)
Chloride: 116 mmol/L — ABNORMAL HIGH (ref 98–111)
Creatinine, Ser: 1.16 mg/dL (ref 0.61–1.24)
GFR calc Af Amer: >60 mL/min (ref >60)
GFR calc non Af Amer: >60 mL/min (ref >60)
Glucose, Bld: 199 mg/dL — ABNORMAL HIGH (ref 70–99)
Potassium: 4.4 mmol/L (ref 3.5–5.1)
Sodium: 150 mmol/L — ABNORMAL HIGH (ref 135–145)
Total Bilirubin: 9 mg/dL — ABNORMAL HIGH (ref 0.3–1.2)
Total Protein: 5.9 g/dL — ABNORMAL LOW (ref 6.5–8.1)

## 2019-08-29 LAB — POCT I-STAT 7, (LYTES, BLD GAS, ICA,H+H)
Acid-Base Excess: 1 mmol/L (ref 0.0–2.0)
Bicarbonate: 25.7 mmol/L (ref 20.0–28.0)
Calcium, Ion: 1.24 mmol/L (ref 1.15–1.40)
HCT: 23 % — ABNORMAL LOW (ref 39.0–52.0)
Hemoglobin: 7.8 g/dL — ABNORMAL LOW (ref 13.0–17.0)
O2 Saturation: 99 %
Patient temperature: 38.2
Potassium: 4.8 mmol/L (ref 3.5–5.1)
Sodium: 153 mmol/L — ABNORMAL HIGH (ref 135–145)
TCO2: 27 mmol/L (ref 22–32)
pCO2 arterial: 40.4 mmHg (ref 32.0–48.0)
pH, Arterial: 7.416 (ref 7.350–7.450)
pO2, Arterial: 132 mmHg — ABNORMAL HIGH (ref 83.0–108.0)

## 2019-08-29 LAB — COOXEMETRY PANEL
Carboxyhemoglobin: 1.8 % — ABNORMAL HIGH (ref 0.5–1.5)
Methemoglobin: 0.9 % (ref 0.0–1.5)
O2 Saturation: 66.1 %
Total hemoglobin: 7.9 g/dL — ABNORMAL LOW (ref 12.0–16.0)

## 2019-08-29 LAB — GLUCOSE, CAPILLARY
Glucose-Capillary: 191 mg/dL — ABNORMAL HIGH (ref 70–99)
Glucose-Capillary: 189 mg/dL — ABNORMAL HIGH (ref 70–99)
Glucose-Capillary: 221 mg/dL — ABNORMAL HIGH (ref 70–99)
Glucose-Capillary: 193 mg/dL — ABNORMAL HIGH (ref 70–99)
Glucose-Capillary: 210 mg/dL — ABNORMAL HIGH (ref 70–99)

## 2019-08-29 LAB — AEROBIC/ANAEROBIC CULTURE W GRAM STAIN (SURGICAL/DEEP WOUND): Culture: NO GROWTH

## 2019-08-29 LAB — MAGNESIUM: Magnesium: 2.3 mg/dL (ref 1.7–2.4)

## 2019-08-29 LAB — APTT: aPTT: 78 seconds — ABNORMAL HIGH (ref 24–36)

## 2019-08-29 MED ORDER — FREE WATER
300.0000 mL | Status: DC
  Administered 2019-08-29 – 2019-08-31 (×11): 300 mL

## 2019-08-29 MED ORDER — MELATONIN 3 MG PO TABS
3.0000 mg | ORAL_TABLET | Freq: Once | ORAL | Status: AC
  Administered 2019-08-29: 3 mg via ORAL
  Filled 2019-08-29: qty 1

## 2019-08-29 NOTE — Progress Notes (Signed)
      [address]       [place] [address]             [phone]      Tachypneic  Febrile to 101.3  New IJ placed, PICC to be removed  Zosyn discontinued  Viviann Spare C. Dorris Fetch, MD [facility] [phone]

## 2019-08-29 NOTE — Progress Notes (Signed)
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    Assessment: 64 yo male s/p ECMO and Impella. Chest closed 7/2. CT drainage decreasing.    HIT ab sent and returned as 2.6 - positive, switched from heparin to bivalirudin. SRA positive, confirmed for HIT.  bivalirudin started 08/16/19 APTT (78sec) remains therapeutic on bivalirudin 0.055 mg/kg/hr (wt 71.7kg). Hgb stable 7-8, platelets stable 300s. No bleeding noted per RN.   Goals of Therapy: APTT 50-85 secs  Plan: Continue bivalirudin at 0.055 mg/kg/hr Monitor daily aPTT, CBC, and for s/sx of bleeding  Leota Sauers Pharm.D. CPP, BCPS Clinical Pharmacist [phone] 08/29/2019 12:26 PM    Please check [email or web address] for unit-specific pharmacist phone numbers

## 2019-08-29 NOTE — Progress Notes (Addendum)
NAME:  Todd [name], MRN:  [id number], DOB:  1955-03-09, LOS: 33 ADMISSION DATE:  07/26/2019, CONSULTATION DATE:  08/03/19 REFERRING MD:  Roxan Hockey, CHIEF COMPLAINT:  ECMO   Brief History   VA ECMO for post CABG Vtach arrest  History of present illness   Presented with worsening dyspnea 6/17 c/w CHF exacerbation Cath with severe triple vessel disease Underwent CABG 6/24 Vtach arrest 6/25 Chest opened bedside and cardiac massage initiated as well as multiple cardioversions, amiodarone, bicarb etc Brought to OR and cannulated for VA ECMO PCCM consulted to assist with management Comorbidities include DM, heavy smoking, COPD  Past Medical History  Depression Ischemic cardiomyopathy HTN [facility] Events   6/24 CABG 6/25 VA cannulation 6/28 decannulated and impella placed 6/29 bedside re-exploration; s/p decannulation. Placement of impella device originally at p8 but decreased to p4 2/2 suction events overnight up to p6. Echo completed and repositioned device. Remained on considerable support with 8 epi and 46 norepi vaso 0.05. continued chest tube output with large clots noted. Heparin thru device. Replacement products ongoing. 60% 8 peep 6/30 bedside mediastinal re-exploration and clean out of hematoma with tamponade and worsening hemodynamics. Pt had large volume transfusion. rebolused with amio 2/2 nsvt episode.  Weight up 48 pounds.  Started diuresis, Lasix drip started. 7/01 iatrogenic respiratory alkalosis, vent rate decreased. 7/02 No significant issues overnight remains on pressors and Impella not tolerating tube feeds with high gastric output awaiting core track placement 7/03 started trickle TF  7/05 Swab removed, CVL and PICC placed. 7/13 Trach and BAL 7/16 Awake this AM and tracking. No follow commands. Still having fevers. Successful ultrasound and CT guided placement of a 10.2 French cholecystostomy tube. A small amount of aspirated bile was capped and  sent to the laboratory for analysis 7/17 Off sedation gtt. doing pressure support ventilation.  Beginning to follow some commands occasionally intermittently.  Very jaundiced.  Growing Candida in the blood and BAL.  Still febrile but T-max is coming down.  WBC persistently elevated; currently at 19.4 okay.  Diflucan started. Currently on milrinone GTT, amiodarone gtt., vasopressin gtt., Levophed gtt. and bivalrudin gtt. Off epi gtt. Cards planning lasix x 1 today. 7/21 Ongoing fevers, WBC increased  Consults:  Advanced heart failure, PCCM, TCTS  Procedures:  LUE PICC 7/5 >> R Brachial ALine 7/9 >> Chest Tubes >>  Significant Diagnostic Tests:  6/22 spirometry with restrictive physiology, preserved FEV1/FVC 6/18 echo: LVEF 88-91%, grade I diastolic dysfunction 6/94 US Abdomen cholelithiasis without cholecystitis. Mild wall thinking in setting of liver disease and ascites.  7/20 CT ABD w contrast >> stable position of the percutaneous cholecystostomy tube with complete decompression of the gallbladder, trace residual free fluid within the RUQ, decreased since prior, trace bilateral pleural effusions L>R, scattered areas of ground glass airspace disease within the RML, RLL  Micro Data:  COVID and HIV 6/17 >> Neg 7/3 respiratory >> few Candida tropicalis 7/5 blood >> negative  7/5 respiratory >> few Candida tropicalis 7/7 urine >> negative 7/7 blood >> negative  7/9 wound >> negative 7/12 BAL >> candida tropicalis, candida albicans 7/14 BCID >> candida parapsilosis   7/14 BCx2 >> candida parapsilosis  7/17 BCx2 >>  7/21 BCx2 >>   Antimicrobials:  Vanc 6/24>>7/13 Cefuroxime 6/24 >> 6/25 merrem 6/26 >> 7/14 Anidulafungin 7/8 >> 7/14 Zosyn 7/14 >> Diflucan 7/17 >>  Interim history/subjective:  Tmax 101.5 / WBC increased to 29.7 Remains on vent, 40% I/O 2.7L UOP, 416m stool, -2.9L in  last 24 hours  RN reports levophed weaned to 10 mcg, remains on milrinone, bivalirudin    Objective   Blood pressure (!) 85/73, pulse 86, temperature (!) 100.9 F (38.3 C), resp. rate (!) 26, height _0  (1.753 m), weight 66 kg, SpO2 100 %. CVP:  [7 mmHg-10 mmHg] 7 mmHg  Vent Mode: PRVC FiO2 (%):  [10 %-40 %] 40 % Set Rate:  [20 bmp] 20 bmp Vt Set:  [420 mL] 420 mL PEEP:  [5 cmH20] 5 cmH20 Plateau Pressure:  [15 cmH20-17 cmH20] 15 cmH20   Intake/Output Summary (Last 24 hours) at 08/29/2019 0816 Last data filed at 08/29/2019 0700 Gross per 24 hour  Intake 1291.18 ml  Output 4065 ml  Net -2773.82 ml   Filed Weights   08/27/19 0412 08/28/19 0210 08/29/19 0438  Weight: 67.9 kg 70.4 kg 66 kg   Exam General: ill appearing adult male sitting up in bed in NAD on vent, jaundice  HEENT: MM pink/moist, #6 trach midline c/d/i, icterus Neuro: eyes open, tracks provider, intermittently will follow simple commands, generalized weakness  CV: s1s2 RRR, SR on monitor, no m/r/g, midline sternal wound with staples, chest tubes x2 PULM: non-labored on vent, lungs bilaterally coarse GI: soft, bsx4 active, cortrak in place  Extremities: warm/dry, no edema  Skin: no rashes or lesions on exposed skin.  Sacral decubitus approximately 3x3 inch site   Resolved Hospital Problem list     Assessment & Plan:   Critically ill due to cardiogenic/hemorrhagic shock status post CABG complicated by VT arrest, S/p VA ECMO (decanulated 6/28). S/p impella placement (6/28> 7/9) Atrial flutter VT Critically ill due to acute hypoxic respiratory failure w/ need for mechanical ventilation - status post tracheostomy Deconditioning of Critical Illness  Hyperbilirubinemia due to acalculous cholecystitis Possible cirrhosis per 7/8 [place]  Status post  percutaneous GB drain  T2DM Candida parapsilosis fungemia. Acute blood loss anemia and critical illness anemia Heparin-induced thrombocytopenia Acute kidney injury Hypernatremia / Hyperchloremia  Acute encephalopathy due to prolonged sedation Sacral  Decubitus  Small left adrenal mass seen on CT scan of the abdomen August 24, 2019  Plan:  -adjust free water to 300 ml Q4 -follow co-ox, pressor needs (improving) -wean vasopressors for MAP >65 -continue midodrine  -milrinone per Cardiology  -continue amiodarone, ASA, angiomax for HIT  -PRVC as rest mode, ATC during daytime as goal for now  -follow intermittent CXR  -CT Abd reviewed, no additional source infection.  GB decompressed, suspect this was likely source of fungemia  -appreciate IR assistance with GB drain, continue until bilirubin normalizes -continue zosyn, stop date 7/21 -continue diflucan -plan to remove PICC given fungemia and place temporary central line  -remove foley  -consider also sinus disease as source of infection given feeding tube -repeat blood cultures, plan for one peripheral stick and one from central line during insertion   Daily Goals Checklist  Pain/Anxiety/Delirium protocol (if indicated): fentanyl as needed only VAP protocol (if indicated): bundle in place.  Respiratory support goals: trach collar trials for 2h bid, rest on PRVC.  Blood pressure target: Continue milrinone, wean norepinephrine to keep MAP >65. Will increase Midodrine DVT prophylaxis: Bivalirudin per pharmacy  Nutrition Status: High nutritional risk. Continue tube feeds.  GI prophylaxis: PPI per tube.  Fluid status goals: generalized edema improving - allow autoregulation.  Urinary catheter: discontinue foley  Central lines: triple lumen PICC, arterial line.  Glucose control: T2 DM with adequate control on current regimen Mobility/therapy needs: PT for ROM Antibiotic de-escalation: Continue  empiric Diflucan and Zosyn for biliary coverage.  Home medication reconciliation: on hold Daily labs: CMP, CBC Code Status: Full  Family Communication: per TCTS Disposition: ICU   LABS    PULMONARY Recent Labs  Lab 08/23/19 1805 08/24/19 0335 08/27/19 0227 08/27/19 0438 08/27/19 1100  08/28/19 0234 08/29/19 0222  PHART 7.519*  --   --   --   --   --   --   PCO2ART 52.0*  --   --   --   --   --   --   PO2ART 108  --   --   --   --   --   --   HCO3 41.8*  --   --   --   --   --   --   TCO2 43*  --   --   --   --   --   --   O2SAT 98.0   < > 47.0 46.1 56.0 57.8 66.1   < > = values in this interval not displayed.    CBC Recent Labs  Lab 08/27/19 0218 08/28/19 0234 08/29/19 0222  HGB 8.5* 7.4* 8.0*  HCT 29.4* 26.0* 27.7*  WBC 29.1* 24.2* 29.7*  PLT 380 362 409*    COAGULATION No results for input(s): INR in the last 168 hours.  CARDIAC  No results for input(s): TROPONINI in the last 168 hours. No results for input(s): PROBNP in the last 168 hours.   CHEMISTRY Recent Labs  Lab 08/25/19 0801 08/25/19 1419 08/25/19 1538 08/25/19 1538 08/25/19 1553 08/25/19 1553 08/26/19 0032 08/26/19 0032 08/26/19 0228 08/26/19 0228 08/26/19 1727 08/26/19 1727 08/27/19 0218 08/27/19 0218 08/27/19 1600 08/27/19 1600 08/28/19 0234 08/28/19 0234 08/28/19 1700 08/29/19 0222  NA 152*   < > 150*   < > 151*   < > 147*   < > 148*   < > 146*   < > 143  --  144  --  149*  --  148* 150*  K 3.8   < > 3.8   < > 4.2   < > 4.6   < > 3.8   < > 3.9   < > 4.2   < > 4.0   < > 4.0   < > 3.9 4.4  CL 109   < > 109   < > 110   < > 109   < > 109   < > 111   < > 109  --  112*  --  113*  --  115* 116*  CO2 31   < > 29   < > 24   < > 28   < > 29   < > 26   < > 25  --  24  --  24  --  25 25  GLUCOSE 174*   < > 204*   < > 196*   < > 234*   < > 170*   < > 185*   < > 114*  --  220*  --  77  --  193* 199*  BUN 77*   < > 72*   < > 76*   < > 65*   < > 65*   < > 51*   < > 46*  --  43*  --  40*  --  43* 48*  CREATININE 1.71*   < > 1.57*   < > 1.57*   < > 1.42*   < >  1.45*   < > 1.18   < > 1.09  --  1.08  --  1.08  --  1.13 1.16  CALCIUM 8.5*   < > 8.2*   < > 8.5*   < > 8.3*   < > 8.2*   < > 8.3*   < > 8.1*  --  8.1*  --  7.6*  --  7.8* 8.6*  MG 2.5*  --   --   --   --   --  2.4  --  2.4  --    --   --  2.2  --   --   --   --   --   --  2.3  PHOS  --   --  4.0  --  4.4  --   --   --   --   --  3.7  --   --   --  3.5  --   --   --  3.8  --    < > = values in this interval not displayed.   Estimated Creatinine Clearance: 59.3 mL/min (by C-G formula based on SCr of 1.16 mg/dL).   LIVER Recent Labs  Lab 08/25/19 0302 08/25/19 1538 08/26/19 0228 08/26/19 1727 08/27/19 0218 08/27/19 1600 08/28/19 0234 08/28/19 1700 08/29/19 0222  AST 183*  --  166*  --  129*  --  96*  --  103*  ALT 181*  --  163*  --  139*  --  107*  --  113*  ALKPHOS 499*  --  576*  --  496*  --  395*  --  449*  BILITOT 12.1*  --  12.0*  --  10.2*  --  8.7*  --  9.0*  PROT 6.7  --  6.1*  --  5.7*  --  6.8  --  5.9*  ALBUMIN 2.7*   < > 2.3*   < > 2.1* 2.2* 1.9* 2.0* 2.1*   < > = values in this interval not displayed.     INFECTIOUS No results for input(s): LATICACIDVEN, PROCALCITON in the last 168 hours.   ENDOCRINE CBG (last 3)  Recent Labs    08/28/19 1947 08/28/19 2355 08/29/19 0738  GLUCAP 152* 143* 189*    CRITICAL CARE   Critical Care Time: 62 minutes    Noe Gens, MSN, NP-C [facility] 08/29/2019, 8:16 AM   Please see [email or web address] for pager details.

## 2019-08-29 NOTE — Progress Notes (Signed)
12 Days Post-Op Procedure(s) (LRB): REMOVAL OF IMPELLA LEFT VENTRICULAR ASSIST DEVICE (N/A) TRANSESOPHAGEAL ECHOCARDIOGRAM (TEE) (N/A) Subjective: Awake but less interactive today Spiked fever again yesterday/ overnight  Objective: Vital signs in last 24 hours: Temp:  [98.1 F (36.7 C)-101.5 F (38.6 C)] 100.9 F (38.3 C) (07/21 0845) Pulse Rate:  [83-109] 86 (07/21 0845) Cardiac Rhythm: Normal sinus rhythm (07/21 0000) Resp:  [15-35] 28 (07/21 0845) BP: (76-132)/(38-103) 94/68 (07/21 0800) SpO2:  [96 %-100 %] 100 % (07/21 0845) Arterial Line BP: (90-126)/(39-60) 100/49 (07/21 0845) FiO2 (%):  [10 %-40 %] 40 % (07/21 0731) Weight:  [66 kg] 66 kg (07/21 0438)  Hemodynamic parameters for last 24 hours: CVP:  [7 mmHg-10 mmHg] 8 mmHg  Intake/Output from previous day: 07/20 0701 - 07/21 0700 In: 1291.2 [I.V.:786.2; NG/GT:150; IV Piggyback:355] Out: 4290 [Urine:2795; Drains:60; Stool:475; Chest Tube:960] Intake/Output this shift: Total I/O In: 7038.9 [I.V.:22.5; NG/GT:7003.8; IV Piggyback:12.7] Out: -   General appearance: toxic Neurologic: opens eyes and looks around, nt following commands Heart: regular rate and rhythm Lungs: clear to auscultation bilaterally Abdomen: nondistended Wound: sacral wound with skin breakdown  Lab Results: Recent Labs    08/28/19 0234 08/29/19 0222  WBC 24.2* 29.7*  HGB 7.4* 8.0*  HCT 26.0* 27.7*  PLT 362 409*   BMET:  Recent Labs    08/28/19 1700 08/29/19 0222  NA 148* 150*  K 3.9 4.4  CL 115* 116*  CO2 25 25  GLUCOSE 193* 199*  BUN 43* 48*  CREATININE 1.13 1.16  CALCIUM 7.8* 8.6*    PT/INR: No results for input(s): LABPROT, INR in the last 72 hours. ABG    Component Value Date/Time   PHART 7.519 (H) 08/23/2019 1805   HCO3 41.8 (H) 08/23/2019 1805   TCO2 43 (H) 08/23/2019 1805   ACIDBASEDEF 1.0 08/08/2019 1115   O2SAT 66.1 08/29/2019 0222   CBG (last 3)  Recent Labs    08/28/19 2355 08/29/19 0421 08/29/19 0738   GLUCAP 143* 191* 189*    Assessment/Plan: S/P Procedure(s) (LRB): REMOVAL OF IMPELLA LEFT VENTRICULAR ASSIST DEVICE (N/A) TRANSESOPHAGEAL ECHOCARDIOGRAM (TEE) (N/A) -remains critically ill CV- still on milrinone and levophed although levophed down to 10 mcg/min RESP- VDRF, plan per CCM RENAL- creatinine stable but hypernatremia a little worse today ENDO- CBG moderate elevated ID- spiked a fever again- on Zosyn and Diflucan  CT abdomen showed gall bladder is decompressed, basilar lung consolidation, tiny left effusion- no obvious source  Will dc PICC line and culture, still needs central access for inotropes GI- LFTs about the same, tolerating TF  Prognosis remains guarded  Plan discussed with Advanced heart Failure and CCM daily  LOS: 34 days    [name] 08/29/2019

## 2019-08-29 NOTE — Progress Notes (Signed)
Pharmacy Antibiotic Note  Todd [name] is a 64 y.o. male admitted on 07/26/2019 with CHF exacerbation. 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 VT arrest on 6/25 and underwent VA ECMO. Decannulated on 6/28 and Impella placed. Chest closed on 7/2 and Imella removed 7/9. Cholecystostomy tube placed on 7/16.  Was on antibiotics initially for possible HCAP. No definite cholecystitis was seen on abd [place]. Now with candida in blood cultures and BAL. No growth from aspirated bile from perc-chole drain. Pharmacy has been consulted for fluconazole for candidemiia and Zosyn per MD for possible acalculous cholecystitis.  Plan: Zosyn 3.375g IV q8h (4 hour infusion). Continue through 7/23 per ID (tentatively - no stop date added yet) Fluconazole 400 mg IV q24h  Follow cultures, renal function, needs fundoscopic exam  Height: _0  (175.3 cm) Weight: 66 kg (145 lb 8.1 oz) IBW/kg (Calculated) : 70.7  Temp (24hrs), Avg:100.8 F (38.2 C), Min:98.5 F (36.9 C), Max:101.5 F (38.6 C)  Recent Labs  Lab 08/25/19 0302 08/25/19 0801 08/26/19 0228 08/26/19 1727 08/27/19 0218 08/27/19 1600 08/28/19 0234 08/28/19 1700 08/29/19 0222  WBC 19.4*  --  24.7*  --  29.1*  --  24.2*  --  29.7*  CREATININE 1.63*   < > 1.45*   < > 1.09 1.08 1.08 1.13 1.16   < > = values in this interval not displayed.    Estimated Creatinine Clearance: 59.3 mL/min (by C-G formula based on SCr of 1.16 mg/dL).    Allergies  Allergen Reactions  . Empagliflozin     Other reaction(s): diarrhea  . 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 Other     Other reaction(s): Unknown  . Simvastatin     Other reaction(s): diarrhea  . Sitagliptin     Other reaction(s): diarrhea/abd pain    Antimicrobials this admission: Zosyn 7/14>> Fluconazole 7/17>> Cefuroxime 6/23>>6/25 Vancomycin 6/24>6/24; resume 6/26>>7/2, 7/5>>7/13 Meropenem 6/26>>7/4, 7/5>>7/14 [name] 7/7>>7/14  Dose adjustments  thisdosing regimen: 6/30 VR 33, 7/1 22 (ke 0.026, T1/2 26.6) - change to vancomycin 1750 mg q36 7/7 VT = 19 (drawn prior to 3rd dose) - reduce to 1g IV every 24 hours 7/11 VT 21 - reduce to 750 q 24, retime for later tonight>start 7/12 AM  Microbiology results: 7/3+7/5 TA - few yeast> few candida topicalis  7/5 + 7/7 Bcx: neg 7/7 Ucx: neg 7/9 Wound Cx: neg 7/12 BAL: candida albicans, candida tropicalis 7/14 pleural fluid: rare candida parapsilosis 7/14 Blood>>>candida parapsilosis  7/16 abscess: ngtd 7/17 BCx: ngtd  Thank you for allowing pharmacy to be a part of this patient's care.  Vertis Kelch, PharmD, BCPS Phone [phone] 08/29/2019       12:09 PM  Please check [email or web address] for unit-specific pharmacist phone numbers

## 2019-08-29 NOTE — Procedures (Signed)
Central Venous Catheter Insertion Procedure Note Todd [name] [id number] 1955-11-29  Procedure: Insertion of Central Venous Catheter Indications: Assessment of intravascular volume, Drug and/or fluid administration and Frequent blood sampling  Procedure Details Consent: Risks of procedure as well as the alternatives and risks of each were explained to the (patient/caregiver).  Consent for procedure obtained. Time Out: Verified patient identification, verified procedure, site/side was marked, verified correct patient position, special equipment/implants available, medications/allergies/relevent history reviewed, required imaging and test results available.  Performed  Maximum sterile technique was used including antiseptics, cap, gloves, gown, hand hygiene, mask and sheet. Skin prep: Chlorhexidine; local anesthetic administered A antimicrobial bonded/coated triple lumen catheter was placed in the left internal jugular vein using the Seldinger technique.  Evaluation Blood flow good Complications: No apparent complications Patient did tolerate procedure well. Chest X-ray ordered to verify placement.  CXR: pending.  Todd [name] 08/29/2019, 4:15 PM

## 2019-08-29 NOTE — Progress Notes (Signed)
SLP Cancellation Note  Patient Details Name: Todd [name] MRN: [id number] DOB: 1955/07/27   Cancelled treatment:       Reason Eval/Treat Not Completed: Medical issues which prohibited therapy (Case discussed with RT who indicated that pt is not ready for weaning to trach collar at this time and possibly not in the near future. Pt is not ready for in-line Passy-Muir Speaking Valve today but SLP will follow for readiness as clinically indicated.)  Bassam Dresch I. Vear Clock, MS, CCC-SLP Acute Rehabilitation Services Office number [phone] Pager [phone]  Scheryl Marten 08/29/2019, 9:03 AM

## 2019-08-29 NOTE — Progress Notes (Signed)
Patient ID: Todd [name], male   DOB: 1955-07-07, 64 y.o.   MRN: [id number]     Advanced Heart Failure Rounding Note  PCP-Cardiologist: No primary care provider on file.   Subjective:    08/02/19 CABG 08/03/19 VF arrest. Chest open at bedside 08/03/19 Back to OR for ECMO cannulation 08/03/19 Washout out for tamponade 08/04/19 Repeat bedside washout for tamponade 08/06/19 To OR for decannulation and Impella 5.5 08/07/19 Underwent emergent bedside chest exploration at bedside for tamponade  08/10/19 Chest closed 08/16/19 HIT positive, bivalirudin begun 08/17/19 Impella removed 08/20/19 Tracheostomy 08/23/19 HIDA scan showed patent common bile duct (unable to visualize GB, unable to rule out acalculous cholecystitis).  08/24/19 cholecystostomy tube  Remains on Milrinone 0.125, NE down to 10. Off VP. Midodrine 15 mg tid.  Co-ox 66%. MAPs stable.  CVP 7. Creatinine stable 1.16, got 1 dose IV Lasix yesterday.   NSR on po amiodarone.    Meropenem and [name] stopped 7/14, Zosyn started.  Now with Candida from BAL and blood culture, started on Diflucan.  WBCs 19>>24>>29>>24>>30. Febrile to 101.1.  Abdominal CT 7/20 with no definite source.   Now getting tube feeds via post-pyloric tube.   Jaundiced with consistently elevated bilirubin (direct), tbili stable at 9 today.  Abdominal US with gallbladder sludge, no definite cholecystitis.  HIDA scan with patent CBD but no gallbladder activity. Abdominal CT with gallbladder sludge. Now s/p cholecystostomy tube.   Objective:   Weight Range: 66 kg Body mass index is 21.49 kg/m.   Vital Signs:   Temp:  [98.1 F (36.7 C)-101.5 F (38.6 C)] 100.9 F (38.3 C) (07/21 0845) Pulse Rate:  [83-109] 86 (07/21 0845) Resp:  [15-35] 28 (07/21 0845) BP: (76-132)/(38-103) 94/68 (07/21 0800) SpO2:  [96 %-100 %] 100 % (07/21 0845) Arterial Line BP: (90-126)/(39-60) 100/49 (07/21 0845) FiO2 (%):  [10 %-40 %] 40 % (07/21 0731) Weight:  [66 kg] 66 kg (07/21 0438) Last  BM Date: 08/28/19  Weight change: Filed Weights   08/27/19 0412 08/28/19 0210 08/29/19 0438  Weight: 67.9 kg 70.4 kg 66 kg    Intake/Output:   Intake/Output Summary (Last 24 hours) at 08/29/2019 0902 Last data filed at 08/29/2019 0801 Gross per 24 hour  Intake 8229.97 ml  Output 4065 ml  Net 4164.97 ml      Physical Exam    General: Awake on vent via trach Neck: Tracheostomy, no thyromegaly or thyroid nodule.  Lungs: Decreased dependently CV: Nondisplaced PMI.  Heart regular S1/S2, no S3/S4, no murmur.  No peripheral edema.   Abdomen: Soft, nontender, no hepatosplenomegaly, no distention.  Skin: Jaundiced.  Neurologic: Follows commands per nurse Extremities: No clubbing or cyanosis.  HEENT: Normal.    Telemetry   NSR 70s.  Personally reviewed   Labs    CBC Recent Labs    08/28/19 0234 08/29/19 0222  WBC 24.2* 29.7*  HGB 7.4* 8.0*  HCT 26.0* 27.7*  MCV 102.4* 105.3*  PLT 362 147*   Basic Metabolic Panel Recent Labs    08/27/19 0218 08/27/19 0218 08/27/19 1600 08/28/19 0234 08/28/19 1700 08/29/19 0222  NA 143   < > 144   < > 148* 150*  K 4.2   < > 4.0   < > 3.9 4.4  CL 109   < > 112*   < > 115* 116*  CO2 25   < > 24   < > 25 25  GLUCOSE 114*   < > 220*   < > 193*  199*  BUN 46*   < > 43*   < > 43* 48*  CREATININE 1.09   < > 1.08   < > 1.13 1.16  CALCIUM 8.1*   < > 8.1*   < > 7.8* 8.6*  MG 2.2  --   --   --   --  2.3  PHOS  --   --  3.5  --  3.8  --    < > = values in this interval not displayed.   Liver Function Tests Recent Labs    08/28/19 0234 08/28/19 0234 08/28/19 1700 08/29/19 0222  AST 96*  --   --  103*  ALT 107*  --   --  113*  ALKPHOS 395*  --   --  449*  BILITOT 8.7*  --   --  9.0*  PROT 6.8  --   --  5.9*  ALBUMIN 1.9*   < > 2.0* 2.1*   < > = values in this interval not displayed.   No results for input(s): LIPASE, AMYLASE in the last 72 hours. Cardiac Enzymes No results for input(s): CKTOTAL, CKMB, CKMBINDEX, TROPONINI in  the last 72 hours.  BNP: BNP (last 3 results) Recent Labs    07/26/19 1236  BNP 2,568.2*    ProBNP (last 3 results) No results for input(s): PROBNP in the last 8760 hours.   D-Dimer No results for input(s): DDIMER in the last 72 hours. Hemoglobin A1C No results for input(s): HGBA1C in the last 72 hours. Fasting Lipid Panel Recent Labs    08/27/19 0218  TRIG 213*   Thyroid Function Tests No results for input(s): TSH, T4TOTAL, T3FREE, THYROIDAB in the last 72 hours.  Invalid input(s): FREET3  Other results:   Imaging    CT ABDOMEN W CONTRAST  Result Date: 08/28/2019 CLINICAL DATA:  Fungemia, respiratory failure, acute cholecystitis with percutaneous cholecystostomy EXAM: CT ABDOMEN WITH CONTRAST TECHNIQUE: Multidetector CT imaging of the abdomen was performed using the standard protocol following bolus administration of intravenous contrast. CONTRAST:  172m OMNIPAQUE IOHEXOL 300 MG/ML  SOLN COMPARISON:  08/24/2019, 08/23/2019 FINDINGS: Lower chest: Right chest tube is unchanged. Continued dependent lower lobe atelectasis with small bilateral pleural effusions, left greater than right. Scattered areas of ground-glass airspace disease are seen within the right middle and right lower lobe, and could reflect hypoventilatory changes versus developing infection. Hepatobiliary: Stable position of the percutaneous cholecystostomy tube, with complete decompression of the gallbladder. The liver is unremarkable. Pancreas: Unremarkable. No pancreatic ductal dilatation or surrounding inflammatory changes. Stable coarse calcifications within the uncinate process. Spleen: Normal in size without focal abnormality. Adrenals/Urinary Tract: Stable left adrenal nodule. The right adrenal is stable. Chronic scarring of the lower pole left kidney with compensatory hypertrophy of the right kidney. No urinary tract calculi or obstructive uropathy. Stomach/Bowel: No bowel obstruction or ileus. No wall  thickening or inflammatory change. Enteric catheter is seen, tip extending into the proximal jejunum. Vascular/Lymphatic: There is extensive atherosclerosis throughout the abdominal aorta, stable. No pathologic adenopathy. Other: There is trace residual free fluid within the right upper quadrant. No free intraperitoneal gas. Musculoskeletal: No acute or destructive bony lesions. Reconstructed images demonstrate no additional findings. IMPRESSION: 1. Stable position of the percutaneous cholecystostomy tube, with complete decompression of the gallbladder. 2. Trace residual free fluid within the right upper quadrant, decreased since prior. 3. Trace bilateral pleural effusions, left greater than right. 4. Scattered areas of ground-glass airspace disease within the right middle and right lower lobe, which  could reflect hypoventilatory changes versus developing infection. 5. Aortic Atherosclerosis (ICD10-I70.0). Electronically Signed   By: Randa Ngo M.D.   On: 08/28/2019 22:05     Medications:     Scheduled Medications: . amiodarone  200 mg Per Tube BID  . aspirin  81 mg Per Tube Daily  . chlorhexidine gluconate (MEDLINE KIT)  15 mL Mouth Rinse BID  . Chlorhexidine Gluconate Cloth  6 each Topical Daily  . cholestyramine  4 g Oral BID  . collagenase   Topical Daily  . docusate  200 mg Per Tube Daily  . feeding supplement (PROSource TF)  45 mL Per Tube Daily  . free water  300 mL Per Tube Q4H  . insulin aspart  0-24 Units Subcutaneous Q4H  . insulin aspart  5 Units Subcutaneous Q4H  . insulin detemir  5 Units Subcutaneous BID  . ipratropium  0.5 mg Nebulization TID BM  . levalbuterol  1.25 mg Nebulization TID BM  . mouth rinse  15 mL Mouth Rinse 10 times per day  . midodrine  15 mg Per Tube TID WC  . nutrition supplement (JUVEN)  1 packet Per Tube BID BM  . pantoprazole sodium  40 mg Per Tube Daily  . potassium chloride  40 mEq Per Tube BID  . sodium chloride flush  3 mL Intravenous Q12H  .  sodium chloride flush  5 mL Intracatheter Q8H    Infusions: . sodium chloride 250 mL (08/13/19 1412)  . sodium chloride Stopped (08/13/19 1631)  . bivalirudin (ANGIOMAX) infusion 0.5 mg/mL (Non-ACS indications) 0.055 mg/kg/hr (08/29/19 0801)  . feeding supplement (PIVOT 1.5 CAL) 1,000 mL (08/28/19 2011)  . fluconazole (DIFLUCAN) IV Stopped (08/29/19 0525)  . lactated ringers 20 mL/hr at 08/10/19 1705  . lactated ringers 10 mL/hr at 08/18/19 0054  . milrinone 0.125 mcg/kg/min (08/29/19 0801)  . norepinephrine (LEVOPHED) Adult infusion 11 mcg/min (08/29/19 0801)  . piperacillin-tazobactam (ZOSYN)  IV 12.5 mL/hr at 08/29/19 0801    PRN Medications: sodium chloride, sodium chloride, acetaminophen, dextrose, fentaNYL (SUBLIMAZE) injection, metoprolol tartrate, ondansetron (ZOFRAN) IV, sodium chloride flush   Assessment/Plan   1. Shock: At this point, think mixed cardiogenic/septic.  Echo with EF 20-25%, moderate RV dysfunction.  VA ECMO post-VF arrest, cannulated 6/25.  Stable s/p return to OR for mediastinal re-exploration due to bleeding on 6/25.  Decannulated from [place] ECMO on 08/06/19. Impella 5.5 placed. Underwent emergent chest re-exploration on 6/29 for tamponade and clot removal. Chest closed 7/2.  Impella 5.5 removed 7/9.  This morning, he is on NE 10, milrinone 0.125, off vasopressin.  Suspect component of septic/vasodilatory shock with fever, elevated WBCs.  Co-ox 66%, CVP 7.  - Continue midodrine to 15 mg tid and continue to wean NE.  - Continue milrinone at 0.125 mcg - No Lasix today.  2. 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  Prolonged VF arrest on 6/25. Chest closed 7/2.  - ASA  - Hold Crestor with elevated LFTs.  3. Cardiac arrest: VF arrest with prolonged code. Episodes of VT/NSVT in setting of low K (difficult to effectively replace). K now stable, minimal ectopy.  - Continue PO amio 200 mg bid.   - Eventual evaluation for ICD or  lifevest at d/c - Keep K > 4.0 Mg > 2.0 4. Acute hypoxemic respiratory failure: In setting of cardiac arrest.  H/o COPD.  Bibasilar infiltrates, possible HCAP.  Now with tracheostomy. On Zosyn + diflucan with candida in  BAL and blood culture.  Currently CPAP via trach. - Trach collar trials per CCM.  5. Acute blood loss anemia: Now s/p mediastinal re-exploration x 3. Hgb 8 today.  CT drainage has slowed.   - Now on bivalirudin gtt. - 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  Platelets have recovered.  7. ID: Suspect HCAP initially, on meropenem/anidulafungin/vanco initially with ongoing fevers and leukocytosis. Venous dopplers negative for DVT. Possible drug fever from meropenem, he was switched to Zosyn on 7/14 and is now afebrile.  He had abdominal US with gallbladder sludge, no definite cholecystitis.  HIDA scan done, GB does not opacify => ?acalculous cholecystitis => now with cholecystostomy tube.  Most recently, Candida in blood culture and BAL, he is on Diflucan. Exam by ophthalmology => no candidal endophthalmitis.  Still febrile with rising WBCs.  - ID following.  - Repeat blood cultures.  - Continues on Zosyn + Diflucan.  - Remove PICC line, place CVL.  8. Elevated bilirubin: Mostly direct = likely cholestatic/shock liver/RV failure predominantly.   Abdominal US with evidence for cirrhosis, sludge in GB but no definite acute cholecystitis. NH3 was not significantly elevated. Tbili 9 today, stable. HIDA scan showed patent common bile duct, so not obstructive stone causing elevated bilirubin.  9. Ileus: Improved.  Getting TFs.  10. Atrial fibrillation/flutter: Paroxysmal. In NSR today.     - Continue PO amio, 200 mg bid  - bivalirudin gtt.  11. Neuro: Now following commands per nursing. CT head negative on 7/11. EEG with e/o diffuse encephalopathy.  12. Hypernatremia: Na higher today.  - Increase free water boluses to 300 cc q4 hrs.    CRITICAL  CARE Performed by: Loralie Champagne  Total critical care time: 35 minutes  Critical care time was exclusive of separately billable procedures and treating other patients.  Critical care was necessary to treat or prevent imminent or life-threatening deterioration.  Critical care was time spent personally by me on the following activities: development of treatment plan with patient and/or surrogate as well as nursing, discussions with consultants, evaluation of patient's response to treatment, examination of patient, obtaining history from patient or surrogate, ordering and performing treatments and interventions, ordering and review of laboratory studies, ordering and review of radiographic studies, pulse oximetry and re-evaluation of patient's condition.  Loralie Champagne 08/29/2019 9:02 AM

## 2019-08-29 NOTE — Progress Notes (Signed)
Subjective:  Patient confused  Antibiotics:  Anti-infectives (From admission, onward)   Start     Dose/Rate Route Frequency Ordered Stop   08/25/19 0200  fluconazole (DIFLUCAN) IVPB 400 mg     Discontinue     400 mg 100 mL/hr over 120 Minutes Intravenous Every 24 hours 08/25/19 0146     08/22/19 1400  piperacillin-tazobactam (ZOSYN) IVPB 3.375 g  Status:  Discontinued        3.375 g 12.5 mL/hr over 240 Minutes Intravenous Every 8 hours 08/22/19 1032 08/29/19 1021   08/20/19 2200  vancomycin (VANCOREADY) IVPB 750 mg/150 mL  Status:  Discontinued        750 mg 150 mL/hr over 60 Minutes Intravenous Every 24 hours 08/19/19 1623 08/20/19 0758   08/20/19 0900  vancomycin (VANCOREADY) IVPB 750 mg/150 mL  Status:  Discontinued        750 mg 150 mL/hr over 60 Minutes Intravenous Every 24 hours 08/20/19 0758 08/21/19 0852   08/16/19 1400  vancomycin (VANCOCIN) IVPB 1000 mg/200 mL premix  Status:  Discontinued        1,000 mg 200 mL/hr over 60 Minutes Intravenous Every 24 hours 08/16/19 1108 08/19/19 1623   08/16/19 0745  anidulafungin ([name]) 100 mg in sodium chloride 0.9 % 100 mL IVPB  Status:  Discontinued       "Followed by" Linked Group Details   100 mg 78 mL/hr over 100 Minutes Intravenous Every 24 hours 08/15/19 0741 08/22/19 1032   08/15/19 0730  anidulafungin ([name]) 200 mg in sodium chloride 0.9 % 200 mL IVPB       "Followed by" Linked Group Details   200 mg 78 mL/hr over 200 Minutes Intravenous  Once 08/15/19 0741 08/15/19 1346   08/13/19 1400  meropenem (MERREM) 1 g in sodium chloride 0.9 % 100 mL IVPB  Status:  Discontinued        1 g 200 mL/hr over 30 Minutes Intravenous Every 8 hours 08/13/19 1236 08/22/19 1032   08/13/19 1400  vancomycin (VANCOREADY) IVPB 1500 mg/300 mL  Status:  Discontinued        1,500 mg 150 mL/hr over 120 Minutes Intravenous Every 24 hours 08/13/19 1244 08/16/19 1108   08/10/19 1504  vancomycin (VANCOCIN) 1,000 mg in sodium chloride 0.9 %  1,000 mL irrigation  Status:  Discontinued          As needed 08/10/19 1505 08/10/19 1840   08/10/19 1500  vancomycin (VANCOCIN) 1,000 mg in sodium chloride 0.9 % 1,000 mL irrigation  Status:  Discontinued         Irrigation To Surgery 08/10/19 1448 08/10/19 1453   08/10/19 1500  vancomycin (VANCOCIN) 1,000 mg in sodium chloride 0.9 % 1,000 mL irrigation  Status:  Discontinued         Irrigation To Surgery 08/10/19 1454 08/10/19 1855   08/10/19 0800  vancomycin (VANCOREADY) IVPB 1750 mg/350 mL        1,750 mg 175 mL/hr over 120 Minutes Intravenous Every 36 hours 08/09/19 1426 08/11/19 2356   08/08/19 2232  vancomycin variable dose per unstable renal function (pharmacist dosing)  Status:  Discontinued         Does not apply See admin instructions 08/08/19 2232 08/09/19 1426   08/06/19 1827  vancomycin (VANCOCIN) powder  Status:  Discontinued          As needed 08/06/19 1828 08/06/19 1936   08/06/19 1503  vancomycin (VANCOCIN) 1,000 mg in sodium  chloride 0.9 % 1,000 mL irrigation  Status:  Discontinued          As needed 08/06/19 1504 08/06/19 1936   08/04/19 2100  vancomycin (VANCOCIN) IVPB 1000 mg/200 mL premix  Status:  Discontinued        1,000 mg 200 mL/hr over 60 Minutes Intravenous Every 12 hours 08/04/19 0721 08/08/19 2225   08/04/19 0730  vancomycin (VANCOREADY) IVPB 2000 mg/400 mL        2,000 mg 200 mL/hr over 120 Minutes Intravenous  Once 08/04/19 0721 08/04/19 1003   08/04/19 0730  meropenem (MERREM) 1 g in sodium chloride 0.9 % 100 mL IVPB        1 g 200 mL/hr over 30 Minutes Intravenous Every 8 hours 08/04/19 0721 08/12/19 2220   08/02/19 2030  vancomycin (VANCOCIN) IVPB 1000 mg/200 mL premix        1,000 mg 200 mL/hr over 60 Minutes Intravenous  Once 08/02/19 1516 08/02/19 2151   08/02/19 1530  cefUROXime (ZINACEF) 1.5 g in sodium chloride 0.9 % 100 mL IVPB        1.5 g 200 mL/hr over 30 Minutes Intravenous Every 12 hours 08/02/19 1516 08/04/19 0430   08/02/19 0400   vancomycin (VANCOREADY) IVPB 1250 mg/250 mL        1,250 mg 166.7 mL/hr over 90 Minutes Intravenous To Surgery 08/01/19 1422 08/02/19 0923   08/02/19 0400  cefUROXime (ZINACEF) 1.5 g in sodium chloride 0.9 % 100 mL IVPB        1.5 g 200 mL/hr over 30 Minutes Intravenous To Surgery 08/01/19 1422 08/02/19 0853   08/02/19 0400  cefUROXime (ZINACEF) 750 mg in sodium chloride 0.9 % 100 mL IVPB        750 mg 200 mL/hr over 30 Minutes Intravenous To Surgery 08/01/19 1422 08/02/19 1430   08/01/19 0400  vancomycin (VANCOREADY) IVPB 1250 mg/250 mL  Status:  Discontinued        1,250 mg 166.7 mL/hr over 90 Minutes Intravenous To Surgery 07/31/19 1034 08/01/19 1421   08/01/19 0400  cefUROXime (ZINACEF) 1.5 g in sodium chloride 0.9 % 100 mL IVPB  Status:  Discontinued        1.5 g 200 mL/hr over 30 Minutes Intravenous To Surgery 07/31/19 1034 08/01/19 1421   08/01/19 0400  cefUROXime (ZINACEF) 750 mg in sodium chloride 0.9 % 100 mL IVPB  Status:  Discontinued        750 mg 200 mL/hr over 30 Minutes Intravenous To Surgery 07/31/19 1034 08/01/19 1421      Medications: Scheduled Meds: . amiodarone  200 mg Per Tube BID  . aspirin  81 mg Per Tube Daily  . chlorhexidine gluconate (MEDLINE KIT)  15 mL Mouth Rinse BID  . Chlorhexidine Gluconate Cloth  6 each Topical Daily  . cholestyramine  4 g Oral BID  . collagenase   Topical Daily  . docusate  200 mg Per Tube Daily  . feeding supplement (PROSource TF)  45 mL Per Tube Daily  . free water  300 mL Per Tube Q4H  . insulin aspart  0-24 Units Subcutaneous Q4H  . insulin aspart  5 Units Subcutaneous Q4H  . insulin detemir  5 Units Subcutaneous BID  . ipratropium  0.5 mg Nebulization TID BM  . levalbuterol  1.25 mg Nebulization TID BM  . mouth rinse  15 mL Mouth Rinse 10 times per day  . midodrine  15 mg Per Tube TID WC  . nutrition supplement (  JUVEN)  1 packet Per Tube BID BM  . pantoprazole sodium  40 mg Per Tube Daily  . potassium chloride  40 mEq  Per Tube BID  . sodium chloride flush  3 mL Intravenous Q12H  . sodium chloride flush  5 mL Intracatheter Q8H   Continuous Infusions: . sodium chloride 250 mL (08/13/19 1412)  . sodium chloride Stopped (08/13/19 1631)  . bivalirudin (ANGIOMAX) infusion 0.5 mg/mL (Non-ACS indications) 0.055 mg/kg/hr (08/29/19 1200)  . feeding supplement (PIVOT 1.5 CAL) 1,000 mL (08/28/19 2011)  . fluconazole (DIFLUCAN) IV Stopped (08/29/19 0525)  . lactated ringers 20 mL/hr at 08/10/19 1705  . lactated ringers 10 mL/hr at 08/18/19 0054  . milrinone 0.125 mcg/kg/min (08/29/19 1326)  . norepinephrine (LEVOPHED) Adult infusion 6 mcg/min (08/29/19 1200)   PRN Meds:.sodium chloride, sodium chloride, acetaminophen, dextrose, fentaNYL (SUBLIMAZE) injection, metoprolol tartrate, ondansetron (ZOFRAN) IV, sodium chloride flush    Objective: Weight change: -4.4 kg  Intake/Output Summary (Last 24 hours) at 08/29/2019 1417 Last data filed at 08/29/2019 1326 Gross per 24 hour  Intake 9280.94 ml  Output 3420 ml  Net 5860.94 ml   Blood pressure (!) 68/45, pulse 88, temperature (!) 100.8 F (38.2 C), resp. rate (!) 30, height _0  (1.753 m), weight 66 kg, SpO2 100 %. Temp:  [98.5 F (36.9 C)-101.5 F (38.6 C)] 100.8 F (38.2 C) (07/21 1230) Pulse Rate:  [85-109] 88 (07/21 1307) Resp:  [17-35] 30 (07/21 1307) BP: (55-125)/(28-92) 68/45 (07/21 1200) SpO2:  [96 %-100 %] 100 % (07/21 1230) Arterial Line BP: (90-126)/(41-60) 106/50 (07/21 1230) FiO2 (%):  [10 %-40 %] 40 % (07/21 1307) Weight:  [66 kg] 66 kg (07/21 0438)  Physical Exam: General:confused HEENT: icteric sclera, EOMI CVS regular rate,  Chest: , Rhonchi Abdomen: Biliary drain with bilious material Extremities: Jaundiced  Neuro: nonfocal  CBC:    BMET Recent Labs    08/28/19 1700 08/29/19 0222  NA 148* 150*  K 3.9 4.4  CL 115* 116*  CO2 25 25  GLUCOSE 193* 199*  BUN 43* 48*  CREATININE 1.13 1.16  CALCIUM 7.8* 8.6*     Liver  Panel  Recent Labs    08/27/19 1600 08/27/19 1651 08/28/19 0234 08/28/19 0234 08/28/19 1700 08/29/19 0222  PROT  --   --  6.8  --   --  5.9*  ALBUMIN   < >  --  1.9*   < > 2.0* 2.1*  AST  --   --  96*  --   --  103*  ALT  --   --  107*  --   --  113*  ALKPHOS  --   --  395*  --   --  449*  BILITOT  --   --  8.7*  --   --  9.0*  BILIDIR  --  6.4*  --   --   --   --    < > = values in this interval not displayed.       Sedimentation Rate No results for input(s): ESRSEDRATE in the last 72 hours. C-Reactive Protein No results for input(s): CRP in the last 72 hours.  Micro Results: Recent Results (from the past 720 hour(s))  Surgical pcr screen     Status: None   Collection Time: 08/01/19 10:07 PM   Specimen: Nasal Mucosa; Nasal Swab  Result Value Ref Range Status   MRSA, PCR NEGATIVE NEGATIVE Final   Staphylococcus aureus NEGATIVE NEGATIVE Final    Comment: (  NOTE) The Xpert SA Assay (FDA approved for NASAL specimens in patients 78 years of age and older), is one component of a comprehensive surveillance program. It is not intended to diagnose infection nor to guide or monitor treatment. Performed at [facility], [place] [address], [place], [place] [address]   Culture, respiratory (non-expectorated)     Status: None   Collection Time: 08/11/19  3:32 PM   Specimen: Tracheal Aspirate; Respiratory  Result Value Ref Range Status   Specimen Description TRACHEAL ASPIRATE  Final   Special Requests Normal  Final   Gram Stain   Final    NO WBC SEEN NO SQUAMOUS EPITHELIAL CELLS SEEN RARE BUDDING YEAST SEEN Performed at [facility], [address] [place], [place] [address]    Culture FEW CANDIDA TROPICALIS  Final   Report Status 08/13/2019 FINAL  Final  Culture, blood (Routine X 2) w Reflex to ID Panel     Status: None   Collection Time: 08/13/19  8:27 AM   Specimen: BLOOD  Result Value Ref Range Status   Specimen Description BLOOD BLOOD LEFT ARM  Final    Special Requests   Final    BOTTLES DRAWN AEROBIC AND ANAEROBIC Blood Culture adequate volume   Culture   Final    NO GROWTH 5 DAYS Performed at [facility], [place] [address] [place], [place] [address]    Report Status 08/18/2019 FINAL  Final  Culture, blood (Routine X 2) w Reflex to ID Panel     Status: None   Collection Time: 08/13/19  8:31 AM   Specimen: BLOOD  Result Value Ref Range Status   Specimen Description BLOOD BLOOD LEFT HAND  Final   Special Requests   Final    BOTTLES DRAWN AEROBIC AND ANAEROBIC Blood Culture adequate volume   Culture   Final    NO GROWTH 5 DAYS Performed at [facility], [place] [address] [place], [place] [address]    Report Status 08/18/2019 FINAL  Final  Culture, respiratory (non-expectorated)     Status: None   Collection Time: 08/13/19  4:12 PM   Specimen: Tracheal Aspirate; Respiratory  Result Value Ref Range Status   Specimen Description TRACHEAL ASPIRATE  Final   Special Requests NONE  Final   Gram Stain   Final    NO WBC SEEN FEW YEAST Performed at [facility], [place] [address], [place], [place] [address]    Culture FEW CANDIDA TROPICALIS  Final   Report Status 08/16/2019 FINAL  Final  Culture, Urine     Status: None   Collection Time: 08/15/19  6:13 PM   Specimen: Urine, Catheterized  Result Value Ref Range Status   Specimen Description URINE, CATHETERIZED  Final   Special Requests NONE  Final   Culture   Final    NO GROWTH Performed at [facility], [address], [place],  [address]    Report Status 08/17/2019 FINAL  Final  Culture, blood (routine x 2)     Status: None   Collection Time: 08/15/19  7:53 PM   Specimen: BLOOD RIGHT HAND  Result Value Ref Range Status   Specimen Description BLOOD RIGHT HAND  Final   Special Requests   Final    BOTTLES DRAWN AEROBIC AND ANAEROBIC Blood Culture adequate volume   Culture   Final    NO GROWTH 5 DAYS Performed at [facility], [place],  [place] [address]    Report Status 08/20/2019 FINAL  Final  Culture, blood (routine x 2)     Status: None   Collection Time: 08/15/19  7:53 PM   Specimen: BLOOD RIGHT HAND  Result Value Ref Range Status   Specimen Description BLOOD RIGHT HAND  Final   Special Requests   Final    BOTTLES DRAWN AEROBIC AND ANAEROBIC Blood Culture adequate volume   Culture   Final    NO GROWTH 5 DAYS Performed at [facility], [place] [address], [place], [place] [address]    Report Status 08/20/2019 FINAL  Final  Aerobic/Anaerobic Culture (surgical/deep wound)     Status: None   Collection Time: 08/17/19  2:32 PM   Specimen: Wound  Result Value Ref Range Status   Specimen Description WOUND  Final   Special Requests NONE  Final   Gram Stain NO WBC SEEN NO ORGANISMS SEEN   Final   Culture   Final    No growth aerobically or anaerobically. Performed at [facility], [place] [address], [place], [place] [address]    Report Status 08/22/2019 FINAL  Final  Culture, fungus without smear     Status: Abnormal (Preliminary result)   Collection Time: 08/20/19  2:00 PM   Specimen: Bronchoalveolar Lavage; Other  Result Value Ref Range Status   Specimen Description BRONCHIAL ALVEOLAR LAVAGE  Final   Special Requests   Final    Normal Performed at [facility], [place] [address], [place], [place] [address]    Culture CANDIDA ALBICANS (A)  Final   Report Status PENDING  Incomplete  Culture, bal-quantitative     Status: Abnormal   Collection Time: 08/20/19  2:00 PM   Specimen: Bronchoalveolar Lavage; Respiratory  Result Value Ref Range Status   Specimen Description BRONCHIAL ALVEOLAR LAVAGE  Final   Special Requests NONE  Final   Gram Stain   Final    FEW WBC PRESENT, PREDOMINANTLY PMN NO ORGANISMS SEEN Performed at [facility], [address], [place], [place] [address]    Culture (A)  Final    4,000 COLONIES/mL CANDIDA TROPICALIS 3,000 COLONIES/mL CANDIDA ALBICANS    Report  Status 08/25/2019 FINAL  Final  Body fluid culture     Status: None   Collection Time: 08/22/19  9:14 AM   Specimen: Pleura; Body Fluid  Result Value Ref Range Status   Specimen Description PLEURAL  Final   Special Requests NONE  Final   Gram Stain   Final    NO WBC SEEN NO ORGANISMS SEEN Performed at [facility], [address], [place], [place] [address]    Culture RARE CANDIDA PARAPSILOSIS  Final   Report Status 08/25/2019 FINAL  Final  Culture, blood (routine x 2)     Status: Abnormal   Collection Time: 08/22/19 12:58 PM   Specimen: BLOOD RIGHT HAND  Result Value Ref Range Status   Specimen Description BLOOD RIGHT HAND  Final   Special Requests   Final    BOTTLES DRAWN AEROBIC ONLY Blood Culture adequate volume   Culture  Setup Time   Final    AEROBIC BOTTLE ONLY YEAST CRITICAL RESULT CALLED TO, READ BACK BY AND VERIFIED WITH: Karsten Ro [facility] 08/25/19 0123 JDW Performed at [facility], [place] [address], [place], [place] [address]    Culture CANDIDA PARAPSILOSIS (A)  Final   Report Status 08/26/2019 FINAL  Final  Culture, blood (routine x 2)  Status: None   Collection Time: 08/22/19 12:58 PM   Specimen: BLOOD RIGHT HAND  Result Value Ref Range Status   Specimen Description BLOOD RIGHT HAND  Final   Special Requests   Final    BOTTLES DRAWN AEROBIC ONLY Blood Culture adequate volume   Culture   Final    NO GROWTH 5 DAYS Performed at [facility], [address] [place], [place] [address]    Report Status 08/27/2019 FINAL  Final  Blood Culture ID Panel (Reflexed)     Status: Abnormal   Collection Time: 08/22/19 12:58 PM  Result Value Ref Range Status   Enterococcus species NOT DETECTED NOT DETECTED Final   Listeria monocytogenes NOT DETECTED NOT DETECTED Final   Staphylococcus species NOT DETECTED NOT DETECTED Final   Staphylococcus aureus (BCID) NOT DETECTED NOT DETECTED Final   Streptococcus species NOT DETECTED NOT DETECTED Final    Streptococcus agalactiae NOT DETECTED NOT DETECTED Final   Streptococcus pneumoniae NOT DETECTED NOT DETECTED Final   Streptococcus pyogenes NOT DETECTED NOT DETECTED Final   Acinetobacter baumannii NOT DETECTED NOT DETECTED Final   Enterobacteriaceae species NOT DETECTED NOT DETECTED Final   Enterobacter cloacae complex NOT DETECTED NOT DETECTED Final   Escherichia coli NOT DETECTED NOT DETECTED Final   Klebsiella oxytoca NOT DETECTED NOT DETECTED Final   Klebsiella pneumoniae NOT DETECTED NOT DETECTED Final   Proteus species NOT DETECTED NOT DETECTED Final   Serratia marcescens NOT DETECTED NOT DETECTED Final   Haemophilus influenzae NOT DETECTED NOT DETECTED Final   Neisseria meningitidis NOT DETECTED NOT DETECTED Final   Pseudomonas aeruginosa NOT DETECTED NOT DETECTED Final   Candida albicans NOT DETECTED NOT DETECTED Final   Candida glabrata NOT DETECTED NOT DETECTED Final   Candida krusei NOT DETECTED NOT DETECTED Final   Candida parapsilosis DETECTED (A) NOT DETECTED Final    Comment: CRITICAL RESULT CALLED TO, READ BACK BY AND VERIFIED WITH: J LEDFORD PHARMD 08/25/19 0123 JDW    Candida tropicalis NOT DETECTED NOT DETECTED Final    Comment: Performed at [facility], [address], [place], [place] [address]  Aerobic/Anaerobic Culture (surgical/deep wound)     Status: None   Collection Time: 08/24/19  1:35 PM   Specimen: Abscess  Result Value Ref Range Status   Specimen Description ABSCESS  Final   Special Requests DRAIN  Final   Gram Stain   Final    RARE WBC PRESENT, PREDOMINANTLY PMN NO ORGANISMS SEEN    Culture   Final    No growth aerobically or anaerobically. Performed at [facility], [place] [address] [place], [place] [address]    Report Status 08/29/2019 FINAL  Final  Culture, blood (routine x 2)     Status: None (Preliminary result)   Collection Time: 08/25/19  3:53 PM   Specimen: BLOOD RIGHT HAND  Result Value Ref Range Status   Specimen  Description BLOOD RIGHT HAND  Final   Special Requests   Final    BOTTLES DRAWN AEROBIC AND ANAEROBIC Blood Culture adequate volume   Culture   Final    NO GROWTH 4 DAYS Performed at [facility], [place] [address] [place], [place] [address]    Report Status PENDING  Incomplete  Culture, blood (routine x 2)     Status: None (Preliminary result)   Collection Time: 08/25/19  3:53 PM   Specimen: BLOOD RIGHT ARM  Result Value Ref Range Status   Specimen Description BLOOD RIGHT  ARM  Final   Special Requests   Final    BOTTLES DRAWN AEROBIC ONLY Blood Culture adequate volume   Culture   Final    NO GROWTH 4 DAYS Performed at [facility], [address], [place], [place] [address]    Report Status PENDING  Incomplete    Studies/Results: CT ABDOMEN W CONTRAST  Result Date: 08/28/2019 CLINICAL DATA:  Fungemia, respiratory failure, acute cholecystitis with percutaneous cholecystostomy EXAM: CT ABDOMEN WITH CONTRAST TECHNIQUE: Multidetector CT imaging of the abdomen was performed using the standard protocol following bolus administration of intravenous contrast. CONTRAST:  141m OMNIPAQUE IOHEXOL 300 MG/ML  SOLN COMPARISON:  08/24/2019, 08/23/2019 FINDINGS: Lower chest: Right chest tube is unchanged. Continued dependent lower lobe atelectasis with small bilateral pleural effusions, left greater than right. Scattered areas of ground-glass airspace disease are seen within the right middle and right lower lobe, and could reflect hypoventilatory changes versus developing infection. Hepatobiliary: Stable position of the percutaneous cholecystostomy tube, with complete decompression of the gallbladder. The liver is unremarkable. Pancreas: Unremarkable. No pancreatic ductal dilatation or surrounding inflammatory changes. Stable coarse calcifications within the uncinate process. Spleen: Normal in size without focal abnormality. Adrenals/Urinary Tract: Stable left adrenal nodule. The right adrenal is  stable. Chronic scarring of the lower pole left kidney with compensatory hypertrophy of the right kidney. No urinary tract calculi or obstructive uropathy. Stomach/Bowel: No bowel obstruction or ileus. No wall thickening or inflammatory change. Enteric catheter is seen, tip extending into the proximal jejunum. Vascular/Lymphatic: There is extensive atherosclerosis throughout the abdominal aorta, stable. No pathologic adenopathy. Other: There is trace residual free fluid within the right upper quadrant. No free intraperitoneal gas. Musculoskeletal: No acute or destructive bony lesions. Reconstructed images demonstrate no additional findings. IMPRESSION: 1. Stable position of the percutaneous cholecystostomy tube, with complete decompression of the gallbladder. 2. Trace residual free fluid within the right upper quadrant, decreased since prior. 3. Trace bilateral pleural effusions, left greater than right. 4. Scattered areas of ground-glass airspace disease within the right middle and right lower lobe, which could reflect hypoventilatory changes versus developing infection. 5. Aortic Atherosclerosis (ICD10-I70.0). Electronically Signed   By: MRanda NgoM.D.   On: 08/28/2019 22:05      Assessment/Plan:  INTERVAL HISTORY: Febrile again, status post unrevealing CT   Principal Problem:   Candidemia (HCC) Active Problems:   CHF (congestive heart failure) (HCC)   Chronic pain syndrome   Mixed hyperlipidemia   Moderate recurrent major depression (HCC)   Hypothyroidism   Acute systolic heart failure (HCC)   Type 2 diabetes mellitus with hyperglycemia (HCC)   AKI (acute kidney injury) ([place]   Coronary artery disease involving native coronary artery of native heart with unstable angina pectoris ([place]   Coronary artery disease   Malnutrition of moderate degree   Pressure injury of skin   Acute on chronic respiratory failure (HCC)   Acute respiratory failure (HCC)   S/P CABG (coronary artery bypass  graft)   Acalculous cholecystitis   ARDS (adult respiratory distress syndrome) ([facility]   Cardiogenic shock ([place]    Todd PickrellFFontenetteis a 64y.o. male with  complicated hospitalization following CABG that [place] support following several arrests and open chest procedures at the bedside. He has been on long term carbapenem up until recently when we switched him to zosyn and had gallbladder drain placed for rising bilirubinemia/cholangitis --> no obstructing stones on HIDA, ?Acalculous cholecystitis.   No growth from aspirated bile from perc-chole  drain  He was found to have candida parapsilosis in blood  #1 candidemia:  Continue fluconazole  When able to discontinue central lines he should have a "line holiday with peripheral IV access and repeat blood cultures.  He should then have 2 weeks of antifungal therapy  He currently remains on inotropes and was on levophed  I appreciate ophthalmology seeing the patient.  Unfortunately their exam was compromised the patient's mental status and inability coordinate with them  #2 possible acalculous cholecystitis I will DC the Zosyn for now to remove selective pressure for multidrug-resistant organisms.  CT abdomen and pelvis does not show any residual infection  #3  Fevers: Agree with getting rid of lines and they could certainly also be a cause of fevers.    LOS: 34 days   Alcide Evener 08/29/2019, 2:17 PM

## 2019-08-30 ENCOUNTER — Inpatient Hospital Stay (HOSPITAL_COMMUNITY): Payer: Medicare Other

## 2019-08-30 LAB — CBC WITH DIFFERENTIAL/PLATELET
Abs Immature Granulocytes: 0 10*3/uL (ref 0.00–0.07)
Basophils Absolute: 0 10*3/uL (ref 0.0–0.1)
Basophils Relative: 0 %
Eosinophils Absolute: 0 10*3/uL (ref 0.0–0.5)
Eosinophils Relative: 0 %
HCT: 25.3 % — ABNORMAL LOW (ref 39.0–52.0)
Hemoglobin: 6.9 g/dL — CL (ref 13.0–17.0)
Lymphocytes Relative: 6 %
Lymphs Abs: 1.6 10*3/uL (ref 0.7–4.0)
MCH: 30.7 pg (ref 26.0–34.0)
MCHC: 27.3 g/dL — ABNORMAL LOW (ref 30.0–36.0)
MCV: 112.4 fL — ABNORMAL HIGH (ref 80.0–100.0)
Monocytes Absolute: 1.6 10*3/uL — ABNORMAL HIGH (ref 0.1–1.0)
Monocytes Relative: 6 %
Neutro Abs: 23.8 10*3/uL — ABNORMAL HIGH (ref 1.7–7.7)
Neutrophils Relative %: 88 %
Platelets: 399 10*3/uL (ref 150–400)
RBC: 2.25 MIL/uL — ABNORMAL LOW (ref 4.22–5.81)
RDW: 24.4 % — ABNORMAL HIGH (ref 11.5–15.5)
WBC: 27 10*3/uL — ABNORMAL HIGH (ref 4.0–10.5)
nRBC: 0 /100 WBC
nRBC: 0.3 % — ABNORMAL HIGH (ref 0.0–0.2)

## 2019-08-30 LAB — RENAL FUNCTION PANEL
Albumin: 1.9 g/dL — ABNORMAL LOW (ref 3.5–5.0)
Anion gap: 9 (ref 5–15)
BUN: 64 mg/dL — ABNORMAL HIGH (ref 8–23)
CO2: 22 mmol/L (ref 22–32)
Calcium: 8.4 mg/dL — ABNORMAL LOW (ref 8.9–10.3)
Chloride: 117 mmol/L — ABNORMAL HIGH (ref 98–111)
Creatinine, Ser: 1.32 mg/dL — ABNORMAL HIGH (ref 0.61–1.24)
GFR calc Af Amer: >60 mL/min (ref >60)
GFR calc non Af Amer: 56 mL/min — ABNORMAL LOW (ref >60)
Glucose, Bld: 176 mg/dL — ABNORMAL HIGH (ref 70–99)
Phosphorus: 4.4 mg/dL (ref 2.5–4.6)
Potassium: 4.5 mmol/L (ref 3.5–5.1)
Sodium: 148 mmol/L — ABNORMAL HIGH (ref 135–145)

## 2019-08-30 LAB — CBC
HCT: 25.1 % — ABNORMAL LOW (ref 39.0–52.0)
Hemoglobin: 7 g/dL — ABNORMAL LOW (ref 13.0–17.0)
MCH: 30.4 pg (ref 26.0–34.0)
MCHC: 27.9 g/dL — ABNORMAL LOW (ref 30.0–36.0)
MCV: 109.1 fL — ABNORMAL HIGH (ref 80.0–100.0)
Platelets: 403 10*3/uL — ABNORMAL HIGH (ref 150–400)
RBC: 2.3 MIL/uL — ABNORMAL LOW (ref 4.22–5.81)
RDW: 24.3 % — ABNORMAL HIGH (ref 11.5–15.5)
WBC: 25.7 10*3/uL — ABNORMAL HIGH (ref 4.0–10.5)
nRBC: 0.2 % (ref 0.0–0.2)

## 2019-08-30 LAB — COMPREHENSIVE METABOLIC PANEL
ALT: 106 U/L — ABNORMAL HIGH (ref 0–44)
AST: 91 U/L — ABNORMAL HIGH (ref 15–41)
Albumin: 1.9 g/dL — ABNORMAL LOW (ref 3.5–5.0)
Alkaline Phosphatase: 350 U/L — ABNORMAL HIGH (ref 38–126)
Anion gap: 7 (ref 5–15)
BUN: 58 mg/dL — ABNORMAL HIGH (ref 8–23)
CO2: 22 mmol/L (ref 22–32)
Calcium: 8.4 mg/dL — ABNORMAL LOW (ref 8.9–10.3)
Chloride: 118 mmol/L — ABNORMAL HIGH (ref 98–111)
Creatinine, Ser: 1.38 mg/dL — ABNORMAL HIGH (ref 0.61–1.24)
GFR calc Af Amer: >60 mL/min (ref >60)
GFR calc non Af Amer: 53 mL/min — ABNORMAL LOW (ref >60)
Glucose, Bld: 295 mg/dL — ABNORMAL HIGH (ref 70–99)
Potassium: 4.8 mmol/L (ref 3.5–5.1)
Sodium: 147 mmol/L — ABNORMAL HIGH (ref 135–145)
Total Bilirubin: 7.3 mg/dL — ABNORMAL HIGH (ref 0.3–1.2)
Total Protein: 5.7 g/dL — ABNORMAL LOW (ref 6.5–8.1)

## 2019-08-30 LAB — GLUCOSE, CAPILLARY
Glucose-Capillary: 145 mg/dL — ABNORMAL HIGH (ref 70–99)
Glucose-Capillary: 178 mg/dL — ABNORMAL HIGH (ref 70–99)
Glucose-Capillary: 194 mg/dL — ABNORMAL HIGH (ref 70–99)
Glucose-Capillary: 243 mg/dL — ABNORMAL HIGH (ref 70–99)
Glucose-Capillary: 256 mg/dL — ABNORMAL HIGH (ref 70–99)
Glucose-Capillary: 264 mg/dL — ABNORMAL HIGH (ref 70–99)

## 2019-08-30 LAB — COOXEMETRY PANEL
Carboxyhemoglobin: 2.8 % — ABNORMAL HIGH (ref 0.5–1.5)
Methemoglobin: 1.4 % (ref 0.0–1.5)
O2 Saturation: 99.3 %
Total hemoglobin: 7.3 g/dL — ABNORMAL LOW (ref 12.0–16.0)

## 2019-08-30 LAB — HEMOGLOBIN AND HEMATOCRIT, BLOOD
HCT: 29.6 % — ABNORMAL LOW (ref 39.0–52.0)
Hemoglobin: 8.5 g/dL — ABNORMAL LOW (ref 13.0–17.0)

## 2019-08-30 LAB — CULTURE, BLOOD (ROUTINE X 2)
Culture: NO GROWTH
Culture: NO GROWTH
Special Requests: ADEQUATE
Special Requests: ADEQUATE

## 2019-08-30 LAB — APTT: aPTT: 73 seconds — ABNORMAL HIGH (ref 24–36)

## 2019-08-30 LAB — PREPARE RBC (CROSSMATCH)

## 2019-08-30 MED ORDER — FUROSEMIDE 10 MG/ML IJ SOLN
40.0000 mg | Freq: Once | INTRAMUSCULAR | Status: AC
  Administered 2019-08-30: 40 mg via INTRAVENOUS
  Filled 2019-08-30: qty 4

## 2019-08-30 MED ORDER — SODIUM CHLORIDE 0.9 % IV SOLN
1.0000 g | Freq: Three times a day (TID) | INTRAVENOUS | Status: DC
  Administered 2019-08-30 – 2019-09-03 (×13): 1 g via INTRAVENOUS
  Filled 2019-08-30 (×15): qty 1

## 2019-08-30 MED ORDER — SODIUM CHLORIDE 0.9% IV SOLUTION: Freq: Once | INTRAVENOUS | Status: AC

## 2019-08-30 MED ORDER — POTASSIUM CHLORIDE 20 MEQ PO PACK
40.0000 meq | PACK | Freq: Once | ORAL | Status: AC
  Administered 2019-08-30: 40 meq
  Filled 2019-08-30: qty 2

## 2019-08-30 MED ORDER — LIP MEDEX EX OINT
TOPICAL_OINTMENT | CUTANEOUS | Status: DC | PRN
  Filled 2019-08-30 (×2): qty 7

## 2019-08-30 MED ORDER — INSULIN DETEMIR 100 UNIT/ML ~~LOC~~ SOLN
8.0000 [IU] | Freq: Two times a day (BID) | SUBCUTANEOUS | Status: DC
  Administered 2019-08-30 – 2019-09-01 (×5): 8 [IU] via SUBCUTANEOUS
  Filled 2019-08-30 (×6): qty 0.08

## 2019-08-30 MED ORDER — VANCOMYCIN HCL 1500 MG/300ML IV SOLN
1500.0000 mg | Freq: Once | INTRAVENOUS | Status: AC
  Administered 2019-08-30: 1500 mg via INTRAVENOUS
  Filled 2019-08-30: qty 300

## 2019-08-30 MED ORDER — VANCOMYCIN HCL 1250 MG/250ML IV SOLN
1250.0000 mg | INTRAVENOUS | Status: DC
  Administered 2019-08-31 – 2019-09-02 (×3): 1250 mg via INTRAVENOUS
  Filled 2019-08-30 (×4): qty 250

## 2019-08-30 NOTE — Progress Notes (Signed)
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    Assessment: 64 yo male s/p ECMO and Impella. Chest closed 7/2. CT drainage decreasing.    HIT ab sent and returned as 2.6 - positive, switched from heparin to bivalirudin. SRA positive, confirmed for HIT.  bivalirudin started 08/16/19 APTT (73sec) remains therapeutic on bivalirudin 0.055 mg/kg/hr (wt 71.7kg). Hgb stable 7-8, platelets stable 300s. No bleeding noted per RN.   Goals of Therapy: APTT 50-85 secs  Plan: Continue bivalirudin at 0.055 mg/kg/hr Monitor daily aPTT, CBC, and for s/sx of bleeding  Leota Sauers Pharm.D. CPP, BCPS Clinical Pharmacist [phone] 08/30/2019 2:02 PM    Please check [email or web address] for unit-specific pharmacist phone numbers

## 2019-08-30 NOTE — Progress Notes (Signed)
Patient ID: Todd [name], male   DOB: Jan 14, 1956, 64 y.o.   MRN: [id number]     Advanced Heart Failure Rounding Note  PCP-Cardiologist: No primary care provider on file.   Subjective:    08/02/19 CABG 08/03/19 VF arrest. Chest open at bedside 08/03/19 Back to OR for ECMO cannulation 08/03/19 Washout out for tamponade 08/04/19 Repeat bedside washout for tamponade 08/06/19 To OR for decannulation and Impella 5.5 08/07/19 Underwent emergent bedside chest exploration at bedside for tamponade  08/10/19 Chest closed 08/16/19 HIT positive, bivalirudin begun 08/17/19 Impella removed 08/20/19 Tracheostomy 08/23/19 HIDA scan showed patent common bile duct (unable to visualize GB, unable to rule out acalculous cholecystitis).  08/24/19 cholecystostomy tube  Remains on Milrinone 0.125, NE 11. Off VP. Midodrine 15 mg tid.  Co-ox inaccurate today. MAPs stable.  CVP not hooked up but weight up 3 lbs. Creatinine 1.38.   NSR on po amiodarone.    Meropenem and [name] stopped 7/14, Zosyn stopped 7/21.  Now with Candida from BAL and blood culture, started on Diflucan.  WBCs 19>>24>>29>>24>>30>>26. Febrile to 101.8.  Abdominal CT 7/20 with no definite source.   Hgb down to 7.   Now getting tube feeds via post-pyloric tube.   Jaundiced with consistently elevated bilirubin (direct), tbili lower at 7.3 today.  Abdominal US with gallbladder sludge, no definite cholecystitis.  HIDA scan with patent CBD but no gallbladder activity. Abdominal CT with gallbladder sludge. Now s/p cholecystostomy tube.   Objective:   Weight Range: 67.2 kg Body mass index is 21.88 kg/m.   Vital Signs:   Temp:  [100.8 F (38.2 C)-102 F (38.9 C)] 101.1 F (38.4 C) (07/22 0800) Pulse Rate:  [85-96] 88 (07/22 0800) Resp:  [21-32] 27 (07/22 0800) BP: (55-139)/(28-86) 130/54 (07/22 0800) SpO2:  [98 %-100 %] 100 % (07/22 0800) Arterial Line BP: (84-131)/(37-65) 114/57 (07/22 0800) FiO2 (%):  [40 %] 40 % (07/22 0753) Weight:  [67.2 kg]  67.2 kg (07/22 0455) Last BM Date: 08/29/19  Weight change: Filed Weights   08/28/19 0210 08/29/19 0438 08/30/19 0455  Weight: 70.4 kg 66 kg 67.2 kg    Intake/Output:   Intake/Output Summary (Last 24 hours) at 08/30/2019 0834 Last data filed at 08/30/2019 0700 Gross per 24 hour  Intake 3210.76 ml  Output 1320 ml  Net 1890.76 ml      Physical Exam    General: Awake on vent via trach Neck: Tracheostomy, no thyromegaly or thyroid nodule.  Lungs: Rhonchorous CV: Nondisplaced PMI.  Heart regular S1/S2, no S3/S4, no murmur.  No peripheral edema.   Abdomen: Soft, nontender, no hepatosplenomegaly, no distention.  Skin: Jaundiced.  Neurologic: Intermittently follows commands per nurse Extremities: No clubbing or cyanosis.  HEENT: Normal.    Telemetry   NSR 70s.  Personally reviewed   Labs    CBC Recent Labs    08/29/19 0222 08/29/19 0222 08/29/19 1440 08/30/19 0453  WBC 29.7*  --   --  25.7*  HGB 8.0*   < > 7.8* 7.0*  HCT 27.7*   < > 23.0* 25.1*  MCV 105.3*  --   --  109.1*  PLT 409*  --   --  403*   < > = values in this interval not displayed.   Basic Metabolic Panel Recent Labs    08/28/19 1700 08/28/19 1700 08/29/19 0222 08/29/19 1440 08/29/19 1630 08/30/19 0513  NA 148*   < > 150*   < > 152* 147*  K 3.9   < >  4.4   < > 4.7 4.8  CL 115*   < > 116*  --  120* 118*  CO2 25   < > 25  --  24 22  GLUCOSE 193*   < > 199*  --  218* 295*  BUN 43*   < > 48*  --  55* 58*  CREATININE 1.13   < > 1.16  --  1.16 1.38*  CALCIUM 7.8*   < > 8.6*  --  8.5* 8.4*  MG  --   --  2.3  --   --   --   PHOS 3.8  --   --   --  3.6  --    < > = values in this interval not displayed.   Liver Function Tests Recent Labs    08/29/19 0222 08/29/19 0222 08/29/19 1630 08/30/19 0513  AST 103*  --   --  91*  ALT 113*  --   --  106*  ALKPHOS 449*  --   --  350*  BILITOT 9.0*  --   --  7.3*  PROT 5.9*  --   --  5.7*  ALBUMIN 2.1*   < > 1.9* 1.9*   < > = values in this interval  not displayed.   No results for input(s): LIPASE, AMYLASE in the last 72 hours. Cardiac Enzymes No results for input(s): CKTOTAL, CKMB, CKMBINDEX, TROPONINI in the last 72 hours.  BNP: BNP (last 3 results) Recent Labs    07/26/19 1236  BNP 2,568.2*    ProBNP (last 3 results) No results for input(s): PROBNP in the last 8760 hours.   D-Dimer No results for input(s): DDIMER in the last 72 hours. Hemoglobin A1C No results for input(s): HGBA1C in the last 72 hours. Fasting Lipid Panel No results for input(s): CHOL, HDL, LDLCALC, TRIG, CHOLHDL, LDLDIRECT in the last 72 hours. Thyroid Function Tests No results for input(s): TSH, T4TOTAL, T3FREE, THYROIDAB in the last 72 hours.  Invalid input(s): FREET3  Other results:   Imaging    DG CHEST PORT 1 VIEW  Result Date: 08/30/2019 CLINICAL DATA:  Chest tube. EXAM: PORTABLE CHEST 1 VIEW COMPARISON:  08/27/2019. FINDINGS: Interim removal of left PICC line and placement of left IJ line. Left IJ line tip at cavoatrial junction. Tracheostomy tube and feeding tube in stable position. Bilateral chest tubes in stable position. Prior CABG. Stable cardiomegaly. Diffuse bilateral interstitial prominence consistent with interstitial edema and/or pneumonitis again noted. Small right pleural effusion cannot be excluded. Left costophrenic angle not completely imaged. No pneumothorax. Surgical staples noted over the right chest. IMPRESSION: 1. Interim removal of left PICC line and placement of left IJ line. Left IJ line tip at cavoatrial junction. Tracheostomy tube and feeding tube in stable position. Bilateral chest tubes stable position. No pneumothorax. 2. Prior CABG. Stable cardiomegaly. Diffuse bilateral interstitial prominence consistent interstitial edema and/or pneumonitis again noted. Similar findings noted on prior exam. Small right pleural effusion cannot be excluded. Left costophrenic angle incompletely imaged. Electronically Signed   By: Marcello Moores   Register   On: 08/30/2019 07:06   DG Chest Port 1 View  Result Date: 08/29/2019 CLINICAL DATA:  Central line placement EXAM: PORTABLE CHEST 1 VIEW COMPARISON:  Earlier same day FINDINGS: Left internal jugular central line tip in the SVC 2 cm above the right atrium. Left arm PICC tip at the SVC RA junction or just in the right atrium. No pneumothorax. Left chest tube remains in place. Soft feeding tube enters  the abdomen. Tracheostomy in place. Atelectasis in the lower lungs persists, left worse than right. IMPRESSION: Left internal jugular central line tip in the SVC 2 cm above the right atrium. No pneumothorax. No other change. Electronically Signed   By: Nelson Chimes M.D.   On: 08/29/2019 17:16   DG CHEST PORT 1 VIEW  Result Date: 08/29/2019 CLINICAL DATA:  Hypoxia EXAM: PORTABLE CHEST 1 VIEW COMPARISON:  08/27/2019 FINDINGS: Tracheostomy tube and feeding catheter are again noted and stable. Postsurgical changes are again seen. Left-sided PICC line is noted and stable. Bilateral thoracostomy catheters are seen without evidence of pneumothorax. Stable left retrocardiac opacity is noted. The previously seen interstitial prominence has improved in the interval from the prior exam. IMPRESSION: Persistent left retrocardiac opacity. Tubes and lines as described. Electronically Signed   By: Inez Catalina M.D.   On: 08/29/2019 15:31     Medications:     Scheduled Medications: . amiodarone  200 mg Per Tube BID  . aspirin  81 mg Per Tube Daily  . chlorhexidine gluconate (MEDLINE KIT)  15 mL Mouth Rinse BID  . Chlorhexidine Gluconate Cloth  6 each Topical Daily  . cholestyramine  4 g Oral BID  . collagenase   Topical Daily  . docusate  200 mg Per Tube Daily  . feeding supplement (PROSource TF)  45 mL Per Tube Daily  . free water  300 mL Per Tube Q4H  . insulin aspart  0-24 Units Subcutaneous Q4H  . insulin aspart  5 Units Subcutaneous Q4H  . insulin detemir  8 Units Subcutaneous BID  . ipratropium   0.5 mg Nebulization TID BM  . levalbuterol  1.25 mg Nebulization TID BM  . mouth rinse  15 mL Mouth Rinse 10 times per day  . midodrine  15 mg Per Tube TID WC  . nutrition supplement (JUVEN)  1 packet Per Tube BID BM  . sodium chloride flush  3 mL Intravenous Q12H  . sodium chloride flush  5 mL Intracatheter Q8H    Infusions: . sodium chloride 250 mL (08/13/19 1412)  . sodium chloride Stopped (08/13/19 1631)  . bivalirudin (ANGIOMAX) infusion 0.5 mg/mL (Non-ACS indications) 0.055 mg/kg/hr (08/30/19 0700)  . feeding supplement (PIVOT 1.5 CAL) 1,000 mL (08/29/19 1704)  . fluconazole (DIFLUCAN) IV Stopped (08/30/19 0348)  . lactated ringers 20 mL/hr at 08/10/19 1705  . lactated ringers 10 mL/hr at 08/18/19 0054  . milrinone 0.125 mcg/kg/min (08/30/19 0700)  . norepinephrine (LEVOPHED) Adult infusion 15 mcg/min (08/30/19 0700)    PRN Medications: sodium chloride, sodium chloride, acetaminophen, dextrose, fentaNYL (SUBLIMAZE) injection, metoprolol tartrate, ondansetron (ZOFRAN) IV, sodium chloride flush   Assessment/Plan   1. Shock: Mixed cardiogenic/septic.  Echo with EF 20-25%, moderate RV dysfunction.  VA ECMO post-VF arrest, cannulated 6/25.  Stable s/p return to OR for mediastinal re-exploration due to bleeding on 6/25.  Decannulated from [place] ECMO on 08/06/19. Impella 5.5 placed. Underwent emergent chest re-exploration on 6/29 for tamponade and clot removal. Chest closed 7/2.  Impella 5.5 removed 7/9.  This morning, he is on NE 11, milrinone 0.125, off vasopressin.  Suspect primarily septic/vasodilatory shock with fever, elevated WBCs.  Weight up today, CVP not set up yet.  - Continue midodrine to 15 mg tid and continue to wean NE.  - Continue milrinone at 0.125 mcg - Lasix 40 mg IV x 1 today.  2. 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  Prolonged VF arrest on 6/25.  Chest closed 7/2.  - ASA  - Hold Crestor with elevated LFTs.  3. Cardiac arrest:  VF arrest with prolonged code. Episodes of VT/NSVT in setting of low K (difficult to effectively replace). K now stable, minimal ectopy.  - Continue PO amio 200 mg bid.   - Eventual evaluation for ICD or lifevest at d/c - Keep K > 4.0 Mg > 2.0 4. Acute hypoxemic respiratory failure: In setting of cardiac arrest.  H/o COPD.  Bibasilar infiltrates, possible HCAP.  Now with tracheostomy. Diflucan with candida in BAL and blood culture.  More rhonchorous on exam.  - Discuss bronchoscopy with CCM.   5. Acute blood loss anemia: Now s/p mediastinal re-exploration x 3. Hgb 7 today.  - Now on bivalirudin gtt. - 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  Platelets have recovered.  7. ID: Suspect HCAP initially, on meropenem/anidulafungin/vanco initially with ongoing fevers and leukocytosis. Venous dopplers negative for DVT. Possible drug fever from meropenem, he was switched to Zosyn 7/14 - 7/21.  He had abdominal US with gallbladder sludge, no definite cholecystitis.  HIDA scan done, GB does not opacify => ?acalculous cholecystitis => now with cholecystostomy tube.  Most recently, Candida in blood culture and BAL, he is on Diflucan. Exam by ophthalmology => no candidal endophthalmitis.  Foley and PICC out yesterday.  Still febrile with WBCs 26.   - ID following.  - Currently on Diflucan only, still febrile.  - ?Bronchoscopy/BAL today.   8. Elevated bilirubin: Mostly direct = likely cholestatic/shock liver/RV failure predominantly.   Abdominal US with evidence for cirrhosis, sludge in GB but no definite acute cholecystitis. NH3 was not significantly elevated. Tbili 7.3 today, lower. HIDA scan showed patent common bile duct, so not obstructive stone causing elevated bilirubin.  9. Ileus: Improved.  Getting TFs.  10. Atrial fibrillation/flutter: Paroxysmal. In NSR today.     - Continue PO amio, 200 mg bid  - bivalirudin gtt.  11. Neuro: Now following commands intermittently  per nursing. CT head negative on 7/11. EEG with e/o diffuse encephalopathy.  12. Hypernatremia: Na lower today.  - Continue free water boluses 300 cc q4 hrs.    CRITICAL CARE Performed by: Loralie Champagne  Total critical care time: 35 minutes  Critical care time was exclusive of separately billable procedures and treating other patients.  Critical care was necessary to treat or prevent imminent or life-threatening deterioration.  Critical care was time spent personally by me on the following activities: development of treatment plan with patient and/or surrogate as well as nursing, discussions with consultants, evaluation of patient's response to treatment, examination of patient, obtaining history from patient or surrogate, ordering and performing treatments and interventions, ordering and review of laboratory studies, ordering and review of radiographic studies, pulse oximetry and re-evaluation of patient's condition.  Loralie Champagne 08/30/2019 8:34 AM

## 2019-08-30 NOTE — Progress Notes (Signed)
NAME:  Todd [name], MRN:  [id number], DOB:  [birth year], LOS: 9 ADMISSION DATE:  07/26/2019, CONSULTATION DATE:  08/03/19 REFERRING MD:  Roxan Hockey, CHIEF COMPLAINT:  ECMO   Brief History   64 yo male admitted with CHF exacerbation, found to have multi-vessel disease requiring CABG 6/24, post op VT Arrest 6/25 and cannulated for VA ECMO.    History of present illness   Presented with worsening dyspnea 6/17 c/w CHF exacerbation Cath with severe triple vessel disease Underwent CABG 6/24 Vtach arrest 6/25 Chest opened bedside and cardiac massage initiated as well as multiple cardioversions, amiodarone, bicarb etc Brought to OR and cannulated for VA ECMO PCCM consulted to assist with management Comorbidities include DM, heavy smoking, COPD  Past Medical History  Depression Ischemic cardiomyopathy HTN [facility] Events   6/24 CABG 6/25 VA cannulation 6/28 decannulated and impella placed 6/29 bedside re-exploration; s/p decannulation. Placement of impella device originally at p8 but decreased to p4 2/2 suction events overnight up to p6. Echo completed and repositioned device. Remained on considerable support with 8 epi and 46 norepi vaso 0.05. continued chest tube output with large clots noted. Heparin thru device. Replacement products ongoing. 60% 8 peep 6/30 bedside mediastinal re-exploration and clean out of hematoma with tamponade and worsening hemodynamics. Pt had large volume transfusion. rebolused with amio 2/2 nsvt episode.  Weight up 48 pounds.  Started diuresis, Lasix drip started. 7/01 iatrogenic respiratory alkalosis, vent rate decreased. 7/02 No significant issues overnight remains on pressors and Impella not tolerating tube feeds with high gastric output awaiting core track placement 7/03 started trickle TF  7/05 Swab removed, CVL and PICC placed. 7/13 Trach and BAL 7/16 Awake this AM and tracking. No follow commands. Still having fevers. Successful  ultrasound and CT guided placement of a 10.2 French cholecystostomy tube. A small amount of aspirated bile was capped and sent to the laboratory for analysis 7/17 Off sedation gtt. doing pressure support ventilation.  Beginning to follow some commands occasionally intermittently.  Very jaundiced.  Growing Candida in the blood and BAL.  Still febrile but T-max is coming down.  WBC persistently elevated; currently at 19.4 okay.  Diflucan started. Currently on milrinone GTT, amiodarone gtt., vasopressin gtt., Levophed gtt. and bivalrudin gtt. Off epi gtt. Cards planning lasix x 1 today. 7/21 Ongoing fevers, WBC increased  Consults:  Advanced heart failure, PCCM, TCTS  Procedures:  LUE PICC 7/5 >>7/21 R Brachial ALine 7/9 >> Chest Tubes >> L IJ CVC 7/21 >>  Significant Diagnostic Tests:  6/22 spirometry with restrictive physiology, preserved FEV1/FVC 6/18 echo: LVEF 62-13%, grade I diastolic dysfunction 0/86 US Abdomen cholelithiasis without cholecystitis. Mild wall thinking in setting of liver disease and ascites.  7/10: LE [place] negative for DVT  7/20 CT ABD w contrast >> stable position of the percutaneous cholecystostomy tube with complete decompression of the gallbladder, trace residual free fluid within the RUQ, decreased since prior, trace bilateral pleural effusions L>R, scattered areas of ground glass airspace disease within the RML, RLL  Micro Data:  COVID and HIV 6/17 >> Neg 7/3 respiratory >> few Candida tropicalis 7/5 blood >> negative  7/5 respiratory >> few Candida tropicalis 7/7 urine >> negative 7/7 blood >> negative  7/9 wound >> negative 7/12 BAL >> candida tropicalis, candida albicans 7/14 BCID >> candida parapsilosis   7/14 BCx2 >> candida parapsilosis  7/17 BCx2 >> NGTD 7/21 BCx2 >>   Antimicrobials:  Vanc 6/24>>7/13 Cefuroxime 6/24 >> 6/25 merrem 6/26 >> 7/14  Anidulafungin 7/8 >> 7/14 Zosyn 7/14 >>7/21 Diflucan 7/17 >> Vanco 7/22 Mero 7/22  Interim  history/subjective:  + 9 L I/O with net negative 14 L this admission. Persistent fevers, T Max 102 Levophed at 15 mcg/min Trach, does not follow commands for me this am, moving all extremities but difficult to assess for deficits.   Objective   Blood pressure (!) 124/55, pulse 91, temperature (!) 101.5 F (38.6 C), resp. rate (!) 21, height _0  (1.753 m), weight 67.2 kg, SpO2 99 %. CVP:  [7 mmHg-8 mmHg] 8 mmHg  Vent Mode: PRVC FiO2 (%):  [40 %] 40 % Set Rate:  [20 bmp] 20 bmp Vt Set:  [420 mL] 420 mL PEEP:  [5 cmH20] 5 cmH20 Plateau Pressure:  [15 cmH20-20 cmH20] 20 cmH20   Intake/Output Summary (Last 24 hours) at 08/30/2019 0729 Last data filed at 08/30/2019 0700 Gross per 24 hour  Intake 10449.63 ml  Output 1320 ml  Net 9129.63 ml   Filed Weights   08/28/19 0210 08/29/19 0438 08/30/19 0455  Weight: 70.4 kg 66 kg 67.2 kg   Exam General: Adult male with trach and mechanical ventilator  HEENT: MM dry, old blood noted, trach midline #6 Neuro Eyes open spontaneously, not following command, moving all extremities, not tracking CV: s1s2 RRR, SR on monitor, no m/r/g, midline sternal wound with staples, chest tubes x2 PULM: non-labored on vent, lungs bilaterally coarse, purulent secretions.  GI: soft, bsx4 active, cortrak in place  Extremities: warm/dry, no edema  Skin: no rashes or lesions on exposed skin.  Sacral decubitus approximately 3x3 inch site   Resolved Hospital Problem list     Assessment & Plan:   Critically ill due to cardiogenic/hemorrhagic shock status post CABG complicated by VT arrest,  S/p VA ECMO (decanulated 6/28). S/p impella placement (6/28> 7/9) -ASA -wean vasopressors for MAP >65. Currently on Levophed and weaning this am.  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  -Tenacious secretions via trach this am, ? Tracheobronchitis.  Respiratory culture sent  Deconditioning of Critical Illness  --will need PT/OT when appropriate  Hyperbilirubinemia due to acalculous cholecystitis Possible cirrhosis per 7/8 [place]  Status post  percutaneous GB drain  --IR assistancing with GB drain, continue until bilirubin normalizes --LFTs and bili are trending in appropriate direction  T2DM --Hyperglycemic overnight --Continue SSI, Novolog for tube feed coverage, increase Levemir to 8 units BID  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 --continue diflucan, will need 2 weeks antifungal coverage --Ophthamology has evaluated --Line exchange 7/21 with Repeat Blood Cx 7/21 pending, follow up results  Acute blood loss anemia and critical illness anemia Heparin-induced thrombocytopenia --Bivalirudin  --Hgb stable, no evidence of bleeding  Acute kidney injury --Continue supportive care, monitor UOP closely  Hypernatremia / Hyperchloremia , improving --Continue free water flushes 300 ml q 4 hour  Acute encephalopathy due to prolonged sedation --Minimize sedating medications --Delirium prevention measures  Sacral Decubitus  --Wound care  Small left adrenal mass seen on CT scan of the abdomen August 24, 2019  Fever --Broaden abx to Vanc/Mero --Incision sites to chest evaluated, no purulent secretions or erythema, right femoral site healthy appearance.  --CTH and chest to eval for source of fever, purulent secretions via ETT and suspect tracheobronchitis.   Daily Goals Checklist  Pain/Anxiety/Delirium  protocol (if indicated): fentanyl as needed only VAP protocol (if indicated): bundle in place.  DVT prophylaxis: Bivalirudin per pharmacy  Nutrition Status: High nutritional risk. Continue tube feeds.  GI prophylaxis: PPI Urinary catheter: n/a Central lines: L IJ CVC, Art Glucose control:  See above Mobility/therapy needs: PT for ROM Code Status: Full  Family Communication: per TCTS Disposition: ICU   LABS    PULMONARY Recent Labs  Lab 08/23/19 1805 08/24/19 0335 08/27/19 1100 08/28/19 0234 08/29/19 0222 08/29/19 1440 08/30/19 0453  PHART 7.519*  --   --   --   --  7.416  --   PCO2ART 52.0*  --   --   --   --  40.4  --   PO2ART 108  --   --   --   --  132*  --   HCO3 41.8*  --   --   --   --  25.7  --   TCO2 43*  --   --   --   --  27  --   O2SAT 98.0   < > 56.0 57.8 66.1 99.0 99.3   < > = values in this interval not displayed.    CBC Recent Labs  Lab 08/28/19 0234 08/28/19 0234 08/29/19 0222 08/29/19 1440 08/30/19 0453  HGB 7.4*   < > 8.0* 7.8* 7.0*  HCT 26.0*   < > 27.7* 23.0* 25.1*  WBC 24.2*  --  29.7*  --  25.7*  PLT 362  --  409*  --  403*   < > = values in this interval not displayed.    COAGULATION No results for input(s): INR in the last 168 hours.  CARDIAC  No results for input(s): TROPONINI in the last 168 hours. No results for input(s): PROBNP in the last 168 hours.   CHEMISTRY Recent Labs  Lab 08/25/19 0801 08/25/19 1419 08/25/19 1553 08/25/19 1553 08/26/19 0032 08/26/19 0032 08/26/19 0228 08/26/19 0228 08/26/19 1727 08/26/19 1727 08/27/19 0218 08/27/19 0218 08/27/19 1600 08/27/19 1600 08/28/19 0234 08/28/19 0234 08/28/19 1700 08/28/19 1700 08/29/19 0222 08/29/19 0222 08/29/19 1440 08/29/19 1440 08/29/19 1630 08/30/19 0513  NA 152*   < > 151*   < > 147*   < > 148*   < > 146*   < > 143   < > 144   < > 149*   < > 148*  --  150*  --  153*  --  152* 147*  K 3.8   < > 4.2   < > 4.6   < > 3.8   < > 3.9   < > 4.2   < > 4.0   < > 4.0   < > 3.9   < > 4.4   < > 4.8   < > 4.7 4.8  CL 109   < > 110   < > 109   < > 109   < > 111   < > 109   < > 112*   < > 113*  --  115*  --  116*  --   --   --  120* 118*  CO2 31   < > 24   < > 28   < > 29   < > 26   < > 25   < > 24   < > 24  --  25  --  25  --   --   --  24 22  GLUCOSE  174*   < > 196*   < > 234*   < > 170*   < > 185*   < > 114*   < > 220*   < > 77  --  193*  --  199*  --   --   --  218* 295*  BUN 77*   < > 76*   < > 65*   < > 65*   < > 51*   < > 46*   < > 43*   < > 40*  --  43*  --  48*  --   --   --  55* 58*  CREATININE 1.71*   < > 1.57*   < > 1.42*   < > 1.45*   < > 1.18   < > 1.09   < > 1.08   < > 1.08  --  1.13  --  1.16  --   --   --  1.16 1.38*  CALCIUM 8.5*   < > 8.5*   < > 8.3*   < > 8.2*   < > 8.3*   < > 8.1*   < > 8.1*   < > 7.6*  --  7.8*  --  8.6*  --   --   --  8.5* 8.4*  MG 2.5*  --   --   --  2.4  --  2.4  --   --   --  2.2  --   --   --   --   --   --   --  2.3  --   --   --   --   --   PHOS  --    < > 4.4  --   --   --   --   --  3.7  --   --   --  3.5  --   --   --  3.8  --   --   --   --   --  3.6  --    < > = values in this interval not displayed.   Estimated Creatinine Clearance: 50.7 mL/min (A) (by C-G formula based on SCr of 1.38 mg/dL (H)).   LIVER Recent Labs  Lab 08/26/19 0228 08/26/19 1727 08/27/19 0218 08/27/19 1600 08/28/19 0234 08/28/19 1700 08/29/19 0222 08/29/19 1630 08/30/19 0513  AST 166*  --  129*  --  96*  --  103*  --  91*  ALT 163*  --  139*  --  107*  --  113*  --  106*  ALKPHOS 576*  --  496*  --  395*  --  449*  --  350*  BILITOT 12.0*  --  10.2*  --  8.7*  --  9.0*  --  7.3*  PROT 6.1*  --  5.7*  --  6.8  --  5.9*  --  5.7*  ALBUMIN 2.3*   < > 2.1*   < > 1.9* 2.0* 2.1* 1.9* 1.9*   < > = values in this interval not displayed.     INFECTIOUS No results for input(s): LATICACIDVEN, PROCALCITON in the last 168 hours.   ENDOCRINE CBG (last 3)  Recent Labs    08/29/19 2034 08/30/19 0025 08/30/19 0436  GLUCAP 210* 243* 256*    CRITICAL CARE   Critical Care Time: 40  minutes    Paulita Fujita, ACNP [facility] 08/30/2019, 7:29 AM  Please see [email or web address] for pager details.

## 2019-08-30 NOTE — Progress Notes (Signed)
13 Days Post-Op Procedure(s) (LRB): REMOVAL OF IMPELLA LEFT VENTRICULAR ASSIST DEVICE (N/A) TRANSESOPHAGEAL ECHOCARDIOGRAM (TEE) (N/A) Subjective: About the same as yesterday  Objective: Vital signs in last 24 hours: Temp:  [100.8 F (38.2 C)-102 F (38.9 C)] 101.5 F (38.6 C) (07/22 0700) Pulse Rate:  [85-96] 89 (07/22 0753) Cardiac Rhythm: Normal sinus rhythm (07/21 2000) Resp:  [21-32] 28 (07/22 0753) BP: (55-139)/(28-86) 124/55 (07/22 0700) SpO2:  [98 %-100 %] 100 % (07/22 0753) Arterial Line BP: (84-131)/(37-65) 116/59 (07/22 0700) FiO2 (%):  [40 %] 40 % (07/22 0753) Weight:  [67.2 kg] 67.2 kg (07/22 0455)  Hemodynamic parameters for last 24 hours: CVP:  [8 mmHg] 8 mmHg  Intake/Output from previous day: 07/21 0701 - 07/22 0700 In: 10449.6 [I.V.:478.1; TM/HD:6222.9; IV Piggyback:233.9] Out: 1320 [Urine:745; Drains:85; Stool:300; Chest Tube:190] Intake/Output this shift: No intake/output data recorded.  General appearance: ill Neurologic: eyes open and some spont movement but not following commands Heart: regular rate and rhythm Lungs: rhonchi bilaterally Abdomen: normal findings: soft, non-tender Wound: clean intact  Lab Results: Recent Labs    08/29/19 0222 08/29/19 0222 08/29/19 1440 08/30/19 0453  WBC 29.7*  --   --  25.7*  HGB 8.0*   < > 7.8* 7.0*  HCT 27.7*   < > 23.0* 25.1*  PLT 409*  --   --  403*   < > = values in this interval not displayed.   BMET:  Recent Labs    08/29/19 1630 08/30/19 0513  NA 152* 147*  K 4.7 4.8  CL 120* 118*  CO2 24 22  GLUCOSE 218* 295*  BUN 55* 58*  CREATININE 1.16 1.38*  CALCIUM 8.5* 8.4*    PT/INR: No results for input(s): LABPROT, INR in the last 72 hours. ABG    Component Value Date/Time   PHART 7.416 08/29/2019 1440   HCO3 25.7 08/29/2019 1440   TCO2 27 08/29/2019 1440   ACIDBASEDEF 1.0 08/08/2019 1115   O2SAT 99.3 08/30/2019 0453   CBG (last 3)  Recent Labs    08/30/19 0025 08/30/19 0436  08/30/19 0729  GLUCAP 243* 256* 194*    Assessment/Plan: S/P Procedure(s) (LRB): REMOVAL OF IMPELLA LEFT VENTRICULAR ASSIST DEVICE (N/A) TRANSESOPHAGEAL ECHOCARDIOGRAM (TEE) (N/A) - Remains critically ill Febrile overnight Cv- in SR, co-ox not done this AM  On milrinone 0.125, levophed up slightly to 15 ID- febrile to 102, WBC down slightly but still very elevated  Only on Diflucan at this point  RN noted thick tracheal secretions- will culture  I would favor resuming broad spectrum Abx, will defer to ID RESP- VDRF, secretions as noted above RENAL- creatinine up slightly ENDO- CBG elevated along with fevers Anemia- stable NEURO- mental status unchanged, worsened along with fevers, non focal , suspect TME    LOS: 35 days    [name] 08/30/2019

## 2019-08-30 NOTE — Progress Notes (Signed)
Subjective:  Patient  obtunded  Antibiotics:  Anti-infectives (From admission, onward)   Start     Dose/Rate Route Frequency Ordered Stop   08/30/19 0930  meropenem (MERREM) 1 g in sodium chloride 0.9 % 100 mL IVPB     Discontinue     1 g 200 mL/hr over 30 Minutes Intravenous Every 8 hours 08/30/19 0917     08/30/19 0930  vancomycin (VANCOREADY) IVPB 1500 mg/300 mL        1,500 mg 150 mL/hr over 120 Minutes Intravenous  Once 08/30/19 0918 08/30/19 1152   08/25/19 0200  fluconazole (DIFLUCAN) IVPB 400 mg     Discontinue     400 mg 100 mL/hr over 120 Minutes Intravenous Every 24 hours 08/25/19 0146     08/22/19 1400  piperacillin-tazobactam (ZOSYN) IVPB 3.375 g  Status:  Discontinued        3.375 g 12.5 mL/hr over 240 Minutes Intravenous Every 8 hours 08/22/19 1032 08/29/19 1021   08/20/19 2200  vancomycin (VANCOREADY) IVPB 750 mg/150 mL  Status:  Discontinued        750 mg 150 mL/hr over 60 Minutes Intravenous Every 24 hours 08/19/19 1623 08/20/19 0758   08/20/19 0900  vancomycin (VANCOREADY) IVPB 750 mg/150 mL  Status:  Discontinued        750 mg 150 mL/hr over 60 Minutes Intravenous Every 24 hours 08/20/19 0758 08/21/19 0852   08/16/19 1400  vancomycin (VANCOCIN) IVPB 1000 mg/200 mL premix  Status:  Discontinued        1,000 mg 200 mL/hr over 60 Minutes Intravenous Every 24 hours 08/16/19 1108 08/19/19 1623   08/16/19 0745  anidulafungin ([name]) 100 mg in sodium chloride 0.9 % 100 mL IVPB  Status:  Discontinued       "Followed by" Linked Group Details   100 mg 78 mL/hr over 100 Minutes Intravenous Every 24 hours 08/15/19 0741 08/22/19 1032   08/15/19 0730  anidulafungin ([name]) 200 mg in sodium chloride 0.9 % 200 mL IVPB       "Followed by" Linked Group Details   200 mg 78 mL/hr over 200 Minutes Intravenous  Once 08/15/19 0741 08/15/19 1346   08/13/19 1400  meropenem (MERREM) 1 g in sodium chloride 0.9 % 100 mL IVPB  Status:  Discontinued        1 g 200 mL/hr  over 30 Minutes Intravenous Every 8 hours 08/13/19 1236 08/22/19 1032   08/13/19 1400  vancomycin (VANCOREADY) IVPB 1500 mg/300 mL  Status:  Discontinued        1,500 mg 150 mL/hr over 120 Minutes Intravenous Every 24 hours 08/13/19 1244 08/16/19 1108   08/10/19 1504  vancomycin (VANCOCIN) 1,000 mg in sodium chloride 0.9 % 1,000 mL irrigation  Status:  Discontinued          As needed 08/10/19 1505 08/10/19 1840   08/10/19 1500  vancomycin (VANCOCIN) 1,000 mg in sodium chloride 0.9 % 1,000 mL irrigation  Status:  Discontinued         Irrigation To Surgery 08/10/19 1448 08/10/19 1453   08/10/19 1500  vancomycin (VANCOCIN) 1,000 mg in sodium chloride 0.9 % 1,000 mL irrigation  Status:  Discontinued         Irrigation To Surgery 08/10/19 1454 08/10/19 1855   08/10/19 0800  vancomycin (VANCOREADY) IVPB 1750 mg/350 mL        1,750 mg 175 mL/hr over 120 Minutes Intravenous Every 36 hours 08/09/19 1426 08/11/19 2356  08/08/19 2232  vancomycin variable dose per unstable renal function (pharmacist dosing)  Status:  Discontinued         Does not apply See admin instructions 08/08/19 2232 08/09/19 1426   08/06/19 1827  vancomycin (VANCOCIN) powder  Status:  Discontinued          As needed 08/06/19 1828 08/06/19 1936   08/06/19 1503  vancomycin (VANCOCIN) 1,000 mg in sodium chloride 0.9 % 1,000 mL irrigation  Status:  Discontinued          As needed 08/06/19 1504 08/06/19 1936   08/04/19 2100  vancomycin (VANCOCIN) IVPB 1000 mg/200 mL premix  Status:  Discontinued        1,000 mg 200 mL/hr over 60 Minutes Intravenous Every 12 hours 08/04/19 0721 08/08/19 2225   08/04/19 0730  vancomycin (VANCOREADY) IVPB 2000 mg/400 mL        2,000 mg 200 mL/hr over 120 Minutes Intravenous  Once 08/04/19 0721 08/04/19 1003   08/04/19 0730  meropenem (MERREM) 1 g in sodium chloride 0.9 % 100 mL IVPB        1 g 200 mL/hr over 30 Minutes Intravenous Every 8 hours 08/04/19 0721 08/12/19 2220   08/02/19 2030  vancomycin  (VANCOCIN) IVPB 1000 mg/200 mL premix        1,000 mg 200 mL/hr over 60 Minutes Intravenous  Once 08/02/19 1516 08/02/19 2151   08/02/19 1530  cefUROXime (ZINACEF) 1.5 g in sodium chloride 0.9 % 100 mL IVPB        1.5 g 200 mL/hr over 30 Minutes Intravenous Every 12 hours 08/02/19 1516 08/04/19 0430   08/02/19 0400  vancomycin (VANCOREADY) IVPB 1250 mg/250 mL        1,250 mg 166.7 mL/hr over 90 Minutes Intravenous To Surgery 08/01/19 1422 08/02/19 0923   08/02/19 0400  cefUROXime (ZINACEF) 1.5 g in sodium chloride 0.9 % 100 mL IVPB        1.5 g 200 mL/hr over 30 Minutes Intravenous To Surgery 08/01/19 1422 08/02/19 0853   08/02/19 0400  cefUROXime (ZINACEF) 750 mg in sodium chloride 0.9 % 100 mL IVPB        750 mg 200 mL/hr over 30 Minutes Intravenous To Surgery 08/01/19 1422 08/02/19 1430   08/01/19 0400  vancomycin (VANCOREADY) IVPB 1250 mg/250 mL  Status:  Discontinued        1,250 mg 166.7 mL/hr over 90 Minutes Intravenous To Surgery 07/31/19 1034 08/01/19 1421   08/01/19 0400  cefUROXime (ZINACEF) 1.5 g in sodium chloride 0.9 % 100 mL IVPB  Status:  Discontinued        1.5 g 200 mL/hr over 30 Minutes Intravenous To Surgery 07/31/19 1034 08/01/19 1421   08/01/19 0400  cefUROXime (ZINACEF) 750 mg in sodium chloride 0.9 % 100 mL IVPB  Status:  Discontinued        750 mg 200 mL/hr over 30 Minutes Intravenous To Surgery 07/31/19 1034 08/01/19 1421      Medications: Scheduled Meds: . sodium chloride   Intravenous Once  . amiodarone  200 mg Per Tube BID  . aspirin  81 mg Per Tube Daily  . chlorhexidine gluconate (MEDLINE KIT)  15 mL Mouth Rinse BID  . Chlorhexidine Gluconate Cloth  6 each Topical Daily  . cholestyramine  4 g Oral BID  . collagenase   Topical Daily  . docusate  200 mg Per Tube Daily  . feeding supplement (PROSource TF)  45 mL Per Tube Daily  .  free water  300 mL Per Tube Q4H  . insulin aspart  0-24 Units Subcutaneous Q4H  . insulin aspart  5 Units Subcutaneous  Q4H  . insulin detemir  8 Units Subcutaneous BID  . ipratropium  0.5 mg Nebulization TID BM  . levalbuterol  1.25 mg Nebulization TID BM  . mouth rinse  15 mL Mouth Rinse 10 times per day  . midodrine  15 mg Per Tube TID WC  . nutrition supplement (JUVEN)  1 packet Per Tube BID BM  . sodium chloride flush  3 mL Intravenous Q12H  . sodium chloride flush  5 mL Intracatheter Q8H   Continuous Infusions: . sodium chloride 250 mL (08/13/19 1412)  . sodium chloride Stopped (08/13/19 1631)  . bivalirudin (ANGIOMAX) infusion 0.5 mg/mL (Non-ACS indications) 0.055 mg/kg/hr (08/30/19 1200)  . feeding supplement (PIVOT 1.5 CAL) 1,000 mL (08/29/19 1704)  . fluconazole (DIFLUCAN) IV Stopped (08/30/19 0348)  . lactated ringers 20 mL/hr at 08/10/19 1705  . lactated ringers 10 mL/hr at 08/18/19 0054  . meropenem (MERREM) IV 200 mL/hr at 08/30/19 1200  . milrinone 0.125 mcg/kg/min (08/30/19 1200)  . norepinephrine (LEVOPHED) Adult infusion 12 mcg/min (08/30/19 1200)   PRN Meds:.sodium chloride, sodium chloride, acetaminophen, dextrose, fentaNYL (SUBLIMAZE) injection, lip balm, metoprolol tartrate, ondansetron (ZOFRAN) IV, sodium chloride flush    Objective: Weight change: 1.2 kg  Intake/Output Summary (Last 24 hours) at 08/30/2019 1245 Last data filed at 08/30/2019 1200 Gross per 24 hour  Intake 3474.39 ml  Output 1075 ml  Net 2399.39 ml   Blood pressure (!) 139/60, pulse 87, temperature (!) 101.1 F (38.4 C), resp. rate (!) 28, height _0  (1.753 m), weight 67.2 kg, SpO2 99 %. Temp:  [100.8 F (38.2 C)-102 F (38.9 C)] 101.1 F (38.4 C) (07/22 1215) Pulse Rate:  [76-96] 87 (07/22 1215) Resp:  [18-32] 28 (07/22 1215) BP: (78-139)/(46-86) 139/60 (07/22 1200) SpO2:  [96 %-100 %] 99 % (07/22 1215) Arterial Line BP: (84-143)/(37-65) 110/50 (07/22 1215) FiO2 (%):  [40 %] 40 % (07/22 1101) Weight:  [67.2 kg] 67.2 kg (07/22 0455)  Physical Exam: General:confused HEENT: icteric sclera,  EOMI CVS regular rate,  Chest: , Rhonchorous Abdomen: Biliary drain with bilious material Extremities: Jaundiced  Neuro: nonfocal  CBC:    BMET Recent Labs    08/29/19 1630 08/30/19 0513  NA 152* 147*  K 4.7 4.8  CL 120* 118*  CO2 24 22  GLUCOSE 218* 295*  BUN 55* 58*  CREATININE 1.16 1.38*  CALCIUM 8.5* 8.4*     Liver Panel  Recent Labs    08/27/19 1651 08/28/19 0234 08/29/19 0222 08/29/19 0222 08/29/19 1630 08/30/19 0513  PROT  --    < > 5.9*  --   --  5.7*  ALBUMIN  --    < > 2.1*   < > 1.9* 1.9*  AST  --    < > 103*  --   --  91*  ALT  --    < > 113*  --   --  106*  ALKPHOS  --    < > 449*  --   --  350*  BILITOT  --    < > 9.0*  --   --  7.3*  BILIDIR 6.4*  --   --   --   --   --    < > = values in this interval not displayed.       Sedimentation Rate No results for input(s):  ESRSEDRATE in the last 72 hours. C-Reactive Protein No results for input(s): CRP in the last 72 hours.  Micro Results: Recent Results (from the past 720 hour(s))  Surgical pcr screen     Status: None   Collection Time: 08/01/19 10:07 PM   Specimen: Nasal Mucosa; Nasal Swab  Result Value Ref Range Status   MRSA, PCR NEGATIVE NEGATIVE Final   Staphylococcus aureus NEGATIVE NEGATIVE Final    Comment: (NOTE) The Xpert SA Assay (FDA approved for NASAL specimens in patients 68 years of age and older), is one component of a comprehensive surveillance program. It is not intended to diagnose infection nor to guide or monitor treatment. Performed at [facility], [place] [address] [place], [place] [address]   Culture, respiratory (non-expectorated)     Status: None   Collection Time: 08/11/19  3:32 PM   Specimen: Tracheal Aspirate; Respiratory  Result Value Ref Range Status   Specimen Description TRACHEAL ASPIRATE  Final   Special Requests Normal  Final   Gram Stain   Final    NO WBC SEEN NO SQUAMOUS EPITHELIAL CELLS SEEN RARE BUDDING YEAST SEEN Performed at [facility], [address] [place], [place] [address]    Culture FEW CANDIDA TROPICALIS  Final   Report Status 08/13/2019 FINAL  Final  Culture, blood (Routine X 2) w Reflex to ID Panel     Status: None   Collection Time: 08/13/19  8:27 AM   Specimen: BLOOD  Result Value Ref Range Status   Specimen Description BLOOD BLOOD LEFT ARM  Final   Special Requests   Final    BOTTLES DRAWN AEROBIC AND ANAEROBIC Blood Culture adequate volume   Culture   Final    NO GROWTH 5 DAYS Performed at [facility], [place] [address] [place], [place] [address]    Report Status 08/18/2019 FINAL  Final  Culture, blood (Routine X 2) w Reflex to ID Panel     Status: None   Collection Time: 08/13/19  8:31 AM   Specimen: BLOOD  Result Value Ref Range Status   Specimen Description BLOOD BLOOD LEFT HAND  Final   Special Requests   Final    BOTTLES DRAWN AEROBIC AND ANAEROBIC Blood Culture adequate volume   Culture   Final    NO GROWTH 5 DAYS Performed at [facility], [place] [address] [place], [place] [address]    Report Status 08/18/2019 FINAL  Final  Culture, respiratory (non-expectorated)     Status: None   Collection Time: 08/13/19  4:12 PM   Specimen: Tracheal Aspirate; Respiratory  Result Value Ref Range Status   Specimen Description TRACHEAL ASPIRATE  Final   Special Requests NONE  Final   Gram Stain   Final    NO WBC SEEN FEW YEAST Performed at [facility], [place] [address], [place], [place] [address]    Culture FEW CANDIDA TROPICALIS  Final   Report Status 08/16/2019 FINAL  Final  Culture, Urine     Status: None   Collection Time: 08/15/19  6:13 PM   Specimen: Urine, Catheterized  Result Value Ref Range Status   Specimen Description URINE, CATHETERIZED  Final   Special Requests NONE  Final   Culture   Final    NO GROWTH Performed at [facility], [address] [place],  [address]    Report Status 08/17/2019 FINAL  Final  Culture, blood (routine x 2)  Status: None   Collection Time: 08/15/19  7:53 PM   Specimen: BLOOD RIGHT HAND  Result Value Ref Range Status   Specimen Description BLOOD RIGHT HAND  Final   Special Requests   Final    BOTTLES DRAWN AEROBIC AND ANAEROBIC Blood Culture adequate volume   Culture   Final    NO GROWTH 5 DAYS Performed at [facility], [address] [place], [place] [address]    Report Status 08/20/2019 FINAL  Final  Culture, blood (routine x 2)     Status: None   Collection Time: 08/15/19  7:53 PM   Specimen: BLOOD RIGHT HAND  Result Value Ref Range Status   Specimen Description BLOOD RIGHT HAND  Final   Special Requests   Final    BOTTLES DRAWN AEROBIC AND ANAEROBIC Blood Culture adequate volume   Culture   Final    NO GROWTH 5 DAYS Performed at [facility], [place] [address], [place], [place] [address]    Report Status 08/20/2019 FINAL  Final  Aerobic/Anaerobic Culture (surgical/deep wound)     Status: None   Collection Time: 08/17/19  2:32 PM   Specimen: Wound  Result Value Ref Range Status   Specimen Description WOUND  Final   Special Requests NONE  Final   Gram Stain NO WBC SEEN NO ORGANISMS SEEN   Final   Culture   Final    No growth aerobically or anaerobically. Performed at [facility], [place] [address] [place], [place] [address]    Report Status 08/22/2019 FINAL  Final  Culture, fungus without smear     Status: Abnormal (Preliminary result)   Collection Time: 08/20/19  2:00 PM   Specimen: Bronchoalveolar Lavage; Other  Result Value Ref Range Status   Specimen Description BRONCHIAL ALVEOLAR LAVAGE  Final   Special Requests   Final    Normal Performed at [facility], [place] [address], [place], [place] [address]    Culture CANDIDA ALBICANS (A)  Final   Report Status PENDING  Incomplete  Culture, bal-quantitative     Status: Abnormal   Collection Time: 08/20/19  2:00 PM   Specimen: Bronchoalveolar Lavage; Respiratory  Result Value Ref Range Status    Specimen Description BRONCHIAL ALVEOLAR LAVAGE  Final   Special Requests NONE  Final   Gram Stain   Final    FEW WBC PRESENT, PREDOMINANTLY PMN NO ORGANISMS SEEN Performed at [facility], [address], [place], [place] [address]    Culture (A)  Final    4,000 COLONIES/mL CANDIDA TROPICALIS 3,000 COLONIES/mL CANDIDA ALBICANS    Report Status 08/25/2019 FINAL  Final  Body fluid culture     Status: None   Collection Time: 08/22/19  9:14 AM   Specimen: Pleura; Body Fluid  Result Value Ref Range Status   Specimen Description PLEURAL  Final   Special Requests NONE  Final   Gram Stain   Final    NO WBC SEEN NO ORGANISMS SEEN Performed at [facility], [address] [place], [place] [address]    Culture RARE CANDIDA PARAPSILOSIS  Final   Report Status 08/25/2019 FINAL  Final  Culture, blood (routine x 2)     Status: Abnormal   Collection Time: 08/22/19 12:58 PM   Specimen: BLOOD RIGHT HAND  Result Value Ref Range Status   Specimen Description BLOOD RIGHT HAND  Final   Special Requests   Final    BOTTLES DRAWN  AEROBIC ONLY Blood Culture adequate volume   Culture  Setup Time   Final    AEROBIC BOTTLE ONLY YEAST CRITICAL RESULT CALLED TO, READ BACK BY AND VERIFIED WITH: Karsten Ro [facility] 08/25/19 0123 JDW Performed at [facility], [address], [place], [place] [address]    Culture CANDIDA PARAPSILOSIS (A)  Final   Report Status 08/26/2019 FINAL  Final  Culture, blood (routine x 2)     Status: None   Collection Time: 08/22/19 12:58 PM   Specimen: BLOOD RIGHT HAND  Result Value Ref Range Status   Specimen Description BLOOD RIGHT HAND  Final   Special Requests   Final    BOTTLES DRAWN AEROBIC ONLY Blood Culture adequate volume   Culture   Final    NO GROWTH 5 DAYS Performed at [facility], [place] [address] [place] [place], [place] [address]    Report Status 08/27/2019 FINAL  Final  Blood Culture ID Panel (Reflexed)     Status: Abnormal   Collection Time:  08/22/19 12:58 PM  Result Value Ref Range Status   Enterococcus species NOT DETECTED NOT DETECTED Final   Listeria monocytogenes NOT DETECTED NOT DETECTED Final   Staphylococcus species NOT DETECTED NOT DETECTED Final   Staphylococcus aureus (BCID) NOT DETECTED NOT DETECTED Final   Streptococcus species NOT DETECTED NOT DETECTED Final   Streptococcus agalactiae NOT DETECTED NOT DETECTED Final   Streptococcus pneumoniae NOT DETECTED NOT DETECTED Final   Streptococcus pyogenes NOT DETECTED NOT DETECTED Final   Acinetobacter baumannii NOT DETECTED NOT DETECTED Final   Enterobacteriaceae species NOT DETECTED NOT DETECTED Final   Enterobacter cloacae complex NOT DETECTED NOT DETECTED Final   Escherichia coli NOT DETECTED NOT DETECTED Final   Klebsiella oxytoca NOT DETECTED NOT DETECTED Final   Klebsiella pneumoniae NOT DETECTED NOT DETECTED Final   Proteus species NOT DETECTED NOT DETECTED Final   Serratia marcescens NOT DETECTED NOT DETECTED Final   Haemophilus influenzae NOT DETECTED NOT DETECTED Final   Neisseria meningitidis NOT DETECTED NOT DETECTED Final   Pseudomonas aeruginosa NOT DETECTED NOT DETECTED Final   Candida albicans NOT DETECTED NOT DETECTED Final   Candida glabrata NOT DETECTED NOT DETECTED Final   Candida krusei NOT DETECTED NOT DETECTED Final   Candida parapsilosis DETECTED (A) NOT DETECTED Final    Comment: CRITICAL RESULT CALLED TO, READ BACK BY AND VERIFIED WITH: J LEDFORD PHARMD 08/25/19 0123 JDW    Candida tropicalis NOT DETECTED NOT DETECTED Final    Comment: Performed at [facility], [address], [place], [place] [address]  Aerobic/Anaerobic Culture (surgical/deep wound)     Status: None   Collection Time: 08/24/19  1:35 PM   Specimen: Abscess  Result Value Ref Range Status   Specimen Description ABSCESS  Final   Special Requests DRAIN  Final   Gram Stain   Final    RARE WBC PRESENT, PREDOMINANTLY PMN NO ORGANISMS SEEN    Culture   Final    No  growth aerobically or anaerobically. Performed at [facility], [place] [address], [place], [place] [address]    Report Status 08/29/2019 FINAL  Final  Culture, blood (routine x 2)     Status: None (Preliminary result)   Collection Time: 08/25/19  3:53 PM   Specimen: BLOOD RIGHT HAND  Result Value Ref Range Status   Specimen Description BLOOD RIGHT HAND  Final   Special Requests   Final    BOTTLES DRAWN AEROBIC AND ANAEROBIC  Blood Culture adequate volume   Culture   Final    NO GROWTH 4 DAYS Performed at [place] [address] [place], [place] [address]    Report Status PENDING  Incomplete  Culture, blood (routine x 2)     Status: None (Preliminary result)   Collection Time: 08/25/19  3:53 PM   Specimen: BLOOD RIGHT ARM  Result Value Ref Range Status   Specimen Description BLOOD RIGHT ARM  Final   Special Requests   Final    BOTTLES DRAWN AEROBIC ONLY Blood Culture adequate volume   Culture   Final    NO GROWTH 4 DAYS Performed at [facility], [place] [address], [place], [place] [address]    Report Status PENDING  Incomplete    Studies/Results: CT ABDOMEN W CONTRAST  Result Date: 08/28/2019 CLINICAL DATA:  Fungemia, respiratory failure, acute cholecystitis with percutaneous cholecystostomy EXAM: CT ABDOMEN WITH CONTRAST TECHNIQUE: Multidetector CT imaging of the abdomen was performed using the standard protocol following bolus administration of intravenous contrast. CONTRAST:  158m OMNIPAQUE IOHEXOL 300 MG/ML  SOLN COMPARISON:  08/24/2019, 08/23/2019 FINDINGS: Lower chest: Right chest tube is unchanged. Continued dependent lower lobe atelectasis with small bilateral pleural effusions, left greater than right. Scattered areas of ground-glass airspace disease are seen within the right middle and right lower lobe, and could reflect hypoventilatory changes versus developing infection. Hepatobiliary: Stable position of the percutaneous cholecystostomy tube, with  complete decompression of the gallbladder. The liver is unremarkable. Pancreas: Unremarkable. No pancreatic ductal dilatation or surrounding inflammatory changes. Stable coarse calcifications within the uncinate process. Spleen: Normal in size without focal abnormality. Adrenals/Urinary Tract: Stable left adrenal nodule. The right adrenal is stable. Chronic scarring of the lower pole left kidney with compensatory hypertrophy of the right kidney. No urinary tract calculi or obstructive uropathy. Stomach/Bowel: No bowel obstruction or ileus. No wall thickening or inflammatory change. Enteric catheter is seen, tip extending into the proximal jejunum. Vascular/Lymphatic: There is extensive atherosclerosis throughout the abdominal aorta, stable. No pathologic adenopathy. Other: There is trace residual free fluid within the right upper quadrant. No free intraperitoneal gas. Musculoskeletal: No acute or destructive bony lesions. Reconstructed images demonstrate no additional findings. IMPRESSION: 1. Stable position of the percutaneous cholecystostomy tube, with complete decompression of the gallbladder. 2. Trace residual free fluid within the right upper quadrant, decreased since prior. 3. Trace bilateral pleural effusions, left greater than right. 4. Scattered areas of ground-glass airspace disease within the right middle and right lower lobe, which could reflect hypoventilatory changes versus developing infection. 5. Aortic Atherosclerosis (ICD10-I70.0). Electronically Signed   By: MRanda NgoM.D.   On: 08/28/2019 22:05   DG CHEST PORT 1 VIEW  Result Date: 08/30/2019 CLINICAL DATA:  Chest tube. EXAM: PORTABLE CHEST 1 VIEW COMPARISON:  08/27/2019. FINDINGS: Interim removal of left PICC line and placement of left IJ line. Left IJ line tip at cavoatrial junction. Tracheostomy tube and feeding tube in stable position. Bilateral chest tubes in stable position. Prior CABG. Stable cardiomegaly. Diffuse bilateral  interstitial prominence consistent with interstitial edema and/or pneumonitis again noted. Small right pleural effusion cannot be excluded. Left costophrenic angle not completely imaged. No pneumothorax. Surgical staples noted over the right chest. IMPRESSION: 1. Interim removal of left PICC line and placement of left IJ line. Left IJ line tip at cavoatrial junction. Tracheostomy tube and feeding tube in stable position. Bilateral chest tubes stable position. No pneumothorax. 2. Prior CABG. Stable cardiomegaly. Diffuse bilateral interstitial prominence consistent  interstitial edema and/or pneumonitis again noted. Similar findings noted on prior exam. Small right pleural effusion cannot be excluded. Left costophrenic angle incompletely imaged. Electronically Signed   By: Marcello Moores  Register   On: 08/30/2019 07:06   DG Chest Port 1 View  Result Date: 08/29/2019 CLINICAL DATA:  Central line placement EXAM: PORTABLE CHEST 1 VIEW COMPARISON:  Earlier same day FINDINGS: Left internal jugular central line tip in the SVC 2 cm above the right atrium. Left arm PICC tip at the SVC RA junction or just in the right atrium. No pneumothorax. Left chest tube remains in place. Soft feeding tube enters the abdomen. Tracheostomy in place. Atelectasis in the lower lungs persists, left worse than right. IMPRESSION: Left internal jugular central line tip in the SVC 2 cm above the right atrium. No pneumothorax. No other change. Electronically Signed   By: Nelson Chimes M.D.   On: 08/29/2019 17:16   DG CHEST PORT 1 VIEW  Result Date: 08/29/2019 CLINICAL DATA:  Hypoxia EXAM: PORTABLE CHEST 1 VIEW COMPARISON:  08/27/2019 FINDINGS: Tracheostomy tube and feeding catheter are again noted and stable. Postsurgical changes are again seen. Left-sided PICC line is noted and stable. Bilateral thoracostomy catheters are seen without evidence of pneumothorax. Stable left retrocardiac opacity is noted. The previously seen interstitial prominence has  improved in the interval from the prior exam. IMPRESSION: Persistent left retrocardiac opacity. Tubes and lines as described. Electronically Signed   By: Inez Catalina M.D.   On: 08/29/2019 15:31      Assessment/Plan:  INTERVAL HISTORY: Persistently febrile but I learned today that he had been on a cooling blanket through much of his afebrile.   Principal Problem:   Candidemia ([place]) Active Problems:   CHF (congestive heart failure) (HCC)   Chronic pain syndrome   Mixed hyperlipidemia   Moderate recurrent major depression (HCC)   Hypothyroidism   Acute systolic heart failure (HCC)   Type 2 diabetes mellitus with hyperglycemia (HCC)   AKI (acute kidney injury) ([place])   Coronary artery disease involving native coronary artery of native heart with unstable angina pectoris ([place])   Coronary artery disease   Malnutrition of moderate degree   Pressure injury of skin   Acute on chronic respiratory failure (HCC)   Acute respiratory failure (HCC)   S/P CABG (coronary artery bypass graft)   Acalculous cholecystitis   ARDS (adult respiratory distress syndrome) ([place])   Cardiogenic shock ([place])    Todd [name] is a 64 y.o. male with  complicated hospitalization following CABG that required [place] ECMO>>Impella support following several arrests and open chest procedures at the bedside. He has been on long term carbapenem up until recently when we switched him to zosyn and had gallbladder drain placed for rising bilirubinemia/cholangitis --> no obstructing stones on HIDA, ?Acalculous cholecystitis.   No growth from aspirated bile from perc-chole drain  He was found to have candida parapsilosis in blood    #1 candidemia:  Continue fluconazole  When able to discontinue central lines he should have a "line holiday with peripheral IV access and repeat blood cultures.  He should then have 2 weeks of antifungal therapy (which is currently not possible)  I appreciate ophthalmology seeing the patient.   Unfortunately their exam was compromised the patient's mental status and inability coordinate with them  #2 possible acalculous cholecystitis I doubt he has any active infection in the biliary tree at this point in time  #3  Fevers: It turns out that his fevers defervesced  seeing on Zosyn likely defervesced due to the presence of a cooling blanket.  He is having more secretions today which have been cultured Chest x-ray does not show any clear focal infiltrates.  I agree with CT of the chest and CT of the brain.  He certainly could have a central fever.  I am concerned with his extensive exposure to broad-spectrum antibiotics including Carbapenem's the will begin to harbor a multidrug-resistant gram-negative rod.  Hopefully cultures from the respiratory secretions will be revealing.  My understanding is he being started back on vancomycin meropenem   LOS: 35 days   Alcide Evener 08/30/2019, 12:45 PM

## 2019-08-30 NOTE — Progress Notes (Signed)
CRITICAL VALUE ALERT  Critical Value:  hgb 6.9  Date & Time Notied:  08/30/2019 1045  Provider Notified: Prescilla Sours NP  Orders Received/Actions taken: new orders received

## 2019-08-30 NOTE — Progress Notes (Signed)
13 Days Post-Op Procedure(s) (LRB): REMOVAL OF IMPELLA LEFT VENTRICULAR ASSIST DEVICE (N/A) TRANSESOPHAGEAL ECHOCARDIOGRAM (TEE) (N/A) Subjective: Sedated/encephalopathic  Objective: Vital signs in last 24 hours: Temp:  [100.8 F (38.2 C)-102 F (38.9 C)] 101.5 F (38.6 C) (07/22 1845) Pulse Rate:  [76-96] 85 (07/22 1918) Cardiac Rhythm: Normal sinus rhythm (07/22 1600) Resp:  [18-32] 22 (07/22 1918) BP: (78-139)/(46-86) 139/56 (07/22 1918) SpO2:  [96 %-100 %] 99 % (07/22 1918) Arterial Line BP: (91-143)/(38-72) 131/72 (07/22 1845) FiO2 (%):  [40 %] 40 % (07/22 1918) Weight:  [67.2 kg] 67.2 kg (07/22 0455)  Hemodynamic parameters for last 24 hours: CVP:  [8 mmHg] 8 mmHg  Intake/Output from previous day: 07/21 0701 - 07/22 0700 In: 10449.6 [I.V.:478.1; BC/WU:8891.6; IV Piggyback:233.9] Out: 1320 [Urine:745; Drains:85; Stool:300; Chest Tube:190] Intake/Output this shift: No intake/output data recorded.  General appearance: slowed mentation Neurologic: unable to assess fully Heart: regular rate and rhythm, S1, S2 normal, no murmur, click, rub or gallop Lungs: diminished breath sounds bibasilar Abdomen: soft, non-tender; bowel sounds normal; no masses,  no organomegaly Extremities: edema 1+ Wound: dressed, dry  Lab Results: Recent Labs    08/30/19 0453 08/30/19 0939  WBC 25.7* 27.0*  HGB 7.0* 6.9*  HCT 25.1* 25.3*  PLT 403* 399   BMET:  Recent Labs    08/30/19 0513 08/30/19 1549  NA 147* 148*  K 4.8 4.5  CL 118* 117*  CO2 22 22  GLUCOSE 295* 176*  BUN 58* 64*  CREATININE 1.38* 1.32*  CALCIUM 8.4* 8.4*    PT/INR: No results for input(s): LABPROT, INR in the last 72 hours. ABG    Component Value Date/Time   PHART 7.416 08/29/2019 1440   HCO3 25.7 08/29/2019 1440   TCO2 27 08/29/2019 1440   ACIDBASEDEF 1.0 08/08/2019 1115   O2SAT 99.3 08/30/2019 0453   CBG (last 3)  Recent Labs    08/30/19 0729 08/30/19 1147 08/30/19 1547  GLUCAP 194* 264* 145*    CT chest: no evidence of sternal infection; mild/small bilateral pleural effusions with associated atelectasis Assessment/Plan: S/P Procedure(s) (LRB): REMOVAL OF IMPELLA LEFT VENTRICULAR ASSIST DEVICE (N/A) TRANSESOPHAGEAL ECHOCARDIOGRAM (TEE) (N/A) continue fever work-up and support hemodynamically   LOS: 35 days    Todd [name] 08/30/2019

## 2019-08-30 NOTE — Progress Notes (Signed)
Transported pt to CT while on ventilator, tolerated well.

## 2019-08-30 NOTE — Progress Notes (Signed)
Pt found on weaning settings per pulmonologist.  Pt was noted to have increased work of breathing and respiratory rate greater than 48 breaths per minute.  Pt placed back in PRVC mode at this time.

## 2019-08-30 NOTE — Progress Notes (Signed)
Pharmacy Antibiotic Note  Todd [name] is a 64 y.o. male admitted on 07/26/2019 with CHF exacerbation. 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 VT arrest on 6/25 and underwent [place] ECMO. Decannulated on 6/28 and Impella placed. Chest closed on 7/2 and Imella removed 7/9. Cholecystostomy tube placed on 7/16.  Was on antibiotics initially for possible HCAP. No definite cholecystitis was seen on abd [place]. Now with candida in blood cultures and BAL. No growth from aspirated bile from perc-chole drain. PICC line and CL changed 7/21, patient continues to be febrile 101(time of afebrile documentation 7/20  in computer patient was actually on cooling blanket) WBC elevated 27 Sputum Cx and Blood Cx redrawn  Pharmacy has been consulted for fluconazole for candidemiia and add back Vancomycin and Meropenem.   Plan: Vancomycin 1536m IV x1 then [id number] Meropenem 1gm IV q8h Fluconazole 400 mg IV q24h  Follow cultures, renal function, needs fundoscopic exam  Height: _0  (175.3 cm) Weight: 67.2 kg (148 lb 2.4 oz) IBW/kg (Calculated) : 70.7  Temp (24hrs), Avg:101.2 F (38.4 C), Min:100.8 F (38.2 C), Max:102 F (38.9 C)  Recent Labs  Lab 08/27/19 0218 08/27/19 1600 08/28/19 0234 08/28/19 1700 08/29/19 0222 08/29/19 1630 08/30/19 0453 08/30/19 0513 08/30/19 0939  WBC 29.1*  --  24.2*  --  29.7*  --  25.7*  --  27.0*  CREATININE 1.09   < > 1.08 1.13 1.16 1.16  --  1.38*  --    < > = values in this interval not displayed.    Estimated Creatinine Clearance: 50.7 mL/min (A) (by C-G formula based on SCr of 1.38 mg/dL (H)).    Allergies  Allergen Reactions  . Empagliflozin     Other reaction(s): diarrhea  . 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 Other     Other reaction(s): Unknown  . Simvastatin     Other reaction(s): diarrhea  . Sitagliptin     Other reaction(s): diarrhea/abd pain    Antimicrobials this admission: Zosyn  7/14>>7/21 Fluconazole 7/17>> Cefuroxime 6/23>>6/25 Vancomycin 6/24>6/24; resume 6/26>>7/2, 7/5>>7/13, 7/22> Meropenem 6/26>>7/4, 7/5>>7/14, 7/22> [name] 7/7>>7/14  Dose adjustments thisdosing regimen: 6/30 VR 33, 7/1 22 (ke 0.026, T1/2 26.6) - change to vancomycin 1750 mg q36 7/7 VT = 19 (drawn prior to 3rd dose) - reduce to 1g IV every 24 hours 7/11 VT 21 - reduce to 750 q 24, retime for later tonight>start 7/12 AM  Microbiology results: 7/3+7/5 TA - few yeast> few candida topicalis  7/5 + 7/7 Bcx: neg 7/7 Ucx: neg 7/9 Wound Cx: neg 7/12 BAL: candida albicans, candida tropicalis 7/14 pleural fluid: rare candida parapsilosis 7/14 Blood>>>candida parapsilosis  7/16 abscess: ngF 7/17 BCx: ngtd 7/22 Bcx 7/22 TA   LiBonnita Nasutiharm.D. CPP, BCPS Clinical Pharmacist [id number]/22/2021 2:11 PM    Please check [email or web address] for unit-specific pharmacist phone numbers

## 2019-08-31 ENCOUNTER — Inpatient Hospital Stay (HOSPITAL_COMMUNITY): Payer: Medicare Other

## 2019-08-31 DIAGNOSIS — R509 Fever, unspecified: Secondary | ICD-10-CM

## 2019-08-31 DIAGNOSIS — J9622 Acute and chronic respiratory failure with hypercapnia: Secondary | ICD-10-CM

## 2019-08-31 DIAGNOSIS — J9621 Acute and chronic respiratory failure with hypoxia: Secondary | ICD-10-CM | POA: Diagnosis not present

## 2019-08-31 DIAGNOSIS — I5030 Unspecified diastolic (congestive) heart failure: Secondary | ICD-10-CM | POA: Diagnosis not present

## 2019-08-31 DIAGNOSIS — B377 Candidal sepsis: Secondary | ICD-10-CM | POA: Diagnosis not present

## 2019-08-31 DIAGNOSIS — R569 Unspecified convulsions: Secondary | ICD-10-CM | POA: Diagnosis not present

## 2019-08-31 DIAGNOSIS — R4182 Altered mental status, unspecified: Secondary | ICD-10-CM

## 2019-08-31 DIAGNOSIS — G894 Chronic pain syndrome: Secondary | ICD-10-CM | POA: Diagnosis not present

## 2019-08-31 LAB — CBC WITH DIFFERENTIAL/PLATELET
Abs Immature Granulocytes: 0.45 10*3/uL — ABNORMAL HIGH (ref 0.00–0.07)
Basophils Absolute: 0.1 10*3/uL (ref 0.0–0.1)
Basophils Relative: 0 %
Eosinophils Absolute: 0.1 10*3/uL (ref 0.0–0.5)
Eosinophils Relative: 0 %
HCT: 27.7 % — ABNORMAL LOW (ref 39.0–52.0)
Hemoglobin: 7.8 g/dL — ABNORMAL LOW (ref 13.0–17.0)
Immature Granulocytes: 2 %
Lymphocytes Relative: 5 %
Lymphs Abs: 1.4 10*3/uL (ref 0.7–4.0)
MCH: 30.1 pg (ref 26.0–34.0)
MCHC: 28.2 g/dL — ABNORMAL LOW (ref 30.0–36.0)
MCV: 106.9 fL — ABNORMAL HIGH (ref 80.0–100.0)
Monocytes Absolute: 1.3 10*3/uL — ABNORMAL HIGH (ref 0.1–1.0)
Monocytes Relative: 5 %
Neutro Abs: 23.4 10*3/uL — ABNORMAL HIGH (ref 1.7–7.7)
Neutrophils Relative %: 88 %
Platelets: 364 10*3/uL (ref 150–400)
RBC: 2.59 MIL/uL — ABNORMAL LOW (ref 4.22–5.81)
RDW: 23.7 % — ABNORMAL HIGH (ref 11.5–15.5)
WBC: 27 10*3/uL — ABNORMAL HIGH (ref 4.0–10.5)
nRBC: 0.3 % — ABNORMAL HIGH (ref 0.0–0.2)

## 2019-08-31 LAB — COOXEMETRY PANEL
Carboxyhemoglobin: 2.3 % — ABNORMAL HIGH (ref 0.5–1.5)
Carboxyhemoglobin: 2.1 % — ABNORMAL HIGH (ref 0.5–1.5)
Carboxyhemoglobin: 1.7 % — ABNORMAL HIGH (ref 0.5–1.5)
Methemoglobin: 1.3 % (ref 0.0–1.5)
Methemoglobin: 1 % (ref 0.0–1.5)
Methemoglobin: 0.7 % (ref 0.0–1.5)
O2 Saturation: 68.1 %
O2 Saturation: 62.4 %
O2 Saturation: 65.6 %
Total hemoglobin: 7.8 g/dL — ABNORMAL LOW (ref 12.0–16.0)
Total hemoglobin: 12.1 g/dL (ref 12.0–16.0)
Total hemoglobin: 12 g/dL (ref 12.0–16.0)

## 2019-08-31 LAB — GLUCOSE, CAPILLARY
Glucose-Capillary: 153 mg/dL — ABNORMAL HIGH (ref 70–99)
Glucose-Capillary: 158 mg/dL — ABNORMAL HIGH (ref 70–99)
Glucose-Capillary: 178 mg/dL — ABNORMAL HIGH (ref 70–99)
Glucose-Capillary: 209 mg/dL — ABNORMAL HIGH (ref 70–99)
Glucose-Capillary: 216 mg/dL — ABNORMAL HIGH (ref 70–99)
Glucose-Capillary: 272 mg/dL — ABNORMAL HIGH (ref 70–99)

## 2019-08-31 LAB — RENAL FUNCTION PANEL
Albumin: 1.8 g/dL — ABNORMAL LOW (ref 3.5–5.0)
Anion gap: 9 (ref 5–15)
BUN: 74 mg/dL — ABNORMAL HIGH (ref 8–23)
CO2: 24 mmol/L (ref 22–32)
Calcium: 8.1 mg/dL — ABNORMAL LOW (ref 8.9–10.3)
Chloride: 116 mmol/L — ABNORMAL HIGH (ref 98–111)
Creatinine, Ser: 1.17 mg/dL (ref 0.61–1.24)
GFR calc Af Amer: >60 mL/min (ref >60)
GFR calc non Af Amer: >60 mL/min (ref >60)
Glucose, Bld: 192 mg/dL — ABNORMAL HIGH (ref 70–99)
Phosphorus: 4.8 mg/dL — ABNORMAL HIGH (ref 2.5–4.6)
Potassium: 4.1 mmol/L (ref 3.5–5.1)
Sodium: 149 mmol/L — ABNORMAL HIGH (ref 135–145)

## 2019-08-31 LAB — COMPREHENSIVE METABOLIC PANEL
ALT: 108 U/L — ABNORMAL HIGH (ref 0–44)
AST: 94 U/L — ABNORMAL HIGH (ref 15–41)
Albumin: 1.9 g/dL — ABNORMAL LOW (ref 3.5–5.0)
Alkaline Phosphatase: 338 U/L — ABNORMAL HIGH (ref 38–126)
Anion gap: 10 (ref 5–15)
BUN: 71 mg/dL — ABNORMAL HIGH (ref 8–23)
CO2: 23 mmol/L (ref 22–32)
Calcium: 8.3 mg/dL — ABNORMAL LOW (ref 8.9–10.3)
Chloride: 116 mmol/L — ABNORMAL HIGH (ref 98–111)
Creatinine, Ser: 1.34 mg/dL — ABNORMAL HIGH (ref 0.61–1.24)
GFR calc Af Amer: >60 mL/min (ref >60)
GFR calc non Af Amer: 55 mL/min — ABNORMAL LOW (ref >60)
Glucose, Bld: 249 mg/dL — ABNORMAL HIGH (ref 70–99)
Potassium: 4.4 mmol/L (ref 3.5–5.1)
Sodium: 149 mmol/L — ABNORMAL HIGH (ref 135–145)
Total Bilirubin: 7.9 mg/dL — ABNORMAL HIGH (ref 0.3–1.2)
Total Protein: 5.8 g/dL — ABNORMAL LOW (ref 6.5–8.1)

## 2019-08-31 LAB — TYPE AND SCREEN
ABO/RH(D): A POS
Antibody Screen: NEGATIVE
Unit division: 0

## 2019-08-31 LAB — BPAM RBC
Blood Product Expiration Date: 202108172359
ISSUE DATE / TIME: 202107221556
Unit Type and Rh: 6200

## 2019-08-31 LAB — APTT: aPTT: 72 seconds — ABNORMAL HIGH (ref 24–36)

## 2019-08-31 LAB — PHOSPHORUS: Phosphorus: 4.4 mg/dL (ref 2.5–4.6)

## 2019-08-31 LAB — MAGNESIUM: Magnesium: 2.5 mg/dL — ABNORMAL HIGH (ref 1.7–2.4)

## 2019-08-31 MED ORDER — FREE WATER
100.0000 mL | Status: DC
  Administered 2019-08-31 – 2019-09-03 (×65): 100 mL

## 2019-08-31 MED ORDER — SODIUM CHLORIDE 3 % IN NEBU
4.0000 mL | INHALATION_SOLUTION | Freq: Two times a day (BID) | RESPIRATORY_TRACT | Status: DC
  Administered 2019-09-01 – 2019-09-02 (×4): 4 mL via RESPIRATORY_TRACT
  Filled 2019-08-31 (×7): qty 4

## 2019-08-31 MED ORDER — FUROSEMIDE 10 MG/ML IJ SOLN
40.0000 mg | Freq: Once | INTRAMUSCULAR | Status: AC
  Administered 2019-08-31: 40 mg via INTRAVENOUS
  Filled 2019-08-31: qty 4

## 2019-08-31 MED ORDER — THIAMINE HCL 100 MG/ML IJ SOLN
100.0000 mg | Freq: Every day | INTRAMUSCULAR | Status: DC
  Administered 2019-08-31 – 2019-09-24 (×25): 100 mg via INTRAVENOUS
  Filled 2019-08-31 (×25): qty 2

## 2019-08-31 MED ORDER — GERHARDT'S BUTT CREAM
TOPICAL_CREAM | CUTANEOUS | Status: DC | PRN
  Administered 2019-10-07: 1 via TOPICAL
  Filled 2019-08-31 (×2): qty 1

## 2019-08-31 MED ORDER — ACETAMINOPHEN 160 MG/5ML PO SOLN
650.0000 mg | Freq: Four times a day (QID) | ORAL | Status: DC | PRN
  Administered 2019-08-31 – 2019-10-02 (×7): 650 mg
  Filled 2019-08-31 (×7): qty 20.3

## 2019-08-31 NOTE — Progress Notes (Signed)
Referring Physician(s): Dr. Katrinka Blazing  Supervising Physician: Oley Balm  Patient Status:  [facility] - In-pt  Chief Complaint: Acute acalculous cholecystitis s/p cholecystostomy tube placed 08/24/19  Subjective:  Patient awake in bed, eyes open but less alert/responsive compared to when I saw him last week. Trached/vented. His wife was at the bedside.   HPI: Patient currently managed by Critical Care, Heart Failure, Infectious Disease and Cardiothoracic Surgery teams. He is currently in the ICU due to cardiogenic/hemorrhagic shock. 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 He is trached/vented and requiring multiple pressors, antibiotics, etc.  Allergies: Empagliflozin, Heparin, Other, Simvastatin, and Sitagliptin  Medications: Prior to Admission medications   Medication Sig Start Date End Date Taking? Authorizing Provider  citalopram (CELEXA) 40 MG tablet Take 40 mg by mouth daily.   Yes [provider]  dapagliflozin propanediol (FARXIGA) 10 MG TABS tablet Take 10 mg by mouth daily.   Yes [provider]  ergocalciferol (VITAMIN D2) 1.25 MG (50000 UT) capsule Take 50,000 Units by mouth 2 (two) times a week.   Yes [provider]  furosemide (LASIX) 20 MG tablet Take 40 mg by mouth daily. 07/16/19  Yes [provider]  glipiZIDE-metformin (METAGLIP) 2.5-500 MG tablet Take 2 tablets by mouth 2 (two) times daily with a meal.   Yes [provider]  ibuprofen (ADVIL) 200 MG tablet Take 400 mg by mouth 2 (two) times daily as needed for moderate pain.   Yes [provider]  levofloxacin (LEVAQUIN) 750 MG tablet Take 750 mg by mouth daily. For 14 days.   Yes [provider]  lisinopril (ZESTRIL) 20 MG tablet Take 20 mg by mouth daily.   Yes [provider]  oxyCODONE (ROXICODONE) 15 MG immediate release tablet Take 15 mg by mouth 3 (three) times daily.    Yes [provider]  pioglitazone (ACTOS) 45 MG tablet Take 45 mg by mouth daily.   Yes [provider]  promethazine (PHENERGAN) 25 MG tablet Take 25 mg by mouth 2 (two) times daily as needed for nausea or vomiting.   Yes [provider]     Vital Signs: BP (!) 118/49   Pulse 80   Temp 97.7 F (36.5 C) (Axillary)   Resp 20   Ht  (1.753 m)   Wt 154 lb 15.7 oz (70.3 kg)   SpO2 100%   BMI 22.89 kg/m   Physical Exam Constitutional:      Appearance: He is ill-appearing.     Interventions: He is intubated.     Comments: Eyes open, intermittently appears to track me during my assessment. Trached/vented.   Pulmonary:     Effort: Accessory muscle usage present. He is intubated.  Abdominal:     Comments: Cholecystostomy drain intact. Dark brown fluid in gravity bag. Tube flushed easily with 5 cc NS. Skin insertion site is clean and dry. No erythema or drainage observed.   Skin:    General: Skin is warm and dry.   Imaging: EEG  Result Date: 08/31/2019 Charlsie Quest, MD     08/31/2019 12:26 PM Patient Name: CHAYNE BAUMGART MRN: [id number] Epilepsy Attending: Charlsie Quest Referring Physician/Provider: Dr Albertha Ghee Date: 08/31/2019 Duration: 26.16 mins Patient history: 64yo M with ams. EEG to evaluate for seizure Level of alertness:  lethargic AEDs during EEG study: None Technical aspects: This EEG study was done with scalp electrodes positioned according to the  10-20 International system of electrode placement. Electrical activity was acquired at a sampling rate of 500Hz  and reviewed with a high frequency filter of 70Hz  and a low frequency filter of 1Hz . EEG data were recorded continuously and digitally stored. Description: No posterior dominant rhythm. EEG showed continuous generalized mixed frequencies with predominantly 5 to 6 Hz theta slowing with intermittent 13-15hz  frontocentral beta activity as well as 2-3hz  intermittent delta slowing. Hyperventilation  and photic stimulation were not performed.   ABNORMALITY. -Continuous slow, generalized IMPRESSION: This study is suggestive of severe diffuse encephalopathy, nonspecific etiology but likely related to sedation, toxic-metabolic etiology. No seizures or epileptiform discharges were seen throughout the recording.   CT HEAD WO CONTRAST  Result Date: 08/30/2019 CLINICAL DATA:  Encephalopathy, altered mental status EXAM: CT HEAD WITHOUT CONTRAST TECHNIQUE: Contiguous axial images were obtained from the base of the skull through the vertex without intravenous contrast. 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 Unchanged appearance from prior. No evidence of acute infarction, hemorrhage, hydrocephalus, extra-axial collection or mass lesion/mass effect. Symmetric prominence of the ventricles, cisterns and sulci compatible with parenchymal volume loss. Patchy areas of white matter hypoattenuation are most compatible with chronic microvascular angiopathy. Vascular: Atherosclerotic calcification of the carotid siphons and intradural vertebral arteries. No hyperdense vessel. Skull: Image quality degraded by motion artifact. No significant swelling or calvarial fracture. No worrisome osseous lesions. Sinuses/Orbits: Paranasal sinuses predominantly clear with a nasoenteric tube in place. Bilateral mastoid and middle ear effusions are noted. Included orbital structures are unremarkable. Other: Edentulous. IMPRESSION: 1. Image quality degraded by motion artifact. 2. No definite acute intracranial abnormality. 3. 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 4. Chronic microvascular angiopathy and parenchymal volume loss. 5. Bilateral mastoid and middle ear effusions. Similar finding on prior. Electronically Signed   By: Charlsie Quest M.D.   On: 08/30/2019 15:36   CT CHEST WO  CONTRAST  Result Date: 08/30/2019 CLINICAL DATA:  Chest radiograph 08/30/2019, CT abdomen pelvis 08/28/2019 EXAM: CT CHEST WITHOUT CONTRAST TECHNIQUE: Multidetector CT imaging of the chest was performed following the standard protocol without IV contrast. COMPARISON:  Chest radiograph 08/30/2019, CT abdomen and pelvis 08/28/2019 FINDINGS: Cardiovascular: Cardiomegaly. Small volume pericardial fluid. Hypoattenuation of the cardiac blood pool relative to the myocardium compatible with anemia. Evidence of prior sternotomy and CABG as well as postsurgical changes from recent LVAD removal. Atherosclerotic plaque within the normal caliber aorta. Plaque noted throughout the proximal great vessels as well. Mild central pulmonary artery enlargement, could reflect some pulmonary artery hypertension. Left IJ approach central venous catheter tip terminates in the lower SVC. Mediastinum/Nodes: Postsurgical changes from recent sternotomy with a small retrosternal fluid collection, nonspecific in the setting of recent operative intervention, measuring 3.7 x 3.1 cm in size (3/91). No mediastinal gas. Additional stranding in the anterior mediastinum likely related to operative intervention as well. Evidence of prior CABG. Tracheostomy tube is in place, tip terminating in the mid trachea 5 cm from the carina. Small amount of fluid above the tracheostomy balloon. A transesophageal tube is noted with the tip terminating beyond the gastric antrum, incompletely visualized. Scattered low-attenuation subcentimeter nodes throughout the mediastinum are likely reactive. Hilar adenopathy difficult to assess in the absence of contrast media. No worrisome axillary lymph nodes. Thyroid gland and thoracic inlet are free of acute abnormality. Lungs/Pleura: Bilateral pleural effusions despite the presence of bilateral apically directed chest tubes which remain  in position. Portions of these collections appear somewhat loculated with extension into  the fissures. Associated passive atelectasis is noted with additional areas of mixed heterogeneous and ground-glass opacity within the lungs as well as more heterogeneous attenuation of the atelectatic lung itself which could suggest some underlying infection. Findings are on a background diffuse airways thickening and scattered secretions as well as moderate to severe paraseptal predominant emphysematous changes throughout the lungs. No visible residual pneumothorax. Upper Abdomen: Percutaneous cholecystostomy tube in place. Small volume ascites in the upper abdomen. Stable mild bilateral perinephric stranding. No acute abnormalities present in the visualized portions of the upper abdomen. Lobular thickening of the adrenal glands similar to prior. Musculoskeletal: Multilevel degenerative changes are present in the imaged portions of the spine. Recent sternotomy. Diffuse body wall edema. Postsurgical changes in the soft tissues of the right axillary region may reflect recent cutdown or access attempt. IMPRESSION: 1. Postsurgical changes sternotomy a likely related to recent LVAD removal. Retrosternal fluid collection, nonspecific given the operative history. Infection cannot be fully excluded. 2. Bilateral pleural effusions despite the presence of bilateral apically directed chest tubes which remain in position. Portions of these collections appear somewhat loculated with extension into the fissures concerning for developing empyema. With heterogeneity within the areas of dependent and passive atelectasis and additional areas of mixed consolidative and ground-glass airspace disease suggesting superimposed infection, inflammation, or given airways thickening and secretions, sequela of aspiration. 3. Tracheostomy tube is in place, tip terminating in the mid trachea 5 cm from the carina. Small amount of fluid above the tracheostomy balloon. 4. Small volume ascites in the upper abdomen. 5. Percutaneous cholecystostomy  tube in place. 6. Diffuse body wall edema. 7. Cardiomegaly, evidence of prior CABG. Extensive native coronary artery calcification. 8. Likely anemia. 9. Aortic Atherosclerosis (ICD10-I70.0). Electronically Signed   By: Kreg Shropshire M.D.   On: 08/30/2019 15:48   CT ABDOMEN W CONTRAST  Result Date: 08/28/2019 CLINICAL DATA:  Fungemia, respiratory failure, acute cholecystitis with percutaneous cholecystostomy EXAM: CT ABDOMEN WITH CONTRAST TECHNIQUE: Multidetector CT imaging of the abdomen was performed using the standard protocol following bolus administration of intravenous contrast. CONTRAST:  OMNIPAQUE IOHEXOL 300 MG/ML  SOLN COMPARISON:  08/24/2019, 08/23/2019 FINDINGS: Lower chest: Right chest tube is unchanged. Continued dependent lower lobe atelectasis with small bilateral pleural effusions, left greater than right. Scattered areas of ground-glass airspace disease are seen within the right middle and right lower lobe, and could reflect hypoventilatory changes versus developing infection. Hepatobiliary: Stable position of the percutaneous cholecystostomy tube, with complete decompression of the gallbladder. The liver is unremarkable. Pancreas: Unremarkable. No pancreatic ductal dilatation or surrounding inflammatory changes. Stable coarse calcifications within the uncinate process. Spleen: Normal in size without focal abnormality. Adrenals/Urinary Tract: Stable left adrenal nodule. The right adrenal is stable. Chronic scarring of the lower pole left kidney with compensatory hypertrophy of the right kidney. No urinary tract calculi or obstructive uropathy. Stomach/Bowel: No bowel obstruction or ileus. No wall thickening or inflammatory change. Enteric catheter is seen, tip extending into the proximal jejunum. Vascular/Lymphatic: There is extensive atherosclerosis throughout the abdominal aorta, stable. No pathologic adenopathy. Other: There is trace residual free fluid within the right upper quadrant.  No free intraperitoneal gas. Musculoskeletal: No acute or destructive bony lesions. Reconstructed images demonstrate no additional findings. IMPRESSION: 1. Stable position of the percutaneous cholecystostomy tube, with complete decompression of the gallbladder. 2. Trace residual free fluid within the right upper quadrant, decreased since prior. 3. Trace bilateral pleural effusions, left  greater than right. 4. Scattered areas of ground-glass airspace disease within the right middle and right lower lobe, which could reflect hypoventilatory changes versus developing infection. 5. Aortic Atherosclerosis (ICD10-I70.0). Electronically Signed   By: Sharlet Salina M.D.   On: 08/28/2019 22:05   DG Chest Port 1 View  Result Date: 08/31/2019 CLINICAL DATA:  Tracheostomy.  Chest tubes. EXAM: PORTABLE CHEST 1 VIEW COMPARISON:  CT 08/30/2019. FINDINGS: Tracheostomy tube, feeding tube, left IJ line, bilateral chest tubes in stable position. Prior CABG. Stable cardiomegaly. Diffuse bilateral pulmonary infiltrates/edema slightly progressed from prior exam. Small bilateral pleural effusions. No pneumothorax. IMPRESSION: 1. Lines and tubes including bilateral chest tubes in stable position. No pneumothorax. 2. Prior CABG. Cardiomegaly again noted. Diffuse bilateral pulmonary infiltrates/edema slightly progressed from prior exam. Small bilateral pleural effusions. Electronically Signed   By: Maisie Fus  Register   On: 08/31/2019 08:19   DG CHEST PORT 1 VIEW  Result Date: 08/30/2019 CLINICAL DATA:  Chest tube. EXAM: PORTABLE CHEST 1 VIEW COMPARISON:  08/27/2019. FINDINGS: Interim removal of left PICC line and placement of left IJ line. Left IJ line tip at cavoatrial junction. Tracheostomy tube and feeding tube in stable position. Bilateral chest tubes in stable position. Prior CABG. Stable cardiomegaly. Diffuse bilateral interstitial prominence consistent with interstitial edema and/or pneumonitis again noted. Small right pleural  effusion cannot be excluded. Left costophrenic angle not completely imaged. No pneumothorax. Surgical staples noted over the right chest. IMPRESSION: 1. Interim removal of left PICC line and placement of left IJ line. Left IJ line tip at cavoatrial junction. Tracheostomy tube and feeding tube in stable position. Bilateral chest tubes stable position. No pneumothorax. 2. Prior CABG. Stable cardiomegaly. Diffuse bilateral interstitial prominence consistent interstitial edema and/or pneumonitis again noted. Similar findings noted on prior exam. Small right pleural effusion cannot be excluded. Left costophrenic angle incompletely imaged. Electronically Signed   By: Maisie Fus  Register   On: 08/30/2019 07:06   DG Chest Port 1 View  Result Date: 08/29/2019 CLINICAL DATA:  Central line placement EXAM: PORTABLE CHEST 1 VIEW COMPARISON:  Earlier same day FINDINGS: Left internal jugular central line tip in the SVC 2 cm above the right atrium. Left arm PICC tip at the SVC RA junction or just in the right atrium. No pneumothorax. Left chest tube remains in place. Soft feeding tube enters the abdomen. Tracheostomy in place. Atelectasis in the lower lungs persists, left worse than right. IMPRESSION: Left internal jugular central line tip in the SVC 2 cm above the right atrium. No pneumothorax. No other change. Electronically Signed   By: Paulina Fusi M.D.   On: 08/29/2019 17:16   DG CHEST PORT 1 VIEW  Result Date: 08/29/2019 CLINICAL DATA:  Hypoxia EXAM: PORTABLE CHEST 1 VIEW COMPARISON:  08/27/2019 FINDINGS: Tracheostomy tube and feeding catheter are again noted and stable. Postsurgical changes are again seen. Left-sided PICC line is noted and stable. Bilateral thoracostomy catheters are seen without evidence of pneumothorax. Stable left retrocardiac opacity is noted. The previously seen interstitial prominence has improved in the interval from the prior exam. IMPRESSION: Persistent left retrocardiac opacity. Tubes and  lines as described. Electronically Signed   By: Alcide Clever M.D.   On: 08/29/2019 15:31    Labs:  CBC: Recent Labs    08/29/19 0222 08/29/19 1440 08/30/19 0453 08/30/19 0939 08/30/19 2105 08/31/19 0230  WBC 29.7*  --  25.7* 27.0*  --  27.0*  HGB 8.0*   < > 7.0* 6.9* 8.5* 7.8*  HCT 27.7*   < >  25.1* 25.3* 29.6* 27.7*  PLT 409*  --  403* 399  --  364   < > = values in this interval not displayed.    COAGS: Recent Labs    08/06/19 0340 08/06/19 0340 08/07/19 0347 08/07/19 1442 08/08/19 0302 08/20/19 1202 08/21/19 0406 08/28/19 0400 08/29/19 0222 08/30/19 0453 08/31/19 0230  INR 1.2  --  1.3* 1.2  --  1.6*  --   --   --   --   --   APTT 94*   < > 44*  --    < >  --    < > 76* 78* 73* 72*   < > = values in this interval not displayed.    BMP: Recent Labs    08/29/19 1630 08/30/19 0513 08/30/19 1549 08/31/19 0230  NA 152* 147* 148* 149*  K 4.7 4.8 4.5 4.4  CL 120* 118* 117* 116*  CO2 24 22 22 23   GLUCOSE 218* 295* 176* 249*  BUN 55* 58* 64* 71*  CALCIUM 8.5* 8.4* 8.4* 8.3*  CREATININE 1.16 1.38* 1.32* 1.34*  GFRNONAA >60 53* 56* 55*  GFRAA >60 >60 >60 >60    LIVER FUNCTION TESTS: Recent Labs    08/28/19 0234 08/28/19 1700 08/29/19 0222 08/29/19 0222 08/29/19 1630 08/30/19 0513 08/30/19 1549 08/31/19 0230  BILITOT 8.7*  --  9.0*  --   --  7.3*  --  7.9*  AST 96*  --  103*  --   --  91*  --  94*  ALT 107*  --  113*  --   --  106*  --  108*  ALKPHOS 395*  --  449*  --   --  350*  --  338*  PROT 6.8  --  5.9*  --   --  5.7*  --  5.8*  ALBUMIN 1.9*   < > 2.1*   < > 1.9* 1.9* 1.9* 1.9*   < > = values in this interval not displayed.    Assessment and Plan:  Acute cholecystitis, s/p percutaneous cholecystectomy by Dr. 09/02/19 on 08/24/2019.  CT scan 08/28/19 showed:  Stable position of the percutaneous cholecystostomy tube, with complete decompression of the gallbladder.  WBC has increased to 27. 30 cc output documented on flow sheet,  approximately 50 cc in bag. Patient is afebrile.  Recommend team continue with flushing TID, output recording every shift and dressing changes as needed. Would consider additional imaging when output is less than 10 ml for 24 hours not including flush material.   Continue current treatment plans as per Critical Care. Interventional Radiology will continue to follow.    Electronically Signed: 08/30/19, AGACNP-BC [phone] 08/31/2019, 3:24 PM   I spent a total of 15 Minutes at the the patient's bedside AND on the patient's hospital floor or unit, greater than 50% of which was counseling/coordinating care for percutaneous cholecystostomy drain.

## 2019-08-31 NOTE — Progress Notes (Signed)
NAME:  Todd [name], MRN:  [id number], DOB:  1955/08/07, LOS: 21 ADMISSION DATE:  07/26/2019, CONSULTATION DATE:  08/03/19 REFERRING MD:  Roxan Hockey, CHIEF COMPLAINT:  ECMO   Brief History   64 yo male admitted with CHF exacerbation, found to have multi-vessel disease requiring CABG 6/24, post op VT Arrest 6/25 and cannulated for VA ECMO.    History of present illness   Presented with worsening dyspnea 6/17 c/w CHF exacerbation.  LHC  with severe triple vessel disease.  Underwent CABG 6/24.  Vtach arrest 6/25.  Chest opened bedside and cardiac massage initiated as well as multiple cardioversions, amiodarone, bicarb etc.  Brought to OR and cannulated for VA ECMO.  PCCM consulted to assist with management  Comorbidities include DM, heavy smoking, COPD  Past Medical History  Depression Ischemic cardiomyopathy HTN HLD  Significant Hospital Events   6/24 CABG 6/25 VA cannulation 6/28 decannulated and impella placed 6/29 bedside re-exploration; s/p decannulation. Placement of impella device originally at p8 but decreased to p4 2/2 suction events overnight up to p6. Echo completed and repositioned device. Remained on considerable support with 8 epi and 46 norepi vaso 0.05. continued chest tube output with large clots noted. Heparin thru device. Replacement products ongoing. 60% 8 peep 6/30 bedside mediastinal re-exploration and clean out of hematoma with tamponade and worsening hemodynamics. Pt had large volume transfusion. rebolused with amio 2/2 nsvt episode.  Weight up 48 pounds.  Started diuresis, Lasix drip started. 7/01 iatrogenic respiratory alkalosis, vent rate decreased. 7/02 No significant issues overnight remains on pressors and Impella not tolerating tube feeds with high gastric output awaiting core track placement 7/03 started trickle TF  7/05 Swab removed, CVL and PICC placed. 7/13 Trach and BAL 7/16 Awake this AM and tracking. No follow commands. Still having fevers. Successful  ultrasound and CT guided placement of a 10.2 French cholecystostomy tube. A small amount of aspirated bile was capped and sent to the laboratory for analysis 7/17 Off sedation gtt. doing pressure support ventilation.  Beginning to follow some commands occasionally intermittently.  Very jaundiced.  Growing Candida in the blood and BAL.  Still febrile but T-max is coming down.  WBC persistently elevated; currently at 19.4 okay.  Diflucan started. Currently on milrinone GTT, amiodarone gtt., vasopressin gtt., Levophed gtt. and bivalrudin gtt. Off epi gtt. Cards planning lasix x 1 today. 7/21 Ongoing fevers, WBC increased 7/22: Persistent fevers, pan cultured with CT Chest/Head. Suspected source tracheobronchitis.   Consults:  Advanced heart failure, PCCM, TCTS  Procedures:  LUE PICC 7/5 >>7/21 R Brachial ALine 7/9 >> Chest Tubes >> L IJ CVC 7/21 >>  Significant Diagnostic Tests:  6/22 spirometry with restrictive physiology, preserved FEV1/FVC 6/18 echo: LVEF 70-96%, grade I diastolic dysfunction 2/83 US Abdomen cholelithiasis without cholecystitis. Mild wall thinking in setting of liver disease and ascites.  7/10: LE [place] negative for DVT  7/20 CT ABD w contrast >> stable position of the percutaneous cholecystostomy tube with complete decompression of the gallbladder, trace residual free fluid within the RUQ, decreased since prior, trace bilateral pleural effusions L>R, scattered areas of ground glass airspace disease within the RML, RLL 7/22: CT Chest w/o Contrast: Retrosternal fluid collection, non specific.  Bilateral pleural effusions, portions loculated.   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 Chronic microvascular angiopathy. Stable bilateral mastoid and middle ear effusions.   Micro Data:  COVID and HIV 6/17 >> Neg 7/3 respiratory >> few Candida tropicalis 7/5 blood >>  negative  7/5 respiratory >> few Candida tropicalis 7/7 urine >>  negative 7/7 blood >> negative  7/9 wound >> negative 7/12 BAL >> candida tropicalis, candida albicans 7/14 BCID >> candida parapsilosis   7/14 BCx2 >> candida parapsilosis  7/17 BCx2 >> NGTD 7/21 BCx2 >> NGTD 7/22 Respiratory Cx >>  Antimicrobials:  Vanc 6/24>>7/13 Cefuroxime 6/24 >> 6/25 merrem 6/26 >> 7/14 Anidulafungin 7/8 >> 7/14 Zosyn 7/14 >>7/21 Diflucan 7/17 >> Vanco 7/22 Mero 7/22  Interim history/subjective:   + 1.5 L with diuresis, net negative 12 L this admission Abx initiated, ongoing fevers  Required 1 unit PRBC transfusion.  Remains on: Levophed at 8 mcg/min with MAP 80's this am, improving doses from yesterday.  Alert, weakly following commands, improving neurological exam   Objective   Blood pressure (!) 126/51, pulse 81, temperature 100 F (37.8 C), resp. rate 19, height _0  (1.753 m), weight 70.3 kg, SpO2 99 %. CVP:  [8 mmHg] 8 mmHg  Vent Mode: PRVC FiO2 (%):  [40 %] 40 % Set Rate:  [20 bmp] 20 bmp Vt Set:  [420 mL] 420 mL PEEP:  [5 cmH20] 5 cmH20 Plateau Pressure:  [17 cmH20-21 cmH20] 19 cmH20   Intake/Output Summary (Last 24 hours) at 08/31/2019 0725 Last data filed at 08/31/2019 0000 Gross per 24 hour  Intake 2647.66 ml  Output 1080 ml  Net 1567.66 ml   Filed Weights   08/29/19 0438 08/30/19 0455 08/31/19 0500  Weight: 66 kg 67.2 kg 70.3 kg   Exam General: Adult male with trach and mechanical ventilator  HEENT: MM dry, old blood noted, trach midline #8 Neuro Eyes open spontaneously, following commands but globally weak in all extremities,+ tracking CV: s1s2 RRR, SR on monitor, no m/r/g, midline sternal wound with staples, chest tubes x2 PULM: non-labored on vent, lungs bilaterally coarse, purulent secretions.  GI: soft, bsx4 active, cortrak in place  Extremities: warm/dry, no edema  Skin: no rashes or lesions on exposed skin.  Sacral decubitus approximately 3x3 inch site, appears healthy   Resolved Hospital Problem list      Assessment & Plan:   Critically ill due to cardiogenic/hemorrhagic shock status post CABG complicated by VT arrest,  S/p VA ECMO (decanulated 6/28). S/p impella placement (6/28> 7/9) -ASA -wean vasopressors for MAP >65. Currently on Levophed and weaning this am.  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  -Continues to have tenacious secretions. Add CPT, Start hypertonic nebs.  -Continue PRVC, did not tolerate PSV yesterday  Deconditioning of Critical Illness  --will need PT/OT when appropriate  Hyperbilirubinemia due to acalculous cholecystitis Possible cirrhosis per 7/8 [place]  Status post  percutaneous GB drain  --IR assistancing with GB drain, continue until bilirubin normalizes --LFTs and bili are trending in appropriate direction  T2DM --Improving glycemic control.  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 --continue diflucan, will need 2 weeks antifungal coverage --Ophthamology has evaluated this admission --Line exchange 7/21 with Repeat Blood Cx 7/21 NGTD  Acute blood loss anemia and critical illness anemia Heparin-induced thrombocytopenia --Bivalirudin  --Hgb stable, no evidence of bleeding  Acute kidney injury --Continue supportive care, monitor UOP closely  Hypernatremia / Hyperchloremia  --Increase free water flushes to 2.4 L per day (  114m/hr)  Acute encephalopathy  --CTH without acute intracranial pathology --Infectious process likely contributing to encephalopathy  --Minimize sedating medications --Delirium prevention measures --EEG today  --Add thiamine  Sacral Decubitus  --Wound care, appears healthy  Small left adrenal mass  seen on CT scan of the abdomen August 24, 2019  Fever --Broaden abx to Vanc/Mero 7/22 --Pan cultured with CT Chest/Abd/Pelvis. Suspected source tracheobronchitis/PNA. Continue broad spectrum abx until cultures resulted.   Daily Goals Checklist  Pain/Anxiety/Delirium protocol (if indicated): fentanyl as needed only VAP protocol (if indicated): bundle in place.  DVT prophylaxis: Bivalirudin per pharmacy  Nutrition Status: High nutritional risk. Continue tube feeds.  GI prophylaxis: PPI Urinary catheter: n/a Central lines: L IJ CVC, Art Glucose control: See above Mobility/therapy needs: PT for ROM Code Status: Full  Family Communication: per TCTS Disposition: ICU   LABS    PULMONARY Recent Labs  Lab 08/28/19 0234 08/29/19 0222 08/29/19 1440 08/30/19 0453 08/31/19 0230  PHART  --   --  7.416  --   --   PCO2ART  --   --  40.4  --   --   PO2ART  --   --  132*  --   --   HCO3  --   --  25.7  --   --   TCO2  --   --  27  --   --   O2SAT 57.8 66.1 99.0 99.3 68.1    CBC Recent Labs  Lab 08/30/19 0453 08/30/19 0453 08/30/19 0939 08/30/19 2105 08/31/19 0230  HGB 7.0*   < > 6.9* 8.5* 7.8*  HCT 25.1*   < > 25.3* 29.6* 27.7*  WBC 25.7*  --  27.0*  --  27.0*  PLT 403*  --  399  --  364   < > = values in this interval not displayed.    COAGULATION No results for input(s): INR in the last 168 hours.  CARDIAC  No results for input(s): TROPONINI in the last 168 hours. No results for input(s): PROBNP in the last 168 hours.   CHEMISTRY Recent Labs  Lab 08/26/19 0032 08/26/19 0032 08/26/19 0228 08/26/19 1727 08/27/19 0218 08/27/19 0218 08/27/19 1600 08/28/19 0234 08/28/19 1700 08/28/19 1700 08/29/19 0222 08/29/19 0222 08/29/19 1440 08/29/19 1440 08/29/19 1630 08/29/19 1630 08/30/19 0513 08/30/19 0513 08/30/19 1549 08/31/19 0230  NA 147*   < > 148*   < > 143   < > 144   < > 148*   < > 150*   < > 153*  --  152*  --  147*  --  148* 149*  K 4.6   < > 3.8   <  > 4.2   < > 4.0   < > 3.9   < > 4.4   < > 4.8   < > 4.7   < > 4.8   < > 4.5 4.4  CL 109   < > 109   < > 109   < > 112*   < > 115*   < > 116*  --   --   --  120*  --  118*  --  117* 116*  CO2 28   < > 29   < > 25   < > 24   < > 25   < > 25  --   --   --  24  --  22  --  22 23  GLUCOSE 234*   < > 170*   < >  114*   < > 220*   < > 193*   < > 199*  --   --   --  218*  --  295*  --  176* 249*  BUN 65*   < > 65*   < > 46*   < > 43*   < > 43*   < > 48*  --   --   --  55*  --  58*  --  64* 71*  CREATININE 1.42*   < > 1.45*   < > 1.09   < > 1.08   < > 1.13   < > 1.16  --   --   --  1.16  --  1.38*  --  1.32* 1.34*  CALCIUM 8.3*   < > 8.2*   < > 8.1*   < > 8.1*   < > 7.8*   < > 8.6*  --   --   --  8.5*  --  8.4*  --  8.4* 8.3*  MG 2.4  --  2.4  --  2.2  --   --   --   --   --  2.3  --   --   --   --   --   --   --   --  2.5*  PHOS  --   --   --    < >  --   --  3.5  --  3.8  --   --   --   --   --  3.6  --   --   --  4.4 4.4   < > = values in this interval not displayed.   Estimated Creatinine Clearance: 54.6 mL/min (A) (by C-G formula based on SCr of 1.34 mg/dL (H)).   LIVER Recent Labs  Lab 08/27/19 0218 08/27/19 1600 08/28/19 0234 08/28/19 1700 08/29/19 0222 08/29/19 1630 08/30/19 0513 08/30/19 1549 08/31/19 0230  AST 129*  --  96*  --  103*  --  91*  --  94*  ALT 139*  --  107*  --  113*  --  106*  --  108*  ALKPHOS 496*  --  395*  --  449*  --  350*  --  338*  BILITOT 10.2*  --  8.7*  --  9.0*  --  7.3*  --  7.9*  PROT 5.7*  --  6.8  --  5.9*  --  5.7*  --  5.8*  ALBUMIN 2.1*   < > 1.9*   < > 2.1* 1.9* 1.9* 1.9* 1.9*   < > = values in this interval not displayed.     INFECTIOUS No results for input(s): LATICACIDVEN, PROCALCITON in the last 168 hours.   ENDOCRINE CBG (last 3)  Recent Labs    08/30/19 1956 08/31/19 0035 08/31/19 0437  GLUCAP 178* 272* 158*    CRITICAL CARE   Critical Care Time: 40  minutes    Paulita Fujita, ACNP [facility] 08/31/2019, 7:25 AM   Please see [email or web address] for pager details.

## 2019-08-31 NOTE — Progress Notes (Signed)
EEG complete - results pending 

## 2019-08-31 NOTE — Progress Notes (Signed)
Patient ID: Todd [name], male   DOB: 10-07-1955, 64 y.o.   MRN: [id number] EVENING ROUNDS NOTE :     [address]       [place] [id number]             [phone]                 14 Days Post-Op Procedure(s) (LRB): REMOVAL OF IMPELLA LEFT VENTRICULAR ASSIST DEVICE (N/A) TRANSESOPHAGEAL ECHOCARDIOGRAM (TEE) (N/A)  Total Length of Stay:  LOS: 36 days  BP (!) 118/49   Pulse 80   Temp 97.7 F (36.5 C) (Axillary)   Resp 20   Ht 5\' 9"  (1.753 m)   Wt 70.3 kg   SpO2 100%   BMI 22.89 kg/m   .Intake/Output      07/22 0701 - 07/23 0700 07/23 0701 - 07/24 0700   I.V. (mL/kg) 591.4 (8.4) 86.8 (1.2)   Blood 374.8    NG/GT 1445 1235   IV Piggyback 900.1 275.3   Total Intake(mL/kg) 3311.3 (47.1) 1597.1 (22.7)   Urine (mL/kg/hr) 1300 (0.8)    Drains 30    Stool 300    Chest Tube 110    Total Output 1740    Net +1571.3 +1597.1        Urine Occurrence 1 x      . sodium chloride 250 mL (08/13/19 1412)  . sodium chloride Stopped (08/13/19 1631)  . bivalirudin (ANGIOMAX) infusion 0.5 mg/mL (Non-ACS indications) 0.055 mg/kg/hr (08/31/19 1200)  . feeding supplement (PIVOT 1.5 CAL) 1,000 mL (08/31/19 1145)  . fluconazole (DIFLUCAN) IV Stopped (08/31/19 0428)  . lactated ringers 20 mL/hr at 08/10/19 1705  . lactated ringers 10 mL/hr at 08/18/19 0054  . meropenem (MERREM) IV 1 g (08/31/19 1655)  . norepinephrine (LEVOPHED) Adult infusion 7 mcg/min (08/31/19 1200)  . vancomycin Stopped (08/31/19 1052)     Lab Results  Component Value Date   WBC 27.0 (H) 08/31/2019   HGB 7.8 (L) 08/31/2019   HCT 27.7 (L) 08/31/2019   PLT 364 08/31/2019   GLUCOSE 192 (H) 08/31/2019   CHOL 203 (H) 07/27/2019   TRIG 213 (H) 08/27/2019   HDL 34 (L) 07/27/2019   LDLCALC 148 (H) 07/27/2019   ALT 108 (H) 08/31/2019   AST 94 (H) 08/31/2019   NA 149 (H) 08/31/2019   K 4.1 08/31/2019   CL 116 (H) 08/31/2019   CREATININE 1.17 08/31/2019   BUN 74 (H) 08/31/2019   CO2 24 08/31/2019    TSH 2.243 07/31/2019   INR 1.6 (H) 08/20/2019   HGBA1C 9.0 (H) 07/26/2019   Persistent loose stools - waiting for cdiff testing No fever today    07/28/2019 MD  Beeper [phone] Office [phone] 08/31/2019 5:38 PM

## 2019-08-31 NOTE — Procedures (Signed)
Patient Name: Todd [name]  MRN: [id number]  Epilepsy Attending: Charlsie Quest  Referring Physician/Provider: Dr Albertha Ghee Date: 08/31/2019 Duration: 26.16 mins  Patient history: 64yo M with ams. EEG to evaluate for seizure  Level of alertness:  lethargic  AEDs during EEG study: None  Technical aspects: This EEG study was done with scalp electrodes positioned according to the 10-20 International system of electrode placement. Electrical activity was acquired at a sampling rate of 500Hz  and reviewed with a high frequency filter of 70Hz  and a low frequency filter of 1Hz . EEG data were recorded continuously and digitally stored.   Description: No posterior dominant rhythm. EEG showed continuous generalized mixed frequencies with predominantly 5 to 6 Hz theta slowing with intermittent 13-15hz  frontocentral beta activity as well as 2-3hz  intermittent delta slowing. Hyperventilation and photic stimulation were not performed.     ABNORMALITY. -Continuous slow, generalized  IMPRESSION: This study is suggestive of severe diffuse encephalopathy, nonspecific etiology but likely related to sedation, toxic-metabolic etiology. No seizures or epileptiform discharges were seen throughout the recording.  Todd [name] 

## 2019-08-31 NOTE — Progress Notes (Addendum)
Patient ID: Todd [name], male   DOB: Jun 18, 1955, 64 y.o.   MRN: [id number]     Advanced Heart Failure Rounding Note  PCP-Cardiologist: No primary care provider on file.   Subjective:    08/02/19 CABG 08/03/19 VF arrest. Chest open at bedside 08/03/19 Back to OR for ECMO cannulation 08/03/19 Washout out for tamponade 08/04/19 Repeat bedside washout for tamponade 08/06/19 To OR for decannulation and Impella 5.5 08/07/19 Underwent emergent bedside chest exploration at bedside for tamponade  08/10/19 Chest closed 08/16/19 HIT positive, bivalirudin begun 08/17/19 Impella removed 08/20/19 Tracheostomy 08/23/19 HIDA scan showed patent common bile duct (unable to visualize GB, unable to rule out acalculous cholecystitis).  08/24/19 cholecystostomy tube  Remains on Milrinone 0.125, NE 7. Off VP. On Midodrine 15 mg tid.  Co-ox 68%.   Wt continues to trend up, 145>>148>>154 lb. No CVP set up.   NSR on po amiodarone.    Meropenem and [name] stopped 7/14, Zosyn stopped 7/21.  Now with Candida from BAL and blood culture, started on Diflucan.  WBCs 19>>24>>29>>24>>30>>26>>27.   Abdominal CT 7/20 with no definite source.   Febrile to 102 overnight. Vanc + meropenum restarted yesterday. Chest CT (7/22) with loculated effusions, ?developing empyema.   Received 1 unit of blood yesterday for hgb of 6.9. Hgb  7.8 today.   Now getting tube feeds via post-pyloric tube.   Jaundiced with consistently elevated bilirubin (direct), tbili 7.9 today.  Abdominal US with gallbladder sludge, no definite cholecystitis.  HIDA scan with patent CBD but no gallbladder activity. Abdominal CT with gallbladder sludge. Now s/p cholecystostomy tube.   Objective:   Weight Range: 70.3 kg Body mass index is 22.89 kg/m.   Vital Signs:   Temp:  [100 F (37.8 C)-102 F (38.9 C)] 100 F (37.8 C) (07/23 0600) Pulse Rate:  [66-93] 74 (07/23 0743) Resp:  [18-30] 21 (07/23 0743) BP: (119-140)/(48-75) 126/51 (07/23 0600) SpO2:  [95  %-100 %] 100 % (07/23 0743) Arterial Line BP: (93-143)/(38-72) 120/57 (07/23 0100) FiO2 (%):  [40 %] 40 % (07/23 0743) Weight:  [70.3 kg] 70.3 kg (07/23 0500) Last BM Date: 08/30/19  Weight change: Filed Weights   08/29/19 0438 08/30/19 0455 08/31/19 0500  Weight: 66 kg 67.2 kg 70.3 kg    Intake/Output:   Intake/Output Summary (Last 24 hours) at 08/31/2019 0748 Last data filed at 08/31/2019 0000 Gross per 24 hour  Intake 2647.66 ml  Output 1080 ml  Net 1567.66 ml      Physical Exam    General: fatigue/ weak appearing, Awake on vent via trach Neck: Tracheostomy, no thyromegaly or thyroid nodule.  Lungs: course/ rhonchorous bilaterally  CV: Nondisplaced PMI.  Heart regular S1/S2, no S3/S4, no murmur.  1+ edema bilateral thighs    Abdomen: Soft, nontender, no hepatosplenomegaly, no distention.  Skin: Jaundiced.  Neurologic: Intermittently follows commands per nurse Extremities: No clubbing or cyanosis. Distal extremities cool to touch  HEENT: Normal. + cor track    Telemetry   NSR 70s.  Personally reviewed   Labs    CBC Recent Labs    08/30/19 0939 08/30/19 0939 08/30/19 2105 08/31/19 0230  WBC 27.0*  --   --  27.0*  NEUTROABS 23.8*  --   --  23.4*  HGB 6.9*   < > 8.5* 7.8*  HCT 25.3*   < > 29.6* 27.7*  MCV 112.4*  --   --  106.9*  PLT 399  --   --  364   < > =  values in this interval not displayed.   Basic Metabolic Panel Recent Labs    08/29/19 0222 08/29/19 1440 08/30/19 1549 08/31/19 0230  NA 150*   < > 148* 149*  K 4.4   < > 4.5 4.4  CL 116*   < > 117* 116*  CO2 25   < > 22 23  GLUCOSE 199*   < > 176* 249*  BUN 48*   < > 64* 71*  CREATININE 1.16   < > 1.32* 1.34*  CALCIUM 8.6*   < > 8.4* 8.3*  MG 2.3  --   --  2.5*  PHOS  --    < > 4.4 4.4   < > = values in this interval not displayed.   Liver Function Tests Recent Labs    08/30/19 0513 08/30/19 0513 08/30/19 1549 08/31/19 0230  AST 91*  --   --  94*  ALT 106*  --   --  108*    ALKPHOS 350*  --   --  338*  BILITOT 7.3*  --   --  7.9*  PROT 5.7*  --   --  5.8*  ALBUMIN 1.9*   < > 1.9* 1.9*   < > = values in this interval not displayed.   No results for input(s): LIPASE, AMYLASE in the last 72 hours. Cardiac Enzymes No results for input(s): CKTOTAL, CKMB, CKMBINDEX, TROPONINI in the last 72 hours.  BNP: BNP (last 3 results) Recent Labs    07/26/19 1236  BNP 2,568.2*    ProBNP (last 3 results) No results for input(s): PROBNP in the last 8760 hours.   D-Dimer No results for input(s): DDIMER in the last 72 hours. Hemoglobin A1C No results for input(s): HGBA1C in the last 72 hours. Fasting Lipid Panel No results for input(s): CHOL, HDL, LDLCALC, TRIG, CHOLHDL, LDLDIRECT in the last 72 hours. Thyroid Function Tests No results for input(s): TSH, T4TOTAL, T3FREE, THYROIDAB in the last 72 hours.  Invalid input(s): FREET3  Other results:   Imaging    CT HEAD WO CONTRAST  Result Date: 08/30/2019 CLINICAL DATA:  Encephalopathy, altered mental status EXAM: CT HEAD WITHOUT CONTRAST TECHNIQUE: Contiguous axial images were obtained from the base of the skull through the vertex without intravenous contrast. 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 Unchanged appearance from prior. No evidence of acute infarction, hemorrhage, hydrocephalus, extra-axial collection or mass lesion/mass effect. Symmetric prominence of the ventricles, cisterns and sulci compatible with parenchymal volume loss. Patchy areas of white matter hypoattenuation are most compatible with chronic microvascular angiopathy. Vascular: Atherosclerotic calcification of the carotid siphons and intradural vertebral arteries. No hyperdense vessel. Skull: Image quality degraded by motion artifact. No significant swelling or calvarial fracture. No worrisome osseous lesions. Sinuses/Orbits: Paranasal sinuses  predominantly clear with a nasoenteric tube in place. Bilateral mastoid and middle ear effusions are noted. Included orbital structures are unremarkable. Other: Edentulous. IMPRESSION: 1. Image quality degraded by motion artifact. 2. No definite acute intracranial abnormality. 3. 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 4. Chronic microvascular angiopathy and parenchymal volume loss. 5. Bilateral mastoid and middle ear effusions. Similar finding on prior. Electronically Signed   By: Lovena Le M.D.   On: 08/30/2019 15:36   CT CHEST WO CONTRAST  Result Date: 08/30/2019 CLINICAL DATA:  Chest radiograph 08/30/2019, CT abdomen pelvis 08/28/2019 EXAM: CT CHEST WITHOUT CONTRAST TECHNIQUE: Multidetector CT imaging of the  chest was performed following the standard protocol without IV contrast. COMPARISON:  Chest radiograph 08/30/2019, CT abdomen and pelvis 08/28/2019 FINDINGS: Cardiovascular: Cardiomegaly. Small volume pericardial fluid. Hypoattenuation of the cardiac blood pool relative to the myocardium compatible with anemia. Evidence of prior sternotomy and CABG as well as postsurgical changes from recent LVAD removal. Atherosclerotic plaque within the normal caliber aorta. Plaque noted throughout the proximal great vessels as well. Mild central pulmonary artery enlargement, could reflect some pulmonary artery hypertension. Left IJ approach central venous catheter tip terminates in the lower SVC. Mediastinum/Nodes: Postsurgical changes from recent sternotomy with a small retrosternal fluid collection, nonspecific in the setting of recent operative intervention, measuring 3.7 x 3.1 cm in size (3/91). No mediastinal gas. Additional stranding in the anterior mediastinum likely related to operative intervention as well. Evidence of prior CABG. Tracheostomy tube is in place, tip terminating in the mid trachea 5 cm from the carina. Small amount of fluid above the tracheostomy  balloon. A transesophageal tube is noted with the tip terminating beyond the gastric antrum, incompletely visualized. Scattered low-attenuation subcentimeter nodes throughout the mediastinum are likely reactive. Hilar adenopathy difficult to assess in the absence of contrast media. No worrisome axillary lymph nodes. Thyroid gland and thoracic inlet are free of acute abnormality. Lungs/Pleura: Bilateral pleural effusions despite the presence of bilateral apically directed chest tubes which remain in position. Portions of these collections appear somewhat loculated with extension into the fissures. Associated passive atelectasis is noted with additional areas of mixed heterogeneous and ground-glass opacity within the lungs as well as more heterogeneous attenuation of the atelectatic lung itself which could suggest some underlying infection. Findings are on a background diffuse airways thickening and scattered secretions as well as moderate to severe paraseptal predominant emphysematous changes throughout the lungs. No visible residual pneumothorax. Upper Abdomen: Percutaneous cholecystostomy tube in place. Small volume ascites in the upper abdomen. Stable mild bilateral perinephric stranding. No acute abnormalities present in the visualized portions of the upper abdomen. Lobular thickening of the adrenal glands similar to prior. Musculoskeletal: Multilevel degenerative changes are present in the imaged portions of the spine. Recent sternotomy. Diffuse body wall edema. Postsurgical changes in the soft tissues of the right axillary region may reflect recent cutdown or access attempt. IMPRESSION: 1. Postsurgical changes sternotomy a likely related to recent LVAD removal. Retrosternal fluid collection, nonspecific given the operative history. Infection cannot be fully excluded. 2. Bilateral pleural effusions despite the presence of bilateral apically directed chest tubes which remain in position. Portions of these  collections appear somewhat loculated with extension into the fissures concerning for developing empyema. With heterogeneity within the areas of dependent and passive atelectasis and additional areas of mixed consolidative and ground-glass airspace disease suggesting superimposed infection, inflammation, or given airways thickening and secretions, sequela of aspiration. 3. Tracheostomy tube is in place, tip terminating in the mid trachea 5 cm from the carina. Small amount of fluid above the tracheostomy balloon. 4. Small volume ascites in the upper abdomen. 5. Percutaneous cholecystostomy tube in place. 6. Diffuse body wall edema. 7. Cardiomegaly, evidence of prior CABG. Extensive native coronary artery calcification. 8. Likely anemia. 9. Aortic Atherosclerosis (ICD10-I70.0). Electronically Signed   By: Lovena Le M.D.   On: 08/30/2019 15:48     Medications:     Scheduled Medications: . amiodarone  200 mg Per Tube BID  . aspirin  81 mg Per Tube Daily  . chlorhexidine gluconate (MEDLINE KIT)  15 mL Mouth Rinse BID  . Chlorhexidine Gluconate Cloth  6 each Topical Daily  . cholestyramine  4 g Oral BID  . collagenase   Topical Daily  . docusate  200 mg Per Tube Daily  . feeding supplement (PROSource TF)  45 mL Per Tube Daily  . free water  100 mL Per Tube Q1H  . insulin aspart  0-24 Units Subcutaneous Q4H  . insulin aspart  5 Units Subcutaneous Q4H  . insulin detemir  8 Units Subcutaneous BID  . ipratropium  0.5 mg Nebulization TID BM  . levalbuterol  1.25 mg Nebulization TID BM  . mouth rinse  15 mL Mouth Rinse 10 times per day  . midodrine  15 mg Per Tube TID WC  . nutrition supplement (JUVEN)  1 packet Per Tube BID BM  . sodium chloride flush  3 mL Intravenous Q12H  . sodium chloride flush  5 mL Intracatheter Q8H  . thiamine injection  100 mg Intravenous Daily    Infusions: . sodium chloride 250 mL (08/13/19 1412)  . sodium chloride Stopped (08/13/19 1631)  . bivalirudin (ANGIOMAX)  infusion 0.5 mg/mL (Non-ACS indications) 0.055 mg/kg/hr (08/31/19 0000)  . feeding supplement (PIVOT 1.5 CAL) 1,000 mL (08/30/19 1700)  . fluconazole (DIFLUCAN) IV 400 mg (08/31/19 0218)  . lactated ringers 20 mL/hr at 08/10/19 1705  . lactated ringers 10 mL/hr at 08/18/19 0054  . meropenem (MERREM) IV 1 g (08/31/19 0547)  . milrinone 0.125 mcg/kg/min (08/31/19 0000)  . norepinephrine (LEVOPHED) Adult infusion 16 mcg/min (08/31/19 0000)  . vancomycin      PRN Medications: sodium chloride, sodium chloride, acetaminophen, dextrose, fentaNYL (SUBLIMAZE) injection, lip balm, metoprolol tartrate, ondansetron (ZOFRAN) IV, sodium chloride flush   Assessment/Plan   1. Shock: Mixed cardiogenic/septic.  Echo with EF 20-25%, moderate RV dysfunction.  VA ECMO post-VF arrest, cannulated 6/25.  Stable s/p return to OR for mediastinal re-exploration due to bleeding on 6/25.  Decannulated from [place] ECMO on 08/06/19. Impella 5.5 placed. Underwent emergent chest re-exploration on 6/29 for tamponade and clot removal. Chest closed 7/2.  Impella 5.5 removed 7/9.  This morning, he is on NE 8, milrinone 0.125, off vasopressin.  Suspect primarily septic/vasodilatory shock with fever, elevated WBCs.  Weight up today, CVP not set up yet. Will order  - Continue midodrine 15 mg tid and continue to wean NE.  - Continue milrinone at 0.125 mcg - Give additional Lasix 40 mg IV x 1 today.  2. 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  Prolonged VF arrest on 6/25. Chest closed 7/2.  - ASA  - Hold Crestor with elevated LFTs.  3. Cardiac arrest: VF arrest with prolonged code. Episodes of VT/NSVT in setting of low K (difficult to effectively replace). K now stable, minimal ectopy.  - Continue PO amio 200 mg bid.   - Eventual evaluation for ICD or lifevest at d/c - Keep K > 4.0 Mg > 2.0  4. Acute hypoxemic respiratory failure: In setting of cardiac arrest.  H/o COPD.  Bibasilar infiltrates,  possible HCAP.  Now with tracheostomy. Diflucan with candida in BAL and blood culture.  More rhonchorous on exam.  - tracheal aspirate re cultured yesterday. NGTD  - Vanc + meropenum restarted yesterday  - Chest CT yesterday w/ concerns for ? Developing empyema.  - need to consider bronchoscopy 5. Acute blood loss anemia: Now s/p mediastinal re-exploration x 3. Hgb 7.8 today.  - Now on bivalirudin gtt. - transfused yesterday x 1 unit for hgb 6.9. Hgb 7.8  today.  - Monitor. 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  Platelets have recovered.  7. ID: Suspect HCAP initially, on meropenem/anidulafungin/vanco initially with ongoing fevers and leukocytosis. Venous dopplers negative for DVT. Possible drug fever from meropenem, he was switched to Zosyn 7/14 - 7/21.  He had abdominal US with gallbladder sludge, no definite cholecystitis.  HIDA scan done, GB does not opacify => ?acalculous cholecystitis => now with cholecystostomy tube.  Most recently, Candida in blood culture and BAL, he is on Diflucan. Exam by ophthalmology => no candidal endophthalmitis.  Foley and PICC removed 7/21.  WBCs remains elevated at 27 K. AF currently - ID following.  - Currently on Diflucan. Vanc + meropenum restarted yesterday  -  tracheal aspirate re cultured yesterday. NGTD  - Chest CT yesterday w/ concerns for ? Developing empyema.  - ?Bronchoscopy   8. Elevated bilirubin: Mostly direct = likely cholestatic/shock liver/RV failure predominantly.   Abdominal US with evidence for cirrhosis, sludge in GB but no definite acute cholecystitis. NH3 was not significantly elevated. Tbili 7.9 today (7.3 yesterday). HIDA scan showed patent common bile duct, so not obstructive stone causing elevated bilirubin.  9. Ileus: Improved.  Getting TFs.  10. Atrial fibrillation/flutter: Paroxysmal. In NSR today.     - Continue PO amio, 200 mg bid  - bivalirudin gtt.  11. Neuro: Now following commands  intermittently per nursing. CT head negative on 7/11. EEG with e/o diffuse encephalopathy.  12. Hypernatremia: Na 149 today.  - Continue free water boluses 300 cc q4 hrs.   Lyda Jester, PA-C  08/31/2019 7:48 AM  Patient seen with PA agree with the above note.   This morning, on milrinone 0.125 and NE 7 with co-ox 62%.  CVP not set up but weight trending back up.  Creatinine fairly stable at 1.34.    Febrile to 102 last night, WBCs 27.  CT chest yesterday with concern for developing empyema.   EEG with evidence for diffuse encephalitis.   General: Awake on vent Neck: Unable to assess JVD with trach, no thyromegaly or thyroid nodule.  Lungs: Decreased at bases.  CV: Nondisplaced PMI.  Heart regular S1/S2, no S3/S4, no murmur.  No peripheral edema.   Abdomen: Soft, nontender, no hepatosplenomegaly, no distention.  Skin: Intact without lesions or rashes.  Neurologic: Not following commands.  Extremities: No clubbing or cyanosis.  HEENT: Normal.   Ongoing fevers.  ID following.  Now on fluconazole for candidemia and back on meropenum and vancomycin.  ?Developing empyema on CT chest.  Discussed with Dr. Lynetta Mare, he will review for need for drainage.   Will stop milrinone today, co-ox consistently > 60%.  Continue to slowly wean down on norepinephrine with use of midodrine.  Suspect primarily septic/vasodilatory shock.    Weight rising, CVP needs to be set up again.  Lasix 40 mg IV bid today, follow response.   CRITICAL CARE Performed by: Loralie Champagne  Total critical care time: 35 minutes  Critical care time was exclusive of separately billable procedures and treating other patients.  Critical care was necessary to treat or prevent imminent or life-threatening deterioration.  Critical care was time spent personally by me on the following activities: development of treatment plan with patient and/or surrogate as well as nursing, discussions with consultants, evaluation of  patient's response to treatment, examination of patient, obtaining history from patient or surrogate, ordering and performing treatments and interventions, ordering and review of laboratory studies, ordering and review of radiographic  studies, pulse oximetry and re-evaluation of patient's condition.  Loralie Champagne 08/31/2019 1:00 PM

## 2019-08-31 NOTE — Progress Notes (Signed)
14 Days Post-Op Procedure(s) (LRB): REMOVAL OF IMPELLA LEFT VENTRICULAR ASSIST DEVICE (N/A) TRANSESOPHAGEAL ECHOCARDIOGRAM (TEE) (N/A) Subjective: Opening eyes, a little more alert  Objective: Vital signs in last 24 hours: Temp:  [98.2 F (36.8 C)-102 F (38.9 C)] 98.2 F (36.8 C) (07/23 0700) Pulse Rate:  [66-93] 74 (07/23 0743) Cardiac Rhythm: Normal sinus rhythm (07/22 2000) Resp:  [18-30] 21 (07/23 0743) BP: (119-143)/(48-75) 127/57 (07/23 0700) SpO2:  [95 %-100 %] 100 % (07/23 0743) Arterial Line BP: (93-143)/(38-72) 126/53 (07/23 0700) FiO2 (%):  [40 %] 40 % (07/23 0743) Weight:  [70.3 kg] 70.3 kg (07/23 0500)  Hemodynamic parameters for last 24 hours:    Intake/Output from previous day: 07/22 0701 - 07/23 0700 In: 3235.8 [I.V.:573; Blood:374.8; NG/GT:1445; IV Piggyback:843.1] Out: 1740 [Urine:1300; Drains:30; Stool:300; Chest Tube:110] Intake/Output this shift: No intake/output data recorded.  Intubated, opening eyes, not following commands Cardiac RRR Lungs clear anteriorly Abdomen soft Wounds intact Still jaundiced but improved  Lab Results: Recent Labs    08/30/19 0939 08/30/19 0939 08/30/19 2105 08/31/19 0230  WBC 27.0*  --   --  27.0*  HGB 6.9*   < > 8.5* 7.8*  HCT 25.3*   < > 29.6* 27.7*  PLT 399  --   --  364   < > = values in this interval not displayed.   BMET:  Recent Labs    08/30/19 1549 08/31/19 0230  NA 148* 149*  K 4.5 4.4  CL 117* 116*  CO2 22 23  GLUCOSE 176* 249*  BUN 64* 71*  CREATININE 1.32* 1.34*  CALCIUM 8.4* 8.3*    PT/INR: No results for input(s): LABPROT, INR in the last 72 hours. ABG    Component Value Date/Time   PHART 7.416 08/29/2019 1440   HCO3 25.7 08/29/2019 1440   TCO2 27 08/29/2019 1440   ACIDBASEDEF 1.0 08/08/2019 1115   O2SAT 68.1 08/31/2019 0230   CBG (last 3)  Recent Labs    08/31/19 0035 08/31/19 0437 08/31/19 0747  GLUCAP 272* 158* 178*    Assessment/Plan: S/P Procedure(s)  (LRB): REMOVAL OF IMPELLA LEFT VENTRICULAR ASSIST DEVICE (N/A) TRANSESOPHAGEAL ECHOCARDIOGRAM (TEE) (N/A) -Remains critically ill  NEURO- a little more alert today CV- in SR, co-ox 68 on milrinone 0.125, Levophed down to 7 RESP- VDRF, CT chest shows consolidation at bases, likely pneumonia, some small loculated effusions bilaterally despite CT RENAL- creatinine stable GI/ Nutrition- tolerating TF ID- persistent leukocytosis and fevers on Diflucan.  Vanco and meropenem restarted yesterday Anemia better after transfusion   LOS: 36 days    Todd [name] 08/31/2019

## 2019-08-31 NOTE — Progress Notes (Signed)
Subjective: Makes eye contact with me with verbal stimulus. Smiled.   Antibiotics:  Anti-infectives (From admission, onward)   Start     Dose/Rate Route Frequency Ordered Stop   08/31/19 1000  vancomycin (VANCOREADY) IVPB 1250 mg/250 mL     Discontinue     1,250 mg 166.7 mL/hr over 90 Minutes Intravenous Every 24 hours 08/30/19 1323     08/30/19 0930  meropenem (MERREM) 1 g in sodium chloride 0.9 % 100 mL IVPB     Discontinue     1 g 200 mL/hr over 30 Minutes Intravenous Every 8 hours 08/30/19 0917     08/30/19 0930  vancomycin (VANCOREADY) IVPB 1500 mg/300 mL        1,500 mg 150 mL/hr over 120 Minutes Intravenous  Once 08/30/19 0918 08/30/19 1152   08/25/19 0200  fluconazole (DIFLUCAN) IVPB 400 mg     Discontinue     400 mg 100 mL/hr over 120 Minutes Intravenous Every 24 hours 08/25/19 0146     08/22/19 1400  piperacillin-tazobactam (ZOSYN) IVPB 3.375 g  Status:  Discontinued        3.375 g 12.5 mL/hr over 240 Minutes Intravenous Every 8 hours 08/22/19 1032 08/29/19 1021   08/20/19 2200  vancomycin (VANCOREADY) IVPB 750 mg/150 mL  Status:  Discontinued        750 mg 150 mL/hr over 60 Minutes Intravenous Every 24 hours 08/19/19 1623 08/20/19 0758   08/20/19 0900  vancomycin (VANCOREADY) IVPB 750 mg/150 mL  Status:  Discontinued        750 mg 150 mL/hr over 60 Minutes Intravenous Every 24 hours 08/20/19 0758 08/21/19 0852   08/16/19 1400  vancomycin (VANCOCIN) IVPB 1000 mg/200 mL premix  Status:  Discontinued        1,000 mg 200 mL/hr over 60 Minutes Intravenous Every 24 hours 08/16/19 1108 08/19/19 1623   08/16/19 0745  anidulafungin ([name]) 100 mg in sodium chloride 0.9 % 100 mL IVPB  Status:  Discontinued       "Followed by" Linked Group Details   100 mg 78 mL/hr over 100 Minutes Intravenous Every 24 hours 08/15/19 0741 08/22/19 1032   08/15/19 0730  anidulafungin ([name]) 200 mg in sodium chloride 0.9 % 200 mL IVPB       "Followed by" Linked Group Details   200  mg 78 mL/hr over 200 Minutes Intravenous  Once 08/15/19 0741 08/15/19 1346   08/13/19 1400  meropenem (MERREM) 1 g in sodium chloride 0.9 % 100 mL IVPB  Status:  Discontinued        1 g 200 mL/hr over 30 Minutes Intravenous Every 8 hours 08/13/19 1236 08/22/19 1032   08/13/19 1400  vancomycin (VANCOREADY) IVPB 1500 mg/300 mL  Status:  Discontinued        1,500 mg 150 mL/hr over 120 Minutes Intravenous Every 24 hours 08/13/19 1244 08/16/19 1108   08/10/19 1504  vancomycin (VANCOCIN) 1,000 mg in sodium chloride 0.9 % 1,000 mL irrigation  Status:  Discontinued          As needed 08/10/19 1505 08/10/19 1840   08/10/19 1500  vancomycin (VANCOCIN) 1,000 mg in sodium chloride 0.9 % 1,000 mL irrigation  Status:  Discontinued         Irrigation To Surgery 08/10/19 1448 08/10/19 1453   08/10/19 1500  vancomycin (VANCOCIN) 1,000 mg in sodium chloride 0.9 % 1,000 mL irrigation  Status:  Discontinued  Irrigation To Surgery 08/10/19 1454 08/10/19 1855   08/10/19 0800  vancomycin (VANCOREADY) IVPB 1750 mg/350 mL        1,750 mg 175 mL/hr over 120 Minutes Intravenous Every 36 hours 08/09/19 1426 08/11/19 2356   08/08/19 2232  vancomycin variable dose per unstable renal function (pharmacist dosing)  Status:  Discontinued         Does not apply See admin instructions 08/08/19 2232 08/09/19 1426   08/06/19 1827  vancomycin (VANCOCIN) powder  Status:  Discontinued          As needed 08/06/19 1828 08/06/19 1936   08/06/19 1503  vancomycin (VANCOCIN) 1,000 mg in sodium chloride 0.9 % 1,000 mL irrigation  Status:  Discontinued          As needed 08/06/19 1504 08/06/19 1936   08/04/19 2100  vancomycin (VANCOCIN) IVPB 1000 mg/200 mL premix  Status:  Discontinued        1,000 mg 200 mL/hr over 60 Minutes Intravenous Every 12 hours 08/04/19 0721 08/08/19 2225   08/04/19 0730  vancomycin (VANCOREADY) IVPB 2000 mg/400 mL        2,000 mg 200 mL/hr over 120 Minutes Intravenous  Once 08/04/19 0721 08/04/19 1003     08/04/19 0730  meropenem (MERREM) 1 g in sodium chloride 0.9 % 100 mL IVPB        1 g 200 mL/hr over 30 Minutes Intravenous Every 8 hours 08/04/19 0721 08/12/19 2220   08/02/19 2030  vancomycin (VANCOCIN) IVPB 1000 mg/200 mL premix        1,000 mg 200 mL/hr over 60 Minutes Intravenous  Once 08/02/19 1516 08/02/19 2151   08/02/19 1530  cefUROXime (ZINACEF) 1.5 g in sodium chloride 0.9 % 100 mL IVPB        1.5 g 200 mL/hr over 30 Minutes Intravenous Every 12 hours 08/02/19 1516 08/04/19 0430   08/02/19 0400  vancomycin (VANCOREADY) IVPB 1250 mg/250 mL        1,250 mg 166.7 mL/hr over 90 Minutes Intravenous To Surgery 08/01/19 1422 08/02/19 0923   08/02/19 0400  cefUROXime (ZINACEF) 1.5 g in sodium chloride 0.9 % 100 mL IVPB        1.5 g 200 mL/hr over 30 Minutes Intravenous To Surgery 08/01/19 1422 08/02/19 0853   08/02/19 0400  cefUROXime (ZINACEF) 750 mg in sodium chloride 0.9 % 100 mL IVPB        750 mg 200 mL/hr over 30 Minutes Intravenous To Surgery 08/01/19 1422 08/02/19 1430   08/01/19 0400  vancomycin (VANCOREADY) IVPB 1250 mg/250 mL  Status:  Discontinued        1,250 mg 166.7 mL/hr over 90 Minutes Intravenous To Surgery 07/31/19 1034 08/01/19 1421   08/01/19 0400  cefUROXime (ZINACEF) 1.5 g in sodium chloride 0.9 % 100 mL IVPB  Status:  Discontinued        1.5 g 200 mL/hr over 30 Minutes Intravenous To Surgery 07/31/19 1034 08/01/19 1421   08/01/19 0400  cefUROXime (ZINACEF) 750 mg in sodium chloride 0.9 % 100 mL IVPB  Status:  Discontinued        750 mg 200 mL/hr over 30 Minutes Intravenous To Surgery 07/31/19 1034 08/01/19 1421      Medications: Scheduled Meds: . amiodarone  200 mg Per Tube BID  . aspirin  81 mg Per Tube Daily  . chlorhexidine gluconate (MEDLINE KIT)  15 mL Mouth Rinse BID  . Chlorhexidine Gluconate Cloth  6 each Topical Daily  . cholestyramine  4 g Oral BID  . collagenase   Topical Daily  . docusate  200 mg Per Tube Daily  . feeding supplement  (PROSource TF)  45 mL Per Tube Daily  . free water  100 mL Per Tube Q1H  . furosemide  40 mg Intravenous Once  . insulin aspart  0-24 Units Subcutaneous Q4H  . insulin aspart  5 Units Subcutaneous Q4H  . insulin detemir  8 Units Subcutaneous BID  . ipratropium  0.5 mg Nebulization TID BM  . levalbuterol  1.25 mg Nebulization TID BM  . mouth rinse  15 mL Mouth Rinse 10 times per day  . midodrine  15 mg Per Tube TID WC  . nutrition supplement (JUVEN)  1 packet Per Tube BID BM  . sodium chloride flush  3 mL Intravenous Q12H  . sodium chloride flush  5 mL Intracatheter Q8H  . sodium chloride HYPERTONIC  4 mL Nebulization BID  . thiamine injection  100 mg Intravenous Daily   Continuous Infusions: . sodium chloride 250 mL (08/13/19 1412)  . sodium chloride Stopped (08/13/19 1631)  . bivalirudin (ANGIOMAX) infusion 0.5 mg/mL (Non-ACS indications) 0.055 mg/kg/hr (08/31/19 1200)  . feeding supplement (PIVOT 1.5 CAL) 1,000 mL (08/31/19 1145)  . fluconazole (DIFLUCAN) IV Stopped (08/31/19 0428)  . lactated ringers 20 mL/hr at 08/10/19 1705  . lactated ringers 10 mL/hr at 08/18/19 0054  . meropenem (MERREM) IV Stopped (08/31/19 0617)  . norepinephrine (LEVOPHED) Adult infusion 7 mcg/min (08/31/19 1200)  . vancomycin Stopped (08/31/19 1052)   PRN Meds:.sodium chloride, sodium chloride, acetaminophen (TYLENOL) oral liquid 160 mg/5 mL, dextrose, fentaNYL (SUBLIMAZE) injection, lip balm, metoprolol tartrate, ondansetron (ZOFRAN) IV, sodium chloride flush    Objective: Weight change: 3.1 kg  Intake/Output Summary (Last 24 hours) at 08/31/2019 1437 Last data filed at 08/31/2019 1200 Gross per 24 hour  Intake 3868.76 ml  Output 1740 ml  Net 2128.76 ml   Blood pressure (!) 118/49, pulse 80, temperature 97.7 F (36.5 C), temperature source Axillary, resp. rate 20, height _0  (1.753 m), weight 70.3 kg, SpO2 100 %. Temp:  [97.7 F (36.5 C)-102 F (38.9 C)] 97.7 F (36.5 C) (07/23 0900) Pulse  Rate:  [66-93] 80 (07/23 1352) Resp:  [14-29] 20 (07/23 1352) BP: (116-143)/(46-75) 118/49 (07/23 1200) SpO2:  [95 %-100 %] 100 % (07/23 1200) Arterial Line BP: (93-139)/(38-72) 113/50 (07/23 1200) FiO2 (%):  [40 %] 40 % (07/23 1352) Weight:  [70.3 kg] 70.3 kg (07/23 0500)  Physical Exam: General:confused HEENT: icteric sclera, EOMI, trach site clean and dry  CVS regular rate,  Chest: , Rhonchorous, accessory muscle use, full vent support  Abdomen: Biliary drain with bilious material; abdomen distended, patient grimaced with palpation to all quadrants. Watery seedy stool noted in rectal tube  Extremities: Jaundiced, improved Neuro: nonfocal  CBC: Lab Results  Component Value Date   WBC 27.0 (H) 08/31/2019   HGB 7.8 (L) 08/31/2019   HCT 27.7 (L) 08/31/2019   MCV 106.9 (H) 08/31/2019   PLT 364 08/31/2019     BMET Recent Labs    08/30/19 1549 08/31/19 0230  NA 148* 149*  K 4.5 4.4  CL 117* 116*  CO2 22 23  GLUCOSE 176* 249*  BUN 64* 71*  CREATININE 1.32* 1.34*  CALCIUM 8.4* 8.3*     Liver Panel  Recent Labs    08/30/19 0513 08/30/19 0513 08/30/19 1549 08/31/19 0230  PROT 5.7*  --   --  5.8*  ALBUMIN 1.9*   < >  1.9* 1.9*  AST 91*  --   --  94*  ALT 106*  --   --  108*  ALKPHOS 350*  --   --  338*  BILITOT 7.3*  --   --  7.9*   < > = values in this interval not displayed.       Sedimentation Rate No results for input(s): ESRSEDRATE in the last 72 hours. C-Reactive Protein No results for input(s): CRP in the last 72 hours.  Micro Results: Recent Results (from the past 720 hour(s))  Surgical pcr screen     Status: None   Collection Time: 08/01/19 10:07 PM   Specimen: Nasal Mucosa; Nasal Swab  Result Value Ref Range Status   MRSA, PCR NEGATIVE NEGATIVE Final   Staphylococcus aureus NEGATIVE NEGATIVE Final    Comment: (NOTE) The Xpert SA Assay (FDA approved for NASAL specimens in patients 92 years of age and older), is one component of a  comprehensive surveillance program. It is not intended to diagnose infection nor to guide or monitor treatment. Performed at [facility], [place] [address], [place], [place] [address]   Culture, respiratory (non-expectorated)     Status: None   Collection Time: 08/11/19  3:32 PM   Specimen: Tracheal Aspirate; Respiratory  Result Value Ref Range Status   Specimen Description TRACHEAL ASPIRATE  Final   Special Requests Normal  Final   Gram Stain   Final    NO WBC SEEN NO SQUAMOUS EPITHELIAL CELLS SEEN RARE BUDDING YEAST SEEN Performed at [facility], [address], [place], [place] [address]    Culture FEW CANDIDA TROPICALIS  Final   Report Status 08/13/2019 FINAL  Final  Culture, blood (Routine X 2) w Reflex to ID Panel     Status: None   Collection Time: 08/13/19  8:27 AM   Specimen: BLOOD  Result Value Ref Range Status   Specimen Description BLOOD BLOOD LEFT ARM  Final   Special Requests   Final    BOTTLES DRAWN AEROBIC AND ANAEROBIC Blood Culture adequate volume   Culture   Final    NO GROWTH 5 DAYS Performed at [facility], [place] [address] [place], [place] [address]    Report Status 08/18/2019 FINAL  Final  Culture, blood (Routine X 2) w Reflex to ID Panel     Status: None   Collection Time: 08/13/19  8:31 AM   Specimen: BLOOD  Result Value Ref Range Status   Specimen Description BLOOD BLOOD LEFT HAND  Final   Special Requests   Final    BOTTLES DRAWN AEROBIC AND ANAEROBIC Blood Culture adequate volume   Culture   Final    NO GROWTH 5 DAYS Performed at [facility], [place] [address], [place], [place] [address]    Report Status 08/18/2019 FINAL  Final  Culture, respiratory (non-expectorated)     Status: None   Collection Time: 08/13/19  4:12 PM   Specimen: Tracheal Aspirate; Respiratory  Result Value Ref Range Status   Specimen Description TRACHEAL ASPIRATE  Final   Special Requests NONE  Final   Gram Stain   Final    NO WBC SEEN FEW  YEAST Performed at [facility], [place] [address], [place], [place] [address]    Culture FEW CANDIDA TROPICALIS  Final   Report Status 08/16/2019 FINAL  Final  Culture, Urine     Status: None   Collection Time: 08/15/19  6:13 PM  Specimen: Urine, Catheterized  Result Value Ref Range Status   Specimen Description URINE, CATHETERIZED  Final   Special Requests NONE  Final   Culture   Final    NO GROWTH Performed at [facility], [address], [place], [place] [address]    Report Status 08/17/2019 FINAL  Final  Culture, blood (routine x 2)     Status: None   Collection Time: 08/15/19  7:53 PM   Specimen: BLOOD RIGHT HAND  Result Value Ref Range Status   Specimen Description BLOOD RIGHT HAND  Final   Special Requests   Final    BOTTLES DRAWN AEROBIC AND ANAEROBIC Blood Culture adequate volume   Culture   Final    NO GROWTH 5 DAYS Performed at  [facility], [place] [address], [place], [place] [address]    Report Status 08/20/2019 FINAL  Final  Culture, blood (routine x 2)     Status: None   Collection Time: 08/15/19  7:53 PM   Specimen: BLOOD RIGHT HAND  Result Value Ref Range Status   Specimen Description BLOOD RIGHT HAND  Final   Special Requests   Final    BOTTLES DRAWN AEROBIC AND ANAEROBIC Blood Culture adequate volume   Culture   Final    NO GROWTH 5 DAYS Performed at [facility], [place] [address] [place], [place] [address]    Report Status 08/20/2019 FINAL  Final  Aerobic/Anaerobic Culture (surgical/deep wound)     Status: None   Collection Time: 08/17/19  2:32 PM   Specimen: Wound  Result Value Ref Range Status   Specimen Description WOUND  Final   Special Requests NONE  Final   Gram Stain NO WBC SEEN NO ORGANISMS SEEN   Final   Culture   Final    No growth aerobically or anaerobically. Performed at [facility], [place] [address] [place], [place] [address]    Report Status 08/22/2019 FINAL  Final  Culture, fungus without smear      Status: Abnormal (Preliminary result)   Collection Time: 08/20/19  2:00 PM   Specimen: Bronchoalveolar Lavage; Other  Result Value Ref Range Status   Specimen Description BRONCHIAL ALVEOLAR LAVAGE  Final   Special Requests   Final    Normal Performed at [facility], [place] [address], [place], [place] [address]    Culture CANDIDA ALBICANS (A)  Final   Report Status PENDING  Incomplete  Culture, bal-quantitative     Status: Abnormal   Collection Time: 08/20/19  2:00 PM   Specimen: Bronchoalveolar Lavage; Respiratory  Result Value Ref Range Status   Specimen Description BRONCHIAL ALVEOLAR LAVAGE  Final   Special Requests NONE  Final   Gram Stain   Final    FEW WBC PRESENT, PREDOMINANTLY PMN NO ORGANISMS SEEN Performed at [facility], [address], [place],  [address]    Culture (A)  Final    4,000 COLONIES/mL CANDIDA TROPICALIS 3,000 COLONIES/mL CANDIDA ALBICANS    Report Status 08/25/2019 FINAL  Final  Body fluid culture     Status: None   Collection Time: 08/22/19  9:14 AM   Specimen: Pleura; Body Fluid  Result Value Ref Range Status   Specimen Description PLEURAL  Final   Special Requests NONE  Final   Gram Stain   Final    NO WBC SEEN NO ORGANISMS SEEN Performed at [facility], [address] [place],  [address]  Culture RARE CANDIDA PARAPSILOSIS  Final   Report Status 08/25/2019 FINAL  Final  Culture, blood (routine x 2)     Status: Abnormal   Collection Time: 08/22/19 12:58 PM   Specimen: BLOOD RIGHT HAND  Result Value Ref Range Status   Specimen Description BLOOD RIGHT HAND  Final   Special Requests   Final    BOTTLES DRAWN AEROBIC ONLY Blood Culture adequate volume   Culture  Setup Time   Final    AEROBIC BOTTLE ONLY YEAST CRITICAL RESULT CALLED TO, READ BACK BY AND VERIFIED WITH: Karsten Ro [facility] 08/25/19 0123 JDW Performed at [facility], [place] [address], [place], [place] [address]    Culture CANDIDA PARAPSILOSIS  (A)  Final   Report Status 08/26/2019 FINAL  Final  Culture, blood (routine x 2)     Status: None   Collection Time: 08/22/19 12:58 PM   Specimen: BLOOD RIGHT HAND  Result Value Ref Range Status   Specimen Description BLOOD RIGHT HAND  Final   Special Requests   Final    BOTTLES DRAWN AEROBIC ONLY Blood Culture adequate volume   Culture   Final    NO GROWTH 5 DAYS Performed at [facility], [place] [address] [place], [place] [address]    Report Status 08/27/2019 FINAL  Final  Blood Culture ID Panel (Reflexed)     Status: Abnormal   Collection Time: 08/22/19 12:58 PM  Result Value Ref Range Status   Enterococcus species NOT DETECTED NOT DETECTED Final   Listeria monocytogenes NOT DETECTED NOT DETECTED Final   Staphylococcus species NOT DETECTED NOT DETECTED Final   Staphylococcus aureus (BCID) NOT DETECTED NOT DETECTED Final   Streptococcus species NOT DETECTED NOT DETECTED Final   Streptococcus agalactiae NOT DETECTED NOT DETECTED Final   Streptococcus pneumoniae NOT DETECTED NOT DETECTED Final   Streptococcus pyogenes NOT DETECTED NOT DETECTED Final   Acinetobacter baumannii NOT DETECTED NOT DETECTED Final   Enterobacteriaceae species NOT DETECTED NOT DETECTED Final   Enterobacter cloacae complex NOT DETECTED NOT DETECTED Final   Escherichia coli NOT DETECTED NOT DETECTED Final   Klebsiella oxytoca NOT DETECTED NOT DETECTED Final   Klebsiella pneumoniae NOT DETECTED NOT DETECTED Final   Proteus species NOT DETECTED NOT DETECTED Final   Serratia marcescens NOT DETECTED NOT DETECTED Final   Haemophilus influenzae NOT DETECTED NOT DETECTED Final   Neisseria meningitidis NOT DETECTED NOT DETECTED Final   Pseudomonas aeruginosa NOT DETECTED NOT DETECTED Final   Candida albicans NOT DETECTED NOT DETECTED Final   Candida glabrata NOT DETECTED NOT DETECTED Final   Candida krusei NOT DETECTED NOT DETECTED Final   Candida parapsilosis DETECTED (A) NOT DETECTED Final    Comment:  CRITICAL RESULT CALLED TO, READ BACK BY AND VERIFIED WITH: J LEDFORD PHARMD 08/25/19 0123 JDW    Candida tropicalis NOT DETECTED NOT DETECTED Final    Comment: Performed at [facility], [address], [place], [place] [address]  Aerobic/Anaerobic Culture (surgical/deep wound)     Status: None   Collection Time: 08/24/19  1:35 PM   Specimen: Abscess  Result Value Ref Range Status   Specimen Description ABSCESS  Final   Special Requests DRAIN  Final   Gram Stain   Final    RARE WBC PRESENT, PREDOMINANTLY PMN NO ORGANISMS SEEN    Culture   Final    No growth aerobically or anaerobically. Performed at [facility], [place] [address] [place], [place] [address]  Report Status 08/29/2019 FINAL  Final  Culture, blood (routine x 2)     Status: None   Collection Time: 08/25/19  3:53 PM   Specimen: BLOOD RIGHT HAND  Result Value Ref Range Status   Specimen Description BLOOD RIGHT HAND  Final   Special Requests   Final    BOTTLES DRAWN AEROBIC AND ANAEROBIC Blood Culture adequate volume   Culture   Final    NO GROWTH 5 DAYS Performed at [facility], [place] [address], [place], [place] [address]    Report Status 08/30/2019 FINAL  Final  Culture, blood (routine x 2)     Status: None   Collection Time: 08/25/19  3:53 PM   Specimen: BLOOD RIGHT ARM  Result Value Ref Range Status   Specimen Description BLOOD RIGHT ARM  Final   Special Requests   Final    BOTTLES DRAWN AEROBIC ONLY Blood Culture adequate volume   Culture   Final    NO GROWTH 5 DAYS Performed at [facility], [place] [address] [place], [place] [address]    Report Status 08/30/2019 FINAL  Final  Culture, blood (routine x 2)     Status: None (Preliminary result)   Collection Time: 08/29/19  9:28 AM   Specimen: BLOOD RIGHT HAND  Result Value Ref Range Status   Specimen Description BLOOD RIGHT HAND  Final   Special Requests   Final    BOTTLES DRAWN AEROBIC ONLY Blood Culture adequate volume   Culture    Final    NO GROWTH 2 DAYS Performed at [facility], [place] [address], [place], [place] [address]    Report Status PENDING  Incomplete  Culture, blood (Routine X 2) w Reflex to ID Panel     Status: None (Preliminary result)   Collection Time: 08/29/19  4:00 PM   Specimen: BLOOD  Result Value Ref Range Status   Specimen Description BLOOD CENTRAL LINE  Final   Special Requests   Final    BOTTLES DRAWN AEROBIC AND ANAEROBIC Blood Culture adequate volume   Culture   Final    NO GROWTH 2 DAYS Performed at [facility], [place] [address], [place], [place] [address]    Report Status PENDING  Incomplete  Culture, respiratory (non-expectorated)     Status: None (Preliminary result)   Collection Time: 08/30/19  8:34 AM   Specimen: Tracheal Aspirate; Respiratory  Result Value Ref Range Status   Specimen Description TRACHEAL ASPIRATE  Final   Special Requests NONE  Final   Gram Stain   Final    RARE WBC PRESENT,BOTH PMN AND MONONUCLEAR NO ORGANISMS SEEN    Culture   Final    CULTURE REINCUBATED FOR BETTER GROWTH Performed at [facility], [place] [address] [place], [place] [address]    Report Status PENDING  Incomplete    Studies/Results: EEG  Result Date: 08/31/2019 Lora Havens, MD     08/31/2019 12:26 PM Patient Name: Todd [name] MRN: [id number] Epilepsy Attending: Lora Havens Referring Physician/Provider: Dr Madalyn Rob Date: 08/31/2019 Duration: 26.16 mins Patient history: 64yo M with ams. EEG to evaluate for seizure Level of alertness:  lethargic AEDs during EEG study: None Technical aspects: This EEG study was done with scalp electrodes positioned according to the 10-20 International system of electrode placement. Electrical activity was acquired at a sampling rate of _0  and reviewed with a high frequency filter of _1  and a low frequency filter of  _0 . EEG data were recorded continuously and digitally stored. Description: No posterior dominant rhythm. EEG  showed continuous generalized mixed frequencies with predominantly 5 to 6 Hz theta slowing with intermittent 13-_1  frontocentral beta activity as well as 2-_2  intermittent delta slowing. Hyperventilation and photic stimulation were not performed.   ABNORMALITY. -Continuous slow, generalized IMPRESSION: This study is suggestive of severe diffuse encephalopathy, nonspecific etiology but likely related to sedation, toxic-metabolic etiology. No seizures or epileptiform discharges were seen throughout the recording. Lora Havens   CT HEAD WO CONTRAST  Result Date: 08/30/2019 CLINICAL DATA:  Encephalopathy, altered mental status EXAM: CT HEAD WITHOUT CONTRAST TECHNIQUE: Contiguous axial images were obtained from the base of the skull through the vertex without intravenous contrast. 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 Unchanged appearance from prior. No evidence of acute infarction, hemorrhage, hydrocephalus, extra-axial collection or mass lesion/mass effect. Symmetric prominence of the ventricles, cisterns and sulci compatible with parenchymal volume loss. Patchy areas of white matter hypoattenuation are most compatible with chronic microvascular angiopathy. Vascular: Atherosclerotic calcification of the carotid siphons and intradural vertebral arteries. No hyperdense vessel. Skull: Image quality degraded by motion artifact. No significant swelling or calvarial fracture. No worrisome osseous lesions. Sinuses/Orbits: Paranasal sinuses predominantly clear with a nasoenteric tube in place. Bilateral mastoid and middle ear effusions are noted. Included orbital structures are unremarkable. Other: Edentulous. IMPRESSION: 1. Image quality degraded by motion artifact. 2. No definite acute intracranial abnormality. 3. 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 4. Chronic  microvascular angiopathy and parenchymal volume loss. 5. Bilateral mastoid and middle ear effusions. Similar finding on prior. Electronically Signed   By: Lovena Le M.D.   On: 08/30/2019 15:36   CT CHEST WO CONTRAST  Result Date: 08/30/2019 CLINICAL DATA:  Chest radiograph 08/30/2019, CT abdomen pelvis 08/28/2019 EXAM: CT CHEST WITHOUT CONTRAST TECHNIQUE: Multidetector CT imaging of the chest was performed following the standard protocol without IV contrast. COMPARISON:  Chest radiograph 08/30/2019, CT abdomen and pelvis 08/28/2019 FINDINGS: Cardiovascular: Cardiomegaly. Small volume pericardial fluid. Hypoattenuation of the cardiac blood pool relative to the myocardium compatible with anemia. Evidence of prior sternotomy and CABG as well as postsurgical changes from recent LVAD removal. Atherosclerotic plaque within the normal caliber aorta. Plaque noted throughout the proximal great vessels as well. Mild central pulmonary artery enlargement, could reflect some pulmonary artery hypertension. Left IJ approach central venous catheter tip terminates in the lower SVC. Mediastinum/Nodes: Postsurgical changes from recent sternotomy with a small retrosternal fluid collection, nonspecific in the setting of recent operative intervention, measuring 3.7 x 3.1 cm in size (3/91). No mediastinal gas. Additional stranding in the anterior mediastinum likely related to operative intervention as well. Evidence of prior CABG. Tracheostomy tube is in place, tip terminating in the mid trachea 5 cm from the carina. Small amount of fluid above the tracheostomy balloon. A transesophageal tube is noted with the tip terminating beyond the gastric antrum, incompletely visualized. Scattered low-attenuation subcentimeter nodes throughout the mediastinum are likely reactive. Hilar adenopathy difficult to assess in the absence of contrast media. No worrisome axillary lymph nodes. Thyroid gland and thoracic inlet are free of acute  abnormality. Lungs/Pleura: Bilateral pleural effusions despite the presence of bilateral apically directed chest tubes which remain in position. Portions of these collections appear somewhat loculated with extension into the fissures. Associated passive atelectasis is noted with additional areas of mixed heterogeneous and ground-glass opacity within the  lungs as well as more heterogeneous attenuation of the atelectatic lung itself which could suggest some underlying infection. Findings are on a background diffuse airways thickening and scattered secretions as well as moderate to severe paraseptal predominant emphysematous changes throughout the lungs. No visible residual pneumothorax. Upper Abdomen: Percutaneous cholecystostomy tube in place. Small volume ascites in the upper abdomen. Stable mild bilateral perinephric stranding. No acute abnormalities present in the visualized portions of the upper abdomen. Lobular thickening of the adrenal glands similar to prior. Musculoskeletal: Multilevel degenerative changes are present in the imaged portions of the spine. Recent sternotomy. Diffuse body wall edema. Postsurgical changes in the soft tissues of the right axillary region may reflect recent cutdown or access attempt. IMPRESSION: 1. Postsurgical changes sternotomy a likely related to recent LVAD removal. Retrosternal fluid collection, nonspecific given the operative history. Infection cannot be fully excluded. 2. Bilateral pleural effusions despite the presence of bilateral apically directed chest tubes which remain in position. Portions of these collections appear somewhat loculated with extension into the fissures concerning for developing empyema. With heterogeneity within the areas of dependent and passive atelectasis and additional areas of mixed consolidative and ground-glass airspace disease suggesting superimposed infection, inflammation, or given airways thickening and secretions, sequela of aspiration. 3.  Tracheostomy tube is in place, tip terminating in the mid trachea 5 cm from the carina. Small amount of fluid above the tracheostomy balloon. 4. Small volume ascites in the upper abdomen. 5. Percutaneous cholecystostomy tube in place. 6. Diffuse body wall edema. 7. Cardiomegaly, evidence of prior CABG. Extensive native coronary artery calcification. 8. Likely anemia. 9. Aortic Atherosclerosis (ICD10-I70.0). Electronically Signed   By: Lovena Le M.D.   On: 08/30/2019 15:48   DG Chest Port 1 View  Result Date: 08/31/2019 CLINICAL DATA:  Tracheostomy.  Chest tubes. EXAM: PORTABLE CHEST 1 VIEW COMPARISON:  CT 08/30/2019. FINDINGS: Tracheostomy tube, feeding tube, left IJ line, bilateral chest tubes in stable position. Prior CABG. Stable cardiomegaly. Diffuse bilateral pulmonary infiltrates/edema slightly progressed from prior exam. Small bilateral pleural effusions. No pneumothorax. IMPRESSION: 1. Lines and tubes including bilateral chest tubes in stable position. No pneumothorax. 2. Prior CABG. Cardiomegaly again noted. Diffuse bilateral pulmonary infiltrates/edema slightly progressed from prior exam. Small bilateral pleural effusions. Electronically Signed   By: Marcello Moores  Register   On: 08/31/2019 08:19   DG CHEST PORT 1 VIEW  Result Date: 08/30/2019 CLINICAL DATA:  Chest tube. EXAM: PORTABLE CHEST 1 VIEW COMPARISON:  08/27/2019. FINDINGS: Interim removal of left PICC line and placement of left IJ line. Left IJ line tip at cavoatrial junction. Tracheostomy tube and feeding tube in stable position. Bilateral chest tubes in stable position. Prior CABG. Stable cardiomegaly. Diffuse bilateral interstitial prominence consistent with interstitial edema and/or pneumonitis again noted. Small right pleural effusion cannot be excluded. Left costophrenic angle not completely imaged. No pneumothorax. Surgical staples noted over the right chest. IMPRESSION: 1. Interim removal of left PICC line and placement of left IJ  line. Left IJ line tip at cavoatrial junction. Tracheostomy tube and feeding tube in stable position. Bilateral chest tubes stable position. No pneumothorax. 2. Prior CABG. Stable cardiomegaly. Diffuse bilateral interstitial prominence consistent interstitial edema and/or pneumonitis again noted. Similar findings noted on prior exam. Small right pleural effusion cannot be excluded. Left costophrenic angle incompletely imaged. Electronically Signed   By: Marcello Moores  Register   On: 08/30/2019 07:06   DG Chest Port 1 View  Result Date: 08/29/2019 CLINICAL DATA:  Central line placement EXAM: PORTABLE CHEST 1 VIEW COMPARISON:  Earlier same day FINDINGS: Left internal jugular central line tip in the SVC 2 cm above the right atrium. Left arm PICC tip at the SVC RA junction or just in the right atrium. No pneumothorax. Left chest tube remains in place. Soft feeding tube enters the abdomen. Tracheostomy in place. Atelectasis in the lower lungs persists, left worse than right. IMPRESSION: Left internal jugular central line tip in the SVC 2 cm above the right atrium. No pneumothorax. No other change. Electronically Signed   By: Nelson Chimes M.D.   On: 08/29/2019 17:16   DG CHEST PORT 1 VIEW  Result Date: 08/29/2019 CLINICAL DATA:  Hypoxia EXAM: PORTABLE CHEST 1 VIEW COMPARISON:  08/27/2019 FINDINGS: Tracheostomy tube and feeding catheter are again noted and stable. Postsurgical changes are again seen. Left-sided PICC line is noted and stable. Bilateral thoracostomy catheters are seen without evidence of pneumothorax. Stable left retrocardiac opacity is noted. The previously seen interstitial prominence has improved in the interval from the prior exam. IMPRESSION: Persistent left retrocardiac opacity. Tubes and lines as described. Electronically Signed   By: Inez Catalina M.D.   On: 08/29/2019 15:31      Assessment/Plan:  INTERVAL HISTORY: ongoing fevers overnight until early this morning.  Culture re-incubating from  tracheal aspirate.    Principal Problem:   Candidemia ([place]) Active Problems:   Acalculous cholecystitis   CHF (congestive heart failure) (HCC)   Chronic pain syndrome   Mixed hyperlipidemia   Moderate recurrent major depression (HCC)   Hypothyroidism   Acute systolic heart failure (HCC)   Type 2 diabetes mellitus with hyperglycemia (HCC)   AKI (acute kidney injury) ([place])   Coronary artery disease involving native coronary artery of native heart with unstable angina pectoris ([place])   Coronary artery disease   Malnutrition of moderate degree   Pressure injury of skin   Acute on chronic respiratory failure (HCC)   Acute respiratory failure (HCC)   S/P CABG (coronary artery bypass graft)   ARDS (adult respiratory distress syndrome) ([place])   Cardiogenic shock ([place])    Todd [name] is a 64 y.o. male with  complicated hospitalization following CABG that required [place] ECMO>>Impella support following several arrests and open chest procedures at the bedside. He has been on long term carbapenem up until recently when we switched him to zosyn and had gallbladder drain placed for rising bilirubinemia/cholangitis --> no obstructing stones on HIDA, ?Acalculous cholecystitis.   No growth from aspirated bile from perc-chole drain He was found to have candida parapsilosis in blood Fevering despite broad-spectrum antibiotics.    PLAN:  #1 candidemia: d/t c. Parapsilosis  Continue fluconazole - lines removed that were in place back on 7/14 when fungemic. Ophthalmology exam limited by patient factors.   #2 possible acalculous cholecystitis - CT scan unrevealing, LFTs improving slowly. GB drain remains in place.   #3  Fevers: CT head unremarkable for infection, Chest with possible empyema. Awaiting growth if any from tracheal aspirate, nothing on gram stain.  Given extensive exposure to broad-spectrum antibiotics, fevers, leukocytosis and what appears to be abdominal tenderness on exam today will check  for CDiff.      LOS: 36 days   Janene Madeira 08/31/2019, 2:37 PM

## 2019-08-31 NOTE — Progress Notes (Signed)
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    Assessment: 64 yo male s/p ECMO and Impella. Chest closed 7/2. CT drainage decreasing.    HIT ab sent and returned as 2.6 - positive, switched from heparin to bivalirudin. SRA positive, confirmed for HIT.  Bivalirudin started 08/16/19 APTT (72sec) remains therapeutic on bivalirudin 0.055 mg/kg/hr (wt 71.7kg). Hgb slight drop[ 6.9 - no bleeding noted - watch, platelets stable 300s. No bleeding noted per RN.   Goals of Therapy: APTT 50-85 secs  Plan: Continue bivalirudin at 0.055 mg/kg/hr Monitor daily aPTT, CBC, and for s/sx of bleeding  Leota Sauers Pharm.D. CPP, BCPS Clinical Pharmacist [phone] 08/31/2019 8:34 AM    Please check [email or web address] for unit-specific pharmacist phone numbers

## 2019-09-01 DIAGNOSIS — B377 Candidal sepsis: Secondary | ICD-10-CM | POA: Diagnosis not present

## 2019-09-01 DIAGNOSIS — J9622 Acute and chronic respiratory failure with hypercapnia: Secondary | ICD-10-CM | POA: Diagnosis not present

## 2019-09-01 LAB — RENAL FUNCTION PANEL
Albumin: 1.7 g/dL — ABNORMAL LOW (ref 3.5–5.0)
Anion gap: 6 (ref 5–15)
BUN: 80 mg/dL — ABNORMAL HIGH (ref 8–23)
CO2: 26 mmol/L (ref 22–32)
Calcium: 8 mg/dL — ABNORMAL LOW (ref 8.9–10.3)
Chloride: 117 mmol/L — ABNORMAL HIGH (ref 98–111)
Creatinine, Ser: 1.12 mg/dL (ref 0.61–1.24)
GFR calc Af Amer: >60 mL/min (ref >60)
GFR calc non Af Amer: >60 mL/min (ref >60)
Glucose, Bld: 260 mg/dL — ABNORMAL HIGH (ref 70–99)
Phosphorus: 4.1 mg/dL (ref 2.5–4.6)
Potassium: 4.6 mmol/L (ref 3.5–5.1)
Sodium: 149 mmol/L — ABNORMAL HIGH (ref 135–145)

## 2019-09-01 LAB — CBC WITH DIFFERENTIAL/PLATELET
Abs Immature Granulocytes: 0.32 10*3/uL — ABNORMAL HIGH (ref 0.00–0.07)
Basophils Absolute: 0.1 10*3/uL (ref 0.0–0.1)
Basophils Relative: 0 %
Eosinophils Absolute: 0.3 10*3/uL (ref 0.0–0.5)
Eosinophils Relative: 1 %
HCT: 29 % — ABNORMAL LOW (ref 39.0–52.0)
Hemoglobin: 8.3 g/dL — ABNORMAL LOW (ref 13.0–17.0)
Immature Granulocytes: 1 %
Lymphocytes Relative: 5 %
Lymphs Abs: 1.1 10*3/uL (ref 0.7–4.0)
MCH: 30.3 pg (ref 26.0–34.0)
MCHC: 28.6 g/dL — ABNORMAL LOW (ref 30.0–36.0)
MCV: 105.8 fL — ABNORMAL HIGH (ref 80.0–100.0)
Monocytes Absolute: 1.3 10*3/uL — ABNORMAL HIGH (ref 0.1–1.0)
Monocytes Relative: 5 %
Neutro Abs: 21.4 10*3/uL — ABNORMAL HIGH (ref 1.7–7.7)
Neutrophils Relative %: 88 %
Platelets: 378 10*3/uL (ref 150–400)
RBC: 2.74 MIL/uL — ABNORMAL LOW (ref 4.22–5.81)
RDW: 23.7 % — ABNORMAL HIGH (ref 11.5–15.5)
WBC: 24.6 10*3/uL — ABNORMAL HIGH (ref 4.0–10.5)
nRBC: 0.4 % — ABNORMAL HIGH (ref 0.0–0.2)

## 2019-09-01 LAB — COOXEMETRY PANEL
Carboxyhemoglobin: 2.1 % — ABNORMAL HIGH (ref 0.5–1.5)
Carboxyhemoglobin: 2.5 % — ABNORMAL HIGH (ref 0.5–1.5)
Methemoglobin: 1.2 % (ref 0.0–1.5)
Methemoglobin: 1.3 % (ref 0.0–1.5)
O2 Saturation: 63.4 %
O2 Saturation: 73.9 %
Total hemoglobin: 8.8 g/dL — ABNORMAL LOW (ref 12.0–16.0)
Total hemoglobin: 6.1 g/dL — CL (ref 12.0–16.0)

## 2019-09-01 LAB — COMPREHENSIVE METABOLIC PANEL
ALT: 119 U/L — ABNORMAL HIGH (ref 0–44)
AST: 118 U/L — ABNORMAL HIGH (ref 15–41)
Albumin: 1.8 g/dL — ABNORMAL LOW (ref 3.5–5.0)
Alkaline Phosphatase: 378 U/L — ABNORMAL HIGH (ref 38–126)
Anion gap: 8 (ref 5–15)
BUN: 73 mg/dL — ABNORMAL HIGH (ref 8–23)
CO2: 24 mmol/L (ref 22–32)
Calcium: 8.1 mg/dL — ABNORMAL LOW (ref 8.9–10.3)
Chloride: 115 mmol/L — ABNORMAL HIGH (ref 98–111)
Creatinine, Ser: 1.21 mg/dL (ref 0.61–1.24)
GFR calc Af Amer: >60 mL/min (ref >60)
GFR calc non Af Amer: >60 mL/min (ref >60)
Glucose, Bld: 181 mg/dL — ABNORMAL HIGH (ref 70–99)
Potassium: 3.9 mmol/L (ref 3.5–5.1)
Sodium: 147 mmol/L — ABNORMAL HIGH (ref 135–145)
Total Bilirubin: 8 mg/dL — ABNORMAL HIGH (ref 0.3–1.2)
Total Protein: 6 g/dL — ABNORMAL LOW (ref 6.5–8.1)

## 2019-09-01 LAB — HEMOGLOBIN AND HEMATOCRIT, BLOOD
HCT: 28.3 % — ABNORMAL LOW (ref 39.0–52.0)
Hemoglobin: 7.8 g/dL — ABNORMAL LOW (ref 13.0–17.0)

## 2019-09-01 LAB — URINALYSIS, ROUTINE W REFLEX MICROSCOPIC
Bilirubin Urine: NEGATIVE
Glucose, UA: 50 mg/dL — AB
Hgb urine dipstick: NEGATIVE
Ketones, ur: NEGATIVE mg/dL
Leukocytes,Ua: NEGATIVE
Nitrite: NEGATIVE
Protein, ur: 30 mg/dL — AB
Specific Gravity, Urine: 1.018 (ref 1.005–1.030)
pH: 6 (ref 5.0–8.0)

## 2019-09-01 LAB — GLUCOSE, CAPILLARY
Glucose-Capillary: 139 mg/dL — ABNORMAL HIGH (ref 70–99)
Glucose-Capillary: 148 mg/dL — ABNORMAL HIGH (ref 70–99)
Glucose-Capillary: 163 mg/dL — ABNORMAL HIGH (ref 70–99)
Glucose-Capillary: 170 mg/dL — ABNORMAL HIGH (ref 70–99)
Glucose-Capillary: 192 mg/dL — ABNORMAL HIGH (ref 70–99)
Glucose-Capillary: 216 mg/dL — ABNORMAL HIGH (ref 70–99)
Glucose-Capillary: 232 mg/dL — ABNORMAL HIGH (ref 70–99)

## 2019-09-01 LAB — MRSA PCR SCREENING: MRSA by PCR: NEGATIVE

## 2019-09-01 LAB — C DIFFICILE (CDIFF) QUICK SCRN (NO PCR REFLEX)
C Diff antigen: NEGATIVE
C Diff interpretation: NOT DETECTED
C Diff toxin: NEGATIVE

## 2019-09-01 LAB — APTT: aPTT: 64 seconds — ABNORMAL HIGH (ref 24–36)

## 2019-09-01 MED ORDER — INSULIN DETEMIR 100 UNIT/ML ~~LOC~~ SOLN
12.0000 [IU] | Freq: Two times a day (BID) | SUBCUTANEOUS | Status: DC
  Administered 2019-09-01 – 2019-09-02 (×3): 12 [IU] via SUBCUTANEOUS
  Filled 2019-09-01 (×5): qty 0.12

## 2019-09-01 MED ORDER — POTASSIUM CHLORIDE 20 MEQ/15ML (10%) PO SOLN
20.0000 meq | Freq: Once | ORAL | Status: AC
  Administered 2019-09-01: 20 meq
  Filled 2019-09-01: qty 15

## 2019-09-01 MED ORDER — PANCRELIPASE (LIP-PROT-AMYL) 10440-39150 UNITS PO TABS
20880.0000 [IU] | ORAL_TABLET | Freq: Once | ORAL | Status: AC
  Administered 2019-09-02: 20880 [IU]
  Filled 2019-09-01: qty 2

## 2019-09-01 MED ORDER — CHLORHEXIDINE GLUCONATE CLOTH 2 % EX PADS
6.0000 | MEDICATED_PAD | Freq: Every day | CUTANEOUS | Status: DC
  Administered 2019-09-01 – 2019-10-09 (×36): 6 via TOPICAL

## 2019-09-01 MED ORDER — SODIUM BICARBONATE 650 MG PO TABS
650.0000 mg | ORAL_TABLET | Freq: Once | ORAL | Status: AC
  Administered 2019-09-01: 650 mg
  Filled 2019-09-01: qty 1

## 2019-09-01 NOTE — Progress Notes (Signed)
Patient ID: Todd [name], male   DOB: 08-Feb-1956, 64 y.o.   MRN: [id number]     Advanced Heart Failure Rounding Note  PCP-Cardiologist: No primary care provider on file.   Subjective:    08/02/19 CABG 08/03/19 VF arrest. Chest open at bedside 08/03/19 Back to OR for ECMO cannulation 08/03/19 Washout out for tamponade 08/04/19 Repeat bedside washout for tamponade 08/06/19 To OR for decannulation and Impella 5.5 08/07/19 Underwent emergent bedside chest exploration at bedside for tamponade  08/10/19 Chest closed 08/16/19 HIT positive, bivalirudin begun 08/17/19 Impella removed 08/20/19 Tracheostomy 08/23/19 HIDA scan showed patent common bile duct (unable to visualize GB, unable to rule out acalculous cholecystitis).  08/24/19 cholecystostomy tube 08/30/19 chest CT suggestive of possible empyema  Milrinone stopped yesterday. NE at 10. CO-ox 63% MAPs 70-80s  ECG with diffuse encephalopathy.  Awake on trach. Seems to close eyes to command at times but won't follow other commands. AF overnight. Appears dyspneic on vent through trach. Jaundiced. Received 2 doses of IV lasix yesterday. Weigh down 3 pounds. CVP 9    ABx/antifungals: Meropenem and [name] stopped 7/14 Zosyn stopped 7/21.   Candida from BAL and blood culture, started on Diflucan.   Now on fluconazole, vanc/meropenem (restarted 08/30/19)    Objective:   Weight Range: 68.9 kg Body mass index is 22.43 kg/m.   Vital Signs:   Temp:  [97.7 F (36.5 C)-100 F (37.8 C)] 98.5 F (36.9 C) (07/24 0300) Pulse Rate:  [72-86] 81 (07/24 0515) Resp:  [14-25] 22 (07/24 0515) BP: (116-137)/(46-81) 133/61 (07/24 0515) SpO2:  [98 %-100 %] 98 % (07/24 0515) Arterial Line BP: (102-139)/(45-61) 114/50 (07/24 0515) FiO2 (%):  [40 %] 40 % (07/24 0352) Weight:  [68.9 kg] 68.9 kg (07/24 0432) Last BM Date: 08/31/19  Weight change: Filed Weights   08/30/19 0455 08/31/19 0500 09/01/19 0432  Weight: 67.2 kg 70.3 kg 68.9 kg     Intake/Output:   Intake/Output Summary (Last 24 hours) at 09/01/2019 0526 Last data filed at 09/01/2019 0502 Gross per 24 hour  Intake 2969.94 ml  Output 2390 ml  Net 579.94 ml      Physical Exam    General:  Critically ill appearing. Awake on vent through trach,  HEENT: normal + cor-track jaundiced + scleral icterus  Neck: + trach Cor: PMI nondisplaced. Regular rate & rhythm. Wounds ok  Lungs:coarse. + CTs Abdomen: soft, nontender, nondistended. + biliary drain No hepatosplenomegaly. No bruits or masses. Good bowel sounds. Extremities: no cyanosis, clubbing, rash, 1+ edema jaundiced LUE appears flaccid  Neuro: awake Seems to close eyes to command at times but won't follow other commands.    Telemetry   NSR 70-80s.  Personally reviewed   Labs    CBC Recent Labs    08/31/19 0230 09/01/19 0431  WBC 27.0* 24.6*  NEUTROABS 23.4* PENDING  HGB 7.8* 8.3*  HCT 27.7* 29.0*  MCV 106.9* 105.8*  PLT 364 213   Basic Metabolic Panel Recent Labs    08/31/19 0230 08/31/19 0230 08/31/19 1635 09/01/19 0431  NA 149*   < > 149* 147*  K 4.4   < > 4.1 3.9  CL 116*   < > 116* 115*  CO2 23   < > 24 24  GLUCOSE 249*   < > 192* 181*  BUN 71*   < > 74* 73*  CREATININE 1.34*   < > 1.17 1.21  CALCIUM 8.3*   < > 8.1* 8.1*  MG 2.5*  --   --   --  PHOS 4.4  --  4.8*  --    < > = values in this interval not displayed.   Liver Function Tests Recent Labs    08/31/19 0230 08/31/19 0230 08/31/19 1635 09/01/19 0431  AST 94*  --   --  118*  ALT 108*  --   --  119*  ALKPHOS 338*  --   --  378*  BILITOT 7.9*  --   --  8.0*  PROT 5.8*  --   --  6.0*  ALBUMIN 1.9*   < > 1.8* 1.8*   < > = values in this interval not displayed.   No results for input(s): LIPASE, AMYLASE in the last 72 hours. Cardiac Enzymes No results for input(s): CKTOTAL, CKMB, CKMBINDEX, TROPONINI in the last 72 hours.  BNP: BNP (last 3 results) Recent Labs    07/26/19 1236  BNP 2,568.2*    ProBNP  (last 3 results) No results for input(s): PROBNP in the last 8760 hours.   D-Dimer No results for input(s): DDIMER in the last 72 hours. Hemoglobin A1C No results for input(s): HGBA1C in the last 72 hours. Fasting Lipid Panel No results for input(s): CHOL, HDL, LDLCALC, TRIG, CHOLHDL, LDLDIRECT in the last 72 hours. Thyroid Function Tests No results for input(s): TSH, T4TOTAL, T3FREE, THYROIDAB in the last 72 hours.  Invalid input(s): FREET3  Other results:   Imaging    EEG  Result Date: 08/31/2019 Lora Havens, MD     08/31/2019 12:26 PM Patient Name: ERNST CUMPSTON MRN: [id number] Epilepsy Attending: Lora Havens Referring Physician/Provider: Dr Madalyn Rob Date: 08/31/2019 Duration: 26.16 mins Patient history: 64yo M with ams. EEG to evaluate for seizure Level of alertness:  lethargic AEDs during EEG study: None Technical aspects: This EEG study was done with scalp electrodes positioned according to the 10-20 International system of electrode placement. Electrical activity was acquired at a sampling rate of _0  and reviewed with a high frequency filter of _1  and a low frequency filter of _2 . EEG data were recorded continuously and digitally stored. Description: No posterior dominant rhythm. EEG showed continuous generalized mixed frequencies with predominantly 5 to 6 Hz theta slowing with intermittent 13-_3  frontocentral beta activity as well as 2-_4  intermittent delta slowing. Hyperventilation and photic stimulation were not performed.   ABNORMALITY. -Continuous slow, generalized IMPRESSION: This study is suggestive of severe diffuse encephalopathy, nonspecific etiology but likely related to sedation, toxic-metabolic etiology. No seizures or epileptiform discharges were seen throughout the recording. Priyanka Barbra Sarks     Medications:     Scheduled Medications: . amiodarone  200 mg Per Tube BID  . aspirin  81 mg Per Tube Daily  . chlorhexidine gluconate (MEDLINE KIT)   15 mL Mouth Rinse BID  . Chlorhexidine Gluconate Cloth  6 each Topical Daily  . cholestyramine  4 g Oral BID  . collagenase   Topical Daily  . docusate  200 mg Per Tube Daily  . feeding supplement (PROSource TF)  45 mL Per Tube Daily  . free water  100 mL Per Tube Q1H  . insulin aspart  0-24 Units Subcutaneous Q4H  . insulin aspart  5 Units Subcutaneous Q4H  . insulin detemir  8 Units Subcutaneous BID  . ipratropium  0.5 mg Nebulization TID BM  . levalbuterol  1.25 mg Nebulization TID BM  . mouth rinse  15 mL Mouth Rinse 10 times per day  . midodrine  15 mg Per Tube TID WC  . nutrition supplement (  JUVEN)  1 packet Per Tube BID BM  . sodium chloride flush  3 mL Intravenous Q12H  . sodium chloride flush  5 mL Intracatheter Q8H  . sodium chloride HYPERTONIC  4 mL Nebulization BID  . thiamine injection  100 mg Intravenous Daily    Infusions: . sodium chloride 250 mL (08/13/19 1412)  . sodium chloride Stopped (08/13/19 1631)  . bivalirudin (ANGIOMAX) infusion 0.5 mg/mL (Non-ACS indications) Stopped (09/01/19 0121)  . feeding supplement (PIVOT 1.5 CAL) 1,000 mL (08/31/19 1145)  . fluconazole (DIFLUCAN) IV 400 mg (09/01/19 0230)  . lactated ringers 20 mL/hr at 08/10/19 1705  . lactated ringers 10 mL/hr at 08/18/19 0054  . meropenem (MERREM) IV Stopped (09/01/19 0052)  . norepinephrine (LEVOPHED) Adult infusion 10 mcg/min (09/01/19 0502)  . vancomycin Stopped (08/31/19 1052)    PRN Medications: sodium chloride, sodium chloride, acetaminophen (TYLENOL) oral liquid 160 mg/5 mL, dextrose, fentaNYL (SUBLIMAZE) injection, Gerhardt's butt cream, lip balm, metoprolol tartrate, ondansetron (ZOFRAN) IV, sodium chloride flush   Assessment/Plan   1. Shock: Mixed cardiogenic/septic.  Echo with EF 20-25%, moderate RV dysfunction.  VA ECMO post-VF arrest, cannulated 6/25.  Stable s/p return to OR for mediastinal re-exploration due to bleeding on 6/25.  Decannulated from [place] ECMO on 08/06/19. Impella  5.5 placed. Underwent emergent chest re-exploration on 6/29 for tamponade and clot removal. Chest closed 7/2.  Impella 5.5 removed 7/9.  Milrinone stopped 7/23  This morning, he is on NE 10  Suspect primarily septic/vasodilatory shock with fever, elevated WBCs. Co-ox 63% - Diureses well overnight CVP 9 weight down 3 pounds.  - Continue midodrine 15 mg tid and continue to wean NE as toelrated  - Will give daily 40 IV lasix today and tomorrow  2. 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  Prolonged VF arrest on 6/25. Chest closed 7/2.  - ASA  - Holding Crestor with elevated LFTs.  3. Cardiac arrest: VF arrest with prolonged code. Episodes of VT/NSVT in setting of low K (difficult to effectively replace). K now stable, minimal ectopy.  - Continue PO amio 200 mg bid.   - Eventual evaluation for ICD or lifevest at d/c - Keep K > 4.0 Mg > 2.0  - No change 4. Acute hypoxemic respiratory failure: In setting of cardiac arrest.  H/o COPD.  Bibasilar infiltrates, possible HCAP.  Now with tracheostomy. Diflucan with candida in BAL and blood culture.  More rhonchorous on exam.  - tracheal aspirate re cultured yesterday. NGTD  - Vanc + meropenum restarted 7/23 - Chest CT yesterday w/ concerns for ? Developing empyema.  - CCM following and have discussed further CT drainage with TCTS but not felt to be ar ole for that currently 5. Acute blood loss anemia: Now s/p mediastinal re-exploration x 3. Hgb 8.3 today.  - Now on bivalirudin gtt. - Monitor. 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  Platelets have recovered.  7. ID: Suspect HCAP initially, on meropenem/anidulafungin/vanco initially with ongoing fevers and leukocytosis. Venous dopplers negative for DVT. Possible drug fever from meropenem, he was switched to Zosyn 7/14 - 7/21.  He had abdominal US with gallbladder sludge, no definite cholecystitis.  HIDA scan done, GB does not opacify  => ?acalculous cholecystitis => now with cholecystostomy tube.  Most recently, Candida in blood culture and BAL, he is on Diflucan. Exam by ophthalmology => no candidal endophthalmitis.  Foley and PICC removed 7/21.  WBCs falling slowly 27 ->  25 K. AF currently. - ID following.  - C.diff negative - Currently on Diflucan. Vanc + meropenum restarted 7/22 -  tracheal aspirate re cultured yesterday. NGTD  - Chest CT w/ concerns for ? Developing empyema. CCM and TCTS following 8. Elevated bilirubin: Mostly direct = likely cholestatic/shock liver/RV failure predominantly.   Abdominal US with evidence for cirrhosis, sludge in GB but no definite acute cholecystitis. NH3 was not significantly elevated. Tbili 8.0 today (7.9yesterday). HIDA scan showed patent common bile duct, so not obstructive stone causing elevated bilirubin.  9. Ileus: Improved.  Getting TFs.  10. Atrial fibrillation/flutter: Paroxysmal. In NSR today.     - Continue PO amio, 200 mg bid  - bivalirudin gtt.  11. Neuro: CT head negative on 7/11. EEG with e/o diffuse encephalopathy.  - seems to lose eyes intermittently on command. O/w no meaningful interaction 12. Hypernatremia: Na 147 today.  - Continue free water boluses 300 cc q4 hrs.   Situation concerning. Unsure we can have meaningful outcome here for him. Continue current plan for now.   CRITICAL CARE Performed by: Glori Bickers  Total critical care time: 45 minutes  Critical care time was exclusive of separately billable procedures and treating other patients.  Critical care was necessary to treat or prevent imminent or life-threatening deterioration.  Critical care was time spent personally by me (independent of midlevel providers or residents) on the following activities: development of treatment plan with patient and/or surrogate as well as nursing, discussions with consultants, evaluation of patient's response to treatment, examination of patient, obtaining history  from patient or surrogate, ordering and performing treatments and interventions, ordering and review of laboratory studies, ordering and review of radiographic studies, pulse oximetry and re-evaluation of patient's condition.    Glori Bickers, MD 09/01/2019 5:26 AM

## 2019-09-01 NOTE — Progress Notes (Addendum)
Patient ID: Todd [name], male   DOB: 1955/12/27, 64 y.o.   MRN: [id number] TCTS DAILY ICU PROGRESS NOTE                   [address]            [place] [address]          [phone]   15 Days Post-Op Procedure(s) (LRB): REMOVAL OF IMPELLA LEFT VENTRICULAR ASSIST DEVICE (N/A) TRANSESOPHAGEAL ECHOCARDIOGRAM (TEE) (N/A)  DATE OF PROCEDURE:  08/02/2019 PREOPERATIVE DIAGNOSIS:  Severe 3-vessel coronary disease with ischemic cardiomyopathy. POSTOPERATIVE DIAGNOSIS:  Severe 3-vessel coronary disease with ischemic cardiomyopathy. PROCEDURE:   Median sternotomy, extracorporeal circulation, Coronary artery bypass grafting x 4      Left internal mammary artery to left anterior descending,      Saphenous vein graft to 1st diagonal,      Saphenous vein graft to posterior descending,      Left radial to  obtuse marginal 1 Endoscopic vein harvest right thigh. SURGEON:  Modesto Charon, MD   Total Length of Stay:  LOS: 37 days   Subjective: Patient slightly more alert this morning but does not follow commands, opens eyes and is responsive.  Has remained afebrile for the past 24 hours  Objective: Vital signs in last 24 hours: Temp:  [97.7 F (36.5 C)-98.8 F (37.1 C)] 98.5 F (36.9 C) (07/24 0300) Pulse Rate:  [72-104] 82 (07/24 0700) Cardiac Rhythm: (P) Normal sinus rhythm (07/24 0000) Resp:  [16-26] 24 (07/24 0700) BP: (108-159)/(43-112) 144/107 (07/24 0700) SpO2:  [89 %-100 %] 100 % (07/24 0700) Arterial Line BP: (102-139)/(31-59) 116/49 (07/24 0700) FiO2 (%):  [40 %] 40 % (07/24 0400) Weight:  [68.9 kg] 68.9 kg (07/24 0432)  Filed Weights   08/30/19 0455 08/31/19 0500 09/01/19 0432  Weight: 67.2 kg 70.3 kg 68.9 kg    Weight change: -1.4 kg   Hemodynamic parameters for last 24 hours: CVP:  [9 mmHg-12 mmHg] 9 mmHg  Intake/Output from previous day: 07/23 0701 - 07/24 0700 In: 2501.3 [I.V.:336.1; NG/GT:1690; IV Piggyback:475.3] Out: 3110  [Urine:2100; Drains:60; Stool:400; Chest Tube:550]  Intake/Output this shift: No intake/output data recorded.  Current Meds: Scheduled Meds: . amiodarone  200 mg Per Tube BID  . aspirin  81 mg Per Tube Daily  . chlorhexidine gluconate (MEDLINE KIT)  15 mL Mouth Rinse BID  . Chlorhexidine Gluconate Cloth  6 each Topical Daily  . cholestyramine  4 g Oral BID  . collagenase   Topical Daily  . docusate  200 mg Per Tube Daily  . feeding supplement (PROSource TF)  45 mL Per Tube Daily  . free water  100 mL Per Tube Q1H  . insulin aspart  0-24 Units Subcutaneous Q4H  . insulin aspart  5 Units Subcutaneous Q4H  . insulin detemir  8 Units Subcutaneous BID  . ipratropium  0.5 mg Nebulization TID BM  . levalbuterol  1.25 mg Nebulization TID BM  . mouth rinse  15 mL Mouth Rinse 10 times per day  . midodrine  15 mg Per Tube TID WC  . nutrition supplement (JUVEN)  1 packet Per Tube BID BM  . sodium chloride flush  3 mL Intravenous Q12H  . sodium chloride flush  5 mL Intracatheter Q8H  . sodium chloride HYPERTONIC  4 mL Nebulization BID  . thiamine injection  100 mg Intravenous Daily   Continuous Infusions: . sodium chloride 250 mL (08/13/19 1412)  . sodium chloride Stopped (  08/13/19 1631)  . bivalirudin (ANGIOMAX) infusion 0.5 mg/mL (Non-ACS indications) Stopped (09/01/19 0121)  . feeding supplement (PIVOT 1.5 CAL) 1,000 mL (08/31/19 1145)  . fluconazole (DIFLUCAN) IV 400 mg (09/01/19 0230)  . lactated ringers 20 mL/hr at 08/10/19 1705  . lactated ringers 10 mL/hr at 08/18/19 0054  . meropenem (MERREM) IV Stopped (09/01/19 0052)  . norepinephrine (LEVOPHED) Adult infusion 10 mcg/min (09/01/19 0502)  . vancomycin Stopped (08/31/19 1052)   PRN Meds:.sodium chloride, sodium chloride, acetaminophen (TYLENOL) oral liquid 160 mg/5 mL, dextrose, fentaNYL (SUBLIMAZE) injection, Todd [name]'s butt cream, lip balm, metoprolol tartrate, ondansetron (ZOFRAN) IV, sodium chloride flush  General  appearance: alert and no distress Neurologic: intact Heart: regular rate and rhythm, S1, S2 normal, no murmur, click, rub or gallop Lungs: diminished breath sounds bibasilar Abdomen: soft, non-tender; bowel sounds normal; no masses,  no organomegaly and drain in gall bladder Extremities: extremities normal, atraumatic, no cyanosis or edema and Homans sign is negative, no sign of DVT Wound: sternum stable does not appea source of infection   Lab Results: CBC: Recent Labs    08/31/19 0230 09/01/19 0431  WBC 27.0* 24.6*  HGB 7.8* 8.3*  HCT 27.7* 29.0*  PLT 364 378   BMET:  Recent Labs    08/31/19 1635 09/01/19 0431  NA 149* 147*  K 4.1 3.9  CL 116* 115*  CO2 24 24  GLUCOSE 192* 181*  BUN 74* 73*  CREATININE 1.17 1.21  CALCIUM 8.1* 8.1*    CMET: Lab Results  Component Value Date   WBC 24.6 (H) 09/01/2019   HGB 8.3 (L) 09/01/2019   HCT 29.0 (L) 09/01/2019   PLT 378 09/01/2019   GLUCOSE 181 (H) 09/01/2019   CHOL 203 (H) 07/27/2019   TRIG 213 (H) 08/27/2019   HDL 34 (L) 07/27/2019   LDLCALC 148 (H) 07/27/2019   ALT 119 (H) 09/01/2019   AST 118 (H) 09/01/2019   NA 147 (H) 09/01/2019   K 3.9 09/01/2019   CL 115 (H) 09/01/2019   CREATININE 1.21 09/01/2019   BUN 73 (H) 09/01/2019   CO2 24 09/01/2019   TSH 2.243 07/31/2019   INR 1.6 (H) 08/20/2019   HGBA1C 9.0 (H) 07/26/2019      PT/INR: No results for input(s): LABPROT, INR in the last 72 hours. Radiology: EEG  Result Date: 08/31/2019 Todd Havens, MD     08/31/2019 12:26 PM Patient Name: Todd [name] MRN: [id number] Epilepsy Attending: Lora [name] Referring Physician/Provider: Dr Madalyn Rob Date: 08/31/2019 Duration: 26.16 mins Patient history: 64yo M with ams. EEG to evaluate for seizure Level of alertness:  lethargic AEDs during EEG study: None Technical aspects: This EEG study was done with scalp electrodes positioned according to the 10-20 International system of electrode placement. Electrical  activity was acquired at a sampling rate of _0  and reviewed with a high frequency filter of _1  and a low frequency filter of _2 . EEG data were recorded continuously and digitally stored. Description: No posterior dominant rhythm. EEG showed continuous generalized mixed frequencies with predominantly 5 to 6 Hz theta slowing with intermittent 13-_3  frontocentral beta activity as well as 2-_4  intermittent delta slowing. Hyperventilation and photic stimulation were not performed.   ABNORMALITY. -Continuous slow, generalized IMPRESSION: This study is suggestive of severe diffuse encephalopathy, nonspecific etiology but likely related to sedation, toxic-metabolic etiology. No seizures or epileptiform discharges were seen throughout the recording. 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

## 2019-09-01 NOTE — Progress Notes (Signed)
Patient ID: Todd [name], male   DOB: 09/20/1955, 64 y.o.   MRN: [id number] EVENING ROUNDS NOTE :     [address]       [place] [address]             [phone]                 15 Days Post-Op Procedure(s) (LRB): REMOVAL OF IMPELLA LEFT VENTRICULAR ASSIST DEVICE (N/A) TRANSESOPHAGEAL ECHOCARDIOGRAM (TEE) (N/A)  Total Length of Stay:  LOS: 37 days  BP (!) 117/54 (BP Location: Left Arm)   Pulse 84   Temp 99.2 F (37.3 C) (Oral)   Resp (!) 26   Ht 5\' 9"  (1.753 m)   Wt 68.9 kg   SpO2 99%   BMI 22.43 kg/m   .Intake/Output      07/24 0701 - 07/25 0700   I.V. (mL/kg) 110.7 (1.6)   NG/GT 1155   IV Piggyback 450   Total Intake(mL/kg) 1715.7 (24.9)   Urine (mL/kg/hr) 950 (1.1)   Drains 40   Stool    Chest Tube 250   Total Output 1240   Net +475.7         . sodium chloride Stopped (08/13/19 1631)  . bivalirudin (ANGIOMAX) infusion 0.5 mg/mL (Non-ACS indications) 0.055 mg/kg/hr (09/01/19 1800)  . feeding supplement (PIVOT 1.5 CAL) 1,000 mL (08/31/19 1145)  . fluconazole (DIFLUCAN) IV Stopped (09/01/19 0430)  . meropenem (MERREM) IV Stopped (09/01/19 1437)  . norepinephrine (LEVOPHED) Adult infusion Stopped (09/01/19 1214)  . vancomycin Stopped (09/01/19 1055)     Lab Results  Component Value Date   WBC 24.6 (H) 09/01/2019   HGB 7.8 (L) 09/01/2019   HCT 28.3 (L) 09/01/2019   PLT 378 09/01/2019   GLUCOSE 260 (H) 09/01/2019   CHOL 203 (H) 07/27/2019   TRIG 213 (H) 08/27/2019   HDL 34 (L) 07/27/2019   LDLCALC 148 (H) 07/27/2019   ALT 119 (H) 09/01/2019   AST 118 (H) 09/01/2019   NA 149 (H) 09/01/2019   K 4.6 09/01/2019   CL 117 (H) 09/01/2019   CREATININE 1.12 09/01/2019   BUN 80 (H) 09/01/2019   CO2 26 09/01/2019   TSH 2.243 07/31/2019   INR 1.6 (H) 08/20/2019   HGBA1C 9.0 (H) 07/26/2019   Off drips Opens eyes , more alert    07/28/2019 MD  Beeper [phone] Office [phone] 09/01/2019 7:17 PM

## 2019-09-01 NOTE — Progress Notes (Signed)
NAME:  Todd [name], MRN:  [id number], DOB:  [birth year], LOS: 88 ADMISSION DATE:  07/26/2019, CONSULTATION DATE:  08/03/19 REFERRING MD:  Roxan Hockey, CHIEF COMPLAINT:  ECMO   Brief History   64 yo male admitted with CHF exacerbation, found to have multi-vessel disease requiring CABG 6/24, post op VT Arrest 6/25 and cannulated for VA ECMO.    History of present illness   Presented with worsening dyspnea 6/17 c/w CHF exacerbation.  LHC  with severe triple vessel disease.  Underwent CABG 6/24.  Vtach arrest 6/25.  Chest opened bedside and cardiac massage initiated as well as multiple cardioversions, amiodarone, bicarb etc.  Brought to OR and cannulated for VA ECMO.  PCCM consulted to assist with management  Comorbidities include DM, heavy smoking, COPD  Past Medical History  Depression Ischemic cardiomyopathy HTN HLD  Significant Hospital Events   6/24 CABG 6/25 VA cannulation 6/28 decannulated and impella placed 6/29 bedside re-exploration; s/p decannulation. Placement of impella device originally at p8 but decreased to p4 2/2 suction events overnight up to p6. Echo completed and repositioned device. Remained on considerable support with 8 epi and 46 norepi vaso 0.05. continued chest tube output with large clots noted. Heparin thru device. Replacement products ongoing. 60% 8 peep 6/30 bedside mediastinal re-exploration and clean out of hematoma with tamponade and worsening hemodynamics. Pt had large volume transfusion. rebolused with amio 2/2 nsvt episode.  Weight up 48 pounds.  Started diuresis, Lasix drip started. 7/01 iatrogenic respiratory alkalosis, vent rate decreased. 7/02 No significant issues overnight remains on pressors and Impella not tolerating tube feeds with high gastric output awaiting core track placement 7/03 started trickle TF  7/05 Swab removed, CVL and PICC placed. 7/13 Trach and BAL 7/16 Awake this AM and tracking. No follow commands. Still having fevers. Successful  ultrasound and CT guided placement of a 10.2 French cholecystostomy tube. A small amount of aspirated bile was capped and sent to the laboratory for analysis 7/17 Off sedation gtt. doing pressure support ventilation.  Beginning to follow some commands occasionally intermittently.  Very jaundiced.  Growing Candida in the blood and BAL.  Still febrile but T-max is coming down.  WBC persistently elevated; currently at 19.4 okay.  Diflucan started. Currently on milrinone GTT, amiodarone gtt., vasopressin gtt., Levophed gtt. and bivalrudin gtt. Off epi gtt. Cards planning lasix x 1 today. 7/21 Ongoing fevers, WBC increased 7/22: Persistent fevers, pan cultured with CT Chest/Head. Suspected source tracheobronchitis.   Consults:  Advanced heart failure, PCCM, TCTS  Procedures:  LUE PICC 7/5 >>7/21 R Brachial ALine 7/9 >> Chest Tubes >> L IJ CVC 7/21 >>  Significant Diagnostic Tests:  6/22 spirometry with restrictive physiology, preserved FEV1/FVC 6/18 echo: LVEF 65-99%, grade I diastolic dysfunction 3/57 US Abdomen cholelithiasis without cholecystitis. Mild wall thinking in setting of liver disease and ascites.  7/10: LE [place] negative for DVT  7/20 CT ABD w contrast >> stable position of the percutaneous cholecystostomy tube with complete decompression of the gallbladder, trace residual free fluid within the RUQ, decreased since prior, trace bilateral pleural effusions L>R, scattered areas of ground glass airspace disease within the RML, RLL 7/22: CT Chest w/o Contrast: Retrosternal fluid collection, non specific.  Bilateral pleural effusions, portions loculated.   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 Chronic microvascular angiopathy. Stable bilateral mastoid and middle ear effusions.   Micro Data:  COVID and HIV 6/17 >> Neg 7/3 respiratory >> few Candida tropicalis 7/5 blood >>  negative  7/5 respiratory >> few Candida tropicalis 7/7 urine >>  negative 7/7 blood >> negative  7/9 wound >> negative 7/12 BAL >> candida tropicalis, candida albicans 7/14 BCID >> candida parapsilosis   7/14 BCx2 >> candida parapsilosis  7/17 BCx2 >> NGTD 7/21 BCx2 >> NGTD 7/22 Respiratory Cx >>  Antimicrobials:  Vanc 6/24>>7/13 Cefuroxime 6/24 >> 6/25 merrem 6/26 >> 7/14 Anidulafungin 7/8 >> 7/14 Zosyn 7/14 >>7/21 Diflucan 7/17 >> Vanco 7/22 Mero 7/22  Interim history/subjective:   Fever curve appears to be improving. Reviewed CT chest with Dr. Servando Snare.  We have agreed that if fever and hemodynamic instability persist that the patient should have bilateral chest tubes to drain possible loculated and infected pleural effusions. Overall strength has improved.  Will follow commands occasionally.  Objective   Blood pressure (!) 117/54, pulse 84, temperature 99.1 F (37.3 C), temperature source Oral, resp. rate (!) 26, height _0  (1.753 m), weight 68.9 kg, SpO2 99 %. CVP:  [6 mmHg-12 mmHg] 6 mmHg  Vent Mode: PSV;CPAP FiO2 (%):  [40 %] 40 % Set Rate:  [20 bmp] 20 bmp Vt Set:  [420 mL] 420 mL PEEP:  [5 cmH20] 5 cmH20 Pressure Support:  [12 cmH20] 12 cmH20 Plateau Pressure:  [15 cmH20-19 cmH20] 18 cmH20   Intake/Output Summary (Last 24 hours) at 09/01/2019 1827 Last data filed at 09/01/2019 1800 Gross per 24 hour  Intake 3570.07 ml  Output 3780 ml  Net -209.93 ml   Filed Weights   08/30/19 0455 08/31/19 0500 09/01/19 0432  Weight: 67.2 kg 70.3 kg 68.9 kg   Exam General: Adult male with trach and mechanical ventilator  HEENT: MM dry, old blood noted, trach midline #8 Neuro Eyes open spontaneously, following commands but globally weak in all extremities,+ tracking CV: s1s2 RRR, SR on monitor, no m/r/g, midline sternal wound with staples, chest tubes x2 PULM: non-labored on vent, lungs bilaterally coarse, purulent secretions.  GI: soft, bsx4 active, cortrak in place  Extremities: warm/dry, no edema  Skin: no rashes or lesions on  exposed skin.  Sacral decubitus approximately 3x3 inch site, appears healthy   Resolved Hospital Problem list     Assessment & Plan:   Critically ill due to cardiogenic/hemorrhagic shock status post CABG complicated by VT arrest,  S/p VA ECMO (decanulated 6/28). S/p impella placement (6/28> 7/9) -ASA -wean vasopressors for MAP >65. Currently on Levophed and weaning this am.  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  -Continues to have tenacious secretions. Add CPT, Start hypertonic nebs.  -Continue PRVC, did not tolerate PSV yesterday  Deconditioning of Critical Illness  --will need PT/OT when appropriate  Hyperbilirubinemia due to acalculous cholecystitis Possible cirrhosis per 7/8 [place]  Status post  percutaneous GB drain  --IR assistancing with GB drain, continue until bilirubin normalizes --LFTs and bili are trending in appropriate direction  T2DM --Improving glycemic control.  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 --continue diflucan, will need 2 weeks antifungal coverage --Ophthamology has evaluated this admission --Line exchange 7/21 with Repeat Blood Cx 7/21 NGTD  Acute blood loss anemia and critical illness anemia Heparin-induced thrombocytopenia --Bivalirudin  --Hgb stable, no evidence of bleeding  Acute kidney injury --Continue supportive care, monitor UOP closely  Hypernatremia / Hyperchloremia  --Increase free water flushes to 2.4 L per day (141m/hr)  Acute encephalopathy  --CTH without acute intracranial pathology --Infectious process likely contributing to encephalopathy  --Minimize sedating medications --Delirium  prevention measures --EEG today  --Add thiamine  Sacral Decubitus  --Wound care, appears healthy  Small left adrenal mass seen on CT scan of the abdomen August 24, 2019  Fever --Broaden abx to Vanc/Mero 7/22 --Pan cultured with CT Chest/Abd/Pelvis. Suspected source tracheobronchitis/PNA. Continue broad spectrum abx until cultures resulted.   Daily Goals Checklist  Pain/Anxiety/Delirium protocol (if indicated): fentanyl as needed only VAP protocol (if indicated): bundle in place.  DVT prophylaxis: Bivalirudin per pharmacy  Nutrition Status: High nutritional risk. Continue tube feeds.  GI prophylaxis: PPI Urinary catheter: n/a Central lines: L IJ CVC, Art Glucose control: See above Mobility/therapy needs: PT for ROM Code Status: Full  Family Communication: per TCTS Disposition: ICU   LABS    PULMONARY Recent Labs  Lab 08/29/19 1440 08/30/19 0453 08/31/19 0230 08/31/19 1050 08/31/19 1635 09/01/19 0425 09/01/19 1400  PHART 7.416  --   --   --   --   --   --   PCO2ART 40.4  --   --   --   --   --   --   PO2ART 132*  --   --   --   --   --   --   HCO3 25.7  --   --   --   --   --   --   TCO2 27  --   --   --   --   --   --   O2SAT 99.0   < > 68.1 62.4 65.6 63.4 73.9   < > = values in this interval not displayed.    CBC Recent Labs  Lab 08/30/19 0939 08/30/19 2105 08/31/19 0230 09/01/19 0431 09/01/19 1600  HGB 6.9*   < > 7.8* 8.3* 7.8*  HCT 25.3*   < > 27.7* 29.0* 28.3*  WBC 27.0*  --  27.0* 24.6*  --   PLT 399  --  364 378  --    < > = values in this interval not displayed.    COAGULATION No results for input(s): INR in the last 168 hours.  CARDIAC  No results for input(s): TROPONINI in the last 168 hours. No results for input(s): PROBNP in the last 168 hours.   CHEMISTRY Recent Labs  Lab 08/26/19 0032 08/26/19 0032 08/26/19 0228 08/26/19 1727 08/27/19 0218 08/27/19 1600 08/29/19 0222 08/29/19 1440 08/29/19 1630 08/30/19 0513 08/30/19 1549  08/30/19 1549 08/31/19 0230 08/31/19 0230 08/31/19 1635 08/31/19 1635 09/01/19 0431 09/01/19 1600  NA 147*   < > 148*   < > 143   < > 150*   < > 152*   < > 148*  --  149*  --  149*  --  147* 149*  K 4.6   < > 3.8   < > 4.2   < > 4.4   < > 4.7   < > 4.5   < > 4.4   < > 4.1   < > 3.9 4.6  CL 109   < > 109   < > 109   < > 116*  --  120*   < > 117*  --  116*  --  116*  --  115* 117*  CO2 28   < > 29   < > 25   < >  25  --  24   < > 22  --  23  --  24  --  24 26  GLUCOSE 234*   < > 170*   < > 114*   < > 199*  --  218*   < > 176*  --  249*  --  192*  --  181* 260*  BUN 65*   < > 65*   < > 46*   < > 48*  --  55*   < > 64*  --  71*  --  74*  --  73* 80*  CREATININE 1.42*   < > 1.45*   < > 1.09   < > 1.16  --  1.16   < > 1.32*  --  1.34*  --  1.17  --  1.21 1.12  CALCIUM 8.3*   < > 8.2*   < > 8.1*   < > 8.6*  --  8.5*   < > 8.4*  --  8.3*  --  8.1*  --  8.1* 8.0*  MG 2.4  --  2.4  --  2.2  --  2.3  --   --   --   --   --  2.5*  --   --   --   --   --   PHOS  --   --   --    < >  --    < >  --   --  3.6  --  4.4  --  4.4  --  4.8*  --   --  4.1   < > = values in this interval not displayed.   Estimated Creatinine Clearance: 64.1 mL/min (by C-G formula based on SCr of 1.12 mg/dL).   LIVER Recent Labs  Lab 08/28/19 0234 08/28/19 1700 08/29/19 0222 08/29/19 1630 08/30/19 0513 08/30/19 0513 08/30/19 1549 08/31/19 0230 08/31/19 1635 09/01/19 0431 09/01/19 1600  AST 96*  --  103*  --  91*  --   --  94*  --  118*  --   ALT 107*  --  113*  --  106*  --   --  108*  --  119*  --   ALKPHOS 395*  --  449*  --  350*  --   --  338*  --  378*  --   BILITOT 8.7*  --  9.0*  --  7.3*  --   --  7.9*  --  8.0*  --   PROT 6.8  --  5.9*  --  5.7*  --   --  5.8*  --  6.0*  --   ALBUMIN 1.9*   < > 2.1*   < > 1.9*   < > 1.9* 1.9* 1.8* 1.8* 1.7*   < > = values in this interval not displayed.     INFECTIOUS No results for input(s): LATICACIDVEN, PROCALCITON in the last 168 hours.   ENDOCRINE CBG (last  3)  Recent Labs    09/01/19 0801 09/01/19 1110 09/01/19 1516  GLUCAP 139* 192* 232*    CRITICAL CARE Performed by: Kipp Brood   Total critical care time: 35 minutes  Critical care time was exclusive of separately billable procedures and treating other patients.  Critical care was necessary to treat or prevent imminent or life-threatening deterioration.  Critical care was time spent personally by me on the following activities: development of treatment plan with patient and/or surrogate as well as nursing,  discussions with consultants, evaluation of patient's response to treatment, examination of patient, obtaining history from patient or surrogate, ordering and performing treatments and interventions, ordering and review of laboratory studies, ordering and review of radiographic studies, pulse oximetry, re-evaluation of patient's condition and participation in multidisciplinary rounds.  Kipp Brood, MD [facility] ICU Physician [place]  Pager: [phone] Mobile: [phone] After hours: [phone].    Please see [email or web address] for pager details.

## 2019-09-01 NOTE — Progress Notes (Signed)
Pt is alert this morning, but not interactive, not following commands  PROM reveals stiff joints; will continue PROM exercises q2H with each turn  CCM rounds:  titrate levo for SBP>100 (no MAP goal) then get co ox  Pt is quite moist r/t multiple lines/tubes/drains, weeping extremities, poorly functioning external catheter CT dressings changed Old CT site repacked Choly drain flushed and dressing changed Primafit equipment changed Stapled incisions (x3) remain OTA; painted with betadine  Message at [facility] "no supine turns" This is not clear, suspect one hip or the other should be propped at all times r/t sacral wound; will alternate b/w L and R sided turns only  Lips are cracked with dried blood and sloughing skin Tongue is coated and fissured beneath (looks like a brain) Roof of mouth has thick coating Large volume secretions pooling behind tongue (tan/white, thick) q2h oral care provided per protocol with liberal application of moisturizer and carmex  1000 Delay in multiple med admins r/t questron (must give 1hr after and 4-6hr before other enteral meds) D/w Rx for optimization of timing  1200 Wife called and updated; all questions answered and facilitated speakerphone conversation with pt as she will not be able to visit today. States pt is HoH at baseline (R>L?) and concerned this is affecting neuro assessment, specifically pt's ability to follow commands. Will place sign at head of bed.   1300 Levo off  1400 CRITICAL VALUE ALERT Critical Value:  Total hemoglobin = 6.1 Date & Time Notied:  09/01/19 1432 Provider Notified: Denese Killings, MD (CCM, ordering physician) Orders Received/Actions taken: send H&H  1600 Pt more interactive on afternoon assessment; smiling, nodding yes, shaking head no. Unclear if appropriate, and still not following commands

## 2019-09-01 NOTE — Progress Notes (Signed)
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 Chest closed 7/2. CT drainage decreasing.    HIT ab sent and returned as 2.6 - positive, switched from heparin to bivalirudin. SRA positive, confirmed for HIT.  Bivalirudin is therapeutic at 64, on 0.055 mg/kg/hr. Hgb 8.3, plt 378. No s/sx of bleeding or infusion issues.    Goals of Therapy: APTT 50-85 secs  Plan: Continue bivalirudin at 0.055 mg/kg/hr Monitor daily aPTT, CBC, and for s/sx of bleeding  Sherron Monday, PharmD, BCCCP Clinical Pharmacist  Phone: [phone] 09/01/2019 7:59 AM  Please check AMION for all [facility] Pharmacy phone numbers After 10:00 PM, call Main Pharmacy [phone]

## 2019-09-01 NOTE — Progress Notes (Signed)
Subjective:  Patient still confused  Antibiotics:  Anti-infectives (From admission, onward)   Start     Dose/Rate Route Frequency Ordered Stop   08/31/19 1000  vancomycin (VANCOREADY) IVPB 1250 mg/250 mL     Discontinue     1,250 mg 166.7 mL/hr over 90 Minutes Intravenous Every 24 hours 08/30/19 1323     08/30/19 0930  meropenem (MERREM) 1 g in sodium chloride 0.9 % 100 mL IVPB     Discontinue     1 g 200 mL/hr over 30 Minutes Intravenous Every 8 hours 08/30/19 0917     08/30/19 0930  vancomycin (VANCOREADY) IVPB 1500 mg/300 mL        1,500 mg 150 mL/hr over 120 Minutes Intravenous  Once 08/30/19 0918 08/30/19 1152   08/25/19 0200  fluconazole (DIFLUCAN) IVPB 400 mg     Discontinue     400 mg 100 mL/hr over 120 Minutes Intravenous Every 24 hours 08/25/19 0146     08/22/19 1400  piperacillin-tazobactam (ZOSYN) IVPB 3.375 g  Status:  Discontinued        3.375 g 12.5 mL/hr over 240 Minutes Intravenous Every 8 hours 08/22/19 1032 08/29/19 1021   08/20/19 2200  vancomycin (VANCOREADY) IVPB 750 mg/150 mL  Status:  Discontinued        750 mg 150 mL/hr over 60 Minutes Intravenous Every 24 hours 08/19/19 1623 08/20/19 0758   08/20/19 0900  vancomycin (VANCOREADY) IVPB 750 mg/150 mL  Status:  Discontinued        750 mg 150 mL/hr over 60 Minutes Intravenous Every 24 hours 08/20/19 0758 08/21/19 0852   08/16/19 1400  vancomycin (VANCOCIN) IVPB 1000 mg/200 mL premix  Status:  Discontinued        1,000 mg 200 mL/hr over 60 Minutes Intravenous Every 24 hours 08/16/19 1108 08/19/19 1623   08/16/19 0745  anidulafungin ([name]) 100 mg in sodium chloride 0.9 % 100 mL IVPB  Status:  Discontinued       "Followed by" Linked Group Details   100 mg 78 mL/hr over 100 Minutes Intravenous Every 24 hours 08/15/19 0741 08/22/19 1032   08/15/19 0730  anidulafungin ([name]) 200 mg in sodium chloride 0.9 % 200 mL IVPB       "Followed by" Linked Group Details   200 mg 78 mL/hr over 200 Minutes  Intravenous  Once 08/15/19 0741 08/15/19 1346   08/13/19 1400  meropenem (MERREM) 1 g in sodium chloride 0.9 % 100 mL IVPB  Status:  Discontinued        1 g 200 mL/hr over 30 Minutes Intravenous Every 8 hours 08/13/19 1236 08/22/19 1032   08/13/19 1400  vancomycin (VANCOREADY) IVPB 1500 mg/300 mL  Status:  Discontinued        1,500 mg 150 mL/hr over 120 Minutes Intravenous Every 24 hours 08/13/19 1244 08/16/19 1108   08/10/19 1504  vancomycin (VANCOCIN) 1,000 mg in sodium chloride 0.9 % 1,000 mL irrigation  Status:  Discontinued          As needed 08/10/19 1505 08/10/19 1840   08/10/19 1500  vancomycin (VANCOCIN) 1,000 mg in sodium chloride 0.9 % 1,000 mL irrigation  Status:  Discontinued         Irrigation To Surgery 08/10/19 1448 08/10/19 1453   08/10/19 1500  vancomycin (VANCOCIN) 1,000 mg in sodium chloride 0.9 % 1,000 mL irrigation  Status:  Discontinued         Irrigation To Surgery 08/10/19 1454  08/10/19 1855   08/10/19 0800  vancomycin (VANCOREADY) IVPB 1750 mg/350 mL        1,750 mg 175 mL/hr over 120 Minutes Intravenous Every 36 hours 08/09/19 1426 08/11/19 2356   08/08/19 2232  vancomycin variable dose per unstable renal function (pharmacist dosing)  Status:  Discontinued         Does not apply See admin instructions 08/08/19 2232 08/09/19 1426   08/06/19 1827  vancomycin (VANCOCIN) powder  Status:  Discontinued          As needed 08/06/19 1828 08/06/19 1936   08/06/19 1503  vancomycin (VANCOCIN) 1,000 mg in sodium chloride 0.9 % 1,000 mL irrigation  Status:  Discontinued          As needed 08/06/19 1504 08/06/19 1936   08/04/19 2100  vancomycin (VANCOCIN) IVPB 1000 mg/200 mL premix  Status:  Discontinued        1,000 mg 200 mL/hr over 60 Minutes Intravenous Every 12 hours 08/04/19 0721 08/08/19 2225   08/04/19 0730  vancomycin (VANCOREADY) IVPB 2000 mg/400 mL        2,000 mg 200 mL/hr over 120 Minutes Intravenous  Once 08/04/19 0721 08/04/19 1003   08/04/19 0730  meropenem  (MERREM) 1 g in sodium chloride 0.9 % 100 mL IVPB        1 g 200 mL/hr over 30 Minutes Intravenous Every 8 hours 08/04/19 0721 08/12/19 2220   08/02/19 2030  vancomycin (VANCOCIN) IVPB 1000 mg/200 mL premix        1,000 mg 200 mL/hr over 60 Minutes Intravenous  Once 08/02/19 1516 08/02/19 2151   08/02/19 1530  cefUROXime (ZINACEF) 1.5 g in sodium chloride 0.9 % 100 mL IVPB        1.5 g 200 mL/hr over 30 Minutes Intravenous Every 12 hours 08/02/19 1516 08/04/19 0430   08/02/19 0400  vancomycin (VANCOREADY) IVPB 1250 mg/250 mL        1,250 mg 166.7 mL/hr over 90 Minutes Intravenous To Surgery 08/01/19 1422 08/02/19 0923   08/02/19 0400  cefUROXime (ZINACEF) 1.5 g in sodium chloride 0.9 % 100 mL IVPB        1.5 g 200 mL/hr over 30 Minutes Intravenous To Surgery 08/01/19 1422 08/02/19 0853   08/02/19 0400  cefUROXime (ZINACEF) 750 mg in sodium chloride 0.9 % 100 mL IVPB        750 mg 200 mL/hr over 30 Minutes Intravenous To Surgery 08/01/19 1422 08/02/19 1430   08/01/19 0400  vancomycin (VANCOREADY) IVPB 1250 mg/250 mL  Status:  Discontinued        1,250 mg 166.7 mL/hr over 90 Minutes Intravenous To Surgery 07/31/19 1034 08/01/19 1421   08/01/19 0400  cefUROXime (ZINACEF) 1.5 g in sodium chloride 0.9 % 100 mL IVPB  Status:  Discontinued        1.5 g 200 mL/hr over 30 Minutes Intravenous To Surgery 07/31/19 1034 08/01/19 1421   08/01/19 0400  cefUROXime (ZINACEF) 750 mg in sodium chloride 0.9 % 100 mL IVPB  Status:  Discontinued        750 mg 200 mL/hr over 30 Minutes Intravenous To Surgery 07/31/19 1034 08/01/19 1421      Medications: Scheduled Meds: . amiodarone  200 mg Per Tube BID  . aspirin  81 mg Per Tube Daily  . chlorhexidine gluconate (MEDLINE KIT)  15 mL Mouth Rinse BID  . Chlorhexidine Gluconate Cloth  6 each Topical Daily  . cholestyramine  4 g Oral BID  .  collagenase   Topical Daily  . docusate  200 mg Per Tube Daily  . feeding supplement (PROSource TF)  45 mL Per Tube  Daily  . free water  100 mL Per Tube Q1H  . insulin aspart  0-24 Units Subcutaneous Q4H  . insulin aspart  5 Units Subcutaneous Q4H  . insulin detemir  8 Units Subcutaneous BID  . ipratropium  0.5 mg Nebulization TID BM  . levalbuterol  1.25 mg Nebulization TID BM  . lipase/protease/amylase)  20,880 Units Per Tube Once  . mouth rinse  15 mL Mouth Rinse 10 times per day  . midodrine  15 mg Per Tube TID WC  . nutrition supplement (JUVEN)  1 packet Per Tube BID BM  . sodium chloride flush  3 mL Intravenous Q12H  . sodium chloride flush  5 mL Intracatheter Q8H  . sodium chloride HYPERTONIC  4 mL Nebulization BID  . thiamine injection  100 mg Intravenous Daily   Continuous Infusions: . sodium chloride 250 mL (08/13/19 1412)  . sodium chloride Stopped (08/13/19 1631)  . bivalirudin (ANGIOMAX) infusion 0.5 mg/mL (Non-ACS indications) 0.055 mg/kg/hr (09/01/19 0756)  . feeding supplement (PIVOT 1.5 CAL) 1,000 mL (08/31/19 1145)  . fluconazole (DIFLUCAN) IV 400 mg (09/01/19 0230)  . lactated ringers 20 mL/hr at 08/10/19 1705  . lactated ringers 10 mL/hr at 08/18/19 0054  . meropenem (MERREM) IV 1 g (09/01/19 0758)  . norepinephrine (LEVOPHED) Adult infusion 10 mcg/min (09/01/19 0502)  . vancomycin 1,250 mg (09/01/19 0925)   PRN Meds:.sodium chloride, sodium chloride, acetaminophen (TYLENOL) oral liquid 160 mg/5 mL, dextrose, fentaNYL (SUBLIMAZE) injection, Gerhardt's butt cream, lip balm, metoprolol tartrate, ondansetron (ZOFRAN) IV, sodium chloride flush    Objective: Weight change: -1.4 kg  Intake/Output Summary (Last 24 hours) at 09/01/2019 1148 Last data filed at 09/01/2019 0600 Gross per 24 hour  Intake 2482.95 ml  Output 3110 ml  Net -627.05 ml   Blood pressure (!) 144/107, pulse 82, temperature 98.5 F (36.9 C), temperature source Oral, resp. rate (!) 24, height _0  (1.753 m), weight 68.9 kg, SpO2 100 %. Temp:  [98.5 F (36.9 C)-99.6 F (37.6 C)] 98.5 F (36.9 C) (07/24  1108) Pulse Rate:  [72-104] 82 (07/24 0700) Resp:  [16-26] 24 (07/24 0700) BP: (108-159)/(43-112) 144/107 (07/24 0700) SpO2:  [89 %-100 %] 100 % (07/24 0700) Arterial Line BP: (102-137)/(31-56) 116/49 (07/24 0700) FiO2 (%):  [40 %] 40 % (07/24 0750) Weight:  [68.9 kg] 68.9 kg (07/24 0432)  Physical Exam: General:confused HEENT: icteric sclera, EOMI CVS regular rate, no murmurs gallops rubs Chest: , Rhonchorous Abdomen: Biliary drain with bilious material Extremities: Jaundiced  Biliary drain in place central lines in place Neuro: He opens his eyes to his name and looks at me.  CBC:    BMET Recent Labs    08/31/19 1635 09/01/19 0431  NA 149* 147*  K 4.1 3.9  CL 116* 115*  CO2 24 24  GLUCOSE 192* 181*  BUN 74* 73*  CREATININE 1.17 1.21  CALCIUM 8.1* 8.1*     Liver Panel  Recent Labs    08/31/19 0230 08/31/19 0230 08/31/19 1635 09/01/19 0431  PROT 5.8*  --   --  6.0*  ALBUMIN 1.9*   < > 1.8* 1.8*  AST 94*  --   --  118*  ALT 108*  --   --  119*  ALKPHOS 338*  --   --  378*  BILITOT 7.9*  --   --  8.0*   < > = values in this interval not displayed.       Sedimentation Rate No results for input(s): ESRSEDRATE in the last 72 hours. C-Reactive Protein No results for input(s): CRP in the last 72 hours.  Micro Results: Recent Results (from the past 720 hour(s))  Culture, respiratory (non-expectorated)     Status: None   Collection Time: 08/11/19  3:32 PM   Specimen: Tracheal Aspirate; Respiratory  Result Value Ref Range Status   Specimen Description TRACHEAL ASPIRATE  Final   Special Requests Normal  Final   Gram Stain   Final    NO WBC SEEN NO SQUAMOUS EPITHELIAL CELLS SEEN RARE BUDDING YEAST SEEN Performed at [facility], [address] [place], [place] [address]    Culture FEW CANDIDA TROPICALIS  Final   Report Status 08/13/2019 FINAL  Final  Culture, blood (Routine X 2) w Reflex to ID Panel     Status: None   Collection Time:  08/13/19  8:27 AM   Specimen: BLOOD  Result Value Ref Range Status   Specimen Description BLOOD BLOOD LEFT ARM  Final   Special Requests   Final    BOTTLES DRAWN AEROBIC AND ANAEROBIC Blood Culture adequate volume   Culture   Final    NO GROWTH 5 DAYS Performed at [facility], [place] [address], [place], [place] [address]    Report Status 08/18/2019 FINAL  Final  Culture, blood (Routine X 2) w Reflex to ID Panel     Status: None   Collection Time: 08/13/19  8:31 AM   Specimen: BLOOD  Result Value Ref Range Status   Specimen Description BLOOD BLOOD LEFT HAND  Final   Special Requests   Final    BOTTLES DRAWN AEROBIC AND ANAEROBIC Blood Culture adequate volume   Culture   Final    NO GROWTH 5 DAYS Performed at [facility], [place] [address] [place], [place] [address]    Report Status 08/18/2019 FINAL  Final  Culture, respiratory (non-expectorated)     Status: None   Collection Time: 08/13/19  4:12 PM   Specimen: Tracheal Aspirate; Respiratory  Result Value Ref Range Status   Specimen Description TRACHEAL ASPIRATE  Final   Special Requests NONE  Final   Gram Stain   Final    NO WBC SEEN FEW YEAST Performed at [facility], [place] [address], [place], [place] [address]    Culture FEW CANDIDA TROPICALIS  Final   Report Status 08/16/2019 FINAL  Final  Culture, Urine     Status: None   Collection Time: 08/15/19  6:13 PM   Specimen: Urine, Catheterized  Result Value Ref Range Status   Specimen Description URINE, CATHETERIZED  Final   Special Requests NONE  Final   Culture   Final    NO GROWTH Performed at [facility], [address], [place], [place] [address]    Report Status 08/17/2019 FINAL  Final  Culture, blood (routine x 2)     Status: None   Collection Time: 08/15/19  7:53 PM   Specimen: BLOOD RIGHT HAND  Result Value Ref Range Status   Specimen Description BLOOD RIGHT HAND  Final   Special Requests   Final    BOTTLES DRAWN AEROBIC AND ANAEROBIC  Blood Culture adequate volume   Culture   Final    NO GROWTH 5 DAYS Performed at [facility], [place] [address], [place], [place]  [address]    Report Status 08/20/2019 FINAL  Final  Culture, blood (routine x 2)     Status: None   Collection Time: 08/15/19  7:53 PM   Specimen: BLOOD RIGHT HAND  Result Value Ref Range Status   Specimen Description BLOOD RIGHT HAND  Final   Special Requests   Final    BOTTLES DRAWN AEROBIC AND ANAEROBIC Blood Culture adequate volume   Culture   Final    NO GROWTH 5 DAYS Performed at [facility], [place] [address], [place], [place] [address]    Report Status 08/20/2019 FINAL  Final  Aerobic/Anaerobic Culture (surgical/deep wound)     Status: None   Collection Time: 08/17/19  2:32 PM   Specimen: Wound  Result Value Ref Range Status   Specimen Description WOUND  Final   Special Requests NONE  Final   Gram Stain NO WBC SEEN NO ORGANISMS SEEN   Final   Culture   Final    No growth aerobically or anaerobically. Performed at [facility], [place] [address] [place], [place] [address]    Report Status 08/22/2019 FINAL  Final  Culture, fungus without smear     Status: Abnormal (Preliminary result)   Collection Time: 08/20/19  2:00 PM   Specimen: Bronchoalveolar Lavage; Other  Result Value Ref Range Status   Specimen Description BRONCHIAL ALVEOLAR LAVAGE  Final   Special Requests   Final    Normal Performed at [facility], [place] [address], [place], [place] [address]    Culture CANDIDA ALBICANS (A)  Final   Report Status PENDING  Incomplete  Culture, bal-quantitative     Status: Abnormal   Collection Time: 08/20/19  2:00 PM   Specimen: Bronchoalveolar Lavage; Respiratory  Result Value Ref Range Status   Specimen Description BRONCHIAL ALVEOLAR LAVAGE  Final   Special Requests NONE  Final   Gram Stain   Final    FEW WBC PRESENT, PREDOMINANTLY PMN NO ORGANISMS SEEN Performed at [facility], [address] [place],  [place] [address]    Culture (A)  Final    4,000 COLONIES/mL CANDIDA TROPICALIS 3,000 COLONIES/mL CANDIDA ALBICANS    Report Status 08/25/2019 FINAL  Final  Body fluid culture     Status: None   Collection Time: 08/22/19  9:14 AM   Specimen: Pleura; Body Fluid  Result Value Ref Range Status   Specimen Description PLEURAL  Final   Special Requests NONE  Final   Gram Stain   Final    NO WBC SEEN NO ORGANISMS SEEN Performed at [facility], [address] [place], [place] [address]    Culture RARE CANDIDA PARAPSILOSIS  Final   Report Status 08/25/2019 FINAL  Final  Culture, blood (routine x 2)     Status: Abnormal   Collection Time: 08/22/19 12:58 PM   Specimen: BLOOD RIGHT HAND  Result Value Ref Range Status   Specimen Description BLOOD RIGHT HAND  Final   Special Requests   Final    BOTTLES DRAWN AEROBIC ONLY Blood Culture adequate volume   Culture  Setup Time   Final    AEROBIC BOTTLE ONLY YEAST CRITICAL RESULT CALLED TO, READ BACK BY AND VERIFIED WITH: Karsten Ro [facility] 08/25/19 0123 JDW Performed at [facility], [place] [address] [place], [place] [address]    Culture CANDIDA PARAPSILOSIS (A)  Final   Report Status 08/26/2019 FINAL  Final  Culture, blood (routine x 2)  Status: None   Collection Time: 08/22/19 12:58 PM   Specimen: BLOOD RIGHT HAND  Result Value Ref Range Status   Specimen Description BLOOD RIGHT HAND  Final   Special Requests   Final    BOTTLES DRAWN AEROBIC ONLY Blood Culture adequate volume   Culture   Final    NO GROWTH 5 DAYS Performed at [facility], [address] [place], [place] [address]    Report Status 08/27/2019 FINAL  Final  Blood Culture ID Panel (Reflexed)     Status: Abnormal   Collection Time: 08/22/19 12:58 PM  Result Value Ref Range Status   Enterococcus species NOT DETECTED NOT DETECTED Final   Listeria monocytogenes NOT DETECTED NOT DETECTED Final   Staphylococcus species NOT DETECTED NOT DETECTED Final    Staphylococcus aureus (BCID) NOT DETECTED NOT DETECTED Final   Streptococcus species NOT DETECTED NOT DETECTED Final   Streptococcus agalactiae NOT DETECTED NOT DETECTED Final   Streptococcus pneumoniae NOT DETECTED NOT DETECTED Final   Streptococcus pyogenes NOT DETECTED NOT DETECTED Final   Acinetobacter baumannii NOT DETECTED NOT DETECTED Final   Enterobacteriaceae species NOT DETECTED NOT DETECTED Final   Enterobacter cloacae complex NOT DETECTED NOT DETECTED Final   Escherichia coli NOT DETECTED NOT DETECTED Final   Klebsiella oxytoca NOT DETECTED NOT DETECTED Final   Klebsiella pneumoniae NOT DETECTED NOT DETECTED Final   Proteus species NOT DETECTED NOT DETECTED Final   Serratia marcescens NOT DETECTED NOT DETECTED Final   Haemophilus influenzae NOT DETECTED NOT DETECTED Final   Neisseria meningitidis NOT DETECTED NOT DETECTED Final   Pseudomonas aeruginosa NOT DETECTED NOT DETECTED Final   Candida albicans NOT DETECTED NOT DETECTED Final   Candida glabrata NOT DETECTED NOT DETECTED Final   Candida krusei NOT DETECTED NOT DETECTED Final   Candida parapsilosis DETECTED (A) NOT DETECTED Final    Comment: CRITICAL RESULT CALLED TO, READ BACK BY AND VERIFIED WITH: J LEDFORD PHARMD 08/25/19 0123 JDW    Candida tropicalis NOT DETECTED NOT DETECTED Final    Comment: Performed at [facility], [address] [place], [place] [address]  Aerobic/Anaerobic Culture (surgical/deep wound)     Status: None   Collection Time: 08/24/19  1:35 PM   Specimen: Abscess  Result Value Ref Range Status   Specimen Description ABSCESS  Final   Special Requests DRAIN  Final   Gram Stain   Final    RARE WBC PRESENT, PREDOMINANTLY PMN NO ORGANISMS SEEN    Culture   Final    No growth aerobically or anaerobically. Performed at [facility], [place] [address] [place], [place] [address]    Report Status 08/29/2019 FINAL  Final  Culture, blood (routine x 2)     Status: None   Collection Time:  08/25/19  3:53 PM   Specimen: BLOOD RIGHT HAND  Result Value Ref Range Status   Specimen Description BLOOD RIGHT HAND  Final   Special Requests   Final    BOTTLES DRAWN AEROBIC AND ANAEROBIC Blood Culture adequate volume   Culture   Final    NO GROWTH 5 DAYS Performed at [facility], [place] [address], [place], [place] [address]    Report Status 08/30/2019 FINAL  Final  Culture, blood (routine x 2)     Status: None   Collection Time: 08/25/19  3:53 PM   Specimen: BLOOD RIGHT ARM  Result Value Ref Range Status   Specimen Description BLOOD RIGHT ARM  Final  Special Requests   Final    BOTTLES DRAWN AEROBIC ONLY Blood Culture adequate volume   Culture   Final    NO GROWTH 5 DAYS Performed at [facility], [place] [address] [place], [place] [address]    Report Status 08/30/2019 FINAL  Final  Culture, blood (routine x 2)     Status: None (Preliminary result)   Collection Time: 08/29/19  9:28 AM   Specimen: BLOOD RIGHT HAND  Result Value Ref Range Status   Specimen Description BLOOD RIGHT HAND  Final   Special Requests   Final    BOTTLES DRAWN AEROBIC ONLY Blood Culture adequate volume   Culture   Final    NO GROWTH 3 DAYS Performed at [facility], [place] [address], [place], [place] [address]    Report Status PENDING  Incomplete  Culture, blood (Routine X 2) w Reflex to ID Panel     Status: None (Preliminary result)   Collection Time: 08/29/19  4:00 PM   Specimen: BLOOD  Result Value Ref Range Status   Specimen Description BLOOD CENTRAL LINE  Final   Special Requests   Final    BOTTLES DRAWN AEROBIC AND ANAEROBIC Blood Culture adequate volume   Culture   Final    NO GROWTH 3 DAYS Performed at [facility], [place] [address], [place], [place] [address]    Report Status PENDING  Incomplete  Culture, respiratory (non-expectorated)     Status: None (Preliminary result)   Collection Time: 08/30/19  8:34 AM   Specimen: Tracheal Aspirate; Respiratory  Result  Value Ref Range Status   Specimen Description TRACHEAL ASPIRATE  Final   Special Requests NONE  Final   Gram Stain   Final    RARE WBC PRESENT,BOTH PMN AND MONONUCLEAR NO ORGANISMS SEEN    Culture   Final    CULTURE REINCUBATED FOR BETTER GROWTH Performed at [facility], [place] [address], [place], [place] [address]    Report Status PENDING  Incomplete  C Difficile Quick Screen (NO PCR Reflex)     Status: None   Collection Time: 09/01/19 12:26 AM   Specimen: STOOL  Result Value Ref Range Status   C Diff antigen NEGATIVE NEGATIVE Final   C Diff toxin NEGATIVE NEGATIVE Final   C Diff interpretation No C. difficile detected.  Final    Comment: Performed at [facility], [place] [address], [place], [place] [address]  MRSA PCR Screening     Status: None   Collection Time: 09/01/19  4:07 AM   Specimen: Nasal Mucosa; Nasopharyngeal  Result Value Ref Range Status   MRSA by PCR NEGATIVE NEGATIVE Final    Comment:        The GeneXpert MRSA Assay (FDA approved for NASAL specimens only), is one component of a comprehensive MRSA colonization surveillance program. It is not intended to diagnose MRSA infection nor to guide or monitor treatment for MRSA infections. Performed at [facility], [place] [address] [place], [place] [address]     Studies/Results: EEG  Result Date: 08/31/2019 Lora Havens, MD     08/31/2019 12:26 PM Patient Name: KEISHON CHAVARIN MRN: [id number] Epilepsy Attending: Lora Havens Referring Physician/Provider: Dr Madalyn Rob Date: 08/31/2019 Duration: 26.16 mins Patient history: 64yo M with ams. EEG to evaluate for seizure Level of alertness:  lethargic AEDs during EEG study: None Technical aspects: This EEG study was done with scalp electrodes positioned according to the  10-20 International system of electrode placement. Electrical activity was acquired at a sampling rate of _0  and reviewed with a high frequency filter of _1  and a low frequency  filter of _2 . EEG data were recorded continuously and digitally stored. Description: No posterior dominant rhythm. EEG showed continuous generalized mixed frequencies with predominantly 5 to 6 Hz theta slowing with intermittent 13-_3  frontocentral beta activity as well as 2-_4  intermittent delta slowing. Hyperventilation and photic stimulation were not performed.   ABNORMALITY. -Continuous slow, generalized IMPRESSION: This study is suggestive of severe diffuse encephalopathy, nonspecific etiology but likely related to sedation, toxic-metabolic etiology. No seizures or epileptiform discharges were seen throughout the recording. Lora Havens   CT HEAD WO CONTRAST  Result Date: 08/30/2019 CLINICAL DATA:  Encephalopathy, altered mental status EXAM: CT HEAD WITHOUT CONTRAST TECHNIQUE: Contiguous axial images were obtained from the base of the skull through the vertex without intravenous contrast. 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 Unchanged appearance from prior. No evidence of acute infarction, hemorrhage, hydrocephalus, extra-axial collection or mass lesion/mass effect. Symmetric prominence of the ventricles, cisterns and sulci compatible with parenchymal volume loss. Patchy areas of white matter hypoattenuation are most compatible with chronic microvascular angiopathy. Vascular: Atherosclerotic calcification of the carotid siphons and intradural vertebral arteries. No hyperdense vessel. Skull: Image quality degraded by motion artifact. No significant swelling or calvarial fracture. No worrisome osseous lesions. Sinuses/Orbits: Paranasal sinuses predominantly clear with a nasoenteric tube in place. Bilateral mastoid and middle ear effusions are noted. Included orbital structures are unremarkable. Other: Edentulous. IMPRESSION: 1. Image quality degraded by motion artifact. 2. No definite acute  intracranial abnormality. 3. 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 4. Chronic microvascular angiopathy and parenchymal volume loss. 5. Bilateral mastoid and middle ear effusions. Similar finding on prior. Electronically Signed   By: Lovena Le M.D.   On: 08/30/2019 15:36   CT CHEST WO CONTRAST  Result Date: 08/30/2019 CLINICAL DATA:  Chest radiograph 08/30/2019, CT abdomen pelvis 08/28/2019 EXAM: CT CHEST WITHOUT CONTRAST TECHNIQUE: Multidetector CT imaging of the chest was performed following the standard protocol without IV contrast. COMPARISON:  Chest radiograph 08/30/2019, CT abdomen and pelvis 08/28/2019 FINDINGS: Cardiovascular: Cardiomegaly. Small volume pericardial fluid. Hypoattenuation of the cardiac blood pool relative to the myocardium compatible with anemia. Evidence of prior sternotomy and CABG as well as postsurgical changes from recent LVAD removal. Atherosclerotic plaque within the normal caliber aorta. Plaque noted throughout the proximal great vessels as well. Mild central pulmonary artery enlargement, could reflect some pulmonary artery hypertension. Left IJ approach central venous catheter tip terminates in the lower SVC. Mediastinum/Nodes: Postsurgical changes from recent sternotomy with a small retrosternal fluid collection, nonspecific in the setting of recent operative intervention, measuring 3.7 x 3.1 cm in size (3/91). No mediastinal gas. Additional stranding in the anterior mediastinum likely related to operative intervention as well. Evidence of prior CABG. Tracheostomy tube is in place, tip terminating in the mid trachea 5 cm from the carina. Small amount of fluid above the tracheostomy balloon. A transesophageal tube is noted with the tip terminating beyond the gastric antrum, incompletely visualized. Scattered low-attenuation subcentimeter nodes throughout the mediastinum are likely reactive. Hilar adenopathy difficult to assess in the  absence of contrast media. No worrisome axillary lymph nodes. Thyroid gland and thoracic inlet are free of acute abnormality. Lungs/Pleura: Bilateral pleural effusions despite the presence of bilateral apically directed chest tubes which  remain in position. Portions of these collections appear somewhat loculated with extension into the fissures. Associated passive atelectasis is noted with additional areas of mixed heterogeneous and ground-glass opacity within the lungs as well as more heterogeneous attenuation of the atelectatic lung itself which could suggest some underlying infection. Findings are on a background diffuse airways thickening and scattered secretions as well as moderate to severe paraseptal predominant emphysematous changes throughout the lungs. No visible residual pneumothorax. Upper Abdomen: Percutaneous cholecystostomy tube in place. Small volume ascites in the upper abdomen. Stable mild bilateral perinephric stranding. No acute abnormalities present in the visualized portions of the upper abdomen. Lobular thickening of the adrenal glands similar to prior. Musculoskeletal: Multilevel degenerative changes are present in the imaged portions of the spine. Recent sternotomy. Diffuse body wall edema. Postsurgical changes in the soft tissues of the right axillary region may reflect recent cutdown or access attempt. IMPRESSION: 1. Postsurgical changes sternotomy a likely related to recent LVAD removal. Retrosternal fluid collection, nonspecific given the operative history. Infection cannot be fully excluded. 2. Bilateral pleural effusions despite the presence of bilateral apically directed chest tubes which remain in position. Portions of these collections appear somewhat loculated with extension into the fissures concerning for developing empyema. With heterogeneity within the areas of dependent and passive atelectasis and additional areas of mixed consolidative and ground-glass airspace disease  suggesting superimposed infection, inflammation, or given airways thickening and secretions, sequela of aspiration. 3. Tracheostomy tube is in place, tip terminating in the mid trachea 5 cm from the carina. Small amount of fluid above the tracheostomy balloon. 4. Small volume ascites in the upper abdomen. 5. Percutaneous cholecystostomy tube in place. 6. Diffuse body wall edema. 7. Cardiomegaly, evidence of prior CABG. Extensive native coronary artery calcification. 8. Likely anemia. 9. Aortic Atherosclerosis (ICD10-I70.0). Electronically Signed   By: Lovena Le M.D.   On: 08/30/2019 15:48   DG Chest Port 1 View  Result Date: 08/31/2019 CLINICAL DATA:  Tracheostomy.  Chest tubes. EXAM: PORTABLE CHEST 1 VIEW COMPARISON:  CT 08/30/2019. FINDINGS: Tracheostomy tube, feeding tube, left IJ line, bilateral chest tubes in stable position. Prior CABG. Stable cardiomegaly. Diffuse bilateral pulmonary infiltrates/edema slightly progressed from prior exam. Small bilateral pleural effusions. No pneumothorax. IMPRESSION: 1. Lines and tubes including bilateral chest tubes in stable position. No pneumothorax. 2. Prior CABG. Cardiomegaly again noted. Diffuse bilateral pulmonary infiltrates/edema slightly progressed from prior exam. Small bilateral pleural effusions. Electronically Signed   By: Marcello Moores  Register   On: 08/31/2019 08:19      Assessment/Plan:  INTERVAL HISTORY: C. difficile study test negative   Principal Problem:   Candidemia ([place]) Active Problems:   CHF (congestive heart failure) (HCC)   Chronic pain syndrome   Elective surgery   Mixed hyperlipidemia   Moderate recurrent major depression (HCC)   Hypothyroidism   Acute systolic heart failure (HCC)   Type 2 diabetes mellitus with hyperglycemia (HCC)   AKI (acute kidney injury) ([place])   Coronary artery disease involving native coronary artery of native heart with unstable angina pectoris ([place])   Coronary artery disease   Malnutrition of  moderate degree   Pressure injury of skin   Acute on chronic respiratory failure (HCC)   Acute respiratory failure (HCC)   S/P CABG (coronary artery bypass graft)   Cholecystitis   ARDS (adult respiratory distress syndrome) (HCC)   Cardiogenic shock (HCC)   FUO (fever of unknown origin)    AMAN BONET is a 64 y.o. male with  complicated hospitalization  following CABG that required VA ECMO>>Impella support following several arrests and open chest procedures at the bedside. He has been on long term carbapenem up until recently when we switched him to zosyn and had gallbladder drain placed for rising bilirubinemia/cholangitis --> no obstructing stones on HIDA, ?Acalculous cholecystitis.   No growth from aspirated bile from perc-chole drain  He was found to have candida parapsilosis in blood  We had thought that he had defervesced on Zosyn but he had been on a cooling blanket.  When it was removed he started having fevers again they seem to have improved after initiation of vancomycin and meropenem.  I suspect it is the former antibiotic making a difference given how long he has been on broad-spectrum Carbapenem coverage   #1 candidemia:  Continue fluconazole  When able to discontinue central lines he should have a "line holiday with peripheral IV access and repeat blood cultures.  He should then have 2 weeks of antifungal therapy (which is currently not possible)  I appreciate ophthalmology seeing the patient.  Unfortunately their exam was compromised the patient's mental status and inability coordinate with them  #2 possible acalculous cholecystitis I doubt he has any active infection in the biliary tree at this point in time  #3  Fevers: It turns out that his fevers defervesced seeing on Zosyn likely defervesced due to the presence of a cooling blanket.  He is having more secretions today which have been cultured but so far unrevealing  CT scan that showed bilateral effusions that  may be coming loculated.  I wonder if the vancomycin is covering a pathogen in his effusion.  I am skeptical that the Carbapenem is making a difference given his protracted exposure to it  I would like to get rid of the Carbapenem sometime soon to reduce his exposure to such broad-spectrum antibiotics I worry about him developing multidrug-resistant gram-negative organisms  #4 diarrhea fortunately his C. difficile testing was negative     LOS: 37 days   Alcide Evener 09/01/2019, 11:48 AM

## 2019-09-02 ENCOUNTER — Inpatient Hospital Stay (HOSPITAL_COMMUNITY): Payer: Medicare Other

## 2019-09-02 DIAGNOSIS — J9622 Acute and chronic respiratory failure with hypercapnia: Secondary | ICD-10-CM | POA: Diagnosis not present

## 2019-09-02 DIAGNOSIS — J9621 Acute and chronic respiratory failure with hypoxia: Secondary | ICD-10-CM | POA: Diagnosis not present

## 2019-09-02 DIAGNOSIS — B377 Candidal sepsis: Secondary | ICD-10-CM | POA: Diagnosis not present

## 2019-09-02 LAB — GLUCOSE, CAPILLARY
Glucose-Capillary: 70 mg/dL (ref 70–99)
Glucose-Capillary: 80 mg/dL (ref 70–99)
Glucose-Capillary: 75 mg/dL (ref 70–99)
Glucose-Capillary: 167 mg/dL — ABNORMAL HIGH (ref 70–99)
Glucose-Capillary: 125 mg/dL — ABNORMAL HIGH (ref 70–99)
Glucose-Capillary: 82 mg/dL (ref 70–99)
Glucose-Capillary: 69 mg/dL — ABNORMAL LOW (ref 70–99)

## 2019-09-02 LAB — CBC WITH DIFFERENTIAL/PLATELET
Abs Immature Granulocytes: 0.22 10*3/uL — ABNORMAL HIGH (ref 0.00–0.07)
Basophils Absolute: 0.1 10*3/uL (ref 0.0–0.1)
Basophils Relative: 0 %
Eosinophils Absolute: 0.3 10*3/uL (ref 0.0–0.5)
Eosinophils Relative: 2 %
HCT: 27.6 % — ABNORMAL LOW (ref 39.0–52.0)
Hemoglobin: 7.9 g/dL — ABNORMAL LOW (ref 13.0–17.0)
Immature Granulocytes: 1 %
Lymphocytes Relative: 5 %
Lymphs Abs: 1 10*3/uL (ref 0.7–4.0)
MCH: 31 pg (ref 26.0–34.0)
MCHC: 28.6 g/dL — ABNORMAL LOW (ref 30.0–36.0)
MCV: 108.2 fL — ABNORMAL HIGH (ref 80.0–100.0)
Monocytes Absolute: 1.1 10*3/uL — ABNORMAL HIGH (ref 0.1–1.0)
Monocytes Relative: 6 %
Neutro Abs: 16.5 10*3/uL — ABNORMAL HIGH (ref 1.7–7.7)
Neutrophils Relative %: 86 %
Platelets: 358 10*3/uL (ref 150–400)
RBC: 2.55 MIL/uL — ABNORMAL LOW (ref 4.22–5.81)
RDW: 23.5 % — ABNORMAL HIGH (ref 11.5–15.5)
WBC: 19.2 10*3/uL — ABNORMAL HIGH (ref 4.0–10.5)
nRBC: 0.4 % — ABNORMAL HIGH (ref 0.0–0.2)

## 2019-09-02 LAB — RENAL FUNCTION PANEL
Albumin: 1.6 g/dL — ABNORMAL LOW (ref 3.5–5.0)
Anion gap: 8 (ref 5–15)
BUN: 84 mg/dL — ABNORMAL HIGH (ref 8–23)
CO2: 23 mmol/L (ref 22–32)
Calcium: 7.9 mg/dL — ABNORMAL LOW (ref 8.9–10.3)
Chloride: 115 mmol/L — ABNORMAL HIGH (ref 98–111)
Creatinine, Ser: 1.21 mg/dL (ref 0.61–1.24)
GFR calc Af Amer: >60 mL/min (ref >60)
GFR calc non Af Amer: >60 mL/min (ref >60)
Glucose, Bld: 104 mg/dL — ABNORMAL HIGH (ref 70–99)
Phosphorus: 4.3 mg/dL (ref 2.5–4.6)
Potassium: 4 mmol/L (ref 3.5–5.1)
Sodium: 146 mmol/L — ABNORMAL HIGH (ref 135–145)

## 2019-09-02 LAB — COMPREHENSIVE METABOLIC PANEL
ALT: 125 U/L — ABNORMAL HIGH (ref 0–44)
AST: 125 U/L — ABNORMAL HIGH (ref 15–41)
Albumin: 1.6 g/dL — ABNORMAL LOW (ref 3.5–5.0)
Alkaline Phosphatase: 393 U/L — ABNORMAL HIGH (ref 38–126)
Anion gap: 7 (ref 5–15)
BUN: 76 mg/dL — ABNORMAL HIGH (ref 8–23)
CO2: 24 mmol/L (ref 22–32)
Calcium: 8 mg/dL — ABNORMAL LOW (ref 8.9–10.3)
Chloride: 116 mmol/L — ABNORMAL HIGH (ref 98–111)
Creatinine, Ser: 1.03 mg/dL (ref 0.61–1.24)
GFR calc Af Amer: >60 mL/min (ref >60)
GFR calc non Af Amer: >60 mL/min (ref >60)
Glucose, Bld: 81 mg/dL (ref 70–99)
Potassium: 4 mmol/L (ref 3.5–5.1)
Sodium: 147 mmol/L — ABNORMAL HIGH (ref 135–145)
Total Bilirubin: 7.5 mg/dL — ABNORMAL HIGH (ref 0.3–1.2)
Total Protein: 5.4 g/dL — ABNORMAL LOW (ref 6.5–8.1)

## 2019-09-02 LAB — CULTURE, RESPIRATORY W GRAM STAIN

## 2019-09-02 LAB — APTT: aPTT: 57 seconds — ABNORMAL HIGH (ref 24–36)

## 2019-09-02 LAB — COOXEMETRY PANEL
Carboxyhemoglobin: 1.8 % — ABNORMAL HIGH (ref 0.5–1.5)
Methemoglobin: 0.8 % (ref 0.0–1.5)
O2 Saturation: 67.1 %
Total hemoglobin: 7.9 g/dL — ABNORMAL LOW (ref 12.0–16.0)

## 2019-09-02 MED ORDER — INSULIN ASPART 100 UNIT/ML ~~LOC~~ SOLN: 3.0000 [IU] | SUBCUTANEOUS | Status: DC

## 2019-09-02 MED ORDER — PANCRELIPASE (LIP-PROT-AMYL) 10440-39150 UNITS PO TABS
20880.0000 [IU] | ORAL_TABLET | Freq: Once | ORAL | Status: AC
  Administered 2019-09-02: 20880 [IU]
  Filled 2019-09-02: qty 2

## 2019-09-02 MED ORDER — SODIUM BICARBONATE 650 MG PO TABS
650.0000 mg | ORAL_TABLET | Freq: Once | ORAL | Status: AC
  Administered 2019-09-02: 650 mg
  Filled 2019-09-02: qty 1

## 2019-09-02 MED ORDER — FUROSEMIDE 10 MG/ML IJ SOLN
80.0000 mg | Freq: Once | INTRAMUSCULAR | Status: AC
  Administered 2019-09-02: 80 mg via INTRAVENOUS
  Filled 2019-09-02: qty 8

## 2019-09-02 MED ORDER — DEXTROSE 50 % IV SOLN
12.5000 g | Freq: Once | INTRAVENOUS | Status: AC
  Administered 2019-09-02: 12.5 g via INTRAVENOUS

## 2019-09-02 MED ORDER — FUROSEMIDE 10 MG/ML IJ SOLN
80.0000 mg | Freq: Two times a day (BID) | INTRAMUSCULAR | Status: DC
  Administered 2019-09-02 – 2019-09-03 (×2): 80 mg via INTRAVENOUS
  Filled 2019-09-02 (×2): qty 8

## 2019-09-02 MED ORDER — INSULIN ASPART 100 UNIT/ML ~~LOC~~ SOLN
3.0000 [IU] | SUBCUTANEOUS | Status: DC
  Administered 2019-09-02 (×2): 3 [IU] via SUBCUTANEOUS

## 2019-09-02 MED ORDER — POTASSIUM CHLORIDE CRYS ER 20 MEQ PO TBCR
40.0000 meq | EXTENDED_RELEASE_TABLET | Freq: Two times a day (BID) | ORAL | Status: DC
  Filled 2019-09-02: qty 2

## 2019-09-02 NOTE — Progress Notes (Signed)
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    Assessment: 64 yo male s/p ECMO and Impella. Chest closed 7/2. CT drainage decreasing.    HIT ab sent and returned as 2.6 - positive, switched from heparin to bivalirudin. SRA positive, confirmed for HIT.  Bivalirudin is therapeutic at 57, on 0.055 mg/kg/hr. Hgb down slightly to 7.9, plt 358. No s/sx of bleeding or infusion issues.    Goals of Therapy: APTT 50-85 secs  Plan: Continue bivalirudin at 0.055 mg/kg/hr Monitor daily aPTT, CBC, and for s/sx of bleeding  Sherron Monday, PharmD, BCCCP Clinical Pharmacist  Phone: [phone] 09/02/2019 7:26 AM  Please check AMION for all [facility] Pharmacy phone numbers After 10:00 PM, call Main Pharmacy [phone]

## 2019-09-02 NOTE — Progress Notes (Signed)
NAME:  Todd [name], MRN:  [id number], DOB:  1955-07-10, LOS: 2 ADMISSION DATE:  07/26/2019, CONSULTATION DATE:  08/03/19 REFERRING MD:  Roxan Hockey, CHIEF COMPLAINT:  ECMO   Brief History   64 yo male admitted with CHF exacerbation, found to have multi-vessel disease requiring CABG 6/24, post op VT Arrest 6/25 and cannulated for VA ECMO.    History of present illness   Presented with worsening dyspnea 6/17 c/w CHF exacerbation.  LHC  with severe triple vessel disease.  Underwent CABG 6/24.  Vtach arrest 6/25.  Chest opened bedside and cardiac massage initiated as well as multiple cardioversions, amiodarone, bicarb etc.  Brought to OR and cannulated for VA ECMO.  PCCM consulted to assist with management  Comorbidities include DM, heavy smoking, COPD  Past Medical History  Depression Ischemic cardiomyopathy HTN HLD  Significant Hospital Events   6/24 CABG 6/25 VA cannulation 6/28 decannulated and impella placed 6/29 bedside re-exploration; s/p decannulation. Placement of impella device originally at p8 but decreased to p4 2/2 suction events overnight up to p6. Echo completed and repositioned device. Remained on considerable support with 8 epi and 46 norepi vaso 0.05. continued chest tube output with large clots noted. Heparin thru device. Replacement products ongoing. 60% 8 peep 6/30 bedside mediastinal re-exploration and clean out of hematoma with tamponade and worsening hemodynamics. Pt had large volume transfusion. rebolused with amio 2/2 nsvt episode.  Weight up 48 pounds.  Started diuresis, Lasix drip started. 7/01 iatrogenic respiratory alkalosis, vent rate decreased. 7/02 No significant issues overnight remains on pressors and Impella not tolerating tube feeds with high gastric output awaiting core track placement 7/03 started trickle TF  7/05 Swab removed, CVL and PICC placed. 7/13 Trach and BAL 7/16 Awake this AM and tracking. No follow commands. Still having fevers. Successful  ultrasound and CT guided placement of a 10.2 French cholecystostomy tube. A small amount of aspirated bile was capped and sent to the laboratory for analysis 7/17 Off sedation gtt. doing pressure support ventilation.  Beginning to follow some commands occasionally intermittently.  Very jaundiced.  Growing Candida in the blood and BAL.  Still febrile but T-max is coming down.  WBC persistently elevated; currently at 19.4 okay.  Diflucan started. Currently on milrinone GTT, amiodarone gtt., vasopressin gtt., Levophed gtt. and bivalrudin gtt. Off epi gtt. Cards planning lasix x 1 today. 7/21 Ongoing fevers, WBC increased 7/22: Persistent fevers, pan cultured with CT Chest/Head. Suspected source tracheobronchitis.   Consults:  Advanced heart failure, PCCM, TCTS  Procedures:  LUE PICC 7/5 >>7/21 R Brachial ALine 7/9 >> Chest Tubes >> L IJ CVC 7/21 >>  Significant Diagnostic Tests:  6/22 spirometry with restrictive physiology, preserved FEV1/FVC 6/18 echo: LVEF 40-34%, grade I diastolic dysfunction 7/42 US Abdomen cholelithiasis without cholecystitis. Mild wall thinking in setting of liver disease and ascites.  7/10: LE [place] negative for DVT  7/20 CT ABD w contrast >> stable position of the percutaneous cholecystostomy tube with complete decompression of the gallbladder, trace residual free fluid within the RUQ, decreased since prior, trace bilateral pleural effusions L>R, scattered areas of ground glass airspace disease within the RML, RLL 7/22: CT Chest w/o Contrast: Retrosternal fluid collection, non specific.  Bilateral pleural effusions, portions loculated.   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 Chronic microvascular angiopathy. Stable bilateral mastoid and middle ear effusions.   Micro Data:  COVID and HIV 6/17 >> Neg 7/3 respiratory >> few Candida tropicalis 7/5 blood >>  negative  7/5 respiratory >> few Candida tropicalis 7/7 urine >>  negative 7/7 blood >> negative  7/9 wound >> negative 7/12 BAL >> candida tropicalis, candida albicans 7/14 BCID >> candida parapsilosis   7/14 BCx2 >> candida parapsilosis  7/17 BCx2 >> NGTD 7/21 BCx2 >> NGTD 7/22 Respiratory Cx >>  Antimicrobials:  Vanc 6/24>>7/13 Cefuroxime 6/24 >> 6/25 merrem 6/26 >> 7/14 Anidulafungin 7/8 >> 7/14 Zosyn 7/14 >>7/21 Diflucan 7/17 >> Vanco 7/22 Mero 7/22  Interim history/subjective:   Has remained afebrile. Off pressors. More awake.   Objective   Blood pressure (!) 116/47, pulse 92, temperature 99 F (37.2 C), temperature source Oral, resp. rate 22, height _0  (1.753 m), weight 72 kg, SpO2 99 %. CVP:  [4 mmHg-20 mmHg] 6 mmHg  Vent Mode: PRVC FiO2 (%):  [40 %] 40 % Set Rate:  [20 bmp] 20 bmp Vt Set:  [420 mL] 420 mL PEEP:  [5 cmH20] 5 cmH20 Pressure Support:  [12 cmH20] 12 cmH20 Plateau Pressure:  [15 cmH20-19 cmH20] 18 cmH20   Intake/Output Summary (Last 24 hours) at 09/02/2019 1404 Last data filed at 09/02/2019 1000 Gross per 24 hour  Intake 1691.77 ml  Output 2170 ml  Net -478.23 ml   Filed Weights   08/31/19 0500 09/01/19 0432 09/02/19 0458  Weight: 70.3 kg 68.9 kg 72 kg   Exam General: Adult male with trach and mechanical ventilator  HEENT: MM dry, old blood noted, trach midline #8 Neuro Eyes open spontaneously, following commands but globally weak in all extremities,+ tracking CV: s1s2 RRR, SR on monitor, no m/r/g, midline sternal wound with staples, chest tubes x2 PULM: continues to have increased work of breathing. Chest clear anteriorly.  GI: soft, bsx4 active, cortrak in place  Extremities: warm/dry, no edema  Skin: no rashes or lesions on exposed skin.  Sacral decubitus approximately 3x3 inch site, appears healthy   Resolved Hospital Problem list     Assessment & Plan:   Critically ill due to cardiogenic/hemorrhagic shock status post CABG complicated by VT arrest,  S/p VA ECMO (decanulated 6/28). S/p impella  placement (6/28> 7/9) Vasopressors are off. - Remove central line and arterial line.  - As BP improves, can consider introducing GDHFT.  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  -Continues to have tenacious secretions. Add CPT, Start hypertonic nebs.  -Continue PRVC, did not tolerate PSV yesterday - suspect he might need long-term ventilation.   Deconditioning of Critical Illness  --will need PT/OT when appropriate  Hyperbilirubinemia due to acalculous cholecystitis Possible cirrhosis per 7/8 [place]  Status post  percutaneous GB drain  --IR assistancing with GB drain, continue until bilirubin normalizes --LFTs and bili are trending in appropriate direction  T2DM --Improving glycemic control.  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 --continue diflucan, will need 2 weeks antifungal coverage --Ophthamology has evaluated this admission --Line exchange 7/21 with Repeat Blood Cx 7/21 NGTD  Acute blood loss anemia and critical illness anemia Heparin-induced thrombocytopenia --Bivalirudin  --Hgb stable, no evidence of bleeding  Acute kidney injury --Continue supportive care, monitor UOP closely  Hypernatremia / Hyperchloremia  --Increase free water flushes to 2.4 L per day (168m/hr)  Acute encephalopathy  --CTH without acute intracranial pathology --Infectious  process likely contributing to encephalopathy  --Minimize sedating medications --Delirium prevention measures --EEG today  --Add thiamine  Sacral Decubitus  --Wound care, appears healthy  Small left adrenal mass seen on CT scan of the abdomen August 24, 2019  Fever --Broaden abx to Vanc/Mero 7/22 --Pan  cultured with CT Chest/Abd/Pelvis. Suspected source tracheobronchitis/PNA.  -- Consider bilateral drainage of possible loculated effusions if fevers recur.   Daily Goals Checklist  Pain/Anxiety/Delirium protocol (if indicated): fentanyl as needed only VAP protocol (if indicated): bundle in place.  DVT prophylaxis: Bivalirudin per pharmacy  Nutrition Status: High nutritional risk. Continue tube feeds.  For IR PEG GI prophylaxis: PPI Urinary catheter: n/a Central lines: L IJ CVC, Art Glucose control: See above Mobility/therapy needs: PT for ROM Code Status: Full  Family Communication: family updated 7/23.  Disposition: ICU, Anticipate LTACH need.    LABS    PULMONARY Recent Labs  Lab 08/29/19 1440 08/30/19 0453 08/31/19 1050 08/31/19 1635 09/01/19 0425 09/01/19 1400 09/02/19 0234  PHART 7.416  --   --   --   --   --   --   PCO2ART 40.4  --   --   --   --   --   --   PO2ART 132*  --   --   --   --   --   --   HCO3 25.7  --   --   --   --   --   --   TCO2 27  --   --   --   --   --   --   O2SAT 99.0   < > 62.4 65.6 63.4 73.9 67.1   < > = values in this interval not displayed.    CBC Recent Labs  Lab 08/31/19 0230 08/31/19 0230 09/01/19 0431 09/01/19 1600 09/02/19 0248  HGB 7.8*   < > 8.3* 7.8* 7.9*  HCT 27.7*   < > 29.0* 28.3* 27.6*  WBC 27.0*  --  24.6*  --  19.2*  PLT 364  --  378  --  358   < > = values in this interval not displayed.    COAGULATION No results for input(s): INR in the last 168 hours.  CARDIAC  No results for input(s): TROPONINI in the last 168 hours. No results for input(s): PROBNP in the last 168 hours.   CHEMISTRY Recent Labs  Lab 08/27/19 0218 08/27/19 1600 08/29/19 0222 08/29/19 1440 08/29/19 1630 08/30/19 0513 08/30/19 1549 08/30/19 1549 08/31/19 0230 08/31/19 0230 08/31/19 1635 08/31/19 1635 09/01/19 0431 09/01/19 0431 09/01/19 1600 09/02/19 0249  NA 143   < > 150*   < > 152*   < > 148*   < > 149*  --  149*  --   147*  --  149* 147*  K 4.2   < > 4.4   < > 4.7   < > 4.5   < > 4.4   < > 4.1   < > 3.9   < > 4.6 4.0  CL 109   < > 116*  --  120*   < > 117*   < > 116*  --  116*  --  115*  --  117* 116*  CO2 25   < > 25  --  24   < > 22   < > 23  --  24  --  24  --  26 24  GLUCOSE 114*   < > 199*  --  218*   < > 176*   < > 249*  --  192*  --  181*  --  260* 81  BUN 46*   < > 48*  --  55*   < > 64*   < > 71*  --  74*  --  73*  --  80* 76*  CREATININE 1.09   < > 1.16  --  1.16   < > 1.32*   < > 1.34*  --  1.17  --  1.21  --  1.12 1.03  CALCIUM 8.1*   < > 8.6*  --  8.5*   < > 8.4*   < > 8.3*  --  8.1*  --  8.1*  --  8.0* 8.0*  MG 2.2  --  2.3  --   --   --   --   --  2.5*  --   --   --   --   --   --   --   PHOS  --    < >  --   --  3.6  --  4.4  --  4.4  --  4.8*  --   --   --  4.1  --    < > = values in this interval not displayed.   Estimated Creatinine Clearance: 71.5 mL/min (by C-G formula based on SCr of 1.03 mg/dL).   LIVER Recent Labs  Lab 08/29/19 0222 08/29/19 1630 08/30/19 0513 08/30/19 1549 08/31/19 0230 08/31/19 1635 09/01/19 0431 09/01/19 1600 09/02/19 0249  AST 103*  --  91*  --  94*  --  118*  --  125*  ALT 113*  --  106*  --  108*  --  119*  --  125*  ALKPHOS 449*  --  350*  --  338*  --  378*  --  393*  BILITOT 9.0*  --  7.3*  --  7.9*  --  8.0*  --  7.5*  PROT 5.9*  --  5.7*  --  5.8*  --  6.0*  --  5.4*  ALBUMIN 2.1*   < > 1.9*   < > 1.9* 1.8* 1.8* 1.7* 1.6*   < > = values in this interval not displayed.     INFECTIOUS No results for input(s): LATICACIDVEN, PROCALCITON in the last 168 hours.   ENDOCRINE CBG (last 3)  Recent Labs    09/02/19 0315 09/02/19 0719 09/02/19 1123  GLUCAP 80 75 167*    CRITICAL CARE Performed by: Kipp Brood   Total critical care time: 35 minutes  Critical care time was exclusive of separately billable procedures and treating other patients.  Critical care was necessary to treat or prevent imminent or life-threatening  deterioration.  Critical care was time spent personally by me on the following activities: development of treatment plan with patient and/or surrogate as well as nursing, discussions with consultants, evaluation of patient's response to treatment, examination of patient, obtaining history from patient or surrogate, ordering and performing treatments and interventions, ordering and review of laboratory studies, ordering and review of radiographic studies, pulse oximetry, re-evaluation of patient's condition and participation in multidisciplinary rounds.  Kipp Brood, MD [facility] ICU Physician [place]  Pager: [phone] Mobile: [phone] After hours: [phone].    Please see [email or web address] for pager details.

## 2019-09-02 NOTE — Progress Notes (Signed)
Logan RN aware of order to d/c CVC

## 2019-09-02 NOTE — Progress Notes (Signed)
Patient ID: Todd [name], male   DOB: 07-14-1955, 64 y.o.   MRN: [id number] EVENING ROUNDS NOTE :     [address]       [place] [address]             [phone]                 16 Days Post-Op Procedure(s) (LRB): REMOVAL OF IMPELLA LEFT VENTRICULAR ASSIST DEVICE (N/A) TRANSESOPHAGEAL ECHOCARDIOGRAM (TEE) (N/A)  Total Length of Stay:  LOS: 38 days  BP 121/65   Pulse 76   Temp 99.5 F (37.5 C) (Axillary)   Resp 22   Ht 5\' 9"  (1.753 m)   Wt 72 kg   SpO2 97%   BMI 23.44 kg/m   .Intake/Output      07/24 0701 - 07/25 0700 07/25 0701 - 07/26 0700   I.V. (mL/kg) 212.7 (3) 23.7 (0.3)   NG/GT 1320 835   IV Piggyback 665.9 24.1   Total Intake(mL/kg) 2198.6 (30.5) 882.7 (12.3)   Urine (mL/kg/hr) 1850 (1.1) 750 (0.9)   Drains 40 900   Stool  550   Chest Tube 630    Total Output 2520 2200   Net -321.4 -1317.3        Stool Occurrence  1 x     . sodium chloride Stopped (08/13/19 1631)  . bivalirudin (ANGIOMAX) infusion 0.5 mg/mL (Non-ACS indications) 0.055 mg/kg/hr (09/02/19 1000)  . feeding supplement (PIVOT 1.5 CAL) 1,000 mL (08/31/19 1145)  . fluconazole (DIFLUCAN) IV Stopped (09/02/19 0302)  . meropenem (MERREM) IV 1 g (09/02/19 1341)  . vancomycin 166.7 mL/hr at 09/02/19 1000     Lab Results  Component Value Date   WBC 19.2 (H) 09/02/2019   HGB 7.9 (L) 09/02/2019   HCT 27.6 (L) 09/02/2019   PLT 358 09/02/2019   GLUCOSE 81 09/02/2019   CHOL 203 (H) 07/27/2019   TRIG 213 (H) 08/27/2019   HDL 34 (L) 07/27/2019   LDLCALC 148 (H) 07/27/2019   ALT 125 (H) 09/02/2019   AST 125 (H) 09/02/2019   NA 147 (H) 09/02/2019   K 4.0 09/02/2019   CL 116 (H) 09/02/2019   CREATININE 1.03 09/02/2019   BUN 76 (H) 09/02/2019   CO2 24 09/02/2019   TSH 2.243 07/31/2019   INR 1.6 (H) 08/20/2019   HGBA1C 9.0 (H) 07/26/2019   Stable day Opens eyes not following commands   07/28/2019 MD  Beeper [phone] Office [phone] 09/02/2019 6:48 PM

## 2019-09-02 NOTE — Evaluation (Signed)
Physical Therapy Evaluation Patient Details Name: Todd [name] MRN: [id number] DOB: December 03, 1955 Today's Date: 09/02/2019   History of Present Illness  Todd [name] is a 64 y.o. male presenting with shortness of breath; noted AKI, CHF, recent PNA, elevated troponin, pulm edema/effusion, and cardiomegaly. PMH is significant for HFrEF, COPD, HTN, T2DM, HLD, MDD, chronic back pain, vitamin deficiency, BPH, tobacco use disorder, marijuana use disorder. CABG 6/24 and intubated (extubated 6/25)--went into cardiac arrest on same date with re-opening of chest for heart massage and direct epicardial paddles and place on ECMO. 6/25 and 266/25/21 washout out for tamponade. 7/2 sternal closure and wound vac. 7/9 Impella removed.08/20/19 Tracheostomy 08/24/19 cholecystostomy tube  Clinical Impression  PTA pt was independent using a quad cane for ambulation. Pt is currently on ventilator and requires total Ax2 for all bed mobility. PT recommending LTACH for continued PT services at discharge. PT will continue to follow acutely.    Follow Up Recommendations LTACH    Equipment Recommendations  None recommended by PT    Recommendations for Other Services       Precautions / Restrictions Precautions Precautions: Fall;Sternal Precaution Booklet Issued: No Precaution Comments: vent, flexi-seal, male pure wick, drain bag, left chest tube Restrictions Weight Bearing Restrictions: No      Mobility  Bed Mobility Overal bed mobility: Needs Assistance Bed Mobility: Supine to Sit;Sit to Supine     Supine to sit: Total assist;+2 for physical assistance Sit to supine: Total assist;+2 for physical assistance   General bed mobility comments: not assessed, patient sitting edge of bed  Transfers                 General transfer comment: unable at this time due to not helping with sitting balance as of yet         Balance Overall balance assessment: Needs assistance Sitting-balance support: No upper  extremity supported;Feet supported Sitting balance-Leahy Scale: Zero Sitting balance - Comments: no righting reaction Postural control: Posterior lean                                   Pertinent Vitals/Pain Pain Assessment: Faces Faces Pain Scale: No hurt    Home Living Family/patient expects to be discharged to::  [facility]) Living Arrangements: Spouse/significant other Available Help at Discharge: Family;Available 24 hours/day Type of Home: Mobile home Home Access: Level entry   Entrance Stairs-Number of Steps: one small step up Home Layout: One level Home Equipment: Gilmer Mor - quad;Walker - 2 wheels;Cane - single point Additional Comments: All information taken from prior chart one month ago due to pt on vent/trach collar    Prior Function Level of Independence: Independent with assistive device(s)         Comments: uses QC at baseline     Hand Dominance   Dominant Hand: Right    Extremity/Trunk Assessment   Upper Extremity Assessment Upper Extremity Assessment: Defer to OT evaluation RUE Deficits / Details: cogwheel feel with PROM at elbow;he shakes head yes to do you feel my hand in yours RUE Coordination: decreased fine motor;decreased gross motor LUE Deficits / Details: edematous and weeping; he shakes head yes to do you feel my hand in yours LUE Coordination: decreased gross motor;decreased fine motor    Lower Extremity Assessment Lower Extremity Assessment: RLE deficits/detail;LLE deficits/detail RLE Deficits / Details: no purposeful movement, resistance/tone with PROM  RLE Sensation:  (nods yes to feeling ) RLE Coordination: decreased  fine motor;decreased gross motor LLE Deficits / Details: no purposeful movement LLE Sensation:  (nods yes to feeling ) LLE Coordination: decreased fine motor;decreased gross motor    Cervical / Trunk Assessment Cervical / Trunk Assessment: Normal  Communication   Communication: HOH  Cognition  Arousal/Alertness: Awake/alert Behavior During Therapy: WFL for tasks assessed/performed Overall Cognitive Status: Impaired/Different from baseline Area of Impairment: Following commands;Safety/judgement;Awareness;Problem solving                       Following Commands: Follows one step commands inconsistently Safety/Judgement: Decreased awareness of safety;Decreased awareness of deficits Awareness: Intellectual Problem Solving: Slow processing;Decreased initiation;Difficulty sequencing;Requires verbal cues;Requires tactile cues General Comments: All questions were answered with a "yes' head nod (did not test a question that should have been a "no " head nod), moved his head and eyes left when asked (sometimes with a delay) but did not maintain, moved his head slighlty upward while seated EOB when asked to look up at therapist in front of him. No movements to command of all 4 extremities however he did spontaneously use RUE to try to reposition himself in bed once layed back down from sitting      General Comments  VSS         Assessment/Plan    PT Assessment Patient needs continued PT services  PT Problem List Decreased strength;Decreased range of motion;Decreased activity tolerance;Decreased balance;Decreased mobility;Decreased coordination;Cardiopulmonary status limiting activity       PT Treatment Interventions Functional mobility training;Therapeutic activities;Therapeutic exercise;Balance training;Neuromuscular re-education;Patient/family education;Cognitive remediation    PT Goals (Current goals can be found in the Care Plan section)  Acute Rehab PT Goals Patient Stated Goal: pt unable to state (on vent) PT Goal Formulation: With patient/family Time For Goal Achievement: 08/07/19 Potential to Achieve Goals: Good    Frequency Min 2X/week   Barriers to discharge Decreased caregiver support      Co-evaluation PT/OT/SLP Co-Evaluation/Treatment: Yes Reason for  Co-Treatment: Complexity of the patient's impairments (multi-system involvement) PT goals addressed during session: Mobility/safety with mobility OT goals addressed during session: Strengthening/ROM       AM-PAC PT "6 Clicks" Mobility  Outcome Measure Help needed turning from your back to your side while in a flat bed without using bedrails?: None Help needed moving from lying on your back to sitting on the side of a flat bed without using bedrails?: None Help needed moving to and from a bed to a chair (including a wheelchair)?: A Little Help needed standing up from a chair using your arms (e.g., wheelchair or bedside chair)?: A Little Help needed to walk in hospital room?: A Little Help needed climbing 3-5 steps with a railing? : A Little 6 Click Score: 20    End of Session Equipment Utilized During Treatment: Gait belt Activity Tolerance: Patient tolerated treatment well Patient left: with call bell/phone within reach;with family/visitor present;Other (comment);with nursing/sitter in room (seated on edge of bed) Nurse Communication: Mobility status PT Visit Diagnosis: Unsteadiness on feet (R26.81);Muscle weakness (generalized) (M62.81);Difficulty in walking, not elsewhere classified (R26.2)    Time: 6759-1638 PT Time Calculation (min) (ACUTE ONLY): 28 min   Charges:   PT Evaluation $PT Eval High Complexity: 1 High          Gionni Vaca B. Beverely Risen PT, DPT Acute Rehabilitation Services Pager [phone] Office [phone]   Elon Alas Fleet 09/02/2019, 1:51 PM

## 2019-09-02 NOTE — Evaluation (Signed)
Occupational Therapy Evaluation Patient Details Name: Todd [name] MRN: [id number] DOB: 1955-05-30 Today's Date: 09/02/2019    History of Present Illness Todd [name] is a 64 y.o. male presenting with shortness of breath; noted AKI, CHF, recent PNA, elevated troponin, pulm edema/effusion, and cardiomegaly. PMH is significant for HFrEF, COPD, HTN, T2DM, HLD, MDD, chronic back pain, vitamin deficiency, BPH, tobacco use disorder, marijuana use disorder. CABG 6/24 and intubated (extubated 6/25)--went into cardiac arrest on same date with re-opening of chest for heart massage and direct epicardial paddles and place on ECMO. 6/25 and 266/25/21 washout out for tamponade. 7/2 sternal closure and wound vac. 7/9 Impella removed.08/20/19 Tracheostomy 08/24/19 cholecystostomy tube   Clinical Impression   This 64 yo male admitted with above presents to acute OT with PLOF of independent pta. Currently pt is total A for all basic ADLs and total A +2 for all mobility. He will continue to benefit from acute OT with follow up at [facility]. Vitals stable throughout session.     Follow Up Recommendations  LTACH;Supervision/Assistance - 24 hour    Equipment Recommendations  Other (comment) (TBD at next venue)       Precautions / Restrictions Precautions Precautions: Fall;Sternal Precaution Booklet Issued: No Precaution Comments: vent, flexi-seal, male pure wick, drain bag, left chest tube Restrictions Weight Bearing Restrictions: No      Mobility Bed Mobility Overal bed mobility: Needs Assistance Bed Mobility: Supine to Sit;Sit to Supine     Supine to sit: Total assist;+2 for physical assistance Sit to supine: Total assist;+2 for physical assistance      Transfers                 General transfer comment: unable at this time due to not helping with sitting balance as of yet    Balance Overall balance assessment: Needs assistance Sitting-balance support: No upper extremity supported;Feet  supported Sitting balance-Leahy Scale: Zero Sitting balance - Comments: no righting reaction Postural control: Posterior lean                                 ADL either performed or assessed with clinical judgement   ADL Overall ADL's : Needs assistance/impaired                                       General ADL Comments: total A     Vision   Additional Comments: Pt with tendency for head and eyes to be to the right; he can look left and turn head when asked (sometimes with a delay), does not maintain to left            Pertinent Vitals/Pain Pain Assessment: Faces Faces Pain Scale: No hurt     Hand Dominance Right   Extremity/Trunk Assessment Upper Extremity Assessment Upper Extremity Assessment: RUE deficits/detail;LUE deficits/detail RUE Deficits / Details: cogwheel feel with PROM at elbow;he shakes head yes to do you feel my hand in yours RUE Coordination: decreased fine motor;decreased gross motor LUE Deficits / Details: edematous and weeping; he shakes head yes to do you feel my hand in yours LUE Coordination: decreased gross motor;decreased fine motor           Communication Communication Communication: HOH   Cognition Arousal/Alertness: Awake/alert Behavior During Therapy: WFL for tasks assessed/performed Overall Cognitive Status: Impaired/Different from baseline Area of Impairment: Following commands;Safety/judgement;Awareness;Problem  solving                       Following Commands: Follows one step commands inconsistently Safety/Judgement: Decreased awareness of safety;Decreased awareness of deficits Awareness: Intellectual Problem Solving: Slow processing;Decreased initiation;Difficulty sequencing;Requires verbal cues;Requires tactile cues General Comments: All questions were answered with a "yes' head nod (did not test a question that should have been a "no " head nod), moved his head and eyes left when asked  (sometimes with a delay) but did not maintain, moved his head slighlty upward while seated EOB when asked to look up at therapist in front of him. No movements to command of all 4 extremities however he did spontaneously use RUE to try to reposition himself in bed once layed back down from sitting              Home Living Family/patient expects to be discharged to::  [facility]) Living Arrangements: Spouse/significant other Available Help at Discharge: Family;Available 24 hours/day Type of Home: Mobile home Home Access: Level entry Entrance Stairs-Number of Steps: one small step up   Home Layout: One level     Bathroom Shower/Tub: Chief Strategy Officer: Standard     Home Equipment: IT sales professional - 2 wheels;Cane - single point   Additional Comments: All information taken from prior chart one month ago due to pt on vent/trach collar      Prior Functioning/Environment Level of Independence: Independent with assistive device(s)        Comments: uses QC at baseline        OT Problem List: Decreased strength;Decreased range of motion;Impaired balance (sitting and/or standing);Impaired vision/perception;Decreased coordination;Decreased cognition;Decreased safety awareness;Decreased knowledge of precautions;Impaired tone;Impaired UE functional use;Increased edema      OT Treatment/Interventions: Self-care/ADL training;Therapeutic exercise;Therapeutic activities;DME and/or AE instruction;Patient/family education;Balance training;Visual/perceptual remediation/compensation;Cognitive remediation/compensation    OT Goals(Current goals can be found in the care plan section) Acute Rehab OT Goals Patient Stated Goal: pt unable to state (on vent) OT Goal Formulation: Patient unable to participate in goal setting Time For Goal Achievement: 09/16/19 Potential to Achieve Goals: Fair  OT Frequency: Min 2X/week           Co-evaluation PT/OT/SLP Co-Evaluation/Treatment:  Yes Reason for Co-Treatment: Complexity of the patient's impairments (multi-system involvement);For patient/therapist safety PT goals addressed during session: Mobility/safety with mobility;Balance;Strengthening/ROM OT goals addressed during session: Strengthening/ROM      AM-PAC OT "6 Clicks" Daily Activity     Outcome Measure Help from another person eating meals?: Total Help from another person taking care of personal grooming?: Total Help from another person toileting, which includes using toliet, bedpan, or urinal?: Total Help from another person bathing (including washing, rinsing, drying)?: Total Help from another person to put on and taking off regular upper body clothing?: Total Help from another person to put on and taking off regular lower body clothing?: Total 6 Click Score: 6   End of Session Equipment Utilized During Treatment: Oxygen (vent FiO2 40) Nurse Communication: Mobility status (command following)  Activity Tolerance: Patient tolerated treatment well Patient left: in bed;with call bell/phone within reach;with bed alarm set  OT Visit Diagnosis: Other abnormalities of gait and mobility (R26.89);Muscle weakness (generalized) (M62.81);Other symptoms and signs involving cognitive function                Time: 1610-9604 OT Time Calculation (min): 37 min Charges:  OT General Charges $OT Visit: 1 Visit OT Evaluation $OT Eval Moderate Complexity: 1 Mod  Ignacia Palma, OTR/L Acute Rehab Services Pager [phone] Office [phone]     Evette Georges 09/02/2019, 1:13 PM

## 2019-09-02 NOTE — Progress Notes (Signed)
Pharmacy Antibiotic Note  Todd [name] is a 64 y.o. male admitted on 07/26/2019 with CHF exacerbation. 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 VT arrest on 6/25 and underwent [facility] ECMO. Decannulated on 6/28 and Impella placed. Chest closed on 7/2 and Imella removed 7/9. Cholecystostomy tube placed on 7/16.  Was on antibiotics initially for possible HCAP. No definite cholecystitis was seen on abd [place]. Now with candida in blood cultures and BAL. No growth from aspirated bile from perc-chole drain. PICC line and CL changed 7/21.  Pharmacy has been consulted for fluconazole for candidemiia and add back Vancomycin and Meropenem.   WBC trending downward from 24 to 19. C diff neg. Afeb - off cooling blanket. Scr 1.03 (CrCl 71 mL/min). Remains on meropenem, vancomycin, and fluconazole.  Plan: Vancomycin12107m IV q24h - plan for level on 7/26 Meropenem 1gm IV q8h Fluconazole 400 mg IV q24h  Follow cultures, renal function, needs fundoscopic exam  Height: _0  (175.3 cm) Weight: 72 kg (158 lb 11.7 oz) IBW/kg (Calculated) : 70.7  Temp (24hrs), Avg:98.8 F (37.1 C), Min:98.5 F (36.9 C), Max:99.2 F (37.3 C)  Recent Labs  Lab 08/30/19 0453 08/30/19 0513 08/30/19 0939 08/30/19 1549 08/31/19 0230 08/31/19 1635 09/01/19 0431 09/01/19 1600 09/02/19 0248 09/02/19 0249  WBC 25.7*  --  27.0*  --  27.0*  --  24.6*  --  19.2*  --   CREATININE  --    < >  --    < > 1.34* 1.17 1.21 1.12  --  1.03   < > = values in this interval not displayed.    Estimated Creatinine Clearance: 71.5 mL/min (by C-G formula based on SCr of 1.03 mg/dL).    Allergies  Allergen Reactions  . Empagliflozin     Other reaction(s): diarrhea  . 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 Other     Other reaction(s): Unknown  . Simvastatin     Other reaction(s): diarrhea  . Sitagliptin     Other reaction(s): diarrhea/abd pain    Antimicrobials this admission: Zosyn  7/14>>7/21 Fluconazole 7/17>> Cefuroxime 6/23>>6/25 Vancomycin 6/24>6/24; resume 6/26>>7/2, 7/5>>7/13, 7/22> Meropenem 6/26>>7/4, 7/5>>7/14, 7/22> [name] 7/7>>7/14  Dose adjustments thisdosing regimen: 6/30 VR 33, 7/1 22 (ke 0.026, T1/2 26.6) - change to vancomycin 1750 mg q36 7/7 VT = 19 (drawn prior to 3rd dose) - reduce to 1g IV every 24 hours 7/11 VT 21 - reduce to 750 q 24, retime for later tonight>start 7/12 AM  Microbiology results: 7/3+7/5 TA - few yeast> few candida topicalis  7/5 + 7/7 Bcx: neg 7/7 Ucx: neg 7/9 Wound Cx: neg 7/12 BAL: candida albicans, candida tropicalis 7/14 pleural fluid: rare candida parapsilosis 7/14 Blood>>>candida parapsilosis  7/16 abscess: ngF 7/17 BCx: ngtd 7/22 Bcx: ngtd  7/22 TA: no orgs seen >> reincubating  KAntonietta Jewel PharmD, BLincoln ParkClinical Pharmacist  Phone: [phone]/25/2021 8:14 AM  Please check AMION for all [place]phone numbers After 10:00 PM, call [phone]

## 2019-09-02 NOTE — Consult Note (Signed)
Chief Complaint: Patient was seen in consultation today for percutaneous gastrostomy placement and for percutaneous cholecystostomy follow up.  Referring Physician(s): Lynnell Catalan  Supervising Physician: Oley Balm  Patient Status: [facility] - In-pt  History of Present Illness: GREOGORY CORNETTE is a 64 y.o. male with a past medical history significant for depression, hearing loss, arthritis, tobacco use, marijuana use, spinal stenosis, chronic pain, HLD, DM, HTN, bilateral carotid artery stenosis with previous bilateral carotid endarterectomy and CHF who presented to [facility] ED on 07/26/19 with complaints of dyspnea and leg swelling. Initial evaluation showed BNP 2568, troponin 115, CXR consistent with CHF. He was given IV lasix and was admitted for further evaluation and management. He underwent left and right heart cath on 6/21 which showed severe 3 vessel CAD with chronic total occlusions of both the LAD and right coronary arteries. He underwent CABG x 4 on 6/24 with CT surgery and unfortunately had a sudden VT/VF cardiac arrest on 6/25. He underwent emergent cardiac massage and defibrillation before being taken to the OR and was placed on ECMO (decannulated 6/28). He has had a prolonged and complicated hospital course since that time including intubation with mechanical ventilation, impella LVAD placement 6/28 (removed 7/9), development of cardiogenic shock/cardiac tamponade 6/29, bedside mediastinal exploration 6/29, TEE and wound vac placement 7/2, arterial line placement, cortrak placement, CVC placement, bronchoscopy with tracheostomy placement, chest tube placement, recurrent fevers, bacteremia, fungemia and percutaneous cholecystostomy placement 7/16. IR has been asked to evaluate the patient for percutaneous gastrostomy placement.  Patient seen at bedside, no family or staff present. Opens eyes but does not follow commands, quickly falls back to sleep.  Past Medical History:  Diagnosis Date    . Arthritis   . CHF (congestive heart failure) (HCC)   . Depression   . Diabetes mellitus without complication (HCC)   . Hard of hearing   . Hyperlipidemia   . Hypertension     Past Surgical History:  Procedure Laterality Date  . APPENDECTOMY  1995  . APPLICATION OF WOUND VAC N/A 08/10/2019   Procedure: APPLICATION OF WOUND VAC;  Surgeon: [name], MD;  Location: [facility] OR;  Service: Thoracic;  Laterality: N/A;  Prevena dressing to wound vac  . CANNULATION FOR ECMO (EXTRACORPOREAL MEMBRANE OXYGENATION) N/A 08/03/2019   Procedure: CANNULATION FOR ECMO (EXTRACORPOREAL MEMBRANE OXYGENATION);  Surgeon: [name], MD;  Location: [facility] OR;  Service: Open Heart Surgery;  Laterality: N/A;  . CANNULATION FOR ECMO (EXTRACORPOREAL MEMBRANE OXYGENATION) N/A 08/06/2019   Procedure: DECANNULATION FOR ECMO (EXTRACORPOREAL MEMBRANE OXYGENATION);  Surgeon: [name], MD;  Location: [facility] OR;  Service: Open Heart Surgery;  Laterality: N/A;  . CAROTID ENDARTERECTOMY  2010  . CORONARY ARTERY BYPASS GRAFT N/A 08/02/2019   Procedure: CORONARY ARTERY BYPASS GRAFTING (CABG), ON PUMP, TIMES FOUR, USING LEFT INTERNAL MAMMARY, LEFT RADIAL ARTERY, AND ENDOSCOPICALLY HARVESTED RIGHT GREATER SAPHENOUS VEIN;  Surgeon: [name], MD;  Location: [facility] OR;  Service: Open Heart Surgery;  Laterality: N/A;  . EXPLORATION POST OPERATIVE OPEN HEART N/A 08/07/2019   Procedure: MEDIASTINAL EXPLORATION POST OPERATIVE OPEN HEART IN 2H02;  Surgeon: [name], MD;  Location: [facility] OR;  Service: Open Heart Surgery;  Laterality: N/A;  Bedside procedure 2H02/emergency  . PLACEMENT OF IMPELLA LEFT VENTRICULAR ASSIST DEVICE Right 08/06/2019   Procedure: PLACEMENT OF IMPELLA LEFT VENTRICULAR ASSIST DEVICE;  Surgeon: [name], MD;  Location: [facility] OR;  Service: Open Heart Surgery;  Laterality: Right;  . RADIAL ARTERY HARVEST Left  08/02/2019   Procedure: LEFT RADIAL ARTERY HARVEST;  Surgeon: [name], MD;  Location: [facility] OR;  Service: Open Heart Surgery;  Laterality: Left;  . REMOVAL OF IMPELLA LEFT VENTRICULAR ASSIST DEVICE N/A 08/17/2019   Procedure: REMOVAL OF IMPELLA LEFT VENTRICULAR ASSIST DEVICE;  Surgeon: [name], MD;  Location: [facility] OR;  Service: Open Heart Surgery;  Laterality: N/A;  . RIGHT/LEFT HEART CATH AND CORONARY ANGIOGRAPHY N/A 07/30/2019   Procedure: RIGHT/LEFT HEART CATH AND CORONARY ANGIOGRAPHY;  Surgeon: Kathleene Hazel, MD;  Location: [facility] INVASIVE CV LAB;  Service: Cardiovascular;  Laterality: N/A;  . STERNAL CLOSURE N/A 08/10/2019   Procedure: STERNAL CLOSURE;  Surgeon: [name], MD;  Location: [facility] OR;  Service: Thoracic;  Laterality: N/A;  . TEE WITHOUT CARDIOVERSION N/A 08/02/2019   Procedure: TRANSESOPHAGEAL ECHOCARDIOGRAM (TEE);  Surgeon: [name], MD;  Location: [facility] OR;  Service: Open Heart Surgery;  Laterality: N/A;  . TEE WITHOUT CARDIOVERSION N/A 08/06/2019   Procedure: TRANSESOPHAGEAL ECHOCARDIOGRAM (TEE);  Surgeon: [name], MD;  Location: [facility] OR;  Service: Open Heart Surgery;  Laterality: N/A;  . TEE WITHOUT CARDIOVERSION N/A 08/10/2019   Procedure: TRANSESOPHAGEAL ECHOCARDIOGRAM (TEE);  Surgeon: [name], MD;  Location: [facility] OR;  Service: Thoracic;  Laterality: N/A;  . TEE WITHOUT CARDIOVERSION N/A 08/17/2019   Procedure: TRANSESOPHAGEAL ECHOCARDIOGRAM (TEE);  Surgeon: [name], MD;  Location: [facility] OR;  Service: Open Heart Surgery;  Laterality: N/A;  . WOUND DEBRIDEMENT N/A 08/03/2019   Procedure: DEBRIDEMENT WOUND;  Surgeon: [name], MD;  Location: [facility] OR;  Service: Vascular;  Laterality: N/A;    Allergies: Empagliflozin, Heparin, Other, Simvastatin, and Sitagliptin  Medications: Prior to Admission medications   Medication Sig Start Date End Date Taking? Authorizing Provider  citalopram (CELEXA) 40 MG tablet Take 40 mg by mouth daily.   Yes [provider]  dapagliflozin  propanediol (FARXIGA) 10 MG TABS tablet Take 10 mg by mouth daily.   Yes [provider]  ergocalciferol (VITAMIN D2) 1.25 MG (50000 UT) capsule Take 50,000 Units by mouth 2 (two) times a week.   Yes [provider]  furosemide (LASIX) 20 MG tablet Take 40 mg by mouth daily. 07/16/19  Yes [provider]  glipiZIDE-metformin (METAGLIP) 2.5-500 MG tablet Take 2 tablets by mouth 2 (two) times daily with a meal.   Yes [provider]  ibuprofen (ADVIL) 200 MG tablet Take 400 mg by mouth 2 (two) times daily as needed for moderate pain.   Yes [provider]  levofloxacin (LEVAQUIN) 750 MG tablet Take 750 mg by mouth daily. For 14 days.   Yes [provider]  lisinopril (ZESTRIL) 20 MG tablet Take 20 mg by mouth daily.   Yes [provider]  oxyCODONE (ROXICODONE) 15 MG immediate release tablet Take 15 mg by mouth 3 (three) times daily.   Yes [provider]  pioglitazone (ACTOS) 45 MG tablet Take 45 mg by mouth daily.   Yes [provider]  promethazine (PHENERGAN) 25 MG tablet Take 25 mg by mouth 2 (two) times daily as needed for nausea or vomiting.   Yes [provider]     Family History  Problem Relation Age of Onset  . Heart disease Mother   . Hypertension Mother     Social History   Socioeconomic History  . Marital status: Unknown    Spouse name: Not on file  . Number of children: Not on file  .  Years of education: Not on file  . Highest education level: Not on file  Occupational History  . Not on file  Tobacco Use  . Smoking status: Current Every Day Smoker    Packs/day: 2.50    Types: Cigarettes  . Smokeless tobacco: Never Used  Substance and Sexual Activity  . Alcohol use: Not on file  . Drug use: Yes    Frequency: 7.0 times per week    Types: Marijuana  . Sexual activity: Not on file  Other Topics Concern  . Not on file  Social History Narrative  . Not on file   Social  Determinants of Health   Financial Resource Strain:   . Difficulty of Paying Living Expenses:   Food Insecurity:   . Worried About Programme researcher, broadcasting/film/video in the Last Year:   . Barista in the Last Year:   Transportation Needs:   . Freight forwarder (Medical):   Marland Kitchen Lack of Transportation (Non-Medical):   Physical Activity:   . Days of Exercise per Week:   . Minutes of Exercise per Session:   Stress:   . Feeling of Stress :   Social Connections:   . Frequency of Communication with Friends and Family:   . Frequency of Social Gatherings with Friends and Family:   . Attends Religious Services:   . Active Member of Clubs or Organizations:   . Attends Banker Meetings:   Marland Kitchen Marital Status:      Review of Systems: A 12 point ROS discussed and pertinent positives are indicated in the HPI above.  All other systems are negative.  Review of Systems  Unable to perform ROS: Patient nonverbal    Vital Signs: BP (!) 116/47 (BP Location: Left Arm)   Pulse 80   Temp 98.7 F (37.1 C) (Oral)   Resp (!) 24   Ht 5\' 9"  (1.753 m)   Wt 158 lb 11.7 oz (72 kg)   SpO2 98%   BMI 23.44 kg/m   Physical Exam Vitals and nursing note reviewed.  Constitutional:      Appearance: He is ill-appearing.     Comments: Somnolent, briefly opens eyes to stimuli  HENT:     Head: Normocephalic.  Cardiovascular:     Rate and Rhythm: Normal rate and regular rhythm.  Pulmonary:     Comments: Tracheostomy, ventilator (+) Bilateral chest tubes Abdominal:     Palpations: Abdomen is soft.     Comments: (+) RUQ drain to gravity with bilious output. Insertion site unremarkable.  Skin:    General: Skin is warm and dry.      MD Evaluation Airway: WNL Airway comments: Trached Heart: WNL Abdomen: WNL Chest/ Lungs: Other (comments) Chest/ lungs comments: left sided and right sided chest tube present ASA  Classification: 3 Mallampati/Airway Score: Two   Imaging: EEG  Result Date:  08/31/2019 Charlsie Quest, MD     08/31/2019 12:26 PM Patient Name: WILIAM CAUTHORN MRN: [id number] Epilepsy Attending: Charlsie Quest Referring Physician/Provider: Dr Albertha Ghee Date: 08/31/2019 Duration: 26.16 mins Patient history: 64yo M with ams. EEG to evaluate for seizure Level of alertness:  lethargic AEDs during EEG study: None Technical aspects: This EEG study was done with scalp electrodes positioned according to the 10-20 International system of electrode placement. Electrical activity was acquired at a sampling rate of 500Hz  and reviewed with a high frequency filter of 70Hz  and a low frequency filter of 1Hz . EEG data were recorded continuously  and digitally stored. Description: No posterior dominant rhythm. EEG showed continuous generalized mixed frequencies with predominantly 5 to 6 Hz theta slowing with intermittent 13-15hz  frontocentral beta activity as well as 2-3hz  intermittent delta slowing. Hyperventilation and photic stimulation were not performed.   ABNORMALITY. -Continuous slow, generalized IMPRESSION: This study is suggestive of severe diffuse encephalopathy, nonspecific etiology but likely related to sedation, toxic-metabolic etiology. No seizures or epileptiform discharges were seen throughout the recording. Charlsie Quest   DG Chest 1 View  Result Date: 08/09/2019 CLINICAL DATA:  64 year old male admitted 07/26/2019, status post CABG 08/02/2019, emergent median sternotomy and cardiac massage for arrest 08/03/2019, ECMO initiation 08/03/2019 decannulation 08/06/2019, Impella placement 08/06/2019, mediastinal exploration 08/07/2019, with 4 chest tubes/mediastinal drains and open chest EXAM: CHEST  1 VIEW COMPARISON:  Multiple prior, most recent chest x-ray 08/08/2019 FINDINGS: Cardiomediastinal silhouette unchanged, with surgical changes of CABG. Unchanged position of the Impella device from right axillary artery approach. Unchanged position of the endotracheal tube terminating 5 cm  above the carina. Unchanged gastric tube which terminates in the left upper quadrant. Unchanged right IJ sheath transmits Swan-Ganz catheter which terminates in the right pulmonary arteries at the hilum. Unchanged defibrillator pads. Unchanged mediastinal/pleural drains with epicardial pacing leads projecting over the lower mediastinum. Similar appearance of low lung volumes and hazy opacity at the right lung base. Obscuration of the right hemidiaphragm. No pneumothorax. IMPRESSION: Similar appearance of the chest x-ray to the prior, with right basilar opacity representing pleural effusion and atelectasis/consolidation. Surgical support apparatus unchanged. Electronically Signed   By: Gilmer Mor D.O.   On: 08/09/2019 09:32   DG Chest 1 View  Result Date: 08/07/2019 CLINICAL DATA:  Intubation. EXAM: CHEST  1 VIEW COMPARISON:  Chest x-ray 08/06/2019. FINDINGS: Endotracheal tube, NG tube, Swan-Ganz catheter, bilateral chest tubes in stable position. Impella device in stable position. Prior CABG. Cardiomegaly. Mild bilateral interstitial prominence again noted small bilateral pleural effusions again noted. Findings consistent with CHF. Similar findings noted on prior exam. No pneumothorax. IMPRESSION: 1. Lines and tubes including bilateral chest tubes in stable position. No pneumothorax. 2. Prior CABG. Cardiomegaly with bilateral interstitial prominence and bilateral pleural effusions again noted consistent with CHF. Similar findings noted on prior exam. Bibasilar atelectasis. Electronically Signed   By: Maisie Fus  Register   On: 08/07/2019 08:07   DG Chest 1 View  Result Date: 08/06/2019 CLINICAL DATA:  ECMO.  Ventilator dependence. EXAM: CHEST  1 VIEW COMPARISON:  08/05/2019 FINDINGS: Endotracheal tube tip is 4.8 cm above the base of the carina. No visible air column in the right mainstem bronchus. Right IJ pulmonary artery catheter tip is in the interlobar pulmonary artery, as before. The NG tube passes into  the stomach although the distal tip position is not included on the film. Midline mediastinal/pericardial drain again noted with persistent bilateral chest tubes. ECMO catheter evident. No pneumothorax. Similar appearance of the diffuse interstitial and patchy basilar airspace disease, left greater than right. No substantial pleural effusion with left pleural fluid less visible today. Telemetry leads overlie the chest. IMPRESSION: 1. Interval improvement in left pleural effusion. 2. Otherwise no substantial interval change in exam. Diffuse interstitial and basilar airspace disease, left greater than right. 3. Support apparatus as described. Electronically Signed   By: Kennith Center M.D.   On: 08/06/2019 07:32   DG Abd 1 View  Result Date: 08/14/2019 CLINICAL DATA:  Feeding tube placement. EXAM: ABDOMEN - 1 VIEW COMPARISON:  08/13/2019 FINDINGS: Feeding tube tip noted projected over the mid  abdomen. This is most likely transverse portion of the duodenum. Small amount of adjacent intra lung contrast noted. One image obtained. 2 minutes 50 seconds fluoroscopy time utilized. IMPRESSION: Feeding tube noted with tip most likely in the transverse portion of the duodenum. Electronically Signed   By: Maisie Fus  Register   On: 08/14/2019 13:05   CT HEAD WO CONTRAST  Result Date: 08/30/2019 CLINICAL DATA:  Encephalopathy, altered mental status EXAM: CT HEAD WITHOUT CONTRAST TECHNIQUE: Contiguous axial images were obtained from the base of the skull through the vertex without intravenous contrast. 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 Unchanged appearance from prior. No evidence of acute infarction, hemorrhage, hydrocephalus, extra-axial collection or mass lesion/mass effect. Symmetric prominence of the ventricles, cisterns and sulci compatible with parenchymal volume loss. Patchy areas of white matter hypoattenuation are  most compatible with chronic microvascular angiopathy. Vascular: Atherosclerotic calcification of the carotid siphons and intradural vertebral arteries. No hyperdense vessel. Skull: Image quality degraded by motion artifact. No significant swelling or calvarial fracture. No worrisome osseous lesions. Sinuses/Orbits: Paranasal sinuses predominantly clear with a nasoenteric tube in place. Bilateral mastoid and middle ear effusions are noted. Included orbital structures are unremarkable. Other: Edentulous. IMPRESSION: 1. Image quality degraded by motion artifact. 2. No definite acute intracranial abnormality. 3. 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 4. Chronic microvascular angiopathy and parenchymal volume loss. 5. Bilateral mastoid and middle ear effusions. Similar finding on prior. Electronically Signed   By: Kreg Shropshire M.D.   On: 08/30/2019 15:36   CT HEAD WO CONTRAST  Result Date: 08/19/2019 CLINICAL DATA:  Subacute neurological deficits. Specific symptoms not described. Recent LVAD removal. EXAM: CT HEAD WITHOUT CONTRAST TECHNIQUE: Contiguous axial images were obtained from the base of the skull through the vertex without intravenous contrast. COMPARISON:  04/30/2010 FINDINGS: Brain: Generalized age related volume loss. Chronic small-vessel ischemic changes affect the pons. Age indeterminate 1 cm infarction in the inferior cerebellum on the right, favored to be old. Chronic appearing small vessel ischemic changes of the cerebral hemispheric white matter. Old appearing infarction of the left basal ganglia. No suggestion of acute infarction. No mass, hemorrhage, hydrocephalus or extra-axial collection. Vascular: There is atherosclerotic calcification of the major vessels at the base of the brain. Skull: Negative Sinuses/Orbits: Clear/normal Other: Mastoid effusions. IMPRESSION: No acute finding by CT. Likely old ischemic changes throughout the brain as outlined  above. Electronically Signed   By: Paulina Fusi M.D.   On: 08/19/2019 13:48   CT CHEST WO CONTRAST  Result Date: 08/30/2019 CLINICAL DATA:  Chest radiograph 08/30/2019, CT abdomen pelvis 08/28/2019 EXAM: CT CHEST WITHOUT CONTRAST TECHNIQUE: Multidetector CT imaging of the chest was performed following the standard protocol without IV contrast. COMPARISON:  Chest radiograph 08/30/2019, CT abdomen and pelvis 08/28/2019 FINDINGS: Cardiovascular: Cardiomegaly. Small volume pericardial fluid. Hypoattenuation of the cardiac blood pool relative to the myocardium compatible with anemia. Evidence of prior sternotomy and CABG as well as postsurgical changes from recent LVAD removal. Atherosclerotic plaque within the normal caliber aorta. Plaque noted throughout the proximal great vessels as well. Mild central pulmonary artery enlargement, could reflect some pulmonary artery hypertension. Left IJ approach central venous catheter tip terminates in the lower SVC. Mediastinum/Nodes: Postsurgical changes from recent sternotomy with a small retrosternal fluid collection, nonspecific in the setting of recent operative intervention, measuring 3.7 x 3.1 cm in size (3/91). No mediastinal gas. Additional stranding in the  anterior mediastinum likely related to operative intervention as well. Evidence of prior CABG. Tracheostomy tube is in place, tip terminating in the mid trachea 5 cm from the carina. Small amount of fluid above the tracheostomy balloon. A transesophageal tube is noted with the tip terminating beyond the gastric antrum, incompletely visualized. Scattered low-attenuation subcentimeter nodes throughout the mediastinum are likely reactive. Hilar adenopathy difficult to assess in the absence of contrast media. No worrisome axillary lymph nodes. Thyroid gland and thoracic inlet are free of acute abnormality. Lungs/Pleura: Bilateral pleural effusions despite the presence of bilateral apically directed chest tubes which  remain in position. Portions of these collections appear somewhat loculated with extension into the fissures. Associated passive atelectasis is noted with additional areas of mixed heterogeneous and ground-glass opacity within the lungs as well as more heterogeneous attenuation of the atelectatic lung itself which could suggest some underlying infection. Findings are on a background diffuse airways thickening and scattered secretions as well as moderate to severe paraseptal predominant emphysematous changes throughout the lungs. No visible residual pneumothorax. Upper Abdomen: Percutaneous cholecystostomy tube in place. Small volume ascites in the upper abdomen. Stable mild bilateral perinephric stranding. No acute abnormalities present in the visualized portions of the upper abdomen. Lobular thickening of the adrenal glands similar to prior. Musculoskeletal: Multilevel degenerative changes are present in the imaged portions of the spine. Recent sternotomy. Diffuse body wall edema. Postsurgical changes in the soft tissues of the right axillary region may reflect recent cutdown or access attempt. IMPRESSION: 1. Postsurgical changes sternotomy a likely related to recent LVAD removal. Retrosternal fluid collection, nonspecific given the operative history. Infection cannot be fully excluded. 2. Bilateral pleural effusions despite the presence of bilateral apically directed chest tubes which remain in position. Portions of these collections appear somewhat loculated with extension into the fissures concerning for developing empyema. With heterogeneity within the areas of dependent and passive atelectasis and additional areas of mixed consolidative and ground-glass airspace disease suggesting superimposed infection, inflammation, or given airways thickening and secretions, sequela of aspiration. 3. Tracheostomy tube is in place, tip terminating in the mid trachea 5 cm from the carina. Small amount of fluid above the  tracheostomy balloon. 4. Small volume ascites in the upper abdomen. 5. Percutaneous cholecystostomy tube in place. 6. Diffuse body wall edema. 7. Cardiomegaly, evidence of prior CABG. Extensive native coronary artery calcification. 8. Likely anemia. 9. Aortic Atherosclerosis (ICD10-I70.0). Electronically Signed   By: Kreg Shropshire M.D.   On: 08/30/2019 15:48   CT ABDOMEN W CONTRAST  Result Date: 08/28/2019 CLINICAL DATA:  Fungemia, respiratory failure, acute cholecystitis with percutaneous cholecystostomy EXAM: CT ABDOMEN WITH CONTRAST TECHNIQUE: Multidetector CT imaging of the abdomen was performed using the standard protocol following bolus administration of intravenous contrast. CONTRAST:  OMNIPAQUE IOHEXOL 300 MG/ML  SOLN COMPARISON:  08/24/2019, 08/23/2019 FINDINGS: Lower chest: Right chest tube is unchanged. Continued dependent lower lobe atelectasis with small bilateral pleural effusions, left greater than right. Scattered areas of ground-glass airspace disease are seen within the right middle and right lower lobe, and could reflect hypoventilatory changes versus developing infection. Hepatobiliary: Stable position of the percutaneous cholecystostomy tube, with complete decompression of the gallbladder. The liver is unremarkable. Pancreas: Unremarkable. No pancreatic ductal dilatation or surrounding inflammatory changes. Stable coarse calcifications within the uncinate process. Spleen: Normal in size without focal abnormality. Adrenals/Urinary Tract: Stable left adrenal nodule. The right adrenal is stable. Chronic scarring of the lower pole left kidney with compensatory hypertrophy of the right kidney. No urinary tract  calculi or obstructive uropathy. Stomach/Bowel: No bowel obstruction or ileus. No wall thickening or inflammatory change. Enteric catheter is seen, tip extending into the proximal jejunum. Vascular/Lymphatic: There is extensive atherosclerosis throughout the abdominal aorta, stable. No  pathologic adenopathy. Other: There is trace residual free fluid within the right upper quadrant. No free intraperitoneal gas. Musculoskeletal: No acute or destructive bony lesions. Reconstructed images demonstrate no additional findings. IMPRESSION: 1. Stable position of the percutaneous cholecystostomy tube, with complete decompression of the gallbladder. 2. Trace residual free fluid within the right upper quadrant, decreased since prior. 3. Trace bilateral pleural effusions, left greater than right. 4. Scattered areas of ground-glass airspace disease within the right middle and right lower lobe, which could reflect hypoventilatory changes versus developing infection. 5. Aortic Atherosclerosis (ICD10-I70.0). Electronically Signed   By: Sharlet Salina M.D.   On: 08/28/2019 22:05   CT ABDOMEN W CONTRAST  Result Date: 08/23/2019 CLINICAL DATA:  Cholecystitis. Fever and leukocytosis. Elevated bilirubin. EXAM: CT ABDOMEN WITH CONTRAST TECHNIQUE: Multidetector CT imaging of the abdomen was performed using the standard protocol following bolus administration of intravenous contrast. CONTRAST:  80mL OMNIPAQUE IOHEXOL 300 MG/ML  SOLN COMPARISON:  07/30/2014 from [facility] FINDINGS: Lower chest: Small bilateral pleural effusions and dependent bibasilar atelectasis. Hepatobiliary: No hepatic masses identified. High attenuation sludge is seen in the gallbladder, however there are no findings of acute cholecystitis or biliary ductal dilatation. Pancreas:  No mass or inflammatory changes. Spleen:  Within normal limits in size and appearance. Adrenals/Urinary Tract: 1.4 cm left adrenal mass is seen which is nonspecific, but most likely represents a benign adenoma in patient without history of cancer. Moderate left renal atrophy is seen. No evidence of renal masses or hydronephrosis. Stomach/Bowel: A feeding tube is seen with tip in the proximal jejunum. No evidence of dilated bowel loops. Mild diffuse mesenteric  edema and ascites is seen, but there is no evidence of focal inflammatory process or abscess. Vascular/Lymphatic: No pathologically enlarged lymph nodes identified. No abdominal aortic aneurysm. Aortic atherosclerosis noted. Other:  None. Musculoskeletal:  No suspicious bone lesions identified. IMPRESSION: Gallbladder sludge. No radiographic evidence of acute cholecystitis or biliary ductal dilatation. Mild ascites and diffuse mesenteric edema. No evidence of focal inflammatory process or abscess. Small bilateral pleural effusions and dependent bibasilar atelectasis. Small indeterminate left adrenal mass. If patient has no history of malignancy, this is probably a benign adenoma and follow-up by CT is recommended in 12 months. If patient does have cancer history, adrenal protocol abdomen CT without and with contrast is recommended now for further characterization. This recommendation follows ACR consensus guidelines: Management of Incidental Adrenal Masses: A White Paper of the ACR Incidental Findings Committee. J Am Coll Radiol 2017;14:1038-1044. Aortic Atherosclerosis (ICD10-I70.0). Electronically Signed   By: Danae Orleans M.D.   On: 08/23/2019 17:54   NM Hepatobiliary Liver Func  Result Date: 08/23/2019 CLINICAL DATA:  Cholecystitis scratch set evaluate cholecystitis. Elevated bilirubin. EXAM: NUCLEAR MEDICINE HEPATOBILIARY IMAGING TECHNIQUE: Sequential images of the abdomen were obtained out to 60 minutes following intravenous administration of radiopharmaceutical. RADIOPHARMACEUTICALS:  4.9 mCi Tc-59m  Choletec IV COMPARISON:  Abdominal sonogram from 08/16/2019 FINDINGS: Prompt uptake of activity by the liver is seen. Delayed biliary activity is noted beginning around 25 minutes post injection. Biliary activity passes into small bowel, consistent with patent common bile duct. After 1 hour no gallbladder activity is identified and there is persistent diffuse radiotracer activity throughout the liver. The  patient subsequently received 3 mg of morphine, IV and continuous  imaging was performed for an additional 30 minutes. No gallbladder activity identified following morphine administration. IMPRESSION: 1. No gallbladder activity identified after 90 minutes of imaging and following IV administration of 3 mg of morphine. Patency of the cystic duct cannot be confirmed. 2. There is delayed biliary activity occurring approximately 25 minutes post injection of the radiopharmaceutical. In addition, there is persistence of radiotracer activity throughout the liver following 60 minutes and 90 minutes of imaging which suggest some degree of hepatic dysfunction. 3. Biliary to bowel activity is identified confirming patency of the common bile duct. Electronically Signed   By: Signa Kell M.D.   On: 08/23/2019 11:23   DG Chest Port 1 View  Result Date: 09/02/2019 CLINICAL DATA:  Chest tube placement EXAM: PORTABLE CHEST 1 VIEW COMPARISON:  08/31/2019 FINDINGS: Tracheostomy tube tip is above the carina. Left IJ catheter tip projects over the cavoatrial junction. Feeding tube is in place with tip below the field of view. Bilateral chest tubes in place without pneumothorax. Stable changes from median sternotomy and CABG procedure. Cardiac enlargement, unchanged. Small to moderate left pleural effusion is unchanged. Diffuse interstitial opacities are identified compatible with pulmonary edema. Similar to previous exam. IMPRESSION: 1. No change in aeration to the lungs compared with previous exam. 2. Stable support apparatus. 3. No pneumothorax. Electronically Signed   By: Signa Kell M.D.   On: 09/02/2019 07:11   DG Chest Port 1 View  Result Date: 08/31/2019 CLINICAL DATA:  Tracheostomy.  Chest tubes. EXAM: PORTABLE CHEST 1 VIEW COMPARISON:  CT 08/30/2019. FINDINGS: Tracheostomy tube, feeding tube, left IJ line, bilateral chest tubes in stable position. Prior CABG. Stable cardiomegaly. Diffuse bilateral pulmonary  infiltrates/edema slightly progressed from prior exam. Small bilateral pleural effusions. No pneumothorax. IMPRESSION: 1. Lines and tubes including bilateral chest tubes in stable position. No pneumothorax. 2. Prior CABG. Cardiomegaly again noted. Diffuse bilateral pulmonary infiltrates/edema slightly progressed from prior exam. Small bilateral pleural effusions. Electronically Signed   By: Maisie Fus  Register   On: 08/31/2019 08:19   DG CHEST PORT 1 VIEW  Result Date: 08/30/2019 CLINICAL DATA:  Chest tube. EXAM: PORTABLE CHEST 1 VIEW COMPARISON:  08/27/2019. FINDINGS: Interim removal of left PICC line and placement of left IJ line. Left IJ line tip at cavoatrial junction. Tracheostomy tube and feeding tube in stable position. Bilateral chest tubes in stable position. Prior CABG. Stable cardiomegaly. Diffuse bilateral interstitial prominence consistent with interstitial edema and/or pneumonitis again noted. Small right pleural effusion cannot be excluded. Left costophrenic angle not completely imaged. No pneumothorax. Surgical staples noted over the right chest. IMPRESSION: 1. Interim removal of left PICC line and placement of left IJ line. Left IJ line tip at cavoatrial junction. Tracheostomy tube and feeding tube in stable position. Bilateral chest tubes stable position. No pneumothorax. 2. Prior CABG. Stable cardiomegaly. Diffuse bilateral interstitial prominence consistent interstitial edema and/or pneumonitis again noted. Similar findings noted on prior exam. Small right pleural effusion cannot be excluded. Left costophrenic angle incompletely imaged. Electronically Signed   By: Maisie Fus  Register   On: 08/30/2019 07:06   DG Chest Port 1 View  Result Date: 08/29/2019 CLINICAL DATA:  Central line placement EXAM: PORTABLE CHEST 1 VIEW COMPARISON:  Earlier same day FINDINGS: Left internal jugular central line tip in the SVC 2 cm above the right atrium. Left arm PICC tip at the SVC RA junction or just in the  right atrium. No pneumothorax. Left chest tube remains in place. Soft feeding tube enters the abdomen. Tracheostomy in  place. Atelectasis in the lower lungs persists, left worse than right. IMPRESSION: Left internal jugular central line tip in the SVC 2 cm above the right atrium. No pneumothorax. No other change. Electronically Signed   By: Paulina Fusi M.D.   On: 08/29/2019 17:16   DG CHEST PORT 1 VIEW  Result Date: 08/29/2019 CLINICAL DATA:  Hypoxia EXAM: PORTABLE CHEST 1 VIEW COMPARISON:  08/27/2019 FINDINGS: Tracheostomy tube and feeding catheter are again noted and stable. Postsurgical changes are again seen. Left-sided PICC line is noted and stable. Bilateral thoracostomy catheters are seen without evidence of pneumothorax. Stable left retrocardiac opacity is noted. The previously seen interstitial prominence has improved in the interval from the prior exam. IMPRESSION: Persistent left retrocardiac opacity. Tubes and lines as described. Electronically Signed   By: Alcide Clever M.D.   On: 08/29/2019 15:31   DG Chest Port 1 View  Result Date: 08/27/2019 CLINICAL DATA:  Respiratory failure EXAM: PORTABLE CHEST 1 VIEW COMPARISON:  08/25/2019 FINDINGS: Tracheostomy tube in satisfactory positioning. Enteric tube courses below the diaphragm with distal tip beyond the inferior margin of the film. Bilateral chest tubes remain in position. Left-sided PICC line terminates at the level of the superior cavoatrial junction. Post CABG changes. Stable cardiomediastinal contours. Diffuse interstitial prominence, left slightly greater than right, is similar in appearance to prior. Probable small left pleural effusion. No pneumothorax. IMPRESSION: 1. No significant interval change from prior. 2. Diffuse interstitial prominence, left slightly greater than right. Probable small left pleural effusion. 3. Support lines and tubes, as above. Electronically Signed   By: Duanne Guess D.O.   On: 08/27/2019 08:05   DG Chest  Port 1 View  Result Date: 08/25/2019 CLINICAL DATA:  Status post CABG. EXAM: PORTABLE CHEST 1 VIEW COMPARISON:  08/20/1999 FINDINGS: Tracheostomy tube in satisfactory position. Feeding tube extending into the stomach. Stable bilateral chest tubes. No pneumothorax. Left PICC tip approximately 3 cm inferior to the superior cavoatrial junction. Mildly improved dense left lower lobe opacification. Small bilateral pleural effusions, improved on the right. Stable prominence of the interstitial markings. Stable post CABG changes. No acute bony abnormality. IMPRESSION: 1. Mildly improved dense left lower lobe atelectasis or pneumonia. 2. Small bilateral pleural effusions, improved on the right. 3. Stable probable combination of chronic interstitial lung disease and interstitial pulmonary edema. Electronically Signed   By: Beckie Salts M.D.   On: 08/25/2019 10:51   DG Chest Port 1 View  Result Date: 08/20/2019 CLINICAL DATA:  Tracheostomy. EXAM: PORTABLE CHEST 1 VIEW COMPARISON:  08/20/2019 FINDINGS: Tracheostomy appears well positioned. Soft feeding tube enters the abdomen. Central line tip in the SVC above the right atrium. Bilateral chest tubes as seen previously. No pneumothorax. Edema and volume loss persists. No worsening or new finding otherwise. IMPRESSION: Good appearance following tracheostomy placement. No complicating feature. Other lines and tubes unchanged. No pneumothorax. Edema and atelectasis as seen previously. Electronically Signed   By: Paulina Fusi M.D.   On: 08/20/2019 14:12   DG CHEST PORT 1 VIEW  Result Date: 08/20/2019 CLINICAL DATA:  Cardiac surgery. EXAM: PORTABLE CHEST 1 VIEW COMPARISON:  Multiple previous chest films. The most recent is 08/18/2019 FINDINGS: The endotracheal tube, NG tube, feeding tube, left subclavian central venous catheter, left PICC line and bilateral chest tubes are stable. External pacer paddles are noted. The cardiac silhouette, mediastinal and hilar contours are  stable. Persistent interstitial edema, bibasilar atelectasis and small effusions. No pneumothorax. IMPRESSION: 1. Stable support apparatus. 2. Persistent interstitial edema, bibasilar atelectasis  and small effusions. Electronically Signed   By: Rudie Meyer M.D.   On: 08/20/2019 07:45   DG Chest Port 1 View  Result Date: 08/18/2019 CLINICAL DATA:  Hypoxia EXAM: PORTABLE CHEST 1 VIEW COMPARISON:  August 15, 2019 FINDINGS: Endotracheal tube tip is 4.9 cm above the carina. Nasogastric tube tip and side port are below the diaphragm. Central catheter tips are in the superior vena cava. Temporary pacemaker wires are attached to the right heart. There is a chest tube on each side and a mediastinal drain. Impella device no longer appreciable. No pneumothorax. There are small pleural effusions with bibasilar atelectasis. Heart is upper normal in size with pulmonary vascularity normal. Patient is status post coronary artery bypass grafting. IMPRESSION: Tube and catheter positions as described without pneumothorax. Small pleural effusions bilaterally with bibasilar atelectasis. Stable cardiac silhouette. Electronically Signed   By: Bretta Bang III M.D.   On: 08/18/2019 08:10   DG CHEST PORT 1 VIEW  Result Date: 08/15/2019 CLINICAL DATA:  Chest tube EXAM: PORTABLE CHEST 1 VIEW COMPARISON:  Yesterday FINDINGS: Endotracheal tube tip is at the clavicular heads. Feeding and gastric suction tubes reaches the stomach. Left PICC with tip at the SVC. Left subclavian sheath in place. Impella in stable position.Chest tubes in place. Hazy opacity from atelectasis and pleural effusions. No visible pneumothorax. Stable heart size. IMPRESSION: Stable hardware positioning, atelectasis, and pleural fluid. No visible pneumothorax. Electronically Signed   By: Marnee Spring M.D.   On: 08/15/2019 07:08   DG CHEST PORT 1 VIEW  Result Date: 08/14/2019 CLINICAL DATA:  Chest tube in place EXAM: PORTABLE CHEST 1 VIEW COMPARISON:   08/13/2019 FINDINGS: Endotracheal tube, gastric catheter and feeding catheter are noted and stable. Impella device is noted in satisfactory position. Swan-Ganz catheter has been removed in the interval. Left-sided PICC line and left sided subclavian central line are now seen. Bilateral chest tubes are noted. Persistent right basilar opacity and effusion is seen. The left lung again shows retrocardiac opacities stable from the prior exam. The overlying interstitial prominence has improved likely related to decreased pulmonary edema. No pneumothorax is noted. IMPRESSION: Decrease in vascular congestion and edema. Persistent bibasilar opacities and effusions. New PICC line is seen in satisfactory position. Tubes and lines as described stable from the prior exam. Electronically Signed   By: Alcide Clever M.D.   On: 08/14/2019 02:36   DG CHEST PORT 1 VIEW  Result Date: 08/13/2019 CLINICAL DATA:  64 year old male status post central line placement. EXAM: PORTABLE CHEST 1 VIEW COMPARISON:  Chest x-ray 08/13/2019. FINDINGS: An endotracheal tube is in place with tip 4.9 cm above the carina. Right IJ Cordis through which a Swan-Ganz catheter has been passed into the right pulmonary artery. Bilateral chest tubes appear similarly position with tips projecting over the mid to upper thorax bilaterally. A feeding tube is seen extending into the abdomen, however, the tip of the feeding tube extends below the lower margin of the image. Midline mediastinal drain. Left ventricular assist device (Impella) via right subclavian approach. Midline skin staples and skin staples projecting over the right axillary region. Lung volumes are slightly low. Bibasilar opacities may reflect areas of atelectasis and/or consolidation. Small bilateral pleural effusions. Mild diffuse interstitial prominence. Pulmonary vasculature does not appear engorged. Mild cardiomegaly. Upper mediastinal contours are within normal limits. Status post median  sternotomy for CABG. IMPRESSION: 1. Support apparatus and postoperative changes, as above. 2. Low lung volumes with bibasilar areas of atelectasis and/or consolidation and small bilateral  pleural effusions. Diffuse interstitial prominence, similar to the prior study. 3. Mild cardiomegaly. Electronically Signed   By: Trudie Reed M.D.   On: 08/13/2019 11:20   DG Chest Port 1 View  Result Date: 08/13/2019 CLINICAL DATA:  Endotracheal tube.  LVAD. EXAM: PORTABLE CHEST 1 VIEW COMPARISON:  08/12/2019 FINDINGS: Endotracheal tube is in place, tip 3.3 centimeters above the carina. Feeding tube and nasogastric tube are in place, extending to the level of the stomach. LEFT ventricular assist device present unchanged in position. Status post median sternotomy and CABG. RIGHT Swan-Ganz catheter tip overlies the RIGHT pulmonary outflow tract. A RIGHT-sided PICC line tip overlies the superior vena cava. Stable cardiomegaly. Stable bibasilar opacities partially obscuring hemidiaphragms. Stable mild interstitial edema over recent studies. IMPRESSION: 1. Stable appearance cardiomegaly and mild pulmonary edema, bibasilar opacities. 2. Lines and tubes as described. Electronically Signed   By: Norva Pavlov M.D.   On: 08/13/2019 10:17   DG Chest Port 1 View  Result Date: 08/12/2019 CLINICAL DATA:  64 year old male admitted 07/26/2019, status post CABG 08/02/2019, emergent median sternotomy and cardiac massage for arrest 08/03/2019, ECMO initiation 08/03/2019 decannulation 08/06/2019, Impella placement 08/06/2019, mediastinal exploration 08/07/2019, with 4 chest tubes/mediastinal drains and open chest EXAM: PORTABLE CHEST 1 VIEW COMPARISON:  Multiple prior most recent 08/11/2019 FINDINGS: Cardiomediastinal silhouette is unchanged. Surgical changes of median sternotomy, now with intact sternal wires, CABG. Impella device from right axillary approach, unchanged. Endotracheal tube unchanged terminating 7.4 cm above the carina  at the level of the clavicular heads. Unchanged right IJ sheath transmits Swan-Ganz catheter which terminates in the right hilar pulmonary arteries. Unchanged pleural/mediastinal drains and epicardial pacing leads. Unchanged gastric tube terminating within the left upper quadrant. Unchanged enteric feeding tube which terminates out of the field of view. Interval removal of the defibrillator pads. Low lung volumes persist, as well as persisting opacity at the right lung base partially opacifying the right hemidiaphragm. Reticular opacities bilateral lungs. No pneumothorax. Blunting of the left costophrenic angle. IMPRESSION: Interval removal of defibrillator pads. Surgical changes of median sternotomy and CABG now with intact sternal wires. Similar appearance of low lung volumes with persisting right basilar opacity representing pleural fluid and atelectasis/consolidation. With the exception of the defibrillator pads, other surgical support apparatus unchanged as above. Electronically Signed   By: Gilmer Mor D.O.   On: 08/12/2019 08:18   DG CHEST PORT 1 VIEW  Result Date: 08/11/2019 CLINICAL DATA:  Cardiogenic shock. EXAM: PORTABLE CHEST 1 VIEW COMPARISON:  Radiograph yesterday. FINDINGS: Endotracheal tube tip at the clavicular heads. Enteric tube tip below the diaphragm in the stomach. Weighted enteric tube with tip in the right upper quadrant of the abdomen. Impella from a right upper extremity approach unchanged in position. Right internal jugular Swan-Ganz catheter tip in the region of the right pulmonary outflow tract. Bilateral chest tubes and presumed mediastinal drain in place. Stable heart size and mediastinal contours. Hazy opacity at both lung bases consistent with pleural effusions and atelectasis. Similar pulmonary edema. New median sternotomy and midline skin staples. No visualized pneumothorax. IMPRESSION: 1. Stable support apparatus as described. 2. Unchanged pulmonary edema, bilateral pleural  effusions and bibasilar atelectasis. No visualized pneumothorax. 3. New sternotomy wires since yesterday with midline skin staples. Electronically Signed   By: Narda Rutherford M.D.   On: 08/11/2019 02:02   DG CHEST PORT 1 VIEW  Result Date: 08/10/2019 CLINICAL DATA:  Bypass surgery. Impella device in place. EXAM: PORTABLE CHEST 1 VIEW COMPARISON:  08/09/2019 FINDINGS: The endotracheal  tube, NG tube, Swan-Ganz catheter, chest tubes and mediastinal drain tube are stable. The Impella device is in good position, unchanged. External pacer paddles are noted. Persistent mild interstitial edema, bibasilar atelectasis and probable small right effusion. No pneumothorax. IMPRESSION: 1. Stable support apparatus. 2. Stable edema, bibasilar atelectasis and probable small right effusion. Electronically Signed   By: Rudie Meyer M.D.   On: 08/10/2019 07:38   DG CHEST PORT 1 VIEW  Result Date: 08/08/2019 CLINICAL DATA:  LVAD EXAM: PORTABLE CHEST 1 VIEW COMPARISON:  Yesterday FINDINGS: Endotracheal tube with tip between the clavicular heads and carina. Enteric tube reaches the stomach. Impella from the right in unchanged position. Swan-Ganz catheter from the right IJ with tip at the right hilum. Chest tubes are present. Vascular congestion and hazy opacities at the right more the left base. No visible pneumothorax. Open sternum. IMPRESSION: Stable hardware positioning and pulmonary opacification. No visible pneumothorax. Electronically Signed   By: Marnee Spring M.D.   On: 08/08/2019 08:24   DG Chest Port 1 View  Result Date: 08/07/2019 CLINICAL DATA:  Status post chest wound exploration EXAM: PORTABLE CHEST 1 VIEW COMPARISON:  08/07/2019 FINDINGS: Endotracheal tube 4.5 cm above the carina. Nasogastric tube projecting over the stomach. Impella device in satisfactory unchanged position. Bilateral chest tubes. Swan-Ganz catheter tip projecting over the right ventricular outflow tract. Bilateral chest tubes in unchanged  position. No pneumothorax. Bilateral diffuse interstitial thickening. Trace right pleural effusion. Stable cardiomegaly. Prior CABG. IMPRESSION: 1. Support lines and tubes in satisfactory position. 2. Mild pulmonary edema. Trace right pleural effusion. Electronically Signed   By: Elige Ko   On: 08/07/2019 13:08   DG CHEST PORT 1 VIEW  Result Date: 08/06/2019 CLINICAL DATA:  Status post endotracheal tube placement EXAM: PORTABLE CHEST 1 VIEW COMPARISON:  June 28 21 FINDINGS: An Impella device is noted projecting over the heart. The Swan-Ganz catheter tip projects over the main right pulmonary artery. The endotracheal tube terminates above the carina by approximately 4.7 cm. The enteric tube extends below the left hemidiaphragm. A left-sided chest tube is noted. There is no definite pneumothorax. There are bilateral pleural effusions, right greater than left. The heart size remains enlarged. The patient is status post prior median sternotomy. There is vascular congestion with mild pulmonary edema. IMPRESSION: 1. Support devices as above. 2. Bilateral pleural effusions with interstitial edema. 3. No significant pneumothorax. Electronically Signed   By: Katherine Mantle M.D.   On: 08/06/2019 20:26   DG Chest Port 1 View  Result Date: 08/05/2019 CLINICAL DATA:  Follow-up for ARDS. EXAM: PORTABLE CHEST 1 VIEW COMPARISON:  08/04/2019 FINDINGS: Support apparatus including endotracheal tube, nasogastric tube, mediastinal tube, right internal jugular Swan-Ganz catheter, left chest tube and ECMO device are all stable. Lungs demonstrate bilateral vascular congestion and interstitial prominence, as well as opacity at the left lung base, the latter finding likely atelectasis, stable from the previous day's exam. Small left and probable small right pleural effusions. No pneumothorax. No mediastinal widening. Cardiac silhouette is normal in size. IMPRESSION: 1. Stable appearance of the lungs from the previous day's  exam with vascular congestion and interstitial thickening suggesting a component of interstitial edema. More confluent opacity at the left lung base is most likely atelectasis. Small left and suspected small right pleural effusions. No new lung abnormalities. 2. No pneumothorax. 3. Stable support apparatus. Electronically Signed   By: Amie Portland M.D.   On: 08/05/2019 08:32   DG Chest Port 1 View in am  Result  Date: 08/04/2019 CLINICAL DATA:  ECMO and history of CABG. EXAM: PORTABLE CHEST 1 VIEW COMPARISON:  08/03/2019. FINDINGS: Again noted are ECMO catheters overlying the chest. Right jugular central line with a Swan-Ganz catheter is present. Theone Murdoch catheter tip is in the distal right pulmonary artery region and unchanged. Evidence for bilateral chest tubes. Endotracheal tube is appropriately positioned above the carina. Low lung volumes with diffuse interstitial densities likely representing some pulmonary edema. Overall, lung low lung volumes. Cannot exclude increased densities in the left apex and medial left chest. Nasogastric tube extends into the abdomen. Negative for a pneumothorax. IMPRESSION: 1. Low lung volumes and evidence for interstitial edema. Cannot exclude increased densities in the left apical region. 2. Support apparatuses as described. Bilateral chest tubes without a pneumothorax. Electronically Signed   By: Richarda Overlie M.D.   On: 08/04/2019 09:05   DG Abd Portable 1V  Result Date: 08/13/2019 CLINICAL DATA:  Feeding tube advanced.  Check tube placement. EXAM: PORTABLE ABDOMEN - 1 VIEW COMPARISON:  08/10/2019 FINDINGS: Feeding tube has been advanced, tip in the region of the pylorus. A loop of tube extends into the gastric fundus. Nasogastric tube is in place, tip overlying the level of the stomach. Swan-Ganz catheter, LVAD again noted. There is paucity of bowel gas. No evidence for intraperitoneal air. IMPRESSION: Feeding tube tip in the region of the pylorus. Electronically Signed    By: Norva Pavlov M.D.   On: 08/13/2019 10:17   DG Abd Portable 1V  Result Date: 08/10/2019 CLINICAL DATA:  Cortrak placement EXAM: PORTABLE ABDOMEN - 1 VIEW COMPARISON:  08/06/2019 FINDINGS: Interval placement of enteric feeding tube, tip positioned near the pylorus although within the stomach. Esophagogastric tube with tip and side port below the diaphragm. General paucity of bowel gas. No obvious free air on supine radiograph. IMPRESSION: Interval placement of enteric feeding tube, tip positioned near the pylorus although within the stomach. Recommend advancement if post pyloric positioning is desired. Electronically Signed   By: Lauralyn Primes M.D.   On: 08/10/2019 16:10   DG Abd Portable 1V  Result Date: 08/06/2019 CLINICAL DATA:  OG tube placement. EXAM: PORTABLE ABDOMEN - 1 VIEW COMPARISON:  None. FINDINGS: The OG tube projects over the gastric body. The bowel gas pattern is nonobstructive and nonspecific. IMPRESSION: OG tube projects over the gastric body. Electronically Signed   By: Katherine Mantle M.D.   On: 08/06/2019 20:27   EEG adult  Result Date: 08/21/2019 Charlsie Quest, MD     08/21/2019 10:37 AM Patient Name: Khyan Oats MRN: [id number] Epilepsy Attending: Charlsie Quest Referring Physician/Provider: Dr Levon Hedger Date: 08/21/2019 Duration: 25.38 mins Patient history: 64yo M who presented with cardiogenic/hemorrhagic shock status post CABG complicated by VT arrest , continues to be encephalopathic. EEG to evaluate for seizure Level of alertness: lethargic AEDs during EEG study: None Technical aspects: This EEG study was done with scalp electrodes positioned according to the 10-20 International system of electrode placement. Electrical activity was acquired at a sampling rate of 500Hz  and reviewed with a high frequency filter of 70Hz  and a low frequency filter of 1Hz . EEG data were recorded continuously and digitally stored. Description: No posterior dominant rhythm was seen. EEG  showed continuous generalized polymorphic mixed frequencies with predominantly 5-9 Hz theta-alpha activity as well as intermittent 2-3hz  delta slowing.  Hyperventilation and photic stimulation were not performed.   ABNORMALITY -Continuous slow, generalized IMPRESSION: This study is suggestive of moderate to severe diffuse encephalopathy, nonspecific etiology. No  seizures or epileptiform discharges were seen throughout the recording. Charlsie Quest   DG C-Arm 1-60 Min-No Report  Result Date: 08/06/2019 Fluoroscopy was utilized by the requesting physician.  No radiographic interpretation.   CT PERC CHOLECYSTOSTOMY  Result Date: 08/24/2019 INDICATION: Acute cholecystitis. Poor operative candidate. Please perform image guided placement cholecystostomy tube for infection source control purposes As the interventional radiology suite is currently being utilized for a complex endoleak procedure, we will proceed with placement of the cholecystostomy tube with ultrasound and CT guidance to facilitate drainage. EXAM: ULTRASOUND AND FLUOROSCOPIC-GUIDED CHOLECYSTOSTOMY TUBE PLACEMENT COMPARISON:  CT abdomen pelvis-08/23/2019; nuclear medicine HIDA scan-08/23/2018 MEDICATIONS: The patient is currently admitted to the hospital and on intravenous antibiotics. Antibiotics were administered within an appropriate time frame prior to skin puncture. ANESTHESIA/SEDATION: Moderate (conscious) sedation was employed during this procedure. A total of Versed 0.5 mg and Fentanyl 12.5 mcg was administered intravenously. Moderate Sedation Time: 20 minutes. The patient's level of consciousness and vital signs were monitored continuously by radiology nursing throughout the procedure under my direct supervision. CONTRAST:  None FLUOROSCOPY TIME:  Not provided COMPLICATIONS: None immediate. PROCEDURE: Informed written consent was obtained from the patient's family after a discussion of the risks, benefits and alternatives to treatment.  Questions regarding the procedure were encouraged and answered. A timeout was performed prior to the initiation of the procedure. The right upper abdominal quadrant was prepped and draped in the usual sterile fashion, and a sterile drape was applied covering the operative field. Maximum barrier sterile technique with sterile gowns and gloves were used for the procedure. A timeout was performed prior to the initiation of the procedure. Local anesthesia was provided with 1% lidocaine with epinephrine. Ultrasound scanning of the right upper quadrant demonstrates a moderately dilated gallbladder with minimal amount of gallbladder wall thickening, however this is nonspecific finding in the setting known trace intra-abdominal ascites. Utilizing a transhepatic approach, an 18 gauge needle was advanced into the gallbladder under direct ultrasound guidance. An ultrasound image was saved for documentation purposes. Short Amplatz wire was coiled in gallbladder lumen. Appropriate positioning was confirmed with CT imaging. Next, the track was serially dilated allowing placement of a 10.2-French Cook cholecystomy tube into the gallbladder lumen where it was coiled and locked. Appropriate positioning was confirmed with CT imaging. Bile was aspirated and a small amount of aspirated bile was capped and sent to the laboratory for analysis. The catheter was secured to the skin with an interrupted suture, connected to a drainage bag and a dressing was applied. The patient tolerated the procedure well without immediate post procedural complication. IMPRESSION: Successful ultrasound and CT guided placement of a 10.2 French cholecystostomy tube. A small amount of aspirated bile was capped and sent to the laboratory for analysis Electronically Signed   By: Simonne Come M.D.   On: 08/24/2019 14:18   VAS [place] LOWER EXTREMITY VENOUS (DVT)  Result Date: 08/18/2019  Lower Venous DVTStudy Indications: Edema.  Limitations: Bandages, line and  ventilation. Comparison Study: No prior study on file Performing Technologist: Sherren Kerns RVS  Examination Guidelines: A complete evaluation includes B-mode imaging, spectral Doppler, color Doppler, and power Doppler as needed of all accessible portions of each vessel. Bilateral testing is considered an integral part of a complete examination. Limited examinations for reoccurring indications may be performed as noted. The reflux portion of the exam is performed with the patient in reverse Trendelenburg.  +---------+---------------+---------+-----------+----------+-------------------+ RIGHT    CompressibilityPhasicitySpontaneityPropertiesThrombus Aging      +---------+---------------+---------+-----------+----------+-------------------+ CFV  Not visualized      +---------+---------------+---------+-----------+----------+-------------------+ SFJ                                                   Not visualized      +---------+---------------+---------+-----------+----------+-------------------+ FV Prox  Full           Yes      Yes                                      +---------+---------------+---------+-----------+----------+-------------------+ FV Mid   Full                                                             +---------+---------------+---------+-----------+----------+-------------------+ FV DistalFull                                                             +---------+---------------+---------+-----------+----------+-------------------+ POP                     Yes      Yes                  patent by color and                                                       Doppler             +---------+---------------+---------+-----------+----------+-------------------+ PTV      Full                                                              +---------+---------------+---------+-----------+----------+-------------------+ PERO     Full                                                             +---------+---------------+---------+-----------+----------+-------------------+   +---------+---------------+---------+-----------+----------+-------------------+ LEFT     CompressibilityPhasicitySpontaneityPropertiesThrombus Aging      +---------+---------------+---------+-----------+----------+-------------------+ CFV                                                   Not visualized      +---------+---------------+---------+-----------+----------+-------------------+ SFJ  Not visualized      +---------+---------------+---------+-----------+----------+-------------------+ FV Prox                                               patent by color and                                                       Doppler             +---------+---------------+---------+-----------+----------+-------------------+ FV Mid                                                Not visualized      +---------+---------------+---------+-----------+----------+-------------------+ FV Distal                                             patent by color and                                                       Doppler             +---------+---------------+---------+-----------+----------+-------------------+ PFV                                                   Not visualized      +---------+---------------+---------+-----------+----------+-------------------+ POP                     Yes      Yes                  patent by color and                                                       Doppler             +---------+---------------+---------+-----------+----------+-------------------+ PTV      Full                                                              +---------+---------------+---------+-----------+----------+-------------------+ PERO     Full                                                             +---------+---------------+---------+-----------+----------+-------------------+  Summary: RIGHT: - There is no evidence of deep vein thrombosis in the lower extremity. However, portions of this examination were limited- see technologist comments above.  interstitial edema noted throughout  LEFT: - There is no evidence of deep vein thrombosis in the lower extremity. However, portions of this examination were limited- see technologist comments above.  Interstitial edema noted throughout.  *See table(s) above for measurements and observations. Electronically signed by Coral Else MD on 08/18/2019 at 6:02:04 PM.    Final    ECHOCARDIOGRAM LIMITED  Result Date: 08/15/2019    ECHOCARDIOGRAM LIMITED REPORT   Patient Name:   ZEPH RIEBEL Date of Exam: 08/15/2019 Medical Rec #:  [id number]  Height:       69.0 in Accession #:    [id number] Weight:       179.5 lb Date of Birth:  04/06/1955 BSA:          1.973 m Patient Age:    63 years   BP:           94/64 mmHg Patient Gender: M          HR:           89 bpm. 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 CHF, CAD, Prior CABG; Risk                 Factors:Hypertension, Dyslipidemia and Diabetes.  Sonographer:    Leta Jungling RDCS Referring Phys: [phone] [facility]  Sonographer Comments: Limited for Impella check IMPRESSIONS  1. Impellar device about 5.1 cm distal to the aortic annulus Severe decrease in LV function. FINDINGS  Additional Comments: Impellar device about 5.1 cm distal to the aortic annulus Severe decrease in LV function. Charlton Haws MD Electronically signed by Charlton Haws MD Signature Date/Time: 08/15/2019/10:05:45 AM    Final     ECHOCARDIOGRAM LIMITED  Result Date: 08/13/2019    ECHOCARDIOGRAM LIMITED REPORT   Patient Name:   MARWIN PRIMMER Date of Exam: 08/13/2019 Medical Rec #:  [id number]  Height:       69.0 in Accession #:    [id number] Weight:       184.3 lb Date of Birth:  1956/01/22 BSA:          1.995 m Patient Age:    63 years   BP:           113/61 mmHg Patient Gender: M          HR:           75 bpm. Exam Location:  Inpatient Procedure: Limited Echo Indications:    Impella placement  History:        Patient has prior history of Echocardiogram examinations, most                 recent 08/11/2019. CHF, CAD; Risk Factors:Diabetes, Dyslipidemia,                 Hypertension and Current Smoker.  Sonographer:    Renella Cunas RDCS Referring Phys: [id number] DALTON S MCLEAN IMPRESSIONS  1. Limited study to assess Impella placement. There is severe LV dysfunction with septal dyskinesis and apical akinesis. Impella is stable in position, 5.02 cm from aortic valve. FINDINGS  Left  Ventricle: Limited study to assess Impella placement. There is severe LV dysfunction with septal dyskinesis and apical akinesis. Impella is stable in position, 5.02 cm from aortic valve. Nona Dell MD Electronically signed by Nona Dell MD Signature Date/Time: 08/13/2019/11:38:58 AM    Final    ECHOCARDIOGRAM LIMITED  Result Date: 08/11/2019    ECHOCARDIOGRAM LIMITED REPORT   Patient Name:   MACLEAN FOISTER Date of Exam: 08/11/2019 Medical Rec #:  [id number]  Height:       69.0 in Accession #:    [id number] Weight:       175.5 lb Date of Birth:  1955-06-25 BSA:          1.954 m Patient Age:    63 years   BP:           88/52 mmHg Patient Gender: M          HR:           86 bpm. Exam Location:  Inpatient Procedure: Limited Echo STAT ECHO Indications:    Impella position  History:        Patient has prior history of Echocardiogram examinations, most                 recent 08/10/2019. CHF, CAD; Risk Factors:Hypertension, Diabetes                 and Dyslipidemia. Impella 5.5  placed 08/06/19.  Sonographer:    Irving Burton Senior RDCS Referring Phys: [id number] DANIEL R BENSIMHON  Sonographer Comments: Impella rep present at bedside. IMPRESSIONS  1. Limited study to assess impella placement; akinesis of the septum and apex with likely severe LV dysfunction; no pericardial effusion; doppler not performed; impella approximately 5 cm into LV from aortic valve. FINDINGS  Additional Comments: Limited study to assess impella placement; akinesis of the septum and apex with likely severe LV dysfunction; no pericardial effusion; doppler not performed; impella approximately 5 cm into LV from aortic valve. Olga Millers MD Electronically signed by Olga Millers MD Signature Date/Time: 08/11/2019/12:48:10 PM    Final    ECHOCARDIOGRAM LIMITED  Result Date: 08/09/2019    ECHOCARDIOGRAM LIMITED REPORT   Patient Name:   DEMAURION DICIOCCIO Date of Exam: 08/09/2019 Medical Rec #:  [id number]  Height:       69.0 in Accession #:    [id number] Weight:       209.4 lb Date of Birth:  Jan 01, 1956 BSA:          2.107 m Patient Age:    63 years   BP:           108/84 mmHg Patient Gender: M          HR:           101 bpm. Exam Location:  Inpatient Procedure: Limited Echo Indications:    Impella placement  History:        Patient has prior history of Echocardiogram examinations, most                 recent 08/07/2019. CHF, CAD; Risk Factors:Hypertension, Diabetes,                 Dyslipidemia and Current Smoker.  Sonographer:    Renella Cunas RDCS Referring Phys: [id number] DANIEL R BENSIMHON IMPRESSIONS  1. Study performed at the bedside to confirm Impella left ventricular support device placement.  2. Left ventricular ejection fraction, by estimation, is 20%. The left ventricle has severely decreased function. FINDINGS  Left Ventricle: Left  ventricular ejection fraction, by estimation, is 20%. The left ventricle has severely decreased function. Weston Brass MD Electronically signed by Weston Brass MD Signature Date/Time: 08/09/2019/5:23:44  PM    Final    ECHOCARDIOGRAM LIMITED  Result Date: 08/07/2019    ECHOCARDIOGRAM LIMITED REPORT   Patient Name:   CREEDENCE KUNESH Date of Exam: 08/07/2019 Medical Rec #:  [id number]  Height:       69.0 in Accession #:    [id number] Weight:       188.5 lb Date of Birth:  Jul 03, 1955 BSA:          2.015 m Patient Age:    63 years   BP:           84/56 mmHg Patient Gender: M          HR:           89 bpm. Exam Location:  Inpatient Procedure: Limited Echo and Limited Color Doppler STAT ECHO Indications:    impella placement check  History:        Patient has prior history of Echocardiogram examinations, most                 recent 08/06/2019.                  Limited study for left ventricular function and Impella cannula                 positioning.                  The left ventricle is normal in size. Left ventricular systolic                 function is severely depressed, with an estimated ejection                 fraction of 15-20%.                 The septum is severely dyskinetic, the apex is akinetic. Lateral                 wall contractility is relatively preserved.                  The right ventricle appears decompressed/underfilled. Right                 ventricular systolic function is probably moderately depressed.                  There is enhanced respiratory displacement of the ventricular                 septum (increased ventricular interdependence) that may                 represent a degree of constrictive or tamponade physiology. A                 septal "bounce" is seen, also suggestive of constrictive                 physiology. Further hemodynamic analysis is limited on this                 study.                  There is a small to moderate effusion anterior and lateral to                 the right ventricle, probably loculated.  There appears to be a                 small to moderate amount of circumferential thrombus in the                 pericardial cavity.                 An echogenic  structure in the anterior pericardial fluid may be                 a drain or epicardial pacing lead.                  There is a pacemaker wire or catheter in the right heart                 chambers.                 The Impella cannula is well positioned, approximately 5 cm                 across the aortic annulus.                 No aortic insufficiency is seen.  Sonographer:    Delcie Roch Referring Phys: [id number] Z ATKINS  Thurmon Fair MD Electronically signed by Thurmon Fair MD Signature Date/Time: 08/07/2019/8:45:55 AM    Final    [place] EKG SITE RITE  Result Date: 08/21/2019 If Site Rite image not attached, placement could not be confirmed due to current cardiac rhythm.  [place] EKG SITE RITE  Result Date: 08/12/2019 If Site Rite image not attached, placement could not be confirmed due to current cardiac rhythm.  US Abdomen Limited RUQ  Result Date: 08/16/2019 CLINICAL DATA:  Assess for cholecystitis EXAM: ULTRASOUND ABDOMEN LIMITED RIGHT UPPER QUADRANT COMPARISON:  CT abdomen pelvis 07/30/2014 FINDINGS: Gallbladder: Gallbladder the gallbladder contains biliary sludge and few echogenic, shadowing gallstones, largest measuring up to 6 mm in diameter. No pericholecystic fluid. Sonographic Eulah Pont sign is reportedly negative. Common bile duct: Diameter: 4.5 mm, nondilated Liver: Heterogeneous hepatic echogenicity within nodular surface contour. No focal liver lesions. Portal vein is patent on color Doppler imaging with normal direction of blood flow towards the liver. Other: Small volume ascites in the right upper quadrant. IMPRESSION: Heterogeneous, nodular liver most often compatible with cirrhosis. Cholelithiasis and biliary sludge. Mild wall thickening is nonspecific in the setting of intrinsic liver disease and ascites. No other sonographic features of acute cholecystitis. However, if there is persisting clinical concern, a HIDA could be obtained. Electronically Signed   By: Kreg Shropshire  M.D.   On: 08/16/2019 02:35    Labs:  CBC: Recent Labs    08/30/19 0939 08/30/19 2105 08/31/19 0230 09/01/19 0431 09/01/19 1600 09/02/19 0248  WBC 27.0*  --  27.0* 24.6*  --  19.2*  HGB 6.9*   < > 7.8* 8.3* 7.8* 7.9*  HCT 25.3*   < > 27.7* 29.0* 28.3* 27.6*  PLT 399  --  364 378  --  358   < > = values in this interval not displayed.    COAGS: Recent Labs    08/06/19 0340 08/06/19 0340 08/07/19 0347 08/07/19 1442 08/08/19 0302 08/20/19 1202 08/21/19 0406 08/30/19 0453 08/31/19 0230 09/01/19 0431 09/02/19 0248  INR 1.2  --  1.3* 1.2  --  1.6*  --   --   --   --   --   APTT 94*   < >  44*  --    < >  --    < > 73* 72* 64* 57*   < > = values in this interval not displayed.    BMP: Recent Labs    08/31/19 1635 09/01/19 0431 09/01/19 1600 09/02/19 0249  NA 149* 147* 149* 147*  K 4.1 3.9 4.6 4.0  CL 116* 115* 117* 116*  CO2 24 24 26 24   GLUCOSE 192* 181* 260* 81  BUN 74* 73* 80* 76*  CALCIUM 8.1* 8.1* 8.0* 8.0*  CREATININE 1.17 1.21 1.12 1.03  GFRNONAA >60 >60 >60 >60  GFRAA >60 >60 >60 >60    LIVER FUNCTION TESTS: Recent Labs    08/30/19 0513 08/30/19 1549 08/31/19 0230 08/31/19 0230 08/31/19 1635 09/01/19 0431 09/01/19 1600 09/02/19 0249  BILITOT 7.3*  --  7.9*  --   --  8.0*  --  7.5*  AST 91*  --  94*  --   --  118*  --  125*  ALT 106*  --  108*  --   --  119*  --  125*  ALKPHOS 350*  --  338*  --   --  378*  --  393*  PROT 5.7*  --  5.8*  --   --  6.0*  --  5.4*  ALBUMIN 1.9*   < > 1.9*   < > 1.8* 1.8* 1.7* 1.6*   < > = values in this interval not displayed.    TUMOR MARKERS: No results for input(s): AFPTM, CEA, CA199, CHROMGRNA in the last 8760 hours.  Assessment and Plan:  64 y/o M with complicated medical history and hospital course seen today for percutaneous gastrostomy placement consult as well as follow up of percutaneous cholecystostomy placed 08/24/19 in IR.  Patient somnolent, remains ventilated with bilateral chest tubes in  place. RUQ drain with dark brown bilious appearing output - per I/O 40 cc in last 24H, no issues with flushing, insertion site is unremarkable. Recommend continuing current drain care.  Patient history and imaging reviewed by Dr. Deanne Coffer who approves percutaneous gastrostomy placement - will tentatively plan for 7/27 or 7/28, patient will need to have bivalirudin gtt held x 4 hours pre procedure and may resume 2-3 hours post procedure if no overt bleeding concerns.   No family at bedside to discuss g-tube placement today, will attempt to contact family this week for consent.   Please call IR with questions or concerns.  Thank you for this interesting consult.  I greatly enjoyed meeting DEMICHAEL TRAUM and look forward to participating in their care.  A copy of this report was sent to the requesting provider on this date.  Electronically Signed: Villa Herb, PA-C 09/02/2019, 10:51 AM   I spent a total of 55 Mi[place]utes  in face to face in clinical consultation, greater than 50% of which was counseling/coordinating care for g tube placement and percutaneous cholecystostomy follow up.

## 2019-09-02 NOTE — Progress Notes (Signed)
Patient ID: Todd [name], male   DOB: 08-02-1955, 64 y.o.   MRN: [id number] TCTS DAILY ICU PROGRESS NOTE                   [address]            [place] [address]          [phone]   16 Days Post-Op Procedure(s) (LRB): REMOVAL OF IMPELLA LEFT VENTRICULAR ASSIST DEVICE (N/A) TRANSESOPHAGEAL ECHOCARDIOGRAM (TEE) (N/A) DATE OF PROCEDURE: 08/02/2019 PREOPERATIVE DIAGNOSIS: Severe 3-vessel coronary disease with ischemic cardiomyopathy. POSTOPERATIVE DIAGNOSIS: Severe 3-vessel coronary disease with ischemic cardiomyopathy. PROCEDURE:  Median sternotomy, extracorporeal circulation, Coronary artery bypass grafting x 4 Left internal mammary artery to left anterior descending, Saphenous vein graft to 1st diagonal, Saphenous vein graft to posterior descending, Left radial to obtuse marginal 1 Endoscopic vein harvestright thigh  Total Length of Stay:  LOS: 38 days   Subjective: Remains on ventilator, opens eyes slightly more responsive but does not follow commands  Objective: Vital signs in last 24 hours: Temp:  [98.5 F (36.9 C)-99.2 F (37.3 C)] 98.7 F (37.1 C) (07/25 0302) Pulse Rate:  [50-84] 78 (07/25 0500) Cardiac Rhythm: Normal sinus rhythm (07/25 0400) Resp:  [16-26] 20 (07/25 0500) BP: (97-129)/(44-83) 109/50 (07/25 0500) SpO2:  [94 %-100 %] 97 % (07/25 0742) Arterial Line BP: (54-165)/(25-61) 97/42 (07/25 0500) FiO2 (%):  [40 %] 40 % (07/25 0742) Weight:  [72 kg] 72 kg (07/25 0458)  Filed Weights   08/31/19 0500 09/01/19 0432 09/02/19 0458  Weight: 70.3 kg 68.9 kg 72 kg    Weight change: 3.1 kg   Hemodynamic parameters for last 24 hours: CVP:  [4 mmHg-20 mmHg] 6 mmHg  Intake/Output from previous day: 07/24 0701 - 07/25 0700 In: 1906.5 [I.V.:196.9; NG/GT:1155; IV Piggyback:554.6] Out: 2520 [Urine:1850; Drains:40; Chest Tube:630]  Intake/Output this shift: Total I/O In: 240 [NG/GT:240] Out: 250  [Urine:250]  Current Meds: Scheduled Meds: . amiodarone  200 mg Per Tube BID  . aspirin  81 mg Per Tube Daily  . chlorhexidine gluconate (MEDLINE KIT)  15 mL Mouth Rinse BID  . Chlorhexidine Gluconate Cloth  6 each Topical Daily  . cholestyramine  4 g Oral BID  . collagenase   Topical Daily  . docusate  200 mg Per Tube Daily  . feeding supplement (PROSource TF)  45 mL Per Tube Daily  . free water  100 mL Per Tube Q1H  . insulin aspart  0-24 Units Subcutaneous Q4H  . insulin aspart  5 Units Subcutaneous Q4H  . insulin detemir  12 Units Subcutaneous BID  . ipratropium  0.5 mg Nebulization TID BM  . levalbuterol  1.25 mg Nebulization TID BM  . lipase/protease/amylase)  20,880 Units Per Tube Once  . mouth rinse  15 mL Mouth Rinse 10 times per day  . midodrine  15 mg Per Tube TID WC  . nutrition supplement (JUVEN)  1 packet Per Tube BID BM  . sodium chloride flush  3 mL Intravenous Q12H  . sodium chloride flush  5 mL Intracatheter Q8H  . sodium chloride HYPERTONIC  4 mL Nebulization BID  . thiamine injection  100 mg Intravenous Daily   Continuous Infusions: . sodium chloride Stopped (08/13/19 1631)  . bivalirudin (ANGIOMAX) infusion 0.5 mg/mL (Non-ACS indications) 0.055 mg/kg/hr (09/02/19 0500)  . feeding supplement (PIVOT 1.5 CAL) 1,000 mL (08/31/19 1145)  . fluconazole (DIFLUCAN) IV 400 mg (09/02/19 0102)  . meropenem (MERREM) IV 200 mL/hr  at 09/02/19 0500  . norepinephrine (LEVOPHED) Adult infusion Stopped (09/01/19 1214)  . vancomycin Stopped (09/01/19 1055)   PRN Meds:.sodium chloride, acetaminophen (TYLENOL) oral liquid 160 mg/5 mL, dextrose, fentaNYL (SUBLIMAZE) injection, Lachell Rochette's butt cream, lip balm, metoprolol tartrate, ondansetron (ZOFRAN) IV, sodium chloride flush  General appearance: uncooperative and opens eyes but not to command  Neurologic: intact Heart: regular rate and rhythm Lungs: diminished breath sounds bibasilar Abdomen: soft, non-tender; bowel sounds  normal; no masses,  no organomegaly and drain in gall bladder Extremities: Extremities swollen, weeping serum, necrosis of tip of right index finger Wound: Sternum is stable  Lab Results: CBC: Recent Labs    09/01/19 0431 09/01/19 0431 09/01/19 1600 09/02/19 0248  WBC 24.6*  --   --  19.2*  HGB 8.3*   < > 7.8* 7.9*  HCT 29.0*   < > 28.3* 27.6*  PLT 378  --   --  358   < > = values in this interval not displayed.   BMET:  Recent Labs    09/01/19 1600 09/02/19 0249  NA 149* 147*  K 4.6 4.0  CL 117* 116*  CO2 26 24  GLUCOSE 260* 81  BUN 80* 76*  CREATININE 1.12 1.03  CALCIUM 8.0* 8.0*    CMET: Lab Results  Component Value Date   WBC 19.2 (H) 09/02/2019   HGB 7.9 (L) 09/02/2019   HCT 27.6 (L) 09/02/2019   PLT 358 09/02/2019   GLUCOSE 81 09/02/2019   CHOL 203 (H) 07/27/2019   TRIG 213 (H) 08/27/2019   HDL 34 (L) 07/27/2019   LDLCALC 148 (H) 07/27/2019   ALT 125 (H) 09/02/2019   AST 125 (H) 09/02/2019   NA 147 (H) 09/02/2019   K 4.0 09/02/2019   CL 116 (H) 09/02/2019   CREATININE 1.03 09/02/2019   BUN 76 (H) 09/02/2019   CO2 24 09/02/2019   TSH 2.243 07/31/2019   INR 1.6 (H) 08/20/2019   HGBA1C 9.0 (H) 07/26/2019      PT/INR: No results for input(s): LABPROT, INR in the last 72 hours. Radiology: [facility] Chest Port 1 View  Result Date: 09/02/2019 CLINICAL DATA:  Chest tube placement EXAM: PORTABLE CHEST 1 VIEW COMPARISON:  08/31/2019 FINDINGS: Tracheostomy tube tip is above the carina. Left IJ catheter tip projects over the cavoatrial junction. Feeding tube is in place with tip below the field of view. Bilateral chest tubes in place without pneumothorax. Stable changes from median sternotomy and CABG procedure. Cardiac enlargement, unchanged. Small to moderate left pleural effusion is unchanged. Diffuse interstitial opacities are identified compatible with pulmonary edema. Similar to previous exam. IMPRESSION: 1. No change in aeration to the lungs compared with  previous exam. 2. Stable support apparatus. 3. No pneumothorax. Electronically Signed   By: Kerby Moors M.D.   On: 09/02/2019 07:11     Assessment/Plan: S/P Procedure(s) (LRB): REMOVAL OF IMPELLA LEFT VENTRICULAR ASSIST DEVICE (N/A) TRANSESOPHAGEAL ECHOCARDIOGRAM (TEE) (N/A) No fever x 48 hours, wbc down to 19,000 Remains vent dependent Opens eyes but not following commands  Now off pressors Hepatic failure , but appears improving with bili down to 7 Remains on anticoagulation with Angiomax Remains on Diflucan for fungemia Back on vancomycin and Merrem pending-since restarting fever curve is improved  Grace Isaac 09/02/2019 8:10 AM

## 2019-09-02 NOTE — Progress Notes (Signed)
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 Consent for PEG placement obtained.

## 2019-09-02 NOTE — Progress Notes (Addendum)
Patient ID: Todd [name], male   DOB: 1955-11-20, 64 y.o.   MRN: [id number]     Advanced Heart Failure Rounding Note  PCP-Cardiologist: No primary care provider on file.   Subjective:    08/02/19 CABG 08/03/19 VF arrest. Chest open at bedside 08/03/19 Back to OR for ECMO cannulation 08/03/19 Washout out for tamponade 08/04/19 Repeat bedside washout for tamponade 08/06/19 To OR for decannulation and Impella 5.5 08/07/19 Underwent emergent bedside chest exploration at bedside for tamponade  08/10/19 Chest closed 08/16/19 HIT positive, bivalirudin begun 08/17/19 Impella removed 08/20/19 Tracheostomy 08/23/19 HIDA scan showed patent common bile duct (unable to visualize GB, unable to rule out acalculous cholecystitis).  08/24/19 cholecystostomy tube 08/30/19 chest CT suggestive of possible empyema 08/31/19 ECG with diffuse encephalopathy.  Off milrinone and NE.   Awake on trach. Failed vent wean this am. Seems to open eyes to command at times but won't follow other commands. Quickly falls back to sleep   Tm 99.2 overnight. Currently off lasix. Weight climbing. CVP 10-12   ABx/antifungals: Meropenem and [name] stopped 7/14 Zosyn stopped 7/21.   Candida from BAL and blood culture, started on Diflucan.   Now on fluconazole, vanc/meropenem (restarted 08/30/19)    Objective:   Weight Range: 72 kg Body mass index is 23.44 kg/m.   Vital Signs:   Temp:  [98.5 F (36.9 C)-99.2 F (37.3 C)] 98.7 F (37.1 C) (07/25 0302) Pulse Rate:  [50-84] 71 (07/25 0906) Resp:  [16-26] 22 (07/25 0906) BP: (97-129)/(44-83) 109/50 (07/25 0500) SpO2:  [94 %-100 %] 97 % (07/25 0742) Arterial Line BP: (54-165)/(25-61) 97/42 (07/25 0500) FiO2 (%):  [40 %] 40 % (07/25 0906) Weight:  [72 kg] 72 kg (07/25 0458) Last BM Date: 09/01/19  Weight change: Filed Weights   08/31/19 0500 09/01/19 0432 09/02/19 0458  Weight: 70.3 kg 68.9 kg 72 kg    Intake/Output:   Intake/Output Summary (Last 24 hours) at 09/02/2019  0911 Last data filed at 09/02/2019 0800 Gross per 24 hour  Intake 1889.95 ml  Output 2770 ml  Net -880.05 ml      Physical Exam    General:  Critically ill appearing. Jaundiced. Awake on vent through trach,  HEENT: normal + cor-track jaundiced + scleral icterus  Neck: supple. + trach JVP to jaw  Cor: PMI nondisplaced. Regular rate & rhythm. No rubs, gallops or murmurs. Incision sites ok  Lungs: coarse Abdomen: soft, nontender, nondistended. No hepatosplenomegaly. No bruits or masses. Good bowel sounds. + biliary drain  Extremities: no cyanosis, clubbing, rash, 2+ edema in thighs Neuro: Awake. Not following commands for me. LUE seems flaccid   Telemetry   NSR 70-80s Personally reviewed   Labs    CBC Recent Labs    09/01/19 0431 09/01/19 0431 09/01/19 1600 09/02/19 0248  WBC 24.6*  --   --  19.2*  NEUTROABS 21.4*  --   --  16.5*  HGB 8.3*   < > 7.8* 7.9*  HCT 29.0*   < > 28.3* 27.6*  MCV 105.8*  --   --  108.2*  PLT 378  --   --  358   < > = values in this interval not displayed.   Basic Metabolic Panel Recent Labs    08/31/19 0230 08/31/19 0230 08/31/19 1635 09/01/19 0431 09/01/19 1600 09/02/19 0249  NA 149*   < > 149*   < > 149* 147*  K 4.4   < > 4.1   < > 4.6 4.0  CL  116*   < > 116*   < > 117* 116*  CO2 23   < > 24   < > 26 24  GLUCOSE 249*   < > 192*   < > 260* 81  BUN 71*   < > 74*   < > 80* 76*  CREATININE 1.34*   < > 1.17   < > 1.12 1.03  CALCIUM 8.3*   < > 8.1*   < > 8.0* 8.0*  MG 2.5*  --   --   --   --   --   PHOS 4.4   < > 4.8*  --  4.1  --    < > = values in this interval not displayed.   Liver Function Tests Recent Labs    09/01/19 0431 09/01/19 0431 09/01/19 1600 09/02/19 0249  AST 118*  --   --  125*  ALT 119*  --   --  125*  ALKPHOS 378*  --   --  393*  BILITOT 8.0*  --   --  7.5*  PROT 6.0*  --   --  5.4*  ALBUMIN 1.8*   < > 1.7* 1.6*   < > = values in this interval not displayed.   No results for input(s): LIPASE, AMYLASE  in the last 72 hours. Cardiac Enzymes No results for input(s): CKTOTAL, CKMB, CKMBINDEX, TROPONINI in the last 72 hours.  BNP: BNP (last 3 results) Recent Labs    07/26/19 1236  BNP 2,568.2*    ProBNP (last 3 results) No results for input(s): PROBNP in the last 8760 hours.   D-Dimer No results for input(s): DDIMER in the last 72 hours. Hemoglobin A1C No results for input(s): HGBA1C in the last 72 hours. Fasting Lipid Panel No results for input(s): CHOL, HDL, LDLCALC, TRIG, CHOLHDL, LDLDIRECT in the last 72 hours. Thyroid Function Tests No results for input(s): TSH, T4TOTAL, T3FREE, THYROIDAB in the last 72 hours.  Invalid input(s): FREET3  Other results:   Imaging    DG Chest Port 1 View  Result Date: 09/02/2019 CLINICAL DATA:  Chest tube placement EXAM: PORTABLE CHEST 1 VIEW COMPARISON:  08/31/2019 FINDINGS: Tracheostomy tube tip is above the carina. Left IJ catheter tip projects over the cavoatrial junction. Feeding tube is in place with tip below the field of view. Bilateral chest tubes in place without pneumothorax. Stable changes from median sternotomy and CABG procedure. Cardiac enlargement, unchanged. Small to moderate left pleural effusion is unchanged. Diffuse interstitial opacities are identified compatible with pulmonary edema. Similar to previous exam. IMPRESSION: 1. No change in aeration to the lungs compared with previous exam. 2. Stable support apparatus. 3. No pneumothorax. Electronically Signed   By: Kerby Moors M.D.   On: 09/02/2019 07:11     Medications:     Scheduled Medications: . amiodarone  200 mg Per Tube BID  . aspirin  81 mg Per Tube Daily  . chlorhexidine gluconate (MEDLINE KIT)  15 mL Mouth Rinse BID  . Chlorhexidine Gluconate Cloth  6 each Topical Daily  . cholestyramine  4 g Oral BID  . collagenase   Topical Daily  . feeding supplement (PROSource TF)  45 mL Per Tube Daily  . free water  100 mL Per Tube Q1H  . insulin aspart  0-24  Units Subcutaneous Q4H  . insulin aspart  3 Units Subcutaneous Q4H  . insulin detemir  12 Units Subcutaneous BID  . ipratropium  0.5 mg Nebulization TID BM  . levalbuterol  1.25 mg Nebulization TID BM  . lipase/protease/amylase)  20,880 Units Per Tube Once  . mouth rinse  15 mL Mouth Rinse 10 times per day  . midodrine  15 mg Per Tube TID WC  . nutrition supplement (JUVEN)  1 packet Per Tube BID BM  . sodium chloride flush  3 mL Intravenous Q12H  . sodium chloride flush  5 mL Intracatheter Q8H  . sodium chloride HYPERTONIC  4 mL Nebulization BID  . thiamine injection  100 mg Intravenous Daily    Infusions: . sodium chloride Stopped (08/13/19 1631)  . bivalirudin (ANGIOMAX) infusion 0.5 mg/mL (Non-ACS indications) 0.055 mg/kg/hr (09/02/19 0500)  . feeding supplement (PIVOT 1.5 CAL) 1,000 mL (08/31/19 1145)  . fluconazole (DIFLUCAN) IV 400 mg (09/02/19 0102)  . meropenem (MERREM) IV 200 mL/hr at 09/02/19 0500  . vancomycin Stopped (09/01/19 1055)    PRN Medications: sodium chloride, acetaminophen (TYLENOL) oral liquid 160 mg/5 mL, dextrose, fentaNYL (SUBLIMAZE) injection, Gerhardt's butt cream, lip balm, metoprolol tartrate, ondansetron (ZOFRAN) IV, sodium chloride flush   Assessment/Plan   1. Shock: Mixed cardiogenic/septic.  Echo with EF 20-25%, moderate RV dysfunction.  VA ECMO post-VF arrest, cannulated 6/25.  Stable s/p return to OR for mediastinal re-exploration due to bleeding on 6/25.  Decannulated from [place] ECMO on 08/06/19. Impella 5.5 placed. Underwent emergent chest re-exploration on 6/29 for tamponade and clot removal. Chest closed 7/2.  Impella 5.5 removed 7/9.  Milrinone stopped 7/23 NE stopped onvernight Suspect primarily septic/vasodilatory shock with fever, elevated WBCs. Co-ox 67% - Off lasix. CVP climbing. 2-3+ edema on exam. Give lasix 80 IV bid today - Continue midodrine 15 mg tid 2. 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   Prolonged VF arrest on 6/25. Chest closed 7/2.  - ASA  - Holding Crestor with elevated LFTs.  3. Cardiac arrest: VF arrest with prolonged code. Episodes of VT/NSVT in setting of low K (difficult to effectively replace). K now stable, minimal ectopy.  - Continue PO amio 200 mg bid.   - Can consider lifevest at d/cbut doubt condition will permit - Keep K > 4.0 Mg > 2.0  - No change 4. Acute hypoxemic respiratory failure: In setting of cardiac arrest.  H/o COPD.  Bibasilar infiltrates, possible HCAP.  Now with tracheostomy. Diflucan with candida in BAL and blood culture.  More rhonchorous on exam.  - tracheal aspirate re cultured yesterday. NGTD  - Vanc + meropenum restarted 7/23 - Chest CT 7/23 w/ concerns for ? Developing empyema.  - CCM following and have discussed further CT drainage with TCTS but have deferred 5. Acute blood loss anemia: Now s/p mediastinal re-exploration x 3. Hgb stable at 7.9 today.  - Now on bivalirudin gtt. - Monitor. 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  Platelets have recovered.  7. ID: Suspect HCAP initially, on meropenem/anidulafungin/vanco initially with ongoing fevers and leukocytosis. Venous dopplers negative for DVT. Possible drug fever from meropenem, he was switched to Zosyn 7/14 - 7/21.  He had abdominal US with gallbladder sludge, no definite cholecystitis.  HIDA scan done, GB does not opacify => ?acalculous cholecystitis => now with cholecystostomy tube.  Most recently, Candida in blood culture and BAL, he is on Diflucan. Exam by ophthalmology => no candidal endophthalmitis.  Foley and PICC removed 7/21.  WBCs falling 27 -> 25 -> 19.2 K. Tm 99.2 currently. - ID following.  - C.diff negative - Currently on Diflucan. Vanc +  meropenum restarted 7/22 -  tracheal aspirate re cultured yesterday. NGTD  - Chest CT w/ concerns for ? Developing empyema. CCM and TCTS following 8. Elevated bilirubin: Mostly direct = likely  cholestatic/shock liver/RV failure predominantly.   Abdominal US with evidence for cirrhosis, sludge in GB but no definite acute cholecystitis. NH3 was not significantly elevated. Tbili 7.5 today (8.0 yesterday). HIDA scan showed patent common bile duct, so not obstructive stone causing elevated bilirubin.  9. Ileus: Improved.  Getting TFs.  10. Atrial fibrillation/flutter: Paroxysmal. In NSR today.     - Continue PO amio, 200 mg bid  - bivalirudin gtt. No bleeding 11. Neuro: CT head negative on 7/11. EEG with e/o diffuse encephalopathy.  - seems to lose eyes intermittently on command. O/w no meaningful interaction 12. Hypernatremia: Na stable at 147 today.  - Continue free water boluses 300 cc q4 hrs.   Situation concerning. Unsure we can have meaningful outcome here for him. Continue current plan for now. TCTS and CCM discussing PEG and LTACH transfer.   CRITICAL CARE Performed by: Glori Bickers  Total critical care time: 35 minutes  Critical care time was exclusive of separately billable procedures and treating other patients.  Critical care was necessary to treat or prevent imminent or life-threatening deterioration.  Critical care was time spent personally by me (independent of midlevel providers or residents) on the following activities: development of treatment plan with patient and/or surrogate as well as nursing, discussions with consultants, evaluation of patient's response to treatment, examination of patient, obtaining history from patient or surrogate, ordering and performing treatments and interventions, ordering and review of laboratory studies, ordering and review of radiographic studies, pulse oximetry and re-evaluation of patient's condition.    Glori Bickers, MD 09/02/2019 9:11 AM

## 2019-09-02 NOTE — Progress Notes (Signed)
Subjective:  Patient not responding to me today  Antibiotics:  Anti-infectives (From admission, onward)   Start     Dose/Rate Route Frequency Ordered Stop   08/31/19 1000  vancomycin (VANCOREADY) IVPB 1250 mg/250 mL     Discontinue     1,250 mg 166.7 mL/hr over 90 Minutes Intravenous Every 24 hours 08/30/19 1323     08/30/19 0930  meropenem (MERREM) 1 g in sodium chloride 0.9 % 100 mL IVPB     Discontinue     1 g 200 mL/hr over 30 Minutes Intravenous Every 8 hours 08/30/19 0917     08/30/19 0930  vancomycin (VANCOREADY) IVPB 1500 mg/300 mL        1,500 mg 150 mL/hr over 120 Minutes Intravenous  Once 08/30/19 0918 08/30/19 1152   08/25/19 0200  fluconazole (DIFLUCAN) IVPB 400 mg     Discontinue     400 mg 100 mL/hr over 120 Minutes Intravenous Every 24 hours 08/25/19 0146     08/22/19 1400  piperacillin-tazobactam (ZOSYN) IVPB 3.375 g  Status:  Discontinued        3.375 g 12.5 mL/hr over 240 Minutes Intravenous Every 8 hours 08/22/19 1032 08/29/19 1021   08/20/19 2200  vancomycin (VANCOREADY) IVPB 750 mg/150 mL  Status:  Discontinued        750 mg 150 mL/hr over 60 Minutes Intravenous Every 24 hours 08/19/19 1623 08/20/19 0758   08/20/19 0900  vancomycin (VANCOREADY) IVPB 750 mg/150 mL  Status:  Discontinued        750 mg 150 mL/hr over 60 Minutes Intravenous Every 24 hours 08/20/19 0758 08/21/19 0852   08/16/19 1400  vancomycin (VANCOCIN) IVPB 1000 mg/200 mL premix  Status:  Discontinued        1,000 mg 200 mL/hr over 60 Minutes Intravenous Every 24 hours 08/16/19 1108 08/19/19 1623   08/16/19 0745  anidulafungin ([name]) 100 mg in sodium chloride 0.9 % 100 mL IVPB  Status:  Discontinued       "Followed by" Linked Group Details   100 mg 78 mL/hr over 100 Minutes Intravenous Every 24 hours 08/15/19 0741 08/22/19 1032   08/15/19 0730  anidulafungin ([name]) 200 mg in sodium chloride 0.9 % 200 mL IVPB       "Followed by" Linked Group Details   200 mg 78 mL/hr over 200  Minutes Intravenous  Once 08/15/19 0741 08/15/19 1346   08/13/19 1400  meropenem (MERREM) 1 g in sodium chloride 0.9 % 100 mL IVPB  Status:  Discontinued        1 g 200 mL/hr over 30 Minutes Intravenous Every 8 hours 08/13/19 1236 08/22/19 1032   08/13/19 1400  vancomycin (VANCOREADY) IVPB 1500 mg/300 mL  Status:  Discontinued        1,500 mg 150 mL/hr over 120 Minutes Intravenous Every 24 hours 08/13/19 1244 08/16/19 1108   08/10/19 1504  vancomycin (VANCOCIN) 1,000 mg in sodium chloride 0.9 % 1,000 mL irrigation  Status:  Discontinued          As needed 08/10/19 1505 08/10/19 1840   08/10/19 1500  vancomycin (VANCOCIN) 1,000 mg in sodium chloride 0.9 % 1,000 mL irrigation  Status:  Discontinued         Irrigation To Surgery 08/10/19 1448 08/10/19 1453   08/10/19 1500  vancomycin (VANCOCIN) 1,000 mg in sodium chloride 0.9 % 1,000 mL irrigation  Status:  Discontinued         Irrigation To  Surgery 08/10/19 1454 08/10/19 1855   08/10/19 0800  vancomycin (VANCOREADY) IVPB 1750 mg/350 mL        1,750 mg 175 mL/hr over 120 Minutes Intravenous Every 36 hours 08/09/19 1426 08/11/19 2356   08/08/19 2232  vancomycin variable dose per unstable renal function (pharmacist dosing)  Status:  Discontinued         Does not apply See admin instructions 08/08/19 2232 08/09/19 1426   08/06/19 1827  vancomycin (VANCOCIN) powder  Status:  Discontinued          As needed 08/06/19 1828 08/06/19 1936   08/06/19 1503  vancomycin (VANCOCIN) 1,000 mg in sodium chloride 0.9 % 1,000 mL irrigation  Status:  Discontinued          As needed 08/06/19 1504 08/06/19 1936   08/04/19 2100  vancomycin (VANCOCIN) IVPB 1000 mg/200 mL premix  Status:  Discontinued        1,000 mg 200 mL/hr over 60 Minutes Intravenous Every 12 hours 08/04/19 0721 08/08/19 2225   08/04/19 0730  vancomycin (VANCOREADY) IVPB 2000 mg/400 mL        2,000 mg 200 mL/hr over 120 Minutes Intravenous  Once 08/04/19 0721 08/04/19 1003   08/04/19 0730   meropenem (MERREM) 1 g in sodium chloride 0.9 % 100 mL IVPB        1 g 200 mL/hr over 30 Minutes Intravenous Every 8 hours 08/04/19 0721 08/12/19 2220   08/02/19 2030  vancomycin (VANCOCIN) IVPB 1000 mg/200 mL premix        1,000 mg 200 mL/hr over 60 Minutes Intravenous  Once 08/02/19 1516 08/02/19 2151   08/02/19 1530  cefUROXime (ZINACEF) 1.5 g in sodium chloride 0.9 % 100 mL IVPB        1.5 g 200 mL/hr over 30 Minutes Intravenous Every 12 hours 08/02/19 1516 08/04/19 0430   08/02/19 0400  vancomycin (VANCOREADY) IVPB 1250 mg/250 mL        1,250 mg 166.7 mL/hr over 90 Minutes Intravenous To Surgery 08/01/19 1422 08/02/19 0923   08/02/19 0400  cefUROXime (ZINACEF) 1.5 g in sodium chloride 0.9 % 100 mL IVPB        1.5 g 200 mL/hr over 30 Minutes Intravenous To Surgery 08/01/19 1422 08/02/19 0853   08/02/19 0400  cefUROXime (ZINACEF) 750 mg in sodium chloride 0.9 % 100 mL IVPB        750 mg 200 mL/hr over 30 Minutes Intravenous To Surgery 08/01/19 1422 08/02/19 1430   08/01/19 0400  vancomycin (VANCOREADY) IVPB 1250 mg/250 mL  Status:  Discontinued        1,250 mg 166.7 mL/hr over 90 Minutes Intravenous To Surgery 07/31/19 1034 08/01/19 1421   08/01/19 0400  cefUROXime (ZINACEF) 1.5 g in sodium chloride 0.9 % 100 mL IVPB  Status:  Discontinued        1.5 g 200 mL/hr over 30 Minutes Intravenous To Surgery 07/31/19 1034 08/01/19 1421   08/01/19 0400  cefUROXime (ZINACEF) 750 mg in sodium chloride 0.9 % 100 mL IVPB  Status:  Discontinued        750 mg 200 mL/hr over 30 Minutes Intravenous To Surgery 07/31/19 1034 08/01/19 1421      Medications: Scheduled Meds: . amiodarone  200 mg Per Tube BID  . aspirin  81 mg Per Tube Daily  . chlorhexidine gluconate (MEDLINE KIT)  15 mL Mouth Rinse BID  . Chlorhexidine Gluconate Cloth  6 each Topical Daily  . cholestyramine  4 g  Oral BID  . collagenase   Topical Daily  . feeding supplement (PROSource TF)  45 mL Per Tube Daily  . free water  100  mL Per Tube Q1H  . furosemide  80 mg Intravenous Once  . furosemide  80 mg Intravenous Q12H  . insulin aspart  0-24 Units Subcutaneous Q4H  . insulin aspart  3 Units Subcutaneous Q4H  . insulin detemir  12 Units Subcutaneous BID  . ipratropium  0.5 mg Nebulization TID BM  . levalbuterol  1.25 mg Nebulization TID BM  . lipase/protease/amylase)  20,880 Units Per Tube Once  . mouth rinse  15 mL Mouth Rinse 10 times per day  . midodrine  15 mg Per Tube TID WC  . nutrition supplement (JUVEN)  1 packet Per Tube BID BM  . potassium chloride  40 mEq Oral BID  . sodium chloride flush  3 mL Intravenous Q12H  . sodium chloride flush  5 mL Intracatheter Q8H  . sodium chloride HYPERTONIC  4 mL Nebulization BID  . thiamine injection  100 mg Intravenous Daily   Continuous Infusions: . sodium chloride Stopped (08/13/19 1631)  . bivalirudin (ANGIOMAX) infusion 0.5 mg/mL (Non-ACS indications) 0.055 mg/kg/hr (09/02/19 1000)  . feeding supplement (PIVOT 1.5 CAL) 1,000 mL (08/31/19 1145)  . fluconazole (DIFLUCAN) IV Stopped (09/02/19 0302)  . meropenem (MERREM) IV Stopped (09/02/19 0528)  . vancomycin 166.7 mL/hr at 09/02/19 1000   PRN Meds:.sodium chloride, acetaminophen (TYLENOL) oral liquid 160 mg/5 mL, dextrose, fentaNYL (SUBLIMAZE) injection, Gerhardt's butt cream, lip balm, metoprolol tartrate, ondansetron (ZOFRAN) IV, sodium chloride flush    Objective: Weight change: 3.1 kg  Intake/Output Summary (Last 24 hours) at 09/02/2019 1333 Last data filed at 09/02/2019 1000 Gross per 24 hour  Intake 2039.66 ml  Output 2470 ml  Net -430.34 ml   Blood pressure (!) 116/47, pulse 92, temperature 99 F (37.2 C), temperature source Oral, resp. rate 22, height _0  (1.753 m), weight 72 kg, SpO2 99 %. Temp:  [98.5 F (36.9 C)-99.2 F (37.3 C)] 99 F (37.2 C) (07/25 1125) Pulse Rate:  [50-92] 92 (07/25 1147) Resp:  [16-26] 22 (07/25 1147) BP: (97-129)/(44-83) 116/47 (07/25 1000) SpO2:  [94 %-100 %]  99 % (07/25 1147) Arterial Line BP: (54-165)/(25-61) 102/41 (07/25 1000) FiO2 (%):  [40 %] 40 % (07/25 1147) Weight:  [72 kg] 72 kg (07/25 0458)  Physical Exam: General:confused HEENT: icteric sclera, EOMI CVS regular rate, no murmurs gallops rubs Chest: , Rhonchorous Abdomen: Biliary drain with bilious material Extremities: Jaundiced  Biliary drain in place central lines in place Neuro: less interactive today  CBC:    BMET Recent Labs    09/01/19 1600 09/02/19 0249  NA 149* 147*  K 4.6 4.0  CL 117* 116*  CO2 26 24  GLUCOSE 260* 81  BUN 80* 76*  CREATININE 1.12 1.03  CALCIUM 8.0* 8.0*     Liver Panel  Recent Labs    09/01/19 0431 09/01/19 0431 09/01/19 1600 09/02/19 0249  PROT 6.0*  --   --  5.4*  ALBUMIN 1.8*   < > 1.7* 1.6*  AST 118*  --   --  125*  ALT 119*  --   --  125*  ALKPHOS 378*  --   --  393*  BILITOT 8.0*  --   --  7.5*   < > = values in this interval not displayed.       Sedimentation Rate No results for input(s): ESRSEDRATE in the last  72 hours. C-Reactive Protein No results for input(s): CRP in the last 72 hours.  Micro Results: Recent Results (from the past 720 hour(s))  Culture, respiratory (non-expectorated)     Status: None   Collection Time: 08/11/19  3:32 PM   Specimen: Tracheal Aspirate; Respiratory  Result Value Ref Range Status   Specimen Description TRACHEAL ASPIRATE  Final   Special Requests Normal  Final   Gram Stain   Final    NO WBC SEEN NO SQUAMOUS EPITHELIAL CELLS SEEN RARE BUDDING YEAST SEEN Performed at [facility], [address], [place], [place] [address]    Culture FEW CANDIDA TROPICALIS  Final   Report Status 08/13/2019 FINAL  Final  Culture, blood (Routine X 2) w Reflex to ID Panel     Status: None   Collection Time: 08/13/19  8:27 AM   Specimen: BLOOD  Result Value Ref Range Status   Specimen Description BLOOD BLOOD LEFT ARM  Final   Special Requests   Final    BOTTLES DRAWN AEROBIC AND  ANAEROBIC Blood Culture adequate volume   Culture   Final    NO GROWTH 5 DAYS Performed at [facility], [place] [address], [place], [place] [address]    Report Status 08/18/2019 FINAL  Final  Culture, blood (Routine X 2) w Reflex to ID Panel     Status: None   Collection Time: 08/13/19  8:31 AM   Specimen: BLOOD  Result Value Ref Range Status   Specimen Description BLOOD BLOOD LEFT HAND  Final   Special Requests   Final    BOTTLES DRAWN AEROBIC AND ANAEROBIC Blood Culture adequate volume   Culture   Final    NO GROWTH 5 DAYS Performed at [facility], [place] [address] [place], [place] [address]    Report Status 08/18/2019 FINAL  Final  Culture, respiratory (non-expectorated)     Status: None   Collection Time: 08/13/19  4:12 PM   Specimen: Tracheal Aspirate; Respiratory  Result Value Ref Range Status   Specimen Description TRACHEAL ASPIRATE  Final   Special Requests NONE  Final   Gram Stain   Final    NO WBC SEEN FEW YEAST Performed at [facility], [place] [address] [place], [place] [address]    Culture FEW CANDIDA TROPICALIS  Final   Report Status 08/16/2019 FINAL  Final  Culture, Urine     Status: None   Collection Time: 08/15/19  6:13 PM   Specimen: Urine, Catheterized  Result Value Ref Range Status   Specimen Description URINE, CATHETERIZED  Final   Special Requests NONE  Final   Culture   Final    NO GROWTH Performed at [facility], [address], [place], [place] [address]    Report Status 08/17/2019 FINAL  Final  Culture, blood (routine x 2)     Status: None   Collection Time: 08/15/19  7:53 PM   Specimen: BLOOD RIGHT HAND  Result Value Ref Range Status   Specimen Description BLOOD RIGHT HAND  Final   Special Requests   Final    BOTTLES DRAWN AEROBIC AND ANAEROBIC Blood Culture adequate volume   Culture   Final    NO GROWTH 5 DAYS Performed at [facility], [place] [address], [place], [place] [address]    Report Status 08/20/2019  FINAL  Final  Culture, blood (routine x 2)     Status: None   Collection Time: 08/15/19  7:53 PM   Specimen: BLOOD RIGHT HAND  Result Value Ref Range Status   Specimen Description BLOOD RIGHT HAND  Final   Special Requests   Final    BOTTLES DRAWN AEROBIC AND ANAEROBIC Blood Culture adequate volume   Culture   Final    NO GROWTH 5 DAYS Performed at [facility], [address], [place], [place] [address]    Report Status 08/20/2019 FINAL  Final  Aerobic/Anaerobic Culture (surgical/deep wound)     Status: None   Collection Time: 08/17/19  2:32 PM   Specimen: Wound  Result Value Ref Range Status   Specimen Description WOUND  Final   Special Requests NONE  Final   Gram Stain NO WBC SEEN NO ORGANISMS SEEN   Final   Culture   Final    No growth aerobically or anaerobically. Performed at [facility], [place] [address], [place], [place] [address]    Report Status 08/22/2019 FINAL  Final  Culture, fungus without smear     Status: Abnormal (Preliminary result)   Collection Time: 08/20/19  2:00 PM   Specimen: Bronchoalveolar Lavage; Other  Result Value Ref Range Status   Specimen Description BRONCHIAL ALVEOLAR LAVAGE  Final   Special Requests   Final    Normal Performed at [facility], [place] [address], [place], [place] [address]    Culture CANDIDA ALBICANS (A)  Final   Report Status PENDING  Incomplete  Culture, bal-quantitative     Status: Abnormal   Collection Time: 08/20/19  2:00 PM   Specimen: Bronchoalveolar Lavage; Respiratory  Result Value Ref Range Status   Specimen Description BRONCHIAL ALVEOLAR LAVAGE  Final   Special Requests NONE  Final   Gram Stain   Final    FEW WBC PRESENT, PREDOMINANTLY PMN NO ORGANISMS SEEN Performed at [facility], [address], [place], [place] [address]    Culture (A)  Final    4,000 COLONIES/mL CANDIDA TROPICALIS 3,000 COLONIES/mL CANDIDA ALBICANS    Report Status 08/25/2019 FINAL  Final  Body fluid culture      Status: None   Collection Time: 08/22/19  9:14 AM   Specimen: Pleura; Body Fluid  Result Value Ref Range Status   Specimen Description PLEURAL  Final   Special Requests NONE  Final   Gram Stain   Final    NO WBC SEEN NO ORGANISMS SEEN Performed at [facility], [address] [place], [place] [address]    Culture RARE CANDIDA PARAPSILOSIS  Final   Report Status 08/25/2019 FINAL  Final  Culture, blood (routine x 2)     Status: Abnormal   Collection Time: 08/22/19 12:58 PM   Specimen: BLOOD RIGHT HAND  Result Value Ref Range Status   Specimen Description BLOOD RIGHT HAND  Final   Special Requests   Final    BOTTLES DRAWN AEROBIC ONLY Blood Culture adequate volume   Culture  Setup Time   Final    AEROBIC BOTTLE ONLY YEAST CRITICAL RESULT CALLED TO, READ BACK BY AND VERIFIED WITH: Karsten Ro [facility] 08/25/19 0123 JDW Performed at [facility], [place] [address], [place], [place] [address]    Culture CANDIDA PARAPSILOSIS (A)  Final   Report Status 08/26/2019 FINAL  Final  Culture, blood (routine x 2)     Status: None   Collection Time: 08/22/19 12:58 PM   Specimen: BLOOD RIGHT HAND  Result Value Ref Range Status   Specimen Description  BLOOD RIGHT HAND  Final   Special Requests   Final    BOTTLES DRAWN AEROBIC ONLY Blood Culture adequate volume   Culture   Final    NO GROWTH 5 DAYS Performed at [facility], [address], [place], [place] [address]    Report Status 08/27/2019 FINAL  Final  Blood Culture ID Panel (Reflexed)     Status: Abnormal   Collection Time: 08/22/19 12:58 PM  Result Value Ref Range Status   Enterococcus species NOT DETECTED NOT DETECTED Final   Listeria monocytogenes NOT DETECTED NOT DETECTED Final   Staphylococcus species NOT DETECTED NOT DETECTED Final   Staphylococcus aureus (BCID) NOT DETECTED NOT DETECTED Final   Streptococcus species NOT DETECTED NOT DETECTED Final   Streptococcus agalactiae NOT DETECTED NOT DETECTED Final    Streptococcus pneumoniae NOT DETECTED NOT DETECTED Final   Streptococcus pyogenes NOT DETECTED NOT DETECTED Final   Acinetobacter baumannii NOT DETECTED NOT DETECTED Final   Enterobacteriaceae species NOT DETECTED NOT DETECTED Final   Enterobacter cloacae complex NOT DETECTED NOT DETECTED Final   Escherichia coli NOT DETECTED NOT DETECTED Final   Klebsiella oxytoca NOT DETECTED NOT DETECTED Final   Klebsiella pneumoniae NOT DETECTED NOT DETECTED Final   Proteus species NOT DETECTED NOT DETECTED Final   Serratia marcescens NOT DETECTED NOT DETECTED Final   Haemophilus influenzae NOT DETECTED NOT DETECTED Final   Neisseria meningitidis NOT DETECTED NOT DETECTED Final   Pseudomonas aeruginosa NOT DETECTED NOT DETECTED Final   Candida albicans NOT DETECTED NOT DETECTED Final   Candida glabrata NOT DETECTED NOT DETECTED Final   Candida krusei NOT DETECTED NOT DETECTED Final   Candida parapsilosis DETECTED (A) NOT DETECTED Final    Comment: CRITICAL RESULT CALLED TO, READ BACK BY AND VERIFIED WITH: J LEDFORD PHARMD 08/25/19 0123 JDW    Candida tropicalis NOT DETECTED NOT DETECTED Final    Comment: Performed at [facility], [address] [place], [place] [address]  Aerobic/Anaerobic Culture (surgical/deep wound)     Status: None   Collection Time: 08/24/19  1:35 PM   Specimen: Abscess  Result Value Ref Range Status   Specimen Description ABSCESS  Final   Special Requests DRAIN  Final   Gram Stain   Final    RARE WBC PRESENT, PREDOMINANTLY PMN NO ORGANISMS SEEN    Culture   Final    No growth aerobically or anaerobically. Performed at [facility], [place] [address], [place], [place] [address]    Report Status 08/29/2019 FINAL  Final  Culture, blood (routine x 2)     Status: None   Collection Time: 08/25/19  3:53 PM   Specimen: BLOOD RIGHT HAND  Result Value Ref Range Status   Specimen Description BLOOD RIGHT HAND  Final   Special Requests   Final    BOTTLES DRAWN  AEROBIC AND ANAEROBIC Blood Culture adequate volume   Culture   Final    NO GROWTH 5 DAYS Performed at [facility], [place] [address], [place], [place] [address]    Report Status 08/30/2019 FINAL  Final  Culture, blood (routine x 2)     Status: None   Collection Time: 08/25/19  3:53 PM   Specimen: BLOOD RIGHT ARM  Result Value Ref Range Status   Specimen Description BLOOD RIGHT ARM  Final   Special Requests   Final    BOTTLES DRAWN AEROBIC ONLY Blood Culture adequate volume   Culture   Final  NO GROWTH 5 DAYS Performed at [facility], [place] [address] [place], [place] [address]    Report Status 08/30/2019 FINAL  Final  Culture, blood (routine x 2)     Status: None (Preliminary result)   Collection Time: 08/29/19  9:28 AM   Specimen: BLOOD RIGHT HAND  Result Value Ref Range Status   Specimen Description BLOOD RIGHT HAND  Final   Special Requests   Final    BOTTLES DRAWN AEROBIC ONLY Blood Culture adequate volume   Culture   Final    NO GROWTH 4 DAYS Performed at [facility], [place] [address], [place], [place] [address]    Report Status PENDING  Incomplete  Culture, blood (Routine X 2) w Reflex to ID Panel     Status: None (Preliminary result)   Collection Time: 08/29/19  4:00 PM   Specimen: BLOOD  Result Value Ref Range Status   Specimen Description BLOOD CENTRAL LINE  Final   Special Requests   Final    BOTTLES DRAWN AEROBIC AND ANAEROBIC Blood Culture adequate volume   Culture   Final    NO GROWTH 4 DAYS Performed at [facility], [place] [address] [place], [place] [address]    Report Status PENDING  Incomplete  Culture, respiratory (non-expectorated)     Status: None (Preliminary result)   Collection Time: 08/30/19  8:34 AM   Specimen: Tracheal Aspirate; Respiratory  Result Value Ref Range Status   Specimen Description TRACHEAL ASPIRATE  Final   Special Requests NONE  Final   Gram Stain   Final    RARE WBC PRESENT,BOTH PMN AND MONONUCLEAR NO  ORGANISMS SEEN Performed at [facility], [address], [place], [place] [address]    Culture RARE CANDIDA TROPICALIS  Final   Report Status PENDING  Incomplete  C Difficile Quick Screen (NO PCR Reflex)     Status: None   Collection Time: 09/01/19 12:26 AM   Specimen: STOOL  Result Value Ref Range Status   C Diff antigen NEGATIVE NEGATIVE Final   C Diff toxin NEGATIVE NEGATIVE Final   C Diff interpretation No C. difficile detected.  Final    Comment: Performed at [facility], [place] [address], [place], [place] [address]  MRSA PCR Screening     Status: None   Collection Time: 09/01/19  4:07 AM   Specimen: Nasal Mucosa; Nasopharyngeal  Result Value Ref Range Status   MRSA by PCR NEGATIVE NEGATIVE Final    Comment:        The GeneXpert MRSA Assay (FDA approved for NASAL specimens only), is one component of a comprehensive MRSA colonization surveillance program. It is not intended to diagnose MRSA infection nor to guide or monitor treatment for MRSA infections. Performed at [facility], [place] [address], [place],  [address]     Studies/Results: DG Chest Port 1 View  Result Date: 09/02/2019 CLINICAL DATA:  Chest tube placement EXAM: PORTABLE CHEST 1 VIEW COMPARISON:  08/31/2019 FINDINGS: Tracheostomy tube tip is above the carina. Left IJ catheter tip projects over the cavoatrial junction. Feeding tube is in place with tip below the field of view. Bilateral chest tubes in place without pneumothorax. Stable changes from median sternotomy and CABG procedure. Cardiac enlargement, unchanged. Small to moderate left pleural effusion is unchanged. Diffuse interstitial opacities are identified compatible with pulmonary edema. Similar to previous exam. IMPRESSION: 1. No change in aeration to the lungs compared with previous exam. 2.  Stable support apparatus. 3. No pneumothorax. Electronically Signed   By: Kerby Moors M.D.   On: 09/02/2019 07:11       Assessment/Plan:  INTERVAL HISTORY: remains afebrile  Principal Problem:   Candidemia ([place]) Active Problems:   CHF (congestive heart failure) (HCC)   Chronic pain syndrome   Elective surgery   Mixed hyperlipidemia   Moderate recurrent major depression (HCC)   Hypothyroidism   Acute systolic heart failure (HCC)   Type 2 diabetes mellitus with hyperglycemia (HCC)   AKI (acute kidney injury) ([place])   Coronary artery disease involving native coronary artery of native heart with unstable angina pectoris ([place])   Coronary artery disease   Malnutrition of moderate degree   Pressure injury of skin   Acute on chronic respiratory failure (HCC)   Acute respiratory failure (HCC)   S/P CABG (coronary artery bypass graft)   Cholecystitis   ARDS (adult respiratory distress syndrome) (HCC)   Cardiogenic shock (HCC)   FUO (fever of unknown origin)    Todd [name] is a 64 y.o. male with  complicated hospitalization following CABG that required [place] ECMO>>Impella support following several arrests and open chest procedures at the bedside. He has been on long term carbapenem up until recently when we switched him to zosyn and had gallbladder drain placed for rising bilirubinemia/cholangitis --> no obstructing stones on HIDA, ?Acalculous cholecystitis.   No growth from aspirated bile from perc-chole drain  He was found to have candida parapsilosis in blood  We had thought that he had defervesced on Zosyn but he had been on a cooling blanket.  When it was removed he started having fevers again they seem to have improved after initiation of vancomycin and meropenem.  I suspect it is the former antibiotic making a difference given how long he has been on broad-spectrum Carbapenem coverage   #1 candidemia:  Continue fluconazole  When able to discontinue central lines he should have a "line holiday with peripheral IV access and repeat blood cultures.  He should then have 2 weeks of antifungal  therapy (which is currently not possible)  I appreciate ophthalmology seeing the patient.  Unfortunately their exam was compromised the patient's mental status and inability coordinate with them  #2 possible acalculous cholecystitis I doubt he has any active infection in the biliary tree at this point in time  #3  Fevers: It turns out that his fevers defervesced seeing on Zosyn likely defervesced due to the presence of a cooling blanket.  He is having more secretions today which have been cultured but so far unrevealing  CT scan that showed bilateral effusions that may be coming loculated.  I wonder if the vancomycin is covering a pathogen in his effusion.  I am skeptical that the Carbapenem is making a difference given his protracted exposure to it  I would like to get rid of the Carbapenem sometime soon to reduce his exposure to such broad-spectrum antibiotics I worry about him developing multidrug-resistant gram-negative organisms. I will DC it tomorrow.    #4 diarrhea fortunately his C. difficile testing was negative     LOS: 38 days   Todd [name] 09/02/2019, 1:33 PM

## 2019-09-03 DIAGNOSIS — I5031 Acute diastolic (congestive) heart failure: Secondary | ICD-10-CM

## 2019-09-03 DIAGNOSIS — K819 Cholecystitis, unspecified: Secondary | ICD-10-CM | POA: Diagnosis not present

## 2019-09-03 DIAGNOSIS — J9622 Acute and chronic respiratory failure with hypercapnia: Secondary | ICD-10-CM | POA: Diagnosis not present

## 2019-09-03 DIAGNOSIS — J90 Pleural effusion, not elsewhere classified: Secondary | ICD-10-CM

## 2019-09-03 DIAGNOSIS — B377 Candidal sepsis: Secondary | ICD-10-CM | POA: Diagnosis not present

## 2019-09-03 LAB — CBC WITH DIFFERENTIAL/PLATELET
Abs Immature Granulocytes: 0.16 10*3/uL — ABNORMAL HIGH (ref 0.00–0.07)
Basophils Absolute: 0.1 10*3/uL (ref 0.0–0.1)
Basophils Relative: 0 %
Eosinophils Absolute: 0.4 10*3/uL (ref 0.0–0.5)
Eosinophils Relative: 2 %
HCT: 28.9 % — ABNORMAL LOW (ref 39.0–52.0)
Hemoglobin: 8.3 g/dL — ABNORMAL LOW (ref 13.0–17.0)
Immature Granulocytes: 1 %
Lymphocytes Relative: 7 %
Lymphs Abs: 1.1 10*3/uL (ref 0.7–4.0)
MCH: 31 pg (ref 26.0–34.0)
MCHC: 28.7 g/dL — ABNORMAL LOW (ref 30.0–36.0)
MCV: 107.8 fL — ABNORMAL HIGH (ref 80.0–100.0)
Monocytes Absolute: 0.8 10*3/uL (ref 0.1–1.0)
Monocytes Relative: 5 %
Neutro Abs: 14.3 10*3/uL — ABNORMAL HIGH (ref 1.7–7.7)
Neutrophils Relative %: 85 %
Platelets: 335 10*3/uL (ref 150–400)
RBC: 2.68 MIL/uL — ABNORMAL LOW (ref 4.22–5.81)
RDW: 23.9 % — ABNORMAL HIGH (ref 11.5–15.5)
WBC: 17.2 10*3/uL — ABNORMAL HIGH (ref 4.0–10.5)
nRBC: 0.2 % (ref 0.0–0.2)

## 2019-09-03 LAB — GLUCOSE, CAPILLARY
Glucose-Capillary: 103 mg/dL — ABNORMAL HIGH (ref 70–99)
Glucose-Capillary: 124 mg/dL — ABNORMAL HIGH (ref 70–99)
Glucose-Capillary: 125 mg/dL — ABNORMAL HIGH (ref 70–99)
Glucose-Capillary: 161 mg/dL — ABNORMAL HIGH (ref 70–99)
Glucose-Capillary: 180 mg/dL — ABNORMAL HIGH (ref 70–99)
Glucose-Capillary: 199 mg/dL — ABNORMAL HIGH (ref 70–99)
Glucose-Capillary: 49 mg/dL — ABNORMAL LOW (ref 70–99)
Glucose-Capillary: 58 mg/dL — ABNORMAL LOW (ref 70–99)
Glucose-Capillary: 98 mg/dL (ref 70–99)

## 2019-09-03 LAB — COMPREHENSIVE METABOLIC PANEL
ALT: 137 U/L — ABNORMAL HIGH (ref 0–44)
AST: 142 U/L — ABNORMAL HIGH (ref 15–41)
Albumin: 1.7 g/dL — ABNORMAL LOW (ref 3.5–5.0)
Alkaline Phosphatase: 409 U/L — ABNORMAL HIGH (ref 38–126)
Anion gap: 9 (ref 5–15)
BUN: 79 mg/dL — ABNORMAL HIGH (ref 8–23)
CO2: 25 mmol/L (ref 22–32)
Calcium: 8.2 mg/dL — ABNORMAL LOW (ref 8.9–10.3)
Chloride: 114 mmol/L — ABNORMAL HIGH (ref 98–111)
Creatinine, Ser: 1.29 mg/dL — ABNORMAL HIGH (ref 0.61–1.24)
GFR calc Af Amer: >60 mL/min (ref >60)
GFR calc non Af Amer: 58 mL/min — ABNORMAL LOW (ref >60)
Glucose, Bld: 78 mg/dL (ref 70–99)
Potassium: 3.3 mmol/L — ABNORMAL LOW (ref 3.5–5.1)
Sodium: 148 mmol/L — ABNORMAL HIGH (ref 135–145)
Total Bilirubin: 8.1 mg/dL — ABNORMAL HIGH (ref 0.3–1.2)
Total Protein: 5.9 g/dL — ABNORMAL LOW (ref 6.5–8.1)

## 2019-09-03 LAB — BASIC METABOLIC PANEL
Anion gap: 8 (ref 5–15)
BUN: 73 mg/dL — ABNORMAL HIGH (ref 8–23)
CO2: 26 mmol/L (ref 22–32)
Calcium: 8 mg/dL — ABNORMAL LOW (ref 8.9–10.3)
Chloride: 111 mmol/L (ref 98–111)
Creatinine, Ser: 1.3 mg/dL — ABNORMAL HIGH (ref 0.61–1.24)
GFR calc Af Amer: >60 mL/min (ref >60)
GFR calc non Af Amer: 57 mL/min — ABNORMAL LOW (ref >60)
Glucose, Bld: 180 mg/dL — ABNORMAL HIGH (ref 70–99)
Potassium: 4.7 mmol/L (ref 3.5–5.1)
Sodium: 145 mmol/L (ref 135–145)

## 2019-09-03 LAB — CULTURE, BLOOD (ROUTINE X 2)
Culture: NO GROWTH
Culture: NO GROWTH
Special Requests: ADEQUATE
Special Requests: ADEQUATE

## 2019-09-03 LAB — APTT: aPTT: 56 seconds — ABNORMAL HIGH (ref 24–36)

## 2019-09-03 LAB — VANCOMYCIN, TROUGH: Vancomycin Tr: 22 ug/mL (ref 15–20)

## 2019-09-03 MED ORDER — POTASSIUM CHLORIDE 10 MEQ/100ML IV SOLN
10.0000 meq | INTRAVENOUS | Status: AC
  Administered 2019-09-03 (×4): 10 meq via INTRAVENOUS
  Filled 2019-09-03 (×4): qty 100

## 2019-09-03 MED ORDER — INSULIN DETEMIR 100 UNIT/ML ~~LOC~~ SOLN
10.0000 [IU] | Freq: Two times a day (BID) | SUBCUTANEOUS | Status: DC
  Administered 2019-09-03 (×2): 10 [IU] via SUBCUTANEOUS
  Filled 2019-09-03 (×4): qty 0.1

## 2019-09-03 MED ORDER — DEXTROSE 10 % IV SOLN: INTRAVENOUS | Status: DC

## 2019-09-03 MED ORDER — FREE WATER
300.0000 mL | Status: DC
  Administered 2019-09-03 – 2019-09-07 (×19): 300 mL

## 2019-09-03 MED ORDER — DEXTROSE 50 % IV SOLN
12.5000 g | Freq: Once | INTRAVENOUS | Status: AC
  Administered 2019-09-03: 12.5 g via INTRAVENOUS

## 2019-09-03 MED ORDER — DEXTROSE 50 % IV SOLN
1.0000 | Freq: Once | INTRAVENOUS | Status: AC
  Administered 2019-09-03: 50 mL via INTRAVENOUS

## 2019-09-03 MED ORDER — POTASSIUM CHLORIDE 20 MEQ/15ML (10%) PO SOLN
60.0000 meq | Freq: Once | ORAL | Status: AC
  Administered 2019-09-03: 60 meq
  Filled 2019-09-03: qty 45

## 2019-09-03 MED ORDER — VANCOMYCIN HCL 750 MG/150ML IV SOLN
750.0000 mg | INTRAVENOUS | Status: AC
  Administered 2019-09-03 – 2019-09-13 (×11): 750 mg via INTRAVENOUS
  Filled 2019-09-03 (×11): qty 150

## 2019-09-03 NOTE — Progress Notes (Signed)
Hypoglycemic event  Initial CBG: 58 CBG 15 min after D50 12.5g given: 98  E-link notified, D10 IV infusion increased  Will continue to monitor closely

## 2019-09-03 NOTE — Progress Notes (Addendum)
Hypoglycemic event  Initial CBG: 69 CBG 15 min after D50 12.5g given: 103 (d/t tube feeds being held)  E-link notified, D10 IV infusion ordered  Will continue to monitor closely

## 2019-09-03 NOTE — Consult Note (Signed)
WOC Nurse wound follow up Patient receiving care in [facility] 2H2.  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 Helmut Muster, RN, MSN, CWOCN, CNS-BC, pager [phone]

## 2019-09-03 NOTE — Progress Notes (Signed)
eLink Physician-Brief Progress Note Patient Name: Todd [name] DOB: Jun 21, 1955 MRN: [id number]   Date of Service  09/03/2019  HPI/Events of Note  Hypoglycemia - Blood glucose = 69. Tube feeds being held.   eICU Interventions  Plan: 1. D10W IV infusion at 30 mL/hour.     Intervention Category Major Interventions: Other:  [name] 09/03/2019, 12:29 AM

## 2019-09-03 NOTE — Progress Notes (Signed)
   Risks and benefits image guided gastrostomy tube placement was discussed with the patient's wife Synetta Fail via phone including, but not limited to the need for a barium enema during the procedure, bleeding, infection, peritonitis and/or damage to adjacent structures.  All questions were answered, she is agreeable to proceed.  Consent signed and in IR.

## 2019-09-03 NOTE — Progress Notes (Signed)
Subjective:  Pt being cleaned  Antibiotics:  Anti-infectives (From admission, onward)   Start     Dose/Rate Route Frequency Ordered Stop   08/31/19 1000  vancomycin (VANCOREADY) IVPB 1250 mg/250 mL     Discontinue     1,250 mg 166.7 mL/hr over 90 Minutes Intravenous Every 24 hours 08/30/19 1323     08/30/19 0930  meropenem (MERREM) 1 g in sodium chloride 0.9 % 100 mL IVPB  Status:  Discontinued        1 g 200 mL/hr over 30 Minutes Intravenous Every 8 hours 08/30/19 0917 09/03/19 0927   08/30/19 0930  vancomycin (VANCOREADY) IVPB 1500 mg/300 mL        1,500 mg 150 mL/hr over 120 Minutes Intravenous  Once 08/30/19 0918 08/30/19 1152   08/25/19 0200  fluconazole (DIFLUCAN) IVPB 400 mg     Discontinue     400 mg 100 mL/hr over 120 Minutes Intravenous Every 24 hours 08/25/19 0146     08/22/19 1400  piperacillin-tazobactam (ZOSYN) IVPB 3.375 g  Status:  Discontinued        3.375 g 12.5 mL/hr over 240 Minutes Intravenous Every 8 hours 08/22/19 1032 08/29/19 1021   08/20/19 2200  vancomycin (VANCOREADY) IVPB 750 mg/150 mL  Status:  Discontinued        750 mg 150 mL/hr over 60 Minutes Intravenous Every 24 hours 08/19/19 1623 08/20/19 0758   08/20/19 0900  vancomycin (VANCOREADY) IVPB 750 mg/150 mL  Status:  Discontinued        750 mg 150 mL/hr over 60 Minutes Intravenous Every 24 hours 08/20/19 0758 08/21/19 0852   08/16/19 1400  vancomycin (VANCOCIN) IVPB 1000 mg/200 mL premix  Status:  Discontinued        1,000 mg 200 mL/hr over 60 Minutes Intravenous Every 24 hours 08/16/19 1108 08/19/19 1623   08/16/19 0745  anidulafungin ([name]) 100 mg in sodium chloride 0.9 % 100 mL IVPB  Status:  Discontinued       "Followed by" Linked Group Details   100 mg 78 mL/hr over 100 Minutes Intravenous Every 24 hours 08/15/19 0741 08/22/19 1032   08/15/19 0730  anidulafungin ([name]) 200 mg in sodium chloride 0.9 % 200 mL IVPB       "Followed by" Linked Group Details   200 mg 78 mL/hr over  200 Minutes Intravenous  Once 08/15/19 0741 08/15/19 1346   08/13/19 1400  meropenem (MERREM) 1 g in sodium chloride 0.9 % 100 mL IVPB  Status:  Discontinued        1 g 200 mL/hr over 30 Minutes Intravenous Every 8 hours 08/13/19 1236 08/22/19 1032   08/13/19 1400  vancomycin (VANCOREADY) IVPB 1500 mg/300 mL  Status:  Discontinued        1,500 mg 150 mL/hr over 120 Minutes Intravenous Every 24 hours 08/13/19 1244 08/16/19 1108   08/10/19 1504  vancomycin (VANCOCIN) 1,000 mg in sodium chloride 0.9 % 1,000 mL irrigation  Status:  Discontinued          As needed 08/10/19 1505 08/10/19 1840   08/10/19 1500  vancomycin (VANCOCIN) 1,000 mg in sodium chloride 0.9 % 1,000 mL irrigation  Status:  Discontinued         Irrigation To Surgery 08/10/19 1448 08/10/19 1453   08/10/19 1500  vancomycin (VANCOCIN) 1,000 mg in sodium chloride 0.9 % 1,000 mL irrigation  Status:  Discontinued         Irrigation To Surgery  08/10/19 1454 08/10/19 1855   08/10/19 0800  vancomycin (VANCOREADY) IVPB 1750 mg/350 mL        1,750 mg 175 mL/hr over 120 Minutes Intravenous Every 36 hours 08/09/19 1426 08/11/19 2356   08/08/19 2232  vancomycin variable dose per unstable renal function (pharmacist dosing)  Status:  Discontinued         Does not apply See admin instructions 08/08/19 2232 08/09/19 1426   08/06/19 1827  vancomycin (VANCOCIN) powder  Status:  Discontinued          As needed 08/06/19 1828 08/06/19 1936   08/06/19 1503  vancomycin (VANCOCIN) 1,000 mg in sodium chloride 0.9 % 1,000 mL irrigation  Status:  Discontinued          As needed 08/06/19 1504 08/06/19 1936   08/04/19 2100  vancomycin (VANCOCIN) IVPB 1000 mg/200 mL premix  Status:  Discontinued        1,000 mg 200 mL/hr over 60 Minutes Intravenous Every 12 hours 08/04/19 0721 08/08/19 2225   08/04/19 0730  vancomycin (VANCOREADY) IVPB 2000 mg/400 mL        2,000 mg 200 mL/hr over 120 Minutes Intravenous  Once 08/04/19 0721 08/04/19 1003   08/04/19 0730   meropenem (MERREM) 1 g in sodium chloride 0.9 % 100 mL IVPB        1 g 200 mL/hr over 30 Minutes Intravenous Every 8 hours 08/04/19 0721 08/12/19 2220   08/02/19 2030  vancomycin (VANCOCIN) IVPB 1000 mg/200 mL premix        1,000 mg 200 mL/hr over 60 Minutes Intravenous  Once 08/02/19 1516 08/02/19 2151   08/02/19 1530  cefUROXime (ZINACEF) 1.5 g in sodium chloride 0.9 % 100 mL IVPB        1.5 g 200 mL/hr over 30 Minutes Intravenous Every 12 hours 08/02/19 1516 08/04/19 0430   08/02/19 0400  vancomycin (VANCOREADY) IVPB 1250 mg/250 mL        1,250 mg 166.7 mL/hr over 90 Minutes Intravenous To Surgery 08/01/19 1422 08/02/19 0923   08/02/19 0400  cefUROXime (ZINACEF) 1.5 g in sodium chloride 0.9 % 100 mL IVPB        1.5 g 200 mL/hr over 30 Minutes Intravenous To Surgery 08/01/19 1422 08/02/19 0853   08/02/19 0400  cefUROXime (ZINACEF) 750 mg in sodium chloride 0.9 % 100 mL IVPB        750 mg 200 mL/hr over 30 Minutes Intravenous To Surgery 08/01/19 1422 08/02/19 1430   08/01/19 0400  vancomycin (VANCOREADY) IVPB 1250 mg/250 mL  Status:  Discontinued        1,250 mg 166.7 mL/hr over 90 Minutes Intravenous To Surgery 07/31/19 1034 08/01/19 1421   08/01/19 0400  cefUROXime (ZINACEF) 1.5 g in sodium chloride 0.9 % 100 mL IVPB  Status:  Discontinued        1.5 g 200 mL/hr over 30 Minutes Intravenous To Surgery 07/31/19 1034 08/01/19 1421   08/01/19 0400  cefUROXime (ZINACEF) 750 mg in sodium chloride 0.9 % 100 mL IVPB  Status:  Discontinued        750 mg 200 mL/hr over 30 Minutes Intravenous To Surgery 07/31/19 1034 08/01/19 1421      Medications: Scheduled Meds: . amiodarone  200 mg Per Tube BID  . aspirin  81 mg Per Tube Daily  . chlorhexidine gluconate (MEDLINE KIT)  15 mL Mouth Rinse BID  . Chlorhexidine Gluconate Cloth  6 each Topical Daily  . cholestyramine  4 g Oral  BID  . collagenase   Topical Daily  . feeding supplement (PROSource TF)  45 mL Per Tube Daily  . free water  100  mL Per Tube Q1H  . insulin aspart  0-24 Units Subcutaneous Q4H  . insulin detemir  10 Units Subcutaneous BID  . ipratropium  0.5 mg Nebulization TID BM  . levalbuterol  1.25 mg Nebulization TID BM  . mouth rinse  15 mL Mouth Rinse 10 times per day  . midodrine  15 mg Per Tube TID WC  . nutrition supplement (JUVEN)  1 packet Per Tube BID BM  . sodium chloride flush  3 mL Intravenous Q12H  . sodium chloride flush  5 mL Intracatheter Q8H  . thiamine injection  100 mg Intravenous Daily   Continuous Infusions: . sodium chloride Stopped (08/13/19 1631)  . bivalirudin (ANGIOMAX) infusion 0.5 mg/mL (Non-ACS indications) 0.055 mg/kg/hr (09/03/19 1000)  . dextrose 60 mL/hr at 09/03/19 1000  . feeding supplement (PIVOT 1.5 CAL) 1,000 mL (09/03/19 0830)  . fluconazole (DIFLUCAN) IV Stopped (09/03/19 0350)  . vancomycin Stopped (09/02/19 1121)   PRN Meds:.sodium chloride, acetaminophen (TYLENOL) oral liquid 160 mg/5 mL, dextrose, fentaNYL (SUBLIMAZE) injection, Gerhardt's butt cream, lip balm, metoprolol tartrate, ondansetron (ZOFRAN) IV, sodium chloride flush    Objective: Weight change: -3.8 kg  Intake/Output Summary (Last 24 hours) at 09/03/2019 1102 Last data filed at 09/03/2019 1000 Gross per 24 hour  Intake 2668.86 ml  Output 4995 ml  Net -2326.14 ml   Blood pressure (!) 116/49, pulse 78, temperature 98.1 F (36.7 C), resp. rate 22, height 5' 9" (1.753 m), weight 68.2 kg, SpO2 100 %. Temp:  [98.1 F (36.7 C)-99.5 F (37.5 C)] 98.1 F (36.7 C) (07/26 0728) Pulse Rate:  [66-152] 78 (07/26 1000) Resp:  [13-28] 22 (07/26 1000) BP: (95-125)/(38-96) 116/49 (07/26 1000) SpO2:  [96 %-100 %] 100 % (07/26 1042) FiO2 (%):  [40 %] 40 % (07/26 1042) Weight:  [68.2 kg] 68.2 kg (07/26 0500)  Physical Exam: General:confused jaundiced Biliary drain in place central lines in place Lower back decubitus ulcer 09/03/2019:     CBC:    BMET Recent Labs    09/02/19 1825 09/03/19 0245    NA 146* 148*  K 4.0 3.3*  CL 115* 114*  CO2 23 25  GLUCOSE 104* 78  BUN 84* 79*  CREATININE 1.21 1.29*  CALCIUM 7.9* 8.2*     Liver Panel  Recent Labs    09/02/19 0249 09/02/19 0249 09/02/19 1825 09/03/19 0245  PROT 5.4*  --   --  5.9*  ALBUMIN 1.6*   < > 1.6* 1.7*  AST 125*  --   --  142*  ALT 125*  --   --  137*  ALKPHOS 393*  --   --  409*  BILITOT 7.5*  --   --  8.1*   < > = values in this interval not displayed.       Sedimentation Rate No results for input(s): ESRSEDRATE in the last 72 hours. C-Reactive Protein No results for input(s): CRP in the last 72 hours.  Micro Results: Recent Results (from the past 720 hour(s))  Culture, respiratory (non-expectorated)     Status: None   Collection Time: 08/11/19  3:32 PM   Specimen: Tracheal Aspirate; Respiratory  Result Value Ref Range Status   Specimen Description TRACHEAL ASPIRATE  Final   Special Requests Normal  Final   Gram Stain   Final    NO WBC SEEN NO  SQUAMOUS EPITHELIAL CELLS SEEN RARE BUDDING YEAST SEEN Performed at [facility], [place] [address], [place], [place] [address]    Culture FEW CANDIDA TROPICALIS  Final   Report Status 08/13/2019 FINAL  Final  Culture, blood (Routine X 2) w Reflex to ID Panel     Status: None   Collection Time: 08/13/19  8:27 AM   Specimen: BLOOD  Result Value Ref Range Status   Specimen Description BLOOD BLOOD LEFT ARM  Final   Special Requests   Final    BOTTLES DRAWN AEROBIC AND ANAEROBIC Blood Culture adequate volume   Culture   Final    NO GROWTH 5 DAYS Performed at [facility], [place] [address], [place], [place] [address]    Report Status 08/18/2019 FINAL  Final  Culture, blood (Routine X 2) w Reflex to ID Panel     Status: None   Collection Time: 08/13/19  8:31 AM   Specimen: BLOOD  Result Value Ref Range Status   Specimen Description BLOOD BLOOD LEFT HAND  Final   Special Requests   Final    BOTTLES DRAWN AEROBIC AND ANAEROBIC Blood Culture  adequate volume   Culture   Final    NO GROWTH 5 DAYS Performed at [facility], [place] [address], [place], [place] [address]    Report Status 08/18/2019 FINAL  Final  Culture, respiratory (non-expectorated)     Status: None   Collection Time: 08/13/19  4:12 PM   Specimen: Tracheal Aspirate; Respiratory  Result Value Ref Range Status   Specimen Description TRACHEAL ASPIRATE  Final   Special Requests NONE  Final   Gram Stain   Final    NO WBC SEEN FEW YEAST Performed at [facility], [place] [address] [place], [place] [address]    Culture FEW CANDIDA TROPICALIS  Final   Report Status 08/16/2019 FINAL  Final  Culture, Urine     Status: None   Collection Time: 08/15/19  6:13 PM   Specimen: Urine, Catheterized  Result Value Ref Range Status   Specimen Description URINE, CATHETERIZED  Final   Special Requests NONE  Final   Culture   Final    NO GROWTH Performed at [facility], [address] [place], [place] [address]    Report Status 08/17/2019 FINAL  Final  Culture, blood (routine x 2)     Status: None   Collection Time: 08/15/19  7:53 PM   Specimen: BLOOD RIGHT HAND  Result Value Ref Range Status   Specimen Description BLOOD RIGHT HAND  Final   Special Requests   Final    BOTTLES DRAWN AEROBIC AND ANAEROBIC Blood Culture adequate volume   Culture   Final    NO GROWTH 5 DAYS Performed at [facility], [place] [address] [place], [place] [address]    Report Status 08/20/2019 FINAL  Final  Culture, blood (routine x 2)     Status: None   Collection Time: 08/15/19  7:53 PM   Specimen: BLOOD RIGHT HAND  Result Value Ref Range Status   Specimen Description BLOOD RIGHT HAND  Final   Special Requests   Final    BOTTLES DRAWN AEROBIC AND ANAEROBIC Blood Culture adequate volume   Culture   Final    NO GROWTH 5 DAYS Performed at [facility], [place] [address], [place], [place] [address]    Report Status 08/20/2019 FINAL  Final  Aerobic/Anaerobic Culture  (surgical/deep wound)  Status: None   Collection Time: 08/17/19  2:32 PM   Specimen: Wound  Result Value Ref Range Status   Specimen Description WOUND  Final   Special Requests NONE  Final   Gram Stain NO WBC SEEN NO ORGANISMS SEEN   Final   Culture   Final    No growth aerobically or anaerobically. Performed at [facility], [place] [address] [place], [place] [address]    Report Status 08/22/2019 FINAL  Final  Culture, fungus without smear     Status: Abnormal (Preliminary result)   Collection Time: 08/20/19  2:00 PM   Specimen: Bronchoalveolar Lavage; Other  Result Value Ref Range Status   Specimen Description BRONCHIAL ALVEOLAR LAVAGE  Final   Special Requests   Final    Normal Performed at [facility], [place] [address], [place], [place] [address]    Culture CANDIDA ALBICANS (A)  Final   Report Status PENDING  Incomplete  Culture, bal-quantitative     Status: Abnormal   Collection Time: 08/20/19  2:00 PM   Specimen: Bronchoalveolar Lavage; Respiratory  Result Value Ref Range Status   Specimen Description BRONCHIAL ALVEOLAR LAVAGE  Final   Special Requests NONE  Final   Gram Stain   Final    FEW WBC PRESENT, PREDOMINANTLY PMN NO ORGANISMS SEEN Performed at [facility], [address], [place], [place] [address]    Culture (A)  Final    4,000 COLONIES/mL CANDIDA TROPICALIS 3,000 COLONIES/mL CANDIDA ALBICANS    Report Status 08/25/2019 FINAL  Final  Body fluid culture     Status: None   Collection Time: 08/22/19  9:14 AM   Specimen: Pleura; Body Fluid  Result Value Ref Range Status   Specimen Description PLEURAL  Final   Special Requests NONE  Final   Gram Stain   Final    NO WBC SEEN NO ORGANISMS SEEN Performed at [facility], [address] [place], [place] [address]    Culture RARE CANDIDA PARAPSILOSIS  Final   Report Status 08/25/2019 FINAL  Final  Culture, blood (routine x 2)     Status: Abnormal   Collection Time: 08/22/19 12:58  PM   Specimen: BLOOD RIGHT HAND  Result Value Ref Range Status   Specimen Description BLOOD RIGHT HAND  Final   Special Requests   Final    BOTTLES DRAWN AEROBIC ONLY Blood Culture adequate volume   Culture  Setup Time   Final    AEROBIC BOTTLE ONLY YEAST CRITICAL RESULT CALLED TO, READ BACK BY AND VERIFIED WITH: Karsten Ro [facility] 08/25/19 0123 JDW Performed at [facility], [place] [address], [place], [place] [address]    Culture CANDIDA PARAPSILOSIS (A)  Final   Report Status 08/26/2019 FINAL  Final  Culture, blood (routine x 2)     Status: None   Collection Time: 08/22/19 12:58 PM   Specimen: BLOOD RIGHT HAND  Result Value Ref Range Status   Specimen Description BLOOD RIGHT HAND  Final   Special Requests   Final    BOTTLES DRAWN AEROBIC ONLY Blood Culture adequate volume   Culture   Final    NO GROWTH 5 DAYS Performed at [facility], [place] [address] [place], [place] [address]    Report Status 08/27/2019 FINAL  Final  Blood Culture ID Panel (Reflexed)     Status: Abnormal   Collection Time: 08/22/19 12:58 PM  Result Value Ref Range Status   Enterococcus  species NOT DETECTED NOT DETECTED Final   Listeria monocytogenes NOT DETECTED NOT DETECTED Final   Staphylococcus species NOT DETECTED NOT DETECTED Final   Staphylococcus aureus (BCID) NOT DETECTED NOT DETECTED Final   Streptococcus species NOT DETECTED NOT DETECTED Final   Streptococcus agalactiae NOT DETECTED NOT DETECTED Final   Streptococcus pneumoniae NOT DETECTED NOT DETECTED Final   Streptococcus pyogenes NOT DETECTED NOT DETECTED Final   Acinetobacter baumannii NOT DETECTED NOT DETECTED Final   Enterobacteriaceae species NOT DETECTED NOT DETECTED Final   Enterobacter cloacae complex NOT DETECTED NOT DETECTED Final   Escherichia coli NOT DETECTED NOT DETECTED Final   Klebsiella oxytoca NOT DETECTED NOT DETECTED Final   Klebsiella pneumoniae NOT DETECTED NOT DETECTED Final   Proteus species NOT DETECTED NOT  DETECTED Final   Serratia marcescens NOT DETECTED NOT DETECTED Final   Haemophilus influenzae NOT DETECTED NOT DETECTED Final   Neisseria meningitidis NOT DETECTED NOT DETECTED Final   Pseudomonas aeruginosa NOT DETECTED NOT DETECTED Final   Candida albicans NOT DETECTED NOT DETECTED Final   Candida glabrata NOT DETECTED NOT DETECTED Final   Candida krusei NOT DETECTED NOT DETECTED Final   Candida parapsilosis DETECTED (A) NOT DETECTED Final    Comment: CRITICAL RESULT CALLED TO, READ BACK BY AND VERIFIED WITH: J LEDFORD PHARMD 08/25/19 0123 JDW    Candida tropicalis NOT DETECTED NOT DETECTED Final    Comment: Performed at [facility], [address], [place], [place] [address]  Aerobic/Anaerobic Culture (surgical/deep wound)     Status: None   Collection Time: 08/24/19  1:35 PM   Specimen: Abscess  Result Value Ref Range Status   Specimen Description ABSCESS  Final   Special Requests DRAIN  Final   Gram Stain   Final    RARE WBC PRESENT, PREDOMINANTLY PMN NO ORGANISMS SEEN    Culture   Final    No growth aerobically or anaerobically. Performed at [facility], [place] [address], [place], [place] [address]    Report Status 08/29/2019 FINAL  Final  Culture, blood (routine x 2)     Status: None   Collection Time: 08/25/19  3:53 PM   Specimen: BLOOD RIGHT HAND  Result Value Ref Range Status   Specimen Description BLOOD RIGHT HAND  Final   Special Requests   Final    BOTTLES DRAWN AEROBIC AND ANAEROBIC Blood Culture adequate volume   Culture   Final    NO GROWTH 5 DAYS Performed at [facility], [place] [address], [place], [place] [address]    Report Status 08/30/2019 FINAL  Final  Culture, blood (routine x 2)     Status: None   Collection Time: 08/25/19  3:53 PM   Specimen: BLOOD RIGHT ARM  Result Value Ref Range Status   Specimen Description BLOOD RIGHT ARM  Final   Special Requests   Final    BOTTLES DRAWN AEROBIC ONLY Blood Culture adequate volume   Culture    Final    NO GROWTH 5 DAYS Performed at [facility], [place] [address] [place], [place] [address]    Report Status 08/30/2019 FINAL  Final  Culture, blood (routine x 2)     Status: None   Collection Time: 08/29/19  9:28 AM   Specimen: BLOOD RIGHT HAND  Result Value Ref Range Status   Specimen Description BLOOD RIGHT HAND  Final   Special Requests   Final    BOTTLES DRAWN AEROBIC ONLY Blood Culture adequate  volume   Culture   Final    NO GROWTH 5 DAYS Performed at [facility], [place] [address], [place], [place] [address]    Report Status 09/03/2019 FINAL  Final  Culture, blood (Routine X 2) w Reflex to ID Panel     Status: None   Collection Time: 08/29/19  4:00 PM   Specimen: BLOOD  Result Value Ref Range Status   Specimen Description BLOOD CENTRAL LINE  Final   Special Requests   Final    BOTTLES DRAWN AEROBIC AND ANAEROBIC Blood Culture adequate volume   Culture   Final    NO GROWTH 5 DAYS Performed at [facility], [place] [address] [place], [place] [address]    Report Status 09/03/2019 FINAL  Final  Culture, respiratory (non-expectorated)     Status: None   Collection Time: 08/30/19  8:34 AM   Specimen: Tracheal Aspirate; Respiratory  Result Value Ref Range Status   Specimen Description TRACHEAL ASPIRATE  Final   Special Requests NONE  Final   Gram Stain   Final    RARE WBC PRESENT,BOTH PMN AND MONONUCLEAR NO ORGANISMS SEEN Performed at [facility], [address] [place], [place] [address]    Culture RARE CANDIDA TROPICALIS RARE CANDIDA ALBICANS   Final   Report Status 09/02/2019 FINAL  Final  C Difficile Quick Screen (NO PCR Reflex)     Status: None   Collection Time: 09/01/19 12:26 AM   Specimen: STOOL  Result Value Ref Range Status   C Diff antigen NEGATIVE NEGATIVE Final   C Diff toxin NEGATIVE NEGATIVE Final   C Diff interpretation No C. difficile detected.  Final    Comment: Performed at [facility], [place] [address],  [place], [place] [address]  MRSA PCR Screening     Status: None   Collection Time: 09/01/19  4:07 AM   Specimen: Nasal Mucosa; Nasopharyngeal  Result Value Ref Range Status   MRSA by PCR NEGATIVE NEGATIVE Final    Comment:        The GeneXpert MRSA Assay (FDA approved for NASAL specimens only), is one component of a comprehensive MRSA colonization surveillance program. It is not intended to diagnose MRSA infection nor to guide or monitor treatment for MRSA infections. Performed at [facility], [place] [address] [place], [place] [address]     Studies/Results: DG Chest Port 1 View  Result Date: 09/02/2019 CLINICAL DATA:  Chest tube placement EXAM: PORTABLE CHEST 1 VIEW COMPARISON:  08/31/2019 FINDINGS: Tracheostomy tube tip is above the carina. Left IJ catheter tip projects over the cavoatrial junction. Feeding tube is in place with tip below the field of view. Bilateral chest tubes in place without pneumothorax. Stable changes from median sternotomy and CABG procedure. Cardiac enlargement, unchanged. Small to moderate left pleural effusion is unchanged. Diffuse interstitial opacities are identified compatible with pulmonary edema. Similar to previous exam. IMPRESSION: 1. No change in aeration to the lungs compared with previous exam. 2. Stable support apparatus. 3. No pneumothorax. Electronically Signed   By: Kerby Moors M.D.   On: 09/02/2019 07:11      Assessment/Plan:  INTERVAL HISTORY: remains afebrile  Principal Problem:   Candidemia ([place]) Active Problems:   CHF (congestive heart failure) (HCC)   Chronic pain syndrome   Elective surgery   Mixed hyperlipidemia   Moderate recurrent major depression (HCC)   Hypothyroidism   Acute systolic heart failure (HCC)   Type 2 diabetes mellitus  with hyperglycemia ([place])   AKI (acute kidney injury) ([place])   Coronary artery disease involving native coronary artery of native heart with unstable angina pectoris ([place])   Coronary artery  disease   Malnutrition of moderate degree   Pressure injury of skin   Acute on chronic respiratory failure (HCC)   Acute respiratory failure (HCC)   S/P CABG (coronary artery bypass graft)   Cholecystitis   ARDS (adult respiratory distress syndrome) (HCC)   Cardiogenic shock (HCC)   FUO (fever of unknown origin)    Todd [name] is a 64 y.o. male with  complicated hospitalization following CABG that required [place] ECMO>>Impella support following several arrests and open chest procedures at the bedside. He has been on long term carbapenem up until recently when we switched him to zosyn and had gallbladder drain placed for rising bilirubinemia/cholangitis --> no obstructing stones on HIDA, ?Acalculous cholecystitis.   No growth from aspirated bile from perc-chole drain  He was found to have candida parapsilosis in blood  We had thought that he had defervesced on Zosyn but he had been on a cooling blanket.  When it was removed he started having fevers again they seem to have improved after initiation of vancomycin and meropenem.  I suspect it is the former antibiotic making a difference given how long he has been on broad-spectrum Carbapenem coverage   #1 candidemia:  Continue fluconazole  When able to discontinue central lines he should have a "line holiday with peripheral IV access and repeat blood cultures.  He should then have 2 weeks of antifungal therapy (which is currently not possible)  I appreciate ophthalmology seeing the patient.  Unfortunately their exam was compromised the patient's mental status and inability coordinate with them  #2 possible acalculous cholecystitis I doubt he has any active infection in the biliary tree at this point in time  #3  Fevers: It turns out that his fevers defervesced seeing on Zosyn likely defervesced due to the presence of a cooling blanket.  He is having more secretions today which have been cultured but so far unrevealing  CT scan that showed  bilateral effusions that may be coming loculated.  I wonder if the vancomycin is covering a pathogen in his effusion.  I am skeptical that the Carbapenem is making a difference given his protracted exposure to it  DC carbapenem today   #4 diarrhea fortunately his C. difficile testing was negative  #5  Loculated effusions: would drain this or at least sample, certainly if fevers recur  #6 Decubitus ulcer: examined and WOC following    LOS: 39 days   Alcide Evener 09/03/2019, 11:02 AM

## 2019-09-03 NOTE — Progress Notes (Signed)
Patient ID: Todd [name], male   DOB: 09-25-1955, 64 y.o.   MRN: [id number] TCTS DAILY ICU PROGRESS NOTE                   [address]            [place] [address]          [phone]   17 Days Post-Op Procedure(s) (LRB): REMOVAL OF IMPELLA LEFT VENTRICULAR ASSIST DEVICE (N/A) TRANSESOPHAGEAL ECHOCARDIOGRAM (TEE) (N/A)  DATE OF PROCEDURE: 08/02/2019 PREOPERATIVE DIAGNOSIS: Severe 3-vessel coronary disease with ischemic cardiomyopathy. POSTOPERATIVE DIAGNOSIS: Severe 3-vessel coronary disease with ischemic cardiomyopathy. PROCEDURE:  Median sternotomy, extracorporeal circulation, Coronary artery bypass grafting x 4 Left internal mammary artery to left anterior descending, Saphenous vein graft to 1st diagonal, Saphenous vein graft to posterior descending, Left radial to obtuse marginal 1 Endoscopic vein harvestright thigh   Total Length of Stay:  LOS: 39 days   Subjective: Patient awake, opens eyes, no purposeful movement this morning not consistently following commands but overall mental status has improved over the past 2 days  Objective: Vital signs in last 24 hours: Temp:  [98.1 F (36.7 C)-99.5 F (37.5 C)] 98.1 F (36.7 C) (07/26 0728) Pulse Rate:  [66-152] 74 (07/26 0716) Cardiac Rhythm: Normal sinus rhythm (07/26 0400) Resp:  [13-28] 27 (07/26 0716) BP: (95-125)/(38-96) 115/50 (07/26 0700) SpO2:  [96 %-100 %] 100 % (07/26 0716) Arterial Line BP: (88-102)/(38-43) 101/43 (07/25 1100) FiO2 (%):  [40 %] 40 % (07/26 0716) Weight:  [68.2 kg] 68.2 kg (07/26 0500)  Filed Weights   09/01/19 0432 09/02/19 0458 09/03/19 0500  Weight: 68.9 kg 72 kg 68.2 kg    Weight change: -3.8 kg   Hemodynamic parameters for last 24 hours: CVP:  [6 mmHg] 6 mmHg  Intake/Output from previous day: 07/25 0701 - 07/26 0700 In: 2683.3 [I.V.:181.4; NG/GT:1660; IV Piggyback:841.9] Out: 2130 [Urine:3570; Drains:155; Stool:550; Chest  Tube:490]  Intake/Output this shift: No intake/output data recorded.  Current Meds: Scheduled Meds: . amiodarone  200 mg Per Tube BID  . aspirin  81 mg Per Tube Daily  . chlorhexidine gluconate (MEDLINE KIT)  15 mL Mouth Rinse BID  . Chlorhexidine Gluconate Cloth  6 each Topical Daily  . cholestyramine  4 g Oral BID  . collagenase   Topical Daily  . feeding supplement (PROSource TF)  45 mL Per Tube Daily  . free water  100 mL Per Tube Q1H  . furosemide  80 mg Intravenous Q12H  . insulin aspart  0-24 Units Subcutaneous Q4H  . insulin aspart  3 Units Subcutaneous Q4H  . insulin detemir  12 Units Subcutaneous BID  . ipratropium  0.5 mg Nebulization TID BM  . levalbuterol  1.25 mg Nebulization TID BM  . mouth rinse  15 mL Mouth Rinse 10 times per day  . midodrine  15 mg Per Tube TID WC  . nutrition supplement (JUVEN)  1 packet Per Tube BID BM  . sodium chloride flush  3 mL Intravenous Q12H  . sodium chloride flush  5 mL Intracatheter Q8H  . sodium chloride HYPERTONIC  4 mL Nebulization BID  . thiamine injection  100 mg Intravenous Daily   Continuous Infusions: . sodium chloride Stopped (08/13/19 1631)  . bivalirudin (ANGIOMAX) infusion 0.5 mg/mL (Non-ACS indications) 0.055 mg/kg/hr (09/03/19 0600)  . dextrose 30 mL/hr at 09/03/19 0100  . feeding supplement (PIVOT 1.5 CAL) Stopped (09/02/19 1500)  . fluconazole (DIFLUCAN) IV Stopped (09/03/19 0350)  .  meropenem (MERREM) IV Stopped (09/03/19 0542)  . potassium chloride 10 mEq (09/03/19 0610)  . vancomycin Stopped (09/02/19 1121)   PRN Meds:.sodium chloride, acetaminophen (TYLENOL) oral liquid 160 mg/5 mL, dextrose, fentaNYL (SUBLIMAZE) injection, Tameshia Bonneville's butt cream, lip balm, metoprolol tartrate, ondansetron (ZOFRAN) IV, sodium chloride flush  General appearance: alert and mild distress Neurologic: not consistent following commands , moves all extemities  Heart: regular rate and rhythm, S1, S2 normal, no murmur, click, rub or  gallop Lungs: diminished breath sounds bibasilar Abdomen: soft, non-tender; bowel sounds normal; no masses,  no organomegaly Extremities: extremities normal, atraumatic, no cyanosis or edema and Homans sign is negative, no sign of DVT Wound: sterbal wound intact  Lab Results: CBC: Recent Labs    09/02/19 0248 09/03/19 0245  WBC 19.2* 17.2*  HGB 7.9* 8.3*  HCT 27.6* 28.9*  PLT 358 335   BMET:  Recent Labs    09/02/19 1825 09/03/19 0245  NA 146* 148*  K 4.0 3.3*  CL 115* 114*  CO2 23 25  GLUCOSE 104* 78  BUN 84* 79*  CREATININE 1.21 1.29*  CALCIUM 7.9* 8.2*    CMET: Lab Results  Component Value Date   WBC 17.2 (H) 09/03/2019   HGB 8.3 (L) 09/03/2019   HCT 28.9 (L) 09/03/2019   PLT 335 09/03/2019   GLUCOSE 78 09/03/2019   CHOL 203 (H) 07/27/2019   TRIG 213 (H) 08/27/2019   HDL 34 (L) 07/27/2019   LDLCALC 148 (H) 07/27/2019   ALT 137 (H) 09/03/2019   AST 142 (H) 09/03/2019   NA 148 (H) 09/03/2019   K 3.3 (L) 09/03/2019   CL 114 (H) 09/03/2019   CREATININE 1.29 (H) 09/03/2019   BUN 79 (H) 09/03/2019   CO2 25 09/03/2019   TSH 2.243 07/31/2019   INR 1.6 (H) 08/20/2019   HGBA1C 9.0 (H) 07/26/2019      PT/INR: No results for input(s): LABPROT, INR in the last 72 hours. Radiology: No results found.   Assessment/Plan: S/P Procedure(s) (LRB): REMOVAL OF IMPELLA LEFT VENTRICULAR ASSIST DEVICE (N/A) TRANSESOPHAGEAL ECHOCARDIOGRAM (TEE) (N/A) Bun/cr = 89 Replacing ng for meds pending peg tube tomorrow Remains  vent dependant  Central lines out currently   Grace Isaac 09/03/2019 7:28 AM

## 2019-09-03 NOTE — Progress Notes (Addendum)
Patient ID: Todd [name], male   DOB: 30-Apr-1955, 64 y.o.   MRN: [id number]     Advanced Heart Failure Rounding Note  PCP-Cardiologist: No primary care provider on file.   Subjective:    08/02/19 CABG 08/03/19 VF arrest. Chest open at bedside 08/03/19 Back to OR for ECMO cannulation 08/03/19 Washout out for tamponade 08/04/19 Repeat bedside washout for tamponade 08/06/19 To OR for decannulation and Impella 5.5 08/07/19 Underwent emergent bedside chest exploration at bedside for tamponade  08/10/19 Chest closed 08/16/19 HIT positive, bivalirudin begun 08/17/19 Impella removed 08/20/19 Tracheostomy 08/23/19 HIDA scan showed patent common bile duct (unable to visualize GB, unable to rule out acalculous cholecystitis).  08/24/19 cholecystostomy tube 08/30/19 chest CT suggestive of possible empyema 08/31/19 ECG with diffuse encephalopathy.  No events overnight.   Off milrinone and NE. No central line access, unable to check CVPs but wt trending down. Continue w/ edema. SCr stable at 1.3 on IV lasix.   Remains on vent through TC.   Tm 98.8 overnight. WBC trending down 25>>19>>17K.   Planning PEG tube placement tomorrow. TCTS and CCM  LTACH transfer.   ABx/antifungals: Meropenem and [name] stopped 7/14 Zosyn stopped 7/21.   Candida from BAL and blood culture, started on Diflucan.   Now on fluconazole, vanc/meropenem (restarted 08/30/19)    Objective:   Weight Range: 68.2 kg Body mass index is 22.2 kg/m.   Vital Signs:   Temp:  [98.1 F (36.7 C)-99.5 F (37.5 C)] 98.1 F (36.7 C) (07/26 0728) Pulse Rate:  [66-152] 74 (07/26 0800) Resp:  [13-28] 22 (07/26 0800) BP: (95-125)/(38-96) 115/50 (07/26 0700) SpO2:  [96 %-100 %] 100 % (07/26 0800) Arterial Line BP: (97-102)/(40-43) 101/43 (07/25 1100) FiO2 (%):  [40 %] 40 % (07/26 0716) Weight:  [68.2 kg] 68.2 kg (07/26 0500) Last BM Date: 09/02/19  Weight change: Filed Weights   09/01/19 0432 09/02/19 0458 09/03/19 0500  Weight: 68.9  kg 72 kg 68.2 kg    Intake/Output:   Intake/Output Summary (Last 24 hours) at 09/03/2019 0801 Last data filed at 09/03/2019 0600 Gross per 24 hour  Intake 2270.37 ml  Output 4515 ml  Net -2244.63 ml      Physical Exam    General:  Critically ill appearing WM laying in bed.. Jaundiced. Awake on vent through trach,  HEENT: normal, jaundiced  Neck: supple. + TC  Cor: PMI nondisplaced. Regular rate & rhythm. No rubs, gallops or murmurs. Sternal incision site ok  Lungs: coarse bilaterally  Abdomen: soft, nontender, nondistended. No hepatosplenomegaly. No bruits or masses. Good bowel sounds. + biliary drain  Extremities: no cyanosis, clubbing, rash, 2+ edema in thighs Neuro: Awake. Affect flat    Telemetry   NSR 70-80s Personally reviewed   Labs    CBC Recent Labs    09/02/19 0248 09/03/19 0245  WBC 19.2* 17.2*  NEUTROABS 16.5* 14.3*  HGB 7.9* 8.3*  HCT 27.6* 28.9*  MCV 108.2* 107.8*  PLT 358 622   Basic Metabolic Panel Recent Labs    09/01/19 1600 09/02/19 0249 09/02/19 1825 09/03/19 0245  NA 149*   < > 146* 148*  K 4.6   < > 4.0 3.3*  CL 117*   < > 115* 114*  CO2 26   < > 23 25  GLUCOSE 260*   < > 104* 78  BUN 80*   < > 84* 79*  CREATININE 1.12   < > 1.21 1.29*  CALCIUM 8.0*   < > 7.9* 8.2*  PHOS 4.1  --  4.3  --    < > = values in this interval not displayed.   Liver Function Tests Recent Labs    09/02/19 0249 09/02/19 0249 09/02/19 1825 09/03/19 0245  AST 125*  --   --  142*  ALT 125*  --   --  137*  ALKPHOS 393*  --   --  409*  BILITOT 7.5*  --   --  8.1*  PROT 5.4*  --   --  5.9*  ALBUMIN 1.6*   < > 1.6* 1.7*   < > = values in this interval not displayed.   No results for input(s): LIPASE, AMYLASE in the last 72 hours. Cardiac Enzymes No results for input(s): CKTOTAL, CKMB, CKMBINDEX, TROPONINI in the last 72 hours.  BNP: BNP (last 3 results) Recent Labs    07/26/19 1236  BNP 2,568.2*    ProBNP (last 3 results) No results for  input(s): PROBNP in the last 8760 hours.   D-Dimer No results for input(s): DDIMER in the last 72 hours. Hemoglobin A1C No results for input(s): HGBA1C in the last 72 hours. Fasting Lipid Panel No results for input(s): CHOL, HDL, LDLCALC, TRIG, CHOLHDL, LDLDIRECT in the last 72 hours. Thyroid Function Tests No results for input(s): TSH, T4TOTAL, T3FREE, THYROIDAB in the last 72 hours.  Invalid input(s): FREET3  Other results:   Imaging    No results found.   Medications:     Scheduled Medications: . amiodarone  200 mg Per Tube BID  . aspirin  81 mg Per Tube Daily  . chlorhexidine gluconate (MEDLINE KIT)  15 mL Mouth Rinse BID  . Chlorhexidine Gluconate Cloth  6 each Topical Daily  . cholestyramine  4 g Oral BID  . collagenase   Topical Daily  . feeding supplement (PROSource TF)  45 mL Per Tube Daily  . free water  100 mL Per Tube Q1H  . furosemide  80 mg Intravenous Q12H  . insulin aspart  0-24 Units Subcutaneous Q4H  . insulin aspart  3 Units Subcutaneous Q4H  . insulin detemir  12 Units Subcutaneous BID  . ipratropium  0.5 mg Nebulization TID BM  . levalbuterol  1.25 mg Nebulization TID BM  . mouth rinse  15 mL Mouth Rinse 10 times per day  . midodrine  15 mg Per Tube TID WC  . nutrition supplement (JUVEN)  1 packet Per Tube BID BM  . sodium chloride flush  3 mL Intravenous Q12H  . sodium chloride flush  5 mL Intracatheter Q8H  . thiamine injection  100 mg Intravenous Daily    Infusions: . sodium chloride Stopped (08/13/19 1631)  . bivalirudin (ANGIOMAX) infusion 0.5 mg/mL (Non-ACS indications) 0.055 mg/kg/hr (09/03/19 0600)  . dextrose 30 mL/hr at 09/03/19 0100  . feeding supplement (PIVOT 1.5 CAL) Stopped (09/02/19 1500)  . fluconazole (DIFLUCAN) IV Stopped (09/03/19 0350)  . meropenem (MERREM) IV Stopped (09/03/19 0542)  . potassium chloride 10 mEq (09/03/19 0610)  . vancomycin Stopped (09/02/19 1121)    PRN Medications: sodium chloride, acetaminophen  (TYLENOL) oral liquid 160 mg/5 mL, dextrose, fentaNYL (SUBLIMAZE) injection, Gerhardt's butt cream, lip balm, metoprolol tartrate, ondansetron (ZOFRAN) IV, sodium chloride flush   Assessment/Plan   1. Shock: Mixed cardiogenic/septic.  Echo with EF 20-25%, moderate RV dysfunction.  VA ECMO post-VF arrest, cannulated 6/25.  Stable s/p return to OR for mediastinal re-exploration due to bleeding on 6/25.  Decannulated from [place] ECMO on 08/06/19. Impella 5.5 placed. Underwent emergent  chest re-exploration on 6/29 for tamponade and clot removal. Chest closed 7/2.  Impella 5.5 removed 7/9.  Milrinone stopped 7/23 NE stopped overnight. Suspect primarily septic/vasodilatory shock with fever, elevated WBCs but down trending. No central line access to check CVPs but continues w/ edema. SCr stable.   -Continue IV Lasix 80 mg bid  -Continue midodrine 15 mg tid 2. 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  Prolonged VF arrest on 6/25. Chest closed 7/2.  - ASA  - Holding Crestor with elevated LFTs.  3. Cardiac arrest: VF arrest with prolonged code. Episodes of VT/NSVT in setting of low K (difficult to effectively replace). K now stable, minimal ectopy.  - Continue PO amio 200 mg bid.   - Can consider lifevest at d/c but doubt condition will permit - Keep K > 4.0 Mg > 2.0  - No change 4. Acute hypoxemic respiratory failure: In setting of cardiac arrest.  H/o COPD.  Bibasilar infiltrates, possible HCAP.  Now with tracheostomy. Diflucan with candida in BAL and blood culture.  More rhonchorous on exam.  - tracheal aspirate re cultured 7/22 growing rare candida tropicalis and albicans  - Vanc + meropenum restarted 7/23 - Chest CT 7/23 w/ concerns for ? Developing empyema.  - CCM following and have discussed further CT drainage with TCTS but have deferred 5. Acute blood loss anemia: Now s/p mediastinal re-exploration x 3. Hgb stable at 8.3 today.  - Now on bivalirudin gtt. -  Monitor. 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  Platelets have recovered.  7. ID: Suspect HCAP initially, on meropenem/anidulafungin/vanco initially with ongoing fevers and leukocytosis. Venous dopplers negative for DVT. Possible drug fever from meropenem, he was switched to Zosyn 7/14 - 7/21.  He had abdominal US with gallbladder sludge, no definite cholecystitis.  HIDA scan done, GB does not opacify => ?acalculous cholecystitis => now with cholecystostomy tube.  Most recently, Candida in blood culture and BAL, he is on Diflucan. Exam by ophthalmology => no candidal endophthalmitis.  Foley and PICC removed 7/21.  WBCs falling 27 -> 25 -> 19.2->17 K. Tm 98.8 currently. - ID following.  - C.diff negative - Currently on Diflucan. Vanc + meropenum restarted 7/22 -  tracheal aspirate re cultured yesterday. NGTD  - Chest CT w/ concerns for ? Developing empyema. CCM and TCTS following 8. Elevated bilirubin: Mostly direct = likely cholestatic/shock liver/RV failure predominantly.   Abdominal US with evidence for cirrhosis, sludge in GB but no definite acute cholecystitis. NH3 was not significantly elevated. Tbili down to 5.9. HIDA scan showed patent common bile duct, so not obstructive stone causing elevated bilirubin.  9. Ileus: Improved.  Getting TFs. Planning PEG placement tomorrow  10. Atrial fibrillation/flutter: Paroxysmal. In NSR today.     - Continue PO amio, 200 mg bid  - bivalirudin gtt. No bleeding 11. Neuro: CT head negative on 7/11. EEG with e/o diffuse encephalopathy.  - seems to lose eyes intermittently on command. O/w no meaningful interaction 12. Hypernatremia: Na 148  - Continue free water boluses 300 cc q4 hrs  13. Hypokalemia - K 3.3. Supp K    Lyda Jester, PA-C 09/03/2019 8:01 AM  Patient seen with PA, agree with the above note.   He is now afebrile, on meropenem/vancomycin/fluconazole.  Off milrinone and norepinephrine, getting  midodrine 15 mg tid.   General: NAD Neck: JVP 10 cm, no thyromegaly or thyroid nodule.  Lungs: Decreased at bases. CV:  Nondisplaced PMI.  Heart regular S1/S2, no S3/S4, no murmur.  1+ ankle edema.  Abdomen: Soft, nontender, no hepatosplenomegaly, no distention.  Skin: Jaundiced.  Neurologic: Alert and oriented x 3.  Psych: Normal affect. Extremities: No clubbing or cyanosis.  HEENT: Normal.   Continue fluconazole until 2 wks after lines pulled. Plan to stop meropenem today, ID to determine length of vancomycin.   Will need PEG tube tomorrow.  After PEG, will stop bivalirudin and start Eliquis (HIT).   Continue midodrine, MAP stable.   Volume overload by exam, would continue Lasix 80 mg IV bid x 1 more day then transition to per tube likely tomorrow.   CRITICAL CARE Performed by: Loralie Champagne  Total critical care time: 35 minutes  Critical care time was exclusive of separately billable procedures and treating other patients.  Critical care was necessary to treat or prevent imminent or life-threatening deterioration.  Critical care was time spent personally by me on the following activities: development of treatment plan with patient and/or surrogate as well as nursing, discussions with consultants, evaluation of patient's response to treatment, examination of patient, obtaining history from patient or surrogate, ordering and performing treatments and interventions, ordering and review of laboratory studies, ordering and review of radiographic studies, pulse oximetry and re-evaluation of patient's condition.  Loralie Champagne 09/03/2019 8:41 AM

## 2019-09-03 NOTE — Progress Notes (Signed)
eLink Physician-Brief Progress Note Patient Name: Todd [name] DOB: 25-Mar-1955 MRN: [id number]   Date of Service  09/03/2019  HPI/Events of Note  Hypoglycemia - Blood glucose = 58. Already given D50.  eICU Interventions  Plan: 1. Increase D10W IV infusion to 60 mL/hour.      Intervention Category Major Interventions: Other:  [name] 09/03/2019, 3:48 AM

## 2019-09-03 NOTE — TOC Initial Note (Signed)
Transition of Care [facility]) - Initial/Assessment Note    Patient Details  Name: Todd [name] MRN: [id number] Date of Birth: 06-12-1955  [facility] CM/SW Contact:    [name], RN Phone Number: 09/03/2019, 11:30 AM  Clinical Narrative:                 Case management spoke with the patient's wife, Leeandre Nordling and offered choice for the patient regarding LTAC placement.  The wife was interested in LTAC at [facility] and left a message with Farris Has, [facility] with [facility] and they have bed availability for the patient and plan to start insurance authorization after confirmation with the spouse as well.  Will continue to follow for LTAC placement.  Expected Discharge Plan: Long Term Acute Care (LTAC) Barriers to Discharge: Insurance Authorization   Patient Goals and CMS Choice Patient states their goals for this hospitalization and ongoing recovery are:: Patient's wife would like patient to transfer to [facility] CMS Medicare.gov Compare Post Acute Care list provided to:: Patient Represenative (must comment) (Patient's spouse - [name]) Choice offered to / list presented to : Spouse  Expected Discharge Plan and Services Expected Discharge Plan: Long Term Acute Care (LTAC)   Discharge Planning Services: CM Consult Post Acute Care Choice: Long Term Acute Care (LTAC) Living arrangements for the past 2 months: Single Family Home                                      Prior Living Arrangements/Services Living arrangements for the past 2 months: Single Family Home Lives with:: Spouse Patient language and need for interpreter reviewed:: Yes Do you feel safe going back to the place where you live?: Yes      Need for Family Participation in Patient Care: Yes (Comment) Care giver support system in place?: Yes (comment)   Criminal Activity/Legal Involvement Pertinent to Current Situation/Hospitalization: No - Comment as needed  Activities of  Daily Living Home Assistive Devices/Equipment: Cane (specify quad or straight) ADL Screening (condition at time of admission) Patient's cognitive ability adequate to safely complete daily activities?: Yes Is the patient deaf or have difficulty hearing?: Yes Does the patient have difficulty seeing, even when wearing glasses/contacts?: No Does the patient have difficulty concentrating, remembering, or making decisions?: Yes Patient able to express need for assistance with ADLs?: Yes Does the patient have difficulty dressing or bathing?: No Independently performs ADLs?: Yes (appropriate for developmental age) Does the patient have difficulty walking or climbing stairs?: No Weakness of Legs: None Weakness of Arms/Hands: None  Permission Sought/Granted Permission sought to share information with : Case Manager Permission granted to share information with : Yes, Verbal Permission Granted     Permission granted to share info w AGENCY: [name], RNCM with [facility]  Permission granted to share info w Relationship: Spouse - [name]     Emotional Assessment Appearance:: Appears stated age Attitude/Demeanor/Rapport: Gracious     Alcohol / Substance Use: Not Applicable Psych Involvement: No (comment)  Admission diagnosis:  CHF (congestive heart failure) (HCC) [I50.9] Acute on chronic congestive heart failure, unspecified heart failure type (HCC) [I50.9] Coronary artery disease [I25.10] Patient Active Problem List   Diagnosis Date Noted  . Loculated pleural effusion   . FUO (fever of unknown origin)   . Acalculous cholecystitis 08/27/2019  . ARDS (adult respiratory distress syndrome) (HCC)   . Cardiogenic shock (HCC)   .  Candidemia (HCC) 08/25/2019  . 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 Acute on chronic respiratory failure (HCC)   . Acute respiratory failure (HCC)   . Pressure injury of skin 08/12/2019  . Malnutrition of moderate degree 08/10/2019  . Coronary artery  disease 08/02/2019  . Coronary artery disease involving native coronary artery of native heart with unstable angina pectoris (HCC)   . Type 2 diabetes mellitus with hyperglycemia (HCC) 07/29/2019  . AKI (acute kidney injury) (HCC)   . CHF (congestive heart failure) (HCC) 07/26/2019  . Benign prostatic hyperplasia with lower urinary tract symptoms 07/26/2019  . Chronic pain syndrome 07/26/2019  . Essential hypertension 07/26/2019  . Elective surgery 07/26/2019  . Mixed hyperlipidemia 07/26/2019  . Moderate recurrent major depression (HCC) 07/26/2019  . Hypothyroidism 07/26/2019  . Acute systolic heart failure (HCC) 07/26/2019   PCP:  Galvin Proffer, MD Pharmacy:   [facility] [phone] - [place], [place] - [phone] AT [address] [facility] [address] [place] [address] Phone: [phone] Fax: [phone]     Social Determinants of Health (SDOH) Interventions    Readmission Risk Interventions Readmission Risk Prevention Plan 09/03/2019  Transportation Screening Complete  PCP or Specialist Appt within 3-5 Days Complete  HRI or Home Care Consult Complete  Social Work Consult for Recovery Care Planning/Counseling Complete  Palliative Care Screening Complete  Medication Review Oceanographer) Complete  Some recent data might be hidden

## 2019-09-03 NOTE — Progress Notes (Signed)
Pharmacy Antibiotic Note  Todd [name] is a 64 y.o. male admitted on 07/26/2019 with CHF exacerbation. 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 VT arrest on 6/25 and underwent VA ECMO. Decannulated on 6/28 and Impella placed. Chest closed on 7/2 and Imella removed 7/9. Cholecystostomy tube placed on 7/16.  Was on antibiotics initially for possible HCAP. No definite cholecystitis was seen on abd [place]. Now with candida in blood cultures and BAL. No growth from aspirated bile from perc-chole drain. PICC line and CL changed 7/21.  Pharmacy has been consulted for fluconazole for candidemia and add back vancomycin and meropenem. Meropenem stopped by ID today, vancomycin trough elevated at 22 mcg/ml (drawn ~3h late as well).  Plan: -Reduce vancomycin to 730m q24h -Continue fluconazole [id number]   Height: _0  (175.3 cm) Weight: 68.2 kg (150 lb 5.7 oz) IBW/kg (Calculated) : 70.7  Temp (24hrs), Avg:98.9 F (37.2 C), Min:98.1 F (36.7 C), Max:99.5 F (37.5 C)  Recent Labs  Lab 08/30/19 0939 08/30/19 1549 08/31/19 0230 08/31/19 1635 09/01/19 0431 09/01/19 1600 09/02/19 0248 09/02/19 0249 09/02/19 1825 09/03/19 0245  WBC 27.0*  --  27.0*  --  24.6*  --  19.2*  --   --  17.2*  CREATININE  --    < > 1.34*   < > 1.21 1.12  --  1.03 1.21 1.29*   < > = values in this interval not displayed.    Estimated Creatinine Clearance: 55.1 mL/min (A) (by C-G formula based on SCr of 1.29 mg/dL (H)).    Allergies  Allergen Reactions  . Empagliflozin     Other reaction(s): diarrhea  . 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 Other     Other reaction(s): Unknown  . Simvastatin     Other reaction(s): diarrhea  . Sitagliptin     Other reaction(s): diarrhea/abd pain    Antimicrobials this admission: Zosyn 7/14>>7/21 Fluconazole 7/17>> Cefuroxime 6/23>>6/25 Vancomycin 6/24>6/24; resume 6/26>>7/2, 7/5>>7/13, 7/22> Meropenem 6/26>>7/4, 7/5>>7/14,  7/22>7/26 [name] 7/7>>7/14  Dose adjustments thisdosing regimen: 6/30 VR 33, 7/1 22 (ke 0.026, T1/2 26.6) - change to vancomycin 1750 mg q36 7/7 VT = 19 (drawn prior to 3rd dose) - reduce to 1g IV every 24 hours 7/11 VT 21 - reduce to 750 q 24, retime for later tonight>start 7/12 AM  Microbiology results: 7/3+7/5 TA - few yeast> few candida topicalis  7/5 + 7/7 Bcx: neg 7/7 Ucx: neg 7/9 Wound Cx: neg 7/12 BAL: candida albicans, candida tropicalis 7/14 pleural fluid: rare candida parapsilosis 7/14 Blood>>>candida parapsilosis  7/16 abscess: ngF 7/17 BCx: ngtd 7/22 Bcx: ngtd  7/22 TA: no orgs seen >> reincubating  MiArrie SenatePharmD, BCPS Clinical Pharmacist [phone] check AMION for all [facility] 09/03/2019

## 2019-09-03 NOTE — Progress Notes (Signed)
NAME:  Todd [name], MRN:  [id number], DOB:  06-11-1955, LOS: 78 ADMISSION DATE:  07/26/2019, CONSULTATION DATE:  08/03/19 REFERRING MD:  Roxan Hockey, CHIEF COMPLAINT:  ECMO   Brief History   64 yo male admitted with CHF exacerbation, found to have multi-vessel disease requiring CABG 6/24, post op VT Arrest 6/25 and cannulated for VA ECMO.    History of present illness   Presented with worsening dyspnea 6/17 c/w CHF exacerbation.  LHC  with severe triple vessel disease.  Underwent CABG 6/24.  Vtach arrest 6/25.  Chest opened bedside and cardiac massage initiated as well as multiple cardioversions, amiodarone, bicarb etc.  Brought to OR and cannulated for VA ECMO.  PCCM consulted to assist with management  Comorbidities include DM, heavy smoking, COPD  Past Medical History  Depression Ischemic cardiomyopathy HTN HLD  Significant Hospital Events   6/24 CABG 6/25 VA cannulation 6/28 decannulated and impella placed 6/29 bedside re-exploration; s/p decannulation. Placement of impella device originally at p8 but decreased to p4 2/2 suction events overnight up to p6. Echo completed and repositioned device. Remained on considerable support with 8 epi and 46 norepi vaso 0.05. continued chest tube output with large clots noted. Heparin thru device. Replacement products ongoing. 60% 8 peep 6/30 bedside mediastinal re-exploration and clean out of hematoma with tamponade and worsening hemodynamics. Pt had large volume transfusion. rebolused with amio 2/2 nsvt episode.  Weight up 48 pounds.  Started diuresis, Lasix drip started. 7/01 iatrogenic respiratory alkalosis, vent rate decreased. 7/02 No significant issues overnight remains on pressors and Impella not tolerating tube feeds with high gastric output awaiting core track placement 7/03 started trickle TF  7/05 Swab removed, CVL and PICC placed. 7/13 Trach and BAL 7/16 Awake this AM and tracking. No follow commands. Still having fevers. Successful  ultrasound and CT guided placement of a 10.2 French cholecystostomy tube. A small amount of aspirated bile was capped and sent to the laboratory for analysis 7/17 Off sedation gtt. doing pressure support ventilation.  Beginning to follow some commands occasionally intermittently.  Very jaundiced.  Growing Candida in the blood and BAL.  Still febrile but T-max is coming down.  WBC persistently elevated; currently at 19.4 okay.  Diflucan started. Currently on milrinone GTT, amiodarone gtt., vasopressin gtt., Levophed gtt. and bivalrudin gtt. Off epi gtt. Cards planning lasix x 1 today. 7/21 Ongoing fevers, WBC increased 7/22: Persistent fevers, pan cultured with CT Chest/Head. Suspected source tracheobronchitis.   Consults:  Advanced heart failure, PCCM, TCTS  Procedures:  LUE PICC 7/5 >>7/21 R Brachial ALine 7/9 >> Chest Tubes >> L IJ CVC 7/21 >>  Significant Diagnostic Tests:  6/22 spirometry with restrictive physiology, preserved FEV1/FVC 6/18 echo: LVEF 77-41%, grade I diastolic dysfunction 2/87 US Abdomen cholelithiasis without cholecystitis. Mild wall thinking in setting of liver disease and ascites.  7/10: LE [place] negative for DVT  7/20 CT ABD w contrast >> stable position of the percutaneous cholecystostomy tube with complete decompression of the gallbladder, trace residual free fluid within the RUQ, decreased since prior, trace bilateral pleural effusions L>R, scattered areas of ground glass airspace disease within the RML, RLL 7/22: CT Chest w/o Contrast: Retrosternal fluid collection, non specific.  Bilateral pleural effusions, portions loculated.   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 Chronic microvascular angiopathy. Stable bilateral mastoid and middle ear effusions.   Micro Data:  COVID and HIV 6/17 >> Neg 7/3 respiratory >> few Candida tropicalis 7/5 blood >>  negative  7/5 respiratory >> few Candida tropicalis 7/7 urine >>  negative 7/7 blood >> negative  7/9 wound >> negative 7/12 BAL >> candida tropicalis, candida albicans 7/14 BCID >> candida parapsilosis   7/14 BCx2 >> candida parapsilosis  7/17 BCx2 >> NGTD 7/21 BCx2 >> NGTD 7/22 Respiratory Cx >> candida 7/24 c diff neg  Antimicrobials:  Vanc 6/24>>7/13 Cefuroxime 6/24 >> 6/25 merrem 6/26 >> 7/14 Anidulafungin 7/8 >> 7/14 Zosyn 7/14 >>7/21 Diflucan 7/17 >> Vanco 7/22 >> Mero 7/22 >>  Interim history/subjective:   Afebrile last 24 hours Hemodynamically stable, remains on bivalirudin Weaned on pressure support 12/5 over the weekend Good urine output with Lasix   Objective   Blood pressure (!) 112/52, pulse 74, temperature 98.1 F (36.7 C), resp. rate 22, height _0  (1.753 m), weight 68.2 kg, SpO2 100 %.    Vent Mode: CPAP;PSV FiO2 (%):  [40 %] 40 % Set Rate:  [20 bmp] 20 bmp Vt Set:  [420 mL] 420 mL PEEP:  [5 cmH20] 5 cmH20 Pressure Support:  [12 cmH20] 12 cmH20 Plateau Pressure:  [17 cmH20-20 cmH20] 17 cmH20   Intake/Output Summary (Last 24 hours) at 09/03/2019 1610 Last data filed at 09/03/2019 0600 Gross per 24 hour  Intake 2270.37 ml  Output 4515 ml  Net -2244.63 ml   Filed Weights   09/01/19 0432 09/02/19 0458 09/03/19 0500  Weight: 68.9 kg 72 kg 68.2 kg   Exam General: Adult male with trach and mechanical ventilator , appears chronically ill, deconditioned HEENT: MM dry, old blood noted, trach midline #8 Neuro -awake follows one-step commands, weak CV: s1s2 RRR, SR on monitor, no m/r/g, midline sternal wound with staples, chest tubes x2, serous drainage PULM: Mild accessory muscle use, decreased breath sounds on left base GI: soft, bsx4 active, cortrak in place  Extremities: warm/dry, no edema  Skin: no rashes or lesions on exposed skin.  Sacral decubitus approximately 3x3 inch site, appears healthy  Chest x-ray 7/25 personally reviewed, bilateral chest tubes, left lower lobe airspace disease/small bilateral  effusions.  Labs show increase in creatinine from 1.0-1.2 , mild hyponatremia, hypokalemia, slight decrease in leukocytosis, stable anemia.   Resolved Hospital Problem list     Assessment & Plan:   Critically ill due to cardiogenic/hemorrhagic shock status post CABG complicated by VT arrest,  S/p VA ECMO (decanulated 6/28). S/p impella placement (6/28> 7/9) Vasopressors are off. - Remove central line and arterial line.  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  -Continues to have tenacious secretions. ct CPT, Start hypertonic nebs.  -Slow wean, tolerating pressure support 12/5 today, try to be more aggressive but will likely be prolonged wean given severe deconditioning  Deconditioning of Critical Illness  --Can initiate PT/OT  Hyperbilirubinemia due to acalculous cholecystitis Possible cirrhosis per 7/8 [place]  Status post  percutaneous GB drain  --ct  GB drain --LFTs and bili are stable, alk phos remains high?  Cholestasis  T2DM --Hypoglycemic last 24 hours, discontinue tube feed coverage, decrease Levemir to 8 units BID  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 --continue diflucan, will need 2 weeks antifungal coverage --Ophthamology has evaluated this admission --All lines out  Acute blood loss anemia and critical illness anemia Heparin-induced thrombocytopenia --Bivalirudin  --  Hgb stable, no evidence of bleeding  Acute kidney injury Hypernatremia/hypokalemia  -Likely Lasix induced, would stop for next 24 hours   Acute encephalopathy  --CTH without acute intracranial pathology --Minimize sedating medications --Delirium prevention measures --EEG neg 7/23  --Add  thiamine  Sacral Decubitus  --Wound care, appears healthy  Small left adrenal mass seen on CT scan of the abdomen August 24, 2019  Fever ,suspected HAP, empyema --dc Vanc , ct Mero 7/22 x 7ds -- Consider bilateral drainage of possible loculated effusions if fevers recur.   Daily Goals Checklist  Pain/Anxiety/Delirium protocol (if indicated): fentanyl as needed only VAP protocol (if indicated): bundle in place.  DVT prophylaxis: Bivalirudin per pharmacy  Nutrition Status: High nutritional risk. Continue tube feeds.  For IR PEG GI prophylaxis: PPI Urinary catheter: n/a Central lines: L IJ CVC, Art Glucose control: See above Mobility/therapy needs: PT for ROM Code Status: Full  Family Communication: per primary Disposition: ICU, Anticipate LTACH need.    Kara Mead MD. [facility] Flood. [facility]  If no response to pager , please call [phone]   09/03/2019

## 2019-09-03 NOTE — Progress Notes (Signed)
TCTS BRIEF SICU PROGRESS NOTE  17 Days Post-Op  S/P Procedure(s) (LRB): REMOVAL OF IMPELLA LEFT VENTRICULAR ASSIST DEVICE (N/A) TRANSESOPHAGEAL ECHOCARDIOGRAM (TEE) (N/A)   Stable day NSR w/ stable BP O2 sats 100% on 40% FiO2 UOP adequate  Plan: Continue current plan.  For PEG tomorrow  Purcell Nails, MD 09/03/2019 6:12 PM

## 2019-09-03 NOTE — Progress Notes (Signed)
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+  Labs: Recent Labs    09/01/19 0431 09/01/19 0431 09/01/19 1600 09/01/19 1600 09/02/19 0248 09/02/19 0249 09/02/19 1825 09/03/19 0245  HGB 8.3*   < > 7.8*   < > 7.9*  --   --  8.3*  HCT 29.0*   < > 28.3*  --  27.6*  --   --  28.9*  PLT 378  --   --   --  358  --   --  335  APTT 64*  --   --   --  57*  --   --  56*  CREATININE 1.21   < > 1.12   < >  --  1.03 1.21 1.29*   < > = values in this interval not displayed.    Assessment: 64 yo male s/p ECMO and Impella. 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  Bivalirudin is therapeutic at 56 seconds on 0.055 mg/kg/hr. Hgb up slightly, pltc normalized.   Goals of Therapy: APTT 50-85 secs  Plan: Continue bivalirudin at 0.055 mg/kg/hr Monitor daily aPTT, CBC, and for s/sx of bleeding  Fredonia Highland, PharmD, BCPS Clinical Pharmacist [phone] Please check AMION for all [facility] Pharmacy numbers 09/03/2019

## 2019-09-04 ENCOUNTER — Inpatient Hospital Stay (HOSPITAL_COMMUNITY): Payer: Medicare Other

## 2019-09-04 DIAGNOSIS — Z930 Tracheostomy status: Secondary | ICD-10-CM | POA: Diagnosis not present

## 2019-09-04 DIAGNOSIS — N179 Acute kidney failure, unspecified: Secondary | ICD-10-CM | POA: Diagnosis not present

## 2019-09-04 DIAGNOSIS — J9622 Acute and chronic respiratory failure with hypercapnia: Secondary | ICD-10-CM | POA: Diagnosis not present

## 2019-09-04 DIAGNOSIS — K819 Cholecystitis, unspecified: Secondary | ICD-10-CM | POA: Diagnosis not present

## 2019-09-04 HISTORY — PX: IR GASTROSTOMY TUBE MOD SED: IMG625

## 2019-09-04 LAB — CBC WITH DIFFERENTIAL/PLATELET
Abs Immature Granulocytes: 0.13 10*3/uL — ABNORMAL HIGH (ref 0.00–0.07)
Basophils Absolute: 0.1 10*3/uL (ref 0.0–0.1)
Basophils Relative: 0 %
Eosinophils Absolute: 0.7 10*3/uL — ABNORMAL HIGH (ref 0.0–0.5)
Eosinophils Relative: 4 %
HCT: 27.2 % — ABNORMAL LOW (ref 39.0–52.0)
Hemoglobin: 7.6 g/dL — ABNORMAL LOW (ref 13.0–17.0)
Immature Granulocytes: 1 %
Lymphocytes Relative: 9 %
Lymphs Abs: 1.5 10*3/uL (ref 0.7–4.0)
MCH: 30.4 pg (ref 26.0–34.0)
MCHC: 27.9 g/dL — ABNORMAL LOW (ref 30.0–36.0)
MCV: 108.8 fL — ABNORMAL HIGH (ref 80.0–100.0)
Monocytes Absolute: 0.9 10*3/uL (ref 0.1–1.0)
Monocytes Relative: 5 %
Neutro Abs: 14.1 10*3/uL — ABNORMAL HIGH (ref 1.7–7.7)
Neutrophils Relative %: 81 %
Platelets: 289 10*3/uL (ref 150–400)
RBC: 2.5 MIL/uL — ABNORMAL LOW (ref 4.22–5.81)
RDW: 23.3 % — ABNORMAL HIGH (ref 11.5–15.5)
WBC: 17.3 10*3/uL — ABNORMAL HIGH (ref 4.0–10.5)
nRBC: 0.1 % (ref 0.0–0.2)

## 2019-09-04 LAB — BASIC METABOLIC PANEL
Anion gap: 10 (ref 5–15)
BUN: 83 mg/dL — ABNORMAL HIGH (ref 8–23)
CO2: 22 mmol/L (ref 22–32)
Calcium: 8 mg/dL — ABNORMAL LOW (ref 8.9–10.3)
Chloride: 111 mmol/L (ref 98–111)
Creatinine, Ser: 1.16 mg/dL (ref 0.61–1.24)
GFR calc Af Amer: >60 mL/min (ref >60)
GFR calc non Af Amer: >60 mL/min (ref >60)
Glucose, Bld: 187 mg/dL — ABNORMAL HIGH (ref 70–99)
Potassium: 4.4 mmol/L (ref 3.5–5.1)
Sodium: 143 mmol/L (ref 135–145)

## 2019-09-04 LAB — GLUCOSE, CAPILLARY
Glucose-Capillary: 128 mg/dL — ABNORMAL HIGH (ref 70–99)
Glucose-Capillary: 92 mg/dL (ref 70–99)
Glucose-Capillary: 86 mg/dL (ref 70–99)
Glucose-Capillary: 63 mg/dL — ABNORMAL LOW (ref 70–99)
Glucose-Capillary: 122 mg/dL — ABNORMAL HIGH (ref 70–99)
Glucose-Capillary: 201 mg/dL — ABNORMAL HIGH (ref 70–99)

## 2019-09-04 LAB — PROTIME-INR
INR: 1.4 — ABNORMAL HIGH (ref 0.8–1.2)
Prothrombin Time: 17.1 seconds — ABNORMAL HIGH (ref 11.4–15.2)

## 2019-09-04 LAB — APTT: aPTT: 54 seconds — ABNORMAL HIGH (ref 24–36)

## 2019-09-04 MED ORDER — GLUCAGON HCL RDNA (DIAGNOSTIC) 1 MG IJ SOLR
INTRAMUSCULAR | Status: AC | PRN
  Administered 2019-09-04: .5 mg via INTRAVENOUS

## 2019-09-04 MED ORDER — MIDAZOLAM HCL 2 MG/2ML IJ SOLN
INTRAMUSCULAR | Status: AC | PRN
  Administered 2019-09-04: 1 mg via INTRAVENOUS

## 2019-09-04 MED ORDER — FUROSEMIDE 10 MG/ML IJ SOLN
40.0000 mg | Freq: Once | INTRAMUSCULAR | Status: AC
  Administered 2019-09-04: 40 mg via INTRAVENOUS
  Filled 2019-09-04: qty 4

## 2019-09-04 MED ORDER — CEFAZOLIN SODIUM-DEXTROSE 2-4 GM/100ML-% IV SOLN
2.0000 g | INTRAVENOUS | Status: AC
  Filled 2019-09-04: qty 100

## 2019-09-04 MED ORDER — FENTANYL CITRATE (PF) 100 MCG/2ML IJ SOLN
INTRAMUSCULAR | Status: AC | PRN
  Administered 2019-09-04: 50 ug via INTRAVENOUS

## 2019-09-04 MED ORDER — MIDAZOLAM HCL 2 MG/2ML IJ SOLN
INTRAMUSCULAR | Status: AC
  Filled 2019-09-04: qty 2

## 2019-09-04 MED ORDER — CEFAZOLIN SODIUM-DEXTROSE 2-4 GM/100ML-% IV SOLN
INTRAVENOUS | Status: AC
  Administered 2019-09-04: 2 g via INTRAVENOUS
  Filled 2019-09-04: qty 100

## 2019-09-04 MED ORDER — GLUCAGON HCL RDNA (DIAGNOSTIC) 1 MG IJ SOLR
INTRAMUSCULAR | Status: AC
  Filled 2019-09-04: qty 1

## 2019-09-04 MED ORDER — IOHEXOL 300 MG/ML  SOLN
50.0000 mL | Freq: Once | INTRAMUSCULAR | Status: AC | PRN
  Administered 2019-09-04: 20 mL

## 2019-09-04 MED ORDER — INSULIN DETEMIR 100 UNIT/ML ~~LOC~~ SOLN
6.0000 [IU] | Freq: Two times a day (BID) | SUBCUTANEOUS | Status: DC
  Administered 2019-09-04: 6 [IU] via SUBCUTANEOUS
  Filled 2019-09-04 (×4): qty 0.06

## 2019-09-04 MED ORDER — PANTOPRAZOLE SODIUM 40 MG PO PACK
40.0000 mg | PACK | Freq: Every day | ORAL | Status: DC
  Administered 2019-09-05 – 2019-09-16 (×12): 40 mg
  Filled 2019-09-04 (×14): qty 20

## 2019-09-04 MED ORDER — LIDOCAINE HCL (PF) 1 % IJ SOLN
INTRAMUSCULAR | Status: AC | PRN
  Administered 2019-09-04: 10 mL

## 2019-09-04 MED ORDER — FENTANYL CITRATE (PF) 100 MCG/2ML IJ SOLN
INTRAMUSCULAR | Status: AC
  Filled 2019-09-04: qty 2

## 2019-09-04 MED ORDER — LIDOCAINE HCL 1 % IJ SOLN
INTRAMUSCULAR | Status: AC
  Filled 2019-09-04: qty 20

## 2019-09-04 NOTE — Progress Notes (Signed)
Pt transported from 2H 02 to IR and back on the ventilator without incident.

## 2019-09-04 NOTE — Progress Notes (Addendum)
NAME:  Todd [name], MRN:  [id number], DOB:  July 20, 1955, LOS: 74 ADMISSION DATE:  07/26/2019, CONSULTATION DATE:  08/03/19 REFERRING MD:  Roxan Hockey, CHIEF COMPLAINT:  ECMO   Brief History   64 yo male admitted with CHF exacerbation, found to have multi-vessel disease requiring CABG 6/24, post op VT Arrest 6/25 and cannulated for VA ECMO.    History of present illness   Presented with worsening dyspnea 6/17 c/w CHF exacerbation.  LHC  with severe triple vessel disease.  Underwent CABG 6/24.  Vtach arrest 6/25.  Chest opened bedside and cardiac massage initiated as well as multiple cardioversions, amiodarone, bicarb etc.  Brought to OR and cannulated for VA ECMO.  PCCM consulted to assist with management  Comorbidities include DM, heavy smoking, COPD  Past Medical History  Depression Ischemic cardiomyopathy HTN HLD  Significant Hospital Events   6/24 CABG 6/25 VA cannulation 6/28 decannulated and impella placed 6/29 bedside re-exploration; s/p decannulation. Placement of impella device originally at p8 but decreased to p4 2/2 suction events overnight up to p6. Echo completed and repositioned device. Remained on considerable support with 8 epi and 46 norepi vaso 0.05. continued chest tube output with large clots noted. Heparin thru device. Replacement products ongoing. 60% 8 peep 6/30 bedside mediastinal re-exploration and clean out of hematoma with tamponade and worsening hemodynamics. Pt had large volume transfusion. rebolused with amio 2/2 nsvt episode.  Weight up 48 pounds.  Started diuresis, Lasix drip started. 7/01 iatrogenic respiratory alkalosis, vent rate decreased. 7/02 No significant issues overnight remains on pressors and Impella not tolerating tube feeds with high gastric output awaiting core track placement 7/03 started trickle TF  7/05 Swab removed, CVL and PICC placed. 7/13 Trach and BAL 7/16 Awake this AM and tracking. No follow commands. Still having fevers. Successful  ultrasound and CT guided placement of a 10.2 French cholecystostomy tube. A small amount of aspirated bile was capped and sent to the laboratory for analysis 7/17 Off sedation gtt. doing pressure support ventilation.  Beginning to follow some commands occasionally intermittently.  Very jaundiced.  Growing Candida in the blood and BAL.  Still febrile but T-max is coming down.  WBC persistently elevated; currently at 19.4 okay.  Diflucan started. Currently on milrinone GTT, amiodarone gtt., vasopressin gtt., Levophed gtt. and bivalrudin gtt. Off epi gtt. Cards planning lasix x 1 today. 7/21 Ongoing fevers, WBC increased 7/22: Persistent fevers, pan cultured with CT Chest/Head. Suspected source tracheobronchitis.   Consults:  Advanced heart failure, PCCM, TCTS  Procedures:  LUE PICC 7/5 >>7/21 R Brachial ALine 7/9 >> Chest Tubes >> L IJ CVC 7/21 >>  Significant Diagnostic Tests:  6/22 spirometry with restrictive physiology, preserved FEV1/FVC 6/18 echo: LVEF 79-39%, grade I diastolic dysfunction 0/30 US Abdomen cholelithiasis without cholecystitis. Mild wall thinking in setting of liver disease and ascites.  7/10: LE [place] negative for DVT  7/20 CT ABD w contrast >> stable position of the percutaneous cholecystostomy tube with complete decompression of the gallbladder, trace residual free fluid within the RUQ, decreased since prior, trace bilateral pleural effusions L>R, scattered areas of ground glass airspace disease within the RML, RLL 7/22: CT Chest w/o Contrast: Retrosternal fluid collection, non specific.  Bilateral pleural effusions, portions loculated.   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 Chronic microvascular angiopathy. Stable bilateral mastoid and middle ear effusions.   Micro Data:  COVID and HIV 6/17 >> Neg 7/3 respiratory >> few Candida tropicalis 7/5 blood >>  negative  7/5 respiratory >> few Candida tropicalis 7/7 urine >>  negative 7/7 blood >> negative  7/9 wound >> negative 7/12 BAL >> candida tropicalis, candida albicans 7/14 BCID >> candida parapsilosis   7/14 BCx2 >> candida parapsilosis  7/17 BCx2 >> NGTD 7/21 BCx2 >> NGTD 7/22 Respiratory Cx >> candida 7/24 c diff neg  Antimicrobials:  Vanc 6/24>>7/13 Cefuroxime 6/24 >> 6/25 merrem 6/26 >> 7/14 Anidulafungin 7/8 >> 7/14 Zosyn 7/14 >>7/21 Diflucan 7/17 >> Vanco 7/22 >> Mero 7/22 >>  Interim history/subjective:   T max 100.5 last 24 hours WBC 17.3 ( 17.2 on 7/26) Hemodynamically stable, remains on bivalirudin Net negative 7 L  Since admission, + 1.25 L last 24 hours Weaned on pressure support 12/5 over the weekend Copious secretions Jaundiced May need drainage of empyema and cultures as new fevers   Objective   Blood pressure (!) 113/45, pulse 66, temperature 99.8 F (37.7 C), temperature source Oral, resp. rate 20, height _0  (1.753 m), weight 68.3 kg, SpO2 100 %.    Vent Mode: PRVC FiO2 (%):  [40 %] 40 % Set Rate:  [20 bmp] 20 bmp Vt Set:  [420 mL] 420 mL PEEP:  [5 cmH20] 5 cmH20 Plateau Pressure:  [14 cmH20-25 cmH20] 18 cmH20   Intake/Output Summary (Last 24 hours) at 09/04/2019 1150 Last data filed at 09/04/2019 1100 Gross per 24 hour  Intake 2172.96 ml  Output 1625 ml  Net 547.96 ml   Filed Weights   09/02/19 0458 09/03/19 0500 09/04/19 0400  Weight: 72 kg 68.2 kg 68.3 kg   Exam General: Adult male with trach and mechanical ventilator , appears chronically ill, deconditioned, awake at present HEENT: NCAT, MM dry, old blood noted, trach midline #8 Neuro -awake follows one-step commands, weak CV: s1s2 RRR, SR on monitor, no m/r/g, midline sternal wound with staples, chest tubes x2, serous drainage PULM: Bilateral chest excursion, Coarse rhonchi throughout,, decreased breath sounds on left base GI: soft, bsx4 active, cortrak in place  Extremities: warm/dry, no edema , brisk cap refill Skin: no rashes or lesions on  exposed skin. Sternal wound with staples,  Sacral decubitus approximately 3x3 inch site, Appears jaundiced   Resolved Hospital Problem list     Assessment & Plan:   Critically ill due to cardiogenic/hemorrhagic shock status post CABG complicated by VT arrest,  S/p VA ECMO (decanulated 6/28). S/p impella placement (6/28> 7/9) Vasopressors are off. - Remove central line and arterial line.  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  -Continues to have tenacious secretions. Continue Chest PT -Slow wean, tolerating pressure support 12/5 on 7/26, but not tolerating  7/27. will try to be more aggressive but will likely be prolonged wean given severe deconditionin  Deconditioning of Critical Illness  --Continue  initiate PT/OT  Hyperbilirubinemia due to acalculous cholecystitis Possible cirrhosis per 7/8 [place]  Status post  percutaneous GB drain  Remains jaundiced --ct  GB drain --LFTs and bili and bili , alk phos remains high>> continue to trend  T2DM --Hypoglycemic last 24 hours, tube feed coverage disconrinued, decrease Levemir to 8 units BID - BS low this am as TF on hold for PEG placement - Nursing will resume D10 as needed.  - For  PEG placement between 2 pm and 3pm.  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 --continue diflucan, will need 2 weeks antifungal coverage --Ophthamology has evaluated this admission --All lines out  Acute blood loss anemia and critical illness anemia Heparin-induced thrombocytopenia>> resolved --Bivalirudin  --Hgb 7.6 , no evidence of bleeding - trend CBC - Monitor for bleeding - transfuse as needed for HGB < 7  Acute kidney  injury Hypernatremia/hypokalemia>> resolved - Trend BMET - Replete electrolytes as needed    Acute encephalopathy  --CTH without acute intracranial pathology --Minimize sedating medications --Delirium prevention measures --EEG neg 7/23  --Add thiamine  Sacral Decubitus  --Wound care, appears healthy  Small left adrenal mass seen on CT scan of the abdomen August 24, 2019  Fever ,suspected HAP, empyema T Max 7/27 100.5 Need to consider drainage of empyema.for culture  -- restarting 14 days of vanc per ID. On 7/27 , continuing diflucan -- Consider bilateral drainage of possible loculated effusions if fevers recur.   Daily Goals Checklist  Pain/Anxiety/Delirium protocol (if indicated): fentanyl as needed only VAP protocol (if indicated): bundle in place.  DVT prophylaxis: Bivalirudin per pharmacy  Nutrition Status: High nutritional risk. Continue tube feeds.  For IR PEG GI prophylaxis: PPI Urinary catheter: n/a Central lines: L IJ CVC, Art Glucose control: See above Mobility/therapy needs: PT for ROM Code Status: Full  Family Communication: per primary Disposition: ICU, Anticipate LTACH need.   PCCM APP CC Time 30 minutes  Magdalen Spatz, MSN, AGACNP-BC [place] for personal pager PCCM on call pager [phone] 09/04/2019 1:00 PM

## 2019-09-04 NOTE — Progress Notes (Addendum)
Patient ID: Todd [name], male   DOB: March 05, 1955, 64 y.o.   MRN: [id number]     Advanced Heart Failure Rounding Note  PCP-Cardiologist: No primary care provider on file.   Subjective:    08/02/19 CABG 08/03/19 VF arrest. Chest open at bedside 08/03/19 Back to OR for ECMO cannulation 08/03/19 Washout out for tamponade 08/04/19 Repeat bedside washout for tamponade 08/06/19 To OR for decannulation and Impella 5.5 08/07/19 Underwent emergent bedside chest exploration at bedside for tamponade  08/10/19 Chest closed 08/16/19 HIT positive, bivalirudin begun 08/17/19 Impella removed 08/20/19 Tracheostomy 08/23/19 HIDA scan showed patent common bile duct (unable to visualize GB, unable to rule out acalculous cholecystitis).  08/24/19 cholecystostomy tube 08/30/19 chest CT suggestive of possible empyema 08/31/19 ECG with diffuse encephalopathy.  Remains on vent through TC.   -1.7L in UOP yesterday w/ IV Lasix. Wt unchanged, still 150 lb. No CVP set up. SCr trending down, 1.30>>1.16  Tm past 24 hrs 100.5. WBC unchanged at 17K.   Getting PEG tube placed today.    ABx/antifungals: Meropenem and [name] stopped 7/14  Zosyn stopped 7/21.   Meropenem restarted 7/22 Candida from BAL and blood culture, started on fluconazole.   Now on fluconazole and vanc. Meropenem discontinued 7/26    Objective:   Weight Range: 68.3 kg Body mass index is 22.24 kg/m.   Vital Signs:   Temp:  [98.3 F (36.8 C)-100.5 F (38.1 C)] 99.8 F (37.7 C) (07/27 0729) Pulse Rate:  [63-83] 72 (07/27 0729) Resp:  [17-26] 26 (07/27 0729) BP: (104-129)/(49-65) 111/56 (07/27 0700) SpO2:  [97 %-100 %] 100 % (07/27 0729) FiO2 (%):  [40 %] 40 % (07/27 0729) Weight:  [68.3 kg] 68.3 kg (07/27 0400) Last BM Date: 09/04/19  Weight change: Filed Weights   09/02/19 0458 09/03/19 0500 09/04/19 0400  Weight: 72 kg 68.2 kg 68.3 kg    Intake/Output:   Intake/Output Summary (Last 24 hours) at 09/04/2019 0733 Last data filed at  09/04/2019 0700 Gross per 24 hour  Intake 3672.56 ml  Output 2415 ml  Net 1257.56 ml      Physical Exam    General:  ill appearing. Jaundiced. Awake on vent through trach  HEENT: normal, jaundiced  Neck: supple. + TC  Cor: PMI nondisplaced. Regular rate & rhythm. No rubs, gallops or murmurs. Sternal incision site ok + CTs  Lungs: coarse bilaterally  Abdomen: soft, nontender, nondistended. No hepatosplenomegaly. No bruits or masses. Good bowel sounds. + biliary drain  Extremities: no cyanosis, clubbing, rash, 2+ edema in thighs Neuro: Awake. Affect flat    Telemetry   NSR 70. No arrthymias Personally reviewed   Labs    CBC Recent Labs    09/03/19 0245 09/04/19 0049  WBC 17.2* 17.3*  NEUTROABS 14.3* 14.1*  HGB 8.3* 7.6*  HCT 28.9* 27.2*  MCV 107.8* 108.8*  PLT 335 301   Basic Metabolic Panel Recent Labs    09/01/19 1600 09/02/19 0249 09/02/19 1825 09/03/19 0245 09/03/19 1311 09/04/19 0049  NA 149*   < > 146*   < > 145 143  K 4.6   < > 4.0   < > 4.7 4.4  CL 117*   < > 115*   < > 111 111  CO2 26   < > 23   < > 26 22  GLUCOSE 260*   < > 104*   < > 180* 187*  BUN 80*   < > 84*   < > 73* 83*  CREATININE  1.12   < > 1.21   < > 1.30* 1.16  CALCIUM 8.0*   < > 7.9*   < > 8.0* 8.0*  PHOS 4.1  --  4.3  --   --   --    < > = values in this interval not displayed.   Liver Function Tests Recent Labs    09/02/19 0249 09/02/19 0249 09/02/19 1825 09/03/19 0245  AST 125*  --   --  142*  ALT 125*  --   --  137*  ALKPHOS 393*  --   --  409*  BILITOT 7.5*  --   --  8.1*  PROT 5.4*  --   --  5.9*  ALBUMIN 1.6*   < > 1.6* 1.7*   < > = values in this interval not displayed.   No results for input(s): LIPASE, AMYLASE in the last 72 hours. Cardiac Enzymes No results for input(s): CKTOTAL, CKMB, CKMBINDEX, TROPONINI in the last 72 hours.  BNP: BNP (last 3 results) Recent Labs    07/26/19 1236  BNP 2,568.2*    ProBNP (last 3 results) No results for input(s):  PROBNP in the last 8760 hours.   D-Dimer No results for input(s): DDIMER in the last 72 hours. Hemoglobin A1C No results for input(s): HGBA1C in the last 72 hours. Fasting Lipid Panel No results for input(s): CHOL, HDL, LDLCALC, TRIG, CHOLHDL, LDLDIRECT in the last 72 hours. Thyroid Function Tests No results for input(s): TSH, T4TOTAL, T3FREE, THYROIDAB in the last 72 hours.  Invalid input(s): FREET3  Other results:   Imaging    No results found.   Medications:     Scheduled Medications: . amiodarone  200 mg Per Tube BID  . aspirin  81 mg Per Tube Daily  . chlorhexidine gluconate (MEDLINE KIT)  15 mL Mouth Rinse BID  . Chlorhexidine Gluconate Cloth  6 each Topical Daily  . cholestyramine  4 g Oral BID  . collagenase   Topical Daily  . feeding supplement (PROSource TF)  45 mL Per Tube Daily  . free water  300 mL Per Tube Q4H  . insulin aspart  0-24 Units Subcutaneous Q4H  . insulin detemir  10 Units Subcutaneous BID  . ipratropium  0.5 mg Nebulization TID BM  . levalbuterol  1.25 mg Nebulization TID BM  . mouth rinse  15 mL Mouth Rinse 10 times per day  . midodrine  15 mg Per Tube TID WC  . nutrition supplement (JUVEN)  1 packet Per Tube BID BM  . sodium chloride flush  3 mL Intravenous Q12H  . sodium chloride flush  5 mL Intracatheter Q8H  . thiamine injection  100 mg Intravenous Daily    Infusions: . sodium chloride Stopped (08/13/19 1631)  . bivalirudin (ANGIOMAX) infusion 0.5 mg/mL (Non-ACS indications) 0.055 mg/kg/hr (09/04/19 0700)  . feeding supplement (PIVOT 1.5 CAL) 65 mL/hr at 09/04/19 0100  . fluconazole (DIFLUCAN) IV Stopped (09/04/19 0322)  . vancomycin Stopped (09/03/19 1555)    PRN Medications: sodium chloride, acetaminophen (TYLENOL) oral liquid 160 mg/5 mL, dextrose, fentaNYL (SUBLIMAZE) injection, Gerhardt's butt cream, lip balm, metoprolol tartrate, ondansetron (ZOFRAN) IV   Assessment/Plan   1. Shock: Mixed cardiogenic/septic.  Echo with  EF 20-25%, moderate RV dysfunction.  VA ECMO post-VF arrest, cannulated 6/25.  Stable s/p return to OR for mediastinal re-exploration due to bleeding on 6/25.  Decannulated from [place] ECMO on 08/06/19. Impella 5.5 placed. Underwent emergent chest re-exploration on 6/29 for tamponade and clot removal.  Chest closed 7/2.  Impella 5.5 removed 7/9.  Milrinone stopped 7/23 NE stopped overnight. Suspect primarily septic/vasodilatory shock with fever, elevated WBCs but down trending. No central line access to check CVPs but continues w/ edema although may be secondary to hypoalbuemia (1.7). SCr stable.   -Will give 40 mg IV Lasix x 1 today.  -Continue midodrine 15 mg tid 2. 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  Prolonged VF arrest on 6/25. Chest closed 7/2.  - ASA  - Holding Crestor with elevated LFTs.  3. Cardiac arrest: VF arrest with prolonged code. Episodes of VT/NSVT in setting of low K (difficult to effectively replace). K now stable, minimal ectopy.  - Continue PO amio 200 mg bid.   - Can consider lifevest at d/c but doubt condition will permit - Keep K > 4.0 Mg > 2.0  - No change 4. Acute hypoxemic respiratory failure: In setting of cardiac arrest.  H/o COPD.  Bibasilar infiltrates, possible HCAP.  Now with tracheostomy. Diflucan with candida in BAL and blood culture.  More rhonchorous on exam.  - tracheal aspirate re cultured 7/22 growing rare candida tropicalis and albicans  - Vanc + meropenum restarted 7/23 - Chest CT 7/23 w/ concerns for ?developing empyema.  - CCM following and have discussed further CT drainage with TCTS but have deferred - Meropenum discontinued 7/26. Remains on Vanc. ID to determine duration  5. Acute blood loss anemia: Now s/p mediastinal re-exploration x 3. Hgb 7.6 today.  - Now on bivalirudin gtt. Change to Eliquis after PEG tube placement - Monitor. 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   Platelets have recovered.  7. ID: Suspect HCAP initially, on meropenem/anidulafungin/vanco initially with ongoing fevers and leukocytosis. Venous dopplers negative for DVT. Possible drug fever from meropenem, he was switched to Zosyn 7/14 - 7/21.  He had abdominal US with gallbladder sludge, no definite cholecystitis.  HIDA scan done, GB does not opacify => ?acalculous cholecystitis => now with cholecystostomy tube.  Most recently, Candida in blood culture and BAL, he is on Diflucan. Exam by ophthalmology => no candidal endophthalmitis.  Foley and PICC removed 7/21.  WBCs falling 27 -> 25 -> 19.2->17 K. Tm 100.5  - ID following.  - C.diff negative - Currently on Diflucan. Vanc + meropenum restarted 7/22. Meropenum discontinued 7/26 -  tracheal aspirate re cultured yesterday. NGTD  - Chest CT w/ concerns for ?developing empyema. CCM and TCTS following, would consider drainage.  8. Elevated bilirubin: Mostly direct = likely cholestatic/shock liver/RV failure predominantly.   Abdominal US with evidence for cirrhosis, sludge in GB but no definite acute cholecystitis. NH3 was not significantly elevated. Tbili down to 5.9. HIDA scan showed patent common bile duct, so not obstructive stone causing elevated bilirubin.  9. Ileus: Improved.  Getting TFs. Planning PEG placement today  10. Atrial fibrillation/flutter: Paroxysmal. In NSR today.     - Continue PO amio, 200 mg bid  - bivalirudin gtt. No bleeding. Transition to Eliquis post PEG tube placement  11. Neuro: CT head negative on 7/11. EEG with e/o diffuse encephalopathy.  - seems to lose eyes intermittently on command. O/w no meaningful interaction 12. Hypernatremia: Na 143 today  13. Hypokalemia - K improved today 4.4   Lyda Jester, PA-C 09/04/2019 7:33 AM  Patient seen with PA, agree with the above note.   He is currently afebrile, had low grade fever to 100.5 yesterday.  Off milrinone and  norepinephrine, getting midodrine 15 mg tid.   Weight stable.  Follows commands transiently per nurse.   General: Awake on vent Neck: No JVD, no thyromegaly or thyroid nodule.  Lungs: Crackles at bases.  CV: Nondisplaced PMI.  Heart regular S1/S2, no S3/S4, no murmur.  1+ ankle edema.   Abdomen: Soft, nontender, no hepatosplenomegaly, no distention.  Skin: Jaundice.  Neurologic: Follows commands at times.  Extremities: No clubbing or cyanosis.  HEENT: Normal.   Continue fluconazole until 2 wks after lines pulled. Now off meropenem, continues vancomycin.  If begins to spike fevers again, will need drainage of loculated effusion.   PEG tube today.  After PEG, will stop bivalirudin and start Eliquis (HIT).   Continue midodrine, MAP stable.   Give Lasix 40 mg IV x 1, aim to keep I/Os even.   CRITICAL CARE Performed by: Loralie Champagne  Total critical care time: 35 minutes  Critical care time was exclusive of separately billable procedures and treating other patients.  Critical care was necessary to treat or prevent imminent or life-threatening deterioration.  Critical care was time spent personally by me on the following activities: development of treatment plan with patient and/or surrogate as well as nursing, discussions with consultants, evaluation of patient's response to treatment, examination of patient, obtaining history from patient or surrogate, ordering and performing treatments and interventions, ordering and review of laboratory studies, ordering and review of radiographic studies, pulse oximetry and re-evaluation of patient's condition.  Loralie Champagne 09/04/2019 8:06 AM

## 2019-09-04 NOTE — Progress Notes (Signed)
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+  Labs: Recent Labs    09/02/19 0248 09/02/19 0249 09/03/19 0245 09/03/19 1311 09/04/19 0049  HGB 7.9*   < > 8.3*  --  7.6*  HCT 27.6*  --  28.9*  --  27.2*  PLT 358  --  335  --  289  APTT 57*  --  56*  --  54*  LABPROT  --   --   --   --  17.1*  INR  --   --   --   --  1.4*  CREATININE  --    < > 1.29* 1.30* 1.16   < > = values in this interval not displayed.    Assessment: 64 yo male s/p ECMO and Impella. 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 Pltc has normalized, no clots identified on dopplers 7/10.  Bivalirudin is therapeutic at 54 seconds on 0.055 mg/kg/hr.   Goals of Therapy: APTT 50-85 secs  Plan: Continue bivalirudin at 0.055 mg/kg/hr Monitor daily aPTT, CBC, and for s/sx of bleeding  Fredonia Highland, PharmD, BCPS Clinical Pharmacist [phone] Please check AMION for all [facility] Pharmacy numbers 09/04/2019

## 2019-09-04 NOTE — Progress Notes (Signed)
Subjective: No new complaints  Antibiotics:  Anti-infectives (From admission, onward)   Start     Dose/Rate Route Frequency Ordered Stop   09/03/19 1415  vancomycin (VANCOREADY) IVPB 750 mg/150 mL     Discontinue     750 mg 150 mL/hr over 60 Minutes Intravenous Every 24 hours 09/03/19 1413     08/31/19 1000  vancomycin (VANCOREADY) IVPB 1250 mg/250 mL  Status:  Discontinued        1,250 mg 166.7 mL/hr over 90 Minutes Intravenous Every 24 hours 08/30/19 1323 09/03/19 1413   08/30/19 0930  meropenem (MERREM) 1 g in sodium chloride 0.9 % 100 mL IVPB  Status:  Discontinued        1 g 200 mL/hr over 30 Minutes Intravenous Every 8 hours 08/30/19 0917 09/03/19 0927   08/30/19 0930  vancomycin (VANCOREADY) IVPB 1500 mg/300 mL        1,500 mg 150 mL/hr over 120 Minutes Intravenous  Once 08/30/19 0918 08/30/19 1152   08/25/19 0200  fluconazole (DIFLUCAN) IVPB 400 mg     Discontinue     400 mg 100 mL/hr over 120 Minutes Intravenous Every 24 hours 08/25/19 0146     08/22/19 1400  piperacillin-tazobactam (ZOSYN) IVPB 3.375 g  Status:  Discontinued        3.375 g 12.5 mL/hr over 240 Minutes Intravenous Every 8 hours 08/22/19 1032 08/29/19 1021   08/20/19 2200  vancomycin (VANCOREADY) IVPB 750 mg/150 mL  Status:  Discontinued        750 mg 150 mL/hr over 60 Minutes Intravenous Every 24 hours 08/19/19 1623 08/20/19 0758   08/20/19 0900  vancomycin (VANCOREADY) IVPB 750 mg/150 mL  Status:  Discontinued        750 mg 150 mL/hr over 60 Minutes Intravenous Every 24 hours 08/20/19 0758 08/21/19 0852   08/16/19 1400  vancomycin (VANCOCIN) IVPB 1000 mg/200 mL premix  Status:  Discontinued        1,000 mg 200 mL/hr over 60 Minutes Intravenous Every 24 hours 08/16/19 1108 08/19/19 1623   08/16/19 0745  anidulafungin ([name]) 100 mg in sodium chloride 0.9 % 100 mL IVPB  Status:  Discontinued       "Followed by" Linked Group Details   100 mg 78 mL/hr over 100 Minutes Intravenous Every 24 hours  08/15/19 0741 08/22/19 1032   08/15/19 0730  anidulafungin ([name]) 200 mg in sodium chloride 0.9 % 200 mL IVPB       "Followed by" Linked Group Details   200 mg 78 mL/hr over 200 Minutes Intravenous  Once 08/15/19 0741 08/15/19 1346   08/13/19 1400  meropenem (MERREM) 1 g in sodium chloride 0.9 % 100 mL IVPB  Status:  Discontinued        1 g 200 mL/hr over 30 Minutes Intravenous Every 8 hours 08/13/19 1236 08/22/19 1032   08/13/19 1400  vancomycin (VANCOREADY) IVPB 1500 mg/300 mL  Status:  Discontinued        1,500 mg 150 mL/hr over 120 Minutes Intravenous Every 24 hours 08/13/19 1244 08/16/19 1108   08/10/19 1504  vancomycin (VANCOCIN) 1,000 mg in sodium chloride 0.9 % 1,000 mL irrigation  Status:  Discontinued          As needed 08/10/19 1505 08/10/19 1840   08/10/19 1500  vancomycin (VANCOCIN) 1,000 mg in sodium chloride 0.9 % 1,000 mL irrigation  Status:  Discontinued         Irrigation To Surgery  08/10/19 1448 08/10/19 1453   08/10/19 1500  vancomycin (VANCOCIN) 1,000 mg in sodium chloride 0.9 % 1,000 mL irrigation  Status:  Discontinued         Irrigation To Surgery 08/10/19 1454 08/10/19 1855   08/10/19 0800  vancomycin (VANCOREADY) IVPB 1750 mg/350 mL        1,750 mg 175 mL/hr over 120 Minutes Intravenous Every 36 hours 08/09/19 1426 08/11/19 2356   08/08/19 2232  vancomycin variable dose per unstable renal function (pharmacist dosing)  Status:  Discontinued         Does not apply See admin instructions 08/08/19 2232 08/09/19 1426   08/06/19 1827  vancomycin (VANCOCIN) powder  Status:  Discontinued          As needed 08/06/19 1828 08/06/19 1936   08/06/19 1503  vancomycin (VANCOCIN) 1,000 mg in sodium chloride 0.9 % 1,000 mL irrigation  Status:  Discontinued          As needed 08/06/19 1504 08/06/19 1936   08/04/19 2100  vancomycin (VANCOCIN) IVPB 1000 mg/200 mL premix  Status:  Discontinued        1,000 mg 200 mL/hr over 60 Minutes Intravenous Every 12 hours 08/04/19 0721  08/08/19 2225   08/04/19 0730  vancomycin (VANCOREADY) IVPB 2000 mg/400 mL        2,000 mg 200 mL/hr over 120 Minutes Intravenous  Once 08/04/19 0721 08/04/19 1003   08/04/19 0730  meropenem (MERREM) 1 g in sodium chloride 0.9 % 100 mL IVPB        1 g 200 mL/hr over 30 Minutes Intravenous Every 8 hours 08/04/19 0721 08/12/19 2220   08/02/19 2030  vancomycin (VANCOCIN) IVPB 1000 mg/200 mL premix        1,000 mg 200 mL/hr over 60 Minutes Intravenous  Once 08/02/19 1516 08/02/19 2151   08/02/19 1530  cefUROXime (ZINACEF) 1.5 g in sodium chloride 0.9 % 100 mL IVPB        1.5 g 200 mL/hr over 30 Minutes Intravenous Every 12 hours 08/02/19 1516 08/04/19 0430   08/02/19 0400  vancomycin (VANCOREADY) IVPB 1250 mg/250 mL        1,250 mg 166.7 mL/hr over 90 Minutes Intravenous To Surgery 08/01/19 1422 08/02/19 0923   08/02/19 0400  cefUROXime (ZINACEF) 1.5 g in sodium chloride 0.9 % 100 mL IVPB        1.5 g 200 mL/hr over 30 Minutes Intravenous To Surgery 08/01/19 1422 08/02/19 0853   08/02/19 0400  cefUROXime (ZINACEF) 750 mg in sodium chloride 0.9 % 100 mL IVPB        750 mg 200 mL/hr over 30 Minutes Intravenous To Surgery 08/01/19 1422 08/02/19 1430   08/01/19 0400  vancomycin (VANCOREADY) IVPB 1250 mg/250 mL  Status:  Discontinued        1,250 mg 166.7 mL/hr over 90 Minutes Intravenous To Surgery 07/31/19 1034 08/01/19 1421   08/01/19 0400  cefUROXime (ZINACEF) 1.5 g in sodium chloride 0.9 % 100 mL IVPB  Status:  Discontinued        1.5 g 200 mL/hr over 30 Minutes Intravenous To Surgery 07/31/19 1034 08/01/19 1421   08/01/19 0400  cefUROXime (ZINACEF) 750 mg in sodium chloride 0.9 % 100 mL IVPB  Status:  Discontinued        750 mg 200 mL/hr over 30 Minutes Intravenous To Surgery 07/31/19 1034 08/01/19 1421      Medications: Scheduled Meds: . amiodarone  200 mg Per Tube BID  .  aspirin  81 mg Per Tube Daily  . chlorhexidine gluconate (MEDLINE KIT)  15 mL Mouth Rinse BID  .  Chlorhexidine Gluconate Cloth  6 each Topical Daily  . cholestyramine  4 g Oral BID  . collagenase   Topical Daily  . feeding supplement (PROSource TF)  45 mL Per Tube Daily  . free water  300 mL Per Tube Q4H  . insulin aspart  0-24 Units Subcutaneous Q4H  . insulin detemir  6 Units Subcutaneous BID  . ipratropium  0.5 mg Nebulization TID BM  . levalbuterol  1.25 mg Nebulization TID BM  . mouth rinse  15 mL Mouth Rinse 10 times per day  . midodrine  15 mg Per Tube TID WC  . nutrition supplement (JUVEN)  1 packet Per Tube BID BM  . sodium chloride flush  3 mL Intravenous Q12H  . sodium chloride flush  5 mL Intracatheter Q8H  . thiamine injection  100 mg Intravenous Daily   Continuous Infusions: . sodium chloride Stopped (08/13/19 1631)  . bivalirudin (ANGIOMAX) infusion 0.5 mg/mL (Non-ACS indications) 0.055 mg/kg/hr (09/04/19 0700)  . feeding supplement (PIVOT 1.5 CAL) Stopped (09/04/19 1032)  . fluconazole (DIFLUCAN) IV Stopped (09/04/19 0322)  . vancomycin Stopped (09/03/19 1555)   PRN Meds:.sodium chloride, acetaminophen (TYLENOL) oral liquid 160 mg/5 mL, dextrose, fentaNYL (SUBLIMAZE) injection, Gerhardt's butt cream, lip balm, metoprolol tartrate, ondansetron (ZOFRAN) IV    Objective: Weight change: 0.1 kg  Intake/Output Summary (Last 24 hours) at 09/04/2019 1103 Last data filed at 09/04/2019 0700 Gross per 24 hour  Intake 2150.89 ml  Output 1425 ml  Net 725.89 ml   Blood pressure (!) 111/56, pulse 72, temperature 99.8 F (37.7 C), temperature source Oral, resp. rate (!) 26, height _0  (1.753 m), weight 68.3 kg, SpO2 100 %. Temp:  [98.3 F (36.8 C)-100.5 F (38.1 C)] 99.8 F (37.7 C) (07/27 0729) Pulse Rate:  [63-83] 72 (07/27 0729) Resp:  [18-26] 26 (07/27 0729) BP: (104-129)/(49-65) 111/56 (07/27 0700) SpO2:  [97 %-100 %] 100 % (07/27 0729) FiO2 (%):  [40 %] 40 % (07/27 0729) Weight:  [68.3 kg] 68.3 kg (07/27 0400)  Physical Exam: General tracking nodding  head, following commands  Cardiovascular regular rate and rhythm no murmurs gallops rubs  Pulmonary coarse breath sounds  GI soft nondistended  Skin incisions clean biliary drain in place  peripheral IV clean    Lower back decubitus ulcer 09/03/2019:    Neuro exam no new focal abnormalities following commands today CBC:    BMET Recent Labs    09/03/19 1311 09/04/19 0049  NA 145 143  K 4.7 4.4  CL 111 111  CO2 26 22  GLUCOSE 180* 187*  BUN 73* 83*  CREATININE 1.30* 1.16  CALCIUM 8.0* 8.0*     Liver Panel  Recent Labs    09/02/19 0249 09/02/19 0249 09/02/19 1825 09/03/19 0245  PROT 5.4*  --   --  5.9*  ALBUMIN 1.6*   < > 1.6* 1.7*  AST 125*  --   --  142*  ALT 125*  --   --  137*  ALKPHOS 393*  --   --  409*  BILITOT 7.5*  --   --  8.1*   < > = values in this interval not displayed.       Sedimentation Rate No results for input(s): ESRSEDRATE in the last 72 hours. C-Reactive Protein No results for input(s): CRP in the last 72 hours.  Micro Results: Recent  Results (from the past 720 hour(s))  Culture, respiratory (non-expectorated)     Status: None   Collection Time: 08/11/19  3:32 PM   Specimen: Tracheal Aspirate; Respiratory  Result Value Ref Range Status   Specimen Description TRACHEAL ASPIRATE  Final   Special Requests Normal  Final   Gram Stain   Final    NO WBC SEEN NO SQUAMOUS EPITHELIAL CELLS SEEN RARE BUDDING YEAST SEEN Performed at [facility], [address] [place], [place] [address]    Culture FEW CANDIDA TROPICALIS  Final   Report Status 08/13/2019 FINAL  Final  Culture, blood (Routine X 2) w Reflex to ID Panel     Status: None   Collection Time: 08/13/19  8:27 AM   Specimen: BLOOD  Result Value Ref Range Status   Specimen Description BLOOD BLOOD LEFT ARM  Final   Special Requests   Final    BOTTLES DRAWN AEROBIC AND ANAEROBIC Blood Culture adequate volume   Culture   Final    NO GROWTH 5 DAYS Performed at [facility], [place] [address], [place], [place] [address]    Report Status 08/18/2019 FINAL  Final  Culture, blood (Routine X 2) w Reflex to ID Panel     Status: None   Collection Time: 08/13/19  8:31 AM   Specimen: BLOOD  Result Value Ref Range Status   Specimen Description BLOOD BLOOD LEFT HAND  Final   Special Requests   Final    BOTTLES DRAWN AEROBIC AND ANAEROBIC Blood Culture adequate volume   Culture   Final    NO GROWTH 5 DAYS Performed at [facility], [place] [address] [place], [place] [address]    Report Status 08/18/2019 FINAL  Final  Culture, respiratory (non-expectorated)     Status: None   Collection Time: 08/13/19  4:12 PM   Specimen: Tracheal Aspirate; Respiratory  Result Value Ref Range Status   Specimen Description TRACHEAL ASPIRATE  Final   Special Requests NONE  Final   Gram Stain   Final    NO WBC SEEN FEW YEAST Performed at [facility], [place] [address], [place], [place] [address]    Culture FEW CANDIDA TROPICALIS  Final   Report Status 08/16/2019 FINAL  Final  Culture, Urine     Status: None   Collection Time: 08/15/19  6:13 PM   Specimen: Urine, Catheterized  Result Value Ref Range Status   Specimen Description URINE, CATHETERIZED  Final   Special Requests NONE  Final   Culture   Final    NO GROWTH Performed at [facility], [address] [place], [place] [address]    Report Status 08/17/2019 FINAL  Final  Culture, blood (routine x 2)     Status: None   Collection Time: 08/15/19  7:53 PM   Specimen: BLOOD RIGHT HAND  Result Value Ref Range Status   Specimen Description BLOOD RIGHT HAND  Final   Special Requests   Final    BOTTLES DRAWN AEROBIC AND ANAEROBIC Blood Culture adequate volume   Culture   Final    NO GROWTH 5 DAYS Performed at [facility], [place] [address] [place], [place] [address]    Report Status 08/20/2019 FINAL  Final  Culture, blood (routine x 2)     Status: None   Collection Time: 08/15/19  7:53 PM    Specimen: BLOOD RIGHT HAND  Result Value Ref Range Status   Specimen Description  BLOOD RIGHT HAND  Final   Special Requests   Final    BOTTLES DRAWN AEROBIC AND ANAEROBIC Blood Culture adequate volume   Culture   Final    NO GROWTH 5 DAYS Performed at [facility], [address], [place], [place] [address]    Report Status 08/20/2019 FINAL  Final  Aerobic/Anaerobic Culture (surgical/deep wound)     Status: None   Collection Time: 08/17/19  2:32 PM   Specimen: Wound  Result Value Ref Range Status   Specimen Description WOUND  Final   Special Requests NONE  Final   Gram Stain NO WBC SEEN NO ORGANISMS SEEN   Final   Culture   Final    No growth aerobically or anaerobically. Performed at [facility], [place] [address], [place], [place] [address]    Report Status 08/22/2019 FINAL  Final  Culture, fungus without smear     Status: Abnormal (Preliminary result)   Collection Time: 08/20/19  2:00 PM   Specimen: Bronchoalveolar Lavage; Other  Result Value Ref Range Status   Specimen Description BRONCHIAL ALVEOLAR LAVAGE  Final   Special Requests   Final    Normal Performed at [facility], [place] [address], [place], [place] [address]    Culture CANDIDA ALBICANS (A)  Final   Report Status PENDING  Incomplete  Culture, bal-quantitative     Status: Abnormal   Collection Time: 08/20/19  2:00 PM   Specimen: Bronchoalveolar Lavage; Respiratory  Result Value Ref Range Status   Specimen Description BRONCHIAL ALVEOLAR LAVAGE  Final   Special Requests NONE  Final   Gram Stain   Final    FEW WBC PRESENT, PREDOMINANTLY PMN NO ORGANISMS SEEN Performed at [facility], [address] [place], [place] [address]    Culture (A)  Final    4,000 COLONIES/mL CANDIDA TROPICALIS 3,000 COLONIES/mL CANDIDA ALBICANS    Report Status 08/25/2019 FINAL  Final  Body fluid culture     Status: None   Collection Time: 08/22/19  9:14 AM   Specimen: Pleura; Body Fluid  Result Value Ref  Range Status   Specimen Description PLEURAL  Final   Special Requests NONE  Final   Gram Stain   Final    NO WBC SEEN NO ORGANISMS SEEN Performed at [facility], [address] [place], [place] [address]    Culture RARE CANDIDA PARAPSILOSIS  Final   Report Status 08/25/2019 FINAL  Final  Culture, blood (routine x 2)     Status: Abnormal   Collection Time: 08/22/19 12:58 PM   Specimen: BLOOD RIGHT HAND  Result Value Ref Range Status   Specimen Description BLOOD RIGHT HAND  Final   Special Requests   Final    BOTTLES DRAWN AEROBIC ONLY Blood Culture adequate volume   Culture  Setup Time   Final    AEROBIC BOTTLE ONLY YEAST CRITICAL RESULT CALLED TO, READ BACK BY AND VERIFIED WITH: Karsten [facility] 08/25/19 0123 JDW Performed at [facility], [place] [address] [place],  [address]    Culture CANDIDA PARAPSILOSIS (A)  Final   Report Status 08/26/2019 FINAL  Final  Culture, blood (routine x 2)     Status: None   Collection Time: 08/22/19 12:58 PM   Specimen: BLOOD RIGHT HAND  Result Value Ref Range Status   Specimen Description BLOOD RIGHT HAND  Final   Special Requests   Final    BOTTLES DRAWN AEROBIC  ONLY Blood Culture adequate volume   Culture   Final    NO GROWTH 5 DAYS Performed at [facility], [place] [address], [place], [place] [address]    Report Status 08/27/2019 FINAL  Final  Blood Culture ID Panel (Reflexed)     Status: Abnormal   Collection Time: 08/22/19 12:58 PM  Result Value Ref Range Status   Enterococcus species NOT DETECTED NOT DETECTED Final   Listeria monocytogenes NOT DETECTED NOT DETECTED Final   Staphylococcus species NOT DETECTED NOT DETECTED Final   Staphylococcus aureus (BCID) NOT DETECTED NOT DETECTED Final   Streptococcus species NOT DETECTED NOT DETECTED Final   Streptococcus agalactiae NOT DETECTED NOT DETECTED Final   Streptococcus pneumoniae NOT DETECTED NOT DETECTED Final   Streptococcus pyogenes NOT DETECTED NOT DETECTED  Final   Acinetobacter baumannii NOT DETECTED NOT DETECTED Final   Enterobacteriaceae species NOT DETECTED NOT DETECTED Final   Enterobacter cloacae complex NOT DETECTED NOT DETECTED Final   Escherichia coli NOT DETECTED NOT DETECTED Final   Klebsiella oxytoca NOT DETECTED NOT DETECTED Final   Klebsiella pneumoniae NOT DETECTED NOT DETECTED Final   Proteus species NOT DETECTED NOT DETECTED Final   Serratia marcescens NOT DETECTED NOT DETECTED Final   Haemophilus influenzae NOT DETECTED NOT DETECTED Final   Neisseria meningitidis NOT DETECTED NOT DETECTED Final   Pseudomonas aeruginosa NOT DETECTED NOT DETECTED Final   Candida albicans NOT DETECTED NOT DETECTED Final   Candida glabrata NOT DETECTED NOT DETECTED Final   Candida krusei NOT DETECTED NOT DETECTED Final   Candida parapsilosis DETECTED (A) NOT DETECTED Final    Comment: CRITICAL RESULT CALLED TO, READ BACK BY AND VERIFIED WITH: J LEDFORD PHARMD 08/25/19 0123 JDW    Candida tropicalis NOT DETECTED NOT DETECTED Final    Comment: Performed at [facility], [address] [place], [place] [address]  Aerobic/Anaerobic Culture (surgical/deep wound)     Status: None   Collection Time: 08/24/19  1:35 PM   Specimen: Abscess  Result Value Ref Range Status   Specimen Description ABSCESS  Final   Special Requests DRAIN  Final   Gram Stain   Final    RARE WBC PRESENT, PREDOMINANTLY PMN NO ORGANISMS SEEN    Culture   Final    No growth aerobically or anaerobically. Performed at [facility], [place] [address] [place], [place] [address]    Report Status 08/29/2019 FINAL  Final  Culture, blood (routine x 2)     Status: None   Collection Time: 08/25/19  3:53 PM   Specimen: BLOOD RIGHT HAND  Result Value Ref Range Status   Specimen Description BLOOD RIGHT HAND  Final   Special Requests   Final    BOTTLES DRAWN AEROBIC AND ANAEROBIC Blood Culture adequate volume   Culture   Final    NO GROWTH 5 DAYS Performed at [facility], [place] [address], [place], [place] [address]    Report Status 08/30/2019 FINAL  Final  Culture, blood (routine x 2)     Status: None   Collection Time: 08/25/19  3:53 PM   Specimen: BLOOD RIGHT ARM  Result Value Ref Range Status   Specimen Description BLOOD RIGHT ARM  Final   Special Requests   Final    BOTTLES DRAWN AEROBIC ONLY Blood Culture adequate volume   Culture   Final    NO GROWTH 5 DAYS Performed at [facility], [place] [address], [place], [place]  [address]    Report Status 08/30/2019 FINAL  Final  Culture, blood (routine x 2)     Status: None   Collection Time: 08/29/19  9:28 AM   Specimen: BLOOD RIGHT HAND  Result Value Ref Range Status   Specimen Description BLOOD RIGHT HAND  Final   Special Requests   Final    BOTTLES DRAWN AEROBIC ONLY Blood Culture adequate volume   Culture   Final    NO GROWTH 5 DAYS Performed at [facility], [address] [place], [place] [address]    Report Status 09/03/2019 FINAL  Final  Culture, blood (Routine X 2) w Reflex to ID Panel     Status: None   Collection Time: 08/29/19  4:00 PM   Specimen: BLOOD  Result Value Ref Range Status   Specimen Description BLOOD CENTRAL LINE  Final   Special Requests   Final    BOTTLES DRAWN AEROBIC AND ANAEROBIC Blood Culture adequate volume   Culture   Final    NO GROWTH 5 DAYS Performed at [facility], [place] [address], [place], [place] [address]    Report Status 09/03/2019 FINAL  Final  Culture, respiratory (non-expectorated)     Status: None   Collection Time: 08/30/19  8:34 AM   Specimen: Tracheal Aspirate; Respiratory  Result Value Ref Range Status   Specimen Description TRACHEAL ASPIRATE  Final   Special Requests NONE  Final   Gram Stain   Final    RARE WBC PRESENT,BOTH PMN AND MONONUCLEAR NO ORGANISMS SEEN Performed at [facility], [address] [place], [name] [address]    Culture RARE CANDIDA TROPICALIS RARE CANDIDA ALBICANS   Final    Report Status 09/02/2019 FINAL  Final  C Difficile Quick Screen (NO PCR Reflex)     Status: None   Collection Time: 09/01/19 12:26 AM   Specimen: STOOL  Result Value Ref Range Status   C Diff antigen NEGATIVE NEGATIVE Final   C Diff toxin NEGATIVE NEGATIVE Final   C Diff interpretation No C. difficile detected.  Final    Comment: Performed at [facility], [place] [address], [place], [place] [address]  MRSA PCR Screening     Status: None   Collection Time: 09/01/19  4:07 AM   Specimen: Nasal Mucosa; Nasopharyngeal  Result Value Ref Range Status   MRSA by PCR NEGATIVE NEGATIVE Final    Comment:        The GeneXpert MRSA Assay (FDA approved for NASAL specimens only), is one component of a comprehensive MRSA colonization surveillance program. It is not intended to diagnose MRSA infection nor to guide or monitor treatment for MRSA infections. Performed at [facility], [place] [address], [place], [place] [address]     Studies/Results: No results found.    Assessment/Plan:  INTERVAL HISTORY: remains afebrile  Principal Problem:   Candidemia ([place]) Active Problems:   CHF (congestive heart failure) (HCC)   Chronic pain syndrome   Elective surgery   Mixed hyperlipidemia   Moderate recurrent major depression (HCC)   Hypothyroidism   Acute systolic heart failure (HCC)   Type 2 diabetes mellitus with hyperglycemia (HCC)   AKI (acute kidney injury) ([place])   Coronary artery disease involving native coronary artery of native heart with unstable angina pectoris ([place])   Coronary artery disease   Malnutrition of moderate degree   Pressure injury of skin   Acute on chronic respiratory failure (HCC)   Acute respiratory  failure (HCC)   Hx of CABG   Acalculous cholecystitis   ARDS (adult respiratory distress syndrome) (HCC)   Cardiogenic shock (HCC)   FUO (fever of unknown origin)   Loculated pleural effusion    Todd [name] is a 64 y.o. male with  complicated  hospitalization following CABG that required [place] ECMO>>[place] support following several arrests and open chest procedures at the bedside. He has been on long term carbapenem up until recently when we switched him to zosyn and had gallbladder drain placed for rising bilirubinemia/cholangitis --> no obstructing stones on HIDA, ?Acalculous cholecystitis.   No growth from aspirated bile from perc-chole drain  He was found to have candida parapsilosis in blood  We had thought that he had defervesced on Zosyn but he had been on a cooling blanket.  When it was removed he started having fevers again they seem to have improved after initiation of vancomycin and meropenem.  I suspect it is the former antibiotic making a difference given how long he has been on broad-spectrum Carbapenem coverage   #1 candidemia:  Continue fluconazole  Once all of his central lines are out we can now give him a 14-day course of fluconazole with today being day #1  He continues to improve and "can cooperate with an eye exam he could be seen by ophthalmology again to ensure he does not have fungal endophthalmitis  #2 possible acalculous cholecystitis I doubt he has any active infection in the biliary tree at this point in time  #3  Fevers: It turns out that his fevers defervesced seeing on Zosyn likely defervesced due to the presence of a cooling blanket.  He is having more secretions today which have been cultured but so far unrevealing  CT scan that showed bilateral effusions that may be coming loculated.  I wonder if the vancomycin is covering a pathogen in his effusion.    #4 diarrhea fortunately his C. difficile testing was negative  #5  Loculated effusions: would drain this or at least sample, certainly if fevers recur For now we will plan on a 14-day course of vancomycin in total.  #6 Decubitus ulcer: examined and WOC following  I will sign off for now please call with further questions.  LOS: 40 days    Alcide Evener 09/04/2019, 11:03 AM

## 2019-09-04 NOTE — Progress Notes (Signed)
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+  Labs: Recent Labs    09/02/19 0248 09/02/19 0249 09/03/19 0245 09/03/19 1311 09/04/19 0049  HGB 7.9*   < > 8.3*  --  7.6*  HCT 27.6*  --  28.9*  --  27.2*  PLT 358  --  335  --  289  APTT 57*  --  56*  --  54*  LABPROT  --   --   --   --  17.1*  INR  --   --   --   --  1.4*  CREATININE  --    < > 1.29* 1.30* 1.16   < > = values in this interval not displayed.    Assessment: 64 yo male s/p ECMO and Impella. 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 Pltc has normalized, no clots identified on dopplers 7/10. -Bivalirudin stopped for PEG placement today. No bleeding noted.  May restart per Dr. Laneta Simmers.   Goals of Therapy: APTT 50-85 secs  Plan: Restart bivalirudin at 0.055 mg/kg/hr APTT in 4 hrs Daily aPTT, CBC, and for s/sx of bleeding  Harland German, PharmD Clinical Pharmacist **Pharmacist phone directory can now be found on [email or web address] ([email or web address]).  Listed under [facility].

## 2019-09-04 NOTE — Progress Notes (Signed)
Pt found to have NG tube hanging out about half way. The tape appeared intact, and the pt's nose appeared a bit glossy with sweat. Asked the pt if he had pulled on the tube. The pt shook his head no. Proceeded to remove the remaining length of NG tube from the pt's nose as it would have been removed in IR today following PEG placement. Ulceration visualized on the left nare, prior documentation validates it's existence. Will continue to monitor and assess.

## 2019-09-04 NOTE — Progress Notes (Signed)
Nutrition Follow-up  DOCUMENTATION CODES:   Non-severe (moderate) malnutrition in context of acute illness/injury  INTERVENTION:   Tube Feedingvia PEG post placement:  Pivot 1.5 at 65 ml/hr Prosource TF 45 mL daily Provides 157 g of protein, 2380 kcals,1094 mL of free water Meets 100% of estimated calorie and protein needs  Continue Juven BID, each packet provides 80 calories, 8 grams of carbohydrate, 2.5  grams of protein (collagen), 7 grams of L-arginine and 7 grams of L-glutamine; supplement contains CaHMB, Vitamins C, E, B12 and Zinc to promote wound healing  NUTRITION DIAGNOSIS:   Moderate Malnutrition related to acute illness as evidenced by mild fat depletion, mild muscle depletion, moderate muscle depletion.  Being addressed via TF   GOAL:   Patient will meet greater than or equal to 90% of their needs  Met  MONITOR:   Vent status, TF tolerance, Labs, Weight trends  REASON FOR ASSESSMENT:   Consult  (EMCO tube feeding recommendations)  ASSESSMENT:   Patient with PMH significant for CHF, COPD, HTN, DM, HLD, MDD, and BPH. Presents this admission with CHF exacerbation.  6/24- s/p CABG x4 6/25- VT arrest, emergent sternotomy, VA ECMO cannulation 6/28- De-cannulated, Impella placed 6/29-Bedside Mediastinal Exploration 2/2 tamponade with multiple blood clots removed 7/02 OR for sternal closure with application of wound vac, Cortrak placed but post-pyloric placement unsuccessful 7/03 TF stared at 20 ml/hr but pt with emesis, OG to LWS with 850 mL out 7/04 TPN started at 30 ml/hr 7/05 TPN remains at 30 ml/hr 7/06 TPN at 55 ml/hr, IR placed 10 fr post-pyloric feeding tube. Trickle TF initiated 7/07 TF titration, TPN discontinued 7/09 HIT positive, bivalirudin started 7/09 Impella removed 7/12 Trach placed 7/16 Cholecystostomy tube 7/27 PEG tube  Pt remains on vent support via trach, slow wean, very deconditioned  Receiving hydrotherapy to unstageable  sacral wound  TF on hold for PEG placement. Previously tolerating Pivot 1.5 at 65 ml/hr with ProSource TF daily and Juven BID via NG tube.   Free water 300 mL q 4 hours; hypernatremia improved  Current weight 68 kg; net negative 7 L since admission. Weight relatively stable recently   Labs: reviewed Meds: ss novolog, levemir, thiamine   Diet Order:   Diet Order            Diet NPO time specified  Diet effective midnight                 EDUCATION NEEDS:   Education needs have been addressed  Skin:  Skin Assessment: Skin Integrity Issues: Skin Integrity Issues:: Unstageable DTI: n/a Unstageable: Sacrum (DTI evolved to [facility] RN following Incisions: R leg, R groin Other: open chest  Last BM:  7/27 rectal tube  Height:   Ht Readings from Last 1 Encounters:  08/27/19 '5\' 9"'  (1.753 m)    Weight:   Wt Readings from Last 1 Encounters:  09/04/19 68.3 kg    BMI:  Body mass index is 22.24 kg/m.  Estimated Nutritional Needs:   Kcal:  9562-1308 kcals  Protein:  130-160 g  Fluid:  >/= 1.8 L/day  Kerman Passey MS, RDN, LDN, CNSC Registered Dietitian III Clinical Nutrition RD Pager and On-Call Pager Number Located in [place]

## 2019-09-04 NOTE — Progress Notes (Signed)
Physical Therapy Wound Evaluation and Treatment Patient Details  Name: Todd [name] MRN: [id number] Date of Birth: Jan 13, 1956  Today's Date: 09/04/2019 Time: 0913-0950 Time Calculation (min): 37 min  Subjective  Subjective: unable (vent/trach); signaling he wants something to drink Patient and Family Stated Goals: unable Date of Onset:  (unknown) Prior Treatments: foam dressing  Pain Score: Patient is on ventilator and trached, unable to state pain. Faces- 6/10 but appeared due to left shoulder pain from left sidelying  Wound Assessment  Pressure Injury 08/09/19 Sacrum Right Unstageable - Full thickness tissue loss in which the base of the injury is covered by slough (yellow, tan, gray, green or brown) and/or eschar (tan, brown or black) in the wound bed. has evolved into unstageable whe (Active)  Wound Image   09/04/19 0946  Dressing Type Gauze (Comment);Barrier Film (skin prep);Moist to moist;ABD;Tape dressing;Other (Comment) 09/04/19 0946  Dressing Changed 09/04/19 0946  Dressing Change Frequency Daily 09/04/19 0946  State of Healing Eschar 09/04/19 0946  Site / Wound Assessment Black;Brown;Pale;Yellow;Pink 09/04/19 0946  % Wound base Red or Granulating 20% 09/04/19 0946  % Wound base Yellow/Fibrinous Exudate 30% 09/04/19 0946  % Wound base Black/Eschar 50% 09/04/19 0946  % Wound base Other/Granulation Tissue (Comment) 100% 08/29/19 2000  Peri-wound Assessment Denuded;Erythema (blanchable) 09/04/19 0946  Wound Length (cm) 8.6 cm 09/04/19 0946  Wound Width (cm) 8.7 cm 09/04/19 0946  Wound Depth (cm) 0 cm 08/29/19 0942  Wound Surface Area (cm^2) 74.82 cm^2 09/04/19 0946  Wound Volume (cm^3) 0 cm^3 08/29/19 0942  Margins Unattached edges (unapproximated) 09/04/19 0946  Drainage Amount Minimal 09/04/19 0946  Drainage Description Serous 09/04/19 0946  Treatment Debridement (Selective);Hydrotherapy (Pulse lavage);Off loading;Packing (Saline gauze) 09/04/19 0946     Wound / Incision  (Open or Dehisced) 09/04/19 Buttocks Right;Medial lateral to sacral wound on rt buttock (Active)  Dressing Type ABD;Barrier Film (skin prep);Gauze (Comment);Moist to moist;Tape dressing 09/04/19 0946  Dressing Changed Changed 09/04/19 0946  Dressing Status Clean;Dry;Intact 09/04/19 0946  Dressing Change Frequency Daily 09/04/19 0946  Site / Wound Assessment Red;Yellow 09/04/19 0946  % Wound base Red or Granulating 50% 09/04/19 0946  % Wound base Yellow/Fibrinous Exudate 50% 09/04/19 0946  % Wound base Black/Eschar 0% 09/04/19 0946  Peri-wound Assessment Erythema (non-blanchable) 09/04/19 0946  Wound Length (cm) 1.1 cm 09/04/19 0946  Wound Width (cm) 0.7 cm 09/04/19 0946  Wound Depth (cm) 0.1 cm 09/04/19 0946  Wound Volume (cm^3) 0.08 cm^3 09/04/19 0946  Wound Surface Area (cm^2) 0.77 cm^2 09/04/19 0946  Margins Unattached edges (unapproximated) 09/04/19 0946  Closure None 09/04/19 0946  Drainage Amount Minimal 09/04/19 0946  Drainage Description Serous 09/04/19 0946  Non-staged Wound Description Partial thickness 09/04/19 0946  Treatment Debridement (Selective);Hydrotherapy (Pulse lavage);Packing (Saline gauze);Tape changed 09/04/19 0946      Hydrotherapy Pulsed lavage therapy - wound location: sacrum, rt buttock Pulsed Lavage with Suction (psi): 12 psi Pulsed Lavage with Suction - Normal Saline Used: 1000 mL Pulsed Lavage Tip: Tip with splash shield Selective Debridement Selective Debridement - Location: sacrum Selective Debridement - Tools Used: Scalpel Selective Debridement - Tissue Removed: scoring of black eschar   Wound Assessment and Plan  Wound Therapy - Assess/Plan/Recommendations Wound Therapy - Clinical Statement: Patient referred for hydrotherapy due to sacral pressure injury (and noted small wound on rt buttock, lateral to sacrum). His wound is non-stageable due to black eschar and unknown depth. He can benefit from hydotherapy (in conjunction with enzymatic  debridement) to remove necrotic tissue to promote wound healing.  Wound Therapy - Functional Problem List: limited positioning options due to pressure wound Factors Delaying/Impairing Wound Healing: Diabetes Mellitus;Incontinence;Immobility;Multiple medical problems Hydrotherapy Plan: Debridement;Dressing change;Patient/family education;Pulsatile lavage with suction Wound Therapy - Frequency: 6X / week Wound Therapy - Follow Up Recommendations: Other (comment) (LTACH) Wound Plan: see above  Wound Therapy Goals- Improve the function of patient's integumentary system by progressing the wound(s) through the phases of wound healing (inflammation - proliferation - remodeling) by: Decrease Necrotic Tissue to: 50% Decrease Necrotic Tissue - Progress: Goal set today Increase Granulation Tissue to: 50 Increase Granulation Tissue - Progress: Goal set today Time For Goal Achievement: 7 days Wound Therapy - Potential for Goals: Fair  Goals will be updated until maximal potential achieved or discharge criteria met.  Discharge criteria: when goals achieved, discharge from hospital, MD decision/surgical intervention, no progress towards goals, refusal/missing three consecutive treatments without notification or medical reason.  GP     Arby Barrette, PT Pager [phone]  [name] Schae Cando 09/04/2019, 11:06 AM

## 2019-09-04 NOTE — Progress Notes (Signed)
Physical Therapy Treatment Patient Details Name: Todd [name] MRN: [id number] DOB: 1955-04-28 Today's Date: 09/04/2019    History of Present Illness Todd [name] is a 64 y.o. male presenting with shortness of breath; noted AKI, CHF, recent PNA, elevated troponin, pulm edema/effusion, and cardiomegaly.Marland Kitchen CABG 6/24 and intubated (extubated 6/25)--went into cardiac arrest on same date with re-opening of chest for heart massage and direct epicardial paddles and place on ECMO. 08/03/19 washout out for tamponade. 7/2 sternal closure and wound vac. 7/9 Impella removed.08/20/19 Tracheostomy 08/24/19 cholecystostomy tube. PMHx: HFrEF, COPD, HTN, T2DM, HLD, MDD, chronic back pain, vitamin deficiency, BPH, tobacco use disorder, marijuana use disorder    PT Comments    Pt with great smile and responding to all verbalization with nodding. Pt not assisting with positioning, unaware of leaning with balance and even with multimodal cues and commands pt unable to follow commands or actively engage fully in session. Did attempt standing x 2 with max +2 assist and pt unable to extend. Will continue to follow.   PRVC 40% FIO2 with SpO2 100% HR 76-80 BP 119/59 (74)    Follow Up Recommendations  LTACH     Equipment Recommendations  None recommended by PT    Recommendations for Other Services       Precautions / Restrictions Precautions Precautions: Fall;Sternal;Other (comment) Precaution Comments: vent, flexi-seal, male pure wick, chole drain, left chest tube, sacral wound, trach    Mobility  Bed Mobility Overal bed mobility: Needs Assistance Bed Mobility: Supine to Sit;Sit to Supine;Rolling Rolling: Max assist   Supine to sit: Total assist;+2 for physical assistance     General bed mobility comments: total assist with bed egress to sit fully with bed functions to return to supine and total +2 to slide toward HOB, max assist to roll bil for pericare and linen change  Transfers Overall transfer  level: Needs assistance   Transfers: Sit to/from Stand Sit to Stand: Max assist;+2 physical assistance         General transfer comment: bil knees blocked with assist of pad and multimodal cueing to rise. Pt with buckling on LLE and max assist to rise and support trunk. Pt unable to extend hips and trunk for full standing  Ambulation/Gait             General Gait Details: unable   Stairs             Wheelchair Mobility    Modified Rankin (Stroke Patients Only)       Balance Overall balance assessment: Needs assistance   Sitting balance-Leahy Scale: Poor Sitting balance - Comments: pt with right then left lean dependent on assist for positioning and balance   Standing balance support: Bilateral upper extremity supported Standing balance-Leahy Scale: Zero                              Cognition Arousal/Alertness: Awake/alert Behavior During Therapy: WFL for tasks assessed/performed Overall Cognitive Status: Impaired/Different from baseline Area of Impairment: Following commands;Safety/judgement;Awareness;Problem solving;Orientation                         Safety/Judgement: Decreased awareness of safety;Decreased awareness of deficits   Problem Solving: Slow processing;Decreased initiation;Difficulty sequencing;Requires verbal cues;Requires tactile cues General Comments: pt replying yes nodding to all questions even incorrect name. unable to deem whether pt can hear as he replies yes to all or is also not comprehending. With gestural commands  and assist pt at times trying to assist but very inconsistent      Exercises General Exercises - Lower Extremity Short Arc Quad: PROM;Both;10 reps;Seated (pt not engaging to assist with HEP)    General Comments        Pertinent Vitals/Pain Pain Assessment: Faces Pain Score: 0-No pain    Home Living                      Prior Function            PT Goals (current goals can now  be found in the care plan section) Acute Rehab PT Goals Patient Stated Goal: pt unable to state (on vent) Time For Goal Achievement: 09/18/19 Potential to Achieve Goals: Fair Progress towards PT goals: Not progressing toward goals - comment;Goals downgraded-see care plan    Frequency    Min 2X/week      PT Plan Current plan remains appropriate    Co-evaluation              AM-PAC PT "6 Clicks" Mobility   Outcome Measure  Help needed turning from your back to your side while in a flat bed without using bedrails?: Total Help needed moving from lying on your back to sitting on the side of a flat bed without using bedrails?: Total Help needed moving to and from a bed to a chair (including a wheelchair)?: Total Help needed standing up from a chair using your arms (e.g., wheelchair or bedside chair)?: Total Help needed to walk in hospital room?: Total Help needed climbing 3-5 steps with a railing? : Total 6 Click Score: 6    End of Session   Activity Tolerance: Patient tolerated treatment well Patient left: with call bell/phone within reach;in bed Nurse Communication: Mobility status;Need for lift equipment PT Visit Diagnosis: Unsteadiness on feet (R26.81);Muscle weakness (generalized) (M62.81);Difficulty in walking, not elsewhere classified (R26.2)     Time: 6333-5456 PT Time Calculation (min) (ACUTE ONLY): 25 min  Charges:  $Therapeutic Activity: 23-37 mins                     Todd [name], PT Acute Rehabilitation Services Pager: [phone] Office: [phone]    Todd [name] 09/04/2019, 12:23 PM

## 2019-09-04 NOTE — Progress Notes (Signed)
Patient ID: ECHO ALLSBROOK, male   DOB: 13-Aug-1955, 64 y.o.   MRN: [id number] T[facility] Evening Rounds:  Hemodynamically stable  Remains on vent  Had PEG tube placed today.  Urine output ok.

## 2019-09-04 NOTE — Progress Notes (Addendum)
[facility] DAILY ICU PROGRESS NOTE                   [address]            [place] [address]          [phone]   18 Days Post-Op Procedure(s) (LRB): REMOVAL OF IMPELLA LEFT VENTRICULAR ASSIST DEVICE (N/A) TRANSESOPHAGEAL ECHOCARDIOGRAM (TEE) (N/A)  Total Length of Stay:  LOS: 40 days   Subjective: He appears a little more alert this am  Objective: Vital signs in last 24 hours: Temp:  [98.3 F (36.8 C)-100.5 F (38.1 C)] 99.8 F (37.7 C) (07/27 0729) Pulse Rate:  [63-83] 72 (07/27 0729) Cardiac Rhythm: Normal sinus rhythm (07/27 0400) Resp:  [17-26] 26 (07/27 0729) BP: (104-129)/(49-65) 111/56 (07/27 0700) SpO2:  [97 %-100 %] 100 % (07/27 0729) FiO2 (%):  [40 %] 40 % (07/27 0729) Weight:  [68.3 kg] 68.3 kg (07/27 0400)  Filed Weights   09/02/19 0458 09/03/19 0500 09/04/19 0400  Weight: 72 kg 68.2 kg 68.3 kg    Weight change: 0.1 kg   Hemodynamic parameters for last 24 hours:    Intake/Output from previous day: 07/26 0701 - 07/27 0700 In: 3672.6 [I.V.:815.2; NG/GT:2177.5; IV Piggyback:679.8] Out: 2415 [Urine:1750; Drains:145; Stool:150; Chest Tube:370]  Intake/Output this shift: No intake/output data recorded.  Current Meds: Scheduled Meds: . amiodarone  200 mg Per Tube BID  . aspirin  81 mg Per Tube Daily  . chlorhexidine gluconate (MEDLINE KIT)  15 mL Mouth Rinse BID  . Chlorhexidine Gluconate Cloth  6 each Topical Daily  . cholestyramine  4 g Oral BID  . collagenase   Topical Daily  . feeding supplement (PROSource TF)  45 mL Per Tube Daily  . free water  300 mL Per Tube Q4H  . insulin aspart  0-24 Units Subcutaneous Q4H  . insulin detemir  10 Units Subcutaneous BID  . ipratropium  0.5 mg Nebulization TID BM  . levalbuterol  1.25 mg Nebulization TID BM  . mouth rinse  15 mL Mouth Rinse 10 times per day  . midodrine  15 mg Per Tube TID WC  . nutrition supplement (JUVEN)  1 packet Per Tube BID BM  . sodium chloride flush  3 mL  Intravenous Q12H  . sodium chloride flush  5 mL Intracatheter Q8H  . thiamine injection  100 mg Intravenous Daily   Continuous Infusions: . sodium chloride Stopped (08/13/19 1631)  . bivalirudin (ANGIOMAX) infusion 0.5 mg/mL (Non-ACS indications) 0.055 mg/kg/hr (09/04/19 0700)  . feeding supplement (PIVOT 1.5 CAL) 65 mL/hr at 09/04/19 0100  . fluconazole (DIFLUCAN) IV Stopped (09/04/19 0322)  . vancomycin Stopped (09/03/19 1555)   PRN Meds:.sodium chloride, acetaminophen (TYLENOL) oral liquid 160 mg/5 mL, dextrose, fentaNYL (SUBLIMAZE) injection, [employer] [place]og[place]'s butt cream, lip balm, metoprolol tartrate, ondansetron (ZOFRAN) IV  General appearance: no distress Neurologic: appears alert this am, eyes open but not following commands Heart: RRR Lungs: Diminished left base greater than right Abdomen: Soft, non tender, sporadic bowel sounds present Extremities: Boots in place LEs Wound: Clean and dry  Lab Results: CBC: Recent Labs    09/03/19 0245 09/04/19 0049  WBC 17.2* 17.3*  HGB 8.3* 7.6*  HCT 28.9* 27.2*  PLT 335 289   BMET:  Recent Labs    09/03/19 1311 09/04/19 0049  NA 145 143  K 4.7 4.4  CL 111 111  CO2 26 22  GLUCOSE 180* 187*  BUN 73* 83*  CREATININE 1.30* 1.16  CALCIUM 8.0* 8.0*    CMET: Lab Results  Component Value Date   WBC 17.3 (H) 09/04/2019   HGB 7.6 (L) 09/04/2019   HCT 27.2 (L) 09/04/2019   PLT 289 09/04/2019   GLUCOSE 187 (H) 09/04/2019   CHOL 203 (H) 07/27/2019   TRIG 213 (H) 08/27/2019   HDL 34 (L) 07/27/2019   LDLCALC 148 (H) 07/27/2019   ALT 137 (H) 09/03/2019   AST 142 (H) 09/03/2019   NA 143 09/04/2019   K 4.4 09/04/2019   CL 111 09/04/2019   CREATININE 1.16 09/04/2019   BUN 83 (H) 09/04/2019   CO2 22 09/04/2019   TSH 2.243 07/31/2019   INR 1.4 (H) 09/04/2019   HGBA1C 9.0 (H) 07/26/2019    PT/INR:  Recent Labs    09/04/19 0049  LABPROT 17.1*  INR 1.4*   Radiology: No results found.   Assessment/Plan: S/P Procedure(s)  (LRB): REMOVAL OF IMPELLA LEFT VENTRICULAR ASSIST DEVICE (N/A) TRANSESOPHAGEAL ECHOCARDIOGRAM (TEE) (N/A) 1. CV-Previous paroxysmal a fib/flutterSR. 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 Pulmonary-VDRF, history of COPD. S/p trach and on vent via trach collar. Chest tube Continue inhalers.  3. GI-tolerating tube feedings. IR to place gastrostomy tube. Has cholecystostomy tube  4. ID- on Meropenem and Vancomycin for possible empyema. 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 WBC this am slightly decreased to 17,300 5. Volume overload-on Lasix 80 mg IV bid 6. Post op blood loss anemia-H and H this am 7.6 and 27.2 7. CBGs 128/92/86. On Insulin but will decrease to avoid further hypoglycemia. 8. Elevated transaminases, bili likely secondary to shock. Check CMP in am   Donielle Liston Alba PA-C 09/04/2019 7:42 AM   Bili level have come down from 19 now about 8 remains on vent  Trid to contact [facility] about ltac- no Market researcher available- told they would call back  I have seen and examined [name] and agree with the above assessment  and plan.  Grace Isaac MD Beeper [phone] Office [phone] 09/04/2019 2:40 PM

## 2019-09-04 NOTE — Care Management (Signed)
Case management called and spoke with [facility] with Cardiology and left phone contact to have Doree Fudge, PA or Dr. Dorris Fetch call to have a peer to peer with [facility] by tomorrow at 10 am - for possible LTAC placement for [facility] - number to contact is [phone] - option 5.

## 2019-09-05 ENCOUNTER — Inpatient Hospital Stay (HOSPITAL_COMMUNITY): Payer: Medicare Other

## 2019-09-05 DIAGNOSIS — I509 Heart failure, unspecified: Secondary | ICD-10-CM | POA: Diagnosis not present

## 2019-09-05 LAB — GLUCOSE, CAPILLARY
Glucose-Capillary: 102 mg/dL — ABNORMAL HIGH (ref 70–99)
Glucose-Capillary: 112 mg/dL — ABNORMAL HIGH (ref 70–99)
Glucose-Capillary: 166 mg/dL — ABNORMAL HIGH (ref 70–99)
Glucose-Capillary: 202 mg/dL — ABNORMAL HIGH (ref 70–99)
Glucose-Capillary: 206 mg/dL — ABNORMAL HIGH (ref 70–99)
Glucose-Capillary: 211 mg/dL — ABNORMAL HIGH (ref 70–99)
Glucose-Capillary: 90 mg/dL (ref 70–99)

## 2019-09-05 LAB — MAGNESIUM: Magnesium: 2.4 mg/dL (ref 1.7–2.4)

## 2019-09-05 LAB — COMPREHENSIVE METABOLIC PANEL
ALT: 121 U/L — ABNORMAL HIGH (ref 0–44)
AST: 121 U/L — ABNORMAL HIGH (ref 15–41)
Albumin: 1.6 g/dL — ABNORMAL LOW (ref 3.5–5.0)
Alkaline Phosphatase: 347 U/L — ABNORMAL HIGH (ref 38–126)
Anion gap: 10 (ref 5–15)
BUN: 79 mg/dL — ABNORMAL HIGH (ref 8–23)
CO2: 25 mmol/L (ref 22–32)
Calcium: 8.2 mg/dL — ABNORMAL LOW (ref 8.9–10.3)
Chloride: 111 mmol/L (ref 98–111)
Creatinine, Ser: 1.12 mg/dL (ref 0.61–1.24)
GFR calc Af Amer: >60 mL/min (ref >60)
GFR calc non Af Amer: >60 mL/min (ref >60)
Glucose, Bld: 212 mg/dL — ABNORMAL HIGH (ref 70–99)
Potassium: 3.9 mmol/L (ref 3.5–5.1)
Sodium: 146 mmol/L — ABNORMAL HIGH (ref 135–145)
Total Bilirubin: 7.4 mg/dL — ABNORMAL HIGH (ref 0.3–1.2)
Total Protein: 5.8 g/dL — ABNORMAL LOW (ref 6.5–8.1)

## 2019-09-05 LAB — CBC
HCT: 28 % — ABNORMAL LOW (ref 39.0–52.0)
Hemoglobin: 7.8 g/dL — ABNORMAL LOW (ref 13.0–17.0)
MCH: 30.4 pg (ref 26.0–34.0)
MCHC: 27.9 g/dL — ABNORMAL LOW (ref 30.0–36.0)
MCV: 108.9 fL — ABNORMAL HIGH (ref 80.0–100.0)
Platelets: 300 10*3/uL (ref 150–400)
RBC: 2.57 MIL/uL — ABNORMAL LOW (ref 4.22–5.81)
RDW: 23.1 % — ABNORMAL HIGH (ref 11.5–15.5)
WBC: 14.8 10*3/uL — ABNORMAL HIGH (ref 4.0–10.5)
nRBC: 0 % (ref 0.0–0.2)

## 2019-09-05 LAB — APTT: aPTT: 66 seconds — ABNORMAL HIGH (ref 24–36)

## 2019-09-05 MED ORDER — INSULIN DETEMIR 100 UNIT/ML ~~LOC~~ SOLN
8.0000 [IU] | Freq: Two times a day (BID) | SUBCUTANEOUS | Status: DC
  Administered 2019-09-05 – 2019-09-09 (×10): 8 [IU] via SUBCUTANEOUS
  Filled 2019-09-05 (×12): qty 0.08

## 2019-09-05 MED ORDER — AMIODARONE HCL 200 MG PO TABS
200.0000 mg | ORAL_TABLET | Freq: Every day | ORAL | Status: DC
  Administered 2019-09-05 – 2019-10-07 (×33): 200 mg
  Filled 2019-09-05 (×33): qty 1

## 2019-09-05 MED ORDER — JUVEN PO PACK
1.0000 | PACK | Freq: Two times a day (BID) | ORAL | Status: DC
  Administered 2019-09-05 – 2019-10-09 (×68): 1
  Filled 2019-09-05 (×64): qty 1

## 2019-09-05 MED ORDER — APIXABAN 5 MG PO TABS
5.0000 mg | ORAL_TABLET | Freq: Two times a day (BID) | ORAL | Status: DC
  Administered 2019-09-05 – 2019-09-17 (×25): 5 mg
  Filled 2019-09-05 (×25): qty 1

## 2019-09-05 MED ORDER — POTASSIUM CHLORIDE 20 MEQ/15ML (10%) PO SOLN
20.0000 meq | Freq: Once | ORAL | Status: AC
  Administered 2019-09-05: 20 meq
  Filled 2019-09-05: qty 15

## 2019-09-05 MED ORDER — POTASSIUM CHLORIDE 20 MEQ/15ML (10%) PO SOLN: 20.0000 meq | Freq: Once | ORAL | Status: DC

## 2019-09-05 MED ORDER — FUROSEMIDE 40 MG PO TABS
40.0000 mg | ORAL_TABLET | Freq: Every day | ORAL | Status: DC
  Administered 2019-09-05 – 2019-09-06 (×2): 40 mg
  Filled 2019-09-05 (×2): qty 1

## 2019-09-05 MED FILL — Sodium Chloride IV Soln 0.9%: INTRAVENOUS | Qty: 100 | Status: AC

## 2019-09-05 MED FILL — Vasopressin IV Soln 20 Unit/ML (For IV Infusion): INTRAVENOUS | Qty: 1 | Status: AC

## 2019-09-05 NOTE — Progress Notes (Addendum)
Patient ID: Todd [name], male   DOB: 08-02-1955, 64 y.o.   MRN: [id number]     Advanced Heart Failure Rounding Note  PCP-Cardiologist: No primary care provider on file.   Subjective:    08/02/19 CABG 08/03/19 VF arrest. Chest open at bedside 08/03/19 Back to OR for ECMO cannulation 08/03/19 Washout out for tamponade 08/04/19 Repeat bedside washout for tamponade 08/06/19 To OR for decannulation and Impella 5.5 08/07/19 Underwent emergent bedside chest exploration at bedside for tamponade  08/10/19 Chest closed 08/16/19 HIT positive, bivalirudin begun 08/17/19 Impella removed 08/20/19 Tracheostomy 08/23/19 HIDA scan showed patent common bile duct (unable to visualize GB, unable to rule out acalculous cholecystitis).  08/24/19 cholecystostomy tube 08/30/19 chest CT suggestive of possible empyema 08/31/19 ECG with diffuse encephalopathy.  Remains on vent through TC. Awake and following commands.   Has remained febrile past 24 hrs. WBC trending down, 17>>14.   Continues w/ bilateral LEE but suspect hypoalbuminemia contributing, Albumin 1.6.   Got PEG tube yesterday.     ABx/antifungals: Meropenem and [name] stopped 7/14  Zosyn stopped 7/21.   Meropenem restarted 7/22 Candida from BAL and blood culture, started on fluconazole.   Now on fluconazole and vanc. Meropenem discontinued 7/26    Objective:   Weight Range: 68.5 kg Body mass index is 22.3 kg/m.   Vital Signs:   Temp:  [97.7 F (36.5 C)-97.8 F (36.6 C)] 97.7 F (36.5 C) (07/28 0400) Pulse Rate:  [55-84] 76 (07/28 0700) Resp:  [16-31] 21 (07/28 0700) BP: (98-136)/(41-88) 117/46 (07/28 0700) SpO2:  [100 %] 100 % (07/28 0700) FiO2 (%):  [40 %] 40 % (07/28 0400) Weight:  [68.5 kg] 68.5 kg (07/28 0500) Last BM Date: 09/04/19  Weight change: Filed Weights   09/03/19 0500 09/04/19 0400 09/05/19 0500  Weight: 68.2 kg 68.3 kg 68.5 kg    Intake/Output:   Intake/Output Summary (Last 24 hours) at 09/05/2019 0734 Last data  filed at 09/05/2019 0700 Gross per 24 hour  Intake 1177.69 ml  Output 3540 ml  Net -2362.31 ml      Physical Exam    General:  ill appearing. Jaundiced. Awake on vent through trach, follows commands  HEENT: normal, jaundiced, + scleral icterus  Neck: supple. + TC  Cor: PMI nondisplaced. Regular rate & rhythm. No rubs, gallops or murmurs. Sternal incision site ok + CTs  Lungs: coarse bilaterally  Abdomen: soft, nontender, nondistended. No hepatosplenomegaly. No bruits or masses. Good bowel sounds. + biliary drain + PEG tube  Extremities: no cyanosis, clubbing, rash, 2+ edema in thighs Neuro: Awake. Affect flat, follows commands    Telemetry   NSR 70. Brief run of NSVT Personally reviewed   Labs    CBC Recent Labs    09/03/19 0245 09/03/19 0245 09/04/19 0049 09/05/19 0040  WBC 17.2*   < > 17.3* 14.8*  NEUTROABS 14.3*  --  14.1*  --   HGB 8.3*   < > 7.6* 7.8*  HCT 28.9*   < > 27.2* 28.0*  MCV 107.8*   < > 108.8* 108.9*  PLT 335   < > 289 300   < > = values in this interval not displayed.   Basic Metabolic Panel Recent Labs    09/02/19 1825 09/03/19 0245 09/04/19 0049 09/05/19 0040  NA 146*   < > 143 146*  K 4.0   < > 4.4 3.9  CL 115*   < > 111 111  CO2 23   < > 22 25  GLUCOSE 104*   < > 187* 212*  BUN 84*   < > 83* 79*  CREATININE 1.21   < > 1.16 1.12  CALCIUM 7.9*   < > 8.0* 8.2*  MG  --   --   --  2.4  PHOS 4.3  --   --   --    < > = values in this interval not displayed.   Liver Function Tests Recent Labs    09/03/19 0245 09/05/19 0040  AST 142* 121*  ALT 137* 121*  ALKPHOS 409* 347*  BILITOT 8.1* 7.4*  PROT 5.9* 5.8*  ALBUMIN 1.7* 1.6*   No results for input(s): LIPASE, AMYLASE in the last 72 hours. Cardiac Enzymes No results for input(s): CKTOTAL, CKMB, CKMBINDEX, TROPONINI in the last 72 hours.  BNP: BNP (last 3 results) Recent Labs    07/26/19 1236  BNP 2,568.2*    ProBNP (last 3 results) No results for input(s): PROBNP in the  last 8760 hours.   D-Dimer No results for input(s): DDIMER in the last 72 hours. Hemoglobin A1C No results for input(s): HGBA1C in the last 72 hours. Fasting Lipid Panel No results for input(s): CHOL, HDL, LDLCALC, TRIG, CHOLHDL, LDLDIRECT in the last 72 hours. Thyroid Function Tests No results for input(s): TSH, T4TOTAL, T3FREE, THYROIDAB in the last 72 hours.  Invalid input(s): FREET3  Other results:   Imaging    No results found.   Medications:     Scheduled Medications:  amiodarone  200 mg Per Tube BID   aspirin  81 mg Per Tube Daily   chlorhexidine gluconate (MEDLINE KIT)  15 mL Mouth Rinse BID   Chlorhexidine Gluconate Cloth  6 each Topical Daily   cholestyramine  4 g Oral BID   collagenase   Topical Daily   feeding supplement (PROSource TF)  45 mL Per Tube Daily   free water  300 mL Per Tube Q4H   insulin aspart  0-24 Units Subcutaneous Q4H   insulin detemir  6 Units Subcutaneous BID   ipratropium  0.5 mg Nebulization TID BM   levalbuterol  1.25 mg Nebulization TID BM   mouth rinse  15 mL Mouth Rinse 10 times per day   midodrine  15 mg Per Tube TID WC   nutrition supplement (JUVEN)  1 packet Per Tube BID BM   pantoprazole sodium  40 mg Per Tube Daily   sodium chloride flush  3 mL Intravenous Q12H   sodium chloride flush  5 mL Intracatheter Q8H   thiamine injection  100 mg Intravenous Daily    Infusions:  sodium chloride Stopped (08/13/19 1631)   bivalirudin (ANGIOMAX) infusion 0.5 mg/mL (Non-ACS indications) 0.055 mg/kg/hr (09/05/19 0700)   feeding supplement (PIVOT 1.5 CAL) 65 mL/hr at 09/05/19 0700   fluconazole (DIFLUCAN) IV Stopped (09/05/19 0448)   vancomycin 750 mg (09/04/19 1918)    PRN Medications: sodium chloride, acetaminophen (TYLENOL) oral liquid 160 mg/5 mL, dextrose, fentaNYL (SUBLIMAZE) injection, Gerhardt's butt cream, lip balm, metoprolol tartrate, ondansetron (ZOFRAN) IV   Assessment/Plan   1. Shock: Mixed  cardiogenic/septic.  Echo with EF 20-25%, moderate RV dysfunction.  VA ECMO post-VF arrest, cannulated 6/25.  Stable s/p return to OR for mediastinal re-exploration due to bleeding on 6/25.  Decannulated from [place] ECMO on 08/06/19. Impella 5.5 placed. Underwent emergent chest re-exploration on 6/29 for tamponade and clot removal. Chest closed 7/2.  Impella 5.5 removed 7/9.  Milrinone stopped 7/23 NE stopped overnight. Suspect primarily septic/vasodilatory shock with fever, elevated  WBCs but down trending. No central line access to check CVPs but continues w/ edema although may be secondary to hypoalbuemia (1.6). SCr stable.   -Transition to PO Lasix 40 mg once daily. Monitor daily wts  -Continue midodrine 15 mg tid 2. 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  Prolonged VF arrest on 6/25. Chest closed 7/2.  - ASA  - Holding Crestor with elevated LFTs.  3. Cardiac arrest: VF arrest with prolonged code. Episodes of VT/NSVT in setting of low K (difficult to effectively replace). K now stable, minimal ectopy.  - Continue PO amio 200 mg bid.   - Can consider lifevest at d/c but doubt condition will permit - Keep K > 4.0 Mg > 2.0  - No change 4. Acute hypoxemic respiratory failure: In setting of cardiac arrest.  H/o COPD.  Bibasilar infiltrates, possible HCAP.  Now with tracheostomy. Diflucan with candida in BAL and blood culture.  More rhonchorous on exam.  - tracheal aspirate re cultured 7/22 growing rare candida tropicalis and albicans  - Vanc + meropenum restarted 7/23 - Chest CT 7/23 w/ concerns for ?developing empyema.  - CCM following and have discussed further CT drainage with TCTS but have deferred - Meropenum discontinued 7/26. Remains on Vanc. ID to determine duration  5. Acute blood loss anemia: Now s/p mediastinal re-exploration x 3. Hgb 7.8 today.  - Stop bivalirudin gtt and change to Eliquis today 5 mg bid (now has PEG tube) - Monitor H/H. 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  Platelets have recovered. Stop bivalirudin today per above.  7. ID: Suspect HCAP initially, on meropenem/anidulafungin/vanco initially with ongoing fevers and leukocytosis. Venous dopplers negative for DVT. Possible drug fever from meropenem, he was switched to Zosyn 7/14 - 7/21.  He had abdominal US with gallbladder sludge, no definite cholecystitis.  HIDA scan done, GB does not opacify => ?acalculous cholecystitis => now with cholecystostomy tube.  Most recently, Candida in blood culture and BAL, he is on Diflucan. Exam by ophthalmology => no candidal endophthalmitis.  Foley and PICC removed 7/21.  WBCs falling 27 -> 25 -> 19.2->17->14 K. Afebrile past 24 hrs - ID following.  - C.diff negative - Currently on Diflucan. Vanc + meropenum restarted 7/22. Meropenum discontinued 7/26 -  tracheal aspirate re cultured yesterday. NGTD  - Chest CT w/ concerns for ?developing empyema. CCM and TCTS following, would consider drainage.  8. Elevated bilirubin: Mostly direct = likely cholestatic/shock liver/RV failure predominantly.   Abdominal US with evidence for cirrhosis, sludge in GB but no definite acute cholecystitis. NH3 was not significantly elevated. Tbili 7.4. HIDA scan showed patent common bile duct, so not obstructive stone causing elevated bilirubin.  9. Ileus: Improved.  Getting TFs. S/p PEG tube placement 7/28 10. Atrial fibrillation/flutter: Paroxysmal. In NSR today.     - Continue PO amio, 200 mg bid  - Stop bivalirudin gtt. Transition to Eliquis 5 mg bid 11. Neuro: CT head negative on 7/11. EEG with e/o diffuse encephalopathy.  - seems to be improving. Following commands  12. Hypernatremia: Na 146 today  - Continue free water boluses  13. Hypokalemia - K improved today 3.9  Lyda Jester, PA-C 09/05/2019 7:34 AM  Patient seen with PA, agree with the above note.   He is currently afebrile.Off milrinone and  norepinephrine, getting midodrine 15 mg tid.  Weight stable.  Follows commands, more responsive.   General: NAD Neck: No JVD,  no thyromegaly or thyroid nodule.  Lungs: Decreased at bases.  CV: Nondisplaced PMI.  Heart regular S1/S2, no S3/S4, no murmur.  1+ LE edema.   Abdomen: Soft, nontender, no hepatosplenomegaly, no distention.  Skin: Intact without lesions or rashes.  Neurologic: Awake, follows commands.  Extremities: No clubbing or cyanosis.  HEENT: Normal.   Continue fluconazole until 2 wks from 7/27, continue vancomycin 2 wks.  If begins to spike fevers again, will need drainage of loculated pleural effusion.   Now with PEG tube.  Can stop bivalirudin and start Eliquis 5 mg bid.   Continue midodrine, MAP stable.   Start Lasix 40 mg daily to keep I/Os even.   Rhythm stable, can decrease amiodarone to 200 mg daily.   CRITICAL CARE Performed by: Loralie Champagne  Total critical care time: 35 minutes  Critical care time was exclusive of separately billable procedures and treating other patients.  Critical care was necessary to treat or prevent imminent or life-threatening deterioration.  Critical care was time spent personally by me on the following activities: development of treatment plan with patient and/or surrogate as well as nursing, discussions with consultants, evaluation of patient's response to treatment, examination of patient, obtaining history from patient or surrogate, ordering and performing treatments and interventions, ordering and review of laboratory studies, ordering and review of radiographic studies, pulse oximetry and re-evaluation of patient's condition.  Loralie Champagne 09/05/2019 7:57 AM

## 2019-09-05 NOTE — Progress Notes (Signed)
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+  Assessment: 64 yo male s/p ECMO and Impella. 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 Pltc has normalized, no clots identified on dopplers 7/10. -Bivalirudin stopped for PEG placement today. No bleeding noted.  May restart per Dr. Laneta Simmers.  APTT after restart 66 sec  Goals of Therapy: APTT 50-85 secs  Plan: Cont bivalirudin at 0.055 mg/kg/hr Daily aPTT, CBC, and for s/sx of bleeding  Thanks for allowing pharmacy to be a part of this patient's care.  Talbert Cage, PharmD Clinical Pharmacist

## 2019-09-05 NOTE — Progress Notes (Signed)
EVENING ROUNDS NOTE :     [address]       [place] [id number]             [phone]                 19 Days Post-Op Procedure(s) (LRB): REMOVAL OF IMPELLA LEFT VENTRICULAR ASSIST DEVICE (N/A) TRANSESOPHAGEAL ECHOCARDIOGRAM (TEE) (N/A)   Total Length of Stay:  LOS: 41 days  Events:   No events On vent Tolerating tubefeeds     BP (!) 119/52   Pulse 81   Temp 98.4 F (36.9 C)   Resp (!) 26   Ht 5\' 9"  (1.753 m)   Wt 68.5 kg   SpO2 100%   BMI 22.30 kg/m      Vent Mode: PRVC FiO2 (%):  [40 %] 40 % Set Rate:  [20 bmp] 20 bmp Vt Set:  [420 mL] 420 mL PEEP:  [5 cmH20] 5 cmH20 Pressure Support:  [15 cmH20] 15 cmH20 Plateau Pressure:  [11 cmH20-25 cmH20] 25 cmH20  . sodium chloride Stopped (08/13/19 1631)  . feeding supplement (PIVOT 1.5 CAL) 65 mL/hr at 09/05/19 0700  . fluconazole (DIFLUCAN) IV Stopped (09/05/19 0448)  . vancomycin 750 mg (09/05/19 1419)    I/O last 3 completed shifts: In: 1797.4 [I.V.:192.4; NG/GT:1105; IV Piggyback:500] Out: 4485 [Urine:2800; Drains:280; Stool:350; Chest Tube:1055]   CBC Latest Ref Rng & Units 09/05/2019 09/04/2019 09/03/2019  WBC 4.0 - 10.5 K/uL 14.8(H) 17.3(H) 17.2(H)  Hemoglobin 13.0 - 17.0 g/dL 7.8(L) 7.6(L) 8.3(L)  Hematocrit 39 - 52 % 28.0(L) 27.2(L) 28.9(L)  Platelets 150 - 400 K/uL 300 289 335    BMP Latest Ref Rng & Units 09/05/2019 09/04/2019 09/03/2019  Glucose 70 - 99 mg/dL 09/05/2019) 413(K) 440(N)  BUN 8 - 23 mg/dL 027(O) 53(G) 64(Q)  Creatinine 0.61 - 1.24 mg/dL 03(K 7.42 5.95)  Sodium 135 - 145 mmol/L 146(H) 143 145  Potassium 3.5 - 5.1 mmol/L 3.9 4.4 4.7  Chloride 98 - 111 mmol/L 111 111 111  CO2 22 - 32 mmol/L 25 22 26   Calcium 8.9 - 10.3 mg/dL 8.2(L) 8.0(L) 8.0(L)    ABG    Component Value Date/Time   PHART 7.416 08/29/2019 1440   PCO2ART 40.4 08/29/2019 1440   PO2ART 132 (H) 08/29/2019 1440   HCO3 25.7 08/29/2019 1440   TCO2 27 08/29/2019 1440   ACIDBASEDEF 1.0 08/08/2019 1115    O2SAT 67.1 09/02/2019 0234       08/10/2019, MD 09/05/2019 4:18 PM

## 2019-09-05 NOTE — Progress Notes (Signed)
Physical Therapy Wound Treatment Patient Details  Name: Todd [name] MRN: [id number] Date of Birth: [birth year]  Today's Date: 09/05/2019 Time: 1230-1250 Time Calculation (min): 20 min  Subjective  Subjective: unable (vent/trach) Patient and Family Stated Goals: unable Date of Onset:  (unknown) Prior Treatments: foam dressing  Pain Score:  2/10  Wound Assessment  Pressure Injury 08/09/19 Sacrum Right Unstageable - Full thickness tissue loss in which the base of the injury is covered by slough (yellow, tan, gray, green or brown) and/or eschar (tan, brown or black) in the wound bed. has evolved into unstageable whe (Active)  Dressing Type ABD;Barrier Film (skin prep);Gauze (Comment);Moist to moist 09/05/19 1452  Dressing Changed;Clean;Dry;Intact 09/05/19 1452  Dressing Change Frequency Daily 09/05/19 1452  State of Healing Eschar 09/05/19 1452  Site / Wound Assessment Black;Brown;Pale;Yellow;Pink 09/05/19 1452  % Wound base Red or Granulating 20% 09/05/19 1452  % Wound base Yellow/Fibrinous Exudate 30% 09/05/19 1452  % Wound base Black/Eschar 50% 09/05/19 1452  % Wound base Other/Granulation Tissue (Comment) 0% 09/05/19 1452  Peri-wound Assessment Denuded;Erythema (blanchable) 09/05/19 1452  Wound Length (cm) 8.6 cm 09/04/19 0946  Wound Width (cm) 8.7 cm 09/04/19 0946  Wound Depth (cm) 0 cm 08/29/19 0942  Wound Surface Area (cm^2) 74.82 cm^2 09/04/19 0946  Wound Volume (cm^3) 0 cm^3 08/29/19 0942  Margins Unattached edges (unapproximated) 09/05/19 1452  Drainage Amount Minimal 09/05/19 1452  Drainage Description Serous 09/05/19 1452  Treatment Off loading 09/04/19 2000   Santyl applied to wound bed prior to applying dressing.  Hydrotherapy Pulsed lavage therapy - wound location: sacrum, rt buttock Pulsed Lavage with Suction (psi): 12 psi Pulsed Lavage with Suction - Normal Saline Used: 1000 mL Pulsed Lavage Tip: Tip with splash shield Selective Debridement Selective  Debridement - Location: sacrum Selective Debridement - Tools Used: Scalpel Selective Debridement - Tissue Removed: scoring of black eschar   Wound Assessment and Plan  Wound Therapy - Assess/Plan/Recommendations Wound Therapy - Clinical Statement: Eschar remains adherent and cross hatched and applied Santyl to promote debridement. [name] from hydotherapy (in conjunction with enzymatic debridement) to remove necrotic tissue to promote wound healing.  Wound Therapy - Functional Problem List: limited positioning options due to pressure wound Factors Delaying/Impairing Wound Healing: Diabetes Mellitus;Incontinence;Immobility;Multiple medical problems Hydrotherapy Plan: Debridement;Dressing change;Patient/family education;Pulsatile lavage with suction Wound Therapy - Frequency: 6X / week Wound Therapy - Follow Up Recommendations: Other (comment) (LTACH) Wound Plan: see above  Wound Therapy Goals- Improve the function of patient's integumentary system by progressing the wound(s) through the phases of wound healing (inflammation - proliferation - remodeling) by: Decrease Necrotic Tissue to: 50% Decrease Necrotic Tissue - Progress: Progressing toward goal Increase Granulation Tissue to: 50 Increase Granulation Tissue - Progress: Progressing toward goal Time For Goal Achievement: 7 days Wound Therapy - Potential for Goals: Fair  Goals will be updated until maximal potential achieved or discharge criteria met.  Discharge criteria: when goals achieved, discharge from hospital, MD decision/surgical intervention, no progress towards goals, refusal/missing three consecutive treatments without notification or medical reason.  GP    Wyona Almas, PT, DPT Acute Rehabilitation Services Pager [phone] Office [phone]      Deno Etienne 09/05/2019, 2:56 PM

## 2019-09-05 NOTE — Progress Notes (Signed)
Pt alert and interactive this morning; attempting to mouth requests and responses. Appears in good spirits; denies pain. Angiomax off Wife called and updated; all questions answered. Of note, I confirmed with charge RN that the wife's personal caregiver does not count as a visitor, and that the pt's brother can continue to visit as the second designated visitor.  Hydrotherapy tolerated well after premedication  Pt alert this afternoon with c/o mild HA; cold rag applied to forehead with fan  Pt much more interactive, mouthing requests for something to drink and ice. Appeared to follow along with most of family's conversation. Beginning to gain bed mobility, mostly with upper extremities.

## 2019-09-05 NOTE — Progress Notes (Signed)
NAME:  Todd [name], MRN:  [id number], DOB:  Oct 30, 1955, LOS: 49 ADMISSION DATE:  07/26/2019, CONSULTATION DATE:  08/03/19 REFERRING MD:  Roxan Hockey, CHIEF COMPLAINT:  ECMO   Brief History   64 yo male admitted with CHF exacerbation, found to have multi-vessel disease requiring CABG 6/24, post op VT Arrest 6/25 and cannulated for VA ECMO.    History of present illness   Presented with worsening dyspnea 6/17 c/w CHF exacerbation.  LHC  with severe triple vessel disease.  Underwent CABG 6/24.  Vtach arrest 6/25.  Chest opened bedside and cardiac massage initiated as well as multiple cardioversions, amiodarone, bicarb etc.  Brought to OR and cannulated for VA ECMO.  PCCM consulted to assist with management  Comorbidities include DM, heavy smoking, COPD  Past Medical History  Depression Ischemic cardiomyopathy HTN HLD  Significant Hospital Events   6/24 CABG 6/25 VA cannulation 6/28 decannulated and impella placed 6/29 bedside re-exploration; s/p decannulation. Placement of impella device originally at p8 but decreased to p4 2/2 suction events overnight up to p6. Echo completed and repositioned device. Remained on considerable support with 8 epi and 46 norepi vaso 0.05. continued chest tube output with large clots noted. Heparin thru device. Replacement products ongoing. 60% 8 peep 6/30 bedside mediastinal re-exploration and clean out of hematoma with tamponade and worsening hemodynamics. Pt had large volume transfusion. rebolused with amio 2/2 nsvt episode.  Weight up 48 pounds.  Started diuresis, Lasix drip started. 7/01 iatrogenic respiratory alkalosis, vent rate decreased. 7/02 No significant issues overnight remains on pressors and Impella not tolerating tube feeds with high gastric output awaiting core track placement 7/03 started trickle TF  7/05 Swab removed, CVL and PICC placed. 7/13 Trach and BAL 7/16 Awake this AM and tracking. No follow commands. Still having fevers. Successful  ultrasound and CT guided placement of a 10.2 French cholecystostomy tube. A small amount of aspirated bile was capped and sent to the laboratory for analysis 7/17 Off sedation gtt. doing pressure support ventilation.  Beginning to follow some commands occasionally intermittently.  Very jaundiced.  Growing Candida in the blood and BAL.  Still febrile but T-max is coming down.  WBC persistently elevated; currently at 19.4 okay.  Diflucan started. Currently on milrinone GTT, amiodarone gtt., vasopressin gtt., Levophed gtt. and bivalrudin gtt. Off epi gtt. Cards planning lasix x 1 today. 7/21 Ongoing fevers, WBC increased 7/22: Persistent fevers, pan cultured with CT Chest/Head. Suspected source tracheobronchitis.   Consults:  Advanced heart failure, PCCM, TCTS  Procedures:  LUE PICC 7/5 >>7/21 R Brachial ALine 7/9 >>7/27 Chest Tubes >> L IJ CVC 7/21 >>7/27 Peg tube 7/27  Significant Diagnostic Tests:  6/22 spirometry with restrictive physiology, preserved FEV1/FVC 6/18 echo: LVEF 66-44%, grade I diastolic dysfunction 0/34 US Abdomen cholelithiasis without cholecystitis. Mild wall thinking in setting of liver disease and ascites.  7/10: LE [place] negative for DVT  7/20 CT ABD w contrast >> stable position of the percutaneous cholecystostomy tube with complete decompression of the gallbladder, trace residual free fluid within the RUQ, decreased since prior, trace bilateral pleural effusions L>R, scattered areas of ground glass airspace disease within the RML, RLL 7/22: CT Chest w/o Contrast: Retrosternal fluid collection, non specific.  Bilateral pleural effusions, portions loculated.   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 Chronic microvascular angiopathy. Stable bilateral mastoid and middle ear effusions.   Micro Data:  COVID and HIV 6/17 >> Neg 7/3 respiratory >> few Candida tropicalis  7/5 blood >> negative  7/5 respiratory >> few Candida  tropicalis 7/7 urine >> negative 7/7 blood >> negative  7/9 wound >> negative 7/12 BAL >> candida tropicalis, candida albicans 7/14 BCID >> candida parapsilosis   7/14 BCx2 >> candida parapsilosis  7/17 BCx2 >> NGTD 7/21 BCx2 >> NGTD 7/22 Respiratory Cx >> candida 7/24 c diff neg  Antimicrobials:  Vanc 6/24>>7/13 Cefuroxime 6/24 >> 6/25 merrem 6/26 >> 7/14 Anidulafungin 7/8 >> 7/14 Zosyn 7/14 >>7/21 Diflucan 7/17 >> Vanco 7/22 >> Mero 7/22 >>  Interim history/subjective:   7/28: T max 99.8 last 24 hours WBC 17.3 ->14.8 Transitioned to eliquis Copious secretions continue Remains Jaundiced but bili and alk phos improving    Objective   Blood pressure (!) 117/46, pulse 76, temperature 98.5 F (36.9 C), resp. rate 21, height _0  (1.753 m), weight 68.5 kg, SpO2 100 %.    Vent Mode: PRVC FiO2 (%):  [40 %] 40 % Set Rate:  [20 bmp] 20 bmp Vt Set:  [420 mL] 420 mL PEEP:  [5 cmH20] 5 cmH20 Plateau Pressure:  [11 cmH20-20 cmH20] 20 cmH20   Intake/Output Summary (Last 24 hours) at 09/05/2019 0748 Last data filed at 09/05/2019 0700 Gross per 24 hour  Intake 1177.69 ml  Output 3540 ml  Net -2362.31 ml   Filed Weights   09/03/19 0500 09/04/19 0400 09/05/19 0500  Weight: 68.2 kg 68.3 kg 68.5 kg   Exam General: Adult male with trach and mechanical ventilator , appears chronically ill, deconditioned, awake at present and attempting to converse HEENT: NCAT, MM dry, old blood noted, trach midline #8 Neuro -awake follows one-step commands, grossly weak CV: s1s2 RRR, SR on monitor, no m/r/g, midline sternal wound with staples, chest tubes x2, serous drainage PULM: Bilateral chest excursion, Coarse rhonchi throughout, chest tube in place GI: soft, bsx4 active, peg tube in place with some tenderness but no rebound or guarding Extremities: warm/dry, no edema , brisk cap refill, clubbing noted, ischemic changes on R finger tip Skin: no rashes or lesions on exposed skin. Sternal  wound with staples,  Sacral decubitus approximately 3x3 inch site, Appears jaundiced   Resolved Hospital Problem list     Assessment & Plan:   Critically ill due to cardiogenic/hemorrhagic shock status post CABG complicated by VT arrest,  S/p VA ECMO (decanulated 6/28). S/p impella placement (6/28> 7/9) Vasopressors are off. - lines removed  - As BP improves, can consider starting heart failure meds -on midodrine 73m tid  Atrial flutter -continue amiodarone, ASA, s/p Bivalirudin for HITT -now on eliquis  Critically ill due to acute hypoxic respiratory failure w/ need for mechanical ventilation - status post tracheostomy Poor lung mechanics due to weakness,  -Xopenex -Chest tube to water seal 7/22, continue.  -Continue Chest PT. Still req relatively freq suctioning -Slow wean, tolerating pressure support 15/5 this am.  -not tolerating atc at this time.  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 --continue diflucan, will need 2 weeks antifungal coverage --Ophthamology has evaluated this admission --All lines out  Acute blood loss anemia and critical illness anemia Heparin-induced thrombocytopenia>> resolved --Bivalirudin stopped and started eliquis --Hgb 7.8 , no evidence  of bleeding - trend CBC - Monitor for bleeding - transfuse as needed for HGB < 7  Acute kidney injury:  -improving -follow indices and uop Hypernatremia: fw held yesterday with peg tube placement -follow hypokalemia>> resolved - Trend BMET - Replete electrolytes as needed   Acute encephalopathy  --CTH without acute  intracranial pathology --Minimize sedating medications --Delirium prevention measures --EEG neg 7/23  --cont thiamine  Sacral Decubitus  --Wound care appreciated  Small left adrenal mass seen on CT scan of the abdomen August 24, 2019  Fever ,suspected HAP downtrending fever curve and wbc.  -cont to monitor.  -no effusions on chest imaging (cxr) -if worsens may benefit from repeat ct to further delineate    Daily Goals Checklist  Pain/Anxiety/Delirium protocol (if indicated): fentanyl as needed only VAP protocol (if indicated): bundle in place.  DVT prophylaxis: eliquis Nutrition Status: resumed tf s/p peg GI prophylaxis: PPI Urinary catheter: n/a Central lines: removed Glucose control: See above Mobility/therapy needs: PT for ROM Code Status: Full  Family Communication: per primary Disposition: ICU, Anticipate LTACH need.   Critical care time: The patient is critically ill with multiple organ systems failure and requires high complexity decision making for assessment and support, frequent evaluation and titration of therapies, application of advanced monitoring technologies and extensive interpretation of multiple databases.  Critical care time 38 mins. This represents my time independent of the NPs time taking care of the pt. This is excluding procedures.    [facility] 09/05/2019, 7:49 AM

## 2019-09-05 NOTE — Progress Notes (Addendum)
TCTS DAILY ICU PROGRESS NOTE                   Todd [name].[address]            [place] [id number]          [id number]   19 Days Post-Op Procedure(s) (LRB): REMOVAL OF IMPELLA LEFT VENTRICULAR ASSIST DEVICE (N/A) TRANSESOPHAGEAL ECHOCARDIOGRAM (TEE) (N/A)  Total Length of Stay:  LOS: 41 days   Subjective: He appears much more alert this am  Objective: Vital signs in last 24 hours: Temp:  [97.7 F (36.5 C)-98.5 F (36.9 C)] 98.5 F (36.9 C) (07/28 0735) Pulse Rate:  [55-84] 76 (07/28 0700) Cardiac Rhythm: Normal sinus rhythm (07/28 0000) Resp:  [16-31] 21 (07/28 0700) BP: (98-136)/(41-88) 117/46 (07/28 0700) SpO2:  [100 %] 100 % (07/28 0700) FiO2 (%):  [40 %] 40 % (07/28 0400) Weight:  [68.5 kg] 68.5 kg (07/28 0500)  Filed Weights   09/03/19 0500 09/04/19 0400 09/05/19 0500  Weight: 68.2 kg 68.3 kg 68.5 kg    Weight change: 0.2 kg   Hemodynamic parameters for last 24 hours:    Intake/Output from previous day: 07/27 0701 - 07/28 0700 In: 1177.7 [I.V.:97.7; NG/GT:780; IV Piggyback:300] Out: 3540 [Urine:2150; Drains:205; Stool:250; Chest Tube:935]  Intake/Output this shift: No intake/output data recorded.  Current Meds: Scheduled Meds: . amiodarone  200 mg Per Tube BID  . aspirin  81 mg Per Tube Daily  . chlorhexidine gluconate (MEDLINE KIT)  15 mL Mouth Rinse BID  . Chlorhexidine Gluconate Cloth  6 each Topical Daily  . cholestyramine  4 g Oral BID  . collagenase   Topical Daily  . feeding supplement (PROSource TF)  45 mL Per Tube Daily  . free water  300 mL Per Tube Q4H  . insulin aspart  0-24 Units Subcutaneous Q4H  . insulin detemir  6 Units Subcutaneous BID  . ipratropium  0.5 mg Nebulization TID BM  . levalbuterol  1.25 mg Nebulization TID BM  . mouth rinse  15 mL Mouth Rinse 10 times per day  . midodrine  15 mg Per Tube TID WC  . nutrition supplement (JUVEN)  1 packet Per Tube BID BM  . pantoprazole sodium  40 mg Per Tube Daily  . sodium  chloride flush  3 mL Intravenous Q12H  . sodium chloride flush  5 mL Intracatheter Q8H  . thiamine injection  100 mg Intravenous Daily   Continuous Infusions: . sodium chloride Stopped (08/13/19 1631)  . bivalirudin (ANGIOMAX) infusion 0.5 mg/mL (Non-ACS indications) 0.055 mg/kg/hr (09/05/19 0700)  . feeding supplement (PIVOT 1.5 CAL) 65 mL/hr at 09/05/19 0700  . fluconazole (DIFLUCAN) IV Stopped (09/05/19 0448)  . vancomycin 750 mg (09/04/19 1918)   PRN Meds:.sodium chloride, acetaminophen (TYLENOL) oral liquid 160 mg/5 mL, dextrose, fentaNYL (SUBLIMAZE) injection, Asani Deniston's butt cream, lip balm, metoprolol tartrate, ondansetron (ZOFRAN) IV  General appearance: no distress Neurologic: appears alert this am, smiling and looking around Heart: RRR Lungs: Diminished left base greater than right Abdomen: Soft, non tender, sporadic bowel sounds present Extremities: Boots in place LEs. Right second finger tip mottled Wound: Clean and dry  Lab Results: CBC: Recent Labs    09/04/19 0049 09/05/19 0040  WBC 17.3* 14.8*  HGB 7.6* 7.8*  HCT 27.2* 28.0*  PLT 289 300   BMET:  Recent Labs    09/04/19 0049 09/05/19 0040  NA 143 146*  K 4.4 3.9  CL 111 111  CO2 22 25  GLUCOSE 187* 212*  BUN 83* 79*  CREATININE 1.16 1.12  CALCIUM 8.0* 8.2*    CMET: Lab Results  Component Value Date   WBC 14.8 (H) 09/05/2019   HGB 7.8 (L) 09/05/2019   HCT 28.0 (L) 09/05/2019   PLT 300 09/05/2019   GLUCOSE 212 (H) 09/05/2019   CHOL 203 (H) 07/27/2019   TRIG 213 (H) 08/27/2019   HDL 34 (L) 07/27/2019   LDLCALC 148 (H) 07/27/2019   ALT 121 (H) 09/05/2019   AST 121 (H) 09/05/2019   NA 146 (H) 09/05/2019   K 3.9 09/05/2019   CL 111 09/05/2019   CREATININE 1.12 09/05/2019   BUN 79 (H) 09/05/2019   CO2 25 09/05/2019   TSH 2.243 07/31/2019   INR 1.4 (H) 09/04/2019   HGBA1C 9.0 (H) 07/26/2019    PT/INR:  Recent Labs    09/04/19 0049  LABPROT 17.1*  INR 1.4*   Radiology: No results  found.   Assessment/Plan: S/P Procedure(s) (LRB): REMOVAL OF IMPELLA LEFT VENTRICULAR ASSIST DEVICE (N/A) TRANSESOPHAGEAL ECHOCARDIOGRAM (TEE) (N/A)   1. CV-Previous paroxysmal a fib/flutter, SR with HR in the 70's.On Amiodarone 200 mg daily per tube, Apixaban 5 mg bid, Midodrine 15 mg tid 2. Pulmonary-VDRF, history of COPD. S/p trach and on vent via trach collar. Chest tube with 935 cc last 24 hours. Continue inhalers.  3. GI-tolerating tube feedings. S/p gastrostomy tube. Has cholecystostomy tube as well 4. ID- on Fluconazole and Vancomycin for possible empyema. Also, blood cultures 07/14 showed Candida Parapsilosis, which he was treated for. WBC this am slightly decreased to 14,800 5. Volume overload-on Lasix 40 daily per tubd 6. Post op blood loss anemia-H and H this am 7.8 and 28 7. CBGs 201/112/202. On Insulin but decreased the past several days secondary to hypoglycemia. 8. Elevated transaminases, bili likely secondary to shock. T bili decreased to 7.4 (19 at its highest), AST and ALT 121, alk phos 347 9. Decubitus ulcer-wound care team following 10. Will need LTAC ; Dr. Servando Snare contacted yesterday but patient declined because still has chest tubes (pleural effusions) and it was questioned if this was prohibiting weaning off vent (has trach collar) and mental status needs to be documented by pulmonary/CCM. Once these issues are resolved, will try to get patient to [place] PA-C 09/05/2019 7:37 AM   mental status continues to improve alert , tracks people coming to room , known to be very hard of hearing  I have seen and examined Todd [name] and agree with the above assessment  and plan.  Todd Isaac MD [facility] [phone] Office [phone] 09/05/2019 8:42 AM

## 2019-09-05 NOTE — Progress Notes (Signed)
   Percutaneous gastric tube placed in IR yesterday  Already in use VSS Afebrile  Site is clean and dry No bleeding +BS  Call if need [place]

## 2019-09-05 NOTE — Progress Notes (Signed)
Patient's wife Synetta Fail called and was updated on this evening's care.  All questions answered. Let Synetta Fail know that patient has started pulling at his medical devices and has pulled ventilator loose from trach twice tonight and that safety mittens are being applied to patient's hands to prevent pulling at his medical equipment. Synetta Fail verbalized understanding the need for mittens.

## 2019-09-05 NOTE — Consult Note (Signed)
WOC Nurse Consult Note: Reason for Consult: finger necrotic and nare Discussed with bedside nurse; nare is healing; no need for topical care  NGT is out  Wound type: Medical Device Related Pressure Injury: nare; healing Necrotic finger; unclear etiology; patient was on pressors but hands were in mitts We are following for [place] PI on the sacrum  Pressure Injury POA: No Measurement: Finger circumferential area of intact blood blister right distal first finger Wound bed: intact not open; nare re-epithelialized  Drainage (amount, consistency, odor) none per nursing flow sheets Periwound: Dressing procedure/placement/frequency: Paint finger with betadine to keep stable, wrap with non adherent dressing. Change daily No topical care needed to the nare wound.   WOC Nurse team will follow with you and see patient within 10 days for wound assessments.  Please notify WOC nurses of any acute changes in the wounds or any new areas of concern Crayton [facility] MSN, RN,CWOCN, CNS, CWON-AP [phone]

## 2019-09-05 NOTE — Progress Notes (Signed)
SLP Cancellation Note  Patient Details Name: Todd [name] MRN: [id number] DOB: [birth year]   Cancelled treatment:       Reason Eval/Treat Not Completed: Medical issues which prohibited therapy. Case was discussed with RN and RT. RT indicated that the pt's respiratory needs have been fluctuating and that they would recommend that in-line PMSV be deferred for this reason. Per RT, they are hopeful that he will be able to be weaned to trach collar this week. SLP will follow up.   Vernecia Umble I. Vear Clock, MS, CCC-SLP Acute Rehabilitation Services Office number [phone] Pager [phone]  Scheryl Marten 09/05/2019, 4:31 PM

## 2019-09-05 NOTE — TOC Progression Note (Signed)
Transition of Care [facility]) - Progression Note    Patient Details  Name: Todd [name] MRN: [id number] Date of Birth: Nov 13, 1955  [facility] CM/SW Contact  [name], RN Phone Number: 09/05/2019, 11:45 AM  Clinical Narrative:    [name], RNCM, called from [facility] and stated that the patient's insurance has denied admission to [facility] for the patient.  [name] is sending additional clinicals and written appeal to send to the insurance company.  I called the wife and left a message on her cell phone and I will update her on the progress of the appeal for LTAC.  Expected Discharge Plan: Long Term Acute Care (LTAC) Barriers to Discharge: Insurance Authorization  Expected Discharge Plan and Services Expected Discharge Plan: Long Term Acute Care (LTAC)   Discharge Planning Services: CM Consult Post Acute Care Choice: Long Term Acute Care (LTAC) Living arrangements for the past 2 months: Single Family Home                                       Social Determinants of Health (SDOH) Interventions    Readmission Risk Interventions Readmission Risk Prevention Plan 09/03/2019  Transportation Screening Complete  PCP or Specialist Appt within 3-5 Days Complete  HRI or Home Care Consult Complete  Social Work Consult for Recovery Care Planning/Counseling Complete  Palliative Care Screening Complete  Medication Review [employer]) Complete  Some recent data might be hidden

## 2019-09-06 DIAGNOSIS — I5021 Acute systolic (congestive) heart failure: Secondary | ICD-10-CM | POA: Diagnosis not present

## 2019-09-06 DIAGNOSIS — B377 Candidal sepsis: Secondary | ICD-10-CM | POA: Diagnosis not present

## 2019-09-06 DIAGNOSIS — J80 Acute respiratory distress syndrome: Secondary | ICD-10-CM | POA: Diagnosis not present

## 2019-09-06 LAB — BASIC METABOLIC PANEL
Anion gap: 11 (ref 5–15)
BUN: 77 mg/dL — ABNORMAL HIGH (ref 8–23)
CO2: 21 mmol/L — ABNORMAL LOW (ref 22–32)
Calcium: 8.3 mg/dL — ABNORMAL LOW (ref 8.9–10.3)
Chloride: 114 mmol/L — ABNORMAL HIGH (ref 98–111)
Creatinine, Ser: 0.98 mg/dL (ref 0.61–1.24)
GFR calc Af Amer: >60 mL/min (ref >60)
GFR calc non Af Amer: >60 mL/min (ref >60)
Glucose, Bld: 197 mg/dL — ABNORMAL HIGH (ref 70–99)
Potassium: 4.1 mmol/L (ref 3.5–5.1)
Sodium: 146 mmol/L — ABNORMAL HIGH (ref 135–145)

## 2019-09-06 LAB — CBC
HCT: 26.4 % — ABNORMAL LOW (ref 39.0–52.0)
Hemoglobin: 7.5 g/dL — ABNORMAL LOW (ref 13.0–17.0)
MCH: 30.7 pg (ref 26.0–34.0)
MCHC: 28.4 g/dL — ABNORMAL LOW (ref 30.0–36.0)
MCV: 108.2 fL — ABNORMAL HIGH (ref 80.0–100.0)
Platelets: 282 10*3/uL (ref 150–400)
RBC: 2.44 MIL/uL — ABNORMAL LOW (ref 4.22–5.81)
RDW: 22.5 % — ABNORMAL HIGH (ref 11.5–15.5)
WBC: 16.5 10*3/uL — ABNORMAL HIGH (ref 4.0–10.5)
nRBC: 0 % (ref 0.0–0.2)

## 2019-09-06 LAB — GLUCOSE, CAPILLARY
Glucose-Capillary: 179 mg/dL — ABNORMAL HIGH (ref 70–99)
Glucose-Capillary: 132 mg/dL — ABNORMAL HIGH (ref 70–99)
Glucose-Capillary: 201 mg/dL — ABNORMAL HIGH (ref 70–99)
Glucose-Capillary: 185 mg/dL — ABNORMAL HIGH (ref 70–99)
Glucose-Capillary: 197 mg/dL — ABNORMAL HIGH (ref 70–99)
Glucose-Capillary: 168 mg/dL — ABNORMAL HIGH (ref 70–99)

## 2019-09-06 MED ORDER — TRAZODONE HCL 50 MG PO TABS
50.0000 mg | ORAL_TABLET | Freq: Every evening | ORAL | Status: DC | PRN
  Administered 2019-09-06 – 2019-09-11 (×4): 50 mg via ORAL
  Filled 2019-09-06 (×4): qty 1

## 2019-09-06 MED ORDER — MIDODRINE HCL 5 MG PO TABS
10.0000 mg | ORAL_TABLET | Freq: Three times a day (TID) | ORAL | Status: DC
  Administered 2019-09-06 (×3): 10 mg
  Filled 2019-09-06 (×3): qty 2

## 2019-09-06 NOTE — Progress Notes (Signed)
Occupational Therapy Treatment Patient Details Name: Todd [name] MRN: [id number] DOB: 10-Jul-1955 Today's Date: 09/06/2019    History of present illness [name] is a 64 y.o. male presenting with shortness of breath; noted AKI, CHF, recent PNA, elevated troponin, pulm edema/effusion, and cardiomegaly.Marland Kitchen CABG 6/24 and intubated (extubated 6/25)--went into cardiac arrest on same date with re-opening of chest for heart massage and direct epicardial paddles and place on ECMO. 08/03/19 washout out for tamponade. 7/2 sternal closure and wound vac. 7/9 Impella removed.08/20/19 Tracheostomy 08/24/19 cholecystostomy tube. PMHx: HFrEF, COPD, HTN, T2DM, HLD, MDD, chronic back pain, vitamin deficiency, BPH, tobacco use disorder, marijuana use disorder   OT comments  Pt alert and following commands more consistently. Appears to demonstrate apparent sensory motor deficits, especially with LU/LE. Able to assist with wiping mouth, using toothette for oral care with R hand and participating in bed level exercise/activities. Bedside table placed over pt to encourage pt to use BUE for table top activity. Pt alert and interactive and smiling thorughout session. Pt attempting to use R bedrail to reposition self. Pt left in modified chair position. Encourage chair position several times a day with nursing. Feel pt is an excellent candidate for use of tilt bed to increase mobility for rehab. Will discuss with MD. Will continue to follow acutely.   Follow Up Recommendations  LTACH;Supervision/Assistance - 24 hour    Equipment Recommendations  Other (comment) (TBA)    Recommendations for Other Services      Precautions / Restrictions Precautions Precautions: Fall;Sternal;Other (comment) Precaution Comments: vent, flexi-seal, male pure wick, chole drain, left chest tube, sacral wound, trach Required Braces or Orthoses: Other Brace Other Brace: B Prevalon Restrictions Other Position/Activity Restrictions: sternal  precautions       Mobility Bed Mobility               General bed mobility comments: Used chair position.Pt reaching for R bedrail to attempt to adjust self in bed  Transfers                 General transfer comment: Not attempted; will need [place]/lift    Balance Overall balance assessment: Needs assistance   Sitting balance-Leahy Scale: Poor Sitting balance - Comments: supported sitting; attempts at righting observed                                   ADL either performed or assessed with clinical judgement   ADL   Eating/Feeding: NPO   Grooming: Maximal assistance                                 General ADL Comments: Able to wipe mouth adn bring toothette to lips; difficulty completing oral hygiene - appears difficulty with motor planning     Vision   Vision Assessment?: Vision impaired- to be further tested in functional context Additional Comments: pt looking L this session. Tracking therapist around room. Visually attending to therapist; will further assess   Perception     Praxis      Cognition Arousal/Alertness: Awake/alert Behavior During Therapy: Flat affect (appropirate; smiling frequently) Overall Cognitive Status: Difficult to assess                         Following Commands: Follows one step commands inconsistently   Awareness: Intellectual Problem Solving: Slow processing;Decreased initiation;Difficulty  sequencing;Requires verbal cues;Requires tactile cues General Comments: nodding head "yes" to most all questions, however did appear to process information better this session. Pt moving BUE spontaneously although better control of RU/LE movement; noted trigger finger R middle; pt rubbing his head with his R hadn and nodded yes when asked if his head hurt; able to follow command to bring toothette to mouth and wipe off mouth        Exercises Exercises: General Upper Extremity General Exercises -  Upper Extremity Shoulder Flexion: AAROM;Both;10 reps;Seated Elbow Flexion: AAROM;Both;10 reps;Seated Elbow Extension: AAROM;Both;10 reps;Seated Wrist Flexion: AAROM;Both;10 reps;Seated Wrist Extension: AAROM;Both;10 reps;Seated Digit Composite Flexion: AAROM;Both;10 reps;Seated Composite Extension: AAROM;Both;10 reps;Seated Other Exercises Other Exercises: bedside table placed over pt to attempt to have pt complete table slides; reach of objects on table; pt helping to place hands on table, nodding and smiling Other Exercises: gentle L scapular elevation/drepression - significantly tight with poor scapular glide   Shoulder Instructions       General Comments Moving RUE spontaneously; Weak throughout however AAROM grossly WFL; Abnormal scapular rythm ; ablet o maintain grasp on washcloth adn bring to mouth with set up; ablet o rub top of head sontaneously; black area on R index tip; LUE generally weaker adn more uncoordinated than R; Appears to demosntrate motor sensory impairment; abnormal scap/humeral rythm. will further assess    Pertinent Vitals/ Pain       Pain Assessment: Faces Faces Pain Scale: Hurts a little bit Pain Location: L shoulder ROM; general discomfort with sitting Pain Descriptors / Indicators: Grimacing Pain Intervention(s): Limited activity within patient's tolerance  Home Living                                          Prior Functioning/Environment              Frequency  Min 2X/week        Progress Toward Goals  OT Goals(current goals can now be found in the care plan section)  Progress towards OT goals: Progressing toward goals  Acute Rehab OT Goals Patient Stated Goal: pt unable to state (on vent) OT Goal Formulation: Patient unable to participate in goal setting Time For Goal Achievement: 09/16/19 Potential to Achieve Goals: Fair ADL Goals Pt Will Perform Grooming: with max assist;bed level;sitting Additional ADL Goal #1:  Pt will follow one step commands 50% of time Additional ADL Goal #2: Pt will be able to look left and maintain for 30 seconds Additional ADL Goal #3: Family will be independent with Bil UE PROM exercise program  Plan Discharge plan remains appropriate    Co-evaluation                 AM-PAC OT "6 Clicks" Daily Activity     Outcome Measure   Help from another person eating meals?: Total Help from another person taking care of personal grooming?: A Lot Help from another person toileting, which includes using toliet, bedpan, or urinal?: Total Help from another person bathing (including washing, rinsing, drying)?: Total Help from another person to put on and taking off regular upper body clothing?: Total Help from another person to put on and taking off regular lower body clothing?: Total 6 Click Score: 7    End of Session Equipment Utilized During Treatment: Other (comment) (PMSV; 30%; Peep 5)  OT Visit Diagnosis: Other abnormalities of gait and mobility (R26.89);Muscle weakness (generalized) (  M62.81);Other symptoms and signs involving cognitive function;Pain Pain - Right/Left: Left Pain - part of body: Shoulder (general discomfort)   Activity Tolerance Patient tolerated treatment well   Patient Left in bed;with call bell/phone within reach;with bed alarm set;with restraints reapplied;with SCD's reapplied (mitts; modified chair position)   Nurse Communication Mobility status;Other (comment) (encourage chair position)        Time: 9509-3267 OT Time Calculation (min): 39 min  Charges: OT General Charges $OT Visit: 1 Visit OT Treatments $Self Care/Home Management : 8-22 mins $Neuromuscular Re-education: 23-37 mins  Luisa Dago, OT/L   Acute OT Clinical Specialist Acute Rehabilitation Services Pager [phone] Office [phone]    [facility] 09/06/2019, 6:03 PM

## 2019-09-06 NOTE — Progress Notes (Signed)
Patient's wife Synetta Fail called and was updated on tonight's care including that patient has been given medicine to help him sleep since he has not slept at night nor much during the day.  All questions answered.

## 2019-09-06 NOTE — Progress Notes (Signed)
Patient ID: Todd [name], male   DOB: March 09, 1955, 64 y.o.   MRN: [id number] TCTS Evening Rounds:  Hemodynamically stable.  Remains on vent. Did not wean today.   More alert and following commands.

## 2019-09-06 NOTE — Progress Notes (Addendum)
TCTS DAILY ICU PROGRESS NOTE                   [address]            [place], [address]          [phone]  35 Days s/p CABG x 4 20 Days Post-Op Procedure(s) (LRB): REMOVAL OF IMPELLA LEFT VENTRICULAR ASSIST DEVICE (N/A) TRANSESOPHAGEAL ECHOCARDIOGRAM (TEE) (N/A)  Total Length of Stay:  LOS: 42 days   Subjective: He is alert and follows some commands this am.   Objective: Vital signs in last 24 hours: Temp:  [97.9 F (36.6 C)-98.5 F (36.9 C)] 97.9 F (36.6 C) (07/29 0348) Pulse Rate:  [68-83] 79 (07/29 0700) Cardiac Rhythm: Normal sinus rhythm (07/29 0400) Resp:  [12-31] 20 (07/29 0700) BP: (113-136)/(39-71) 118/69 (07/29 0700) SpO2:  [81 %-100 %] 99 % (07/29 0700) FiO2 (%):  [40 %] 40 % (07/29 0433) Weight:  [69.2 kg] 69.2 kg (07/29 0309)  Filed Weights   09/04/19 0400 09/05/19 0500 09/06/19 0309  Weight: 68.3 kg 68.5 kg 69.2 kg    Weight change: 0.7 kg   Hemodynamic parameters for last 24 hours:    Intake/Output from previous day: 07/28 0701 - 07/29 0700 In: 4459.2 [I.V.:10.1; NG/GT:3360; IV Piggyback:399.2] Out: 2895 [Urine:2275; Drains:130; Stool:50; Chest Tube:440]  Intake/Output this shift: No intake/output data recorded.  Current Meds: Scheduled Meds: . amiodarone  200 mg Per Tube Daily  . apixaban  5 mg Per Tube BID  . aspirin  81 mg Per Tube Daily  . chlorhexidine gluconate (MEDLINE KIT)  15 mL Mouth Rinse BID  . Chlorhexidine Gluconate Cloth  6 each Topical Daily  . cholestyramine  4 g Oral BID  . collagenase   Topical Daily  . feeding supplement (PROSource TF)  45 mL Per Tube Daily  . free water  300 mL Per Tube Q4H  . furosemide  40 mg Per Tube Daily  . insulin aspart  0-24 Units Subcutaneous Q4H  . insulin detemir  8 Units Subcutaneous BID  . ipratropium  0.5 mg Nebulization TID BM  . levalbuterol  1.25 mg Nebulization TID BM  . mouth rinse  15 mL Mouth Rinse 10 times per day  . midodrine  10 mg Per Tube TID WC  .  nutrition supplement (JUVEN)  1 packet Per Tube BID BM  . pantoprazole sodium  40 mg Per Tube Daily  . sodium chloride flush  3 mL Intravenous Q12H  . sodium chloride flush  5 mL Intracatheter Q8H  . thiamine injection  100 mg Intravenous Daily   Continuous Infusions: . sodium chloride Stopped (08/13/19 1631)  . feeding supplement (PIVOT 1.5 CAL) 65 mL/hr at 09/05/19 0700  . fluconazole (DIFLUCAN) IV Stopped (09/06/19 0512)  . vancomycin Stopped (09/05/19 1519)   PRN Meds:.sodium chloride, acetaminophen (TYLENOL) oral liquid 160 mg/5 mL, dextrose, fentaNYL (SUBLIMAZE) injection, Gerhardt's butt cream, lip balm, metoprolol tartrate, ondansetron (ZOFRAN) IV  General appearance: awake, alert and in no distress Neurologic: appears alert this am, smiling and looking around, able to follow some commands Heart: RRR Lungs: Diminished bibasilar breath sounds L>R Abdomen: Soft, non tender, bowel sounds present Extremities: Boots in place LEs. In mittens so he does not grab lines etc Wounds: Clean and dry  Lab Results: CBC: Recent Labs    09/05/19 0040 09/06/19 0200  WBC 14.8* 16.5*  HGB 7.8* 7.5*  HCT 28.0* 26.4*  PLT 300 282   BMET:  Recent  Labs    09/05/19 0040 09/06/19 0200  NA 146* 146*  K 3.9 4.1  CL 111 114*  CO2 25 21*  GLUCOSE 212* 197*  BUN 79* 77*  CREATININE 1.12 0.98  CALCIUM 8.2* 8.3*    CMET: Lab Results  Component Value Date   WBC 16.5 (H) 09/06/2019   HGB 7.5 (L) 09/06/2019   HCT 26.4 (L) 09/06/2019   PLT 282 09/06/2019   GLUCOSE 197 (H) 09/06/2019   CHOL 203 (H) 07/27/2019   TRIG 213 (H) 08/27/2019   HDL 34 (L) 07/27/2019   LDLCALC 148 (H) 07/27/2019   ALT 121 (H) 09/05/2019   AST 121 (H) 09/05/2019   NA 146 (H) 09/06/2019   K 4.1 09/06/2019   CL 114 (H) 09/06/2019   CREATININE 0.98 09/06/2019   BUN 77 (H) 09/06/2019   CO2 21 (L) 09/06/2019   TSH 2.243 07/31/2019   INR 1.4 (H) 09/04/2019   HGBA1C 9.0 (H) 07/26/2019    PT/INR:  Recent  Labs    09/04/19 0049  LABPROT 17.1*  INR 1.4*   Radiology: No results found.   Assessment/Plan: S/P Procedure(s) (LRB): REMOVAL OF IMPELLA LEFT VENTRICULAR ASSIST DEVICE (N/A) TRANSESOPHAGEAL ECHOCARDIOGRAM (TEE) (N/A)   1. CV-Previous paroxysmal a fib/flutter, SR with HR in the 70's.On Amiodarone 200 mg daily per tube, Apixaban 5 mg bid, Midodrine 15 mg tid 2. Pulmonary-VDRF, history of COPD. S/p trach and on vent via trach collar. Chest tube with 440 cc last 24 hours. Continue inhalers.  3. GI-NPO,tolerating tube feedings. S/p gastrostomy tube. Has cholecystostomy tube as well 4. ID- on Fluconazole and Vancomycin for possible empyema. Also, blood cultures 07/14 showed Candida Parapsilosis, which he was treated for. WBC this am slightly increased to 16,500 5. Volume overload-on Lasix 40 daily per tube 6. Post op blood loss anemia-H and H this am decreased to 7.5 and 26.4 7. CBGs 166/179/132. On Insulin. 8. Elevated transaminases, bili likely secondary to shock. T bili decreased to 7.4 (19 at its highest), AST and ALT 121, alk phos 347 9. Decubitus ulcer-wound care team following 10. Will need LTAC   Donielle Liston Alba PA-C 09/06/2019 7:24 AM     Chart reviewed, patient examined, agree with above. Has not made much progress with vent wean. CXR yesterday looks ok. There are small bilateral pleural effusions and left base atelectasis. He has bilateral chest tubes in that are still draining. Will keep in for now.

## 2019-09-06 NOTE — Progress Notes (Signed)
NAME:  Todd [name], MRN:  [id number], DOB:  1955/09/03, LOS: 54 ADMISSION DATE:  07/26/2019, CONSULTATION DATE:  08/03/19 REFERRING MD:  Roxan Hockey, CHIEF COMPLAINT:  ECMO   Brief History   64 yo male admitted with CHF exacerbation, found to have multi-vessel disease requiring CABG 6/24, post op VT Arrest 6/25 and cannulated for VA ECMO.    History of present illness   Presented with worsening dyspnea 6/17 c/w CHF exacerbation.  LHC  with severe triple vessel disease.  Underwent CABG 6/24.  Vtach arrest 6/25.  Chest opened bedside and cardiac massage initiated as well as multiple cardioversions, amiodarone, bicarb etc.  Brought to OR and cannulated for VA ECMO.  PCCM consulted to assist with management  Comorbidities include DM, heavy smoking, COPD  Past Medical History  Depression Ischemic cardiomyopathy HTN HLD  Significant Hospital Events   6/24 CABG 6/25 VA cannulation 6/28 decannulated and impella placed 6/29 bedside re-exploration; s/p decannulation. Placement of impella device originally at p8 but decreased to p4 2/2 suction events overnight up to p6. Echo completed and repositioned device. Remained on considerable support with 8 epi and 46 norepi vaso 0.05. continued chest tube output with large clots noted. Heparin thru device. Replacement products ongoing. 60% 8 peep 6/30 bedside mediastinal re-exploration and clean out of hematoma with tamponade and worsening hemodynamics. Pt had large volume transfusion. rebolused with amio 2/2 nsvt episode.  Weight up 48 pounds.  Started diuresis, Lasix drip started. 7/01 iatrogenic respiratory alkalosis, vent rate decreased. 7/02 No significant issues overnight remains on pressors and Impella not tolerating tube feeds with high gastric output awaiting core track placement 7/03 started trickle TF  7/05 Swab removed, CVL and PICC placed. 7/13 Trach and BAL 7/16 Awake this AM and tracking. No follow commands. Still having fevers. Successful  ultrasound and CT guided placement of a 10.2 French cholecystostomy tube. A small amount of aspirated bile was capped and sent to the laboratory for analysis 7/17 Off sedation gtt. doing pressure support ventilation.  Beginning to follow some commands occasionally intermittently.  Very jaundiced.  Growing Candida in the blood and BAL.  Still febrile but T-max is coming down.  WBC persistently elevated; currently at 19.4 okay.  Diflucan started. Currently on milrinone GTT, amiodarone gtt., vasopressin gtt., Levophed gtt. and bivalrudin gtt. Off epi gtt. Cards planning lasix x 1 today. 7/21 Ongoing fevers, WBC increased 7/22: Persistent fevers, pan cultured with CT Chest/Head. Suspected source tracheobronchitis.   Consults:  Advanced heart failure, PCCM, TCTS  Procedures:  LUE PICC 7/5 >>7/21 R Brachial ALine 7/9 >>7/27 Chest Tubes >> L IJ CVC 7/21 >>7/27 Peg tube 7/27  Significant Diagnostic Tests:  6/22 spirometry with restrictive physiology, preserved FEV1/FVC 6/18 echo: LVEF 88-91%, grade I diastolic dysfunction 6/94 US Abdomen cholelithiasis without cholecystitis. Mild wall thinking in setting of liver disease and ascites.  7/10: LE [place] negative for DVT  7/20 CT ABD w contrast >> stable position of the percutaneous cholecystostomy tube with complete decompression of the gallbladder, trace residual free fluid within the RUQ, decreased since prior, trace bilateral pleural effusions L>R, scattered areas of ground glass airspace disease within the RML, RLL 7/22: CT Chest w/o Contrast: Retrosternal fluid collection, non specific.  Bilateral pleural effusions, portions loculated.   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 Chronic microvascular angiopathy. Stable bilateral mastoid and middle ear effusions.   Micro Data:  COVID and HIV 6/17 >> Neg 7/3 respiratory >> few Candida tropicalis  7/5 blood >> negative  7/5 respiratory >> few Candida  tropicalis 7/7 urine >> negative 7/7 blood >> negative  7/9 wound >> negative 7/12 BAL >> candida tropicalis, candida albicans 7/14 BCID >> candida parapsilosis   7/14 BCx2 >> candida parapsilosis  7/17 BCx2 >> NGTD 7/21 BCx2 >> NGTD 7/22 Respiratory Cx >> candida 7/24 c diff neg  Antimicrobials:  Vanc 6/24>>7/13 Cefuroxime 6/24 >> 6/25 merrem 6/26 >> 7/14 Anidulafungin 7/8 >> 7/14 Zosyn 7/14 >>7/21 Diflucan 7/17 >> Vanco 7/22 >> Mero 7/22 >>  Interim history/subjective:  7/29: pt with agitation/restlessness overnight. Trazodone added by CTS. Pt is alert and pleasant this am. Appears comfortable on decreased ps settings 12/5 will cont to decrease. HF weaning midodrine today and attempting diuresis. Wbc is up somewhat but afebrile 7/28: T max 99.8 last 24 hours WBC 17.3 ->14.8 Transitioned to eliquis Copious secretions continue Remains Jaundiced but bili and alk phos improving    Objective   Blood pressure 118/69, pulse 79, temperature 100.2 F (37.9 C), resp. rate 20, height _0  (1.753 m), weight 69.2 kg, SpO2 100 %.    Vent Mode: CPAP;PSV FiO2 (%):  [30 %-40 %] 30 % Set Rate:  [20 bmp] 20 bmp Vt Set:  [420 mL] 420 mL PEEP:  [5 cmH20] 5 cmH20 Pressure Support:  [12 cmH20-15 cmH20] 12 cmH20 Plateau Pressure:  [11 cmH20-25 cmH20] 11 cmH20   Intake/Output Summary (Last 24 hours) at 09/06/2019 0803 Last data filed at 09/06/2019 0700 Gross per 24 hour  Intake 4186.34 ml  Output 2895 ml  Net 1291.34 ml   Filed Weights   09/04/19 0400 09/05/19 0500 09/06/19 0309  Weight: 68.3 kg 68.5 kg 69.2 kg   Exam General: Adult male with trach and mechanical ventilator , appears chronically ill, deconditioned but comfortable on ps trial, awake at present and attempting to converse HEENT: NCAT, MM dry, old blood noted, trach midline #8 Neuro -awake follows one-step commands, grossly weak CV: s1s2 RRR, SR on monitor, no m/r/g, midline sternal wound with staples, chest tubes in  place, serous drainage PULM: Bilateral chest excursion,diminished throughout, chest tube in place GI: soft, bsx4 active, peg tube in place with improved tenderness but no rebound or guarding Extremities: warm/dry, no edema , brisk cap refill, clubbing noted, ischemic changes on R finger tip Skin: no rashes or lesions on exposed skin. Sternal wound with staples,  Sacral decubitus approximately 3x3 inch site, Appears jaundiced  Lab Results  Component Value Date   WBC 16.5 (H) 09/06/2019   HGB 7.5 (L) 09/06/2019   HCT 26.4 (L) 09/06/2019   MCV 108.2 (H) 09/06/2019   PLT 282 09/06/2019   Lab Results  Component Value Date   CREATININE 0.98 09/06/2019   BUN 77 (H) 09/06/2019   NA 146 (H) 09/06/2019   K 4.1 09/06/2019   CL 114 (H) 09/06/2019   CO2 21 (L) 09/06/2019   Resolved Hospital Problem list     Assessment & Plan:   Critically ill due to cardiogenic/hemorrhagic shock status post CABG complicated by VT arrest,  S/p VA ECMO (decanulated 6/28). S/p impella placement (6/28> 7/9) Vasopressors are off. - lines removed  - As BP improves, can consider starting heart failure meds -HF decreasing midodrine 68m tid  Atrial flutter -continue amiodarone, ASA, s/p Bivalirudin for HITT -now on eliquis  Critically ill due to acute hypoxic respiratory failure w/ need for mechanical ventilation - status post tracheostomy Poor lung mechanics due to weakness,  -Xopenex -Chest tube to water  seal 7/22, continue.  -Continue Chest PT. Still req relatively freq suctioning -Slow wean, tolerating pressure support 12/5 this am.  -not tolerating atc at this time.  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 --continue diflucan, will need 2 weeks antifungal coverage --Ophthamology has evaluated this admission --All lines out  Acute blood loss anemia and critical illness anemia Heparin-induced thrombocytopenia>> resolved --Bivalirudin stopped and started eliquis --Hgb 7.5 , no evidence of bleeding - trend CBC - Monitor for bleeding - transfuse as needed for HGB < 7  Acute kidney injury:  -improving -follow indices and uop Hypernatremia: -stable but elevated at 146 -cont fwf   Acute encephalopathy  --CTH without acute intracranial pathology --Minimize sedating medications --Delirium prevention measures --EEG neg 7/23  --cont thiamine  Sacral Decubitus  --Wound care appreciated  Small left adrenal mass seen on CT scan of the abdomen August 24, 2019  Fever ,suspected HAP downtrending fever curve, but wbc has increased somewhat -cont to monitor.  -no effusions on chest imaging (cxr) -if wbc worsens may benefit from repeat ct to further delineate need for intervention of potentially loculated effusion seen on 7/23 but not present or overt on cxr.   Daily Goals Checklist  Pain/Anxiety/Delirium protocol (if indicated): fentanyl as needed only VAP protocol (if indicated): bundle in place.  DVT prophylaxis: eliquis Nutrition Status: tf GI prophylaxis: PPI Urinary catheter: n/a Central lines: removed Glucose control: See above Mobility/therapy needs: PT for ROM Code Status: Full  Family Communication: per primary Disposition: ICU, Anticipate LTACH need.   Critical care time: The patient is critically ill with multiple organ systems failure and requires high complexity decision making for assessment and support, frequent evaluation and titration of therapies, application of advanced monitoring technologies and extensive interpretation of multiple databases.   Critical care time 36 mins. This represents my time independent of the NPs time taking care of the pt. This is excluding procedures.    [facility] 09/06/2019, 8:03 AM

## 2019-09-06 NOTE — Progress Notes (Addendum)
Patient ID: Todd [name], male   DOB: Mar 10, 1955, 64 y.o.   MRN: [id number]     Advanced Heart Failure Rounding Note  PCP-Cardiologist: No primary care provider on file.   Subjective:    08/02/19 CABG 08/03/19 VF arrest. Chest open at bedside 08/03/19 Back to OR for ECMO cannulation 08/03/19 Washout out for tamponade 08/04/19 Repeat bedside washout for tamponade 08/06/19 To OR for decannulation and Impella 5.5 08/07/19 Underwent emergent bedside chest exploration at bedside for tamponade  08/10/19 Chest closed 08/16/19 HIT positive, bivalirudin begun 08/17/19 Impella removed 08/20/19 Tracheostomy 08/23/19 HIDA scan showed patent common bile duct (unable to visualize GB, unable to rule out acalculous cholecystitis).  08/24/19 cholecystostomy tube 08/30/19 chest CT suggestive of possible empyema 08/31/19 ECG with diffuse encephalopathy.  Remains on vent via trach.  Has not done well with weaning. Awake and following commands.   Afebrile, WBCs 16.5.    TFs via PEG tube.   He remains in NSR on amiodarone.   ABx/antifungals: Meropenem and [name] stopped 7/14  Zosyn stopped 7/21.   Meropenem restarted 7/22 Candida from BAL and blood culture, started on fluconazole.   Now on fluconazole and vanc. Meropenem discontinued 7/26    Objective:   Weight Range: 69.2 kg Body mass index is 22.53 kg/m.   Vital Signs:   Temp:  [97.9 F (36.6 C)-98.5 F (36.9 C)] 97.9 F (36.6 C) (07/29 0348) Pulse Rate:  [68-83] 79 (07/29 0700) Resp:  [12-31] 20 (07/29 0700) BP: (113-136)/(39-71) 118/69 (07/29 0700) SpO2:  [81 %-100 %] 99 % (07/29 0700) FiO2 (%):  [40 %] 40 % (07/29 0433) Weight:  [69.2 kg] 69.2 kg (07/29 0309) Last BM Date: 09/05/19  Weight change: Filed Weights   09/04/19 0400 09/05/19 0500 09/06/19 0309  Weight: 68.3 kg 68.5 kg 69.2 kg    Intake/Output:   Intake/Output Summary (Last 24 hours) at 09/06/2019 0719 Last data filed at 09/06/2019 0700 Gross per 24 hour  Intake 4459.23 ml   Output 2895 ml  Net 1564.23 ml      Physical Exam    General: NAD Neck: No JVD, no thyromegaly or thyroid nodule.  Lungs: Clear to auscultation bilaterally with normal respiratory effort. CV: Nondisplaced PMI.  Heart regular S1/S2, no S3/S4, no murmur.  1+ ankle edema.  Abdomen: Soft, nontender, no hepatosplenomegaly, no distention.  Skin: Jaundiced.  Neurologic:Follows commands.  Extremities: No clubbing or cyanosis.  HEENT: Normal.    Telemetry   NSR 70.  Personally reviewed   Labs    CBC Recent Labs    09/04/19 0049 09/04/19 0049 09/05/19 0040 09/06/19 0200  WBC 17.3*   < > 14.8* 16.5*  NEUTROABS 14.1*  --   --   --   HGB 7.6*   < > 7.8* 7.5*  HCT 27.2*   < > 28.0* 26.4*  MCV 108.8*   < > 108.9* 108.2*  PLT 289   < > 300 282   < > = values in this interval not displayed.   Basic Metabolic Panel Recent Labs    09/05/19 0040 09/06/19 0200  NA 146* 146*  K 3.9 4.1  CL 111 114*  CO2 25 21*  GLUCOSE 212* 197*  BUN 79* 77*  CREATININE 1.12 0.98  CALCIUM 8.2* 8.3*  MG 2.4  --    Liver Function Tests Recent Labs    09/05/19 0040  AST 121*  ALT 121*  ALKPHOS 347*  BILITOT 7.4*  PROT 5.8*  ALBUMIN 1.6*   No  results for input(s): LIPASE, AMYLASE in the last 72 hours. Cardiac Enzymes No results for input(s): CKTOTAL, CKMB, CKMBINDEX, TROPONINI in the last 72 hours.  BNP: BNP (last 3 results) Recent Labs    07/26/19 1236  BNP 2,568.2*    ProBNP (last 3 results) No results for input(s): PROBNP in the last 8760 hours.   D-Dimer No results for input(s): DDIMER in the last 72 hours. Hemoglobin A1C No results for input(s): HGBA1C in the last 72 hours. Fasting Lipid Panel No results for input(s): CHOL, HDL, LDLCALC, TRIG, CHOLHDL, LDLDIRECT in the last 72 hours. Thyroid Function Tests No results for input(s): TSH, T4TOTAL, T3FREE, THYROIDAB in the last 72 hours.  Invalid input(s): FREET3  Other results:   Imaging    No results  found.   Medications:     Scheduled Medications: . amiodarone  200 mg Per Tube Daily  . apixaban  5 mg Per Tube BID  . aspirin  81 mg Per Tube Daily  . chlorhexidine gluconate (MEDLINE KIT)  15 mL Mouth Rinse BID  . Chlorhexidine Gluconate Cloth  6 each Topical Daily  . cholestyramine  4 g Oral BID  . collagenase   Topical Daily  . feeding supplement (PROSource TF)  45 mL Per Tube Daily  . free water  300 mL Per Tube Q4H  . furosemide  40 mg Per Tube Daily  . insulin aspart  0-24 Units Subcutaneous Q4H  . insulin detemir  8 Units Subcutaneous BID  . ipratropium  0.5 mg Nebulization TID BM  . levalbuterol  1.25 mg Nebulization TID BM  . mouth rinse  15 mL Mouth Rinse 10 times per day  . midodrine  10 mg Per Tube TID WC  . nutrition supplement (JUVEN)  1 packet Per Tube BID BM  . pantoprazole sodium  40 mg Per Tube Daily  . sodium chloride flush  3 mL Intravenous Q12H  . sodium chloride flush  5 mL Intracatheter Q8H  . thiamine injection  100 mg Intravenous Daily    Infusions: . sodium chloride Stopped (08/13/19 1631)  . feeding supplement (PIVOT 1.5 CAL) 65 mL/hr at 09/05/19 0700  . fluconazole (DIFLUCAN) IV Stopped (09/06/19 0512)  . vancomycin Stopped (09/05/19 1519)    PRN Medications: sodium chloride, acetaminophen (TYLENOL) oral liquid 160 mg/5 mL, dextrose, fentaNYL (SUBLIMAZE) injection, Gerhardt's butt cream, lip balm, metoprolol tartrate, ondansetron (ZOFRAN) IV   Assessment/Plan   1. Shock: Mixed cardiogenic/septic.  Echo with EF 20-25%, moderate RV dysfunction.  VA ECMO post-VF arrest, cannulated 6/25.  Stable s/p return to OR for mediastinal re-exploration due to bleeding on 6/25.  Decannulated from [place] ECMO on 08/06/19. Impella 5.5 placed. Underwent emergent chest re-exploration on 6/29 for tamponade and clot removal. Chest closed 7/2.  Impella 5.5 removed 7/9.  Milrinone stopped 7/23, now off norepinephrine as well. Suspect primarily septic/vasodilatory shock  with fever, elevated WBCs => now resolved. No central line access to check CVPs but continues w/ edema although may be secondary to hypoalbuemia (1.6).  Creatinine stable.  Good UOP but weight up a bit.  - Watch today with Lasix 40 mg po daily, may need to increase dose.  - Can start to wean midodrine, decrease to 10 mg tid.  2. 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  Prolonged VF arrest on 6/25. Chest closed 7/2.  - ASA  - Holding Crestor with elevated LFTs.  3. Cardiac arrest: VF arrest with prolonged code. Episodes  of VT/NSVT in setting of low K (difficult to effectively replace). K now stable, minimal ectopy.  - Continue amiodarone 200 mg daily.  - Keep K > 4.0 Mg > 2.0  4. Acute hypoxemic respiratory failure: In setting of cardiac arrest.  H/o COPD.  Bibasilar infiltrates, possible HCAP.  Now with tracheostomy. Diflucan with candida in BAL and blood culture.  Remains on vent via trach, weaning has not progressed much. Chest CT 7/23 w/ concerns for ?developing empyema.  - CCM following and have discussed further CT drainage with TCTS but have deferred  5. Acute blood loss anemia: Now s/p mediastinal re-exploration x 3. Hgb 7.5 today.  - Monitor H/H. 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  Platelets have recovered. Now off bivalirudin and on Eliquis.  7. ID: Suspect HCAP initially, on meropenem/anidulafungin/vanco initially with ongoing fevers and leukocytosis. Venous dopplers negative for DVT. Possible drug fever from meropenem, he was switched to Zosyn 7/14 - 7/21.  He had abdominal US with gallbladder sludge, no definite cholecystitis.  HIDA scan done, GB does not opacify => ?acalculous cholecystitis => now with cholecystostomy tube.  Most recently, Candida in blood culture and BAL, he is on Diflucan. Exam by ophthalmology => no candidal endophthalmitis.  Foley and PICC removed 7/21.  He is now afebrile.  - Continue  fluconazole until 2 wks from 7/27, continue vancomycin 2 wks.   - Chest CT w/ concerns for ?developing empyema. CCM and TCTS following, would consider drainage if recurrent fevers.  8. Elevated bilirubin: Mostly direct = likely cholestatic/shock liver/RV failure predominantly.   Abdominal US with evidence for cirrhosis, sludge in GB but no definite acute cholecystitis. NH3 was not significantly elevated. Tbili 7.5. HIDA scan showed patent common bile duct, so not obstructive stone causing elevated bilirubin.  9. Ileus: Improved.  Getting TFs. S/p PEG tube placement 7/28 10. Atrial fibrillation/flutter: Paroxysmal. In NSR today.     - Continue PO amio 200 daily.  - Eliquis 5 mg bid 11. Neuro: CT head negative on 7/11. EEG with e/o diffuse encephalopathy.  - seems to be improving. Following commands  12. Hypernatremia: Na 146 today  - Continue free water boluses   CRITICAL CARE Performed by: Loralie Champagne  Total critical care time: 35 minutes  Critical care time was exclusive of separately billable procedures and treating other patients.  Critical care was necessary to treat or prevent imminent or life-threatening deterioration.  Critical care was time spent personally by me on the following activities: development of treatment plan with patient and/or surrogate as well as nursing, discussions with consultants, evaluation of patient's response to treatment, examination of patient, obtaining history from patient or surrogate, ordering and performing treatments and interventions, ordering and review of laboratory studies, ordering and review of radiographic studies, pulse oximetry and re-evaluation of patient's condition.   Loralie Champagne 09/06/2019 7:19 AM

## 2019-09-06 NOTE — Progress Notes (Signed)
Physical Therapy Wound Treatment Patient Details  Name: Todd [name] MRN: [id number] Date of Birth: 12/01/1955  Today's Date: 09/06/2019 Time: 1148-1221 Time Calculation (min): 33 min  Subjective  Subjective: unable (vent/trach), smiling intermittently Patient and Family Stated Goals: unable Date of Onset:  (unknown) Prior Treatments: foam dressing  Pain Score: 0/10  Wound Assessment  Pressure Injury 08/09/19 Sacrum Right Unstageable - Full thickness tissue loss in which the base of the injury is covered by slough (yellow, tan, gray, green or brown) and/or eschar (tan, brown or black) in the wound bed. has evolved into unstageable whe (Active)  Dressing Type ABD;Barrier Film (skin prep);Gauze (Comment);Moist to moist 09/06/19 1245  Dressing Changed;Clean;Dry;Intact 09/06/19 1245  Dressing Change Frequency Daily 09/06/19 1245  State of Healing Eschar 09/06/19 1245  Site / Wound Assessment Brown;Yellow;Bleeding 09/06/19 1245  % Wound base Red or Granulating 0% 09/06/19 1245  % Wound base Yellow/Fibrinous Exudate 100% 09/06/19 1245  % Wound base Black/Eschar 0% 09/06/19 1245  % Wound base Other/Granulation Tissue (Comment) 0% 09/06/19 1245  Peri-wound Assessment Erythema (blanchable) 09/06/19 1245  Wound Length (cm) 8.6 cm 09/04/19 0946  Wound Width (cm) 8.7 cm 09/04/19 0946  Wound Depth (cm) 0 cm 08/29/19 0942  Wound Surface Area (cm^2) 74.82 cm^2 09/04/19 0946  Wound Volume (cm^3) 0 cm^3 08/29/19 0942  Margins Unattached edges (unapproximated) 09/06/19 1245  Drainage Amount Minimal 09/06/19 1245  Drainage Description Serosanguineous 09/06/19 1245  Treatment Debridement (Selective);Hydrotherapy (Pulse lavage);Packing (Saline gauze) 09/06/19 1245   Santyl applied to wound bed prior to applying dressing.     Hydrotherapy Pulsed lavage therapy - wound location: sacrum Pulsed Lavage with Suction (psi): 12 psi Pulsed Lavage with Suction - Normal Saline Used: 1000 mL Pulsed Lavage  Tip: Tip with splash shield Selective Debridement Selective Debridement - Location: sacrum Selective Debridement - Tools Used: Scalpel Selective Debridement - Tissue Removed: yellow/gray slough   Wound Assessment and Plan  Wound Therapy - Assess/Plan/Recommendations Wound Therapy - Clinical Statement: Eschar softening, wound bleeding easily at attempts at sharp debridement. [name] from hydotherapy (in conjunction with enzymatic debridement) to remove necrotic tissue to promote wound healing.  Wound Therapy - Functional Problem List: limited positioning options due to pressure wound Factors Delaying/Impairing Wound Healing: Diabetes Mellitus;Incontinence;Immobility;Multiple medical problems Hydrotherapy Plan: Debridement;Dressing change;Patient/family education;Pulsatile lavage with suction Wound Therapy - Frequency: 6X / week Wound Therapy - Follow Up Recommendations: Other (comment) (LTACH) Wound Plan: see above  Wound Therapy Goals- Improve the function of patient's integumentary system by progressing the wound(s) through the phases of wound healing (inflammation - proliferation - remodeling) by: Decrease Necrotic Tissue to: 50% Decrease Necrotic Tissue - Progress: Progressing toward goal Increase Granulation Tissue to: 50 Increase Granulation Tissue - Progress: Progressing toward goal Time For Goal Achievement: 7 days Wound Therapy - Potential for Goals: Fair  Goals will be updated until maximal potential achieved or discharge criteria met.  Discharge criteria: when goals achieved, discharge from hospital, MD decision/surgical intervention, no progress towards goals, refusal/missing three consecutive treatments without notification or medical reason.  GP       Brown, PT, DPT Acute Rehabilitation Services Pager [phone] Office [phone]   Carloine T Brown 09/06/2019, 12:50 PM   

## 2019-09-06 NOTE — Progress Notes (Signed)
Pharmacy Antibiotic Note  Todd [name] is a 64 y.o. male admitted on 07/26/2019 with CHF exacerbation. 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 VT arrest on 6/25 and underwent [facility] ECMO. Decannulated on 6/28 and Impella placed. Chest closed on 7/2 and Imella removed 7/9. Cholecystostomy tube placed on 7/16.  Was on antibiotics initially for possible HCAP. No definite cholecystitis was seen on abd [place]. Now with candida in blood cultures and BAL. No growth from aspirated bile from perc-chole drain. PICC line and CL changed 7/21.  Pharmacy has been consulted for fluconazole for candidemia and add back vancomycin for wound. Cr stable.  Plan: -Continue vancomycin to 724m q24h -Continue fluconazole [id number] -Stop dates entered by ID team -Consider recheck vancomycin trouch ~8/2   Height: _0  (175.3 cm) Weight: 69.2 kg (152 lb 8.9 oz) IBW/kg (Calculated) : 70.7  Temp (24hrs), Avg:98.3 F (36.8 C), Min:97.9 F (36.6 C), Max:98.5 F (36.9 C)  Recent Labs  Lab 09/02/19 0248 09/02/19 0249 09/03/19 0245 09/03/19 1311 09/04/19 0049 09/05/19 0040 09/06/19 0200  WBC 19.2*  --  17.2*  --  17.3* 14.8* 16.5*  CREATININE  --    < > 1.29* 1.30* 1.16 1.12 0.98  VANCOTROUGH  --   --   --  22*  --   --   --    < > = values in this interval not displayed.    Estimated Creatinine Clearance: 73.6 mL/min (by C-G formula based on SCr of 0.98 mg/dL).    Allergies  Allergen Reactions  . Empagliflozin     Other reaction(s): diarrhea  . 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 Other     Other reaction(s): Unknown  . Simvastatin     Other reaction(s): diarrhea  . Sitagliptin     Other reaction(s): diarrhea/abd pain    Antimicrobials this admission: Zosyn 7/14>> 7/21 per ID Fluconazole 7/17>> (8/10) Cefuroxime 6/23>>6/25 Vancomycin 6/24>6/24;  6/26>>7/2, 7/5>>7/13 7/22>>(8/5) Meropenem 6/26>>7/4, 7/5>>7/14, 7/22>>7/26 [name] 7/7>>7/14  Dose  adjustments thisdosing regimen: 6/30 VR 33, 7/1 22 (ke 0.026, T1/2 26.6) - change to vancomycin 1750 mg q36 7/7 VT = 19 (drawn prior to 3rd dose) - reduce to 1g IV every 24 hours 7/11 VT 21 - reduce to 750 q 24, retime for later tonight>start 7/12 AM  Microbiology results: 7/3+7/5 TA - few yeast> few candida topicalis  7/5 + 7/7 Bcx: neg 7/7 Ucx: neg 7/9 Wound Cx: neg 7/12 BAL: candida albicans, candida tropicalis 7/14 pleural fluid: rare candida parapsilosis 7/14 Blood>>>candida parapsilosis  7/16 abscess: ngF 7/17 BCx: ngtd 7/22 Bcx: ngtd  7/22 TA: no orgs seen >> reincubating  MiArrie SenatePharmD, BCPS Clinical Pharmacist [phone] check AMION for all [facility]bers 09/06/2019

## 2019-09-06 NOTE — Progress Notes (Signed)
Pt continues to have increasing activity, requiring mitts overnight. Minimal sleep overnight per nightshift RN; d/w CTS during morning rounds > will add trazodone.   Wife called and updated; all questions answered. She is concerned that he is missing a brown four point cane (she endorses taking wallet and clothes home). Will f/u with 4E.  Hydrotherapy tolerated well.  Clarification requested on staple removal time  FMS with decreased output. D/w team during rounds if it should be kept and bowel regimen started 2/2 sacral wound or removed for pt comfort; will remove.  Tolerated OT session well (see note)  Wife called and updated on afternoon events; all questions answered.   FMS removed; wound found in rectum (assessed and documented).

## 2019-09-07 ENCOUNTER — Inpatient Hospital Stay (HOSPITAL_COMMUNITY): Payer: Medicare Other

## 2019-09-07 DIAGNOSIS — J9621 Acute and chronic respiratory failure with hypoxia: Secondary | ICD-10-CM | POA: Diagnosis not present

## 2019-09-07 DIAGNOSIS — B377 Candidal sepsis: Secondary | ICD-10-CM | POA: Diagnosis not present

## 2019-09-07 LAB — CBC
HCT: 27.2 % — ABNORMAL LOW (ref 39.0–52.0)
Hemoglobin: 7.7 g/dL — ABNORMAL LOW (ref 13.0–17.0)
MCH: 30.9 pg (ref 26.0–34.0)
MCHC: 28.3 g/dL — ABNORMAL LOW (ref 30.0–36.0)
MCV: 109.2 fL — ABNORMAL HIGH (ref 80.0–100.0)
Platelets: 257 10*3/uL (ref 150–400)
RBC: 2.49 MIL/uL — ABNORMAL LOW (ref 4.22–5.81)
RDW: 22.2 % — ABNORMAL HIGH (ref 11.5–15.5)
WBC: 13.8 10*3/uL — ABNORMAL HIGH (ref 4.0–10.5)
nRBC: 0 % (ref 0.0–0.2)

## 2019-09-07 LAB — COMPREHENSIVE METABOLIC PANEL
ALT: 107 U/L — ABNORMAL HIGH (ref 0–44)
AST: 104 U/L — ABNORMAL HIGH (ref 15–41)
Albumin: 1.6 g/dL — ABNORMAL LOW (ref 3.5–5.0)
Alkaline Phosphatase: 381 U/L — ABNORMAL HIGH (ref 38–126)
Anion gap: 8 (ref 5–15)
BUN: 76 mg/dL — ABNORMAL HIGH (ref 8–23)
CO2: 23 mmol/L (ref 22–32)
Calcium: 8.2 mg/dL — ABNORMAL LOW (ref 8.9–10.3)
Chloride: 116 mmol/L — ABNORMAL HIGH (ref 98–111)
Creatinine, Ser: 0.99 mg/dL (ref 0.61–1.24)
GFR calc Af Amer: >60 mL/min (ref >60)
GFR calc non Af Amer: >60 mL/min (ref >60)
Glucose, Bld: 199 mg/dL — ABNORMAL HIGH (ref 70–99)
Potassium: 4.1 mmol/L (ref 3.5–5.1)
Sodium: 147 mmol/L — ABNORMAL HIGH (ref 135–145)
Total Bilirubin: 6.8 mg/dL — ABNORMAL HIGH (ref 0.3–1.2)
Total Protein: 5.7 g/dL — ABNORMAL LOW (ref 6.5–8.1)

## 2019-09-07 LAB — GLUCOSE, CAPILLARY
Glucose-Capillary: 153 mg/dL — ABNORMAL HIGH (ref 70–99)
Glucose-Capillary: 157 mg/dL — ABNORMAL HIGH (ref 70–99)
Glucose-Capillary: 161 mg/dL — ABNORMAL HIGH (ref 70–99)
Glucose-Capillary: 168 mg/dL — ABNORMAL HIGH (ref 70–99)
Glucose-Capillary: 193 mg/dL — ABNORMAL HIGH (ref 70–99)
Glucose-Capillary: 209 mg/dL — ABNORMAL HIGH (ref 70–99)

## 2019-09-07 MED ORDER — FREE WATER
200.0000 mL | Status: DC
  Administered 2019-09-07 – 2019-09-12 (×60): 200 mL

## 2019-09-07 MED ORDER — MIDODRINE HCL 5 MG PO TABS
5.0000 mg | ORAL_TABLET | Freq: Three times a day (TID) | ORAL | Status: DC
  Administered 2019-09-07 – 2019-10-09 (×98): 5 mg
  Filled 2019-09-07 (×87): qty 1

## 2019-09-07 MED ORDER — FUROSEMIDE 40 MG PO TABS
40.0000 mg | ORAL_TABLET | Freq: Two times a day (BID) | ORAL | Status: DC
  Administered 2019-09-07 – 2019-09-09 (×6): 40 mg
  Filled 2019-09-07 (×7): qty 1

## 2019-09-07 NOTE — Progress Notes (Signed)
Had turned pt down to 10/5 to progress vent and monitored. After ~30 mins pt tv and mv dropped. Increased back to 12/5 and still unable to maintain after monitoring so unfortuantely placed back on 15/5

## 2019-09-07 NOTE — Procedures (Addendum)
Tracheostomy Change Note  Patient Details:   Name: Todd [name] DOB: [birth year] MRN: [id number]    Airway Documentation: Routine trach change per policy. 8 cuffed shiley was replaced with 8 cuffed shiley. Placement confirmed by positive color change on ETCO2 detector, bilateral BS & suctioned trach for moderate amount of thick tan secretions. Assisted with trach change by Daron Offer RRT  Evaluation  O2 sats: stable throughout Complications: No apparent complications Patient did tolerate procedure well. Bilateral Breath Sounds: Rhonchi, Diminished    Jacqulynn Cadet 09/07/2019, 4:57 PM

## 2019-09-07 NOTE — Progress Notes (Addendum)
TCTS DAILY ICU PROGRESS NOTE                   [address] [place]            [place] [id number]          [id number]  35 Days s/p CABG x 4 21 Days Post-Op Procedure(s) (LRB): REMOVAL OF IMPELLA LEFT VENTRICULAR ASSIST DEVICE (N/A) TRANSESOPHAGEAL ECHOCARDIOGRAM (TEE) (N/A)  Total Length of Stay:  LOS: 43 days   Subjective: He is awake this am  Objective: Vital signs in last 24 hours: Temp:  [98.1 F (36.7 C)-99.7 F (37.6 C)] 98.5 F (36.9 C) (07/30 0744) Pulse Rate:  [65-86] 73 (07/30 0716) Cardiac Rhythm: Normal sinus rhythm (07/30 0400) Resp:  [19-30] 23 (07/30 0716) BP: (97-159)/(49-98) 112/52 (07/30 0716) SpO2:  [94 %-100 %] 100 % (07/30 0716) FiO2 (%):  [30 %] 30 % (07/30 0716) Weight:  [69.4 kg] 69.4 kg (07/30 0500)  Filed Weights   09/05/19 0500 09/06/19 0309 09/07/19 0500  Weight: 68.5 kg 69.2 kg 69.4 kg    Weight change: 0.2 kg   Hemodynamic parameters for last 24 hours:    Intake/Output from previous day: 07/29 0701 - 07/30 0700 In: 4169.9 [NG/GT:3360; IV Piggyback:149.9] Out: 1790 [Urine:1340; Drains:120; Chest Tube:330]  Intake/Output this shift: No intake/output data recorded.  Current Meds: Scheduled Meds: . amiodarone  200 mg Per Tube Daily  . apixaban  5 mg Per Tube BID  . aspirin  81 mg Per Tube Daily  . chlorhexidine gluconate (MEDLINE KIT)  15 mL Mouth Rinse BID  . Chlorhexidine Gluconate Cloth  6 each Topical Daily  . cholestyramine  4 g Oral BID  . collagenase   Topical Daily  . feeding supplement (PROSource TF)  45 mL Per Tube Daily  . free water  200 mL Per Tube Q2H  . furosemide  40 mg Per Tube BID  . insulin aspart  0-24 Units Subcutaneous Q4H  . insulin detemir  8 Units Subcutaneous BID  . ipratropium  0.5 mg Nebulization TID BM  . levalbuterol  1.25 mg Nebulization TID BM  . mouth rinse  15 mL Mouth Rinse 10 times per day  . midodrine  5 mg Per Tube TID WC  . nutrition supplement (JUVEN)  1 packet Per Tube BID  BM  . pantoprazole sodium  40 mg Per Tube Daily  . sodium chloride flush  3 mL Intravenous Q12H  . sodium chloride flush  5 mL Intracatheter Q8H  . thiamine injection  100 mg Intravenous Daily   Continuous Infusions: . sodium chloride 250 mL (09/07/19 0247)  . feeding supplement (PIVOT 1.5 CAL) 1,000 mL (09/07/19 0555)  . fluconazole (DIFLUCAN) IV 400 mg (09/07/19 0249)  . vancomycin Stopped (09/06/19 1627)   PRN Meds:.sodium chloride, acetaminophen (TYLENOL) oral liquid 160 mg/5 mL, dextrose, fentaNYL (SUBLIMAZE) injection, Gerhardt's butt cream, lip balm, metoprolol tartrate, ondansetron (ZOFRAN) IV, traZODone  General appearance: awake this am Neurologic: he is looking around the room this am, able to follow some commands Heart: RRR Lungs: Audible rhonchi (upper airway sounds) Abdomen: Soft, non tender, bowel sounds present Extremities: Boots in place LEs.  Wounds: Clean and dry  Lab Results: CBC: Recent Labs    09/06/19 0200 09/07/19 0037  WBC 16.5* 13.8*  HGB 7.5* 7.7*  HCT 26.4* 27.2*  PLT 282 257   BMET:  Recent Labs    09/06/19 0200 09/07/19 0037  NA 146* 147*  K 4.1 4.1  CL 114* 116*  CO2 21* 23  GLUCOSE 197* 199*  BUN 77* 76*  CREATININE 0.98 0.99  CALCIUM 8.3* 8.2*    CMET: Lab Results  Component Value Date   WBC 13.8 (H) 09/07/2019   HGB 7.7 (L) 09/07/2019   HCT 27.2 (L) 09/07/2019   PLT 257 09/07/2019   GLUCOSE 199 (H) 09/07/2019   CHOL 203 (H) 07/27/2019   TRIG 213 (H) 08/27/2019   HDL 34 (L) 07/27/2019   LDLCALC 148 (H) 07/27/2019   ALT 107 (H) 09/07/2019   AST 104 (H) 09/07/2019   NA 147 (H) 09/07/2019   K 4.1 09/07/2019   CL 116 (H) 09/07/2019   CREATININE 0.99 09/07/2019   BUN 76 (H) 09/07/2019   CO2 23 09/07/2019   TSH 2.243 07/31/2019   INR 1.4 (H) 09/04/2019   HGBA1C 9.0 (H) 07/26/2019    PT/INR:  No results for input(s): LABPROT, INR in the last 72 hours. Radiology: DG CHEST PORT 1 VIEW  Result Date: 09/07/2019 CLINICAL  DATA:  Tracheostomy.  Pleural effusion. EXAM: PORTABLE CHEST 1 VIEW COMPARISON:  09/05/2019. FINDINGS: Tracheostomy tube in stable position. Bilateral chest tubes in stable position. Prior CABG. Cardiomegaly. Diffuse bilateral pulmonary infiltrates/edema again noted without interim change. Small bilateral pleural effusions again noted without interim change. No pneumothorax. IMPRESSION: 1. Tracheostomy tube and bilateral chest tubes in stable position. No pneumothorax. 2.  Prior CABG.  Stable cardiomegaly. 3. Diffuse bilateral pulmonary infiltrates/edema again noted. Small bilateral pleural effusions again noted. No interim change. Electronically Signed   By: Marcello Moores  Register   On: 09/07/2019 07:02     Assessment/Plan: S/P Procedure(s) (LRB): REMOVAL OF IMPELLA LEFT VENTRICULAR ASSIST DEVICE (N/A) TRANSESOPHAGEAL ECHOCARDIOGRAM (TEE) (N/A)   1. CV-Previous paroxysmal a fib/flutter, SR with HR in the 70's.On Amiodarone 200 mg daily per tube, Apixaban 5 mg bid, Midodrine 5 mg tid 2. Pulmonary-VDRF, history of COPD. S/p trach and on vent via trach collar. Chest tube with 330 cc last 24 hours (for pleural effusion). Continue inhalers.  3. GI-NPO,tolerating tube feedings. S/p gastrostomy tube. Has cholecystostomy tube as well 4. ID- on Fluconazole and Vancomycin for possible empyema. Also, blood cultures 07/14 showed Candida Parapsilosis, which he was treated for. WBC this am slightly decreased to 13,800 5. Volume overload-on Lasix 40 daily per tube 6. Post op blood loss anemia-H and H this am stable at  7.7 and 27.2 7. CBGs 168/153/168. On Insulin. 8. Elevated transaminases, bili likely secondary to shock. T bili decreased to 6.8 (19 at its highest), AST decreased to 104 and ALT 107 , alk phos slightly increased 381 9. Decubitus ulcer-wound care team following 10. Will remove every other staples 11. Will need LTAC   Donielle Liston Alba PA-C 09/07/2019 8:06 AM    Chart reviewed, patient examined,  agree with above. He is awake and alert, following commands and interactive. Chest tubes need to stay in while draining.  Vent wean per CCM. LFT's continue to improve slowly. Albumin only 1.6 but tolerating TF at goal. Diuretic increased by HF team since wt is increasing. Edema is mild. Creat nl but BUN remains in 70's.

## 2019-09-07 NOTE — Progress Notes (Signed)
TCTS Evening Rounds  Stable day hemodynamically BP (!) 112/64   Pulse 79   Temp 100.2 F (37.9 C) (Oral)   Resp (!) 27   Ht 5\' 9"  (1.753 m)   Wt 69.4 kg   SpO2 100%   BMI 22.59 kg/m    PE: on ventilator Neuro intact RRR CTA Ext: mild edema   Date 09/07/19 0701 - 09/08/19 0700  Shift 0701-1900 1901-0700 24 Hour Total  INTAKE  NG/GT 240  240    Free Water Volume 240  240  IV Piggyback 0  0    Volume (mL) (fluconazole (DIFLUCAN) IVPB 400 mg) 0  0  Shift Total(mL/kg) 240(3.5)  240(3.5)  OUTPUT  Urine(mL/kg/hr) 400  400    Output (mL) (External Urinary Catheter) 400  400  Shift Total(mL/kg) 400(5.8)  400(5.8)  NET -160  -160  Weight (kg) 69.4 69.4 69.4    A/p: continue slow vent wean, supportive care. Istvan Behar Z. 09/10/19, MD [phone]

## 2019-09-07 NOTE — Progress Notes (Signed)
NAME:  Todd [name], MRN:  [id number], DOB:  10/23/1955, LOS: 1 ADMISSION DATE:  07/26/2019, CONSULTATION DATE:  08/03/19 REFERRING MD:  Roxan Hockey, CHIEF COMPLAINT:  ECMO   Brief History   64 yo male admitted with CHF exacerbation, found to have multi-vessel disease requiring CABG 6/24, post op VT Arrest 6/25 and cannulated for VA ECMO.    History of present illness   Presented with worsening dyspnea 6/17 c/w CHF exacerbation.  LHC  with severe triple vessel disease.  Underwent CABG 6/24.  Vtach arrest 6/25.  Chest opened bedside and cardiac massage initiated as well as multiple cardioversions, amiodarone, bicarb etc.  Brought to OR and cannulated for VA ECMO.  PCCM consulted to assist with management  Comorbidities include DM, heavy smoking, COPD  Past Medical History  Depression Ischemic cardiomyopathy HTN HLD  Significant Hospital Events   6/24 CABG 6/25 VA cannulation 6/28 decannulated and impella placed 6/29 bedside re-exploration; s/p decannulation. Placement of impella device originally at p8 but decreased to p4 2/2 suction events overnight up to p6. Echo completed and repositioned device. Remained on considerable support with 8 epi and 46 norepi vaso 0.05. continued chest tube output with large clots noted. Heparin thru device. Replacement products ongoing. 60% 8 peep 6/30 bedside mediastinal re-exploration and clean out of hematoma with tamponade and worsening hemodynamics. Pt had large volume transfusion. rebolused with amio 2/2 nsvt episode.  Weight up 48 pounds.  Started diuresis, Lasix drip started. 7/01 iatrogenic respiratory alkalosis, vent rate decreased. 7/02 No significant issues overnight remains on pressors and Impella not tolerating tube feeds with high gastric output awaiting core track placement 7/03 started trickle TF  7/05 Swab removed, CVL and PICC placed. 7/13 Trach and BAL 7/16 Awake this AM and tracking. No follow commands. Still having fevers. Successful  ultrasound and CT guided placement of a 10.2 French cholecystostomy tube. A small amount of aspirated bile was capped and sent to the laboratory for analysis 7/17 Off sedation gtt. doing pressure support ventilation.  Beginning to follow some commands occasionally intermittently.  Very jaundiced.  Growing Candida in the blood and BAL.  Still febrile but T-max is coming down.  WBC persistently elevated; currently at 19.4 okay.  Diflucan started. Currently on milrinone GTT, amiodarone gtt., vasopressin gtt., Levophed gtt. and bivalrudin gtt. Off epi gtt. Cards planning lasix x 1 today. 7/21 Ongoing fevers, WBC increased 7/22: Persistent fevers, pan cultured with CT Chest/Head. Suspected source tracheobronchitis.   Consults:  Advanced heart failure, PCCM, TCTS  Procedures:  LUE PICC 7/5 >>7/21 R Brachial ALine 7/9 >>7/27 Chest Tubes >> L IJ CVC 7/21 >>7/27 Peg tube 7/27  Significant Diagnostic Tests:  6/22 spirometry with restrictive physiology, preserved FEV1/FVC 6/18 echo: LVEF 34-74%, grade I diastolic dysfunction 2/59 US Abdomen cholelithiasis without cholecystitis. Mild wall thinking in setting of liver disease and ascites.  7/10: LE [place] negative for DVT  7/20 CT ABD w contrast >> stable position of the percutaneous cholecystostomy tube with complete decompression of the gallbladder, trace residual free fluid within the RUQ, decreased since prior, trace bilateral pleural effusions L>R, scattered areas of ground glass airspace disease within the RML, RLL 7/22: CT Chest w/o Contrast: Retrosternal fluid collection, non specific.  Bilateral pleural effusions, portions loculated.   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 Chronic microvascular angiopathy. Stable bilateral mastoid and middle ear effusions.   Micro Data:  COVID and HIV 6/17 >> Neg 7/3 respiratory >> few Candida tropicalis  7/5 blood >> negative  7/5 respiratory >> few Candida  tropicalis 7/7 urine >> negative 7/7 blood >> negative  7/9 wound >> negative 7/12 BAL >> candida tropicalis, candida albicans 7/14 BCID >> candida parapsilosis   7/14 BCx2 >> candida parapsilosis  7/17 BCx2 >> NGTD 7/21 BCx2 >> NGTD 7/22 Respiratory Cx >> candida 7/24 c diff neg  Antimicrobials:  Vanc 6/24>>7/13 Cefuroxime 6/24 >> 6/25 merrem 6/26 >> 7/14 Anidulafungin 7/8 >> 7/14 Zosyn 7/14 >>7/21 Diflucan 7/17 >> Vanco 7/22 >> Mero 7/22 >>  Interim history/subjective:  7/30: no events overnight, began trazodone last pm which appears to have helped with restlessness overnight. Sodium is increasing. Pt is calm and alert this am. On 15/5 which is up from yesterday. Turned to 10/5 and adequate tv. Will cont to follow.  7/29: pt with agitation/restlessness overnight. Trazodone added by CTS. Pt is alert and pleasant this am. Appears comfortable on decreased ps settings 12/5 will cont to decrease. HF weaning midodrine today and attempting diuresis. Wbc is up somewhat but afebrile 7/28: T max 99.8 last 24 hours WBC 17.3 ->14.8 Transitioned to eliquis Copious secretions continue Remains Jaundiced but bili and alk phos improving    Objective   Blood pressure (!) 112/52, pulse 73, temperature 98.5 F (36.9 C), temperature source Oral, resp. rate 23, height _0  (1.753 m), weight 69.4 kg, SpO2 100 %.    Vent Mode: CPAP;PSV FiO2 (%):  [30 %] 30 % Set Rate:  [20 bmp] 20 bmp Vt Set:  [420 mL] 420 mL PEEP:  [5 cmH20] 5 cmH20 Pressure Support:  [15 cmH20] 15 cmH20 Plateau Pressure:  [11 cmH20-16 cmH20] 11 cmH20   Intake/Output Summary (Last 24 hours) at 09/07/2019 0748 Last data filed at 09/07/2019 0700 Gross per 24 hour  Intake 4169.9 ml  Output 1790 ml  Net 2379.9 ml   Filed Weights   09/05/19 0500 09/06/19 0309 09/07/19 0500  Weight: 68.5 kg 69.2 kg 69.4 kg   Exam General: Adult male with trach and mechanical ventilator , appears chronically ill, deconditioned but  comfortable on ps trial, awake and interavtive HEENT: NCAT, MM dry, old blood noted, trach midline #8 Neuro -awake follows one-step commands, grossly weak but improving in strength CV: s1s2 RRR, SR on monitor, no m/r/g, midline sternal wound with staples, chest tubes in place, serous drainage PULM: Bilateral chest excursion,diminished throughout, chest tube in place, no increased wob GI: soft, bsx4 active, peg tube in place with improved tenderness but no rebound or guarding Extremities: warm/dry, no edema , brisk cap refill, clubbing noted, ischemic changes on R finger tip Skin: no rashes or lesions on exposed skin. Sternal wound with staples,  Sacral decubitus approximately 3x3 inch site, Appears jaundiced but improving  Lab Results  Component Value Date   WBC 13.8 (H) 09/07/2019   HGB 7.7 (L) 09/07/2019   HCT 27.2 (L) 09/07/2019   MCV 109.2 (H) 09/07/2019   PLT 257 09/07/2019   Lab Results  Component Value Date   CREATININE 0.99 09/07/2019   BUN 76 (H) 09/07/2019   NA 147 (H) 09/07/2019   K 4.1 09/07/2019   CL 116 (H) 09/07/2019   CO2 23 09/07/2019   Resolved Hospital Problem list     Assessment & Plan:   Critically ill due to cardiogenic/hemorrhagic shock status post CABG complicated by VT arrest,  S/p VA ECMO (decanulated 6/28). S/p impella placement (6/28> 7/9) Vasopressors are off. - lines removed  -HF decreasing midodrine 15m tid  Atrial flutter -  continue amiodarone, ASA, s/p Bivalirudin for HITT -now on eliquis  Critically ill due to acute hypoxic respiratory failure w/ need for mechanical ventilation - status post tracheostomy Poor lung mechanics due to weakness,  -Xopenex -Chest tube to water seal 7/22, continue.  -Continue Chest PT. Improved secretions -Slow wean, tolerating pressure support 10/5 this am.  -not tolerating atc at this time.  -will cont vent wean to that  Deconditioning of Critical Illness  --Continue PT/OT  Hyperbilirubinemia due to  acalculous cholecystitis Possible cirrhosis per 7/8 [place]  Status post  percutaneous GB drain  Remains jaundiced --cont GB drain --downtrending bili and lft: -alk phos uptrending somewhat  T2DM with fluctuant hypo/hyperglycemia --euglycemic - s/p peg placement 7.27 back on tf  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 --continue diflucan, will need 2 weeks antifungal coverage --Ophthamology has evaluated this admission --All lines out  Acute blood loss anemia and critical illness anemia Heparin-induced thrombocytopenia>> resolved --Bivalirudin stopped and started eliquis --Hgb 7.7 , no evidence of bleeding - trend CBC - Monitor for bleeding - transfuse as needed for HGB < 7  Acute kidney injury:  -improving -follow indices and uop Hypernatremia: -increasing to 147 today.  -cont fwf but increase   Acute encephalopathy  --CTH without acute intracranial pathology --Minimize sedating medications --Delirium prevention measures --EEG neg 7/23  --cont thiamine  Sacral Decubitus  --Wound care appreciated  Small left adrenal mass seen on CT scan of the abdomen August 24, 2019  Fever ,suspected HAP downtrending fever curve, and wbc at this time -cont to monitor.  -no effusions on chest imaging (cxr) -if wbc worsens may benefit from repeat ct to further delineate need for intervention of potentially loculated effusion seen on 7/23 but not present or overt on cxr.   Daily Goals Checklist  Pain/Anxiety/Delirium protocol (if indicated): fentanyl as needed only VAP protocol (if indicated): bundle in place.  DVT prophylaxis: eliquis Nutrition Status: tf GI prophylaxis: PPI Urinary catheter: n/a Central lines: removed Glucose control: See above Mobility/therapy needs: PT for ROM Code Status: Full  Family Communication: per primary Disposition: ICU, Anticipate LTACH need.   Critical care time: The patient is critically ill with multiple organ systems failure and  requires high complexity decision making for assessment and support, frequent evaluation and titration of therapies, application of advanced monitoring technologies and extensive interpretation of multiple databases.  Critical care time 38 mins. This represents my time independent of the NPs time taking care of the pt. This is excluding procedures.    [facility] 09/07/2019, 7:48 AM

## 2019-09-07 NOTE — Progress Notes (Signed)
Physical Therapy Wound Treatment Patient Details  Name: Todd [name] MRN: [id number] Date of Birth: 1955-07-02  Today's Date: 09/07/2019 Time: 1200-1235 Time Calculation (min): 35 min  Subjective  Subjective: unable (vent/trach), smiling intermittently Patient and Family Stated Goals: unable Date of Onset:  (unknown) Prior Treatments: foam dressing  Pain Score:  6/10; premedicated  Wound Assessment  Pressure Injury 08/09/19 Sacrum Right Unstageable - Full thickness tissue loss in which the base of the injury is covered by slough (yellow, tan, gray, green or brown) and/or eschar (tan, brown or black) in the wound bed. has evolved into unstageable whe (Active)  Dressing Type ABD;Barrier Film (skin prep);Moist to moist 09/07/19 1258  Dressing Changed;Clean;Dry;Intact 09/07/19 1258  Dressing Change Frequency Daily 09/07/19 1258  State of Healing Eschar 09/07/19 1258  Site / Wound Assessment Brown;Yellow;Bleeding 09/07/19 1258  % Wound base Red or Granulating 10% 09/07/19 1258  % Wound base Yellow/Fibrinous Exudate 90% 09/07/19 1258  % Wound base Black/Eschar 0% 09/07/19 1258  % Wound base Other/Granulation Tissue (Comment) 0% 09/07/19 1258  Peri-wound Assessment Erythema (blanchable) 09/07/19 1258  Wound Length (cm) 8.6 cm 09/07/19 1258  Wound Width (cm) 8.7 cm 09/07/19 1258  Wound Depth (cm) 0.1 cm 09/07/19 1258  Wound Surface Area (cm^2) 74.82 cm^2 09/07/19 1258  Wound Volume (cm^3) 7.48 cm^3 09/07/19 1258  Margins Unattached edges (unapproximated) 09/07/19 1258  Drainage Amount Minimal 09/07/19 1258  Drainage Description Sanguineous 09/07/19 1258  Treatment Debridement (Selective);Hydrotherapy (Pulse lavage);Packing (Saline gauze) 09/07/19 1258     Hydrotherapy Pulsed lavage therapy - wound location: sacrum Pulsed Lavage with Suction (psi): 12 psi Pulsed Lavage with Suction - Normal Saline Used: 1000 mL Pulsed Lavage Tip: Tip with splash shield Selective  Debridement Selective Debridement - Location: sacrum Selective Debridement - Tools Used: Forceps;Scissors Selective Debridement - Tissue Removed: yellow/gray slough   Wound Assessment and Plan  Wound Therapy - Assess/Plan/Recommendations Wound Therapy - Clinical Statement: Top layer of eschar remains adherent, continued removal of yellow/gray slought, wound bleeding easily at attempts at sharp debridement. [name] from hydotherapy (in conjunction with enzymatic debridement) to remove necrotic tissue to promote wound healing.  Wound Therapy - Functional Problem List: limited positioning options due to pressure wound Factors Delaying/Impairing Wound Healing: Diabetes Mellitus;Incontinence;Immobility;Multiple medical problems Hydrotherapy Plan: Debridement;Dressing change;Patient/family education;Pulsatile lavage with suction Wound Therapy - Frequency: 6X / week Wound Therapy - Follow Up Recommendations: Other (comment) (LTACH) Wound Plan: see above  Wound Therapy Goals- Improve the function of patient's integumentary system by progressing the wound(s) through the phases of wound healing (inflammation - proliferation - remodeling) by: Decrease Necrotic Tissue to: 50% Decrease Necrotic Tissue - Progress: Progressing toward goal Increase Granulation Tissue to: 50 Increase Granulation Tissue - Progress: Progressing toward goal Time For Goal Achievement: 7 days Wound Therapy - Potential for Goals: Fair  Goals will be updated until maximal potential achieved or discharge criteria met.  Discharge criteria: when goals achieved, discharge from hospital, MD decision/surgical intervention, no progress towards goals, refusal/missing three consecutive treatments without notification or medical reason.  GP      Wyona Almas, PT, DPT Acute Rehabilitation Services Pager [phone] Office [phone]   Deno Etienne 09/07/2019, 1:04 PM

## 2019-09-07 NOTE — TOC Benefit Eligibility Note (Signed)
Transition of Care [facility]) Benefit Eligibility Note    Patient Details  Name: MORTIMER BAIR MRN: [id number] Date of Birth: February 13, 1955   Medication/Dose: Eliquis 5 mg bid  Covered?: Yes     Prescription Coverage Preferred Pharmacy: [facility]  Spoke with Person/Company/Phone Number:: [employer]  Co-Pay: $142 for 30 day retail  Prior Approval: No          Orson Aloe Phone Number: 09/07/2019, 12:11 PM

## 2019-09-07 NOTE — Progress Notes (Addendum)
Physical Therapy Treatment Patient Details Name: Todd [name] MRN: [id number] DOB: Aug 01, 1955 Today's Date: 09/07/2019    History of Present Illness Todd [name] is a 64 y.o. male presenting with shortness of breath; noted AKI, CHF, recent PNA, elevated troponin, pulm edema/effusion, and cardiomegaly.Marland Kitchen CABG 6/24 and intubated (extubated 6/25)--went into cardiac arrest on same date with re-opening of chest for heart massage and direct epicardial paddles and place on ECMO. 08/03/19 washout out for tamponade. 7/2 sternal closure and wound vac. 7/9 Impella removed.08/20/19 Tracheostomy 08/24/19 cholecystostomy tube. 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 PMHx: HFrEF, COPD, HTN, T2DM, HLD, MDD, chronic back pain, vitamin deficiency, BPH, tobacco use disorder, marijuana use disorder    PT Comments    Pt alert with improved cognition and responses today. Pt with appropriate head nods/shakes today and mouthing responses to questions. Pt unable to attempt to write when given pen and paper for increased communication and continue to question how much his hearing is impacting command following. Pt attempting to assist at times with transfers but requires max +2 assist for all mobility and unable to significantly assist with HEP despite multimodal cues. Pt would benefit from KREg tilt bed for increased standing and mobility given current function and precautions.   Vent on CPAP with FiO2 30%, SpO2 100% 115/57 (73) prior to mobility, 107/52 (67) after standing HR 79-88    Follow Up Recommendations  LTACH     Equipment Recommendations  None recommended by PT    Recommendations for Other Services       Precautions / Restrictions Precautions Precautions: Fall;Sternal;Other (comment) Precaution Comments: vent, male pure wick, chole drain, left chest tube, sacral wound, trach    Mobility  Bed Mobility Overal bed mobility: Needs Assistance Bed Mobility: Rolling;Supine  to Sit;Sit to Supine Rolling: Max assist   Supine to sit: Total assist;+2 for physical assistance Sit to supine: Total assist;+2 for physical assistance   General bed mobility comments: Used chair position of bed to achieve full sitting and return to supine. Pt reaching for bil bedrail to attempt to adjust self in bed with cues not to pull, In sitting leaning and reaching to left stating "my butt" to off load sacrum. Total +2 assist to slide toward HOB. Max assist to roll bil for pericare and linen change  Transfers Overall transfer level: Needs assistance   Transfers: Sit to/from Stand Sit to Stand: Max assist;+2 physical assistance         General transfer comment: max +2 with bil knees blocked and pad cradling sacrum x 2 attempts. pt engaging quads but maintains crouched posture and unable to extend hips and trunk. Max assist to scoot to EOB  Ambulation/Gait                 Stairs             Wheelchair Mobility    Modified Rankin (Stroke Patients Only)       Balance Overall balance assessment: Needs assistance   Sitting balance-Leahy Scale: Poor Sitting balance - Comments: min-max assist with sitting at EOB in chair egress     Standing balance-Leahy Scale: Zero Standing balance comment: bil trunk and sacral support, knees blocked, dependent on 2 person assist                            Cognition Arousal/Alertness: Awake/alert Behavior During Therapy: Flat affect Overall Cognitive Status: Difficult to assess  Area of Impairment: Following commands;Orientation;Attention;Memory;Safety/judgement                 Orientation Level: Disoriented to;Time;Place;Situation Current Attention Level: Sustained   Following Commands: Follows one step commands inconsistently Safety/Judgement: Decreased awareness of safety;Decreased awareness of deficits   Problem Solving: Slow processing;Decreased initiation;Difficulty sequencing;Requires verbal  cues;Requires tactile cues General Comments: pt with improved command following and orientation today. Pt able to mouth his name but with orientation stated "home" and unaware of situation or year. Pt also mouth "John..." unable to discern the rest and pt unable to write. Pt appropriately nodding and shaking head this session although with delay and continue to question how his hearing is impacting cognition      Exercises General Exercises - Lower Extremity Short Arc Quad: PROM;Both;10 reps;Seated Hip Flexion/Marching: PROM;Both;Seated;10 reps    General Comments        Pertinent Vitals/Pain Pain Assessment: No/denies pain    Home Living                      Prior Function            PT Goals (current goals can now be found in the care plan section) Progress towards PT goals: Not progressing toward goals - comment    Frequency    Min 2X/week      PT Plan Current plan remains appropriate    Co-evaluation              AM-PAC PT "6 Clicks" Mobility   Outcome Measure  Help needed turning from your back to your side while in a flat bed without using bedrails?: Total Help needed moving from lying on your back to sitting on the side of a flat bed without using bedrails?: Total Help needed moving to and from a bed to a chair (including a wheelchair)?: Total Help needed standing up from a chair using your arms (e.g., wheelchair or bedside chair)?: Total Help needed to walk in hospital room?: Total Help needed climbing 3-5 steps with a railing? : Total 6 Click Score: 6    End of Session   Activity Tolerance: Patient tolerated treatment well Patient left: with call bell/phone within reach;in bed;with bed alarm set Nurse Communication: Mobility status;Need for lift equipment PT Visit Diagnosis: Unsteadiness on feet (R26.81);Muscle weakness (generalized) (M62.81);Difficulty in walking, not elsewhere classified (R26.2)     Time: 9675-9163 PT Time  Calculation (min) (ACUTE ONLY): 29 min  Charges:  $Therapeutic Activity: 23-37 mins                     Tomeca Helm P, PT Acute Rehabilitation Services Pager: [phone] Office: [phone]    Noboru Bidinger B Yari Szeliga 09/07/2019, 1:43 PM

## 2019-09-07 NOTE — Progress Notes (Addendum)
Patient ID: Todd [name], male   DOB: May 10, 1955, 64 y.o.   MRN: [id number]     Advanced Heart Failure Rounding Note  PCP-Cardiologist: No primary care provider on file.   Subjective:    08/02/19 CABG 08/03/19 VF arrest. Chest open at bedside 08/03/19 Back to OR for ECMO cannulation 08/03/19 Washout out for tamponade 08/04/19 Repeat bedside washout for tamponade 08/06/19 To OR for decannulation and Impella 5.5 08/07/19 Underwent emergent bedside chest exploration at bedside for tamponade  08/10/19 Chest closed 08/16/19 HIT positive, bivalirudin begun 08/17/19 Impella removed 08/20/19 Tracheostomy 08/23/19 HIDA scan showed patent common bile duct (unable to visualize GB, unable to rule out acalculous cholecystitis).  08/24/19 cholecystostomy tube 08/30/19 chest CT suggestive of possible empyema 08/31/19 ECG with diffuse encephalopathy.  Remains on vent through TC. Respiratory therapy currently at bedside trying to wean.   Afebrile, WBCs down-trending, 16.5>>13.8.   Remains in NSR on tele. HR 70s.  Volume status stable.   ABx/antifungals: Meropenem and [name] stopped 7/14  Zosyn stopped 7/21.   Meropenem restarted 7/22 Candida from BAL and blood culture, started on fluconazole.   Now on fluconazole and vanc. Meropenem discontinued 7/26    Objective:   Weight Range: 69.4 kg Body mass index is 22.59 kg/m.   Vital Signs:   Temp:  [98.1 F (36.7 C)-100.2 F (37.9 C)] 98.1 F (36.7 C) (07/30 0335) Pulse Rate:  [65-86] 68 (07/30 0700) Resp:  [19-30] 21 (07/30 0700) BP: (97-159)/(49-98) 112/52 (07/30 0700) SpO2:  [94 %-100 %] 100 % (07/30 0700) FiO2 (%):  [30 %] 30 % (07/30 0400) Weight:  [69.4 kg] 69.4 kg (07/30 0500) Last BM Date: 09/06/19  Weight change: Filed Weights   09/05/19 0500 09/06/19 0309 09/07/19 0500  Weight: 68.5 kg 69.2 kg 69.4 kg    Intake/Output:   Intake/Output Summary (Last 24 hours) at 09/07/2019 0717 Last data filed at 09/07/2019 0700 Gross per 24 hour    Intake 4169.9 ml  Output 1790 ml  Net 2379.9 ml      Physical Exam    General: weak/frail appearing male. No distress  Neck: No JVD, no thyromegaly or thyroid nodule. + TC Lungs: on vent through TC. CTAB CV: Nondisplaced PMI.  Heart regular S1/S2, no S3/S4, no murmur.  No edema Abdomen: Soft, nontender, no hepatosplenomegaly, no distention.  Skin: Jaundiced.  Neurologic: awake and following commands.  Extremities: No clubbing or cyanosis.  HEENT: Normal.    Telemetry   NSR 70.  No afib  Personally reviewed   Labs    CBC Recent Labs    09/06/19 0200 09/07/19 0037  WBC 16.5* 13.8*  HGB 7.5* 7.7*  HCT 26.4* 27.2*  MCV 108.2* 109.2*  PLT 282 638   Basic Metabolic Panel Recent Labs    09/05/19 0040 09/05/19 0040 09/06/19 0200 09/07/19 0037  NA 146*   < > 146* 147*  K 3.9   < > 4.1 4.1  CL 111   < > 114* 116*  CO2 25   < > 21* 23  GLUCOSE 212*   < > 197* 199*  BUN 79*   < > 77* 76*  CREATININE 1.12   < > 0.98 0.99  CALCIUM 8.2*   < > 8.3* 8.2*  MG 2.4  --   --   --    < > = values in this interval not displayed.   Liver Function Tests Recent Labs    09/05/19 0040 09/07/19 0037  AST 121* 104*  ALT  121* 107*  ALKPHOS 347* 381*  BILITOT 7.4* 6.8*  PROT 5.8* 5.7*  ALBUMIN 1.6* 1.6*   No results for input(s): LIPASE, AMYLASE in the last 72 hours. Cardiac Enzymes No results for input(s): CKTOTAL, CKMB, CKMBINDEX, TROPONINI in the last 72 hours.  BNP: BNP (last 3 results) Recent Labs    07/26/19 1236  BNP 2,568.2*    ProBNP (last 3 results) No results for input(s): PROBNP in the last 8760 hours.   D-Dimer No results for input(s): DDIMER in the last 72 hours. Hemoglobin A1C No results for input(s): HGBA1C in the last 72 hours. Fasting Lipid Panel No results for input(s): CHOL, HDL, LDLCALC, TRIG, CHOLHDL, LDLDIRECT in the last 72 hours. Thyroid Function Tests No results for input(s): TSH, T4TOTAL, T3FREE, THYROIDAB in the last 72  hours.  Invalid input(s): FREET3  Other results:   Imaging    DG CHEST PORT 1 VIEW  Result Date: 09/07/2019 CLINICAL DATA:  Tracheostomy.  Pleural effusion. EXAM: PORTABLE CHEST 1 VIEW COMPARISON:  09/05/2019. FINDINGS: Tracheostomy tube in stable position. Bilateral chest tubes in stable position. Prior CABG. Cardiomegaly. Diffuse bilateral pulmonary infiltrates/edema again noted without interim change. Small bilateral pleural effusions again noted without interim change. No pneumothorax. IMPRESSION: 1. Tracheostomy tube and bilateral chest tubes in stable position. No pneumothorax. 2.  Prior CABG.  Stable cardiomegaly. 3. Diffuse bilateral pulmonary infiltrates/edema again noted. Small bilateral pleural effusions again noted. No interim change. Electronically Signed   By: Marcello Moores  Register   On: 09/07/2019 07:02     Medications:     Scheduled Medications: . amiodarone  200 mg Per Tube Daily  . apixaban  5 mg Per Tube BID  . aspirin  81 mg Per Tube Daily  . chlorhexidine gluconate (MEDLINE KIT)  15 mL Mouth Rinse BID  . Chlorhexidine Gluconate Cloth  6 each Topical Daily  . cholestyramine  4 g Oral BID  . collagenase   Topical Daily  . feeding supplement (PROSource TF)  45 mL Per Tube Daily  . free water  300 mL Per Tube Q4H  . furosemide  40 mg Per Tube Daily  . insulin aspart  0-24 Units Subcutaneous Q4H  . insulin detemir  8 Units Subcutaneous BID  . ipratropium  0.5 mg Nebulization TID BM  . levalbuterol  1.25 mg Nebulization TID BM  . mouth rinse  15 mL Mouth Rinse 10 times per day  . midodrine  10 mg Per Tube TID WC  . nutrition supplement (JUVEN)  1 packet Per Tube BID BM  . pantoprazole sodium  40 mg Per Tube Daily  . sodium chloride flush  3 mL Intravenous Q12H  . sodium chloride flush  5 mL Intracatheter Q8H  . thiamine injection  100 mg Intravenous Daily    Infusions: . sodium chloride 250 mL (09/07/19 0247)  . feeding supplement (PIVOT 1.5 CAL) 1,000 mL  (09/07/19 0555)  . fluconazole (DIFLUCAN) IV 400 mg (09/07/19 0249)  . vancomycin Stopped (09/06/19 1627)    PRN Medications: sodium chloride, acetaminophen (TYLENOL) oral liquid 160 mg/5 mL, dextrose, fentaNYL (SUBLIMAZE) injection, Gerhardt's butt cream, lip balm, metoprolol tartrate, ondansetron (ZOFRAN) IV, traZODone   Assessment/Plan   1. Shock: Mixed cardiogenic/septic.  Echo with EF 20-25%, moderate RV dysfunction.  VA ECMO post-VF arrest, cannulated 6/25.  Stable s/p return to OR for mediastinal re-exploration due to bleeding on 6/25.  Decannulated from [place] ECMO on 08/06/19. Impella 5.5 placed. Underwent emergent chest re-exploration on 6/29 for tamponade and clot  removal. Chest closed 7/2.  Impella 5.5 removed 7/9.  Milrinone stopped 7/23, now off norepinephrine as well. Suspect primarily septic/vasodilatory shock with fever, elevated WBCs. Improved. WBCs trending down and now AF. Volume status ok. - Continue PO Lasix 40 mg daily   - Continue midodrine 10 mg tid.  2. 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  Prolonged VF arrest on 6/25. Chest closed 7/2.  - ASA  - Holding Crestor with elevated LFTs.  3. Cardiac arrest: VF arrest with prolonged code. Episodes of VT/NSVT in setting of low K (difficult to effectively replace). K now stable, minimal ectopy.  - Continue amiodarone 200 mg daily.  - Keep K > 4.0 Mg > 2.0  4. Acute hypoxemic respiratory failure: In setting of cardiac arrest.  H/o COPD.  Bibasilar infiltrates, possible HCAP.  Now with tracheostomy. Diflucan with candida in BAL and blood culture.  Remains on vent via trach, weaning has not progressed much. Chest CT 7/23 w/ concerns for ?developing empyema.  - CCM following and have discussed further CT drainage with TCTS but have deferred  5. Acute blood loss anemia: Now s/p mediastinal re-exploration x 3. Hgb 7.7 today.  - Monitor H/H. Transfuse for hgb <7.0 6. 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  Platelets have recovered. Now off bivalirudin and on Eliquis.  7. ID: Suspect HCAP initially, on meropenem/anidulafungin/vanco initially with ongoing fevers and leukocytosis. Venous dopplers negative for DVT. Possible drug fever from meropenem, he was switched to Zosyn 7/14 - 7/21.  He had abdominal US with gallbladder sludge, no definite cholecystitis.  HIDA scan done, GB does not opacify => ?acalculous cholecystitis => now with cholecystostomy tube.  Most recently, Candida in blood culture and BAL, he is on Diflucan. Exam by ophthalmology => no candidal endophthalmitis.  Foley and PICC removed 7/21.  He is now afebrile.  - Continue fluconazole until 2 wks from 7/27, continue vancomycin 2 wks.   - Chest CT w/ concerns for ?developing empyema. CCM and TCTS following, would consider drainage if recurrent fevers.  8. Elevated bilirubin: Mostly direct = likely cholestatic/shock liver/RV failure predominantly.   Abdominal US with evidence for cirrhosis, sludge in GB but no definite acute cholecystitis. NH3 was not significantly elevated. Tbili 6.8 today (7.4 yesterday). HIDA scan showed patent common bile duct, so not obstructive stone causing elevated bilirubin.  9. Ileus: Improved.  Getting TFs. S/p PEG tube placement 7/28 10. Atrial fibrillation/flutter: Paroxysmal. In NSR today.     - Continue PO amio 200 daily.  - Eliquis 5 mg bid 11. Neuro: CT head negative on 7/11. EEG with e/o diffuse encephalopathy.  - seems to be improving. Following commands  12. Hypernatremia: Na 147 today  - Continue free water boluses   Lyda Jester, PA-C  09/07/2019 7:17 AM  Patient seen with PA, agree with the above note.   Weight is trending up slowly, I/Os positive today.  BUN/creatinine stable.  MAP stable.  Slept through the night with trazodone.   Weaning this morning.   General: NAD Neck: JVD difficult with trach, no thyromegaly or thyroid nodule.  Lungs: Decreased at  bases.  CV: Nondisplaced PMI.  Heart regular S1/S2, no S3/S4, no murmur.  Trace ankle edema. Abdomen: Soft, nontender, no hepatosplenomegaly, no distention.  Skin: Jaundice.   Neurologic: Awake on vent. Extremities: No clubbing or cyanosis.  HEENT: Normal.   I will increase Lasix to 40 mg bid per tube today.   Decrease midodrine  to 5 mg tid.   Continues on vancomycin and Diflucan.   We will see again on Monday unless called.   Loralie Champagne 09/07/2019 7:31 AM

## 2019-09-07 NOTE — Care Management (Signed)
TOC certified medical assistant placed a benefits check for Eliquis 5 mg po BID.

## 2019-09-08 ENCOUNTER — Inpatient Hospital Stay (HOSPITAL_COMMUNITY): Payer: Medicare Other

## 2019-09-08 LAB — BASIC METABOLIC PANEL
Anion gap: 8 (ref 5–15)
BUN: 70 mg/dL — ABNORMAL HIGH (ref 8–23)
CO2: 22 mmol/L (ref 22–32)
Calcium: 8.2 mg/dL — ABNORMAL LOW (ref 8.9–10.3)
Chloride: 115 mmol/L — ABNORMAL HIGH (ref 98–111)
Creatinine, Ser: 0.92 mg/dL (ref 0.61–1.24)
GFR calc Af Amer: >60 mL/min (ref >60)
GFR calc non Af Amer: >60 mL/min (ref >60)
Glucose, Bld: 185 mg/dL — ABNORMAL HIGH (ref 70–99)
Potassium: 4.1 mmol/L (ref 3.5–5.1)
Sodium: 145 mmol/L (ref 135–145)

## 2019-09-08 LAB — CBC
HCT: 27.3 % — ABNORMAL LOW (ref 39.0–52.0)
Hemoglobin: 7.6 g/dL — ABNORMAL LOW (ref 13.0–17.0)
MCH: 30.6 pg (ref 26.0–34.0)
MCHC: 27.8 g/dL — ABNORMAL LOW (ref 30.0–36.0)
MCV: 110.1 fL — ABNORMAL HIGH (ref 80.0–100.0)
Platelets: 281 10*3/uL (ref 150–400)
RBC: 2.48 MIL/uL — ABNORMAL LOW (ref 4.22–5.81)
RDW: 21.9 % — ABNORMAL HIGH (ref 11.5–15.5)
WBC: 13.4 10*3/uL — ABNORMAL HIGH (ref 4.0–10.5)
nRBC: 0 % (ref 0.0–0.2)

## 2019-09-08 LAB — GLUCOSE, CAPILLARY
Glucose-Capillary: 178 mg/dL — ABNORMAL HIGH (ref 70–99)
Glucose-Capillary: 169 mg/dL — ABNORMAL HIGH (ref 70–99)
Glucose-Capillary: 181 mg/dL — ABNORMAL HIGH (ref 70–99)
Glucose-Capillary: 205 mg/dL — ABNORMAL HIGH (ref 70–99)
Glucose-Capillary: 140 mg/dL — ABNORMAL HIGH (ref 70–99)

## 2019-09-08 LAB — BILIRUBIN, DIRECT: Bilirubin, Direct: 4 mg/dL — ABNORMAL HIGH (ref 0.0–0.2)

## 2019-09-08 NOTE — Progress Notes (Signed)
NAME:  Todd [name], MRN:  [id number], DOB:  [birth year], LOS: 54 ADMISSION DATE:  07/26/2019, CONSULTATION DATE:  08/03/19 REFERRING MD:  Roxan Hockey, CHIEF COMPLAINT:  ECMO   Brief History   64 yo male admitted with CHF exacerbation, found to have multi-vessel disease requiring CABG 6/24, post op VT Arrest 6/25 and cannulated for VA ECMO.    History of present illness   Presented with worsening dyspnea 6/17 c/w CHF exacerbation.  LHC  with severe triple vessel disease.  Underwent CABG 6/24.  Vtach arrest 6/25.  Chest opened bedside and cardiac massage initiated as well as multiple cardioversions, amiodarone, bicarb etc.  Brought to OR and cannulated for VA ECMO.  PCCM consulted to assist with management  Comorbidities include DM, heavy smoking, COPD  Past Medical History  Depression Ischemic cardiomyopathy HTN HLD  Significant Hospital Events   6/24 CABG 6/25 VA cannulation 6/28 decannulated and impella placed 6/29 bedside re-exploration; s/p decannulation. Placement of impella device originally at p8 but decreased to p4 2/2 suction events overnight up to p6. Echo completed and repositioned device. Remained on considerable support with 8 epi and 46 norepi vaso 0.05. continued chest tube output with large clots noted. Heparin thru device. Replacement products ongoing. 60% 8 peep 6/30 bedside mediastinal re-exploration and clean out of hematoma with tamponade and worsening hemodynamics. Pt had large volume transfusion. rebolused with amio 2/2 nsvt episode.  Weight up 48 pounds.  Started diuresis, Lasix drip started. 7/01 iatrogenic respiratory alkalosis, vent rate decreased. 7/02 No significant issues overnight remains on pressors and Impella not tolerating tube feeds with high gastric output awaiting core track placement 7/03 started trickle TF  7/05 Swab removed, CVL and PICC placed. 7/13 Trach and BAL 7/16 Awake this AM and tracking. No follow commands. Still having fevers. Successful  ultrasound and CT guided placement of a 10.2 French cholecystostomy tube. A small amount of aspirated bile was capped and sent to the laboratory for analysis 7/17 Off sedation gtt. doing pressure support ventilation.  Beginning to follow some commands occasionally intermittently.  Very jaundiced.  Growing Candida in the blood and BAL.  Still febrile but T-max is coming down.  WBC persistently elevated; currently at 19.4 okay.  Diflucan started. Currently on milrinone GTT, amiodarone gtt., vasopressin gtt., Levophed gtt. and bivalrudin gtt. Off epi gtt. Cards planning lasix x 1 today. 7/21 Ongoing fevers, WBC increased 7/22: Persistent fevers, pan cultured with CT Chest/Head. Suspected source tracheobronchitis.   Consults:  Advanced heart failure, PCCM, TCTS  Procedures:  LUE PICC 7/5 >>7/21 R Brachial ALine 7/9 >>7/27 Chest Tubes >> L IJ CVC 7/21 >>7/27 Peg tube 7/27  Significant Diagnostic Tests:  6/22 spirometry with restrictive physiology, preserved FEV1/FVC 6/18 echo: LVEF 74-73%, grade I diastolic dysfunction 4/03 US Abdomen cholelithiasis without cholecystitis. Mild wall thinking in setting of liver disease and ascites.  7/10: LE [place] negative for DVT  7/20 CT ABD w contrast >> stable position of the percutaneous cholecystostomy tube with complete decompression of the gallbladder, trace residual free fluid within the RUQ, decreased since prior, trace bilateral pleural effusions L>R, scattered areas of ground glass airspace disease within the RML, RLL 7/22: CT Chest w/o Contrast: Retrosternal fluid collection, non specific.  Bilateral pleural effusions, portions loculated.   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 Chronic microvascular angiopathy. Stable bilateral mastoid and middle ear effusions.   Micro Data:  COVID and HIV 6/17 >> Neg 7/3 respiratory >> few Candida tropicalis  7/5 blood >> negative  7/5 respiratory >> few Candida  tropicalis 7/7 urine >> negative 7/7 blood >> negative  7/9 wound >> negative 7/12 BAL >> candida tropicalis, candida albicans 7/14 BCID >> candida parapsilosis   7/14 BCx2 >> candida parapsilosis  7/17 BCx2 >> NGTD 7/21 BCx2 >> NGTD 7/22 Respiratory Cx >> candida 7/24 c diff neg  Antimicrobials:  Vanc 6/24>>7/13 Cefuroxime 6/24 >> 6/25 merrem 6/26 >> 7/14 Anidulafungin 7/8 >> 7/14 Zosyn 7/14 >>7/21 Diflucan 7/17 >> Vanco 7/22 >> Mero 7/22 >>  Interim history/subjective:  7/30: no events overnight, began trazodone last pm which appears to have helped with restlessness overnight. Sodium is increasing. Pt is calm and alert this am. On 15/5 which is up from yesterday. Turned to 10/5 and adequate tv. Will cont to follow.  7/29: pt with agitation/restlessness overnight. Trazodone added by CTS. Pt is alert and pleasant this am. Appears comfortable on decreased ps settings 12/5 will cont to decrease. HF weaning midodrine today and attempting diuresis. Wbc is up somewhat but afebrile 7/28: T max 99.8 last 24 hours WBC 17.3 ->14.8 Transitioned to eliquis Copious secretions continue Remains Jaundiced but bili and alk phos improving   Objective   Blood pressure (!) 119/59, pulse 76, temperature 99.8 F (37.7 C), resp. rate (!) 24, height _0  (1.753 m), weight 67.7 kg, SpO2 100 %.    Vent Mode: PSV;CPAP FiO2 (%):  [30 %-40 %] 40 % Set Rate:  [20 bmp] 20 bmp Vt Set:  [420 mL] 420 mL PEEP:  [5 cmH20] 5 cmH20 Pressure Support:  [15 cmH20] 15 cmH20 Plateau Pressure:  [13 cmH20-14 cmH20] 13 cmH20   Intake/Output Summary (Last 24 hours) at 09/08/2019 1359 Last data filed at 09/08/2019 1300 Gross per 24 hour  Intake 2640.06 ml  Output 2395 ml  Net 245.06 ml   Filed Weights   09/06/19 0309 09/07/19 0500 09/08/19 0500  Weight: 69.2 kg 69.4 kg 67.7 kg   Exam General: Adult male on trach collar , appears chronically ill, deconditioned but comfortable  HEENT: NCAT, MM dry, old blood  noted, trach midline #8 Neuro -awake follows one-step commands, grossly weak but improving in strength. Alert and tracks to voice.  CV: s1s2 RRR, SR on monitor, no m/r/g, midline sternal wound with staples, chest tubes in place, serous drainage PULM: Bilateral chest excursion,diminished throughout, chest tube in place, no increased wob GI: soft, bsx4 active, peg tube in place with improved tenderness but no rebound or guarding Extremities: warm/dry, no edema , brisk cap refill, clubbing noted, ischemic changes on R finger tip Skin: no rashes or lesions on exposed skin. Sternal wound with staples,  Sacral decubitus approximately 3x3 inch site, Appears jaundiced but improving  Lab Results  Component Value Date   WBC 13.4 (H) 09/08/2019   HGB 7.6 (L) 09/08/2019   HCT 27.3 (L) 09/08/2019   MCV 110.1 (H) 09/08/2019   PLT 281 09/08/2019   Lab Results  Component Value Date   CREATININE 0.92 09/08/2019   BUN 70 (H) 09/08/2019   NA 145 09/08/2019   K 4.1 09/08/2019   CL 115 (H) 09/08/2019   CO2 22 09/08/2019   Resolved Hospital Problem list     Assessment & Plan:   Critically ill due to cardiogenic/hemorrhagic shock status post CABG complicated by VT arrest,  S/p VA ECMO (decanulated 6/28). S/p impella placement (6/28> 7/9) Vasopressors are off. - lines removed  -HF decreasing midodrine 45m tid  Atrial flutter -continue amiodarone, ASA, s/p  Bivalirudin for HITT -now on eliquis  Critically ill due to acute hypoxic respiratory failure w/ need for mechanical ventilation - status post tracheostomy Poor lung mechanics due to weakness,  -Xopenex -Chest tube to water seal 7/22, continue.  -Continue Chest PT. Improved secretions -Slow wean, tolerating pressure support 10/5 this am.  -not tolerating atc at this time.  -will cont vent wean to that  Deconditioning of Critical Illness  --Continue PT/OT  Hyperbilirubinemia due to acalculous cholecystitis Possible cirrhosis per 7/8 [place]    Status post  percutaneous GB drain  Remains jaundiced -Consider removing drain if Direct Bili is normal .  T2DM with fluctuant hypo/hyperglycemia --euglycemic - s/p peg placement 7.27 back on tf  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 --continue diflucan, will need 2 weeks antifungal coverage --Ophthamology has evaluated this admission --All lines out  Acute blood loss anemia and critical illness anemia Heparin-induced thrombocytopenia>> resolved --Bivalirudin stopped and started eliquis --Hgb 7.7 , no evidence of bleeding - trend CBC - Monitor for bleeding - transfuse as needed for HGB < 7  Acute kidney injury:  -improving -follow indices and uop Hypernatremia: -increasing to 147 today.  -cont fwf but increase  Acute encephalopathy  --CTH without acute intracranial pathology --Minimize sedating medications --Delirium prevention measures --EEG neg 7/23  --cont thiamine  Sacral Decubitus  --Wound care appreciated  Small left adrenal mass seen on CT scan of the abdomen August 24, 2019  Fever ,suspected HAP downtrending fever curve, and wbc at this time -cont to monitor.  -no effusions on chest imaging (cxr) -if wbc worsens may benefit from repeat ct to further delineate need for intervention of potentially loculated effusion seen on 7/23 but not present or overt on cxr.   Daily Goals Checklist  Pain/Anxiety/Delirium protocol (if indicated): fentanyl as needed only VAP protocol (if indicated): bundle in place.  DVT prophylaxis: eliquis Nutrition Status: tf GI prophylaxis: PPI Urinary catheter: n/a Central lines: removed Glucose control: See above Mobility/therapy needs: PT for ROM Code Status: Full  Family Communication: per primary Disposition: ICU, Anticipate LTACH need.   Kipp Brood, MD [facility] ICU Physician [place]  Pager: [phone] Mobile: [phone] After hours: [phone].  09/08/2019, 2:01  PM      09/08/2019, 1:59 PM

## 2019-09-08 NOTE — Progress Notes (Signed)
Physical Therapy Wound Treatment Patient Details  Name: Todd [name] MRN: [id number] Date of Birth: Apr 12, 1955  Today's Date: 09/08/2019 Time: 1104-1140 Time Calculation (min): 36 min  Subjective  Subjective: unable (vent/trach), smiling intermittently Patient and Family Stated Goals: unable Date of Onset:  (unknown) Prior Treatments: foam dressing  Pain Score: 4/10  Wound Assessment  Pressure Injury 08/09/19 Sacrum Right Unstageable - Full thickness tissue loss in which the base of the injury is covered by slough (yellow, tan, gray, green or brown) and/or eschar (tan, brown or black) in the wound bed. has evolved into unstageable whe (Active)  Dressing Type ABD;Barrier Film (skin prep);Gauze (Comment);Moist to moist 09/08/19 1253  Dressing Changed;Dry;Clean;Intact 09/08/19 1253  Dressing Change Frequency Daily 09/08/19 1253  State of Healing Eschar 09/08/19 1253  Site / Wound Assessment Brown;Yellow;Red 09/08/19 1253  % Wound base Red or Granulating 10% 09/08/19 1253  % Wound base Yellow/Fibrinous Exudate 90% 09/08/19 1253  % Wound base Black/Eschar 0% 09/08/19 1253  % Wound base Other/Granulation Tissue (Comment) 0% 09/08/19 1253  Peri-wound Assessment Erythema (blanchable) 09/08/19 1253  Wound Length (cm) 8.6 cm 09/07/19 1258  Wound Width (cm) 8.7 cm 09/07/19 1258  Wound Depth (cm) 0.1 cm 09/07/19 1258  Wound Surface Area (cm^2) 74.82 cm^2 09/07/19 1258  Wound Volume (cm^3) 7.48 cm^3 09/07/19 1258  Margins Unattached edges (unapproximated) 09/08/19 1253  Drainage Amount Minimal 09/08/19 1253  Drainage Description Sanguineous 09/08/19 1253  Treatment Debridement (Selective);Hydrotherapy (Pulse lavage);Packing (Saline gauze) 09/08/19 1253   Santyl applied to wound bed prior to applying dressing.     Hydrotherapy Pulsed lavage therapy - wound location: sacrum Pulsed Lavage with Suction (psi): 12 psi Pulsed Lavage with Suction - Normal Saline Used: 1000 mL Pulsed Lavage Tip:  Tip with splash shield Selective Debridement Selective Debridement - Location: sacrum Selective Debridement - Tools Used: Forceps;Scissors Selective Debridement - Tissue Removed: yellow/gray slough   Wound Assessment and Plan  Wound Therapy - Assess/Plan/Recommendations Wound Therapy - Clinical Statement: Wound bleeds very easily at attempts at sharp debridement. [name] from hydotherapy (in conjunction with enzymatic debridement) to remove necrotic tissue to promote wound healing.  Wound Therapy - Functional Problem List: limited positioning options due to pressure wound Factors Delaying/Impairing Wound Healing: Diabetes Mellitus;Incontinence;Immobility;Multiple medical problems Hydrotherapy Plan: Debridement;Dressing change;Patient/family education;Pulsatile lavage with suction Wound Therapy - Frequency: 6X / week Wound Therapy - Follow Up Recommendations: Other (comment) (LTACH) Wound Plan: see above  Wound Therapy Goals- Improve the function of patient's integumentary system by progressing the wound(s) through the phases of wound healing (inflammation - proliferation - remodeling) by: Decrease Necrotic Tissue to: 50% Decrease Necrotic Tissue - Progress: Progressing toward goal Increase Granulation Tissue to: 50 Increase Granulation Tissue - Progress: Progressing toward goal Time For Goal Achievement: 7 days Wound Therapy - Potential for Goals: Fair  Goals will be updated until maximal potential achieved or discharge criteria met.  Discharge criteria: when goals achieved, discharge from hospital, MD decision/surgical intervention, no progress towards goals, refusal/missing three consecutive treatments without notification or medical reason.  GP      Wyona Almas, PT, DPT Acute Rehabilitation Services Pager [phone] Office [phone]   Todd [name] Etienne 09/08/2019, 2:57 PM

## 2019-09-08 NOTE — Progress Notes (Signed)
Called to patient's room because patient had desaturated to 74% while on .40 trach collar.  Pt was placed on vent in full support mode and suctioned for moderate amount of tan secretions.  Total time for trach collar weaning today was 5.25 hours.

## 2019-09-08 NOTE — Progress Notes (Signed)
22 Days Post-Op Procedure(s) (LRB): REMOVAL OF IMPELLA LEFT VENTRICULAR ASSIST DEVICE (N/A) TRANSESOPHAGEAL ECHOCARDIOGRAM (TEE) (N/A) Subjective: No complaints  Objective: Vital signs in last 24 hours: Temp:  [98.5 F (36.9 C)-100.2 F (37.9 C)] 99.8 F (37.7 C) (07/31 0758) Pulse Rate:  [68-150] 81 (07/31 0700) Cardiac Rhythm: Normal sinus rhythm (07/31 0400) Resp:  [16-28] 21 (07/31 0700) BP: (108-136)/(41-66) 120/66 (07/31 0700) SpO2:  [97 %-100 %] 100 % (07/31 0729) FiO2 (%):  [30 %] 30 % (07/31 0729) Weight:  [67.7 kg] 67.7 kg (07/31 0500)  Hemodynamic parameters for last 24 hours:    Intake/Output from previous day: 07/30 0701 - 07/31 0700 In: 2430.1 [NG/GT:2000; IV Piggyback:350.1] Out: 2135 [Urine:1815; Drains:160; Chest Tube:160] Intake/Output this shift: No intake/output data recorded.  General appearance: alert and cooperative Neurologic: intact Heart: regular rate and rhythm, S1, S2 normal, no murmur, click, rub or gallop Lungs: diminished breath sounds bibasilar Abdomen: soft, non-tender; bowel sounds normal; no masses,  no organomegaly Extremities: extremities normal, atraumatic, no cyanosis or edema Wound: c/d/i  Lab Results: Recent Labs    09/07/19 0037 09/08/19 0114  WBC 13.8* 13.4*  HGB 7.7* 7.6*  HCT 27.2* 27.3*  PLT 257 281   BMET:  Recent Labs    09/07/19 0037 09/08/19 0114  NA 147* 145  K 4.1 4.1  CL 116* 115*  CO2 23 22  GLUCOSE 199* 185*  BUN 76* 70*  CREATININE 0.99 0.92  CALCIUM 8.2* 8.2*    PT/INR: No results for input(s): LABPROT, INR in the last 72 hours. ABG    Component Value Date/Time   PHART 7.416 08/29/2019 1440   HCO3 25.7 08/29/2019 1440   TCO2 27 08/29/2019 1440   ACIDBASEDEF 1.0 08/08/2019 1115   O2SAT 67.1 09/02/2019 0234   CBG (last 3)  Recent Labs    09/07/19 2344 09/08/19 0333 09/08/19 0727  GLUCAP 157* 178* 169*    Assessment/Plan: S/P Procedure(s) (LRB): REMOVAL OF IMPELLA LEFT VENTRICULAR  ASSIST DEVICE (N/A) TRANSESOPHAGEAL ECHOCARDIOGRAM (TEE) (N/A) Mobilize Diuresis vent wean  Leave chest tubes for now Anticipate LTAC in near future   LOS: 44 days    Todd [name] 09/08/2019

## 2019-09-08 NOTE — Progress Notes (Signed)
TCTS Evening Rounds  Back on vent tonight after TC trials, long family visitation today PE: BP (!) 132/58   Pulse 77   Temp 97.7 F (36.5 C)   Resp (!) 28   Ht 5\' 9"  (1.753 m)   Wt 67.7 kg   SpO2 90%   BMI 22.04 kg/m   CTA RRR   Intake/Output Summary (Last 24 hours) at 09/08/2019 2140 Last data filed at 09/08/2019 2000 Gross per 24 hour  Intake 2130.06 ml  Output 2100 ml  Net 30.06 ml    A/p: continue supportive care Artisha Capri Z. 09/10/2019, MD [phone]

## 2019-09-08 NOTE — Progress Notes (Signed)
eLink Physician-Brief Progress Note Patient Name: LUCAH PETTA DOB: 1955-05-14 MRN: [id number]   Date of Service  09/08/2019  HPI/Events of Note  Multiple loose stools. Request for Flexiseal.   eICU Interventions  Plan: 1. Place Flexiseal.      Intervention Category Major Interventions: Other:  [name] 09/08/2019, 8:21 PM

## 2019-09-09 ENCOUNTER — Inpatient Hospital Stay (HOSPITAL_COMMUNITY): Payer: Medicare Other

## 2019-09-09 LAB — GLUCOSE, CAPILLARY
Glucose-Capillary: 163 mg/dL — ABNORMAL HIGH (ref 70–99)
Glucose-Capillary: 189 mg/dL — ABNORMAL HIGH (ref 70–99)
Glucose-Capillary: 191 mg/dL — ABNORMAL HIGH (ref 70–99)
Glucose-Capillary: 194 mg/dL — ABNORMAL HIGH (ref 70–99)
Glucose-Capillary: 199 mg/dL — ABNORMAL HIGH (ref 70–99)
Glucose-Capillary: 203 mg/dL — ABNORMAL HIGH (ref 70–99)

## 2019-09-09 LAB — CBC
HCT: 28.4 % — ABNORMAL LOW (ref 39.0–52.0)
Hemoglobin: 7.9 g/dL — ABNORMAL LOW (ref 13.0–17.0)
MCH: 30.5 pg (ref 26.0–34.0)
MCHC: 27.8 g/dL — ABNORMAL LOW (ref 30.0–36.0)
MCV: 109.7 fL — ABNORMAL HIGH (ref 80.0–100.0)
Platelets: 255 10*3/uL (ref 150–400)
RBC: 2.59 MIL/uL — ABNORMAL LOW (ref 4.22–5.81)
RDW: 21.1 % — ABNORMAL HIGH (ref 11.5–15.5)
WBC: 15 10*3/uL — ABNORMAL HIGH (ref 4.0–10.5)
nRBC: 0 % (ref 0.0–0.2)

## 2019-09-09 LAB — COMPREHENSIVE METABOLIC PANEL
ALT: 118 U/L — ABNORMAL HIGH (ref 0–44)
AST: 99 U/L — ABNORMAL HIGH (ref 15–41)
Albumin: 1.6 g/dL — ABNORMAL LOW (ref 3.5–5.0)
Alkaline Phosphatase: 436 U/L — ABNORMAL HIGH (ref 38–126)
Anion gap: 9 (ref 5–15)
BUN: 72 mg/dL — ABNORMAL HIGH (ref 8–23)
CO2: 22 mmol/L (ref 22–32)
Calcium: 8.2 mg/dL — ABNORMAL LOW (ref 8.9–10.3)
Chloride: 115 mmol/L — ABNORMAL HIGH (ref 98–111)
Creatinine, Ser: 0.94 mg/dL (ref 0.61–1.24)
GFR calc Af Amer: >60 mL/min (ref >60)
GFR calc non Af Amer: >60 mL/min (ref >60)
Glucose, Bld: 203 mg/dL — ABNORMAL HIGH (ref 70–99)
Potassium: 4.7 mmol/L (ref 3.5–5.1)
Sodium: 146 mmol/L — ABNORMAL HIGH (ref 135–145)
Total Bilirubin: 5.8 mg/dL — ABNORMAL HIGH (ref 0.3–1.2)
Total Protein: 5.7 g/dL — ABNORMAL LOW (ref 6.5–8.1)

## 2019-09-09 NOTE — Progress Notes (Signed)
RT note- Patient was placed back to ATC and observed respiratory pattern. Patient is having 5-10 sec periods of apnea and at times longer. Sp02 has maintained and patient is awake, and c/o headache. RN notified.

## 2019-09-09 NOTE — Progress Notes (Signed)
NAME:  Todd LICHTY, MRN:  [id number], DOB:  November 19, 1955, LOS: 33 ADMISSION DATE:  07/26/2019, CONSULTATION DATE:  08/03/19 REFERRING MD:  Roxan Hockey, CHIEF COMPLAINT:  ECMO   Brief History   64 yo male admitted with CHF exacerbation, found to have multi-vessel disease requiring CABG 6/24, post op VT Arrest 6/25 and cannulated for VA ECMO.    History of present illness   Presented with worsening dyspnea 6/17 c/w CHF exacerbation.  LHC  with severe triple vessel disease.  Underwent CABG 6/24. Vtach arrest 6/25.  Chest opened bedside and cardiac massage initiated as well as multiple cardioversions, amiodarone, bicarb etc.  Brought to OR and cannulated for VA ECMO.  PCCM consulted to assist with management  Comorbidities include DM, heavy smoking, COPD  Past Medical History  Depression Ischemic cardiomyopathy HTN HLD  Significant Hospital Events   6/24 CABG 6/25 VA cannulation 6/28 decannulated and impella placed 6/29 bedside re-exploration; s/p decannulation. Placement of impella device originally at p8 but decreased to p4 2/2 suction events overnight up to p6. Echo completed and repositioned device. Remained on considerable support with 8 epi and 46 norepi vaso 0.05. continued chest tube output with large clots noted. Heparin thru device. Replacement products ongoing. 60% 8 peep 6/30 bedside mediastinal re-exploration and clean out of hematoma with tamponade and worsening hemodynamics. Pt had large volume transfusion. rebolused with amio 2/2 nsvt episode.  Weight up 48 pounds.  Started diuresis, Lasix drip started. 7/01 iatrogenic respiratory alkalosis, vent rate decreased. 7/02 No significant issues overnight remains on pressors and Impella not tolerating tube feeds with high gastric output awaiting core track placement 7/03 started trickle TF  7/05 Swab removed, CVL and PICC placed. 7/13 Trach and BAL 7/16 Awake and tracking. No follow commands. Still having fevers. Successful  ultrasound and CT guided placement of a 10.2 French cholecystostomy tube. A small amount of aspirated bile was capped and sent to the laboratory for analysis 7/17 Off sedation, PSV.  Beginning to follow some commands occasionally intermittently.  Very jaundiced.  Growing Candida in the blood and BAL > diflucan.   7/21 Ongoing fevers, WBC increased 7/22 Persistent fevers, pan cultured with CT Chest/Head. Suspected source tracheobronchitis.  7/28 Transitioned to eliquis, jaundiced but labs improving  7/29 Agitation/restless.  Trazodone added QHS. Weaning midodrine, attempting diuresis  7/30 Increased Na, weaning on PSV.  8/01 On 40% ATC, significantly improved mental status  Consults:  Advanced heart failure, PCCM, TCTS  Procedures:  LUE PICC 7/5 >>7/21 R Brachial ALine 7/9 >>7/27 Chest Tubes >> L IJ CVC 7/21 >>7/27 Peg tube 7/27 >>  Significant Diagnostic Tests:  6/22 spirometry with restrictive physiology, preserved FEV1/FVC 6/18 echo: LVEF 93-57%, grade I diastolic dysfunction 0/17 US Abdomen cholelithiasis without cholecystitis. Mild wall thinking in setting of liver disease and ascites.  7/10 LE [place] negative for DVT  7/20 CT ABD w contrast >> stable position of the percutaneous cholecystostomy tube with complete decompression of the gallbladder, trace residual free fluid within the RUQ, decreased since prior, trace bilateral pleural effusions L>R, scattered areas of ground glass airspace disease within the RML, RLL 7/22 CT Chest w/o Contrast >> Retrosternal fluid collection, non specific.  Bilateral pleural effusions, portions loculated.   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 Stable bilateral mastoid and middle ear effusions.   Micro Data:  COVID and HIV 6/17 >> Neg 7/3 respiratory >> few Candida tropicalis 7/5 blood >> negative  7/5  respiratory >> few Candida tropicalis 7/7 urine >> negative 7/7  blood >> negative  7/9 wound >> negative 7/12 BAL >> candida tropicalis, candida albicans 7/14 BCID >> candida parapsilosis   7/14 BCx2 >> candida parapsilosis  7/17 BCx2 >> negative  7/21 BCx2 >> negative  7/22 Respiratory Cx >> candida 7/24 c diff >> neg  Antimicrobials:  Vanc 6/24>>7/13 Cefuroxime 6/24 >> 6/25 merrem 6/26 >> 7/14 Anidulafungin 7/8 >> 7/14 Zosyn 7/14 >>7/21 Diflucan 7/17 >>  Vanco 7/22 >>  Mero 7/22 >>  Interim history/subjective:  No acute events overnight > pt asking for hand mitts to be removed Afebrile / fever curve improved  On ATC 40%   Objective   Blood pressure (!) 115/42, pulse 78, temperature 98.6 F (37 C), resp. rate 20, height _0  (1.753 m), weight 68.7 kg, SpO2 100 %.    Vent Mode: PSV;CPAP FiO2 (%):  [30 %-40 %] (P) 40 % Set Rate:  [20 bmp] 20 bmp Vt Set:  [420 mL] 420 mL PEEP:  [5 cmH20] 5 cmH20 Pressure Support:  [15 cmH20] 15 cmH20 Plateau Pressure:  [15 cmH20-18 cmH20] 18 cmH20   Intake/Output Summary (Last 24 hours) at 09/09/2019 0949 Last data filed at 09/09/2019 0800 Gross per 24 hour  Intake 2874.96 ml  Output 2195 ml  Net 679.96 ml   Filed Weights   09/07/19 0500 09/08/19 0500 09/09/19 0500  Weight: 69.4 kg 67.7 kg 68.7 kg   Exam General: chronically ill appearing adult male lying in bed in NAD on ATC HEENT: MM pink/moist, trach midline c/d/i, jaundice Neuro: Awake, alert, nods / interacts appropriately CV: s1s2 RRR, SR on monitor, no m/r/g, midline sternal wound, chest tube in place PULM: non-labored on ATC, lungs bilaterally diminished but clear GI: soft, bsx4 active, PEG Extremities: warm/dry, no significant edema  Skin: no rashes or lesions.  Multiple areas ecchymosis  Lab Results  Component Value Date   WBC 15.0 (H) 09/09/2019   HGB 7.9 (L) 09/09/2019   HCT 28.4 (L) 09/09/2019   MCV 109.7 (H) 09/09/2019   PLT 255 09/09/2019   Lab Results  Component Value Date   CREATININE 0.94 09/09/2019   BUN 72 (H)  09/09/2019   NA 146 (H) 09/09/2019   K 4.7 09/09/2019   CL 115 (H) 09/09/2019   CO2 22 09/09/2019   Resolved Hospital Problem list     Assessment & Plan:   Cardiogenic/hemorrhagic shock status post CABG complicated by VT arrest S/p VA ECMO (decanulated 6/28). S/p impella placement (6/28> 7/9) -continue midodrine 8m TID -diuresis per CHF service  -follow I/O's  -post operative care per TCTS  Atrial flutter -continue amiodarone, ASA, Eliquis  -tele monitoring   Acute hypoxic respiratory failure w/ need for mechanical ventilation - status post tracheostomy Poor lung mechanics due to weakness, deconditioning   -continue xopenex -chest PT / mobilize -ATC as tolerated during day with QHS vent support at night / daytime if evidence of fatigue -SLP consult to begin process of PMV use  -PRVC 8cc/kg as rest mode   Deconditioning of Critical Illness  -PT / OT efforts   Hyperbilirubinemia due to acalculous cholecystitis Possible cirrhosis per 7/8 [place] Status post  percutaneous GB drain  Remains jaundiced -drain care per IR  -follow T.Bili, consider removal once normalized   DM II with fluctuant hypo/hyperglycemia -monitor glucose  -continue TF   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  -diflucan, planned stop date 8/10 -appreciate ID assistance  Acute blood loss anemia and critical illness anemia Heparin-induced thrombocytopenia >> resolved, required bivalirudin -Continue eliquis  -follow CBC -transfuse for Hgb <7%, active bleeding   Acute kidney injury Hypernatremia -Trend BMP / urinary output -Replace electrolytes as indicated -Avoid nephrotoxic agents, ensure adequate renal perfusion -continue free water flushes  Acute Metabolic Encephalopathy  CTH without acute intracranial pathology. EEG negative 7/23.  -minimize sedating medications -PT efforts  -delirium prevention measures  -continue thiamine   Sacral  Decubitus  -appreciate WOC assistance   Small left adrenal mass seen on CT scan of the abdomen August 24, 2019  Fever, Suspected HAP Improving fever curve -follow intermittent CXR  -if worsening of WBC/fever, consider CT chest imaging   Daily Goals Checklist  Pain/Anxiety/Delirium protocol (if indicated): fentanyl as needed only VAP protocol (if indicated): bundle in place.  DVT prophylaxis: eliquis Nutrition Status: TF  GI prophylaxis: PPI Urinary catheter: n/a Central lines: removed Glucose control: See above Mobility/therapy needs: PT for ROM Code Status: Full  Family Communication: per primary Disposition: ICU, Anticipate LTACH need.     Noe Gens, MSN, NP-C [facility] 09/09/2019, 10:12 AM   Please see [email or web address] for pager details.

## 2019-09-09 NOTE — Progress Notes (Signed)
TCTS Evening Rounds  Stable day off ventilator Making good uring HD stable. BP (!) 128/55   Pulse 76   Temp 98.1 F (36.7 C)   Resp 22   Ht 5\' 9"  (1.753 m)   Wt 68.7 kg   SpO2 99%   BMI 22.37 kg/m   A/O CTA RRR   Intake/Output Summary (Last 24 hours) at 09/09/2019 1913 Last data filed at 09/09/2019 1900 Gross per 24 hour  Intake 3779.96 ml  Output 2510 ml  Net 1269.96 ml     A/p: May remove chest tubes tomorrow Back on vent overnight as needed Lisbeth Puller Z. 11/09/2019, MD [phone]

## 2019-09-09 NOTE — Progress Notes (Signed)
23 Days Post-Op Procedure(s) (LRB): REMOVAL OF IMPELLA LEFT VENTRICULAR ASSIST DEVICE (N/A) TRANSESOPHAGEAL ECHOCARDIOGRAM (TEE) (N/A) Subjective: No complaints; off ventilator this am  Objective: Vital signs in last 24 hours: Temp:  [97.7 F (36.5 C)-98.6 F (37 C)] 98.6 F (37 C) (08/01 0747) Pulse Rate:  [66-148] 78 (08/01 0800) Cardiac Rhythm: Normal sinus rhythm;Heart block (08/01 0800) Resp:  [16-28] 20 (08/01 0800) BP: (100-132)/(42-92) 115/42 (08/01 0800) SpO2:  [90 %-100 %] 100 % (08/01 0800) FiO2 (%):  [30 %-40 %] (P) 40 % (08/01 0857) Weight:  [68.7 kg] 68.7 kg (08/01 0500)  Hemodynamic parameters for last 24 hours:    Intake/Output from previous day: 07/31 0701 - 08/01 0700 In: 2740 [NG/GT:2160; IV Piggyback:350] Out: 2195 [Urine:1775; Drains:90; Stool:130; Chest Tube:200] Intake/Output this shift: Total I/O In: 265 [NG/GT:265] Out: -   General appearance: alert, cooperative and no distress Neurologic: intact Heart: regular rate and rhythm, S1, S2 normal, no murmur, click, rub or gallop Lungs: clear to auscultation bilaterally Abdomen: soft, non-tender; bowel sounds normal; no masses,  no organomegaly Extremities: edema mild Wound: c/d/i jaundiced  Lab Results: Recent Labs    09/08/19 0114 09/09/19 0140  WBC 13.4* 15.0*  HGB 7.6* 7.9*  HCT 27.3* 28.4*  PLT 281 255   BMET:  Recent Labs    09/08/19 0114 09/09/19 0140  NA 145 146*  K 4.1 4.7  CL 115* 115*  CO2 22 22  GLUCOSE 185* 203*  BUN 70* 72*  CREATININE 0.92 0.94  CALCIUM 8.2* 8.2*    PT/INR: No results for input(s): LABPROT, INR in the last 72 hours. ABG    Component Value Date/Time   PHART 7.416 08/29/2019 1440   HCO3 25.7 08/29/2019 1440   TCO2 27 08/29/2019 1440   ACIDBASEDEF 1.0 08/08/2019 1115   O2SAT 67.1 09/02/2019 0234   CBG (last 3)  Recent Labs    09/09/19 0003 09/09/19 0446 09/09/19 0727  GLUCAP 163* 203* 194*    Assessment/Plan: S/P Procedure(s)  (LRB): REMOVAL OF IMPELLA LEFT VENTRICULAR ASSIST DEVICE (N/A) TRANSESOPHAGEAL ECHOCARDIOGRAM (TEE) (N/A) Mobilize Diuresis trach collar trials   LOS: 45 days    [name] 09/09/2019

## 2019-09-09 NOTE — Progress Notes (Signed)
Pharmacy Antibiotic Note  Todd [name] is a 64 y.o. male admitted on 07/26/2019 with CHF exacerbation. 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 VT arrest on 6/25 and underwent VA ECMO. Decannulated on 6/28 and Impella placed. Chest closed on 7/2 and Imella removed 7/9. Cholecystostomy tube placed on 7/16.  Was on antibiotics initially for possible HCAP.   Pharmacy has been consulted for fluconazole for candidemia and vancomycin for wound and effusion.   Patient currently afebrile, wbc 15, stop dates in place for antibiotics.  Plan: -Continue vancomycin to 792m q24h -Continue fluconazole [id number] -Stop dates entered by ID team    Height: _0  (175.3 cm) Weight: 68.7 kg (151 lb 7.3 oz) IBW/kg (Calculated) : 70.7  Temp (24hrs), Avg:98.2 F (36.8 C), Min:97.7 F (36.5 C), Max:98.6 F (37 C)  Recent Labs  Lab 09/03/19 1311 09/04/19 0049 09/05/19 0040 09/06/19 0200 09/07/19 0037 09/08/19 0114 09/09/19 0140  WBC  --    < > 14.8* 16.5* 13.8* 13.4* 15.0*  CREATININE 1.30*   < > 1.12 0.98 0.99 0.92 0.94  VANCOTROUGH 22*  --   --   --   --   --   --    < > = values in this interval not displayed.    Estimated Creatinine Clearance: 76.1 mL/min (by C-G formula based on SCr of 0.94 mg/dL).    Allergies  Allergen Reactions  . Empagliflozin     Other reaction(s): diarrhea  . 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 Other     Other reaction(s): Unknown  . Simvastatin     Other reaction(s): diarrhea  . Sitagliptin     Other reaction(s): diarrhea/abd pain    Antimicrobials this admission: Zosyn 7/14>> 7/21 per ID Fluconazole 7/17>> (8/10) Cefuroxime 6/23>>6/25 Vancomycin 6/24>6/24;  6/26>>7/2, 7/5>>7/13 7/22>>(8/5) Meropenem 6/26>>7/4, 7/5>>7/14, 7/22>>7/26 [name] 7/7>>7/14  Dose adjustments thisdosing regimen: 6/30 VR 33, 7/1 22 (ke 0.026, T1/2 26.6) - change to vancomycin 1750 mg q36 7/7 VT = 19 (drawn prior to 3rd dose) -  reduce to 1g IV every 24 hours 7/11 VT 21 - reduce to 750 q 24, retime for later tonight>start 7/12 AM 7/26 VT 22 (drawn 3h late) - reduce to 750q24h  Microbiology results: 7/3+7/5 TA - few yeast> few candida topicalis  7/5 + 7/7 Bcx: neg 7/7 Ucx: neg 7/9 Wound Cx: neg 7/12 BAL: candida albicans, candida tropicalis 7/14 pleural fluid: rare candida parapsilosis 7/14 Blood>>>candida parapsilosis  7/16 abscess: ngF 7/17 BCx: ng 7/21 Blood: ng 7/22 Bcx: ng 7/22 TA: no orgs seen >> reincubating  FrErin HearingharmD., BCPS Clinical Pharmacist 09/09/2019 2:13 PM

## 2019-09-10 ENCOUNTER — Inpatient Hospital Stay (HOSPITAL_COMMUNITY): Payer: Medicare Other

## 2019-09-10 LAB — COMPREHENSIVE METABOLIC PANEL
ALT: 125 U/L — ABNORMAL HIGH (ref 0–44)
AST: 106 U/L — ABNORMAL HIGH (ref 15–41)
Albumin: 1.6 g/dL — ABNORMAL LOW (ref 3.5–5.0)
Alkaline Phosphatase: 485 U/L — ABNORMAL HIGH (ref 38–126)
Anion gap: 11 (ref 5–15)
BUN: 76 mg/dL — ABNORMAL HIGH (ref 8–23)
CO2: 19 mmol/L — ABNORMAL LOW (ref 22–32)
Calcium: 8.1 mg/dL — ABNORMAL LOW (ref 8.9–10.3)
Chloride: 111 mmol/L (ref 98–111)
Creatinine, Ser: 0.85 mg/dL (ref 0.61–1.24)
GFR calc Af Amer: >60 mL/min (ref >60)
GFR calc non Af Amer: >60 mL/min (ref >60)
Glucose, Bld: 237 mg/dL — ABNORMAL HIGH (ref 70–99)
Potassium: 4.9 mmol/L (ref 3.5–5.1)
Sodium: 141 mmol/L (ref 135–145)
Total Bilirubin: 5.5 mg/dL — ABNORMAL HIGH (ref 0.3–1.2)
Total Protein: 6 g/dL — ABNORMAL LOW (ref 6.5–8.1)

## 2019-09-10 LAB — CULTURE, FUNGUS WITHOUT SMEAR: Special Requests: NORMAL

## 2019-09-10 LAB — CBC
HCT: 29.1 % — ABNORMAL LOW (ref 39.0–52.0)
Hemoglobin: 8 g/dL — ABNORMAL LOW (ref 13.0–17.0)
MCH: 30.3 pg (ref 26.0–34.0)
MCHC: 27.5 g/dL — ABNORMAL LOW (ref 30.0–36.0)
MCV: 110.2 fL — ABNORMAL HIGH (ref 80.0–100.0)
Platelets: 221 10*3/uL (ref 150–400)
RBC: 2.64 MIL/uL — ABNORMAL LOW (ref 4.22–5.81)
RDW: 20.3 % — ABNORMAL HIGH (ref 11.5–15.5)
WBC: 17.7 10*3/uL — ABNORMAL HIGH (ref 4.0–10.5)
nRBC: 0 % (ref 0.0–0.2)

## 2019-09-10 LAB — GLUCOSE, CAPILLARY
Glucose-Capillary: 109 mg/dL — ABNORMAL HIGH (ref 70–99)
Glucose-Capillary: 109 mg/dL — ABNORMAL HIGH (ref 70–99)
Glucose-Capillary: 134 mg/dL — ABNORMAL HIGH (ref 70–99)
Glucose-Capillary: 144 mg/dL — ABNORMAL HIGH (ref 70–99)
Glucose-Capillary: 167 mg/dL — ABNORMAL HIGH (ref 70–99)
Glucose-Capillary: 227 mg/dL — ABNORMAL HIGH (ref 70–99)
Glucose-Capillary: 232 mg/dL — ABNORMAL HIGH (ref 70–99)

## 2019-09-10 MED ORDER — IPRATROPIUM-ALBUTEROL 0.5-2.5 (3) MG/3ML IN SOLN
3.0000 mL | Freq: Four times a day (QID) | RESPIRATORY_TRACT | Status: DC
  Administered 2019-09-10 – 2019-09-15 (×20): 3 mL via RESPIRATORY_TRACT
  Filled 2019-09-10 (×19): qty 3

## 2019-09-10 MED ORDER — INSULIN ASPART 100 UNIT/ML ~~LOC~~ SOLN
4.0000 [IU] | SUBCUTANEOUS | Status: DC
  Administered 2019-09-10 – 2019-09-11 (×6): 4 [IU] via SUBCUTANEOUS

## 2019-09-10 MED ORDER — CHOLESTYRAMINE 4 G PO PACK
4.0000 g | PACK | Freq: Two times a day (BID) | ORAL | Status: DC
  Administered 2019-09-11 – 2019-09-13 (×6): 4 g
  Filled 2019-09-10 (×7): qty 1

## 2019-09-10 MED ORDER — FUROSEMIDE 80 MG PO TABS
80.0000 mg | ORAL_TABLET | Freq: Two times a day (BID) | ORAL | Status: DC
  Administered 2019-09-10 – 2019-09-13 (×8): 80 mg
  Filled 2019-09-10 (×8): qty 1

## 2019-09-10 MED ORDER — INSULIN DETEMIR 100 UNIT/ML ~~LOC~~ SOLN
15.0000 [IU] | Freq: Two times a day (BID) | SUBCUTANEOUS | Status: DC
  Administered 2019-09-10 (×2): 15 [IU] via SUBCUTANEOUS
  Filled 2019-09-10 (×4): qty 0.15

## 2019-09-10 NOTE — TOC Transition Note (Signed)
Transition of Care [facility]) - CM/SW Discharge Note   Patient Details  Name: Todd [name] MRN: [id number] Date of Birth: 20-Mar-1955  [facility]) CM/SW Contact:  [name], RN Phone Number: 09/10/2019, 10:49 AM   Clinical Narrative:    Spoke with [name], RNCM and patient was denied admission to LTAC at this time.  Will continue to follow for alternative disposition opportunities to support in the patient's care.   Final next level of care: Long Term Acute Care (LTAC) Barriers to Discharge: Insurance Authorization   Patient Goals and CMS Choice Patient states their goals for this hospitalization and ongoing recovery are:: Patient's wife would like patient to transfer to [facility] CMS Medicare.gov Compare Post Acute Care list provided to:: Patient Represenative (must comment) (Patient's spouse - [name]) Choice offered to / list presented to : Spouse  Discharge Placement                       Discharge Plan and Services   Discharge Planning Services: CM Consult Post Acute Care Choice: Long Term Acute Care (LTAC)                               Social Determinants of Health (SDOH) Interventions     Readmission Risk Interventions Readmission Risk Prevention Plan 09/03/2019  Transportation Screening Complete  PCP or Specialist Appt within 3-5 Days Complete  HRI or Home Care Consult Complete  Social Work Consult for Recovery Care Planning/Counseling Complete  Palliative Care Screening Complete  Medication Review Oceanographer) Complete  Some recent data might be hidden

## 2019-09-10 NOTE — Plan of Care (Signed)
  Problem: Nutrition: Goal: Adequate nutrition will be maintained Outcome: Progressing Note: Tolerating tube feeds.   Problem: Elimination: Goal: Will not experience complications related to bowel motility Outcome: Progressing Goal: Will not experience complications related to urinary retention Outcome: Progressing   Problem: Pain Managment: Goal: General experience of comfort will improve Outcome: Progressing   Problem: Clinical Measurements: Goal: Respiratory complications will improve Outcome: Not Progressing Note: Did not tolerate trach collar this morning.

## 2019-09-10 NOTE — Progress Notes (Signed)
24 Days Post-Op Procedure(s) (LRB): REMOVAL OF IMPELLA LEFT VENTRICULAR ASSIST DEVICE (N/A) TRANSESOPHAGEAL ECHOCARDIOGRAM (TEE) (N/A) Subjective: Alert, comfortable, denies pain  Objective: Vital signs in last 24 hours: Temp:  [97.9 F (36.6 C)-98.6 F (37 C)] 98.3 F (36.8 C) (08/02 0738) Pulse Rate:  [66-81] 68 (08/02 0700) Cardiac Rhythm: Normal sinus rhythm (08/02 0400) Resp:  [12-33] 16 (08/02 0700) BP: (109-134)/(42-98) 114/51 (08/02 0700) SpO2:  [89 %-100 %] 100 % (08/02 0700) FiO2 (%):  [30 %-40 %] 40 % (08/02 0400) Weight:  [68.7 kg] 68.7 kg (08/02 0400)  Hemodynamic parameters for last 24 hours:    Intake/Output from previous day: 08/01 0701 - 08/02 0700 In: 2200 [I.V.:5; ES/PQ:3300; IV Piggyback:350] Out: 3065 [Urine:2135; Drains:200; Stool:300; Chest Tube:430] Intake/Output this shift: No intake/output data recorded.  General appearance: alert, cooperative and no distress Neurologic: nonfocal Heart: regular rate and rhythm Lungs: clear to auscultation bilaterally Abdomen: normal findings: soft, non-tender Wound: clean and dry  Lab Results: Recent Labs    09/09/19 0140 09/10/19 0145  WBC 15.0* 17.7*  HGB 7.9* 8.0*  HCT 28.4* 29.1*  PLT 255 221   BMET:  Recent Labs    09/09/19 0140 09/10/19 0145  NA 146* 141  K 4.7 4.9  CL 115* 111  CO2 22 19*  GLUCOSE 203* 237*  BUN 72* 76*  CREATININE 0.94 0.85  CALCIUM 8.2* 8.1*    PT/INR: No results for input(s): LABPROT, INR in the last 72 hours. ABG    Component Value Date/Time   PHART 7.416 08/29/2019 1440   HCO3 25.7 08/29/2019 1440   TCO2 27 08/29/2019 1440   ACIDBASEDEF 1.0 08/08/2019 1115   O2SAT 67.1 09/02/2019 0234   CBG (last 3)  Recent Labs    09/10/19 0100 09/10/19 0451 09/10/19 0735  GLUCAP 227* 232* 167*    Assessment/Plan: S/P Procedure(s) (LRB): REMOVAL OF IMPELLA LEFT VENTRICULAR ASSIST DEVICE (N/A) TRANSESOPHAGEAL ECHOCARDIOGRAM (TEE) (N/A) -NEURO- alert,  cooperative CV- in SR on amiodarone  BP Ok on midodrine RESP- continue trach collar trials  Will dc chest tubes  CXR not read yet but increased opacity right lung RENAL- creatinine stable, BUN remains elevated  Increase lasix to 80mg  VT BID ENDO- CBG consistently elevated, increase levemir, continue SSI GI- still jaundiced although improved from a week ago  Cholecystotomy tube in place Nutrition- Severe protein calorie malnutrition- tolerating goal TF ID- afebrile on vancomycin and fluconazole Deconditioning- severe Gangrene of tip of right index finger- no sign of infection   LOS: 46 days    09/10/2019

## 2019-09-10 NOTE — Progress Notes (Signed)
NAME:  Todd [name], MRN:  [id number], DOB:  08-05-1955, LOS: 52 ADMISSION DATE:  07/26/2019, CONSULTATION DATE:  08/03/19 REFERRING MD:  Roxan Hockey, CHIEF COMPLAINT:  ECMO   Brief History   64 yo male admitted with CHF exacerbation, found to have multi-vessel disease requiring CABG 6/24, post op VT Arrest 6/25 and cannulated for VA ECMO.    History of present illness   Presented with worsening dyspnea 6/17 c/w CHF exacerbation.  LHC  with severe triple vessel disease.  Underwent CABG 6/24. Vtach arrest 6/25.  Chest opened bedside and cardiac massage initiated as well as multiple cardioversions, amiodarone, bicarb etc.  Brought to OR and cannulated for VA ECMO.  PCCM consulted to assist with management  Comorbidities include DM, heavy smoking, COPD  Past Medical History  Depression Ischemic cardiomyopathy HTN HLD  Significant Hospital Events   6/24 CABG 6/25 VA cannulation 6/28 decannulated and impella placed 6/29 bedside re-exploration; s/p decannulation. Placement of impella device originally at p8 but decreased to p4 2/2 suction events overnight up to p6. Echo completed and repositioned device. Remained on considerable support with 8 epi and 46 norepi vaso 0.05. continued chest tube output with large clots noted. Heparin thru device. Replacement products ongoing. 60% 8 peep 6/30 bedside mediastinal re-exploration and clean out of hematoma with tamponade and worsening hemodynamics. Pt had large volume transfusion. rebolused with amio 2/2 nsvt episode.  Weight up 48 pounds.  Started diuresis, Lasix drip started. 7/01 iatrogenic respiratory alkalosis, vent rate decreased. 7/02 No significant issues overnight remains on pressors and Impella not tolerating tube feeds with high gastric output awaiting core track placement 7/03 started trickle TF  7/05 Swab removed, CVL and PICC placed. 7/13 Trach and BAL 7/16 Awake and tracking. No follow commands. Still having fevers. Successful  ultrasound and CT guided placement of a 10.2 French cholecystostomy tube. A small amount of aspirated bile was capped and sent to the laboratory for analysis 7/17 Off sedation, PSV.  Beginning to follow some commands occasionally intermittently.  Very jaundiced.  Growing Candida in the blood and BAL > diflucan.   7/21 Ongoing fevers, WBC increased 7/22 Persistent fevers, pan cultured with CT Chest/Head. Suspected source tracheobronchitis.  7/28 Transitioned to eliquis, jaundiced but labs improving  7/29 Agitation/restless.  Trazodone added QHS. Weaning midodrine, attempting diuresis  7/30 Increased Na, weaning on PSV.  8/01 On 40% ATC, significantly improved mental status  Consults:  Advanced heart failure, PCCM, TCTS  Procedures:  LUE PICC 7/5 >>7/21 R Brachial ALine 7/9 >>7/27 Chest Tubes >> L IJ CVC 7/21 >>7/27 Peg tube 7/27 >>  Significant Diagnostic Tests:  6/22 spirometry with restrictive physiology, preserved FEV1/FVC 6/18 echo: LVEF 30-07%, grade I diastolic dysfunction 6/22 US Abdomen cholelithiasis without cholecystitis. Mild wall thinking in setting of liver disease and ascites.  7/10 LE [place] negative for DVT  7/20 CT ABD w contrast >> stable position of the percutaneous cholecystostomy tube with complete decompression of the gallbladder, trace residual free fluid within the RUQ, decreased since prior, trace bilateral pleural effusions L>R, scattered areas of ground glass airspace disease within the RML, RLL 7/22 CT Chest w/o Contrast >> Retrosternal fluid collection, non specific.  Bilateral pleural effusions, portions loculated.   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 Stable bilateral mastoid and middle ear effusions.   Micro Data:  COVID and HIV 6/17 >> Neg 7/3 respiratory >> few Candida tropicalis 7/5 blood >> negative  7/5  respiratory >> few Candida tropicalis 7/7 urine >> negative 7/7  blood >> negative  7/9 wound >> negative 7/12 BAL >> candida tropicalis, candida albicans 7/14 BCID >> candida parapsilosis   7/14 BCx2 >> candida parapsilosis  7/17 BCx2 >> negative  7/21 BCx2 >> negative  7/22 Respiratory Cx >> candida 7/24 c diff >> neg  Antimicrobials:  Vanc 6/24>>7/13 Cefuroxime 6/24 >> 6/25 merrem 6/26 >> 7/14 Anidulafungin 7/8 >> 7/14 Zosyn 7/14 >>7/21 Diflucan 7/17 >>  Vanco 7/22 >>  Mero 7/22 >>  Interim history/subjective:  Having issues with desats on TC and PS trials intermittently due to secretions and weakness.  Objective   Blood pressure (!) 117/48, pulse 68, temperature 98 F (36.7 C), temperature source Oral, resp. rate 16, height _0  (1.753 m), weight 68.7 kg, SpO2 100 %.    Vent Mode: PRVC FiO2 (%):  [30 %-40 %] 40 % Set Rate:  [14 bmp] 14 bmp Vt Set:  [420 mL] 420 mL PEEP:  [5 cmH20] 5 cmH20 Pressure Support:  [15 cmH20] 15 cmH20 Plateau Pressure:  [13 cmH20] 13 cmH20   Intake/Output Summary (Last 24 hours) at 09/10/2019 0717 Last data filed at 09/10/2019 2694 Gross per 24 hour  Intake 2200 ml  Output 3065 ml  Net -865 ml   Filed Weights   09/08/19 0500 09/09/19 0500 09/10/19 0400  Weight: 67.7 kg 68.7 kg 68.7 kg   Exam GEN: jaundiced frail elderly man on vent HEENT: MM dry, tracheostomy CDI with thick bloody mild-moderate secretions CV: RRR, ext warm PULM: Scattered rhonci, triggering vent GI: Soft, +BS EXT: Muscle wasting, trace anasarca, ischemic changes on right finger, scattered bruising NEURO: moves all 4 ext to command, profoundly weak PSYCH: RASS -1 SKIN: Jandiced, bruising as above  Sodium improved 146>>141 Sugars high Alk phos, AST/ALT up Bili down WBC 15>>17 Sugars high All cultures from last two weeks neg   Lab Results  Component Value Date   WBC 17.7 (H) 09/10/2019   HGB 8.0 (L) 09/10/2019   HCT 29.1 (L) 09/10/2019   MCV 110.2 (H) 09/10/2019   PLT 221 09/10/2019   Lab Results  Component Value  Date   CREATININE 0.85 09/10/2019   BUN 76 (H) 09/10/2019   NA 141 09/10/2019   K 4.9 09/10/2019   CL 111 09/10/2019   CO2 19 (L) 09/10/2019   Resolved Hospital Problem list   Suspected HCAP  Assessment & Plan:   Cardiogenic/hemorrhagic shock status post CABG complicated by VT arrest S/p VA ECMO (decanulated 6/28). S/p impella placement (6/28> 7/9) -continue midodrine 30m TID -diuresis per CHF service  -follow I/O's  -post operative care per TCTS  Atrial flutter -continue amiodarone, ASA, Eliquis  -tele monitoring   Acute hypoxic respiratory failure w/ need for mechanical ventilation - status post tracheostomy Poor lung mechanics due to weakness, deconditioning   -continue xopenex -chest PT / mobilize -ATC as tolerated during day with QHS vent support at night -PRVC 8cc/kg as rest mode  -Chest tube removal today per TCTS  Deconditioning of Critical Illness  -PT / OT efforts   Hyperbilirubinemia due to acalculous cholecystitis Possible cirrhosis per 7/8 [place] Status post  percutaneous GB drain  Remains jaundiced -drain care per IR  -follow T.Bili, consider removal once normalized   DM II with fluctuant hypo/hyperglycemia -monitor glucose  -continue TF  -Added novolog TF coverage per diabetes team recs 8/2  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  -diflucan, planned stop date 8/10 -  appreciate ID assistance   Acute blood loss anemia and critical illness anemia Heparin-induced thrombocytopenia >> resolved, required bivalirudin -Continue eliquis  -follow CBC -transfuse for Hgb <7%, active bleeding   Acute kidney injury Hypernatremia -Trend BMP / urinary output -Replace electrolytes as indicated -Avoid nephrotoxic agents, ensure adequate renal perfusion -continue free water flushes  Acute Metabolic Encephalopathy  CTH without acute intracranial pathology. EEG negative 7/23.  -minimize sedating medications -PT  efforts  -delirium prevention measures  -continue thiamine   Sacral Decubitus  -appreciate WOC assistance   Small left adrenal mass seen on CT scan of the abdomen August 24, 2019 - OP f/u  Daily Goals Checklist  Pain/Anxiety/Delirium protocol (if indicated): fentanyl as needed only VAP protocol (if indicated): bundle in place.  DVT prophylaxis: eliquis Nutrition Status: TF  GI prophylaxis: PPI Urinary catheter: n/a Central lines: removed Glucose control: See above Mobility/therapy needs: PT for ROM Code Status: Full  Family Communication: per primary Disposition: ICU, Anticipate LTACH need.   The patient is critically ill with multiple organ systems failure and requires high complexity decision making for assessment and support, frequent evaluation and titration of therapies, application of advanced monitoring technologies and extensive interpretation of multiple databases. Critical Care Time devoted to patient care services described in this note independent of APP/resident time (if applicable)  is 33 minutes.   Erskine Emery MD [facility] 09/10/2019 7:22 AM Personal pager: [phone] If unanswered, please page CCM On-call: [phone]

## 2019-09-10 NOTE — Progress Notes (Signed)
CT surgery p.m. Rounds  Patient alert and breathing comfortably on ventilator with normal O2 saturation Had transient difficulty with drop in O2 sats earlier today possibly related to mucous plugging. Blood pressure (!) 122/52, pulse 81, temperature 98.8 F (37.1 C), temperature source Oral, resp. rate (!) 28, height 5\' 9"  (1.753 m), weight 68.7 kg, SpO2 100 %.

## 2019-09-10 NOTE — Progress Notes (Signed)
Patient was placed on trach collar by RT around 8am. Approx 10 mins later patient sounded very congested. RN suctioned trach - thick, pink tinged sputum. Patient's sats remained stable. RN went to supply room to get more suction catheters and upon return to room pt's O2 sats were mid 70's. Dr. Katrinka Blazing outside room and came inside to place pt back on vent. During this time, patient's HR dropped from 70's to 50's and was being manually bagged. He was then placed back on the vent and sats/HR improved.  Leanna Battles, RN

## 2019-09-10 NOTE — Evaluation (Addendum)
Occupational Therapy Evaluation Patient Details Name: NISHAN OVENS MRN: [id number] DOB: 19-Jan-1956 Today's Date: 09/10/2019    History of Present Illness Alphonsus Doyel is a 64 y.o. male presenting with shortness of breath; noted AKI, CHF, recent PNA, elevated troponin, pulm edema/effusion, and cardiomegaly.Marland Kitchen CABG 6/24 and intubated (extubated 6/25)--went into cardiac arrest on same date with re-opening of chest for heart massage and direct epicardial paddles and place on ECMO. 08/03/19 washout out for tamponade. 7/2 sternal closure and wound vac. 7/9 Impella removed.08/20/19 Tracheostomy 08/24/19 cholecystostomy tube. 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 PMHx: HFrEF, COPD, HTN, T2DM, HLD, MDD, chronic back pain, vitamin deficiency, BPH, tobacco use disorder, marijuana use disorder   Clinical Impression   Pt making gradual progress towards OT goals, tolerating activity at bed level and in modified chair egress. Pt able to perform simple grooming ADL task (face washing ) with RUE and minA today - noted increased ease of reaching towards face/head. Additional focus on AAROM to bil UE/LE within sternal precaution limits. Pt following simple commands given multimodal cues and intermittent repetition. Pt on trach/vent (FiO2 40%, PEEP 5) with SpO2 >92% throughout, BP start of session 110/68, end of session 121/63, HR maintaining in the 70s throughout. Feel POC remains appropriate at this time. Will continue to follow while acutely admitted.   Follow Up Recommendations  LTACH;Supervision/Assistance - 24 hour    Equipment Recommendations  Other (comment) (TBA)           Precautions / Restrictions Precautions Precautions: Fall;Sternal;Other (comment) Precaution Comments: vent, male pure wick, chole drain, left chest tube, sacral wound, trach Other Brace: B Prevalon Restrictions Weight Bearing Restrictions:  (sternal)      Mobility Bed Mobility Overal bed  mobility: Needs Assistance             General bed mobility comments: use of bed for modified chair position                            ADL either performed or assessed with clinical judgement   ADL Overall ADL's : Needs assistance/impaired     Grooming: Wash/dry face;Minimal assistance Grooming Details (indicate cue type and reason): using RUE to complete                                     Vision   Additional Comments: pt tracking towards therapist in both L and R visual fields      Perception     Praxis      Pertinent Vitals/Pain Pain Assessment: Faces Faces Pain Scale: Hurts even more Pain Location: "back", bottom/sacral region Pain Descriptors / Indicators: Grimacing Pain Intervention(s): Repositioned;Monitored during session;Limited activity within patient's tolerance     Hand Dominance     Extremity/Trunk Assessment             Communication     Cognition Arousal/Alertness: Awake/alert Behavior During Therapy: Flat affect Overall Cognitive Status: Difficult to assess Area of Impairment: Following commands;Attention;Memory;Safety/judgement;Orientation                 Orientation Level: Disoriented to;Time;Place;Situation Current Attention Level: Sustained   Following Commands: Follows one step commands inconsistently;Follows one step commands with increased time   Awareness: Intellectual Problem Solving: Slow processing;Decreased initiation;Difficulty sequencing;Requires verbal cues;Requires tactile cues General Comments: pt requiring multimodal commands for following simple commands - suspect  partly due to pt being HOH, smiling appropriately with therapist    General Comments       Exercises Exercises: General Upper Extremity;General Lower Extremity;Other exercises;Hand exercises General Exercises - Upper Extremity Shoulder Flexion: AAROM;Both;10 reps;Seated (within limits of sternal precautions) Elbow  Flexion: AAROM;Both;10 reps;Seated Elbow Extension: AAROM;Both;10 reps;Seated General Exercises - Lower Extremity Ankle Circles/Pumps: AAROM;Both;10 reps Straight Leg Raises: AAROM;Both;5 reps Hip Flexion/Marching: AAROM;Both;5 reps Hand Exercises Forearm Supination: AAROM;Both;5 reps Forearm Pronation: AAROM;Both;5 reps Other Exercises Other Exercises: gentle L scapular ROM including elevation/depression, protraction/retraction    Shoulder Instructions      Home Living                                          Prior Functioning/Environment                   OT Problem List:        OT Treatment/Interventions:      OT Goals(Current goals can be found in the care plan section) Acute Rehab OT Goals Patient Stated Goal: pt unable to state OT Goal Formulation: Patient unable to participate in goal setting Time For Goal Achievement: 09/16/19 Potential to Achieve Goals: Fair ADL Goals Pt Will Perform Grooming: with max assist;bed level;sitting Pt Will Perform Tub/Shower Transfer: with modified independence;Tub transfer;tub bench;3 in 1;rolling walker;ambulating Additional ADL Goal #1: Pt will follow one step commands 50% of time Additional ADL Goal #2: Pt will be able to look left and maintain for 30 seconds Additional ADL Goal #3: Family will be independent with Bil UE PROM exercise program  OT Frequency: Min 2X/week (will benefit from 3x/wk)   Barriers to D/C:            Co-evaluation              AM-PAC OT "6 Clicks" Daily Activity     Outcome Measure Help from another person eating meals?: Total Help from another person taking care of personal grooming?: A Lot Help from another person toileting, which includes using toliet, bedpan, or urinal?: Total Help from another person bathing (including washing, rinsing, drying)?: Total Help from another person to put on and taking off regular upper body clothing?: Total Help from another person to  put on and taking off regular lower body clothing?: Total 6 Click Score: 7   End of Session Equipment Utilized During Treatment: Oxygen Nurse Communication: Mobility status  Activity Tolerance: Patient tolerated treatment well Patient left: in bed;with call bell/phone within reach  OT Visit Diagnosis: Other abnormalities of gait and mobility (R26.89);Muscle weakness (generalized) (M62.81);Other symptoms and signs involving cognitive function;Pain Pain - Right/Left: Left Pain - part of body: Shoulder (and sacrum)                Time: 7510-2585 OT Time Calculation (min): 26 min Charges:  OT General Charges $OT Visit: 1 Visit OT Treatments $Self Care/Home Management : 8-22 mins $Therapeutic Activity: 8-22 mins  Marcy Siren, OT Acute Rehabilitation Services Pager [phone] Office [phone]   [name] 09/10/2019, 4:56 PM

## 2019-09-10 NOTE — Progress Notes (Signed)
Per Dr Shirlee Latch  Heart failure team will sign off as of 09/10/19  Please re-consult if needed.   Alexus Michael NP-C  11:29 AM

## 2019-09-10 NOTE — Progress Notes (Signed)
Inpatient Diabetes Program Recommendations  AACE/ADA: New Consensus Statement on Inpatient Glycemic Control (2015)  Target Ranges:  Prepandial:   less than 140 mg/dL      Peak postprandial:   less than 180 mg/dL (1-2 hours)      Critically ill patients:  140 - 180 mg/dL   Results for Todd [name], Todd [name] (MRN [id number]) as of 09/10/2019 07:11  Ref. Range 09/09/2019 00:03 09/09/2019 04:46 09/09/2019 07:27 09/09/2019 12:11 09/09/2019 15:35 09/09/2019 20:02 09/10/2019 01:00 09/10/2019 04:51  Glucose-Capillary Latest Ref Range: 70 - 99 mg/dL 573 (H)  4 units NOVOLOG  203 (H)  8 units NOVOLOG  194 (H)  4 units NOVOLOG  191 (H)  4 units NOVOLOG +  8 units LEVEMIR  199 (H)  4 units NOVOLOG  189 (H)  4 units NOVOLOG +  8 units LEVEMIR  227 (H)  8 units NOVOLOG  232 (H)  8 units NOVOLOG     Current Orders: Levemir 8 units BID      Novolog 0-24 units Q4 hours    MD- Note patient getting continuous tube feeds at 65cc/hr.  Having glucose elevations >200 at times.  Please consider adding Novolog Tube Feed Coverage:  Novolog 4 units Q4 hours  HOLD if tube feeds HELD for any reason    --Will follow patient during hospitalization--  Ambrose Finland RN, MSN, CDE Diabetes Coordinator Inpatient Glycemic Control Team Team Pager: [phone] (8a-5p)

## 2019-09-10 NOTE — Progress Notes (Signed)
Physical Therapy Wound Treatment Patient Details  Name: Todd [name] MRN: [id number] Date of Birth: Dec 06, 1955  Today's Date: 09/10/2019 Time: 1201-1230 Time Calculation (min): 29 min  Subjective  Subjective: unable (vent/trach), smiling intermittently Patient and Family Stated Goals: unable Date of Onset:  (unknown) Prior Treatments: foam dressing  Pain Score:   unable to state; no grimacing or changes in VS  Wound Assessment  Pressure Injury 08/09/19 Sacrum Right Unstageable - Full thickness tissue loss in which the base of the injury is covered by slough (yellow, tan, gray, green or brown) and/or eschar (tan, brown or black) in the wound bed. has evolved into unstageable whe (Active)  Wound Image   09/04/19 0946  Dressing Type ABD;Barrier Film (skin prep);Gauze (Comment);Moist to moist;Tape dressing;Other (Comment) 09/10/19 1235  Dressing Changed 09/10/19 1235  Dressing Change Frequency Daily 09/10/19 1235  State of Healing Non-healing 09/10/19 1235  Site / Wound Assessment Bleeding;Yellow;Pink 09/10/19 1235  % Wound base Red or Granulating 10% 09/10/19 1235  % Wound base Yellow/Fibrinous Exudate 90% 09/10/19 1235  % Wound base Black/Eschar 0% 09/10/19 1235  % Wound base Other/Granulation Tissue (Comment) 0% 09/08/19 1253  Peri-wound Assessment Erythema (blanchable);Intact;Pink 09/10/19 1235  Wound Length (cm) 8.6 cm 09/07/19 1258  Wound Width (cm) 8.7 cm 09/07/19 1258  Wound Depth (cm) 0.1 cm 09/07/19 1258  Wound Surface Area (cm^2) 74.82 cm^2 09/07/19 1258  Wound Volume (cm^3) 7.48 cm^3 09/07/19 1258  Margins Unattached edges (unapproximated) 09/10/19 1235  Drainage Amount Minimal 09/10/19 1235  Drainage Description Serosanguineous 09/10/19 1235  Treatment Debridement (Selective);Hydrotherapy (Pulse lavage);Packing (Saline gauze);Tape changed 09/10/19 1235     Wound / Incision (Open or Dehisced) 09/04/19 Buttocks Right;Medial lateral to sacral wound on rt buttock (Active)   Dressing Type ABD;Barrier Film (skin prep);Gauze (Comment);Moist to moist;Tape dressing 09/10/19 1235  Dressing Changed New 09/10/19 1235  Dressing Status Clean 09/10/19 1235  Dressing Change Frequency Daily 09/10/19 1235  Site / Wound Assessment Pale;Pink;Yellow 09/10/19 1235  % Wound base Red or Granulating 50% 09/10/19 1235  % Wound base Yellow/Fibrinous Exudate 50% 09/10/19 1235  % Wound base Black/Eschar 0% 09/04/19 0946  Peri-wound Assessment Erythema (blanchable) 09/10/19 1235  Wound Length (cm) 1.1 cm 09/04/19 0946  Wound Width (cm) 0.7 cm 09/04/19 0946  Wound Depth (cm) 0.1 cm 09/04/19 0946  Wound Volume (cm^3) 0.08 cm^3 09/04/19 0946  Wound Surface Area (cm^2) 0.77 cm^2 09/04/19 0946  Margins Unattached edges (unapproximated) 09/10/19 1235  Closure None 09/10/19 1235  Drainage Amount Minimal 09/10/19 1235  Drainage Description Serous 09/10/19 1235  Non-staged Wound Description Partial thickness 09/10/19 1235  Treatment Debridement (Selective);Hydrotherapy (Pulse lavage);Packing (Saline gauze) 09/10/19 1235      Hydrotherapy Pulsed lavage therapy - wound location: sacrum, rt buttock wound  Pulsed Lavage with Suction (psi): 12 psi Pulsed Lavage with Suction - Normal Saline Used: 1000 mL Pulsed Lavage Tip: Tip with splash shield Selective Debridement Selective Debridement - Location: sacrum Selective Debridement - Tools Used: Forceps;Scalpel;Scissors Selective Debridement - Tissue Removed: yellow/gray slough   Wound Assessment and Plan  Wound Therapy - Assess/Plan/Recommendations Wound Therapy - Clinical Statement: Able to remove upper layer of softened yellow necrotic tissue, however more adherent underlying yellow necrotic tissue remains. Pt continues to bleed easily with sharp debridement.  Wound Therapy - Functional Problem List: limited positioning options due to pressure wound Factors Delaying/Impairing Wound Healing: Diabetes  Mellitus;Incontinence;Immobility;Multiple medical problems Hydrotherapy Plan: Debridement;Dressing change;Patient/family education;Pulsatile lavage with suction Wound Therapy - Frequency: 6X / week Wound Therapy - Follow  Up Recommendations: Other (comment) (LTACH (has been denied, and remains appropriate)) Wound Plan: see above  Wound Therapy Goals- Improve the function of patient's integumentary system by progressing the wound(s) through the phases of wound healing (inflammation - proliferation - remodeling) by: Decrease Necrotic Tissue to: 50% Decrease Necrotic Tissue - Progress: Progressing toward goal Increase Granulation Tissue to: 50 Increase Granulation Tissue - Progress: Progressing toward goal Time For Goal Achievement: 7 days Wound Therapy - Potential for Goals: Fair  Goals will be updated until maximal potential achieved or discharge criteria met.  Discharge criteria: when goals achieved, discharge from hospital, MD decision/surgical intervention, no progress towards goals, refusal/missing three consecutive treatments without notification or medical reason.  GP     Todd [name] Todd [name] 09/10/2019, 2:45 PM   Arby Barrette, PT Pager [phone]

## 2019-09-10 NOTE — Consult Note (Signed)
WOC Nurse wound follow up Wound type:Unstageable pressure injury to sacrum Eschar to right first phalanx Measurement:sacrum:  9 cm x 9 cm unable to visualize wound bed. Continues with hydrotherapy and enzymatic debridement.  Devitalized tissue is softening.  Wound bed bleeds with assessment. 90% slough and 10% pink moist Wound bed:see above Drainage (amount, consistency, odor) moderate tan effluent with necrotic odor.  Periwound: Dressing procedure/placement/frequency: COntinue betadine to finger and santyl and hydrotherapy to sacral wound. Nares are newly epithelialized and open to air.  Will follow and assess weekly.  Maple Hudson MSN, RN, FNP-BC CWON Wound, Ostomy, Continence Nurse Pager [phone]

## 2019-09-10 NOTE — Progress Notes (Signed)
SLP Cancellation Note  Patient Details Name: Todd [name] MRN: [id number] DOB: [birth year]   Cancelled treatment:       Reason Eval/Treat Not Completed: Medical issues which prohibited therapy. SLP has been following pt since initial orders were placed on 7/12. Those orders were cancelled and new orders placed by Brandi, NP on 8/1, presumably after pt was on trach collar. Pt was also briefly on trach collar this morning but was placed back on the vent after O2 sats and HR dropped. SLP will follow up.   Elisabeth Strom I. Vear Clock, MS, CCC-SLP Acute Rehabilitation Services Office number [phone] Pager [phone]  Scheryl Marten 09/10/2019, 1:07 PM

## 2019-09-11 ENCOUNTER — Inpatient Hospital Stay (HOSPITAL_COMMUNITY): Payer: Medicare Other

## 2019-09-11 LAB — CBC
HCT: 24.8 % — ABNORMAL LOW (ref 39.0–52.0)
Hemoglobin: 7.1 g/dL — ABNORMAL LOW (ref 13.0–17.0)
MCH: 30.5 pg (ref 26.0–34.0)
MCHC: 28.6 g/dL — ABNORMAL LOW (ref 30.0–36.0)
MCV: 106.4 fL — ABNORMAL HIGH (ref 80.0–100.0)
Platelets: 247 10*3/uL (ref 150–400)
RBC: 2.33 MIL/uL — ABNORMAL LOW (ref 4.22–5.81)
RDW: 19.8 % — ABNORMAL HIGH (ref 11.5–15.5)
WBC: 13.2 10*3/uL — ABNORMAL HIGH (ref 4.0–10.5)
nRBC: 0 % (ref 0.0–0.2)

## 2019-09-11 LAB — BASIC METABOLIC PANEL
Anion gap: 10 (ref 5–15)
BUN: 76 mg/dL — ABNORMAL HIGH (ref 8–23)
CO2: 25 mmol/L (ref 22–32)
Calcium: 7.9 mg/dL — ABNORMAL LOW (ref 8.9–10.3)
Chloride: 105 mmol/L (ref 98–111)
Creatinine, Ser: 0.91 mg/dL (ref 0.61–1.24)
GFR calc Af Amer: >60 mL/min (ref >60)
GFR calc non Af Amer: >60 mL/min (ref >60)
Glucose, Bld: 91 mg/dL (ref 70–99)
Potassium: 4.1 mmol/L (ref 3.5–5.1)
Sodium: 140 mmol/L (ref 135–145)

## 2019-09-11 LAB — GLUCOSE, CAPILLARY
Glucose-Capillary: 86 mg/dL (ref 70–99)
Glucose-Capillary: 86 mg/dL (ref 70–99)
Glucose-Capillary: 68 mg/dL — ABNORMAL LOW (ref 70–99)
Glucose-Capillary: 68 mg/dL — ABNORMAL LOW (ref 70–99)
Glucose-Capillary: 140 mg/dL — ABNORMAL HIGH (ref 70–99)
Glucose-Capillary: 159 mg/dL — ABNORMAL HIGH (ref 70–99)
Glucose-Capillary: 195 mg/dL — ABNORMAL HIGH (ref 70–99)

## 2019-09-11 LAB — MAGNESIUM: Magnesium: 2.3 mg/dL (ref 1.7–2.4)

## 2019-09-11 LAB — PHOSPHORUS: Phosphorus: 4.5 mg/dL (ref 2.5–4.6)

## 2019-09-11 LAB — BILIRUBIN, TOTAL: Total Bilirubin: 4.8 mg/dL — ABNORMAL HIGH (ref 0.3–1.2)

## 2019-09-11 MED ORDER — ALPRAZOLAM 0.5 MG PO TABS: 0.5000 mg | ORAL_TABLET | Freq: Three times a day (TID) | ORAL | Status: DC | PRN

## 2019-09-11 MED ORDER — PIVOT 1.5 CAL PO LIQD
1000.0000 mL | ORAL | Status: DC
  Administered 2019-09-12 – 2019-10-09 (×30): 1000 mL
  Filled 2019-09-11 (×51): qty 1000

## 2019-09-11 MED ORDER — TRAZODONE HCL 50 MG PO TABS
50.0000 mg | ORAL_TABLET | Freq: Every evening | ORAL | Status: DC | PRN
  Administered 2019-09-14 – 2019-10-08 (×7): 50 mg
  Filled 2019-09-11 (×8): qty 1

## 2019-09-11 MED ORDER — INSULIN DETEMIR 100 UNIT/ML ~~LOC~~ SOLN
12.0000 [IU] | Freq: Two times a day (BID) | SUBCUTANEOUS | Status: DC
  Administered 2019-09-11 – 2019-09-21 (×21): 12 [IU] via SUBCUTANEOUS
  Filled 2019-09-11 (×24): qty 0.12

## 2019-09-11 MED ORDER — QUETIAPINE FUMARATE 25 MG PO TABS
25.0000 mg | ORAL_TABLET | Freq: Two times a day (BID) | ORAL | Status: DC
  Administered 2019-09-11 (×2): 25 mg via ORAL
  Filled 2019-09-11 (×2): qty 1

## 2019-09-11 NOTE — Progress Notes (Signed)
SLP Cancellation Note  Patient Details Name: ROSE HIPPLER MRN: [id number] DOB: 03-26-1955   Cancelled treatment:       Reason Eval/Treat Not Completed: Medical issues which prohibited therapy; Pt remains on ventilator, per RN had difficulty with weaning yesterday. Will continue to monitor for readiness for PMSV   Ardis Rowan Andoni Busch MA, CCC-SLP  09/11/2019, 3:33 PM

## 2019-09-11 NOTE — Progress Notes (Signed)
Physical Therapy Wound Treatment Patient Details  Name: Todd [name] MRN: [id number] Date of Birth: 06-16-1955  Today's Date: 09/11/2019 Time:  -     Subjective  Subjective: unable (vent/trach), smiling intermittently Patient and Family Stated Goals: unable Date of Onset:  (unknown) Prior Treatments: foam dressing  Pain Score:   unable to state due to trach; none noted during PLS; +wincing with rolling/positioning  Wound Assessment  Pressure Injury 08/09/19 Sacrum Right Unstageable - Full thickness tissue loss in which the base of the injury is covered by slough (yellow, tan, gray, green or brown) and/or eschar (tan, brown or black) in the wound bed. has evolved into unstageable whe (Active)  Wound Image   09/11/19 1625  Dressing Type ABD;Barrier Film (skin prep);Gauze (Comment);Moist to moist;Other (Comment) 09/11/19 1625  Dressing Changed 09/11/19 1625  Dressing Change Frequency Daily 09/11/19 1625  State of Healing Non-healing 09/11/19 1625  Site / Wound Assessment Bleeding;Granulation tissue;Pale;Yellow 09/11/19 1625  % Wound base Red or Granulating 10% 09/11/19 1625  % Wound base Yellow/Fibrinous Exudate 90% 09/11/19 1625  % Wound base Black/Eschar 0% 09/10/19 1235  % Wound base Other/Granulation Tissue (Comment) 0% 09/08/19 1253  Peri-wound Assessment Erythema (blanchable) 09/11/19 1625  Wound Length (cm) 8.6 cm 09/07/19 1258  Wound Width (cm) 8.7 cm 09/07/19 1258  Wound Depth (cm) 0.1 cm 09/07/19 1258  Wound Surface Area (cm^2) 74.82 cm^2 09/07/19 1258  Wound Volume (cm^3) 7.48 cm^3 09/07/19 1258  Margins Unattached edges (unapproximated) 09/11/19 1625  Drainage Amount Minimal 09/11/19 1625  Drainage Description Serosanguineous 09/11/19 1625  Treatment Debridement (Selective);Hydrotherapy (Pulse lavage);Packing (Saline gauze) 09/11/19 1625     Wound / Incision (Open or Dehisced) 09/04/19 Buttocks Right;Medial lateral to sacral wound on rt buttock (Active)  Dressing Type  ABD;Barrier Film (skin prep);Gauze (Comment);Moist to moist;Tape dressing;Other (Comment) 09/11/19 1625  Dressing Changed Changed 09/11/19 1625  Dressing Status Clean;Dry;Intact 09/11/19 1625  Dressing Change Frequency Daily 09/11/19 1625  Site / Wound Assessment Pale;Pink;Yellow;Bleeding 09/11/19 1625  % Wound base Red or Granulating 50% 09/11/19 1625  % Wound base Yellow/Fibrinous Exudate 50% 09/11/19 1625  % Wound base Black/Eschar 0% 09/04/19 0946  Peri-wound Assessment Erythema (blanchable) 09/11/19 1625  Wound Length (cm) 1.1 cm 09/04/19 0946  Wound Width (cm) 0.7 cm 09/04/19 0946  Wound Depth (cm) 0.1 cm 09/04/19 0946  Wound Volume (cm^3) 0.08 cm^3 09/04/19 0946  Wound Surface Area (cm^2) 0.77 cm^2 09/04/19 0946  Margins Unattached edges (unapproximated) 09/11/19 1625  Closure None 09/11/19 1625  Drainage Amount Minimal 09/11/19 1625  Drainage Description Serosanguineous 09/11/19 1625  Non-staged Wound Description Partial thickness 09/11/19 1625  Treatment Debridement (Selective);Hydrotherapy (Pulse lavage);Packing (Saline gauze);Tape changed 09/11/19 1625  *Note-new picture taken, however new measurements were accidentally not obtained. Will measure 8/4.   Hydrotherapy Pulsed lavage therapy - wound location: sacrum, rt buttock wound  Pulsed Lavage with Suction (psi): 12 psi Pulsed Lavage with Suction - Normal Saline Used: 1000 mL Pulsed Lavage Tip: Tip with splash shield Selective Debridement Selective Debridement - Location: sacrum Selective Debridement - Tools Used: Forceps;Scalpel Selective Debridement - Tissue Removed: yellow/gray slough   Wound Assessment and Plan  Wound Therapy - Assess/Plan/Recommendations Wound Therapy - Clinical Statement: Able to remove additional softened yellow necrotic tissue, however more adherent underlying yellow necrotic tissue remains. Pt continues to bleed easily with sharp debridement.  Wound Therapy - Functional Problem List: limited  positioning options due to pressure wound Factors Delaying/Impairing Wound Healing: Diabetes Mellitus;Incontinence;Immobility;Multiple medical problems Hydrotherapy Plan: Debridement;Dressing change;Patient/family education;Pulsatile lavage  with suction Wound Therapy - Frequency: 6X / week Wound Therapy - Follow Up Recommendations: Other (comment) (LTACH (has been denied, and remains appropriate)) Wound Plan: see above  Wound Therapy Goals- Improve the function of patient's integumentary system by progressing the wound(s) through the phases of wound healing (inflammation - proliferation - remodeling) by: Decrease Necrotic Tissue to: 50% Decrease Necrotic Tissue - Progress: Progressing toward goal Increase Granulation Tissue to: 50 Increase Granulation Tissue - Progress: Progressing toward goal Time For Goal Achievement: 7 days Wound Therapy - Potential for Goals: Fair  Goals will be updated until maximal potential achieved or discharge criteria met.  Discharge criteria: when goals achieved, discharge from hospital, MD decision/surgical intervention, no progress towards goals, refusal/missing three consecutive treatments without notification or medical reason.  GP     Arby Barrette, PT Pager [phone]  Rexanne Mano 09/11/2019, 4:33 PM

## 2019-09-11 NOTE — Progress Notes (Signed)
25 Days Post-Op Procedure(s) (LRB): REMOVAL OF IMPELLA LEFT VENTRICULAR ASSIST DEVICE (N/A) TRANSESOPHAGEAL ECHOCARDIOGRAM (TEE) (N/A) Subjective: C/o pain @ chest incision this AM  Objective: Vital signs in last 24 hours: Temp:  [98.1 F (36.7 C)-98.8 F (37.1 C)] 98.5 F (36.9 C) (08/03 0335) Pulse Rate:  [60-81] 72 (08/03 0700) Cardiac Rhythm: Normal sinus rhythm (08/02 2000) Resp:  [7-29] 22 (08/03 0700) BP: (88-139)/(36-101) 88/54 (08/03 0703) SpO2:  [44 %-100 %] 100 % (08/03 0700) FiO2 (%):  [35 %-40 %] 40 % (08/03 0404) Weight:  [68.8 kg] 68.8 kg (08/03 0335)  Hemodynamic parameters for last 24 hours:    Intake/Output from previous day: 08/02 0701 - 08/03 0700 In: 5470 [I.V.:209.9; NG/GT:4740; IV Piggyback:350.1] Out: 3115 [Urine:2525; Drains:420; Stool:50; Chest Tube:120] Intake/Output this shift: No intake/output data recorded.  General appearance: alert, cooperative and uncomfortable Neurologic: intact Heart: regular rate and rhythm Lungs: clear to auscultation bilaterally Abdomen: normal findings: soft, non-tender Wound: intact  Lab Results: Recent Labs    09/10/19 0145 09/11/19 0133  WBC 17.7* 13.2*  HGB 8.0* 7.1*  HCT 29.1* 24.8*  PLT 221 247   BMET:  Recent Labs    09/10/19 0145 09/11/19 0133  NA 141 140  K 4.9 4.1  CL 111 105  CO2 19* 25  GLUCOSE 237* 91  BUN 76* 76*  CREATININE 0.85 0.91  CALCIUM 8.1* 7.9*    PT/INR: No results for input(s): LABPROT, INR in the last 72 hours. ABG    Component Value Date/Time   PHART 7.416 08/29/2019 1440   HCO3 25.7 08/29/2019 1440   TCO2 27 08/29/2019 1440   ACIDBASEDEF 1.0 08/08/2019 1115   O2SAT 67.1 09/02/2019 0234   CBG (last 3)  Recent Labs    09/10/19 2332 09/11/19 0334 09/11/19 0727  GLUCAP 109* 86 68*    Assessment/Plan: S/P Procedure(s) (LRB): REMOVAL OF IMPELLA LEFT VENTRICULAR ASSIST DEVICE (N/A) TRANSESOPHAGEAL ECHOCARDIOGRAM (TEE) (N/A) -NEURO- alert, nonfocal exam Cv-  in SR on amiodarone RESP_ VDRF, trach collar until 7 PM yesterday, rested on vent overnight RENAL- continue Lasix ENDO-- CBG a little low this AM, will decrease levemir GI- perc chole tube in place, still jaundiced Nutrition- tolerating Tf for severe protein calorie malnutrition ID- afebrile, WBC down to 13K this AM  On vancomycin and diflucan Deconditioning- PT/OT     LOS: 47 days    [name] 09/11/2019

## 2019-09-11 NOTE — Progress Notes (Signed)
EVENING ROUNDS NOTE :     [address]       [place] [id number]             [phone]                 25 Days Post-Op Procedure(s) (LRB): REMOVAL OF IMPELLA LEFT VENTRICULAR ASSIST DEVICE (N/A) TRANSESOPHAGEAL ECHOCARDIOGRAM (TEE) (N/A)   Total Length of Stay:  LOS: 47 days  Events:   No events today No TC.  Remains on full vent support ? Short episode of VT while pt was tapping his chest.  Stable all day otherwise    BP (!) 111/54   Pulse 79   Temp 98.3 F (36.8 C) (Oral)   Resp (!) 21   Ht 5\' 9"  (1.753 m)   Wt 68.8 kg   SpO2 100%   BMI 22.40 kg/m      Vent Mode: PRVC FiO2 (%):  [40 %] 40 % Set Rate:  [14 bmp] 14 bmp Vt Set:  [420 mL] 420 mL PEEP:  [5 cmH20] 5 cmH20 Pressure Support:  [5 cmH20-8 cmH20] 5 cmH20 Plateau Pressure:  [11 cmH20-23 cmH20] 16 cmH20  . sodium chloride 10 mL/hr at 09/11/19 1600  . feeding supplement (PIVOT 1.5 CAL) 70 mL/hr at 09/11/19 1619  . fluconazole (DIFLUCAN) IV Stopped (09/11/19 0342)  . vancomycin Stopped (09/11/19 1507)    I/O last 3 completed shifts: In: 5740 [I.V.:214.9; Other:170; 11/11/19; IV Piggyback:550.1] Out: 4765 [Urine:3910; Drains:515; Stool:100; Chest Tube:240]   CBC Latest Ref Rng & Units 09/11/2019 09/10/2019 09/09/2019  WBC 4.0 - 10.5 K/uL 13.2(H) 17.7(H) 15.0(H)  Hemoglobin 13.0 - 17.0 g/dL 7.1(L) 8.0(L) 7.9(L)  Hematocrit 39 - 52 % 24.8(L) 29.1(L) 28.4(L)  Platelets 150 - 400 K/uL 247 221 255    BMP Latest Ref Rng & Units 09/11/2019 09/10/2019 09/09/2019  Glucose 70 - 99 mg/dL 91 11/09/2019) 621(H)  BUN 8 - 23 mg/dL 086(V) 78(I) 69(G)  Creatinine 0.61 - 1.24 mg/dL 29(B 2.84 1.32  Sodium 135 - 145 mmol/L 140 141 146(H)  Potassium 3.5 - 5.1 mmol/L 4.1 4.9 4.7  Chloride 98 - 111 mmol/L 105 111 115(H)  CO2 22 - 32 mmol/L 25 19(L) 22  Calcium 8.9 - 10.3 mg/dL 7.9(L) 8.1(L) 8.2(L)    ABG    Component Value Date/Time   PHART 7.416 08/29/2019 1440   PCO2ART 40.4 08/29/2019 1440   PO2ART 132  (H) 08/29/2019 1440   HCO3 25.7 08/29/2019 1440   TCO2 27 08/29/2019 1440   ACIDBASEDEF 1.0 08/08/2019 1115   O2SAT 67.1 09/02/2019 0234       09/04/2019, MD 09/11/2019 5:38 PM

## 2019-09-11 NOTE — Plan of Care (Signed)
Stable during shift. Afebrile. Rested on vent, sats stable. Alert and interactive, intermittent anxiety - responds well to emotional support and encouragement. Fentanyl IV x2 for discomfort. Trazadone for sleep, slept about 3 hr. Tolerating tube feeds/free water flushes. CBG better controlled. Condom cath, good output. Flexiseal for diarrhea. Biliary drain with 420 mL/24 hr, flushing per orders.   PT following for sacral decub treatment. Q 2 turns, lateral only.   Problem: Clinical Measurements: Goal: Ability to maintain clinical measurements within normal limits will improve Outcome: Progressing Goal: Will remain free from infection Outcome: Progressing   Goal: Diagnostic test results will improve Outcome: Progressing Goal: Respiratory complications will improve Outcome: Progressing Goal: Cardiovascular complication will be avoided Outcome: Progressing   Problem: Nutrition: Goal: Adequate nutrition will be maintained Outcome: Progressing   Problem: Coping: Goal: Level of anxiety will decrease Outcome: Progressing   Problem: Elimination: Goal: Will not experience complications related to urinary retention Outcome: Progressing   Problem: Pain Managment: Goal: General experience of comfort will improve Outcome: Progressing   Problem: Safety: Goal: Ability to remain free from injury will improve Outcome: Progressing

## 2019-09-11 NOTE — Progress Notes (Signed)
Physical Therapy Treatment Patient Details Name: Todd [name] MRN: [id number] DOB: 01-26-1956 Today's Date: 09/11/2019    History of Present Illness Todd [name] is a 64 y.o. male presenting with shortness of breath; noted AKI, CHF, recent PNA, elevated troponin, pulm edema/effusion, and cardiomegaly.Marland Kitchen CABG 6/24 and intubated (extubated 6/25)--went into cardiac arrest on same date with re-opening of chest for heart massage and direct epicardial paddles and place on ECMO. 08/03/19 washout out for tamponade. 7/2 sternal closure and wound vac. 7/9 Impella removed.08/20/19 Tracheostomy 08/24/19 cholecystostomy tube. 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 PMHx: HFrEF, COPD, HTN, T2DM, HLD, MDD, chronic back pain, vitamin deficiency, BPH, tobacco use disorder, marijuana use disorder    PT Comments    Pt smiling, nodding, giving thumbs up. Pt remains disoriented to time, situation and precautions with impaired balance and function. Pt was able to tolerate standing without knee buckling for increased time today and would greatly benefit from Kreg tilt bed with MD aware and ordered. Pt remains unable to follow commands to actively participate in HEP and weight shifting. Will continue to work towards improved strength and balance.   CPAP FiO2 40% with SpO2 100%, peep 5 123/52 (71) pre activity 128/52 (71) post activity    Follow Up Recommendations  LTACH;Supervision/Assistance - 24 hour     Equipment Recommendations  None recommended by PT    Recommendations for Other Services       Precautions / Restrictions Precautions Precautions: Fall;Sternal;Other (comment) Precaution Comments: vent, condom cath, chole drain, left chest tube, sacral wound, trach, flexiseal    Mobility  Bed Mobility Overal bed mobility: Needs Assistance Bed Mobility: Rolling;Supine to Sit;Sit to Supine Rolling: Max assist   Supine to sit: Total assist;+2 for physical assistance Sit to  supine: Total assist;+2 for physical assistance   General bed mobility comments: roll bil for pericare and linen change, total +2 to slide toward HOB. Bed egress function to transition to and from sitting with max assist to scoot hips toward edge of surface  Transfers Overall transfer level: Needs assistance   Transfers: Sit to/from Stand Sit to Stand: Max assist;+2 physical assistance         General transfer comment: max +2 with bil knees blocked and pad cradling sacrum x 2 attempts. pt engaging quads but maintains flexed trunk with improved knee extension today and no buckling. PHysical assist to extend hips and trunk. Stood grossly 15-20 sec each trial  Ambulation/Gait             General Gait Details: unable   Social research officer, government Rank[employer] (Stroke Patients Only)       Balance Overall balance assessment: Needs assistance Sitting-balance support: No upper extremity supported;Feet supported Sitting balance-Leahy Scale: Zero Sitting balance - Comments: pt with max assist to sitting balance with tendency for right lean posteriorly and anteriorly   Standing balance support: Bilateral upper extremity supported Standing balance-Leahy Scale: Zero Standing balance comment: bil trunk and sacral support, knees blocked, dependent on 2 person assist                            Cognition Arousal/Alertness: Awake/alert Behavior During Therapy: Flat affect Overall Cognitive Status: Difficult to assess Area of Impairment: Following commands;Attention;Memory;Safety/judgement;Orientation                 Orientation Level:  Disoriented to;Time;Place;Situation Current Attention Level: Sustained   Following Commands: Follows one step commands inconsistently;Follows one step commands with increased time Safety/Judgement: Decreased awareness of safety;Decreased awareness of deficits Awareness: Intellectual Problem Solving: Slow  processing;Decreased initiation;Difficulty sequencing;Requires verbal cues;Requires tactile cues General Comments: pt not following commands for bil LE HEP despite multimodal cueing. Responding to orientation questions although inaccurate. continues to smile consistently and demonstrate lack of awareness coupled with [facility]      Exercises General Exercises - Lower Extremity Long Arc Quad: PROM;Both;Seated;10 reps Hip Flexion/Marching: Both;PROM;10 reps;Seated    General Comments        Pertinent Vitals/Pain Pain Assessment: Faces Faces Pain Scale: Hurts little more Pain Location: sacrum with scooting to edge of chair egress Pain Descriptors / Indicators: Grimacing Pain Intervention(s): Limited activity within patient's tolerance;Monitored during session;Repositioned    Home Living                      Prior Function            PT Goals (current goals can now be found in the care plan section) Progress towards PT goals: Progressing toward goals (slow, limited progression)    Frequency    Min 2X/week      PT Plan Current plan remains appropriate    Co-evaluation              AM-PAC PT "6 Clicks" Mobility   Outcome Measure  Help needed turning from your back to your side while in a flat bed without using bedrails?: Total Help needed moving from lying on your back to sitting on the side of a flat bed without using bedrails?: Total Help needed moving to and from a bed to a chair (including a wheelchair)?: Total Help needed standing up from a chair using your arms (e.g., wheelchair or bedside chair)?: Total Help needed to walk in hospital room?: Total Help needed climbing 3-5 steps with a railing? : Total 6 Click Score: 6    End of Session   Activity Tolerance: Patient tolerated treatment well Patient left: with call bell/phone within reach;in bed;with bed alarm set (in semichair position in bed with pillow under left hip) Nurse Communication: Mobility  status;Need for lift equipment;Other (comment) (order for tilt bed) PT Visit Diagnosis: Unsteadiness on feet (R26.81);Muscle weakness (generalized) (M62.81);Difficulty in walking, not elsewhere classified (R26.2)     Time: 1607-3710 PT Time Calculation (min) (ACUTE ONLY): 31 min  Charges:  $Therapeutic Activity: 23-37 mins                     Orlen Leedy P, PT Acute Rehabilitation Services Pager: [phone] Office: [phone]    Brently Voorhis B Abie Cheek 09/11/2019, 1:05 PM

## 2019-09-11 NOTE — Progress Notes (Signed)
NAME:  Todd [name], MRN:  [id number], DOB:  10-30-1955, LOS: 74 ADMISSION DATE:  07/26/2019, CONSULTATION DATE:  08/03/19 REFERRING MD:  Roxan Hockey, CHIEF COMPLAINT:  ECMO   Brief History   64 yo male admitted with CHF exacerbation, found to have multi-vessel disease requiring CABG 6/24, post op VT Arrest 6/25 and cannulated for VA ECMO.    History of present illness   Presented with worsening dyspnea 6/17 c/w CHF exacerbation.  LHC  with severe triple vessel disease.  Underwent CABG 6/24. Vtach arrest 6/25.  Chest opened bedside and cardiac massage initiated as well as multiple cardioversions, amiodarone, bicarb etc.  Brought to OR and cannulated for VA ECMO.  PCCM consulted to assist with management  Comorbidities include DM, heavy smoking, COPD  Past Medical History  Depression Ischemic cardiomyopathy HTN HLD  Significant Hospital Events   6/24 CABG 6/25 VA cannulation 6/28 decannulated and impella placed 6/29 bedside re-exploration; s/p decannulation. Placement of impella device originally at p8 but decreased to p4 2/2 suction events overnight up to p6. Echo completed and repositioned device. Remained on considerable support with 8 epi and 46 norepi vaso 0.05. continued chest tube output with large clots noted. Heparin thru device. Replacement products ongoing. 60% 8 peep 6/30 bedside mediastinal re-exploration and clean out of hematoma with tamponade and worsening hemodynamics. Pt had large volume transfusion. rebolused with amio 2/2 nsvt episode.  Weight up 48 pounds.  Started diuresis, Lasix drip started. 7/01 iatrogenic respiratory alkalosis, vent rate decreased. 7/02 No significant issues overnight remains on pressors and Impella not tolerating tube feeds with high gastric output awaiting core track placement 7/03 started trickle TF  7/05 Swab removed, CVL and PICC placed. 7/13 Trach and BAL 7/16 Awake and tracking. No follow commands. Still having fevers. Successful  ultrasound and CT guided placement of a 10.2 French cholecystostomy tube. A small amount of aspirated bile was capped and sent to the laboratory for analysis 7/17 Off sedation, PSV.  Beginning to follow some commands occasionally intermittently.  Very jaundiced.  Growing Candida in the blood and BAL > diflucan.   7/21 Ongoing fevers, WBC increased 7/22 Persistent fevers, pan cultured with CT Chest/Head. Suspected source tracheobronchitis.  7/28 Transitioned to eliquis, jaundiced but labs improving  7/29 Agitation/restless.  Trazodone added QHS. Weaning midodrine, attempting diuresis  7/30 Increased Na, weaning on PSV.  8/01 On 40% ATC, significantly improved mental status  Consults:  Advanced heart failure, PCCM, TCTS  Procedures:  LUE PICC 7/5 >>7/21 R Brachial ALine 7/9 >>7/27 Chest Tubes >> L IJ CVC 7/21 >>7/27 Peg tube 7/27 >>  Significant Diagnostic Tests:  6/22 spirometry with restrictive physiology, preserved FEV1/FVC 6/18 echo: LVEF 44-01%, grade I diastolic dysfunction 0/27 US Abdomen cholelithiasis without cholecystitis. Mild wall thinking in setting of liver disease and ascites.  7/10 LE [place] negative for DVT  7/20 CT ABD w contrast >> stable position of the percutaneous cholecystostomy tube with complete decompression of the gallbladder, trace residual free fluid within the RUQ, decreased since prior, trace bilateral pleural effusions L>R, scattered areas of ground glass airspace disease within the RML, RLL 7/22 CT Chest w/o Contrast >> Retrosternal fluid collection, non specific.  Bilateral pleural effusions, portions loculated.   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 Stable bilateral mastoid and middle ear effusions.   Micro Data:  COVID and HIV 6/17 >> Neg 7/3 respiratory >> few Candida tropicalis 7/5 blood >> negative  7/5  respiratory >> few Candida tropicalis 7/7 urine >> negative 7/7  blood >> negative  7/9 wound >> negative 7/12 BAL >> candida tropicalis, candida albicans 7/14 BCID >> candida parapsilosis   7/14 BCx2 >> candida parapsilosis  7/17 BCx2 >> negative  7/21 BCx2 >> negative  7/22 Respiratory Cx >> candida 7/24 c diff >> neg  Antimicrobials:  Vanc 6/24>>7/13 Cefuroxime 6/24 >> 6/25 merrem 6/26 >> 7/14 Anidulafungin 7/8 >> 7/14 Zosyn 7/14 >>7/21 Diflucan 7/17 >>  Vanco 7/22 >>  Mero 7/22 >>  Interim history/subjective:  Drop in hgb from 8 to 7.1  Objective   Blood pressure (!) 119/47, pulse 72, temperature 98.5 F (36.9 C), temperature source Oral, resp. rate (!) 23, height _0  (1.753 m), weight 68.8 kg, SpO2 100 %.    Vent Mode: CPAP;PSV FiO2 (%):  [40 %] 40 % Set Rate:  [14 bmp] 14 bmp Vt Set:  [420 mL] 420 mL PEEP:  [5 cmH20] 5 cmH20 Pressure Support:  [8 cmH20] 8 cmH20 Plateau Pressure:  [11 cmH20-24 cmH20] 21 cmH20   Intake/Output Summary (Last 24 hours) at 09/11/2019 5427 Last data filed at 09/11/2019 0700 Gross per 24 hour  Intake 4489.95 ml  Output 3115 ml  Net 1374.95 ml   Filed Weights   09/09/19 0500 09/10/19 0400 09/11/19 0335  Weight: 68.7 kg 68.7 kg 68.8 kg   Exam GEN: Chronically ill, frail appearing, older adult M trach/vent NAD  HEENT: tracheostomy c/d without secretions. Temporal muscle wasting. Pink mm  CV: RRR Cap refill < 3. 1+ peripheral pulses  PULM: CTA anteriorly. Symmetrical chest expansion. Even, unlabored on vent  GI: Soft flat +bs  EXT: Symmetrical bulk and tone. R finger dressing intact. BLE boots  NEURO: Awake alert mouthing words. Following commands. Generalized weakness  PSYCH: Calm. Somewhat flat affect  SKIN: Jaundice. Scattered ecchymosis. C/d/w  Lab Results  Component Value Date   WBC 13.2 (H) 09/11/2019   HGB 7.1 (L) 09/11/2019   HCT 24.8 (L) 09/11/2019   MCV 106.4 (H) 09/11/2019   PLT 247 09/11/2019   Lab Results  Component Value Date   CREATININE 0.91 09/11/2019   BUN 76 (H)  09/11/2019   NA 140 09/11/2019   K 4.1 09/11/2019   CL 105 09/11/2019   CO2 25 09/11/2019   Resolved Hospital Problem list   Suspected HCAP Acute kidney injury, improved  Hypernatremia, improved  Acute Metabolic Encephalopathy  -CTH without acute intracranial pathology. EEG negative 7/23.  Shock  Assessment & Plan:   Anxiety -likely ICU related stress disorder -low dose BID seroquel added  Acute Metabolic Encephalopathy, improved  -CTH without acute intracranial pathology. EEG negative 7/23.  -delirium precautions. -B-1    Acute hypoxic respiratory failure w/ need for mechanical ventilation - status post tracheostomy Poor lung mechanics due to weakness, deconditioning   -mucus plug + desat 8/2 requiring BVM  + full vent support P -PSV/CPAP 8/3, move back toward ATC during day and qHS vent -PRVC 8cc/kg as rest mode  -continue xopenex -CPT, mobility  Cardiogenic/hemorrhagic shock status post CABG complicated by VT arrest, improved S/p VA ECMO (decanulated 6/28). S/p impella placement (6/28> 7/9) -continue midodrine -diuresis per CHF service  -follow I/O's  -post operative care per TCTS   Atrial flutter -continue amiodarone, ASA, Eliquis  -tele monitoring    Hyperbilirubinemia due to acalculous cholecystitis Possible cirrhosis per 7/8 [place]  Status post  percutaneous GB drain  Remains jaundiced -drain care per IR  -follow T.Bili, consider removal once  normalized   DM II with fluctuant hypo/hyperglycemia -8/3 downtrending CBG >100 to 80 to 68 after increase of basal and addition of q4hr EN coverage  -monitor glucose  -continue TF  -BID levemir + rSSI (decreasing levemir from 15u BID to 12uBID and dc scheduled 4u novolog EN coverage 09/11/19)  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  -diflucan, planned stop date 8/10 -appreciate ID assistance   Acute blood loss anemia and critical illness anemia -8/3 drop in hgb from  8 to 7.1.  Heparin-induced thrombocytopenia >> resolved, required bivalirudin P -Continue eliquis  -follow CBC closely -transfuse for Hgb <7%, active bleeding   Acute kidney injury, improved  Hypernatremia, improved  -Trend BMP / urinary output -Replace electrolytes as indicated -Avoid nephrotoxic agents, ensure adequate renal perfusion -continue free water flushes  Sacral Decubitus  -appreciate WOC assistance    Deconditioning of Critical Illness  -PT / OT efforts  Small left adrenal mass seen on CT scan of the abdomen August 24, 2019 - OP f/u  Daily Goals Checklist  Pain/Anxiety/Delirium protocol (if indicated): adding seroquel 8/3 VAP protocol (if indicated): bundle in place.  DVT prophylaxis: eliquis Nutrition Status: TF  GI prophylaxis: PPI Urinary catheter: n/a Central lines: removed Glucose control: See above Mobility/therapy needs: PT for ROM  Code Status: Full  Family Communication: per primary Disposition: ICU -- likely will need LTACH   CRITICAL CARE Performed by: Cristal Generous   Total critical care time: 35 minutes  Critical care time was exclusive of separately billable procedures and treating other patients. Critical care was necessary to treat or prevent imminent or life-threatening deterioration.  Critical care was time spent personally by me on the following activities: development of treatment plan with patient and/or surrogate as well as nursing, discussions with consultants, evaluation of patient's response to treatment, examination of patient, obtaining history from patient or surrogate, ordering and performing treatments and interventions, ordering and review of laboratory studies, ordering and review of radiographic studies, pulse oximetry and re-evaluation of patient's condition.  Eliseo Gum MSN, AGACNP-BC [place] [phone] If no answer, [phone] 09/11/2019, 8:32 AM

## 2019-09-11 NOTE — Progress Notes (Signed)
Nutrition Follow-up  DOCUMENTATION CODES:   Non-severe (moderate) malnutrition in context of acute illness/injury  INTERVENTION:   Tube Feeding:  Increase Pivot 1.5 to 70 ml/hr Provides 158 g of protein, 2520 kcals, 1277 mL of free water  Continue Juven BID, each packet provides 80 calories, 8 grams of carbohydrate, 2.5  grams of protein (collagen), 7 grams of L-arginine and 7 grams of L-glutamine; supplement contains CaHMB, Vitamins C, E, B12 and Zinc to promote wound healing   NUTRITION DIAGNOSIS:   Moderate Malnutrition related to acute illness as evidenced by mild fat depletion, mild muscle depletion, moderate muscle depletion.  Being addressed via TF   GOAL:   Patient will meet greater than or equal to 90% of their needs  Progressing   MONITOR:   Vent status, TF tolerance, Labs, Weight trends  REASON FOR ASSESSMENT:   Consult  (EMCO tube feeding recommendations)  ASSESSMENT:   Patient with PMH significant for CHF, COPD, HTN, DM, HLD, MDD, and BPH. Presents this admission with CHF exacerbation.  6/24- s/p CABG x4 6/25- VT arrest, emergent sternotomy, VA ECMO cannulation 6/28- De-cannulated, Impella placed 6/29-Bedside Mediastinal Exploration 2/2 tamponade with multiple blood clots removed 7/02 OR for sternal closure with application of wound vac, Cortrak placed but post-pyloric placement unsuccessful 7/03 TF stared at 20 ml/hr but pt with emesis, OG to LWS with 850 mL out 7/04 TPN started at 30 ml/hr 7/05 TPN remains at 30 ml/hr 7/06 TPN at 55 ml/hr, IR placed 10 fr post-pyloric feeding tube. 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 Appears very weak. Pt is working with PT/OT. Pt to work with SLP as able. Remains NPO  Pivot 1.5 at 65 ml/hr, [employer] TF daily, Juven BID via PEG tube. Free  water   Weight 68.8 kg; weight has fluctuted significantly this admission. Net +11.6 L  Continues hydrotherapy for sacral wound  Hypoglycemic with insulin adjusted  Labs:  CBGs 68-140 Meds: questran, lasix, ss novolog  Diet Order:   Diet Order            Diet NPO time specified  Diet effective midnight                 EDUCATION NEEDS:   Education needs have been addressed  Skin:  Skin Assessment: Skin Integrity Issues: Skin Integrity Issues:: Stage II DTI: n/a Stage II: L. Nare under NG tube Unstageable: Sacrum (DTI evolved to [facility] RN following Incisions: R leg, R groin Other: open chest  Last BM:  8/3 rectal tube  Height:   Ht Readings from Last 1 Encounters:  08/27/19 5\' 9"  (1.753 m)    Weight:   Wt Readings from Last 1 Encounters:  09/11/19 68.8 kg    BMI:  Body mass index is 22.4 kg/m.  Estimated Nutritional Needs:   Kcal:  11/11/19 kcals  Protein:  130-160 g  Fluid:  >/= 1.8 L/day   [phone] MS, RDN, LDN, CNSC Registered Dietitian III Clinical Nutrition RD Pager and On-Call Pager Number Located in [place]

## 2019-09-12 LAB — CBC
HCT: 24.6 % — ABNORMAL LOW (ref 39.0–52.0)
Hemoglobin: 7.2 g/dL — ABNORMAL LOW (ref 13.0–17.0)
MCH: 30.9 pg (ref 26.0–34.0)
MCHC: 29.3 g/dL — ABNORMAL LOW (ref 30.0–36.0)
MCV: 105.6 fL — ABNORMAL HIGH (ref 80.0–100.0)
Platelets: 236 10*3/uL (ref 150–400)
RBC: 2.33 MIL/uL — ABNORMAL LOW (ref 4.22–5.81)
RDW: 19.4 % — ABNORMAL HIGH (ref 11.5–15.5)
WBC: 11.8 10*3/uL — ABNORMAL HIGH (ref 4.0–10.5)
nRBC: 0 % (ref 0.0–0.2)

## 2019-09-12 LAB — HEPATIC FUNCTION PANEL
ALT: 117 U/L — ABNORMAL HIGH (ref 0–44)
AST: 95 U/L — ABNORMAL HIGH (ref 15–41)
Albumin: 1.4 g/dL — ABNORMAL LOW (ref 3.5–5.0)
Alkaline Phosphatase: 434 U/L — ABNORMAL HIGH (ref 38–126)
Bilirubin, Direct: 3 mg/dL — ABNORMAL HIGH (ref 0.0–0.2)
Indirect Bilirubin: 2 mg/dL — ABNORMAL HIGH (ref 0.3–0.9)
Total Bilirubin: 5 mg/dL — ABNORMAL HIGH (ref 0.3–1.2)
Total Protein: 5.7 g/dL — ABNORMAL LOW (ref 6.5–8.1)

## 2019-09-12 LAB — GLUCOSE, CAPILLARY
Glucose-Capillary: 138 mg/dL — ABNORMAL HIGH (ref 70–99)
Glucose-Capillary: 150 mg/dL — ABNORMAL HIGH (ref 70–99)
Glucose-Capillary: 156 mg/dL — ABNORMAL HIGH (ref 70–99)
Glucose-Capillary: 161 mg/dL — ABNORMAL HIGH (ref 70–99)
Glucose-Capillary: 173 mg/dL — ABNORMAL HIGH (ref 70–99)
Glucose-Capillary: 178 mg/dL — ABNORMAL HIGH (ref 70–99)
Glucose-Capillary: 183 mg/dL — ABNORMAL HIGH (ref 70–99)

## 2019-09-12 LAB — BASIC METABOLIC PANEL
Anion gap: 8 (ref 5–15)
BUN: 76 mg/dL — ABNORMAL HIGH (ref 8–23)
CO2: 26 mmol/L (ref 22–32)
Calcium: 7.7 mg/dL — ABNORMAL LOW (ref 8.9–10.3)
Chloride: 105 mmol/L (ref 98–111)
Creatinine, Ser: 0.92 mg/dL (ref 0.61–1.24)
GFR calc Af Amer: >60 mL/min (ref >60)
GFR calc non Af Amer: >60 mL/min (ref >60)
Glucose, Bld: 176 mg/dL — ABNORMAL HIGH (ref 70–99)
Potassium: 3.6 mmol/L (ref 3.5–5.1)
Sodium: 139 mmol/L (ref 135–145)

## 2019-09-12 LAB — MAGNESIUM: Magnesium: 2.1 mg/dL (ref 1.7–2.4)

## 2019-09-12 LAB — PHOSPHORUS: Phosphorus: 4.5 mg/dL (ref 2.5–4.6)

## 2019-09-12 MED ORDER — QUETIAPINE FUMARATE 25 MG PO TABS
25.0000 mg | ORAL_TABLET | Freq: Two times a day (BID) | ORAL | Status: DC
  Administered 2019-09-12 (×2): 25 mg
  Filled 2019-09-12 (×2): qty 1

## 2019-09-12 MED ORDER — POTASSIUM CHLORIDE 20 MEQ/15ML (10%) PO SOLN
40.0000 meq | Freq: Two times a day (BID) | ORAL | Status: AC
  Administered 2019-09-12 (×2): 40 meq via ORAL
  Filled 2019-09-12 (×2): qty 30

## 2019-09-12 NOTE — Progress Notes (Signed)
Chaplain offered prayer over Todd [name] at the request of his wife and her caregiver.  Todd [name] appeared to be appreciative of chaplain's visit.  Chaplain will continue to follow-up.

## 2019-09-12 NOTE — Progress Notes (Signed)
Pharmacy Antibiotic Note  Todd [name] is a 64 y.o. male admitted on 07/26/2019 with CHF exacerbation. 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 VT arrest on 6/25 and underwent VA ECMO. Decannulated on 6/28 and Impella placed. Chest closed on 7/2 and Imella removed 7/9. Cholecystostomy tube placed on 7/16.  Was on antibiotics initially for possible HCAP.   Pharmacy has been consulted for fluconazole for candidemia and vancomycin for wound and effusion.   Patient remains afebrile, wbc 11.8, renal function stable, stop dates in place for antibiotics.  Plan: -Continue vancomycin to 771m q24h through 8/5 -Continue fluconazole 4017mq24h through 8/10 -Stop dates entered by ID team  Height: _0  (175.3 cm) Weight: 73.4 kg (161 lb 13.1 oz) (new bed) IBW/kg (Calculated) : 70.7  Temp (24hrs), Avg:98.3 F (36.8 C), Min:97.9 F (36.6 C), Max:98.5 F (36.9 C)  Recent Labs  Lab 09/08/19 0114 09/09/19 0140 09/10/19 0145 09/11/19 0133 09/12/19 0304  WBC 13.4* 15.0* 17.7* 13.2* 11.8*  CREATININE 0.92 0.94 0.85 0.91 0.92    Estimated Creatinine Clearance: 80 mL/min (by C-G formula based on SCr of 0.92 mg/dL).    Allergies  Allergen Reactions  . Empagliflozin     Other reaction(s): diarrhea  . 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 Other     Other reaction(s): Unknown  . Simvastatin     Other reaction(s): diarrhea  . Sitagliptin     Other reaction(s): diarrhea/abd pain    Antimicrobials this admission: Zosyn 7/14>> 7/21 per ID Fluconazole 7/17>> (8/10) Cefuroxime 6/23>>6/25 Vancomycin 6/24>6/24;  6/26>>7/2, 7/5>>7/13 7/22>>(8/5) Meropenem 6/26>>7/4, 7/5>>7/14, 7/22>>7/26 [name] 7/7>>7/14  Dose adjustments thisdosing regimen: 6/30 VR 33, 7/1 22 (ke 0.026, T1/2 26.6) - change to vancomycin 1750 mg q36 7/7 VT = 19 (drawn prior to 3rd dose) - reduce to 1g IV every 24 hours 7/11 VT 21 - reduce to 750 q 24, retime for later  tonight>start 7/12 AM 7/26 VT 22 (drawn 3h late) - reduce to 750q24h  Microbiology results: 7/3+7/5 TA - few yeast> few candida topicalis  7/5 + 7/7 Bcx: neg 7/7 Ucx: neg 7/9 Wound Cx: neg 7/12 BAL: candida albicans, candida tropicalis 7/14 pleural fluid: rare candida parapsilosis 7/14 Blood>>>candida parapsilosis  7/16 abscess: ngF 7/17 BCx: ng 7/21 Blood: ng 7/22 Bcx: ng 7/22 TA: no orgs seen >> reincubating  ChRichardine ServicePharmD PGY2 Cardiology Pharmacy Resident Phone: [phone]/05/2019  12:08 PM  Please check [email or web address] for unit-specific pharmacy phone numbers.

## 2019-09-12 NOTE — Progress Notes (Signed)
26 Days Post-Op Procedure(s) (LRB): REMOVAL OF IMPELLA LEFT VENTRICULAR ASSIST DEVICE (N/A) TRANSESOPHAGEAL ECHOCARDIOGRAM (TEE) (N/A) Subjective: Currently getting vibrating chest PT Denies pain, slept well last night  Objective: Vital signs in last 24 hours: Temp:  [97.9 F (36.6 C)-98.5 F (36.9 C)] 97.9 F (36.6 C) (08/04 0327) Pulse Rate:  [70-87] 74 (08/04 0700) Cardiac Rhythm: Normal sinus rhythm (08/03 2000) Resp:  [13-29] 26 (08/04 0700) BP: (95-137)/(46-116) 127/56 (08/04 0700) SpO2:  [91 %-100 %] 96 % (08/04 0728) FiO2 (%):  [40 %] 40 % (08/04 0728) Weight:  [73.4 kg] 73.4 kg (08/04 0500)  Hemodynamic parameters for last 24 hours:    Intake/Output from previous day: 08/03 0701 - 08/04 0700 In: 4590.1 [I.V.:206.7; QA/ST:4196.2; IV Piggyback:350] Out: 2128 [Urine:1526; Drains:600; Stool:2] Intake/Output this shift: No intake/output data recorded.  General appearance: alert, cooperative and no distress Neurologic: intact Heart: regular rate and rhythm Lungs: clear to auscultation bilaterally  Lab Results: Recent Labs    09/11/19 0133 09/12/19 0304  WBC 13.2* 11.8*  HGB 7.1* 7.2*  HCT 24.8* 24.6*  PLT 247 236   BMET:  Recent Labs    09/11/19 0133 09/12/19 0304  NA 140 139  K 4.1 3.6  CL 105 105  CO2 25 26  GLUCOSE 91 176*  BUN 76* 76*  CREATININE 0.91 0.92  CALCIUM 7.9* 7.7*    PT/INR: No results for input(s): LABPROT, INR in the last 72 hours. ABG    Component Value Date/Time   PHART 7.416 08/29/2019 1440   HCO3 25.7 08/29/2019 1440   TCO2 27 08/29/2019 1440   ACIDBASEDEF 1.0 08/08/2019 1115   O2SAT 67.1 09/02/2019 0234   CBG (last 3)  Recent Labs    09/11/19 1940 09/12/19 0020 09/12/19 0257  GLUCAP 195* 178* 173*    Assessment/Plan: S/P Procedure(s) (LRB): REMOVAL OF IMPELLA LEFT VENTRICULAR ASSIST DEVICE (N/A) TRANSESOPHAGEAL ECHOCARDIOGRAM (TEE) (N/A) -NEURO- alert and interactive this AM CV- in SR on amiodarone  BP ok on  midodrine  On Eliquis RESP_ VDRF, did 5 hours on PS yesterday, rested overnight RENAL- creatinine normal, BUN remains elevated  Supplement K GI- Bili slowly trending down, perc chole drained about 600 yesterday Nutrition- severe protein calorie malnutrition- on TF ID- WBC trending down, afebrile Deconditioning- PT/OT      LOS: 48 days    [name] 09/12/2019

## 2019-09-12 NOTE — Progress Notes (Signed)
Occupational Therapy Treatment Patient Details Name: Todd [name] MRN: [id number] DOB: 07-16-1955 Today's Date: 09/12/2019    History of present illness Todd [name] is a 64 y.o. male presenting with shortness of breath; noted AKI, CHF, recent PNA, elevated troponin, pulm edema/effusion, and cardiomegaly.Marland Kitchen CABG 6/24 and intubated (extubated 6/25)--went into cardiac arrest on same date with re-opening of chest for heart massage and direct epicardial paddles and place on ECMO. 08/03/19 washout out for tamponade. 7/2 sternal closure and wound vac. 7/9 Impella removed.08/20/19 Tracheostomy 08/24/19 cholecystostomy tube. 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 PMHx: HFrEF, COPD, HTN, T2DM, HLD, MDD, chronic back pain, vitamin deficiency, BPH, tobacco use disorder, marijuana use disorder   OT comments  Pt making slow progress towards OT goals. Pt now with Kreg Tilt bed however with decreased arousal this PM and not safe to attempt tilt feature. Use of bed egress to chair position, pt more alert once upright but less engaging than previous OT session and notably more fatigued this PM. Pt with increased weakness in bil UEs and unable to perform functional task without significant assist. Pt requiring two person totalA for attempts to pull UB away from [facility] and for repositioning. Pt's spouse present and supportive throughout. Will continue per POC at this time.  BP start of session 124/49 (71) While seated in chair position: 116/45 (66), 124/49 (69) End of session 113/60 (72) HR 80s SpO2 >92% on trach/vent (PEEP 5, 40% FiO2)   Follow Up Recommendations  LTACH;Supervision/Assistance - 24 hour    Equipment Recommendations  Other (comment) (TBA)    Recommendations for Other Services      Precautions / Restrictions Precautions Precautions: Fall;Sternal;Other (comment) Precaution Booklet Issued: No Precaution Comments: vent, condom cath, chole drain, sacral wound, trach,  flexiseal Other Brace: B Prevalon Restrictions Other Position/Activity Restrictions: sternal precautions        Mobility Bed Mobility Overal bed mobility: Needs Assistance             General bed mobility comments: use of bed to chair egress - pt requiring totalA+2 for pulling trunk forward away from [facility] and for repositioning. completed x3  Transfers                 General transfer comment: deferred standing trials given pt arousal levels    Balance Overall balance assessment: Needs assistance Sitting-balance support: No upper extremity supported;Feet supported Sitting balance-Leahy Scale: Zero                                     ADL either performed or assessed with clinical judgement   ADL Overall ADL's : Needs assistance/impaired                                       General ADL Comments: increased difficulty using UEs to self assist with basic tasks today     Vision       Perception     Praxis      Cognition Arousal/Alertness: Lethargic Behavior During Therapy: Flat affect;Anxious Overall Cognitive Status: Difficult to assess Area of Impairment: Following commands;Attention;Memory;Safety/judgement;Orientation                 Orientation Level: Disoriented to;Time;Place;Situation Current Attention Level: Sustained   Following Commands: Follows one step commands inconsistently;Follows one step commands  with increased time Safety/Judgement: Decreased awareness of safety;Decreased awareness of deficits Awareness: Intellectual Problem Solving: Slow processing;Decreased initiation;Difficulty sequencing;Requires verbal cues;Requires tactile cues General Comments: pt initially very lethargic today and only able to maintain eyes open for brief period of time. pt nodding his head yes when spouse asking if pt is nervous. pt more alert as session progressed but continues to have difficulty with command follow tday          Exercises General Exercises - Lower Extremity Ankle Circles/Pumps: AAROM;PROM;Both;5 reps Long Arc Quad: PROM;AAROM;Both;5 reps Other Exercises Other Exercises: reaching task with alternating UEs, requriring AA/PROM, x5   Shoulder Instructions       General Comments      Pertinent Vitals/ Pain       Pain Assessment: Faces Faces Pain Scale: Hurts little more Pain Location: sacrum, L shoulder with certain mobility Pain Descriptors / Indicators: Grimacing;Discomfort Pain Intervention(s): Monitored during session;Repositioned  Home Living                                          Prior Functioning/Environment              Frequency  Min 2X/week (will benefit from 3)        Progress Toward Goals  OT Goals(current goals can now be found in the care plan section)  Progress towards OT goals: OT to reassess next treatment  Acute Rehab OT Goals Patient Stated Goal: pt unable to state OT Goal Formulation: Patient unable to participate in goal setting Time For Goal Achievement: 09/16/19 Potential to Achieve Goals: Fair  Plan Discharge plan remains appropriate    Co-evaluation                 AM-PAC OT "6 Clicks" Daily Activity     Outcome Measure   Help from another person eating meals?: Total Help from another person taking care of personal grooming?: Total Help from another person toileting, which includes using toliet, bedpan, or urinal?: Total Help from another person bathing (including washing, rinsing, drying)?: Total Help from another person to put on and taking off regular upper body clothing?: Total Help from another person to put on and taking off regular lower body clothing?: Total 6 Click Score: 6    End of Session Equipment Utilized During Treatment: Oxygen  OT Visit Diagnosis: Other abnormalities of gait and mobility (R26.89);Muscle weakness (generalized) (M62.81);Other symptoms and signs involving cognitive  function;Pain Pain - Right/Left: Left Pain - part of body: Shoulder (and general discomfort)   Activity Tolerance Patient tolerated treatment well;Patient limited by fatigue   Patient Left in bed;with call bell/phone within reach;with family/visitor present   Nurse Communication Mobility status        Time: 4008-6761 OT Time Calculation (min): 33 min  Charges: OT General Charges $OT Visit: 1 Visit OT Treatments $Self Care/Home Management : 8-22 mins $Therapeutic Activity: 8-22 mins  Marcy Siren, OT Acute Rehabilitation Services Pager [phone] Office [phone]   [name] 09/12/2019, 5:01 PM

## 2019-09-12 NOTE — Progress Notes (Signed)
NAME:  Todd [name], MRN:  [id number], DOB:  12-08-1955, LOS: 30 ADMISSION DATE:  07/26/2019, CONSULTATION DATE:  08/03/19 REFERRING MD:  Roxan Hockey, CHIEF COMPLAINT:  ECMO   Brief History   64 yo male admitted with CHF exacerbation, found to have multi-vessel disease requiring CABG 6/24, post op VT Arrest 6/25 and cannulated for VA ECMO.    History of present illness   Presented with worsening dyspnea 6/17 c/w CHF exacerbation.  LHC  with severe triple vessel disease.  Underwent CABG 6/24. Vtach arrest 6/25.  Chest opened bedside and cardiac massage initiated as well as multiple cardioversions, amiodarone, bicarb etc.  Brought to OR and cannulated for VA ECMO.  PCCM consulted to assist with management  Comorbidities include DM, heavy smoking, COPD  Past Medical History  Depression Ischemic cardiomyopathy HTN HLD  Significant Hospital Events   6/24 CABG 6/25 VA cannulation 6/28 decannulated and impella placed 6/29 bedside re-exploration; s/p decannulation. Placement of impella device originally at p8 but decreased to p4 2/2 suction events overnight up to p6. Echo completed and repositioned device. Remained on considerable support with 8 epi and 46 norepi vaso 0.05. continued chest tube output with large clots noted. Heparin thru device. Replacement products ongoing. 60% 8 peep 6/30 bedside mediastinal re-exploration and clean out of hematoma with tamponade and worsening hemodynamics. Pt had large volume transfusion. rebolused with amio 2/2 nsvt episode.  Weight up 48 pounds.  Started diuresis, Lasix drip started. 7/01 iatrogenic respiratory alkalosis, vent rate decreased. 7/02 No significant issues overnight remains on pressors and Impella not tolerating tube feeds with high gastric output awaiting core track placement 7/03 started trickle TF  7/05 Swab removed, CVL and PICC placed. 7/13 Trach and BAL 7/16 Awake and tracking. No follow commands. Still having fevers. Successful  ultrasound and CT guided placement of a 10.2 French cholecystostomy tube. A small amount of aspirated bile was capped and sent to the laboratory for analysis 7/17 Off sedation, PSV.  Beginning to follow some commands occasionally intermittently.  Very jaundiced.  Growing Candida in the blood and BAL > diflucan.   7/21 Ongoing fevers, WBC increased 7/22 Persistent fevers, pan cultured with CT Chest/Head. Suspected source tracheobronchitis.  7/28 Transitioned to eliquis, jaundiced but labs improving  7/29 Agitation/restless.  Trazodone added QHS. Weaning midodrine, attempting diuresis  7/30 Increased Na, weaning on PSV.  8/01 On 40% ATC, significantly improved mental status  Consults:  Advanced heart failure, PCCM, TCTS  Procedures:  LUE PICC 7/5 >>7/21 R Brachial ALine 7/9 >>7/27 Chest Tubes >> L IJ CVC 7/21 >>7/27 Peg tube 7/27 >>  Significant Diagnostic Tests:  6/22 spirometry with restrictive physiology, preserved FEV1/FVC 6/18 echo: LVEF 34-19%, grade I diastolic dysfunction 6/22 US Abdomen cholelithiasis without cholecystitis. Mild wall thinking in setting of liver disease and ascites.  7/10 LE [place] negative for DVT  7/20 CT ABD w contrast >> stable position of the percutaneous cholecystostomy tube with complete decompression of the gallbladder, trace residual free fluid within the RUQ, decreased since prior, trace bilateral pleural effusions L>R, scattered areas of ground glass airspace disease within the RML, RLL 7/22 CT Chest w/o Contrast >> Retrosternal fluid collection, non specific.  Bilateral pleural effusions, portions loculated.   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 Stable bilateral mastoid and middle ear effusions.   Micro Data:  COVID and HIV 6/17 >> Neg 7/3 respiratory >> few Candida tropicalis 7/5 blood >> negative  7/5  respiratory >> few Candida tropicalis 7/7 urine >> negative 7/7  blood >> negative  7/9 wound >> negative 7/12 BAL >> candida tropicalis, candida albicans 7/14 BCID >> candida parapsilosis   7/14 BCx2 >> candida parapsilosis  7/17 BCx2 >> negative  7/21 BCx2 >> negative  7/22 Respiratory Cx >> candida 7/24 c diff >> neg  Antimicrobials:  Vanc 6/24>>7/13 Cefuroxime 6/24 >> 6/25 merrem 6/26 >> 7/14 Anidulafungin 7/8 >> 7/14 Zosyn 7/14 >>7/21 Diflucan 7/17 >> (stop date 8/10) Vanco 7/22 >> (stop date 8/6) Mero 7/22   Interim history/subjective:  Tolerated 6 hrs PSV CPAP yesterday. Rested on PRCV overnight. Slept well with addition of seroquel   Hgb stable from 7.1 yesterday to 7.2 today   Objective   Blood pressure (!) 127/56, pulse 74, temperature 98.3 F (36.8 C), temperature source Oral, resp. rate (!) 26, height _0  (1.753 m), weight 73.4 kg, SpO2 96 %.    Vent Mode: PRVC FiO2 (%):  [40 %] 40 % Set Rate:  [14 bmp] 14 bmp Vt Set:  [420 mL] 420 mL PEEP:  [5 cmH20] 5 cmH20 Pressure Support:  [5 cmH20] 5 cmH20 Plateau Pressure:  [16 cmH20] 16 cmH20   Intake/Output Summary (Last 24 hours) at 09/12/2019 0748 Last data filed at 09/12/2019 0700 Gross per 24 hour  Intake 4590.12 ml  Output 2128 ml  Net 2462.12 ml   Filed Weights   09/10/19 0400 09/11/19 0335 09/12/19 0500  Weight: 68.7 kg 68.8 kg 73.4 kg   Exam GEN: Chronically ill appearing M, appears older than stated age, reclined in bed trach/vent NAD  HEENT: Trach c/d/secure without secretions. Temporal muscle wasting   CV: RRR s1s2 no rgm    PULM: Crackles. Symmetrical chest expansion, no accessory muscle use on PSV/CPAP  GI: Flat, soft, ndnt  EXT: Symmetrical muscle wasting. No cyanosis. BLE boots  NEURO: Awake alert following commands. PERRL  PSYCH: Slightly anxious mood/affect SKIN: Jaundice, scattered ecchymosis, c/d/warm  Lab Results  Component Value Date   WBC 11.8 (H) 09/12/2019   HGB 7.2 (L) 09/12/2019   HCT 24.6 (L) 09/12/2019   MCV 105.6 (H) 09/12/2019   PLT  236 09/12/2019   Lab Results  Component Value Date   CREATININE 0.92 09/12/2019   BUN 76 (H) 09/12/2019   NA 139 09/12/2019   K 3.6 09/12/2019   CL 105 09/12/2019   CO2 26 09/12/2019   Resolved Hospital Problem list   Suspected HCAP Acute kidney injury, improved  Hypernatremia, improved  Acute Metabolic Encephalopathy  -CTH without acute intracranial pathology. EEG negative 7/23.  Shock Heparin-induced thrombocytopenia >> resolved, required bivalirudin  Assessment & Plan:   Anxiety -likely ICU related stress disorder -low dose BID seroquel added 8/3. Room to increase if needed, keeping as is 8/4   Acute Metabolic Encephalopathy, improved  -CTH without acute intracranial pathology. EEG negative 7/23.  -delirium precautions -B-1    Acute hypoxic respiratory failure w/ need for mechanical ventilation - status post tracheostomy Poor lung mechanics due to weakness, deconditioning   -mucus plug + desat 8/2 requiring BVM  + full vent support P -Move from PSV/CPAP during day toward ATC trials during day and PRVC at night  -continue xopenex -CPT, pulm hygiene -continue BID lasix   Cardiogenic/hemorrhagic shock status post CABG complicated by VT arrest, improved S/p VA ECMO (decanulated 6/28). S/p impella placement (6/28> 7/9) -continue midodrine -Continue diuresis (44m Lasix BID per tube) -follow I/O's  -post operative care per TCTS   Atrial flutter -  continue amiodarone, ASA, Eliquis  -tele monitoring    Hyperbilirubinemia due to acalculous cholecystitis Possible cirrhosis per 7/8 [place]  Status post  percutaneous GB drain  Jaundice downtrending tbili  -drain care per IR  -Trend tbili, consider removal of drain when normalized  -questran   DM II with fluctuant hypo/hyperglycemia -monitor glucose  -continue TF  -BID levemir + rSSI   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, resolved  P -Continue eliquis  -follow CBC closely -transfuse for Hgb <7%, active bleeding   Acute kidney injury, improved  Hypernatremia, improved  -Trend BMP / urinary output -Replace electrolytes as indicated -Avoid nephrotoxic agents, ensure adequate renal perfusion -continue free water flushes  Sacral Decubitus  -appreciate WOC assistance    Deconditioning of Critical Illness  -PT / OT efforts  Small left adrenal mass seen on CT scan of the abdomen August 24, 2019 - OP f/u  Daily Goals Checklist  Pain/Anxiety/Delirium protocol (if indicated): BID seroquel  VAP protocol (if indicated): bundle in place.  DVT prophylaxis: eliquis Nutrition Status: TF  GI prophylaxis: PPI Urinary catheter: n/a Central lines: removed Glucose control: See above Mobility/therapy needs: PT for ROM  Code Status: Full  Family Communication: per primary Disposition: ICU -- likely will need LTACH  CRITICAL CARE Performed by: Cristal Generous   Total critical care time: 35 minutes  Critical care time was exclusive of separately billable procedures and treating other patients. Critical care was necessary to treat or prevent imminent or life-threatening deterioration.  Critical care was time spent personally by me on the following activities: development of treatment plan with patient and/or surrogate as well as nursing, discussions with consultants, evaluation of patient's response to treatment, examination of patient, obtaining history from patient or surrogate, ordering and performing treatments and interventions, ordering and review of laboratory studies, ordering and review of radiographic studies, pulse oximetry and re-evaluation of patient's condition.  Eliseo Gum MSN, AGACNP-BC [place] [phone] If no answer, [phone] 09/12/2019, 7:48 AM

## 2019-09-12 NOTE — Plan of Care (Signed)
Stable during shift. Slept well after Seroquel. Remains on vent, sats stable, minimal secretions.   BP/HR stable. Good urine output (not all output captured in I&O d/t primofit and condom cath failures.   Sacral wound, dressing reinforced x 2 d/t weeping. Lateral turns only, propping with pillows, Tilt bed.   Afebrile. Tolerating tube feeds.   Problem: Education: Goal: Knowledge of General Education information will improve Description: Including pain rating scale, medication(s)/side effects and non-pharmacologic comfort measures Outcome: Progressing   Problem: Health Behavior/Discharge Planning: Goal: Ability to manage health-related needs will improve Outcome: Progressing   Problem: Clinical Measurements: Goal: Ability to maintain clinical measurements within normal limits will improve Outcome: Progressing Goal: Will remain free from infection Outcome: Progressing Goal: Respiratory complications will improve Outcome: Progressing Goal: Cardiovascular complication will be avoided Outcome: Progressing   Problem: Activity: Goal: Risk for activity intolerance will decrease Outcome: Progressing   Problem: Nutrition: Goal: Adequate nutrition will be maintained Outcome: Progressing   Problem: Coping: Goal: Level of anxiety will decrease Outcome: Progressing   Problem: Elimination: Goal: Will not experience complications related to urinary retention Outcome: Progressing   Problem: Pain Managment: Goal: General experience of comfort will improve Outcome: Progressing   Problem: Safety: Goal: Ability to remain free from injury will improve Outcome: Progressing   Problem: Skin Integrity: Goal: Risk for impaired skin integrity will decrease Outcome: Progressing

## 2019-09-12 NOTE — Progress Notes (Signed)
Patient ID: Todd [name], male   DOB: 10/20/1955, 64 y.o.   MRN: [id number] TCTS Evening Rounds:  Hemodynamically stable in sinus rhythm. Afebrile.  Spent a little time on PS today.  Urine output good.

## 2019-09-12 NOTE — Progress Notes (Signed)
Physical Therapy Wound Treatment Patient Details  Name: Todd [name] MRN: [id number] Date of Birth: 07/21/1955  Today's Date: 09/12/2019 Time: 5183-3582 Time Calculation (min): 41 min  Subjective  Subjective: unable (vent/trach), smiling intermittently Patient and Family Stated Goals: unable Date of Onset:  (unknown) Prior Treatments: foam dressing  Pain Score:  6/10 (premedicated)  Wound Assessment  Pressure Injury 08/09/19 Sacrum Right Unstageable - Full thickness tissue loss in which the base of the injury is covered by slough (yellow, tan, gray, green or brown) and/or eschar (tan, brown or black) in the wound bed. has evolved into unstageable whe (Active)  Dressing Type ABD;Barrier Film (skin prep);Gauze (Comment);Moist to moist 09/12/19 1208  Dressing Changed;Clean;Dry;Intact 09/12/19 1208  Dressing Change Frequency Daily 09/12/19 1208  State of Healing Non-healing 09/12/19 1208  Site / Wound Assessment Bleeding;Yellow;Red;Pale;Granulation tissue 09/12/19 1208  % Wound base Red or Granulating 10% 09/12/19 1208  % Wound base Yellow/Fibrinous Exudate 90% 09/12/19 1208  % Wound base Black/Eschar 0% 09/12/19 1208  % Wound base Other/Granulation Tissue (Comment) 0% 09/12/19 1208  Peri-wound Assessment Erythema (blanchable) 09/12/19 1208  Wound Length (cm) 8 cm 09/12/19 1208  Wound Width (cm) 5 cm 09/12/19 1208  Wound Depth (cm) 2.5 cm 09/12/19 1208  Wound Surface Area (cm^2) 40 cm^2 09/12/19 1208  Wound Volume (cm^3) 100 cm^3 09/12/19 1208  Margins Unattached edges (unapproximated) 09/12/19 1208  Drainage Amount Moderate 09/12/19 1208  Drainage Description Serosanguineous 09/12/19 1208  Treatment Debridement (Selective);Hydrotherapy (Pulse lavage);Packing (Saline gauze) 09/12/19 1208     Hydrotherapy Pulsed lavage therapy - wound location: sacrum Pulsed Lavage with Suction (psi): 12 psi Pulsed Lavage with Suction - Normal Saline Used: 1000 mL Pulsed Lavage Tip: Tip with splash  shield Selective Debridement Selective Debridement - Location: sacrum Selective Debridement - Tools Used: Forceps;Scalpel Selective Debridement - Tissue Removed: yellow/gray slough   Wound Assessment and Plan  Wound Therapy - Assess/Plan/Recommendations Wound Therapy - Clinical Statement: Continued removal of yellow necrotic tissue, appropaching bone. Pt continues to bleed easily with sharp debridement.  Wound Therapy - Functional Problem List: limited positioning options due to pressure wound Factors Delaying/Impairing Wound Healing: Diabetes Mellitus;Incontinence;Immobility;Multiple medical problems Hydrotherapy Plan: Debridement;Dressing change;Patient/family education;Pulsatile lavage with suction Wound Therapy - Frequency: 6X / week Wound Therapy - Follow Up Recommendations: Other (comment) (LTACH (has been denied, and remains appropriate)) Wound Plan: see above  Wound Therapy Goals- Improve the function of patient's integumentary system by progressing the wound(s) through the phases of wound healing (inflammation - proliferation - remodeling) by: Decrease Necrotic Tissue to: 50% Decrease Necrotic Tissue - Progress: Progressing toward goal Increase Granulation Tissue to: 50 Increase Granulation Tissue - Progress: Progressing toward goal Time For Goal Achievement: 7 days Wound Therapy - Potential for Goals: Fair  Goals will be updated until maximal potential achieved or discharge criteria met.  Discharge criteria: when goals achieved, discharge from hospital, MD decision/surgical intervention, no progress towards goals, refusal/missing three consecutive treatments without notification or medical reason.  GP    Wyona Almas, PT, DPT Acute Rehabilitation Services Pager [phone] Office [phone]      Deno Etienne 09/12/2019, 12:14 PM

## 2019-09-12 NOTE — Progress Notes (Deleted)
09/12/2019  Seen and examined in f/u for respiratory failure. Chest tubes are out. Patient looks good on PS. Still has muscle wasting and crackles on exam. Still anxious with occasional delirium  O: On exam, cachetic man in NAD Mildly anxious Follows commands GB drain in place  CXR stable bilateral airspace disease +2.5L Bili stable CBG good  A: Postoperative (CABG) prolonged respiratory failure related to persistent cardiogenic shock, deconditioning, HCAP, fungemia all of which is improving. Hx DM, COPD Acute cholestatic liver injury question acalculous cholecystitis post GB drain Afib Anemia stable HIT Sacral decubitus ulcer  P: - Continue TC trials, vent at night - Diuresis as tolerated - Appreciate PT/OT working with patient - Abx per primary - Can probably work toward [employer] vs. Rehab soon  32 min cc time Myrla Halsted MD PCCM

## 2019-09-13 ENCOUNTER — Inpatient Hospital Stay (HOSPITAL_COMMUNITY): Payer: Medicare Other

## 2019-09-13 LAB — CBC
HCT: 24 % — ABNORMAL LOW (ref 39.0–52.0)
Hemoglobin: 6.9 g/dL — CL (ref 13.0–17.0)
MCH: 30.3 pg (ref 26.0–34.0)
MCHC: 28.8 g/dL — ABNORMAL LOW (ref 30.0–36.0)
MCV: 105.3 fL — ABNORMAL HIGH (ref 80.0–100.0)
Platelets: 224 10*3/uL (ref 150–400)
RBC: 2.28 MIL/uL — ABNORMAL LOW (ref 4.22–5.81)
RDW: 19.1 % — ABNORMAL HIGH (ref 11.5–15.5)
WBC: 12.9 10*3/uL — ABNORMAL HIGH (ref 4.0–10.5)
nRBC: 0 % (ref 0.0–0.2)

## 2019-09-13 LAB — BASIC METABOLIC PANEL
Anion gap: 9 (ref 5–15)
BUN: 82 mg/dL — ABNORMAL HIGH (ref 8–23)
CO2: 26 mmol/L (ref 22–32)
Calcium: 8.1 mg/dL — ABNORMAL LOW (ref 8.9–10.3)
Chloride: 108 mmol/L (ref 98–111)
Creatinine, Ser: 0.95 mg/dL (ref 0.61–1.24)
GFR calc Af Amer: >60 mL/min (ref >60)
GFR calc non Af Amer: >60 mL/min (ref >60)
Glucose, Bld: 151 mg/dL — ABNORMAL HIGH (ref 70–99)
Potassium: 4.3 mmol/L (ref 3.5–5.1)
Sodium: 143 mmol/L (ref 135–145)

## 2019-09-13 LAB — GLUCOSE, CAPILLARY
Glucose-Capillary: 140 mg/dL — ABNORMAL HIGH (ref 70–99)
Glucose-Capillary: 127 mg/dL — ABNORMAL HIGH (ref 70–99)
Glucose-Capillary: 149 mg/dL — ABNORMAL HIGH (ref 70–99)
Glucose-Capillary: 153 mg/dL — ABNORMAL HIGH (ref 70–99)
Glucose-Capillary: 140 mg/dL — ABNORMAL HIGH (ref 70–99)

## 2019-09-13 LAB — PHOSPHORUS: Phosphorus: 4.1 mg/dL (ref 2.5–4.6)

## 2019-09-13 LAB — MAGNESIUM: Magnesium: 2.5 mg/dL — ABNORMAL HIGH (ref 1.7–2.4)

## 2019-09-13 LAB — PREPARE RBC (CROSSMATCH)

## 2019-09-13 MED ORDER — FREE WATER
100.0000 mL | Status: DC
  Administered 2019-09-13 – 2019-09-14 (×6): 100 mL

## 2019-09-13 MED ORDER — SODIUM CHLORIDE 0.9% IV SOLUTION: Freq: Once | INTRAVENOUS | Status: DC

## 2019-09-13 MED ORDER — QUETIAPINE FUMARATE 25 MG PO TABS
25.0000 mg | ORAL_TABLET | Freq: Every day | ORAL | Status: DC
  Administered 2019-09-13: 25 mg via ORAL
  Filled 2019-09-13: qty 1

## 2019-09-13 MED ORDER — FLUOXETINE HCL 20 MG/5ML PO SOLN
20.0000 mg | Freq: Every day | ORAL | Status: DC
  Administered 2019-09-13 – 2019-10-09 (×27): 20 mg
  Filled 2019-09-13 (×27): qty 5

## 2019-09-13 MED ORDER — LIDOCAINE 5 % EX PTCH
1.0000 | MEDICATED_PATCH | CUTANEOUS | Status: DC
  Administered 2019-09-13 – 2019-10-09 (×27): 1 via TRANSDERMAL
  Filled 2019-09-13 (×27): qty 1

## 2019-09-13 NOTE — Progress Notes (Signed)
Pt requested to be taken off vent and put on trach collar. RT spoke with CCM and they verified that, that was okay to do at this time. Pt sats dropped to 83%, RT was able to suction out a copious amount of pink tinged/tan secretions and two small mucous plugs. Once suctioned pt saturations came back up to 98% on 10L and 60% ATC. Pt states he feels much better now. RT will continue to monitor.

## 2019-09-13 NOTE — Progress Notes (Signed)
NAME:  Todd [name], MRN:  [id number], DOB:  1955-04-25, LOS: 38 ADMISSION DATE:  07/26/2019, CONSULTATION DATE:  08/03/19 REFERRING MD:  Roxan Hockey, CHIEF COMPLAINT:  ECMO   Brief History   64 yo male admitted with CHF exacerbation, found to have multi-vessel disease requiring CABG 6/24, post op VT Arrest 6/25 and cannulated for VA ECMO.    History of present illness   Presented with worsening dyspnea 6/17 c/w CHF exacerbation.  LHC  with severe triple vessel disease.  Underwent CABG 6/24. Vtach arrest 6/25.  Chest opened bedside and cardiac massage initiated as well as multiple cardioversions, amiodarone, bicarb etc.  Brought to OR and cannulated for VA ECMO.  PCCM consulted to assist with management  Comorbidities include DM, heavy smoking, COPD  Past Medical History  Depression Ischemic cardiomyopathy HTN HLD  Significant Hospital Events   6/24 CABG 6/25 VA cannulation 6/28 decannulated and impella placed 6/29 bedside re-exploration; s/p decannulation. Placement of impella device originally at p8 but decreased to p4 2/2 suction events overnight up to p6. Echo completed and repositioned device. Remained on considerable support with 8 epi and 46 norepi vaso 0.05. continued chest tube output with large clots noted. Heparin thru device. Replacement products ongoing. 60% 8 peep 6/30 bedside mediastinal re-exploration and clean out of hematoma with tamponade and worsening hemodynamics. Pt had large volume transfusion. rebolused with amio 2/2 nsvt episode.  Weight up 48 pounds.  Started diuresis, Lasix drip started. 7/01 iatrogenic respiratory alkalosis, vent rate decreased. 7/02 No significant issues overnight remains on pressors and Impella not tolerating tube feeds with high gastric output awaiting core track placement 7/03 started trickle TF  7/05 Swab removed, CVL and PICC placed. 7/13 Trach and BAL 7/16 Awake and tracking. No follow commands. Still having fevers. Successful  ultrasound and CT guided placement of a 10.2 French cholecystostomy tube. A small amount of aspirated bile was capped and sent to the laboratory for analysis 7/17 Off sedation, PSV.  Beginning to follow some commands occasionally intermittently.  Very jaundiced.  Growing Candida in the blood and BAL > diflucan.   7/21 Ongoing fevers, WBC increased 7/22 Persistent fevers, pan cultured with CT Chest/Head. Suspected source tracheobronchitis.  7/28 Transitioned to eliquis, jaundiced but labs improving  7/29 Agitation/restless.  Trazodone added QHS. Weaning midodrine, attempting diuresis  7/30 Increased Na, weaning on PSV.  8/01 On 40% ATC, significantly improved mental status  Consults:  Advanced heart failure, PCCM, TCTS  Procedures:  LUE PICC 7/5 >>7/21 R Brachial ALine 7/9 >>7/27 Chest Tubes >> L IJ CVC 7/21 >>7/27 Peg tube 7/27 >>  Significant Diagnostic Tests:  6/22 spirometry with restrictive physiology, preserved FEV1/FVC 6/18 echo: LVEF 03-50%, grade I diastolic dysfunction 0/93 US Abdomen cholelithiasis without cholecystitis. Mild wall thinking in setting of liver disease and ascites.  7/10 LE [place] negative for DVT  7/20 CT ABD w contrast >> stable position of the percutaneous cholecystostomy tube with complete decompression of the gallbladder, trace residual free fluid within the RUQ, decreased since prior, trace bilateral pleural effusions L>R, scattered areas of ground glass airspace disease within the RML, RLL 7/22 CT Chest w/o Contrast >> Retrosternal fluid collection, non specific.  Bilateral pleural effusions, portions loculated.   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 Stable bilateral mastoid and middle ear effusions.   Micro Data:  See summary- micro tab in EPIC  Antimicrobials:  See summary- fever tab in EPIC  Interim history/subjective:  Sleeping comfortably this AM. Tolerated some PS  yesterday.  Objective   Blood pressure (!) 125/45, pulse 67, temperature 98.1 F (36.7 C), temperature source Oral, resp. rate (!) 22, height _0  (1.753 m), weight 73.4 kg, SpO2 100 %.    Vent Mode: PRVC FiO2 (%):  [40 %-50 %] 40 % Set Rate:  [14 bmp] 14 bmp Vt Set:  [420 mL] 420 mL PEEP:  [5 cmH20] 5 cmH20 Pressure Support:  [15 cmH20] 15 cmH20 Plateau Pressure:  [17 cmH20-22 cmH20] 17 cmH20   Intake/Output Summary (Last 24 hours) at 09/13/2019 4268 Last data filed at 09/13/2019 0600 Gross per 24 hour  Intake 1814.34 ml  Output 3500 ml  Net -1685.66 ml   Filed Weights   09/10/19 0400 09/11/19 0335 09/12/19 0500  Weight: 68.7 kg 68.8 kg 73.4 kg   Exam GEN: jaundiced frail elderly man on vent HEENT: MM dry, tracheostomy CDI with mild thick secrtions CV: RRR, ext warm PULM: Scattered rhonci, triggering vent GI: Soft, +BS EXT: Muscle wasting, trace anasarca, ischemic changes on right finger, scattered bruising NEURO: moves all 4 ext to command, profoundly weak PSYCH: RASS -1 SKIN: Jandiced, bruising as above  Sodium 139>>143 BUN/Cr stable Hgb slightly lower No fevers Good diuresis  Lab Results  Component Value Date   WBC 12.9 (H) 09/13/2019   HGB 6.9 (LL) 09/13/2019   HCT 24.0 (L) 09/13/2019   MCV 105.3 (H) 09/13/2019   PLT 224 09/13/2019   Lab Results  Component Value Date   CREATININE 0.95 09/13/2019   BUN 82 (H) 09/13/2019   NA 143 09/13/2019   K 4.3 09/13/2019   CL 108 09/13/2019   CO2 26 09/13/2019   Resolved Hospital Problem list   Suspected HCAP Acute kidney injury, improved  Hypernatremia, improved  Acute Metabolic Encephalopathy  -CTH without acute intracranial pathology. EEG negative 7/23.  Shock Heparin-induced thrombocytopenia >> resolved, required bivalirudin  Assessment & Plan:   Anxiety -likely ICU related stress disorder - Prozac daily, seroquel qHS, re-orient as able  Acute hypoxic respiratory failure w/ need for mechanical  ventilation - status post tracheostomy- Poor lung mechanics due to weakness, deconditioning   - Continue PS +/- TC trials as able - Work with PT - Will likely need LTACH -continue xopenex -CPT, pulm hygiene -continue BID lasix   Cardiogenic/hemorrhagic shock status post CABG complicated by VT arrest, improved S/p VA ECMO (decanulated 6/28). S/p impella placement (6/28> 7/9) -continue midodrine -Continue diuresis (48m Lasix BID per tube) -follow I/O's  -post operative care per TCTS   Atrial flutter -continue amiodarone, ASA, Eliquis  -tele monitoring    Hyperbilirubinemia due to acalculous cholecystitis Possible cirrhosis per 7/8 [place] Status post  percutaneous GB drain  Jaundice downtrending tbili  -drain care per IR  -Trend tbili, consider removal of drain when normalized  -questran   DM II with fluctuant hypo/hyperglycemia -monitor glucose  -continue TF  -BID levemir + rSSI   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  -ID assistance appreciated  -Diflucan through 8/10  Acute blood loss anemia and critical illness anemia HIT, resolved  P -Continue eliquis  -follow CBC closely -8/5: transfuse 1 unit  Acute kidney injury, improved  Hypernatremia, improved  -Trend BMP / urinary output -Replace electrolytes as indicated -Avoid nephrotoxic agents, ensure adequate renal perfusion -continue free water flushes  Sacral Decubitus  -appreciate WOC assistance    Deconditioning of Critical Illness  -PT / OT efforts  Small left adrenal mass  seen on CT scan of the abdomen August 24, 2019 - OP f/u  Daily Goals Checklist  Pain/Anxiety/Delirium protocol (if indicated): see above  VAP protocol (if indicated): bundle in place.  DVT prophylaxis: eliquis Nutrition Status: TF  GI prophylaxis: PPI Urinary catheter: n/a Central lines: removed Glucose control: See above Mobility/therapy needs: PT for ROM  Code Status: Full    Family Communication: per primary Disposition: ICU -- likely will need LTACH    The patient is critically ill with multiple organ systems failure and requires high complexity decision making for assessment and support, frequent evaluation and titration of therapies, application of advanced monitoring technologies and extensive interpretation of multiple databases. Critical Care Time devoted to patient care services described in this note independent of APP/resident time (if applicable)  is 32 minutes.   Erskine Emery MD [facility] 09/13/2019 7:21 AM Personal pager: [phone] If unanswered, please page CCM On-call: [phone]

## 2019-09-13 NOTE — Progress Notes (Signed)
Physical Therapy Wound Treatment Patient Details  Name: Todd [name] MRN: [id number] Date of Birth: 11-24-1955  Today's Date: 09/13/2019 Time: 3754-3606 Time Calculation (min): 44 min  Subjective  Subjective: unable (vent/trach), smiling intermittently Patient and Family Stated Goals: unable Date of Onset:  (unknown) Prior Treatments: foam dressing  Pain Score: 6/10 (premedicated)  Wound Assessment  Pressure Injury 08/09/19 Sacrum Right Unstageable - Full thickness tissue loss in which the base of the injury is covered by slough (yellow, tan, gray, green or brown) and/or eschar (tan, brown or black) in the wound bed. has evolved into unstageable whe (Active)  Dressing Type ABD;Barrier Film (skin prep);Gauze (Comment);Moist to moist 09/13/19 1324  Dressing Changed;Clean;Dry;Intact 09/13/19 1324  Dressing Change Frequency Daily 09/13/19 1324  State of Healing Non-healing 09/13/19 1324  Site / Wound Assessment Bleeding;Yellow;Red;Pale;Granulation tissue 09/13/19 1324  % Wound base Red or Granulating 10% 09/13/19 1324  % Wound base Yellow/Fibrinous Exudate 90% 09/13/19 1324  % Wound base Black/Eschar 0% 09/13/19 1324  % Wound base Other/Granulation Tissue (Comment) 0% 09/13/19 1324  Peri-wound Assessment Erythema (blanchable) 09/13/19 1324  Wound Length (cm) 8 cm 09/12/19 1208  Wound Width (cm) 5 cm 09/12/19 1208  Wound Depth (cm) 2.5 cm 09/12/19 1208  Wound Surface Area (cm^2) 40 cm^2 09/12/19 1208  Wound Volume (cm^3) 100 cm^3 09/12/19 1208  Margins Unattached edges (unapproximated) 09/13/19 1324  Drainage Amount Moderate 09/13/19 1324  Drainage Description Serosanguineous 09/13/19 1324  Treatment Debridement (Selective);Hydrotherapy (Pulse lavage);Packing (Saline gauze) 09/13/19 1324     Hydrotherapy Pulsed lavage therapy - wound location: sacrum Pulsed Lavage with Suction (psi): 12 psi Pulsed Lavage with Suction - Normal Saline Used: 1000 mL Pulsed Lavage Tip: Tip with splash  shield Selective Debridement Selective Debridement - Location: sacrum Selective Debridement - Tools Used: Forceps;Scalpel Selective Debridement - Tissue Removed: yellow/gray slough   Wound Assessment and Plan  Wound Therapy - Assess/Plan/Recommendations Wound Therapy - Clinical Statement: Continued removal of yellow necrotic tissue, appropaching bone. Pt continues to bleed easily with sharp debridement. Will continue to benefit from hydrotherapy to remove necrotic tissue and decrease bioburden.  Wound Therapy - Functional Problem List: limited positioning options due to pressure wound Factors Delaying/Impairing Wound Healing: Diabetes Mellitus;Incontinence;Immobility;Multiple medical problems Hydrotherapy Plan: Debridement;Dressing change;Patient/family education;Pulsatile lavage with suction Wound Therapy - Frequency: 6X / week Wound Therapy - Follow Up Recommendations: Other (comment) (LTACH (has been denied, and remains appropriate)) Wound Plan: see above  Wound Therapy Goals- Improve the function of patient's integumentary system by progressing the wound(s) through the phases of wound healing (inflammation - proliferation - remodeling) by: Decrease Necrotic Tissue to: 50% Decrease Necrotic Tissue - Progress: Progressing toward goal Increase Granulation Tissue to: 50 Increase Granulation Tissue - Progress: Progressing toward goal Time For Goal Achievement: 7 days Wound Therapy - Potential for Goals: Fair  Goals will be updated until maximal potential achieved or discharge criteria met.  Discharge criteria: when goals achieved, discharge from hospital, MD decision/surgical intervention, no progress towards goals, refusal/missing three consecutive treatments without notification or medical reason.  GP       Wyona Almas, PT, DPT Acute Rehabilitation Services Pager [phone] Office [phone]   Deno Etienne 09/13/2019, 1:30 PM

## 2019-09-13 NOTE — Progress Notes (Signed)
TCTS BRIEF SICU PROGRESS NOTE  27 Days Post-Op  S/P Procedure(s) (LRB): REMOVAL OF IMPELLA LEFT VENTRICULAR ASSIST DEVICE (N/A) TRANSESOPHAGEAL ECHOCARDIOGRAM (TEE) (N/A)   Stable day although back on vent  Plan: Continue current plan  Purcell Nails, MD 09/13/2019 6:09 PM

## 2019-09-13 NOTE — Progress Notes (Signed)
27 Days Post-Op Procedure(s) (LRB): REMOVAL OF IMPELLA LEFT VENTRICULAR ASSIST DEVICE (N/A) TRANSESOPHAGEAL ECHOCARDIOGRAM (TEE) (N/A) Subjective: On TC but c/o feeling SOB, denies pain, emesis x 1 earlier this AM  Objective: Vital signs in last 24 hours: Temp:  [98.1 F (36.7 C)-99.3 F (37.4 C)] 98.6 F (37 C) (08/05 0723) Pulse Rate:  [60-91] 81 (08/05 0852) Cardiac Rhythm: Normal sinus rhythm (08/05 0400) Resp:  [18-42] 24 (08/05 0852) BP: (98-135)/(41-78) 113/52 (08/05 0852) SpO2:  [79 %-100 %] 98 % (08/05 0852) FiO2 (%):  [40 %-60 %] 60 % (08/05 0852)  Hemodynamic parameters for last 24 hours:    Intake/Output from previous day: 08/04 0701 - 08/05 0700 In: 1814.3 [I.V.:204.2; NG/GT:1260; IV Piggyback:350.2] Out: 3500 [Urine:3200; Drains:300] Intake/Output this shift: Total I/O In: -  Out: 110 [Drains:110]  General appearance: alert and anxious Neurologic: intact Heart: regular rate and rhythm Lungs: rhonchi bilaterally Wound: clean and intact  Lab Results: Recent Labs    09/12/19 0304 09/13/19 0211  WBC 11.8* 12.9*  HGB 7.2* 6.9*  HCT 24.6* 24.0*  PLT 236 224   BMET:  Recent Labs    09/12/19 0304 09/13/19 0211  NA 139 143  K 3.6 4.3  CL 105 108  CO2 26 26  GLUCOSE 176* 151*  BUN 76* 82*  CREATININE 0.92 0.95  CALCIUM 7.7* 8.1*    PT/INR: No results for input(s): LABPROT, INR in the last 72 hours. ABG    Component Value Date/Time   PHART 7.416 08/29/2019 1440   HCO3 25.7 08/29/2019 1440   TCO2 27 08/29/2019 1440   ACIDBASEDEF 1.0 08/08/2019 1115   O2SAT 67.1 09/02/2019 0234   CBG (last 3)  Recent Labs    09/12/19 2318 09/13/19 0324 09/13/19 0721  GLUCAP 183* 140* 127*    Assessment/Plan: S/P Procedure(s) (LRB): REMOVAL OF IMPELLA LEFT VENTRICULAR ASSIST DEVICE (N/A) TRANSESOPHAGEAL ECHOCARDIOGRAM (TEE) (N/A) -  NEURO- intact and appropriate CV- in SR on amiodarone RESP- VDRF. Making some progress, tachypneic and anxious  presently. Will try suctioning, will probably need to go back on PS RENAL- creatinine stable, BUN up slightly , will decrease lasix ENDO- CBG reasonably well controlled GI- perc chole drainage down, still jaundiced  Emesis x 1, has been tolerating TF previously, exam benign, follow ID- last day of vanco today, Diflucan through 8/10  Afebrile, WBC stable Anemia- stable Deconditioning- severe Was denied by [facility]   LOS: 49 days    [name] 09/13/2019

## 2019-09-13 NOTE — Progress Notes (Signed)
SLP Cancellation Note  Patient Details Name: Todd [name] MRN: [id number] DOB: May 06, 1955   Cancelled treatment:       Reason Eval/Treat Not Completed: Medical issues which prohibited therapy (Pt was briefly on trach collar this morning but then transitioned back to the vent two hours later per RT Assessment flowsheet. SLP will follow up.)  Luan Urbani I. Vear Clock, MS, CCC-SLP Acute Rehabilitation Services Office number [phone] Pager [phone]  Scheryl Marten 09/13/2019, 5:45 PM

## 2019-09-14 LAB — TYPE AND SCREEN
ABO/RH(D): A POS
Antibody Screen: NEGATIVE
Unit division: 0

## 2019-09-14 LAB — HEPATIC FUNCTION PANEL
ALT: 128 U/L — ABNORMAL HIGH (ref 0–44)
AST: 107 U/L — ABNORMAL HIGH (ref 15–41)
Albumin: 1.5 g/dL — ABNORMAL LOW (ref 3.5–5.0)
Alkaline Phosphatase: 515 U/L — ABNORMAL HIGH (ref 38–126)
Bilirubin, Direct: 3.6 mg/dL — ABNORMAL HIGH (ref 0.0–0.2)
Indirect Bilirubin: 1.9 mg/dL — ABNORMAL HIGH (ref 0.3–0.9)
Total Bilirubin: 5.5 mg/dL — ABNORMAL HIGH (ref 0.3–1.2)
Total Protein: 5.9 g/dL — ABNORMAL LOW (ref 6.5–8.1)

## 2019-09-14 LAB — BASIC METABOLIC PANEL
Anion gap: 11 (ref 5–15)
BUN: 85 mg/dL — ABNORMAL HIGH (ref 8–23)
CO2: 27 mmol/L (ref 22–32)
Calcium: 8.3 mg/dL — ABNORMAL LOW (ref 8.9–10.3)
Chloride: 110 mmol/L (ref 98–111)
Creatinine, Ser: 0.97 mg/dL (ref 0.61–1.24)
GFR calc Af Amer: >60 mL/min (ref >60)
GFR calc non Af Amer: >60 mL/min (ref >60)
Glucose, Bld: 135 mg/dL — ABNORMAL HIGH (ref 70–99)
Potassium: 3.8 mmol/L (ref 3.5–5.1)
Sodium: 148 mmol/L — ABNORMAL HIGH (ref 135–145)

## 2019-09-14 LAB — CBC
HCT: 28.9 % — ABNORMAL LOW (ref 39.0–52.0)
Hemoglobin: 8.3 g/dL — ABNORMAL LOW (ref 13.0–17.0)
MCH: 29.3 pg (ref 26.0–34.0)
MCHC: 28.7 g/dL — ABNORMAL LOW (ref 30.0–36.0)
MCV: 102.1 fL — ABNORMAL HIGH (ref 80.0–100.0)
Platelets: 224 10*3/uL (ref 150–400)
RBC: 2.83 MIL/uL — ABNORMAL LOW (ref 4.22–5.81)
RDW: 20.4 % — ABNORMAL HIGH (ref 11.5–15.5)
WBC: 13.4 10*3/uL — ABNORMAL HIGH (ref 4.0–10.5)
nRBC: 0 % (ref 0.0–0.2)

## 2019-09-14 LAB — GLUCOSE, CAPILLARY
Glucose-Capillary: 113 mg/dL — ABNORMAL HIGH (ref 70–99)
Glucose-Capillary: 156 mg/dL — ABNORMAL HIGH (ref 70–99)
Glucose-Capillary: 163 mg/dL — ABNORMAL HIGH (ref 70–99)
Glucose-Capillary: 173 mg/dL — ABNORMAL HIGH (ref 70–99)
Glucose-Capillary: 192 mg/dL — ABNORMAL HIGH (ref 70–99)
Glucose-Capillary: 194 mg/dL — ABNORMAL HIGH (ref 70–99)
Glucose-Capillary: 216 mg/dL — ABNORMAL HIGH (ref 70–99)

## 2019-09-14 LAB — BPAM RBC
Blood Product Expiration Date: 202108282359
ISSUE DATE / TIME: 202108051306
Unit Type and Rh: 6200

## 2019-09-14 LAB — MAGNESIUM: Magnesium: 2.5 mg/dL — ABNORMAL HIGH (ref 1.7–2.4)

## 2019-09-14 LAB — PHOSPHORUS: Phosphorus: 4.6 mg/dL (ref 2.5–4.6)

## 2019-09-14 MED ORDER — FREE WATER
400.0000 mL | Status: DC
  Administered 2019-09-14 – 2019-09-15 (×7): 400 mL

## 2019-09-14 MED ORDER — FUROSEMIDE 40 MG PO TABS
40.0000 mg | ORAL_TABLET | Freq: Two times a day (BID) | ORAL | Status: DC
  Administered 2019-09-14 – 2019-09-15 (×4): 40 mg
  Filled 2019-09-14 (×4): qty 1

## 2019-09-14 MED ORDER — QUETIAPINE FUMARATE 50 MG PO TABS
50.0000 mg | ORAL_TABLET | Freq: Every day | ORAL | Status: DC
  Administered 2019-09-14 – 2019-10-08 (×25): 50 mg via ORAL
  Filled 2019-09-14 (×26): qty 1

## 2019-09-14 NOTE — Progress Notes (Signed)
28 Days Post-Op Procedure(s) (LRB): REMOVAL OF IMPELLA LEFT VENTRICULAR ASSIST DEVICE (N/A) TRANSESOPHAGEAL ECHOCARDIOGRAM (TEE) (N/A) Subjective: On TC, denies pain, nausea, shortness of breath  Objective: Vital signs in last 24 hours: Temp:  [98.3 F (36.8 C)-99.9 F (37.7 C)] 98.5 F (36.9 C) (08/06 0400) Pulse Rate:  [67-86] 81 (08/06 0726) Cardiac Rhythm: Normal sinus rhythm (08/06 0400) Resp:  [16-32] 24 (08/06 0726) BP: (91-126)/(36-82) 102/62 (08/06 0726) SpO2:  [93 %-100 %] 99 % (08/06 0726) FiO2 (%):  [40 %-60 %] 60 % (08/06 0726) Weight:  [72 kg] 72 kg (08/06 0500)  Hemodynamic parameters for last 24 hours:    Intake/Output from previous day: 08/05 0701 - 08/06 0700 In: 3052.2 [I.V.:430; Blood:402; NG/GT:1820; IV Piggyback:350.2] Out: 3090 [Urine:2550; Drains:540] Intake/Output this shift: No intake/output data recorded.  General appearance: alert, cooperative and no distress Neurologic: intact Heart: regular rate and rhythm Lungs: clear to auscultation bilaterally Abdomen: normal findings: soft, non-tender Wound: clean and intract  Lab Results: Recent Labs    09/13/19 0211 09/14/19 0555  WBC 12.9* 13.4*  HGB 6.9* 8.3*  HCT 24.0* 28.9*  PLT 224 224   BMET:  Recent Labs    09/13/19 0211 09/14/19 0555  NA 143 148*  K 4.3 3.8  CL 108 110  CO2 26 27  GLUCOSE 151* 135*  BUN 82* 85*  CREATININE 0.95 0.97  CALCIUM 8.1* 8.3*    PT/INR: No results for input(s): LABPROT, INR in the last 72 hours. ABG    Component Value Date/Time   PHART 7.416 08/29/2019 1440   HCO3 25.7 08/29/2019 1440   TCO2 27 08/29/2019 1440   ACIDBASEDEF 1.0 08/08/2019 1115   O2SAT 67.1 09/02/2019 0234   CBG (last 3)  Recent Labs    09/13/19 1943 09/14/19 0013 09/14/19 0334  GLUCAP 140* 156* 173*    Assessment/Plan: S/P Procedure(s) (LRB): REMOVAL OF IMPELLA LEFT VENTRICULAR ASSIST DEVICE (N/A) TRANSESOPHAGEAL ECHOCARDIOGRAM (TEE) (N/A) -  CV- stable RESP-  VDRF, continue TC trials per CCM RENAL- BUN up a little more today, decrease Lasix to 40 BID ENDO- CBG reasonably well controlled Anemia - better post transfusion ID- on Diflucan, monitor temp, WBC now that Vanco completed PT/OT/Speech   LOS: 50 days    [name] 09/14/2019

## 2019-09-14 NOTE — Progress Notes (Addendum)
Physical Therapy Treatment Patient Details Name: Todd [name] MRN: [id number] DOB: 02-07-1956 Today's Date: 09/14/2019    History of Present Illness Todd [name] is a 64 y.o. male presenting with shortness of breath; noted AKI, CHF, recent PNA, elevated troponin, pulm edema/effusion, and cardiomegaly.Marland Kitchen CABG 6/24 and intubated (extubated 6/25)--went into cardiac arrest on same date with re-opening of chest for heart massage and direct epicardial paddles and place on ECMO. 08/03/19 washout out for tamponade. 7/2 sternal closure and wound vac. 7/9 Impella removed.08/20/19 Tracheostomy 08/24/19 cholecystostomy tube. 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 PMHx: HFrEF, COPD, HTN, T2DM, HLD, MDD, chronic back pain, vitamin deficiency, BPH, tobacco use disorder, marijuana use disorder    PT Comments    Pt pleasant, alert and reporting sacral pain. Pt with initial tilt performed this session with pt able to tilt to 30 degrees for 5 min then additional 5 min at 40degrees. Pt tolerating well with decreased pain in sacrum in tilt and able to move arms to reach head in standing and perform mini squats. Pt positioned in chair position end of session with trunk supported in midline. Encouraged tilt with RN at least daily.   Supine 118/56 (74), HR 81 Tilt at 30 degrees 101/54 (67) Initial 40 degrees 94/48 (58) HR 83 Trach collar 10 L 60%    Follow Up Recommendations  LTACH;Supervision/Assistance - 24 hour     Equipment Recommendations  None recommended by PT    Recommendations for Other Services       Precautions / Restrictions Precautions Precautions: Fall;Sternal;Other (comment) Precaution Comments: vent, chole drain, sacral wound, trach    Mobility  Bed Mobility Overal bed mobility: Needs Assistance Bed Mobility: Rolling;Supine to Sit;Sit to Supine Rolling: Max assist   Supine to sit: Total assist;+2 for physical assistance Sit to supine: Total assist;+2 for  physical assistance   General bed mobility comments: mod assist to roll x 3 trials with physical assist to bend knee and rotate trunk and pelvis. Bed tilt for transition to and from standing. Total +2 to slide toward HOB. Bed placed in semi chair end of session with pillow under left hip for pressure relief  Transfers                    Ambulation/Gait                 Stairs             Wheelchair Mobility    Modified Rankin (Stroke Patients Only)       Balance Overall balance assessment: Needs assistance                                          Cognition Arousal/Alertness: Awake/alert Behavior During Therapy: Flat affect Overall Cognitive Status: Difficult to assess Area of Impairment: Following commands;Attention;Memory;Safety/judgement;Orientation                 Orientation Level: Disoriented to;Time;Situation Current Attention Level: Sustained   Following Commands: Follows one step commands inconsistently;Follows one step commands with increased time Safety/Judgement: Decreased awareness of safety;Decreased awareness of deficits   Problem Solving: Slow processing;Decreased initiation;Difficulty sequencing;Requires verbal cues;Requires tactile cues General Comments: pt able to follow one step commands today and nod head but at times mouthing words and unable to discern statements with pt unable to legibly write (unsure if illiterate)  Exercises General Exercises - Lower Extremity Mini-Sqauts: AROM;Both;Standing;10 reps    General Comments        Pertinent Vitals/Pain Pain Score: 8  Pain Location: sacrum Pain Descriptors / Indicators: Grimacing;Discomfort Pain Intervention(s): Limited activity within patient's tolerance;Monitored during session;Repositioned;Patient requesting pain meds-RN notified    Home Living                      Prior Function            PT Goals (current goals can now be  found in the care plan section) Progress towards PT goals: Progressing toward goals    Frequency    Min 2X/week      PT Plan Current plan remains appropriate    Co-evaluation PT/OT/SLP Co-Evaluation/Treatment: Yes Reason for Co-Treatment: Complexity of the patient's impairments (multi-system involvement);For patient/therapist safety PT goals addressed during session: Mobility/safety with mobility;Strengthening/ROM        AM-PAC PT "6 Clicks" Mobility   Outcome Measure  Help needed turning from your back to your side while in a flat bed without using bedrails?: Total Help needed moving from lying on your back to sitting on the side of a flat bed without using bedrails?: Total Help needed moving to and from a bed to a chair (including a wheelchair)?: Total Help needed standing up from a chair using your arms (e.g., wheelchair or bedside chair)?: Total Help needed to walk in hospital room?: Total Help needed climbing 3-5 steps with a railing? : Total 6 Click Score: 6    End of Session   Activity Tolerance: Patient tolerated treatment well Patient left: with call bell/phone within reach;in bed;with nursing/sitter in room Nurse Communication: Mobility status;Other (comment) (education for nursing tilt throughout the day) PT Visit Diagnosis: Unsteadiness on feet (R26.81);Muscle weakness (generalized) (M62.81);Difficulty in walking, not elsewhere classified (R26.2)     Time: 0630-1601 PT Time Calculation (min) (ACUTE ONLY): 31 min  Charges:  $Therapeutic Activity: 8-22 mins                     [name], PT Acute Rehabilitation Services Pager: [phone] Office: [phone]    [name] 09/14/2019, 12:12 PM

## 2019-09-14 NOTE — Progress Notes (Signed)
NAME:  Todd [name], MRN:  [id number], DOB:  Jul 28, 1955, LOS: 41 ADMISSION DATE:  07/26/2019, CONSULTATION DATE:  08/03/19 REFERRING MD:  Roxan Hockey, CHIEF COMPLAINT:  ECMO   Brief History   64 yo male admitted with CHF exacerbation, found to have multi-vessel disease requiring CABG 6/24, post op VT Arrest 6/25 and cannulated for VA ECMO.    History of present illness   Presented with worsening dyspnea 6/17 c/w CHF exacerbation.  LHC  with severe triple vessel disease.  Underwent CABG 6/24. Vtach arrest 6/25.  Chest opened bedside and cardiac massage initiated as well as multiple cardioversions, amiodarone, bicarb etc.  Brought to OR and cannulated for VA ECMO.  PCCM consulted to assist with management  Comorbidities include DM, heavy smoking, COPD  Past Medical History  Depression Ischemic cardiomyopathy HTN HLD  Significant Hospital Events   6/24 CABG 6/25 VA cannulation 6/28 decannulated and impella placed 6/29 bedside re-exploration; s/p decannulation. Placement of impella device originally at p8 but decreased to p4 2/2 suction events overnight up to p6. Echo completed and repositioned device. Remained on considerable support with 8 epi and 46 norepi vaso 0.05. continued chest tube output with large clots noted. Heparin thru device. Replacement products ongoing. 60% 8 peep 6/30 bedside mediastinal re-exploration and clean out of hematoma with tamponade and worsening hemodynamics. Pt had large volume transfusion. rebolused with amio 2/2 nsvt episode.  Weight up 48 pounds.  Started diuresis, Lasix drip started. 7/01 iatrogenic respiratory alkalosis, vent rate decreased. 7/02 No significant issues overnight remains on pressors and Impella not tolerating tube feeds with high gastric output awaiting core track placement 7/03 started trickle TF  7/05 Swab removed, CVL and PICC placed. 7/13 Trach and BAL 7/16 Awake and tracking. No follow commands. Still having fevers. Successful  ultrasound and CT guided placement of a 10.2 French cholecystostomy tube. A small amount of aspirated bile was capped and sent to the laboratory for analysis 7/17 Off sedation, PSV.  Beginning to follow some commands occasionally intermittently.  Very jaundiced.  Growing Candida in the blood and BAL > diflucan.   7/21 Ongoing fevers, WBC increased 7/22 Persistent fevers, pan cultured with CT Chest/Head. Suspected source tracheobronchitis.  7/28 Transitioned to eliquis, jaundiced but labs improving  7/29 Agitation/restless.  Trazodone added QHS. Weaning midodrine, attempting diuresis  7/30 Increased Na, weaning on PSV.  8/01 On 40% ATC, significantly improved mental status  Consults:  Advanced heart failure, PCCM, TCTS  Procedures:  LUE PICC 7/5 >>7/21 R Brachial ALine 7/9 >>7/27 Chest Tubes >> L IJ CVC 7/21 >>7/27 Peg tube 7/27 >>  Significant Diagnostic Tests:  6/22 spirometry with restrictive physiology, preserved FEV1/FVC 6/18 echo: LVEF 01-00%, grade I diastolic dysfunction 7/12 US Abdomen cholelithiasis without cholecystitis. Mild wall thinking in setting of liver disease and ascites.  7/10 LE [place] negative for DVT  7/20 CT ABD w contrast >> stable position of the percutaneous cholecystostomy tube with complete decompression of the gallbladder, trace residual free fluid within the RUQ, decreased since prior, trace bilateral pleural effusions L>R, scattered areas of ground glass airspace disease within the RML, RLL 7/22 CT Chest w/o Contrast >> Retrosternal fluid collection, non specific.  Bilateral pleural effusions, portions loculated.   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 Stable bilateral mastoid and middle ear effusions.   Micro Data:  See summary- micro tab in EPIC  Antimicrobials:  See summary- fever tab in EPIC  Interim history/subjective:  He looks good this AM! Denies pain. Did not  sleep well.  Objective   Blood pressure (!) 107/49, pulse 78, temperature 98.5 F (36.9 C), temperature source Oral, resp. rate (!) 22, height _0  (1.753 m), weight 72 kg, SpO2 98 %.    Vent Mode: PRVC FiO2 (%):  [40 %-60 %] 40 % Set Rate:  [14 bmp] 14 bmp Vt Set:  [420 mL] 420 mL PEEP:  [5 cmH20] 5 cmH20 Plateau Pressure:  [20 YYT03-54 cmH20] 21 cmH20   Intake/Output Summary (Last 24 hours) at 09/14/2019 6568 Last data filed at 09/14/2019 0600 Gross per 24 hour  Intake 3052.2 ml  Output 3090 ml  Net -37.8 ml   Filed Weights   09/11/19 0335 09/12/19 0500 09/14/19 0500  Weight: 68.8 kg 73.4 kg 72 kg   Exam GEN: jaundiced frail elderly man on vent HEENT: MM dry, tracheostomy CDI with some bloody secretions CV: RRR, ext warm PULM: Scattered rhonci, triggering vent GI: Soft, +BS EXT: Muscle wasting,  ischemic changes on right finger, scattered bruising NEURO: moves all 4 ext to command, profoundly weak PSYCH: RASS -1 SKIN: Jandiced, bruising as above  540 out GB drain Sodium 139>>143>>148 BUN/Cr stable Hgb responded to unit of blood No fevers Net even No new imaging  Lab Results  Component Value Date   WBC 13.4 (H) 09/14/2019   HGB 8.3 (L) 09/14/2019   HCT 28.9 (L) 09/14/2019   MCV 102.1 (H) 09/14/2019   PLT 224 09/14/2019   Lab Results  Component Value Date   CREATININE 0.97 09/14/2019   BUN 85 (H) 09/14/2019   NA 148 (H) 09/14/2019   K 3.8 09/14/2019   CL 110 09/14/2019   CO2 27 09/14/2019   Resolved Hospital Problem list   Suspected HCAP Acute kidney injury, improved  Hypernatremia, improved  Acute Metabolic Encephalopathy  -CTH without acute intracranial pathology. EEG negative 7/23.  Shock Heparin-induced thrombocytopenia >> resolved, required bivalirudin  Assessment & Plan:   Anxiety -likely ICU related stress disorder - Prozac daily, seroquel qHS, re-orient as able, increase qhs seroquel to help with sleep  Acute hypoxic respiratory failure  w/ need for mechanical ventilation - status post tracheostomy- Poor lung mechanics due to weakness, deconditioning   - Continue PS +/- TC trials as able - Work with PT - Will likely need LTACH -continue xopenex -CPT, pulm hygiene -continue BID lasix   Cardiogenic/hemorrhagic shock status post CABG complicated by VT arrest, improved S/p VA ECMO (decanulated 6/28). S/p impella placement (6/28> 7/9) -continue midodrine -Continue diuresis (42m Lasix BID per tube) -follow I/O's  -post operative care per TCTS   Atrial flutter -continue amiodarone, ASA, Eliquis  -tele monitoring    Hyperbilirubinemia due to acalculous cholecystitis Possible cirrhosis per 7/8 [place] Status post  percutaneous GB drain  Jaundice downtrending tbili  -drain care per IR  -Trend tbili, consider removal of drain when normalized   DM II with fluctuant hypo/hyperglycemia -monitor glucose  -continue TF  -BID levemir + rSSI   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  -ID assistance appreciated  -Diflucan through 8/10  Acute blood loss anemia and critical illness anemia HIT, resolved  P -Continue eliquis  -follow CBC closely -8/5: transfuse 1 unit  Acute kidney injury, improved  Hypernatremia, improved  -Trend BMP / urinary output -Replace electrolytes as indicated -Avoid nephrotoxic agents, ensure adequate renal perfusion -continue free water flushes  Sacral Decubitus  -appreciate WOC assistance    Deconditioning of Critical  Illness  -PT / OT efforts  Small left adrenal mass seen on CT scan of the abdomen August 24, 2019 - OP f/u  Daily Goals Checklist  Pain/Anxiety/Delirium protocol (if indicated): see above  VAP protocol (if indicated): bundle in place.  DVT prophylaxis: eliquis Nutrition Status: TF  GI prophylaxis: PPI Urinary catheter: n/a Central lines: removed Glucose control: See above Mobility/therapy needs: PT for ROM  Code Status:  Full  Family Communication: per primary Disposition: ICU -- likely will need LTACH    The patient is critically ill with multiple organ systems failure and requires high complexity decision making for assessment and support, frequent evaluation and titration of therapies, application of advanced monitoring technologies and extensive interpretation of multiple databases. Critical Care Time devoted to patient care services described in this note independent of APP/resident time (if applicable)  is 31 minutes.   Erskine Emery MD [facility] 09/14/2019 7:07 AM Personal pager: [phone] If unanswered, please page CCM On-call: [phone]

## 2019-09-14 NOTE — Progress Notes (Signed)
       [address]       [place] [address]             [phone]      No new issues today Only on TC briefly Sacral decub as noted by PT Continue present care  Viviann Spare C. Dorris Fetch, MD [facility] [phone]

## 2019-09-14 NOTE — Progress Notes (Signed)
Occupational Therapy Treatment Patient Details Name: Todd [name] MRN: [id number] DOB: [birth year] Today's Date: 09/14/2019    History of present illness Todd [name] is a 64 y.o. male presenting with shortness of breath; noted AKI, CHF, recent PNA, elevated troponin, pulm edema/effusion, and cardiomegaly.Marland Kitchen CABG 6/24 and intubated (extubated 6/25)--went into cardiac arrest on same date with re-opening of chest for heart massage and direct epicardial paddles and place on ECMO. 08/03/19 washout out for tamponade. 7/2 sternal closure and wound vac. 7/9 Impella removed.08/20/19 Tracheostomy 08/24/19 cholecystostomy tube. 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 PMHx: HFrEF, COPD, HTN, T2DM, HLD, MDD, chronic back pain, vitamin deficiency, BPH, tobacco use disorder, marijuana use disorder   OT comments  Pt with sacral pain relieved with standing with use of tilt bed. Tolerated at 30 then 40 degrees with vitals monitored throughout for total of 10 minutes. Performed face washing and B UE AAROM in standing. Pt positioned in chair position at end of session.   Follow Up Recommendations  LTACH;Supervision/Assistance - 24 hour    Equipment Recommendations  Other (comment) (defer to next venue)    Recommendations for Other Services      Precautions / Restrictions Precautions Precautions: Fall;Sternal Precaution Comments: trach, chole drain, sacral wound       Mobility Bed Mobility Overal bed mobility: Needs Assistance Bed Mobility: Rolling;Supine to Sit;Sit to Supine Rolling: Max assist   Supine to sit: Total assist;+2 for physical assistance Sit to supine: Total assist;+2 for physical assistance   General bed mobility comments: mod assist to roll x 3 trials with physical assist to bend knee and rotate trunk and pelvis. Bed tilt for transition to and from standing. Total +2 to slide toward HOB. Bed placed in semi chair end of session with pillow under left hip for  pressure relief  Transfers Overall transfer level: Needs assistance               General transfer comment: used features of kreg bed to raise pt into standing at 30 then 41 degrees, tolerated x 10 minutes    Balance Overall balance assessment: Needs assistance   Sitting balance-Leahy Scale: Zero                                     ADL either performed or assessed with clinical judgement   ADL Overall ADL's : Needs assistance/impaired     Grooming: Wash/dry face;Moderate assistance;Sitting               Lower Body Dressing: Total assistance;Bed level                       Vision       Perception     Praxis      Cognition Arousal/Alertness: Awake/alert Behavior During Therapy: Flat affect Overall Cognitive Status: Difficult to assess Area of Impairment: Following commands;Attention;Memory;Safety/judgement;Orientation                 Orientation Level: Disoriented to;Time;Situation Current Attention Level: Sustained   Following Commands: Follows one step commands inconsistently;Follows one step commands with increased time Safety/Judgement: Decreased awareness of safety;Decreased awareness of deficits Awareness: Intellectual Problem Solving: Slow processing;Decreased initiation;Difficulty sequencing;Requires verbal cues;Requires tactile cues General Comments: pt able to follow one step commands today and nod head but at times mouthing words and unable to discern statements with pt unable to legibly  write (unsure if illiterate)        Exercises General Exercises - Upper Extremity Shoulder Flexion: AAROM;Both;10 reps;Standing    Shoulder Instructions       General Comments      Pertinent Vitals/ Pain       Pain Assessment: Faces Pain Score: 8  Faces Pain Scale: Hurts even more Pain Location: sacrum Pain Descriptors / Indicators: Grimacing;Discomfort Pain Intervention(s): Repositioned;Monitored during  session;Patient requesting pain meds-RN notified;Premedicated before session  Home Living                                          Prior Functioning/Environment              Frequency  Min 2X/week        Progress Toward Goals  OT Goals(current goals can now be found in the care plan section)  Progress towards OT goals: Progressing toward goals  Acute Rehab OT Goals Patient Stated Goal: pt unable to state OT Goal Formulation: Patient unable to participate in goal setting Time For Goal Achievement: 09/16/19 Potential to Achieve Goals: Fair  Plan Discharge plan remains appropriate    Co-evaluation    PT/OT/SLP Co-Evaluation/Treatment: Yes Reason for Co-Treatment: Complexity of the patient's impairments (multi-system involvement);For patient/therapist safety PT goals addressed during session: Mobility/safety with mobility;Strengthening/ROM OT goals addressed during session: Strengthening/ROM;ADL's and self-care      AM-PAC OT "6 Clicks" Daily Activity     Outcome Measure   Help from another person eating meals?: Total Help from another person taking care of personal grooming?: A Little Help from another person toileting, which includes using toliet, bedpan, or urinal?: Total Help from another person bathing (including washing, rinsing, drying)?: Total Help from another person to put on and taking off regular upper body clothing?: Total Help from another person to put on and taking off regular lower body clothing?: Total 6 Click Score: 8    End of Session Equipment Utilized During Treatment: Oxygen  OT Visit Diagnosis: Other abnormalities of gait and mobility (R26.89);Muscle weakness (generalized) (M62.81);Other symptoms and signs involving cognitive function;Pain   Activity Tolerance Patient tolerated treatment well   Patient Left in bed;with call bell/phone within reach;with nursing/sitter in room (bed in chair positiojn)   Nurse  Communication Mobility status        Time: 0076-2263 OT Time Calculation (min): 27 min  Charges: OT General Charges $OT Visit: 1 Visit OT Treatments $Therapeutic Activity: 8-22 mins  Martie Round, OTR/L Acute Rehabilitation Services Pager: [phone] Office: [phone]   Evern Bio 09/14/2019, 1:45 PM

## 2019-09-14 NOTE — Progress Notes (Signed)
Physical Therapy Wound Treatment Patient Details  Name: Todd [name] MRN: [id number] Date of Birth: 08-Dec-1955  Today's Date: 09/14/2019 Time: 4098-1191 Time Calculation (min): 42 min  Subjective  Subjective: unable (vent/trach), smiling intermittently Patient and Family Stated Goals: unable Date of Onset:  (unknown) Prior Treatments: foam dressing  Pain Score:    Wound Assessment  Pressure Injury 08/09/19 Sacrum Right Unstageable - Full thickness tissue loss in which the base of the injury is covered by slough (yellow, tan, gray, green or brown) and/or eschar (tan, brown or black) in the wound bed. has evolved into unstageable whe (Active)  Wound Image   09/11/19 1625  Dressing Type ABD;Barrier Film (skin prep);Gauze (Comment) 09/14/19 1642  Dressing Changed;Clean;Dry 09/14/19 1642  Dressing Change Frequency Daily 09/14/19 1642  State of Healing Eschar 09/14/19 1642  Site / Wound Assessment Bleeding;Yellow 09/14/19 1642  % Wound base Red or Granulating 90% 09/14/19 1642  % Wound base Yellow/Fibrinous Exudate 10% 09/14/19 1642  % Wound base Black/Eschar 0% 09/14/19 1642  % Wound base Other/Granulation Tissue (Comment) 0% 09/14/19 1642  Peri-wound Assessment Erythema (blanchable);Pink;Intact 09/14/19 1000  Wound Length (cm) 8 cm 09/12/19 1208  Wound Width (cm) 5 cm 09/12/19 1208  Wound Depth (cm) 2.5 cm 09/12/19 1208  Wound Surface Area (cm^2) 40 cm^2 09/12/19 1208  Wound Volume (cm^3) 100 cm^3 09/12/19 1208  Margins Unattached edges (unapproximated) 09/14/19 1642  Drainage Amount Moderate 09/14/19 1642  Drainage Description Serous;Serosanguineous 09/14/19 1642  Treatment Cleansed;Debridement (Selective);Hydrotherapy (Pulse lavage);Packing (Saline gauze);Other (Comment) 09/14/19 1642  Santyl applied to wound bed prior to applying dressing.    Hydrotherapy Pulsed lavage therapy - wound location: sacrum Pulsed Lavage with Suction (psi): 12 psi Pulsed Lavage with Suction -  Normal Saline Used: 1000 mL Pulsed Lavage Tip: Tip with splash shield Selective Debridement Selective Debridement - Location: sacrum Selective Debridement - Tools Used: Forceps;Scalpel Selective Debridement - Tissue Removed: yellow/gray slough   Wound Assessment and Plan  Wound Therapy - Assess/Plan/Recommendations Wound Therapy - Clinical Statement: Continued removal of yellow necrotic tissue, appropaching bone. Pt continues to bleed easily with sharp debridement. Will continue to benefit from hydrotherapy to remove necrotic tissue and decrease bioburden.  Wound Therapy - Functional Problem List: limited positioning options due to pressure wound Factors Delaying/Impairing Wound Healing: Diabetes Mellitus;Incontinence;Immobility;Multiple medical problems Hydrotherapy Plan: Debridement;Dressing change;Patient/family education;Pulsatile lavage with suction Wound Therapy - Frequency: 6X / week Wound Therapy - Follow Up Recommendations: Other (comment) (LTACH (has been denied, and remains appropriate)) Wound Plan: see above  Wound Therapy Goals- Improve the function of patient's integumentary system by progressing the wound(s) through the phases of wound healing (inflammation - proliferation - remodeling) by: Decrease Necrotic Tissue to: 50% Decrease Necrotic Tissue - Progress: Progressing toward goal Increase Granulation Tissue to: 50 Increase Granulation Tissue - Progress: Progressing toward goal Time For Goal Achievement: 7 days Wound Therapy - Potential for Goals: Fair  Goals will be updated until maximal potential achieved or discharge criteria met.  Discharge criteria: when goals achieved, discharge from hospital, MD decision/surgical intervention, no progress towards goals, refusal/missing three consecutive treatments without notification or medical reason.  GP     Todd [name] 09/14/2019, 5:12 PM  09/14/2019  Todd Carne., PT Acute Rehabilitation Services [phone]   (pager) [phone]  (office)

## 2019-09-15 ENCOUNTER — Inpatient Hospital Stay (HOSPITAL_COMMUNITY): Payer: Medicare Other

## 2019-09-15 LAB — BASIC METABOLIC PANEL
Anion gap: 10 (ref 5–15)
BUN: 86 mg/dL — ABNORMAL HIGH (ref 8–23)
CO2: 30 mmol/L (ref 22–32)
Calcium: 8.4 mg/dL — ABNORMAL LOW (ref 8.9–10.3)
Chloride: 108 mmol/L (ref 98–111)
Creatinine, Ser: 1.03 mg/dL (ref 0.61–1.24)
GFR calc Af Amer: >60 mL/min (ref >60)
GFR calc non Af Amer: >60 mL/min (ref >60)
Glucose, Bld: 187 mg/dL — ABNORMAL HIGH (ref 70–99)
Potassium: 3.8 mmol/L (ref 3.5–5.1)
Sodium: 148 mmol/L — ABNORMAL HIGH (ref 135–145)

## 2019-09-15 LAB — GLUCOSE, CAPILLARY
Glucose-Capillary: 152 mg/dL — ABNORMAL HIGH (ref 70–99)
Glucose-Capillary: 224 mg/dL — ABNORMAL HIGH (ref 70–99)
Glucose-Capillary: 180 mg/dL — ABNORMAL HIGH (ref 70–99)
Glucose-Capillary: 174 mg/dL — ABNORMAL HIGH (ref 70–99)
Glucose-Capillary: 176 mg/dL — ABNORMAL HIGH (ref 70–99)
Glucose-Capillary: 172 mg/dL — ABNORMAL HIGH (ref 70–99)

## 2019-09-15 LAB — HEPATIC FUNCTION PANEL
ALT: 129 U/L — ABNORMAL HIGH (ref 0–44)
AST: 103 U/L — ABNORMAL HIGH (ref 15–41)
Albumin: 1.4 g/dL — ABNORMAL LOW (ref 3.5–5.0)
Alkaline Phosphatase: 558 U/L — ABNORMAL HIGH (ref 38–126)
Bilirubin, Direct: 3.5 mg/dL — ABNORMAL HIGH (ref 0.0–0.2)
Indirect Bilirubin: 2.3 mg/dL — ABNORMAL HIGH (ref 0.3–0.9)
Total Bilirubin: 5.8 mg/dL — ABNORMAL HIGH (ref 0.3–1.2)
Total Protein: 5.9 g/dL — ABNORMAL LOW (ref 6.5–8.1)

## 2019-09-15 LAB — CBC
HCT: 27.8 % — ABNORMAL LOW (ref 39.0–52.0)
Hemoglobin: 8.1 g/dL — ABNORMAL LOW (ref 13.0–17.0)
MCH: 29.9 pg (ref 26.0–34.0)
MCHC: 29.1 g/dL — ABNORMAL LOW (ref 30.0–36.0)
MCV: 102.6 fL — ABNORMAL HIGH (ref 80.0–100.0)
Platelets: 228 10*3/uL (ref 150–400)
RBC: 2.71 MIL/uL — ABNORMAL LOW (ref 4.22–5.81)
RDW: 20 % — ABNORMAL HIGH (ref 11.5–15.5)
WBC: 13.6 10*3/uL — ABNORMAL HIGH (ref 4.0–10.5)
nRBC: 0 % (ref 0.0–0.2)

## 2019-09-15 LAB — MAGNESIUM: Magnesium: 2.3 mg/dL (ref 1.7–2.4)

## 2019-09-15 LAB — PHOSPHORUS: Phosphorus: 3.9 mg/dL (ref 2.5–4.6)

## 2019-09-15 MED ORDER — CLONAZEPAM 1 MG PO TABS
1.0000 mg | ORAL_TABLET | Freq: Two times a day (BID) | ORAL | Status: DC
  Administered 2019-09-15 – 2019-09-25 (×22): 1 mg via ORAL
  Filled 2019-09-15 (×22): qty 1

## 2019-09-15 MED ORDER — POTASSIUM CHLORIDE 20 MEQ/15ML (10%) PO SOLN
40.0000 meq | Freq: Once | ORAL | Status: AC
  Administered 2019-09-15: 40 meq via ORAL
  Filled 2019-09-15: qty 30

## 2019-09-15 MED ORDER — METOLAZONE 5 MG PO TABS
5.0000 mg | ORAL_TABLET | Freq: Once | ORAL | Status: AC
  Administered 2019-09-15: 5 mg via ORAL
  Filled 2019-09-15: qty 1

## 2019-09-15 MED ORDER — FREE WATER
400.0000 mL | Status: DC
  Administered 2019-09-15 – 2019-09-17 (×22): 400 mL

## 2019-09-15 MED ORDER — ALBUMIN HUMAN 25 % IV SOLN
25.0000 g | Freq: Four times a day (QID) | INTRAVENOUS | Status: AC
  Administered 2019-09-15 – 2019-09-16 (×4): 25 g via INTRAVENOUS
  Filled 2019-09-15 (×4): qty 100

## 2019-09-15 MED ORDER — IPRATROPIUM-ALBUTEROL 0.5-2.5 (3) MG/3ML IN SOLN
3.0000 mL | Freq: Three times a day (TID) | RESPIRATORY_TRACT | Status: DC
  Administered 2019-09-15 – 2019-10-09 (×72): 3 mL via RESPIRATORY_TRACT
  Filled 2019-09-15 (×71): qty 3

## 2019-09-15 NOTE — Progress Notes (Signed)
NAME:  Todd [name], MRN:  [id number], DOB:  1956/02/01, LOS: 65 ADMISSION DATE:  07/26/2019, CONSULTATION DATE:  08/03/19 REFERRING MD:  Roxan Hockey, CHIEF COMPLAINT:  ECMO   Brief History   64 yo male admitted with CHF exacerbation, found to have multi-vessel disease requiring CABG 6/24, post op VT Arrest 6/25 and cannulated for VA ECMO.    History of present illness   Presented with worsening dyspnea 6/17 c/w CHF exacerbation.  LHC  with severe triple vessel disease.  Underwent CABG 6/24. Vtach arrest 6/25.  Chest opened bedside and cardiac massage initiated as well as multiple cardioversions, amiodarone, bicarb etc.  Brought to OR and cannulated for VA ECMO.  PCCM consulted to assist with management  Comorbidities include DM, heavy smoking, COPD  Past Medical History  Depression Ischemic cardiomyopathy HTN HLD  Significant Hospital Events   6/24 CABG 6/25 VA cannulation 6/28 decannulated and impella placed 6/29 bedside re-exploration; s/p decannulation. Placement of impella device originally at p8 but decreased to p4 2/2 suction events overnight up to p6. Echo completed and repositioned device. Remained on considerable support with 8 epi and 46 norepi vaso 0.05. continued chest tube output with large clots noted. Heparin thru device. Replacement products ongoing. 60% 8 peep 6/30 bedside mediastinal re-exploration and clean out of hematoma with tamponade and worsening hemodynamics. Pt had large volume transfusion. rebolused with amio 2/2 nsvt episode.  Weight up 48 pounds.  Started diuresis, Lasix drip started. 7/01 iatrogenic respiratory alkalosis, vent rate decreased. 7/02 No significant issues overnight remains on pressors and Impella not tolerating tube feeds with high gastric output awaiting core track placement 7/03 started trickle TF  7/05 Swab removed, CVL and PICC placed. 7/13 Trach and BAL 7/16 Awake and tracking. No follow commands. Still having fevers. Successful  ultrasound and CT guided placement of a 10.2 French cholecystostomy tube. A small amount of aspirated bile was capped and sent to the laboratory for analysis 7/17 Off sedation, PSV.  Beginning to follow some commands occasionally intermittently.  Very jaundiced.  Growing Candida in the blood and BAL > diflucan.   7/21 Ongoing fevers, WBC increased 7/22 Persistent fevers, pan cultured with CT Chest/Head. Suspected source tracheobronchitis.  7/28 Transitioned to eliquis, jaundiced but labs improving  7/29 Agitation/restless.  Trazodone added QHS. Weaning midodrine, attempting diuresis  7/30 Increased Na, weaning on PSV.  8/01 On 40% ATC, significantly improved mental status  Consults:  Advanced heart failure, PCCM, TCTS  Procedures:  LUE PICC 7/5 >>7/21 R Brachial ALine 7/9 >>7/27 Chest Tubes >> L IJ CVC 7/21 >>7/27 Peg tube 7/27 >>  Significant Diagnostic Tests:  6/22 spirometry with restrictive physiology, preserved FEV1/FVC 6/18 echo: LVEF 33-82%, grade I diastolic dysfunction 5/05 US Abdomen cholelithiasis without cholecystitis. Mild wall thinking in setting of liver disease and ascites.  7/10 LE [place] negative for DVT  7/20 CT ABD w contrast >> stable position of the percutaneous cholecystostomy tube with complete decompression of the gallbladder, trace residual free fluid within the RUQ, decreased since prior, trace bilateral pleural effusions L>R, scattered areas of ground glass airspace disease within the RML, RLL 7/22 CT Chest w/o Contrast >> Retrosternal fluid collection, non specific.  Bilateral pleural effusions, portions loculated.   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 Stable bilateral mastoid and middle ear effusions.   Micro Data:  See summary- micro tab in EPIC  Antimicrobials:  See summary- fever tab in EPIC  Interim history/subjective:  Anxious this morning. Has some back pain. In  PS.  Objective   Blood pressure (!) 95/40, pulse 81, temperature 98 F (36.7 C), resp. rate (!) 25, height _0  (1.753 m), weight 73.5 kg, SpO2 100 %.    Vent Mode: PSV;CPAP FiO2 (%):  [50 %-60 %] 50 % Set Rate:  [14 bmp] 14 bmp Vt Set:  [420 mL] 420 mL PEEP:  [5 cmH20-10 cmH20] 10 cmH20   Intake/Output Summary (Last 24 hours) at 09/15/2019 1003 Last data filed at 09/15/2019 0700 Gross per 24 hour  Intake 1858.67 ml  Output 1335 ml  Net 523.67 ml   Filed Weights   09/12/19 0500 09/14/19 0500 09/15/19 0500  Weight: 73.4 kg 72 kg 73.5 kg   Exam GEN: jaundiced frail elderly man on vent HEENT: MM dry, tracheostomy CDI  CV: RRR, ext warm PULM: Scattered rhonci, triggering vent GI: Soft, +BS EXT: Muscle wasting,  ischemic changes on right finger, scattered bruising NEURO: moves all 4 ext to command, profoundly weak PSYCH: RASS -1 SKIN: Jandiced, bruising as above  415cc out GB drain Sodium 139>>143>>148>>148 BUN/Cr stable Hgb stable No fevers Net even CXR with a little worse airspace on R  Lab Results  Component Value Date   WBC 13.6 (H) 09/15/2019   HGB 8.1 (L) 09/15/2019   HCT 27.8 (L) 09/15/2019   MCV 102.6 (H) 09/15/2019   PLT 228 09/15/2019   Lab Results  Component Value Date   CREATININE 1.03 09/15/2019   BUN 86 (H) 09/15/2019   NA 148 (H) 09/15/2019   K 3.8 09/15/2019   CL 108 09/15/2019   CO2 30 09/15/2019   Resolved Hospital Problem list   Suspected HCAP Acute kidney injury, improved  Hypernatremia, improved  Acute Metabolic Encephalopathy  -CTH without acute intracranial pathology. EEG negative 7/23.  Shock Heparin-induced thrombocytopenia >> resolved, required bivalirudin  Assessment & Plan:   Anxiety -likely ICU related stress disorder - Prozac daily, seroquel qHS, re-orient as able - retrial of klonipin  Acute hypoxic respiratory failure w/ need for mechanical ventilation - status post tracheostomy- Poor lung mechanics due to weakness,  deconditioning   - Continue PS +/- TC trials as able - Work with PT - Will likely need LTACH - Continue xopenex - CPT, pulm hygiene - Continue BID lasix   Cardiogenic/hemorrhagic shock status post CABG complicated by VT arrest, improved S/p VA ECMO (decanulated 6/28). S/p impella placement (6/28> 7/9) -continue midodrine -Continue diuresis (27m Lasix BID per tube) -follow I/O's  -post operative care per TCTS   Atrial flutter -continue amiodarone, ASA, Eliquis  -tele monitoring    Hyperbilirubinemia due to acalculous cholecystitis Possible cirrhosis per 7/8 [place] Status post  percutaneous GB drain  Jaundice downtrending tbili  -drain care per IR  -Trend tbili, consider removal of drain when normalized   DM II with fluctuant hypo/hyperglycemia -monitor glucose  -continue TF  -BID levemir + rSSI   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  -ID assistance appreciated  -Diflucan through 8/10  Acute blood loss anemia and critical illness anemia HIT, resolved  P -Continue eliquis  -follow CBC closely -8/5: transfuse 1 unit  Acute kidney injury, improved  Hypernatremia, improved  -Trend BMP / urinary output -Replace electrolytes as indicated -Avoid nephrotoxic agents, ensure adequate renal perfusion -continue free water flushes: increased today -8/7: albumin + metolazone + lasix  Sacral Decubitus  -appreciate WOC assistance    Deconditioning of Critical Illness  -PT / OT efforts  Small left adrenal mass seen on CT scan of the abdomen August 24, 2019 - OP f/u  Daily Goals Checklist  Pain/Anxiety/Delirium protocol (if indicated): see above  VAP protocol (if indicated): bundle in place.  DVT prophylaxis: eliquis Nutrition Status: TF  GI prophylaxis: PPI Urinary catheter: n/a Central lines: removed Glucose control: See above Mobility/therapy needs: PT for ROM  Code Status: Full  Family Communication: per  primary Disposition: ICU -- likely will need LTACH    The patient is critically ill with multiple organ systems failure and requires high complexity decision making for assessment and support, frequent evaluation and titration of therapies, application of advanced monitoring technologies and extensive interpretation of multiple databases. Critical Care Time devoted to patient care services described in this note independent of APP/resident time (if applicable)  is 35 minutes.   Erskine Emery MD [facility] 09/15/2019 10:03 AM Personal pager: [phone] If unanswered, please page CCM On-call: [phone]

## 2019-09-15 NOTE — Plan of Care (Signed)
?  Problem: Clinical Measurements: ?Goal: Diagnostic test results will improve ?Outcome: Progressing ?  ?Problem: Nutrition: ?Goal: Adequate nutrition will be maintained ?Outcome: Progressing ?  ?Problem: Elimination: ?Goal: Will not experience complications related to bowel motility ?Outcome: Progressing ?Goal: Will not experience complications related to urinary retention ?Outcome: Progressing ?  ?Problem: Pain Managment: ?Goal: General experience of comfort will improve ?Outcome: Progressing ?  ?

## 2019-09-15 NOTE — Progress Notes (Signed)
° °   °  [address]       [place] [address]             [phone]      Failed attempted TC trial this AM Not surprising given worsened CXR  Will check a sputum culture Continue diflucan Will hold off on restarting additional antibiotics unless for now unless condition worsens  Viviann Spare C. Dorris Fetch, MD [facility] [phone]

## 2019-09-15 NOTE — Progress Notes (Signed)
Physical Therapy Wound Treatment Patient Details  Name: Todd [name] MRN: [id number] Date of Birth: 1956/01/26  Today's Date: 09/15/2019 Time:  -     Subjective  Subjective: Mouthed "not good" when asked how he was doing today. Shakes head "yes" when asked if he was in pain.  Patient and Family Stated Goals: unable Date of Onset:  (unknown) Prior Treatments: foam dressing  Pain Score: Premedicated with IV meds but still visibly painful. Faces pain scale 8/10 at times.   Wound Assessment  Pressure Injury 08/09/19 Sacrum Right Unstageable - Full thickness tissue loss in which the base of the injury is covered by slough (yellow, tan, gray, green or brown) and/or eschar (tan, brown or black) in the wound bed. has evolved into unstageable whe (Active)  Dressing Type ABD;Barrier Film (skin prep);Gauze (Comment);Moist to dry 09/15/19 0900  Dressing Clean;Dry;Intact 09/15/19 0900  Dressing Change Frequency Daily 09/15/19 0900  State of Healing Non-healing 09/15/19 0900  Site / Wound Assessment Bleeding;Yellow;Pink 09/15/19 0900  % Wound base Red or Granulating 70% 09/15/19 0900  % Wound base Yellow/Fibrinous Exudate 30% 09/15/19 0900  % Wound base Black/Eschar 0% 09/15/19 0900  % Wound base Other/Granulation Tissue (Comment) 0% 09/15/19 0900  Peri-wound Assessment Intact;Bleeding;Pink 09/15/19 0900  Wound Length (cm) 8 cm 09/15/19 0900  Wound Width (cm) 5 cm 09/15/19 0900  Wound Depth (cm) 2.5 cm 09/15/19 0900  Wound Surface Area (cm^2) 40 cm^2 09/15/19 0900  Wound Volume (cm^3) 100 cm^3 09/15/19 0900  Margins Unattached edges (unapproximated) 09/15/19 0900  Drainage Amount Moderate 09/15/19 0900  Drainage Description Serosanguineous 09/15/19 0900  Treatment Debridement (Selective);Hydrotherapy (Pulse lavage);Packing (Saline gauze) 09/15/19 0900   Santyl applied to wound bed prior to applying dressing.     Hydrotherapy Pulsed lavage therapy - wound location: sacrum Pulsed Lavage with  Suction (psi): 12 psi Pulsed Lavage with Suction - Normal Saline Used: 1000 mL Pulsed Lavage Tip: Tip with splash shield Selective Debridement Selective Debridement - Location: sacrum Selective Debridement - Tools Used: Forceps;Scalpel Selective Debridement - Tissue Removed: yellow/gray slough   Wound Assessment and Plan  Wound Therapy - Assess/Plan/Recommendations Wound Therapy - Clinical Statement: Pt very painful this session and debridement was limited because of this. Vaseline gauze placed on bleeding granulated tissue around periwound for added protection. This patient will benefit from continued hydrotherapy for selective removal of unviable tissue, to decrease bioburden, and promote wound bed healing.  Wound Therapy - Functional Problem List: limited positioning options due to pressure wound Factors Delaying/Impairing Wound Healing: Diabetes Mellitus;Incontinence;Immobility;Multiple medical problems Hydrotherapy Plan: Debridement;Dressing change;Patient/family education;Pulsatile lavage with suction Wound Therapy - Frequency: 6X / week Wound Therapy - Follow Up Recommendations: Other (comment) (has been denied, but remains appropriate) Wound Plan: see above  Wound Therapy Goals- Improve the function of patient's integumentary system by progressing the wound(s) through the phases of wound healing (inflammation - proliferation - remodeling) by: Decrease Necrotic Tissue to: 50% Decrease Necrotic Tissue - Progress: Progressing toward goal Increase Granulation Tissue to: 50 Increase Granulation Tissue - Progress: Progressing toward goal  Goals will be updated until maximal potential achieved or discharge criteria met.  Discharge criteria: when goals achieved, discharge from hospital, MD decision/surgical intervention, no progress towards goals, refusal/missing three consecutive treatments without notification or medical reason.  GP     Thelma Comp 09/15/2019, 11:39 AM   Rolinda Roan, PT, DPT Acute Rehabilitation Services Pager: [phone] Office: [phone]

## 2019-09-15 NOTE — Progress Notes (Signed)
This note also relates to the following rows which could not be included: SpO2 - Cannot attach notes to unvalidated device data  RT NOTE: RT placed patient on 60% ATC. Patient immediately desated into the 70's, patient's color changed and became unresponsive. RT bagged patient and sats immediately came up to 95% and patient responded. Patient placed back on ventilator on full support. RN and MD aware. Vitals are stable. RT will continue to monitor.

## 2019-09-15 NOTE — Progress Notes (Signed)
29 Days Post-Op Procedure(s) (LRB): REMOVAL OF IMPELLA LEFT VENTRICULAR ASSIST DEVICE (N/A) TRANSESOPHAGEAL ECHOCARDIOGRAM (TEE) (N/A) Subjective: Alert, denies pain, shortness of breath, "I want to go home"  Objective: Vital signs in last 24 hours: Temp:  [98 F (36.7 C)-99.3 F (37.4 C)] 98 F (36.7 C) (08/07 0755) Pulse Rate:  [72-99] 76 (08/07 0738) Cardiac Rhythm: Normal sinus rhythm (08/07 0400) Resp:  [17-30] 17 (08/07 0738) BP: (74-147)/(37-78) 122/52 (08/07 0738) SpO2:  [95 %-100 %] 100 % (08/07 0755) FiO2 (%):  [50 %-60 %] 50 % (08/07 0755) Weight:  [73.5 kg] 73.5 kg (08/07 0500)  Hemodynamic parameters for last 24 hours:    Intake/Output from previous day: 08/06 0701 - 08/07 0700 In: 2208.6 [I.V.:228.6; NG/GT:1680; IV Piggyback:200] Out: 1465 [Urine:1050; Drains:415] Intake/Output this shift: No intake/output data recorded.  General appearance: alert, cooperative and no distress Neurologic: intact Heart: regular rate and rhythm Lungs: rhonchi R>L Abdomen: normal findings: soft, non-tender  Lab Results: Recent Labs    09/14/19 0555 09/15/19 0059  WBC 13.4* 13.6*  HGB 8.3* 8.1*  HCT 28.9* 27.8*  PLT 224 228   BMET:  Recent Labs    09/14/19 0555 09/15/19 0059  NA 148* 148*  K 3.8 3.8  CL 110 108  CO2 27 30  GLUCOSE 135* 187*  BUN 85* 86*  CREATININE 0.97 1.03  CALCIUM 8.3* 8.4*    PT/INR: No results for input(s): LABPROT, INR in the last 72 hours. ABG    Component Value Date/Time   PHART 7.416 08/29/2019 1440   HCO3 25.7 08/29/2019 1440   TCO2 27 08/29/2019 1440   ACIDBASEDEF 1.0 08/08/2019 1115   O2SAT 67.1 09/02/2019 0234   CBG (last 3)  Recent Labs    09/14/19 2327 09/15/19 0324 09/15/19 0733  GLUCAP 194* 152* 224*    Assessment/Plan: S/P Procedure(s) (LRB): REMOVAL OF IMPELLA LEFT VENTRICULAR ASSIST DEVICE (N/A) TRANSESOPHAGEAL ECHOCARDIOGRAM (TEE) (N/A) -  CV- stable Neuro- stable RESP- VDRF, on 50% FiO2, CXR shows  increased air space disease on Right  Vent, TC trials per CCM RENAL- creatinine stable, BUN still elevated, lasix decreased yesterday ENDO- CBG elevated ID- on Diflucan, WBC elevated but not significantly changed, afebrile Anemia- stable Protein calorie malnutrition- on TF at goal Jaundice- T bili up slightly, still has perc chole in place   LOS: 51 days    [name] 09/15/2019

## 2019-09-16 ENCOUNTER — Inpatient Hospital Stay (HOSPITAL_COMMUNITY): Payer: Medicare Other

## 2019-09-16 ENCOUNTER — Inpatient Hospital Stay: Payer: Self-pay

## 2019-09-16 LAB — GLUCOSE, CAPILLARY
Glucose-Capillary: 147 mg/dL — ABNORMAL HIGH (ref 70–99)
Glucose-Capillary: 187 mg/dL — ABNORMAL HIGH (ref 70–99)
Glucose-Capillary: 202 mg/dL — ABNORMAL HIGH (ref 70–99)
Glucose-Capillary: 155 mg/dL — ABNORMAL HIGH (ref 70–99)
Glucose-Capillary: 172 mg/dL — ABNORMAL HIGH (ref 70–99)

## 2019-09-16 LAB — POCT I-STAT 7, (LYTES, BLD GAS, ICA,H+H)
Acid-Base Excess: 5 mmol/L — ABNORMAL HIGH (ref 0.0–2.0)
Bicarbonate: 32 mmol/L — ABNORMAL HIGH (ref 20.0–28.0)
Calcium, Ion: 1.15 mmol/L (ref 1.15–1.40)
HCT: 24 % — ABNORMAL LOW (ref 39.0–52.0)
Hemoglobin: 8.2 g/dL — ABNORMAL LOW (ref 13.0–17.0)
O2 Saturation: 91 %
Potassium: 5 mmol/L (ref 3.5–5.1)
Sodium: 140 mmol/L (ref 135–145)
TCO2: 34 mmol/L — ABNORMAL HIGH (ref 22–32)
pCO2 arterial: 58.2 mmHg — ABNORMAL HIGH (ref 32.0–48.0)
pH, Arterial: 7.348 — ABNORMAL LOW (ref 7.350–7.450)
pO2, Arterial: 65 mmHg — ABNORMAL LOW (ref 83.0–108.0)

## 2019-09-16 LAB — CBC
HCT: 24.9 % — ABNORMAL LOW (ref 39.0–52.0)
HCT: 23.6 % — ABNORMAL LOW (ref 39.0–52.0)
Hemoglobin: 7.2 g/dL — ABNORMAL LOW (ref 13.0–17.0)
Hemoglobin: 6.7 g/dL — CL (ref 13.0–17.0)
MCH: 29.5 pg (ref 26.0–34.0)
MCH: 29 pg (ref 26.0–34.0)
MCHC: 28.9 g/dL — ABNORMAL LOW (ref 30.0–36.0)
MCHC: 28.4 g/dL — ABNORMAL LOW (ref 30.0–36.0)
MCV: 102 fL — ABNORMAL HIGH (ref 80.0–100.0)
MCV: 102.2 fL — ABNORMAL HIGH (ref 80.0–100.0)
Platelets: 214 10*3/uL (ref 150–400)
Platelets: 191 10*3/uL (ref 150–400)
RBC: 2.44 MIL/uL — ABNORMAL LOW (ref 4.22–5.81)
RBC: 2.31 MIL/uL — ABNORMAL LOW (ref 4.22–5.81)
RDW: 19.2 % — ABNORMAL HIGH (ref 11.5–15.5)
RDW: 18.5 % — ABNORMAL HIGH (ref 11.5–15.5)
WBC: 11.5 10*3/uL — ABNORMAL HIGH (ref 4.0–10.5)
WBC: 14.5 10*3/uL — ABNORMAL HIGH (ref 4.0–10.5)
nRBC: 0.2 % (ref 0.0–0.2)
nRBC: 0.1 % (ref 0.0–0.2)

## 2019-09-16 LAB — HEPATIC FUNCTION PANEL
ALT: 119 U/L — ABNORMAL HIGH (ref 0–44)
AST: 109 U/L — ABNORMAL HIGH (ref 15–41)
Albumin: 2 g/dL — ABNORMAL LOW (ref 3.5–5.0)
Alkaline Phosphatase: 575 U/L — ABNORMAL HIGH (ref 38–126)
Bilirubin, Direct: 3.7 mg/dL — ABNORMAL HIGH (ref 0.0–0.2)
Indirect Bilirubin: 2.1 mg/dL — ABNORMAL HIGH (ref 0.3–0.9)
Total Bilirubin: 5.8 mg/dL — ABNORMAL HIGH (ref 0.3–1.2)
Total Protein: 5.6 g/dL — ABNORMAL LOW (ref 6.5–8.1)

## 2019-09-16 LAB — BASIC METABOLIC PANEL
Anion gap: 9 (ref 5–15)
BUN: 97 mg/dL — ABNORMAL HIGH (ref 8–23)
CO2: 32 mmol/L (ref 22–32)
Calcium: 8.2 mg/dL — ABNORMAL LOW (ref 8.9–10.3)
Chloride: 103 mmol/L (ref 98–111)
Creatinine, Ser: 1.09 mg/dL (ref 0.61–1.24)
GFR calc Af Amer: >60 mL/min (ref >60)
GFR calc non Af Amer: >60 mL/min (ref >60)
Glucose, Bld: 167 mg/dL — ABNORMAL HIGH (ref 70–99)
Potassium: 3.8 mmol/L (ref 3.5–5.1)
Sodium: 144 mmol/L (ref 135–145)

## 2019-09-16 LAB — BRAIN NATRIURETIC PEPTIDE: B Natriuretic Peptide: 2390.2 pg/mL — ABNORMAL HIGH (ref 0.0–100.0)

## 2019-09-16 MED ORDER — SODIUM CHLORIDE 0.9% FLUSH
10.0000 mL | INTRAVENOUS | Status: DC | PRN
  Administered 2019-09-19 – 2019-10-06 (×4): 10 mL

## 2019-09-16 MED ORDER — POTASSIUM CHLORIDE 20 MEQ/15ML (10%) PO SOLN
40.0000 meq | Freq: Two times a day (BID) | ORAL | Status: AC
  Administered 2019-09-16 (×2): 40 meq
  Filled 2019-09-16 (×2): qty 30

## 2019-09-16 MED ORDER — INSULIN ASPART 100 UNIT/ML ~~LOC~~ SOLN
2.0000 [IU] | SUBCUTANEOUS | Status: DC
  Administered 2019-09-16 – 2019-10-01 (×85): 2 [IU] via SUBCUTANEOUS

## 2019-09-16 MED ORDER — METOLAZONE 5 MG PO TABS: 5.0000 mg | ORAL_TABLET | Freq: Once | ORAL | Status: DC

## 2019-09-16 MED ORDER — SODIUM CHLORIDE 0.9 % IV SOLN
1.0000 g | Freq: Three times a day (TID) | INTRAVENOUS | Status: DC
  Administered 2019-09-16 – 2019-09-18 (×8): 1 g via INTRAVENOUS
  Filled 2019-09-16 (×10): qty 1

## 2019-09-16 MED ORDER — FUROSEMIDE 10 MG/ML IJ SOLN
40.0000 mg | Freq: Two times a day (BID) | INTRAMUSCULAR | Status: DC
  Administered 2019-09-16 – 2019-09-18 (×5): 40 mg via INTRAVENOUS
  Filled 2019-09-16 (×5): qty 4

## 2019-09-16 MED ORDER — METOLAZONE 5 MG PO TABS
5.0000 mg | ORAL_TABLET | Freq: Once | ORAL | Status: AC
  Administered 2019-09-16: 5 mg
  Filled 2019-09-16: qty 1

## 2019-09-16 MED ORDER — MILRINONE LACTATE IN DEXTROSE 20-5 MG/100ML-% IV SOLN
0.2500 ug/kg/min | INTRAVENOUS | Status: DC
  Administered 2019-09-16 – 2019-09-20 (×7): 0.25 ug/kg/min via INTRAVENOUS
  Filled 2019-09-16: qty 100
  Filled 2019-09-16: qty 200
  Filled 2019-09-16 (×4): qty 100

## 2019-09-16 MED ORDER — SODIUM CHLORIDE 0.9% FLUSH
10.0000 mL | Freq: Two times a day (BID) | INTRAVENOUS | Status: DC
  Administered 2019-09-16: 20 mL
  Administered 2019-09-17 – 2019-09-21 (×10): 10 mL
  Administered 2019-09-22: 40 mL
  Administered 2019-09-22 – 2019-09-23 (×2): 10 mL
  Administered 2019-09-23: 40 mL
  Administered 2019-09-24 – 2019-10-09 (×26): 10 mL

## 2019-09-16 NOTE — Progress Notes (Addendum)
Peripherally Inserted Central Catheter Placement  The IV Nurse has discussed with the patient and/or persons authorized to consent for the patient, the purpose of this procedure and the potential benefits and risks involved with this procedure.  The benefits include less needle sticks, lab draws from the catheter, and the patient may be discharged home with the catheter. Risks include, but not limited to, infection, bleeding, blood clot (thrombus formation), and puncture of an artery; nerve damage and irregular heartbeat and possibility to perform a PICC exchange if needed/ordered by physician.  Alternatives to this procedure were also discussed.  Bard Power PICC patient education guide, fact sheet on infection prevention and patient information card has been provided to patient /or left at bedside.  Consent on chart from previous insertion. Multiple attempts to contact wife unsuccessful.  PICC Placement Documentation  PICC Double Lumen 09/16/19 PICC Right Brachial 37 cm 2 cm (Active)  Indication for Insertion or Continuance of Line Prolonged intravenous therapies 09/16/19 1200  Exposed Catheter (cm) 2 cm 09/16/19 1200  Site Assessment Clean;Dry;Intact 09/16/19 1200  Lumen #1 Status Flushed;Saline locked;Blood return noted 09/16/19 1200  Lumen #2 Status Flushed;Saline locked;Blood return noted 09/16/19 1200  Dressing Type Transparent;Securing device 09/16/19 1200  Dressing Status Clean;Dry;Intact;Antimicrobial disc in place 09/16/19 1200  Safety Lock Not Applicable 09/16/19 1200  Line Care Connections checked and tightened 09/16/19 1200  Line Adjustment (NICU/IV Team Only) No 09/16/19 1200  Dressing Change Due 09/23/19 09/16/19 1200       Elliot Dally 09/16/2019, 1:12 PM

## 2019-09-16 NOTE — Plan of Care (Signed)
  Problem: Nutrition: Goal: Adequate nutrition will be maintained Outcome: Progressing   Problem: Pain Managment: Goal: General experience of comfort will improve Outcome: Progressing   Problem: Clinical Measurements: Goal: Diagnostic test results will improve Outcome: Not Progressing Goal: Respiratory complications will improve Outcome: Not Progressing

## 2019-09-16 NOTE — Progress Notes (Signed)
      [address]       [place] [address]             [phone]      BP (!) 119/53   Pulse 78   Temp 98.2 F (36.8 C) (Oral)   Resp (!) 21   Ht 5\' 9"  (1.753 m)   Wt 75.2 kg   SpO2 100%   BMI 24.48 kg/m  Increased WOB breathing this afternoon- FiO2 increased to 70% PEEP to 12   Intake/Output Summary (Last 24 hours) at 09/16/2019 1922 Last data filed at 09/16/2019 1900 Gross per 24 hour  Intake 5493.03 ml  Output 1370 ml  Net 4123.03 ml   On milrinone at 0.25 Meropenem started earlier today  Alaynah Schutter C. 11/16/2019, MD [facility] [phone]

## 2019-09-16 NOTE — Progress Notes (Signed)
30 Days Post-Op Procedure(s) (LRB): REMOVAL OF IMPELLA LEFT VENTRICULAR ASSIST DEVICE (N/A) TRANSESOPHAGEAL ECHOCARDIOGRAM (TEE) (N/A) Subjective: On vent, responsive  Objective: Vital signs in last 24 hours: Temp:  [98.2 F (36.8 C)-100 F (37.8 C)] 99.3 F (37.4 C) (08/08 0721) Pulse Rate:  [66-87] 72 (08/08 0726) Cardiac Rhythm: Normal sinus rhythm (08/08 0400) Resp:  [16-26] 25 (08/08 0726) BP: (88-142)/(44-95) 105/55 (08/08 0726) SpO2:  [92 %-100 %] 97 % (08/08 0732) FiO2 (%):  [40 %-50 %] 50 % (08/08 0732) Weight:  [75.2 kg] 75.2 kg (08/08 0500)  Hemodynamic parameters for last 24 hours:    Intake/Output from previous day: 08/07 0701 - 08/08 0700 In: 4759.5 [I.V.:209.1; NG/GT:4010; IV Piggyback:340.4] Out: 2620 [Urine:1775; Drains:245; Stool:600] Intake/Output this shift: No intake/output data recorded.  General appearance: cooperative and no distress Neurologic: intact Heart: regular rate and rhythm Lungs: clear to auscultation bilaterally Abdomen: normal findings: soft, non-tender Extremities: edema anasarca Wound: intact  Lab Results: Recent Labs    09/15/19 0059 09/16/19 0220  WBC 13.6* 11.5*  HGB 8.1* 7.2*  HCT 27.8* 24.9*  PLT 228 214   BMET:  Recent Labs    09/15/19 0059 09/16/19 0220  NA 148* 144  K 3.8 3.8  CL 108 103  CO2 30 32  GLUCOSE 187* 167*  BUN 86* 97*  CREATININE 1.03 1.09  CALCIUM 8.4* 8.2*    PT/INR: No results for input(s): LABPROT, INR in the last 72 hours. ABG    Component Value Date/Time   PHART 7.416 08/29/2019 1440   HCO3 25.7 08/29/2019 1440   TCO2 27 08/29/2019 1440   ACIDBASEDEF 1.0 08/08/2019 1115   O2SAT 67.1 09/02/2019 0234   CBG (last 3)  Recent Labs    09/15/19 2329 09/16/19 0327 09/16/19 0719  GLUCAP 172* 147* 187*    Assessment/Plan: S/P Procedure(s) (LRB): REMOVAL OF IMPELLA LEFT VENTRICULAR ASSIST DEVICE (N/A) TRANSESOPHAGEAL ECHOCARDIOGRAM (TEE) (N/A) -  CV- in SR, on Eliquis  BNP 2390  c/w decompensated left heart failure, pulmonary edema on CXR  Agree with restarting milrinone, IV lasix RESP_ unable to make progress with vent wean, likely combination of pulmonary edema and pneumonia. Diurese, restart meropenem ID- low grade temp, worsening pulmonary secretions and likely AS disease on CXR, agree with restarting meropenem, Diflucan through 8/10 RENAL- creatinine and lytes Ok, Elevated BUN likely prerenal due to low CO GI- tolerating TF, agree with trial of clamping per chole ENDO- CBG moderately elevated    LOS: 52 days    [name] 09/16/2019

## 2019-09-16 NOTE — Progress Notes (Signed)
Pharmacy Antibiotic Note  Todd [name] is a 64 y.o. male admitted on 07/26/2019 with CHF exacerbation. 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 VT arrest on 6/25 and underwent VA ECMO. Decannulated on 6/28 and Impella placed. Chest closed on 7/2 and Imella removed 7/9. Cholecystostomy tube placed on 7/16.  Patient has been on/off abx during his stay.  Pharmacy had been consulted for fluconazole for candidemia. Patient now with GNR in TA and will add back meropenem  Low grade temp 100, wbc fluctuated 11-13, renal function stable Cr 1, stop date in place for fluconazole 8/10.   Plan: -Begin meropenem 1gm IV q8h -Continue fluconazole 465m q24h through 8/10 -Stop dates entered by ID team  Height: _0  (175.3 cm) Weight: 75.2 kg (165 lb 12.6 oz) IBW/kg (Calculated) : 70.7  Temp (24hrs), Avg:99 F (37.2 C), Min:98.2 F (36.8 C), Max:100 F (37.8 C)  Recent Labs  Lab 09/12/19 0304 09/13/19 0211 09/14/19 0555 09/15/19 0059 09/16/19 0220  WBC 11.8* 12.9* 13.4* 13.6* 11.5*  CREATININE 0.92 0.95 0.97 1.03 1.09    Estimated Creatinine Clearance: 67.6 mL/min (by C-G formula based on SCr of 1.09 mg/dL).    Allergies  Allergen Reactions  . Empagliflozin     Other reaction(s): diarrhea  . 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 Other     Other reaction(s): Unknown  . Simvastatin     Other reaction(s): diarrhea  . Sitagliptin     Other reaction(s): diarrhea/abd pain    Antimicrobials this admission: Zosyn 7/14>> 7/21 per ID Fluconazole 7/17>> (8/10) Cefuroxime 6/23>>6/25 Vancomycin 6/24>6/24;  6/26>>7/2, 7/5>>7/13 7/22>>(8/5) Meropenem 6/26>>7/4, 7/5>>7/14, 7/22>>7/26 [name] 7/7>>7/14  Dose adjustments thisdosing regimen: 6/30 VR 33, 7/1 22 (ke 0.026, T1/2 26.6) - change to vancomycin 1750 mg q36 7/7 VT = 19 (drawn prior to 3rd dose) - reduce to 1g IV every 24 hours 7/11 VT 21 - reduce to 750 q 24, retime for later tonight>start  7/12 AM 7/26 VT 22 (drawn 3h late) - reduce to 750q24h  Microbiology results: 7/3+7/5 TA - few yeast> few candida topicalis  7/5 + 7/7 Bcx: neg 7/7 Ucx: neg 7/9 Wound Cx: neg 7/12 BAL: candida albicans, candida tropicalis 7/14 pleural fluid: rare candida parapsilosis 7/14 Blood>>>candida parapsilosis  7/16 abscess: ngF 7/17 BCx: ng 7/21 Blood: ng 7/22 Bcx: ng 7/22 TA: no orgs seen >> reincubating 8/7 TA GNR  LBonnita NasutiPharm.D. CPP, BCPS Clinical Pharmacist [phone]/09/2019 11:32 AM     Please check [email or web address] for unit-specific pharmacy phone numbers.

## 2019-09-16 NOTE — Progress Notes (Signed)
[facility] Physician-Brief Progress Note Patient Name: Todd [name] DOB: 1955-07-03 MRN: [id number]   Date of Service  09/16/2019  HPI/Events of Note  S/pCABG. Prolonged illness. Trach on Vent. Anemia. Hg hovering over 7 to 8.5, last one 6.7 No bleeding. Plt 190K.   eICU Interventions  - repeat Hg/Hct, if < 7 persisting to transfuse. Has CHF. Risk for volume overload. Watch for melena, hematemesis.      Intervention Category Intermediate Interventions: Other:  Ranee Gosselin 09/16/2019, 8:43 PM

## 2019-09-16 NOTE — Progress Notes (Signed)
NAME:  Todd [name], MRN:  [id number], DOB:  July 18, 1955, LOS: 27 ADMISSION DATE:  07/26/2019, CONSULTATION DATE:  08/03/19 REFERRING MD:  Roxan Hockey, CHIEF COMPLAINT:  ECMO   Brief History   64 yo male admitted with CHF exacerbation, found to have multi-vessel disease requiring CABG 6/24, post op VT Arrest 6/25 and cannulated for VA ECMO.    History of present illness   Presented with worsening dyspnea 6/17 c/w CHF exacerbation.  LHC  with severe triple vessel disease.  Underwent CABG 6/24. Vtach arrest 6/25.  Chest opened bedside and cardiac massage initiated as well as multiple cardioversions, amiodarone, bicarb etc.  Brought to OR and cannulated for VA ECMO.  PCCM consulted to assist with management  Comorbidities include DM, heavy smoking, COPD  Past Medical History  Depression Ischemic cardiomyopathy HTN HLD  Significant Hospital Events   6/24 CABG 6/25 VA cannulation 6/28 decannulated and impella placed 6/29 bedside re-exploration; s/p decannulation. Placement of impella device originally at p8 but decreased to p4 2/2 suction events overnight up to p6. Echo completed and repositioned device. Remained on considerable support with 8 epi and 46 norepi vaso 0.05. continued chest tube output with large clots noted. Heparin thru device. Replacement products ongoing. 60% 8 peep 6/30 bedside mediastinal re-exploration and clean out of hematoma with tamponade and worsening hemodynamics. Pt had large volume transfusion. rebolused with amio 2/2 nsvt episode.  Weight up 48 pounds.  Started diuresis, Lasix drip started. 7/01 iatrogenic respiratory alkalosis, vent rate decreased. 7/02 No significant issues overnight remains on pressors and Impella not tolerating tube feeds with high gastric output awaiting core track placement 7/03 started trickle TF  7/05 Swab removed, CVL and PICC placed. 7/13 Trach and BAL 7/16 Awake and tracking. No follow commands. Still having fevers. Successful  ultrasound and CT guided placement of a 10.2 French cholecystostomy tube. A small amount of aspirated bile was capped and sent to the laboratory for analysis 7/17 Off sedation, PSV.  Beginning to follow some commands occasionally intermittently.  Very jaundiced.  Growing Candida in the blood and BAL > diflucan.   7/21 Ongoing fevers, WBC increased 7/22 Persistent fevers, pan cultured with CT Chest/Head. Suspected source tracheobronchitis.  7/28 Transitioned to eliquis, jaundiced but labs improving  7/29 Agitation/restless.  Trazodone added QHS. Weaning midodrine, attempting diuresis  7/30 Increased Na, weaning on PSV.  8/01 On 40% ATC, significantly improved mental status 8/1>> ongoing attempts at trach weaning complicated by weakness, secretion management, and anxiety  Consults:  Advanced heart failure, PCCM, TCTS  Procedures:  LUE PICC 7/5 >>7/21 R Brachial ALine 7/9 >>7/27 Chest Tubes >> L IJ CVC 7/21 >>7/27 Peg tube 7/27 >>  Significant Diagnostic Tests:  6/22 spirometry with restrictive physiology, preserved FEV1/FVC 6/18 echo: LVEF 94-07%, grade I diastolic dysfunction 6/80 US Abdomen cholelithiasis without cholecystitis. Mild wall thinking in setting of liver disease and ascites.  7/10 LE [place] negative for DVT  7/20 CT ABD w contrast >> stable position of the percutaneous cholecystostomy tube with complete decompression of the gallbladder, trace residual free fluid within the RUQ, decreased since prior, trace bilateral pleural effusions L>R, scattered areas of ground glass airspace disease within the RML, RLL 7/22 CT Chest w/o Contrast >> Retrosternal fluid collection, non specific.  Bilateral pleural effusions, portions loculated.   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 Stable bilateral mastoid and middle ear effusions.   Micro Data:  See summary- micro tab in  EPIC  Antimicrobials:  See summary-  fever tab in EPIC  Interim history/subjective:  Slept well per nursing. Still feels warm, needs fans during day. Temps borderline. Failed tc trial pretty quick 8/7  Objective   Blood pressure (!) 105/55, pulse 71, temperature 100 F (37.8 C), temperature source Oral, resp. rate 16, height _0  (1.753 m), weight 75.2 kg, SpO2 94 %.    Vent Mode: PRVC FiO2 (%):  [40 %-50 %] 50 % Set Rate:  [14 bmp] 14 bmp Vt Set:  [420 mL] 420 mL PEEP:  [5 cmH20] 5 cmH20 Pressure Support:  [10 cmH20] 10 cmH20 Plateau Pressure:  [15 cmH20-28 cmH20] 28 cmH20   Intake/Output Summary (Last 24 hours) at 09/16/2019 0708 Last data filed at 09/16/2019 0600 Gross per 24 hour  Intake 4759.46 ml  Output 2620 ml  Net 2139.46 ml   Filed Weights   09/14/19 0500 09/15/19 0500 09/16/19 0500  Weight: 72 kg 73.5 kg 75.2 kg   Exam GEN: jaundiced frail elderly man on vent HEENT: MM dry, tracheostomy CDI  CV: RRR, ext warm PULM: Scattered rhonci, triggering vent GI: Soft, +BS EXT: Muscle wasting,  ischemic changes on right finger, scattered bruising NEURO: moves all 4 ext to command, profoundly weak PSYCH: RASS -1 SKIN: Jandiced, bruising as above  245cc out GB drain Sodium 139>>143>>148>>148>>144 BUN/Cr stable Hgb stable No fevers Net +2L CXR looks more like volume  Lab Results  Component Value Date   WBC 11.5 (H) 09/16/2019   HGB 7.2 (L) 09/16/2019   HCT 24.9 (L) 09/16/2019   MCV 102.0 (H) 09/16/2019   PLT 214 09/16/2019   Lab Results  Component Value Date   CREATININE 1.09 09/16/2019   BUN 97 (H) 09/16/2019   NA 144 09/16/2019   K 3.8 09/16/2019   CL 103 09/16/2019   CO2 32 09/16/2019   Resolved Hospital Problem list   Suspected HCAP Acute kidney injury, improved  Hypernatremia, improved  Acute Metabolic Encephalopathy  -CTH without acute intracranial pathology. EEG negative 7/23.  Shock Heparin-induced thrombocytopenia >> resolved, required bivalirudin  Assessment & Plan:    Anxiety -likely ICU related stress disorder - Prozac daily, seroquel qHS, klonipin  Acute hypoxic respiratory failure w/ need for mechanical ventilation - status post tracheostomy- Poor lung mechanics due to weakness, deconditioning  8/8: worsening pulmonary edema pattern on CXR but also r/o HCAP, trach aspirate GNR and abundant GNR - Continue PS +/- TC trials as able - Work with PT - Will likely need LTACH - Continue xopenex - CPT, pulm hygiene - consider restarting meropenem: d/w primary  Cardiogenic/hemorrhagic shock status post CABG complicated by VT arrest, improved S/p VA ECMO (decanulated 6/28). S/p impella placement (6/28> 7/9) -continue midodrine -Continue diuresis: switch lasix to IV, add dose of metolazone, with drop in SvO2, maybe try some inotropes?  Will d/w primary -follow I/O's  -post operative care per TCTS   Atrial flutter -continue amiodarone, ASA, Eliquis  -tele monitoring    Hyperbilirubinemia due to acalculous cholecystitis Possible cirrhosis per 7/8 [place]  Status post  percutaneous GB drain  Jaundice- improving -drain care per IR  -Trend tbili, consider removal of drain when normalized   DM II with fluctuant hypo/hyperglycemia -monitor glucose  -continue TF  -BID levemir + rSSI   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  -ID assistance appreciated  -Diflucan through 8/10  Acute blood loss anemia and critical illness anemia HIT, resolved  P -Continue eliquis  -follow CBC closely -  8/5: transfuse 1 unit  Acute kidney injury, improved  Hypernatremia, improved  -Trend BMP / urinary output -Replace electrolytes as indicated -Avoid nephrotoxic agents, ensure adequate renal perfusion - free water flushes  Sacral Decubitus  -appreciate WOC assistance    Deconditioning of Critical Illness  -PT / OT efforts  Small left adrenal mass seen on CT scan of the abdomen August 24, 2019 - OP f/u  Daily  Goals Checklist  Pain/Anxiety/Delirium protocol (if indicated): see above  VAP protocol (if indicated): bundle in place.  DVT prophylaxis: eliquis Nutrition Status: TF  GI prophylaxis: PPI Urinary catheter: n/a Central lines: removed Glucose control: See above Mobility/therapy needs: PT for ROM  Code Status: Full  Family Communication: per primary Disposition: ICU -- likely will need LTACH  The patient is critically ill with multiple organ systems failure and requires high complexity decision making for assessment and support, frequent evaluation and titration of therapies, application of advanced monitoring technologies and extensive interpretation of multiple databases. Critical Care Time devoted to patient care services described in this note independent of APP/resident time (if applicable)  is 33 minutes.   Erskine Emery MD [facility] 09/16/2019 7:08 AM Personal pager: [phone] If unanswered, please page CCM On-call: [phone]

## 2019-09-17 ENCOUNTER — Inpatient Hospital Stay (HOSPITAL_COMMUNITY): Payer: Medicare Other

## 2019-09-17 LAB — PHOSPHORUS: Phosphorus: 4.1 mg/dL (ref 2.5–4.6)

## 2019-09-17 LAB — GLUCOSE, CAPILLARY
Glucose-Capillary: 113 mg/dL — ABNORMAL HIGH (ref 70–99)
Glucose-Capillary: 116 mg/dL — ABNORMAL HIGH (ref 70–99)
Glucose-Capillary: 125 mg/dL — ABNORMAL HIGH (ref 70–99)
Glucose-Capillary: 131 mg/dL — ABNORMAL HIGH (ref 70–99)
Glucose-Capillary: 140 mg/dL — ABNORMAL HIGH (ref 70–99)
Glucose-Capillary: 195 mg/dL — ABNORMAL HIGH (ref 70–99)
Glucose-Capillary: 200 mg/dL — ABNORMAL HIGH (ref 70–99)

## 2019-09-17 LAB — COMPREHENSIVE METABOLIC PANEL
ALT: 119 U/L — ABNORMAL HIGH (ref 0–44)
AST: 112 U/L — ABNORMAL HIGH (ref 15–41)
Albumin: 1.9 g/dL — ABNORMAL LOW (ref 3.5–5.0)
Alkaline Phosphatase: 519 U/L — ABNORMAL HIGH (ref 38–126)
Anion gap: 8 (ref 5–15)
BUN: 114 mg/dL — ABNORMAL HIGH (ref 8–23)
CO2: 29 mmol/L (ref 22–32)
Calcium: 7.9 mg/dL — ABNORMAL LOW (ref 8.9–10.3)
Chloride: 99 mmol/L (ref 98–111)
Creatinine, Ser: 1.26 mg/dL — ABNORMAL HIGH (ref 0.61–1.24)
GFR calc Af Amer: >60 mL/min (ref >60)
GFR calc non Af Amer: 59 mL/min — ABNORMAL LOW (ref >60)
Glucose, Bld: 133 mg/dL — ABNORMAL HIGH (ref 70–99)
Potassium: 4.7 mmol/L (ref 3.5–5.1)
Sodium: 136 mmol/L (ref 135–145)
Total Bilirubin: 6.6 mg/dL — ABNORMAL HIGH (ref 0.3–1.2)
Total Protein: 5.4 g/dL — ABNORMAL LOW (ref 6.5–8.1)

## 2019-09-17 LAB — CBC
HCT: 22.5 % — ABNORMAL LOW (ref 39.0–52.0)
Hemoglobin: 6.7 g/dL — CL (ref 13.0–17.0)
MCH: 30.3 pg (ref 26.0–34.0)
MCHC: 29.8 g/dL — ABNORMAL LOW (ref 30.0–36.0)
MCV: 101.8 fL — ABNORMAL HIGH (ref 80.0–100.0)
Platelets: 186 10*3/uL (ref 150–400)
RBC: 2.21 MIL/uL — ABNORMAL LOW (ref 4.22–5.81)
RDW: 18.3 % — ABNORMAL HIGH (ref 11.5–15.5)
WBC: 13.1 10*3/uL — ABNORMAL HIGH (ref 4.0–10.5)
nRBC: 0.3 % — ABNORMAL HIGH (ref 0.0–0.2)

## 2019-09-17 LAB — PREPARE RBC (CROSSMATCH)

## 2019-09-17 LAB — COOXEMETRY PANEL
Carboxyhemoglobin: 2.3 % — ABNORMAL HIGH (ref 0.5–1.5)
Methemoglobin: 1.3 % (ref 0.0–1.5)
O2 Saturation: 64 %
Total hemoglobin: 6.3 g/dL — CL (ref 12.0–16.0)

## 2019-09-17 LAB — HEMOGLOBIN AND HEMATOCRIT, BLOOD
HCT: 24.9 % — ABNORMAL LOW (ref 39.0–52.0)
Hemoglobin: 7.6 g/dL — ABNORMAL LOW (ref 13.0–17.0)

## 2019-09-17 LAB — MAGNESIUM: Magnesium: 2.6 mg/dL — ABNORMAL HIGH (ref 1.7–2.4)

## 2019-09-17 MED ORDER — SILVER NITRATE-POT NITRATE 75-25 % EX MISC
1.0000 | CUTANEOUS | Status: DC | PRN
  Filled 2019-09-17 (×2): qty 1

## 2019-09-17 MED ORDER — SODIUM CHLORIDE 0.9% IV SOLUTION: Freq: Once | INTRAVENOUS | Status: AC

## 2019-09-17 MED ORDER — FREE WATER
200.0000 mL | Status: DC
  Administered 2019-09-17 – 2019-09-20 (×18): 200 mL

## 2019-09-17 MED ORDER — PANTOPRAZOLE SODIUM 40 MG IV SOLR
40.0000 mg | Freq: Two times a day (BID) | INTRAVENOUS | Status: DC
  Administered 2019-09-17 – 2019-10-09 (×45): 40 mg via INTRAVENOUS
  Filled 2019-09-17 (×46): qty 40

## 2019-09-17 NOTE — Progress Notes (Addendum)
NAME:  Todd [name], MRN:  [id number], DOB:  11/16/1955, LOS: 109 ADMISSION DATE:  07/26/2019, CONSULTATION DATE:  08/03/19 REFERRING MD:  Roxan Hockey, CHIEF COMPLAINT:  ECMO   Brief History   64 yo male admitted with CHF exacerbation, found to have multi-vessel disease requiring CABG 6/24, post op VT Arrest 6/25 and cannulated for VA ECMO.    History of present illness   Presented with worsening dyspnea 6/17 c/w CHF exacerbation.  LHC  with severe triple vessel disease.  Underwent CABG 6/24. Vtach arrest 6/25.  Chest opened bedside and cardiac massage initiated as well as multiple cardioversions, amiodarone, bicarb etc.  Brought to OR and cannulated for VA ECMO.  PCCM consulted to assist with management  Comorbidities include DM, heavy smoking, COPD  Past Medical History  Depression Ischemic cardiomyopathy HTN HLD  Significant Hospital Events   6/24 CABG 6/25 VA cannulation 6/28 decannulated and impella placed 6/29 bedside re-exploration; s/p decannulation. Placement of impella device originally at p8 but decreased to p4 2/2 suction events overnight up to p6. Echo completed and repositioned device. Remained on considerable support with 8 epi and 46 norepi vaso 0.05. continued chest tube output with large clots noted. Heparin thru device. Replacement products ongoing. 60% 8 peep 6/30 bedside mediastinal re-exploration and clean out of hematoma with tamponade and worsening hemodynamics. Pt had large volume transfusion. rebolused with amio 2/2 nsvt episode.  Weight up 48 pounds.  Started diuresis, Lasix drip started. 7/01 iatrogenic respiratory alkalosis, vent rate decreased. 7/02 No significant issues overnight remains on pressors and Impella not tolerating tube feeds with high gastric output awaiting core track placement 7/03 started trickle TF  7/05 Swab removed, CVL and PICC placed. 7/13 Trach and BAL 7/16 Awake and tracking. No follow commands. Still having fevers. Successful  ultrasound and CT guided placement of a 10.2 French cholecystostomy tube. A small amount of aspirated bile was capped and sent to the laboratory for analysis 7/17 Off sedation, PSV.  Beginning to follow some commands occasionally intermittently.  Very jaundiced.  Growing Candida in the blood and BAL > diflucan.   7/21 Ongoing fevers, WBC increased 7/22 Persistent fevers, pan cultured with CT Chest/Head. Suspected source tracheobronchitis.  7/28 Transitioned to eliquis, jaundiced but labs improving  7/29 Agitation/restless.  Trazodone added QHS. Weaning midodrine, attempting diuresis  7/30 Increased Na, weaning on PSV.  8/01 On 40% ATC, significantly improved mental status 8/1>> ongoing attempts at trach weaning complicated by weakness, secretion management, and anxiety 8/8 started back on meropenem and milrinone  Consults:  Advanced heart failure, PCCM, TCTS  Procedures:  LUE PICC 7/5 >>7/21 R Brachial ALine 7/9 >>7/27 Chest Tubes >> L IJ CVC 7/21 >>7/27 Peg tube 7/27 >>  Significant Diagnostic Tests:  6/22 spirometry with restrictive physiology, preserved FEV1/FVC 6/18 echo: LVEF 63-14%, grade I diastolic dysfunction 9/70 US Abdomen cholelithiasis without cholecystitis. Mild wall thinking in setting of liver disease and ascites.  7/10 LE [place] negative for DVT  7/20 CT ABD w contrast >> stable position of the percutaneous cholecystostomy tube with complete decompression of the gallbladder, trace residual free fluid within the RUQ, decreased since prior, trace bilateral pleural effusions L>R, scattered areas of ground glass airspace disease within the RML, RLL 7/22 CT Chest w/o Contrast >> Retrosternal fluid collection, non specific.  Bilateral pleural effusions, portions loculated.   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 Stable bilateral mastoid and middle ear effusions.   Micro  Data:  7/14 blood >>  candida parapsilosis resp 8/7 >> GNR >  Antimicrobials:  Meropenem 8/8 >> Diflucan  7/16 >> vanc 7/22 >>   Interim history/subjective:   Afebrile last 24 hours. Remains critically ill, on 60%/PEEP of 12 Gown is soaked with bilious drainage  Objective   Blood pressure (!) 125/53, pulse 76, temperature 98.4 F (36.9 C), resp. rate (!) 21, height _0  (1.753 m), weight 77.3 kg, SpO2 98 %.    Vent Mode: PRVC FiO2 (%):  [50 %-70 %] 60 % Set Rate:  [12 bmp-22 bmp] 12 bmp Vt Set:  [420 mL] 420 mL PEEP:  [5 cmH20-12 cmH20] 12 cmH20 Plateau Pressure:  [15 cmH20-24 cmH20] 19 cmH20   Intake/Output Summary (Last 24 hours) at 09/17/2019 3267 Last data filed at 09/17/2019 0700 Gross per 24 hour  Intake 6345.14 ml  Output 1945 ml  Net 4400.14 ml   Filed Weights   09/15/19 0500 09/16/19 0500 09/17/19 0500  Weight: 73.5 kg 75.2 kg 77.3 kg   Exam GEN: jaundiced frail elderly man on vent HEENT: MM dry, tracheostomy CDI  CV: RRR, ext warm PULM: Scattered rhonci, triggering vent GI: Soft, +BS EXT: Muscle wasting,  ischemic changes on right finger, scattered bruising NEURO: awakemoves all 4 ext to command, profoundly weak PSYCH: RASS -1 SKIN: Jandiced, bruising as above  600 cc from biliary drain. Labs show drop in sodium to 136 , drop in hemoglobin from 7.2-6.7 , slight decrease leukocytosis, stable LFTs Chest x-ray personally reviewed which shows tubes in position, bilateral airspace disease, skinfold over right which he has appearance of pneumothorax  Lab Results  Component Value Date   WBC 13.1 (H) 09/17/2019   HGB 6.7 (LL) 09/17/2019   HCT 22.5 (L) 09/17/2019   MCV 101.8 (H) 09/17/2019   PLT 186 09/17/2019   Lab Results  Component Value Date   CREATININE 1.26 (H) 09/17/2019   BUN 114 (H) 09/17/2019   NA 136 09/17/2019   K 4.7 09/17/2019   CL 99 09/17/2019   CO2 29 09/17/2019   Resolved Hospital Problem list   Suspected HCAP Acute kidney injury, improved   Hypernatremia, improved  Acute Metabolic Encephalopathy  -CTH without acute intracranial pathology. EEG negative 7/23.  Shock Heparin-induced thrombocytopenia >> resolved, required bivalirudin  Assessment & Plan:    Acute hypoxic respiratory failure w/ need for mechanical ventilation - status post tracheostomy- Poor lung mechanics due to weakness, deconditioning  8/8: worsening pulmonary edema pattern on CXR but also r/o HCAP, trach aspirate GNR and abundant GNR - Continue PS +/- TC trials as able - Work with PT - Will likely need LTACH -Added meropenem 8/8, await respiratory culture - Continue xopenex - CPT, pulm hygiene   Cardiogenic/hemorrhagic shock status post CABG complicated by VT arrest, improved S/p VA ECMO (decanulated 6/28). S/p impella placement (6/28> 7/9) -continue midodrine -Continue diuresis -Milrinone added 8/8, current SVO2 acceptable -follow I/O's  -post operative care per TCTS   Atrial flutter -continue amiodarone, ASA, Eliquis  -tele monitoring    Hyperbilirubinemia due to acalculous cholecystitis Possible cirrhosis per 7/8 [place]  Status post  percutaneous GB drain  Jaundice- improving -drain care per IR , additional dressing at the chest tube site -Trend tbili, consider removal of drain when normalized   DM II with fluctuant hypo/hyperglycemia -monitor glucose  -continue TF  -BID levemir 12 q 12h+ rSSI   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  -ID assistance appreciated  -Diflucan through 8/10  Acute blood loss anemia and critical illness anemia , sp 1 U PRBC 8/5 & 8/9  HIT, resolved  P -dc eliquis  -follow CBC closely   Acute kidney injury, improved  Hypernatremia, improved  -Trend BMP / urinary output -Replace electrolytes as indicated -Avoid nephrotoxic agents, ensure adequate renal perfusion - decrease free water 200 q 4h  Anxiety -likely ICU related stress disorder - Prozac daily,  seroquel qHS, klonipin  Sacral Decubitus  -appreciate WOC assistance    Deconditioning of Critical Illness  -PT / OT efforts  Small left adrenal mass seen on CT scan of the abdomen August 24, 2019 - OP f/u  Daily Goals Checklist  Pain/Anxiety/Delirium protocol (if indicated): see above  VAP protocol (if indicated): bundle in place.  DVT prophylaxis: eliquis Nutrition Status: TF  GI prophylaxis: PPI Urinary catheter: n/a Central lines: removed Glucose control: See above Mobility/therapy needs: PT for ROM  Code Status: Full  Family Communication: per primary Disposition: ICU -- likely will need LTACH  The patient is critically ill with multiple organ systems failure and requires high complexity decision making for assessment and support, frequent evaluation and titration of therapies, application of advanced monitoring technologies and extensive interpretation of multiple databases. Critical Care Time devoted to patient care services described in this note independent of APP/resident  time is 31 minutes.    Kara Mead MD. [facility]. [facility]  If no response to pager , please call 319 [phone]   09/17/2019

## 2019-09-17 NOTE — Progress Notes (Signed)
31 Days Post-Op Procedure(s) (LRB): REMOVAL OF IMPELLA LEFT VENTRICULAR ASSIST DEVICE (N/A) TRANSESOPHAGEAL ECHOCARDIOGRAM (TEE) (N/A) Subjective: Not reporting complaint  Objective: Vital signs in last 24 hours: Temp:  [98.4 F (36.9 C)-99.4 F (37.4 C)] 98.8 F (37.1 C) (08/09 1531) Pulse Rate:  [74-84] 79 (08/09 1700) Cardiac Rhythm: Normal sinus rhythm (08/09 1600) Resp:  [16-31] 31 (08/09 1700) BP: (102-130)/(42-101) 130/46 (08/09 1700) SpO2:  [95 %-100 %] 100 % (08/09 1700) FiO2 (%):  [60 %-70 %] 60 % (08/09 1600) Weight:  [77.3 kg] 77.3 kg (08/09 0500)  Hemodynamic parameters for last 24 hours:    Intake/Output from previous day: 08/08 0701 - 08/09 0700 In: 6745.1 [I.V.:315.3; Blood:30; NG/GT:6150; IV Piggyback:199.9] Out: 1945 [Urine:975; Drains:620; Stool:350] Intake/Output this shift: Total I/O In: 1144 [I.V.:129; Blood:315; NG/GT:700] Out: 1170 [Urine:620; Drains:550]  General appearance: no distress Neurologic: intact Heart: regular rate and rhythm, S1, S2 normal, no murmur, click, rub or gallop Lungs: diminished breath sounds bilaterally Abdomen: soft, non-tender; bowel sounds normal; no masses,  no organomegaly Extremities: extremities normal, atraumatic, no cyanosis or edema  Lab Results: Recent Labs    09/16/19 1934 09/16/19 1934 09/17/19 0327 09/17/19 1414  WBC 14.5*  --  13.1*  --   HGB 6.7*   < > 6.7* 7.6*  HCT 23.6*   < > 22.5* 24.9*  PLT 191  --  186  --    < > = values in this interval not displayed.   BMET:  Recent Labs    09/16/19 0220 09/16/19 0220 09/16/19 1714 09/17/19 0327  NA 144   < > 140 136  K 3.8   < > 5.0 4.7  CL 103  --   --  99  CO2 32  --   --  29  GLUCOSE 167*  --   --  133*  BUN 97*  --   --  114*  CREATININE 1.09  --   --  1.26*  CALCIUM 8.2*  --   --  7.9*   < > = values in this interval not displayed.    PT/INR: No results for input(s): LABPROT, INR in the last 72 hours. ABG    Component Value Date/Time    PHART 7.348 (L) 09/16/2019 1714   HCO3 32.0 (H) 09/16/2019 1714   TCO2 34 (H) 09/16/2019 1714   ACIDBASEDEF 1.0 08/08/2019 1115   O2SAT 64.0 09/17/2019 0327   CBG (last 3)  Recent Labs    09/17/19 0721 09/17/19 1112 09/17/19 1528  GLUCAP 116* 125* 113*    Assessment/Plan: S/P Procedure(s) (LRB): REMOVAL OF IMPELLA LEFT VENTRICULAR ASSIST DEVICE (N/A) TRANSESOPHAGEAL ECHOCARDIOGRAM (TEE) (N/A) able to wean the vent a little  Continue to treat PNA empirically. Continue supportive care   LOS: 53 days    [name] 09/17/2019

## 2019-09-17 NOTE — Progress Notes (Signed)
Physical Therapy Wound Treatment Patient Details  Name: Todd [name] MRN: [id number] Date of Birth: 1955/07/11  Today's Date: 09/17/2019 Time: 1312-1356 Time Calculation (min): 44 min  Subjective  Subjective: Pt wife in room initially did not want to see wound but ultimately wanted a better understanding of what was going on and was able to look at wound  Patient and Family Stated Goals: unable Date of Onset:  (unknown) Prior Treatments: foam dressing  Pain Score:  Pt premedicated prior to session. Pt request hold of hydrotherapy half way through 1 L of saline lavage due to pain. Able to continue with brief respite. Pt slept through most of debridement.   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 has evolved into stage 4 when assessed on 8/9 (Active)  Wound Image   09/11/19 1625  Dressing Type ABD;Gauze (Comment);Moist to moist;Barrier Film (skin prep);Non adherent 09/17/19 1600  Dressing Changed;Clean;Dry;Intact 09/17/19 1600  Dressing Change Frequency Daily 09/17/19 1600  State of Healing Non-healing 09/17/19 1600  Site / Wound Assessment Pink;Red;Yellow;Granulation tissue;Bleeding;Painful 09/17/19 1600  % Wound base Red or Granulating 70% 09/17/19 1600  % Wound base Yellow/Fibrinous Exudate 30% 09/17/19 1600  % Wound base Black/Eschar 0% 09/17/19 1600  % Wound base Other/Granulation Tissue (Comment) 0% 09/17/19 1600  Peri-wound Assessment Bleeding;Denuded;Maceration 09/17/19 1600  Wound Length (cm) 8 cm 09/15/19 0900  Wound Width (cm) 5 cm 09/15/19 0900  Wound Depth (cm) 2.5 cm 09/15/19 0900  Wound Surface Area (cm^2) 40 cm^2 09/15/19 0900  Wound Volume (cm^3) 100 cm^3 09/15/19 0900  Margins Unattached edges (unapproximated) 09/17/19 1600  Drainage Amount Minimal 09/17/19 1600  Drainage Description Serosanguineous 09/17/19 1600  Treatment Debridement (Selective);Hydrotherapy (Pulse lavage);Packing  (Saline gauze) 09/17/19 1600   Santyl applied to necrotic tissue.   Drainage Amount Minimal 09/11/19 1625  Drainage Description Serosanguineous 09/11/19 1625  Non-staged Wound Description Partial thickness 09/11/19 1625  Treatment Debridement (Selective);Hydrotherapy (Pulse lavage);Packing (Saline gauze);Tape changed 09/11/19 1625   Santyl applied to necrotic tissue   Hydrotherapy Pulsed lavage therapy - wound location: sacrum Pulsed Lavage with Suction (psi): 12 psi Pulsed Lavage with Suction - Normal Saline Used: 1000 mL Pulsed Lavage Tip: Tip with splash shield Selective Debridement Selective Debridement - Location: sacrum Selective Debridement - Tools Used: Forceps;Scissors Selective Debridement - Tissue Removed: yellow/gray slough   Wound Assessment and Plan  Wound Therapy - Assess/Plan/Recommendations Wound Therapy - Clinical Statement: [place] nurse present during session educating pt's wife on wound treatment and healing, education continued during hydrotherapy session as well. Pt continues to have necrotic tissue especially on walls of wound which continues to be removed with selective debridement. Pt will continut to benefit from hydrotherapy and selective debridement to reduce bioburden to promote wound healing.  Wound Therapy - Functional Problem List: decreased mobility Factors Delaying/Impairing Wound Healing: Diabetes Mellitus;Incontinence;Immobility;Multiple medical problems Hydrotherapy Plan: Debridement;Dressing change;Patient/family education;Pulsatile lavage with suction Wound Therapy - Frequency: 6X / week Wound Therapy - Follow Up Recommendations:  (LTACH) Wound Plan: see above  Wound Therapy Goals- Improve the function of patient's integumentary system by progressing the wound(s) through the phases of wound healing (inflammation - proliferation - remodeling) by: Decrease Necrotic Tissue to: 20 Decrease Necrotic Tissue - Progress: Goal set today Increase Granulation  Tissue to: 80 Increase Granulation Tissue - Progress: Goal set today Time For Goal Achievement: 7 days Wound Therapy - Potential for Goals: Fair  Goals will be updated until maximal potential achieved  or discharge criteria met.  Discharge criteria: when goals achieved, discharge from hospital, MD decision/surgical intervention, no progress towards goals, refusal/missing three consecutive treatments without notification or medical reason.  GP    Dani Gobble. Migdalia Dk PT, DPT Acute Rehabilitation Services Pager [phone] Office [phone]  [place] 09/17/2019, 4:21 PM

## 2019-09-17 NOTE — Progress Notes (Signed)
eLink Physician-Brief Progress Note Patient Name: Todd [name] DOB: 09-Oct-1955 MRN: [id number]   Date of Service  09/17/2019  HPI/Events of Note  Hg < 7. No active bleeding.   eICU Interventions  One PRBC. Follow post H/H     Intervention Category Intermediate Interventions: Other:  Ranee Gosselin 09/17/2019, 4:46 AM

## 2019-09-17 NOTE — Consult Note (Addendum)
WOC Nurse wound follow up Assessed sacrum wound with PT during hydrotherapy; refer to their notes for measurements and percentages.  Wound is slowly improving; will probably only need one more week of hydrotherapy to achieve maximum benefits from this treatment.   Wound type: Stage 4 pressure injury to sacrum; exposed bone with decreasing amount of slough.  Mod amt tan drainage, some odor, red bleeding macerated edges surrounding the wound.  Pt has a Fexiseal in place to attempt to contain stool, but still leaks a mod amt around the opening. After hydrotherapy has been completed, a Vac would not be able to hold a seal related to the close proximity of the rectum to the wound and the loose stools.  Pt has multiple systemic factors which can impair wound healing.  They are on a low air loss mattress to reduce pressure.  Right first finger tip with 100% dry eschar of unknown etiology; 1X1cm, tightly adhered without fluctuance or open wound. Involved area includes underneath the nail bed and bleeds slightly when touched. Location is not consistent with a pressure injury.   Dressing procedure/placement/frequency: Continue present plan of care with Betadine to finger and Santyl and hydrotherapy to sacral wound to assist with removal of nonviable tissue. WOC will re-assess next Mon or Tues and determine if a change in the plan of care should be made at that time.  Cammie Mcgee MSN, RN, CWOCN, [place], CNS [phone]

## 2019-09-17 NOTE — Progress Notes (Signed)
31 Days Post-Op Procedure(s) (LRB): REMOVAL OF IMPELLA LEFT VENTRICULAR ASSIST DEVICE (N/A) TRANSESOPHAGEAL ECHOCARDIOGRAM (TEE) (N/A) Subjective: Alert, denies pain  Objective: Vital signs in last 24 hours: Temp:  [98.2 F (36.8 C)-99.4 F (37.4 C)] 98.4 F (36.9 C) (08/09 0723) Pulse Rate:  [71-84] 76 (08/09 0615) Cardiac Rhythm: Normal sinus rhythm (08/09 0400) Resp:  [20-30] 21 (08/09 0615) BP: (95-128)/(45-71) 125/53 (08/09 0615) SpO2:  [89 %-100 %] 98 % (08/09 0615) FiO2 (%):  [50 %-70 %] 60 % (08/09 0808) Weight:  [77.3 kg] 77.3 kg (08/09 0500)  Hemodynamic parameters for last 24 hours:    Intake/Output from previous day: 08/08 0701 - 08/09 0700 In: 6745.1 [I.V.:315.3; Blood:30; NG/GT:6150; IV Piggyback:199.9] Out: 1945 [Urine:975; Drains:620; Stool:350] Intake/Output this shift: No intake/output data recorded.  General appearance: alert, cooperative and no distress Neurologic: intact Heart: regular rate and rhythm Lungs: rhonchi bilaterally Abdomen: soft, nontender, bile draining from CT site RUQ Wound: clean and dry  Lab Results: Recent Labs    09/16/19 1934 09/17/19 0327  WBC 14.5* 13.1*  HGB 6.7* 6.7*  HCT 23.6* 22.5*  PLT 191 186   BMET:  Recent Labs    09/16/19 0220 09/16/19 0220 09/16/19 1714 09/17/19 0327  NA 144   < > 140 136  K 3.8   < > 5.0 4.7  CL 103  --   --  99  CO2 32  --   --  29  GLUCOSE 167*  --   --  133*  BUN 97*  --   --  114*  CREATININE 1.09  --   --  1.26*  CALCIUM 8.2*  --   --  7.9*   < > = values in this interval not displayed.    PT/INR: No results for input(s): LABPROT, INR in the last 72 hours. ABG    Component Value Date/Time   PHART 7.348 (L) 09/16/2019 1714   HCO3 32.0 (H) 09/16/2019 1714   TCO2 34 (H) 09/16/2019 1714   ACIDBASEDEF 1.0 08/08/2019 1115   O2SAT 64.0 09/17/2019 0327   CBG (last 3)  Recent Labs    09/17/19 0046 09/17/19 0342 09/17/19 0721  GLUCAP 140* 131* 116*     Assessment/Plan: S/P Procedure(s) (LRB): REMOVAL OF IMPELLA LEFT VENTRICULAR ASSIST DEVICE (N/A) TRANSESOPHAGEAL ECHOCARDIOGRAM (TEE) (N/A) - NEURO- looks much better today CV- in SR, agree with dc Eliquis  Co-ox 63 on milrinone 0.25 RESP- VDRF, CXR continues to show bilateral AS disease + pulmonary edema RENAL- creatinine normal   BUN increased despite normal co-ox, likely a low grade GIB as Hgb down again. Stop Eliquis, increase protonix to 40 mg BID ENDO- CBG mildly elevated Anemia secondary to ABL, suspect GI source- max dose protonix, stop Eliquis ID- afebrile and WBC down slightly on Diflucan and meropenem Deconditioning GI perc drain draining clear bile, tolerating TF   LOS: 53 days    Todd [name] 09/17/2019

## 2019-09-18 ENCOUNTER — Inpatient Hospital Stay (HOSPITAL_COMMUNITY): Payer: Medicare Other

## 2019-09-18 LAB — COMPREHENSIVE METABOLIC PANEL
ALT: 121 U/L — ABNORMAL HIGH (ref 0–44)
AST: 120 U/L — ABNORMAL HIGH (ref 15–41)
Albumin: 1.7 g/dL — ABNORMAL LOW (ref 3.5–5.0)
Alkaline Phosphatase: 513 U/L — ABNORMAL HIGH (ref 38–126)
Anion gap: 8 (ref 5–15)
BUN: 123 mg/dL — ABNORMAL HIGH (ref 8–23)
CO2: 33 mmol/L — ABNORMAL HIGH (ref 22–32)
Calcium: 8.1 mg/dL — ABNORMAL LOW (ref 8.9–10.3)
Chloride: 95 mmol/L — ABNORMAL LOW (ref 98–111)
Creatinine, Ser: 1.36 mg/dL — ABNORMAL HIGH (ref 0.61–1.24)
GFR calc Af Amer: >60 mL/min (ref >60)
GFR calc non Af Amer: 54 mL/min — ABNORMAL LOW (ref >60)
Glucose, Bld: 199 mg/dL — ABNORMAL HIGH (ref 70–99)
Potassium: 3.9 mmol/L (ref 3.5–5.1)
Sodium: 136 mmol/L (ref 135–145)
Total Bilirubin: 7 mg/dL — ABNORMAL HIGH (ref 0.3–1.2)
Total Protein: 5.4 g/dL — ABNORMAL LOW (ref 6.5–8.1)

## 2019-09-18 LAB — PHOSPHORUS: Phosphorus: 5.1 mg/dL — ABNORMAL HIGH (ref 2.5–4.6)

## 2019-09-18 LAB — BPAM RBC
Blood Product Expiration Date: 202108262359
ISSUE DATE / TIME: 202108090523
Unit Type and Rh: 6200

## 2019-09-18 LAB — TYPE AND SCREEN
ABO/RH(D): A POS
Antibody Screen: NEGATIVE
Unit division: 0

## 2019-09-18 LAB — CBC
HCT: 25.2 % — ABNORMAL LOW (ref 39.0–52.0)
Hemoglobin: 7.5 g/dL — ABNORMAL LOW (ref 13.0–17.0)
MCH: 29.1 pg (ref 26.0–34.0)
MCHC: 29.8 g/dL — ABNORMAL LOW (ref 30.0–36.0)
MCV: 97.7 fL (ref 80.0–100.0)
Platelets: 191 10*3/uL (ref 150–400)
RBC: 2.58 MIL/uL — ABNORMAL LOW (ref 4.22–5.81)
RDW: 17.8 % — ABNORMAL HIGH (ref 11.5–15.5)
WBC: 10.7 10*3/uL — ABNORMAL HIGH (ref 4.0–10.5)
nRBC: 0.2 % (ref 0.0–0.2)

## 2019-09-18 LAB — GLUCOSE, CAPILLARY
Glucose-Capillary: 130 mg/dL — ABNORMAL HIGH (ref 70–99)
Glucose-Capillary: 134 mg/dL — ABNORMAL HIGH (ref 70–99)
Glucose-Capillary: 148 mg/dL — ABNORMAL HIGH (ref 70–99)
Glucose-Capillary: 160 mg/dL — ABNORMAL HIGH (ref 70–99)
Glucose-Capillary: 175 mg/dL — ABNORMAL HIGH (ref 70–99)
Glucose-Capillary: 92 mg/dL (ref 70–99)

## 2019-09-18 LAB — CULTURE, RESPIRATORY W GRAM STAIN

## 2019-09-18 LAB — COOXEMETRY PANEL
Carboxyhemoglobin: 1.7 % — ABNORMAL HIGH (ref 0.5–1.5)
Methemoglobin: 0.6 % (ref 0.0–1.5)
O2 Saturation: 72.2 %
Total hemoglobin: 7.6 g/dL — ABNORMAL LOW (ref 12.0–16.0)

## 2019-09-18 LAB — MAGNESIUM: Magnesium: 2.7 mg/dL — ABNORMAL HIGH (ref 1.7–2.4)

## 2019-09-18 MED ORDER — POTASSIUM CHLORIDE 20 MEQ/15ML (10%) PO SOLN
20.0000 meq | Freq: Once | ORAL | Status: AC
  Administered 2019-09-18: 20 meq
  Filled 2019-09-18: qty 15

## 2019-09-18 MED ORDER — LEVOFLOXACIN IN D5W 750 MG/150ML IV SOLN
750.0000 mg | INTRAVENOUS | Status: AC
  Administered 2019-09-18 – 2019-10-01 (×14): 750 mg via INTRAVENOUS
  Filled 2019-09-18 (×14): qty 150

## 2019-09-18 MED ORDER — FUROSEMIDE 10 MG/ML IJ SOLN: 40.0000 mg | Freq: Every day | INTRAMUSCULAR | Status: DC

## 2019-09-18 NOTE — TOC Transition Note (Addendum)
Transition of Care [facility] - CM/SW Discharge Note   Patient Details  Name: Todd [name] MRN: [id number] Date of Birth: Aug 14, 1955  Transition of [facility]) CM/SW Contact:  [name], RN Phone Number: 09/18/2019, 10:54 AM   Clinical Narrative:    Case Management spoke with [name], [facility] with [facility] and unfortunately the patient has been declined for LTAC admission for second attempt at placement for LTAC. I will explore alternative rehab considerations for admission - possibly SNF placement with ventilator care support for this patient.  Alfredo Bach, MSW is aware and will look into possibilities for SNF placement in the area that will support this patient's care.   Final next level of care: Long Term Acute Care (LTAC) Barriers to Discharge: Insurance Authorization   Patient Goals and CMS Choice Patient states their goals for this hospitalization and ongoing recovery are:: Patient's wife would like patient to transfer to [facility] CMS Medicare.gov Compare Post Acute Care list provided to:: Patient Represenative (must comment) (Patient's spouse - [name]) Choice offered to / list presented to : Spouse  Discharge Placement                       Discharge Plan and Services   Discharge Planning Services: CM Consult Post Acute Care Choice: Long Term Acute Care (LTAC)                               Social Determinants of Health (SDOH) Interventions     Readmission Risk Interventions Readmission Risk Prevention Plan 09/03/2019  Transportation Screening Complete  PCP or Specialist Appt within 3-5 Days Complete  HRI or Home Care Consult Complete  Social Work Consult for Recovery Care Planning/Counseling Complete  Palliative Care Screening Complete  Medication Review Oceanographer) Complete  Some recent data might be hidden

## 2019-09-18 NOTE — Progress Notes (Signed)
32 Days Post-Op Procedure(s) (LRB): REMOVAL OF IMPELLA LEFT VENTRICULAR ASSIST DEVICE (N/A) TRANSESOPHAGEAL ECHOCARDIOGRAM (TEE) (N/A) Subjective: Alert, denies pain  Objective: Vital signs in last 24 hours: Temp:  [98.6 F (37 C)-99 F (37.2 C)] 98.8 F (37.1 C) (08/09 1531) Pulse Rate:  [74-82] 77 (08/10 0700) Cardiac Rhythm: Normal sinus rhythm (08/10 0000) Resp:  [15-31] 23 (08/10 0700) BP: (64-131)/(41-101) 117/56 (08/10 0700) SpO2:  [94 %-100 %] 98 % (08/10 0700) FiO2 (%):  [40 %-70 %] 40 % (08/10 0437) Weight:  [77.9 kg] 77.9 kg (08/10 0600)  Hemodynamic parameters for last 24 hours:    Intake/Output from previous day: 08/09 0701 - 08/10 0700 In: 1773.5 [I.V.:268.5; Blood:315; NG/GT:1190] Out: 3300 [Urine:2100; Drains:875; Stool:325] Intake/Output this shift: No intake/output data recorded.  General appearance: alert, cooperative, no distress and jaundiced Neurologic: non focal Heart: regular rate and rhythm Lungs: clear anteriorly Abdomen: normal findings: mildlly distended, nontender Wound: serous drainage from CT site  Lab Results: Recent Labs    09/17/19 0327 09/17/19 0327 09/17/19 1414 09/18/19 0449  WBC 13.1*  --   --  10.7*  HGB 6.7*   < > 7.6* 7.5*  HCT 22.5*   < > 24.9* 25.2*  PLT 186  --   --  191   < > = values in this interval not displayed.   BMET:  Recent Labs    09/17/19 0327 09/18/19 0449  NA 136 136  K 4.7 3.9  CL 99 95*  CO2 29 33*  GLUCOSE 133* 199*  BUN 114* 123*  CREATININE 1.26* 1.36*  CALCIUM 7.9* 8.1*    PT/INR: No results for input(s): LABPROT, INR in the last 72 hours. ABG    Component Value Date/Time   PHART 7.348 (L) 09/16/2019 1714   HCO3 32.0 (H) 09/16/2019 1714   TCO2 34 (H) 09/16/2019 1714   ACIDBASEDEF 1.0 08/08/2019 1115   O2SAT 72.2 09/18/2019 0429   CBG (last 3)  Recent Labs    09/17/19 2335 09/18/19 0426 09/18/19 0734  GLUCAP 200* 175* 148*    Assessment/Plan: S/P Procedure(s)  (LRB): REMOVAL OF IMPELLA LEFT VENTRICULAR ASSIST DEVICE (N/A) TRANSESOPHAGEAL ECHOCARDIOGRAM (TEE) (N/A) -remains critically ill CV- co-ox 72 in SR on milrinone 0.25 RESP- VDRF, CXR much better this AM, down to 40% but still on 12 PEEP RENAL- 1.5 L negative, creatinine up slightly to 1.36 ENDO- CBG moderately elevated Anemia- stable post transfusion Gi- tolerating TF, perc drain output relatively high Malnutrition- on TF ID- WBC trending down on meropenem, last day of fluconazole Deconditioning Skin- sacral decubitus- hydrotherapy   LOS: 54 days    [name] 09/18/2019

## 2019-09-18 NOTE — Progress Notes (Signed)
TCTS BRIEF SICU PROGRESS NOTE  32 Days Post-Op  S/P Procedure(s) (LRB): REMOVAL OF IMPELLA LEFT VENTRICULAR ASSIST DEVICE (N/A) TRANSESOPHAGEAL ECHOCARDIOGRAM (TEE) (N/A)   Stable day  Plan: Continue current plan  Purcell Nails, MD 09/18/2019 5:36 PM

## 2019-09-18 NOTE — Progress Notes (Signed)
Physical Therapy Treatment Patient Details Name: Todd [name] MRN: [id number] DOB: Sep 24, 1955 Today's Date: 09/18/2019    History of Present Illness Todd [name] is a 64 y.o. male presenting with shortness of breath; noted AKI, CHF, recent PNA, elevated troponin, pulm edema/effusion, and cardiomegaly.Marland Kitchen CABG 6/24 and intubated (extubated 6/25)--went into cardiac arrest on same date with re-opening of chest for heart massage and direct epicardial paddles and place on ECMO. 08/03/19 washout out for tamponade. 7/2 sternal closure and wound vac. 7/9 Impella removed.08/20/19 Tracheostomy 08/24/19 cholecystostomy tube. 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 PMHx: HFrEF, COPD, HTN, T2DM, HLD, MDD, chronic back pain, vitamin deficiency, BPH, tobacco use disorder, marijuana use disorder    PT Comments    Pt continues to make very slow progress. Worked sitting EOB to address balance, strength, and activity tolerance.    Follow Up Recommendations  LTACH;SNF     Equipment Recommendations  Other (comment) (to be determined in next venue)    Recommendations for Other Services       Precautions / Restrictions Precautions Precautions: Fall;Sternal Precaution Booklet Issued: No Precaution Comments: trach, chole drain, sacral wound Required Braces or Orthoses: Other Brace Other Brace: B Prevalon    Mobility  Bed Mobility Overal bed mobility: Needs Assistance Bed Mobility: Rolling;Supine to Sit;Sit to Supine Rolling: Total assist   Supine to sit: Total assist;+2 for physical assistance Sit to supine: Total assist;+2 for physical assistance   General bed mobility comments: Assist for all aspects. Pt stiff in trunk and lower extremities  Transfers                 General transfer comment: Not attempted  Ambulation/Gait                 Stairs             Wheelchair Mobility    Modified Rankin (Stroke Patients Only)       Balance  Overall balance assessment: Needs assistance Sitting-balance support: Feet supported;Bilateral upper extremity supported Sitting balance-Leahy Scale: Poor Sitting balance - Comments: Pt sat EOB x 10 minutes with mod assist with brief periods of min assist. Worked on exercises and trunk control while in sitting Postural control: Other (comment) (anterior lean)                                  Cognition Arousal/Alertness: Awake/alert Behavior During Therapy: Flat affect Overall Cognitive Status: Difficult to assess Area of Impairment: Following commands;Attention;Memory;Safety/judgement;Orientation                   Current Attention Level: Sustained   Following Commands: Follows one step commands inconsistently;Follows one step commands with increased time     Problem Solving: Slow processing;Decreased initiation;Difficulty sequencing;Requires verbal cues;Requires tactile cues        Exercises General Exercises - Lower Extremity Ankle Circles/Pumps: AAROM;PROM;Both;5 reps;Seated Long Arc Quad: AAROM;Both;5 reps;Seated Heel Slides: AAROM;Both;5 reps;Supine    General Comments        Pertinent Vitals/Pain Pain Assessment: Faces Faces Pain Scale: Hurts even more Pain Location: sacrum Pain Descriptors / Indicators: Grimacing;Discomfort Pain Intervention(s): Monitored during session;Repositioned;Patient requesting pain meds-RN notified    Home Living                      Prior Function            PT Goals (current  goals can now be found in the care plan section) Acute Rehab PT Goals Patient Stated Goal: pt unable to state Progress towards PT goals: Goals downgraded-see care plan    Frequency    Min 2X/week      PT Plan Current plan remains appropriate    Co-evaluation PT/OT/SLP Co-Evaluation/Treatment: Yes Reason for Co-Treatment: Complexity of the patient's impairments (multi-system involvement);For patient/therapist safety PT  goals addressed during session: Mobility/safety with mobility;Balance        AM-PAC PT "6 Clicks" Mobility   Outcome Measure  Help needed turning from your back to your side while in a flat bed without using bedrails?: Total Help needed moving from lying on your back to sitting on the side of a flat bed without using bedrails?: Total Help needed moving to and from a bed to a chair (including a wheelchair)?: Total Help needed standing up from a chair using your arms (e.g., wheelchair or bedside chair)?: Total Help needed to walk in hospital room?: Total Help needed climbing 3-5 steps with a railing? : Total 6 Click Score: 6    End of Session   Activity Tolerance: Patient limited by pain;Patient limited by fatigue Patient left: with call bell/phone within reach;in bed;with family/visitor present Nurse Communication: Mobility status;Other (comment) (condom cath came off) PT Visit Diagnosis: Unsteadiness on feet (R26.81);Muscle weakness (generalized) (M62.81);Difficulty in walking, not elsewhere classified (R26.2)     Time: 9450-3888 PT Time Calculation (min) (ACUTE ONLY): 32 min  Charges:  $Therapeutic Activity: 8-22 mins                     [facility] PT Acute Rehabilitation Services Pager [phone] Office [phone]    Angelina Ok [facility] 09/18/2019, 2:35 PM

## 2019-09-18 NOTE — NC FL2 (Signed)
[place] LEVEL OF CARE SCREENING TOOL     IDENTIFICATION  Patient Name: Todd [name] Birthdate: November 30, 1955 Sex: male Admission Date (Current Location): 07/26/2019  [facility] and [place] Number:  [employer] and Address:  [facility] [place], [place] [address], [place], [place] [address]      Provider Number: [id number]  Attending Physician Name and Address:  Melrose Nakayama, MD  Relative Name and Phone Number:       Current Level of Care: Hospital Recommended Level of Care: [place] Prior Approval Number:    Date Approved/Denied:   PASRR Number: Manual review  Discharge Plan: SNF    Current Diagnoses: Patient Active Problem List   Diagnosis Date Noted   Status post tracheostomy [facility])    Loculated pleural effusion    FUO (fever of unknown origin)    Acalculous cholecystitis 08/27/2019   ARDS (adult respiratory distress syndrome) ([place])    Cardiogenic shock ([place])    Candidemia ([place]) 08/25/2019   Hx of CABG 08/25/2019   Acute on chronic respiratory failure ([place])    Acute respiratory failure ([place])    Pressure injury of skin 08/12/2019   Malnutrition of moderate degree 08/10/2019   Coronary artery disease 08/02/2019   Coronary artery disease involving native coronary artery of native heart with unstable angina pectoris ([place])    Type 2 diabetes mellitus with hyperglycemia ([place]) 07/29/2019   AKI (acute kidney injury) ([place])    CHF (congestive heart failure) ([place]) 07/26/2019   Benign prostatic hyperplasia with lower urinary tract symptoms 07/26/2019   Chronic pain syndrome 07/26/2019   Essential hypertension 07/26/2019   Elective surgery 07/26/2019   Mixed hyperlipidemia 07/26/2019   Moderate recurrent major depression ([place]) 07/26/2019   Hypothyroidism 03/50/0938   Acute systolic heart failure ([place]) 07/26/2019    Orientation RESPIRATION BLADDER Height & Weight      (unable to  assess)  Tracheostomy, Vent (10L at 40%) Incontinent Weight: 171 lb 11.8 oz (77.9 kg) Height:  '5\' 9"'$  (175.3 cm)  BEHAVIORAL SYMPTOMS/MOOD NEUROLOGICAL BOWEL NUTRITION STATUS      Incontinent Feeding tube  AMBULATORY STATUS COMMUNICATION OF NEEDS Skin   Extensive Assist Verbally PU Stage and Appropriate Care       PU Stage 4 Dressing: Daily (sacrum, ABD, gauze, moist to dry)               Personal Care Assistance Level of Assistance  Bathing, Feeding, Dressing Bathing Assistance: Maximum assistance Feeding assistance: Maximum assistance Dressing Assistance: Maximum assistance     Functional Limitations Info  Speech, Hearing   Hearing Info: Impaired (hard of hearing) Speech Info: Impaired (trach)    [place]  PT (By licensed PT), OT (By licensed OT), Speech therapy     PT Frequency: 5x/wk OT Frequency: 5x/wk     Speech Therapy Frequency: 5x/wk      Contractures Contractures Info: Not present    Additional Factors Info  Code Status, Allergies, Psychotropic, Insulin Sliding Scale Code Status Info: Full Allergies Info: Empagliflozin, Heparin, Other, Simvastatin, Sitagliptin Psychotropic Info: Prozac '20mg'$  daily, Seroquel '50mg'$  daily at bed Insulin Sliding Scale Info: 0-24 units every 4 hours; 2 units every 4 hours, Levemir 12 units 2x/day       Current Medications (09/18/2019):  This is the current hospital active medication list Current Facility-Administered Medications  Medication Dose Route Frequency Provider Last Rate Last Admin   0.9 %  sodium chloride infusion (Manually program via  Guardrails IV Fluids)   Intravenous Once Candee Furbish, MD   Stopped at 09/13/19 1600   0.9 %  sodium chloride infusion   Intravenous PRN Melrose Nakayama, MD 10 mL/hr at 09/18/19 1200 Rate Verify at 09/18/19 1200   acetaminophen (TYLENOL) 160 MG/5ML solution 650 mg  650 mg Per Tube Q6H PRN Melrose Nakayama, MD   650 mg at 09/13/19 2223   amiodarone  (PACERONE) tablet 200 mg  200 mg Per Tube Daily Larey Dresser, MD   200 mg at 09/18/19 5573   aspirin chewable tablet 81 mg  81 mg Per Tube Daily Melrose Nakayama, MD   81 mg at 09/18/19 0900   chlorhexidine gluconate (MEDLINE KIT) (PERIDEX) 0.12 % solution 15 mL  15 mL Mouth Rinse BID Melrose Nakayama, MD   15 mL at 09/18/19 0900   Chlorhexidine Gluconate Cloth 2 % PADS 6 each  6 each Topical Daily Kipp Brood, MD   6 each at 09/17/19 2200   clonazePAM (KLONOPIN) tablet 1 mg  1 mg Oral BID Melrose Nakayama, MD   1 mg at 09/18/19 2202   collagenase (SANTYL) ointment   Topical Daily Melrose Nakayama, MD   Given at 09/18/19 0924   dextrose 50 % solution 0-50 mL  0-50 mL Intravenous PRN Melrose Nakayama, MD   50 mL at 08/23/19 0751   feeding supplement (PIVOT 1.5 CAL) liquid 1,000 mL  1,000 mL Per Tube Continuous Melrose Nakayama, MD 70 mL/hr at 09/17/19 1144 1,000 mL at 09/17/19 1144   fentaNYL (SUBLIMAZE) injection 25-100 mcg  25-100 mcg Intravenous Q2H PRN Brand Males, MD   100 mcg at 09/18/19 1422   FLUoxetine (PROZAC) 20 MG/5ML solution 20 mg  20 mg Per Tube Daily Candee Furbish, MD   20 mg at 09/18/19 0900   free water 200 mL  200 mL Per Tube Q4H Kara Mead V, MD   200 mL at 09/18/19 1240   [START ON 09/19/2019] furosemide (LASIX) injection 40 mg  40 mg Intravenous Daily Rigoberto Noel, MD       Gerhardt's butt cream   Topical PRN Kipp Brood, MD   Given at 09/11/19 2130   insulin aspart (novoLOG) injection 0-24 Units  0-24 Units Subcutaneous Q4H Melrose Nakayama, MD   2 Units at 09/18/19 1239   insulin aspart (novoLOG) injection 2 Units  2 Units Subcutaneous Q4H Candee Furbish, MD   2 Units at 09/18/19 1239   insulin detemir (LEVEMIR) injection 12 Units  12 Units Subcutaneous BID Melrose Nakayama, MD   12 Units at 09/18/19 0910   ipratropium-albuterol (DUONEB) 0.5-2.5 (3) MG/3ML nebulizer solution 3 mL  3 mL Nebulization  TID Melrose Nakayama, MD   3 mL at 09/18/19 1422   levofloxacin (LEVAQUIN) IVPB 750 mg  750 mg Intravenous Q24H Kara Mead V, MD 100 mL/hr at 09/18/19 1525 750 mg at 09/18/19 1525   lidocaine (LIDODERM) 5 % 1 patch  1 patch Transdermal Q24H Candee Furbish, MD   1 patch at 09/18/19 1242   lip balm (CARMEX) ointment   Topical PRN Melrose Nakayama, MD   Given at 09/12/19 [phone]   MEDLINE mouth rinse  15 mL Mouth Rinse 10 times per day Melrose Nakayama, MD   15 mL at 09/18/19 1241   metoprolol tartrate (LOPRESSOR) injection 2.5-5 mg  2.5-5 mg Intravenous Q2H PRN Melrose Nakayama, MD  midodrine (PROAMATINE) tablet 5 mg  5 mg Per Tube TID WC Larey Dresser, MD   5 mg at 09/18/19 1240   milrinone (PRIMACOR) 20 MG/100 ML (0.2 mg/mL) infusion  0.25 mcg/kg/min Intravenous Continuous Candee Furbish, MD 5.64 mL/hr at 09/18/19 1200 0.25 mcg/kg/min at 09/18/19 1200   nutrition supplement (JUVEN) (JUVEN) powder packet 1 packet  1 packet Per Tube BID BM Melrose Nakayama, MD   1 packet at 09/18/19 1243   ondansetron (ZOFRAN) injection 4 mg  4 mg Intravenous Q6H PRN Melrose Nakayama, MD   4 mg at 09/17/19 0515   pantoprazole (PROTONIX) injection 40 mg  40 mg Intravenous Q12H Melrose Nakayama, MD   40 mg at 09/18/19 0900   QUEtiapine (SEROQUEL) tablet 50 mg  50 mg Oral QHS Candee Furbish, MD   50 mg at 09/17/19 2116   silver nitrate applicators applicator 1 Stick  1 Stick Topical PRN Melrose Nakayama, MD       sodium chloride flush (NS) 0.9 % injection 10-40 mL  10-40 mL Intracatheter Q12H Melrose Nakayama, MD   10 mL at 09/18/19 0926   sodium chloride flush (NS) 0.9 % injection 10-40 mL  10-40 mL Intracatheter PRN Melrose Nakayama, MD       sodium chloride flush (NS) 0.9 % injection 3 mL  3 mL Intravenous Q12H Melrose Nakayama, MD   3 mL at 09/18/19 0926   sodium chloride flush (NS) 0.9 % injection 5 mL  5 mL Intracatheter Q8H  Melrose Nakayama, MD   5 mL at 09/18/19 1246   thiamine (B-1) injection 100 mg  100 mg Intravenous Daily Paulita Fujita B, NP   100 mg at 09/18/19 0900   traZODone (DESYREL) tablet 50 mg  50 mg Per Tube QHS PRN Melrose Nakayama, MD   50 mg at 09/15/19 2101     Discharge Medications: Please see discharge summary for a list of discharge medications.  Relevant Imaging Results:  Relevant Lab Results:   Additional Information SS#: [id number]  Geralynn Ochs, LCSW

## 2019-09-18 NOTE — Progress Notes (Signed)
Nutrition Follow-up  DOCUMENTATION CODES:   Non-severe (moderate) malnutrition in context of acute illness/injury  INTERVENTION:   Tube Feeding:  -Pivot 1.5 to 70 ml/hr via PEG -Free water flushes 200 ml Q4 hours Provides 158 g of protein, 2520 kcals, 1277 mL of free water  Continue Juven BID, each packet provides 80 calories, 8 grams of carbohydrate, 2.5  grams of protein (collagen), 7 grams of L-arginine and 7 grams of L-glutamine; supplement contains CaHMB, Vitamins C, E, B12 and Zinc to promote wound healing  NUTRITION DIAGNOSIS:   Moderate Malnutrition related to acute illness as evidenced by mild fat depletion, mild muscle depletion, moderate muscle depletion.  Ongoing  GOAL:   Patient will meet greater than or equal to 90% of their needs  Addressed via TF  MONITOR:   Vent status, TF tolerance, Labs, Weight trends  REASON FOR ASSESSMENT:   Consult  ([facility] tube feeding recommendations)  ASSESSMENT:   Patient with PMH significant for CHF, COPD, HTN, DM, HLD, MDD, and BPH. Presents this admission with CHF exacerbation.  6/24- s/p CABG x4 6/25- VT arrest, emergent sternotomy, VA ECMO cannulation 6/28- De-cannulated, Impella placed 6/29-Bedside Mediastinal Exploration 2/2 tamponade with multiple blood clots removed 7/02 OR for sternal closure with application of wound vac, Cortrak placed but post-pyloric placement unsuccessful 7/03 TF stared at 20 ml/hr but pt with emesis, OG to LWS with 850 mL out 7/04 TPN started at 30 ml/hr 7/05 TPN remains at 30 ml/hr 7/06 TPN at 55 ml/hr, IR placed 10 fr post-pyloric feeding tube. Trickle TF initiated 7/07 TF titration, TPN discontinued 7/09 HIT positive, bivalirudin started 7/09 Impella removed 7/12 Trach placed 7/16 Cholecystostomy tube 7/27 PEG tube  Pt discussed during ICU rounds and with RN.   Remains on vent. Anxiety and secretions barrier to weaning. Sacral wound slowly improving with hydrotherapy. Tolerating  tube feeding at goal. Diarrhea continues. Off questran. CBG improved.    Pt has been declined for LTAC. Plan possible d/c to vent SNF.   Admission weight: 77 kg  Current weight: 77.9 kg (up 9 kg over the last week)  I/O: +12,218 ml since 7/27 UOP: 2,100 ml x 24 hrs Bili drain: 875 ml x 24 hrs  Stool: 325 ml x 24 hrs   Drips: milrinone  Medications: 40 mg lasix daily, SS novolog, levemir, thiamine Labs: Phosphorus 5.1 (H) Mg 2.7 (H) Cr 1.36-trending up CBG 125-202  Diet Order:   Diet Order            Diet NPO time specified  Diet effective midnight                 EDUCATION NEEDS:   Education needs have been addressed  Skin:  Skin Assessment: Skin Integrity Issues: Skin Integrity Issues::  Unstageable: Sacrum (DTI evolved to [facility] RN following Incisions: R leg, R groin Skin tear: L arm  Last BM:  8/10 via rectal tube  Height:   Ht Readings from Last 1 Encounters:  08/27/19 5\' 9"  (1.753 m)    Weight:   Wt Readings from Last 1 Encounters:  09/18/19 77.9 kg    BMI:  Body mass index is 25.36 kg/m.  Estimated Nutritional Needs:   Kcal:  11/18/19 kcals  Protein:  130-160 g  Fluid:  >/= 1.8 L/day   [id number] RD, LDN Clinical Nutrition Pager listed in AMION

## 2019-09-18 NOTE — Progress Notes (Addendum)
Occupational Therapy Treatment Patient Details Name: Todd [name] MRN: [id number] DOB: 1955-08-20 Today's Date: 09/18/2019    History of present illness Todd [name] is a 64 y.o. male presenting with shortness of breath; noted AKI, CHF, recent PNA, elevated troponin, pulm edema/effusion, and cardiomegaly.Marland Kitchen CABG 6/24 and intubated (extubated 6/25)--went into cardiac arrest on same date with re-opening of chest for heart massage and direct epicardial paddles and place on ECMO. 08/03/19 washout out for tamponade. 7/2 sternal closure and wound vac. 7/9 Impella removed.08/20/19 Tracheostomy 08/24/19 cholecystostomy tube. 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 PMHx: HFrEF, COPD, HTN, T2DM, HLD, MDD, chronic back pain, vitamin deficiency, BPH, tobacco use disorder, marijuana use disorder   OT comments  Pt agreeable to working with therapies; tolerating sitting EOB approx 10 min during session today. Pt requiring +2 totalA for completion of bed mobility, but once EOB able to maintain static balance grossly with modA (bouts of minA). Pt engaging in seated UB/LB AAROM and simple grooming ADL with hand over hand assist to initiate. He continues to have limitations due to pain in sacral region, overall weakness and deconditioning. Noted increased stiffness, ?clonus and decreased ROM in LUE compared to previous OT sessions. VSS throughout (BP appropriate seated EOB) and on trach/vent (40% fiO2). Acute OT goals/goal date updated at this time. Will continue per POC.   Follow Up Recommendations  LTACH;Supervision/Assistance - 24 hour    Equipment Recommendations  Other (comment) (defer to next venue)          Precautions / Restrictions Precautions Precautions: Fall;Sternal Precaution Booklet Issued: No Precaution Comments: trach, chole drain, sacral wound Required Braces or Orthoses: Other Brace Other Brace: B Prevalon       Mobility Bed Mobility Overal bed mobility:  Needs Assistance Bed Mobility: Rolling;Supine to Sit;Sit to Supine Rolling: Total assist   Supine to sit: Total assist;+2 for physical assistance Sit to supine: Total assist;+2 for physical assistance   General bed mobility comments: Assist for all aspects. Pt stiff in trunk and lower extremities  Transfers                 General transfer comment: Not attempted    Balance Overall balance assessment: Needs assistance Sitting-balance support: Feet supported;Bilateral upper extremity supported Sitting balance-Leahy Scale: Poor Sitting balance - Comments: Pt sat EOB x 10 minutes with mod assist with brief periods of min assist. Worked on exercises and trunk control while in sitting Postural control: Other (comment) (anterior lean)                                 ADL either performed or assessed with clinical judgement   ADL Overall ADL's : Needs assistance/impaired Eating/Feeding: NPO   Grooming: Moderate assistance;Sitting;Wash/dry face Grooming Details (indicate cue type and reason): performing using RUE seated EOB                             Functional mobility during ADLs: Total assistance;+2 for physical assistance;+2 for safety/equipment General ADL Comments: pt tolerating sitting EOB approx 10 min during session                        Cognition Arousal/Alertness: Awake/alert Behavior During Therapy: Flat affect Overall Cognitive Status: Difficult to assess Area of Impairment: Following commands;Attention;Memory;Safety/judgement;Orientation  Current Attention Level: Sustained   Following Commands: Follows one step commands inconsistently;Follows one step commands with increased time     Problem Solving: Slow processing;Decreased initiation;Difficulty sequencing;Requires verbal cues;Requires tactile cues          Exercises Exercises: General Lower Extremity General Exercises - Upper  Extremity Shoulder Flexion: AAROM;Both;5 reps;Seated (pushing forward, grossly to 90*) General Exercises - Lower Extremity Ankle Circles/Pumps: AAROM;PROM;Both;5 reps;Seated Long Arc Quad: AAROM;Both;5 reps;Seated Heel Slides: AAROM;Both;5 reps;Supine Other Exercises Other Exercises: passive ranging to LUE given increased stiffness noted, ?clonus with movement    Shoulder Instructions       General Comments      Pertinent Vitals/ Pain       Pain Assessment: Faces Faces Pain Scale: Hurts even more Pain Location: sacrum Pain Descriptors / Indicators: Grimacing;Discomfort Pain Intervention(s): Monitored during session;Repositioned;Patient requesting pain meds-RN notified  Home Living                                          Prior Functioning/Environment              Frequency  Min 2X/week        Progress Toward Goals  OT Goals(current goals can now be found in the care plan section)  Progress towards OT goals: Progressing toward goals  Acute Rehab OT Goals Patient Stated Goal: pt unable to state OT Goal Formulation: Patient unable to participate in goal setting Time For Goal Achievement: 10/02/19 Potential to Achieve Goals: Fair  Plan Discharge plan remains appropriate    Co-evaluation    PT/OT/SLP Co-Evaluation/Treatment: Yes Reason for Co-Treatment: Complexity of the patient's impairments (multi-system involvement);For patient/therapist safety PT goals addressed during session: Mobility/safety with mobility;Balance OT goals addressed during session: ADL's and self-care      AM-PAC OT "6 Clicks" Daily Activity     Outcome Measure   Help from another person eating meals?: Total Help from another person taking care of personal grooming?: A Lot Help from another person toileting, which includes using toliet, bedpan, or urinal?: Total Help from another person bathing (including washing, rinsing, drying)?: Total Help from another person to  put on and taking off regular upper body clothing?: Total Help from another person to put on and taking off regular lower body clothing?: Total 6 Click Score: 7    End of Session Equipment Utilized During Treatment: Oxygen  OT Visit Diagnosis: Other abnormalities of gait and mobility (R26.89);Muscle weakness (generalized) (M62.81);Other symptoms and signs involving cognitive function;Pain Pain - Right/Left: Left Pain - part of body:  (shoulder, and sacrum)   Activity Tolerance Patient tolerated treatment well   Patient Left in bed;with call bell/phone within reach   Nurse Communication Mobility status;Patient requests pain meds        Time: 1335-1407 OT Time Calculation (min): 32 min  Charges: OT General Charges $OT Visit: 1 Visit OT Treatments $Self Care/Home Management : 8-22 mins  Marcy Siren, OT Acute Rehabilitation Services Pager [phone] Office [phone]    [name] 09/18/2019, 3:49 PM

## 2019-09-18 NOTE — Progress Notes (Cosign Needed)
Name: Todd [name] DOB: 03/03/1955  Please be advised that the above-named patient will require a short-term nursing home stay -- anticipated 30 days or less for rehabilitation and strengthening. The plan is for return home.

## 2019-09-18 NOTE — Progress Notes (Signed)
Physical Therapy Wound Treatment Patient Details  Name: Todd [name] MRN: [id number] Date of Birth: 10-11-1955  Today's Date: 09/18/2019 Time: 1031-2811 Time Calculation (min): 30 min  Subjective  Subjective: pt asleep on entry with limited communication during session   Pain Score: Pt premedicated prior to treatment, however pt with 8/10 facial pain score during pulse lavage.   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 has evolved into stage 4 when assessed on 8/9 (Active)  Wound Image   09/11/19 1625  Dressing Type ABD;Barrier Film (skin prep);Gauze (Comment);Moist to moist;Non adherent 09/18/19 1300  Dressing Changed;Clean;Dry;Intact 09/18/19 1300  Dressing Change Frequency Daily 09/18/19 1300  State of Healing Non-healing 09/18/19 1300  Site / Wound Assessment Yellow;Pink;Painful;Brown 09/18/19 1300  % Wound base Red or Granulating 70% 09/18/19 1300  % Wound base Yellow/Fibrinous Exudate 25% 09/18/19 1300  % Wound base Black/Eschar 0% 09/17/19 1600  % Wound base Other/Granulation Tissue (Comment) 5% 09/18/19 1300  Peri-wound Assessment Bleeding;Pink;Maceration 09/18/19 1300  Wound Length (cm) 8 cm 09/15/19 0900  Wound Width (cm) 5 cm 09/15/19 0900  Wound Depth (cm) 2.5 cm 09/15/19 0900  Wound Surface Area (cm^2) 40 cm^2 09/15/19 0900  Wound Volume (cm^3) 100 cm^3 09/15/19 0900  Margins Attached edges (approximated) 09/18/19 1300  Drainage Amount Moderate 09/18/19 1300  Drainage Description Serosanguineous 09/18/19 1300  Treatment Debridement (Selective);Hydrotherapy (Pulse lavage);Packing (Saline gauze);Other (Comment) 09/18/19 1300   Santyl to necrotic tissue    Hydrotherapy Pulsed lavage therapy - wound location: sacrum Pulsed Lavage with Suction (psi): 12 psi Pulsed Lavage with Suction - Normal Saline Used: 1000 mL Pulsed Lavage Tip: Tip with splash shield Selective Debridement Selective  Debridement - Location: sacrum Selective Debridement - Tools Used: Forceps;Scissors Selective Debridement - Tissue Removed: yellow/gray slough   Wound Assessment and Plan  Wound Therapy - Assess/Plan/Recommendations Wound Therapy - Clinical Statement: Pt sleepy during session with only brief eyeopening during pulse lavage likely due to pain. Wound bed is at level of sacral bone and only has thin stringy slough layer over top, walls of wound are beginning to exhibit more viable tissue. Rim of wound continues to have necrotic tissue that benefits from selective debridement, to decreased bioburden to wound. PT will continue pulse lavage and selective debridement of necrotic material to improve healing potential.  Wound Therapy - Functional Problem List: decreased mobility Factors Delaying/Impairing Wound Healing: Diabetes Mellitus;Incontinence;Immobility;Multiple medical problems Hydrotherapy Plan: Debridement;Dressing change;Patient/family education;Pulsatile lavage with suction Wound Therapy - Frequency: 6X / week Wound Therapy - Follow Up Recommendations: Other (comment) (LTACH) Wound Plan: see above  Wound Therapy Goals- Improve the function of patient's integumentary system by progressing the wound(s) through the phases of wound healing (inflammation - proliferation - remodeling) by: Decrease Necrotic Tissue to: 20 Decrease Necrotic Tissue - Progress: Progressing toward goal Increase Granulation Tissue to: 80 Increase Granulation Tissue - Progress: Progressing toward goal Time For Goal Achievement: 7 days Wound Therapy - Potential for Goals: Fair  Goals will be updated until maximal potential achieved or discharge criteria met.  Discharge criteria: when goals achieved, discharge from hospital, MD decision/surgical intervention, no progress towards goals, refusal/missing three consecutive treatments without notification or medical reason.  GP    Dani Gobble. Migdalia Dk PT, DPT Acute  Rehabilitation Services Pager [phone] Office [phone]  [place] 09/18/2019, 1:52 PM

## 2019-09-18 NOTE — Progress Notes (Addendum)
NAME:  Todd [name], MRN:  [id number], DOB:  Oct 31, 1955, LOS: 57 ADMISSION DATE:  07/26/2019, CONSULTATION DATE:  08/03/19 REFERRING MD:  Roxan Hockey, CHIEF COMPLAINT:  ECMO   Brief History   64 yo male admitted with CHF exacerbation, found to have multi-vessel disease requiring CABG 6/24, post op VT Arrest 6/25 and cannulated for VA ECMO.    History of present illness   Presented with worsening dyspnea 6/17 c/w CHF exacerbation.  LHC  with severe triple vessel disease.  Underwent CABG 6/24. Vtach arrest 6/25.  Chest opened bedside and cardiac massage initiated as well as multiple cardioversions, amiodarone, bicarb etc.  Brought to OR and cannulated for VA ECMO.  PCCM consulted to assist with management  Comorbidities include DM, heavy smoking, COPD  Past Medical History  Depression Ischemic cardiomyopathy HTN HLD  Significant Hospital Events   6/24 CABG 6/25 VA cannulation 6/28 decannulated and impella placed 6/29 bedside re-exploration; s/p decannulation. Placement of impella device originally at p8 but decreased to p4 2/2 suction events overnight up to p6. Echo completed and repositioned device. Remained on considerable support with 8 epi and 46 norepi vaso 0.05. continued chest tube output with large clots noted. Heparin thru device. Replacement products ongoing. 60% 8 peep 6/30 bedside mediastinal re-exploration and clean out of hematoma with tamponade and worsening hemodynamics. Pt had large volume transfusion. rebolused with amio 2/2 nsvt episode.  Weight up 48 pounds.  Started diuresis, Lasix drip started. 7/01 iatrogenic respiratory alkalosis, vent rate decreased. 7/02 No significant issues overnight remains on pressors and Impella not tolerating tube feeds with high gastric output awaiting core track placement 7/03 started trickle TF  7/05 Swab removed, CVL and PICC placed. 7/13 Trach and BAL 7/16 Awake and tracking. No follow commands. Still having fevers. Successful  ultrasound and CT guided placement of a 10.2 French cholecystostomy tube. A small amount of aspirated bile was capped and sent to the laboratory for analysis 7/17 Off sedation, PSV.  Beginning to follow some commands occasionally intermittently.  Very jaundiced.  Growing Candida in the blood and BAL > diflucan.   7/21 Ongoing fevers, WBC increased 7/22 Persistent fevers, pan cultured with CT Chest/Head. Suspected source tracheobronchitis.  7/28 Transitioned to eliquis, jaundiced but labs improving  7/29 Agitation/restless.  Trazodone added QHS. Weaning midodrine, attempting diuresis  7/30 Increased Na, weaning on PSV.  8/01 On 40% ATC, significantly improved mental status 8/1>> ongoing attempts at trach weaning complicated by weakness, secretion management, and anxiety 8/8 started back on meropenem and milrinone  Consults:  Advanced heart failure, PCCM, TCTS  Procedures:  LUE PICC 7/5 >>7/21 R Brachial ALine 7/9 >>7/27 Chest Tubes >> L IJ CVC 7/21 >>7/27 Peg tube 7/27 >>  Significant Diagnostic Tests:  6/22 spirometry with restrictive physiology, preserved FEV1/FVC 6/18 echo: LVEF 50-35%, grade I diastolic dysfunction 4/65 US Abdomen cholelithiasis without cholecystitis. Mild wall thinking in setting of liver disease and ascites.  7/10 LE [place] negative for DVT  7/20 CT ABD w contrast >> stable position of the percutaneous cholecystostomy tube with complete decompression of the gallbladder, trace residual free fluid within the RUQ, decreased since prior, trace bilateral pleural effusions L>R, scattered areas of ground glass airspace disease within the RML, RLL 7/22 CT Chest w/o Contrast >> Retrosternal fluid collection, non specific.  Bilateral pleural effusions, portions loculated.   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 Stable bilateral mastoid and middle ear effusions.   Micro  Data:  7/14 blood >>  candida parapsilosis resp 8/7 >> GNR >  Antimicrobials:  Meropenem 8/8 >> Diflucan  7/16 >> vanc 7/22 >>   Interim history/subjective:    Remains critically ill, tracheostomy On 50%/PEEP of 8   Objective   Blood pressure (!) 117/56, pulse 77, temperature 98.2 F (36.8 C), temperature source Oral, resp. rate (!) 23, height _0  (1.753 m), weight 77.9 kg, SpO2 98 %.    Vent Mode: PRVC FiO2 (%):  [30 %-60 %] 30 % Set Rate:  [12 bmp-14 bmp] 14 bmp Vt Set:  [420 mL] 420 mL PEEP:  [8 cmH20-12 cmH20] 8 cmH20 Plateau Pressure:  [16 cmH20-24 cmH20] 18 cmH20   Intake/Output Summary (Last 24 hours) at 09/18/2019 0933 Last data filed at 09/18/2019 0900 Gross per 24 hour  Intake 1854.37 ml  Output 3415 ml  Net -1560.63 ml   Filed Weights   09/16/19 0500 09/17/19 0500 09/18/19 0600  Weight: 75.2 kg 77.3 kg 77.9 kg   Exam GEN: jaundiced frail elderly man on vent HEENT: MM dry, tracheostomy CDI  CV: RRR, ext warm PULM: Scattered rhonci, triggering vent GI: Soft, +BS EXT: Muscle wasting,  ischemic changes on right finger, scattered bruising NEURO: awake, moves all 4 ext to command, profoundly weak PSYCH: RASS -1 SKIN: Jandiced, bruising , stage IV sacral decubitus, clean edges and floor  875 cc from biliary drain. Labs show drop in sodium to 136 , hemoglobin improved from 6.7-7.5, resolved leukocytosis, stable LFTs Chest x-ray personally reviewed which shows improved bilateral airspace disease  Lab Results  Component Value Date   WBC 10.7 (H) 09/18/2019   HGB 7.5 (L) 09/18/2019   HCT 25.2 (L) 09/18/2019   MCV 97.7 09/18/2019   PLT 191 09/18/2019   Lab Results  Component Value Date   CREATININE 1.36 (H) 09/18/2019   BUN 123 (H) 09/18/2019   NA 136 09/18/2019   K 3.9 09/18/2019   CL 95 (L) 09/18/2019   CO2 33 (H) 09/18/2019   Resolved Hospital Problem list   Suspected HCAP Acute kidney injury, improved  Hypernatremia, improved  Acute Metabolic Encephalopathy    -CTH without acute intracranial pathology. EEG negative 7/23.  Shock Heparin-induced thrombocytopenia >> resolved, required bivalirudin  Assessment & Plan:    Acute hypoxic respiratory failure w/ need for mechanical ventilation - status post tracheostomy- Poor lung mechanics due to weakness, deconditioning  8/8: worsening pulmonary edema pattern on CXR but also r/o HCAP, trach aspirate GNR and abundant GNR - Continue PS trials with goal trach collar - Work with PT - Will likely need LTACH -Continue meropenem for 7 days, await respiratory culture - Continue xopenex - CPT, pulm hygiene   Cardiogenic/hemorrhagic shock status post CABG complicated by VT arrest, improved S/p VA ECMO (decanulated 6/28). S/p impella placement (6/28> 7/9) -continue midodrine -Decrease Lasix 40 daily -Milrinone added 8/8, current SVO2 acceptable -follow I/O's  -post operative care per TCTS   Atrial flutter - ASA -? stop amiodarone if LFTs continue to rise -tele monitoring    Hyperbilirubinemia due to acalculous cholecystitis Possible cirrhosis per 7/8 [place]  Status post  percutaneous GB drain  Jaundice-bilirubin rising -drain care per IR , additional dressing at the chest tube site -Trend tbili, high biliary drainage  DM II with fluctuant hypo/hyperglycemia -monitor glucose  -continue TF  -BID levemir 12 q 12h+ rSSI   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  -ID assistance appreciated  -Diflucan through 8/10  Acute blood  loss anemia and critical illness anemia , sp 1 U PRBC 8/5 & 8/9  HIT, resolved  P -dc eliquis , if hemoglobin stays stable, start Arixtra for DVT prophylaxis  Acute kidney injury, improved  Hypernatremia, improved  -Trend BMP / urinary output -Replace electrolytes as indicated -Avoid nephrotoxic agents, ensure adequate renal perfusion - decrease free water 200 q 4h  Anxiety -likely ICU related stress disorder - Prozac  daily, seroquel qHS, klonipin  Sacral Decubitus  -appreciate WOC assistance    Deconditioning of Critical Illness  -PT / OT efforts  Small left adrenal mass seen on CT scan of the abdomen August 24, 2019 - OP f/u  Daily Goals Checklist  Pain/Anxiety/Delirium protocol (if indicated): see above  VAP protocol (if indicated): bundle in place.  DVT prophylaxis: eliquis Nutrition Status: TF  GI prophylaxis: PPI Urinary catheter: n/a Central lines: removed Glucose control: See above Mobility/therapy needs: PT for ROM  Code Status: Full  Family Communication: per primary Disposition: ICU --  will need LTACH  The patient is critically ill with multiple organ systems failure and requires high complexity decision making for assessment and support, frequent evaluation and titration of therapies, application of advanced monitoring technologies and extensive interpretation of multiple databases. Critical Care Time devoted to patient care services described in this note independent of APP/resident  time is 31 minutes.      Kara Mead MD. Shade Flood. [facility]  If no response to pager , please call 319 [phone]   09/18/2019

## 2019-09-19 DIAGNOSIS — B377 Candidal sepsis: Secondary | ICD-10-CM | POA: Diagnosis not present

## 2019-09-19 DIAGNOSIS — J9601 Acute respiratory failure with hypoxia: Secondary | ICD-10-CM | POA: Diagnosis not present

## 2019-09-19 DIAGNOSIS — N179 Acute kidney failure, unspecified: Secondary | ICD-10-CM | POA: Diagnosis not present

## 2019-09-19 LAB — GLUCOSE, CAPILLARY
Glucose-Capillary: 149 mg/dL — ABNORMAL HIGH (ref 70–99)
Glucose-Capillary: 151 mg/dL — ABNORMAL HIGH (ref 70–99)
Glucose-Capillary: 160 mg/dL — ABNORMAL HIGH (ref 70–99)
Glucose-Capillary: 167 mg/dL — ABNORMAL HIGH (ref 70–99)
Glucose-Capillary: 170 mg/dL — ABNORMAL HIGH (ref 70–99)
Glucose-Capillary: 210 mg/dL — ABNORMAL HIGH (ref 70–99)

## 2019-09-19 LAB — COMPREHENSIVE METABOLIC PANEL
ALT: 114 U/L — ABNORMAL HIGH (ref 0–44)
AST: 124 U/L — ABNORMAL HIGH (ref 15–41)
Albumin: 1.4 g/dL — ABNORMAL LOW (ref 3.5–5.0)
Alkaline Phosphatase: 618 U/L — ABNORMAL HIGH (ref 38–126)
Anion gap: 9 (ref 5–15)
BUN: 132 mg/dL — ABNORMAL HIGH (ref 8–23)
CO2: 30 mmol/L (ref 22–32)
Calcium: 7.7 mg/dL — ABNORMAL LOW (ref 8.9–10.3)
Chloride: 98 mmol/L (ref 98–111)
Creatinine, Ser: 1.25 mg/dL — ABNORMAL HIGH (ref 0.61–1.24)
GFR calc Af Amer: >60 mL/min (ref >60)
GFR calc non Af Amer: >60 mL/min (ref >60)
Glucose, Bld: 175 mg/dL — ABNORMAL HIGH (ref 70–99)
Potassium: 3.8 mmol/L (ref 3.5–5.1)
Sodium: 137 mmol/L (ref 135–145)
Total Bilirubin: 6 mg/dL — ABNORMAL HIGH (ref 0.3–1.2)
Total Protein: 5.1 g/dL — ABNORMAL LOW (ref 6.5–8.1)

## 2019-09-19 LAB — CBC
HCT: 24.5 % — ABNORMAL LOW (ref 39.0–52.0)
Hemoglobin: 7.4 g/dL — ABNORMAL LOW (ref 13.0–17.0)
MCH: 29.5 pg (ref 26.0–34.0)
MCHC: 30.2 g/dL (ref 30.0–36.0)
MCV: 97.6 fL (ref 80.0–100.0)
Platelets: 201 10*3/uL (ref 150–400)
RBC: 2.51 MIL/uL — ABNORMAL LOW (ref 4.22–5.81)
RDW: 17.7 % — ABNORMAL HIGH (ref 11.5–15.5)
WBC: 10.2 10*3/uL (ref 4.0–10.5)
nRBC: 0 % (ref 0.0–0.2)

## 2019-09-19 LAB — COOXEMETRY PANEL
Carboxyhemoglobin: 2.1 % — ABNORMAL HIGH (ref 0.5–1.5)
Methemoglobin: 1.1 % (ref 0.0–1.5)
O2 Saturation: 72.6 %
Total hemoglobin: 7.7 g/dL — ABNORMAL LOW (ref 12.0–16.0)

## 2019-09-19 LAB — PHOSPHORUS: Phosphorus: 4.4 mg/dL (ref 2.5–4.6)

## 2019-09-19 LAB — MAGNESIUM: Magnesium: 2.6 mg/dL — ABNORMAL HIGH (ref 1.7–2.4)

## 2019-09-19 MED ORDER — OXYCODONE HCL 5 MG PO TABS
5.0000 mg | ORAL_TABLET | ORAL | Status: DC | PRN
  Administered 2019-09-19 (×2): 5 mg via ORAL
  Administered 2019-09-20 – 2019-10-01 (×14): 10 mg via ORAL
  Administered 2019-10-07 – 2019-10-09 (×2): 5 mg via ORAL
  Filled 2019-09-19 (×5): qty 2
  Filled 2019-09-19: qty 1
  Filled 2019-09-19: qty 2
  Filled 2019-09-19: qty 1
  Filled 2019-09-19 (×11): qty 2
  Filled 2019-09-19: qty 1

## 2019-09-19 MED ORDER — FENTANYL CITRATE (PF) 100 MCG/2ML IJ SOLN
50.0000 ug | INTRAMUSCULAR | Status: DC | PRN
  Administered 2019-09-20 – 2019-10-09 (×12): 50 ug via INTRAVENOUS
  Filled 2019-09-19 (×16): qty 2

## 2019-09-19 MED ORDER — FONDAPARINUX SODIUM 2.5 MG/0.5ML ~~LOC~~ SOLN
2.5000 mg | Freq: Every day | SUBCUTANEOUS | Status: DC
  Administered 2019-09-19 – 2019-09-20 (×2): 2.5 mg via SUBCUTANEOUS
  Filled 2019-09-19 (×3): qty 0.5

## 2019-09-19 NOTE — Progress Notes (Signed)
33 Days Post-Op Procedure(s) (LRB): REMOVAL OF IMPELLA LEFT VENTRICULAR ASSIST DEVICE (N/A) TRANSESOPHAGEAL ECHOCARDIOGRAM (TEE) (N/A) Subjective: comfortable  Objective: Vital signs in last 24 hours: Temp:  [98.1 F (36.7 C)-99.1 F (37.3 C)] 98.4 F (36.9 C) (08/11 0750) Pulse Rate:  [72-86] 74 (08/11 0734) Cardiac Rhythm: Normal sinus rhythm (08/11 0356) Resp:  [15-30] 18 (08/11 0734) BP: (100-130)/(42-94) 119/48 (08/11 0700) SpO2:  [92 %-99 %] 94 % (08/11 0734) FiO2 (%):  [40 %-60 %] 40 % (08/11 0734) Weight:  [78.4 kg] 78.4 kg (08/11 0305)  Hemodynamic parameters for last 24 hours:    Intake/Output from previous day: 08/10 0701 - 08/11 0700 In: 2244.2 [I.V.:369.2; NG/GT:1680; IV Piggyback:150] Out: 3020 [Urine:2465; Drains:355; Stool:200] Intake/Output this shift: No intake/output data recorded.  General appearance: alert, cooperative and no distress Neurologic: intact Heart: regular rate and rhythm Lungs: clear to auscultation bilaterally Abdomen: normal findings: soft, non-tender Wound: intact, still with serous drainage from CT site  Lab Results: Recent Labs    09/18/19 0449 09/19/19 0321  WBC 10.7* 10.2  HGB 7.5* 7.4*  HCT 25.2* 24.5*  PLT 191 201   BMET:  Recent Labs    09/18/19 0449 09/19/19 0321  NA 136 137  K 3.9 3.8  CL 95* 98  CO2 33* 30  GLUCOSE 199* 175*  BUN 123* 132*  CREATININE 1.36* 1.25*  CALCIUM 8.1* 7.7*    PT/INR: No results for input(s): LABPROT, INR in the last 72 hours. ABG    Component Value Date/Time   PHART 7.348 (L) 09/16/2019 1714   HCO3 32.0 (H) 09/16/2019 1714   TCO2 34 (H) 09/16/2019 1714   ACIDBASEDEF 1.0 08/08/2019 1115   O2SAT 72.6 09/19/2019 0350   CBG (last 3)  Recent Labs    09/18/19 2342 09/19/19 0330 09/19/19 0747  GLUCAP 160* 170* 151*    Assessment/Plan: S/P Procedure(s) (LRB): REMOVAL OF IMPELLA LEFT VENTRICULAR ASSIST DEVICE (N/A) TRANSESOPHAGEAL ECHOCARDIOGRAM (TEE) (N/A) - Improvement  over past 48 hours CV- in SR  Co-ox 72 on milrinone 0.25 RESP- on 40%, PEEP down form 12 to 5  Vent per CCM RENAL- creatinine down slightly, I/O negative ENDO- CBG moderately elevated ID- afebrile, WBC 10.2 on meropenem  Cultures growing Stenotrophomonas maltophilia GI- tolerating TF  BIli down slightly, transaminases remain slightly elevated Deconditioning - severe  LOS: 55 days    [name] 09/19/2019

## 2019-09-19 NOTE — Progress Notes (Signed)
SLP Cancellation Note  Patient Details Name: Todd [name] MRN: [id number] DOB: [birth year]   Cancelled treatment:       Reason Eval/Treat Not Completed: Medical issues which prohibited therapy (Pt was on ATC for approximately an hour this morning but placed back on full vent support due to sats at 80% and increased WOB. SLP will follow up for PMSV evaluation.)  Cache Decoursey I. Vear Clock, MS, CCC-SLP Acute Rehabilitation Services Office number [phone] Pager [phone]  Scheryl Marten 09/19/2019, 3:24 PM

## 2019-09-19 NOTE — Progress Notes (Signed)
NAME:  MONTEZ CUDA, MRN:  [id number], DOB:  1955-10-20, LOS: 50 ADMISSION DATE:  07/26/2019, CONSULTATION DATE:  08/03/19 REFERRING MD:  Roxan Hockey, CHIEF COMPLAINT:  ECMO   Brief History   64 yo male admitted with CHF exacerbation, found to have multi-vessel disease requiring CABG 6/24, post op VT Arrest 6/25 and cannulated for VA ECMO.    History of present illness   Presented with worsening dyspnea 6/17 c/w CHF exacerbation.  LHC  with severe triple vessel disease.  Underwent CABG 6/24. Vtach arrest 6/25.  Chest opened bedside and cardiac massage initiated as well as multiple cardioversions, amiodarone, bicarb etc.  Brought to OR and cannulated for VA ECMO.  PCCM consulted to assist with management  Comorbidities include DM, heavy smoking, COPD  Past Medical History  Depression Ischemic cardiomyopathy HTN HLD  Significant Hospital Events   6/24 CABG 6/25 VA cannulation 6/28 decannulated and impella placed 6/29 bedside re-exploration; s/p decannulation. Placement of impella device originally at p8 but decreased to p4 2/2 suction events overnight up to p6. Echo completed and repositioned device. Remained on considerable support with 8 epi and 46 norepi vaso 0.05. continued chest tube output with large clots noted. Heparin thru device. Replacement products ongoing. 60% 8 peep 6/30 bedside mediastinal re-exploration and clean out of hematoma with tamponade and worsening hemodynamics. Pt had large volume transfusion. rebolused with amio 2/2 nsvt episode.  Weight up 48 pounds.  Started diuresis, Lasix drip started. 7/01 iatrogenic respiratory alkalosis, vent rate decreased. 7/02 No significant issues overnight remains on pressors and Impella not tolerating tube feeds with high gastric output awaiting core track placement 7/03 started trickle TF  7/05 Swab removed, CVL and PICC placed. 7/13 Trach and BAL 7/16 Awake and tracking. No follow commands. Still having fevers. Successful  ultrasound and CT guided placement of a 10.2 French cholecystostomy tube. A small amount of aspirated bile was capped and sent to the laboratory for analysis 7/17 Off sedation, PSV.  Beginning to follow some commands occasionally intermittently.  Very jaundiced.  Growing Candida in the blood and BAL > diflucan.   7/21 Ongoing fevers, WBC increased 7/22 Persistent fevers, pan cultured with CT Chest/Head. Suspected source tracheobronchitis.  7/28 Transitioned to eliquis, jaundiced but labs improving  7/29 Agitation/restless.  Trazodone added QHS. Weaning midodrine, attempting diuresis  7/30 Increased Na, weaning on PSV.  8/01 On 40% ATC, significantly improved mental status 8/1>> ongoing attempts at trach weaning complicated by weakness, secretion management, and anxiety 8/8 started back on meropenem and milrinone  Consults:  Advanced heart failure, PCCM, TCTS  Procedures:  LUE PICC 7/5 >>7/21 R Brachial ALine 7/9 >>7/27 Chest Tubes >> L IJ CVC 7/21 >>7/27 Peg tube 7/27 >>  Significant Diagnostic Tests:  6/22 spirometry with restrictive physiology, preserved FEV1/FVC 6/18 echo: LVEF 95-63%, grade I diastolic dysfunction 8/75 US Abdomen cholelithiasis without cholecystitis. Mild wall thinking in setting of liver disease and ascites.  7/10 LE [place] negative for DVT  7/20 CT ABD w contrast >> stable position of the percutaneous cholecystostomy tube with complete decompression of the gallbladder, trace residual free fluid within the RUQ, decreased since prior, trace bilateral pleural effusions L>R, scattered areas of ground glass airspace disease within the RML, RLL 7/22 CT Chest w/o Contrast >> Retrosternal fluid collection, non specific.  Bilateral pleural effusions, portions loculated.   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 Stable bilateral mastoid and middle ear effusions.   Micro  Data:  7/14 blood >>  candida parapsilosis resp 8/7 >> GNR >  Antimicrobials:  Meropenem 8/8 >> Diflucan  7/16 >> vanc 7/22 >>  Interim history/subjective:  Sitting up in bed on ventilator in no acute distress.  Does endorse significant back/sacral pain.  Objective   Blood pressure (!) 119/48, pulse 74, temperature 98.4 F (36.9 C), temperature source Oral, resp. rate 18, height _0  (1.753 m), weight 78.4 kg, SpO2 94 %.    Vent Mode: PSV;CPAP FiO2 (%):  [40 %-60 %] 40 % Set Rate:  [14 bmp] 14 bmp Vt Set:  [420 mL] 420 mL PEEP:  [5 cmH20-8 cmH20] 5 cmH20 Pressure Support:  [5 cmH20-10 cmH20] 5 cmH20 Plateau Pressure:  [20 cmH20] 20 cmH20   Intake/Output Summary (Last 24 hours) at 09/19/2019 0813 Last data filed at 09/19/2019 0700 Gross per 24 hour  Intake 2158.55 ml  Output 3020 ml  Net -861.45 ml   Filed Weights   09/17/19 0500 09/18/19 0600 09/19/19 0305  Weight: 77.3 kg 77.9 kg 78.4 kg   Exam General: Chronically ill appearing slightly jaundiced deconditioned weak elderly gentleman lying in bed in no acute distress HEENT: 8 [place]iley cuffed trach midline no secretions, MM pink/moist, PERRL,  Neuro: Alert and oriented x3 able to mouth communication on ventilator, weak in all extremities CV: s1s2 regular rate and rhythm, no murmur, rubs, or gallops,  PULM: Clear to auscultation bilaterally, mechanical breath sounds, no increased work of breathing, tolerating wean GI: soft, bowel sounds active in all 4 quadrants, non-tender, non-distended, tolerating TF, biliary drain Extremities: warm/dry, no edema  Skin: no rashes or lesions  Lab Results  Component Value Date   WBC 10.2 09/19/2019   HGB 7.4 (L) 09/19/2019   HCT 24.5 (L) 09/19/2019   MCV 97.6 09/19/2019   PLT 201 09/19/2019   Lab Results  Component Value Date   CREATININE 1.25 (H) 09/19/2019   BUN 132 (H) 09/19/2019   NA 137 09/19/2019   K 3.8 09/19/2019   CL 98 09/19/2019   CO2 30 09/19/2019   Resolved Hospital Problem list     Suspected HCAP Acute kidney injury, improved  Hypernatremia, improved  Acute Metabolic Encephalopathy  -CTH without acute intracranial pathology. EEG negative 7/23.  Shock Heparin-induced thrombocytopenia >> resolved, required bivalirudin  Assessment & Plan:   Acute hypoxic respiratory failure w/ need for mechanical ventilation  - status post tracheostomy - Poor lung mechanics due to weakness, deconditioning  8/8: worsening pulmonary edema pattern on CXR but also r/o HCAP, trach aspirate GNR and abundant GNR P: Continue pressure support trials with goal of daily ATC weans Continue aggressive PT/OT Encourage pulmonary hygiene Continue IV meropenem stop 8/10 Continue chest PT We will likely need LTAC placement  Cardiogenic/hemorrhagic shock status post CABG complicated by VT arrest, improved S/p VA ECMO (decanulated 6/28).  S/p impella placement (6/28> 7/9) P: Primary management per heart failure and TCTS Continue midodrine and milrinone Continue low-dose IV diuretics Following strict intake and output  Atrial flutter P: Continue aspirin Continue to observe LFTs continues to rise will need to hold amiodarone Continuous telemetry  Hyperbilirubinemia due to acalculous cholecystitis Possible cirrhosis per 7/8 [place]  Status post  percutaneous GB drain  Jaundice-bilirubin rising P: Drain per IR Trend LFTs and total bili  DM II with fluctuant hypo/hyperglycemia P: CBG every 4 Continue tube feeds Continue twice daily Levemir  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  P: ID assistance appreciated Diflucan finished 8/10  Acute blood loss anemia and critical illness anemia , sp 1 U PRBC 8/5 & 8/9  HIT, resolved  P: Hemoglobin remained stable at 7.4 We will need to consider initiation of Arixtra soon  Acute kidney injury, improved  Hypernatremia, improved  -Trend BMP / urinary output -Replace electrolytes as  indicated -Avoid nephrotoxic agents, ensure adequate renal perfusion - decrease free water 200 q 4h  Anxiety -likely ICU related stress disorder P: Continue Prozac, Seroquel nightly, and Klonopin  Sacral Decubitus  P: Hydrotherapy per PT WOC following Frequent turns Pressure alleviating devices  Deconditioning of Critical Illness  P: Continue PT/OT efforts  Small left adrenal mass seen on CT scan of the abdomen August 24, 2019 - OP f/u  Daily Goals Checklist  Pain/Anxiety/Delirium protocol (if indicated): see above  VAP protocol (if indicated): bundle in place.  DVT prophylaxis: eliquis Nutrition Status: TF  GI prophylaxis: PPI Urinary catheter: n/a Central lines: removed Glucose control: See above Mobility/therapy needs: PT for ROM  Code Status: Full  Family Communication: per primary Disposition: ICU --  will need LTACH  CRITICAL CARE Performed by: Johnsie Cancel  Total critical care time: 38 minutes  Critical care time was exclusive of separately billable procedures and treating other patients.  Critical care was necessary to treat or prevent imminent or life-threatening deterioration.  Critical care was time spent personally by me on the following activities: development of treatment plan with patient and/or surrogate as well as nursing, discussions with consultants, evaluation of patient's response to treatment, examination of patient, obtaining history from patient or surrogate, ordering and performing treatments and interventions, ordering and review of laboratory studies, ordering and review of radiographic studies, pulse oximetry and re-evaluation of patient's condition.  Johnsie Cancel, NP-C [facility] Contact / Pager information can be found on Amion  09/19/2019, 8:58 AM

## 2019-09-19 NOTE — Plan of Care (Signed)
  Problem: Clinical Measurements: Goal: Ability to maintain clinical measurements within normal limits will improve Outcome: Progressing   Problem: Clinical Measurements: Goal: Will remain free from infection Outcome: Progressing   Problem: Clinical Measurements: Goal: Diagnostic test results will improve Outcome: Progressing   Problem: Clinical Measurements: Goal: Respiratory complications will improve Outcome: Progressing   

## 2019-09-19 NOTE — TOC Initial Note (Signed)
Transition of Care [facility]) - Initial/Assessment Note    Patient Details  Name: Todd [name] MRN: [id number] Date of Birth: [birth year]  Transition of Care [facility]) CM/SW Contact:    Todd [name], LCSWA Phone Number: 09/19/2019, 8:52 AM  Clinical Narrative:                  CSW faxed over initial [facility] for review for possible SNF placement. CSW awaiting call back from [facility].  Pending bed offers. CSW will continue to follow.  Expected Discharge Plan: Long Term Acute Care (LTAC) Barriers to Discharge: Insurance Authorization   Patient Goals and CMS Choice Patient states their goals for this hospitalization and ongoing recovery are:: Patient's wife would like patient to transfer to [facility] CMS Medicare.gov Compare Post Acute Care list provided to:: Patient Represenative (must comment) (Patient's spouse - Todd [name]) Choice offered to / list presented to : Spouse  Expected Discharge Plan and Services Expected Discharge Plan: Long Term Acute Care (LTAC)   Discharge Planning Services: CM Consult Post Acute Care Choice: Long Term Acute Care (LTAC) Living arrangements for the past 2 months: Single Family Home                                      Prior Living Arrangements/Services Living arrangements for the past 2 months: Single Family Home Lives with:: Spouse Patient language and need for interpreter reviewed:: Yes Do you feel safe going back to the place where you live?: Yes      Need for Family Participation in Patient Care: Yes (Comment) Care giver support system in place?: Yes (comment)   Criminal Activity/Legal Involvement Pertinent to Current Situation/Hospitalization: No - Comment as needed  Activities of Daily Living Home Assistive Devices/Equipment: Cane (specify quad or straight) ADL Screening (condition at time of admission) Patient's cognitive ability adequate to safely complete daily activities?: Yes Is the  patient deaf or have difficulty hearing?: Yes Does the patient have difficulty seeing, even when wearing glasses/contacts?: No Does the patient have difficulty concentrating, remembering, or making decisions?: Yes Patient able to express need for assistance with ADLs?: Yes Does the patient have difficulty dressing or bathing?: No Independently performs ADLs?: Yes (appropriate for developmental age) Does the patient have difficulty walking or climbing stairs?: No Weakness of Legs: None Weakness of Arms/Hands: None  Permission Sought/Granted Permission sought to share information with : Case Manager Permission granted to share information with : Yes, Verbal Permission Granted     Permission granted to share info w AGENCY: Todd [name], RNCM with [facility]  Permission granted to share info w Relationship: Spouse - Todd [name]     Emotional Assessment Appearance:: Appears stated age Attitude/Demeanor/Rapport: Gracious     Alcohol / Substance Use: Not Applicable Psych Involvement: No (comment)  Admission diagnosis:  CHF (congestive heart failure) (HCC) [I50.9] Acute on chronic congestive heart failure, unspecified heart failure type (HCC) [I50.9] Coronary artery disease [I25.10] Patient Active Problem List   Diagnosis Date Noted  . Status post tracheostomy (HCC)   . Loculated pleural effusion   . FUO (fever of unknown origin)   . Acalculous cholecystitis 08/27/2019  . ARDS (adult respiratory distress syndrome) (HCC)   . Cardiogenic shock (HCC)   . Candidemia (HCC) 08/25/2019  . 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 Acute on chronic respiratory failure (HCC)   . Acute respiratory failure (  HCC)   . Pressure injury of skin 08/12/2019  . Malnutrition of moderate degree 08/10/2019  . Coronary artery disease 08/02/2019  . Coronary artery disease involving native coronary artery of native heart with unstable angina pectoris (HCC)   . Type 2 diabetes mellitus with hyperglycemia  (HCC) 07/29/2019  . AKI (acute kidney injury) (HCC)   . CHF (congestive heart failure) (HCC) 07/26/2019  . Benign prostatic hyperplasia with lower urinary tract symptoms 07/26/2019  . Chronic pain syndrome 07/26/2019  . Essential hypertension 07/26/2019  . Elective surgery 07/26/2019  . Mixed hyperlipidemia 07/26/2019  . Moderate recurrent major depression (HCC) 07/26/2019  . Hypothyroidism 07/26/2019  . Acute systolic heart failure (HCC) 07/26/2019   PCP:  Todd Proffer, MD Pharmacy:   [facility] [phone] - [place], [place] - [phone] AT [facility] [address] [facility] [address] & [address] [place] [address] Phone: [phone] Fax: [phone]     Social Determinants of Health (SDOH) Interventions    Readmission Risk Interventions Readmission Risk Prevention Plan 09/03/2019  Transportation Screening Complete  PCP or Specialist Appt within 3-5 Days Complete  HRI or Home Care Consult Complete  Social Work Consult for Recovery Care Planning/Counseling Complete  Palliative Care Screening Complete  Medication Review Oceanographer) Complete  Some recent data might be hidden

## 2019-09-19 NOTE — Progress Notes (Signed)
Patient ID: Todd [name], male   DOB: 08-Aug-1955, 64 y.o.   MRN: [id number] EVENING ROUNDS NOTE :     [address]       [place] [address]             [phone]                 33 Days Post-Op Procedure(s) (LRB): REMOVAL OF IMPELLA LEFT VENTRICULAR ASSIST DEVICE (N/A) TRANSESOPHAGEAL ECHOCARDIOGRAM (TEE) (N/A)  Total Length of Stay:  LOS: 55 days  BP (!) 123/49   Pulse 77   Temp 99.1 F (37.3 C) (Oral)   Resp (!) 23   Ht 5\' 9"  (1.753 m)   Wt 78.4 kg   SpO2 98%   BMI 25.52 kg/m   .Intake/Output      08/10 0701 - 08/11 0700 08/11 0701 - 08/12 0700   P.O. 0 200   I.V. (mL/kg) 369.2 (4.7) 111.8 (1.4)   Blood     Other 45    NG/GT 1680 960   IV Piggyback 150 132.5   Total Intake(mL/kg) 2244.2 (28.6) 1404.2 (17.9)   Urine (mL/kg/hr) 2465 (1.3) 650 (0.9)   Drains 355 50   Stool 200    Total Output 3020 700   Net -775.8 +704.2          . sodium chloride Stopped (09/19/19 1340)  . feeding supplement (PIVOT 1.5 CAL) 70 mL/hr at 09/19/19 1500  . levofloxacin (LEVAQUIN) IV 100 mL/hr at 09/19/19 1500  . milrinone 0.25 mcg/kg/min (09/19/19 1500)     Lab Results  Component Value Date   WBC 10.2 09/19/2019   HGB 7.4 (L) 09/19/2019   HCT 24.5 (L) 09/19/2019   PLT 201 09/19/2019   GLUCOSE 175 (H) 09/19/2019   CHOL 203 (H) 07/27/2019   TRIG 213 (H) 08/27/2019   HDL 34 (L) 07/27/2019   LDLCALC 148 (H) 07/27/2019   ALT 114 (H) 09/19/2019   AST 124 (H) 09/19/2019   NA 137 09/19/2019   K 3.8 09/19/2019   CL 98 09/19/2019   CREATININE 1.25 (H) 09/19/2019   BUN 132 (H) 09/19/2019   CO2 30 09/19/2019   TSH 2.243 07/31/2019   INR 1.4 (H) 09/04/2019   HGBA1C 9.0 (H) 07/26/2019   Alert On trach collar hour today   07/28/2019 MD  Beeper [phone] Office [phone] 09/19/2019 4:03 PM

## 2019-09-19 NOTE — Progress Notes (Signed)
PT Cancellation Note  Patient Details Name: Todd [name] MRN: [id number] DOB: 1955-12-02   Cancelled Treatment:    Reason Eval/Treat Not Completed: (P) Pain limiting ability to participate;Patient declined, no reason specified. Pt reports increased pain today especially at the site of his wound and request hydrotherapy be deferred today. PT will follow back for hydrotherapy tomorrow.   Jaydynn Wolford B. Beverely Risen PT, DPT Acute Rehabilitation Services Pager [phone] Office [phone]    Elon Alas [facility] 09/19/2019, 2:46 PM

## 2019-09-19 NOTE — Progress Notes (Signed)
RT NOTE: Placed pt on full vent support from ATC due to sats at 80% and increased WOB. RN at bedside.

## 2019-09-20 ENCOUNTER — Inpatient Hospital Stay (HOSPITAL_COMMUNITY): Payer: Medicare Other

## 2019-09-20 DIAGNOSIS — B377 Candidal sepsis: Secondary | ICD-10-CM | POA: Diagnosis not present

## 2019-09-20 DIAGNOSIS — Z930 Tracheostomy status: Secondary | ICD-10-CM | POA: Diagnosis not present

## 2019-09-20 DIAGNOSIS — Y95 Nosocomial condition: Secondary | ICD-10-CM

## 2019-09-20 DIAGNOSIS — J189 Pneumonia, unspecified organism: Secondary | ICD-10-CM

## 2019-09-20 DIAGNOSIS — J9621 Acute and chronic respiratory failure with hypoxia: Secondary | ICD-10-CM | POA: Diagnosis not present

## 2019-09-20 LAB — COMPREHENSIVE METABOLIC PANEL
ALT: 128 U/L — ABNORMAL HIGH (ref 0–44)
AST: 147 U/L — ABNORMAL HIGH (ref 15–41)
Albumin: 1.5 g/dL — ABNORMAL LOW (ref 3.5–5.0)
Alkaline Phosphatase: 809 U/L — ABNORMAL HIGH (ref 38–126)
Anion gap: 10 (ref 5–15)
BUN: 147 mg/dL — ABNORMAL HIGH (ref 8–23)
CO2: 31 mmol/L (ref 22–32)
Calcium: 8.1 mg/dL — ABNORMAL LOW (ref 8.9–10.3)
Chloride: 94 mmol/L — ABNORMAL LOW (ref 98–111)
Creatinine, Ser: 1.29 mg/dL — ABNORMAL HIGH (ref 0.61–1.24)
GFR calc Af Amer: >60 mL/min (ref >60)
GFR calc non Af Amer: 58 mL/min — ABNORMAL LOW (ref >60)
Glucose, Bld: 172 mg/dL — ABNORMAL HIGH (ref 70–99)
Potassium: 4.3 mmol/L (ref 3.5–5.1)
Sodium: 135 mmol/L (ref 135–145)
Total Bilirubin: 6.4 mg/dL — ABNORMAL HIGH (ref 0.3–1.2)
Total Protein: 5.5 g/dL — ABNORMAL LOW (ref 6.5–8.1)

## 2019-09-20 LAB — GLUCOSE, CAPILLARY
Glucose-Capillary: 153 mg/dL — ABNORMAL HIGH (ref 70–99)
Glucose-Capillary: 147 mg/dL — ABNORMAL HIGH (ref 70–99)
Glucose-Capillary: 164 mg/dL — ABNORMAL HIGH (ref 70–99)
Glucose-Capillary: 194 mg/dL — ABNORMAL HIGH (ref 70–99)
Glucose-Capillary: 159 mg/dL — ABNORMAL HIGH (ref 70–99)
Glucose-Capillary: 192 mg/dL — ABNORMAL HIGH (ref 70–99)

## 2019-09-20 LAB — CBC
HCT: 25.1 % — ABNORMAL LOW (ref 39.0–52.0)
Hemoglobin: 7.5 g/dL — ABNORMAL LOW (ref 13.0–17.0)
MCH: 29.5 pg (ref 26.0–34.0)
MCHC: 29.9 g/dL — ABNORMAL LOW (ref 30.0–36.0)
MCV: 98.8 fL (ref 80.0–100.0)
Platelets: 219 10*3/uL (ref 150–400)
RBC: 2.54 MIL/uL — ABNORMAL LOW (ref 4.22–5.81)
RDW: 17.6 % — ABNORMAL HIGH (ref 11.5–15.5)
WBC: 11.6 10*3/uL — ABNORMAL HIGH (ref 4.0–10.5)
nRBC: 0 % (ref 0.0–0.2)

## 2019-09-20 LAB — MAGNESIUM: Magnesium: 3 mg/dL — ABNORMAL HIGH (ref 1.7–2.4)

## 2019-09-20 LAB — COOXEMETRY PANEL
Carboxyhemoglobin: 1.9 % — ABNORMAL HIGH (ref 0.5–1.5)
Methemoglobin: 0.9 % (ref 0.0–1.5)
O2 Saturation: 69.7 %
Total hemoglobin: 10 g/dL — ABNORMAL LOW (ref 12.0–16.0)

## 2019-09-20 LAB — PHOSPHORUS: Phosphorus: 5 mg/dL — ABNORMAL HIGH (ref 2.5–4.6)

## 2019-09-20 MED ORDER — CHLORHEXIDINE GLUCONATE 0.12 % MT SOLN
OROMUCOSAL | Status: AC
  Filled 2019-09-20: qty 15

## 2019-09-20 MED ORDER — MILRINONE LACTATE IN DEXTROSE 20-5 MG/100ML-% IV SOLN: 0.1250 ug/kg/min | INTRAVENOUS | Status: DC

## 2019-09-20 MED ORDER — FREE WATER
200.0000 mL | Freq: Four times a day (QID) | Status: DC
  Administered 2019-09-20 – 2019-09-22 (×7): 200 mL

## 2019-09-20 NOTE — Progress Notes (Signed)
Physical Therapy Wound Treatment Patient Details  Name: Todd [name] MRN: [id number] Date of Birth: 04-Jul-1955  Today's Date: 09/20/2019 Time: 8546-2703 Time Calculation (min): 30 min  Subjective  Subjective: pt refused therapy yesterday and requires convincing today agrees after increased pain medication  Patient and Family Stated Goals: unable   Pain Score: Pt requested IV pain medication prior to treatment, and although nearly asleep during treatment exhibits 4/10 facial pain grimace with pulsed lavage.   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 has evolved into stage 4 when assessed on 8/9 (Active)  Wound Image   09/20/19 1400  Dressing Type ABD;Gauze (Comment);Moist to moist;Barrier Film (skin prep);Non adherent 09/20/19 1400  Dressing Changed;Clean;Dry;Intact 09/20/19 1400  Dressing Change Frequency Daily 09/20/19 1400  State of Healing Early/partial granulation 09/20/19 1400  Site / Wound Assessment Painful;Pink;Dusky;Friable;Yellow;Brown;Bleeding 09/20/19 1400  % Wound base Red or Granulating 60% 09/20/19 1400  % Wound base Yellow/Fibrinous Exudate 35% 09/20/19 1400  % Wound base Black/Eschar 0% 09/20/19 1400  % Wound base Other/Granulation Tissue (Comment) 5% 09/20/19 1400  Peri-wound Assessment Denuded;Bleeding;Erythema (blanchable);Maceration 09/20/19 1400  Wound Length (cm) 6.1 cm 09/20/19 1400  Wound Width (cm) 5.1 cm 09/20/19 1400  Wound Depth (cm) 2 cm 09/20/19 1400  Wound Surface Area (cm^2) 31.11 cm^2 09/20/19 1400  Wound Volume (cm^3) 62.22 cm^3 09/20/19 1400  Undermining (cm) 1.1-1.6 cm around entire wound  09/20/19 1400  Margins Unattached edges (unapproximated) 09/20/19 1400  Drainage Amount None 09/20/19 1400  Drainage Description Serosanguineous 09/20/19 1400  Treatment Debridement (Selective);Hydrotherapy (Pulse lavage);Packing (Saline gauze) 09/20/19 1400   Santyl applied to  necrotic tissue    Hydrotherapy Pulsed lavage therapy - wound location: sacrum Pulsed Lavage with Suction (psi): 12 psi Pulsed Lavage with Suction - Normal Saline Used: 1000 mL Pulsed Lavage Tip: Tip with splash shield Selective Debridement Selective Debridement - Location: sacrum Selective Debridement - Tools Used: Forceps;Scissors Selective Debridement - Tissue Removed: yellow/gray slough   Wound Assessment and Plan  Wound Therapy - Assess/Plan/Recommendations Wound Therapy - Clinical Statement: Pt awake on entry having just worked with OT. Initially pt refusing hydrotherapy, however with education agreeable after pain medication. Pt sleepy by time of treatment. Pt's wound continues to have yellow stringy slough across top of sacral bone, walls of wound with granulation tissue however inferior wall of wound with more brown necrotic tissue today. Continue to debride hardened eschar from edge of wound and brown red necrotic tissue along inferior wall of wound. Pt can benefit from continued hydrotherapy and selective debridement of necrotic tissue to decrease bioburden, however if continues to have increased pain may need to change to more passive wound treatment.  Wound Therapy - Functional Problem List: decreased mobility Factors Delaying/Impairing Wound Healing: Diabetes Mellitus;Incontinence;Immobility;Multiple medical problems Hydrotherapy Plan: Debridement;Dressing change;Patient/family education;Pulsatile lavage with suction Wound Therapy - Frequency: 6X / week Wound Therapy - Follow Up Recommendations: Other (comment) (LTACH) Wound Plan: see above  Wound Therapy Goals- Improve the function of patient's integumentary system by progressing the wound(s) through the phases of wound healing (inflammation - proliferation - remodeling) by: Decrease Necrotic Tissue to: 20 Decrease Necrotic Tissue - Progress: Not progressing Increase Granulation Tissue to: 80 Increase Granulation Tissue -  Progress: Mot progressing Time For Goal Achievement: 7 days Wound Therapy - Potential for Goals: Fair  Goals will be updated until maximal potential achieved or discharge criteria met.  Discharge criteria: when goals achieved, discharge from hospital,  MD decision/surgical intervention, no progress towards goals, refusal/missing three consecutive treatments without notification or medical reason.  GP    Dani Gobble. Migdalia Dk PT, DPT Acute Rehabilitation Services Pager [phone] Office [phone]  [place] 09/20/2019, 2:19 PM

## 2019-09-20 NOTE — Progress Notes (Signed)
      [address]       [place] [address]             [phone]      Alert Weaned on vent most of day About 90 minutes on TC Sacral decubitus has worsened per PT  Viviann Spare C. Dorris Fetch, MD [facility] [phone]

## 2019-09-20 NOTE — Progress Notes (Signed)
09/20/19 1031  OT Visit Information  Last OT Received On 09/20/19  Assistance Needed +2  History of Present Illness Todd [name] is a 64 y.o. male presenting with shortness of breath; noted AKI, CHF, recent PNA, elevated troponin, pulm edema/effusion, and cardiomegaly.Marland Kitchen CABG 6/24 and intubated (extubated 6/25)--went into cardiac arrest on same date with re-opening of chest for heart massage and direct epicardial paddles and place on ECMO. 08/03/19 washout out for tamponade. 7/2 sternal closure and wound vac. 7/9 Impella removed.08/20/19 Tracheostomy 08/24/19 cholecystostomy tube. 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 PMHx: HFrEF, COPD, HTN, T2DM, HLD, MDD, chronic back pain, vitamin deficiency, BPH, tobacco use disorder, marijuana use disorder  Precautions  Precautions Fall;Sternal  Required Braces or Orthoses Other Brace  Other Brace B Prevalon  Pain Assessment  Pain Assessment Faces  Faces Pain Scale 4  Pain Location LUE with ROM  Pain Descriptors / Indicators Grimacing;Guarding  Pain Intervention(s) Repositioned  Cognition  Arousal/Alertness Awake/alert  Behavior During Therapy Flat affect  Overall Cognitive Status Difficult to assess  Area of Impairment Following commands  Following Commands Follows one step commands with increased time  Difficult to assess due to Tracheostomy;Hard of hearing/deaf  Upper Extremity Assessment  Upper Extremity Assessment LUE deficits/detail  LUE Deficits / Details No AROM noted in LUE. DId note increase tone v. guarding due to pain, v. combination of the two. I was able to get his elbow flexed fully with increased time and gentle pressure, only able to get his shoulder to approximately 45 degrees (noted limited scapular movement as well. Forearm distally able to do full PROM   ADL  Overall ADL's  Needs assistance/impaired  General ADL Comments total a for all basic ADLs  Exercises  Exercises General Upper Extremity   Other Exercises  Other Exercises Pt able to participate in AAROM 10 reps supine AAROM composite finger flexion/extension, wrist flexion/extension, forearm supination/pronation, elbow flexion/extension, and shoulder flexion/extension. (varied 1-2/5). Pt spontanesously moving his RUE as well.  OT - End of Session  Equipment Utilized During Treatment Oxygen  Activity Tolerance Patient limited by pain  Patient left in bed;with call bell/phone within reach  Nurse Communication Other (comment) (LUE tight for elbow flexion and shoulder flexion)  OT Assessment/Plan  OT Plan Discharge plan remains appropriate  OT Visit Diagnosis Other abnormalities of gait and mobility (R26.89);Muscle weakness (generalized) (M62.81);Other symptoms and signs involving cognitive function;Pain  Pain - Right/Left Left  Pain - part of body Shoulder;Arm  OT Frequency (ACUTE ONLY) Min 2X/week  Follow Up Recommendations LTACH;Supervision/Assistance - 24 hour  OT Equipment Other (comment) (TBD next venue)  AM-PAC OT "6 Clicks" Daily Activity Outcome Measure (Version 2)  Help from another person eating meals? 1  Help from another person taking care of personal grooming? 1  Help from another person toileting, which includes using toliet, bedpan, or urinal? 1  Help from another person bathing (including washing, rinsing, drying)? 1  Help from another person to put on and taking off regular upper body clothing? 1  Help from another person to put on and taking off regular lower body clothing? 1  6 Click Score 6  OT Goal Progression  Progress towards OT goals Not progressing toward goals - comment (LUE is more tight/stiff/tonal than on previous sessions)  Acute Rehab OT Goals  Patient Stated Goal pt unable to state  OT Goal Formulation Patient unable to participate in goal setting  Time For Goal Achievement 10/02/19  Potential to Achieve  Goals Fair  OT Time Calculation  OT Start Time (ACUTE ONLY) 1014  OT Stop Time  (ACUTE ONLY) 1029  OT Time Calculation (min) 15 min  OT General Charges  $OT Visit 1 Visit  OT Treatments  $Therapeutic Exercise 8-22 mins    Ignacia Palma, OTR/L Acute [employer] Pager [phone] Office [phone]

## 2019-09-20 NOTE — Plan of Care (Signed)

## 2019-09-20 NOTE — Progress Notes (Signed)
34 Days Post-Op Procedure(s) (LRB): REMOVAL OF IMPELLA LEFT VENTRICULAR ASSIST DEVICE (N/A) TRANSESOPHAGEAL ECHOCARDIOGRAM (TEE) (N/A) Subjective: Awake and cooperative  Objective: Vital signs in last 24 hours: Temp:  [97.9 F (36.6 C)-99.1 F (37.3 C)] 97.9 F (36.6 C) (08/12 0724) Pulse Rate:  [72-81] 75 (08/12 0745) Cardiac Rhythm: Normal sinus rhythm (08/12 0400) Resp:  [10-27] 16 (08/12 0745) BP: (98-127)/(44-69) 123/52 (08/12 0700) SpO2:  [92 %-100 %] 96 % (08/12 0745) FiO2 (%):  [40 %-60 %] 40 % (08/12 0745) Weight:  [79.4 kg] 79.4 kg (08/12 0500)  Hemodynamic parameters for last 24 hours:    Intake/Output from previous day: 08/11 0701 - 08/12 0700 In: 2704.1 [P.O.:200; I.V.:344; NG/GT:2010; IV Piggyback:150.1] Out: 1875 [Urine:1700; Drains:175] Intake/Output this shift: No intake/output data recorded.  General appearance: alert, cooperative and no distress Neurologic: intact Heart: regular rate and rhythm Lungs: clear to auscultation bilaterally and anteriorly Abdomen: perc drain in place, nontender Wound: clean and dry decreased serous drainage from CT site  Lab Results: Recent Labs    09/19/19 0321 09/20/19 0433  WBC 10.2 11.6*  HGB 7.4* 7.5*  HCT 24.5* 25.1*  PLT 201 219   BMET:  Recent Labs    09/19/19 0321 09/20/19 0433  NA 137 135  K 3.8 4.3  CL 98 94*  CO2 30 31  GLUCOSE 175* 172*  BUN 132* 147*  CREATININE 1.25* 1.29*  CALCIUM 7.7* 8.1*    PT/INR: No results for input(s): LABPROT, INR in the last 72 hours. ABG    Component Value Date/Time   PHART 7.348 (L) 09/16/2019 1714   HCO3 32.0 (H) 09/16/2019 1714   TCO2 34 (H) 09/16/2019 1714   ACIDBASEDEF 1.0 08/08/2019 1115   O2SAT 69.7 09/20/2019 0433   CBG (last 3)  Recent Labs    09/19/19 2357 09/20/19 0431 09/20/19 0721  GLUCAP 149* 153* 147*    Assessment/Plan: S/P Procedure(s) (LRB): REMOVAL OF IMPELLA LEFT VENTRICULAR ASSIST DEVICE (N/A) TRANSESOPHAGEAL ECHOCARDIOGRAM  (TEE) (N/A) -NEURO- intact CV- in SR, co-ox 69 on milrinone RESP- VDRF, CXR not significantly changed, bilateral AS disease/ pulmonary edema  On 40% and 5 PEEP- vent per CCM RENAL- creatinine stable, I/O slightly positive ENDO- CBG OK ID- afebrile on meropenem GI- Bili stable, alk phos up despite perc chole drain Nutrition- continue TF Deconditioning- PT/OT Sacral decub- hydrotherapy   LOS: 56 days    Melrose Nakayama 09/20/2019

## 2019-09-20 NOTE — Progress Notes (Signed)
NAME:  Todd [name], MRN:  [id number], DOB:  July 29, 1955, LOS: 69 ADMISSION DATE:  07/26/2019, CONSULTATION DATE:  08/03/19 REFERRING MD:  Roxan Hockey, CHIEF COMPLAINT:  ECMO   Brief History   64 yo male admitted with CHF exacerbation, found to have multi-vessel disease requiring CABG 6/24, post op VT Arrest 6/25 and cannulated for VA ECMO.    History of present illness   Presented with worsening dyspnea 6/17 c/w CHF exacerbation.  LHC  with severe triple vessel disease.  Underwent CABG 6/24. Vtach arrest 6/25.  Chest opened bedside and cardiac massage initiated as well as multiple cardioversions, amiodarone, bicarb etc.  Brought to OR and cannulated for VA ECMO.  PCCM consulted to assist with management  Comorbidities include DM, heavy smoking, COPD  Past Medical History  Depression Ischemic cardiomyopathy HTN HLD  Significant Hospital Events   6/24 CABG 6/25 VA cannulation 6/28 decannulated and impella placed 6/29 bedside re-exploration; s/p decannulation. Placement of impella device originally at p8 but decreased to p4 2/2 suction events overnight up to p6. Echo completed and repositioned device. Remained on considerable support with 8 epi and 46 norepi vaso 0.05. continued chest tube output with large clots noted. Heparin thru device. Replacement products ongoing. 60% 8 peep 6/30 bedside mediastinal re-exploration and clean out of hematoma with tamponade and worsening hemodynamics. Pt had large volume transfusion. rebolused with amio 2/2 nsvt episode.  Weight up 48 pounds.  Started diuresis, Lasix drip started. 7/01 iatrogenic respiratory alkalosis, vent rate decreased. 7/02 No significant issues overnight remains on pressors and Impella not tolerating tube feeds with high gastric output awaiting core track placement 7/03 started trickle TF  7/05 Swab removed, CVL and PICC placed. 7/13 Trach and BAL 7/16 Awake and tracking. No follow commands. Still having fevers. Successful  ultrasound and CT guided placement of a 10.2 French cholecystostomy tube. A small amount of aspirated bile was capped and sent to the laboratory for analysis 7/17 Off sedation, PSV.  Beginning to follow some commands occasionally intermittently.  Very jaundiced.  Growing Candida in the blood and BAL > diflucan.   7/21 Ongoing fevers, WBC increased 7/22 Persistent fevers, pan cultured with CT Chest/Head. Suspected source tracheobronchitis.  7/28 Transitioned to eliquis, jaundiced but labs improving  7/29 Agitation/restless.  Trazodone added QHS. Weaning midodrine, attempting diuresis  7/30 Increased Na, weaning on PSV.  8/01 On 40% ATC, significantly improved mental status 8/1>> ongoing attempts at trach weaning complicated by weakness, secretion management, and anxiety 8/8 started back on meropenem and milrinone 8/12 patient making slow improvements, he has been able to tolerate short course ATC   Consults:  Advanced heart failure, PCCM, TCTS  Procedures:  LUE PICC 7/5 >>7/21 R Brachial ALine 7/9 >>7/27 Chest Tubes >> L IJ CVC 7/21 >>7/27 Peg tube 7/27 >>  Significant Diagnostic Tests:  6/22 spirometry with restrictive physiology, preserved FEV1/FVC 6/18 echo: LVEF 22-97%, grade I diastolic dysfunction 9/89 US Abdomen cholelithiasis without cholecystitis. Mild wall thinking in setting of liver disease and ascites.  7/10 LE [place] negative for DVT  7/20 CT ABD w contrast >> stable position of the percutaneous cholecystostomy tube with complete decompression of the gallbladder, trace residual free fluid within the RUQ, decreased since prior, trace bilateral pleural effusions L>R, scattered areas of ground glass airspace disease within the RML, RLL 7/22 CT Chest w/o Contrast >> Retrosternal fluid collection, non specific.  Bilateral pleural effusions, portions loculated.   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  Stable bilateral mastoid and middle ear effusions.   Micro Data:  7/14 blood >> candida parapsilosis resp 8/7 >> GNR >  Antimicrobials:  Meropenem 8/8 >> Diflucan  7/16 >> vanc 7/22 >>  Interim history/subjective:  Lying in bed in no acute distress currently on pressure support.  Is requesting something to drink stating he is "thirsty"  Objective   Blood pressure (!) 123/52, pulse 75, temperature 97.9 F (36.6 C), resp. rate 16, height _0  (1.753 m), weight 79.4 kg, SpO2 96 %.    Vent Mode: PSV;CPAP FiO2 (%):  [40 %-60 %] 40 % Set Rate:  [14 bmp] 14 bmp Vt Set:  [420 mL] 420 mL PEEP:  [8 cmH20] 8 cmH20 Plateau Pressure:  [14 cmH20-21 cmH20] 21 cmH20   Intake/Output Summary (Last 24 hours) at 09/20/2019 0816 Last data filed at 09/20/2019 0700 Gross per 24 hour  Intake 2358.42 ml  Output 1575 ml  Net 783.42 ml   Filed Weights   09/18/19 0600 09/19/19 0305 09/20/19 0500  Weight: 77.9 kg 78.4 kg 79.4 kg   Exam General: Acute on chronic ill-appearing elderly gentleman severely deconditioned lying in bed on mechanical ventilator acute distress HEENT: [place]/AT, MM pink/moist, PERRL, sclera slightly icteric Neuro: Alert and oriented x1/2, short-term memory appears, severely deconditioned with generalized weakness CV: s1s2 regular rate and rhythm, no murmur, rubs, or gallops,  PULM: Mechanical breath sounds bilaterally, no increased work of breathing, tolerating SBT currently GI: soft, bowel sounds active in all 4 quadrants, non-tender, non-distended, tolerating TF Extremities: warm/dry, no edema  Skin: no rashes or lesions   Lab Results  Component Value Date   WBC 11.6 (H) 09/20/2019   HGB 7.5 (L) 09/20/2019   HCT 25.1 (L) 09/20/2019   MCV 98.8 09/20/2019   PLT 219 09/20/2019   Lab Results  Component Value Date   CREATININE 1.29 (H) 09/20/2019   BUN 147 (H) 09/20/2019   NA 135 09/20/2019   K 4.3 09/20/2019   CL 94 (L) 09/20/2019   CO2 31 09/20/2019   Resolved  Hospital Problem list   Suspected HCAP Acute kidney injury, improved  Hypernatremia, improved  Acute Metabolic Encephalopathy  -CTH without acute intracranial pathology. EEG negative 7/23.  Shock Heparin-induced thrombocytopenia >> resolved, required bivalirudin  Assessment & Plan:   Acute hypoxic respiratory failure w/ need for mechanical ventilation  - status post tracheostomy - Poor lung mechanics due to weakness, deconditioning  8/8: worsening pulmonary edema pattern on CXR but also r/o HCAP, trach aspirate GNR and abundant GNR P: Patient is tolerating daily short-term ATC trials Continue to wean as frequent as possible Routine trach care Aggressive PT/OT therapies Encourage frequent pulmonary hygiene Antibiotics stopped 8/10 Continue chest PT Plan of care includes LTAC discharge, this is being pursued  Cardiogenic/hemorrhagic shock status post CABG complicated by VT arrest, improved S/p VA ECMO (decanulated 6/28).  S/p impella placement (6/28> 7/9) P: Primary management per heart failure and TCTS Midodrine and milrinone per heart failure Continue low-dose IV diuretics Follow strict intake and output  Atrial flutter P: Continue aspirin Continue to follow LFTs closely For now continue amiodarone if LFTs worsen may need to consider discontinuation Continuous telemetry  Hyperbilirubinemia due to acalculous cholecystitis Possible cirrhosis per 7/8 [place]  Status post  percutaneous GB drain  Jaundice-bilirubin rising P: Drain per IR Trend LFTs and total bili  DM II with fluctuant hypo/hyperglycemia P: CBGs every 4 Continue tube feeds Continue Levemir  Candida parapsilosis fungemia,  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  P: ID evaluated during admission Antibiotics and antifungal stopped 8/10  Acute blood loss anemia and critical illness anemia , sp 1 U PRBC 8/5 & 8/9  HIT, resolved  P: Hemoglobin remained stable at 7.5 We will defer  initiation of anticoagulant including Arixtra to primary team  Acute kidney injury, improved  Hypernatremia, improved  P: Trend to be met/urine output Supplement electrolytes as needed Avoid nephrotoxins Ensure renal perfusion Continue free water  Anxiety -likely ICU related stress disorder P: Currently appears well controlled Continue Prozac, Seroquel nightly, and Klonopin  Sacral Decubitus  P: Hydrotherapy per PT WIC following Frequent turns Pressure alleviating devices Ensure adequate pain control  Deconditioning of Critical Illness  P: Continue PT/OT efforts  Small left adrenal mass seen on CT scan of the abdomen August 24, 2019 Patient need to follow-up in the outpatient setting  Daily Goals Checklist  Pain/Anxiety/Delirium protocol (if indicated): see above  VAP protocol (if indicated): bundle in place.  DVT prophylaxis: eliquis Nutrition Status: TF  GI prophylaxis: PPI Urinary catheter: n/a Central lines: removed Glucose control: See above Mobility/therapy needs: PT for ROM  Code Status: Full  Family Communication: per primary Disposition: ICU --  will need LTACH  CRITICAL CARE Performed by: Johnsie Cancel  Total critical care time: 37 minutes  Critical care time was exclusive of separately billable procedures and treating other patients.  Critical care was necessary to treat or prevent imminent or life-threatening deterioration.  Critical care was time spent personally by me on the following activities: development of treatment plan with patient and/or surrogate as well as nursing, discussions with consultants, evaluation of patient's response to treatment, examination of patient, obtaining history from patient or surrogate, ordering and performing treatments and interventions, ordering and review of laboratory studies, ordering and review of radiographic studies, pulse oximetry and re-evaluation of patient's condition.  Johnsie Cancel, NP-C [facility] Contact / Pager information can be found on Amion  09/20/2019, 8:16 AM

## 2019-09-21 DIAGNOSIS — Z930 Tracheostomy status: Secondary | ICD-10-CM | POA: Diagnosis not present

## 2019-09-21 DIAGNOSIS — B377 Candidal sepsis: Secondary | ICD-10-CM | POA: Diagnosis not present

## 2019-09-21 DIAGNOSIS — J9621 Acute and chronic respiratory failure with hypoxia: Secondary | ICD-10-CM | POA: Diagnosis not present

## 2019-09-21 LAB — CBC
HCT: 25.1 % — ABNORMAL LOW (ref 39.0–52.0)
Hemoglobin: 7.2 g/dL — ABNORMAL LOW (ref 13.0–17.0)
MCH: 28.2 pg (ref 26.0–34.0)
MCHC: 28.7 g/dL — ABNORMAL LOW (ref 30.0–36.0)
MCV: 98.4 fL (ref 80.0–100.0)
Platelets: 224 10*3/uL (ref 150–400)
RBC: 2.55 MIL/uL — ABNORMAL LOW (ref 4.22–5.81)
RDW: 17.5 % — ABNORMAL HIGH (ref 11.5–15.5)
WBC: 13.1 10*3/uL — ABNORMAL HIGH (ref 4.0–10.5)
nRBC: 0 % (ref 0.0–0.2)

## 2019-09-21 LAB — COMPREHENSIVE METABOLIC PANEL
ALT: 121 U/L — ABNORMAL HIGH (ref 0–44)
AST: 121 U/L — ABNORMAL HIGH (ref 15–41)
Albumin: 1.5 g/dL — ABNORMAL LOW (ref 3.5–5.0)
Alkaline Phosphatase: 801 U/L — ABNORMAL HIGH (ref 38–126)
Anion gap: 9 (ref 5–15)
BUN: 156 mg/dL — ABNORMAL HIGH (ref 8–23)
CO2: 33 mmol/L — ABNORMAL HIGH (ref 22–32)
Calcium: 8.2 mg/dL — ABNORMAL LOW (ref 8.9–10.3)
Chloride: 95 mmol/L — ABNORMAL LOW (ref 98–111)
Creatinine, Ser: 1.31 mg/dL — ABNORMAL HIGH (ref 0.61–1.24)
GFR calc Af Amer: >60 mL/min (ref >60)
GFR calc non Af Amer: 57 mL/min — ABNORMAL LOW (ref >60)
Glucose, Bld: 226 mg/dL — ABNORMAL HIGH (ref 70–99)
Potassium: 4.8 mmol/L (ref 3.5–5.1)
Sodium: 137 mmol/L (ref 135–145)
Total Bilirubin: 6 mg/dL — ABNORMAL HIGH (ref 0.3–1.2)
Total Protein: 5.6 g/dL — ABNORMAL LOW (ref 6.5–8.1)

## 2019-09-21 LAB — COOXEMETRY PANEL
Carboxyhemoglobin: 2.1 % — ABNORMAL HIGH (ref 0.5–1.5)
Methemoglobin: 1.2 % (ref 0.0–1.5)
O2 Saturation: 71.9 %
Total hemoglobin: 8.4 g/dL — ABNORMAL LOW (ref 12.0–16.0)

## 2019-09-21 LAB — GLUCOSE, CAPILLARY
Glucose-Capillary: 215 mg/dL — ABNORMAL HIGH (ref 70–99)
Glucose-Capillary: 192 mg/dL — ABNORMAL HIGH (ref 70–99)
Glucose-Capillary: 261 mg/dL — ABNORMAL HIGH (ref 70–99)
Glucose-Capillary: 145 mg/dL — ABNORMAL HIGH (ref 70–99)
Glucose-Capillary: 186 mg/dL — ABNORMAL HIGH (ref 70–99)
Glucose-Capillary: 173 mg/dL — ABNORMAL HIGH (ref 70–99)

## 2019-09-21 LAB — APTT: aPTT: 42 seconds — ABNORMAL HIGH (ref 24–36)

## 2019-09-21 LAB — PROTIME-INR
INR: 1.3 — ABNORMAL HIGH (ref 0.8–1.2)
Prothrombin Time: 15.4 seconds — ABNORMAL HIGH (ref 11.4–15.2)

## 2019-09-21 LAB — MAGNESIUM: Magnesium: 3.1 mg/dL — ABNORMAL HIGH (ref 1.7–2.4)

## 2019-09-21 LAB — PHOSPHORUS: Phosphorus: 5.1 mg/dL — ABNORMAL HIGH (ref 2.5–4.6)

## 2019-09-21 MED ORDER — "THROMBI-PAD 3""X3"" EX PADS"
1.0000 | MEDICATED_PAD | Freq: Once | CUTANEOUS | Status: AC
  Administered 2019-09-21: 1 via TOPICAL
  Filled 2019-09-21: qty 1

## 2019-09-21 MED ORDER — INSULIN DETEMIR 100 UNIT/ML ~~LOC~~ SOLN
14.0000 [IU] | Freq: Two times a day (BID) | SUBCUTANEOUS | Status: DC
  Administered 2019-09-21 – 2019-10-01 (×19): 14 [IU] via SUBCUTANEOUS
  Filled 2019-09-21 (×22): qty 0.14

## 2019-09-21 NOTE — Progress Notes (Signed)
eLink Physician-Brief Progress Note Patient Name: Todd [name] DOB: 02-Jun-1955 MRN: [id number]   Date of Service  09/21/2019  HPI/Events of Note  Patient is oozing from a skin tear.  eICU Interventions  Thrombin pad odered for the area, will chevk patient's coags.        [name] 09/21/2019, 12:48 AM

## 2019-09-21 NOTE — Progress Notes (Signed)
Physical Therapy Wound Treatment Patient Details  Name: Todd [name] MRN: [id number] Date of Birth: 22-Jul-1955  Today's Date: 09/21/2019 Time: 1443-1540 Time Calculation (min): 62 min  Subjective  Subjective: pt refused therapy yesterday and requires convincing today agrees after increased pain medication  Patient and Family Stated Goals: unable   Pain Score:  Pt given IV pain meds prior to treatment session. Pt with facial 10/10 pain with positioning and PT requested additional pain medication prior to pulse lavage. Provided pt with minimal relief during session.   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 has evolved into stage 4 when assessed on 8/9 (Active)  Wound Image   09/20/19 1400  Dressing Type ABD;Gauze (Comment);Barrier Film (skin prep);Non adherent;Moist to moist 09/21/19 1500  Dressing Changed;Clean;Dry;Intact 09/21/19 1500  Dressing Change Frequency Daily 09/21/19 1500  State of Healing Early/partial granulation 09/21/19 1500  Site / Wound Assessment Brown;Dusky;Pink;Red;Yellow 09/21/19 1500  % Wound base Red or Granulating 55% 09/21/19 1500  % Wound base Yellow/Fibrinous Exudate 40% 09/21/19 1500  % Wound base Black/Eschar 0% 09/20/19 1400  % Wound base Other/Granulation Tissue (Comment) 5% 09/21/19 1500  Peri-wound Assessment Denuded;Erythema (blanchable);Maceration;Pink 09/21/19 1500  Wound Length (cm) 6.1 cm 09/20/19 1400  Wound Width (cm) 5.1 cm 09/20/19 1400  Wound Depth (cm) 2 cm 09/20/19 1400  Wound Surface Area (cm^2) 31.11 cm^2 09/20/19 1400  Wound Volume (cm^3) 62.22 cm^3 09/20/19 1400  Undermining (cm) 1.1-1.6 cm around entire wound  09/20/19 1400  Margins Unattached edges (unapproximated) 09/21/19 1500  Drainage Amount None 09/21/19 1500  Drainage Description Serosanguineous 09/21/19 1500  Treatment Debridement (Selective);Hydrotherapy (Pulse lavage);Packing (Saline gauze)  09/21/19 1500   Santyl applied to necrotic tissue   Hydrotherapy Pulsed lavage therapy - wound location: sacrum Pulsed Lavage with Suction (psi): 12 psi Pulsed Lavage with Suction - Normal Saline Used: 1000 mL Pulsed Lavage Tip: Tip with splash shield Selective Debridement Selective Debridement - Location: sacrum Selective Debridement - Tools Used: Forceps;Scissors Selective Debridement - Tissue Removed: yellow/gray slough   Wound Assessment and Plan  Wound Therapy - Assess/Plan/Recommendations Wound Therapy - Clinical Statement: Pt very painful today. Increased pain just in positioning pt in flattened position. Requested additional pain medication. Pt better able to tolerate wound treatment however continued to moan with pulse lavage. Wound seems to be stalled, walls of wound have turned from red to brown and are no longer well vasculated. Removed small amount of necrotic material however pt with increased gluteal activation and moaning. May need to address if hydrotherapy warrented, given increase in pain. Will reassess with [place] next week.  Wound Therapy - Functional Problem List: decreased mobility Factors Delaying/Impairing Wound Healing: Diabetes Mellitus;Incontinence;Immobility;Multiple medical problems Hydrotherapy Plan: Debridement;Dressing change;Patient/family education;Pulsatile lavage with suction Wound Therapy - Frequency: 6X / week Wound Therapy - Follow Up Recommendations: Other (comment) (LTACH) Wound Plan: see above  Wound Therapy Goals- Improve the function of patient's integumentary system by progressing the wound(s) through the phases of wound healing (inflammation - proliferation - remodeling) by: Decrease Necrotic Tissue to: 20 Decrease Necrotic Tissue - Progress: Not progressing Increase Granulation Tissue to: 80 Increase Granulation Tissue - Progress: Mot progressing Time For Goal Achievement: 7 days Wound Therapy - Potential for Goals: Fair  Goals will  be updated until maximal potential achieved or discharge criteria met.  Discharge criteria: when goals achieved, discharge from hospital, MD decision/surgical intervention, no progress towards goals, refusal/missing three consecutive treatments without  notification or medical reason.  GP    Dani Gobble. Migdalia Dk PT, DPT Acute Rehabilitation Services Pager [phone] Office [phone]  [place] 09/21/2019, 3:52 PM

## 2019-09-21 NOTE — Progress Notes (Signed)
eLink Physician-Brief Progress Note Patient Name: Todd [name] DOB: 1955/07/30 MRN: [id number]   Date of Service  09/21/2019  HPI/Events of Note  Patient experiencing nausea which has not resolved despite a dose of Zofran.  eICU Interventions  Hold enteral nutrition for 2 hours then reassess patient.        Thomasene Lot Sujata Maines 09/21/2019, 11:21 PM

## 2019-09-21 NOTE — Plan of Care (Signed)
  Problem: Education: Goal: Knowledge of General Education information will improve Description: Including pain rating scale, medication(s)/side effects and non-pharmacologic comfort measures Outcome: Progressing   Problem: Clinical Measurements: Goal: Ability to maintain clinical measurements within normal limits will improve Outcome: Progressing Goal: Respiratory complications will improve Outcome: Progressing Goal: Cardiovascular complication will be avoided Outcome: Progressing   Problem: Activity: Goal: Risk for activity intolerance will decrease Outcome: Progressing   Problem: Nutrition: Goal: Adequate nutrition will be maintained Outcome: Progressing   Problem: Coping: Goal: Level of anxiety will decrease Outcome: Progressing   Problem: Elimination: Goal: Will not experience complications related to urinary retention Outcome: Progressing   Problem: Pain Managment: Goal: General experience of comfort will improve Outcome: Progressing   Problem: Safety: Goal: Ability to remain free from injury will improve Outcome: Progressing   Problem: Cardiac: Goal: Ability to achieve and maintain adequate cardiopulmonary perfusion will improve Outcome: Progressing   Problem: Education: Goal: Knowledge of disease or condition will improve Outcome: Progressing Goal: Knowledge of the prescribed therapeutic regimen will improve Outcome: Progressing   Problem: Cardiac: Goal: Will achieve and/or maintain hemodynamic stability Outcome: Progressing   Problem: Clinical Measurements: Goal: Postoperative complications will be avoided or minimized Outcome: Progressing   Problem: Respiratory: Goal: Respiratory status will improve Outcome: Progressing   Problem: Skin Integrity: Goal: Wound healing without signs and symptoms of infection Outcome: Progressing   Problem: Urinary Elimination: Goal: Ability to achieve and maintain adequate renal perfusion and functioning will  improve Outcome: Progressing   Problem: Health Behavior/Discharge Planning: Goal: Ability to manage health-related needs will improve Outcome: Not Progressing   Problem: Clinical Measurements: Goal: Will remain free from infection Outcome: Not Progressing Goal: Diagnostic test results will improve Outcome: Not Progressing   Problem: Elimination: Goal: Will not experience complications related to bowel motility Outcome: Not Progressing   Problem: Skin Integrity: Goal: Risk for impaired skin integrity will decrease Outcome: Not Progressing   Problem: Education: Goal: Ability to demonstrate management of disease process will improve Outcome: Not Progressing Goal: Ability to verbalize understanding of medication therapies will improve Outcome: Not Progressing   Problem: Activity: Goal: Capacity to carry out activities will improve Outcome: Not Progressing   Problem: Education: Goal: Will demonstrate proper wound care and an understanding of methods to prevent future damage Outcome: Not Progressing   Problem: Activity: Goal: Risk for activity intolerance will decrease Outcome: Not Progressing   Problem: Skin Integrity: Goal: Risk for impaired skin integrity will decrease Outcome: Not Progressing

## 2019-09-21 NOTE — Progress Notes (Signed)
SLP Discharge Note  Patient Details Name: Todd [name] MRN: [id number] DOB: December 12, 1955   Cancelled assessment: Pt has not been appropriate for PMV assessment, either inline or while on ATC (the latter has been for brief periods averaging an hour) given his tenuous condition.  Spoke with Dr. Vassie Loll, who will re-order SLP for PMV when pt is more appropriate.  SLP will sign off.  Belinda Schlichting L. Samson Frederic, MA CCC/SLP Acute Rehabilitation Services Office number [phone] Pager [phone]            Blenda Mounts Laurice 09/21/2019, 9:52 AM

## 2019-09-21 NOTE — Progress Notes (Signed)
SLP Cancellation Note  Patient Details Name: Todd [name] MRN: [id number] DOB: April 28, 1955   New orders received for inline PMV evaluation.  SLP will f/u early next week.  Elona Yinger L. Samson Frederic, MA CCC/SLP Acute Rehabilitation Services Office number [phone] Pager [phone]           [name] 09/21/2019, 4:44 PM

## 2019-09-21 NOTE — Progress Notes (Addendum)
NAME:  Todd [name], MRN:  [id number], DOB:  09/17/1955, LOS: 65 ADMISSION DATE:  07/26/2019, CONSULTATION DATE:  08/03/19 REFERRING MD:  Roxan Hockey, CHIEF COMPLAINT:  ECMO   Brief History   64 yo male admitted with CHF exacerbation, found to have multi-vessel disease requiring CABG 6/24, post op VT Arrest 6/25 and cannulated for VA ECMO.    History of present illness   Presented with worsening dyspnea 6/17 c/w CHF exacerbation.  LHC  with severe triple vessel disease.  Underwent CABG 6/24. Vtach arrest 6/25.  Chest opened bedside and cardiac massage initiated as well as multiple cardioversions, amiodarone, bicarb etc.  Brought to OR and cannulated for VA ECMO.  PCCM consulted to assist with management  Comorbidities include DM, heavy smoking, COPD  Past Medical History  Depression Ischemic cardiomyopathy HTN HLD  Significant Hospital Events   6/24 CABG 6/25 VA cannulation 6/28 decannulated and impella placed 6/29 bedside re-exploration; s/p decannulation. Placement of impella device originally at p8 but decreased to p4 2/2 suction events overnight up to p6. Echo completed and repositioned device. Remained on considerable support with 8 epi and 46 norepi vaso 0.05. continued chest tube output with large clots noted. Heparin thru device. Replacement products ongoing. 60% 8 peep 6/30 bedside mediastinal re-exploration and clean out of hematoma with tamponade and worsening hemodynamics. Pt had large volume transfusion. rebolused with amio 2/2 nsvt episode.  Weight up 48 pounds.  Started diuresis, Lasix drip started. 7/01 iatrogenic respiratory alkalosis, vent rate decreased. 7/02 No significant issues overnight remains on pressors and Impella not tolerating tube feeds with high gastric output awaiting core track placement 7/03 started trickle TF  7/05 Swab removed, CVL and PICC placed. 7/13 Trach and BAL 7/16 Awake and tracking. No follow commands. Still having fevers. Successful  ultrasound and CT guided placement of a 10.2 French cholecystostomy tube. A small amount of aspirated bile was capped and sent to the laboratory for analysis 7/17 Off sedation, PSV.  Beginning to follow some commands occasionally intermittently.  Very jaundiced.  Growing Candida in the blood and BAL > diflucan.   7/21 Ongoing fevers, WBC increased 7/22 Persistent fevers, pan cultured with CT Chest/Head. Suspected source tracheobronchitis.  7/28 Transitioned to eliquis, jaundiced but labs improving  7/29 Agitation/restless.  Trazodone added QHS. Weaning midodrine, attempting diuresis  7/30 Increased Na, weaning on PSV.  8/01 On 40% ATC, significantly improved mental status 8/1>> ongoing attempts at trach weaning complicated by weakness, secretion management, and anxiety 8/8 started back on meropenem and milrinone 8/12 patient making slow improvements, he has been able to tolerate short course ATC   Consults:  Advanced heart failure, PCCM, TCTS  Procedures:  LUE PICC 7/5 >>7/21 R Brachial ALine 7/9 >>7/27 Chest Tubes >> L IJ CVC 7/21 >>7/27 Peg tube 7/27 >>  Significant Diagnostic Tests:  6/22 spirometry with restrictive physiology, preserved FEV1/FVC 6/18 echo: LVEF 11-94%, grade I diastolic dysfunction 1/74 US Abdomen cholelithiasis without cholecystitis. Mild wall thinking in setting of liver disease and ascites.  7/10 LE [place] negative for DVT  7/20 CT ABD w contrast >> stable position of the percutaneous cholecystostomy tube with complete decompression of the gallbladder, trace residual free fluid within the RUQ, decreased since prior, trace bilateral pleural effusions L>R, scattered areas of ground glass airspace disease within the RML, RLL 7/22 CT Chest w/o Contrast >> Retrosternal fluid collection, non specific.  Bilateral pleural effusions, portions loculated.   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  Stable bilateral mastoid and middle ear effusions.   Micro Data:  7/14 blood >> candida parapsilosis resp 8/7 >> stenotrophomonas  Antimicrobials:  Meropenem 8/8 >>8/10 Diflucan  7/16 >> 8/9 vanc 7/22 >>8/10 levaquin 8/10 >>  Interim history/subjective:   Chronic critically ill, trach Afebrile Appears frail and deconditioned but more alert today   Objective   Blood pressure (!) 124/53, pulse 70, temperature 98.1 F (36.7 C), resp. rate 18, height _0  (1.753 m), weight 77.9 kg, SpO2 98 %.    Vent Mode: CPAP;PSV FiO2 (%):  [40 %] 40 % Set Rate:  [14 bmp] 14 bmp Vt Set:  [420 mL] 420 mL PEEP:  [8 cmH20-12 cmH20] 12 cmH20 Plateau Pressure:  [18 cmH20-21 cmH20] 19 cmH20   Intake/Output Summary (Last 24 hours) at 09/21/2019 1047 Last data filed at 09/21/2019 0640 Gross per 24 hour  Intake 1536.09 ml  Output 2590 ml  Net -1053.91 ml   Filed Weights   09/19/19 0305 09/20/19 0500 09/21/19 0356  Weight: 78.4 kg 79.4 kg 77.9 kg   Exam General: Chronically ill-appearing, no distress HEENT: Norco/AT, MM pink/moist, PERRL, sclera  icteric Neuro: More alert, interactive, grossly nonfocal, tremors CV: s1s2 regular rate and rhythm, no murmur, minor bleeding from lower edge of sternal incision PULM: Bilateral ventilated breath sounds, no accessory muscle use, pressure support 10/5 GI: soft, bowel sounds active in all 4 quadrants, non-tender, non-distended, tolerating TF Extremities: warm/dry, no edema  Skin: Jaundice, ecchymosis, no rash  Labs show rising BUN, stable creatinine, stable high LFTs , slight drop in hemoglobin, stable leukocytosis   Lab Results  Component Value Date   WBC 13.1 (H) 09/21/2019   HGB 7.2 (L) 09/21/2019   HCT 25.1 (L) 09/21/2019   MCV 98.4 09/21/2019   PLT 224 09/21/2019   Lab Results  Component Value Date   CREATININE 1.31 (H) 09/21/2019   BUN 156 (H) 09/21/2019   NA 137 09/21/2019   K 4.8 09/21/2019   CL 95 (L) 09/21/2019   CO2 33 (H)  09/21/2019   Resolved Hospital Problem list   Suspected HCAP Acute kidney injury, improved  Hypernatremia, improved  Acute Metabolic Encephalopathy  -CTH without acute intracranial pathology. EEG negative 7/23.  Shock Heparin-induced thrombocytopenia >> resolved, required bivalirudin 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 -Opthalmology evaluated , diflucan x 3 weeks  Assessment & Plan:   Acute hypoxic respiratory failure w/ need for mechanical ventilation  - status post tracheostomy - Poor lung mechanics due to weakness, deconditioning  8/8: Worsening related to stenotrophomonas HAP P: Patient is tolerating daily short-term ATC trials x1h , prolong as able but expect slow wean Routine trach care Aggressive PT/OT therapies Encourage frequent pulmonary hygiene Continue chest PT Levaquin for 14 days  Cardiogenic/hemorrhagic shock status post CABG complicated by VT arrest, improved S/p VA ECMO (decanulated 6/28).  S/p impella placement (6/28> 7/9) P: Primary management per heart failure and TCTS SVO 2 okay on 0.125 milrinone, discontinue today Follow strict intake and output  Atrial flutter , reverted to nSR P: Continue aspirin Continue to follow LFTs closely For now continue amiodarone if LFTs worsen may need to consider discontinuation Continuous telemetry  Hyperbilirubinemia due to acalculous cholecystitis vs drug-induced cholestasis Possible cirrhosis per 7/8 [place]  Status post  percutaneous GB drain  Jaundice-bilirubin  P: Drain per IR Trend LFTs and total bili  DM II with fluctuant hypo/hyperglycemia P: CBGs every 4 Continue tube feeds Continue Levemir   Acute blood  loss anemia and critical illness anemia , sp 1 U PRBC 8/5 & 8/9 , hemoglobin trending down again HIT, resolved  P: Eliquis stopped Hold Arixtra for 48 hours and monitor hemoglobin  Acute kidney injury, improved  Hypernatremia, improved  P: Trend bmet/urine output Avoid  nephrotoxins Diuretic holiday Continue free water  Anxiety -likely ICU related stress disorder P: Continue Prozac, Seroquel nightly, and Klonopin In line Speech valve  Sacral Decubitus  P: Hydrotherapy per PT WIC following Frequent turns Pressure alleviating devices Ensure adequate pain control  Deconditioning of Critical Illness  P: Continue PT/OT efforts  Small left adrenal mass seen on CT scan of the abdomen August 24, 2019 Patient need to follow-up in the outpatient setting  Daily Goals Checklist  Pain/Anxiety/Delirium protocol (if indicated): see above  VAP protocol (if indicated): bundle in place.  DVT prophylaxis: eliquis Nutrition Status: TF  GI prophylaxis: PPI Urinary catheter: n/a Central lines: removed Glucose control: See above Mobility/therapy needs: PT for ROM  Code Status: Full  Family Communication: per primary Disposition: ICU -- await LTACH   The patient is critically ill with multiple organ systems failure and requires high complexity decision making for assessment and support, frequent evaluation and titration of therapies, application of advanced monitoring technologies and extensive interpretation of multiple databases. Critical Care Time devoted to patient care services described in this note independent of APP/resident  time is 31 minutes.   Kara Mead MD. [facility]. [facility]  If no response to pager , please call [phone]     09/21/2019, 10:47 AM

## 2019-09-21 NOTE — Progress Notes (Signed)
Physical Therapy Treatment Patient Details Name: Todd [name] MRN: [id number] DOB: 1955-06-01 Today's Date: 09/21/2019    History of Present Illness Todd [name] is a 64 y.o. male presenting with shortness of breath; noted AKI, CHF, recent PNA, elevated troponin, pulm edema/effusion, and cardiomegaly.Marland Kitchen CABG 6/24 and intubated (extubated 6/25)--went into cardiac arrest on same date with re-opening of chest for heart massage and direct epicardial paddles and place on ECMO. 08/03/19 washout out for tamponade. 7/2 sternal closure and wound vac. 7/9 Impella removed.08/20/19 Tracheostomy 08/24/19 cholecystostomy tube. 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 PMHx: HFrEF, COPD, HTN, T2DM, HLD, MDD, chronic back pain, vitamin deficiency, BPH, tobacco use disorder, marijuana use disorder    PT Comments    Pt admitted with above diagnosis. Pt was able to tolerate standing in tilt bed for 12 minutes at 50 degrees.  Vitals as below.  Progressing slowly.  Goals revised today. Pt has met 0/6 goals due to has had multiple medical issues.  Pt currently with functional limitations due to balance and endurance deficits. Pt will benefit from skilled PT to increase their independence and safety with mobility to allow discharge to the venue listed below.    Vent PEEP 8, 40% FiO2 Pre exercise HR 73 bpm, 126/51, 99% O2 35 degree tilt - 121/53, 73 bpm 50 degree tilt - 105/51, 73 bpm Bed in chair position - 126/49, 74 bpm   Follow Up Recommendations  LTACH;SNF     Equipment Recommendations  Other (comment) (to be determined in next venue)    Recommendations for Other Services       Precautions / Restrictions Precautions Precautions: Fall;Sternal Precaution Booklet Issued: No Precaution Comments: trach, chole drain, sacral wound Required Braces or Orthoses: Other Brace Other Brace: B Prevalon Restrictions Other Position/Activity Restrictions: sternal precautions      Mobility  Bed Mobility Overal bed mobility: Needs Assistance             General bed mobility comments: Moved pt up in bed with total assist of 2  Transfers Overall transfer level: Needs assistance Equipment used:  (Kreg Bed) Transfers: Sit to/from Stand Sit to Stand: +2 safety/equipment;Total assist         General transfer comment: Tilted bed to 50 degrees with pt tolerating for about 12 minutes.   Ambulation/Gait         Gait velocity: unable       Stairs             Wheelchair Mobility    Modified Rankin (Stroke Patients Only)       Balance           Standing balance support: No upper extremity supported;During functional activity Standing balance-Leahy Scale: Zero Standing balance comment: bil trunk and sacral support, knees blocked by straps on tilt bed, dependent on 2 person assist/bed assist                            Cognition Arousal/Alertness: Awake/alert Behavior During Therapy: Flat affect Overall Cognitive Status: Difficult to assess Area of Impairment: Following commands                 Orientation Level: Disoriented to;Time;Situation Current Attention Level: Sustained   Following Commands: Follows one step commands with increased time Safety/Judgement: Decreased awareness of safety;Decreased awareness of deficits Awareness: Intellectual Problem Solving: Slow processing;Decreased initiation;Difficulty sequencing;Requires verbal cues;Requires tactile cues General Comments: pt able to follow one  step commands today and nod head but at times mouthing words and unable to discern statements with pt unable to legibly write (unsure if illiterate)      Exercises General Exercises - Lower Extremity Ankle Circles/Pumps: AAROM;PROM;Both;5 reps;Seated Quad Sets: AROM;Both;10 reps;Supine Mini-Sqauts: Both;Standing;AAROM;5 reps (attempted in tilt bed but pt stated it was hard to move)    General Comments         Pertinent Vitals/Pain Pain Assessment: Faces Faces Pain Scale: Hurts little more Pain Location: LUE with ROM Pain Descriptors / Indicators: Grimacing;Guarding Pain Intervention(s): Limited activity within patient's tolerance;Monitored during session;Repositioned    Home Living                      Prior Function            PT Goals (current goals can now be found in the care plan section) Acute Rehab PT Goals Patient Stated Goal: pt unable to state PT Goal Formulation: With patient/family Time For Goal Achievement: 10/05/19 Potential to Achieve Goals: Fair Progress towards PT goals: Progressing toward goals    Frequency    Min 2X/week      PT Plan Current plan remains appropriate    Co-evaluation              AM-PAC PT "6 Clicks" Mobility   Outcome Measure  Help needed turning from your back to your side while in a flat bed without using bedrails?: Total Help needed moving from lying on your back to sitting on the side of a flat bed without using bedrails?: Total Help needed moving to and from a bed to a chair (including a wheelchair)?: Total Help needed standing up from a chair using your arms (e.g., wheelchair or bedside chair)?: Total Help needed to walk in hospital room?: Total Help needed climbing 3-5 steps with a railing? : Total 6 Click Score: 6    End of Session Equipment Utilized During Treatment: Gait belt;Other (comment) (trach with vent) Activity Tolerance: Patient limited by pain;Patient limited by fatigue Patient left: with call bell/phone within reach;in bed;with SCD's reapplied Nurse Communication: Mobility status PT Visit Diagnosis: Unsteadiness on feet (R26.81);Muscle weakness (generalized) (M62.81);Difficulty in walking, not elsewhere classified (R26.2)     Time: 1855-0158 PT Time Calculation (min) (ACUTE ONLY): 32 min  Charges:  $Therapeutic Exercise: 8-22 mins $Therapeutic Activity: 8-22 mins                     Todd Sunga  [name],PT Acute Rehabilitation Services Pager:  [phone]  Office:  [place] 09/21/2019, 10:56 AM

## 2019-09-21 NOTE — Progress Notes (Signed)
35 Days Post-Op Procedure(s) (LRB): REMOVAL OF IMPELLA LEFT VENTRICULAR ASSIST DEVICE (N/A) TRANSESOPHAGEAL ECHOCARDIOGRAM (TEE) (N/A) Subjective: Alert, denies pain  Objective: Vital signs in last 24 hours: Temp:  [98.1 F (36.7 C)-98.7 F (37.1 C)] 98.1 F (36.7 C) (08/13 0723) Pulse Rate:  [69-79] 70 (08/13 0700) Cardiac Rhythm: Normal sinus rhythm (08/13 0400) Resp:  [14-21] 18 (08/13 0756) BP: (109-130)/(43-63) 124/53 (08/13 0756) SpO2:  [89 %-100 %] 98 % (08/13 0756) FiO2 (%):  [40 %] 40 % (08/13 0756) Weight:  [77.9 kg] 77.9 kg (08/13 0356)  Hemodynamic parameters for last 24 hours:    Intake/Output from previous day: 08/12 0701 - 08/13 0700 In: 2128.6 [P.O.:240; I.V.:298.6; NG/GT:1440; IV Piggyback:150] Out: 2970 [Urine:2415; Drains:355; Stool:200] Intake/Output this shift: No intake/output data recorded.  General appearance: alert, cooperative and no distress Neurologic: intact Heart: regular rate and rhythm Lungs: clear to auscultation bilaterally Abdomen: normal findings: soft, non-tender Wound: bloody drainage from inferior aspect of sternal incision  Lab Results: Recent Labs    09/20/19 0433 09/21/19 0350  WBC 11.6* 13.1*  HGB 7.5* 7.2*  HCT 25.1* 25.1*  PLT 219 224   BMET:  Recent Labs    09/20/19 0433 09/21/19 0350  NA 135 137  K 4.3 4.8  CL 94* 95*  CO2 31 33*  GLUCOSE 172* 226*  BUN 147* 156*  CREATININE 1.29* 1.31*  CALCIUM 8.1* 8.2*    PT/INR:  Recent Labs    09/21/19 0152  LABPROT 15.4*  INR 1.3*   ABG    Component Value Date/Time   PHART 7.348 (L) 09/16/2019 1714   HCO3 32.0 (H) 09/16/2019 1714   TCO2 34 (H) 09/16/2019 1714   ACIDBASEDEF 1.0 08/08/2019 1115   O2SAT 71.9 09/21/2019 0353   CBG (last 3)  Recent Labs    09/20/19 2320 09/21/19 0342 09/21/19 0720  GLUCAP 192* 215* 192*    Assessment/Plan: S/P Procedure(s) (LRB): REMOVAL OF IMPELLA LEFT VENTRICULAR ASSIST DEVICE (N/A) TRANSESOPHAGEAL ECHOCARDIOGRAM  (TEE) (N/A) -Looks better today CV- co-ox 72 on 0.125 mcg/kg/min of milrinone  Agree with plan to try off milrinone- repeat co-ox in AM RESP- VDRF, vent wean, TC trials per CCM ID- stenotrophomonas pneumonia- on Levaquin day 2/14 RENAL- creatinine stable ENDO- CBG up a little, continue SSI Drainage from inferior aspect of sternal incision. No erythema, bone is stable, monitor Heme- anemia- Hct stable, possible GI bleed. Eliquis discontinued, Dr. Vassie Loll will start DVT prophylaxis today Deconditioning- continue PT, OT, speech   LOS: 57 days    Todd [name] 09/21/2019

## 2019-09-21 NOTE — Progress Notes (Signed)
Per Dr. Vassie Loll, okay to downsize pt trach to a #6 cuffed shiley. Dr. Vassie Loll made aware RT may not be able to perform until later in shift. MD verbalizes that okay to change tomorrow 8/14. RT will continue to monitor.

## 2019-09-21 NOTE — Progress Notes (Signed)
eLink Physician-Brief Progress Note Patient Name: Todd [name] DOB: July 14, 1955 MRN: [id number]   Date of Service  09/21/2019  HPI/Events of Note  Pt has a chronic tracheostomy, he drank some water that was left on a side table in his room, he is completely asymptomatic with a saturation of 100 %,  eICU Interventions  Monitor patient for development of symptoms which will trigger imaging studies to r/o aspiration, no active intervention for now.        Thomasene Lot Jaiyon Wander 09/21/2019, 10:13 PM

## 2019-09-21 NOTE — TOC Progression Note (Addendum)
Transition of Care [facility]) - Progression Note    Patient Details  Name: MARGUES FILIPPINI MRN: [id number] Date of Birth: 1955-06-29  Transition of [facility]) CM/SW Contact  Terrial Rhodes, LCSWA Phone Number: 09/21/2019, 1:56 PM  Clinical Narrative:     CSW heard back from [facility] and there is no bed availability at this time.[facility] cannot offer patient a SNF bed at this time due to patient being too critical for placement. CSW awaiting call back from [facility] to see if they can accept patient for SNF placement.  Pending bed offer.  CSW will continue to follow.   Expected Discharge Plan: Long Term Acute Care (LTAC) Barriers to Discharge: Insurance Authorization  Expected Discharge Plan and Services Expected Discharge Plan: Long Term Acute Care (LTAC)   Discharge Planning Services: CM Consult Post Acute Care Choice: Long Term Acute Care (LTAC) Living arrangements for the past 2 months: Single Family Home                                       Social Determinants of Health (SDOH) Interventions    Readmission Risk Interventions Readmission Risk Prevention Plan 09/03/2019  Transportation Screening Complete  PCP or Specialist Appt within 3-5 Days Complete  HRI or Home Care Consult Complete  Social Work Consult for Recovery Care Planning/Counseling Complete  Palliative Care Screening Complete  Medication Review [employer]) Complete  Some recent data might be hidden

## 2019-09-21 NOTE — Progress Notes (Signed)
EVENING ROUNDS NOTE :     [address]       [place] [address]             [phone]                 35 Days Post-Op Procedure(s) (LRB): REMOVAL OF IMPELLA LEFT VENTRICULAR ASSIST DEVICE (N/A) TRANSESOPHAGEAL ECHOCARDIOGRAM (TEE) (N/A)   Total Length of Stay:  LOS: 57 days  Events:   No events Remains vented Resting comfortably Continue rehab     BP 128/65   Pulse 70   Temp 97.6 F (36.4 C)   Resp 20   Ht 5\' 9"  (1.753 m)   Wt 77.9 kg   SpO2 99%   BMI 25.36 kg/m      Vent Mode: PRVC FiO2 (%):  [40 %] 40 % Set Rate:  [14 bmp] 14 bmp Vt Set:  [420 mL] 420 mL PEEP:  [8 cmH20-12 cmH20] 12 cmH20 Plateau Pressure:  [18 cmH20-20 cmH20] 20 cmH20  . sodium chloride 10 mL/hr at 09/21/19 0700  . feeding supplement (PIVOT 1.5 CAL) 1,000 mL (09/21/19 1536)  . levofloxacin (LEVAQUIN) IV 750 mg (09/21/19 1528)    I/O last 3 completed shifts: In: 3139.5 [P.O.:240; I.V.:494.6; Other:45; NG/GT:2210; IV Piggyback:150] Out: 3845 [Urine:3165; Drains:480; Stool:200]   CBC Latest Ref Rng & Units 09/21/2019 09/20/2019 09/19/2019  WBC 4.0 - 10.5 K/uL 13.1(H) 11.6(H) 10.2  Hemoglobin 13.0 - 17.0 g/dL 7.2(L) 7.5(L) 7.4(L)  Hematocrit 39 - 52 % 25.1(L) 25.1(L) 24.5(L)  Platelets 150 - 400 K/uL 224 219 201    BMP Latest Ref Rng & Units 09/21/2019 09/20/2019 09/19/2019  Glucose 70 - 99 mg/dL 11/19/2019) 657(Q) 469(G)  BUN 8 - 23 mg/dL 295(M) 841(L) 244(W)  Creatinine 0.61 - 1.24 mg/dL 102(V) 2.53(G) 6.44(I)  Sodium 135 - 145 mmol/L 137 135 137  Potassium 3.5 - 5.1 mmol/L 4.8 4.3 3.8  Chloride 98 - 111 mmol/L 95(L) 94(L) 98  CO2 22 - 32 mmol/L 33(H) 31 30  Calcium 8.9 - 10.3 mg/dL 8.2(L) 8.1(L) 7.7(L)    ABG    Component Value Date/Time   PHART 7.348 (L) 09/16/2019 1714   PCO2ART 58.2 (H) 09/16/2019 1714   PO2ART 65 (L) 09/16/2019 1714   HCO3 32.0 (H) 09/16/2019 1714   TCO2 34 (H) 09/16/2019 1714   ACIDBASEDEF 1.0 08/08/2019 1115   O2SAT 71.9 09/21/2019 0353        09/23/2019, MD 09/21/2019 5:37 PM

## 2019-09-22 ENCOUNTER — Inpatient Hospital Stay (HOSPITAL_COMMUNITY): Payer: Medicare Other

## 2019-09-22 DIAGNOSIS — Z930 Tracheostomy status: Secondary | ICD-10-CM | POA: Diagnosis not present

## 2019-09-22 DIAGNOSIS — J9601 Acute respiratory failure with hypoxia: Secondary | ICD-10-CM | POA: Diagnosis not present

## 2019-09-22 DIAGNOSIS — Z9911 Dependence on respirator [ventilator] status: Secondary | ICD-10-CM | POA: Diagnosis not present

## 2019-09-22 LAB — BASIC METABOLIC PANEL
Anion gap: 8 (ref 5–15)
BUN: 146 mg/dL — ABNORMAL HIGH (ref 8–23)
CO2: 33 mmol/L — ABNORMAL HIGH (ref 22–32)
Calcium: 8.3 mg/dL — ABNORMAL LOW (ref 8.9–10.3)
Chloride: 98 mmol/L (ref 98–111)
Creatinine, Ser: 1.11 mg/dL (ref 0.61–1.24)
GFR calc Af Amer: >60 mL/min (ref >60)
GFR calc non Af Amer: >60 mL/min (ref >60)
Glucose, Bld: 127 mg/dL — ABNORMAL HIGH (ref 70–99)
Potassium: 4.6 mmol/L (ref 3.5–5.1)
Sodium: 139 mmol/L (ref 135–145)

## 2019-09-22 LAB — COMPREHENSIVE METABOLIC PANEL
ALT: 132 U/L — ABNORMAL HIGH (ref 0–44)
AST: 131 U/L — ABNORMAL HIGH (ref 15–41)
Albumin: 1.5 g/dL — ABNORMAL LOW (ref 3.5–5.0)
Alkaline Phosphatase: 919 U/L — ABNORMAL HIGH (ref 38–126)
Anion gap: 8 (ref 5–15)
BUN: 137 mg/dL — ABNORMAL HIGH (ref 8–23)
CO2: 35 mmol/L — ABNORMAL HIGH (ref 22–32)
Calcium: 8.6 mg/dL — ABNORMAL LOW (ref 8.9–10.3)
Chloride: 99 mmol/L (ref 98–111)
Creatinine, Ser: 0.99 mg/dL (ref 0.61–1.24)
GFR calc Af Amer: >60 mL/min (ref >60)
GFR calc non Af Amer: >60 mL/min (ref >60)
Glucose, Bld: 187 mg/dL — ABNORMAL HIGH (ref 70–99)
Potassium: 5 mmol/L (ref 3.5–5.1)
Sodium: 142 mmol/L (ref 135–145)
Total Bilirubin: 6.4 mg/dL — ABNORMAL HIGH (ref 0.3–1.2)
Total Protein: 6.4 g/dL — ABNORMAL LOW (ref 6.5–8.1)

## 2019-09-22 LAB — POCT I-STAT 7, (LYTES, BLD GAS, ICA,H+H)
Acid-Base Excess: 10 mmol/L — ABNORMAL HIGH (ref 0.0–2.0)
Acid-Base Excess: 12 mmol/L — ABNORMAL HIGH (ref 0.0–2.0)
Bicarbonate: 38.9 mmol/L — ABNORMAL HIGH (ref 20.0–28.0)
Bicarbonate: 39.3 mmol/L — ABNORMAL HIGH (ref 20.0–28.0)
Calcium, Ion: 1.22 mmol/L (ref 1.15–1.40)
Calcium, Ion: 1.2 mmol/L (ref 1.15–1.40)
HCT: 30 % — ABNORMAL LOW (ref 39.0–52.0)
HCT: 29 % — ABNORMAL LOW (ref 39.0–52.0)
Hemoglobin: 10.2 g/dL — ABNORMAL LOW (ref 13.0–17.0)
Hemoglobin: 9.9 g/dL — ABNORMAL LOW (ref 13.0–17.0)
O2 Saturation: 97 %
O2 Saturation: 94 %
Patient temperature: 98
Patient temperature: 98
Potassium: 5.5 mmol/L — ABNORMAL HIGH (ref 3.5–5.1)
Potassium: 4.9 mmol/L (ref 3.5–5.1)
Sodium: 143 mmol/L (ref 135–145)
Sodium: 144 mmol/L (ref 135–145)
TCO2: 41 mmol/L — ABNORMAL HIGH (ref 22–32)
TCO2: 41 mmol/L — ABNORMAL HIGH (ref 22–32)
pCO2 arterial: 82.9 mmHg (ref 32.0–48.0)
pCO2 arterial: 68.3 mmHg (ref 32.0–48.0)
pH, Arterial: 7.277 — ABNORMAL LOW (ref 7.350–7.450)
pH, Arterial: 7.366 (ref 7.350–7.450)
pO2, Arterial: 111 mmHg — ABNORMAL HIGH (ref 83.0–108.0)
pO2, Arterial: 76 mmHg — ABNORMAL LOW (ref 83.0–108.0)

## 2019-09-22 LAB — CBC
HCT: 28.3 % — ABNORMAL LOW (ref 39.0–52.0)
HCT: 31.5 % — ABNORMAL LOW (ref 39.0–52.0)
Hemoglobin: 8.1 g/dL — ABNORMAL LOW (ref 13.0–17.0)
Hemoglobin: 8.9 g/dL — ABNORMAL LOW (ref 13.0–17.0)
MCH: 28.3 pg (ref 26.0–34.0)
MCH: 28.2 pg (ref 26.0–34.0)
MCHC: 28.6 g/dL — ABNORMAL LOW (ref 30.0–36.0)
MCHC: 28.3 g/dL — ABNORMAL LOW (ref 30.0–36.0)
MCV: 99 fL (ref 80.0–100.0)
MCV: 99.7 fL (ref 80.0–100.0)
Platelets: 246 10*3/uL (ref 150–400)
Platelets: 278 10*3/uL (ref 150–400)
RBC: 2.86 MIL/uL — ABNORMAL LOW (ref 4.22–5.81)
RBC: 3.16 MIL/uL — ABNORMAL LOW (ref 4.22–5.81)
RDW: 17.4 % — ABNORMAL HIGH (ref 11.5–15.5)
RDW: 17.3 % — ABNORMAL HIGH (ref 11.5–15.5)
WBC: 14.4 10*3/uL — ABNORMAL HIGH (ref 4.0–10.5)
WBC: 15.3 10*3/uL — ABNORMAL HIGH (ref 4.0–10.5)
nRBC: 0 % (ref 0.0–0.2)
nRBC: 0 % (ref 0.0–0.2)

## 2019-09-22 LAB — COOXEMETRY PANEL
Carboxyhemoglobin: 1.7 % — ABNORMAL HIGH (ref 0.5–1.5)
Methemoglobin: 0.9 % (ref 0.0–1.5)
O2 Saturation: 67.2 %
Total hemoglobin: 14 g/dL (ref 12.0–16.0)

## 2019-09-22 LAB — GLUCOSE, CAPILLARY
Glucose-Capillary: 167 mg/dL — ABNORMAL HIGH (ref 70–99)
Glucose-Capillary: 175 mg/dL — ABNORMAL HIGH (ref 70–99)
Glucose-Capillary: 200 mg/dL — ABNORMAL HIGH (ref 70–99)
Glucose-Capillary: 201 mg/dL — ABNORMAL HIGH (ref 70–99)
Glucose-Capillary: 211 mg/dL — ABNORMAL HIGH (ref 70–99)
Glucose-Capillary: 96 mg/dL (ref 70–99)

## 2019-09-22 LAB — MAGNESIUM: Magnesium: 3.4 mg/dL — ABNORMAL HIGH (ref 1.7–2.4)

## 2019-09-22 MED ORDER — SODIUM CHLORIDE 3 % IN NEBU
4.0000 mL | INHALATION_SOLUTION | Freq: Two times a day (BID) | RESPIRATORY_TRACT | Status: DC
  Administered 2019-09-22 – 2019-09-25 (×6): 4 mL via RESPIRATORY_TRACT
  Filled 2019-09-22 (×6): qty 4

## 2019-09-22 MED ORDER — SODIUM CHLORIDE 3 % IN NEBU
4.0000 mL | INHALATION_SOLUTION | RESPIRATORY_TRACT | Status: DC | PRN
  Filled 2019-09-22: qty 4

## 2019-09-22 MED ORDER — CHLORHEXIDINE GLUCONATE 0.12 % MT SOLN
OROMUCOSAL | Status: AC
  Administered 2019-09-22: 15 mL via OROMUCOSAL
  Filled 2019-09-22: qty 15

## 2019-09-22 NOTE — Code Documentation (Signed)
  Patient Name: Todd [name]   MRN: [id number]   Date of Birth/ Sex: [birth year] , male      Admission Date: 07/26/2019  Attending Provider: [name], MD  Primary Diagnosis: Candidemia [facility])   Indication: Pt was in his usual state of health until this PM, when he was noted to be hypoxic with subsequent pea arrest. Code blue was subsequently called. At the time of arrival on scene, ACLS protocol was underway.   Technical Description:  - CPR performance duration:  6 minutes  - Was defibrillation or cardioversion used? No   - Was external pacer placed? No  - Was patient intubated pre/post CPR? Patient has tracheostomy and was on pressure support prior to the arrest.   Medications Administered: Y = Yes; Blank = No Amiodarone    Atropine    Calcium    Epinephrine    Lidocaine    Magnesium    Norepinephrine    Phenylephrine    Sodium bicarbonate    Vasopressin     Post CPR evaluation:  - Final Status - Was patient successfully resuscitated ? Yes - What is current rhythm? Sinus  - What is current hemodynamic status? stable  Miscellaneous Information:  - Labs sent, including: CBC, CMP, Mg, ABG x2  - Primary team notified?  Yes  - Family Notified? Yes  - Additional notes/ transfer status: Patient achieved ROSC in 6 minutes. He was alert following commands post arrest. ABG showed signs of hypercarbia. Patient will be placed back on mechanical ventilation and repeat ABG in 30 minutes. I will add hypertonic saline nebulizer to help clear his secretions/mucus. He may benefit from chest physo (vest therapy).      Dellia Cloud, MD  09/22/2019, 9:12 PM

## 2019-09-22 NOTE — Progress Notes (Signed)
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 has evolved into stage 4 when assessed on 8/9 (Active)  Wound Image   09/20/19 1400  Dressing Type Abdominal binder;Non adherent;Gauze (Comment);Barrier Film (skin prep) 09/22/19 0959  Dressing Clean;Dry;Intact;Changed 09/22/19 0959  Dressing Change Frequency Daily 09/22/19 0959  State of Healing Eschar 09/22/19 0959  Site / Wound Assessment Pink;Yellow;Painful;Brown;Dusky 09/22/19 0959  % Wound base Red or Granulating 55% 09/22/19 0959  % Wound base Yellow/Fibrinous Exudate 40% 09/22/19 0959  % Wound base Black/Eschar 0% 09/22/19 0959  % Wound base Other/Granulation Tissue (Comment) 5% 09/22/19 0959  Peri-wound Assessment Erythema (blanchable);Denuded;Maceration;Pink 09/22/19 0959  Wound Length (cm) 6.1 cm 09/20/19 1400  Wound Width (cm) 5.1 cm 09/20/19 1400  Wound Depth (cm) 2 cm 09/20/19 1400  Wound Surface Area (cm^2) 31.11 cm^2 09/20/19 1400  Wound Volume (cm^3) 62.22 cm^3 09/20/19 1400  Undermining (cm) 1.1-1.6 cm around entire wound  09/20/19 1400  Margins Unattached edges (unapproximated) 09/22/19 0959  Drainage Amount Scant 09/22/19 0959  Drainage Description Serosanguineous 09/22/19 0959  Treatment Debridement (Selective);Hydrotherapy (Pulse lavage);Packing (Saline gauze) 09/22/19 0959      Hydrotherapy Pulsed lavage therapy - wound location: sacrum Pulsed Lavage with Suction (psi): 12 psi Pulsed Lavage with Suction - Normal Saline Used: 1000 mL Pulsed Lavage Tip: Tip with splash shield Selective Debridement Selective  Debridement - Location: sacrum Selective Debridement - Tools Used: Forceps;Scissors Selective Debridement - Tissue Removed: yellow/gray slough   Wound Assessment and Plan  Wound Therapy - Assess/Plan/Recommendations Wound Therapy - Clinical Statement: Patient tolerated hydro better today.  Wound still with mostly eschar/fibrinous tissue in base of wound.  Edges of wound with maceration. Wound Therapy - Functional Problem List: decreased mobility Factors Delaying/Impairing Wound Healing: Diabetes Mellitus;Incontinence;Immobility;Multiple medical problems Hydrotherapy Plan: Debridement;Dressing change;Patient/family education;Pulsatile lavage with suction Wound Therapy - Frequency: 6X / week Wound Therapy - Follow Up Recommendations: Other (comment) (LTACH) Wound Plan: see above  Wound Therapy Goals- Improve the function of patient's integumentary system by progressing the wound(s) through the phases of wound healing (inflammation - proliferation - remodeling) by: Decrease Necrotic Tissue to: 20 Decrease Necrotic Tissue - Progress: Progressing toward goal Increase Granulation Tissue to: 80 Increase Granulation Tissue - Progress: Progressing toward goal  Goals will be updated until maximal potential achieved or discharge criteria met.  Discharge criteria: when goals achieved, discharge from hospital, MD decision/surgical intervention, no progress towards goals, refusal/missing three consecutive treatments without notification or medical reason.  GP     Shanna Cisco 09/22/2019, 10:09 AM  09/22/2019 Lesleigh Noe, PT Acute Rehabilitation Services Pager:  [phone] Office:  [phone]

## 2019-09-22 NOTE — Plan of Care (Signed)
  Problem: Clinical Measurements: Goal: Will remain free from infection Outcome: Progressing Goal: Diagnostic test results will improve Outcome: Progressing Goal: Respiratory complications will improve Outcome: Progressing Goal: Cardiovascular complication will be avoided Outcome: Progressing   Problem: Nutrition: Goal: Adequate nutrition will be maintained Outcome: Progressing   Problem: Elimination: Goal: Will not experience complications related to urinary retention Outcome: Progressing   Problem: Cardiac: Goal: Ability to achieve and maintain adequate cardiopulmonary perfusion will improve Outcome: Progressing   Problem: Urinary Elimination: Goal: Ability to achieve and maintain adequate renal perfusion and functioning will improve Outcome: Progressing   Problem: Education: Goal: Knowledge of General Education information will improve Description: Including pain rating scale, medication(s)/side effects and non-pharmacologic comfort measures Outcome: Not Progressing   Problem: Health Behavior/Discharge Planning: Goal: Ability to manage health-related needs will improve Outcome: Not Progressing   Problem: Clinical Measurements: Goal: Ability to maintain clinical measurements within normal limits will improve Outcome: Not Progressing   Problem: Activity: Goal: Risk for activity intolerance will decrease Outcome: Not Progressing   Problem: Coping: Goal: Level of anxiety will decrease Outcome: Not Progressing   Problem: Elimination: Goal: Will not experience complications related to bowel motility Outcome: Not Progressing   Problem: Pain Managment: Goal: General experience of comfort will improve Outcome: Not Progressing   Problem: Safety: Goal: Ability to remain free from injury will improve Outcome: Not Progressing   Problem: Skin Integrity: Goal: Risk for impaired skin integrity will decrease Outcome: Not Progressing   Problem: Education: Goal:  Ability to demonstrate management of disease process will improve Outcome: Not Progressing Goal: Ability to verbalize understanding of medication therapies will improve Outcome: Not Progressing Goal: Individualized Educational Video(s) Outcome: Not Progressing   Problem: Activity: Goal: Capacity to carry out activities will improve Outcome: Not Progressing   Problem: Education: Goal: Will demonstrate proper wound care and an understanding of methods to prevent future damage Outcome: Not Progressing Goal: Knowledge of disease or condition will improve Outcome: Not Progressing Goal: Knowledge of the prescribed therapeutic regimen will improve Outcome: Not Progressing Goal: Individualized Educational Video(s) Outcome: Not Progressing   Problem: Activity: Goal: Risk for activity intolerance will decrease Outcome: Not Progressing   Problem: Cardiac: Goal: Will achieve and/or maintain hemodynamic stability Outcome: Not Progressing   Problem: Clinical Measurements: Goal: Postoperative complications will be avoided or minimized Outcome: Not Progressing   Problem: Respiratory: Goal: Respiratory status will improve Outcome: Not Progressing   Problem: Skin Integrity: Goal: Wound healing without signs and symptoms of infection Outcome: Not Progressing Goal: Risk for impaired skin integrity will decrease Outcome: Not Progressing

## 2019-09-22 NOTE — Progress Notes (Signed)
Pt initially alert and responsive when RN entered the room. Pt began to desat after being suctioned. Respiratory was called to the bedside. Pt began to brady down, became pulseless, and was unresponsive. CPR was initiated and code blue was called. MD was notified and at the bedside. No shocks given. No meds given.

## 2019-09-22 NOTE — Progress Notes (Signed)
NAME:  Todd [name], MRN:  [id number], DOB:  09-Feb-1955, LOS: 4 ADMISSION DATE:  07/26/2019, CONSULTATION DATE:  08/03/19 REFERRING MD:  Roxan Hockey, CHIEF COMPLAINT:  ECMO   Brief History   64 yo male admitted with CHF exacerbation, found to have multi-vessel disease requiring CABG 6/24, post op VT Arrest 6/25 and cannulated for VA ECMO. S/p decannulation and impella.  History of present illness   Presented with worsening dyspnea 6/17 c/w CHF exacerbation.  LHC  with severe triple vessel disease.  Underwent CABG 6/24. Vtach arrest 6/25.  Chest opened bedside and cardiac massage initiated as well as multiple cardioversions, amiodarone, bicarb etc.  Brought to OR and cannulated for VA ECMO.  PCCM consulted to assist with management  Comorbidities include DM, heavy smoking, COPD  Past Medical History  Depression Ischemic cardiomyopathy HTN HLD  Significant Hospital Events   6/24 CABG 6/25 VA cannulation 6/28 decannulated and impella placed 6/29 bedside re-exploration; s/p decannulation. Placement of impella device originally at p8 but decreased to p4 2/2 suction events overnight up to p6. Echo completed and repositioned device. Remained on considerable support with 8 epi and 46 norepi vaso 0.05. continued chest tube output with large clots noted. Heparin thru device. Replacement products ongoing. 60% 8 peep 6/30 bedside mediastinal re-exploration and clean out of hematoma with tamponade and worsening hemodynamics. Pt had large volume transfusion. rebolused with amio 2/2 nsvt episode.  Weight up 48 pounds.  Started diuresis, Lasix drip started. 7/01 iatrogenic respiratory alkalosis, vent rate decreased. 7/02 No significant issues overnight remains on pressors and Impella not tolerating tube feeds with high gastric output awaiting core track placement 7/03 started trickle TF  7/05 Swab removed, CVL and PICC placed. 7/13 Trach and BAL 7/16 Awake and tracking. No follow commands. Still  having fevers. Successful ultrasound and CT guided placement of a 10.2 French cholecystostomy tube. A small amount of aspirated bile was capped and sent to the laboratory for analysis 7/17 Off sedation, PSV.  Beginning to follow some commands occasionally intermittently.  Very jaundiced.  Growing Candida in the blood and BAL > diflucan.   7/21 Ongoing fevers, WBC increased 7/22 Persistent fevers, pan cultured with CT Chest/Head. Suspected source tracheobronchitis.  7/28 Transitioned to eliquis, jaundiced but labs improving  7/29 Agitation/restless.  Trazodone added QHS. Weaning midodrine, attempting diuresis  7/30 Increased Na, weaning on PSV.  8/01 On 40% ATC, significantly improved mental status 8/1>> ongoing attempts at trach weaning complicated by weakness, secretion management, and anxiety 8/8 started back on meropenem and milrinone 8/12 patient making slow improvements, he has been able to tolerate short course ATC   Consults:  Advanced heart failure, PCCM, TCTS  Procedures:  LUE PICC 7/5 >>7/21 R Brachial ALine 7/9 >>7/27 Chest Tubes >> L IJ CVC 7/21 >>7/27 Peg tube 7/27 >>  Significant Diagnostic Tests:  6/22 spirometry with restrictive physiology, preserved FEV1/FVC 6/18 echo: LVEF 78-29%, grade I diastolic dysfunction 5/62 US Abdomen cholelithiasis without cholecystitis. Mild wall thinking in setting of liver disease and ascites.  7/10 LE [place] negative for DVT  7/20 CT ABD w contrast >> stable position of the percutaneous cholecystostomy tube with complete decompression of the gallbladder, trace residual free fluid within the RUQ, decreased since prior, trace bilateral pleural effusions L>R, scattered areas of ground glass airspace disease within the RML, RLL 7/22 CT Chest w/o Contrast >> Retrosternal fluid collection, non specific.  Bilateral pleural effusions, portions loculated.   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 Chronic  microvascular angiopathy. Stable bilateral mastoid and middle ear effusions.   Micro Data:  7/3 RQ> Few candida tropicalis, likely contaminant 7/12 BAL Cx> Candida tropicalis and albicans, likely contaminant 7/14 BCx >> candida parapsilosis 7/17 BCx >> neg resp 8/7 >> stenotrophomonas  Antimicrobials:  Meropenem 8/8 >>8/10 Diflucan  7/16 >> 8/9 vanc 7/22 >>8/10 levaquin 8/10 >>  Interim history/subjective:  Yesterday, weaned off milrinone.  Awake, alert, follows commands. On full vent support overnight. Had nausea overnight treated with zofran.  Tolerated ATC x 1 hour  Objective   Blood pressure (!) 121/59, pulse 64, temperature 97.6 F (36.4 C), temperature source Oral, resp. rate 17, height _0  (1.753 m), weight 76.1 kg, SpO2 100 %.    Vent Mode: PSV;CPAP FiO2 (%):  [40 %] 40 % Set Rate:  [14 bmp] 14 bmp Vt Set:  [420 mL] 420 mL PEEP:  [5 cmH20-12 cmH20] 5 cmH20 Pressure Support:  [5 cmH20] 5 cmH20 Plateau Pressure:  [18 cmH20-22 cmH20] 22 cmH20   Intake/Output Summary (Last 24 hours) at 09/22/2019 0815 Last data filed at 09/22/2019 0400 Gross per 24 hour  Intake 770 ml  Output 3240 ml  Net -2470 ml   Filed Weights   09/20/19 0500 09/21/19 0356 09/22/19 0352  Weight: 79.4 kg 77.9 kg 76.1 kg   Physical Exam: General: Appears older than stated age, chronically ill-appearing, no acute distress HENT: [place], AT, OP clear, MMM Neck: Trach in place, c/d/i Eyes: EOMI, no scleral icterus Respiratory: Clear to auscultation bilaterally.  No crackles, wheezing or rales Cardiovascular: RRR, -M/R/G, no JVD GI: BS+, soft, nontender, s/p PEG, RUQ drain with clear serous fluid Extremities:-Edema,-tenderness Neuro: Awake, alert and follows simple commands  SVO2 67%  Lab Results  Component Value Date   WBC 14.4 (H) 09/22/2019   HGB 8.1 (L) 09/22/2019   HCT 28.3 (L) 09/22/2019   MCV 99.0 09/22/2019   PLT 246 09/22/2019   Lab Results  Component Value Date   CREATININE  1.11 09/22/2019   BUN 146 (H) 09/22/2019   NA 139 09/22/2019   K 4.6 09/22/2019   CL 98 09/22/2019   CO2 33 (H) 09/22/2019   Resolved Hospital Problem list   Suspected HCAP Acute kidney injury, improved  Hypernatremia, improved  Acute Metabolic Encephalopathy  -CTH without acute intracranial pathology. EEG negative 7/23.  Shock Heparin-induced thrombocytopenia >> resolved, required bivalirudin 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 -Opthalmology evaluated , diflucan x 3 weeks  Assessment & Plan:   Acute hypoxic respiratory failure w/ need for mechanical ventilation  - status post tracheostomy - Poor lung mechanics due to weakness, deconditioning  8/8: Worsening related to stenotrophomonas HAP P: On Levaquin Day 5 of 14 for pneumonia Daily wean to ATC. Currently tolerating 1h. Expect slow wean Routine trach care Aggressive PT/OT therapies Encourage frequent pulmonary hygiene Continue chest PT  Cardiogenic/hemorrhagic shock status post CABG complicated by VT arrest, improved S/p VA ECMO (decanulated 6/28).  S/p impella placement (6/28> 7/9) P: Off milrinone. Repeat co-ox appropriate Trend coox Primary management per heart failure and TCTS Follow strict intake and output. 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, resolved  P: Given arixtra x 1 for DVT however discontinued for bleeding at  mediastinal site Will reassess in 48 hours to restart  Acute kidney injury, resolved Hypernatremia, resolved P: Trend bmet/urine output Avoid nephrotoxins Diuretic holiday  Anxiety -likely ICU related stress disorder P: Continue Prozac, Seroquel nightly, and Klonopin In line Speech valve  Sacral Decubitus  P: Hydrotherapy per PT WIC following Frequent turns Pressure alleviating devices Ensure adequate pain control  Deconditioning of Critical Illness  P: Continue PT/OT efforts  Small left adrenal mass seen on CT scan of the abdomen August 24, 2019 Patient need to follow-up in the outpatient setting  Daily Goals Checklist  Pain/Anxiety/Delirium protocol (if indicated): see above  VAP protocol (if indicated): bundle in place.  DVT prophylaxis: eliquis Nutrition Status: TF  GI prophylaxis: PPI Urinary catheter: n/a Central lines: removed Glucose control: See above Mobility/therapy needs: PT for ROM  Code Status: Full  Family Communication: per primary Disposition: ICU -- await LTACH  The patient is critically ill with multiple organ systems failure and requires high complexity decision making for assessment and support, frequent evaluation and titration of therapies, application of advanced monitoring technologies and extensive interpretation of multiple databases.  Independent Critical Care Time: 40 Minutes.   Rodman Pickle, M.D. [facility] Pulmonary/Critical Care Medicine 09/22/2019 8:15 AM   Please see Amion for pager number to reach on-call Pulmonary and Critical Care Team.

## 2019-09-22 NOTE — Progress Notes (Signed)
Per Dr. Everardo All please hold off on pts scheduled trach change until Monday 8/16 when more staffing is available due to potential difficulty. RT will continue to monitor.

## 2019-09-22 NOTE — Progress Notes (Signed)
      [address]       [place] [address]             [phone]                 36 Days Post-Op Procedure(s) (LRB): REMOVAL OF IMPELLA LEFT VENTRICULAR ASSIST DEVICE (N/A) TRANSESOPHAGEAL ECHOCARDIOGRAM (TEE) (N/A)   Events: No events. Some nausea overnight _______________________________________________________________ Vitals: BP (!) 119/58   Pulse 63   Temp 97.6 F (36.4 C) (Axillary)   Resp 16   Ht 5\' 9"  (1.753 m)   Wt 76.1 kg   SpO2 100%   BMI 24.78 kg/m   - Neuro: arousable, following commands  - Cardiovascular: regular  Drips: none.      - Pulm: Trached.  EWOB Vent Mode: PRVC FiO2 (%):  [40 %] 40 % Set Rate:  [14 bmp] 14 bmp Vt Set:  [420 mL] 420 mL PEEP:  [8 cmH20-12 cmH20] 8 cmH20 Plateau Pressure:  [18 cmH20-22 cmH20] 22 cmH20  ABG    Component Value Date/Time   PHART 7.348 (L) 09/16/2019 1714   PCO2ART 58.2 (H) 09/16/2019 1714   PO2ART 65 (L) 09/16/2019 1714   HCO3 32.0 (H) 09/16/2019 1714   TCO2 34 (H) 09/16/2019 1714   ACIDBASEDEF 1.0 08/08/2019 1115   O2SAT 67.2 09/22/2019 0412    - Abd: soft - Extremity: warm  .Intake/Output      08/13 0701 - 08/14 0700   Other 140   NG/GT 700   Total Intake(mL/kg) 840 (11)   Urine (mL/kg/hr) 2165 (1.2)   Drains 675   Stool 400   Total Output 3240   Net -2400       Urine Occurrence 1 x   Stool Occurrence 1 x      _______________________________________________________________ Labs: CBC Latest Ref Rng & Units 09/22/2019 09/21/2019 09/20/2019  WBC 4.0 - 10.5 K/uL 14.4(H) 13.1(H) 11.6(H)  Hemoglobin 13.0 - 17.0 g/dL 8.1(L) 7.2(L) 7.5(L)  Hematocrit 39 - 52 % 28.3(L) 25.1(L) 25.1(L)  Platelets 150 - 400 K/uL 246 224 219   CMP Latest Ref Rng & Units 09/22/2019 09/21/2019 09/20/2019  Glucose 70 - 99 mg/dL 11/20/2019) 373(S) 287(G)  BUN 8 - 23 mg/dL 811(X) 726(O) 035(D)  Creatinine 0.61 - 1.24 mg/dL 974(B 6.38) 4.53(M)  Sodium 135 - 145 mmol/L 139 137 135  Potassium 3.5 - 5.1  mmol/L 4.6 4.8 4.3  Chloride 98 - 111 mmol/L 98 95(L) 94(L)  CO2 22 - 32 mmol/L 33(H) 33(H) 31  Calcium 8.9 - 10.3 mg/dL 8.3(L) 8.2(L) 8.1(L)  Total Protein 6.5 - 8.1 g/dL - 5.6(L) 5.5(L)  Total Bilirubin 0.3 - 1.2 mg/dL - 6.0(H) 6.4(H)  Alkaline Phos 38 - 126 U/L - 801(H) 809(H)  AST 15 - 41 U/L - 121(H) 147(H)  ALT 0 - 44 U/L - 121(H) 128(H)    CXR: Stable. Bilateral infiltrates  _______________________________________________________________  Assessment and Plan:  s/p CABG on 6/24, VA ecmo, impella  Neuro: pain controlled.  mentating well CV: off milr.  Co-Ox 67 from 71 this am.  Good hemodynamics Pulm: vent wean as tolerated.  CXR slightly improved Renal: creat stable 1.1 today.  Good uop GI: on tube feeds.  Held overnight for nausea.  Can resume Heme: stable.  plts recovered.   ID: on levaquin for pneumonia.  WBC 14 Endo: SSI Dispo: continue ICU care.  7/24, MD 09/22/2019 6:52 AM

## 2019-09-23 DIAGNOSIS — B377 Candidal sepsis: Secondary | ICD-10-CM | POA: Diagnosis not present

## 2019-09-23 LAB — CBC
HCT: 27.1 % — ABNORMAL LOW (ref 39.0–52.0)
Hemoglobin: 8 g/dL — ABNORMAL LOW (ref 13.0–17.0)
MCH: 29.3 pg (ref 26.0–34.0)
MCHC: 29.5 g/dL — ABNORMAL LOW (ref 30.0–36.0)
MCV: 99.3 fL (ref 80.0–100.0)
Platelets: 257 10*3/uL (ref 150–400)
RBC: 2.73 MIL/uL — ABNORMAL LOW (ref 4.22–5.81)
RDW: 17.6 % — ABNORMAL HIGH (ref 11.5–15.5)
WBC: 15.8 10*3/uL — ABNORMAL HIGH (ref 4.0–10.5)
nRBC: 0 % (ref 0.0–0.2)

## 2019-09-23 LAB — COOXEMETRY PANEL
Carboxyhemoglobin: 1.8 % — ABNORMAL HIGH (ref 0.5–1.5)
Methemoglobin: 1 % (ref 0.0–1.5)
O2 Saturation: 72 %
Total hemoglobin: 11.7 g/dL — ABNORMAL LOW (ref 12.0–16.0)

## 2019-09-23 LAB — GLUCOSE, CAPILLARY
Glucose-Capillary: 139 mg/dL — ABNORMAL HIGH (ref 70–99)
Glucose-Capillary: 155 mg/dL — ABNORMAL HIGH (ref 70–99)
Glucose-Capillary: 155 mg/dL — ABNORMAL HIGH (ref 70–99)
Glucose-Capillary: 162 mg/dL — ABNORMAL HIGH (ref 70–99)
Glucose-Capillary: 169 mg/dL — ABNORMAL HIGH (ref 70–99)
Glucose-Capillary: 174 mg/dL — ABNORMAL HIGH (ref 70–99)

## 2019-09-23 LAB — COMPREHENSIVE METABOLIC PANEL
ALT: 118 U/L — ABNORMAL HIGH (ref 0–44)
AST: 114 U/L — ABNORMAL HIGH (ref 15–41)
Albumin: 1.4 g/dL — ABNORMAL LOW (ref 3.5–5.0)
Alkaline Phosphatase: 902 U/L — ABNORMAL HIGH (ref 38–126)
Anion gap: 5 (ref 5–15)
BUN: 138 mg/dL — ABNORMAL HIGH (ref 8–23)
CO2: 36 mmol/L — ABNORMAL HIGH (ref 22–32)
Calcium: 8.3 mg/dL — ABNORMAL LOW (ref 8.9–10.3)
Chloride: 100 mmol/L (ref 98–111)
Creatinine, Ser: 1.04 mg/dL (ref 0.61–1.24)
GFR calc Af Amer: >60 mL/min (ref >60)
GFR calc non Af Amer: >60 mL/min (ref >60)
Glucose, Bld: 179 mg/dL — ABNORMAL HIGH (ref 70–99)
Potassium: 4.8 mmol/L (ref 3.5–5.1)
Sodium: 141 mmol/L (ref 135–145)
Total Bilirubin: 5.6 mg/dL — ABNORMAL HIGH (ref 0.3–1.2)
Total Protein: 5.4 g/dL — ABNORMAL LOW (ref 6.5–8.1)

## 2019-09-23 MED FILL — Medication: Qty: 1 | Status: AC

## 2019-09-23 NOTE — Progress Notes (Signed)
      [address]       [place] [address]             [phone]                 37 Days Post-Op Procedure(s) (LRB): REMOVAL OF IMPELLA LEFT VENTRICULAR ASSIST DEVICE (N/A) TRANSESOPHAGEAL ECHOCARDIOGRAM (TEE) (N/A)   Events: Respiratory code last night.  ACLS for Stable mentation ___________________________________________ Vitals: BP (!) 102/50   Pulse 66   Temp (!) 97 F (36.1 C) (Axillary)   Resp 20   Ht 5\' 9"  (1.753 m)   Wt 77.4 kg   SpO2 100%   BMI 25.20 kg/m   - Neuro: arousable, following commands  - Cardiovascular: regular  Drips: none.      - Pulm: Trached.  EWOB Vent Mode: PRVC FiO2 (%):  [30 %-50 %] 50 % Set Rate:  [14 bmp-20 bmp] 20 bmp Vt Set:  [420 mL] 420 mL PEEP:  [5 cmH20] 5 cmH20 Pressure Support:  [5 cmH20] 5 cmH20 Plateau Pressure:  [16 cmH20-20 cmH20] 18 cmH20  ABG    Component Value Date/Time   PHART 7.366 09/22/2019 2147   PCO2ART 68.3 (HH) 09/22/2019 2147   PO2ART 76 (L) 09/22/2019 2147   HCO3 39.3 (H) 09/22/2019 2147   TCO2 41 (H) 09/22/2019 2147   ACIDBASEDEF 1.0 08/08/2019 1115   O2SAT 72.0 09/23/2019 0342    - Abd: soft - Extremity: warm  .Intake/Output      08/14 0701 - 08/15 0700   I.V. (mL/kg) 438 (5.7)   Other 10   NG/GT 2055.8   IV Piggyback 300   Total Intake(mL/kg) 2803.8 (36.2)   Urine (mL/kg/hr) 2285 (1.2)   Drains 600   Total Output 2885   Net -81.2          _______________________________________________________________ Labs: CBC Latest Ref Rng & Units 09/23/2019 09/22/2019 09/22/2019  WBC 4.0 - 10.5 K/uL 15.8(H) - 15.3(H)  Hemoglobin 13.0 - 17.0 g/dL 8.0(L) 9.9(L) 8.9(L)  Hematocrit 39 - 52 % 27.1(L) 29.0(L) 31.5(L)  Platelets 150 - 400 K/uL 257 - 278   CMP Latest Ref Rng & Units 09/23/2019 09/22/2019 09/22/2019  Glucose 70 - 99 mg/dL 09/24/2019) - 415(A)  BUN 8 - 23 mg/dL 309(M) - 076(K)  Creatinine 0.61 - 1.24 mg/dL 088(P - 1.03  Sodium 1.59 - 145 mmol/L 141 144 142  Potassium  3.5 - 5.1 mmol/L 4.8 4.9 5.0  Chloride 98 - 111 mmol/L 100 - 99  CO2 22 - 32 mmol/L 36(H) - 35(H)  Calcium 8.9 - 10.3 mg/dL 8.3(L) - 8.6(L)  Total Protein 6.5 - 8.1 g/dL 458) - 6.4(L)  Total Bilirubin 0.3 - 1.2 mg/dL 5.6(H) - 6.4(H)  Alkaline Phos 38 - 126 U/L 902(H) - 919(H)  AST 15 - 41 U/L 114(H) - 131(H)  ALT 0 - 44 U/L 118(H) - 132(H)    CXR: Stable. Bilateral infiltrates  _______________________________________________________________  Assessment and Plan:  s/p CABG on 6/24, VA ecmo, impella  Neuro: pain controlled.  mentating well CV: Good hemodynamics Pulm: continue pulmonary toilet.  Mucus plug cleared.  vent wean as tolerated.  Renal: creat stable 1.1 today.  Good uop GI: on tube feeds.  Held overnight for nausea.  Can resume Heme: stable.  plts recovered.   ID: on levaquin for pneumonia.  WBC 15.8 Endo: SSI Dispo: continue ICU care.  7/24, MD 09/23/2019 6:33 AM

## 2019-09-23 NOTE — Progress Notes (Signed)
No CPT at this time due to pain in pt's chest. CCM aware, lavage and suction performed x3 times instead.

## 2019-09-23 NOTE — Progress Notes (Addendum)
NAME:  Todd [name], MRN:  [id number], DOB:  1955/04/29, LOS: 72 ADMISSION DATE:  07/26/2019, CONSULTATION DATE:  08/03/19 REFERRING MD:  Roxan Hockey, CHIEF COMPLAINT:  ECMO   Brief History   64 yo male admitted with CHF exacerbation, found to have multi-vessel disease requiring CABG 6/24, post op VT Arrest 6/25 and cannulated for VA ECMO. S/p decannulation and impella.  History of present illness   Presented with worsening dyspnea 6/17 c/w CHF exacerbation.  LHC  with severe triple vessel disease.  Underwent CABG 6/24. Vtach arrest 6/25.  Chest opened bedside and cardiac massage initiated as well as multiple cardioversions, amiodarone, bicarb etc.  Brought to OR and cannulated for VA ECMO.  PCCM consulted to assist with management  Comorbidities include DM, heavy smoking, COPD  Past Medical History  Depression Ischemic cardiomyopathy HTN HLD  Significant Hospital Events   6/24 CABG 6/25 VA cannulation 6/28 decannulated and impella placed 6/29 bedside re-exploration; s/p decannulation. Placement of impella device originally at p8 but decreased to p4 2/2 suction events overnight up to p6. Echo completed and repositioned device. Remained on considerable support with 8 epi and 46 norepi vaso 0.05. continued chest tube output with large clots noted. Heparin thru device. Replacement products ongoing. 60% 8 peep 6/30 bedside mediastinal re-exploration and clean out of hematoma with tamponade and worsening hemodynamics. Pt had large volume transfusion. rebolused with amio 2/2 nsvt episode.  Weight up 48 pounds.  Started diuresis, Lasix drip started. 7/01 iatrogenic respiratory alkalosis, vent rate decreased. 7/02 No significant issues overnight remains on pressors and Impella not tolerating tube feeds with high gastric output awaiting core track placement 7/03 started trickle TF  7/05 Swab removed, CVL and PICC placed. 7/13 Trach and BAL 7/16 Awake and tracking. No follow commands. Still  having fevers. Successful ultrasound and CT guided placement of a 10.2 French cholecystostomy tube. A small amount of aspirated bile was capped and sent to the laboratory for analysis 7/17 Off sedation, PSV.  Beginning to follow some commands occasionally intermittently.  Very jaundiced.  Growing Candida in the blood and BAL > diflucan.   7/21 Ongoing fevers, WBC increased 7/22 Persistent fevers, pan cultured with CT Chest/Head. Suspected source tracheobronchitis.  7/28 Transitioned to eliquis, jaundiced but labs improving  7/29 Agitation/restless.  Trazodone added QHS. Weaning midodrine, attempting diuresis  7/30 Increased Na, weaning on PSV.  8/01 On 40% ATC, significantly improved mental status 8/1>> ongoing attempts at trach weaning complicated by weakness, secretion management, and anxiety 8/8 started back on meropenem and milrinone 8/12 patient making slow improvements, he has been able to tolerate short course ATC 8/13 Weaned off milrinone 8/14 Code blue called for hypoxemia leading to PEA arrest. ROSC achieved after 6 min   Consults:  Advanced heart failure, PCCM, TCTS  Procedures:  LUE PICC 7/5 >>7/21 R Brachial ALine 7/9 >>7/27 Chest Tubes >> L IJ CVC 7/21 >>7/27 Peg tube 7/27 >>  Significant Diagnostic Tests:  6/22 spirometry with restrictive physiology, preserved FEV1/FVC 6/18 echo: LVEF 10-62%, grade I diastolic dysfunction 6/94 US Abdomen cholelithiasis without cholecystitis. Mild wall thinking in setting of liver disease and ascites.  7/10 LE [place] negative for DVT  7/20 CT ABD w contrast >> stable position of the percutaneous cholecystostomy tube with complete decompression of the gallbladder, trace residual free fluid within the RUQ, decreased since prior, trace bilateral pleural effusions L>R, scattered areas of ground glass airspace disease within the RML, RLL 7/22 CT Chest w/o Contrast >> Retrosternal fluid collection, non  specific.  Bilateral pleural effusions,  portions loculated.   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 Stable bilateral mastoid and middle ear effusions.   Micro Data:  7/3 RQ> Few candida tropicalis, likely contaminant 7/12 BAL Cx> Candida tropicalis and albicans, likely contaminant 7/14 BCx >> candida parapsilosis 7/17 BCx >> neg resp 8/7 >> stenotrophomonas  Antimicrobials:  Meropenem 8/8 >>8/10 Diflucan  7/16 >> 8/9 vanc 7/22 >>8/10 levaquin 8/10 >>  Interim history/subjective:  Overnight, patient had PEA arrest preceded by hypoxemia when being suctioned. Received 75mn CPR before achieving ROSC.  This morning patient is unfazed from his recent events and continues asking for water to drink. Awake, alert and follows commands.  Objective   Blood pressure (!) 111/58, pulse 75, temperature 98.3 F (36.8 C), temperature source Oral, resp. rate 18, height _0  (1.753 m), weight 77.4 kg, SpO2 100 %.    Vent Mode: PSV;CPAP FiO2 (%):  [30 %-50 %] 40 % Set Rate:  [14 bmp-20 bmp] 20 bmp Vt Set:  [420 mL] 420 mL PEEP:  [5 cmH20] 5 cmH20 Pressure Support:  [8 cmH20] 8 cmH20 Plateau Pressure:  [16 cmH20-20 cmH20] 18 cmH20   Intake/Output Summary (Last 24 hours) at 09/23/2019 1712 Last data filed at 09/23/2019 1500 Gross per 24 hour  Intake 2298.67 ml  Output 2975 ml  Net -676.33 ml   Filed Weights   09/21/19 0356 09/22/19 0352 09/23/19 0600  Weight: 77.9 kg 76.1 kg 77.4 kg   Physical Exam: General: Appears older than stated age, chronically ill-appearing, no acute distress HENT: [place], AT, OP clear, MMM Neck: Trach in place, c/d/i Eyes: EOMI, no scleral icterus Respiratory: Clear to auscultation bilaterally.  No crackles, wheezing or rales Cardiovascular: RRR, -M/R/G, no JVD GI: BS+, soft, nontender, s/p PEG, RUQ drain with clear serous fluid Extremities:-Edema,-tenderness Neuro: Awake, alert and follows simple commands  SVO2 72%  CBC  Latest Ref Rng & Units 09/23/2019 09/22/2019 09/22/2019  WBC 4.0 - 10.5 K/uL 15.8(H) - 15.3(H)  Hemoglobin 13.0 - 17.0 g/dL 8.0(L) 9.9(L) 8.9(L)  Hematocrit 39 - 52 % 27.1(L) 29.0(L) 31.5(L)  Platelets 150 - 400 K/uL 257 - 278   CMP Latest Ref Rng & Units 09/23/2019 09/22/2019 09/22/2019  Glucose 70 - 99 mg/dL 179(H) - 187(H)  BUN 8 - 23 mg/dL 138(H) - 137(H)  Creatinine 0.61 - 1.24 mg/dL 1.04 - 0.99  Sodium 135 - 145 mmol/L 141 144 142  Potassium 3.5 - 5.1 mmol/L 4.8 4.9 5.0  Chloride 98 - 111 mmol/L 100 - 99  CO2 22 - 32 mmol/L 36(H) - 35(H)  Calcium 8.9 - 10.3 mg/dL 8.3(L) - 8.6(L)  Total Protein 6.5 - 8.1 g/dL 5.4(L) - 6.4(L)  Total Bilirubin 0.3 - 1.2 mg/dL 5.6(H) - 6.4(H)  Alkaline Phos 38 - 126 U/L 902(H) - 919(H)  AST 15 - 41 U/L 114(H) - 131(H)  ALT 0 - 44 U/L 118(H) - 132(H)   CXR 8/14 Bilateral infiltrates with improvement of right side compared to prior. Trach, PICC line in place. Sternal wires Imaging, labs and test noted above have been reviewed independently by me.   Resolved Hospital Problem list   Suspected HCAP Acute kidney injury Hypernatremia Acute Metabolic Encephalopathy  -CTH without acute intracranial pathology. EEG negative 7/23.  Shock Heparin-induced thrombocytopenia >> resolved, required bivalirudin 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 -Opthalmology evaluated , diflucan x 3 weeks  Assessment & Plan:   Acute hypoxic respiratory failure  w/ need for mechanical ventilation  - status post tracheostomy - Poor lung mechanics due to weakness, deconditioning  8/8: Worsening related to stenotrophomonas HAP PEA arrest 8/14 preceded by hypoxemia P: On Levaquin Day 6 of 14 for pneumonia Remain on full vent today. Previously tolerating 1h/day. Expect slow wean Restart weaning trials tomorrow Routine trach care Aggressive PT/OT therapies Encourage frequent pulmonary hygiene Continue chest PT  Cardiogenic/hemorrhagic shock status post CABG  complicated by VT arrest,  S/p VA ECMO (decanulated 6/28).  S/p impella placement (6/28> 7/9) PEA arrest 8/14 preceded by hypoxemia P: Primary management per heart failure and TCTS Trend coox Follow strict intake and output. 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, resolved  Hg stable in last 72 hours P: Given arixtra x 1 for DVT however discontinued for bleeding at mediastinal site Consider restarting on Monday  Anxiety -likely ICU related stress disorder P: Continue Prozac, Seroquel nightly, and Klonopin In line Speech valve  Sacral Decubitus  P: Hydrotherapy per PT WIC following Frequent turns Pressure alleviating devices Ensure adequate pain control  Deconditioning of Critical Illness  P: Continue PT/OT efforts  Small left adrenal mass seen on CT scan of the abdomen August 24, 2019 Patient need to follow-up in the outpatient setting  Daily Goals Checklist  Pain/Anxiety/Delirium protocol (if indicated): see above  VAP protocol (if indicated): bundle in place.  DVT prophylaxis: eliquis Nutrition Status: TF  GI prophylaxis: PPI Urinary catheter: n/a Central lines: removed Glucose control: See above Mobility/therapy needs: PT for ROM  Code Status: Full  Family Communication: per primary Disposition: ICU -- await LTACH  The patient is critically ill with multiple organ systems failure and requires high complexity decision making for assessment and support, frequent evaluation and  titration of therapies, application of advanced monitoring technologies and extensive interpretation of multiple databases.  Independent Critical Care Time: 32 Minutes.   Rodman Pickle, M.D. [facility] Pulmonary/Critical Care Medicine 09/23/2019 5:12 PM   Please see Amion for pager number to reach on-call Pulmonary and Critical Care Team.

## 2019-09-24 DIAGNOSIS — J80 Acute respiratory distress syndrome: Secondary | ICD-10-CM | POA: Diagnosis not present

## 2019-09-24 DIAGNOSIS — I5021 Acute systolic (congestive) heart failure: Secondary | ICD-10-CM | POA: Diagnosis not present

## 2019-09-24 LAB — CBC
HCT: 26.9 % — ABNORMAL LOW (ref 39.0–52.0)
Hemoglobin: 7.7 g/dL — ABNORMAL LOW (ref 13.0–17.0)
MCH: 28.3 pg (ref 26.0–34.0)
MCHC: 28.6 g/dL — ABNORMAL LOW (ref 30.0–36.0)
MCV: 98.9 fL (ref 80.0–100.0)
Platelets: 256 10*3/uL (ref 150–400)
RBC: 2.72 MIL/uL — ABNORMAL LOW (ref 4.22–5.81)
RDW: 17.8 % — ABNORMAL HIGH (ref 11.5–15.5)
WBC: 15 10*3/uL — ABNORMAL HIGH (ref 4.0–10.5)
nRBC: 0 % (ref 0.0–0.2)

## 2019-09-24 LAB — COMPREHENSIVE METABOLIC PANEL
ALT: 112 U/L — ABNORMAL HIGH (ref 0–44)
AST: 113 U/L — ABNORMAL HIGH (ref 15–41)
Albumin: 1.3 g/dL — ABNORMAL LOW (ref 3.5–5.0)
Alkaline Phosphatase: 927 U/L — ABNORMAL HIGH (ref 38–126)
Anion gap: 7 (ref 5–15)
BUN: 120 mg/dL — ABNORMAL HIGH (ref 8–23)
CO2: 36 mmol/L — ABNORMAL HIGH (ref 22–32)
Calcium: 8.3 mg/dL — ABNORMAL LOW (ref 8.9–10.3)
Chloride: 102 mmol/L (ref 98–111)
Creatinine, Ser: 0.98 mg/dL (ref 0.61–1.24)
GFR calc Af Amer: >60 mL/min (ref >60)
GFR calc non Af Amer: >60 mL/min (ref >60)
Glucose, Bld: 128 mg/dL — ABNORMAL HIGH (ref 70–99)
Potassium: 5 mmol/L (ref 3.5–5.1)
Sodium: 145 mmol/L (ref 135–145)
Total Bilirubin: 5.5 mg/dL — ABNORMAL HIGH (ref 0.3–1.2)
Total Protein: 5.7 g/dL — ABNORMAL LOW (ref 6.5–8.1)

## 2019-09-24 LAB — MAGNESIUM: Magnesium: 3.5 mg/dL — ABNORMAL HIGH (ref 1.7–2.4)

## 2019-09-24 LAB — GLUCOSE, CAPILLARY
Glucose-Capillary: 108 mg/dL — ABNORMAL HIGH (ref 70–99)
Glucose-Capillary: 115 mg/dL — ABNORMAL HIGH (ref 70–99)
Glucose-Capillary: 133 mg/dL — ABNORMAL HIGH (ref 70–99)
Glucose-Capillary: 141 mg/dL — ABNORMAL HIGH (ref 70–99)
Glucose-Capillary: 142 mg/dL — ABNORMAL HIGH (ref 70–99)
Glucose-Capillary: 158 mg/dL — ABNORMAL HIGH (ref 70–99)

## 2019-09-24 LAB — COOXEMETRY PANEL
Carboxyhemoglobin: 2 % — ABNORMAL HIGH (ref 0.5–1.5)
Methemoglobin: 1.1 % (ref 0.0–1.5)
O2 Saturation: 67.9 %
Total hemoglobin: 8.4 g/dL — ABNORMAL LOW (ref 12.0–16.0)

## 2019-09-24 MED ORDER — THIAMINE HCL 100 MG PO TABS
100.0000 mg | ORAL_TABLET | Freq: Every day | ORAL | Status: DC
  Administered 2019-09-25 – 2019-10-09 (×15): 100 mg
  Filled 2019-09-24 (×15): qty 1

## 2019-09-24 MED ORDER — FONDAPARINUX SODIUM 2.5 MG/0.5ML ~~LOC~~ SOLN
2.5000 mg | Freq: Every day | SUBCUTANEOUS | Status: DC
  Administered 2019-09-24 – 2019-09-28 (×5): 2.5 mg via SUBCUTANEOUS
  Filled 2019-09-24 (×5): qty 0.5

## 2019-09-24 MED ORDER — FUROSEMIDE 10 MG/ML IJ SOLN
40.0000 mg | Freq: Once | INTRAMUSCULAR | Status: AC
  Administered 2019-09-24: 40 mg via INTRAVENOUS
  Filled 2019-09-24: qty 4

## 2019-09-24 NOTE — Progress Notes (Signed)
CPT held at this time due to patient request.  

## 2019-09-24 NOTE — Consult Note (Signed)
[facility] Surgery Consult Note  Todd [name] [birth year]  [id number].    Requesting MD: Marni Griffon NP Chief Complaint/Reason for Consult: sacral wound  HPI:  Todd [name] is a 64yo male PMH DM, tobacco abuse, COPD, HTN, and HLD who was admitted 07/26/19 with CHF exacerbation. He was found to have multi-vessel disease requiring CABG 08/02/19. He has had complicated postoperative course including VT arrest 6/25 and cannulated for VA ECMO; now s/p decannulation and impella. 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 He is s/p tracheostomy and remains on the ventilator, ultimately looking towards LTACH when medically stable for discharge.  During admission he was noted to have a sacral wound. He has been undergoing hydrotherapy 6 times per week. General surgery asked to evaluate.   Review of Systems  Unable to perform ROS: Intubated   All systems reviewed and otherwise negative except for as above  Family History  Problem Relation Age of Onset   Heart disease Mother    Hypertension Mother     Past Medical History:  Diagnosis Date   Arthritis    CHF (congestive heart failure) ([place])    Depression    Diabetes mellitus without complication ([place])    Hard of hearing    Hyperlipidemia    Hypertension     Past Surgical History:  Procedure Laterality Date   APPENDECTOMY  9741   APPLICATION OF WOUND VAC N/A 08/10/2019   Procedure: APPLICATION OF WOUND VAC;  Surgeon: Wonda Olds, MD;  [facility];  Service: Thoracic;  Laterality: N/A;  Prevena dressing to wound vac   CANNULATION FOR ECMO (EXTRACORPOREAL MEMBRANE OXYGENATION) N/A 08/03/2019   Procedure: CANNULATION FOR ECMO (EXTRACORPOREAL MEMBRANE OXYGENATION);  Surgeon: Melrose Nakayama, MD;  Location: [place];  Service: Open Heart Surgery;  Laterality: N/A;    CANNULATION FOR ECMO (EXTRACORPOREAL MEMBRANE OXYGENATION) N/A 08/06/2019   Procedure: DECANNULATION FOR ECMO (EXTRACORPOREAL MEMBRANE OXYGENATION);  Surgeon: Wonda Olds, MD;  Location: [place];  Service: Open Heart Surgery;  Laterality: N/A;   CAROTID ENDARTERECTOMY  2010   CORONARY ARTERY BYPASS GRAFT N/A 08/02/2019   Procedure: CORONARY ARTERY BYPASS GRAFTING (CABG), ON PUMP, TIMES FOUR, USING LEFT INTERNAL MAMMARY, LEFT RADIAL ARTERY, AND ENDOSCOPICALLY HARVESTED RIGHT GREATER SAPHENOUS VEIN;  Surgeon: Melrose Nakayama, MD;  Location: [place];  Service: Open Heart Surgery;  Laterality: N/A;   EXPLORATION POST OPERATIVE OPEN HEART N/A 08/07/2019   Procedure: MEDIASTINAL EXPLORATION POST OPERATIVE OPEN HEART IN 2H02;  Surgeon: Wonda Olds, MD;  Location: [place];  Service: Open Heart Surgery;  Laterality: N/A;  Bedside procedure 2H02/emergency   IR GASTROSTOMY TUBE MOD SED  09/04/2019   PLACEMENT OF IMPELLA LEFT VENTRICULAR ASSIST DEVICE Right 08/06/2019   Procedure: PLACEMENT OF IMPELLA LEFT VENTRICULAR ASSIST DEVICE;  Surgeon: Wonda Olds, MD;  Location: [place];  Service: Open Heart Surgery;  Laterality: Right;   RADIAL ARTERY HARVEST Left 08/02/2019   Procedure: LEFT RADIAL ARTERY HARVEST;  Surgeon: Melrose Nakayama, MD;  Location: [place];  Service: Open Heart Surgery;  Laterality: Left;   REMOVAL OF IMPELLA LEFT VENTRICULAR ASSIST DEVICE N/A 08/17/2019   Procedure: REMOVAL OF [place] LEFT VENTRICULAR ASSIST DEVICE;  Surgeon: Melrose Nakayama, MD;  Location: [place];  Service: Open Heart Surgery;  Laterality: N/A;   RIGHT/LEFT HEART CATH AND CORONARY ANGIOGRAPHY N/A 07/30/2019   Procedure: RIGHT/LEFT HEART  CATH AND CORONARY ANGIOGRAPHY;  Surgeon: Burnell Blanks, MD;  Location: [place] [facility];  Service: Cardiovascular;  Laterality: N/A;   STERNAL CLOSURE N/A 08/10/2019   Procedure: STERNAL CLOSURE;  Surgeon: Wonda Olds, MD;  [facility];  Service:  Thoracic;  Laterality: N/A;   TEE WITHOUT CARDIOVERSION N/A 08/02/2019   Procedure: TRANSESOPHAGEAL ECHOCARDIOGRAM (TEE);  Surgeon: Melrose Nakayama, MD;  Location: [place];  Service: Open Heart Surgery;  Laterality: N/A;   TEE WITHOUT CARDIOVERSION N/A 08/06/2019   Procedure: TRANSESOPHAGEAL ECHOCARDIOGRAM (TEE);  Surgeon: Wonda Olds, MD;  Location: [place];  Service: Open Heart Surgery;  Laterality: N/A;   TEE WITHOUT CARDIOVERSION N/A 08/10/2019   Procedure: TRANSESOPHAGEAL ECHOCARDIOGRAM (TEE);  Surgeon: Wonda Olds, MD;  Location: [facility] OR;  Service: Thoracic;  Laterality: N/A;   TEE WITHOUT CARDIOVERSION N/A 08/17/2019   Procedure: TRANSESOPHAGEAL ECHOCARDIOGRAM (TEE);  Surgeon: Melrose Nakayama, MD;  Location: [place];  Service: Open Heart Surgery;  Laterality: N/A;   WOUND DEBRIDEMENT N/A 08/03/2019   Procedure: DEBRIDEMENT WOUND;  Surgeon: Melrose Nakayama, MD;  Location: [name] OR;  Service: Vascular;  Laterality: N/A;    Social History:  reports that he has been smoking cigarettes. He has been smoking about 2.50 packs per day. He has never used smokeless tobacco. He reports current drug use. Frequency: 7.00 times per week. Drug: Marijuana. No history on file for alcohol use.  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     dapagliflozin propanediol (FARXIGA) 10 MG TABS tablet Take 10 mg by mouth daily.     ergocalciferol (VITAMIN D2) 1.25 MG (50000 UT) capsule Take 50,000 Units by mouth 2 (two) times a week.     furosemide (LASIX) 20 MG tablet Take 40 mg by mouth daily.       glipiZIDE-metformin (METAGLIP) 2.5-500 MG tablet Take 2 tablets by mouth 2 (two) times daily with a meal.     ibuprofen (ADVIL) 200 MG tablet Take 400 mg by mouth 2 (two) times daily as needed for moderate pain.     levofloxacin (LEVAQUIN) 750 MG tablet Take 750 mg by mouth daily. For 14 days.     lisinopril (ZESTRIL) 20 MG tablet Take 20 mg by mouth daily.     oxyCODONE (ROXICODONE) 15 MG immediate release tablet Take 15 mg by mouth 3 (three) times daily.     pioglitazone (ACTOS) 45 MG tablet Take 45 mg by mouth daily.     promethazine (PHENERGAN) 25 MG tablet Take 25 mg by mouth 2 (two) times daily as needed for nausea or vomiting.      Prior to Admission medications   Medication Sig Start Date End Date Taking? Authorizing Provider  citalopram (CELEXA) 40 MG tablet Take 40 mg by mouth daily.   Yes [provider]  dapagliflozin propanediol (FARXIGA) 10 MG TABS tablet Take 10 mg by mouth daily.   Yes [provider]  ergocalciferol (VITAMIN D2) 1.25 MG (50000 UT) capsule Take 50,000 Units by mouth 2 (two)  times a week.   Yes [provider]  furosemide (LASIX) 20 MG tablet Take 40 mg by mouth daily. 07/16/19  Yes [provider]  glipiZIDE-metformin (METAGLIP) 2.5-500 MG tablet Take 2 tablets by mouth 2 (two) times daily with a meal.   Yes [provider]  ibuprofen (ADVIL) 200 MG tablet Take 400 mg by mouth 2 (two) times daily as needed for moderate pain.   Yes [provider]  levofloxacin (LEVAQUIN) 750 MG tablet Take 750 mg by mouth daily. For 14 days.   Yes [provider]  lisinopril (ZESTRIL) 20 MG tablet Take 20 mg by mouth daily.   Yes [provider]  oxyCODONE (ROXICODONE) 15 MG immediate release tablet Take 15 mg by mouth 3 (three) times daily.   Yes [provider]  pioglitazone (ACTOS) 45 MG tablet Take 45 mg by mouth daily.   Yes [provider]  promethazine (PHENERGAN) 25 MG tablet  Take 25 mg by mouth 2 (two) times daily as needed for nausea or vomiting.   Yes [provider]    Blood pressure 124/61, pulse 72, temperature 98.3 F (36.8 C), temperature source Oral, resp. rate 20, height '5\' 9"'  (1.753 m), weight 74.8 kg, SpO2 99 %. Physical Exam: General: chronically ill appearing white male who is laying in bed in NAD HEENT: head is normocephalic, atraumatic.  Sclera are noninjected.  PERRL.  Ears and nose without any masses or lesions.  Mouth is dry. Edentate. Trach in place Heart: regular, rate, and rhythm.  Normal s1,s2. +murmur. Feet WWP but pedal pulses difficult to palpate Lungs: Respiratory effort nonlabored, mechanically ventilated Abd: soft, NT/ND, +BS, no masses, hernias, or organomegaly. G tube site cdi with tube feedings running, drain with golden bilious fluid MS: calves soft and nontender, pitting edema BLE Skin: warm and dry with no masses, lesions, or rashes Psych: unable to assess Neuro: moving all 4 extremities, follows commands GU: 7x7cm sacral wound with circumferential undermining, wound mostly dry but does have some fibrinous exudate at the base, no purulent drainage or odor     Results for orders placed or performed during the hospital encounter of 07/26/19 (from the past 48 hour(s))  Glucose, capillary     Status: Abnormal   Collection Time: 09/22/19  3:27 PM  Result Value Ref Range   Glucose-Capillary 211 (H) 70 - 99 mg/dL    Comment: Glucose reference range applies only to samples taken after fasting for at least 8 hours.  Glucose, capillary     Status: Abnormal   Collection Time: 09/22/19  7:47 PM  Result Value Ref Range   Glucose-Capillary 201 (H) 70 - 99 mg/dL    Comment: Glucose reference range applies only to samples taken after fasting for at least 8 hours.  I-STAT 7, (LYTES, BLD GAS, ICA, H+H)     Status: Abnormal   Collection Time: 09/22/19  8:46 PM  Result Value Ref Range   pH, Arterial 7.277 (L) 7.35 - 7.45   pCO2  arterial 82.9 (HH) 32 - 48 mmHg   pO2, Arterial 111 (H) 83 - 108 mmHg   Bicarbonate 38.9 (H) 20.0 - 28.0 mmol/L   TCO2 41 (H) 22 - 32 mmol/L   O2 Saturation 97.0 %   Acid-Base Excess 10.0 (H) 0.0 - 2.0 mmol/L   Sodium 143 135 - 145 mmol/L   Potassium 5.5 (H) 3.5 - 5.1 mmol/L   Calcium, Ion 1.22 1.15 - 1.40 mmol/L   HCT 30.0 (L) 39 -  52 %   Hemoglobin 10.2 (L) 13.0 - 17.0 g/dL   Patient temperature 98.0 F    Collection site Radial    Drawn by RT    Sample type ARTERIAL   CBC     Status: Abnormal   Collection Time: 09/22/19  9:05 PM  Result Value Ref Range   WBC 15.3 (H) 4.0 - 10.5 K/uL   RBC 3.16 (L) 4.22 - 5.81 MIL/uL   Hemoglobin 8.9 (L) 13.0 - 17.0 g/dL   HCT 31.5 (L) 39 - 52 %   MCV 99.7 80.0 - 100.0 fL   MCH 28.2 26.0 - 34.0 pg   MCHC 28.3 (L) 30.0 - 36.0 g/dL   RDW 17.3 (H) 11.5 - 15.5 %   Platelets 278 150 - 400 K/uL   nRBC 0.0 0.0 - 0.2 %    Comment: Performed at [facility], [place] [address], [place], [place] [address]  Comprehensive metabolic panel     Status: Abnormal   Collection Time: 09/22/19  9:05 PM  Result Value Ref Range   Sodium 142 135 - 145 mmol/L   Potassium 5.0 3.5 - 5.1 mmol/L   Chloride 99 98 - 111 mmol/L   CO2 35 (H) 22 - 32 mmol/L   Glucose, Bld 187 (H) 70 - 99 mg/dL    Comment: Glucose reference range applies only to samples taken after fasting for at least 8 hours.   BUN 137 (H) 8 - 23 mg/dL   Creatinine, Ser 0.99 0.61 - 1.24 mg/dL   Calcium 8.6 (L) 8.9 - 10.3 mg/dL   Total Protein 6.4 (L) 6.5 - 8.1 g/dL   Albumin 1.5 (L) 3.5 - 5.0 g/dL   AST 131 (H) 15 - 41 U/L   ALT 132 (H) 0 - 44 U/L   Alkaline Phosphatase 919 (H) 38 - 126 U/L   Total Bilirubin 6.4 (H) 0.3 - 1.2 mg/dL   GFR calc non Af Amer >60 >60 mL/min   GFR calc Af Amer >60 >60 mL/min   Anion gap 8 5 - 15    Comment: Performed at [facility] [address] [place], [place] [address]  Magnesium     Status: Abnormal   Collection Time: 09/22/19  9:05 PM  Result Value  Ref Range   Magnesium 3.4 (H) 1.7 - 2.4 mg/dL    Comment: Performed at [facility] [address] [place], [place] [address]  I-STAT 7, (LYTES, BLD GAS, ICA, H+H)     Status: Abnormal   Collection Time: 09/22/19  9:47 PM  Result Value Ref Range   pH, Arterial 7.366 7.35 - 7.45   pCO2 arterial 68.3 (HH) 32 - 48 mmHg   pO2, Arterial 76 (L) 83 - 108 mmHg   Bicarbonate 39.3 (H) 20.0 - 28.0 mmol/L   TCO2 41 (H) 22 - 32 mmol/L   O2 Saturation 94.0 %   Acid-Base Excess 12.0 (H) 0.0 - 2.0 mmol/L   Sodium 144 135 - 145 mmol/L   Potassium 4.9 3.5 - 5.1 mmol/L   Calcium, Ion 1.20 1.15 - 1.40 mmol/L   HCT 29.0 (L) 39 - 52 %   Hemoglobin 9.9 (L) 13.0 - 17.0 g/dL   Patient temperature 98.0 F    Collection site Radial    Drawn by Nurse    Sample type ARTERIAL   Glucose, capillary     Status: Abnormal   Collection Time: 09/22/19 11:33 PM  Result Value Ref Range  Glucose-Capillary 167 (H) 70 - 99 mg/dL    Comment: Glucose reference range applies only to samples taken after fasting for at least 8 hours.  Glucose, capillary     Status: Abnormal   Collection Time: 09/23/19  3:37 AM  Result Value Ref Range   Glucose-Capillary 155 (H) 70 - 99 mg/dL    Comment: Glucose reference range applies only to samples taken after fasting for at least 8 hours.  Cooxemetry Panel (carboxy, met, total hgb, O2 sat)     Status: Abnormal   Collection Time: 09/23/19  3:42 AM  Result Value Ref Range   Total hemoglobin 11.7 (L) 12.0 - 16.0 g/dL   O2 Saturation 72.0 %   Carboxyhemoglobin 1.8 (H) 0.5 - 1.5 %   Methemoglobin 1.0 0.0 - 1.5 %    Comment: Performed at [facility] [address], [place], [place] [address]  Comprehensive metabolic panel     Status: Abnormal   Collection Time: 09/23/19  3:42 AM  Result Value Ref Range   Sodium 141 135 - 145 mmol/L   Potassium 4.8 3.5 - 5.1 mmol/L   Chloride 100 98 - 111 mmol/L   CO2 36 (H) 22 - 32 mmol/L   Glucose, Bld 179 (H) 70 - 99 mg/dL     Comment: Glucose reference range applies only to samples taken after fasting for at least 8 hours.   BUN 138 (H) 8 - 23 mg/dL   Creatinine, Ser 1.04 0.61 - 1.24 mg/dL   Calcium 8.3 (L) 8.9 - 10.3 mg/dL   Total Protein 5.4 (L) 6.5 - 8.1 g/dL   Albumin 1.4 (L) 3.5 - 5.0 g/dL   AST 114 (H) 15 - 41 U/L   ALT 118 (H) 0 - 44 U/L   Alkaline Phosphatase 902 (H) 38 - 126 U/L   Total Bilirubin 5.6 (H) 0.3 - 1.2 mg/dL   GFR calc non Af Amer >60 >60 mL/min   GFR calc Af Amer >60 >60 mL/min   Anion gap 5 5 - 15    Comment: Performed at [facility], [place] [address], [place], [place] [address]  CBC     Status: Abnormal   Collection Time: 09/23/19  3:42 AM  Result Value Ref Range   WBC 15.8 (H) 4.0 - 10.5 K/uL   RBC 2.73 (L) 4.22 - 5.81 MIL/uL   Hemoglobin 8.0 (L) 13.0 - 17.0 g/dL   HCT 27.1 (L) 39 - 52 %   MCV 99.3 80.0 - 100.0 fL   MCH 29.3 26.0 - 34.0 pg   MCHC 29.5 (L) 30.0 - 36.0 g/dL   RDW 17.6 (H) 11.5 - 15.5 %   Platelets 257 150 - 400 K/uL   nRBC 0.0 0.0 - 0.2 %    Comment: Performed at [facility] [address], [place], [place] [address]  Glucose, capillary     Status: Abnormal   Collection Time: 09/23/19  7:37 AM  Result Value Ref Range   Glucose-Capillary 162 (H) 70 - 99 mg/dL    Comment: Glucose reference range applies only to samples taken after fasting for at least 8 hours.  Glucose, capillary     Status: Abnormal   Collection Time: 09/23/19 11:57 AM  Result Value Ref Range   Glucose-Capillary 155 (H) 70 - 99 mg/dL    Comment: Glucose reference range applies only to samples taken after fasting for at least 8 hours.  Glucose, capillary     Status: Abnormal  Collection Time: 09/23/19  3:29 PM  Result Value Ref Range   Glucose-Capillary 169 (H) 70 - 99 mg/dL    Comment: Glucose reference range applies only to samples taken after fasting for at least 8 hours.  Glucose, capillary     Status: Abnormal   Collection Time: 09/23/19  7:27 PM  Result Value Ref Range     Glucose-Capillary 174 (H) 70 - 99 mg/dL    Comment: Glucose reference range applies only to samples taken after fasting for at least 8 hours.  Glucose, capillary     Status: Abnormal   Collection Time: 09/23/19 11:53 PM  Result Value Ref Range   Glucose-Capillary 139 (H) 70 - 99 mg/dL    Comment: Glucose reference range applies only to samples taken after fasting for at least 8 hours.  Glucose, capillary     Status: Abnormal   Collection Time: 09/24/19  4:17 AM  Result Value Ref Range   Glucose-Capillary 108 (H) 70 - 99 mg/dL    Comment: Glucose reference range applies only to samples taken after fasting for at least 8 hours.  Cooxemetry Panel (carboxy, met, total hgb, O2 sat)     Status: Abnormal   Collection Time: 09/24/19  4:20 AM  Result Value Ref Range   Total hemoglobin 8.4 (L) 12.0 - 16.0 g/dL   O2 Saturation 67.9 %   Carboxyhemoglobin 2.0 (H) 0.5 - 1.5 %   Methemoglobin 1.1 0.0 - 1.5 %    Comment: Performed at [facility] [address], [place], [place] [address]  Comprehensive metabolic panel     Status: Abnormal   Collection Time: 09/24/19  4:20 AM  Result Value Ref Range   Sodium 145 135 - 145 mmol/L   Potassium 5.0 3.5 - 5.1 mmol/L   Chloride 102 98 - 111 mmol/L   CO2 36 (H) 22 - 32 mmol/L   Glucose, Bld 128 (H) 70 - 99 mg/dL    Comment: Glucose reference range applies only to samples taken after fasting for at least 8 hours.   BUN 120 (H) 8 - 23 mg/dL   Creatinine, Ser 0.98 0.61 - 1.24 mg/dL   Calcium 8.3 (L) 8.9 - 10.3 mg/dL   Total Protein 5.7 (L) 6.5 - 8.1 g/dL   Albumin 1.3 (L) 3.5 - 5.0 g/dL   AST 113 (H) 15 - 41 U/L   ALT 112 (H) 0 - 44 U/L   Alkaline Phosphatase 927 (H) 38 - 126 U/L   Total Bilirubin 5.5 (H) 0.3 - 1.2 mg/dL   GFR calc non Af Amer >60 >60 mL/min   GFR calc Af Amer >60 >60 mL/min   Anion gap 7 5 - 15    Comment: Performed at [facility] [address], [place], [place] [address]  CBC     Status: Abnormal   Collection  Time: 09/24/19  4:20 AM  Result Value Ref Range   WBC 15.0 (H) 4.0 - 10.5 K/uL   RBC 2.72 (L) 4.22 - 5.81 MIL/uL   Hemoglobin 7.7 (L) 13.0 - 17.0 g/dL   HCT 26.9 (L) 39 - 52 %   MCV 98.9 80.0 - 100.0 fL   MCH 28.3 26.0 - 34.0 pg   MCHC 28.6 (L) 30.0 - 36.0 g/dL   RDW 17.8 (H) 11.5 - 15.5 %   Platelets 256 150 - 400 K/uL   nRBC 0.0 0.0 - 0.2 %    Comment: Performed at [facility], [address] [place], [place] [address]  Magnesium     Status: Abnormal   Collection Time: 09/24/19  4:20 AM  Result Value Ref Range   Magnesium 3.5 (H) 1.7 - 2.4 mg/dL    Comment: Performed at [facility] [address], [place], [place] [address]  Glucose, capillary     Status: Abnormal   Collection Time: 09/24/19  7:52 AM  Result Value Ref Range   Glucose-Capillary 115 (H) 70 - 99 mg/dL    Comment: Glucose reference range applies only to samples taken after fasting for at least 8 hours.  Glucose, capillary     Status: Abnormal   Collection Time: 09/24/19 11:09 AM  Result Value Ref Range   Glucose-Capillary 133 (H) 70 - 99 mg/dL    Comment: Glucose reference range applies only to samples taken after fasting for at least 8 hours.   DG CHEST PORT 1 VIEW  Result Date: 09/22/2019 CLINICAL DATA:  Acute respiratory distress. EXAM: PORTABLE CHEST 1 VIEW COMPARISON:  September 22, 2019 (5:19 a.m.) FINDINGS: There is stable right-sided PICC line and tracheostomy tube positioning. Multiple sternal wires and vascular clips are noted. Moderate severity diffuse bilateral infiltrates are seen. More focal infiltrates are again noted within the right upper lobe and bilateral lung bases. There is a small right pleural effusion. No pneumothorax is identified. There is stable enlargement of the cardiac silhouette. The visualized skeletal structures are unremarkable. IMPRESSION: 1. No interval change in the moderate severity bilateral infiltrates, with more focal infiltrates again noted within the right upper  lobe and bilateral lung bases. 2. Small right pleural effusion. Electronically Signed   By: Virgina Norfolk M.D.   On: 09/22/2019 21:20   Anti-infectives (From admission, onward)   Start     Dose/Rate Route Frequency Ordered Stop   09/18/19 1415  levofloxacin (LEVAQUIN) IVPB 750 mg     Discontinue     750 mg 100 mL/hr over 90 Minutes Intravenous Every 24 hours 09/18/19 1400 10/02/19 1414   09/16/19 0800  meropenem (MERREM) 1 g in sodium chloride 0.9 % 100 mL IVPB  Status:  Discontinued        1 g 200 mL/hr over 30 Minutes Intravenous Every 8 hours 09/16/19 0739 09/18/19 1400   09/04/19 1545  ceFAZolin (ANCEF) IVPB 2g/100 mL premix        2 g 200 mL/hr over 30 Minutes Intravenous To Radiology 09/04/19 1538 09/04/19 1647   09/03/19 1415  vancomycin (VANCOREADY) IVPB 750 mg/150 mL        750 mg 150 mL/hr over 60 Minutes Intravenous Every 24 hours 09/03/19 1413 09/13/19 1655   08/31/19 1000  vancomycin (VANCOREADY) IVPB 1250 mg/250 mL  Status:  Discontinued        1,250 mg 166.7 mL/hr over 90 Minutes Intravenous Every 24 hours 08/30/19 1323 09/03/19 1413   08/30/19 0930  meropenem (MERREM) 1 g in sodium chloride 0.9 % 100 mL IVPB  Status:  Discontinued        1 g 200 mL/hr over 30 Minutes Intravenous Every 8 hours 08/30/19 0917 09/03/19 0927   08/30/19 0930  vancomycin (VANCOREADY) IVPB 1500 mg/300 mL        1,500 mg 150 mL/hr over 120 Minutes Intravenous  Once 08/30/19 0918 08/30/19 1152   08/25/19 0200  fluconazole (DIFLUCAN) IVPB 400 mg        400 mg 100 mL/hr over 120 Minutes Intravenous Every 24 hours 08/25/19 0146 09/18/19 0327   08/22/19  1400  piperacillin-tazobactam (ZOSYN) IVPB 3.375 g  Status:  Discontinued        3.375 g 12.5 mL/hr over 240 Minutes Intravenous Every 8 hours 08/22/19 1032 08/29/19 1021   08/20/19 2200  vancomycin (VANCOREADY) IVPB 750 mg/150 mL  Status:  Discontinued        750 mg 150 mL/hr over 60 Minutes Intravenous Every 24 hours 08/19/19 1623 08/20/19  0758   08/20/19 0900  vancomycin (VANCOREADY) IVPB 750 mg/150 mL  Status:  Discontinued        750 mg 150 mL/hr over 60 Minutes Intravenous Every 24 hours 08/20/19 0758 08/21/19 0852   08/16/19 1400  vancomycin (VANCOCIN) IVPB 1000 mg/200 mL premix  Status:  Discontinued        1,000 mg 200 mL/hr over 60 Minutes Intravenous Every 24 hours 08/16/19 1108 08/19/19 1623   08/16/19 0745  anidulafungin ([name]) 100 mg in sodium chloride 0.9 % 100 mL IVPB  Status:  Discontinued       "Followed by" Linked Group Details   100 mg 78 mL/hr over 100 Minutes Intravenous Every 24 hours 08/15/19 0741 08/22/19 1032   08/15/19 0730  anidulafungin ([name]) 200 mg in sodium chloride 0.9 % 200 mL IVPB       "Followed by" Linked Group Details   200 mg 78 mL/hr over 200 Minutes Intravenous  Once 08/15/19 0741 08/15/19 1346   08/13/19 1400  meropenem (MERREM) 1 g in sodium chloride 0.9 % 100 mL IVPB  Status:  Discontinued        1 g 200 mL/hr over 30 Minutes Intravenous Every 8 hours 08/13/19 1236 08/22/19 1032   08/13/19 1400  vancomycin (VANCOREADY) IVPB 1500 mg/300 mL  Status:  Discontinued        1,500 mg 150 mL/hr over 120 Minutes Intravenous Every 24 hours 08/13/19 1244 08/16/19 1108   08/10/19 1504  vancomycin (VANCOCIN) 1,000 mg in sodium chloride 0.9 % 1,000 mL irrigation  Status:  Discontinued          As needed 08/10/19 1505 08/10/19 1840   08/10/19 1500  vancomycin (VANCOCIN) 1,000 mg in sodium chloride 0.9 % 1,000 mL irrigation  Status:  Discontinued         Irrigation To Surgery 08/10/19 1448 08/10/19 1453   08/10/19 1500  vancomycin (VANCOCIN) 1,000 mg in sodium chloride 0.9 % 1,000 mL irrigation  Status:  Discontinued         Irrigation To Surgery 08/10/19 1454 08/10/19 1855   08/10/19 0800  vancomycin (VANCOREADY) IVPB 1750 mg/350 mL        1,750 mg 175 mL/hr over 120 Minutes Intravenous Every 36 hours 08/09/19 1426 08/11/19 2356   08/08/19 2232  vancomycin variable dose per unstable renal  function (pharmacist dosing)  Status:  Discontinued         Does not apply See admin instructions 08/08/19 2232 08/09/19 1426   08/06/19 1827  vancomycin (VANCOCIN) powder  Status:  Discontinued          As needed 08/06/19 1828 08/06/19 1936   08/06/19 1503  vancomycin (VANCOCIN) 1,000 mg in sodium chloride 0.9 % 1,000 mL irrigation  Status:  Discontinued          As needed 08/06/19 1504 08/06/19 1936   08/04/19 2100  vancomycin (VANCOCIN) IVPB 1000 mg/200 mL premix  Status:  Discontinued        1,000 mg 200 mL/hr over 60 Minutes Intravenous Every 12 hours 08/04/19 0721 08/08/19 2225  08/04/19 0730  vancomycin (VANCOREADY) IVPB 2000 mg/400 mL        2,000 mg 200 mL/hr over 120 Minutes Intravenous  Once 08/04/19 0721 08/04/19 1003   08/04/19 0730  meropenem (MERREM) 1 g in sodium chloride 0.9 % 100 mL IVPB        1 g 200 mL/hr over 30 Minutes Intravenous Every 8 hours 08/04/19 0721 08/12/19 2220   08/02/19 2030  vancomycin (VANCOCIN) IVPB 1000 mg/200 mL premix        1,000 mg 200 mL/hr over 60 Minutes Intravenous  Once 08/02/19 1516 08/02/19 2151   08/02/19 1530  cefUROXime (ZINACEF) 1.5 g in sodium chloride 0.9 % 100 mL IVPB        1.5 g 200 mL/hr over 30 Minutes Intravenous Every 12 hours 08/02/19 1516 08/04/19 0430   08/02/19 0400  vancomycin (VANCOREADY) IVPB 1250 mg/250 mL        1,250 mg 166.7 mL/hr over 90 Minutes Intravenous To Surgery 08/01/19 1422 08/02/19 0923   08/02/19 0400  cefUROXime (ZINACEF) 1.5 g in sodium chloride 0.9 % 100 mL IVPB        1.5 g 200 mL/hr over 30 Minutes Intravenous To Surgery 08/01/19 1422 08/02/19 0853   08/02/19 0400  cefUROXime (ZINACEF) 750 mg in sodium chloride 0.9 % 100 mL IVPB        750 mg 200 mL/hr over 30 Minutes Intravenous To Surgery 08/01/19 1422 08/02/19 1430   08/01/19 0400  vancomycin (VANCOREADY) IVPB 1250 mg/250 mL  Status:  Discontinued        1,250 mg 166.7 mL/hr over 90 Minutes Intravenous To Surgery 07/31/19 1034 08/01/19 1421     08/01/19 0400  cefUROXime (ZINACEF) 1.5 g in sodium chloride 0.9 % 100 mL IVPB  Status:  Discontinued        1.5 g 200 mL/hr over 30 Minutes Intravenous To Surgery 07/31/19 1034 08/01/19 1421   08/01/19 0400  cefUROXime (ZINACEF) 750 mg in sodium chloride 0.9 % 100 mL IVPB  Status:  Discontinued        750 mg 200 mL/hr over 30 Minutes Intravenous To Surgery 07/31/19 1034 08/01/19 1421        Assessment/Plan DM Tobacco abuse COPD HTN HLD CHF S/p 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 He has been undergoing hydrotherapy 6x/week, as well as daily wet to dry dressing changes. Primary team spoke with plastics who recommended discussing with general surgery until he gets to the point where he is a candidate for a flap. The wound does still have fibrinous exudate but I did not see any pus and there are no signs of infection. Do not recommend any acute surgical intervention. Will ultimately defer wound care recommendations to the wound care team, but I think it is reasonable to decrease hydrotherapy to 3x per week. I would increase dressing changes to BID and continue santyl for enzymatic debridement. General surgery will sign off, please call with concerns.  ID - currently levaquin VTE - arixtra FEN - TF Foley - none Follow up - wound clinic  Wellington Hampshire, [facility] Surgery 09/24/2019, 12:45 PM Please see Amion for pager number during day hours 7:00am-4:30pm

## 2019-09-24 NOTE — TOC Progression Note (Signed)
Transition of Care [facility]) - Progression Note    Patient Details  Name: IREOLUWA GRANT MRN: [id number] Date of Birth: [birth year]  Transition of Care [facility]) CM/SW Contact  Mearl Latin, LCSW Phone Number: 09/24/2019, 9:03 AM  Clinical Narrative:    CSW spoke with [facility]; they received referral and are reviewing it.    Expected Discharge Plan: Long Term Acute Care (LTAC) Barriers to Discharge: Insurance Authorization  Expected Discharge Plan and Services Expected Discharge Plan: Long Term Acute Care (LTAC)   Discharge Planning Services: CM Consult Post Acute Care Choice: Long Term Acute Care (LTAC) Living arrangements for the past 2 months: Single Family Home                                       Social Determinants of Health (SDOH) Interventions    Readmission Risk Interventions Readmission Risk Prevention Plan 09/03/2019  Transportation Screening Complete  PCP or Specialist Appt within 3-5 Days Complete  HRI or Home Care Consult Complete  Social Work Consult for Recovery Care Planning/Counseling Complete  Palliative Care Screening Complete  Medication Review Oceanographer) Complete  Some recent data might be hidden

## 2019-09-24 NOTE — Progress Notes (Signed)
TCTS Evening Rounds Stable day; weaned on ventilator but not TCTs Bilateral rales RRR No edema  Intake/Output Summary (Last 24 hours) at 09/24/2019 1902 Last data filed at 09/24/2019 1800 Gross per 24 hour  Intake 1189.35 ml  Output 2825 ml  Net -1635.65 ml  a/p: continue supportive care Dore Oquin Z. Vickey Sages, MD [phone]

## 2019-09-24 NOTE — Progress Notes (Signed)
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 has evolved into stage 4 when assessed on 8/9 (Active)  Wound Image   09/20/19 1400  Dressing Type ABD;Barrier Film (skin prep);Normal saline moist dressing;Non adherent;Gauze (Comment) 09/24/19 1200  Dressing Changed;Clean;Dry;Intact 09/24/19 1200  Dressing Change Frequency Daily 09/24/19 1200  State of Healing Non-healing 09/24/19 1200  Site / Wound Assessment Dressing in place / Unable to assess 09/23/19 2000  % Wound base Red or Granulating 20% 09/24/19 1200  % Wound base Yellow/Fibrinous Exudate 40% 09/24/19 1200  % Wound base Black/Eschar 0% 09/22/19 0959  % Wound base Other/Granulation Tissue (Comment) 40% 09/24/19 1200  Peri-wound Assessment Erythema (blanchable);Maceration;Denuded 09/24/19 1200  Wound Length (cm) 6.1 cm 09/20/19 1400  Wound Width (cm) 5.1 cm 09/20/19 1400  Wound Depth (cm) 2 cm 09/20/19 1400  Wound Surface Area (cm^2) 31.11 cm^2 09/20/19 1400  Wound Volume (cm^3) 62.22 cm^3 09/20/19 1400  Undermining (cm) 1.1-1.6 cm around entire wound  09/20/19 1400  Margins Unattached edges (unapproximated) 09/24/19 1200  Drainage Amount Moderate 09/24/19 1200  Drainage Description Serosanguineous 09/24/19 1200  Treatment Hydrotherapy (Pulse lavage);Packing (Saline gauze) 09/24/19 1200   Santyl applied to necrotic tissue    Hydrotherapy Pulsed lavage therapy - wound location: sacrum Pulsed Lavage with Suction (psi): 12 psi Pulsed Lavage with Suction - Normal Saline Used: 1000 mL Pulsed Lavage Tip: Tip with splash shield Selective Debridement Selective Debridement - Location: sacrum Selective Debridement - Tools Used: Forceps;Scissors Selective Debridement - Tissue Removed: yellow/gray slough   Wound Assessment and Plan  Wound Therapy - Assess/Plan/Recommendations Wound Therapy - Clinical Statement: WOC RN consulted and agrees that wound has stalled and now there is a shelf of skin with minimal vascularization that is turning brown, still vascularized but poorly. In agreement to change frequency to 3x/week and Plastics consulted. Pt can continue to benefit from hydrotherapy to reduce bioburden.  Wound Therapy - Functional Problem List: decreased mobility Factors Delaying/Impairing Wound Healing: Diabetes Mellitus;Incontinence;Immobility;Multiple medical problems Hydrotherapy Plan: Debridement;Dressing change;Patient/family education;Pulsatile lavage with suction Wound Therapy - Frequency: 3X / week Wound Therapy - Follow Up Recommendations: Other (comment) (LTACH) Wound Plan: see above  Wound Therapy Goals- Improve the function of patient's integumentary system by progressing the wound(s) through the phases of wound healing (inflammation - proliferation - remodeling) by: Decrease Necrotic Tissue to: 60 Decrease Necrotic Tissue - Progress: Goal set today Increase Granulation Tissue to: 40 Increase Granulation Tissue - Progress: Goal set today Time For Goal Achievement: 7 days Wound Therapy - Potential for Goals: Poor  Goals will be updated until maximal potential achieved or discharge criteria met.  Discharge criteria: when goals achieved, discharge from hospital, MD decision/surgical intervention, no progress towards goals, refusal/missing three consecutive treatments without notification or medical  reason.  GP    Dani Gobble. Migdalia Dk PT, DPT Acute Rehabilitation Services Pager [phone] Office [phone]  [place] 09/24/2019, 12:39 PM

## 2019-09-24 NOTE — Progress Notes (Addendum)
NAME:  Todd [name], MRN:  [id number], DOB:  February 13, 1955, LOS: 3 ADMISSION DATE:  07/26/2019, CONSULTATION DATE:  08/03/19 REFERRING MD:  Roxan Hockey, CHIEF COMPLAINT:  ECMO   Brief History   64 yo male admitted with CHF exacerbation, found to have multi-vessel disease requiring CABG 6/24, post op VT Arrest 6/25 and cannulated for VA ECMO. S/p decannulation and impella.  History of present illness   Presented with worsening dyspnea 6/17 c/w CHF exacerbation.  LHC  with severe triple vessel disease.  Underwent CABG 6/24. Vtach arrest 6/25.  Chest opened bedside and cardiac massage initiated as well as multiple cardioversions, amiodarone, bicarb etc.  Brought to OR and cannulated for VA ECMO.  PCCM consulted to assist with management  Comorbidities include DM, heavy smoking, COPD  Past Medical History  Depression Ischemic cardiomyopathy HTN HLD  Significant Hospital Events   6/24 CABG 6/25 VA cannulation 6/28 decannulated and impella placed 6/29 bedside re-exploration; s/p decannulation. Placement of impella device originally at p8 but decreased to p4 2/2 suction events overnight up to p6. Echo completed and repositioned device. Remained on considerable support with 8 epi and 46 norepi vaso 0.05. continued chest tube output with large clots noted. Heparin thru device. Replacement products ongoing. 60% 8 peep 6/30 bedside mediastinal re-exploration and clean out of hematoma with tamponade and worsening hemodynamics. Pt had large volume transfusion. rebolused with amio 2/2 nsvt episode.  Weight up 48 pounds.  Started diuresis, Lasix drip started. 7/01 iatrogenic respiratory alkalosis, vent rate decreased. 7/02 No significant issues overnight remains on pressors and Impella not tolerating tube feeds with high gastric output awaiting core track placement 7/03 started trickle TF  7/05 Swab removed, CVL and PICC placed. 7/13 Trach and BAL 7/16 Awake and tracking. No follow commands. Still  having fevers. Successful ultrasound and CT guided placement of a 10.2 French cholecystostomy tube. A small amount of aspirated bile was capped and sent to the laboratory for analysis 7/17 Off sedation, PSV.  Beginning to follow some commands occasionally intermittently.  Very jaundiced.  Growing Candida in the blood and BAL > diflucan.   7/21 Ongoing fevers, WBC increased 7/22 Persistent fevers, pan cultured with CT Chest/Head. Suspected source tracheobronchitis.  7/28 Transitioned to eliquis, jaundiced but labs improving  7/29 Agitation/restless.  Trazodone added QHS. Weaning midodrine, attempting diuresis  7/30 Increased Na, weaning on PSV.  8/01 On 40% ATC, significantly improved mental status 8/1>> ongoing attempts at trach weaning complicated by weakness, secretion management, and anxiety 8/8 started back on meropenem and milrinone 8/12 patient making slow improvements, he has been able to tolerate short course ATC 8/13 Weaned off milrinone 8/14 Code blue called for hypoxemia leading to PEA arrest. ROSC achieved after 6 min. 8/15: In  morning patient is unfazed from his recent events and continues asking for water to drink. Awake, alert and follows commands.   Consults:  Advanced heart failure, PCCM, TCTS  Procedures:  LUE PICC 7/5 >>7/21 R Brachial ALine 7/9 >>7/27 Chest Tubes >> L IJ CVC 7/21 >>7/27 Peg tube 7/27 >>  Significant Diagnostic Tests:  6/22 spirometry with restrictive physiology, preserved FEV1/FVC 6/18 echo: LVEF 22-02%, grade I diastolic dysfunction 5/42 US Abdomen cholelithiasis without cholecystitis. Mild wall thinking in setting of liver disease and ascites.  7/10 LE [place] negative for DVT  7/20 CT ABD w contrast >> stable position of the percutaneous cholecystostomy tube with complete decompression of the gallbladder, trace residual free fluid within the RUQ, decreased since prior, trace bilateral pleural  effusions L>R, scattered areas of ground glass airspace  disease within the RML, RLL 7/22 CT Chest w/o Contrast >> Retrosternal fluid collection, non specific.  Bilateral pleural effusions, portions loculated.   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 Stable bilateral mastoid and middle ear effusions.   Micro Data:  7/3 RQ> Few candida tropicalis, likely contaminant 7/12 BAL Cx> Candida tropicalis and albicans, likely contaminant 7/14 BCx >> candida parapsilosis 7/17 BCx >> neg resp 8/7 >> stenotrophomonas  Antimicrobials:  Meropenem 8/8 >>8/10 Diflucan  7/16 >> 8/9 vanc 7/22 >>8/10 levaquin 8/10 >>  Interim history/subjective:  Appears comfortable but weak  Objective   Blood pressure (Abnormal) 109/53, pulse 68, temperature 98.3 F (36.8 C), temperature source Oral, resp. rate (Abnormal) 24, height _0  (1.753 m), weight 74.8 kg, SpO2 100 %.    Vent Mode: CPAP;PSV FiO2 (%):  [40 %] 40 % Set Rate:  [20 bmp] 20 bmp Vt Set:  [420 mL] 420 mL PEEP:  [5 cmH20] 5 cmH20 Pressure Support:  [8 cmH20] 8 cmH20 Plateau Pressure:  [19 cmH20-154 cmH20] 154 cmH20   Intake/Output Summary (Last 24 hours) at 09/24/2019 0855 Last data filed at 09/24/2019 0600 Gross per 24 hour  Intake 2413.3 ml  Output 3525 ml  Net -1111.7 ml   Filed Weights   09/22/19 0352 09/23/19 0600 09/24/19 0600  Weight: 76.1 kg 77.4 kg 74.8 kg   Physical Exam:  General this is a significantly deconditioned malnourished 64 year old male remains on pressure support ventilation HEENT normocephalic atraumatic however does exhibit temporal wasting sclera still somewhat icteric Tracheostomy is unremarkable Pulmonary equal chest rise currently on pressure support of 8, PEEP 5.  Tidal volume in the 500 range no accessory use with this.  Does have scattered rhonchi bilaterally Cardiac regular rate and rhythm with systolic murmur audible Abdomen soft nontender.  PEG tube unremarkable, right lower  quadrant percutaneous drain with bilious output GU clear/concentrated yellow urine Neuro generalized weakness follows commands Extremities warm dry dependent edema with pitting edema in the lower extremity    Resolved Hospital Problem list   Suspected HCAP Acute kidney injury Hypernatremia Acute Metabolic Encephalopathy  -CTH without acute intracranial pathology. EEG negative 7/23.  Shock Heparin-induced thrombocytopenia >> resolved, required bivalirudin 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 -Opthalmology evaluated , diflucan x 3 weeks PEA arrest 8/14 preceded by hypoxemia Assessment & Plan:   Acute hypoxic respiratory failure w/ need for mechanical ventilation d/t stenotrophomonas HCAP and severe weakness and deconditioning  Tracheostomy dependence  Wbc ct stable, no fever spike Last chest x-ray personally reviewed from the 14th.  Right greater than left airspace disease small right pleural effusion Plan Day 7/14 Levaquin Resume PSV trials today Routine trach care pulm hygiene VAP bundle  PAD protocol RASS goal 0 to -1  Lasix x1  Cardiogenic/hemorrhagic shock status post CABG complicated by VT arrest,  S/p VA ECMO (decanulated 6/28).  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 They have directed me to general surgery unless we get to point flap is an option  Deconditioning of Critical Illness  Plan PT/OT   Small left adrenal mass seen on CT scan of the abdomen August 24, 2019 Plan Out-pt follow up   Daily Goals Checklist  Pain/Anxiety/Delirium protocol (if indicated): see above  VAP protocol (if indicated): bundle in place.  DVT prophylaxis:arixtra (resumed 8/16) Nutrition Status: TF  GI prophylaxis: PPI Urinary catheter: n/a Central lines: removed Glucose control: See above Mobility/therapy needs: PT for ROM  Code Status: Full  Family Communication: per primary Disposition: ICU -- await LTACH   Critical care x35 minutes  Erick Colace ACNP-BC [place] Pager # [phone] OR # [phone] if no answer

## 2019-09-24 NOTE — Consult Note (Signed)
WOC Nurse wound follow up Discussed with Valentina Shaggy, PT performing hydrotherapy prior to assessment to determine if hydrotherapy should continue. Wound type: Pressure, perfusion. Stage 4 Measurement: 7cm x 7cm  with full depth not yet determined due to the presence of fibrinous slough in wound bed. Undermining from 6-10 o'clock measuring 2.6cm with skin surface of undermined area beginning to break down. Wound bed: Pale pink in scattered areas, brown discoloration at undermined area. Drainage (amount, consistency, odor) moderate amount light brown exudate on old dressing Periwound: friable. Dressing procedure/placement/frequency: Currently with collagenase and NS topper to wound bed with hydrotherapy 6 days/week.  Recommend decreasing hydrotherapy to three times per week (M/W/F or T/TH/Sa). This therapy may be discontinued upon pending discharge to an LTAC.   Also discussed with Kreg Shropshire, PA the request for consideration of a Plastic Surgery consultation to ensure the POC for this Stage 4 PI is in alignment with maximum potential for tissue repair and regeneration given the severity of illness and extent of tissue destruction. Right first digit (distal tip) with 100% dry eschar.  WOC nursing team will follow, seeing every 7-10 days and will remain available to this patient, the nursing and medical teams.  Please re-consult if needed in between visits. Thank you, Ladona Mow, MSN, RN, Perlie Gold, [place]  Pager# [phone]

## 2019-09-24 NOTE — Progress Notes (Signed)
      [address]       [place] [address]             [phone]                 38 Days Post-Op Procedure(s) (LRB): REMOVAL OF IMPELLA LEFT VENTRICULAR ASSIST DEVICE (N/A) TRANSESOPHAGEAL ECHOCARDIOGRAM (TEE) (N/A)   Events: No events overnight ___________________________________________ Vitals: BP (!) 109/53   Pulse 68   Temp 98.3 F (36.8 C) (Oral)   Resp (!) 24   Ht 5\' 9"  (1.753 m)   Wt 74.8 kg   SpO2 100%   BMI 24.35 kg/m   - Neuro: arousable, following commands  - Cardiovascular: regular  Drips: none.      - Pulm: Trached.  EWOB Vent Mode: PRVC FiO2 (%):  [40 %] 40 % Set Rate:  [20 bmp] 20 bmp Vt Set:  [420 mL] 420 mL PEEP:  [5 cmH20] 5 cmH20 Pressure Support:  [8 cmH20] 8 cmH20 Plateau Pressure:  [19 cmH20-154 cmH20] 154 cmH20  ABG    Component Value Date/Time   PHART 7.366 09/22/2019 2147   PCO2ART 68.3 (HH) 09/22/2019 2147   PO2ART 76 (L) 09/22/2019 2147   HCO3 39.3 (H) 09/22/2019 2147   TCO2 41 (H) 09/22/2019 2147   ACIDBASEDEF 1.0 08/08/2019 1115   O2SAT 67.9 09/24/2019 0420    - Abd: soft - Extremity: warm  .Intake/Output      08/15 0701 - 08/16 0700 08/16 0701 - 08/17 0700   I.V. (mL/kg) 224.3 (3)    Other 10    NG/GT 2029    IV Piggyback 150    Total Intake(mL/kg) 2413.3 (32.3)    Urine (mL/kg/hr) 2775 (1.5)    Drains 550    Stool 400    Total Output 3725    Net -1311.7            _______________________________________________________________ Labs: CBC Latest Ref Rng & Units 09/24/2019 09/23/2019 09/22/2019  WBC 4.0 - 10.5 K/uL 15.0(H) 15.8(H) -  Hemoglobin 13.0 - 17.0 g/dL 7.7(L) 8.0(L) 9.9(L)  Hematocrit 39 - 52 % 26.9(L) 27.1(L) 29.0(L)  Platelets 150 - 400 K/uL 256 257 -   CMP Latest Ref Rng & Units 09/24/2019 09/23/2019 09/22/2019  Glucose 70 - 99 mg/dL 09/24/2019) 299(B) -  BUN 8 - 23 mg/dL 716(R) 678(L) -  Creatinine 0.61 - 1.24 mg/dL 381(O 1.75 -  Sodium 1.02 - 145 mmol/L 145 141 144  Potassium 3.5 -  5.1 mmol/L 5.0 4.8 4.9  Chloride 98 - 111 mmol/L 102 100 -  CO2 22 - 32 mmol/L 36(H) 36(H) -  Calcium 8.9 - 10.3 mg/dL 8.3(L) 8.3(L) -  Total Protein 6.5 - 8.1 g/dL 585) 2.7(P) -  Total Bilirubin 0.3 - 1.2 mg/dL 5.5(H) 5.6(H) -  Alkaline Phos 38 - 126 U/L 927(H) 902(H) -  AST 15 - 41 U/L 113(H) 114(H) -  ALT 0 - 44 U/L 112(H) 118(H) -    CXR: pending  _______________________________________________________________  Assessment and Plan:  s/p CABG on 6/24, VA ecmo, impella  Neuro: pain controlled.  mentating well CV: Good hemodynamics Pulm: continue pulmonary toilet.  Mucus plug cleared.  vent wean as tolerated.  Renal: creat stable 0.98 today.  Good uop GI: on tube feeds.   Heme: stable.  plts recovered.   ID: on levaquin for pneumonia.  WBC 15 Endo: SSI Dispo: continue ICU care.  7/24, MD 09/24/2019 8:02 AM

## 2019-09-25 ENCOUNTER — Inpatient Hospital Stay (HOSPITAL_COMMUNITY): Payer: Medicare Other

## 2019-09-25 ENCOUNTER — Other Ambulatory Visit: Payer: Self-pay | Admitting: Cardiothoracic Surgery

## 2019-09-25 DIAGNOSIS — J9621 Acute and chronic respiratory failure with hypoxia: Secondary | ICD-10-CM | POA: Diagnosis not present

## 2019-09-25 DIAGNOSIS — B377 Candidal sepsis: Secondary | ICD-10-CM | POA: Diagnosis not present

## 2019-09-25 LAB — GLUCOSE, CAPILLARY
Glucose-Capillary: 102 mg/dL — ABNORMAL HIGH (ref 70–99)
Glucose-Capillary: 110 mg/dL — ABNORMAL HIGH (ref 70–99)
Glucose-Capillary: 124 mg/dL — ABNORMAL HIGH (ref 70–99)
Glucose-Capillary: 152 mg/dL — ABNORMAL HIGH (ref 70–99)
Glucose-Capillary: 214 mg/dL — ABNORMAL HIGH (ref 70–99)
Glucose-Capillary: 43 mg/dL — CL (ref 70–99)
Glucose-Capillary: 62 mg/dL — ABNORMAL LOW (ref 70–99)
Glucose-Capillary: 87 mg/dL (ref 70–99)

## 2019-09-25 LAB — CBC
HCT: 26 % — ABNORMAL LOW (ref 39.0–52.0)
Hemoglobin: 7.3 g/dL — ABNORMAL LOW (ref 13.0–17.0)
MCH: 27.9 pg (ref 26.0–34.0)
MCHC: 28.1 g/dL — ABNORMAL LOW (ref 30.0–36.0)
MCV: 99.2 fL (ref 80.0–100.0)
Platelets: 263 10*3/uL (ref 150–400)
RBC: 2.62 MIL/uL — ABNORMAL LOW (ref 4.22–5.81)
RDW: 18 % — ABNORMAL HIGH (ref 11.5–15.5)
WBC: 15.8 10*3/uL — ABNORMAL HIGH (ref 4.0–10.5)
nRBC: 0 % (ref 0.0–0.2)

## 2019-09-25 LAB — BASIC METABOLIC PANEL
Anion gap: 7 (ref 5–15)
BUN: 114 mg/dL — ABNORMAL HIGH (ref 8–23)
CO2: 37 mmol/L — ABNORMAL HIGH (ref 22–32)
Calcium: 8.3 mg/dL — ABNORMAL LOW (ref 8.9–10.3)
Chloride: 104 mmol/L (ref 98–111)
Creatinine, Ser: 1.01 mg/dL (ref 0.61–1.24)
GFR calc Af Amer: >60 mL/min (ref >60)
GFR calc non Af Amer: >60 mL/min (ref >60)
Glucose, Bld: 100 mg/dL — ABNORMAL HIGH (ref 70–99)
Potassium: 4.7 mmol/L (ref 3.5–5.1)
Sodium: 148 mmol/L — ABNORMAL HIGH (ref 135–145)

## 2019-09-25 LAB — COOXEMETRY PANEL
Carboxyhemoglobin: 2 % — ABNORMAL HIGH (ref 0.5–1.5)
Methemoglobin: 0.7 % (ref 0.0–1.5)
O2 Saturation: 67.8 %
Total hemoglobin: 7.9 g/dL — ABNORMAL LOW (ref 12.0–16.0)

## 2019-09-25 LAB — HEMOGLOBIN A1C
Hgb A1c MFr Bld: 5.3 % (ref 4.8–5.6)
Mean Plasma Glucose: 105.41 mg/dL

## 2019-09-25 MED ORDER — DEXTROSE 50 % IV SOLN
12.5000 g | INTRAVENOUS | Status: AC
  Administered 2019-09-25: 12.5 g via INTRAVENOUS

## 2019-09-25 MED ORDER — DEXTROSE 50 % IV SOLN
INTRAVENOUS | Status: AC
  Administered 2019-09-25: 25 g via INTRAVENOUS
  Filled 2019-09-25: qty 50

## 2019-09-25 MED ORDER — DEXTROSE 50 % IV SOLN: 25.0000 g | INTRAVENOUS | Status: AC

## 2019-09-25 MED ORDER — SODIUM CHLORIDE 0.9 % IV SOLN
25.0000 mg | Freq: Once | INTRAVENOUS | Status: AC
  Administered 2019-09-25: 25 mg via INTRAVENOUS
  Filled 2019-09-25: qty 2

## 2019-09-25 MED ORDER — COLLAGENASE 250 UNIT/GM EX OINT
TOPICAL_OINTMENT | Freq: Two times a day (BID) | CUTANEOUS | Status: DC
  Administered 2019-09-26 – 2019-10-02 (×6): 1 via TOPICAL
  Filled 2019-09-25 (×3): qty 30

## 2019-09-25 MED ORDER — ALBUMIN HUMAN 25 % IV SOLN
25.0000 g | Freq: Once | INTRAVENOUS | Status: AC
  Administered 2019-09-25: 25 g via INTRAVENOUS
  Filled 2019-09-25: qty 100

## 2019-09-25 MED ORDER — INSULIN ASPART 100 UNIT/ML ~~LOC~~ SOLN
0.0000 [IU] | SUBCUTANEOUS | Status: DC
  Administered 2019-09-25: 1 [IU] via SUBCUTANEOUS
  Administered 2019-09-25 – 2019-09-26 (×2): 2 [IU] via SUBCUTANEOUS
  Administered 2019-09-26: 1 [IU] via SUBCUTANEOUS
  Administered 2019-09-27: 2 [IU] via SUBCUTANEOUS
  Administered 2019-09-27 – 2019-10-02 (×8): 1 [IU] via SUBCUTANEOUS
  Administered 2019-10-02: 2 [IU] via SUBCUTANEOUS
  Administered 2019-10-03: 4 [IU] via SUBCUTANEOUS
  Administered 2019-10-03 (×4): 1 [IU] via SUBCUTANEOUS
  Administered 2019-10-04 (×3): 4 [IU] via SUBCUTANEOUS

## 2019-09-25 MED ORDER — SODIUM CHLORIDE 3 % IN NEBU
4.0000 mL | INHALATION_SOLUTION | Freq: Two times a day (BID) | RESPIRATORY_TRACT | Status: DC
  Administered 2019-09-25 – 2019-09-26 (×3): 4 mL via RESPIRATORY_TRACT
  Filled 2019-09-25 (×4): qty 4

## 2019-09-25 MED ORDER — CLONAZEPAM 1 MG PO TABS
1.0000 mg | ORAL_TABLET | Freq: Two times a day (BID) | ORAL | Status: DC
  Administered 2019-09-26 – 2019-10-02 (×13): 1 mg
  Filled 2019-09-25 (×12): qty 1

## 2019-09-25 MED ORDER — FUROSEMIDE 10 MG/ML IJ SOLN
80.0000 mg | Freq: Once | INTRAMUSCULAR | Status: AC
  Administered 2019-09-25: 80 mg via INTRAVENOUS
  Filled 2019-09-25: qty 8

## 2019-09-25 MED ORDER — FREE WATER
200.0000 mL | Status: DC
  Administered 2019-09-25 – 2019-09-26 (×7): 200 mL

## 2019-09-25 NOTE — Progress Notes (Signed)
Occupational Therapy Treatment Patient Details Name: Todd [name] MRN: [id number] DOB: [birth year] Today's Date: 09/25/2019    History of present illness Hodges Treiber is a 64 y.o. male presenting with shortness of breath; noted AKI, CHF, recent PNA, elevated troponin, pulm edema/effusion, and cardiomegaly.Marland Kitchen CABG 6/24 and intubated (extubated 6/25)--went into cardiac arrest on same date with re-opening of chest for heart massage and direct epicardial paddles and place on ECMO. 08/03/19 washout out for tamponade. 7/2 sternal closure and wound vac. 7/9 Impella removed.08/20/19 Tracheostomy 08/24/19 cholecystostomy tube. 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 PMHx: HFrEF, COPD, HTN, T2DM, HLD, MDD, chronic back pain, vitamin deficiency, BPH, tobacco use disorder, marijuana use disorder   OT comments  Pt making slow progress towards OT goals, presents supine in bed agreeable to working with therapies. Pt tolerating tilt via Kreg bed, tilting to 42* for approx 10 min. Pt engaging in UB AAROM given multimodal cues and encouragement to participate. Pt continues to have decreased AROM in LUE (compared to RUE), and overall presenting with atrophy, weakness, and pain in back/sacrum. Will continue to follow acutely to progress pt's overall strength and activity tolerance as precursor to ADL/further mobility.   BP at approx 5 min tilt 110/56 approx 10 min til: 105/53    Follow Up Recommendations  LTACH;Supervision/Assistance - 24 hour    Equipment Recommendations  Other (comment) (TBA)          Precautions / Restrictions Precautions Precautions: Fall;Sternal Precaution Comments: trach, chole drain, sacral wound Other Brace: Bil Prevalon Restrictions Weight Bearing Restrictions: No       Mobility Bed Mobility               General bed mobility comments: Moved pt up in bed with total assist of 2, max assist +2 to roll for repositioning  Transfers Overall  transfer level: Needs assistance     Sit to Stand: +2 safety/equipment;Total assist         General transfer comment: Tilted bed to 42 degrees for 10 minutes.        ADL either performed or assessed with clinical judgement   ADL Overall ADL's : Needs assistance/impaired                                       General ADL Comments: total a for all basic ADLs     Vision       Perception     Praxis      Cognition Arousal/Alertness: Awake/alert Behavior During Therapy: Flat affect Overall Cognitive Status: Impaired/Different from baseline Area of Impairment: Orientation;Memory                 Orientation Level: Time;Disoriented to;Place Current Attention Level: Sustained Memory: Decreased short-term memory Following Commands: Follows one step commands with increased time Safety/Judgement: Decreased awareness of safety;Decreased awareness of deficits Awareness: Intellectual Problem Solving: Slow processing;Decreased initiation;Difficulty sequencing;Requires verbal cues;Requires tactile cues General Comments: pt stating date as "Oct" "1999", Pt asking "where's my baby". Pt unable to recall date after education and focused on back and sacral pain throughout session        Exercises General Exercises - Upper Extremity Shoulder Flexion: AAROM;Both;5 reps (standing via tilt bed) Elbow Flexion: AAROM;Left;5 reps Elbow Extension: AAROM;Left;5 reps Hand Exercises Forearm Supination: AAROM;Left;5 reps Forearm Pronation: AAROM;Left;5 reps   Shoulder Instructions       General Comments  Pertinent Vitals/ Pain       Pain Assessment: Faces Faces Pain Scale: Hurts little more Pain Location: sacrum with sliding and repositioning Pain Descriptors / Indicators: Grimacing;Guarding Pain Intervention(s): Monitored during session;Repositioned;Limited activity within patient's tolerance  Home Living                                           Prior Functioning/Environment              Frequency  Min 2X/week        Progress Toward Goals  OT Goals(current goals can now be found in the care plan section)  Progress towards OT goals: Progressing toward goals  Acute Rehab OT Goals Patient Stated Goal: pt unable to state OT Goal Formulation: Patient unable to participate in goal setting Time For Goal Achievement: 10/02/19 Potential to Achieve Goals: Fair  Plan Discharge plan remains appropriate    Co-evaluation    PT/OT/SLP Co-Evaluation/Treatment: Yes Reason for Co-Treatment: Complexity of the patient's impairments (multi-system involvement);For patient/therapist safety   OT goals addressed during session: Strengthening/ROM      AM-PAC OT "6 Clicks" Daily Activity     Outcome Measure   Help from another person eating meals?: Total Help from another person taking care of personal grooming?: Total Help from another person toileting, which includes using toliet, bedpan, or urinal?: Total Help from another person bathing (including washing, rinsing, drying)?: Total Help from another person to put on and taking off regular upper body clothing?: Total Help from another person to put on and taking off regular lower body clothing?: Total 6 Click Score: 6    End of Session Equipment Utilized During Treatment: Oxygen  OT Visit Diagnosis: Other abnormalities of gait and mobility (R26.89);Muscle weakness (generalized) (M62.81);Other symptoms and signs involving cognitive function;Pain Pain - Right/Left: Left Pain - part of body: Shoulder;Arm   Activity Tolerance Patient tolerated treatment well   Patient Left in bed;with call bell/phone within reach   Nurse Communication Mobility status        Time: 1610-9604 OT Time Calculation (min): 35 min  Charges: OT General Charges $OT Visit: 1 Visit OT Treatments $Therapeutic Activity: 8-22 mins  Marcy Siren, OT Acute Rehabilitation  Services Pager [phone] Office [phone]    [name] 09/25/2019, 4:35 PM

## 2019-09-25 NOTE — Progress Notes (Addendum)
NAME:  Todd [name], MRN:  [id number], DOB:  01-Mar-1955, LOS: 60 ADMISSION DATE:  07/26/2019, CONSULTATION DATE:  08/03/19 REFERRING MD:  Roxan Hockey, CHIEF COMPLAINT:  ECMO   Brief History   64 yo male admitted with CHF exacerbation, found to have multi-vessel disease requiring CABG 6/24, post op VT Arrest 6/25 and cannulated for VA ECMO. S/p decannulation and impella.  History of present illness   Presented with worsening dyspnea 6/17 c/w CHF exacerbation.  LHC  with severe triple vessel disease.  Underwent CABG 6/24. Vtach arrest 6/25.  Chest opened bedside and cardiac massage initiated as well as multiple cardioversions, amiodarone, bicarb etc.  Brought to OR and cannulated for VA ECMO.  PCCM consulted to assist with management  Comorbidities include DM, heavy smoking, COPD  Past Medical History  Depression, Ischemic cardiomyopathy, HTN, [facility] Events   6/24 CABG 6/25 VA cannulation 6/28 decannulated and impella placed 6/29 bedside re-exploration; s/p decannulation. Placement of impella device originally at p8 but decreased to p4 2/2 suction events overnight up to p6. Echo completed and repositioned device. Remained on considerable support with 8 epi and 46 norepi vaso 0.05. continued chest tube output with large clots noted. Heparin thru device. Replacement products ongoing. 60% 8 peep 6/30 bedside mediastinal re-exploration and clean out of hematoma with tamponade and worsening hemodynamics. Pt had large volume transfusion. rebolused with amio 2/2 nsvt episode.  Weight up 48 pounds.  Started diuresis, Lasix drip started. 7/01 iatrogenic respiratory alkalosis, vent rate decreased. 7/02 No significant issues overnight remains on pressors and Impella not tolerating tube feeds with high gastric output awaiting core track placement 7/03 started trickle TF  7/05 Swab removed, CVL and PICC placed. 7/13 Trach and BAL 7/16 Awake and tracking. No follow commands. Still  having fevers. Successful ultrasound and CT guided placement of a 10.2 French cholecystostomy tube. A small amount of aspirated bile was capped and sent to the laboratory for analysis 7/17 Off sedation, PSV.  Beginning to follow some commands occasionally intermittently.  Very jaundiced.  Growing Candida in the blood and BAL > diflucan.   7/21 Ongoing fevers, WBC increased 7/22 Persistent fevers, pan cultured with CT Chest/Head. Suspected source tracheobronchitis.  7/28 Transitioned to eliquis, jaundiced but labs improving  7/29 Agitation/restless.  Trazodone added QHS. Weaning midodrine, attempting diuresis  7/30 Increased Na, weaning on PSV.  8/01 On 40% ATC, significantly improved mental status 8/1>> ongoing attempts at trach weaning complicated by weakness, secretion management, and anxiety 8/8 started back on meropenem and milrinone 8/12 patient making slow improvements, he has been able to tolerate short course ATC 8/13 Weaned off milrinone 8/14 Code blue called for hypoxemia leading to PEA arrest. ROSC achieved after 6 min. 8/15: In  morning patient is unfazed from his recent events and continues asking for water to drink. Awake, alert and follows commands. 8/16 seen by surg per wound care recs-->"Do not recommend any acute surgical intervention. Will ultimately defer wound care recommendations to the wound care team, but I think it is reasonable to decrease hydrotherapy to 3x per week. I would increase dressing changes to BID and continue santyl for enzymatic debridement".   Consults:  Advanced heart failure, PCCM, TCTS  Procedures:  LUE PICC 7/5 >>7/21 R Brachial ALine 7/9 >>7/27 Chest Tubes >> L IJ CVC 7/21 >>7/27 Peg tube 7/27 >>  Significant Diagnostic Tests:  6/22 spirometry with restrictive physiology, preserved FEV1/FVC 6/18 echo: LVEF 29-52%, grade I diastolic dysfunction 8/41 US Abdomen cholelithiasis without  cholecystitis. Mild wall thinking in setting of liver disease  and ascites.  7/10 LE [place] negative for DVT  7/20 CT ABD w contrast >> stable position of the percutaneous cholecystostomy tube with complete decompression of the gallbladder, trace residual free fluid within the RUQ, decreased since prior, trace bilateral pleural effusions L>R, scattered areas of ground glass airspace disease within the RML, RLL 7/22 CT Chest w/o Contrast >> Retrosternal fluid collection, non specific.  Bilateral pleural effusions, portions loculated.   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 Stable bilateral mastoid and middle ear effusions.   Micro Data:  7/3 RQ> Few candida tropicalis, likely contaminant 7/12 BAL Cx> Candida tropicalis and albicans, likely contaminant 7/14 BCx >> candida parapsilosis 7/17 BCx >> neg resp 8/7 >> stenotrophomonas  Antimicrobials:  Meropenem 8/8 >>8/10 Diflucan  7/16 >> 8/9 vanc 7/22 >>8/10 levaquin 8/10 >>  Interim history/subjective:  Looks comfortable on PSV  Objective   Blood pressure (Abnormal) 106/50, pulse 70, temperature 98.6 F (37 C), temperature source Oral, resp. rate 20, height _0  (1.753 m), weight 73.1 kg, SpO2 99 %.    Vent Mode: CPAP;PSV FiO2 (%):  [40 %] 40 % Set Rate:  [20 bmp] 20 bmp Vt Set:  [420 mL] 420 mL PEEP:  [5 cmH20] 5 cmH20 Pressure Support:  [8 cmH20-10 cmH20] 10 cmH20 Plateau Pressure:  [16 cmH20-20 cmH20] 16 cmH20   Intake/Output Summary (Last 24 hours) at 09/25/2019 0900 Last data filed at 09/25/2019 0600 Gross per 24 hour  Intake 2084.48 ml  Output 2785 ml  Net -700.52 ml   Filed Weights   09/23/19 0600 09/24/19 0600 09/25/19 0500  Weight: 77.4 kg 74.8 kg 73.1 kg   Physical Exam:  General this is a pleasant 64 year old white male. Looks comfortable on PSV HENT NCAT no JVD. Trach unremarkable Pulm scattered rhonchi. PS 10/peep 5 VTs in 500s. No accessory use  Card RRR abd not tender +  tol tubefeeds Ext  still w/ dependent pitting edema. More prom in the sacrum and pelvis  Neuro awake oriented. Generalize weakness GU cl yellow  Derm sacral wound dressed.    Resolved Hospital Problem list   Suspected HCAP Acute kidney injury Hypernatremia Acute Metabolic Encephalopathy  -CTH without acute intracranial pathology. EEG negative 7/23.  Shock Heparin-induced thrombocytopenia >> resolved, required bivalirudin 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 -Opthalmology evaluated , diflucan x 3 weeks PEA arrest 8/14 preceded by hypoxemia Assessment & Plan:   Acute hypoxic respiratory failure w/ need for mechanical ventilation d/t stenotrophomonas HCAP and severe weakness and deconditioning  Tracheostomy dependence  Wbc ct stable, no fever spike CXR personally reviewed. Diffuse bilateral airspace disease. Suspect some of this is slow to resolve ALI after HCAP. Aeration a little better (minimally) Plan Day 8/14 Levaquin Cont daily PSV trials and initiate daily ATC assessment Cont routine trach care Cont aggressive Chest PT/pulm hygiene; adding HT saline Neb Seems like he still has volume to give. WIll try albumin-->lasix today and push as long as BP/BUN/Cr allow   Cardiogenic/hemorrhagic shock status post CABG complicated by VT arrest,  S/p VA ECMO (decanulated 6/28).  S/p impella placement (6/28> 7/9) Plan Primary rx heart failure and TCTS Strict I&O  Atrial flutter , reverted to nSR Plan Cont tele Cont asa  Cont amiodarone   Fluid and electrolyte imbalance  Plan Adjust free water Am chemistry   Hyperbilirubinemia due to acalculous cholecystitis vs drug-induced cholestasis Possible  cirrhosis per 7/8 [place]  Status post percutaneous GB drain by IR Jaundice-bilirubin  LFTs stable  Plan Cont drain per IR Intermittent LFTs  DM II with fluctuant hypo/hyperglycemia ->excellent glycemic control Plan Cont current ssi and Levemir rx   Acute blood loss anemia and critical  illness anemia , sp 1 U PRBC 8/5 & 8/9  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    Sacral Decubitus  Plan As directed by wound team Maximize nutrition Pressure relief interventions   Deconditioning of Critical Illness  Plan He is going to need extensive PT and OT   Small left adrenal mass seen on CT scan of the abdomen August 24, 2019 Plan Out-pt follow up.  Daily Goals Checklist  Pain/Anxiety/Delirium protocol (if indicated): see above  VAP protocol (if indicated): bundle in place.  DVT prophylaxis:arixtra (resumed 8/16) Nutrition Status: TF  GI prophylaxis: PPI Urinary catheter: n/a Central lines: removed Glucose control: See above Mobility/therapy needs: PT for ROM  Code Status: Full  Family Communication: per primary Disposition: ICU -- await LTACH   Critical care x 32 min   Erick Colace ACNP-BC [place] Pager # [phone] OR # [phone] if no answer

## 2019-09-25 NOTE — Progress Notes (Signed)
Nutrition Follow-up  DOCUMENTATION CODES:   Non-severe (moderate) malnutrition in context of acute illness/injury  INTERVENTION:   Tube Feeding:  -Pivot 1.5 to 70 ml/hr via PEG -Free water flushes 200 ml Q4 hours Provides 158 g of protein, 2520 kcals, 2478 mL of free water  Continue Juven BID, each packet provides 80 calories, 8 grams of carbohydrate, 2.5  grams of protein (collagen), 7 grams of L-arginine and 7 grams of L-glutamine; supplement contains CaHMB, Vitamins C, E, B12 and Zinc to promote wound healing  Plan to consider transitioning to bolus feedings prior to discharge   NUTRITION DIAGNOSIS:   Moderate Malnutrition related to acute illness as evidenced by mild fat depletion, mild muscle depletion, moderate muscle depletion.  Being addressed via TF   GOAL:   Patient will meet greater than or equal to 90% of their needs  Met  MONITOR:   Vent status, TF tolerance, Labs, Weight trends  REASON FOR ASSESSMENT:   Consult  ([facility] tube feeding recommendations)  ASSESSMENT:   Patient with PMH significant for CHF, COPD, HTN, DM, HLD, MDD, and BPH. Presents this admission with CHF exacerbation.  6/24- s/p CABG x4 6/25- VT arrest, emergent sternotomy, VA ECMO cannulation 6/28- De-cannulated, Impella placed 6/29-Bedside Mediastinal Exploration 2/2 tamponade with multiple blood clots removed 7/02 OR for sternal closure with application of wound vac, Cortrak placed but post-pyloric placement unsuccessful 7/03 TF stared at 20 ml/hr but pt with emesis, OG to LWS with 850 mL out 7/04 TPN started at 30 ml/hr 7/05 TPN remains at 30 ml/hr 7/06 TPN at 55 ml/hr, IR placed 10 fr post-pyloric feeding tube. Trickle TF initiated 7/07 TF titration, TPN discontinued 7/09 HIT positive, bivalirudin started 7/09 Impella removed 7/12 Trach placed 7/16 Cholecystostomy tube 7/27 PEG tube 8/14 PEA arrest  PS trials as able, vent support via trach SLP following for possible  inline PMV  Tolerating Pivot 1.5 TF and Juven via PEG tube  Hydrotherapy continues but with decreasing frequency.  Surgery evaluated sacral decubitus, no plan for surgical debridement at present.   Hypoglycemic while TF off this AM Hypernatremic, noted free water flush of 200 mL q 4 hours  Labs: sodium 148 (H), BUN 114 (trending down), Creatinine wdl, CBGs 43-124 Meds: ss novolog, novolog q 4 hours, levemir, thiamine  Diet Order:   Diet Order            Diet NPO time specified  Diet effective midnight                 EDUCATION NEEDS:   Education needs have been addressed  Skin:  Skin Assessment: Skin Integrity Issues: Skin Integrity Issues:: Stage II DTI: n/a Stage II: L. Nare under NG tube Unstageable: Sacrum (DTI evolved to [facility] RN following Incisions: R leg, R groin Other: open chest  Last BM:  8/10 via rectal tube  Height:   Ht Readings from Last 1 Encounters:  08/27/19 '5\' 9"'  (1.753 m)    Weight:   Wt Readings from Last 1 Encounters:  09/25/19 73.1 kg    BMI:  Body mass index is 23.8 kg/m.  Estimated Nutritional Needs:   Kcal:  4665-9935 kcals  Protein:  130-160 g  Fluid:  >/= 1.8 L/day   Kerman Passey MS, RDN, LDN, CNSC Registered Dietitian III Clinical Nutrition RD Pager and On-Call Pager Number Located in [place]

## 2019-09-25 NOTE — Consult Note (Signed)
WOC Nurse Consult Note: Appreciate the consultation of CCS and photo provided to the EMR. Note that Plastics wishes to be reconsulted when the patient is closer to being a candidate for a flap procedure.  Collagenase and saline dampened dressings to continue with increased frequency to twice daily and hydrotherapy is decreased to three times weekly, M/W/F. PT aware.  This therapy may be discontinued upon discharge to an LTAC if not available in that setting.   Orders clarified for wound care clarified Bedside Nursing staff.  WOC nursing team will continue to follow, seeing every 7-10 days and will remain available to this patient, the nursing and medical teams.  Please re-consult if needed in between visits. Thanks, Ladona Mow, MSN, RN, GNP, [place]  Pager# [phone]

## 2019-09-25 NOTE — Progress Notes (Signed)
CPT held at this time due to patient request.

## 2019-09-25 NOTE — Progress Notes (Signed)
Physical Therapy Treatment Patient Details Name: Todd [name] MRN: [id number] DOB: 15-May-1955 Today's Date: 09/25/2019    History of Present Illness Todd [name] is a 64 y.o. male presenting with shortness of breath; noted AKI, CHF, recent PNA, elevated troponin, pulm edema/effusion, and cardiomegaly.Marland Kitchen CABG 6/24 and intubated (extubated 6/25)--went into cardiac arrest on same date with re-opening of chest for heart massage and direct epicardial paddles and place on ECMO. 08/03/19 washout out for tamponade. 7/2 sternal closure and wound vac. 7/9 Impella removed.08/20/19 Tracheostomy 08/24/19 cholecystostomy tube. 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 PMHx: HFrEF, COPD, HTN, T2DM, HLD, MDD, chronic back pain, vitamin deficiency, BPH, tobacco use disorder, marijuana use disorder    PT Comments    Pt seen in conjunction with OT for tilt in bed with bil UE and LE HEP. Pt with maintained confusion throughout session and only able to communicate with mouthing words and head nods. Pt able to tilt for 10 min limited by back pain with VSS on vent throughout. Pt with noted atrophy of bil quads and intrinsics of feet with education for need for continued HEP and movement throughout the day and standing trials without pt able to verbalize understanding. Will continue to follow to maximize function as pt able to tolerate and encourage tilting with nursing daily.   BP after 4 min tilt 110/56 (71) BP after 10 min in 42 degrees 105/53 (69) HR 74 CPAP FiO2 40%, SpO2 100%   Follow Up Recommendations  LTACH;SNF     Equipment Recommendations  Other (comment) (defer to next venue)    Recommendations for Other Services       Precautions / Restrictions Precautions Precautions: Fall;Sternal Precaution Comments: trach, chole drain, sacral wound Other Brace: Bil Prevalon    Mobility  Bed Mobility Overal bed mobility: Needs Assistance Bed Mobility: Rolling Rolling: Max  assist;+2 for physical assistance         General bed mobility comments: Moved pt up in bed with total assist of 2, max assist +2 to roll for repositioning  Transfers                 General transfer comment: Tilted bed to 42 degrees for 10 minutes.  Ambulation/Gait             General Gait Details: unable   Stairs             Wheelchair Mobility    Modified Rankin (Stroke Patients Only)       Balance                                            Cognition Arousal/Alertness: Awake/alert Behavior During Therapy: Flat affect Overall Cognitive Status: Impaired/Different from baseline Area of Impairment: Orientation;Memory                 Orientation Level: Time;Disoriented to;Place Current Attention Level: Sustained Memory: Decreased short-term memory Following Commands: Follows one step commands with increased time Safety/Judgement: Decreased awareness of safety;Decreased awareness of deficits Awareness: Intellectual Problem Solving: Slow processing;Decreased initiation;Difficulty sequencing;Requires verbal cues;Requires tactile cues General Comments: pt stating date as "Oct" "1999", Pt asking "where's my baby". Pt unable to recall date after education and focused on back and sacral pain throughout session      Exercises General Exercises - Lower Extremity Quad Sets: AROM;Both;5 reps;Standing Short Arc Quad: AROM;Both;Seated;10  reps    General Comments        Pertinent Vitals/Pain Pain Score: 6  Pain Location: sacrum with sliding and repositioning Pain Descriptors / Indicators: Grimacing;Guarding Pain Intervention(s): Limited activity within patient's tolerance;Repositioned    Home Living                      Prior Function            PT Goals (current goals can now be found in the care plan section) Progress towards PT goals: Not progressing toward goals - comment    Frequency    Min  2X/week      PT Plan Current plan remains appropriate    Co-evaluation PT/OT/SLP Co-Evaluation/Treatment: Yes Reason for Co-Treatment: Complexity of the patient's impairments (multi-system involvement);For patient/therapist safety PT goals addressed during session: Mobility/safety with mobility;Strengthening/ROM        AM-PAC PT "6 Clicks" Mobility   Outcome Measure  Help needed turning from your back to your side while in a flat bed without using bedrails?: Total Help needed moving from lying on your back to sitting on the side of a flat bed without using bedrails?: Total Help needed moving to and from a bed to a chair (including a wheelchair)?: Total Help needed standing up from a chair using your arms (e.g., wheelchair or bedside chair)?: Total Help needed to walk in hospital room?: Total Help needed climbing 3-5 steps with a railing? : Total 6 Click Score: 6    End of Session   Activity Tolerance: Patient limited by pain;Patient limited by fatigue Patient left: in bed;with call bell/phone within reach;with SCD's reapplied (in semichair position with pillow under left hip) Nurse Communication: Mobility status;Need for lift equipment PT Visit Diagnosis: Unsteadiness on feet (R26.81);Muscle weakness (generalized) (M62.81);Difficulty in walking, not elsewhere classified (R26.2)     Time: 1884-1660 PT Time Calculation (min) (ACUTE ONLY): 34 min  Charges:  $Therapeutic Activity: 8-22 mins                     Todd [name] P, PT Acute Rehabilitation Services Pager: [phone] Office: [phone]    Todd [name] B Todd [name] 09/25/2019, 11:00 AM

## 2019-09-25 NOTE — Progress Notes (Signed)
Inpatient Diabetes Program Recommendations  AACE/ADA: New Consensus Statement on Inpatient Glycemic Control (2015)  Target Ranges:  Prepandial:   less than 140 mg/dL      Peak postprandial:   less than 180 mg/dL (1-2 hours)      Critically ill patients:  140 - 180 mg/dL   Lab Results  Component Value Date   GLUCAP 87 09/25/2019   HGBA1C 9.0 (H) 07/26/2019    Review of Glycemic Control Results for Todd [name], Todd [name] (MRN [id number]) as of 09/25/2019 12:27  Ref. Range 09/25/2019 03:37 09/25/2019 07:38 09/25/2019 11:17 09/25/2019 11:48 09/25/2019 12:21  Glucose-Capillary Latest Ref Range: 70 - 99 mg/dL 185 (H) Novolog 2 units 102 (H) Novolog 2 units 43 (LL) 62 (L) 87   Inpatient Diabetes Program Recommendations:   Patient had hypoglycemia of 43. -Consider decrease in Novolog correction to moderate  Thank you, Darel Hong E. Yoshiko Keleher, RN, MSN, CDE  Diabetes Coordinator Inpatient Glycemic Control Team Team Pager [phone] (8am-5pm) 09/25/2019 12:27 PM

## 2019-09-25 NOTE — TOC Progression Note (Signed)
Transition of Care [facility]) - Progression Note    Patient Details  Name: Todd [name] MRN: [id number] Date of Birth: [birth year]  Transition of Care [facility] CM/SW Contact  Terrial Rhodes, LCSWA Phone Number: 09/25/2019, 5:01 PM  Clinical Narrative:     CSW faxed out initial referral to [facility], [facility], [facility], and [facility].  [facility] and [facility] unable to offer placement at this time.  Pending bed offers. CSW will continue to follow.  Expected Discharge Plan: Long Term Acute Care (LTAC) Barriers to Discharge: Insurance Authorization  Expected Discharge Plan and Services Expected Discharge Plan: Long Term Acute Care (LTAC)   Discharge Planning Services: CM Consult Post Acute Care Choice: Long Term Acute Care (LTAC) Living arrangements for the past 2 months: Single Family Home                                       Social Determinants of Health (SDOH) Interventions    Readmission Risk Interventions Readmission Risk Prevention Plan 09/03/2019  Transportation Screening Complete  PCP or Specialist Appt within 3-5 Days Complete  HRI or Home Care Consult Complete  Social Work Consult for Recovery Care Planning/Counseling Complete  Palliative Care Screening Complete  Medication Review Oceanographer) Complete  Some recent data might be hidden

## 2019-09-25 NOTE — Progress Notes (Signed)
Patient ID: Todd [name], male   DOB: 28-Jul-1955, 64 y.o.   MRN: [id number] EVENING ROUNDS NOTE :     [address]       [place] [address]             [phone]                 39 Days Post-Op Procedure(s) (LRB): REMOVAL OF IMPELLA LEFT VENTRICULAR ASSIST DEVICE (N/A) TRANSESOPHAGEAL ECHOCARDIOGRAM (TEE) (N/A)  Total Length of Stay:  LOS: 61 days  BP (!) 126/55   Pulse 72   Temp 98.4 F (36.9 C) (Oral)   Resp (!) 25   Ht 5\' 9"  (1.753 m)   Wt 73.1 kg   SpO2 100%   BMI 23.80 kg/m   .Intake/Output      08/16 0701 - 08/17 0700 08/17 0701 - 08/18 0700   P.O. 0    I.V. (mL/kg) 284.4 (3.9) 64.4 (0.9)   Other     NG/GT 1680    IV Piggyback 150 200   Total Intake(mL/kg) 2114.4 (28.9) 264.4 (3.6)   Urine (mL/kg/hr) 2285 (1.3) 1350 (1.8)   Drains 500 175   Stool     Total Output 2785 1525   Net -670.6 -1260.6          . sodium chloride 10 mL/hr at 09/25/19 1500  . feeding supplement (PIVOT 1.5 CAL) 1,000 mL (09/25/19 1141)  . levofloxacin (LEVAQUIN) IV Stopped (09/25/19 1446)     Lab Results  Component Value Date   WBC 15.8 (H) 09/25/2019   HGB 7.3 (L) 09/25/2019   HCT 26.0 (L) 09/25/2019   PLT 263 09/25/2019   GLUCOSE 100 (H) 09/25/2019   CHOL 203 (H) 07/27/2019   TRIG 213 (H) 08/27/2019   HDL 34 (L) 07/27/2019   LDLCALC 148 (H) 07/27/2019   ALT 112 (H) 09/24/2019   AST 113 (H) 09/24/2019   NA 148 (H) 09/25/2019   K 4.7 09/25/2019   CL 104 09/25/2019   CREATININE 1.01 09/25/2019   BUN 114 (H) 09/25/2019   CO2 37 (H) 09/25/2019   TSH 2.243 07/31/2019   INR 1.3 (H) 09/21/2019   HGBA1C 5.3 09/25/2019   No changes during the day    09/27/2019 MD  Beeper [phone] Office [phone] 09/25/2019 5:24 PM

## 2019-09-25 NOTE — Progress Notes (Signed)
SLP Cancellation Note  Patient Details Name: MANDY FITZWATER MRN: [id number] DOB: 12-14-1955   Cancelled treatment:       Reason Eval/Treat Not Completed: Other (comment). Attempted to schedule co treat with RT for inline PMSV, but due to other RT needs in unit will need to reschedule for another day. Will try again tomorrow  [name], MA CCC-SLP  Acute Rehabilitation Services Pager [phone] Office [phone]  [name] 09/25/2019, 10:46 AM

## 2019-09-25 NOTE — Progress Notes (Addendum)
39 Days Post-Op Procedure(s) (LRB): REMOVAL OF IMPELLA LEFT VENTRICULAR ASSIST DEVICE (N/A) TRANSESOPHAGEAL ECHOCARDIOGRAM (TEE) (N/A) Subjective: Patient examined and PCXR image reviewed No problems with mucus plugs overnite- currently comfortable on PS weaning through trach NSR off drips CXR with mild interstitial edema, wt down 1 kg Large sacral decubitus- seen by Gen Surg with rec for contuinue Santyl enzymatic debridement Objective: Vital signs in last 24 hours: Temp:  [97.9 F (36.6 C)-99 F (37.2 C)] 98.6 F (37 C) (08/17 0736) Pulse Rate:  [70-79] 70 (08/17 0600) Cardiac Rhythm: Normal sinus rhythm (08/16 2000) Resp:  [15-25] 20 (08/17 0600) BP: (101-133)/(50-77) 106/50 (08/17 0600) SpO2:  [90 %-100 %] 99 % (08/17 0801) FiO2 (%):  [40 %] 40 % (08/17 0801) Weight:  [73.1 kg] 73.1 kg (08/17 0500)  Hemodynamic parameters for last 24 hours:    Intake/Output from previous day: 08/16 0701 - 08/17 0700 In: 2114.4 [I.V.:284.4; NG/GT:1680; IV Piggyback:150] Out: 2785 [Urine:2285; Drains:500] Intake/Output this shift: No intake/output data recorded.       Exam    General- alert and comfortable on trach collar   Sternal incision clean, dry    Neck- no JVD, no cervical adenopathy palpable, no carotid bruit   Lungs- clear without rales, wheezes   Cor- regular rate and rhythm, no murmur , gallop   Abdomen- soft, non-tender   Extremities - warm, non-tender, minimal edema   Neuro- oriented, appropriate, no focal weakness   Lab Results: Recent Labs    09/24/19 0420 09/25/19 0654  WBC 15.0* 15.8*  HGB 7.7* 7.3*  HCT 26.9* 26.0*  PLT 256 263   BMET:  Recent Labs    09/24/19 0420 09/25/19 0654  NA 145 148*  K 5.0 4.7  CL 102 104  CO2 36* 37*  GLUCOSE 128* 100*  BUN 120* 114*  CREATININE 0.98 1.01  CALCIUM 8.3* 8.3*    PT/INR: No results for input(s): LABPROT, INR in the last 72 hours. ABG    Component Value Date/Time   PHART 7.366 09/22/2019 2147   HCO3  39.3 (H) 09/22/2019 2147   TCO2 41 (H) 09/22/2019 2147   ACIDBASEDEF 1.0 08/08/2019 1115   O2SAT 67.8 09/25/2019 0406   CBG (last 3)  Recent Labs    09/24/19 2355 09/25/19 0337 09/25/19 0738  GLUCAP 142* 110* 102*    Assessment/Plan: S/P Procedure(s) (LRB): REMOVAL OF IMPELLA LEFT VENTRICULAR ASSIST DEVICE (N/A) TRANSESOPHAGEAL ECHOCARDIOGRAM (TEE) (N/A) Cont PS wean and then trach collar PT  Levaquin for pneumonia day # 7 Anemia with good coox 70%- iron supplement   LOS: 61 days    Kathlee Nations Trigt III 09/25/2019

## 2019-09-26 LAB — COOXEMETRY PANEL
Carboxyhemoglobin: 1.9 % — ABNORMAL HIGH (ref 0.5–1.5)
Methemoglobin: 0.6 % (ref 0.0–1.5)
O2 Saturation: 75.2 %
Total hemoglobin: 8.2 g/dL — ABNORMAL LOW (ref 12.0–16.0)

## 2019-09-26 LAB — GLUCOSE, CAPILLARY
Glucose-Capillary: 205 mg/dL — ABNORMAL HIGH (ref 70–99)
Glucose-Capillary: 149 mg/dL — ABNORMAL HIGH (ref 70–99)
Glucose-Capillary: 179 mg/dL — ABNORMAL HIGH (ref 70–99)
Glucose-Capillary: 146 mg/dL — ABNORMAL HIGH (ref 70–99)
Glucose-Capillary: 141 mg/dL — ABNORMAL HIGH (ref 70–99)

## 2019-09-26 LAB — CBC
HCT: 26.6 % — ABNORMAL LOW (ref 39.0–52.0)
Hemoglobin: 7.7 g/dL — ABNORMAL LOW (ref 13.0–17.0)
MCH: 29.1 pg (ref 26.0–34.0)
MCHC: 28.9 g/dL — ABNORMAL LOW (ref 30.0–36.0)
MCV: 100.4 fL — ABNORMAL HIGH (ref 80.0–100.0)
Platelets: 291 10*3/uL (ref 150–400)
RBC: 2.65 MIL/uL — ABNORMAL LOW (ref 4.22–5.81)
RDW: 18.2 % — ABNORMAL HIGH (ref 11.5–15.5)
WBC: 15.1 10*3/uL — ABNORMAL HIGH (ref 4.0–10.5)
nRBC: 0 % (ref 0.0–0.2)

## 2019-09-26 LAB — BASIC METABOLIC PANEL
Anion gap: 6 (ref 5–15)
BUN: 116 mg/dL — ABNORMAL HIGH (ref 8–23)
CO2: 38 mmol/L — ABNORMAL HIGH (ref 22–32)
Calcium: 8.4 mg/dL — ABNORMAL LOW (ref 8.9–10.3)
Chloride: 104 mmol/L (ref 98–111)
Creatinine, Ser: 1.05 mg/dL (ref 0.61–1.24)
GFR calc Af Amer: >60 mL/min (ref >60)
GFR calc non Af Amer: >60 mL/min (ref >60)
Glucose, Bld: 205 mg/dL — ABNORMAL HIGH (ref 70–99)
Potassium: 4 mmol/L (ref 3.5–5.1)
Sodium: 148 mmol/L — ABNORMAL HIGH (ref 135–145)

## 2019-09-26 LAB — HEPATIC FUNCTION PANEL
ALT: 86 U/L — ABNORMAL HIGH (ref 0–44)
AST: 85 U/L — ABNORMAL HIGH (ref 15–41)
Albumin: 1.6 g/dL — ABNORMAL LOW (ref 3.5–5.0)
Alkaline Phosphatase: 719 U/L — ABNORMAL HIGH (ref 38–126)
Bilirubin, Direct: 3.2 mg/dL — ABNORMAL HIGH (ref 0.0–0.2)
Indirect Bilirubin: 1.7 mg/dL — ABNORMAL HIGH (ref 0.3–0.9)
Total Bilirubin: 4.9 mg/dL — ABNORMAL HIGH (ref 0.3–1.2)
Total Protein: 6 g/dL — ABNORMAL LOW (ref 6.5–8.1)

## 2019-09-26 MED ORDER — TESTOSTERONE 50 MG/5GM (1%) TD GEL
5.0000 g | Freq: Every day | TRANSDERMAL | Status: DC
  Administered 2019-09-27 – 2019-10-09 (×13): 5 g via TRANSDERMAL
  Filled 2019-09-26 (×13): qty 5

## 2019-09-26 MED ORDER — FUROSEMIDE 10 MG/ML IJ SOLN
80.0000 mg | Freq: Once | INTRAMUSCULAR | Status: AC
  Administered 2019-09-26: 80 mg via INTRAVENOUS
  Filled 2019-09-26: qty 8

## 2019-09-26 MED ORDER — ALBUMIN HUMAN 25 % IV SOLN
25.0000 g | Freq: Once | INTRAVENOUS | Status: AC
  Administered 2019-09-26: 25 g via INTRAVENOUS
  Filled 2019-09-26: qty 100

## 2019-09-26 MED ORDER — SODIUM CHLORIDE 3 % IN NEBU
4.0000 mL | INHALATION_SOLUTION | Freq: Two times a day (BID) | RESPIRATORY_TRACT | Status: DC
  Administered 2019-09-26 – 2019-10-09 (×26): 4 mL via RESPIRATORY_TRACT
  Filled 2019-09-26 (×27): qty 4

## 2019-09-26 MED ORDER — FREE WATER
300.0000 mL | Status: DC
  Administered 2019-09-26 – 2019-09-27 (×6): 300 mL

## 2019-09-26 NOTE — Progress Notes (Signed)
EVENING ROUNDS NOTE :     [address]       [place] [address]             [phone]                 40 Days Post-Op Procedure(s) (LRB): REMOVAL OF IMPELLA LEFT VENTRICULAR ASSIST DEVICE (N/A) TRANSESOPHAGEAL ECHOCARDIOGRAM (TEE) (N/A)   Total Length of Stay:  LOS: 62 days  Events:   No events today Continue wound care to sacral decub    BP (!) 132/59   Pulse 74   Temp 98.2 F (36.8 C) (Oral)   Resp 18   Ht 5\' 9"  (1.753 m)   Wt 72.5 kg   SpO2 100%   BMI 23.60 kg/m      Vent Mode: PRVC FiO2 (%):  [40 %-60 %] 40 % Set Rate:  [20 bmp] 20 bmp Vt Set:  [420 mL] 420 mL PEEP:  [5 cmH20] 5 cmH20 Pressure Support:  [10 cmH20] 10 cmH20 Plateau Pressure:  [19 cmH20-23 cmH20] 19 cmH20  . sodium chloride 250 mL (09/26/19 1636)  . feeding supplement (PIVOT 1.5 CAL) 1,000 mL (09/26/19 0502)  . levofloxacin (LEVAQUIN) IV 750 mg (09/26/19 1639)    I/O last 3 completed shifts: In: 4028.5 [I.V.:388.5; NG/GT:3290; IV Piggyback:350] Out: 5010 [Urine:3960; Drains:650; Stool:400]   CBC Latest Ref Rng & Units 09/26/2019 09/25/2019 09/24/2019  WBC 4.0 - 10.5 K/uL 15.1(H) 15.8(H) 15.0(H)  Hemoglobin 13.0 - 17.0 g/dL 7.7(L) 7.3(L) 7.7(L)  Hematocrit 39 - 52 % 26.6(L) 26.0(L) 26.9(L)  Platelets 150 - 400 K/uL 291 263 256    BMP Latest Ref Rng & Units 09/26/2019 09/25/2019 09/24/2019  Glucose 70 - 99 mg/dL 09/26/2019) 564(P) 329(J)  BUN 8 - 23 mg/dL 188(C) 166(A) 630(Z)  Creatinine 0.61 - 1.24 mg/dL 601(U 9.32 3.55  Sodium 135 - 145 mmol/L 148(H) 148(H) 145  Potassium 3.5 - 5.1 mmol/L 4.0 4.7 5.0  Chloride 98 - 111 mmol/L 104 104 102  CO2 22 - 32 mmol/L 38(H) 37(H) 36(H)  Calcium 8.9 - 10.3 mg/dL 7.32) 8.3(L) 8.3(L)    ABG    Component Value Date/Time   PHART 7.366 09/22/2019 2147   PCO2ART 68.3 (HH) 09/22/2019 2147   PO2ART 76 (L) 09/22/2019 2147   HCO3 39.3 (H) 09/22/2019 2147   TCO2 41 (H) 09/22/2019 2147   ACIDBASEDEF 1.0 08/08/2019 1115   O2SAT 75.2  09/26/2019 0440       09/28/2019, MD 09/26/2019 5:27 PM

## 2019-09-26 NOTE — Progress Notes (Signed)
RT NOTE: Pt placed on 60% ATC and is tolerating well at this time. RN aware. RT to continue to monitor.

## 2019-09-26 NOTE — Progress Notes (Addendum)
NAME:  BUEL MOLDER, MRN:  [id number], DOB:  1955-05-27, LOS: 54 ADMISSION DATE:  07/26/2019, CONSULTATION DATE:  08/03/19 REFERRING MD:  Roxan Hockey, CHIEF COMPLAINT:  ECMO   Brief History   64 yo male admitted with CHF exacerbation, found to have multi-vessel disease requiring CABG 6/24, post op VT Arrest 6/25 and cannulated for VA ECMO. S/p decannulation and impella.  History of present illness   Presented with worsening dyspnea 6/17 c/w CHF exacerbation.  LHC  with severe triple vessel disease.  Underwent CABG 6/24. Vtach arrest 6/25.  Chest opened bedside and cardiac massage initiated as well as multiple cardioversions, amiodarone, bicarb etc.  Brought to OR and cannulated for VA ECMO.  PCCM consulted to assist with management  Comorbidities include DM, heavy smoking, COPD  Past Medical History  Depression, Ischemic cardiomyopathy, HTN, [place] [facility] Events   6/24 CABG 6/25 VA cannulation 6/28 decannulated and impella placed 6/29 bedside re-exploration; s/p decannulation. Placement of impella device originally at p8 but decreased to p4 2/2 suction events overnight up to p6. Echo completed and repositioned device. Remained on considerable support with 8 epi and 46 norepi vaso 0.05. continued chest tube output with large clots noted. Heparin thru device. Replacement products ongoing. 60% 8 peep 6/30 bedside mediastinal re-exploration and clean out of hematoma with tamponade and worsening hemodynamics. Pt had large volume transfusion. rebolused with amio 2/2 nsvt episode.  Weight up 48 pounds.  Started diuresis, Lasix drip started. 7/01 iatrogenic respiratory alkalosis, vent rate decreased. 7/02 No significant issues overnight remains on pressors and Impella not tolerating tube feeds with high gastric output awaiting core track placement 7/03 started trickle TF  7/05 Swab removed, CVL and PICC placed. 7/13 Trach and BAL 7/16 Awake and tracking. No follow commands. Still  having fevers. Successful ultrasound and CT guided placement of a 10.2 French cholecystostomy tube. A small amount of aspirated bile was capped and sent to the laboratory for analysis 7/17 Off sedation, PSV.  Beginning to follow some commands occasionally intermittently.  Very jaundiced.  Growing Candida in the blood and BAL > diflucan.   7/21 Ongoing fevers, WBC increased 7/22 Persistent fevers, pan cultured with CT Chest/Head. Suspected source tracheobronchitis.  7/28 Transitioned to eliquis, jaundiced but labs improving  7/29 Agitation/restless.  Trazodone added QHS. Weaning midodrine, attempting diuresis  7/30 Increased Na, weaning on PSV.  8/01 On 40% ATC, significantly improved mental status 8/1>> ongoing attempts at trach weaning complicated by weakness, secretion management, and anxiety 8/8 started back on meropenem and milrinone 8/12 patient making slow improvements, he has been able to tolerate short course ATC 8/13 Weaned off milrinone 8/14 Code blue called for hypoxemia leading to PEA arrest. ROSC achieved after 6 min. 8/15: In  morning patient is unfazed from his recent events and continues asking for water to drink. Awake, alert and follows commands. 8/16 seen by surg per wound care recs-->"Do not recommend any acute surgical intervention. Will ultimately defer wound care recommendations to the wound care team, but I think it is reasonable to decrease hydrotherapy to 3x per week. I would increase dressing changes to BID and continue santyl for enzymatic debridement". 8/17: Using albumin and Lasix to try to mobilize anasarca Consults:  Advanced heart failure, PCCM, TCTS  Procedures:  LUE PICC 7/5 >>7/21 R Brachial ALine 7/9 >>7/27 Chest Tubes >> L IJ CVC 7/21 >>7/27 Peg tube 7/27 >>  Significant Diagnostic Tests:  6/22 spirometry with restrictive physiology, preserved FEV1/FVC 6/18 echo: LVEF 20-25%, grade  I diastolic dysfunction 0/73 US Abdomen cholelithiasis without  cholecystitis. Mild wall thinking in setting of liver disease and ascites.  7/10 LE [place] negative for DVT  7/20 CT ABD w contrast >> stable position of the percutaneous cholecystostomy tube with complete decompression of the gallbladder, trace residual free fluid within the RUQ, decreased since prior, trace bilateral pleural effusions L>R, scattered areas of ground glass airspace disease within the RML, RLL 7/22 CT Chest w/o Contrast >> Retrosternal fluid collection, non specific.  Bilateral pleural effusions, portions loculated.   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 Stable bilateral mastoid and middle ear effusions.   Micro Data:  7/3 RQ> Few candida tropicalis, likely contaminant 7/12 BAL Cx> Candida tropicalis and albicans, likely contaminant 7/14 BCx >> candida parapsilosis 7/17 BCx >> neg resp 8/7 >> stenotrophomonas  Antimicrobials:  Meropenem 8/8 >>8/10 Diflucan  7/16 >> 8/9 vanc 7/22 >>8/10 levaquin 8/10 >>  Interim history/subjective:  Looks fairly comfortable on pressure support ventilation.  Does endorse headache today  Objective   Blood pressure (Abnormal) 123/59, pulse 72, temperature 98.1 F (36.7 C), temperature source Oral, resp. rate 18, height _0  (1.753 m), weight 72.5 kg, SpO2 99 %.    Vent Mode: PSV;CPAP FiO2 (%):  [40 %] 40 % Set Rate:  [20 bmp] 20 bmp Vt Set:  [420 mL] 420 mL PEEP:  [5 cmH20] 5 cmH20 Pressure Support:  [10 cmH20] 10 cmH20 Plateau Pressure:  [19 cmH20-23 cmH20] 22 cmH20   Intake/Output Summary (Last 24 hours) at 09/26/2019 0904 Last data filed at 09/26/2019 7106 Gross per 24 hour  Intake 1973.95 ml  Output 3625 ml  Net -1651.05 ml   Filed Weights   09/24/19 0600 09/25/19 0500 09/26/19 0600  Weight: 74.8 kg 73.1 kg 72.5 kg   Physical Exam:  General this is a pleasant 64 year old white male he is currently on pressure support ventilation and not in  acute distress HEENT normal cephalic with the exception of temporal wasting.  Mucous membranes are moist.  Sclera nonicteric.  No JVD.  Size 8 cuffed tracheostomy is unremarkable Pulmonary: Some scattered rhonchi, no accessory use on current pressure support of 10/PEEP 5.  Tidal volume in the 500 range he denies dyspnea with this level of support Cardiac: Regular rate and rhythm Abdomen soft.  Percutaneous drain with bilious fluid.  Liquid stools. Extremities.  Warm dry brisk capillary refill does exhibit scattered areas of ecchymosis.  Pitting edema particularly in the upper thigh and pelvis area. GU: Clear yellow Neuro/psych: Awake oriented follows commands still with generalized weakness, no focal deficits appreciated  Resolved Hospital Problem list   Suspected HCAP Acute kidney injury Hypernatremia Acute Metabolic Encephalopathy  -CTH without acute intracranial pathology. EEG negative 7/23.  Shock Heparin-induced thrombocytopenia >> resolved, required bivalirudin 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 -Opthalmology evaluated , diflucan x 3 weeks PEA arrest 8/14 preceded by hypoxemia Assessment & Plan:   Acute hypoxic respiratory failure w/ need for mechanical ventilation d/t stenotrophomonas HCAP and severe weakness and deconditioning  Tracheostomy dependence  Wbc ct stable, no fever spike CXR personally reviewed. Diffuse bilateral airspace disease. Suspect some of this is slow to resolve ALI after HCAP. Aeration a little better (minimally) Plan Day #9 of 14 amiodarone Continue pressure support, and anticipate trialing aerosol trach collar today Continue routine trach care Continue pulmonary hygiene Continue hypertonic saline Continue diuresis He is due for tracheostomy change today 7.5 cuffed. Due  recent  mucous plugging episode almost leading to cardiac arrest I think we should not go to 6 yet My hope is we can at least try in line with Passy-Muir valve if he cannot  tolerate PMV on aerosol trach collar   Cardiogenic/hemorrhagic shock status post CABG complicated by VT arrest,  S/p VA ECMO (decanulated 6/28).  S/p impella placement (6/28> 7/9) Plan Per thoracic team  Atrial flutter , reverted to nSR Plan Continue aspirin, telemetry and amiodarone Need to decide on systemic anticoagulation at some point given history of atrial fibrillation  Fluid and electrolyte imbalance: Total body overload Plan His water deficit is approximately 2.1 L, however his sodium has been unchanged in spite of appropriate water replacement regimen, will increase to 300 every 4 hours Repeating albumin and Lasix today as BUN and creatinine allow Suspect we will give him a diuretic holiday once again on 8/19  Hyperbilirubinemia due to acalculous cholecystitis vs drug-induced cholestasis Possible cirrhosis per 7/8 [place]  Status post percutaneous GB drain by IR Jaundice-bilirubin  LFTs stable  Plan Drain management per interventional radiology Intermittent LFTs  DM II with fluctuant hypo/hyperglycemia ->excellent glycemic control Plan Continue current sliding scale and Levemir regimen  Acute blood loss anemia and critical illness anemia , sp 1 U PRBC 8/5 & 8/9  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  Hemoglobin has been stable, will continue to intermittently check CBC  Anxiety -likely ICU related stress disorder-->seems very calm and appropriate at this point  Plan Continue Prozac, Seroquel, and Klonopin.  He is doing well with this regimen   Sacral Decubitus  Plan Continue as directed by wound team  Maximize nutrition  Pressure relief interventions ordered   Deconditioning of Critical Illness  Plan I think she will need extensive physical therapy and Occupational Therapy, this will almost certainly need to occur in a LTAC setting  Small left adrenal mass seen on CT scan of  the abdomen August 24, 2019 Plan Out pt follow up   Daily Goals Checklist  Pain/Anxiety/Delirium protocol (if indicated): see above  VAP protocol (if indicated): bundle in place.  DVT prophylaxis:arixtra (resumed 8/16) Nutrition Status: TF  GI prophylaxis: PPI Urinary catheter: n/a Central lines: removed Glucose control: See above Mobility/therapy needs: PT for ROM  Code Status: Full  Family Communication: per primary Disposition: ICU -- await LTACH   Critical care x 31 min  Erick Colace ACNP-BC [place] Pager # [phone] OR # [phone] if no answer

## 2019-09-26 NOTE — Progress Notes (Signed)
Physical Therapy Wound Treatment Patient Details  Name: Todd [name] MRN: [id number] Date of Birth: 01/04/1956  Today's Date: 09/26/2019 Time: 1032-1115 Time Calculation (min): 43 min  Subjective  Subjective: Patient unable to speak; able to mouth response, and squeeze hand to pain with pulse lavage Patient and Family Stated Goals: unable  Date of Onset:  (unknown) Prior Treatments: foam dressing  Pain Score:  Pt with grimace during pulse lavage.   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 has evolved into stage 4 when assessed on 8/9 (Active)  Wound Image   09/26/19 1500  Dressing Type ABD;Gauze (Comment);Normal saline moist dressing;Barrier Film (skin prep) 09/26/19 1500  Dressing Changed 09/26/19 1500  Dressing Change Frequency Twice a day 09/26/19 1500  State of Healing Non-healing 09/26/19 1500  Site / Wound Assessment Dressing in place / Unable to assess 09/26/19 0800  % Wound base Red or Granulating 10% 09/26/19 1500  % Wound base Yellow/Fibrinous Exudate 40% 09/26/19 1500  % Wound base Black/Eschar 45% 09/26/19 1500  % Wound base Other/Granulation Tissue (Comment) 5% 09/26/19 1500  Peri-wound Assessment Erythema (blanchable) 09/26/19 1500  Wound Length (cm) 6.4 cm 09/26/19 1500  Wound Width (cm) 5.2 cm 09/26/19 1500  Wound Depth (cm) 2.3 cm 09/26/19 1500  Wound Surface Area (cm^2) 33.28 cm^2 09/26/19 1500  Wound Volume (cm^3) 76.54 cm^3 09/26/19 1500  Tunneling (cm) 7 cm at 5 o'clock 09/26/19 1500  Undermining (cm) 1.2-2.2 cm around entire wound 09/26/19 1500  Margins Unattached edges (unapproximated) 09/26/19 1500  Drainage Amount Moderate 09/26/19 1500  Drainage Description Serosanguineous 09/26/19 1500  Treatment Debridement (Selective);Hydrotherapy (Pulse lavage);Packing (Saline gauze);Other (Comment) 09/26/19 1500   Santyl applied to necrotic tissue    Hydrotherapy Pulsed lavage  therapy - wound location: sacrum Pulsed Lavage with Suction (psi): 12 psi Pulsed Lavage with Suction - Normal Saline Used: 1000 mL Pulsed Lavage Tip: Tip with splash shield Selective Debridement Selective Debridement - Location: sacrum Selective Debridement - Tools Used: Forceps;Scissors Selective Debridement - Tissue Removed: yellow/gray slough   Wound Assessment and Plan  Wound Therapy - Assess/Plan/Recommendations Wound Therapy - Clinical Statement: Pt bed soaked in urine on entry, area around wound is very moist. During assessment today tunnel found at 5 o'clock that is 7 cm long. Pt will continue to benefit from removal of necrotic tissue to promote wound healing.  Wound Therapy - Functional Problem List: decreased mobility Factors Delaying/Impairing Wound Healing: Diabetes Mellitus;Incontinence;Immobility;Multiple medical problems Hydrotherapy Plan: Debridement;Dressing change;Patient/family education;Pulsatile lavage with suction Wound Therapy - Frequency: 3X / week Wound Therapy - Follow Up Recommendations: Other (comment) (LTACH) Wound Plan: see above  Wound Therapy Goals- Improve the function of patient's integumentary system by progressing the wound(s) through the phases of wound healing (inflammation - proliferation - remodeling) by: Decrease Necrotic Tissue to: 60 Decrease Necrotic Tissue - Progress: Not progressing Increase Granulation Tissue to: 40 Increase Granulation Tissue - Progress: Mot progressing Goals/treatment plan/discharge plan were made with and agreed upon by patient/family: No, Patient unable to participate in goals/treatment/discharge plan and family unavailable Time For Goal Achievement: 7 days Wound Therapy - Potential for Goals: Poor  Goals will be updated until maximal potential achieved or discharge criteria met.  Discharge criteria: when goals achieved, discharge from hospital, MD decision/surgical intervention, no progress towards goals,  refusal/missing three consecutive treatments without notification or medical reason.  GP     B. Van Fleet PT, DPT Acute Rehabilitation Services Pager [phone] Office [phone]  [place] 09/26/2019, 3:21 PM

## 2019-09-26 NOTE — Progress Notes (Signed)
RT NOTE: Pts trach changed to #7.5 flexible shiley with CCM NP at bedside.  No complications noted. Pt has good volume return on vent and catheter passes with no complication. RT to continue to monitor.

## 2019-09-26 NOTE — Progress Notes (Signed)
RT NOTE: RT called to pts room for sats in low 80's. Pt placed on full vent support with 100% FIO2 and suctioned. Pt sats increased to 98%. FIO2 decreased to 40% and sats currently 98%.

## 2019-09-26 NOTE — TOC Progression Note (Addendum)
Transition of Care [facility]) - Progression Note    Patient Details  Name: Todd [name] MRN: [id number] Date of Birth: January 30, 1956  Transition of Care [facility]) CM/SW Contact  Terrial Rhodes, LCSWA Phone Number: 09/26/2019, 2:47 PM  Clinical Narrative:     Update 8/18 4:44pm- CSW spoke with patients wife Todd [name] and patients spouse declined placement with Acute medical.Patient spouse says it is too far to travel for her. Patients spouse is hopeful for LTAC at [facility]. CSW spoke with Irving Burton with [facility] and Irving Burton is going to try and get patient placement at [facility]. Irving Burton will follow up with CSW.  CSW will continue to follow. [facility], [place], and [address] unable to make bed offer for patient.  Lurena Joiner with [facility] in [facility] is reviewing clinicals for possible SNF placement for patient. Lurena Joiner will come see patient tomorrow to see if she can make bed offer.  CSW will continue to follow.   Expected Discharge Plan: Long Term Acute Care (LTAC) Barriers to Discharge: Insurance Authorization  Expected Discharge Plan and Services Expected Discharge Plan: Long Term Acute Care (LTAC)   Discharge Planning Services: CM Consult Post Acute Care Choice: Long Term Acute Care (LTAC) Living arrangements for the past 2 months: Single Family Home                                       Social Determinants of Health (SDOH) Interventions    Readmission Risk Interventions Readmission Risk Prevention Plan 09/03/2019  Transportation Screening Complete  PCP or Specialist Appt within 3-5 Days Complete  HRI or Home Care Consult Complete  Social Work Consult for Recovery Care Planning/Counseling Complete  Palliative Care Screening Complete  Medication Review Oceanographer) Complete  Some recent data might be hidden

## 2019-09-26 NOTE — Progress Notes (Signed)
      [address]       [place] [address]             [phone]                 40 Days Post-Op Procedure(s) (LRB): REMOVAL OF IMPELLA LEFT VENTRICULAR ASSIST DEVICE (N/A) TRANSESOPHAGEAL ECHOCARDIOGRAM (TEE) (N/A)   Events: No events overnight ___________________________________________ Vitals: BP 130/76   Pulse 76   Temp 98.2 F (36.8 C) (Oral)   Resp (!) 21   Ht 5\' 9"  (1.753 m)   Wt 72.5 kg   SpO2 97%   BMI 23.60 kg/m   - Neuro: arousable, following commands  - Cardiovascular: regular  Drips: none.      - Pulm: Trached.  EWOB Vent Mode: PRVC FiO2 (%):  [40 %-60 %] 40 % Set Rate:  [20 bmp] 20 bmp Vt Set:  [420 mL] 420 mL PEEP:  [5 cmH20] 5 cmH20 Pressure Support:  [10 cmH20] 10 cmH20 Plateau Pressure:  [19 cmH20-23 cmH20] 22 cmH20  ABG    Component Value Date/Time   PHART 7.366 09/22/2019 2147   PCO2ART 68.3 (HH) 09/22/2019 2147   PO2ART 76 (L) 09/22/2019 2147   HCO3 39.3 (H) 09/22/2019 2147   TCO2 41 (H) 09/22/2019 2147   ACIDBASEDEF 1.0 08/08/2019 1115   O2SAT 75.2 09/26/2019 0440    - Abd: soft - Extremity: warm  .Intake/Output      08/17 0701 - 08/18 0700 08/18 0701 - 08/19 0700   P.O.     I.V. (mL/kg) 164 (2.3)    NG/GT 1610    IV Piggyback 200    Total Intake(mL/kg) 1974 (27.2)    Urine (mL/kg/hr) 2775 (1.6) 500 (1.1)   Drains 450    Stool 400    Total Output 3625 500   Net -1651.1 -500        Urine Occurrence  2 x      _______________________________________________________________ Labs: CBC Latest Ref Rng & Units 09/26/2019 09/25/2019 09/24/2019  WBC 4.0 - 10.5 K/uL 15.1(H) 15.8(H) 15.0(H)  Hemoglobin 13.0 - 17.0 g/dL 7.7(L) 7.3(L) 7.7(L)  Hematocrit 39 - 52 % 26.6(L) 26.0(L) 26.9(L)  Platelets 150 - 400 K/uL 291 263 256   CMP Latest Ref Rng & Units 09/26/2019 09/25/2019 09/24/2019  Glucose 70 - 99 mg/dL 09/26/2019) 315(V) 761(Y)  BUN 8 - 23 mg/dL 073(X) 106(Y) 694(W)  Creatinine 0.61 - 1.24 mg/dL 546(E 7.03 5.00   Sodium 135 - 145 mmol/L 148(H) 148(H) 145  Potassium 3.5 - 5.1 mmol/L 4.0 4.7 5.0  Chloride 98 - 111 mmol/L 104 104 102  CO2 22 - 32 mmol/L 38(H) 37(H) 36(H)  Calcium 8.9 - 10.3 mg/dL 9.38) 8.3(L) 8.3(L)  Total Protein 6.5 - 8.1 g/dL 6.0(L) - 5.7(L)  Total Bilirubin 0.3 - 1.2 mg/dL 4.9(H) - 5.5(H)  Alkaline Phos 38 - 126 U/L 719(H) - 927(H)  AST 15 - 41 U/L 85(H) - 113(H)  ALT 0 - 44 U/L 86(H) - 112(H)    CXR: pending  _______________________________________________________________  Assessment and Plan:  s/p CABG on 6/24, VA ecmo, impella  Neuro: pain controlled.  mentating well CV: Good hemodynamics Pulm: continue pulmonary toilet.  vent wean as tolerated.  Renal: creat stable   Good uop GI: on tube feeds.   Heme: stable.  plts recovered.   ID: on levaquin for pneumonia.  Endo: SSI Dispo: continue ICU care.  7/24, MD 09/26/2019 1:09 PM

## 2019-09-26 NOTE — Progress Notes (Signed)
SLP Cancellation Note  Patient Details Name: Todd [name] MRN: [id number] DOB: [birth year]   Cancelled treatment:       Reason Eval/Treat Not Completed: Other (comment). Pt currently with wound care team. Plan is for a trach change today or tomorrow to a smaller size. RT and SLP will plan to f/u tomorrow for inline attempt.    Robbert Langlinais, Riley Nearing 09/26/2019, 11:10 AM

## 2019-09-27 ENCOUNTER — Inpatient Hospital Stay (HOSPITAL_COMMUNITY): Payer: Medicare Other

## 2019-09-27 LAB — BASIC METABOLIC PANEL
Anion gap: 8 (ref 5–15)
BUN: 114 mg/dL — ABNORMAL HIGH (ref 8–23)
CO2: 40 mmol/L — ABNORMAL HIGH (ref 22–32)
Calcium: 8.6 mg/dL — ABNORMAL LOW (ref 8.9–10.3)
Chloride: 105 mmol/L (ref 98–111)
Creatinine, Ser: 1.06 mg/dL (ref 0.61–1.24)
GFR calc Af Amer: >60 mL/min (ref >60)
GFR calc non Af Amer: >60 mL/min (ref >60)
Glucose, Bld: 151 mg/dL — ABNORMAL HIGH (ref 70–99)
Potassium: 3.7 mmol/L (ref 3.5–5.1)
Sodium: 153 mmol/L — ABNORMAL HIGH (ref 135–145)

## 2019-09-27 LAB — COOXEMETRY PANEL
Carboxyhemoglobin: 1.6 % — ABNORMAL HIGH (ref 0.5–1.5)
Methemoglobin: 0.7 % (ref 0.0–1.5)
O2 Saturation: 68.5 %
Total hemoglobin: 13.5 g/dL (ref 12.0–16.0)

## 2019-09-27 LAB — GLUCOSE, CAPILLARY
Glucose-Capillary: 132 mg/dL — ABNORMAL HIGH (ref 70–99)
Glucose-Capillary: 135 mg/dL — ABNORMAL HIGH (ref 70–99)
Glucose-Capillary: 137 mg/dL — ABNORMAL HIGH (ref 70–99)
Glucose-Capillary: 141 mg/dL — ABNORMAL HIGH (ref 70–99)
Glucose-Capillary: 156 mg/dL — ABNORMAL HIGH (ref 70–99)
Glucose-Capillary: 160 mg/dL — ABNORMAL HIGH (ref 70–99)
Glucose-Capillary: 231 mg/dL — ABNORMAL HIGH (ref 70–99)

## 2019-09-27 LAB — HEPATIC FUNCTION PANEL
ALT: 79 U/L — ABNORMAL HIGH (ref 0–44)
AST: 84 U/L — ABNORMAL HIGH (ref 15–41)
Albumin: 1.8 g/dL — ABNORMAL LOW (ref 3.5–5.0)
Alkaline Phosphatase: 693 U/L — ABNORMAL HIGH (ref 38–126)
Bilirubin, Direct: 2.8 mg/dL — ABNORMAL HIGH (ref 0.0–0.2)
Indirect Bilirubin: 1.6 mg/dL — ABNORMAL HIGH (ref 0.3–0.9)
Total Bilirubin: 4.4 mg/dL — ABNORMAL HIGH (ref 0.3–1.2)
Total Protein: 5.9 g/dL — ABNORMAL LOW (ref 6.5–8.1)

## 2019-09-27 LAB — CBC
HCT: 25.4 % — ABNORMAL LOW (ref 39.0–52.0)
HCT: 24 % — ABNORMAL LOW (ref 39.0–52.0)
Hemoglobin: 7.3 g/dL — ABNORMAL LOW (ref 13.0–17.0)
Hemoglobin: 6.8 g/dL — CL (ref 13.0–17.0)
MCH: 29.2 pg (ref 26.0–34.0)
MCH: 28.2 pg (ref 26.0–34.0)
MCHC: 28.7 g/dL — ABNORMAL LOW (ref 30.0–36.0)
MCHC: 28.3 g/dL — ABNORMAL LOW (ref 30.0–36.0)
MCV: 101.6 fL — ABNORMAL HIGH (ref 80.0–100.0)
MCV: 99.6 fL (ref 80.0–100.0)
Platelets: 294 10*3/uL (ref 150–400)
Platelets: 221 10*3/uL (ref 150–400)
RBC: 2.5 MIL/uL — ABNORMAL LOW (ref 4.22–5.81)
RBC: 2.41 MIL/uL — ABNORMAL LOW (ref 4.22–5.81)
RDW: 18.2 % — ABNORMAL HIGH (ref 11.5–15.5)
RDW: 18.7 % — ABNORMAL HIGH (ref 11.5–15.5)
WBC: 15.4 10*3/uL — ABNORMAL HIGH (ref 4.0–10.5)
WBC: 13.8 10*3/uL — ABNORMAL HIGH (ref 4.0–10.5)
nRBC: 0 % (ref 0.0–0.2)
nRBC: 0 % (ref 0.0–0.2)

## 2019-09-27 LAB — PREPARE RBC (CROSSMATCH)

## 2019-09-27 LAB — PROTIME-INR
INR: 1.3 — ABNORMAL HIGH (ref 0.8–1.2)
Prothrombin Time: 15.3 seconds — ABNORMAL HIGH (ref 11.4–15.2)

## 2019-09-27 LAB — APTT: aPTT: 42 seconds — ABNORMAL HIGH (ref 24–36)

## 2019-09-27 MED ORDER — SODIUM CHLORIDE 0.9% IV SOLUTION: Freq: Once | INTRAVENOUS | Status: DC

## 2019-09-27 MED ORDER — POTASSIUM CHLORIDE 20 MEQ PO PACK
40.0000 meq | PACK | Freq: Once | ORAL | Status: AC
  Administered 2019-09-27: 40 meq via ORAL
  Filled 2019-09-27: qty 2

## 2019-09-27 MED ORDER — FREE WATER
300.0000 mL | Status: DC
  Administered 2019-09-27 – 2019-09-29 (×14): 300 mL

## 2019-09-27 NOTE — Progress Notes (Addendum)
eLink Physician-Brief Progress Note Patient Name: Todd [name] DOB: [birth year] MRN: [id number]   Date of Service  09/27/2019  HPI/Events of Note  Baseline hemoglobin was 7.3 gm then he received 1 unit PRBC, post-transfusion hemoglobin is 6.8 gm, in the context of bleeding earlier from his tracheostomy.  eICU Interventions  Stat portable CXR to assess for noticeable pulmonary hemorrhage warranting bronchoscopy tonight, will transfuse 2 more units PRBC to maintain a margin of safety.        Migdalia Dk 09/27/2019, 9:39 PM

## 2019-09-27 NOTE — Progress Notes (Signed)
NAME:  Todd [name], MRN:  [id number], DOB:  12-Apr-1955, LOS: 32 ADMISSION DATE:  07/26/2019, CONSULTATION DATE:  08/03/19 REFERRING MD:  Roxan Hockey, CHIEF COMPLAINT:  ECMO   Brief History   64 yo male admitted with CHF exacerbation, found to have multi-vessel disease requiring CABG 6/24, post op VT Arrest 6/25 and cannulated for VA ECMO. S/p decannulation and impella.  History of present illness   Presented with worsening dyspnea 6/17 c/w CHF exacerbation.  LHC  with severe triple vessel disease.  Underwent CABG 6/24. Vtach arrest 6/25.  Chest opened bedside and cardiac massage initiated as well as multiple cardioversions, amiodarone, bicarb etc.  Brought to OR and cannulated for VA ECMO.  PCCM consulted to assist with management  Comorbidities include DM, heavy smoking, COPD  Past Medical History  Depression, Ischemic cardiomyopathy, HTN, [facility] Events   6/24 CABG 6/25 VA cannulation 6/28 decannulated and impella placed 6/29 bedside re-exploration; s/p decannulation. Placement of impella device originally at p8 but decreased to p4 2/2 suction events overnight up to p6. Echo completed and repositioned device. Remained on considerable support with 8 epi and 46 norepi vaso 0.05. continued chest tube output with large clots noted. Heparin thru device. Replacement products ongoing. 60% 8 peep 6/30 bedside mediastinal re-exploration and clean out of hematoma with tamponade and worsening hemodynamics. Pt had large volume transfusion. rebolused with amio 2/2 nsvt episode.  Weight up 48 pounds.  Started diuresis, Lasix drip started. 7/01 iatrogenic respiratory alkalosis, vent rate decreased. 7/02 No significant issues overnight remains on pressors and Impella not tolerating tube feeds with high gastric output awaiting core track placement 7/03 started trickle TF  7/05 Swab removed, CVL and PICC placed. 7/13 Trach and BAL 7/16 Awake and tracking. No follow commands. Still  having fevers. Successful ultrasound and CT guided placement of a 10.2 French cholecystostomy tube. A small amount of aspirated bile was capped and sent to the laboratory for analysis 7/17 Off sedation, PSV.  Beginning to follow some commands occasionally intermittently.  Very jaundiced.  Growing Candida in the blood and BAL > diflucan.   7/21 Ongoing fevers, WBC increased 7/22 Persistent fevers, pan cultured with CT Chest/Head. Suspected source tracheobronchitis.  7/28 Transitioned to eliquis, jaundiced but labs improving  7/29 Agitation/restless.  Trazodone added QHS. Weaning midodrine, attempting diuresis  7/30 Increased Na, weaning on PSV.  8/01 On 40% ATC, significantly improved mental status 8/1>> ongoing attempts at trach weaning complicated by weakness, secretion management, and anxiety 8/8 started back on meropenem and milrinone 8/12 patient making slow improvements, he has been able to tolerate short course ATC 8/13 Weaned off milrinone 8/14 Code blue called for hypoxemia leading to PEA arrest. ROSC achieved after 6 min. 8/15: In  morning patient is unfazed from his recent events and continues asking for water to drink. Awake, alert and follows commands. 8/16 seen by surg per wound care recs-->"Do not recommend any acute surgical intervention. Will ultimately defer wound care recommendations to the wound care team, but I think it is reasonable to decrease hydrotherapy to 3x per week. I would increase dressing changes to BID and continue santyl for enzymatic debridement". 8/17: Using albumin and Lasix to try to mobilize anasarca 8/18: Tracheostomy downsized to 7.5.  Attempting short trials of aerosol trach collar 8/19: Now in a negative fluid balance.  Sodium continuing to rise.  Holding diuretics continuing to work on physical therapy and reconditioning Consults:  Advanced heart failure, PCCM, TCTS  Procedures:  LUE PICC  7/5 >>7/21 R Brachial ALine 7/9 >>7/27 Chest Tubes >> out L  IJ CVC 7/21 >>7/27 Peg tube 7/27 >> Tracheostomy downsized to 7.5 on 8/18 Significant Diagnostic Tests:  6/22 spirometry with restrictive physiology, preserved FEV1/FVC 6/18 echo: LVEF 95-62%, grade I diastolic dysfunction 1/30 US Abdomen cholelithiasis without cholecystitis. Mild wall thinking in setting of liver disease and ascites.  7/10 LE [place] negative for DVT  7/20 CT ABD w contrast >> stable position of the percutaneous cholecystostomy tube with complete decompression of the gallbladder, trace residual free fluid within the RUQ, decreased since prior, trace bilateral pleural effusions L>R, scattered areas of ground glass airspace disease within the RML, RLL 7/22 CT Chest w/o Contrast >> Retrosternal fluid collection, non specific.  Bilateral pleural effusions, portions loculated.   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 Stable bilateral mastoid and middle ear effusions.   Micro Data:  7/3 RQ> Few candida tropicalis, likely contaminant 7/12 BAL Cx> Candida tropicalis and albicans, likely contaminant 7/14 BCx >> candida parapsilosis 7/17 BCx >> neg resp 8/7 >> stenotrophomonas  Antimicrobials:  Meropenem 8/8 >>8/10 Diflucan  7/16 >> 8/9 vanc 7/22 >>8/10 levaquin 8/10 >>  Interim history/subjective:  Denies discomfort.  Breathing comfortably.  Objective   Blood pressure (Abnormal) 109/54, pulse 61, temperature 98.3 F (36.8 C), temperature source Oral, resp. rate 17, height _0  (1.753 m), weight 71.2 kg, SpO2 99 %.    Vent Mode: PSV;CPAP FiO2 (%):  [40 %-60 %] 40 % Set Rate:  [20 bmp] 20 bmp Vt Set:  [420 mL] 420 mL PEEP:  [5 cmH20] 5 cmH20 Pressure Support:  [8 cmH20] 8 cmH20 Plateau Pressure:  [19 cmH20] 19 cmH20   Intake/Output Summary (Last 24 hours) at 09/27/2019 0942 Last data filed at 09/27/2019 0517 Gross per 24 hour  Intake 1901.5 ml  Output 2565 ml  Net -663.5 ml   Filed  Weights   09/25/19 0500 09/26/19 0600 09/27/19 0400  Weight: 73.1 kg 72.5 kg 71.2 kg   Physical Exam:  General this is a significantly deconditioned 64 year old white male who remains on ventilatory support HEENT: Normocephalic with the exception of temporal wasting.  Now has a size 7.5 cuffed tracheostomy in place. Pulmonary: Diminished, some occasional scattered rhonchi.  Currently on pressure support 10, PEEP 5.  Tidal volume in the mid 400 range without accessory use Cardiac: Regular rate and rhythm with audible systolic murmur Abdomen soft not tender no organomegaly Extremities warm dry with improving edema brisk capillary refill multiple areas of ecchymosis Neuro awake appropriate follows command profoundly weak GU yellow urine  Resolved Hospital Problem list   Suspected HCAP Acute kidney injury Hypernatremia Acute Metabolic Encephalopathy  -CTH without acute intracranial pathology. EEG negative 7/23.  Shock Heparin-induced thrombocytopenia >> resolved, required bivalirudin 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 -Opthalmology evaluated , diflucan x 3 weeks PEA arrest 8/14 preceded by hypoxemia Assessment & Plan:   Acute hypoxic respiratory failure w/ need for mechanical ventilation d/t stenotrophomonas HCAP and severe weakness and deconditioning  Tracheostomy dependence  Wbc ct stable, no fever spike CXR personally reviewed. Diffuse bilateral airspace disease. Suspect some of this is slow to resolve ALI after HCAP. Aeration a little better (minimally) Plan Day #10 of 14 day #9 of 14 Levaquin Daily attempts for aerosol trach collar, alternating with pressure support ventilation Routine tracheostomy care Order placed to attempt inline Passy-Muir valve trials Need to get him out of bed  Cardiogenic/hemorrhagic shock  status post CABG complicated by VT arrest,  S/p VA ECMO (decanulated 6/28).  S/p impella placement (6/28> 7/9) Plan Per thoracic team  Atrial  flutter , reverted to nSR Plan Continue aspirin, amiodarone and telemetry  Deferring systemic anticoagulation    Fluid and electrolyte imbalance: Total body overload Plan Sodium continues to rise, will hold off on Lasix today Continue free water replacement A.m. 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  No change in his regimen for now seems to be doing well with it    Sacral Decubitus  Plan Continue wound care as directed by wound ostomy team Maximize nutrition Pressure relief interventions ordered Continue Deconditioning of Critical Illness  Plan He will need extensive physical therapy, Occupational Therapy and LTAC setting  Small left adrenal mass seen on CT scan of the abdomen August 24, 2019 Plan Outpatient follow-up  Daily Goals Checklist  Pain/Anxiety/Delirium protocol (if indicated): see above  VAP protocol (if indicated): bundle in place.  DVT prophylaxis:arixtra (resumed 8/16) Nutrition Status: TF  GI prophylaxis: PPI Urinary catheter: n/a Central lines: removed Glucose control: See above Mobility/therapy needs: PT for ROM  Code Status: Full  Family Communication: per  primary Disposition: ICU -- await LTACH  cct 32 min  Erick Colace ACNP-BC [place] Pager # [phone] OR # [phone] if no answer

## 2019-09-27 NOTE — Progress Notes (Signed)
eLink Physician-Brief Progress Note Patient Name: Todd [name] DOB: [birth year] MRN: [id number]   Date of Service  09/27/2019  HPI/Events of Note  Bedside RN suctioned  Some blood from patient's ETT. She just sent a post-transfusion CBC after 1 unit PRBC. No recent coags sent.  eICU Interventions  Will order PT, INR, PTT and a portable CXR. Pt has a CBC pending.        Migdalia Dk 09/27/2019, 8:39 PM

## 2019-09-27 NOTE — Progress Notes (Signed)
41 Days Post-Op Procedure(s) (LRB): REMOVAL OF IMPELLA LEFT VENTRICULAR ASSIST DEVICE (N/A) TRANSESOPHAGEAL ECHOCARDIOGRAM (TEE) (N/A) Subjective: Trach, PEG, Biliary drain Fully responsive Did not tolerate trach trial yesterday due to inc RR Hb slowly dropping to 7.2 - will give 1 unit  Objective: Vital signs in last 24 hours: Temp:  [96.9 F (36.1 C)-98.3 F (36.8 C)] 98.3 F (36.8 C) (08/19 0807) Pulse Rate:  [60-78] 61 (08/19 0747) Cardiac Rhythm: Normal sinus rhythm (08/19 0500) Resp:  [13-25] 17 (08/19 0747) BP: (99-140)/(48-76) 109/54 (08/19 0700) SpO2:  [86 %-100 %] 99 % (08/19 0759) FiO2 (%):  [40 %-60 %] 40 % (08/19 0759) Weight:  [71.2 kg] 71.2 kg (08/19 0400)  Hemodynamic parameters for last 24 hours:  stable off drips  Intake/Output from previous day: 08/18 0701 - 08/19 0700 In: 1901.5 [P.O.:300; I.V.:123.8; NG/GT:1327.7; IV Piggyback:150] Out: 2815 [Urine:2325; Drains:290; Stool:200] Intake/Output this shift: No intake/output data recorded.      Exam    General- alert and comfortable on trach    Surgical incisions clean and dry    Neck- no JVD, no cervical adenopathy palpable, no carotid bruit   Lungs- clear without rales, wheezes   Cor- regular rate and rhythm, no murmur , gallop   Abdomen- soft, non-tender - drains in place   Extremities - warm, non-tender, minimal edema   Neuro- oriented, appropriate, no focal weakness    Lab Results: Recent Labs    09/26/19 0440 09/27/19 0500  WBC 15.1* 15.4*  HGB 7.7* 7.3*  HCT 26.6* 25.4*  PLT 291 294   BMET:  Recent Labs    09/26/19 0440 09/27/19 0500  NA 148* 153*  K 4.0 3.7  CL 104 105  CO2 38* 40*  GLUCOSE 205* 151*  BUN 116* 114*  CREATININE 1.05 1.06  CALCIUM 8.4* 8.6*    PT/INR: No results for input(s): LABPROT, INR in the last 72 hours. ABG    Component Value Date/Time   PHART 7.366 09/22/2019 2147   HCO3 39.3 (H) 09/22/2019 2147   TCO2 41 (H) 09/22/2019 2147   ACIDBASEDEF 1.0  08/08/2019 1115   O2SAT 68.5 09/27/2019 0505   CBG (last 3)  Recent Labs    09/27/19 0002 09/27/19 0459 09/27/19 0802  GLUCAP 137* 141* 132*    Assessment/Plan: S/P Procedure(s) (LRB): REMOVAL OF IMPELLA LEFT VENTRICULAR ASSIST DEVICE (N/A) TRANSESOPHAGEAL ECHOCARDIOGRAM (TEE) (N/A) Attempt trach trials daily Wound care for large gluteal decubitus Finish 10 days Levaquin for heavy secretions, gram - rod in sputum   LOS: 63 days    Todd [name] 09/27/2019

## 2019-09-27 NOTE — TOC Progression Note (Signed)
Transition of Care [facility]) - Progression Note    Patient Details  Name: Todd [name] MRN: [id number] Date of Birth: May 11, 1955  [facility]) CM/SW Contact  [name], RN Phone Number: 09/27/2019, 2:00 PM  Clinical Narrative:    Case Management spoke with Irving Burton with [facility] and asked that the patient be re-evaluated for possible admission again for [facility] [employer] authorization started in order to pursue placement for the patient locally in [place], [place] if possible.  As second alternative - I also spoke with Rosey Bath, RN with [facility] in [facility] and clinicals and assessment given to the LTAC regarding patient's respiratory history, Lines, drains, airway, vent settings, labs, clinicals and radiology reports - scanned to email [email or web address] as requested.  The patient has been denied admission to [facility] in the last 2 weeks - TOC is seeking possible admission to other [facility] for alternative transitions of care.  Will continue to follow.   Expected Discharge Plan: Long Term Acute Care (LTAC) Barriers to Discharge: Insurance Authorization  Expected Discharge Plan and Services Expected Discharge Plan: Long Term Acute Care (LTAC)   Discharge Planning Services: CM Consult Post Acute Care Choice: Long Term Acute Care (LTAC) Living arrangements for the past 2 months: Single Family Home                                       Social Determinants of Health (SDOH) Interventions    Readmission Risk Interventions Readmission Risk Prevention Plan 09/03/2019  Transportation Screening Complete  PCP or Specialist Appt within 3-5 Days Complete  HRI or Home Care Consult Complete  Social Work Consult for Recovery Care Planning/Counseling Complete  Palliative Care Screening Complete  Medication Review Oceanographer) Complete  Some recent data might be hidden

## 2019-09-28 LAB — GLUCOSE, CAPILLARY
Glucose-Capillary: 98 mg/dL (ref 70–99)
Glucose-Capillary: 147 mg/dL — ABNORMAL HIGH (ref 70–99)
Glucose-Capillary: 162 mg/dL — ABNORMAL HIGH (ref 70–99)
Glucose-Capillary: 55 mg/dL — ABNORMAL LOW (ref 70–99)
Glucose-Capillary: 93 mg/dL (ref 70–99)
Glucose-Capillary: 73 mg/dL (ref 70–99)
Glucose-Capillary: 91 mg/dL (ref 70–99)
Glucose-Capillary: 107 mg/dL — ABNORMAL HIGH (ref 70–99)

## 2019-09-28 LAB — COMPREHENSIVE METABOLIC PANEL
ALT: 76 U/L — ABNORMAL HIGH (ref 0–44)
AST: 85 U/L — ABNORMAL HIGH (ref 15–41)
Albumin: 1.8 g/dL — ABNORMAL LOW (ref 3.5–5.0)
Alkaline Phosphatase: 707 U/L — ABNORMAL HIGH (ref 38–126)
Anion gap: 7 (ref 5–15)
BUN: 103 mg/dL — ABNORMAL HIGH (ref 8–23)
CO2: 35 mmol/L — ABNORMAL HIGH (ref 22–32)
Calcium: 8.2 mg/dL — ABNORMAL LOW (ref 8.9–10.3)
Chloride: 108 mmol/L (ref 98–111)
Creatinine, Ser: 1.06 mg/dL (ref 0.61–1.24)
GFR calc Af Amer: >60 mL/min (ref >60)
GFR calc non Af Amer: >60 mL/min (ref >60)
Glucose, Bld: 123 mg/dL — ABNORMAL HIGH (ref 70–99)
Potassium: 4.3 mmol/L (ref 3.5–5.1)
Sodium: 150 mmol/L — ABNORMAL HIGH (ref 135–145)
Total Bilirubin: 5.1 mg/dL — ABNORMAL HIGH (ref 0.3–1.2)
Total Protein: 6.1 g/dL — ABNORMAL LOW (ref 6.5–8.1)

## 2019-09-28 LAB — CBC
HCT: 32.8 % — ABNORMAL LOW (ref 39.0–52.0)
HCT: 35.1 % — ABNORMAL LOW (ref 39.0–52.0)
Hemoglobin: 10.4 g/dL — ABNORMAL LOW (ref 13.0–17.0)
Hemoglobin: 9.7 g/dL — ABNORMAL LOW (ref 13.0–17.0)
MCH: 28.2 pg (ref 26.0–34.0)
MCH: 28.4 pg (ref 26.0–34.0)
MCHC: 29.6 g/dL — ABNORMAL LOW (ref 30.0–36.0)
MCHC: 29.6 g/dL — ABNORMAL LOW (ref 30.0–36.0)
MCV: 95.3 fL (ref 80.0–100.0)
MCV: 95.9 fL (ref 80.0–100.0)
Platelets: 242 10*3/uL (ref 150–400)
Platelets: 250 10*3/uL (ref 150–400)
RBC: 3.44 MIL/uL — ABNORMAL LOW (ref 4.22–5.81)
RBC: 3.66 MIL/uL — ABNORMAL LOW (ref 4.22–5.81)
RDW: 18.9 % — ABNORMAL HIGH (ref 11.5–15.5)
RDW: 19.5 % — ABNORMAL HIGH (ref 11.5–15.5)
WBC: 14.8 10*3/uL — ABNORMAL HIGH (ref 4.0–10.5)
WBC: 16.5 10*3/uL — ABNORMAL HIGH (ref 4.0–10.5)
nRBC: 0 % (ref 0.0–0.2)
nRBC: 0 % (ref 0.0–0.2)

## 2019-09-28 LAB — TYPE AND SCREEN
ABO/RH(D): A POS
Antibody Screen: NEGATIVE
Unit division: 0
Unit division: 0
Unit division: 0

## 2019-09-28 LAB — BPAM RBC
Blood Product Expiration Date: 202109102359
Blood Product Expiration Date: 202109102359
Blood Product Expiration Date: 202109102359
ISSUE DATE / TIME: 202108191757
ISSUE DATE / TIME: 202108192158
ISSUE DATE / TIME: 202108192158
Unit Type and Rh: 6200
Unit Type and Rh: 6200
Unit Type and Rh: 6200

## 2019-09-28 MED ORDER — DEXTROSE 50 % IV SOLN
12.5000 g | INTRAVENOUS | Status: AC
  Administered 2019-09-28: 12.5 g via INTRAVENOUS

## 2019-09-28 MED ORDER — FENTANYL 75 MCG/HR TD PT72
1.0000 | MEDICATED_PATCH | TRANSDERMAL | Status: DC
  Administered 2019-09-28 – 2019-10-07 (×4): 1 via TRANSDERMAL
  Filled 2019-09-28 (×4): qty 1

## 2019-09-28 NOTE — Progress Notes (Signed)
[address]       [place] [address]             [phone]                 42 Days Post-Op Procedure(s) (LRB): REMOVAL OF IMPELLA LEFT VENTRICULAR ASSIST DEVICE (N/A) TRANSESOPHAGEAL ECHOCARDIOGRAM (TEE) (N/A)   Events: No events overnight Only tolerated TC for today ___________________________________________ Vitals: BP (!) 101/51    Pulse 74    Temp 97.8 F (36.6 C) (Oral)    Resp 12    Ht 5\' 9"  (1.753 m)    Wt 74 kg    SpO2 100%    BMI 24.09 kg/m   - Neuro: arousable, following commands  - Cardiovascular: regular  Drips: none.      - Pulm: Trached.  EWOB Vent Mode: PRVC FiO2 (%):  [40 %] 40 % Set Rate:  [20 bmp] 20 bmp Vt Set:  [420 mL] 420 mL PEEP:  [0 cmH20-5 cmH20] 5 cmH20 Pressure Support:  [8 cmH20-14 cmH20] 8 cmH20 Plateau Pressure:  [21 cmH20-24 cmH20] 22 cmH20  ABG    Component Value Date/Time   PHART 7.366 09/22/2019 2147   PCO2ART 68.3 (HH) 09/22/2019 2147   PO2ART 76 (L) 09/22/2019 2147   HCO3 39.3 (H) 09/22/2019 2147   TCO2 41 (H) 09/22/2019 2147   ACIDBASEDEF 1.0 08/08/2019 1115   O2SAT 68.5 09/27/2019 0505    - Abd: soft - Extremity: warm  .Intake/Output      08/19 0701 - 08/20 0700 08/20 0701 - 08/21 0700   P.O.     I.V. (mL/kg)     Blood 850    Other 70    NG/GT 2040 1090   IV Piggyback 150 20.7   Total Intake(mL/kg) 3110 (42) 1110.7 (15)   Urine (mL/kg/hr) 2120 (1.2) 400 (0.6)   Drains 610 50   Stool 400    Total Output 3130 450   Net -20 +660.7           _______________________________________________________________ Labs: CBC Latest Ref Rng & Units 09/28/2019 09/28/2019 09/27/2019  WBC 4.0 - 10.5 K/uL 14.8(H) 16.5(H) 13.8(H)  Hemoglobin 13.0 - 17.0 g/dL 09/29/2019) 10.4(L) 6.8(LL)  Hematocrit 39 - 52 % 32.8(L) 35.1(L) 24.0(L)  Platelets 150 - 400 K/uL 242 250 221   CMP Latest Ref Rng & Units 09/28/2019 09/27/2019 09/26/2019  Glucose 70 - 99 mg/dL 09/28/2019) 782(N) 562(Z)  BUN 8 - 23 mg/dL 308(M)  578(I) 696(E)  Creatinine 0.61 - 1.24 mg/dL 952(W 4.13 2.44  Sodium 135 - 145 mmol/L 150(H) 153(H) 148(H)  Potassium 3.5 - 5.1 mmol/L 4.3 3.7 4.0  Chloride 98 - 111 mmol/L 108 105 104  CO2 22 - 32 mmol/L 35(H) 40(H) 38(H)  Calcium 8.9 - 10.3 mg/dL 8.2(L) 8.6(L) 8.4(L)  Total Protein 6.5 - 8.1 g/dL 6.1(L) 5.9(L) 6.0(L)  Total Bilirubin 0.3 - 1.2 mg/dL 5.1(H) 4.4(H) 4.9(H)  Alkaline Phos 38 - 126 U/L 707(H) 693(H) 719(H)  AST 15 - 41 U/L 85(H) 84(H) 85(H)  ALT 0 - 44 U/L 76(H) 79(H) 86(H)    CXR: pending  _______________________________________________________________  Assessment and Plan:  s/p CABG on 6/24, VA ecmo, impella  Neuro: pain controlled.  mentating well CV: Good hemodynamics Pulm: continue pulmonary toilet.  Mucus plug cleared.  vent wean as tolerated.  Renal: creat stable .  Good uop GI: on tube feeds.   Heme: stable.  plts recovered.   ID: on levaquin for  pneumonia.   Endo: SSI Dispo: continue ICU care.  Brynda Greathouse, MD 09/28/2019 4:29 PM

## 2019-09-28 NOTE — Evaluation (Signed)
[facility] Speaking Valve - Evaluation Patient Details  Name: Todd [name] MRN: [id number] Date of Birth: 04-24-1955  Today's Date: 09/28/2019 Time: 1010-1046 SLP Time Calculation (min) (ACUTE ONLY): 36 min  Past Medical History:  Past Medical History:  Diagnosis Date  . Arthritis   . CHF (congestive heart failure) (HCC)   . Depression   . Diabetes mellitus without complication (HCC)   . Hard of hearing   . Hyperlipidemia   . Hypertension    Past Surgical History:  Past Surgical History:  Procedure Laterality Date  . APPENDECTOMY  1995  . APPLICATION OF WOUND VAC N/A 08/10/2019   Procedure: APPLICATION OF WOUND VAC;  Surgeon: [name], MD;  [facility]: [facility] OR;  Service: Thoracic;  Laterality: N/A;  Prevena dressing to wound vac  . CANNULATION FOR ECMO (EXTRACORPOREAL MEMBRANE OXYGENATION) N/A 08/03/2019   Procedure: CANNULATION FOR ECMO (EXTRACORPOREAL MEMBRANE OXYGENATION);  Surgeon: [name], MD;  Location[facility] OR;  Service: Open Heart Surgery;  Laterality: N/A;  . CANNULATION FOR ECMO (EXTRACORPOREAL MEMBRANE OXYGENATION) N/A 08/06/2019   Procedure: DECANNULATION FOR ECMO (EXTRACORPOREAL MEMBRANE OXYGENATION);  Surgeon: [name], MD;  Location: [facility] OR;  Service: Open Heart Surgery;  Laterality: N/A;  . CAROTID ENDARTERECTOMY  2010  . CORONARY ARTERY BYPASS GRAFT N/A 08/02/2019   Procedure: CORONARY ARTERY BYPASS GRAFTING (CABG), ON PUMP, TIMES FOUR, USING LEFT INTERNAL MAMMARY, LEFT RADIAL ARTERY, AND ENDOSCOPICALLY HARVESTED RIGHT GREATER SAPHENOUS VEIN;  Surgeon: [name], MD;  Location: [facility] OR;  Service: Open Heart Surgery;  Laterality: N/A;  . EXPLORATION POST OPERATIVE OPEN HEART N/A 08/07/2019   Procedure: MEDIASTINAL EXPLORATION POST OPERATIVE OPEN HEART IN 2H02;  Surgeon: [name], MD;  Location: [facility] OR;  Service: Open Heart Surgery;  Laterality: N/A;  Bedside procedure 2H02/emergency  . IR GASTROSTOMY TUBE MOD SED  09/04/2019  .  PLACEMENT OF IMPELLA LEFT VENTRICULAR ASSIST DEVICE Right 08/06/2019   Procedure: PLACEMENT OF IMPELLA LEFT VENTRICULAR ASSIST DEVICE;  Surgeon: [name], MD;  Location: [facility] OR;  Service: Open Heart Surgery;  Laterality: Right;  . RADIAL ARTERY HARVEST Left 08/02/2019   Procedure: LEFT RADIAL ARTERY HARVEST;  Surgeon: [name], MD;  Location: [facility] OR;  Service: Open Heart Surgery;  Laterality: Left;  . REMOVAL OF IMPELLA LEFT VENTRICULAR ASSIST DEVICE N/A 08/17/2019   Procedure: REMOVAL OF IMPELLA LEFT VENTRICULAR ASSIST DEVICE;  Surgeon: [name], MD;  Location: [facility] OR;  Service: Open Heart Surgery;  Laterality: N/A;  . RIGHT/LEFT HEART CATH AND CORONARY ANGIOGRAPHY N/A 07/30/2019   Procedure: RIGHT/LEFT HEART CATH AND CORONARY ANGIOGRAPHY;  Surgeon: Kathleene Hazel, MD;  [facility]: [facility] INVAS[facility] CV LAB;  Service: Cardiovascular;  Laterality: N/A;  . STERNAL CLOSURE N/A 08/10/2019   Procedure: STERNAL CLOSURE;  Surgeon: [name], MD;  Location: [facility] OR;  Service: Thoracic;  Laterality: N/A;  . TEE WITHOUT CARDIOVERSION N/A 08/02/2019   Procedure: TRANSESOPHAGEAL ECHOCARDIOGRAM (TEE);  Surgeon: [name], MD;  Location: [facility] OR;  Service: Open Heart Surgery;  Laterality: N/A;  . TEE WITHOUT CARDIOVERSION N/A 08/06/2019   Procedure: TRANSESOPHAGEAL ECHOCARDIOGRAM (TEE);  Surgeon: [name], MD;  Location: [facility] OR;  Service: Open Heart Surgery;  Laterality: N/A;  . TEE WITHOUT CARDIOVERSION N/A 08/10/2019   Procedure: TRANSESOPHAGEAL ECHOCARDIOGRAM (TEE);  Surgeon: [name], MD;  Location: [facility] OR;  Service: Thoracic;  Laterality: N/A;  . TEE WITHOUT CARDIOVERSION N/A 08/17/2019   Procedure: TRANSESOPHAGEAL ECHOCARDIOGRAM (TEE);  Surgeon:  [name], MD;  Location: [facility] OR;  Service: Open Heart Surgery;  Laterality: N/A;  . WOUND DEBRIDEMENT N/A 08/03/2019   Procedure: DEBRIDEMENT WOUND;  Surgeon: [name], MD;  Location: [facility]  OR;  Service: Vascular;  Laterality: N/A;   HPI:  Pt admitted on 6/17 with CHF exacerbation, found to have multi-vessel disease requiring CABG 6/24, post op VT Arrest 6/25 and cannulated for VA ECMO. S/p decannulation and impella. Pt has had a prolonged hospitalization with complications including intubation from 6/24 from CABG, Until 7/12 when trach placed, sepsis from fungal infection, tracehobronchitis, PEA arrest on 8/14 but f/c next day, awake and alert.    Assessment / Plan / Recommendation Clinical Impression  Pt demonstrates excellent success with inline PMSV with RT Todd [name]. Todd Simmonds, NP also at bedside. Pt switched to pressure support ventilation with a PS setting of 14. Cuff delfated with immediate air leak and PMSV placed with resumption of adequate volumes, PEEP dropped to 0. Pt tolerated placement with no change in vital signs and no report of discomfort. Breath support for adequate vocal quality was excellent. Pt responded to questions with single words and short phrases. He followed one step commands. He occasionally needed cue to verbalize rather than use gestures. He was orientated to self, place and situation, but also verbalized elements of delirium. His questions included "Will I be able to talk" and "What about my baby?" Questioning confirmed that pt has been thinking about a baby, that he thought he was in his 30s. He reports he and his wife struggled with miscarriages in the past, so perhaps that confusion is related to some prior experiences. Pt also requested water and NP cleared SLP to start working on swallowing gradually (see next note). Given that pt was easily able to use PMSV without any assistance needed to coordiante breathing and phonation and pt will benefit from frequent time on inline PMSV, Pt can wear with RT supervision only. SLP will reassess pt with PMSV on ATC as breath support off vent may be dramatically different. Recommend LTACH at d/c.   SLP Visit Diagnosis:  Dysphagia, oropharyngeal phase (R13.12)    SLP Assessment  Patient needs continued Speech Lanaguage Pathology Services    Follow Up Recommendations       Frequency and Duration min 2x/week  2 weeks    PMSV Trial PMSV was placed for: 10 mintues Able to redirect subglottic air through upper airway: Yes Able to Attain Phonation: Yes Voice Quality: Normal Able to Expectorate Secretions: No attempts Breath Support for Phonation: Adequate Intelligibility: Intelligible SpO2 During Trial: 97 %   Tracheostomy Tube       Vent Dependency  Vent Dependent: Yes Vent Mode: PSV PEEP: 0 cmH20 Pressure Support: 14 cmH20 FiO2 (%): 40 %    Cuff Deflation Trial  GO Tolerated Cuff Deflation: Yes Length of Time for Cuff Deflation Trial: 10 minutes Behavior: Alert;Controlled;Cooperative        Todd [name], Riley Nearing 09/28/2019, 11:13 AM

## 2019-09-28 NOTE — Evaluation (Signed)
Clinical/Bedside Swallow Evaluation Patient Details  Name: Todd [name] MRN: [id number] Date of Birth: 05/24/1955  Today's Date: 09/28/2019 Time: SLP Start Time (ACUTE ONLY): 1010 SLP Stop Time (ACUTE ONLY): 1046 SLP Time Calculation (min) (ACUTE ONLY): 36 min  Past Medical History:  Past Medical History:  Diagnosis Date  . Arthritis   . CHF (congestive heart failure) (HCC)   . Depression   . Diabetes mellitus without complication (HCC)   . Hard of hearing   . Hyperlipidemia   . Hypertension    Past Surgical History:  Past Surgical History:  Procedure Laterality Date  . APPENDECTOMY  1995  . APPLICATION OF WOUND VAC N/A 08/10/2019   Procedure: APPLICATION OF WOUND VAC;  Surgeon: [name], MD;  Location: [facility] OR;  Service: Thoracic;  Laterality: N/A;  Prevena dressing to wound vac  . CANNULATION FOR ECMO (EXTRACORPOREAL MEMBRANE OXYGENATION) N/A 08/03/2019   Procedure: CANNULATION FOR ECMO (EXTRACORPOREAL MEMBRANE OXYGENATION);  Surgeon: [name], MD;  Location: [facility] OR;  Service: Open Heart Surgery;  Laterality: N/A;  . CANNULATION FOR ECMO (EXTRACORPOREAL MEMBRANE OXYGENATION) N/A 08/06/2019   Procedure: DECANNULATION FOR ECMO (EXTRACORPOREAL MEMBRANE OXYGENATION);  Surgeon: [name], MD;  Location: [facility] OR;  Service: Open Heart Surgery;  Laterality: N/A;  . CAROTID ENDARTERECTOMY  2010  . CORONARY ARTERY BYPASS GRAFT N/A 08/02/2019   Procedure: CORONARY ARTERY BYPASS GRAFTING (CABG), ON PUMP, TIMES FOUR, USING LEFT INTERNAL MAMMARY, LEFT RADIAL ARTERY, AND ENDOSCOPICALLY HARVESTED RIGHT GREATER SAPHENOUS VEIN;  Surgeon: [name], MD;  Location: [facility] OR;  Service: Open Heart Surgery;  Laterality: N/A;  . EXPLORATION POST OPERATIVE OPEN HEART N/A 08/07/2019   Procedure: MEDIASTINAL EXPLORATION POST OPERATIVE OPEN HEART IN 2H02;  Surgeon: [name], MD;  Location: [facility] OR;  Service: Open Heart Surgery;  Laterality: N/A;  Bedside procedure  2H02/emergency  . IR GASTROSTOMY TUBE MOD SED  09/04/2019  . PLACEMENT OF IMPELLA LEFT VENTRICULAR ASSIST DEVICE Right 08/06/2019   Procedure: PLACEMENT OF IMPELLA LEFT VENTRICULAR ASSIST DEVICE;  Surgeon: [name], MD;  Location: [facility] OR;  Service: Open Heart Surgery;  Laterality: Right;  . RADIAL ARTERY HARVEST Left 08/02/2019   Procedure: LEFT RADIAL ARTERY HARVEST;  Surgeon: [name], MD;  Location: [facility] OR;  Service: Open Heart Surgery;  Laterality: Left;  . REMOVAL OF IMPELLA LEFT VENTRICULAR ASSIST DEVICE N/A 08/17/2019   Procedure: REMOVAL OF IMPELLA LEFT VENTRICULAR ASSIST DEVICE;  Surgeon: [name], MD;  Location: [facility] OR;  Service: Open Heart Surgery;  Laterality: N/A;  . RIGHT/LEFT HEART CATH AND CORONARY ANGIOGRAPHY N/A 07/30/2019   Procedure: RIGHT/LEFT HEART CATH AND CORONARY ANGIOGRAPHY;  Surgeon: Kathleene Hazel, MD;  Location: [facility] INVAS[facility] CV LAB;  Service: Cardiovascular;  Laterality: N/A;  . STERNAL CLOSURE N/A 08/10/2019   Procedure: STERNAL CLOSURE;  Surgeon: [name], MD;  Location: [facility] OR;  Service: Thoracic;  Laterality: N/A;  . TEE WITHOUT CARDIOVERSION N/A 08/02/2019   Procedure: TRANSESOPHAGEAL ECHOCARDIOGRAM (TEE);  Surgeon: [name], MD;  Location: [facility] OR;  Service: Open Heart Surgery;  Laterality: N/A;  . TEE WITHOUT CARDIOVERSION N/A 08/06/2019   Procedure: TRANSESOPHAGEAL ECHOCARDIOGRAM (TEE);  Surgeon: [name], MD;  Location: [facility] OR;  Service: Open Heart Surgery;  Laterality: N/A;  . TEE WITHOUT CARDIOVERSION N/A 08/10/2019   Procedure: TRANSESOPHAGEAL ECHOCARDIOGRAM (TEE);  Surgeon: [name], MD;  Location: [facility] OR;  Service: Thoracic;  Laterality: N/A;  . TEE WITHOUT CARDIOVERSION N/A  08/17/2019   Procedure: TRANSESOPHAGEAL ECHOCARDIOGRAM (TEE);  Surgeon: [name], MD;  Location: [facility] OR;  Service: Open Heart Surgery;  Laterality: N/A;  . WOUND DEBRIDEMENT N/A 08/03/2019   Procedure:  DEBRIDEMENT WOUND;  Surgeon: [name], MD;  Location: [facility] OR;  Service: Vascular;  Laterality: N/A;   HPI:  Pt admitted on 6/17 with CHF exacerbation, found to have multi-vessel disease requiring CABG 6/24, post op VT Arrest 6/25 and cannulated for VA ECMO. S/p decannulation and impella. Pt has had a prolonged hospitalization with complications including intubation from 6/24 from CABG, Until 7/12 when trach placed, sepsis from fungal infection, tracehobronchitis, PEA arrest on 8/14 but f/c next day, awake and alert.    Assessment / Plan / Recommendation Clinical Impression  Pt demosntrates promising function for future oral intake. Assessment conducted while pt on Inline PMSV trials with PS ventilation. Pt verbalized a desir for water. Oral motor exam showed no focal weakness and vocal quality with venlitated breath was adequate. Pt had a slightly prolonged but thorough oral manipulation of ice. Swallow effort appeared weak, pt grimaced as if it were difficult to initaite. Laryngeal elevation subjectively seemed decreased. Pt consume about 5 ice chips with no signs of aspiration. Will proceed with FEES next week if medically stable.  SLP Visit Diagnosis: Dysphagia, oropharyngeal phase (R13.12)    Aspiration Risk  Moderate aspiration risk    Diet Recommendation NPO        Other  Recommendations Oral Care Recommendations: Oral care QID   Follow up Recommendations LTACH      Frequency and Duration min 2x/week          Prognosis Prognosis for Safe Diet Advancement: Good      Swallow Study   General HPI: Pt admitted on 6/17 with CHF exacerbation, found to have multi-vessel disease requiring CABG 6/24, post op VT Arrest 6/25 and cannulated for VA ECMO. S/p decannulation and impella. Pt has had a prolonged hospitalization with complications including intubation from 6/24 from CABG, Until 7/12 when trach placed, sepsis from fungal infection, tracehobronchitis, PEA arrest on 8/14  but f/c next day, awake and alert.  Type of Study: Bedside Swallow Evaluation Previous Swallow Assessment: none Diet Prior to this Study: NPO;PEG tube Temperature Spikes Noted: Yes Respiratory Status: Trach;Ventilator Trach Size and Type: Other (Comment) (7.5 cuffed) History of Recent Intubation: No Behavior/Cognition: Alert;Cooperative;Pleasant mood Oral Cavity Assessment: Dry Oral Care Completed by SLP: No Oral Cavity - Dentition: Poor condition Vision: Functional for self-feeding Self-Feeding Abilities: Total assist Patient Positioning: Upright in bed Baseline Vocal Quality: Normal (with pMSV in place on vent) Volitional Cough: Strong    Oral/Motor/Sensory Function Overall Oral Motor/Sensory Function: Within functional limits   Ice Chips Ice chips: Impaired Pharyngeal Phase Impairments: Decreased hyoid-laryngeal movement   Thin Liquid Thin Liquid: Not tested    Nectar Thick Nectar Thick Liquid: Not tested   Honey Thick Honey Thick Liquid: Not tested   Puree Puree: Not tested   Solid     Solid: Not tested      [name] 09/28/2019,11:26 AM

## 2019-09-28 NOTE — Progress Notes (Signed)
Physical Therapy Treatment Patient Details Name: Todd [name] MRN: [id number] DOB: November 06, 1955 Today's Date: 09/28/2019    History of Present Illness Todd [name] is a 64 y.o. male presenting with shortness of breath; noted AKI, CHF, recent PNA, elevated troponin, pulm edema/effusion, and cardiomegaly.Marland Kitchen CABG 6/24 and intubated (extubated 6/25)--went into cardiac arrest on same date with re-opening of chest for heart massage and direct epicardial paddles and place on ECMO. 08/03/19 washout out for tamponade. 7/2 sternal closure and wound vac. 7/9 Impella removed.08/20/19 Tracheostomy 08/24/19 cholecystostomy tube. 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 PMHx: HFrEF, COPD, HTN, T2DM, HLD, MDD, chronic back pain, vitamin deficiency, BPH, tobacco use disorder, marijuana use disorder    PT Comments    Pt tolerated slight increase in Tilt bed to 50 degrees today; however, had difficulty with mini squats and quad sets at this angle.  His VSS throughout therapy. Pt did fatigue easily (just received hydro therapy).  Pt remains profoundly weak and will continue to benefit from PT. Of note middle Kreg strap was positioned over upper thigh - would benefit from positioning closer to knee - notified RN of this if they tilt pt over weekend.    Follow Up Recommendations  LTACH;SNF     Equipment Recommendations  Other (comment) (defer to next venue)    Recommendations for Other Services       Precautions / Restrictions Precautions Precautions: Fall;Sternal Precaution Comments: trach, chole drain, sacral wound, PEG Other Brace: Bil Prevalon Restrictions Other Position/Activity Restrictions: sternal precautions     Mobility  Bed Mobility Overal bed mobility: Needs Assistance Bed Mobility: Rolling Rolling: Max assist;+2 for physical assistance         General bed mobility comments: Moved pt up in bed with total assist of 2, max assist +2 to roll for  repositioning  Transfers Overall transfer level: Needs assistance Equipment used:  (KREG bed)             General transfer comment: Tilted bed in increments with stable vital signs.  Tilted up to 50 degrees for 5 mins and back to 40 degrees for 5 mins. Had middle strap positioned on upper thigh - would benefit from lower positioning on low thigh  Ambulation/Gait                 Stairs             Wheelchair Mobility    Modified Rankin (Stroke Patients Only)       Balance       Sitting balance - Comments: deferred, used KREG bed today                                    Cognition Arousal/Alertness: Awake/alert Behavior During Therapy: Flat affect Overall Cognitive Status: Difficult to assess                     Current Attention Level: Sustained   Following Commands: Follows one step commands with increased time     Problem Solving: Slow processing;Decreased initiation;Difficulty sequencing;Requires verbal cues;Requires tactile cues General Comments: Pt using head nods to communicate      Exercises Other Exercises Other Exercises: In 50 degree tilt performed quad sets x 5.  Unable to tolerate knee flexion 50 degrees.  Decreased to 40 degrees and performed knee bends/mini-squats x 5 with knees blocked and assist to straighten, performed  5 more quad sets.  Also , performed UE reaching with assist.    General Comments        Pertinent Vitals/Pain Pain Assessment: Faces Faces Pain Scale: Hurts even more Pain Location: Sacrum and knees Pain Descriptors / Indicators: Grimacing;Guarding Pain Intervention(s): Limited activity within patient's tolerance;Monitored during session;Repositioned    Home Living                      Prior Function            PT Goals (current goals can now be found in the care plan section) Acute Rehab PT Goals Patient Stated Goal: pt unable to state PT Goal Formulation: With  patient/family Time For Goal Achievement: 10/05/19 Potential to Achieve Goals: Fair Progress towards PT goals: Progressing toward goals    Frequency    Min 2X/week      PT Plan Current plan remains appropriate    Co-evaluation              AM-PAC PT "6 Clicks" Mobility   Outcome Measure  Help needed turning from your back to your side while in a flat bed without using bedrails?: Total Help needed moving from lying on your back to sitting on the side of a flat bed without using bedrails?: Total Help needed moving to and from a bed to a chair (including a wheelchair)?: Total Help needed standing up from a chair using your arms (e.g., wheelchair or bedside chair)?: Total Help needed to walk in hospital room?: Total Help needed climbing 3-5 steps with a railing? : Total 6 Click Score: 6    End of Session Equipment Utilized During Treatment: Other (comment);Oxygen (trach with vent) Activity Tolerance: Patient limited by pain;Patient limited by fatigue Patient left: in bed;with call bell/phone within reach;with nursing/sitter in room (nursing into bath pt) Nurse Communication: Mobility status;Need for lift equipment PT Visit Diagnosis: Unsteadiness on feet (R26.81);Muscle weakness (generalized) (M62.81);Difficulty in walking, not elsewhere classified (R26.2)     Time: 6789-3810 PT Time Calculation (min) (ACUTE ONLY): 29 min  Charges:  $Therapeutic Exercise: 8-22 mins $Therapeutic Activity: 8-22 mins                     Anise Salvo, PT Acute Rehab Services Pager [phone] [facility] [phone]     Rayetta Humphrey 09/28/2019, 3:21 PM

## 2019-09-28 NOTE — Plan of Care (Signed)
  Problem: Education: Goal: Knowledge of General Education information will improve Description: Including pain rating scale, medication(s)/side effects and non-pharmacologic comfort measures Outcome: Not Progressing   Problem: Clinical Measurements: Goal: Ability to maintain clinical measurements within normal limits will improve Outcome: Not Progressing   Problem: Clinical Measurements: Goal: Diagnostic test results will improve Outcome: Not Progressing   Problem: Clinical Measurements: Goal: Respiratory complications will improve Outcome: Not Progressing   Problem: Clinical Measurements: Goal: Cardiovascular complication will be avoided Outcome: Not Progressing   Problem: Activity: Goal: Risk for activity intolerance will decrease Outcome: Not Progressing

## 2019-09-28 NOTE — Progress Notes (Signed)
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 has evolved into stage 4 when assessed on 8/9 (Active)  Dressing Type ABD;Barrier Film (skin prep);Gauze (Comment);Moist to moist 09/28/19 1503  Dressing Changed;Clean;Dry;Intact 09/28/19 1503  Dressing Change Frequency Twice a day 09/28/19 1503  State of Healing Non-healing 09/28/19 1503  Site / Wound Assessment Bleeding;Yellow;Painful 09/28/19 1503  % Wound base Red or Granulating 10% 09/28/19 1503  % Wound base Yellow/Fibrinous Exudate 40% 09/28/19 1503  % Wound base Black/Eschar 45% 09/28/19 1503  % Wound base Other/Granulation Tissue (Comment) 5% 09/28/19 1503  Peri-wound Assessment Erythema (blanchable) 09/28/19 1503  Wound Length (cm) 6.4 cm 09/26/19 1500  Wound Width (cm) 5.2 cm 09/26/19 1500  Wound Depth (cm) 2.3 cm 09/26/19 1500  Wound Surface Area (cm^2) 33.28 cm^2 09/26/19 1500  Wound Volume (cm^3) 76.54 cm^3 09/26/19 1500  Tunneling (cm) 7 cm at 5 o'clock 09/26/19 1500  Undermining (cm) 1.2-2.2 cm around entire wound 09/26/19 1500  Margins Unattached edges (unapproximated) 09/28/19 1503  Drainage Amount Moderate 09/28/19 1503  Drainage Description Sanguineous 09/28/19 1503  Treatment Debridement (Selective);Hydrotherapy (Pulse lavage);Packing (Saline gauze) 09/28/19 1503      Hydrotherapy Pulsed lavage therapy - wound location: sacrum Pulsed Lavage with Suction (psi): 12 psi Pulsed Lavage with Suction - Normal Saline Used: 1000 mL Pulsed Lavage Tip: Tip with splash  shield Selective Debridement Selective Debridement - Location: sacrum Selective Debridement - Tools Used: Forceps;Scissors Selective Debridement - Tissue Removed: yellow/gray slough   Wound Assessment and Plan  Wound Therapy - Assess/Plan/Recommendations Wound Therapy - Clinical Statement: Wound is likely going to continue to increase in size. Increased sanguineous drainage with sharp debridement, requiring use of silver nitrate sticks. Pt will continue to benefit from removal of necrotic tissue to promote wound healing.  Wound Therapy - Functional Problem List: decreased mobility Factors Delaying/Impairing Wound Healing: Diabetes Mellitus;Incontinence;Immobility;Multiple medical problems Hydrotherapy Plan: Debridement;Dressing change;Patient/family education;Pulsatile lavage with suction Wound Therapy - Frequency: 3X / week Wound Therapy - Follow Up Recommendations: Other (comment) (LTACH) Wound Plan: see above  Wound Therapy Goals- Improve the function of patient's integumentary system by progressing the wound(s) through the phases of wound healing (inflammation - proliferation - remodeling) by: Decrease Necrotic Tissue to: 60 Decrease Necrotic Tissue - Progress: Progressing toward goal Increase Granulation Tissue to: 40 Increase Granulation Tissue - Progress: Progressing toward goal Goals/treatment plan/discharge plan were made with and agreed upon by patient/family: No, Patient unable to participate in goals/treatment/discharge plan and family unavailable Time For Goal Achievement: 7 days Wound Therapy - Potential for Goals: Poor  Goals will be updated until maximal potential achieved or discharge criteria met.  Discharge criteria: when goals achieved, discharge from hospital, MD decision/surgical intervention, no progress towards goals, refusal/missing three consecutive treatments without notification or medical reason.  GP      Wyona Almas, PT, DPT Acute Rehabilitation  Services Pager [phone] Office [phone]   Deno Etienne 09/28/2019, 3:07 PM

## 2019-09-28 NOTE — Progress Notes (Signed)
Patient ID: Todd [name], male   DOB: April 10, 1955, 64 y.o.   MRN: [id number] TCTS DAILY ICU PROGRESS NOTE                   [address]            [place] [address]          [phone]   42 Days Post-Op Procedure(s) (LRB): REMOVAL OF IMPELLA LEFT VENTRICULAR ASSIST DEVICE (N/A) TRANSESOPHAGEAL ECHOCARDIOGRAM (TEE) (N/A)   57 days postop  DATE OF PROCEDURE: 08/02/2019 PREOPERATIVE DIAGNOSIS: Severe 3-vessel coronary disease with ischemic cardiomyopathy. POSTOPERATIVE DIAGNOSIS: Severe 3-vessel coronary disease with ischemic cardiomyopathy. PROCEDURE:  Median sternotomy, extracorporeal circulation, Coronary artery bypass grafting x 4 Left internal mammary artery to left anterior descending, Saphenous vein graft to 1st diagonal, Saphenous vein graft to posterior descending, Left radial to obtuse marginal 1 Endoscopic vein harvestright thigh   Total Length of Stay:  LOS: 64 days   Subjective: Uncomfortable this morning, on ventilator, did not tolerate trach collar well yesterday.  Received 3 units of blood per CCM during the night, small amount of bloody tracheal secretions but does not explain that level of blood loss. Stools are "pure tube feedings" no blood noted  Objective: Vital signs in last 24 hours: Temp:  [97.4 F (36.3 C)-98.4 F (36.9 C)] 97.4 F (36.3 C) (08/19 2352) Pulse Rate:  [59-84] 62 (08/20 0734) Cardiac Rhythm: Normal sinus rhythm (08/19 2000) Resp:  [13-29] 13 (08/20 0734) BP: (99-141)/(50-72) 105/52 (08/20 0600) SpO2:  [95 %-100 %] 96 % (08/20 0734) FiO2 (%):  [40 %] 40 % (08/20 0734) Weight:  [74 kg] 74 kg (08/20 0500)  Filed Weights   09/26/19 0600 09/27/19 0400 09/28/19 0500  Weight: 72.5 kg 71.2 kg 74 kg    Weight change: 2.8 kg   Hemodynamic parameters for last 24 hours:    Intake/Output from previous day: 08/19 0701 - 08/20 0700 In: 3040 [Blood:850; NG/GT:1970; IV Piggyback:150] Out:  8938 [Urine:2120; Drains:610; Stool:400]  Intake/Output this shift: No intake/output data recorded.  Current Meds: Scheduled Meds: . sodium chloride   Intravenous Once  . sodium chloride   Intravenous Once  . amiodarone  200 mg Per Tube Daily  . aspirin  81 mg Per Tube Daily  . chlorhexidine gluconate (MEDLINE KIT)  15 mL Mouth Rinse BID  . Chlorhexidine Gluconate Cloth  6 each Topical Daily  . clonazePAM  1 mg Per Tube BID  . collagenase   Topical BID  . FLUoxetine  20 mg Per Tube Daily  . fondaparinux (ARIXTRA) injection  2.5 mg Subcutaneous Q0600  . free water  300 mL Per Tube Q3H  . insulin aspart  0-6 Units Subcutaneous Q4H  . insulin aspart  2 Units Subcutaneous Q4H  . insulin detemir  14 Units Subcutaneous BID  . ipratropium-albuterol  3 mL Nebulization TID  . lidocaine  1 patch Transdermal Q24H  . mouth rinse  15 mL Mouth Rinse 10 times per day  . midodrine  5 mg Per Tube TID WC  . nutrition supplement (JUVEN)  1 packet Per Tube BID BM  . pantoprazole (PROTONIX) IV  40 mg Intravenous Q12H  . QUEtiapine  50 mg Oral QHS  . sodium chloride flush  10-40 mL Intracatheter Q12H  . sodium chloride flush  3 mL Intravenous Q12H  . sodium chloride flush  5 mL Intracatheter Q8H  . sodium chloride HYPERTONIC  4 mL Nebulization BID  . testosterone  5 g Transdermal Daily  . thiamine  100 mg Per Tube Daily   Continuous Infusions: . feeding supplement (PIVOT 1.5 CAL) 1,000 mL (09/26/19 2342)  . levofloxacin (LEVAQUIN) IV Stopped (09/27/19 1739)   PRN Meds:.acetaminophen (TYLENOL) oral liquid 160 mg/5 mL, dextrose, fentaNYL (SUBLIMAZE) injection, [place] Boulay's butt cream, lip balm, metoprolol tartrate, ondansetron (ZOFRAN) IV, oxyCODONE, silver nitrate applicators, sodium chloride flush, traZODone  General appearance: cooperative and fatigued Neurologic: intact Heart: regular rate and rhythm, S1, S2 normal, no murmur, click, rub or gallop Abdomen: Abdomen not distended, not obvious  source of blood loss Wound: Dressings large sacral decubitus being treated per general surgery and plastics  Lab Results: CBC: Recent Labs    09/28/19 0033 09/28/19 0438  WBC 16.5* 14.8*  HGB 10.4* 9.7*  HCT 35.1* 32.8*  PLT 250 242   BMET:  Recent Labs    09/27/19 0500 09/28/19 0438  NA 153* 150*  K 3.7 4.3  CL 105 108  CO2 40* 35*  GLUCOSE 151* 123*  BUN 114* 103*  CREATININE 1.06 1.06  CALCIUM 8.6* 8.2*    CMET: Lab Results  Component Value Date   WBC 14.8 (H) 09/28/2019   HGB 9.7 (L) 09/28/2019   HCT 32.8 (L) 09/28/2019   PLT 242 09/28/2019   GLUCOSE 123 (H) 09/28/2019   CHOL 203 (H) 07/27/2019   TRIG 213 (H) 08/27/2019   HDL 34 (L) 07/27/2019   LDLCALC 148 (H) 07/27/2019   ALT 76 (H) 09/28/2019   AST 85 (H) 09/28/2019   NA 150 (H) 09/28/2019   K 4.3 09/28/2019   CL 108 09/28/2019   CREATININE 1.06 09/28/2019   BUN 103 (H) 09/28/2019   CO2 35 (H) 09/28/2019   TSH 2.243 07/31/2019   INR 1.3 (H) 09/27/2019   HGBA1C 5.3 09/25/2019      PT/INR:  Recent Labs    09/27/19 2049  LABPROT 15.3*  INR 1.3*   Radiology: DG Chest Port 1 View  Result Date: 09/27/2019 CLINICAL DATA:  Acute respiratory failure EXAM: PORTABLE CHEST 1 VIEW COMPARISON:  09/25/2019 FINDINGS: Single frontal view of the chest demonstrates stable tracheostomy tube and right-sided PICC. Cardiac silhouette is enlarged but stable. Diffuse bilateral ground-glass airspace disease unchanged. Small bilateral pleural effusions are again noted, stable. No evidence of pneumothorax. IMPRESSION: 1. Stable widespread airspace disease and small bilateral pleural effusions. Electronically Signed   By: Randa Ngo M.D.   On: 09/27/2019 22:21     Assessment/Plan: 42 Days Post-Op Procedure(s) (LRB): REMOVAL OF IMPELLA LEFT VENTRICULAR ASSIST DEVICE (N/A) TRANSESOPHAGEAL ECHOCARDIOGRAM (TEE) (N/A)   57 days postop  DATE OF PROCEDURE: 08/02/2019 PREOPERATIVE DIAGNOSIS: Severe 3-vessel  coronary disease with ischemic cardiomyopathy. POSTOPERATIVE DIAGNOSIS: Severe 3-vessel coronary disease with ischemic cardiomyopathy. PROCEDURE:  Median sternotomy, extracorporeal circulation, Coronary artery bypass grafting x 4 Left internal mammary artery to left anterior descending, Saphenous vein graft to 1st diagonal, Saphenous vein graft to posterior descending, Left radial to obtuse marginal 1 Endoscopic vein harvestright thigh  Continue weaning attempts per CCM Continued wound care large gluteal decubitus Continue to monitor for source of blood loss other than chronic ICU care and associated blood loss   Grace Isaac 09/28/2019 7:41 AM

## 2019-09-28 NOTE — Progress Notes (Signed)
NAME:  Todd [name], MRN:  [id number], DOB:  08-Jan-1956, LOS: 27 ADMISSION DATE:  07/26/2019, CONSULTATION DATE:  08/03/19 REFERRING MD:  Todd [name], CHIEF COMPLAINT:  ECMO   Brief History   64 yo male admitted with CHF exacerbation, found to have multi-vessel disease requiring CABG 6/24, post op VT Arrest 6/25 and cannulated for VA ECMO. S/p decannulation and impella.  History of present illness   Presented with worsening dyspnea 6/17 c/w CHF exacerbation.  LHC  with severe triple vessel disease.  Underwent CABG 6/24. Vtach arrest 6/25.  Chest opened bedside and cardiac massage initiated as well as multiple cardioversions, amiodarone, bicarb etc.  Brought to OR and cannulated for VA ECMO.  PCCM consulted to assist with management  Comorbidities include DM, heavy smoking, COPD  Past Medical History  Depression, Ischemic cardiomyopathy, HTN, [facility] Events   6/24 CABG 6/25 VA cannulation 6/28 decannulated and impella placed 6/29 bedside re-exploration; s/p decannulation. Placement of impella device originally at p8 but decreased to p4 2/2 suction events overnight up to p6. Echo completed and repositioned device. Remained on considerable support with 8 epi and 46 norepi vaso 0.05. continued chest tube output with large clots noted. Heparin thru device. Replacement products ongoing. 60% 8 peep 6/30 bedside mediastinal re-exploration and clean out of hematoma with tamponade and worsening hemodynamics. Pt had large volume transfusion. rebolused with amio 2/2 nsvt episode.  Weight up 48 pounds.  Started diuresis, Lasix drip started. 7/01 iatrogenic respiratory alkalosis, vent rate decreased. 7/02 No significant issues overnight remains on pressors and Impella not tolerating tube feeds with high gastric output awaiting core track placement 7/03 started trickle TF  7/05 Swab removed, CVL and PICC placed. 7/13 Trach and BAL 7/16 Awake and tracking. No follow commands. Still  having fevers. Successful ultrasound and CT guided placement of a 10.2 French cholecystostomy tube. A small amount of aspirated bile was capped and sent to the laboratory for analysis 7/17 Off sedation, PSV.  Beginning to follow some commands occasionally intermittently.  Very jaundiced.  Growing Candida in the blood and BAL > diflucan.   7/21 Ongoing fevers, WBC increased 7/22 Persistent fevers, pan cultured with CT Chest/Head. Suspected source tracheobronchitis.  7/28 Transitioned to eliquis, jaundiced but labs improving  7/29 Agitation/restless.  Trazodone added QHS. Weaning midodrine, attempting diuresis  7/30 Increased Na, weaning on PSV.  8/01 On 40% ATC, significantly improved mental status 8/1>> ongoing attempts at trach weaning complicated by weakness, secretion management, and anxiety 8/8 started back on meropenem and milrinone 8/12 patient making slow improvements, he has been able to tolerate short course ATC 8/13 Weaned off milrinone 8/14 Code blue called for hypoxemia leading to PEA arrest. ROSC achieved after 6 min. 8/15: In  morning patient is unfazed from his recent events and continues asking for water to drink. Awake, alert and follows commands. 8/16 seen by surg per wound care recs-->"Do not recommend any acute surgical intervention. Will ultimately defer wound care recommendations to the wound care team, but I think it is reasonable to decrease hydrotherapy to 3x per week. I would increase dressing changes to BID and continue santyl for enzymatic debridement". 8/17: Using albumin and Lasix to try to mobilize anasarca 8/18: Tracheostomy downsized to 7.5.  Attempting short trials of aerosol trach collar 8/19-8/20 Now in a negative fluid balance.  Sodium continuing to rise.  Holding diuretics continuing to work on physical therapy and reconditioning, got a unit of blood in am for hgb 7.3. F/U hgb  was 6.8, had scant amt of bloody tracheal out-put but not enough to account for hgb  loss. No frank blood in stools. Got 2 more units of PRBCs. Hgb now 9.7. w/out obvious source. Complaining of more pain than last few days. More restless. Stopped arixtra again. Added fent patch for pain needs.  Consults:  Advanced heart failure, PCCM, TCTS  Procedures:  LUE PICC 7/5 >>7/21 R Brachial ALine 7/9 >>7/27 Chest Tubes >> out L IJ CVC 7/21 >>7/27 Peg tube 7/27 >> Tracheostomy downsized to 7.5 on 8/18 Significant Diagnostic Tests:  6/22 spirometry with restrictive physiology, preserved FEV1/FVC 6/18 echo: LVEF 29-79%, grade I diastolic dysfunction 8/92 US Abdomen cholelithiasis without cholecystitis. Mild wall thinking in setting of liver disease and ascites.  7/10 LE [place] negative for DVT  7/20 CT ABD w contrast >> stable position of the percutaneous cholecystostomy tube with complete decompression of the gallbladder, trace residual free fluid within the RUQ, decreased since prior, trace bilateral pleural effusions L>R, scattered areas of ground glass airspace disease within the RML, RLL 7/22 CT Chest w/o Contrast >> Retrosternal fluid collection, non specific.  Bilateral pleural effusions, portions loculated.   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 Stable bilateral mastoid and middle ear effusions.   Micro Data:  7/3 RQ> Few candida tropicalis, likely contaminant 7/12 BAL Cx> Candida tropicalis and albicans, likely contaminant 7/14 BCx >> candida parapsilosis 7/17 BCx >> neg resp 8/7 >> stenotrophomonas  Antimicrobials:  Meropenem 8/8 >>8/10 Diflucan  7/16 >> 8/9 vanc 7/22 >>8/10 levaquin 8/10 >>  Interim history/subjective:  Multiple complaints. "hot, hurts, abd/sacrum/head"  Objective   Blood pressure (Abnormal) 105/52, pulse 62, temperature 98.6 F (37 C), temperature source Oral, resp. rate 13, height _0  (1.753 m), weight 74 kg, SpO2 96 %.    Vent Mode: PSV;CPAP FiO2 (%):  [40  %] 40 % Set Rate:  [20 bmp] 20 bmp Vt Set:  [420 mL] 420 mL PEEP:  [5 cmH20] 5 cmH20 Pressure Support:  [8 cmH20] 8 cmH20 Plateau Pressure:  [21 cmH20-23 cmH20] 21 cmH20   Intake/Output Summary (Last 24 hours) at 09/28/2019 0835 Last data filed at 09/28/2019 0600 Gross per 24 hour  Intake 3040.03 ml  Output 3130 ml  Net -89.97 ml   Filed Weights   09/26/19 0600 09/27/19 0400 09/28/19 0500  Weight: 72.5 kg 71.2 kg 74 kg   Physical Exam: General frail 64 year old white male  HENT NCAT w/ exception of temporal wasting. Has 7.5 cuffed trach w/ scant bloody secretions MMM pulm scattered rhonchi. Equal chest rise. Now on PSV 8/peep 5 VTs in 400-500 range.  Card RRR w/ systolic murmur appreciated  abd tender to palp. No OM PEG not remarkable. Liq stools GU cnc yellow Neuro more restless w/ multiple areas of discomfort. Generally weak but can move all extremities.  EXT generalized weakness. LE edema and pelvic edema improved.   Resolved Hospital Problem list   Suspected HCAP Acute kidney injury Hypernatremia Acute Metabolic Encephalopathy  -CTH without acute intracranial pathology. EEG negative 7/23.  Shock Heparin-induced thrombocytopenia >> resolved, required bivalirudin 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 -Opthalmology evaluated , diflucan x 3 weeks PEA arrest 8/14 preceded by hypoxemia Assessment & Plan:   Acute hypoxic respiratory failure w/ need for mechanical ventilation d/t stenotrophomonas HCAP and severe weakness and deconditioning  Tracheostomy dependence  Wbc ct stable, no fever spike -does have mild/scant hemoptysis -pcxr w/ diffuse bilateral  airspace disease. No improvement but not really worse than last film Plan Day 11/14 levaquin Cont PSV as tolerated. Will initiate MANDATORY ATC trials daily Need to get him OOB But his sacral wound is barrier to this Cont routine trach care Awaiting SLP to return to attempt in-Line  PMV   Cardiogenic/hemorrhagic shock status post CABG complicated by VT arrest,  S/p VA ECMO (decanulated 6/28).  S/p impella placement (6/28> 7/9) Plan Per thoracic team   Atrial flutter , reverted to nSR Plan Cont amiodarone and asa Cont tele Keeps having episodes of bleedings/anemia so this contradicts systemic AC   Fluid and electrolyte imbalance: Total body overload (improved), Hypernatremia  Plan Add low dose VT lasix Adjust free water to q2 Am chemistry    Hyperbilirubinemia due to acalculous cholecystitis vs drug-induced cholestasis Possible cirrhosis per 7/8 [place]  Status post percutaneous GB drain by IR on 7/17 Jaundice-bilirubin  LFTs stable  Plan GB drain to stay 6-8 weeks (per IR). We are just over 4 weeks currently  Trend intermittent LFTs.   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 Source of blood loss not clear. Not tachycardic and no hemodynamic indication of active bleeding however he is more anxious and restless than usual.   Plan Stop Aritra Cont to trend CBC (change to q 8hrs for now) Low threshold to image him BUT not sure what we would do with this as at this point I don't see how this man could survive another procedure   Anxiety & Pain-->increased anxiety and restlessness on 8/20-->etiology not clear -->bleeding? Infection?  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 If spikes fever will pan culture and scan chest/abd/pelvis  Sacral Decubitus  Plan Cont wound care Cont to maximize nutrition Pressure relief measures     Deconditioning of Critical Illness  Plan He will need extensive PT/OT and  LTAC   Small left adrenal mass seen on CT scan of the abdomen August 24, 2019 Plan Out-pt follow up  Daily Goals Checklist  Pain/Anxiety/Delirium protocol (if indicated): see above  VAP protocol (if indicated): bundle in place.  DVT prophylaxis:arixtra (resumed 8/16) Nutrition Status: TF  GI prophylaxis: PPI Urinary catheter: n/a Central lines: removed Glucose control: See above Mobility/therapy needs: PT for ROM  Code Status: Full  Family Communication: per primary Disposition: ICU -- await LTACH  cct 49mn   Sissi Padia E Karna Abed ACNP-BC LSouth ZanesvillePager # [phone] # [phone] no answer

## 2019-09-28 NOTE — Progress Notes (Signed)
RT NOTE: Pt attempted on  60% ATC. Sats decreased to 89%  and pt complaining of SOB. Pt placed back on PSV 8/5. RT to continue to monitor.

## 2019-09-28 NOTE — Progress Notes (Signed)
RT NOTE: Pt  dropped sats to 86% during wound care. Pt placed back on full vent support and sats increased to 94%. RT to continue to monitor.

## 2019-09-29 LAB — COMPREHENSIVE METABOLIC PANEL
ALT: 73 U/L — ABNORMAL HIGH (ref 0–44)
AST: 82 U/L — ABNORMAL HIGH (ref 15–41)
Albumin: 1.7 g/dL — ABNORMAL LOW (ref 3.5–5.0)
Alkaline Phosphatase: 645 U/L — ABNORMAL HIGH (ref 38–126)
Anion gap: 7 (ref 5–15)
BUN: 103 mg/dL — ABNORMAL HIGH (ref 8–23)
CO2: 37 mmol/L — ABNORMAL HIGH (ref 22–32)
Calcium: 8.3 mg/dL — ABNORMAL LOW (ref 8.9–10.3)
Chloride: 108 mmol/L (ref 98–111)
Creatinine, Ser: 1.04 mg/dL (ref 0.61–1.24)
GFR calc Af Amer: >60 mL/min (ref >60)
GFR calc non Af Amer: >60 mL/min (ref >60)
Glucose, Bld: 146 mg/dL — ABNORMAL HIGH (ref 70–99)
Potassium: 4.5 mmol/L (ref 3.5–5.1)
Sodium: 152 mmol/L — ABNORMAL HIGH (ref 135–145)
Total Bilirubin: 4.9 mg/dL — ABNORMAL HIGH (ref 0.3–1.2)
Total Protein: 5.9 g/dL — ABNORMAL LOW (ref 6.5–8.1)

## 2019-09-29 LAB — GLUCOSE, CAPILLARY
Glucose-Capillary: 131 mg/dL — ABNORMAL HIGH (ref 70–99)
Glucose-Capillary: 140 mg/dL — ABNORMAL HIGH (ref 70–99)
Glucose-Capillary: 143 mg/dL — ABNORMAL HIGH (ref 70–99)
Glucose-Capillary: 200 mg/dL — ABNORMAL HIGH (ref 70–99)
Glucose-Capillary: 195 mg/dL — ABNORMAL HIGH (ref 70–99)
Glucose-Capillary: 139 mg/dL — ABNORMAL HIGH (ref 70–99)
Glucose-Capillary: 161 mg/dL — ABNORMAL HIGH (ref 70–99)

## 2019-09-29 LAB — CBC
HCT: 33.1 % — ABNORMAL LOW (ref 39.0–52.0)
Hemoglobin: 9.8 g/dL — ABNORMAL LOW (ref 13.0–17.0)
MCH: 29 pg (ref 26.0–34.0)
MCHC: 29.6 g/dL — ABNORMAL LOW (ref 30.0–36.0)
MCV: 97.9 fL (ref 80.0–100.0)
Platelets: 248 10*3/uL (ref 150–400)
RBC: 3.38 MIL/uL — ABNORMAL LOW (ref 4.22–5.81)
RDW: 18.7 % — ABNORMAL HIGH (ref 11.5–15.5)
WBC: 15.7 10*3/uL — ABNORMAL HIGH (ref 4.0–10.5)
nRBC: 0 % (ref 0.0–0.2)

## 2019-09-29 MED ORDER — FREE WATER
500.0000 mL | Status: DC
  Administered 2019-09-29 – 2019-10-02 (×23): 500 mL

## 2019-09-29 MED ORDER — METOLAZONE 5 MG PO TABS
5.0000 mg | ORAL_TABLET | Freq: Once | ORAL | Status: AC
  Administered 2019-09-29: 5 mg via ORAL
  Filled 2019-09-29: qty 1

## 2019-09-29 MED ORDER — CHLORHEXIDINE GLUCONATE 0.12 % MT SOLN
OROMUCOSAL | Status: AC
  Administered 2019-09-29: 15 mL via OROMUCOSAL
  Filled 2019-09-29: qty 15

## 2019-09-29 MED ORDER — FONDAPARINUX SODIUM 2.5 MG/0.5ML ~~LOC~~ SOLN
2.5000 mg | Freq: Every day | SUBCUTANEOUS | Status: DC
  Administered 2019-09-29 – 2019-10-02 (×4): 2.5 mg via SUBCUTANEOUS
  Filled 2019-09-29 (×4): qty 0.5

## 2019-09-29 NOTE — Progress Notes (Signed)
Patient ID: Todd [name], male   DOB: 1955/05/05, 64 y.o.   MRN: [id number] TCTS Evening Rounds:   Hemodynamically stable in sinus rhythm. Afebrile  Remains on PS 14.  Good urine output

## 2019-09-29 NOTE — Progress Notes (Signed)
NAME:  Todd [name], MRN:  [id number], DOB:  [birth year], LOS: 39 ADMISSION DATE:  07/26/2019, CONSULTATION DATE:  08/03/19 REFERRING MD:  Roxan Hockey, CHIEF COMPLAINT:  ECMO   Brief History   64 yo male admitted with CHF exacerbation, found to have multi-vessel disease requiring CABG 6/24, post op VT Arrest 6/25 and cannulated for VA ECMO. S/p decannulation and impella.  History of present illness   Presented with worsening dyspnea 6/17 c/w CHF exacerbation.  LHC  with severe triple vessel disease.  Underwent CABG 6/24. Vtach arrest 6/25.  Chest opened bedside and cardiac massage initiated as well as multiple cardioversions, amiodarone, bicarb etc.  Brought to OR and cannulated for VA ECMO.  PCCM consulted to assist with management  Comorbidities include DM, heavy smoking, COPD  Past Medical History  Depression, Ischemic cardiomyopathy, HTN, [facility] Events   6/24 CABG 6/25 VA cannulation 6/28 decannulated and impella placed 6/29 bedside re-exploration; s/p decannulation. Placement of impella device originally at p8 but decreased to p4 2/2 suction events overnight up to p6. Echo completed and repositioned device. Remained on considerable support with 8 epi and 46 norepi vaso 0.05. continued chest tube output with large clots noted. Heparin thru device. Replacement products ongoing. 60% 8 peep 6/30 bedside mediastinal re-exploration and clean out of hematoma with tamponade and worsening hemodynamics. Pt had large volume transfusion. rebolused with amio 2/2 nsvt episode.  Weight up 48 pounds.  Started diuresis, Lasix drip started. 7/01 iatrogenic respiratory alkalosis, vent rate decreased. 7/02 No significant issues overnight remains on pressors and Impella not tolerating tube feeds with high gastric output awaiting core track placement 7/03 started trickle TF  7/05 Swab removed, CVL and PICC placed. 7/13 Trach and BAL 7/16 Awake and tracking. No follow commands. Still  having fevers. Successful ultrasound and CT guided placement of a 10.2 French cholecystostomy tube. A small amount of aspirated bile was capped and sent to the laboratory for analysis 7/17 Off sedation, PSV.  Beginning to follow some commands occasionally intermittently.  Very jaundiced.  Growing Candida in the blood and BAL > diflucan.   7/21 Ongoing fevers, WBC increased 7/22 Persistent fevers, pan cultured with CT Chest/Head. Suspected source tracheobronchitis.  7/28 Transitioned to eliquis, jaundiced but labs improving  7/29 Agitation/restless.  Trazodone added QHS. Weaning midodrine, attempting diuresis  7/30 Increased Na, weaning on PSV.  8/01 On 40% ATC, significantly improved mental status 8/1>> ongoing attempts at trach weaning complicated by weakness, secretion management, and anxiety 8/8 started back on meropenem and milrinone 8/12 patient making slow improvements, he has been able to tolerate short course ATC 8/13 Weaned off milrinone 8/14 Code blue called for hypoxemia leading to PEA arrest. ROSC achieved after 6 min. 8/15: In  morning patient is unfazed from his recent events and continues asking for water to drink. Awake, alert and follows commands. 8/16 seen by surg per wound care recs-->"Do not recommend any acute surgical intervention. Will ultimately defer wound care recommendations to the wound care team, but I think it is reasonable to decrease hydrotherapy to 3x per week. I would increase dressing changes to BID and continue santyl for enzymatic debridement". 8/17: Using albumin and Lasix to try to mobilize anasarca 8/18: Tracheostomy downsized to 7.5.  Attempting short trials of aerosol trach collar 8/19-8/20 Now in a negative fluid balance.  Sodium continuing to rise.  Holding diuretics continuing to work on physical therapy and reconditioning, got a unit of blood in am for hgb 7.3. F/U hgb  was 6.8, had scant amt of bloody tracheal out-put but not enough to account for hgb  loss. No frank blood in stools. Got 2 more units of PRBCs. Hgb now 9.7. w/out obvious source. Complaining of more pain than last few days. More restless. Stopped arixtra again. Added fent patch for pain needs.  Consults:  Advanced heart failure, PCCM, TCTS  Procedures:  LUE PICC 7/5 >>7/21 R Brachial ALine 7/9 >>7/27 Chest Tubes >> out L IJ CVC 7/21 >>7/27 Peg tube 7/27 >> Tracheostomy downsized to 7.5 on 8/18 Significant Diagnostic Tests:  6/22 spirometry with restrictive physiology, preserved FEV1/FVC 6/18 echo: LVEF 65-68%, grade I diastolic dysfunction 1/27 US Abdomen cholelithiasis without cholecystitis. Mild wall thinking in setting of liver disease and ascites.  7/10 LE [place] negative for DVT  7/20 CT ABD w contrast >> stable position of the percutaneous cholecystostomy tube with complete decompression of the gallbladder, trace residual free fluid within the RUQ, decreased since prior, trace bilateral pleural effusions L>R, scattered areas of ground glass airspace disease within the RML, RLL 7/22 CT Chest w/o Contrast >> Retrosternal fluid collection, non specific.  Bilateral pleural effusions, portions loculated.   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 Stable bilateral mastoid and middle ear effusions.   Micro Data:  7/3 RQ> Few candida tropicalis, likely contaminant 7/12 BAL Cx> Candida tropicalis and albicans, likely contaminant 7/14 BCx >> candida parapsilosis 7/17 BCx >> neg resp 8/7 >> stenotrophomonas  Antimicrobials:  Meropenem 8/8 >>8/10 Diflucan  7/16 >> 8/9 vanc 7/22 >>8/10 levaquin 8/10 >>  Interim history/subjective:  No events. Denies pain or dyspnea. On PS.  Objective   Blood pressure (!) 115/57, pulse 67, temperature 98.1 F (36.7 C), temperature source Oral, resp. rate 10, height _0  (1.753 m), weight 74.9 kg, SpO2 93 %.    Vent Mode: PRVC FiO2 (%):  [40 %] 40 % Set  Rate:  [20 bmp] 20 bmp Vt Set:  [420 mL] 420 mL PEEP:  [0 cmH20-5 cmH20] 5 cmH20 Pressure Support:  [8 cmH20-14 cmH20] 8 cmH20 Plateau Pressure:  [22 cmH20-24 cmH20] 22 cmH20   Intake/Output Summary (Last 24 hours) at 09/29/2019 5170 Last data filed at 09/29/2019 0700 Gross per 24 hour  Intake 2530.73 ml  Output 1625 ml  Net 905.73 ml   Filed Weights   09/27/19 0400 09/28/19 0500 09/29/19 0500  Weight: 71.2 kg 74 kg 74.9 kg   Physical Exam: YFV:CBSWHQPRF frail elderly man on vent HEENT:MM dry, tracheostomy CDI  CV:RRR, ext warm PULM:Scattered rhonci, triggering vent FM:BWGY, +BS, RUQ drain in place with bilious output KZL:DJTTSV wasting,  ischemic changes on right finger, scattered bruising NEURO:moves all 4 ext to command, profoundly weak PSYCH:RASS 0 SKIN:Jandiced, bruising as above  Sodium up Albumin remains low Bili stably elevated Sugars okay CBC stable with mild elevation of WBC  Resolved Hospital Problem list   Suspected HCAP Acute kidney injury Hypernatremia Acute Metabolic Encephalopathy  -CTH without acute intracranial pathology. EEG negative 7/23.  Shock Heparin-induced thrombocytopenia >> resolved, required bivalirudin 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 -Opthalmology evaluated , diflucan x 3 weeks PEA arrest 8/14 preceded by hypoxemia Assessment & Plan:   Acute hypoxic respiratory failure w/ need for mechanical ventilation d/t stenotrophomonas HCAP and severe weakness and deconditioning  Tracheostomy dependence  - Finishing out 14 day course of levaquin - Daily TC trials - Mobility as allowed by sacral wound   Cardiogenic/hemorrhagic shock status post CABG complicated  by VT arrest,  S/p VA ECMO (decanulated 6/28).  S/p impella placement (6/28> 7/9) Atrial flutter , reverted to nSR - Per thoracic team   Fluid and electrolyte imbalance: Total body overload (improved), Hypernatremia  - Increase free water  Hyperbilirubinemia  due to acalculous cholecystitis vs drug-induced cholestasis Possible cirrhosis per 7/8 [place]  Status post percutaneous GB drain by IR on 7/17 Jaundice-bilirubin - LFTs stable  -GB drain to stay 6-8 weeks (per IR). We are just over 4 weeks currently  -Trend intermittent LFTs.   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  Has now received another 3 unit of blood between 8/19 and 8/20. Source of blood loss not clear. Not tachycardic and no hemodynamic indication of active bleeding. - Rechallenge with arixtra, pan-scan if drops significantly again  Anxiety & Pain-ICU delirium and PTSD, waxes and wanes -Cont Prozac, Seroquel, klonopin, fent patch   Sacral Decubitus  -Cont wound care -Cont to maximize nutrition -Pressure relief measures     Deconditioning of Critical Illness  -He will need extensive PT/OT and LTAC  Small left adrenal mass seen on CT scan of the abdomen August 24, 2019 - Out-pt follow up  Daily Goals Checklist  Pain/Anxiety/Delirium protocol (if indicated): see above  VAP protocol (if indicated): bundle in place.  DVT prophylaxis:arixtra (resumed 8/21) Nutrition Status: TF  GI prophylaxis: PPI Urinary catheter: n/a Central lines: removed Glucose control: See above Mobility/therapy needs: PT for ROM  Code Status: Full  Family Communication: per primary Disposition: ICU -- await LTACH    The patient is critically ill with multiple organ systems failure and requires high complexity decision making for assessment and support, frequent evaluation and titration of therapies, application of advanced monitoring technologies and extensive interpretation of multiple databases. Critical Care Time devoted to patient care services described in this note independent of APP/resident time (if applicable)  is 32 minutes.   Erskine Emery MD [facility]  Critical Care 09/29/2019 7:13 AM Personal pager: [phone] If unanswered, please page CCM On-call: [phone]

## 2019-09-29 NOTE — Progress Notes (Signed)
43 Days Post-Op Procedure(s) (LRB): REMOVAL OF IMPELLA LEFT VENTRICULAR ASSIST DEVICE (N/A) TRANSESOPHAGEAL ECHOCARDIOGRAM (TEE) (N/A) Subjective: Trached on vent but responds appropriately. Follows commands.  Objective: Vital signs in last 24 hours: Temp:  [97.8 F (36.6 C)-98.7 F (37.1 C)] 98.7 F (37.1 C) (08/21 1111) Pulse Rate:  [59-74] 72 (08/21 1105) Cardiac Rhythm: Normal sinus rhythm (08/21 0800) Resp:  [8-21] 10 (08/21 1100) BP: (101-138)/(51-91) 121/60 (08/21 1100) SpO2:  [91 %-100 %] 94 % (08/21 1105) FiO2 (%):  [40 %] 40 % (08/21 1105) Weight:  [74.9 kg] 74.9 kg (08/21 0500)  Hemodynamic parameters for last 24 hours:    Intake/Output from previous day: 08/20 0701 - 08/21 0700 In: 2530.7 [NG/GT:2510; IV Piggyback:20.7] Out: 1625 [Urine:1100; Drains:225; Stool:300] Intake/Output this shift: Total I/O In: 129.3 [IV Piggyback:129.3] Out: 750 [Urine:650; Stool:100]  General appearance: cooperative Neurologic: non-focal Heart: regular rate and rhythm, S1, S2 normal, no murmur Lungs: clear to auscultation bilaterally Abdomen: soft, non-tender; bowel sounds normal Extremities: edema mild Wound: chest incision well-healed  Lab Results: Recent Labs    09/28/19 0438 09/29/19 0416  WBC 14.8* 15.7*  HGB 9.7* 9.8*  HCT 32.8* 33.1*  PLT 242 248   BMET:  Recent Labs    09/28/19 0438 09/29/19 0416  NA 150* 152*  K 4.3 4.5  CL 108 108  CO2 35* 37*  GLUCOSE 123* 146*  BUN 103* 103*  CREATININE 1.06 1.04  CALCIUM 8.2* 8.3*    PT/INR:  Recent Labs    09/27/19 2049  LABPROT 15.3*  INR 1.3*   ABG    Component Value Date/Time   PHART 7.366 09/22/2019 2147   HCO3 39.3 (H) 09/22/2019 2147   TCO2 41 (H) 09/22/2019 2147   ACIDBASEDEF 1.0 08/08/2019 1115   O2SAT 68.5 09/27/2019 0505   CBG (last 3)  Recent Labs    09/29/19 0327 09/29/19 0731 09/29/19 1112  GLUCAP 140* 143* 200*    Assessment/Plan: S/P Procedure(s) (LRB): REMOVAL OF IMPELLA  LEFT VENTRICULAR ASSIST DEVICE (N/A) TRANSESOPHAGEAL ECHOCARDIOGRAM (TEE) (N/A)  Hemodynamically stable in sinus rhythm on midodrine and amio.  Vent weaning as tolerated per CCM  Continue tube feeds for nutrition. Free water increased for hypernatremia.  Anemia of unclear source, blood loss vs critical illness, blood draws. Received 3 units of blood 8/19-20. Follow.     LOS: 65 days    Alleen Borne 09/29/2019

## 2019-09-30 LAB — COMPREHENSIVE METABOLIC PANEL
ALT: 72 U/L — ABNORMAL HIGH (ref 0–44)
AST: 85 U/L — ABNORMAL HIGH (ref 15–41)
Albumin: 1.7 g/dL — ABNORMAL LOW (ref 3.5–5.0)
Alkaline Phosphatase: 639 U/L — ABNORMAL HIGH (ref 38–126)
Anion gap: 8 (ref 5–15)
BUN: 114 mg/dL — ABNORMAL HIGH (ref 8–23)
CO2: 35 mmol/L — ABNORMAL HIGH (ref 22–32)
Calcium: 8.3 mg/dL — ABNORMAL LOW (ref 8.9–10.3)
Chloride: 106 mmol/L (ref 98–111)
Creatinine, Ser: 1.15 mg/dL (ref 0.61–1.24)
GFR calc Af Amer: >60 mL/min (ref >60)
GFR calc non Af Amer: >60 mL/min (ref >60)
Glucose, Bld: 128 mg/dL — ABNORMAL HIGH (ref 70–99)
Potassium: 4.5 mmol/L (ref 3.5–5.1)
Sodium: 149 mmol/L — ABNORMAL HIGH (ref 135–145)
Total Bilirubin: 5.5 mg/dL — ABNORMAL HIGH (ref 0.3–1.2)
Total Protein: 6.1 g/dL — ABNORMAL LOW (ref 6.5–8.1)

## 2019-09-30 LAB — CBC
HCT: 32.7 % — ABNORMAL LOW (ref 39.0–52.0)
Hemoglobin: 9.6 g/dL — ABNORMAL LOW (ref 13.0–17.0)
MCH: 28.9 pg (ref 26.0–34.0)
MCHC: 29.4 g/dL — ABNORMAL LOW (ref 30.0–36.0)
MCV: 98.5 fL (ref 80.0–100.0)
Platelets: 236 10*3/uL (ref 150–400)
RBC: 3.32 MIL/uL — ABNORMAL LOW (ref 4.22–5.81)
RDW: 18.1 % — ABNORMAL HIGH (ref 11.5–15.5)
WBC: 16.3 10*3/uL — ABNORMAL HIGH (ref 4.0–10.5)
nRBC: 0 % (ref 0.0–0.2)

## 2019-09-30 LAB — GLUCOSE, CAPILLARY
Glucose-Capillary: 105 mg/dL — ABNORMAL HIGH (ref 70–99)
Glucose-Capillary: 110 mg/dL — ABNORMAL HIGH (ref 70–99)
Glucose-Capillary: 123 mg/dL — ABNORMAL HIGH (ref 70–99)
Glucose-Capillary: 63 mg/dL — ABNORMAL LOW (ref 70–99)
Glucose-Capillary: 76 mg/dL (ref 70–99)
Glucose-Capillary: 84 mg/dL (ref 70–99)

## 2019-09-30 LAB — PHOSPHORUS: Phosphorus: 5.1 mg/dL — ABNORMAL HIGH (ref 2.5–4.6)

## 2019-09-30 LAB — MAGNESIUM: Magnesium: 2.9 mg/dL — ABNORMAL HIGH (ref 1.7–2.4)

## 2019-09-30 MED ORDER — DEXTROSE 50 % IV SOLN
12.5000 g | INTRAVENOUS | Status: AC
  Administered 2019-10-01: 12.5 g via INTRAVENOUS

## 2019-09-30 NOTE — Plan of Care (Signed)
°  Problem: Cardiac: Goal: Ability to achieve and maintain adequate cardiopulmonary perfusion will improve Outcome: Progressing   Problem: Activity: Goal: Risk for activity intolerance will decrease Outcome: Progressing   Problem: Cardiac: Goal: Will achieve and/or maintain hemodynamic stability Outcome: Progressing   Problem: Respiratory: Goal: Respiratory status will improve Outcome: Not Progressing   Problem: Urinary Elimination: Goal: Ability to achieve and maintain adequate renal perfusion and functioning will improve Outcome: Progressing

## 2019-09-30 NOTE — Progress Notes (Signed)
44 Days Post-Op Procedure(s) (LRB): REMOVAL OF IMPELLA LEFT VENTRICULAR ASSIST DEVICE (N/A) TRANSESOPHAGEAL ECHOCARDIOGRAM (TEE) (N/A) Subjective:  Trached on vent.  Had stable day yesterday. Remains on PS which was decreased to 12. TV 600's. Minimal secretions per nurse.  Objective: Vital signs in last 24 hours: Temp:  [98.4 F (36.9 C)-99.2 F (37.3 C)] 98.5 F (36.9 C) (08/22 0738) Pulse Rate:  [69-79] 79 (08/22 0700) Cardiac Rhythm: Normal sinus rhythm;Heart block (08/22 0000) Resp:  [8-22] 20 (08/22 0700) BP: (106-129)/(45-63) 118/57 (08/22 0700) SpO2:  [89 %-98 %] 95 % (08/22 0738) FiO2 (%):  [40 %] 40 % (08/22 0738) Weight:  [74.6 kg] 74.6 kg (08/22 0500)  Hemodynamic parameters for last 24 hours:    Intake/Output from previous day: 08/21 0701 - 08/22 0700 In: 1699.3 [P.O.:20; NG/GT:1400; IV Piggyback:279.3] Out: 3185 [Urine:2435; Drains:650; Stool:100] Intake/Output this shift: No intake/output data recorded.  Neurologic: non-focal Heart: regular rate and rhythm, S1, S2 normal, no murmur, click, rub or gallop Lungs: few rhonchi Abdomen: soft, non-tender; bowel sounds normal Extremities: mild anasarca Wound: chest incision well-healed  Lab Results: Recent Labs    09/29/19 0416 09/30/19 0351  WBC 15.7* 16.3*  HGB 9.8* 9.6*  HCT 33.1* 32.7*  PLT 248 236   BMET:  Recent Labs    09/29/19 0416 09/30/19 0351  NA 152* 149*  K 4.5 4.5  CL 108 106  CO2 37* 35*  GLUCOSE 146* 128*  BUN 103* 114*  CREATININE 1.04 1.15  CALCIUM 8.3* 8.3*    PT/INR:  Recent Labs    09/27/19 2049  LABPROT 15.3*  INR 1.3*   ABG    Component Value Date/Time   PHART 7.366 09/22/2019 2147   HCO3 39.3 (H) 09/22/2019 2147   TCO2 41 (H) 09/22/2019 2147   ACIDBASEDEF 1.0 08/08/2019 1115   O2SAT 68.5 09/27/2019 0505   CBG (last 3)  Recent Labs    09/29/19 2316 09/30/19 0330 09/30/19 0744  GLUCAP 161* 123* 84    Assessment/Plan: S/P Procedure(s) (LRB): REMOVAL  OF IMPELLA LEFT VENTRICULAR ASSIST DEVICE (N/A) TRANSESOPHAGEAL ECHOCARDIOGRAM (TEE) (N/A)  Hemodynamically stable in sinus rhythm on midodrine and amio.  Vent weaning as tolerated per CCM. PS decreased to 12 with good TV.  Continue tube feeds for nutrition. Free water increased for hypernatremia with slight improvement.  Anemia of unclear source, blood loss vs critical illness, blood draws. Received 3 units of blood 8/19-20. Stable from yesterday.  Stable elevated LFT's and low albumin.   LOS: 66 days    Alleen Borne 09/30/2019

## 2019-09-30 NOTE — Progress Notes (Signed)
eLink Physician-Brief Progress Note Patient Name: Todd [name] DOB: 1955/05/22 MRN: [id number]   Date of Service  09/30/2019  HPI/Events of Note  Pt had projectile vomiting after bath. TF was turned off prior to bath. RN gave Zofran and wanted to know if she should hold the TF and if there is any other intervention she needs to do. Perc cholecystostomy tube 7.16. AST/ALT mild elevated.   eICU Interventions  - resume TF, watch for residuals. Call back if residuals over 350 or so.  - asp precautions.      Intervention Category Intermediate Interventions: Other:  Ranee Gosselin 09/30/2019, 10:05 PM

## 2019-09-30 NOTE — Progress Notes (Signed)
NAME:  Todd [name], MRN:  [id number], DOB:  1955/04/05, LOS: 31 ADMISSION DATE:  07/26/2019, CONSULTATION DATE:  08/03/19 REFERRING MD:  Roxan Hockey, CHIEF COMPLAINT:  ECMO   Brief History   64 yo male admitted with CHF exacerbation, found to have multi-vessel disease requiring CABG 6/24, post op VT Arrest 6/25 and cannulated for VA ECMO. S/p decannulation and impella.  History of present illness   Presented with worsening dyspnea 6/17 c/w CHF exacerbation.  LHC  with severe triple vessel disease.  Underwent CABG 6/24. Vtach arrest 6/25.  Chest opened bedside and cardiac massage initiated as well as multiple cardioversions, amiodarone, bicarb etc.  Brought to OR and cannulated for VA ECMO.  PCCM consulted to assist with management  Comorbidities include DM, heavy smoking, COPD  Past Medical History  Depression, Ischemic cardiomyopathy, HTN, [place] [facility] Events   6/24 CABG 6/25 VA cannulation 6/28 decannulated and impella placed 6/29 bedside re-exploration; s/p decannulation. Placement of impella device originally at p8 but decreased to p4 2/2 suction events overnight up to p6. Echo completed and repositioned device. Remained on considerable support with 8 epi and 46 norepi vaso 0.05. continued chest tube output with large clots noted. Heparin thru device. Replacement products ongoing. 60% 8 peep 6/30 bedside mediastinal re-exploration and clean out of hematoma with tamponade and worsening hemodynamics. Pt had large volume transfusion. rebolused with amio 2/2 nsvt episode.  Weight up 48 pounds.  Started diuresis, Lasix drip started. 7/01 iatrogenic respiratory alkalosis, vent rate decreased. 7/02 No significant issues overnight remains on pressors and Impella not tolerating tube feeds with high gastric output awaiting core track placement 7/03 started trickle TF  7/05 Swab removed, CVL and PICC placed. 7/13 Trach and BAL 7/16 Awake and tracking. No follow commands. Still  having fevers. Successful ultrasound and CT guided placement of a 10.2 French cholecystostomy tube. A small amount of aspirated bile was capped and sent to the laboratory for analysis 7/17 Off sedation, PSV.  Beginning to follow some commands occasionally intermittently.  Very jaundiced.  Growing Candida in the blood and BAL > diflucan.   7/21 Ongoing fevers, WBC increased 7/22 Persistent fevers, pan cultured with CT Chest/Head. Suspected source tracheobronchitis.  7/28 Transitioned to eliquis, jaundiced but labs improving  7/29 Agitation/restless.  Trazodone added QHS. Weaning midodrine, attempting diuresis  7/30 Increased Na, weaning on PSV.  8/01 On 40% ATC, significantly improved mental status 8/1>> ongoing attempts at trach weaning complicated by weakness, secretion management, and anxiety 8/8 started back on meropenem and milrinone 8/12 patient making slow improvements, he has been able to tolerate short course ATC 8/13 Weaned off milrinone 8/14 Code blue called for hypoxemia leading to PEA arrest. ROSC achieved after 6 min. 8/15: In  morning patient is unfazed from his recent events and continues asking for water to drink. Awake, alert and follows commands. 8/16 seen by surg per wound care recs-->"Do not recommend any acute surgical intervention. Will ultimately defer wound care recommendations to the wound care team, but I think it is reasonable to decrease hydrotherapy to 3x per week. I would increase dressing changes to BID and continue santyl for enzymatic debridement". 8/17: Using albumin and Lasix to try to mobilize anasarca 8/18: Tracheostomy downsized to 7.5.  Attempting short trials of aerosol trach collar 8/19-8/20 Now in a negative fluid balance.  Sodium continuing to rise.  Holding diuretics continuing to work on physical therapy and reconditioning, got a unit of blood in am for hgb 7.3. F/U hgb  was 6.8, had scant amt of bloody tracheal out-put but not enough to account for hgb  loss. No frank blood in stools. Got 2 more units of PRBCs. Hgb now 9.7. w/out obvious source. Complaining of more pain than last few days. More restless. Stopped arixtra again. Added fent patch for pain needs.  Consults:  Advanced heart failure, PCCM, TCTS  Procedures:  LUE PICC 7/5 >>7/21 R Brachial ALine 7/9 >>7/27 Chest Tubes >> out L IJ CVC 7/21 >>7/27 Peg tube 7/27 >> Tracheostomy downsized to 7.5 on 8/18 Significant Diagnostic Tests:  6/22 spirometry with restrictive physiology, preserved FEV1/FVC 6/18 echo: LVEF 36-64%, grade I diastolic dysfunction 4/03 US Abdomen cholelithiasis without cholecystitis. Mild wall thinking in setting of liver disease and ascites.  7/10 LE [place] negative for DVT  7/20 CT ABD w contrast >> stable position of the percutaneous cholecystostomy tube with complete decompression of the gallbladder, trace residual free fluid within the RUQ, decreased since prior, trace bilateral pleural effusions L>R, scattered areas of ground glass airspace disease within the RML, RLL 7/22 CT Chest w/o Contrast >> Retrosternal fluid collection, non specific.  Bilateral pleural effusions, portions loculated.   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 Stable bilateral mastoid and middle ear effusions.   Micro Data:  7/3 RQ> Few candida tropicalis, likely contaminant 7/12 BAL Cx> Candida tropicalis and albicans, likely contaminant 7/14 BCx >> candida parapsilosis 7/17 BCx >> neg resp 8/7 >> stenotrophomonas  Antimicrobials:  Meropenem 8/8 >>8/10 Diflucan  7/16 >> 8/9 vanc 7/22 >>8/10 levaquin 8/10 >>  Interim history/subjective:  No events, denies pain. Thick secretions and weak cough continue to limit PS trials.  Objective   Blood pressure (!) 123/54, pulse 76, temperature 98.4 F (36.9 C), temperature source Axillary, resp. rate 17, height _0  (1.753 m), weight 74.6 kg, SpO2 95 %.      Vent Mode: PRVC FiO2 (%):  [40 %] 40 % Set Rate:  [20 bmp] 20 bmp Vt Set:  [420 mL] 420 mL PEEP:  [5 cmH20] 5 cmH20 Pressure Support:  [14 cmH20] 14 cmH20 Plateau Pressure:  [18 cmH20] 18 cmH20   Intake/Output Summary (Last 24 hours) at 09/30/2019 4742 Last data filed at 09/30/2019 0600 Gross per 24 hour  Intake 1769.31 ml  Output 3185 ml  Net -1415.69 ml   Filed Weights   09/28/19 0500 09/29/19 0500 09/30/19 0500  Weight: 74 kg 74.9 kg 74.6 kg   Physical Exam: VZD:GLOVFIEPP frail elderly man on vent HEENT:MM dry, tracheostomy CDI, tenacious thick secretions CV:RRR, ext warm PULM:Scattered rhonci, triggering vent IR:JJOA, +BS, RUQ drain in place with bilious output CZY:SAYTKZ wasting,  ischemic changes on right finger, scattered bruising NEURO:moves all 4 ext to command, profoundly weak PSYCH:RASS 0 SKIN:Jandiced, bruising as above  Sodium improved Albumin remains low Bili stably elevated Sugars okay CBC stable with mild elevation of WBC  Resolved Hospital Problem list   Suspected HCAP Acute kidney injury Hypernatremia Acute Metabolic Encephalopathy  -CTH without acute intracranial pathology. EEG negative 7/23.  Shock Heparin-induced thrombocytopenia >> resolved, required bivalirudin 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 -Opthalmology evaluated , diflucan x 3 weeks PEA arrest 8/14 preceded by hypoxemia Assessment & Plan:   Acute hypoxic respiratory failure w/ need for mechanical ventilation d/t stenotrophomonas HCAP and severe weakness and deconditioning  Tracheostomy dependence  - Finishing out 14 day course of levaquin - PS trials for now, does not have mucus clearance to do TC  at this time and has severe desats/plugging when trials occur - Mobility as allowed by sacral wound   Cardiogenic/hemorrhagic shock status post CABG complicated by VT arrest,  S/p VA ECMO (decanulated 6/28).  S/p impella placement (6/28> 7/9) Atrial flutter ,  reverted to nSR - Per thoracic team   Fluid and electrolyte imbalance: Total body overload (improved), Hypernatremia  - Continue free water goal sodium 140-145 if able  Hyperbilirubinemia due to acalculous cholecystitis vs drug-induced cholestasis Possible cirrhosis per 7/8 [place]  Status post percutaneous GB drain by IR on 7/17 Jaundice-bilirubin - LFTs stable  -GB drain to stay 6-8 weeks (per IR). We are just over 4 weeks currently  -Trend intermittent LFTs.   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  Has now received another 3 unit of blood between 8/19 and 8/20. Source of blood loss not clear. Not tachycardic and no hemodynamic indication of active bleeding. - Continue arixtra, consider pan-scan if drops significantly again  Anxiety & Pain-ICU delirium and PTSD, waxes and wanes -Cont Prozac, seroquel, klonopin, fent patch   Sacral Decubitus  -Cont wound care -Cont to maximize nutrition -Pressure relief measures     Deconditioning of Critical Illness  -He will need extensive PT/OT and LTAC  Small left adrenal mass seen on CT scan of the abdomen August 24, 2019 - Out-pt follow up  Daily Goals Checklist  Pain/Anxiety/Delirium protocol (if indicated): see above  VAP protocol (if indicated): bundle in place.  DVT prophylaxis:arixtra (resumed 8/21) Nutrition Status: TF  GI prophylaxis: PPI Urinary catheter: n/a Central lines: removed Glucose control: See above Mobility/therapy needs: PT for ROM  Code Status: Full  Family Communication: per primary Disposition: ICU -- await LTACH   The patient is critically ill with multiple organ systems failure and requires high complexity decision making for assessment and support, frequent evaluation and titration of therapies, application of advanced monitoring technologies and extensive interpretation of multiple  databases. Critical Care Time devoted to patient care services described in this note independent of APP/resident time (if applicable)  is 31 minutes.   Erskine Emery MD [facility] 09/30/2019 6:52 AM Personal pager: [phone] If unanswered, please page CCM On-call: [phone]

## 2019-10-01 DIAGNOSIS — R7989 Other specified abnormal findings of blood chemistry: Secondary | ICD-10-CM

## 2019-10-01 LAB — GLUCOSE, CAPILLARY
Glucose-Capillary: 108 mg/dL — ABNORMAL HIGH (ref 70–99)
Glucose-Capillary: 44 mg/dL — CL (ref 70–99)
Glucose-Capillary: 71 mg/dL (ref 70–99)
Glucose-Capillary: 79 mg/dL (ref 70–99)
Glucose-Capillary: 83 mg/dL (ref 70–99)
Glucose-Capillary: 89 mg/dL (ref 70–99)
Glucose-Capillary: 95 mg/dL (ref 70–99)
Glucose-Capillary: 96 mg/dL (ref 70–99)

## 2019-10-01 LAB — COMPREHENSIVE METABOLIC PANEL
ALT: 66 U/L — ABNORMAL HIGH (ref 0–44)
AST: 78 U/L — ABNORMAL HIGH (ref 15–41)
Albumin: 1.5 g/dL — ABNORMAL LOW (ref 3.5–5.0)
Alkaline Phosphatase: 537 U/L — ABNORMAL HIGH (ref 38–126)
Anion gap: 10 (ref 5–15)
BUN: 120 mg/dL — ABNORMAL HIGH (ref 8–23)
CO2: 34 mmol/L — ABNORMAL HIGH (ref 22–32)
Calcium: 8.1 mg/dL — ABNORMAL LOW (ref 8.9–10.3)
Chloride: 100 mmol/L (ref 98–111)
Creatinine, Ser: 1.25 mg/dL — ABNORMAL HIGH (ref 0.61–1.24)
GFR calc Af Amer: >60 mL/min (ref >60)
GFR calc non Af Amer: >60 mL/min (ref >60)
Glucose, Bld: 110 mg/dL — ABNORMAL HIGH (ref 70–99)
Potassium: 4.2 mmol/L (ref 3.5–5.1)
Sodium: 144 mmol/L (ref 135–145)
Total Bilirubin: 5.6 mg/dL — ABNORMAL HIGH (ref 0.3–1.2)
Total Protein: 6.1 g/dL — ABNORMAL LOW (ref 6.5–8.1)

## 2019-10-01 LAB — BLOOD GAS, VENOUS
Acid-Base Excess: 10.1 mmol/L — ABNORMAL HIGH (ref 0.0–2.0)
Bicarbonate: 36.5 mmol/L — ABNORMAL HIGH (ref 20.0–28.0)
Drawn by: 5856
FIO2: 40
O2 Saturation: 56.7 %
Patient temperature: 37
pCO2, Ven: 75.5 mmHg (ref 44.0–60.0)
pH, Ven: 7.305 (ref 7.250–7.430)
pO2, Ven: 33.6 mmHg (ref 32.0–45.0)

## 2019-10-01 LAB — CBC
HCT: 31.8 % — ABNORMAL LOW (ref 39.0–52.0)
Hemoglobin: 9.2 g/dL — ABNORMAL LOW (ref 13.0–17.0)
MCH: 28.6 pg (ref 26.0–34.0)
MCHC: 28.9 g/dL — ABNORMAL LOW (ref 30.0–36.0)
MCV: 98.8 fL (ref 80.0–100.0)
Platelets: 205 10*3/uL (ref 150–400)
RBC: 3.22 MIL/uL — ABNORMAL LOW (ref 4.22–5.81)
RDW: 17.9 % — ABNORMAL HIGH (ref 11.5–15.5)
WBC: 18.8 10*3/uL — ABNORMAL HIGH (ref 4.0–10.5)
nRBC: 0 % (ref 0.0–0.2)

## 2019-10-01 LAB — MAGNESIUM: Magnesium: 2.8 mg/dL — ABNORMAL HIGH (ref 1.7–2.4)

## 2019-10-01 LAB — PHOSPHORUS: Phosphorus: 5.3 mg/dL — ABNORMAL HIGH (ref 2.5–4.6)

## 2019-10-01 MED ORDER — INSULIN DETEMIR 100 UNIT/ML ~~LOC~~ SOLN
8.0000 [IU] | Freq: Two times a day (BID) | SUBCUTANEOUS | Status: DC
  Administered 2019-10-01 – 2019-10-02 (×2): 8 [IU] via SUBCUTANEOUS
  Filled 2019-10-01 (×3): qty 0.08

## 2019-10-01 MED ORDER — "THROMBI-PAD 3""X3"" EX PADS"
1.0000 | MEDICATED_PAD | Freq: Once | CUTANEOUS | Status: AC
  Administered 2019-10-02: 1 via TOPICAL
  Filled 2019-10-01 (×2): qty 1

## 2019-10-01 NOTE — Progress Notes (Signed)
NAME:  Todd [name], MRN:  [id number], DOB:  [birth year], LOS: 31 ADMISSION DATE:  07/26/2019, CONSULTATION DATE:  08/03/2019 REFERRING MD:  Roxan Hockey, CHIEF COMPLAINT:  ECMO   Brief History   64 year old male with history of ischemic cardiomyopathy admitted with CHF exacerbation, found to have multi-vessel disease requiring CABG 6/24, post op VT arrest 6/25 and cannulated for VA ECMO. S/p decannulation and impella.   Past Medical History   Past Medical History:  Diagnosis Date  . Arthritis   . CHF (congestive heart failure) ([place])   . Depression   . Diabetes mellitus without complication ([place])   . Hard of hearing   . Hyperlipidemia   . Hypertension    Significant Hospital Events   6/24 CABG 6/25 VA cannulation 6/28 decannulated and impella placed 6/29 bedside re-exploration; s/p decannulation. Placement of impella device originally at p8 but decreased to p4 2/2 suction events overnight up to p6. Echo completed and repositioned device. Remained on considerable support with 8 epi and 46 norepi vaso 0.05. continued chest tube output with large clots noted. Heparin thru device. Replacement products ongoing. 60% 8 peep 6/30 bedside mediastinal re-exploration and clean out of hematoma with tamponade and worsening hemodynamics. Pt had large volume transfusion. rebolused with amio 2/2 nsvt episode.  Weight up 48 pounds.  Started diuresis, Lasix drip started. 7/01 iatrogenic respiratory alkalosis, vent rate decreased. 7/02 No significant issues overnight remains on pressors and Impella not tolerating tube feeds with high gastric output awaiting core track placement 7/03 started trickle TF  7/05 Swab removed, CVL and PICC placed. 7/13 Trach and BAL 7/16 Awake and tracking. No follow commands. Still having fevers. Successful ultrasound and CT guided placement of a 10.2 French cholecystostomy tube. A small amount of aspirated bile was capped and sent to the laboratory for analysis 7/17 Off  sedation, PSV.  Beginning to follow some commands occasionally intermittently.  Very jaundiced.  Growing Candida in the blood and BAL > diflucan.   7/21 Ongoing fevers, WBC increased 7/22 Persistent fevers, pan cultured with CT Chest/Head. Suspected source tracheobronchitis.  7/28 Transitioned to eliquis, jaundiced but labs improving  7/29 Agitation/restless.  Trazodone added QHS. Weaning midodrine, attempting diuresis  7/30 Increased Na, weaning on PSV.  8/01 On 40% ATC, significantly improved mental status 8/1>> ongoing attempts at trach weaning complicated by weakness, secretion management, and anxiety 8/8 started back on meropenem and milrinone 8/12 patient making slow improvements, he has been able to tolerate short course ATC 8/13 Weaned off milrinone 8/14 Code blue called for hypoxemia leading to PEA arrest. ROSC achieved after 6 min. 8/15: In  morning patient is unfazed from his recent events and continues asking for water to drink. Awake, alert and follows commands. 8/16 seen by surg per wound care recs-->"Do not recommend any acute surgical intervention. Will ultimately defer wound care recommendations to the wound care team, but I think it is reasonable to decrease hydrotherapy to 3x per week. I would increase dressing changes to BID and continue santyl for enzymatic debridement". 8/17: Using albumin and Lasix to try to mobilize anasarca 8/18: Tracheostomy downsized to 7.5.  Attempting short trials of aerosol trach collar 8/19-8/20 Now in a negative fluid balance.  Sodium continuing to rise.  Holding diuretics continuing to work on physical therapy and reconditioning, got a unit of blood in am for hgb 7.3. F/U hgb was 6.8, had scant amt of bloody tracheal out-put but not enough to account for hgb loss. No frank blood in  stools. Got 2 more units of PRBCs. Hgb now 9.7. w/out obvious source. Complaining of more pain than last few days. More restless. Stopped arixtra again. Added fent patch  for pain needs.   Consults:  Advanced Heart Failure PCCM TCTS  Procedures:  LUE PICC 7/5 >>7/21 R Brachial ALine 7/9 >>7/27 Chest Tubes >> out L IJ CVC 7/21 >>7/27 Peg tube 7/27 >> Tracheostomy downsized to 7.5 on 8/18  Significant Diagnostic Tests:  6/22 spirometry with restrictive physiology, preserved FEV1/FVC 6/18 echo: LVEF 08-65%, grade I diastolic dysfunction 7/84 US Abdomen cholelithiasis without cholecystitis. Mild wall thinking in setting of liver disease and ascites.  7/10 LE [place] negative for DVT  7/20 CT ABD w contrast >> stable position of the percutaneous cholecystostomy tube with complete decompression of the gallbladder, trace residual free fluid within the RUQ, decreased since prior, trace bilateral pleural effusions L>R, scattered areas of ground glass airspace disease within the RML, RLL 7/22 CT Chest w/o Contrast >> Retrosternal fluid collection, non specific.  Bilateral pleural effusions, portions loculated.   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 Stable bilateral mastoid and middle ear effusions.   Micro Data:  7/3 RQ> Few candida tropicalis, likely contaminant 7/12 BAL Cx> Candida tropicalis and albicans, likely contaminant 7/14 BCx >> candida parapsilosis 7/17 BCx >> neg resp 8/7 >> stenotrophomonas  Antimicrobials:  Meropenem 8/8 >>8/10 Diflucan  7/16 >> 8/9 vanc 7/22 >>8/10 levaquin 8/10 >> 8/23  Interim history/subjective:  No acute overnight events; denies any pain at this time.   Objective   Blood pressure (!) 116/56, pulse 65, temperature 98.3 F (36.8 C), temperature source Oral, resp. rate (!) 26, height _0  (1.753 m), weight 74.5 kg, SpO2 95 %.    Vent Mode: PRVC FiO2 (%):  [40 %] 40 % Set Rate:  [20 bmp] 20 bmp Vt Set:  [420 mL] 420 mL PEEP:  [5 cmH20] 5 cmH20 Pressure Support:  [12 cmH20] 12 cmH20 Plateau Pressure:  [14 cmH20-23 cmH20] 21 cmH20    Intake/Output Summary (Last 24 hours) at 10/01/2019 6962 Last data filed at 10/01/2019 0600 Gross per 24 hour  Intake 2129.94 ml  Output 2045 ml  Net 84.94 ml   Filed Weights   09/29/19 0500 09/30/19 0500 10/01/19 0500  Weight: 74.9 kg 74.6 kg 74.5 kg    Examination: General: frail elderly male, jaundiced; on mechanical ventilation HENT: [place]/AT, tracheostomy site clean/dry/intact; dry MM, anicteric sclerae  Lungs: Diffuse rhonchi  Cardiovascular: RRR, S1 and S2 present, no m/r/g Abdomen: soft, nondistended; + bowel sounds; RUQ drain in place with bilious output Extremities: cachexia; scattered echymosis; warm and dry  Neuro: awake, following commands appropriately   Resolved Hospital Problem list   Suspected HCAP Acute kidney injury Hypernatremia Acute Metabolic Encephalopathy  -CTH without acute intracranial pathology. EEG negative 7/23.  Shock Heparin-induced thrombocytopenia >> resolved, required bivalirudin 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 -Opthalmology evaluated , diflucan x 3 weeks PEA arrest 8/14 preceded by hypoxemia Assessment & Plan:  Acute hypoxic respiratory failure requiring mechanical ventilation due to stenotrophomonas HCAP and severe weakness/deconditioning Tracheostomy dependence  - Completed 14 day course of levaquin today  - PS trials as able; historically has had difficulty with mucus plugging   Cardiogenic/hemorrhagic shock s/p CABG complicated by VT arrest S/p VA ECMO (decannulated 6/28), s/p impella placement (6/28>7/9) Atrial flutter, reverted to NSR - Continue to monitor   Hypernatremia - Currently at goal  Hyperbilirubinemia 2/2 acalculous  cholecystitis vs drug induced cholestasis S/p percutaneous GB drain by IR on 7/17 - Trending intermittent LFTs   Diabetes mellitus II highly sensitive to insulin - Multiple episodes of hypoglycemia for which basal insulin adjusted to 8U bid - Continue SSI and Novolog q4h  Acute blood  loss anemia and 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 Electrolytes currently stable. Will consider nephrology consult to assess for need for iHD.  - Continue to trend renal function    Best practice:  Diet: tube feeds Pain/Anxiety/Delirium protocol (if indicated): per protocol VAP protocol (if indicated): per protocol DVT prophylaxis: Arixtra  GI prophylaxis: PPI Glucose control: Levemir + Novolog + SSI Mobility: PT/OT as able  Code Status: FULL Family Communication: per primary Disposition: ICU - awaiting LTACH placement  Critical care time: 40 minutes   Harvie Heck, MD Internal Medicine, PGY-2 10/01/19 4:28 PM Pager # [phone]

## 2019-10-01 NOTE — Consult Note (Signed)
WOC Nurse wound follow up Patient receiving care in [facility] 2H02. Assisted with turning for sacral wound assessment by NT. Wound type: stage 4 Measurement: Please see PT's hydrotherapy notes from today's treatment once rendered. Wound bed: Drainage (amount, consistency, odor)  Periwound: Dressing procedure/placement/frequency: Continue the current plan of MWF hydrotherapy and twice daily application of enzymatic debriding agent, Santyl. Helmut Muster, RN, MSN, CWOCN, CNS-BC, pager [phone]

## 2019-10-01 NOTE — Progress Notes (Signed)
      [address]       [place] [address]             [phone]      Stable day  No new issues Hydrotherapy for stage IV decubitus ulcer  Viviann Spare C. Dorris Fetch, MD [facility] [phone]

## 2019-10-01 NOTE — Progress Notes (Signed)
Physical Therapy Wound Treatment Patient Details  Name: AUTHOR HATLESTAD MRN: [id number] Date of Birth: 1956/02/08  Today's Date: 10/01/2019 Time: 1028-1107 Time Calculation (min): 39 min  Subjective  Subjective: pt only opened his eyes a couple of times today  Patient and Family Stated Goals: unable  Date of Onset:  (unknown) Prior Treatments: foam dressing  Pain Score:  Fentanyl patch in place, minimally responsive today   Wound Assessment  Pressure Injury 08/09/19 Sacrum Stage 4 - Full thickness tissue loss with exposed bone, tendon or muscle. has evolved into stage 4 when assessed on 8/9 (Active)  Wound Image   09/26/19 1500  Dressing Type ABD;Gauze (Comment);Barrier Film (skin prep);Normal saline moist dressing;Other (Comment) 10/01/19 1500  Dressing Changed 10/01/19 1500  Dressing Change Frequency Twice a day 10/01/19 1500  State of Healing Non-healing 10/01/19 1500  Site / Wound Assessment Bleeding;Painful;Red;Brown;Dusky;Friable 10/01/19 1500  % Wound base Red or Granulating 5% 10/01/19 1500  % Wound base Yellow/Fibrinous Exudate 30% 10/01/19 1500  % Wound base Black/Eschar 55% 10/01/19 1500  % Wound base Other/Granulation Tissue (Comment) 5% 10/01/19 1500  Peri-wound Assessment Erythema (blanchable);Denuded;Maceration 10/01/19 1500  Wound Length (cm) 6.4 cm 09/26/19 1500  Wound Width (cm) 5.2 cm 09/26/19 1500  Wound Depth (cm) 2.3 cm 09/26/19 1500  Wound Surface Area (cm^2) 33.28 cm^2 09/26/19 1500  Wound Volume (cm^3) 76.54 cm^3 09/26/19 1500  Tunneling (cm) 7 cm at 5 o'clock 09/26/19 1500  Undermining (cm) 1.2-2.2 cm around entire wound 09/26/19 1500  Margins Unattached edges (unapproximated) 10/01/19 1500  Drainage Amount Moderate 10/01/19 1500  Drainage Description Serosanguineous 10/01/19 1500  Treatment Debridement (Selective);Hydrotherapy (Pulse lavage);Packing (Saline gauze) 10/01/19 1500   Santyl applied to necrotic tissue   Hydrotherapy Pulsed lavage therapy -  wound location: sacrum Pulsed Lavage with Suction (psi): 12 psi Pulsed Lavage with Suction - Normal Saline Used: 1000 mL Pulsed Lavage Tip: Tip with splash shield Selective Debridement Selective Debridement - Location: sacrum Selective Debridement - Tools Used: Forceps;Scissors Selective Debridement - Tissue Removed: yellow/gray slough   Wound Assessment and Plan  Wound Therapy - Assess/Plan/Recommendations Wound Therapy - Clinical Statement: Wound is very red/black in the inferior portion of the wound. Pt appears to have some diffuse clotting in this area. Limited selective debridement to inferior portion of wound so as not to cause additional bleeding. Applied good layer of Santyl to provide enzymatic debridement in the area. Extent of wound continues to increase. Pt continues to have slowed progression of wound in presence of pulse lavage and debridement.  Wound Therapy - Functional Problem List: decreased mobility Factors Delaying/Impairing Wound Healing: Diabetes Mellitus;Incontinence;Immobility;Multiple medical problems Hydrotherapy Plan: Debridement;Dressing change;Patient/family education;Pulsatile lavage with suction Wound Therapy - Frequency: 3X / week Wound Therapy - Follow Up Recommendations: Other (comment) (LTACH) Wound Plan: see above  Wound Therapy Goals- Improve the function of patient's integumentary system by progressing the wound(s) through the phases of wound healing (inflammation - proliferation - remodeling) by: Decrease Necrotic Tissue to: 60 Decrease Necrotic Tissue - Progress: Not progressing Increase Granulation Tissue to: 40 Increase Granulation Tissue - Progress: Mot progressing Goals/treatment plan/discharge plan were made with and agreed upon by patient/family: No, Patient unable to participate in goals/treatment/discharge plan and family unavailable Time For Goal Achievement: 7 days Wound Therapy - Potential for Goals: Poor  Goals will be updated until  maximal potential achieved or discharge criteria met.  Discharge criteria: when goals achieved, discharge from hospital, MD decision/surgical intervention, no progress towards goals, refusal/missing three consecutive treatments without notification  or medical reason.  GP    Dani Gobble. Migdalia Dk PT, DPT Acute Rehabilitation Services Pager [phone] Office [phone]  [place] 10/01/2019, 4:49 PM

## 2019-10-01 NOTE — Progress Notes (Signed)
45 Days Post-Op Procedure(s) (LRB): REMOVAL OF IMPELLA LEFT VENTRICULAR ASSIST DEVICE (N/A) TRANSESOPHAGEAL ECHOCARDIOGRAM (TEE) (N/A) Subjective: Alert. Denies nausea and pain  Objective: Vital signs in last 24 hours: Temp:  [98.1 F (36.7 C)-99.8 F (37.7 C)] 98.3 F (36.8 C) (08/23 0728) Pulse Rate:  [56-78] 65 (08/23 0727) Cardiac Rhythm: Normal sinus rhythm;Heart block (08/22 2000) Resp:  [9-26] 26 (08/23 0727) BP: (94-143)/(44-69) 116/56 (08/23 0727) SpO2:  [91 %-100 %] 96 % (08/23 0731) FiO2 (%):  [40 %] 40 % (08/23 0731) Weight:  [74.5 kg] 74.5 kg (08/23 0500)  Hemodynamic parameters for last 24 hours:    Intake/Output from previous day: 08/22 0701 - 08/23 0700 In: 2699.9 [NG/GT:2400; IV Piggyback:149.9] Out: 2295 [Urine:1520; Drains:575; Stool:200] Intake/Output this shift: No intake/output data recorded.  General appearance: alert, cooperative and no distress Neurologic: intact Heart: regular rate and rhythm Lungs: clear to auscultation bilaterally Abdomen: normal findings: soft, non-tender  Lab Results: Recent Labs    09/30/19 0351 10/01/19 0336  WBC 16.3* 18.8*  HGB 9.6* 9.2*  HCT 32.7* 31.8*  PLT 236 205   BMET:  Recent Labs    09/30/19 0351 10/01/19 0336  NA 149* 144  K 4.5 4.2  CL 106 100  CO2 35* 34*  GLUCOSE 128* 110*  BUN 114* 120*  CREATININE 1.15 1.25*  CALCIUM 8.3* 8.1*    PT/INR: No results for input(s): LABPROT, INR in the last 72 hours. ABG    Component Value Date/Time   PHART 7.366 09/22/2019 2147   HCO3 39.3 (H) 09/22/2019 2147   TCO2 41 (H) 09/22/2019 2147   ACIDBASEDEF 1.0 08/08/2019 1115   O2SAT 68.5 09/27/2019 0505   CBG (last 3)  Recent Labs    10/01/19 0022 10/01/19 0314 10/01/19 0725  GLUCAP 96 108* 83    Assessment/Plan: S/P Procedure(s) (LRB): REMOVAL OF IMPELLA LEFT VENTRICULAR ASSIST DEVICE (N/A) TRANSESOPHAGEAL ECHOCARDIOGRAM (TEE) (N/A) ID- has nearly completed 2 weeks of Levaquin. Afebrile but  does have persistent leukocytosis  CV- stable in SR  Check BNP RESP- VDRF, has not done well with TC. Weaning PS, plane per CCM RENAL- hypernatremia improved, mildly elevated creatinine GI- emesis last night, TF resumed, denies nausea, abdominal exam benign HEME- anemia improved post transfusion on 8/19, Hgb stable NEURO- alert. Intact. PT/OT/ Speech ENDO- CBG normal Deconditioning- severe   LOS: 67 days    [name] 10/01/2019

## 2019-10-01 NOTE — TOC Progression Note (Signed)
Transition of Care [facility]) - Progression Note    Patient Details  Name: Todd [name] MRN: [id number] Date of Birth: 1955-08-17  Transition of Care [facility]) CM/SW Contact  Terrial Rhodes, [place] Phone Number: 10/01/2019, 1:56 PM  Clinical Narrative:     Irving Burton with [facility] is waiting to hear to hear back from [facility] for possible insurance approval for patient.  Irving Burton is hopeful that we may hear back from [facility] today with determination.  CSW will continue to folow.  Expected Discharge Plan: Long Term Acute Care (LTAC) Barriers to Discharge: Insurance Authorization  Expected Discharge Plan and Services Expected Discharge Plan: Long Term Acute Care (LTAC)   Discharge Planning Services: CM Consult Post Acute Care Choice: Long Term Acute Care (LTAC) Living arrangements for the past 2 months: Single Family Home                                       Social Determinants of Health (SDOH) Interventions    Readmission Risk Interventions Readmission Risk Prevention Plan 09/03/2019  Transportation Screening Complete  PCP or Specialist Appt within 3-5 Days Complete  HRI or Home Care Consult Complete  Social Work Consult for Recovery Care Planning/Counseling Complete  Palliative Care Screening Complete  Medication Review Oceanographer) Complete  Some recent data might be hidden

## 2019-10-01 NOTE — Progress Notes (Signed)
eLink Physician-Brief Progress Note Patient Name: Todd [name] DOB: [birth year] MRN: [id number]   Date of Service  10/01/2019  HPI/Events of Note  Thrombi pad dressing requested for HD cath oozing  eICU Interventions  Ordered     Intervention Category Major Interventions: Hemorrhage - evaluation and management  Oretha Milch 10/01/2019, 9:24 PM

## 2019-10-01 NOTE — Progress Notes (Signed)
Pt was weaning on 8/5 PSV/CPAP when RT was called to room due to low saturations. RT entered room and pt was back on full support with saturations in the 80s. RT lavaged and suctioned pt and was able to get a medium sized mucus plug out. Pt saturations back in the 90s at this time. RT will continue to monitor.

## 2019-10-01 NOTE — Progress Notes (Signed)
Inpatient Diabetes Program Recommendations  AACE/ADA: New Consensus Statement on Inpatient Glycemic Control (2015)  Target Ranges:  Prepandial:   less than 140 mg/dL      Peak postprandial:   less than 180 mg/dL (1-2 hours)      Critically ill patients:  140 - 180 mg/dL   Lab Results  Component Value Date   GLUCAP 83 10/01/2019   HGBA1C 5.3 09/25/2019    Review of Glycemic Control Results for Todd [name], Todd [name] (MRN [id number]) as of 10/01/2019 10:51  Ref. Range 09/30/2019 19:20 09/30/2019 23:44 10/01/2019 00:22 10/01/2019 03:14 10/01/2019 07:25  Glucose-Capillary Latest Ref Range: 70 - 99 mg/dL 76 63 (L) 96 791 (H) 83   Diabetes history: DM 2 Outpatient Diabetes medications:  Actos 45 mg daily, Metaglip 2.5-500 mg bid, Farxiga 10 mg daily Current orders for Inpatient glycemic control:  Novolog very sensitive (0-6 units) q 4 hours, Novolog 2 units q 4 hours, Levemir 14 units bid Inpatient Diabetes Program Recommendations:    Please consider reducing Levemir to 8 units bid.   Thanks  Beryl Meager, RN, BC-ADM Inpatient Diabetes Coordinator Pager [phone] (8a-5p)

## 2019-10-02 ENCOUNTER — Inpatient Hospital Stay (HOSPITAL_COMMUNITY): Payer: Medicare Other

## 2019-10-02 DIAGNOSIS — J9602 Acute respiratory failure with hypercapnia: Secondary | ICD-10-CM

## 2019-10-02 LAB — COMPREHENSIVE METABOLIC PANEL
ALT: 61 U/L — ABNORMAL HIGH (ref 0–44)
AST: 79 U/L — ABNORMAL HIGH (ref 15–41)
Albumin: 1.5 g/dL — ABNORMAL LOW (ref 3.5–5.0)
Alkaline Phosphatase: 511 U/L — ABNORMAL HIGH (ref 38–126)
Anion gap: 7 (ref 5–15)
BUN: 121 mg/dL — ABNORMAL HIGH (ref 8–23)
CO2: 35 mmol/L — ABNORMAL HIGH (ref 22–32)
Calcium: 7.9 mg/dL — ABNORMAL LOW (ref 8.9–10.3)
Chloride: 97 mmol/L — ABNORMAL LOW (ref 98–111)
Creatinine, Ser: 1.13 mg/dL (ref 0.61–1.24)
GFR calc Af Amer: >60 mL/min (ref >60)
GFR calc non Af Amer: >60 mL/min (ref >60)
Glucose, Bld: 87 mg/dL (ref 70–99)
Potassium: 3.9 mmol/L (ref 3.5–5.1)
Sodium: 139 mmol/L (ref 135–145)
Total Bilirubin: 5 mg/dL — ABNORMAL HIGH (ref 0.3–1.2)
Total Protein: 5.7 g/dL — ABNORMAL LOW (ref 6.5–8.1)

## 2019-10-02 LAB — BLOOD GAS, VENOUS
Acid-Base Excess: 10.6 mmol/L — ABNORMAL HIGH (ref 0.0–2.0)
Bicarbonate: 36.7 mmol/L — ABNORMAL HIGH (ref 20.0–28.0)
Drawn by: 5044
FIO2: 40
O2 Saturation: 61.9 %
Patient temperature: 36.7
pCO2, Ven: 69.6 mmHg — ABNORMAL HIGH (ref 44.0–60.0)
pH, Ven: 7.339 (ref 7.250–7.430)
pO2, Ven: 33.3 mmHg (ref 32.0–45.0)

## 2019-10-02 LAB — GLUCOSE, CAPILLARY
Glucose-Capillary: 143 mg/dL — ABNORMAL HIGH (ref 70–99)
Glucose-Capillary: 186 mg/dL — ABNORMAL HIGH (ref 70–99)
Glucose-Capillary: 192 mg/dL — ABNORMAL HIGH (ref 70–99)
Glucose-Capillary: 217 mg/dL — ABNORMAL HIGH (ref 70–99)
Glucose-Capillary: 80 mg/dL (ref 70–99)
Glucose-Capillary: 82 mg/dL (ref 70–99)

## 2019-10-02 LAB — CBC
HCT: 28.4 % — ABNORMAL LOW (ref 39.0–52.0)
Hemoglobin: 8.4 g/dL — ABNORMAL LOW (ref 13.0–17.0)
MCH: 28.9 pg (ref 26.0–34.0)
MCHC: 29.6 g/dL — ABNORMAL LOW (ref 30.0–36.0)
MCV: 97.6 fL (ref 80.0–100.0)
Platelets: 182 10*3/uL (ref 150–400)
RBC: 2.91 MIL/uL — ABNORMAL LOW (ref 4.22–5.81)
RDW: 17.9 % — ABNORMAL HIGH (ref 11.5–15.5)
WBC: 11.8 10*3/uL — ABNORMAL HIGH (ref 4.0–10.5)
nRBC: 0 % (ref 0.0–0.2)

## 2019-10-02 LAB — MAGNESIUM: Magnesium: 2.9 mg/dL — ABNORMAL HIGH (ref 1.7–2.4)

## 2019-10-02 LAB — BRAIN NATRIURETIC PEPTIDE: B Natriuretic Peptide: 1962.5 pg/mL — ABNORMAL HIGH (ref 0.0–100.0)

## 2019-10-02 LAB — PHOSPHORUS: Phosphorus: 4.8 mg/dL — ABNORMAL HIGH (ref 2.5–4.6)

## 2019-10-02 MED ORDER — CLONAZEPAM 0.5 MG PO TABS
0.5000 mg | ORAL_TABLET | Freq: Two times a day (BID) | ORAL | Status: DC
  Administered 2019-10-02 – 2019-10-09 (×14): 0.5 mg
  Filled 2019-10-02 (×14): qty 1

## 2019-10-02 MED ORDER — FUROSEMIDE 10 MG/ML IJ SOLN
40.0000 mg | Freq: Every day | INTRAMUSCULAR | Status: DC
  Administered 2019-10-02 – 2019-10-04 (×3): 40 mg via INTRAVENOUS
  Filled 2019-10-02 (×3): qty 4

## 2019-10-02 MED ORDER — INSULIN DETEMIR 100 UNIT/ML ~~LOC~~ SOLN
5.0000 [IU] | Freq: Two times a day (BID) | SUBCUTANEOUS | Status: DC
  Administered 2019-10-02 – 2019-10-04 (×4): 5 [IU] via SUBCUTANEOUS
  Filled 2019-10-02 (×5): qty 0.05

## 2019-10-02 NOTE — Progress Notes (Signed)
Nutrition Follow-up  DOCUMENTATION CODES:   Non-severe (moderate) malnutrition in context of acute illness/injury  INTERVENTION:   Tube Feeding via PEG: -Pivot 1.5 to 70 ml/hr Provides 158 g of protein, 2520 kcals, 1278 mL of free water  Continue Juven BID, each packet provides 80 calories, 8 grams of carbohydrate, 2.5 grams of protein (collagen), 7 grams of L-arginine and 7 grams of L-glutamine; supplement contains CaHMB, Vitamins C, E, B12 and Zinc to promote wound healing  NUTRITION DIAGNOSIS:   Moderate Malnutrition related to acute illness as evidenced by mild fat depletion, mild muscle depletion, moderate muscle depletion.  Being addressed via TF   GOAL:   Patient will meet greater than or equal to 90% of their needs  Progressing   MONITOR:   Vent status, TF tolerance, Labs, Weight trends  REASON FOR ASSESSMENT:   Consult  (EMCO tube feeding recommendations)  ASSESSMENT:   Patient with PMH significant for CHF, COPD, HTN, DM, HLD, MDD, and BPH. Presents this admission with CHF exacerbation.  6/24- s/p CABG x4 6/25- VT arrest, emergent sternotomy, VA ECMO cannulation 6/28- De-cannulated, Impella placed 6/29-Bedside Mediastinal Exploration 2/2 tamponade with multiple blood clots removed 7/02 OR for sternal closure with application of wound vac, Cortrak placed but post-pyloric placement unsuccessful 7/03 TF stared at 20 ml/hr but pt with emesis, OG to LWS with 850 mL out 7/04 TPN started at 30 ml/hr 7/05 TPN remains at 30 ml/hr 7/06 TPN at 55 ml/hr, IR placed 10 fr post-pyloric feeding tube. Trickle TF initiated 7/07 TF titration, TPN discontinued 7/09 HIT positive, bivalirudin started 7/09 Impella removed 7/12 Trach placed 7/16 Cholecystostomy tube 7/27 PEG tube 8/14 PEA arrest  Awaiting decision regarding discharge  Pt continues with hydrotherapy 3x a week for stage IV sacral ulcer  Tolerating Pivot 1.5 at 70 ml/hr via PEG tube. Juven BID  Net  negative 9 L per I/O flow sheet  Labs: BUN 121, Creatinine wdl Meds: ss novolog, levemir, lasix   Diet Order:   Diet Order            Diet NPO time specified  Diet effective midnight                 EDUCATION NEEDS:   Education needs have been addressed  Skin:  Skin Assessment: Skin Integrity Issues: Skin Integrity Issues:: Stage II DTI: n/a Stage II: L. Nare under NG tube Unstageable: Sacrum (DTI evolved to [facility] RN following Incisions: R leg, R groin Other: open chest  Last BM:  8/24 rectal tube  Height:   Ht Readings from Last 1 Encounters:  08/27/19 5\' 9"  (1.753 m)    Weight:   Wt Readings from Last 1 Encounters:  10/02/19 76 kg    BMI:  Body mass index is 24.74 kg/m.  Estimated Nutritional Needs:   Kcal:  10/04/19 kcals  Protein:  130-160 g  Fluid:  >/= 1.8 L/day   [phone] MS, RDN, LDN, CNSC Registered Dietitian III Clinical Nutrition RD Pager and On-Call Pager Number Located in [place]

## 2019-10-02 NOTE — TOC Progression Note (Addendum)
Transition of Care [facility]) - Progression Note    Patient Details  Name: Todd [name] MRN: [id number] Date of Birth: Apr 03, 1955  Transition of Care [facility]) CM/SW Contact  Terrial Rhodes, LCSWA Phone Number: 10/02/2019, 1:16 PM  Clinical Narrative:     CSW received call from [facility] about possible LTAC placement for patient. The medical director is requesting a peer to peer with MD prior to determination. Peer to peer must be completed by 4:30pm. CSW chatted MD about Peer to Peer. MD will complete peer to peer today.  Pending bed offer with [facility].   CSW will continue to follow.  Expected Discharge Plan: Long Term Acute Care (LTAC) Barriers to Discharge: Insurance Authorization  Expected Discharge Plan and Services Expected Discharge Plan: Long Term Acute Care (LTAC)   Discharge Planning Services: CM Consult Post Acute Care Choice: Long Term Acute Care (LTAC) Living arrangements for the past 2 months: Single Family Home                                       Social Determinants of Health (SDOH) Interventions    Readmission Risk Interventions Readmission Risk Prevention Plan 09/03/2019  Transportation Screening Complete  PCP or Specialist Appt within 3-5 Days Complete  HRI or Home Care Consult Complete  Social Work Consult for Recovery Care Planning/Counseling Complete  Palliative Care Screening Complete  Medication Review Oceanographer) Complete  Some recent data might be hidden

## 2019-10-02 NOTE — Progress Notes (Signed)
NAME:  Todd [name], MRN:  [id number], DOB:  1955/10/16, LOS: 49 ADMISSION DATE:  07/26/2019, CONSULTATION DATE:  08/03/2019 REFERRING MD:  Roxan Hockey, CHIEF COMPLAINT:  ECMO   Brief History   64 year old male with history of ischemic cardiomyopathy admitted with CHF exacerbation, found to have multi-vessel disease requiring CABG 6/24, post op VT arrest 6/25 and cannulated for VA ECMO. S/p decannulation and impella.   Past Medical History   Past Medical History:  Diagnosis Date  . Arthritis   . CHF (congestive heart failure) ([place])   . Depression   . Diabetes mellitus without complication ([place])   . Hard of hearing   . Hyperlipidemia   . Hypertension    Significant Hospital Events   6/24 CABG 6/25 VA cannulation 6/28 decannulated and impella placed 6/29 bedside re-exploration; s/p decannulation. Placement of impella device originally at p8 but decreased to p4 2/2 suction events overnight up to p6. Echo completed and repositioned device. Remained on considerable support with 8 epi and 46 norepi vaso 0.05. continued chest tube output with large clots noted. Heparin thru device. Replacement products ongoing. 60% 8 peep 6/30 bedside mediastinal re-exploration and clean out of hematoma with tamponade and worsening hemodynamics. Pt had large volume transfusion. rebolused with amio 2/2 nsvt episode.  Weight up 48 pounds.  Started diuresis, Lasix drip started. 7/01 iatrogenic respiratory alkalosis, vent rate decreased. 7/02 No significant issues overnight remains on pressors and Impella not tolerating tube feeds with high gastric output awaiting core track placement 7/03 started trickle TF  7/05 Swab removed, CVL and PICC placed. 7/13 Trach and BAL 7/16 Awake and tracking. No follow commands. Still having fevers. Successful ultrasound and CT guided placement of a 10.2 French cholecystostomy tube. A small amount of aspirated bile was capped and sent to the laboratory for analysis 7/17 Off  sedation, PSV.  Beginning to follow some commands occasionally intermittently.  Very jaundiced.  Growing Candida in the blood and BAL > diflucan.   7/21 Ongoing fevers, WBC increased 7/22 Persistent fevers, pan cultured with CT Chest/Head. Suspected source tracheobronchitis.  7/28 Transitioned to eliquis, jaundiced but labs improving  7/29 Agitation/restless.  Trazodone added QHS. Weaning midodrine, attempting diuresis  7/30 Increased Na, weaning on PSV.  8/01 On 40% ATC, significantly improved mental status 8/1>> ongoing attempts at trach weaning complicated by weakness, secretion management, and anxiety 8/8 started back on meropenem and milrinone 8/12 patient making slow improvements, he has been able to tolerate short course ATC 8/13 Weaned off milrinone 8/14 Code blue called for hypoxemia leading to PEA arrest. ROSC achieved after 6 min. 8/15: In  morning patient is unfazed from his recent events and continues asking for water to drink. Awake, alert and follows commands. 8/16 seen by surg per wound care recs-->"Do not recommend any acute surgical intervention. Will ultimately defer wound care recommendations to the wound care team, but I think it is reasonable to decrease hydrotherapy to 3x per week. I would increase dressing changes to BID and continue santyl for enzymatic debridement". 8/17: Using albumin and Lasix to try to mobilize anasarca 8/18: Tracheostomy downsized to 7.5.  Attempting short trials of aerosol trach collar 8/19-8/20 Now in a negative fluid balance.  Sodium continuing to rise.  Holding diuretics continuing to work on physical therapy and reconditioning, got a unit of blood in am for hgb 7.3. F/U hgb was 6.8, had scant amt of bloody tracheal out-put but not enough to account for hgb loss. No frank blood in  stools. Got 2 more units of PRBCs. Hgb now 9.7. w/out obvious source. Complaining of more pain than last few days. More restless. Stopped arixtra again. Added fent patch  for pain needs.   Consults:  Advanced Heart Failure PCCM TCTS  Procedures:  LUE PICC 7/5 >>7/21 R Brachial ALine 7/9 >>7/27 Chest Tubes >> out L IJ CVC 7/21 >>7/27 Peg tube 7/27 >> Tracheostomy downsized to 7.5 on 8/18  Significant Diagnostic Tests:  6/22 spirometry with restrictive physiology, preserved FEV1/FVC 6/18 echo: LVEF 35-45%, grade I diastolic dysfunction 6/25 US Abdomen cholelithiasis without cholecystitis. Mild wall thinking in setting of liver disease and ascites.  7/10 LE [place] negative for DVT  7/20 CT ABD w contrast >> stable position of the percutaneous cholecystostomy tube with complete decompression of the gallbladder, trace residual free fluid within the RUQ, decreased since prior, trace bilateral pleural effusions L>R, scattered areas of ground glass airspace disease within the RML, RLL 7/22 CT Chest w/o Contrast >> Retrosternal fluid collection, non specific.  Bilateral pleural effusions, portions loculated.   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 Stable bilateral mastoid and middle ear effusions.   Micro Data:  7/3 RQ> Few candida tropicalis, likely contaminant 7/12 BAL Cx> Candida tropicalis and albicans, likely contaminant 7/14 BCx >> candida parapsilosis 7/17 BCx >> neg resp 8/7 >> stenotrophomonas  Antimicrobials:  Meropenem 8/8 >>8/10 Diflucan  7/16 >> 8/9 vanc 7/22 >>8/10 levaquin 8/10 >> 8/23  Interim history/subjective:  No acute overnight events; denies any pain at this time.   Objective   Blood pressure 106/61, pulse 65, temperature 98 F (36.7 C), temperature source Oral, resp. rate 17, height _0  (1.753 m), weight 76 kg, SpO2 96 %.    Vent Mode: PRVC FiO2 (%):  [40 %] 40 % Set Rate:  [20 bmp] 20 bmp Vt Set:  [420 mL] 420 mL PEEP:  [5 cmH20] 5 cmH20 Pressure Support:  [12 cmH20] 12 cmH20 Plateau Pressure:  [21 cmH20-24 cmH20] 22 cmH20   Intake/Output  Summary (Last 24 hours) at 10/02/2019 6389 Last data filed at 10/02/2019 0700 Gross per 24 hour  Intake 1900 ml  Output 2085 ml  Net -185 ml   Filed Weights   09/30/19 0500 10/01/19 0500 10/02/19 0500  Weight: 74.6 kg 74.5 kg 76 kg    Examination: General: frail elderly male, slightly jaundiced; on PSV HENT: [place]/AT, tracheostomy site clean/dry/intact; dry MM, anicteric sclerae  Lungs: Diffuse rhonchi on anterior auscultation  Cardiovascular: RRR, S1 and S2 present, no m/r/g Abdomen: soft, nondistended; + bowel sounds; RUQ drain in place with bloody output  Extremities: cachexia; scattered echymosis; ischemic right index finger; warm and dry  Neuro: awake and alert, following commands appropriately  Resolved Hospital Problem list   Suspected HCAP Acute kidney injury Hypernatremia Acute Metabolic Encephalopathy  -CTH without acute intracranial pathology. EEG negative 7/23.  Shock Heparin-induced thrombocytopenia >> resolved, required bivalirudin 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 -Opthalmology evaluated , diflucan x 3 weeks PEA arrest 8/14 preceded by hypoxemia Assessment & Plan:  Acute hypoxic respiratory failure requiring mechanical ventilation due to stenotrophomonas HCAP and severe weakness/deconditioning Tracheostomy dependence  - Completed 14 day course of levaquin - Continue full vent support with weaning trials as able - Titrate FiO2 as able to maintain SpO2 >90% - Routine trach care - VAP prevention and chest physiotherapy to help with mucous plugging   Hypervolemia with pulmonary congestion Elevated BNP and CXR with worsening  pulmonary edema noted. Diffuse rales on examination and significant bilateral pitting edema in lower extremities - Lasix 57m daily  - Strict I&O - Monitor renal function   Cardiogenic/hemorrhagic shock s/p CABG complicated by VT arrest S/p VA ECMO (decannulated 6/28), s/p impella placement (6/28>7/9) Atrial flutter, reverted  to NSR - Continue amiodarone  Hypernatremia Currently at goal; will discontinue free water supplement at this time given significant hypervolemia on exam - Continue to monitor   Hyperbilirubinemia 2/2 acalculous cholecystitis vs drug induced cholestasis S/p percutaneous GB drain by IR on 7/17 - Trending intermittent LFTs  - GB drain to stay in place 6-8 weeks   Diabetes mellitus II highly sensitive to insulin CBGs in 70's. Will decrease levemir to 5u bid and hold tube feed novolog. 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  - Will continue to trend Hb; transfuse as needed for Hb <7 - Unclear source of blood loss at this time  - Will hold arixtra for now   Acute renal injury:  Worsening BUN; at high risk for uremic complications. Electrolytes currently stable. - Continue to trend renal function    Best practice:  Diet: tube feeds Pain/Anxiety/Delirium protocol (if indicated): per protocol VAP protocol (if indicated): per protocol DVT prophylaxis: Arixtra  GI prophylaxis: PPI Glucose control: Levemir + Novolog + SSI Mobility: PT/OT as able  Code Status: FULL Family Communication: per primary Disposition: ICU - awaiting LTACH placement  Critical care time: 40 minutes   SHarvie Heck MD Internal Medicine, PGY-2 10/02/19 7:21 AM Pager # [phone]

## 2019-10-02 NOTE — Progress Notes (Signed)
Physical Therapy Treatment Patient Details Name: MONTGOMERY FAVOR MRN: [id number] DOB: 09/16/1955 Today's Date: 10/02/2019    History of Present Illness Todd [name] is a 64 y.o. male presenting with shortness of breath; noted AKI, CHF, recent PNA, elevated troponin, pulm edema/effusion, and cardiomegaly.Marland Kitchen CABG 6/24 and intubated (extubated 6/25)--went into cardiac arrest on same date with re-opening of chest for heart massage and direct epicardial paddles and place on ECMO. 08/03/19 washout out for tamponade. 7/2 sternal closure and wound vac. 7/9 Impella removed.08/20/19 Tracheostomy 08/24/19 cholecystostomy tube. 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 PMHx: HFrEF, COPD, HTN, T2DM, HLD, MDD, chronic back pain, vitamin deficiency, BPH, tobacco use disorder, marijuana use disorder    PT Comments    Pt with very flat affect, not engaging in questions or conversation today. Pt with resistive movement bil UE with increased tone into flexor posturing, with ability to stretch and extend with increased time and trials. Pt without active quad contraction in standing today but supported via strapping with limited knee ROM due to edema. Pt with increased tilt tolerance with 40 degree achieve for 6 min and 50 degrees for additional 9 min.   Pt on CPAP with Fio2 of 40% and SpO2 100% HR 73    Follow Up Recommendations  LTACH;SNF     Equipment Recommendations  Other (comment) (defer to next venue)    Recommendations for Other Services       Precautions / Restrictions Precautions Precautions: Fall;Sternal Precaution Comments: trach, chole drain, sacral wound, PEG Other Brace: Bil Prevalon    Mobility  Bed Mobility Overal bed mobility: Needs Assistance Bed Mobility: Rolling Rolling: Max assist;+2 for physical assistance         General bed mobility comments: max +2 assist to roll bil for linen change. pt with edematous bil LE with limited knee flexion. Total +2  to slide toward HOB. Use of bed tilt to position to stand. Positioned in chair position with pillow under left hip end of session  Transfers                    Ambulation/Gait                 Stairs             Wheelchair Mobility    Modified Rankin (Stroke Patients Only)       Balance                                            Cognition Arousal/Alertness: Awake/alert Behavior During Therapy: Flat affect Overall Cognitive Status: Impaired/Different from baseline                                 General Comments: pt very flat, not attempting to verbalize or mouth words today. Very limited (2-3) attempts for communication via nodding throughout session. Appears withdrawn      Exercises General Exercises - Upper Extremity Shoulder Flexion: AROM;Standing;Both;15 reps Elbow Flexion: AAROM;15 reps;Both;Standing Elbow Extension: AAROM;Both;Standing;15 reps Wrist Flexion: AAROM;Both;Seated;15 reps;Standing Wrist Extension: AAROM;Both;15 reps;Standing Digit Composite Flexion: AAROM;Both;15 reps;Standing Composite Extension: AAROM;Both;15 reps;Standing    General Comments        Pertinent Vitals/Pain Pain Assessment: No/denies pain    Home Living  Prior Function            PT Goals (current goals can now be found in the care plan section) Progress towards PT goals: Progressing toward goals    Frequency    Min 2X/week      PT Plan Current plan remains appropriate    Co-evaluation              AM-PAC PT "6 Clicks" Mobility   Outcome Measure  Help needed turning from your back to your side while in a flat bed without using bedrails?: Total Help needed moving from lying on your back to sitting on the side of a flat bed without using bedrails?: Total Help needed moving to and from a bed to a chair (including a wheelchair)?: Total Help needed standing up from a chair using  your arms (e.g., wheelchair or bedside chair)?: Total Help needed to walk in hospital room?: Total Help needed climbing 3-5 steps with a railing? : Total 6 Click Score: 6    End of Session   Activity Tolerance: Patient tolerated treatment well Patient left: in bed;with call bell/phone within reach;with nursing/sitter in room Nurse Communication: Mobility status;Need for lift equipment PT Visit Diagnosis: Unsteadiness on feet (R26.81);Muscle weakness (generalized) (M62.81);Difficulty in walking, not elsewhere classified (R26.2)     Time: 4008-6761 PT Time Calculation (min) (ACUTE ONLY): 40 min  Charges:  $Therapeutic Activity: 38-52 mins                     Floretta Petro P, PT Acute Rehabilitation Services Pager: [phone] Office: [phone]    Enedina Finner Korie Streat 10/02/2019, 1:59 PM

## 2019-10-02 NOTE — Progress Notes (Signed)
  Speech Language Pathology Treatment: Dysphagia;Passy Muir Speaking valve  Patient Details Name: Todd [name] MRN: [id number] DOB: 12/17/1955 Today's Date: 10/02/2019 Time: 7654-6503 SLP Time Calculation (min) (ACUTE ONLY): 17 min  Assessment / Plan / Recommendation Clinical Impression  Pt seen with RT for inline PMSV. Pt was less interactive today than compared to prior attempt. Affect flat, needs multiple repetitions at loud volume to respond verbally, typically one word responses. Would not participate in orientation activity. Pt was responsive to his wife on the phone; greeted her and told her he loved her. Pt accepted a few ice chips with adequate appearing, though mildly effortful swallow response. Pt not really interested in PO trials and shook his head no to more. Wanted to rest.    HPI HPI: Pt admitted on 6/17 with CHF exacerbation, found to have multi-vessel disease requiring CABG 6/24, post op VT Arrest 6/25 and cannulated for VA ECMO. S/p decannulation and impella. Pt has had a prolonged hospitalization with complications including intubation from 6/24 from CABG, Until 7/12 when trach placed, sepsis from fungal infection, tracehobronchitis, PEA arrest on 8/14 but f/c next day, awake and alert.       SLP Plan  Continue with current plan of care       Recommendations  Diet recommendations: NPO      Patient may use Passy-Muir Speech Valve: with SLP only (with RT) PMSV Supervision: Full         Oral Care Recommendations: Oral care QID Follow up Recommendations: LTACH SLP Visit Diagnosis: Dysphagia, oropharyngeal phase (R13.12) Plan: Continue with current plan of care       GO               [name], MA CCC-SLP  Acute Rehabilitation Services Pager [phone] Office [phone]  [name] 10/02/2019, 3:09 PM

## 2019-10-02 NOTE — Progress Notes (Signed)
46 Days Post-Op Procedure(s) (LRB): REMOVAL OF IMPELLA LEFT VENTRICULAR ASSIST DEVICE (N/A) TRANSESOPHAGEAL ECHOCARDIOGRAM (TEE) (N/A) Subjective: Alert, denies pain, nausea  Objective: Vital signs in last 24 hours: Temp:  [98 F (36.7 C)-98.4 F (36.9 C)] 98.4 F (36.9 C) (08/24 0729) Pulse Rate:  [52-125] 69 (08/24 0727) Cardiac Rhythm: Normal sinus rhythm;Sinus bradycardia (08/23 2000) Resp:  [8-26] 26 (08/24 0727) BP: (87-145)/(39-84) 106/61 (08/24 0727) SpO2:  [89 %-100 %] 100 % (08/24 0742) FiO2 (%):  [40 %] 40 % (08/24 0742) Weight:  [76 kg] 76 kg (08/24 0500)  Hemodynamic parameters for last 24 hours:    Intake/Output from previous day: 08/23 0701 - 08/24 0700 In: 1900 [NG/GT:1750] Out: 2085 [Urine:1535; Drains:150; Stool:400] Intake/Output this shift: No intake/output data recorded.  General appearance: alert, cooperative and no distress Neurologic: intact Heart: regular rate and rhythm Lungs: wheezes faint bilateral Abdomen: normal findings: soft, non-tender Wound: clean and dry  Lab Results: Recent Labs    10/01/19 0336 10/02/19 0408  WBC 18.8* 11.8*  HGB 9.2* 8.4*  HCT 31.8* 28.4*  PLT 205 182   BMET:  Recent Labs    10/01/19 0336 10/02/19 0408  NA 144 139  K 4.2 3.9  CL 100 97*  CO2 34* 35*  GLUCOSE 110* 87  BUN 120* 121*  CREATININE 1.25* 1.13  CALCIUM 8.1* 7.9*    PT/INR: No results for input(s): LABPROT, INR in the last 72 hours. ABG    Component Value Date/Time   PHART 7.366 09/22/2019 2147   HCO3 36.7 (H) 10/02/2019 0408   TCO2 41 (H) 09/22/2019 2147   ACIDBASEDEF 1.0 08/08/2019 1115   O2SAT 61.9 10/02/2019 0408   CBG (last 3)  Recent Labs    10/01/19 2345 10/02/19 0403 10/02/19 0727  GLUCAP 79 80 82    Assessment/Plan: S/P Procedure(s) (LRB): REMOVAL OF IMPELLA LEFT VENTRICULAR ASSIST DEVICE (N/A) TRANSESOPHAGEAL ECHOCARDIOGRAM (TEE) (N/A) -Overall about the same CV- stable in SR on amiodarone  BNP still  elevated, co-ox 62 off inotropes RESP_ CXR shows bilateral opacities- likely pulmonary edema, could be bilateral pneumonia with CXR resolution trailing clinical improvement or combination  Diurese in setting of elevated BNP RENAL- no current issues ENDO_ CBG well controlled on current regimen ID_ completed course of Levaquin- monitor fever, WBC- reculture if spikes GI- no further issues with emesis Nutrition- severe protein calorie malnutrition Skin- stage IV decub- continue hydrotherapy Deconditioning- PT/OT Anemia- Hgb down very slightly - monitor  LOS: 68 days    Todd [name] 10/02/2019

## 2019-10-03 DIAGNOSIS — L89304 Pressure ulcer of unspecified buttock, stage 4: Secondary | ICD-10-CM

## 2019-10-03 LAB — CBC
HCT: 23.8 % — ABNORMAL LOW (ref 39.0–52.0)
Hemoglobin: 7 g/dL — ABNORMAL LOW (ref 13.0–17.0)
MCH: 28.3 pg (ref 26.0–34.0)
MCHC: 29.4 g/dL — ABNORMAL LOW (ref 30.0–36.0)
MCV: 96.4 fL (ref 80.0–100.0)
Platelets: 170 10*3/uL (ref 150–400)
RBC: 2.47 MIL/uL — ABNORMAL LOW (ref 4.22–5.81)
RDW: 18.1 % — ABNORMAL HIGH (ref 11.5–15.5)
WBC: 12 10*3/uL — ABNORMAL HIGH (ref 4.0–10.5)
nRBC: 0 % (ref 0.0–0.2)

## 2019-10-03 LAB — COMPREHENSIVE METABOLIC PANEL
ALT: 74 U/L — ABNORMAL HIGH (ref 0–44)
AST: 102 U/L — ABNORMAL HIGH (ref 15–41)
Albumin: 1.4 g/dL — ABNORMAL LOW (ref 3.5–5.0)
Alkaline Phosphatase: 639 U/L — ABNORMAL HIGH (ref 38–126)
Anion gap: 6 (ref 5–15)
BUN: 134 mg/dL — ABNORMAL HIGH (ref 8–23)
CO2: 34 mmol/L — ABNORMAL HIGH (ref 22–32)
Calcium: 7.7 mg/dL — ABNORMAL LOW (ref 8.9–10.3)
Chloride: 100 mmol/L (ref 98–111)
Creatinine, Ser: 1.15 mg/dL (ref 0.61–1.24)
GFR calc Af Amer: >60 mL/min (ref >60)
GFR calc non Af Amer: >60 mL/min (ref >60)
Glucose, Bld: 203 mg/dL — ABNORMAL HIGH (ref 70–99)
Potassium: 4.3 mmol/L (ref 3.5–5.1)
Sodium: 140 mmol/L (ref 135–145)
Total Bilirubin: 5.5 mg/dL — ABNORMAL HIGH (ref 0.3–1.2)
Total Protein: 5.6 g/dL — ABNORMAL LOW (ref 6.5–8.1)

## 2019-10-03 LAB — MAGNESIUM: Magnesium: 3 mg/dL — ABNORMAL HIGH (ref 1.7–2.4)

## 2019-10-03 LAB — PHOSPHORUS: Phosphorus: 4.7 mg/dL — ABNORMAL HIGH (ref 2.5–4.6)

## 2019-10-03 LAB — GLUCOSE, CAPILLARY
Glucose-Capillary: 185 mg/dL — ABNORMAL HIGH (ref 70–99)
Glucose-Capillary: 187 mg/dL — ABNORMAL HIGH (ref 70–99)
Glucose-Capillary: 186 mg/dL — ABNORMAL HIGH (ref 70–99)
Glucose-Capillary: 191 mg/dL — ABNORMAL HIGH (ref 70–99)
Glucose-Capillary: 310 mg/dL — ABNORMAL HIGH (ref 70–99)

## 2019-10-03 NOTE — Progress Notes (Signed)
TCTS Evening Rounds  Chart reviewed  Stable 24 hours BP 113/60   Pulse 81   Temp 98.7 F (37.1 C) (Oral)   Resp 18   Ht 5\' 9"  (1.753 m)   Wt 72.2 kg   SpO2 92%   BMI 23.51 kg/m     Intake/Output Summary (Last 24 hours) at 10/03/2019 1712 Last data filed at 10/03/2019 1600 Gross per 24 hour  Intake 1920 ml  Output 1130 ml  Net 790 ml    A/p: Awaiting LTAC decision. Continue present management in the meantime. Mersadez Linden Z. 10/05/2019, MD [phone]

## 2019-10-03 NOTE — Progress Notes (Signed)
Occupational Therapy Treatment Patient Details Name: Todd [name] MRN: [id number] DOB: 1955-04-25 Today's Date: 10/03/2019    History of present illness Todd [name] is a 64 y.o. male presenting with shortness of breath; noted AKI, CHF, recent PNA, elevated troponin, pulm edema/effusion, and cardiomegaly.Marland Kitchen CABG 6/24 and intubated (extubated 6/25)--went into cardiac arrest on same date with re-opening of chest for heart massage and direct epicardial paddles and place on ECMO. 08/03/19 washout out for tamponade. 7/2 sternal closure and wound vac. 7/9 Impella removed.08/20/19 Tracheostomy 08/24/19 cholecystostomy tube. 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 PMHx: HFrEF, COPD, HTN, T2DM, HLD, MDD, chronic back pain, vitamin deficiency, BPH, tobacco use disorder, marijuana use disorder   OT comments  Pt with slow progress towards OT goals, awake and alert during today's session. Pt engaging in bil UE AAROM during session. Pt with improved ability to use RUE, noted reaching for L bedrail and pulling himself towards sidelying position start of session. Pt completing simple grooming ADL (face washing) using RUE with modA. He follows simple commands but does so intermittently and often requiring multimodal cues. SpO2 >/=90% on trach collar/vent (40% FiO2, PEEP 5), HR 85 and BP 113/60 (76) during session. Will continue per POC at this time.   Follow Up Recommendations  LTACH;Supervision/Assistance - 24 hour    Equipment Recommendations  Other (comment) (TBA)          Precautions / Restrictions Precautions Precautions: Fall;Sternal Precaution Comments: trach, chole drain, sacral wound, PEG Other Brace: Bil Prevalon Restrictions Weight Bearing Restrictions: No       Mobility Bed Mobility Overal bed mobility: Needs Assistance Bed Mobility: Rolling           General bed mobility comments: pt noted able to pull himself towards his L side using RUE to pull on  bedrail   Transfers                          ADL either performed or assessed with clinical judgement   ADL Overall ADL's : Needs assistance/impaired     Grooming: Moderate assistance;Wash/dry face;Sitting Grooming Details (indicate cue type and reason): assist to initiate task with pt able to carryover using RUE                                                       Cognition Arousal/Alertness: Awake/alert Behavior During Therapy: Flat affect Overall Cognitive Status: Impaired/Different from baseline                                 General Comments: intermittently following commands this session given increased time and multimodal cues        Exercises General Exercises - Upper Extremity Shoulder Flexion: AAROM;Both;10 reps Shoulder Horizontal ABduction: AAROM;Both;10 reps Elbow Flexion: AAROM;Left;5 reps Elbow Extension: AAROM;Left;5 reps Other Exercises Other Exercises: pt intermittently resistive to LUE ROM   Shoulder Instructions       General Comments      Pertinent Vitals/ Pain       Pain Assessment: Faces Faces Pain Scale: Hurts little more Pain Location: initially stating no pain, but then later nodding head "yes" to headache (holding his head) Pain Descriptors / Indicators: Discomfort;Headache Pain Intervention(s): Monitored during  session;Repositioned  Home Living                                          Prior Functioning/Environment              Frequency  Min 2X/week        Progress Toward Goals  OT Goals(current goals can now be found in the care plan section)  Progress towards OT goals: Progressing toward goals  Acute Rehab OT Goals Patient Stated Goal: pt unable to state OT Goal Formulation: Patient unable to participate in goal setting Time For Goal Achievement: 10/02/19 Potential to Achieve Goals: Fair  Plan Discharge plan remains appropriate     Co-evaluation                 AM-PAC OT "6 Clicks" Daily Activity     Outcome Measure   Help from another person eating meals?: Total Help from another person taking care of personal grooming?: A Lot Help from another person toileting, which includes using toliet, bedpan, or urinal?: Total Help from another person bathing (including washing, rinsing, drying)?: Total Help from another person to put on and taking off regular upper body clothing?: Total Help from another person to put on and taking off regular lower body clothing?: Total 6 Click Score: 7    End of Session Equipment Utilized During Treatment: Oxygen (Trach collar/vent)  OT Visit Diagnosis: Other abnormalities of gait and mobility (R26.89);Muscle weakness (generalized) (M62.81);Other symptoms and signs involving cognitive function;Pain Pain - Right/Left: Left Pain - part of body: Shoulder;Arm   Activity Tolerance Patient tolerated treatment well   Patient Left in bed;with call bell/phone within reach   Nurse Communication Mobility status        Time: 3704-8889 OT Time Calculation (min): 23 min  Charges: OT General Charges $OT Visit: 1 Visit OT Treatments $Self Care/Home Management : 8-22 mins $Therapeutic Activity: 8-22 mins  Marcy Siren, OT Acute Rehabilitation Services Pager [phone] Office [phone]   [name] 10/03/2019, 5:13 PM

## 2019-10-03 NOTE — Progress Notes (Signed)
NAME:  Todd [name], MRN:  [id number], DOB:  09-15-1955, LOS: 2 ADMISSION DATE:  07/26/2019, CONSULTATION DATE:  08/03/2019 REFERRING MD:  Roxan Hockey, CHIEF COMPLAINT:  ECMO    Brief History   64 year old male with history of ischemic cardiomyopathy admitted with CHF exacerbation, found to have multi-vessel disease requiring CABG 6/24, post op VT arrest 6/25 and cannulated for VA ECMO. S/p decannulation and impella. Hospital course has been complicated by cholecystitis, candidemia, and chronic hypoxic respiratory failure requiring tracheostomy.   Past Medical History   Past Medical History:  Diagnosis Date  . Arthritis   . CHF (congestive heart failure) ([place])   . Depression   . Diabetes mellitus without complication ([place])   . Hard of hearing   . Hyperlipidemia   . Hypertension    Significant Hospital Events   6/24 CABG 6/25 VA cannulation 6/28 decannulated and impella placed 6/29 bedside re-exploration; s/p decannulation. Placement of impella device originally at p8 but decreased to p4 2/2 suction events overnight up to p6. Echo completed and repositioned device. Remained on considerable support with 8 epi and 46 norepi vaso 0.05. continued chest tube output with large clots noted. Heparin thru device. Replacement products ongoing. 60% 8 peep 6/30 bedside mediastinal re-exploration and clean out of hematoma with tamponade and worsening hemodynamics. Pt had large volume transfusion. rebolused with amio 2/2 nsvt episode.  Weight up 48 pounds.  Started diuresis, Lasix drip started. 7/01 iatrogenic respiratory alkalosis, vent rate decreased. 7/02 No significant issues overnight remains on pressors and Impella not tolerating tube feeds with high gastric output awaiting core track placement 7/03 started trickle TF  7/05 Swab removed, CVL and PICC placed. 7/13 Trach and BAL 7/16 Awake and tracking. No follow commands. Still having fevers. Successful ultrasound and CT guided placement of a  10.2 French cholecystostomy tube. A small amount of aspirated bile was capped and sent to the laboratory for analysis 7/17 Off sedation, PSV.  Beginning to follow some commands occasionally intermittently.  Very jaundiced.  Growing Candida in the blood and BAL > diflucan.   7/21 Ongoing fevers, WBC increased 7/22 Persistent fevers, pan cultured with CT Chest/Head. Suspected source tracheobronchitis.  7/28 Transitioned to eliquis, jaundiced but labs improving  7/29 Agitation/restless.  Trazodone added QHS. Weaning midodrine, attempting diuresis  7/30 Increased Na, weaning on PSV.  8/01 On 40% ATC, significantly improved mental status 8/1>> ongoing attempts at trach weaning complicated by weakness, secretion management, and anxiety 8/8 started back on meropenem and milrinone 8/12 patient making slow improvements, he has been able to tolerate short course ATC 8/13 Weaned off milrinone 8/14 Code blue called for hypoxemia leading to PEA arrest. ROSC achieved after 6 min. 8/15: In  morning patient is unfazed from his recent events and continues asking for water to drink. Awake, alert and follows commands. 8/16 seen by surg per wound care recs-->"Do not recommend any acute surgical intervention. Will ultimately defer wound care recommendations to the wound care team, but I think it is reasonable to decrease hydrotherapy to 3x per week. I would increase dressing changes to BID and continue santyl for enzymatic debridement". 8/17: Using albumin and Lasix to try to mobilize anasarca 8/18: Tracheostomy downsized to 7.5.  Attempting short trials of aerosol trach collar 8/19-8/20 Now in a negative fluid balance.  Sodium continuing to rise.  Holding diuretics continuing to work on physical therapy and reconditioning, got a unit of blood in am for hgb 7.3. F/U hgb was 6.8, had scant amt  of bloody tracheal out-put but not enough to account for hgb loss. No frank blood in stools. Got 2 more units of PRBCs. Hgb now  9.7. w/out obvious source. Complaining of more pain than last few days. More restless. Stopped arixtra again. Added fent patch for pain needs.   Consults:  Advanced Heart Failure PCCM TCTS  Procedures:  LUE PICC 7/5 >>7/21 R Brachial ALine 7/9 >>7/27 Chest Tubes >> out L IJ CVC 7/21 >>7/27 Peg tube 7/27 >> Tracheostomy downsized to 7.5 on 8/18  Significant Diagnostic Tests:  6/22 spirometry with restrictive physiology, preserved FEV1/FVC 6/18 echo: LVEF 17-91%, grade I diastolic dysfunction 5/05 US Abdomen cholelithiasis without cholecystitis. Mild wall thinking in setting of liver disease and ascites.  7/10 LE [place] negative for DVT  7/20 CT ABD w contrast >> stable position of the percutaneous cholecystostomy tube with complete decompression of the gallbladder, trace residual free fluid within the RUQ, decreased since prior, trace bilateral pleural effusions L>R, scattered areas of ground glass airspace disease within the RML, RLL 7/22 CT Chest w/o Contrast >> Retrosternal fluid collection, non specific.  Bilateral pleural effusions, portions loculated.   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 Stable bilateral mastoid and middle ear effusions.   Micro Data:  7/3 RQ> Few candida tropicalis, likely contaminant 7/12 BAL Cx> Candida tropicalis and albicans, likely contaminant 7/14 BCx >> candida parapsilosis 7/17 BCx >> neg resp 8/7 >> stenotrophomonas  Antimicrobials:  Meropenem 8/8 >>8/10 Diflucan  7/16 >> 8/9 vanc 7/22 >>8/10 levaquin 8/10 >> 8/23  Interim history/subjective:  No acute overnight events; tolerating pressure support for several hours; unable to tolerate trach collar at this time.   Objective   Blood pressure 102/61, pulse 88, temperature 98.6 F (37 C), temperature source Oral, resp. rate (!) 21, height _0  (1.753 m), weight 72.2 kg, SpO2 (!) 89 %.    Vent Mode: PRVC FiO2 (%):   [40 %] 40 % Set Rate:  [20 bmp] 20 bmp Vt Set:  [420 mL] 420 mL PEEP:  [0 cmH20-5 cmH20] 5 cmH20 Pressure Support:  [14 cmH20] 14 cmH20 Plateau Pressure:  [10 WPV94-80 cmH20] 17 cmH20   Intake/Output Summary (Last 24 hours) at 10/03/2019 1020 Last data filed at 10/03/2019 0900 Gross per 24 hour  Intake 1640 ml  Output 2640 ml  Net -1000 ml   Filed Weights   10/01/19 0500 10/02/19 0500 10/03/19 0500  Weight: 74.5 kg 76 kg 72.2 kg    Examination: General: frail elderly male, currently on full mechanical support  HENT: [place]/AT, tracheostomy site clean/dry/intact; EOMI, anicteric sclerae  Lungs: Diffuse rhonchi on auscultation Cardiovascular: RRR, S1 and S2 present, no m/r/g Abdomen: soft, nondistended; + bowel sounds; RUQ drain in place with minimal bloody output  Extremities: cachexia; scattered echymosis; ischemic right index finger; warm and dry  Neuro: awake and alert, following commands appropriately  Resolved Hospital Problem list   Suspected HCAP Acute kidney injury Hypernatremia Acute Metabolic Encephalopathy  -CTH without acute intracranial pathology. EEG negative 7/23.  Shock Heparin-induced thrombocytopenia >> resolved, required bivalirudin 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 -Opthalmology evaluated , diflucan x 3 weeks PEA arrest 8/14 preceded by hypoxemia Assessment & Plan:  Acute hypoxic respiratory failure requiring mechanical ventilation with severe weakness/deconditioning Tracheostomy dependence; sedation minimized yesterday for concerns of hypoventilation; RR improved this morning - Continue full vent support with weaning trials as able - Titrate FiO2 as able to maintain SpO2 >90% -  Routine trach care - VAP prevention and chest physiotherapy to help with mucous plugging   Hypervolemia with pulmonary congestion Improving with diuresis; was able to tolerate pressure support for ~4 hours yesterday.  - Lasix 21m daily  - Strict I&O - Monitor  renal function   Cardiogenic/hemorrhagic shock s/p CABG complicated by VT arrest S/p VA ECMO (decannulated 6/28), s/p impella placement (6/28>7/9) Atrial flutter, reverted to NSR - Continue amiodarone  Hyperbilirubinemia 2/2 acalculous cholecystitis vs drug induced cholestasis S/p percutaneous GB drain by IR on 7/17 - Trending intermittent LFTs  - GB drain to stay in place 6-8 weeks   Diabetes mellitus II highly sensitive to insulin CBGs in 70's. Will decrease levemir to 5u bid and hold tube feed novolog. 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 minimal output in biliary drain - would not account for degree of Hb drop. Suspect possible GI bleed given worsening azotemia.  Will consider GI consultation for further evaluation  - Will continue to trend Hb; transfuse as needed for Hb <7 - Unclear source of blood loss at this time  - Will hold arixtra for now   Acute renal injury:  Worsening BUN; at high risk for uremic complications. Electrolytes currently stable. - Continue to trend renal function   Best practice:  Diet: tube feeds Pain/Anxiety/Delirium protocol (if indicated): per protocol VAP protocol (if indicated): per protocol DVT prophylaxis: Arixtra  GI prophylaxis: PPI Glucose control: Levemir + Novolog + SSI Mobility: PT/OT as able  Code Status: FULL Family Communication: per primary Disposition: ICU - awaiting LTACH placement  Critical care time: 40 minutes   SHarvie Heck MD Internal Medicine, PGY-2 10/03/19 10:20 AM Pager # [phone]

## 2019-10-03 NOTE — Progress Notes (Signed)
47 Days Post-Op Procedure(s) (LRB): REMOVAL OF IMPELLA LEFT VENTRICULAR ASSIST DEVICE (N/A) TRANSESOPHAGEAL ECHOCARDIOGRAM (TEE) (N/A) Subjective: alert  Objective: Vital signs in last 24 hours: Temp:  [97.3 F (36.3 C)-98.8 F (37.1 C)] 98.3 F (36.8 C) (08/25 0421) Pulse Rate:  [60-81] 78 (08/25 0600) Cardiac Rhythm: Normal sinus rhythm (08/24 2000) Resp:  [12-26] 14 (08/25 0600) BP: (100-126)/(47-87) 112/49 (08/25 0600) SpO2:  [92 %-100 %] 98 % (08/25 0600) FiO2 (%):  [40 %] 40 % (08/25 0421) Weight:  [72.2 kg] 72.2 kg (08/25 0500)  Hemodynamic parameters for last 24 hours:    Intake/Output from previous day: 08/24 0701 - 08/25 0700 In: 1500 [I.V.:10; NG/GT:1330] Out: 3165 [Urine:2755; Drains:75; Stool:335] Intake/Output this shift: No intake/output data recorded.  General appearance: alert, cooperative and no distress Neurologic: intact Heart: regular rate and rhythm Lungs: rhonchi bilateral Abdomen: normal findings: soft, non-tender Wound: Sternal-clean and dry. Stage IV sacral decubitus  Lab Results: Recent Labs    10/02/19 0408 10/03/19 0451  WBC 11.8* 12.0*  HGB 8.4* 7.0*  HCT 28.4* 23.8*  PLT 182 170   BMET:  Recent Labs    10/02/19 0408 10/03/19 0451  NA 139 140  K 3.9 4.3  CL 97* 100  CO2 35* 34*  GLUCOSE 87 203*  BUN 121* 134*  CREATININE 1.13 1.15  CALCIUM 7.9* 7.7*    PT/INR: No results for input(s): LABPROT, INR in the last 72 hours. ABG    Component Value Date/Time   PHART 7.366 09/22/2019 2147   HCO3 36.7 (H) 10/02/2019 0408   TCO2 41 (H) 09/22/2019 2147   ACIDBASEDEF 1.0 08/08/2019 1115   O2SAT 61.9 10/02/2019 0408   CBG (last 3)  Recent Labs    10/02/19 1952 10/02/19 2344 10/03/19 0426  GLUCAP 186* 192* 185*    Assessment/Plan: S/P Procedure(s) (LRB): REMOVAL OF IMPELLA LEFT VENTRICULAR ASSIST DEVICE (N/A) TRANSESOPHAGEAL ECHOCARDIOGRAM (TEE) (N/A) -NEURO- intact CV- in SR, will recheck BNP and co-ox  tomorrow RESP- VDRF- continue diuresis,, vent per CCM RENAL- creatinine and lytes OK. Diurese GI- tolerating TF  Liver enzymes stable, output from perc down- will try clamping again ID- completed 2 weeks of Levaquin, observe off antibiotics Deconditioning- severe   LOS: 69 days    [name] 10/03/2019

## 2019-10-03 NOTE — TOC Progression Note (Addendum)
Transition of Care [facility]) - Progression Note    Patient Details  Name: Todd [name] MRN: [id number] Date of Birth: 1955/07/25  [facility]) CM/SW Contact  Terrial Rhodes, LCSWA Phone Number: 10/03/2019, 1:19 PM  Clinical Narrative:     Update 4:39pm 8/25- CSW received confirmation from [facility] that they received fax. [facility] will follow up with CSW once they hear back from appeal.  CSW will continue to follow.  Update 4:13pm 8/25-CSW received Signed Expedited Appeal form from MD. CSW faxed it to [place] at [facility]. Rob with LT[facility] will let CSW know when he has received appeal.  CSW will continue to follow.  Update 2:00pm 8/25-CSW spoke with MD and he is going to sign appeal form. CSW will fax over form to [place] as soon as form is signed.  CSW will continue to follow.   CSW spoke with TCTS office and left voicemail too call back regarding appeal form that MD needs to sign. CSW awaiting callback.  CSW will continue to follow.  CSW spoke with Irving Burton with [facility] who notified CSW that insurance denied LTAC placement for patient. Irving Burton said we can try an expedited appeal. Irving Burton sent CSW form for MD to sign and fax back. CSW worked with Management consultant and placed form on patients hard chart for MD to sign. CSW will fax signed form back to [place] when signed. CSW spoke with patients spouse and informed her that we are going to try and appeal for possible LTAC placement at [facility] for patient. CSW will follow up with patients spouse as soon as there are any new updates with appeal.  CSW will continue to follow.  Expected Discharge Plan: Long Term Acute Care (LTAC) Barriers to Discharge: Insurance Authorization  Expected Discharge Plan and Services Expected Discharge Plan: Long Term Acute Care (LTAC)   Discharge Planning Services: CM Consult Post Acute Care Choice: Long Term Acute Care (LTAC) Living arrangements for the past 2 months: Single Family Home                                        Social Determinants of Health (SDOH) Interventions    Readmission Risk Interventions Readmission Risk Prevention Plan 09/03/2019  Transportation Screening Complete  PCP or Specialist Appt within 3-5 Days Complete  HRI or Home Care Consult Complete  Social Work Consult for Recovery Care Planning/Counseling Complete  Palliative Care Screening Complete  Medication Review [employer]ographer) Complete  Some recent data might be hidden

## 2019-10-03 NOTE — Progress Notes (Signed)
Physical Therapy Wound Treatment Patient Details  Name: DELVON [name] MRN: [id number] Date of Birth: 1955/07/01  Today's Date: 10/03/2019 Time: 1020-1113 Time Calculation (min): 53 min  Subjective  Subjective: pt only opened his eyes a couple of times today  Patient and Family Stated Goals: unable  Date of Onset:  (unknown) Prior Treatments: foam dressing  Pain Score:  Pt with 8/10 facial grimace with pulse lavage.   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 has evolved into stage 4 when assessed on 8/9 (Active)  Wound Image   09/26/19 1500  Dressing Type ABD;Barrier Film (skin prep);Normal saline moist dressing 10/03/19 1300  Dressing Clean;Dry;Intact 10/03/19 1300  Dressing Change Frequency Daily 10/03/19 1300  State of Healing Non-healing 10/03/19 1300  Site / Wound Assessment Yellow;Pink;Brown;Red 10/03/19 1300  % Wound base Red or Granulating 5% 10/03/19 1300  % Wound base Yellow/Fibrinous Exudate 30% 10/03/19 1300  % Wound base Black/Eschar 55% 10/03/19 1300  % Wound base Other/Granulation Tissue (Comment) 5% 10/03/19 1300  Peri-wound Assessment Erythema (blanchable) 10/03/19 1300  Wound Length (cm) 6.5 cm 10/03/19 1300  Wound Width (cm) 5.2 cm 10/03/19 1300  Wound Depth (cm) 2.3 cm 10/03/19 1300  Wound Surface Area (cm^2) 33.8 cm^2 10/03/19 1300  Wound Volume (cm^3) 77.74 cm^3 10/03/19 1300  Tunneling (cm) 7 cm at 5 o'clock 09/26/19 1500  Undermining (cm) 1.8 cm at 12 o'clock, 1.7 cm at 3 o'clock, 3.2 cm at 5 o'clock, 0.5 cm at 6 o'clock, 1.7 cm at 9 o'clock 10/03/19 1300  Margins Unattached edges (unapproximated) 10/03/19 1300  Drainage Amount Minimal 10/03/19 1300  Drainage Description Serosanguineous 10/03/19 1300  Treatment Debridement (Selective);Hydrotherapy (Pulse lavage);Packing (Saline gauze) 10/03/19 1300     Dressing Type Foam - Lift dressing to assess site every shift 10/03/19  0800  Dressing Changed Changed 10/02/19 2000  Dressing Status Clean 10/02/19 2000  Dressing Change Frequency PRN 10/02/19 2000  Site / Wound Assessment Dressing in place / Unable to assess 10/02/19 0900  Peri-wound Assessment Intact 10/02/19 0900  Wound Length (cm) 3.5 cm 10/03/19 1000  Wound Width (cm) 1 cm 10/03/19 1000  Wound Depth (cm) 0 cm 10/03/19 1000  Wound Volume (cm^3) 0 cm^3 10/03/19 1000  Wound Surface Area (cm^2) 3.5 cm^2 10/03/19 1000  Margins Attached edges (approximated) 10/02/19 0900  Treatment Other (Comment) 09/28/19 2000   Hydrotherapy Pulsed lavage therapy - wound location: sacrum Pulsed Lavage with Suction (psi): 12 psi Pulsed Lavage with Suction - Normal Saline Used: 1000 mL Pulsed Lavage Tip: Tip with splash shield Selective Debridement Selective Debridement - Location: sacrum Selective Debridement - Tools Used: Forceps;Scissors Selective Debridement - Tissue Removed: yellow/gray slough   Wound Assessment and Plan  Wound Therapy - Assess/Plan/Recommendations Wound Therapy - Clinical Statement: Wound continues to easily bleed limiting selective debridement. Continue to rely on Santyl application for enzymatic debridement. Pt continues to benefit from pulse lavage and selective debridement 3x/week. Wound Therapy - Functional Problem List: decreased mobility Factors Delaying/Impairing Wound Healing: Diabetes Mellitus;Incontinence;Immobility;Multiple medical problems Hydrotherapy Plan: Debridement;Dressing change;Patient/family education;Pulsatile lavage with suction Wound Therapy - Frequency: 3X / week Wound Therapy - Follow Up Recommendations: Other (comment) (LTACH) Wound Plan: see above  Wound Therapy Goals- Improve the function of patient's integumentary system by progressing the wound(s) through the phases of wound healing (inflammation - proliferation - remodeling) by: Decrease Necrotic Tissue to: 60 Decrease Necrotic Tissue - Progress: Not  progressing Increase Granulation Tissue to:  40 Increase Granulation Tissue - Progress: Mot progressing Goals/treatment plan/discharge plan were made with and agreed upon by patient/family: No, Patient unable to participate in goals/treatment/discharge plan and family unavailable Time For Goal Achievement: 7 days Wound Therapy - Potential for Goals: Poor  Goals will be updated until maximal potential achieved or discharge criteria met.  Discharge criteria: when goals achieved, discharge from hospital, MD decision/surgical intervention, no progress towards goals, refusal/missing three consecutive treatments without notification or medical reason.  GP    Dani Gobble. Migdalia Dk PT, DPT Acute Rehabilitation Services Pager [phone] Office [phone]  [place] 10/03/2019, 1:43 PM

## 2019-10-03 NOTE — Progress Notes (Signed)
PIV removed per protocol. Skin tear dressings changed.

## 2019-10-03 NOTE — Progress Notes (Signed)
RT note- Patient with sp02 86-88%, changed back to full support.

## 2019-10-04 ENCOUNTER — Inpatient Hospital Stay (HOSPITAL_COMMUNITY): Payer: Medicare Other

## 2019-10-04 LAB — PREPARE RBC (CROSSMATCH)

## 2019-10-04 LAB — CBC
HCT: 22.6 % — ABNORMAL LOW (ref 39.0–52.0)
Hemoglobin: 6.8 g/dL — CL (ref 13.0–17.0)
MCH: 29.2 pg (ref 26.0–34.0)
MCHC: 30.1 g/dL (ref 30.0–36.0)
MCV: 97 fL (ref 80.0–100.0)
Platelets: 169 10*3/uL (ref 150–400)
RBC: 2.33 MIL/uL — ABNORMAL LOW (ref 4.22–5.81)
RDW: 18.7 % — ABNORMAL HIGH (ref 11.5–15.5)
WBC: 11.5 10*3/uL — ABNORMAL HIGH (ref 4.0–10.5)
nRBC: 0 % (ref 0.0–0.2)

## 2019-10-04 LAB — COMPREHENSIVE METABOLIC PANEL
ALT: 81 U/L — ABNORMAL HIGH (ref 0–44)
AST: 97 U/L — ABNORMAL HIGH (ref 15–41)
Albumin: 1.6 g/dL — ABNORMAL LOW (ref 3.5–5.0)
Alkaline Phosphatase: 713 U/L — ABNORMAL HIGH (ref 38–126)
Anion gap: 8 (ref 5–15)
BUN: 148 mg/dL — ABNORMAL HIGH (ref 8–23)
CO2: 36 mmol/L — ABNORMAL HIGH (ref 22–32)
Calcium: 8 mg/dL — ABNORMAL LOW (ref 8.9–10.3)
Chloride: 99 mmol/L (ref 98–111)
Creatinine, Ser: 1.25 mg/dL — ABNORMAL HIGH (ref 0.61–1.24)
GFR calc Af Amer: >60 mL/min (ref >60)
GFR calc non Af Amer: >60 mL/min (ref >60)
Glucose, Bld: 370 mg/dL — ABNORMAL HIGH (ref 70–99)
Potassium: 4.3 mmol/L (ref 3.5–5.1)
Sodium: 143 mmol/L (ref 135–145)
Total Bilirubin: 5.4 mg/dL — ABNORMAL HIGH (ref 0.3–1.2)
Total Protein: 5.9 g/dL — ABNORMAL LOW (ref 6.5–8.1)

## 2019-10-04 LAB — GLUCOSE, CAPILLARY
Glucose-Capillary: 135 mg/dL — ABNORMAL HIGH (ref 70–99)
Glucose-Capillary: 214 mg/dL — ABNORMAL HIGH (ref 70–99)
Glucose-Capillary: 318 mg/dL — ABNORMAL HIGH (ref 70–99)
Glucose-Capillary: 322 mg/dL — ABNORMAL HIGH (ref 70–99)
Glucose-Capillary: 331 mg/dL — ABNORMAL HIGH (ref 70–99)
Glucose-Capillary: 339 mg/dL — ABNORMAL HIGH (ref 70–99)

## 2019-10-04 LAB — COOXEMETRY PANEL
Carboxyhemoglobin: 2.3 % — ABNORMAL HIGH (ref 0.5–1.5)
Methemoglobin: 1.2 % (ref 0.0–1.5)
O2 Saturation: 76.7 %
Total hemoglobin: 7 g/dL — ABNORMAL LOW (ref 12.0–16.0)

## 2019-10-04 LAB — BRAIN NATRIURETIC PEPTIDE: B Natriuretic Peptide: 3792.9 pg/mL — ABNORMAL HIGH (ref 0.0–100.0)

## 2019-10-04 LAB — HEMOGLOBIN AND HEMATOCRIT, BLOOD
HCT: 27.5 % — ABNORMAL LOW (ref 39.0–52.0)
Hemoglobin: 8.4 g/dL — ABNORMAL LOW (ref 13.0–17.0)

## 2019-10-04 LAB — MAGNESIUM: Magnesium: 3.2 mg/dL — ABNORMAL HIGH (ref 1.7–2.4)

## 2019-10-04 LAB — PHOSPHORUS: Phosphorus: 3.2 mg/dL (ref 2.5–4.6)

## 2019-10-04 MED ORDER — FUROSEMIDE 10 MG/ML IJ SOLN
40.0000 mg | Freq: Two times a day (BID) | INTRAMUSCULAR | Status: DC
  Administered 2019-10-04 – 2019-10-06 (×5): 40 mg via INTRAVENOUS
  Filled 2019-10-04 (×6): qty 4

## 2019-10-04 MED ORDER — SODIUM CHLORIDE 0.9% IV SOLUTION: Freq: Once | INTRAVENOUS | Status: AC

## 2019-10-04 MED ORDER — INSULIN ASPART 100 UNIT/ML ~~LOC~~ SOLN
0.0000 [IU] | SUBCUTANEOUS | Status: DC
  Administered 2019-10-04: 1 [IU] via SUBCUTANEOUS
  Administered 2019-10-04: 3 [IU] via SUBCUTANEOUS
  Administered 2019-10-04: 7 [IU] via SUBCUTANEOUS
  Administered 2019-10-05: 1 [IU] via SUBCUTANEOUS
  Administered 2019-10-05: 2 [IU] via SUBCUTANEOUS
  Administered 2019-10-05: 1 [IU] via SUBCUTANEOUS
  Administered 2019-10-06 (×2): 3 [IU] via SUBCUTANEOUS
  Administered 2019-10-06: 2 [IU] via SUBCUTANEOUS
  Administered 2019-10-06: 3 [IU] via SUBCUTANEOUS
  Administered 2019-10-06 (×2): 7 [IU] via SUBCUTANEOUS
  Administered 2019-10-06: 3 [IU] via SUBCUTANEOUS
  Administered 2019-10-07: 5 [IU] via SUBCUTANEOUS
  Administered 2019-10-07 (×2): 3 [IU] via SUBCUTANEOUS
  Administered 2019-10-07: 2 [IU] via SUBCUTANEOUS
  Administered 2019-10-07: 5 [IU] via SUBCUTANEOUS
  Administered 2019-10-08: 0 [IU] via SUBCUTANEOUS
  Administered 2019-10-08: 1 [IU] via SUBCUTANEOUS
  Administered 2019-10-08: 5 [IU] via SUBCUTANEOUS
  Administered 2019-10-08: 1 [IU] via SUBCUTANEOUS
  Administered 2019-10-08: 3 [IU] via SUBCUTANEOUS
  Administered 2019-10-09 (×2): 2 [IU] via SUBCUTANEOUS

## 2019-10-04 MED ORDER — INSULIN DETEMIR 100 UNIT/ML ~~LOC~~ SOLN
10.0000 [IU] | Freq: Two times a day (BID) | SUBCUTANEOUS | Status: DC
  Administered 2019-10-04 – 2019-10-08 (×8): 10 [IU] via SUBCUTANEOUS
  Filled 2019-10-04 (×11): qty 0.1

## 2019-10-04 MED ORDER — SODIUM CHLORIDE 0.9% IV SOLUTION: Freq: Once | INTRAVENOUS | Status: DC

## 2019-10-04 NOTE — Progress Notes (Signed)
  Speech Language Pathology Treatment: Dysphagia;Passy Muir Speaking valve  Patient Details Name: Todd [name] MRN: [id number] DOB: Sep 24, 1955 Today's Date: 10/04/2019 Time: 1007-1030 SLP Time Calculation (min) (ACUTE ONLY): 23 min  Assessment / Plan / Recommendation Clinical Impression  Pt seen for inline PMSV trial (PS 14, PEEP 0). Though he is still very fatigued, pt was better able to pariticpate in conversation with his wife with PMSV in place. He initially not very responsive to orientation questions other than a nod, but when wife was speaking to him he consistently answered her questions verbally and increased volume if a revision was requested. Pt tolerated about 10 minutes on the valve. Vital signs remained stable. Pt requested an ice chip but needed total support to hold head upright. He could not fully achieve a labial seal on ice and it was lost posteriorly with throat clearing. Oral phase is no longer sufficient for PO trials given advanced weakness. Recommend pt continue attempts with inline PMSV as tolerated given that it does seem to raise his spirits to talk with his wife.   HPI HPI: Pt admitted on 6/17 with CHF exacerbation, found to have multi-vessel disease requiring CABG 6/24, post op VT Arrest 6/25 and cannulated for VA ECMO. S/p decannulation and impella. Pt has had a prolonged hospitalization with complications including intubation from 6/24 from CABG, Until 7/12 when trach placed, sepsis from fungal infection, tracehobronchitis, PEA arrest on 8/14 but f/c next day, awake and alert.       SLP Plan  Continue with current plan of care       Recommendations  Diet recommendations: NPO      Patient may use Passy-Muir Speech Valve: with SLP only PMSV Supervision: Full         Plan: Continue with current plan of care       GO               Todd Ditty, MA CCC-SLP  Acute Rehabilitation Services Pager [phone] Office [phone]  Todd [name] 10/04/2019, 11:26 AM

## 2019-10-04 NOTE — Progress Notes (Signed)
Sputum now yellow/greenish in color when suctioning from trach. MD made aware new order received will continue to monitor.

## 2019-10-04 NOTE — Progress Notes (Signed)
Pt placed back on full ventilator support for night time rest.  RT will continue to wean patient again in the morning.

## 2019-10-04 NOTE — Progress Notes (Signed)
Chest vest not administered due to patient having desating episodes throughout the treatment.

## 2019-10-04 NOTE — Progress Notes (Signed)
EVENING ROUNDS NOTE :     [address]       [place] [id number]             [phone]                 48 Days Post-Op Procedure(s) (LRB): REMOVAL OF IMPELLA LEFT VENTRICULAR ASSIST DEVICE (N/A) TRANSESOPHAGEAL ECHOCARDIOGRAM (TEE) (N/A)   Total Length of Stay:  LOS: 70 days  Events:   No events BP stable GI consulted, obs for now    BP (!) 117/57   Pulse 70   Temp 98.3 F (36.8 C) (Oral)   Resp 13   Ht 5\' 9"  (1.753 m)   Wt 73.4 kg   SpO2 90%   BMI 23.90 kg/m      Vent Mode: PSV;CPAP FiO2 (%):  [40 %] 40 % Set Rate:  [20 bmp] 20 bmp Vt Set:  [420 mL] 420 mL PEEP:  [5 cmH20] 5 cmH20 Pressure Support:  [14 cmH20] 14 cmH20 Plateau Pressure:  [15 cmH20-25 cmH20] 15 cmH20  . feeding supplement (PIVOT 1.5 CAL) Stopped (10/04/19 1300)    I/O last 3 completed shifts: In: 2681 [I.V.:10; Other:270; NG/GT:2401] Out: 2735 [Urine:2485; Drains:25; Stool:225]   CBC Latest Ref Rng & Units 10/04/2019 10/03/2019 10/02/2019  WBC 4.0 - 10.5 K/uL 11.5(H) 12.0(H) 11.8(H)  Hemoglobin 13.0 - 17.0 g/dL 6.8(LL) 7.0(L) 8.4(L)  Hematocrit 39 - 52 % 22.6(L) 23.8(L) 28.4(L)  Platelets 150 - 400 K/uL 169 170 182    BMP Latest Ref Rng & Units 10/04/2019 10/03/2019 10/02/2019  Glucose 70 - 99 mg/dL 10/04/2019) 003(K) 87  BUN 8 - 23 mg/dL 917(H) 150(V) 697(X)  Creatinine 0.61 - 1.24 mg/dL 480(X) 6.55(V 7.48  Sodium 135 - 145 mmol/L 143 140 139  Potassium 3.5 - 5.1 mmol/L 4.3 4.3 3.9  Chloride 98 - 111 mmol/L 99 100 97(L)  CO2 22 - 32 mmol/L 36(H) 34(H) 35(H)  Calcium 8.9 - 10.3 mg/dL 8.0(L) 7.7(L) 7.9(L)    ABG    Component Value Date/Time   PHART 7.366 09/22/2019 2147   PCO2ART 68.3 (HH) 09/22/2019 2147   PO2ART 76 (L) 09/22/2019 2147   HCO3 36.7 (H) 10/02/2019 0408   TCO2 41 (H) 09/22/2019 2147   ACIDBASEDEF 1.0 08/08/2019 1115   O2SAT 76.7 10/04/2019 0434       10/06/2019, MD 10/04/2019 5:58 PM

## 2019-10-04 NOTE — Progress Notes (Signed)
eLink Physician-Brief Progress Note Patient Name: KEYONTE COOKSTON DOB: 02-13-1955 MRN: [id number]   Date of Service  10/04/2019  HPI/Events of Note  Anemia - Hgb = 6.8.   eICU Interventions  Will transfuse 1 unit PRBC.     Intervention Category Major Interventions: Other:  Harlo Fabela Dennard Nip 10/04/2019, 6:24 AM

## 2019-10-04 NOTE — Progress Notes (Signed)
48 Days Post-Op Procedure(s) (LRB): REMOVAL OF IMPELLA LEFT VENTRICULAR ASSIST DEVICE (N/A) TRANSESOPHAGEAL ECHOCARDIOGRAM (TEE) (N/A) Subjective: Sleeping this AM  Objective: Vital signs in last 24 hours: Temp:  [97.6 F (36.4 C)-98.7 F (37.1 C)] 98.7 F (37.1 C) (08/26 0754) Pulse Rate:  [25-89] 84 (08/26 0800) Cardiac Rhythm: Normal sinus rhythm (08/25 2000) Resp:  [15-30] 25 (08/26 0800) BP: (97-130)/(48-87) 120/69 (08/26 0800) SpO2:  [88 %-100 %] 96 % (08/26 0800) FiO2 (%):  [40 %-60 %] 40 % (08/26 0752) Weight:  [73.4 kg] 73.4 kg (08/26 0500)  Hemodynamic parameters for last 24 hours:    Intake/Output from previous day: 08/25 0701 - 08/26 0700 In: 1801 [NG/GT:1561] Out: 1655 [Urine:1655] Intake/Output this shift: No intake/output data recorded.  General appearance: cooperative and no distress Neurologic: intact Heart: regular rate and rhythm Lungs: coarse BS bilaterally Abdomen: normal findings: soft, non-tender some drainage around perc drain  Lab Results: Recent Labs    10/03/19 0451 10/04/19 0434  WBC 12.0* 11.5*  HGB 7.0* 6.8*  HCT 23.8* 22.6*  PLT 170 169   BMET:  Recent Labs    10/03/19 0451 10/04/19 0434  NA 140 143  K 4.3 4.3  CL 100 99  CO2 34* 36*  GLUCOSE 203* 370*  BUN 134* 148*  CREATININE 1.15 1.25*  CALCIUM 7.7* 8.0*    PT/INR: No results for input(s): LABPROT, INR in the last 72 hours. ABG    Component Value Date/Time   PHART 7.366 09/22/2019 2147   HCO3 36.7 (H) 10/02/2019 0408   TCO2 41 (H) 09/22/2019 2147   ACIDBASEDEF 1.0 08/08/2019 1115   O2SAT 76.7 10/04/2019 0434   CBG (last 3)  Recent Labs    10/04/19 0026 10/04/19 0441 10/04/19 0757  GLUCAP 318* 339* 322*    Assessment/Plan: S/P Procedure(s) (LRB): REMOVAL OF IMPELLA LEFT VENTRICULAR ASSIST DEVICE (N/A) TRANSESOPHAGEAL ECHOCARDIOGRAM (TEE) (N/A) -NEURO- intact CV- in SR  BNP more elevated despite co-ox of 72  Increase lasix to BID RESP_ VDRF,  little progress with weaning RENAL- creatinine up slightly but reasonably stable  BUN up - likely secondary to GIB GI- tolerating TF, GI consult to assess for possible endoscopy  On BID protonix  LFTs stable with perc drain clamped- leave clamped again today HEME- Hgb down, likely GI source, transfuse 2 units ID- afebrile off antibiotics ENDO- CBG elevated since insulin decreased  Increase levemir, change to sensitive SSI from very sensitive Nutrition- protein calorie malnutrition, continue TF   LOS: 70 days    [name] 10/04/2019

## 2019-10-04 NOTE — Progress Notes (Signed)
NAME:  Todd [name], MRN:  [id number], DOB:  Apr 02, 1955, LOS: 78 ADMISSION DATE:  07/26/2019, CONSULTATION DATE:  08/03/2019 REFERRING MD:  Roxan Hockey, CHIEF COMPLAINT:  ECMO    Brief History   64 year old male with history of ischemic cardiomyopathy admitted with CHF exacerbation, found to have multi-vessel disease requiring CABG 6/24, post op VT arrest 6/25 and cannulated for VA ECMO. S/p decannulation and impella. Hospital course has been complicated by cholecystitis, candidemia, and chronic hypoxic respiratory failure requiring tracheostomy.   Past Medical History   Past Medical History:  Diagnosis Date  . Arthritis   . CHF (congestive heart failure) ([place])   . Depression   . Diabetes mellitus without complication ([place])   . Hard of hearing   . Hyperlipidemia   . Hypertension    Significant Hospital Events   6/24 CABG 6/25 VA cannulation 6/28 decannulated and impella placed 6/29 bedside re-exploration; s/p decannulation. Placement of impella device originally at p8 but decreased to p4 2/2 suction events overnight up to p6. Echo completed and repositioned device. Remained on considerable support with 8 epi and 46 norepi vaso 0.05. continued chest tube output with large clots noted. Heparin thru device. Replacement products ongoing. 60% 8 peep 6/30 bedside mediastinal re-exploration and clean out of hematoma with tamponade and worsening hemodynamics. Pt had large volume transfusion. rebolused with amio 2/2 nsvt episode.  Weight up 48 pounds.  Started diuresis, Lasix drip started. 7/01 iatrogenic respiratory alkalosis, vent rate decreased. 7/02 No significant issues overnight remains on pressors and Impella not tolerating tube feeds with high gastric output awaiting core track placement 7/03 started trickle TF  7/05 Swab removed, CVL and PICC placed. 7/13 Trach and BAL 7/16 Awake and tracking. No follow commands. Still having fevers. Successful ultrasound and CT guided placement of a  10.2 French cholecystostomy tube. A small amount of aspirated bile was capped and sent to the laboratory for analysis 7/17 Off sedation, PSV.  Beginning to follow some commands occasionally intermittently.  Very jaundiced.  Growing Candida in the blood and BAL > diflucan.   7/21 Ongoing fevers, WBC increased 7/22 Persistent fevers, pan cultured with CT Chest/Head. Suspected source tracheobronchitis.  7/28 Transitioned to eliquis, jaundiced but labs improving  7/29 Agitation/restless.  Trazodone added QHS. Weaning midodrine, attempting diuresis  7/30 Increased Na, weaning on PSV.  8/01 On 40% ATC, significantly improved mental status 8/1>> ongoing attempts at trach weaning complicated by weakness, secretion management, and anxiety 8/8 started back on meropenem and milrinone 8/12 patient making slow improvements, he has been able to tolerate short course ATC 8/13 Weaned off milrinone 8/14 Code blue called for hypoxemia leading to PEA arrest. ROSC achieved after 6 min. 8/15: In  morning patient is unfazed from his recent events and continues asking for water to drink. Awake, alert and follows commands. 8/16 seen by surg per wound care recs-->"Do not recommend any acute surgical intervention. Will ultimately defer wound care recommendations to the wound care team, but I think it is reasonable to decrease hydrotherapy to 3x per week. I would increase dressing changes to BID and continue santyl for enzymatic debridement". 8/17: Using albumin and Lasix to try to mobilize anasarca 8/18: Tracheostomy downsized to 7.5.  Attempting short trials of aerosol trach collar 8/19-8/20 Now in a negative fluid balance.  Sodium continuing to rise.  Holding diuretics continuing to work on physical therapy and reconditioning, got a unit of blood in am for hgb 7.3. F/U hgb was 6.8, had scant amt  of bloody tracheal out-put but not enough to account for hgb loss. No frank blood in stools. Got 2 more units of PRBCs. Hgb now  9.7. w/out obvious source. Complaining of more pain than last few days. More restless. Stopped arixtra again. Added fent patch for pain needs.   Consults:  Advanced Heart Failure PCCM TCTS  Procedures:  LUE PICC 7/5 >>7/21 R Brachial ALine 7/9 >>7/27 Chest Tubes >> out L IJ CVC 7/21 >>7/27 Peg tube 7/27 >> Tracheostomy downsized to 7.5 on 8/18  Significant Diagnostic Tests:  6/22 spirometry with restrictive physiology, preserved FEV1/FVC 6/18 echo: LVEF 72-53%, grade I diastolic dysfunction 6/64 US Abdomen cholelithiasis without cholecystitis. Mild wall thinking in setting of liver disease and ascites.  7/10 LE [place] negative for DVT  7/20 CT ABD w contrast >> stable position of the percutaneous cholecystostomy tube with complete decompression of the gallbladder, trace residual free fluid within the RUQ, decreased since prior, trace bilateral pleural effusions L>R, scattered areas of ground glass airspace disease within the RML, RLL 7/22 CT Chest w/o Contrast >> Retrosternal fluid collection, non specific.  Bilateral pleural effusions, portions loculated.   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 Stable bilateral mastoid and middle ear effusions.   Micro Data:  7/3 RQ> Few candida tropicalis, likely contaminant 7/12 BAL Cx> Candida tropicalis and albicans, likely contaminant 7/14 BCx >> candida parapsilosis 7/17 BCx >> neg resp 8/7 >> stenotrophomonas resp 8/26>   Antimicrobials:  Meropenem 8/8 >>8/10 Diflucan  7/16 >> 8/9 vanc 7/22 >>8/10 levaquin 8/10 >> 8/23  Interim history/subjective:  Concern for tube feeds being suctioned from trach overnight.   Objective   Blood pressure (!) 136/57, pulse 82, temperature 97.9 F (36.6 C), temperature source Oral, resp. rate (!) 24, height _0  (1.753 m), weight 73.4 kg, SpO2 100 %.    Vent Mode: PSV;CPAP FiO2 (%):  [40 %] 40 % Set Rate:  [20 bmp] 20  bmp Vt Set:  [420 mL] 420 mL PEEP:  [5 cmH20] 5 cmH20 Pressure Support:  [14 cmH20] 14 cmH20 Plateau Pressure:  [15 cmH20-25 cmH20] 15 cmH20   Intake/Output Summary (Last 24 hours) at 10/04/2019 1245 Last data filed at 10/04/2019 1200 Gross per 24 hour  Intake 1601 ml  Output 1655 ml  Net -54 ml   Filed Weights   10/02/19 0500 10/03/19 0500 10/04/19 0500  Weight: 76 kg 72.2 kg 73.4 kg    Examination: General: frail appearing elderly man laying in bed in NAD, trached on MV  HENT: [place]/AT, eyes anicteric Lungs: diffuse rhonchi, small amount of TF suctioned from trach Cardiovascular: RRR, no murmurs Abdomen: soft, NT, ND Extremities: cachexia, pitting LE edema Neuro: awake, tracking, following commands but globally very weak  Resolved Hospital Problem list   Suspected HCAP Acute kidney injury Hypernatremia Acute Metabolic Encephalopathy  -CTH without acute intracranial pathology. EEG negative 7/23.  Shock Heparin-induced thrombocytopenia >> resolved, required bivalirudin 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 -Opthalmology evaluated , diflucan x 3 weeks PEA arrest 8/14 preceded by hypoxemia Assessment & Plan:  Acute hypoxic respiratory failure requiring mechanical ventilation with severe weakness/deconditioning Tracheostomy dependence; sedation minimized yesterday for concerns of hypoventilation; RR improved this morning - Continue full vent support with weaning trials as able. Tolerating 14 PS today. - Titrate FiO2 as able to maintain SpO2 >90% - Routine trach care - VAP prevention protocol -holding TF -con't CPT  Hypervolemia with pulmonary congestion Improving with diuresis;  was able to tolerate pressure support for ~4 hours yesterday.  - Lasix 44m BID - Strict I&Os - Monitor renal function   Cardiogenic/hemorrhagic shock s/p CABG complicated by VT arrest. Shock resolved. S/p VA ECMO (decannulated 6/28), s/p impella placement (6/28>7/9) Atrial flutter,  reverted to NSR - con't amiodarone  Hyperbilirubinemia 2/2 acalculous cholecystitis vs drug induced cholestasis S/p percutaneous GB drain by IR on 7/17 - Trending intermittent LFTs  - GB drain to stay in place 6-8 weeks  - consider repeat RUQ [place] Diabetes mellitus II highly sensitive to insulin CBGs in 70's. Will decrease levemir to 5u bid and hold tube feed novolog. - Continue levemir with SSI. Levemir increased again today. Recently had hypoglycemia. - CBG goal 140- 180 while admitted to ICU.  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 minimal output in biliary drain - would not account for degree of Hb drop. Suspect possible GI bleed given worsening azotemia.  - GI consultation for further evaluation, discussed with TCTS - Will continue to trend Hb; transfuse as needed for Hb <7; will administer 2 units today. -FOBT pending - Unclear source of blood loss at this time - assumed UGI.  - Will con't to hold arixtra for now   Azotemia  Worsening BUN- presume this is due to UGIB rather than renal dysfunction. - Continue to trend renal function   Best practice:  Diet: tube feeds Pain/Anxiety/Delirium protocol (if indicated): per protocol VAP protocol (if indicated): per protocol DVT prophylaxis: Arixtra  GI prophylaxis: PPI Glucose control: basal bolus insulin Mobility: PT/OT as able  Code Status: FULL Family Communication: per primary Disposition: ICU for vent management and weaning - awaiting LTACH placement   This patient is critically ill with multiple organ system failure which requires frequent high complexity decision making, assessment, support, evaluation, and titration of therapies. This was completed through the application of advanced monitoring technologies and extensive interpretation of multiple databases. During this encounter critical care time was devoted to patient care services described in this  note for 39 minutes.   LJulian Hy DO 10/04/19 1:13 PM [facility]

## 2019-10-04 NOTE — Progress Notes (Signed)
Bilat arms wrapped with kerlix for protection of his skin. Numerous skin tears noted on fragile skin, multiple areas noted to be ecchymotic and or bleeding. Hands free and not wrapped, elbows able to bend freely.

## 2019-10-04 NOTE — Consult Note (Signed)
[facility] Consultation Note  Referring Provider: CVTS Primary Care Physician:  Bonnita Nasuti, MD  Reason for Consultation:  Anemia, HPS  HPI: Todd [name] is a 64 y.o. male we were asked to see for anemia and HPS.  He has history of ICM + CABG and subsequent VT arrest requiring ECMO.  Hospital course complicated by tracheostomy due to failure to wean from ventilator as well as fungemia.  Patient also had presumed acalculous cholecystitis and has PTC drain in place.  Patient reports (nods) to abdominal pain but otherwise unable to communicate due to being on ventilator.  Patient has flexiseal and has brown stool; no reported blood through PEG, hematemesis, melena, hematochezia.   Past Medical History:  Diagnosis Date  . Arthritis   . CHF (congestive heart failure) ([place])   . Depression   . Diabetes mellitus without complication ([place])   . Hard of hearing   . Hyperlipidemia   . Hypertension     Past Surgical History:  Procedure Laterality Date  . APPENDECTOMY  1995  . APPLICATION OF WOUND VAC N/A 08/10/2019   Procedure: APPLICATION OF WOUND VAC;  Surgeon: Wonda Olds, MD;  Location: [facility];  Service: Thoracic;  Laterality: N/A;  Prevena dressing to wound vac  . CANNULATION FOR ECMO (EXTRACORPOREAL MEMBRANE OXYGENATION) N/A 08/03/2019   Procedure: CANNULATION FOR ECMO (EXTRACORPOREAL MEMBRANE OXYGENATION);  Surgeon: Melrose Nakayama, MD;  Location: [place];  Service: Open Heart Surgery;  Laterality: N/A;  . CANNULATION FOR ECMO (EXTRACORPOREAL MEMBRANE OXYGENATION) N/A 08/06/2019   Procedure: DECANNULATION FOR ECMO (EXTRACORPOREAL MEMBRANE OXYGENATION);  Surgeon: Wonda Olds, MD;  Location: [place];  Service: Open Heart Surgery;  Laterality: N/A;  . CAROTID ENDARTERECTOMY  2010  . CORONARY ARTERY BYPASS GRAFT N/A 08/02/2019   Procedure: CORONARY ARTERY BYPASS GRAFTING (CABG), ON PUMP, TIMES FOUR, USING LEFT INTERNAL MAMMARY, LEFT RADIAL ARTERY, AND ENDOSCOPICALLY  HARVESTED RIGHT GREATER SAPHENOUS VEIN;  Surgeon: Melrose Nakayama, MD;  Location: [place];  Service: Open Heart Surgery;  Laterality: N/A;  . EXPLORATION POST OPERATIVE OPEN HEART N/A 08/07/2019   Procedure: MEDIASTINAL EXPLORATION POST OPERATIVE OPEN HEART IN 2H02;  Surgeon: Wonda Olds, MD;  Location: [place];  Service: Open Heart Surgery;  Laterality: N/A;  Bedside procedure 2H02/emergency  . IR GASTROSTOMY TUBE MOD SED  09/04/2019  . PLACEMENT OF IMPELLA LEFT VENTRICULAR ASSIST DEVICE Right 08/06/2019   Procedure: PLACEMENT OF IMPELLA LEFT VENTRICULAR ASSIST DEVICE;  Surgeon: Wonda Olds, MD;  Location: [place];  Service: Open Heart Surgery;  Laterality: Right;  . RADIAL ARTERY HARVEST Left 08/02/2019   Procedure: LEFT RADIAL ARTERY HARVEST;  Surgeon: Melrose Nakayama, MD;  Location: [place];  Service: Open Heart Surgery;  Laterality: Left;  . REMOVAL OF IMPELLA LEFT VENTRICULAR ASSIST DEVICE N/A 08/17/2019   Procedure: REMOVAL OF [place] LEFT VENTRICULAR ASSIST DEVICE;  Surgeon: Melrose Nakayama, MD;  Location: [place];  Service: Open Heart Surgery;  Laterality: N/A;  . RIGHT/LEFT HEART CATH AND CORONARY ANGIOGRAPHY N/A 07/30/2019   Procedure: RIGHT/LEFT HEART CATH AND CORONARY ANGIOGRAPHY;  Surgeon: Burnell Blanks, MD;  Location: [place] [facility];  Service: Cardiovascular;  Laterality: N/A;  . STERNAL CLOSURE N/A 08/10/2019   Procedure: STERNAL CLOSURE;  Surgeon: Wonda Olds, MD;  [facility];  Service: Thoracic;  Laterality: N/A;  . TEE WITHOUT CARDIOVERSION N/A 08/02/2019   Procedure: TRANSESOPHAGEAL ECHOCARDIOGRAM (TEE);  Surgeon: Melrose Nakayama, MD;  Location: [place];  Service:  Open Heart Surgery;  Laterality: N/A;  . TEE WITHOUT CARDIOVERSION N/A 08/06/2019   Procedure: TRANSESOPHAGEAL ECHOCARDIOGRAM (TEE);  Surgeon: Wonda Olds, MD;  Location: [place];  Service: Open Heart Surgery;  Laterality: N/A;  . TEE WITHOUT CARDIOVERSION N/A 08/10/2019    Procedure: TRANSESOPHAGEAL ECHOCARDIOGRAM (TEE);  Surgeon: Wonda Olds, MD;  Location[facility];  Service: Thoracic;  Laterality: N/A;  . TEE WITHOUT CARDIOVERSION N/A 08/17/2019   Procedure: TRANSESOPHAGEAL ECHOCARDIOGRAM (TEE);  Surgeon: Melrose Nakayama, MD;  Location: [place];  Service: Open Heart Surgery;  Laterality: N/A;  . WOUND DEBRIDEMENT N/A 08/03/2019   Procedure: DEBRIDEMENT WOUND;  Surgeon: Melrose Nakayama, MD;  Location: [facility] OR;  Service: Vascular;  Laterality: N/A;    Prior to Admission medications   Medication Sig Start Date End Date Taking? Authorizing Provider  citalopram (CELEXA) 40 MG tablet Take 40 mg by mouth daily.   Yes [provider]  dapagliflozin propanediol (FARXIGA) 10 MG TABS tablet Take 10 mg by mouth daily.   Yes [provider]  ergocalciferol (VITAMIN D2) 1.25 MG (50000 UT) capsule Take 50,000 Units by mouth 2 (two) times a week.   Yes [provider]  furosemide (LASIX) 20 MG tablet Take 40 mg by mouth daily. 07/16/19  Yes [provider]  glipiZIDE-metformin (METAGLIP) 2.5-500 MG tablet Take 2 tablets by mouth 2 (two) times daily with a meal.   Yes [provider]  ibuprofen (ADVIL) 200 MG tablet Take 400 mg by mouth 2 (two) times daily as needed for moderate pain.   Yes [provider]  levofloxacin (LEVAQUIN) 750 MG tablet Take 750 mg by mouth daily. For 14 days.   Yes [provider]  lisinopril (ZESTRIL) 20 MG tablet Take 20 mg by mouth daily.   Yes [provider]  oxyCODONE (ROXICODONE) 15 MG immediate release tablet Take 15 mg by mouth 3 (three) times daily.   Yes [provider]  pioglitazone (ACTOS) 45 MG tablet Take 45 mg by mouth daily.   Yes [provider]  promethazine (PHENERGAN) 25 MG tablet Take 25 mg by mouth 2 (two) times daily as needed for nausea or vomiting.   Yes [provider]    Current Facility-Administered Medications   Medication Dose Route Frequency Provider Last Rate Last Admin  . 0.9 %  sodium chloride infusion (Manually program via Guardrails IV Fluids)   Intravenous Once Candee Furbish, MD   Stopped at 09/13/19 1600  . 0.9 %  sodium chloride infusion (Manually program via Guardrails IV Fluids)   Intravenous Once Ogan, [place]oron[place]wo U, MD      . 0.9 %  sodium chloride infusion (Manually program via Guardrails IV Fluids)   Intravenous Once Anders Simmonds, MD      . acetaminophen (TYLENOL) 160 MG/5ML solution 650 mg  650 mg Per Tube Q6H PRN Melrose Nakayama, MD   650 mg at 10/02/19 2008  . amiodarone (PACERONE) tablet 200 mg  200 mg Per Tube Daily Larey Dresser, MD   200 mg at 10/04/19 0816  . aspirin chewable tablet 81 mg  81 mg Per Tube Daily Melrose Nakayama, MD   81 mg at 10/04/19 0815  . chlorhexidine gluconate (MEDLINE KIT) (PERIDEX) 0.12 % solution 15 mL  15 mL Mouth Rinse BID Melrose Nakayama, MD   15 mL at 10/04/19 0819  . Chlorhexidine Gluconate Cloth 2 % PADS 6 each  6 each Topical Daily Kipp Brood, MD  6 each at 10/03/19 1510  . clonazePAM (KLONOPIN) tablet 0.5 mg  0.5 mg Per Tube BID Noemi Chapel P, DO   0.5 mg at 10/04/19 0816  . collagenase (SANTYL) ointment   Topical BID Melrose Nakayama, MD   Given at 10/03/19 2048  . dextrose 50 % solution 0-50 mL  0-50 mL Intravenous PRN Melrose Nakayama, MD   50 mL at 08/23/19 0751  . feeding supplement (PIVOT 1.5 CAL) liquid 1,000 mL  1,000 mL Per Tube Continuous Melrose Nakayama, MD 70 mL/hr at 10/04/19 0841 1,000 mL at 10/04/19 0841  . fentaNYL (DURAGESIC) 75 MCG/HR 1 patch  1 patch Transdermal Q72H Erick Colace, NP   1 patch at 10/04/19 0825  . fentaNYL (SUBLIMAZE) injection 50 mcg  50 mcg Intravenous Q3H PRN Merlene Laughter F, NP   50 mcg at 09/30/19 3875  . FLUoxetine (PROZAC) 20 MG/5ML solution 20 mg  20 mg Per Tube Daily Candee Furbish, MD   20 mg at 10/04/19 0816  . furosemide (LASIX) injection 40 mg  40 mg  Intravenous BID Melrose Nakayama, MD      . Gerhardt's butt cream   Topical PRN Kipp Brood, MD   Given at 09/11/19 2130  . insulin aspart (novoLOG) injection 0-9 Units  0-9 Units Subcutaneous Q4H Melrose Nakayama, MD   7 Units at 10/04/19 1134  . insulin detemir (LEVEMIR) injection 10 Units  10 Units Subcutaneous BID Melrose Nakayama, MD      . ipratropium-albuterol (DUONEB) 0.5-2.5 (3) MG/3ML nebulizer solution 3 mL  3 mL Nebulization TID Melrose Nakayama, MD   3 mL at 10/04/19 0751  . lidocaine (LIDODERM) 5 % 1 patch  1 patch Transdermal Q24H Candee Furbish, MD   1 patch at 10/03/19 1510  . lip balm (CARMEX) ointment   Topical PRN Melrose Nakayama, MD   Given at 09/12/19 [phone]  . MEDLINE mouth rinse  15 mL Mouth Rinse 10 times per day Melrose Nakayama, MD   15 mL at 10/04/19 1115  . metoprolol tartrate (LOPRESSOR) injection 2.5-5 mg  2.5-5 mg Intravenous Q2H PRN Melrose Nakayama, MD      . midodrine (PROAMATINE) tablet 5 mg  5 mg Per Tube TID WC Larey Dresser, MD   5 mg at 10/04/19 1133  . nutrition supplement (JUVEN) (JUVEN) powder packet 1 packet  1 packet Per Tube BID BM Melrose Nakayama, MD   1 packet at 10/04/19 [phone]  . ondansetron (ZOFRAN) injection 4 mg  4 mg Intravenous Q6H PRN Melrose Nakayama, MD   4 mg at 10/01/19 8841  . oxyCODONE (Oxy IR/ROXICODONE) immediate release tablet 5-10 mg  5-10 mg Oral Q3H PRN Merlene Laughter F, NP   10 mg at 10/01/19 2143  . pantoprazole (PROTONIX) injection 40 mg  40 mg Intravenous Q12H Melrose Nakayama, MD   40 mg at 10/04/19 0816  . QUEtiapine (SEROQUEL) tablet 50 mg  50 mg Oral QHS Candee Furbish, MD   50 mg at 10/03/19 2057  . silver nitrate applicators applicator 1 Stick  1 Stick Topical PRN Melrose Nakayama, MD      . sodium chloride flush (NS) 0.9 % injection 10-40 mL  10-40 mL Intracatheter Q12H Melrose Nakayama, MD   10 mL at 10/04/19 0820  . sodium chloride flush (NS) 0.9 %  injection 10-40 mL  10-40 mL Intracatheter PRN Melrose Nakayama, MD   10  mL at 10/03/19 1440  . sodium chloride flush (NS) 0.9 % injection 3 mL  3 mL Intravenous Q12H Melrose Nakayama, MD   3 mL at 10/03/19 2057  . sodium chloride flush (NS) 0.9 % injection 5 mL  5 mL Intracatheter Q8H Melrose Nakayama, MD   5 mL at 10/04/19 0501  . sodium chloride HYPERTONIC 3 % nebulizer solution 4 mL  4 mL Nebulization BID Melrose Nakayama, MD   4 mL at 10/04/19 0751  . testosterone (ANDROGEL) 50 MG/5GM (1%) gel 5 g  5 g Transdermal Daily Kipp Brood, MD   5 g at 10/04/19 0820  . thiamine tablet 100 mg  100 mg Per Tube Daily Melrose Nakayama, MD   100 mg at 10/04/19 0816  . traZODone (DESYREL) tablet 50 mg  50 mg Per Tube QHS PRN Melrose Nakayama, MD   50 mg at 09/28/19 0142    Allergies as of 07/26/2019 - Review Complete 07/26/2019  Allergen Reaction Noted  . Empagliflozin  07/23/2019  . Other  07/23/2019  . Simvastatin  07/23/2019  . Sitagliptin  07/23/2019    Family History  Problem Relation Age of Onset  . Heart disease Mother   . Hypertension Mother     Social History   Socioeconomic History  . Marital status: Unknown    Spouse name: Not on file  . Number of children: Not on file  . Years of education: Not on file  . Highest education level: Not on file  Occupational History  . Not on file  Tobacco Use  . Smoking status: Current Every Day Smoker    Packs/day: 2.50    Types: Cigarettes  . Smokeless tobacco: Never Used  Substance and Sexual Activity  . Alcohol use: Not on file  . Drug use: Yes    Frequency: 7.0 times per week    Types: Marijuana  . Sexual activity: Not on file  Other Topics Concern  . Not on file  Social History Narrative  . Not on file   Social Determinants of Health   Financial Resource Strain:   . Difficulty of Paying Living Expenses: Not on file  Food Insecurity:   . Worried About Charity fundraiser in the Last Year:  Not on file  . Ran Out of Food in the Last Year: Not on file  Transportation Needs:   . Lack of Transportation (Medical): Not on file  . Lack of Transportation (Non-Medical): Not on file  Physical Activity:   . Days of Exercise per Week: Not on file  . Minutes of Exercise per Session: Not on file  Stress:   . Feeling of Stress : Not on file  Social Connections:   . Frequency of Communication with Friends and Family: Not on file  . Frequency of Social Gatherings with Friends and Family: Not on file  . Attends Religious Services: Not on file  . Active Member of Clubs or Organizations: Not on file  . Attends Archivist Meetings: Not on file  . Marital Status: Not on file  Intimate Partner Violence:   . Fear of Current or Ex-Partner: Not on file  . Emotionally Abused: Not on file  . Physically Abused: Not on file  . Sexually Abused: Not on file    Review of Systems: Unable to obtain due to ventilator  Physical Exam: Vital signs in last 24 hours: Temp:  [97.6 F (36.4 C)-98.7 F (37.1 C)] 97.9 F (36.6 C) (08/26  1135) Pulse Rate:  [25-89] 83 (08/26 1135) Resp:  [15-30] 27 (08/26 1135) BP: (97-130)/(48-89) 128/62 (08/26 1135) SpO2:  [91 %-100 %] 100 % (08/26 1135) FiO2 (%):  [40 %-50 %] 40 % (08/26 1101) Weight:  [73.4 kg] 73.4 kg (08/26 0500) Last BM Date: 10/03/19 General:   Awake, on ventilator via tracheostomy, diaphoretic Head:  Normocephalic and atraumatic. Eyes:  Sclera clear, no icterus.   Conjunctiva pink. Ears:  Normal auditory acuity. Nose:  No deformity, discharge,  or lesions. Mouth:  No deformity or lesions.  Oropharynx dry Neck:  Supple; no masses or thyromegaly. Abdomen:  Soft, mild protuberant, no appreciable tenderness, PEG tube and PTC drains in place. No masses, hepatosplenomegaly or hernias noted.    Msk:  Symmetrical without gross deformities. Normal posture. Pulses:  Normal pulses noted. Extremities:  Without clubbing or edema. Neurologic:   Awake, nods to some question Skin:  Scattered ecchymoses arms/legs, note of sacral decubiti per chart. Otherwise Intact without significant lesions or rashes.  Psych:  Awake, on ventilator   Lab Results: Recent Labs    10/02/19 0408 10/03/19 0451 10/04/19 0434  WBC 11.8* 12.0* 11.5*  HGB 8.4* 7.0* 6.8*  HCT 28.4* 23.8* 22.6*  PLT 182 170 169   BMET Recent Labs    10/02/19 0408 10/03/19 0451 10/04/19 0434  NA 139 140 143  K 3.9 4.3 4.3  CL 97* 100 99  CO2 35* 34* 36*  GLUCOSE 87 203* 370*  BUN 121* 134* 148*  CREATININE 1.13 1.15 1.25*  CALCIUM 7.9* 7.7* 8.0*   LFT Recent Labs    10/04/19 0434  PROT 5.9*  ALBUMIN 1.6*  AST 97*  ALT 81*  ALKPHOS 713*  BILITOT 5.4*   PT/INR No results for input(s): LABPROT, INR in the last 72 hours.  Studies/Results: DG Chest Port 1 View  Result Date: 10/04/2019 CLINICAL DATA:  Tracheostomy tube, respiratory failure EXAM: PORTABLE CHEST 1 VIEW COMPARISON:  10/02/2019 FINDINGS: Tracheostomy tube in place. RIGHT-sided PICC line in place as well with similar position terminating at the caval to atrial junction. Signs of median sternotomy and CABG. Dense consolidative changes in the LEFT lung base with obscured LEFT hemidiaphragm likely with enlarged pleural effusion in the LEFT chest compared to the prior study. Increased interstitial markings airspace disease scattered about the chest with similar appearance. On limited assessment visualized skeletal structures without acute process. IMPRESSION: 1. Dense consolidative changes in the LEFT lung base with enlarged LEFT pleural effusion compared to the prior study. Findings may represent combination of effusion and volume loss or worsening of pneumonia. 2. Increased interstitial markings scattered about the chest may represent multifocal infection or edema. Suggest follow-up to ensure resolution. Electronically Signed   By: Zetta Bills M.D.   On: 10/04/2019 07:55    Impression:  1.   Multiple medical problems, outlined above. 2.  Acalculous cholecystitis with PTC. 3.  Elevated LFTs; unchanged for the past several days. 4.  Anemia, HPS, no overt GI bleeding.  Plan:  1.  PPI. 2.  Patient is in absolutely no shape for endoscopy.  Would not pursue any type of endoscopy unless under emergency situation.  Would manage anemia supportively with CBCs and transfusions as needed. 3.  Regarding patient's reports of abdominal pain and history of PTC, if patient has ongoing patient could consider CT scan, but he is not stable enough in my opinion to go to CT scanner at this time, and portable ultrasound would not be very helpful given the  presence of indwelling PTC drain. 4.  [facility] will follow along at a distance; please call with any further questions.   LOS: 70 days   Jeaninne Lodico M  10/04/2019, 11:46 AM  Cell [phone] If no answer or after 5 PM call [phone]

## 2019-10-05 ENCOUNTER — Other Ambulatory Visit (HOSPITAL_COMMUNITY): Payer: Medicare Other

## 2019-10-05 ENCOUNTER — Inpatient Hospital Stay (HOSPITAL_COMMUNITY): Payer: Medicare Other

## 2019-10-05 LAB — COMPREHENSIVE METABOLIC PANEL
ALT: 77 U/L — ABNORMAL HIGH (ref 0–44)
AST: 88 U/L — ABNORMAL HIGH (ref 15–41)
Albumin: 1.6 g/dL — ABNORMAL LOW (ref 3.5–5.0)
Alkaline Phosphatase: 702 U/L — ABNORMAL HIGH (ref 38–126)
Anion gap: 7 (ref 5–15)
BUN: 141 mg/dL — ABNORMAL HIGH (ref 8–23)
CO2: 37 mmol/L — ABNORMAL HIGH (ref 22–32)
Calcium: 8.2 mg/dL — ABNORMAL LOW (ref 8.9–10.3)
Chloride: 105 mmol/L (ref 98–111)
Creatinine, Ser: 1.17 mg/dL (ref 0.61–1.24)
GFR calc Af Amer: >60 mL/min (ref >60)
GFR calc non Af Amer: >60 mL/min (ref >60)
Glucose, Bld: 122 mg/dL — ABNORMAL HIGH (ref 70–99)
Potassium: 4 mmol/L (ref 3.5–5.1)
Sodium: 149 mmol/L — ABNORMAL HIGH (ref 135–145)
Total Bilirubin: 6.2 mg/dL — ABNORMAL HIGH (ref 0.3–1.2)
Total Protein: 6 g/dL — ABNORMAL LOW (ref 6.5–8.1)

## 2019-10-05 LAB — GLUCOSE, CAPILLARY
Glucose-Capillary: 109 mg/dL — ABNORMAL HIGH (ref 70–99)
Glucose-Capillary: 112 mg/dL — ABNORMAL HIGH (ref 70–99)
Glucose-Capillary: 121 mg/dL — ABNORMAL HIGH (ref 70–99)
Glucose-Capillary: 124 mg/dL — ABNORMAL HIGH (ref 70–99)
Glucose-Capillary: 127 mg/dL — ABNORMAL HIGH (ref 70–99)
Glucose-Capillary: 176 mg/dL — ABNORMAL HIGH (ref 70–99)

## 2019-10-05 LAB — TYPE AND SCREEN
ABO/RH(D): A POS
Antibody Screen: NEGATIVE
Unit division: 0
Unit division: 0

## 2019-10-05 LAB — CBC
HCT: 27.3 % — ABNORMAL LOW (ref 39.0–52.0)
Hemoglobin: 8.5 g/dL — ABNORMAL LOW (ref 13.0–17.0)
MCH: 28.6 pg (ref 26.0–34.0)
MCHC: 31.1 g/dL (ref 30.0–36.0)
MCV: 91.9 fL (ref 80.0–100.0)
Platelets: 184 10*3/uL (ref 150–400)
RBC: 2.97 MIL/uL — ABNORMAL LOW (ref 4.22–5.81)
RDW: 21.2 % — ABNORMAL HIGH (ref 11.5–15.5)
WBC: 15.1 10*3/uL — ABNORMAL HIGH (ref 4.0–10.5)
nRBC: 0 % (ref 0.0–0.2)

## 2019-10-05 LAB — BPAM RBC
Blood Product Expiration Date: 202109022359
Blood Product Expiration Date: 202109072359
ISSUE DATE / TIME: 202108261112
ISSUE DATE / TIME: 202108261502
Unit Type and Rh: 6200
Unit Type and Rh: 6200

## 2019-10-05 LAB — BRAIN NATRIURETIC PEPTIDE: B Natriuretic Peptide: 2886 pg/mL — ABNORMAL HIGH (ref 0.0–100.0)

## 2019-10-05 MED ORDER — LEVOFLOXACIN IN D5W 750 MG/150ML IV SOLN
750.0000 mg | INTRAVENOUS | Status: DC
  Administered 2019-10-05 – 2019-10-07 (×3): 750 mg via INTRAVENOUS
  Filled 2019-10-05 (×3): qty 150

## 2019-10-05 NOTE — Progress Notes (Signed)
Patient ID: Todd [name], male   DOB: [birth year], 64 y.o.   MRN: [id number] EVENING ROUNDS NOTE :     [address]       [place] [address]             [phone]                 49 Days Post-Op Procedure(s) (LRB): REMOVAL OF IMPELLA LEFT VENTRICULAR ASSIST DEVICE (N/A) TRANSESOPHAGEAL ECHOCARDIOGRAM (TEE) (N/A)  Total Length of Stay:  LOS: 71 days  BP (!) 119/56   Pulse 76   Temp 98.1 F (36.7 C)   Resp 16   Ht 5\' 9"  (1.753 m)   Wt 75.4 kg   SpO2 98%   BMI 24.55 kg/m   .Intake/Output      08/26 0701 - 08/27 0700 08/27 0701 - 08/28 0700   I.V. (mL/kg) 0 (0)    Blood 756    Other     NG/GT 490 0   Total Intake(mL/kg) 1246 (16.5) 0 (0)   Urine (mL/kg/hr) 2110 (1.2) 1450 (1.9)   Stool 325    Total Output 2435 1450   Net -1189 -1450          . feeding supplement (PIVOT 1.5 CAL) Stopped (10/04/19 1300)  . levofloxacin (LEVAQUIN) IV       Lab Results  Component Value Date   WBC 15.1 (H) 10/05/2019   HGB 8.5 (L) 10/05/2019   HCT 27.3 (L) 10/05/2019   PLT 184 10/05/2019   GLUCOSE 122 (H) 10/05/2019   CHOL 203 (H) 07/27/2019   TRIG 213 (H) 08/27/2019   HDL 34 (L) 07/27/2019   LDLCALC 148 (H) 07/27/2019   ALT 77 (H) 10/05/2019   AST 88 (H) 10/05/2019   NA 149 (H) 10/05/2019   K 4.0 10/05/2019   CL 105 10/05/2019   CREATININE 1.17 10/05/2019   BUN 141 (H) 10/05/2019   CO2 37 (H) 10/05/2019   TSH 2.243 07/31/2019   INR 1.3 (H) 09/27/2019   HGBA1C 5.3 09/25/2019   Getting right upper quad 09/27/2019, gale bladder tube clamped    [place] MD  Beeper [phone] Office [phone] 10/05/2019 5:24 PM

## 2019-10-05 NOTE — Progress Notes (Signed)
NAME:  Todd [name], MRN:  [id number], DOB:  Jun 11, 1955, LOS: 70 ADMISSION DATE:  07/26/2019, CONSULTATION DATE:  08/03/2019 REFERRING MD:  Roxan Hockey, CHIEF COMPLAINT:  ECMO    Brief History   64 year old male with history of ischemic cardiomyopathy admitted with CHF exacerbation, found to have multi-vessel disease requiring CABG 6/24, post op VT arrest 6/25 and cannulated for VA ECMO. S/p decannulation and impella. Hospital course has been complicated by cholecystitis, candidemia, and chronic hypoxic respiratory failure requiring tracheostomy.   Past Medical History   Past Medical History:  Diagnosis Date  . Arthritis   . CHF (congestive heart failure) ([place])   . Depression   . Diabetes mellitus without complication ([place])   . Hard of hearing   . Hyperlipidemia   . Hypertension    Significant Hospital Events   6/24 CABG 6/25 VA cannulation 6/28 decannulated and impella placed 6/29 bedside re-exploration; s/p decannulation. Placement of impella device originally at p8 but decreased to p4 2/2 suction events overnight up to p6. Echo completed and repositioned device. Remained on considerable support with 8 epi and 46 norepi vaso 0.05. continued chest tube output with large clots noted. Heparin thru device. Replacement products ongoing. 60% 8 peep 6/30 bedside mediastinal re-exploration and clean out of hematoma with tamponade and worsening hemodynamics. Pt had large volume transfusion. rebolused with amio 2/2 nsvt episode.  Weight up 48 pounds.  Started diuresis, Lasix drip started. 7/01 iatrogenic respiratory alkalosis, vent rate decreased. 7/02 No significant issues overnight remains on pressors and Impella not tolerating tube feeds with high gastric output awaiting core track placement 7/03 started trickle TF  7/05 Swab removed, CVL and PICC placed. 7/13 Trach and BAL 7/16 Awake and tracking. No follow commands. Still having fevers. Successful ultrasound and CT guided placement of a  10.2 French cholecystostomy tube. A small amount of aspirated bile was capped and sent to the laboratory for analysis 7/17 Off sedation, PSV.  Beginning to follow some commands occasionally intermittently.  Very jaundiced.  Growing Candida in the blood and BAL > diflucan.   7/21 Ongoing fevers, WBC increased 7/22 Persistent fevers, pan cultured with CT Chest/Head. Suspected source tracheobronchitis.  7/28 Transitioned to eliquis, jaundiced but labs improving  7/29 Agitation/restless.  Trazodone added QHS. Weaning midodrine, attempting diuresis  7/30 Increased Na, weaning on PSV.  8/01 On 40% ATC, significantly improved mental status 8/1>> ongoing attempts at trach weaning complicated by weakness, secretion management, and anxiety 8/8 started back on meropenem and milrinone 8/12 patient making slow improvements, he has been able to tolerate short course ATC 8/13 Weaned off milrinone 8/14 Code blue called for hypoxemia leading to PEA arrest. ROSC achieved after 6 min. 8/15: In  morning patient is unfazed from his recent events and continues asking for water to drink. Awake, alert and follows commands. 8/16 seen by surg per wound care recs-->"Do not recommend any acute surgical intervention. Will ultimately defer wound care recommendations to the wound care team, but I think it is reasonable to decrease hydrotherapy to 3x per week. I would increase dressing changes to BID and continue santyl for enzymatic debridement". 8/17: Using albumin and Lasix to try to mobilize anasarca 8/18: Tracheostomy downsized to 7.5.  Attempting short trials of aerosol trach collar 8/19-8/20 Now in a negative fluid balance.  Sodium continuing to rise.  Holding diuretics continuing to work on physical therapy and reconditioning, got a unit of blood in am for hgb 7.3. F/U hgb was 6.8, had scant amt  of bloody tracheal out-put but not enough to account for hgb loss. No frank blood in stools. Got 2 more units of PRBCs. Hgb now  9.7. w/out obvious source. Complaining of more pain than last few days. More restless. Stopped arixtra again. Added fent patch for pain needs.   Consults:  Advanced Heart Failure PCCM TCTS  Procedures:  LUE PICC 7/5 >>7/21 R Brachial ALine 7/9 >>7/27 Chest Tubes >> out L IJ CVC 7/21 >>7/27 Peg tube 7/27 >> Tracheostomy downsized to 7.5 on 8/18  Significant Diagnostic Tests:  6/22 spirometry with restrictive physiology, preserved FEV1/FVC 6/18 echo: LVEF 09-32%, grade I diastolic dysfunction 3/55 US Abdomen cholelithiasis without cholecystitis. Mild wall thinking in setting of liver disease and ascites.  7/10 LE [place] negative for DVT  7/20 CT ABD w contrast >> stable position of the percutaneous cholecystostomy tube with complete decompression of the gallbladder, trace residual free fluid within the RUQ, decreased since prior, trace bilateral pleural effusions L>R, scattered areas of ground glass airspace disease within the RML, RLL 7/22 CT Chest w/o Contrast >> Retrosternal fluid collection, non specific.  Bilateral pleural effusions, portions loculated.   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 Stable bilateral mastoid and middle ear effusions.   Micro Data:  7/3 RQ> Few candida tropicalis, likely contaminant 7/12 BAL Cx> Candida tropicalis and albicans, likely contaminant 7/14 BCx >> candida parapsilosis 7/17 BCx >> neg resp 8/7 >> stenotrophomonas resp 8/26> stenotrophomonas  Antimicrobials:  Meropenem 8/8 >>8/10 Diflucan  7/16 >> 8/9 vanc 7/22 >>8/10 levaquin 8/10 >> 8/23  Interim history/subjective:  Complaining of abdominal pain this morning. TF off this morning.  Objective   Blood pressure (!) 116/51, pulse 73, temperature 98.2 F (36.8 C), temperature source Oral, resp. rate 17, height _0  (1.753 m), weight 75.4 kg, SpO2 95 %.    Vent Mode: PRVC FiO2 (%):  [40 %] 40 % Set Rate:  [20  bmp] 20 bmp Vt Set:  [420 mL] 420 mL PEEP:  [5 cmH20] 5 cmH20 Pressure Support:  [14 cmH20] 14 cmH20 Plateau Pressure:  [10 cmH20-25 cmH20] 19 cmH20   Intake/Output Summary (Last 24 hours) at 10/05/2019 0858 Last data filed at 10/05/2019 0800 Gross per 24 hour  Intake 1246 ml  Output 2435 ml  Net -1189 ml   Filed Weights   10/03/19 0500 10/04/19 0500 10/05/19 0300  Weight: 72.2 kg 73.4 kg 75.4 kg    Examination: General: frail appearing elderly man, cachectic, chronically ill appearing HENT: [place]/AT, temporal wasting Neck: trach in place Lungs: Less rhonchi today, breathing comfortably on mechanical ventilation. Cardiovascular: Regular rate and rhythm Abdomen: Soft, tender to palpation in the right upper quadrant more than right lower quadrant, nondistended.  Clamped biliary drain Extremities: Normal muscle mass, pitting dependent edema Neuro: Awake, tracking, not nodding or shaking his head answer questions.  Globally very weak but following some commands.  Resolved Hospital Problem list   Suspected HCAP Acute kidney injury Hypernatremia Acute Metabolic Encephalopathy  -CTH without acute intracranial pathology. EEG negative 7/23.  Shock Heparin-induced thrombocytopenia >> resolved, required bivalirudin 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 -Opthalmology evaluated , diflucan x 3 weeks PEA arrest 8/14 preceded by hypoxemia Assessment & Plan:  Acute hypoxic respiratory failure requiring mechanical ventilation with severe weakness/deconditioning Tracheostomy dependence; sedation minimized yesterday for concerns of hypoventilation; RR improved this morning -Continue full vent support.  Pressure support trials daily for vent weaning. -Trach care per protocol -VAP  prevention protocol -Continue to follow respiratory culture sensitivities.  Growing stenotrophomonas again.  Restarting levofloxacin today. -Titrate down FiO2 as able to maintain SPO2 greater than  90% -Continue CPT  Hypervolemia with pulmonary congestion Improving with diuresis; was able to tolerate pressure support for ~4 hours yesterday.  -Continue Lasix 68m BID - Strict I&Os -Continue to monitor renal function  Cardiogenic/hemorrhagic shock s/p CABG complicated by VT arrest. Shock resolved. S/p VA ECMO (decannulated 6/28), s/p impella placement (6/28>7/9) Atrial flutter, reverted to NSR -Continue amiodarone  Hyperbilirubinemia 2/2 acalculous cholecystitis vs drug induced cholestasis S/p percutaneous GB drain by IR on 7/17 - Trending intermittent LFTs  -Right upper quadrant ultrasound today - GB drain to stay in place 6-8 weeks ; currently clamped  Diabetes mellitus II highly sensitive to insulin CBGs in 70's. Will decrease levemir to 5u bid and hold tube feed novolog. - Continue levemir with SSI. Levemir increased again today. Recently had hypoglycemia. - CBG goal 140- 180 while admitted to ICU.  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 minimal output in biliary drain - would not account for degree of Hb drop. Suspect possible GI bleed given worsening azotemia.  -GI consultation for further evaluation--too unstable for scope. -Continue to trend hemoglobin.  Transfuse hemoglobin < 7.  2 units given 8/26. -FOBT pending - Unclear source of blood loss at this time - assumed UGI.  - Will con't to hold arixtra for now   Azotemia  Worsening BUN- presume this is due to UGIB rather than renal dysfunction. -con't to monitor renal function and renally dose medications  Best practice:  Diet: tube feeds Pain/Anxiety/Delirium protocol (if indicated): per protocol VAP protocol (if indicated): per protocol DVT prophylaxis: SCDs GI prophylaxis: PPI BID Glucose control: basal bolus insulin Mobility: PT/OT as able  Code Status: FULL Family Communication: per primary Disposition: ICU for vent management and  weaning - awaiting LTACH placement   This patient is critically ill with multiple organ system failure which requires frequent high complexity decision making, assessment, support, evaluation, and titration of therapies. This was completed through the application of advanced monitoring technologies and extensive interpretation of multiple databases. During this encounter critical care time was devoted to patient care services described in this note for 32 minutes.   LJulian Hy DO 10/05/19 4:03 PM [facility]

## 2019-10-05 NOTE — Progress Notes (Signed)
Physical Therapy Wound Treatment Patient Details  Name: DOROTHY LANDGREBE MRN: [id number] Date of Birth: 1955/12/25  Today's Date: 10/05/2019 Time: 1029-1058 Time Calculation (min): 29 min  Subjective  Subjective: Pt awake, not responding to yes/no questions by therapist Patient and Family Stated Goals: unable  Date of Onset:  (unknown) Prior Treatments: foam dressing  Pain Score:  4/10  Wound Assessment  Pressure Injury 08/09/19 Sacrum Stage 4 - Full thickness tissue loss with exposed bone, tendon or muscle. has evolved into stage 4 when assessed on 8/9 (Active)  Wound Image   10/05/19 1108  Dressing Type ABD;Barrier Film (skin prep);Gauze (Comment);Moist to moist 10/05/19 1108  Dressing Changed;Dry;Clean;Intact 10/05/19 1108  Dressing Change Frequency Twice a day 10/05/19 1108  State of Healing Non-healing 10/05/19 1108  Site / Wound Assessment Pink;Yellow;Pale;Red 10/05/19 1108  % Wound base Red or Granulating 5% 10/03/19 1300  % Wound base Yellow/Fibrinous Exudate 30% 10/03/19 1300  % Wound base Black/Eschar 55% 10/03/19 1300  % Wound base Other/Granulation Tissue (Comment) 5% 10/03/19 1300  Peri-wound Assessment Erythema (blanchable) 10/05/19 1108  Wound Length (cm) 6.5 cm 10/03/19 1300  Wound Width (cm) 5.2 cm 10/03/19 1300  Wound Depth (cm) 2.3 cm 10/03/19 1300  Wound Surface Area (cm^2) 33.8 cm^2 10/03/19 1300  Wound Volume (cm^3) 77.74 cm^3 10/03/19 1300  Tunneling (cm) 7 cm at 5 o'clock 09/26/19 1500  Undermining (cm) 1.8 cm at 12 o'clock, 1.7 cm at 3 o'clock, 3.2 cm at 5 o'clock, 0.5 cm at 6 o'clock, 1.7 cm at 9 o'clock 10/03/19 1300  Margins Unattached edges (unapproximated) 10/05/19 1108  Drainage Amount Moderate 10/05/19 1108  Drainage Description Serosanguineous 10/05/19 1108  Treatment Debridement (Selective);Hydrotherapy (Pulse lavage);Packing (Saline gauze) 10/05/19 1108   Santyl applied to wound bed prior to applying dressing.    Hydrotherapy Pulsed lavage  therapy - wound location: sacrum Pulsed Lavage with Suction (psi): 12 psi Pulsed Lavage with Suction - Normal Saline Used: 1000 mL Pulsed Lavage Tip: Tip with splash shield Selective Debridement Selective Debridement - Location: sacrum Selective Debridement - Tools Used: Forceps;Scissors Selective Debridement - Tissue Removed: yellow/gray slough   Wound Assessment and Plan  Wound Therapy - Assess/Plan/Recommendations Wound Therapy - Clinical Statement: Worked on debriding yellow slough around edges of wound. Rest of tissue fairly adherent; continue to rely on santyl for enzymatic debridement. Pt continues to benefit from pulse lavage and selective debridement 3x/week. Wound Therapy - Functional Problem List: decreased mobility Factors Delaying/Impairing Wound Healing: Diabetes Mellitus;Incontinence;Immobility;Multiple medical problems Hydrotherapy Plan: Debridement;Dressing change;Patient/family education;Pulsatile lavage with suction Wound Therapy - Frequency: 3X / week Wound Therapy - Follow Up Recommendations: Other (comment) (LTACH) Wound Plan: see above  Wound Therapy Goals- Improve the function of patient's integumentary system by progressing the wound(s) through the phases of wound healing (inflammation - proliferation - remodeling) by: Decrease Necrotic Tissue to: 60 Decrease Necrotic Tissue - Progress: Progressing toward goal Increase Granulation Tissue to: 40 Increase Granulation Tissue - Progress: Progressing toward goal Goals/treatment plan/discharge plan were made with and agreed upon by patient/family: No, Patient unable to participate in goals/treatment/discharge plan and family unavailable Time For Goal Achievement: 7 days Wound Therapy - Potential for Goals: Poor  Goals will be updated until maximal potential achieved or discharge criteria met.  Discharge criteria: when goals achieved, discharge from hospital, MD decision/surgical intervention, no progress towards goals,  refusal/missing three consecutive treatments without notification or medical reason.  GP      Wyona Almas, PT, DPT Acute Rehabilitation Services Pager [phone] Office [phone]  Deno Etienne 10/05/2019, 1:13 PM

## 2019-10-05 NOTE — Progress Notes (Signed)
49 Days Post-Op Procedure(s) (LRB): REMOVAL OF IMPELLA LEFT VENTRICULAR ASSIST DEVICE (N/A) TRANSESOPHAGEAL ECHOCARDIOGRAM (TEE) (N/A) Subjective: Denies pain currently, but did indicate pain to RN earlier  Objective: Vital signs in last 24 hours: Temp:  [97.9 F (36.6 C)-98.6 F (37 C)] 98.2 F (36.8 C) (08/27 0300) Pulse Rate:  [62-87] 74 (08/27 0750) Cardiac Rhythm: Normal sinus rhythm (08/26 2000) Resp:  [12-28] 20 (08/27 0750) BP: (98-147)/(44-89) 119/64 (08/27 0600) SpO2:  [90 %-100 %] 97 % (08/27 0750) FiO2 (%):  [40 %] 40 % (08/27 0750) Weight:  [75.4 kg] 75.4 kg (08/27 0300)  Hemodynamic parameters for last 24 hours:    Intake/Output from previous day: 08/26 0701 - 08/27 0700 In: 1246 [Blood:756; NG/GT:490] Out: 2435 [Urine:2110; Stool:325] Intake/Output this shift: No intake/output data recorded.  General appearance: alert, cooperative and no distress Neurologic: intact Heart: regular rate and rhythm Lungs: less coarse than yesterday Abdomen: tubes in place, mild discomfort to palpation  Lab Results: Recent Labs    10/04/19 0434 10/04/19 0434 10/04/19 2023 10/05/19 0510  WBC 11.5*  --   --  15.1*  HGB 6.8*   < > 8.4* 8.5*  HCT 22.6*   < > 27.5* 27.3*  PLT 169  --   --  184   < > = values in this interval not displayed.   BMET:  Recent Labs    10/04/19 0434 10/05/19 0510  NA 143 149*  K 4.3 4.0  CL 99 105  CO2 36* 37*  GLUCOSE 370* 122*  BUN 148* 141*  CREATININE 1.25* 1.17  CALCIUM 8.0* 8.2*    PT/INR: No results for input(s): LABPROT, INR in the last 72 hours. ABG    Component Value Date/Time   PHART 7.366 09/22/2019 2147   HCO3 36.7 (H) 10/02/2019 0408   TCO2 41 (H) 09/22/2019 2147   ACIDBASEDEF 1.0 08/08/2019 1115   O2SAT 76.7 10/04/2019 0434   CBG (last 3)  Recent Labs    10/05/19 0025 10/05/19 0409 10/05/19 0724  GLUCAP 121* 109* 112*    Assessment/Plan: S/P Procedure(s) (LRB): REMOVAL OF IMPELLA LEFT VENTRICULAR  ASSIST DEVICE (N/A) TRANSESOPHAGEAL ECHOCARDIOGRAM (TEE) (N/A) -CV- stable  BNP still elevated but down with increased Lasix RESP- VDRF, unable to make much progress with weaning   CXr may be slightly improved with diuresis RENAL- creatinine stable, Sodium up with diuresis, monitor ENDO- CBG better controlled ID- afebrile off antibiotics GI- TF held overnight. Will try to resume this AM  T bili up slightly with perc drain clamped but still about where it has been  Will leave drain clamped again today and recheck in AM Deconditioning- severe  LOS: 71 days    [name] 10/05/2019

## 2019-10-05 NOTE — Progress Notes (Signed)
Physical Therapy Treatment Patient Details Name: Todd [name] MRN: [id number] DOB: 08-31-1955 Today's Date: 10/05/2019    History of Present Illness Todd [name] is a 64 y.o. male presenting with shortness of breath; noted AKI, CHF, recent PNA, elevated troponin, pulm edema/effusion, and cardiomegaly.Marland Kitchen CABG 6/24 and intubated (extubated 6/25)--went into cardiac arrest on same date with re-opening of chest for heart massage and direct epicardial paddles and place on ECMO. 08/03/19 washout out for tamponade. 7/2 sternal closure and wound vac. 7/9 Impella removed.08/20/19 Tracheostomy 08/24/19 cholecystostomy tube. 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 PMHx: HFrEF, COPD, HTN, T2DM, HLD, MDD, chronic back pain, vitamin deficiency, BPH, tobacco use disorder, marijuana use disorder    PT Comments    Pt awake, alert and partially verbal during today's session. Pt continues to have increased flexion in bil UE and resistance to passive stretch but improved from last session. Pt with improved standing tolerance in the bed this session with tilt to 45degrees for 15 min. Discussed tilt schedule and need for increased mobility with RN with tilt log provided.   PRVC FiO2 40%, peep 5 97% SpO2 HR 76  BP supine 124/57 (76) Initial tilt 118/52 (71) 10 min tilt 122/61 (77)     Follow Up Recommendations  LTACH;SNF     Equipment Recommendations  Other (comment) (defer to next venue)    Recommendations for Other Services       Precautions / Restrictions Precautions Precautions: Fall;Sternal Precaution Comments: trach, chole drain, sacral wound, PEG    Mobility  Bed Mobility Overal bed mobility: Needs Assistance Bed Mobility: Rolling Rolling: Mod assist         General bed mobility comments: total +2 to slide toward HOB, Mod assist to roll with cues not to pull for sternal precautions. Supine to stand and back via tilt. pt positioned in semi left roll with  partial chair end of session  Transfers                    Ambulation/Gait                 Stairs             Wheelchair Mobility    Modified Rankin (Stroke Patients Only)       Balance                                            Cognition Arousal/Alertness: Awake/alert Behavior During Therapy: Flat affect Overall Cognitive Status: Impaired/Different from baseline Area of Impairment: Orientation;Memory;Following commands                   Current Attention Level: Sustained Memory: Decreased short-term memory Following Commands: Follows one step commands with increased time Safety/Judgement: Decreased awareness of safety;Decreased awareness of deficits   Problem Solving: Slow processing;Decreased initiation;Difficulty sequencing;Requires verbal cues;Requires tactile cues General Comments: intermittently following commands this session given increased time and multimodal cues. pt with some verbalizations stating back pain      Exercises General Exercises - Upper Extremity Shoulder Flexion: AAROM;Both;10 reps Elbow Flexion: AAROM;Left;10 reps;Standing Elbow Extension: AAROM;Both;Standing;10 reps General Exercises - Lower Extremity Quad Sets: AROM;Both;10 reps;Standing Short Arc Quad: AAROM;Both;5 reps;Seated    General Comments        Pertinent Vitals/Pain Pain Score: 5  Pain Location: back Pain Descriptors / Indicators: Aching;Discomfort;Grimacing Pain  Intervention(s): Limited activity within patient's tolerance;Monitored during session;Repositioned    Home Living                      Prior Function            PT Goals (current goals can now be found in the care plan section) Acute Rehab PT Goals Time For Goal Achievement: 10/19/19 Potential to Achieve Goals: Fair Progress towards PT goals: Progressing toward goals (goals remain appropriate given medical complexity)    Frequency    Min  2X/week      PT Plan Current plan remains appropriate    Co-evaluation              AM-PAC PT "6 Clicks" Mobility   Outcome Measure  Help needed turning from your back to your side while in a flat bed without using bedrails?: Total Help needed moving from lying on your back to sitting on the side of a flat bed without using bedrails?: Total Help needed moving to and from a bed to a chair (including a wheelchair)?: Total Help needed standing up from a chair using your arms (e.g., wheelchair or bedside chair)?: Total Help needed to walk in hospital room?: Total Help needed climbing 3-5 steps with a railing? : Total 6 Click Score: 6    End of Session   Activity Tolerance: Patient tolerated treatment well Patient left: in bed;with call bell/phone within reach;with nursing/sitter in room Nurse Communication: Mobility status;Need for lift equipment PT Visit Diagnosis: Unsteadiness on feet (R26.81);Muscle weakness (generalized) (M62.81);Difficulty in walking, not elsewhere classified (R26.2)     Time: 6063-0160 PT Time Calculation (min) (ACUTE ONLY): 29 min  Charges:  $Therapeutic Exercise: 8-22 mins $Therapeutic Activity: 8-22 mins                     Louanne Calvillo P, PT Acute Rehabilitation Services Pager: [phone] Office: [phone]    Amerah Puleo B Lenord Fralix 10/05/2019, 1:45 PM

## 2019-10-06 LAB — CBC
HCT: 26.9 % — ABNORMAL LOW (ref 39.0–52.0)
Hemoglobin: 8.2 g/dL — ABNORMAL LOW (ref 13.0–17.0)
MCH: 28.7 pg (ref 26.0–34.0)
MCHC: 30.5 g/dL (ref 30.0–36.0)
MCV: 94.1 fL (ref 80.0–100.0)
Platelets: 236 10*3/uL (ref 150–400)
RBC: 2.86 MIL/uL — ABNORMAL LOW (ref 4.22–5.81)
RDW: 21 % — ABNORMAL HIGH (ref 11.5–15.5)
WBC: 14.2 10*3/uL — ABNORMAL HIGH (ref 4.0–10.5)
nRBC: 0 % (ref 0.0–0.2)

## 2019-10-06 LAB — COMPREHENSIVE METABOLIC PANEL
ALT: 77 U/L — ABNORMAL HIGH (ref 0–44)
AST: 109 U/L — ABNORMAL HIGH (ref 15–41)
Albumin: 1.6 g/dL — ABNORMAL LOW (ref 3.5–5.0)
Alkaline Phosphatase: 901 U/L — ABNORMAL HIGH (ref 38–126)
Anion gap: 9 (ref 5–15)
BUN: 137 mg/dL — ABNORMAL HIGH (ref 8–23)
CO2: 39 mmol/L — ABNORMAL HIGH (ref 22–32)
Calcium: 8.3 mg/dL — ABNORMAL LOW (ref 8.9–10.3)
Chloride: 105 mmol/L (ref 98–111)
Creatinine, Ser: 1.29 mg/dL — ABNORMAL HIGH (ref 0.61–1.24)
GFR calc Af Amer: >60 mL/min (ref >60)
GFR calc non Af Amer: 58 mL/min — ABNORMAL LOW (ref >60)
Glucose, Bld: 224 mg/dL — ABNORMAL HIGH (ref 70–99)
Potassium: 3.5 mmol/L (ref 3.5–5.1)
Sodium: 153 mmol/L — ABNORMAL HIGH (ref 135–145)
Total Bilirubin: 5.4 mg/dL — ABNORMAL HIGH (ref 0.3–1.2)
Total Protein: 6.1 g/dL — ABNORMAL LOW (ref 6.5–8.1)

## 2019-10-06 LAB — COOXEMETRY PANEL
Carboxyhemoglobin: 2.2 % — ABNORMAL HIGH (ref 0.5–1.5)
Methemoglobin: 1.3 % (ref 0.0–1.5)
O2 Saturation: 74 %
Total hemoglobin: 8.6 g/dL — ABNORMAL LOW (ref 12.0–16.0)

## 2019-10-06 LAB — GLUCOSE, CAPILLARY
Glucose-Capillary: 215 mg/dL — ABNORMAL HIGH (ref 70–99)
Glucose-Capillary: 214 mg/dL — ABNORMAL HIGH (ref 70–99)
Glucose-Capillary: 214 mg/dL — ABNORMAL HIGH (ref 70–99)
Glucose-Capillary: 211 mg/dL — ABNORMAL HIGH (ref 70–99)
Glucose-Capillary: 189 mg/dL — ABNORMAL HIGH (ref 70–99)
Glucose-Capillary: 306 mg/dL — ABNORMAL HIGH (ref 70–99)
Glucose-Capillary: 322 mg/dL — ABNORMAL HIGH (ref 70–99)

## 2019-10-06 MED ORDER — POTASSIUM CHLORIDE 20 MEQ/15ML (10%) PO SOLN
20.0000 meq | ORAL | Status: AC
  Administered 2019-10-06 (×3): 20 meq
  Filled 2019-10-06 (×3): qty 15

## 2019-10-06 MED ORDER — FREE WATER
200.0000 mL | Status: DC
  Administered 2019-10-06 – 2019-10-07 (×6): 200 mL

## 2019-10-06 NOTE — Progress Notes (Addendum)
Patient ID: Todd [name], male   DOB: May 23, 1955, 64 y.o.   MRN: [id number] TCTS DAILY ICU PROGRESS NOTE                   [address]            [place], [address]          [phone]   50 Days Post-Op Procedure(s) (LRB): REMOVAL OF IMPELLA LEFT VENTRICULAR ASSIST DEVICE (N/A) TRANSESOPHAGEAL ECHOCARDIOGRAM (TEE) (N/A)  65 days postop  DATE OF PROCEDURE: 08/02/2019 PREOPERATIVE DIAGNOSIS: Severe 3-vessel coronary disease with ischemic cardiomyopathy. POSTOPERATIVE DIAGNOSIS: Severe 3-vessel coronary disease with ischemic cardiomyopathy. PROCEDURE:  Median sternotomy, extracorporeal circulation, Coronary artery bypass grafting x 4 Left internal mammary artery to left anterior descending, Saphenous vein graft to 1st diagonal, Saphenous vein graft to posterior descending, Left radial to obtuse marginal 1 Endoscopic vein harvestright thigh   Total Length of Stay:  LOS: 72 days   Subjective: Patient remains on ventilator, is responsive  Objective: Vital signs in last 24 hours: Temp:  [98.1 F (36.7 C)-98.9 F (37.2 C)] 98.9 F (37.2 C) (08/27 2025) Pulse Rate:  [69-83] 69 (08/28 0100) Cardiac Rhythm: Normal sinus rhythm (08/27 2000) Resp:  [14-25] 19 (08/28 0100) BP: (99-138)/(46-77) 104/48 (08/28 0100) SpO2:  [89 %-100 %] 98 % (08/28 0100) FiO2 (%):  [40 %] 40 % (08/28 0144) Weight:  [76.3 kg] 76.3 kg (08/28 0500)  Filed Weights   10/04/19 0500 10/05/19 0300 10/06/19 0500  Weight: 73.4 kg 75.4 kg 76.3 kg    Weight change: 0.9 kg   Hemodynamic parameters for last 24 hours:    Intake/Output from previous day: 08/27 0701 - 08/28 0700 In: 942.9 [NG/GT:793; IV Piggyback:149.9] Out: 3250 [Urine:3250]  Intake/Output this shift: No intake/output data recorded.  Current Meds: Scheduled Meds: . sodium chloride   Intravenous Once  . sodium chloride   Intravenous Once  . sodium chloride   Intravenous Once  . amiodarone   200 mg Per Tube Daily  . aspirin  81 mg Per Tube Daily  . chlorhexidine gluconate (MEDLINE KIT)  15 mL Mouth Rinse BID  . Chlorhexidine Gluconate Cloth  6 each Topical Daily  . clonazePAM  0.5 mg Per Tube BID  . collagenase   Topical BID  . fentaNYL  1 patch Transdermal Q72H  . FLUoxetine  20 mg Per Tube Daily  . furosemide  40 mg Intravenous BID  . insulin aspart  0-9 Units Subcutaneous Q4H  . insulin detemir  10 Units Subcutaneous BID  . ipratropium-albuterol  3 mL Nebulization TID  . lidocaine  1 patch Transdermal Q24H  . mouth rinse  15 mL Mouth Rinse 10 times per day  . midodrine  5 mg Per Tube TID WC  . nutrition supplement (JUVEN)  1 packet Per Tube BID BM  . pantoprazole (PROTONIX) IV  40 mg Intravenous Q12H  . potassium chloride  20 mEq Per Tube Q4H  . QUEtiapine  50 mg Oral QHS  . sodium chloride flush  10-40 mL Intracatheter Q12H  . sodium chloride flush  3 mL Intravenous Q12H  . sodium chloride flush  5 mL Intracatheter Q8H  . sodium chloride HYPERTONIC  4 mL Nebulization BID  . testosterone  5 g Transdermal Daily  . thiamine  100 mg Per Tube Daily   Continuous Infusions: . feeding supplement (PIVOT 1.5 CAL) 1,000 mL (10/05/19 1840)  . levofloxacin (LEVAQUIN) IV Stopped (10/05/19 1911)   PRN Meds:.acetaminophen (  TYLENOL) oral liquid 160 mg/5 mL, dextrose, fentaNYL (SUBLIMAZE) injection, Jerzie Bieri's butt cream, lip balm, metoprolol tartrate, ondansetron (ZOFRAN) IV, oxyCODONE, silver nitrate applicators, sodium chloride flush, traZODone  General appearance: alert, cooperative and no distress Neurologic: intact Heart: regular rate and rhythm, S1, S2 normal, no murmur, click, rub or gallop Lungs: diminished breath sounds bilaterally Abdomen: soft, non-tender; bowel sounds normal; no masses,  no organomegaly and Gallbladder drain in place, currently clamped Extremities: Patient currently in [place] boots to prevent contractures Wound: Sternal incision is stable, continuing  with wet-to-dry dressings of large decubitus ulcer  Lab Results: CBC: Recent Labs    10/05/19 0510 10/06/19 0509  WBC 15.1* 14.2*  HGB 8.5* 8.2*  HCT 27.3* 26.9*  PLT 184 236   BMET:  Recent Labs    10/05/19 0510 10/06/19 0509  NA 149* 153*  K 4.0 3.5  CL 105 105  CO2 37* 39*  GLUCOSE 122* 224*  BUN 141* 137*  CREATININE 1.17 1.29*  CALCIUM 8.2* 8.3*    CMET: Lab Results  Component Value Date   WBC 14.2 (H) 10/06/2019   HGB 8.2 (L) 10/06/2019   HCT 26.9 (L) 10/06/2019   PLT 236 10/06/2019   GLUCOSE 224 (H) 10/06/2019   CHOL 203 (H) 07/27/2019   TRIG 213 (H) 08/27/2019   HDL 34 (L) 07/27/2019   LDLCALC 148 (H) 07/27/2019   ALT 77 (H) 10/06/2019   AST 109 (H) 10/06/2019   NA 153 (H) 10/06/2019   K 3.5 10/06/2019   CL 105 10/06/2019   CREATININE 1.29 (H) 10/06/2019   BUN 137 (H) 10/06/2019   CO2 39 (H) 10/06/2019   TSH 2.243 07/31/2019   INR 1.3 (H) 09/27/2019   HGBA1C 5.3 09/25/2019      PT/INR: No results for input(s): LABPROT, INR in the last 72 hours. Radiology: US Abdomen Limited RUQ  Result Date: 10/05/2019 CLINICAL DATA:  Elevated bilirubin. EXAM: ULTRASOUND ABDOMEN LIMITED RIGHT UPPER QUADRANT COMPARISON:  CT abdomen and pelvis dated August 28, 2019. FINDINGS: Gallbladder: Percutaneous cholecystostomy tube in place. Small stones and sludge. No wall thickening visualized. No sonographic Murphy sign noted by sonographer. Common bile duct: Diameter: 6 mm, normal. Liver: No focal lesion identified. Nodular contour. Within normal limits in parenchymal echogenicity. Portal vein is patent on color Doppler imaging with normal direction of blood flow towards the liver. Other: Small perihepatic ascites.  Right pleural effusion. IMPRESSION: 1. No acute findings. 2. Percutaneous cholecystostomy tube in place. Cholelithiasis and sludge. 3. Cirrhosis with small perihepatic ascites. 4. Right pleural effusion. Electronically Signed   By: Titus Dubin M.D.   On:  10/05/2019 18:11     Assessment/Plan: S/P Procedure(s) (LRB): REMOVAL OF IMPELLA LEFT VENTRICULAR ASSIST DEVICE (N/A) TRANSESOPHAGEAL ECHOCARDIOGRAM (TEE) (N/A) Contraction alkalosis CO2 39 BUN 137 Anemia with probable GI blood loss-likely contributing to elevated bilirubin hemoglobin stable since yesterday Total bilirubin 5.4-results of abdominal ultrasound noted Back on Levaquin last night per pulmonary  Diffuse bilateral pulmonary infiltrates persist noted on chest x-ray Monitor for increasing bilirubin with gallbladder drain clamped  Grace Isaac 10/06/2019 7:26 AM

## 2019-10-06 NOTE — Progress Notes (Signed)
Patient ID: Todd [name], male   DOB: May 27, 1955, 64 y.o.   MRN: [id number] EVENING ROUNDS NOTE :     [address]       [place] [address]             [phone]                 50 Days Post-Op Procedure(s) (LRB): REMOVAL OF IMPELLA LEFT VENTRICULAR ASSIST DEVICE (N/A) TRANSESOPHAGEAL ECHOCARDIOGRAM (TEE) (N/A)  Total Length of Stay:  LOS: 72 days  BP (!) 121/91   Pulse 86   Temp 98.3 F (36.8 C) (Oral)   Resp (!) 24   Ht 5\' 9"  (1.753 m)   Wt 76.3 kg   SpO2 93%   BMI 24.84 kg/m   .Intake/Output      08/28 0701 - 08/29 0700   NG/GT 350   IV Piggyback    Total Intake(mL/kg) 350 (4.6)   Urine (mL/kg/hr) 1390 (1.4)   Total Output 1390   Net -1040         . feeding supplement (PIVOT 1.5 CAL) 1,000 mL (10/05/19 1840)  . levofloxacin (LEVAQUIN) IV 750 mg (10/06/19 1816)     Lab Results  Component Value Date   WBC 14.2 (H) 10/06/2019   HGB 8.2 (L) 10/06/2019   HCT 26.9 (L) 10/06/2019   PLT 236 10/06/2019   GLUCOSE 224 (H) 10/06/2019   CHOL 203 (H) 07/27/2019   TRIG 213 (H) 08/27/2019   HDL 34 (L) 07/27/2019   LDLCALC 148 (H) 07/27/2019   ALT 77 (H) 10/06/2019   AST 109 (H) 10/06/2019   NA 153 (H) 10/06/2019   K 3.5 10/06/2019   CL 105 10/06/2019   CREATININE 1.29 (H) 10/06/2019   BUN 137 (H) 10/06/2019   CO2 39 (H) 10/06/2019   TSH 2.243 07/31/2019   INR 1.3 (H) 09/27/2019   HGBA1C 5.3 09/25/2019   cpap for 12 hours today   09/27/2019 MD  Beeper [phone] Office [phone] 10/06/2019 7:45 PM

## 2019-10-06 NOTE — Progress Notes (Signed)
NAME:  Todd [name], MRN:  [id number], DOB:  April 18, 1955, LOS: 66 ADMISSION DATE:  07/26/2019, CONSULTATION DATE:  08/03/2019 REFERRING MD:  Roxan Hockey, CHIEF COMPLAINT:  ECMO    Brief History   64 year old male with history of ischemic cardiomyopathy admitted with CHF exacerbation, found to have multi-vessel disease requiring CABG 6/24, post op VT arrest 6/25 and cannulated for VA ECMO. S/p decannulation and impella. Hospital course has been complicated by cholecystitis, candidemia, and chronic hypoxic respiratory failure requiring tracheostomy.   Past Medical History   Past Medical History:  Diagnosis Date  . Arthritis   . CHF (congestive heart failure) ([place])   . Depression   . Diabetes mellitus without complication ([place])   . Hard of hearing   . Hyperlipidemia   . Hypertension    Significant Hospital Events   6/24 CABG 6/25 VA cannulation 6/28 decannulated and impella placed 6/29 bedside re-exploration; s/p decannulation. Placement of impella device originally at p8 but decreased to p4 2/2 suction events overnight up to p6. Echo completed and repositioned device. Remained on considerable support with 8 epi and 46 norepi vaso 0.05. continued chest tube output with large clots noted. Heparin thru device. Replacement products ongoing. 60% 8 peep 6/30 bedside mediastinal re-exploration and clean out of hematoma with tamponade and worsening hemodynamics. Pt had large volume transfusion. rebolused with amio 2/2 nsvt episode.  Weight up 48 pounds.  Started diuresis, Lasix drip started. 7/01 iatrogenic respiratory alkalosis, vent rate decreased. 7/02 No significant issues overnight remains on pressors and Impella not tolerating tube feeds with high gastric output awaiting core track placement 7/03 started trickle TF  7/05 Swab removed, CVL and PICC placed. 7/13 Trach and BAL 7/16 Awake and tracking. No follow commands. Still having fevers. Successful ultrasound and CT guided placement of a  10.2 French cholecystostomy tube. A small amount of aspirated bile was capped and sent to the laboratory for analysis 7/17 Off sedation, PSV.  Beginning to follow some commands occasionally intermittently.  Very jaundiced.  Growing Candida in the blood and BAL > diflucan.   7/21 Ongoing fevers, WBC increased 7/22 Persistent fevers, pan cultured with CT Chest/Head. Suspected source tracheobronchitis.  7/28 Transitioned to eliquis, jaundiced but labs improving  7/29 Agitation/restless.  Trazodone added QHS. Weaning midodrine, attempting diuresis  7/30 Increased Na, weaning on PSV.  8/01 On 40% ATC, significantly improved mental status 8/1>> ongoing attempts at trach weaning complicated by weakness, secretion management, and anxiety 8/8 started back on meropenem and milrinone 8/12 patient making slow improvements, he has been able to tolerate short course ATC 8/13 Weaned off milrinone 8/14 Code blue called for hypoxemia leading to PEA arrest. ROSC achieved after 6 min. 8/15: In  morning patient is unfazed from his recent events and continues asking for water to drink. Awake, alert and follows commands. 8/16 seen by surg per wound care recs-->"Do not recommend any acute surgical intervention. Will ultimately defer wound care recommendations to the wound care team, but I think it is reasonable to decrease hydrotherapy to 3x per week. I would increase dressing changes to BID and continue santyl for enzymatic debridement". 8/17: Using albumin and Lasix to try to mobilize anasarca 8/18: Tracheostomy downsized to 7.5.  Attempting short trials of aerosol trach collar 8/19-8/20 Now in a negative fluid balance.  Sodium continuing to rise.  Holding diuretics continuing to work on physical therapy and reconditioning, got a unit of blood in am for hgb 7.3. F/U hgb was 6.8, had scant amt  of bloody tracheal out-put but not enough to account for hgb loss. No frank blood in stools. Got 2 more units of PRBCs. Hgb now  9.7. w/out obvious source. Complaining of more pain than last few days. More restless. Stopped arixtra again. Added fent patch for pain needs.   Consults:  Advanced Heart Failure PCCM TCTS  Procedures:  LUE PICC 7/5 >>7/21 R Brachial ALine 7/9 >>7/27 Chest Tubes >> out L IJ CVC 7/21 >>7/27 Peg tube 7/27 >> Tracheostomy downsized to 7.5 on 8/18  Significant Diagnostic Tests:  6/22 spirometry with restrictive physiology, preserved FEV1/FVC 6/18 echo: LVEF 34-74%, grade I diastolic dysfunction 2/59 US Abdomen cholelithiasis without cholecystitis. Mild wall thinking in setting of liver disease and ascites.  7/10 LE [place] negative for DVT  7/20 CT ABD w contrast >> stable position of the percutaneous cholecystostomy tube with complete decompression of the gallbladder, trace residual free fluid within the RUQ, decreased since prior, trace bilateral pleural effusions L>R, scattered areas of ground glass airspace disease within the RML, RLL 7/22 CT Chest w/o Contrast >> Retrosternal fluid collection, non specific.  Bilateral pleural effusions, portions loculated.   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 Stable bilateral mastoid and middle ear effusions. Right upper quadrant ultrasound 8/27 >> per cholecystostomy tube in place with some cholelithiasis and sludge, no dilation or wall thickening, cirrhosis  Micro Data:  7/3 RQ> Few candida tropicalis, likely contaminant 7/12 BAL Cx> Candida tropicalis and albicans, likely contaminant 7/14 BCx >> candida parapsilosis 7/17 BCx >> neg resp 8/7 >> stenotrophomonas resp 8/26> stenotrophomonas  Antimicrobials:  Meropenem 8/8 >>8/10 Diflucan  7/16 >> 8/9 vanc 7/22 >>8/10 levaquin 8/10 >> 8/23 levaquin 8/27 >>   Interim history/subjective:   Levaquin restarted on 8/27 for stenotrophomonas sputum Currently on PS 10  Objective   Blood pressure 127/73, pulse 79,  temperature 98.1 F (36.7 C), resp. rate 20, height _0  (1.753 m), weight 76.3 kg, SpO2 99 %.    Vent Mode: PRVC FiO2 (%):  [40 %] 40 % Set Rate:  [20 bmp] 20 bmp Vt Set:  [420 mL] 420 mL PEEP:  [5 cmH20] 5 cmH20 Plateau Pressure:  [8 cmH20] 8 cmH20   Intake/Output Summary (Last 24 hours) at 10/06/2019 0757 Last data filed at 10/06/2019 0600 Gross per 24 hour  Intake 942.87 ml  Output 3250 ml  Net -2307.13 ml   Filed Weights   10/04/19 0500 10/05/19 0300 10/06/19 0500  Weight: 73.4 kg 75.4 kg 76.3 kg    Examination: General: Ill-appearing elderly man, ventilated, laying on his right side HENT: Temporal wasting, oropharynx dry Neck: Tracheostomy in good position, clean/dry Lungs: Distant, inspiratory rhonchi bilaterally, no wheezing Cardiovascular: Regular, distant Abdomen: Nondistended, mild tenderness right upper quadrant, drain in place, clamped Extremities: No pretibial edema Neuro: Nods to questions, weak.  Moves his upper extremities  Resolved Hospital Problem list   Suspected HCAP Acute kidney injury Hypernatremia Acute Metabolic Encephalopathy  -CTH without acute intracranial pathology. EEG negative 7/23.  Shock Heparin-induced thrombocytopenia >> resolved, required bivalirudin 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 -Opthalmology evaluated , diflucan x 3 weeks PEA arrest 8/14 preceded by hypoxemia Assessment & Plan:  Acute hypoxic respiratory failure requiring mechanical ventilation with severe weakness/deconditioning Tracheostomy dependence; sedation minimized over the last 24 hours -PSV as he can tolerate.  Not doing any trach collar currently -Trach care order set as per protocol -VAP prevention orders -Tracheal aspirate with stenotrophomonas, question  colonized.  Levofloxacin restarted on 8/27 -Chest PT  Hypervolemia with pulmonary congestion Improving with diuresis; tolerating some PSV -Diuresing, Lasix 40 mg twice daily.  Note rising  sodium, serum creatinine.  May need to back off on diuresis in the next 24 hours -Follow I's/O, currently -5.4 L total -Follow BMP, urine output  Cardiogenic/hemorrhagic shock s/p CABG complicated by VT arrest. Shock resolved. S/p VA ECMO (decannulated 6/28), s/p impella placement (6/28>7/9) Atrial flutter, reverted to NSR -Enteral amiodarone  Hyperbilirubinemia 2/2 acalculous cholecystitis vs drug induced cholestasis S/p percutaneous GB drain by IR on 7/17 -Following LFT -Gallbladder drain clamped and ultrasound 8/27 reassuring.  Follow for any evidence of evolving cholestasis.  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 minimal output in biliary drain - would not account for degree of Hb drop. Suspect possible GI bleed given worsening azotemia.  -Continues to have evidence for slow slow blood loss, no clear GI source.  No endoscopy or colonoscopy recommended given his overall instability -Continue to follow CBC, hemoglobin goal 7.0 -Arixtra is on hold  Azotemia, contribution presumed GI blood loss Mild acute renal insufficiency, suspect due to aggressive diuresis Hypernatremia -Follow BMP, urine output -Add low-dose free water 8/28 and continue current diuretic regimen.  Adjust depending on evolving renal function   Best practice:  Diet: tube feeds Pain/Anxiety/Delirium protocol (if indicated): per protocol VAP protocol (if indicated): per protocol DVT prophylaxis: SCDs GI prophylaxis: PPI BID Glucose control: basal bolus insulin Mobility: PT/OT as able  Code Status: FULL Family Communication: per primary Disposition: ICU for vent management and weaning - awaiting LTACH placement   This patient is critically ill with multiple organ system failure which requires frequent  high complexity decision making, assessment, support, evaluation, and titration of therapies. This was completed through the application of advanced monitoring technologies and extensive interpretation of multiple databases. During this encounter critical care time was devoted to patient care services described in this note for 32 minutes.   Baltazar Apo, MD, PhD 10/06/2019, 8:10 AM [facility] [phone] or if no answer [phone]

## 2019-10-07 ENCOUNTER — Inpatient Hospital Stay (HOSPITAL_COMMUNITY): Payer: Medicare Other

## 2019-10-07 LAB — BASIC METABOLIC PANEL
Anion gap: 5 (ref 5–15)
Anion gap: 8 (ref 5–15)
BUN: 132 mg/dL — ABNORMAL HIGH (ref 8–23)
BUN: 123 mg/dL — ABNORMAL HIGH (ref 8–23)
CO2: 42 mmol/L — ABNORMAL HIGH (ref 22–32)
CO2: 41 mmol/L — ABNORMAL HIGH (ref 22–32)
Calcium: 8.4 mg/dL — ABNORMAL LOW (ref 8.9–10.3)
Calcium: 8.2 mg/dL — ABNORMAL LOW (ref 8.9–10.3)
Chloride: 108 mmol/L (ref 98–111)
Chloride: 108 mmol/L (ref 98–111)
Creatinine, Ser: 1.17 mg/dL (ref 0.61–1.24)
Creatinine, Ser: 1 mg/dL (ref 0.61–1.24)
GFR calc Af Amer: >60 mL/min (ref >60)
GFR calc Af Amer: >60 mL/min (ref >60)
GFR calc non Af Amer: >60 mL/min (ref >60)
GFR calc non Af Amer: >60 mL/min (ref >60)
Glucose, Bld: 264 mg/dL — ABNORMAL HIGH (ref 70–99)
Glucose, Bld: 303 mg/dL — ABNORMAL HIGH (ref 70–99)
Potassium: 3.8 mmol/L (ref 3.5–5.1)
Potassium: 3.9 mmol/L (ref 3.5–5.1)
Sodium: 155 mmol/L — ABNORMAL HIGH (ref 135–145)
Sodium: 157 mmol/L — ABNORMAL HIGH (ref 135–145)

## 2019-10-07 LAB — MAGNESIUM: Magnesium: 2.5 mg/dL — ABNORMAL HIGH (ref 1.7–2.4)

## 2019-10-07 LAB — CULTURE, RESPIRATORY W GRAM STAIN: Culture: NORMAL

## 2019-10-07 LAB — CBC
HCT: 28 % — ABNORMAL LOW (ref 39.0–52.0)
HCT: 26.5 % — ABNORMAL LOW (ref 39.0–52.0)
Hemoglobin: 8.2 g/dL — ABNORMAL LOW (ref 13.0–17.0)
Hemoglobin: 7.6 g/dL — ABNORMAL LOW (ref 13.0–17.0)
MCH: 28.3 pg (ref 26.0–34.0)
MCH: 27.9 pg (ref 26.0–34.0)
MCHC: 29.3 g/dL — ABNORMAL LOW (ref 30.0–36.0)
MCHC: 28.7 g/dL — ABNORMAL LOW (ref 30.0–36.0)
MCV: 96.6 fL (ref 80.0–100.0)
MCV: 97.4 fL (ref 80.0–100.0)
Platelets: 221 10*3/uL (ref 150–400)
Platelets: 228 10*3/uL (ref 150–400)
RBC: 2.9 MIL/uL — ABNORMAL LOW (ref 4.22–5.81)
RBC: 2.72 MIL/uL — ABNORMAL LOW (ref 4.22–5.81)
RDW: 20.9 % — ABNORMAL HIGH (ref 11.5–15.5)
RDW: 21.3 % — ABNORMAL HIGH (ref 11.5–15.5)
WBC: 13.4 10*3/uL — ABNORMAL HIGH (ref 4.0–10.5)
WBC: 13.5 10*3/uL — ABNORMAL HIGH (ref 4.0–10.5)
nRBC: 0 % (ref 0.0–0.2)
nRBC: 0 % (ref 0.0–0.2)

## 2019-10-07 LAB — GLUCOSE, CAPILLARY
Glucose-Capillary: 245 mg/dL — ABNORMAL HIGH (ref 70–99)
Glucose-Capillary: 162 mg/dL — ABNORMAL HIGH (ref 70–99)
Glucose-Capillary: 216 mg/dL — ABNORMAL HIGH (ref 70–99)
Glucose-Capillary: 257 mg/dL — ABNORMAL HIGH (ref 70–99)
Glucose-Capillary: 262 mg/dL — ABNORMAL HIGH (ref 70–99)

## 2019-10-07 LAB — BRAIN NATRIURETIC PEPTIDE: B Natriuretic Peptide: 3522 pg/mL — ABNORMAL HIGH (ref 0.0–100.0)

## 2019-10-07 MED ORDER — FREE WATER
400.0000 mL | Status: DC
  Administered 2019-10-07 – 2019-10-09 (×12): 400 mL

## 2019-10-07 NOTE — Progress Notes (Signed)
Patient ID: Todd [name], male   DOB: 12-13-1955, 64 y.o.   MRN: [id number] TCTS DAILY ICU PROGRESS NOTE                   [address]            [place] [address]          [phone]   51 Days Post-Op Procedure(s) (LRB): REMOVAL OF IMPELLA LEFT VENTRICULAR ASSIST DEVICE (N/A) TRANSESOPHAGEAL ECHOCARDIOGRAM (TEE) (N/A)  65 days postop  DATE OF PROCEDURE: 08/02/2019 PREOPERATIVE DIAGNOSIS: Severe 3-vessel coronary disease with ischemic cardiomyopathy. POSTOPERATIVE DIAGNOSIS: Severe 3-vessel coronary disease with ischemic cardiomyopathy. PROCEDURE:  Median sternotomy, extracorporeal circulation, Coronary artery bypass grafting x 4 Left internal mammary artery to left anterior descending, Saphenous vein graft to 1st diagonal, Saphenous vein graft to posterior descending, Left radial to obtuse marginal 1 Endoscopic vein harvestright thigh   Total Length of Stay:  LOS: 73 days   Subjective: Some increased secretions this a.m.,   Objective: Vital signs in last 24 hours: Temp:  [98.2 F (36.8 C)-98.7 F (37.1 C)] 98.2 F (36.8 C) (08/29 0723) Pulse Rate:  [51-88] 82 (08/29 0400) Cardiac Rhythm: Normal sinus rhythm (08/29 0004) Resp:  [14-28] 20 (08/29 0400) BP: (94-131)/(46-91) 125/59 (08/29 0400) SpO2:  [92 %-100 %] 95 % (08/29 0400) FiO2 (%):  [40 %] 40 % (08/29 0449) Weight:  [71.6 kg] 71.6 kg (08/29 0500)  Filed Weights   10/05/19 0300 10/06/19 0500 10/07/19 0500  Weight: 75.4 kg 76.3 kg 71.6 kg    Weight change: -4.7 kg   Hemodynamic parameters for last 24 hours:    Intake/Output from previous day: 08/28 0701 - 08/29 0700 In: 1667.8 [NG/GT:1517.8; IV Piggyback:150] Out: 2570 [Urine:2570]  Intake/Output this shift: No intake/output data recorded.  Current Meds: Scheduled Meds: . sodium chloride   Intravenous Once  . sodium chloride   Intravenous Once  . sodium chloride   Intravenous Once  . amiodarone  200  mg Per Tube Daily  . aspirin  81 mg Per Tube Daily  . chlorhexidine gluconate (MEDLINE KIT)  15 mL Mouth Rinse BID  . Chlorhexidine Gluconate Cloth  6 each Topical Daily  . clonazePAM  0.5 mg Per Tube BID  . collagenase   Topical BID  . fentaNYL  1 patch Transdermal Q72H  . FLUoxetine  20 mg Per Tube Daily  . free water  400 mL Per Tube Q4H  . insulin aspart  0-9 Units Subcutaneous Q4H  . insulin detemir  10 Units Subcutaneous BID  . ipratropium-albuterol  3 mL Nebulization TID  . lidocaine  1 patch Transdermal Q24H  . mouth rinse  15 mL Mouth Rinse 10 times per day  . midodrine  5 mg Per Tube TID WC  . nutrition supplement (JUVEN)  1 packet Per Tube BID BM  . pantoprazole (PROTONIX) IV  40 mg Intravenous Q12H  . QUEtiapine  50 mg Oral QHS  . sodium chloride flush  10-40 mL Intracatheter Q12H  . sodium chloride flush  3 mL Intravenous Q12H  . sodium chloride flush  5 mL Intracatheter Q8H  . sodium chloride HYPERTONIC  4 mL Nebulization BID  . testosterone  5 g Transdermal Daily  . thiamine  100 mg Per Tube Daily   Continuous Infusions: . feeding supplement (PIVOT 1.5 CAL) 1,000 mL (10/07/19 0040)  . levofloxacin (LEVAQUIN) IV 750 mg (10/06/19 1816)   PRN Meds:.acetaminophen (TYLENOL) oral liquid 160 mg/5 mL, dextrose,  fentaNYL (SUBLIMAZE) injection, Neila Teem's butt cream, lip balm, metoprolol tartrate, ondansetron (ZOFRAN) IV, oxyCODONE, silver nitrate applicators, sodium chloride flush, traZODone General appearance: cooperative, mild distress and On his side with some increased rhonchi Resp: rhonchi bilaterally Cardio: regular rate and rhythm, S1, S2 normal, no murmur, click, rub or gallop Neurologic: Grossly normal   Lab Results: CBC: Recent Labs    10/06/19 0509 10/07/19 0422  WBC 14.2* 13.4*  HGB 8.2* 8.2*  HCT 26.9* 28.0*  PLT 236 221   BMET:  Recent Labs    10/06/19 0509 10/07/19 0422  NA 153* 155*  K 3.5 3.8  CL 105 108  CO2 39* 42*  GLUCOSE 224* 264*    BUN 137* 132*  CREATININE 1.29* 1.17  CALCIUM 8.3* 8.4*    CMET: Lab Results  Component Value Date   WBC 13.4 (H) 10/07/2019   HGB 8.2 (L) 10/07/2019   HCT 28.0 (L) 10/07/2019   PLT 221 10/07/2019   GLUCOSE 264 (H) 10/07/2019   CHOL 203 (H) 07/27/2019   TRIG 213 (H) 08/27/2019   HDL 34 (L) 07/27/2019   LDLCALC 148 (H) 07/27/2019   ALT 77 (H) 10/06/2019   AST 109 (H) 10/06/2019   NA 155 (H) 10/07/2019   K 3.8 10/07/2019   CL 108 10/07/2019   CREATININE 1.17 10/07/2019   BUN 132 (H) 10/07/2019   CO2 42 (H) 10/07/2019   TSH 2.243 07/31/2019   INR 1.3 (H) 09/27/2019   HGBA1C 5.3 09/25/2019      PT/INR: No results for input(s): LABPROT, INR in the last 72 hours. Radiology: US Abdomen Limited RUQ  Result Date: 10/05/2019 CLINICAL DATA:  Elevated bilirubin. EXAM: ULTRASOUND ABDOMEN LIMITED RIGHT UPPER QUADRANT COMPARISON:  CT abdomen and pelvis dated August 28, 2019. FINDINGS: Gallbladder: Percutaneous cholecystostomy tube in place. Small stones and sludge. No wall thickening visualized. No sonographic Murphy sign noted by sonographer. Common bile duct: Diameter: 6 mm, normal. Liver: No focal lesion identified. Nodular contour. Within normal limits in parenchymal echogenicity. Portal vein is patent on color Doppler imaging with normal direction of blood flow towards the liver. Other: Small perihepatic ascites.  Right pleural effusion. IMPRESSION: 1. No acute findings. 2. Percutaneous cholecystostomy tube in place. Cholelithiasis and sludge. 3. Cirrhosis with small perihepatic ascites. 4. Right pleural effusion. Electronically Signed   By: Titus Dubin M.D.   On: 10/05/2019 18:11     Assessment/Plan:  Some increased secretions this a.m. Was started back on IV Levaquin on Friday by CCM-White count has not increased over the past 2 days Continue vent support   Grace Isaac 10/07/2019 8:33 AMPatient ID: Todd [name], male   DOB: 1955-05-30, 65 y.o.   MRN: [id number]

## 2019-10-07 NOTE — Progress Notes (Signed)
NAME:  Todd [name], MRN:  [id number], DOB:  1955/04/21, LOS: 57 ADMISSION DATE:  07/26/2019, CONSULTATION DATE:  08/03/2019 REFERRING MD:  Roxan Hockey, CHIEF COMPLAINT:  ECMO    Brief History   64 year old male with history of ischemic cardiomyopathy admitted with CHF exacerbation, found to have multi-vessel disease requiring CABG 6/24, post op VT arrest 6/25 and cannulated for VA ECMO. S/p decannulation and impella. Hospital course has been complicated by cholecystitis, candidemia, and chronic hypoxic respiratory failure requiring tracheostomy.   Past Medical History   Past Medical History:  Diagnosis Date  . Arthritis   . CHF (congestive heart failure) ([place])   . Depression   . Diabetes mellitus without complication ([facility])   . Hard of hearing   . Hyperlipidemia   . Hypertension    Significant Hospital Events   6/24 CABG 6/25 VA cannulation 6/28 decannulated and impella placed 6/29 bedside re-exploration; s/p decannulation. Placement of impella device originally at p8 but decreased to p4 2/2 suction events overnight up to p6. Echo completed and repositioned device. Remained on considerable support with 8 epi and 46 norepi vaso 0.05. continued chest tube output with large clots noted. Heparin thru device. Replacement products ongoing. 60% 8 peep 6/30 bedside mediastinal re-exploration and clean out of hematoma with tamponade and worsening hemodynamics. Pt had large volume transfusion. rebolused with amio 2/2 nsvt episode.  Weight up 48 pounds.  Started diuresis, Lasix drip started. 7/01 iatrogenic respiratory alkalosis, vent rate decreased. 7/02 No significant issues overnight remains on pressors and Impella not tolerating tube feeds with high gastric output awaiting core track placement 7/03 started trickle TF  7/05 Swab removed, CVL and PICC placed. 7/13 Trach and BAL 7/16 Awake and tracking. No follow commands. Still having fevers. Successful ultrasound and CT guided placement of a  10.2 French cholecystostomy tube. A small amount of aspirated bile was capped and sent to the laboratory for analysis 7/17 Off sedation, PSV.  Beginning to follow some commands occasionally intermittently.  Very jaundiced.  Growing Candida in the blood and BAL > diflucan.   7/21 Ongoing fevers, WBC increased 7/22 Persistent fevers, pan cultured with CT Chest/Head. Suspected source tracheobronchitis.  7/28 Transitioned to eliquis, jaundiced but labs improving  7/29 Agitation/restless.  Trazodone added QHS. Weaning midodrine, attempting diuresis  7/30 Increased Na, weaning on PSV.  8/01 On 40% ATC, significantly improved mental status 8/1>> ongoing attempts at trach weaning complicated by weakness, secretion management, and anxiety 8/8 started back on meropenem and milrinone 8/12 patient making slow improvements, he has been able to tolerate short course ATC 8/13 Weaned off milrinone 8/14 Code blue called for hypoxemia leading to PEA arrest. ROSC achieved after 6 min. 8/15: In  morning patient is unfazed from his recent events and continues asking for water to drink. Awake, alert and follows commands. 8/16 seen by surg per wound care recs-->"Do not recommend any acute surgical intervention. Will ultimately defer wound care recommendations to the wound care team, but I think it is reasonable to decrease hydrotherapy to 3x per week. I would increase dressing changes to BID and continue santyl for enzymatic debridement". 8/17: Using albumin and Lasix to try to mobilize anasarca 8/18: Tracheostomy downsized to 7.5.  Attempting short trials of aerosol trach collar 8/19-8/20 Now in a negative fluid balance.  Sodium continuing to rise.  Holding diuretics continuing to work on physical therapy and reconditioning, got a unit of blood in am for hgb 7.3. F/U hgb was 6.8, had scant amt  of bloody tracheal out-put but not enough to account for hgb loss. No frank blood in stools. Got 2 more units of PRBCs. Hgb now  9.7. w/out obvious source. Complaining of more pain than last few days. More restless. Stopped arixtra again. Added fent patch for pain needs.   Consults:  Advanced Heart Failure PCCM TCTS  Procedures:  LUE PICC 7/5 >>7/21 R Brachial ALine 7/9 >>7/27 Chest Tubes >> out L IJ CVC 7/21 >>7/27 Peg tube 7/27 >> Tracheostomy downsized to 7.5 on 8/18  Significant Diagnostic Tests:  6/22 spirometry with restrictive physiology, preserved FEV1/FVC 6/18 echo: LVEF 48-54%, grade I diastolic dysfunction 6/27 US Abdomen cholelithiasis without cholecystitis. Mild wall thinking in setting of liver disease and ascites.  7/10 LE [place] negative for DVT  7/20 CT ABD w contrast >> stable position of the percutaneous cholecystostomy tube with complete decompression of the gallbladder, trace residual free fluid within the RUQ, decreased since prior, trace bilateral pleural effusions L>R, scattered areas of ground glass airspace disease within the RML, RLL 7/22 CT Chest w/o Contrast >> Retrosternal fluid collection, non specific.  Bilateral pleural effusions, portions loculated.   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 Stable bilateral mastoid and middle ear effusions. Right upper quadrant ultrasound 8/27 >> per cholecystostomy tube in place with some cholelithiasis and sludge, no dilation or wall thickening, cirrhosis  Micro Data:  7/3 RQ> Few candida tropicalis, likely contaminant 7/12 BAL Cx> Candida tropicalis and albicans, likely contaminant 7/14 BCx >> candida parapsilosis 7/17 BCx >> neg resp 8/7 >> stenotrophomonas resp 8/26> stenotrophomonas  Antimicrobials:  Meropenem 8/8 >>8/10 Diflucan  7/16 >> 8/9 vanc 7/22 >>8/10 levaquin 8/10 >> 8/23 levaquin 8/27 >>   Interim history/subjective:   Coughing, difficult to clear secretions.   Objective   Blood pressure (!) 125/59, pulse 82, temperature 98.2 F (36.8 C),  resp. rate 20, height _0  (1.753 m), weight 71.6 kg, SpO2 95 %.    Vent Mode: PRVC FiO2 (%):  [40 %] 40 % Set Rate:  [20 bmp] 20 bmp Vt Set:  [420 mL] 420 mL PEEP:  [5 cmH20] 5 cmH20 Pressure Support:  [8 cmH20-10 cmH20] 10 cmH20 Plateau Pressure:  [14 cmH20-18 cmH20] 18 cmH20   Intake/Output Summary (Last 24 hours) at 10/07/2019 0734 Last data filed at 10/07/2019 0441 Gross per 24 hour  Intake 1667.83 ml  Output 2570 ml  Net -902.17 ml   Filed Weights   10/05/19 0300 10/06/19 0500 10/07/19 0500  Weight: 75.4 kg 76.3 kg 71.6 kg    Examination: General: Ill-appearing elderly man on his right side HENT: Temporal wasting Neck: Trach in good position, clean dry Lungs: Diffuse bilateral inspiratory and expiratory rhonchi Cardiovascular: Regular, distant Abdomen: Nondistended, mild diffuse tenderness, gallbladder tube in place, clamped Extremities: No pretibial edema Neuro: Nodded to questions, globally weak, moves upper extremities  Resolved Hospital Problem list   Suspected HCAP Acute kidney injury Hypernatremia Acute Metabolic Encephalopathy  -CTH without acute intracranial pathology. EEG negative 7/23.  Shock Heparin-induced thrombocytopenia >> resolved, required bivalirudin 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 -Opthalmology evaluated , diflucan x 3 weeks PEA arrest 8/14 preceded by hypoxemia Assessment & Plan:  Acute hypoxic respiratory failure requiring mechanical ventilation with severe weakness/deconditioning Tracheostomy dependence; sedation minimized over the last 48 hours -Continue PSV as he can tolerate, has not done any trach collar this weekend -Trach order set as per protocol -VAP prevention orders -Levofloxacin restarted 8/27 for  stenotrophomonas on respiratory culture.  Question colonized -Chest PT  Hypervolemia with pulmonary congestion Improving with diuresis; tolerating some PSV -Has been diuresing, note evolving hypernatremia.  Plan to  hold diuretics 8/29, follow BMP and reassess daily -I/O -6.3 L total -Follow BMP, urine output  Cardiogenic/hemorrhagic shock s/p CABG complicated by VT arrest. Shock resolved. S/p VA ECMO (decannulated 6/28), s/p impella placement (6/28>7/9) Atrial flutter, reverted to NSR -Continue amiodarone  Hyperbilirubinemia 2/2 acalculous cholecystitis vs drug induced cholestasis S/p percutaneous GB drain by IR on 7/17 -Following LFT intermittently, recheck on 8/30 -Gallbladder drain is clamped, ultrasound 8/27 reassuring.  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 minimal output in biliary drain - would not account for degree of Hb drop. Suspect possible GI bleed given worsening azotemia.  -Hemoglobin stable today 8/29, has had evidence for slow blood loss without any clear GI source.  No endoscopy or colonoscopy performed thus far -Arixtra is on hold -Follow CBC, hemoglobin goal is 7.0  Azotemia, contribution presumed GI blood loss Mild acute renal insufficiency, suspect due to aggressive diuresis Hypernatremia, progressing 8/29 -Follow BMP -Continue free water, increase on 8/29.  Hold diuretics 8/29 and reassess 8/34 restart depending on chemistries  Best practice:  Diet: tube feeds Pain/Anxiety/Delirium protocol (if indicated): per protocol VAP protocol (if indicated): per protocol DVT prophylaxis: SCDs GI prophylaxis: PPI BID Glucose control: basal bolus insulin Mobility: PT/OT as able  Code Status: FULL Family Communication: per primary Disposition: ICU for vent management and weaning - awaiting LTACH placement   This patient is critically ill with multiple organ system failure which requires frequent high complexity decision making, assessment,  support, evaluation, and titration of therapies. This was completed through the application of advanced monitoring technologies and extensive interpretation of multiple databases. During this encounter critical care time was devoted to patient care services described in this note for 31 minutes.   Baltazar Apo, MD, PhD 10/07/2019, 7:34 AM [facility] [phone] or if no answer [phone]

## 2019-10-07 NOTE — Progress Notes (Signed)
Pt placed back on full vent support due to increased WOB and desaturation after 100% O2 breaths given and repeat suctioning.  PT seems to be tolerating full support better with increased sats of 96% and RR at 23.  RT will continue to monitor.

## 2019-10-07 NOTE — Progress Notes (Signed)
Patient ID: Todd [name], male   DOB: 03-22-1955, 64 y.o.   MRN: [id number] EVENING ROUNDS NOTE :     [address]       [place] [address]             [phone]                 51 Days Post-Op Procedure(s) (LRB): REMOVAL OF IMPELLA LEFT VENTRICULAR ASSIST DEVICE (N/A) TRANSESOPHAGEAL ECHOCARDIOGRAM (TEE) (N/A)  Total Length of Stay:  LOS: 73 days  BP (!) 111/53 (BP Location: Left Arm)   Pulse 80   Temp 98.4 F (36.9 C)   Resp 17   Ht 5\' 9"  (1.753 m)   Wt 71.6 kg   SpO2 100%   BMI 23.31 kg/m   .Intake/Output      08/28 0701 - 08/29 0700 08/29 0701 - 08/30 0700   I.V. (mL/kg) 0 (0)    Other  40   NG/GT 1517.8    IV Piggyback 150    Total Intake(mL/kg) 1667.8 (23.3) 40 (0.6)   Urine (mL/kg/hr) 2570 (1.5) 850 (1.1)   Drains  50   Total Output 2570 900   Net -902.2 -860          . feeding supplement (PIVOT 1.5 CAL) 1,000 mL (10/07/19 0040)  . levofloxacin (LEVAQUIN) IV 750 mg (10/07/19 1631)     Lab Results  Component Value Date   WBC 13.4 (H) 10/07/2019   HGB 8.2 (L) 10/07/2019   HCT 28.0 (L) 10/07/2019   PLT 221 10/07/2019   GLUCOSE 264 (H) 10/07/2019   CHOL 203 (H) 07/27/2019   TRIG 213 (H) 08/27/2019   HDL 34 (L) 07/27/2019   LDLCALC 148 (H) 07/27/2019   ALT 77 (H) 10/06/2019   AST 109 (H) 10/06/2019   NA 155 (H) 10/07/2019   K 3.8 10/07/2019   CL 108 10/07/2019   CREATININE 1.17 10/07/2019   BUN 132 (H) 10/07/2019   CO2 42 (H) 10/07/2019   TSH 2.243 07/31/2019   INR 1.3 (H) 09/27/2019   HGBA1C 5.3 09/25/2019   Limited weaning of vent today, only 1 hr cpap today comparied to 12 yesterday    09/27/2019 MD  Beeper [phone] Office [phone] 10/07/2019 5:31 PM

## 2019-10-08 ENCOUNTER — Inpatient Hospital Stay (HOSPITAL_COMMUNITY): Payer: Medicare Other

## 2019-10-08 LAB — COMPREHENSIVE METABOLIC PANEL
ALT: 110 U/L — ABNORMAL HIGH (ref 0–44)
AST: 143 U/L — ABNORMAL HIGH (ref 15–41)
Albumin: 1.6 g/dL — ABNORMAL LOW (ref 3.5–5.0)
Alkaline Phosphatase: 1054 U/L — ABNORMAL HIGH (ref 38–126)
Anion gap: 9 (ref 5–15)
BUN: 117 mg/dL — ABNORMAL HIGH (ref 8–23)
CO2: 40 mmol/L — ABNORMAL HIGH (ref 22–32)
Calcium: 8.3 mg/dL — ABNORMAL LOW (ref 8.9–10.3)
Chloride: 109 mmol/L (ref 98–111)
Creatinine, Ser: 1.1 mg/dL (ref 0.61–1.24)
GFR calc Af Amer: >60 mL/min (ref >60)
GFR calc non Af Amer: >60 mL/min (ref >60)
Glucose, Bld: 278 mg/dL — ABNORMAL HIGH (ref 70–99)
Potassium: 3.9 mmol/L (ref 3.5–5.1)
Sodium: 158 mmol/L — ABNORMAL HIGH (ref 135–145)
Total Bilirubin: 4.3 mg/dL — ABNORMAL HIGH (ref 0.3–1.2)
Total Protein: 6.3 g/dL — ABNORMAL LOW (ref 6.5–8.1)

## 2019-10-08 LAB — CBC
HCT: 27.8 % — ABNORMAL LOW (ref 39.0–52.0)
HCT: 29.1 % — ABNORMAL LOW (ref 39.0–52.0)
Hemoglobin: 7.9 g/dL — ABNORMAL LOW (ref 13.0–17.0)
Hemoglobin: 8.3 g/dL — ABNORMAL LOW (ref 13.0–17.0)
MCH: 27.8 pg (ref 26.0–34.0)
MCH: 27.8 pg (ref 26.0–34.0)
MCHC: 28.4 g/dL — ABNORMAL LOW (ref 30.0–36.0)
MCHC: 28.5 g/dL — ABNORMAL LOW (ref 30.0–36.0)
MCV: 97.9 fL (ref 80.0–100.0)
MCV: 97.3 fL (ref 80.0–100.0)
Platelets: 234 10*3/uL (ref 150–400)
Platelets: 247 10*3/uL (ref 150–400)
RBC: 2.84 MIL/uL — ABNORMAL LOW (ref 4.22–5.81)
RBC: 2.99 MIL/uL — ABNORMAL LOW (ref 4.22–5.81)
RDW: 21.2 % — ABNORMAL HIGH (ref 11.5–15.5)
RDW: 21.7 % — ABNORMAL HIGH (ref 11.5–15.5)
WBC: 12.9 10*3/uL — ABNORMAL HIGH (ref 4.0–10.5)
WBC: 15.7 10*3/uL — ABNORMAL HIGH (ref 4.0–10.5)
nRBC: 0 % (ref 0.0–0.2)
nRBC: 0 % (ref 0.0–0.2)

## 2019-10-08 LAB — GLUCOSE, CAPILLARY
Glucose-Capillary: 105 mg/dL — ABNORMAL HIGH (ref 70–99)
Glucose-Capillary: 130 mg/dL — ABNORMAL HIGH (ref 70–99)
Glucose-Capillary: 132 mg/dL — ABNORMAL HIGH (ref 70–99)
Glucose-Capillary: 246 mg/dL — ABNORMAL HIGH (ref 70–99)
Glucose-Capillary: 252 mg/dL — ABNORMAL HIGH (ref 70–99)
Glucose-Capillary: 60 mg/dL — ABNORMAL LOW (ref 70–99)
Glucose-Capillary: 63 mg/dL — ABNORMAL LOW (ref 70–99)

## 2019-10-08 LAB — MAGNESIUM: Magnesium: 2.6 mg/dL — ABNORMAL HIGH (ref 1.7–2.4)

## 2019-10-08 MED ORDER — IOHEXOL 300 MG/ML  SOLN
50.0000 mL | Freq: Once | INTRAMUSCULAR | Status: AC | PRN
  Administered 2019-10-08: 10 mL

## 2019-10-08 MED ORDER — DEXTROSE 50 % IV SOLN
12.5000 g | INTRAVENOUS | Status: AC
  Administered 2019-10-08: 12.5 g via INTRAVENOUS
  Filled 2019-10-08: qty 50

## 2019-10-08 MED ORDER — LIDOCAINE HCL 1 % IJ SOLN
INTRAMUSCULAR | Status: AC
  Filled 2019-10-08: qty 20

## 2019-10-08 MED ORDER — INSULIN ASPART 100 UNIT/ML ~~LOC~~ SOLN
4.0000 [IU] | SUBCUTANEOUS | Status: DC
  Administered 2019-10-08 – 2019-10-09 (×4): 4 [IU] via SUBCUTANEOUS

## 2019-10-08 MED ORDER — HYDROCERIN EX CREA
TOPICAL_CREAM | Freq: Every day | CUTANEOUS | Status: DC
  Filled 2019-10-08: qty 113

## 2019-10-08 NOTE — Procedures (Signed)
Interventional Radiology Procedure Note  Procedure: cholecystostomy injection     Complications: None  Estimated Blood Loss:  min  Findings: Cholecystostomy tube has retracted out of the GB into the surrounding perihepatic ascites.  Tube was removed.  Quick GB US shows stones and sludge without distention  Observe for signs of recurrent cholecystitis and if so consider replacement.      Sharen Counter, MD

## 2019-10-08 NOTE — Progress Notes (Signed)
64 y.o. make history of CHF exacerbation, cardiogenic/ hemorrhagic shock s/p CABG on 6.24.21 complicate by VT arrest, ECMO (decannualted on 6.28.21), Impella placement (removed on 7.9.21) with ongoing hyperbilirubinemia and  leukocytosis despite antibiotics. Patient was found to have what was thought to be acute acalculous cholecystitis s/p cholecystomy tube placed on 7.16.21 By Dr. Odis Luster via a transhepatic approach. WBC is down trending 12.9 < 13.5<  13.4 < 14.2. Hgb 7.9. Total bilirubin 4.3. Patient is a febrile  Per Team, the Patient's output recently changed from bile to sanguinous in nature. Patient brought down for cholangiogram of existing cholecystomy tube. Results showed the existing drain in the peritoneum. US performed by Dr. Collier Salina in IR suite shows cholelithiasis with a decompressed gallbladder.. Existing malpositioned cholecystomy tube removed in IR suite. Team requested to monitor patient for any signs of cholecystitis and notify IR should any signs of infection reoccur. This was communicated directly to the Team by Dr.T. Shick. Team is in agreement with the plan of care.

## 2019-10-08 NOTE — Progress Notes (Signed)
Inpatient Diabetes Program Recommendations  AACE/ADA: New Consensus Statement on Inpatient Glycemic Control (2015)  Target Ranges:  Prepandial:   less than 140 mg/dL      Peak postprandial:   less than 180 mg/dL (1-2 hours)      Critically ill patients:  140 - 180 mg/dL   Lab Results  Component Value Date   GLUCAP 132 (H) 10/08/2019   HGBA1C 5.3 09/25/2019    Review of Glycemic Control  Given glucose trends in 130's mg/dL, would be hesitant with Novolog 4 units Q4H. Consider reducing to Novolog 2 Q4H. Secure chat sent to PA. Thanks,  Lujean Rave, MSN, RNC-OB Diabetes Coordinator [phone] (8a-5p)

## 2019-10-08 NOTE — Progress Notes (Addendum)
Referring Physician(s): Dr Sunday Corn  Supervising Physician: Ruel Favors  Patient Status:  [facility] - In-pt  Chief Complaint:  Perc chole drain placed 08/24/19 in IR  Subjective:  Perc chole drain intact Flushes easily OP is bloody in bag  -- 600 cc in bag now OP remained bile like til recently Noted bloody OP maybe 3-4 days ago per RN Drop in Hg 4 days ago 8/23: 9.2 (6.8 8/26);  Now 8.2 after 2 u RBCs   Allergies: Empagliflozin, Heparin, Other, Simvastatin, and Sitagliptin  Medications: Prior to Admission medications   Medication Sig Start Date End Date Taking? Authorizing Provider  citalopram (CELEXA) 40 MG tablet Take 40 mg by mouth daily.   Yes [provider]  dapagliflozin propanediol (FARXIGA) 10 MG TABS tablet Take 10 mg by mouth daily.   Yes [provider]  ergocalciferol (VITAMIN D2) 1.25 MG (50000 UT) capsule Take 50,000 Units by mouth 2 (two) times a week.   Yes [provider]  furosemide (LASIX) 20 MG tablet Take 40 mg by mouth daily. 07/16/19  Yes [provider]  glipiZIDE-metformin (METAGLIP) 2.5-500 MG tablet Take 2 tablets by mouth 2 (two) times daily with a meal.   Yes [provider]  ibuprofen (ADVIL) 200 MG tablet Take 400 mg by mouth 2 (two) times daily as needed for moderate pain.   Yes [provider]  levofloxacin (LEVAQUIN) 750 MG tablet Take 750 mg by mouth daily. For 14 days.   Yes [provider]  lisinopril (ZESTRIL) 20 MG tablet Take 20 mg by mouth daily.   Yes [provider]  oxyCODONE (ROXICODONE) 15 MG immediate release tablet Take 15 mg by mouth 3 (three) times daily.   Yes [provider]  pioglitazone (ACTOS) 45 MG tablet Take 45 mg by mouth daily.   Yes [provider]  promethazine (PHENERGAN) 25 MG tablet Take 25 mg by mouth 2 (two) times daily as needed for nausea or vomiting.   Yes [provider]     Vital Signs: BP (!) 127/56  (BP Location: Left Arm)   Pulse (!) 102   Temp 98.5 F (36.9 C) (Oral)   Resp 17   Ht 5\' 9"  (1.753 m)   Wt 161 lb 2.5 oz (73.1 kg)   SpO2 99%   BMI 23.80 kg/m   Physical Exam Skin:    General: Skin is warm.     Comments: Skin site at drain is clean and dry NT no bleeding  Flushes easily-- aspirate is blood tinged  OP is frank blood in bag Over 600 cc (emptied into container in bathroom- left for RN to measure and record)       Imaging: DG Chest Port 1 View  Result Date: 10/08/2019 CLINICAL DATA:  Respiratory failure, tracheostomy. EXAM: PORTABLE CHEST 1 VIEW COMPARISON:  October 07, 2019. FINDINGS: Stable cardiomediastinal silhouette. Tracheostomy tube is unchanged in position. Right-sided PICC line is unchanged in position. No pneumothorax is noted. Stable bilateral lung opacities are noted concerning for pneumonia. Possible small right pleural effusion is noted. Bony thorax is unremarkable. IMPRESSION: Stable support apparatus. Stable bilateral lung opacities are noted concerning for pneumonia. Electronically Signed   By: October 09, 2019 M.D.   On: 10/08/2019 08:43   DG Chest Port 1 View  Result Date: 10/07/2019 CLINICAL DATA:  Prior CABG procedure. Evaluate pleural effusions and pulmonary infiltrates. EXAM: PORTABLE CHEST 1 VIEW COMPARISON:  10/05/2019 FINDINGS: Previous median sternotomy and CABG procedure. Tracheostomy  tube tip is above the carina. Right arm PICC line tip is at the cavoatrial junction. Stable cardiomediastinal contours. Left pleural effusion which appears partially loculated is similar in volume to the previous exam. Diffuse bilateral interstitial and airspace densities are unchanged. IMPRESSION: 1. No change in aeration to the lungs compared with previous exam. 2. No change in loculated left pleural effusion. Electronically Signed   By: Signa Kell M.D.   On: 10/07/2019 09:32   DG Chest Port 1 View  Result Date: 10/05/2019 CLINICAL DATA:  Tracheostomy.   Respiratory failure. EXAM: PORTABLE CHEST 1 VIEW COMPARISON:  10/04/2019. FINDINGS: Tracheostomy tube and right PICC line in stable position. Prior CABG. Stable cardiomegaly. Low lung volumes. Diffuse severe bilateral pulmonary infiltrates/edema again noted. Left pleural effusion again noted. No pneumothorax. IMPRESSION: 1.  Lines and tubes stable position. 2.  Prior CABG.  Stable cardiomegaly. 3. Low lung volumes. Diffuse severe bilateral pulmonary infiltrates/edema again noted. Left pleural effusion again noted. Similar findings noted on prior exam. Electronically Signed   By: Maisie Fus  Register   On: 10/05/2019 06:53   US Abdomen Limited RUQ  Result Date: 10/05/2019 CLINICAL DATA:  Elevated bilirubin. EXAM: ULTRASOUND ABDOMEN LIMITED RIGHT UPPER QUADRANT COMPARISON:  CT abdomen and pelvis dated August 28, 2019. FINDINGS: Gallbladder: Percutaneous cholecystostomy tube in place. Small stones and sludge. No wall thickening visualized. No sonographic Murphy sign noted by sonographer. Common bile duct: Diameter: 6 mm, normal. Liver: No focal lesion identified. Nodular contour. Within normal limits in parenchymal echogenicity. Portal vein is patent on color Doppler imaging with normal direction of blood flow towards the liver. Other: Small perihepatic ascites.  Right pleural effusion. IMPRESSION: 1. No acute findings. 2. Percutaneous cholecystostomy tube in place. Cholelithiasis and sludge. 3. Cirrhosis with small perihepatic ascites. 4. Right pleural effusion. Electronically Signed   By: Obie Dredge M.D.   On: 10/05/2019 18:11    Labs:  CBC: Recent Labs    10/06/19 0509 10/07/19 0422 10/07/19 2237 10/08/19 0402  WBC 14.2* 13.4* 13.5* 12.9*  HGB 8.2* 8.2* 7.6* 7.9*  HCT 26.9* 28.0* 26.5* 27.8*  PLT 236 221 228 234    COAGS: Recent Labs    08/20/19 1202 08/21/19 0406 09/04/19 0049 09/05/19 0040 09/21/19 0152 09/27/19 2049  INR 1.6*  --  1.4*  --  1.3* 1.3*  APTT  --    < > 54* 66* 42* 42*    < > = values in this interval not displayed.    BMP: Recent Labs    10/06/19 0509 10/07/19 0422 10/07/19 2237 10/08/19 0402  NA 153* 155* 157* 158*  K 3.5 3.8 3.9 3.9  CL 105 108 108 109  CO2 39* 42* 41* 40*  GLUCOSE 224* 264* 303* 278*  BUN 137* 132* 123* 117*  CALCIUM 8.3* 8.4* 8.2* 8.3*  CREATININE 1.29* 1.17 1.00 1.10  GFRNONAA 58* >60 >60 >60  GFRAA >60 >60 >60 >60    LIVER FUNCTION TESTS: Recent Labs    10/04/19 0434 10/05/19 0510 10/06/19 0509 10/08/19 0402  BILITOT 5.4* 6.2* 5.4* 4.3*  AST 97* 88* 109* 143*  ALT 81* 77* 77* 110*  ALKPHOS 713* 702* 901* 1,054*  PROT 5.9* 6.0* 6.1* 6.3*  ALBUMIN 1.6* 1.6* 1.6* 1.6*    Assessment and Plan:  Percutaneous cholecystostomy drain in place Placed 7/16 in IR Flushes easily-- aspirate blood tinged OP in bag is frank blood and drop in Hg noted 4 days ago.  IR will perform drain injection to confirm location and  function Drain needs to remain for 6-8 weeks minimum  Plan per CCM    Electronically Signed: Robet Leu, PA-C 10/08/2019, 12:44 PM   I spent a total of 15 Minutes at the the patient's bedside AND on the patient's hospital floor or unit, greater than 50% of which was counseling/coordinating care for perc chole drain injection

## 2019-10-08 NOTE — Progress Notes (Signed)
NAME:  Todd [name], MRN:  [id number], DOB:  14-Nov-1955, LOS: 5 ADMISSION DATE:  07/26/2019, CONSULTATION DATE:  08/03/2019 REFERRING MD:  Roxan Hockey, CHIEF COMPLAINT:  ECMO    Brief History   64 year old male with history of ischemic cardiomyopathy admitted with CHF exacerbation, found to have multi-vessel disease requiring CABG 6/24, post op VT arrest 6/25 and cannulated for VA ECMO. S/p decannulation and impella. Hospital course has been complicated by cholecystitis, candidemia, and chronic hypoxic respiratory failure requiring tracheostomy.   Past Medical History   Past Medical History:  Diagnosis Date  . Arthritis   . CHF (congestive heart failure) ([place])   . Depression   . Diabetes mellitus without complication ([place])   . Hard of hearing   . Hyperlipidemia   . Hypertension    Significant Hospital Events   6/24 CABG 6/25 VA cannulation 6/28 decannulated and impella placed 6/29 bedside re-exploration; s/p decannulation. Placement of impella device originally at p8 but decreased to p4 2/2 suction events overnight up to p6. Echo completed and repositioned device. Remained on considerable support with 8 epi and 46 norepi vaso 0.05. continued chest tube output with large clots noted. Heparin thru device. Replacement products ongoing. 60% 8 peep 6/30 bedside mediastinal re-exploration and clean out of hematoma with tamponade and worsening hemodynamics. Pt had large volume transfusion. rebolused with amio 2/2 nsvt episode.  Weight up 48 pounds.  Started diuresis, Lasix drip started. 7/01 iatrogenic respiratory alkalosis, vent rate decreased. 7/02 No significant issues overnight remains on pressors and Impella not tolerating tube feeds with high gastric output awaiting core track placement 7/03 started trickle TF  7/05 Swab removed, CVL and PICC placed. 7/13 Trach and BAL 7/16 Awake and tracking. No follow commands. Still having fevers. Successful ultrasound and CT guided placement of a  10.2 French cholecystostomy tube. A small amount of aspirated bile was capped and sent to the laboratory for analysis 7/17 Off sedation, PSV.  Beginning to follow some commands occasionally intermittently.  Very jaundiced.  Growing Candida in the blood and BAL > diflucan.   7/21 Ongoing fevers, WBC increased 7/22 Persistent fevers, pan cultured with CT Chest/Head. Suspected source tracheobronchitis.  7/28 Transitioned to eliquis, jaundiced but labs improving  7/29 Agitation/restless.  Trazodone added QHS. Weaning midodrine, attempting diuresis  7/30 Increased Na, weaning on PSV.  8/01 On 40% ATC, significantly improved mental status 8/1>> ongoing attempts at trach weaning complicated by weakness, secretion management, and anxiety 8/8 started back on meropenem and milrinone 8/12 patient making slow improvements, he has been able to tolerate short course ATC 8/13 Weaned off milrinone 8/14 Code blue called for hypoxemia leading to PEA arrest. ROSC achieved after 6 min. 8/15: In  morning patient is unfazed from his recent events and continues asking for water to drink. Awake, alert and follows commands. 8/16 seen by surg per wound care recs-->"Do not recommend any acute surgical intervention. Will ultimately defer wound care recommendations to the wound care team, but I think it is reasonable to decrease hydrotherapy to 3x per week. I would increase dressing changes to BID and continue santyl for enzymatic debridement". 8/17: Using albumin and Lasix to try to mobilize anasarca 8/18: Tracheostomy downsized to 7.5.  Attempting short trials of aerosol trach collar 8/19-8/20 Now in a negative fluid balance.  Sodium continuing to rise.  Holding diuretics continuing to work on physical therapy and reconditioning, got a unit of blood in am for hgb 7.3. F/U hgb was 6.8, had scant amt  of bloody tracheal out-put but not enough to account for hgb loss. No frank blood in stools. Got 2 more units of PRBCs. Hgb now  9.7. w/out obvious source. Complaining of more pain than last few days. More restless. Stopped arixtra again. Added fent patch for pain needs.   Consults:  Advanced Heart Failure PCCM TCTS  Procedures:  LUE PICC 7/5 >>7/21 R Brachial ALine 7/9 >>7/27 Chest Tubes >> out L IJ CVC 7/21 >>7/27 Peg tube 7/27 >> Tracheostomy downsized to 7.5 on 8/18  Significant Diagnostic Tests:  6/22 spirometry with restrictive physiology, preserved FEV1/FVC 6/18 echo: LVEF 24-23%, grade I diastolic dysfunction 5/36 US Abdomen cholelithiasis without cholecystitis. Mild wall thinking in setting of liver disease and ascites.  7/10 LE [place] negative for DVT  7/20 CT ABD w contrast >> stable position of the percutaneous cholecystostomy tube with complete decompression of the gallbladder, trace residual free fluid within the RUQ, decreased since prior, trace bilateral pleural effusions L>R, scattered areas of ground glass airspace disease within the RML, RLL 7/22 CT Chest w/o Contrast >> Retrosternal fluid collection, non specific.  Bilateral pleural effusions, portions loculated.   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 Stable bilateral mastoid and middle ear effusions. Right upper quadrant ultrasound 8/27 >> per cholecystostomy tube in place with some cholelithiasis and sludge, no dilation or wall thickening, cirrhosis  Micro Data:  7/3 RQ> Few candida tropicalis, likely contaminant 7/12 BAL Cx> Candida tropicalis and albicans, likely contaminant 7/14 BCx >> candida parapsilosis 7/17 BCx >> neg resp 8/7 >> stenotrophomonas resp 8/26> stenotrophomonas  Antimicrobials:  Meropenem 8/8 >>8/10 Diflucan  7/16 >> 8/9 vanc 7/22 >>8/10 levaquin 8/10 >> 8/23 levaquin 8/27 >>   Interim history/subjective:  Awake, NAEON.  Objective   Blood pressure 130/73, pulse 74, temperature (!) 97.2 F (36.2 C), temperature source Axillary,  resp. rate (!) 21, height _0  (1.753 m), weight 73.1 kg, SpO2 99 %.    Vent Mode: PRVC FiO2 (%):  [40 %-50 %] 50 % Set Rate:  [20 bmp] 20 bmp Vt Set:  [420 mL] 420 mL PEEP:  [5 cmH20] 5 cmH20 Pressure Support:  [10 cmH20] 10 cmH20 Plateau Pressure:  [18 cmH20-20 cmH20] 18 cmH20   Intake/Output Summary (Last 24 hours) at 10/08/2019 1443 Last data filed at 10/08/2019 0600 Gross per 24 hour  Intake 1435 ml  Output 2675 ml  Net -1240 ml   Filed Weights   10/06/19 0500 10/07/19 0500 10/08/19 0358  Weight: 76.3 kg 71.6 kg 73.1 kg    Examination: General: Ill-appearing elderly man, resting comfortably HENT: Temporal wasting Neck: Trach in good position, clean dry Lungs: Diffuse bilateral rhonchi Cardiovascular: Regular, distant Abdomen: Nondistended, mild diffuse tenderness, gallbladder tube in place, clamped Extremities: 1+ edema Neuro: Nodded to questions, globally weak, moves upper extremities  Resolved Hospital Problem list   Suspected HCAP Acute kidney injury Hypernatremia Acute Metabolic Encephalopathy  -CTH without acute intracranial pathology. EEG negative 7/23.  Shock Heparin-induced thrombocytopenia >> resolved, required bivalirudin 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 -Opthalmology evaluated , diflucan x 3 weeks PEA arrest 8/14 preceded by hypoxemia Assessment & Plan:   Acute hypoxic respiratory failure requiring mechanical ventilation with severe weakness/deconditioning Tracheostomy dependence; sedation minimized over the last 48 hours ? stenotrophomonas PNA vs colonization - restarted on Levaquin 8/27 -Continue PSV as he can tolerate with ATC as able -Trach order set as per protocol -VAP prevention orders -Continue levaquin for now -  Chest PT  Hypervolemia with pulmonary congestion - gradually improving. Improving with diuresis; tolerating some PSV - Holding further lasix given worsening hypernatremia, assess daily -Follow BMP, urine  output  Cardiogenic/hemorrhagic shock s/p CABG complicated by VT arrest. Shock resolved. S/p VA ECMO (decannulated 6/28), s/p impella placement (6/28>7/9) Atrial flutter, reverted to NSR -Continue amiodarone  Hyperbilirubinemia 2/2 acalculous cholecystitis vs drug induced cholestasis S/p percutaneous GB drain by IR on 7/17 -Following LFT intermittently -Gallbladder drain is clamped, ultrasound 8/27 reassuring.  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 minimal output in biliary drain - would not account for degree of Hb drop. Consider possible GI bleed given worsening azotemia. Hgb has remained stable -Arixtra is on hold -Follow CBC, hemoglobin goal is 7.0  Hypernatremia -Follow BMP -Continue free water.  Hold diuretics 8/30 and reassess 8/31 restart depending on chemistries  Best practice:   Diet: tube feeds Pain/Anxiety/Delirium protocol (if indicated): per protocol VAP protocol (if indicated): per protocol DVT prophylaxis: SCDs GI prophylaxis: PPI BID Glucose control: SSI + levemir Mobility: PT/OT as able  Code Status: FULL Family Communication: per primary Disposition: ICU for vent management and weaning - awaiting LTACH placement  CC time: 35 min.   Montey Hora, [facility] 10/08/2019, 7:45 AM

## 2019-10-08 NOTE — Progress Notes (Signed)
52 Days Post-Op Procedure(s) (LRB): REMOVAL OF IMPELLA LEFT VENTRICULAR ASSIST DEVICE (N/A) TRANSESOPHAGEAL ECHOCARDIOGRAM (TEE) (N/A) Subjective: Alert, c/o pain in upper abdomen  Objective: Vital signs in last 24 hours: Temp:  [97.2 F (36.2 C)-98.4 F (36.9 C)] 97.2 F (36.2 C) (08/30 0408) Pulse Rate:  [65-82] 76 (08/30 1000) Cardiac Rhythm: Normal sinus rhythm (08/30 0400) Resp:  [16-26] 16 (08/30 1000) BP: (103-140)/(44-94) 140/64 (08/30 1000) SpO2:  [89 %-100 %] 100 % (08/30 1000) FiO2 (%):  [50 %] 50 % (08/30 0747) Weight:  [73.1 kg] 73.1 kg (08/30 0358)  Hemodynamic parameters for last 24 hours:    Intake/Output from previous day: 08/29 0701 - 08/30 0700 In: 1435 [NG/GT:1395] Out: 2675 [Urine:1975; Drains:700] Intake/Output this shift: No intake/output data recorded.  General appearance: alert, cooperative and no distress Neurologic: intact Heart: regular rate and rhythm Lungs: rhonchi bilaterally Abdomen: mildly tender Wound: clean and dry  Lab Results: Recent Labs    10/07/19 2237 10/08/19 0402  WBC 13.5* 12.9*  HGB 7.6* 7.9*  HCT 26.5* 27.8*  PLT 228 234   BMET:  Recent Labs    10/07/19 2237 10/08/19 0402  NA 157* 158*  K 3.9 3.9  CL 108 109  CO2 41* 40*  GLUCOSE 303* 278*  BUN 123* 117*  CREATININE 1.00 1.10  CALCIUM 8.2* 8.3*    PT/INR: No results for input(s): LABPROT, INR in the last 72 hours. ABG    Component Value Date/Time   PHART 7.366 09/22/2019 2147   HCO3 36.7 (H) 10/02/2019 0408   TCO2 41 (H) 09/22/2019 2147   ACIDBASEDEF 1.0 08/08/2019 1115   O2SAT 74.0 10/06/2019 0513   CBG (last 3)  Recent Labs    10/08/19 0051 10/08/19 0405 10/08/19 0759  GLUCAP 246* 252* 130*    Assessment/Plan: S/P Procedure(s) (LRB): REMOVAL OF IMPELLA LEFT VENTRICULAR ASSIST DEVICE (N/A) TRANSESOPHAGEAL ECHOCARDIOGRAM (TEE) (N/A) - NEURO- intact  CV- stable  Has been in SR- will stop amiodarone and observe rhythm RESP- VDRF-  continue with CPAP trials  Improved LLL aeration, still bilateral AS disease RENAL- creatinine OK, BUN down slightly  Hypernatremia- free water increased yesterday ENDO- CBG elevated yesterday, better this AM GI- tolerating TF  Bili stable but Alk Phos trending upward significantly  Will place perc drain back to drainage ID- afebrile off antibiotics Deconditioning- severe   LOS: 74 days    Melrose Nakayama 10/08/2019

## 2019-10-08 NOTE — Plan of Care (Signed)
  Problem: Clinical Measurements: Goal: Ability to maintain clinical measurements within normal limits will improve Outcome: Progressing Goal: Will remain free from infection Outcome: Progressing Goal: Diagnostic test results will improve Outcome: Progressing Goal: Cardiovascular complication will be avoided Outcome: Progressing   Problem: Activity: Goal: Risk for activity intolerance will decrease Outcome: Progressing   Problem: Nutrition: Goal: Adequate nutrition will be maintained Outcome: Progressing   Problem: Coping: Goal: Level of anxiety will decrease Outcome: Progressing   Problem: Elimination: Goal: Will not experience complications related to urinary retention Outcome: Progressing   Problem: Pain Managment: Goal: General experience of comfort will improve Outcome: Progressing   Problem: Safety: Goal: Ability to remain free from injury will improve Outcome: Progressing   Problem: Cardiac: Goal: Will achieve and/or maintain hemodynamic stability Outcome: Progressing   Problem: Urinary Elimination: Goal: Ability to achieve and maintain adequate renal perfusion and functioning will improve Outcome: Progressing   Problem: Education: Goal: Knowledge of General Education information will improve Description: Including pain rating scale, medication(s)/side effects and non-pharmacologic comfort measures Outcome: Not Progressing   Problem: Health Behavior/Discharge Planning: Goal: Ability to manage health-related needs will improve Outcome: Not Progressing   Problem: Clinical Measurements: Goal: Respiratory complications will improve Outcome: Not Progressing   Problem: Elimination: Goal: Will not experience complications related to bowel motility Outcome: Not Progressing   Problem: Skin Integrity: Goal: Risk for impaired skin integrity will decrease Outcome: Not Progressing   Problem: Education: Goal: Ability to demonstrate management of disease  process will improve Outcome: Not Progressing Goal: Ability to verbalize understanding of medication therapies will improve Outcome: Not Progressing Goal: Individualized Educational Video(s) Outcome: Not Progressing   Problem: Activity: Goal: Capacity to carry out activities will improve Outcome: Not Progressing   Problem: Cardiac: Goal: Ability to achieve and maintain adequate cardiopulmonary perfusion will improve Outcome: Not Progressing   Problem: Education: Goal: Will demonstrate proper wound care and an understanding of methods to prevent future damage Outcome: Not Progressing Goal: Knowledge of disease or condition will improve Outcome: Not Progressing Goal: Knowledge of the prescribed therapeutic regimen will improve Outcome: Not Progressing Goal: Individualized Educational Video(s) Outcome: Not Progressing   Problem: Activity: Goal: Risk for activity intolerance will decrease Outcome: Not Progressing   Problem: Clinical Measurements: Goal: Postoperative complications will be avoided or minimized Outcome: Not Progressing   Problem: Respiratory: Goal: Respiratory status will improve Outcome: Not Progressing   Problem: Skin Integrity: Goal: Wound healing without signs and symptoms of infection Outcome: Not Progressing

## 2019-10-08 NOTE — Progress Notes (Signed)
CT surgery p.m. Rounds  Patient peers comfortable on ventilator after transport back from IR Gall bladder drain removed. Drain apparently became dislodged from the gallbladder and drained 90s earlier today CBC ordered to check current hemoglobin following procedure Vitals:   10/08/19 1500 10/08/19 1600  BP: 133/61 (!) 142/70  Pulse:  75  Resp: 18 (!) 25  Temp:    SpO2:  100%

## 2019-10-08 NOTE — Consult Note (Addendum)
WOC Nurse Consult Note: Reason for Consult: Chronic, nonhealing Stage 4 pressure Injury. Current POC includes twice daily collagenase ointment and M/W/F hydrotherapy. Patient is on a mattress replacement with low air loss feature. He assists with turning and repositioning. The FlexiSeal FMS is still in place and there is seepage around it in a small amount. Wound type:Pressure Pressure Injury POA: No Measurement: 6cm x 5.5cm x 2cm with near circumferential undermining measuring from 1.5cm to nearly 3cm at 3 o'clock. Wound bed: Pale pink with fibrinous white slough Drainage (amount, consistency, odor) small to moderate amount of light yellow exudate, no odor Periwound: Surface erosion of skin extending beyond wound margin by 2-3.5cm. Wound bed in these areas is pale, moist. Dressing procedure/placement/frequency: Continue POC with three times weekly hydrotherapy and twice daily collagenase ointment.   Right index finger is unchanged. Black eschar at tip and extends beneath nail. Continue treatment plan using daily painting with betadine swabstick.  Note: Bilateral heels are dry and intact. Patient prefers floatation via pillows over [employer]. Will add daily moisturizing to heels with Eucerin cream.   WOC nursing team will continue to follow, seeing patient every 7-10 days and will remain available to this patient, the nursing and medical teams.  Please re-consult if needed in between visits.  Thanks, Ladona Mow, MSN, RN, GNP, [place]  Pager# [phone]

## 2019-10-08 NOTE — Progress Notes (Signed)
Pt transported on ventilator from Ir to 2H02 with RT, RN and transport. Vital signs remained stable throughout. RT will continue to monitor.

## 2019-10-08 NOTE — Progress Notes (Signed)
OT Cancellation Note  Patient Details Name: Todd [name] MRN: [id number] DOB: 16-Jun-1955   Cancelled Treatment:    Reason Eval/Treat Not Completed: Patient at procedure or test/ unavailable (Hydrotherapy). Pt will need new goals at next OT session. Pt on sternal precautions until 9/24 unless cleared by MD (watch pulling)  Evern Bio [name] 10/08/2019, 11:02 AM   Nyoka Cowden OTR/L Acute Rehabilitation Services Pager: [phone] Office: [phone]

## 2019-10-08 NOTE — Progress Notes (Signed)
Pt has had incident of 1000 mL of bloody gallbladder drainage output within the last 6 hours. Drainage unclamped since 11 am. Notified Dr. Katrinka Blazing. Also, reported to IR. Radiology to have someone come see patient.   Update 1500h: 1500 ml of bloody drainage output noted total. Patient taken to IR for further assessment/intervention.

## 2019-10-08 NOTE — Progress Notes (Signed)
Physical Therapy Wound Treatment Patient Details  Name: Todd [name] MRN: [id number] Date of Birth: January 15, 1956  Today's Date: 10/08/2019 Time: 1025-1118 Time Calculation (min): 53 min  Subjective  Subjective: Pt very engaged today, seeming understanding to what is going on  Patient and Family Stated Goals: unable  Date of Onset:  (unknown) Prior Treatments: foam dressing  Pain Score:  Pt with facial grimace of 10/10 pain with pulse lavage.   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 has evolved into stage 4 when assessed on 8/9 (Active)  Wound Image   10/05/19 1108  Dressing Type ABD;Barrier Film (skin prep);Gauze (Comment);Normal saline moist dressing 10/08/19 1400  Dressing Clean;Dry;Intact 10/08/19 1400  Dressing Change Frequency Twice a day 10/08/19 1400  State of Healing Non-healing 10/08/19 1400  Site / Wound Assessment Dusky;Brown;Yellow;Pink;Painful;Friable;Bleeding 10/08/19 1400  % Wound base Red or Granulating 5% 10/08/19 1400  % Wound base Yellow/Fibrinous Exudate 30% 10/08/19 1400  % Wound base Black/Eschar 55% 10/08/19 1400  % Wound base Other/Granulation Tissue (Comment) 5% 10/08/19 1400  Peri-wound Assessment Erythema (non-blanchable);Pink;Denuded;Maceration 10/08/19 1400  Wound Length (cm) 6.5 cm 10/03/19 1300  Wound Width (cm) 5.2 cm 10/03/19 1300  Wound Depth (cm) 2.3 cm 10/03/19 1300  Wound Surface Area (cm^2) 33.8 cm^2 10/03/19 1300  Wound Volume (cm^3) 77.74 cm^3 10/03/19 1300  Tunneling (cm) 7 cm at 5 o'clock 09/26/19 1500  Undermining (cm) 1.8 cm at 12 o'clock, 1.7 cm at 3 o'clock, 3.2 cm at 5 o'clock, 0.5 cm at 6 o'clock, 1.7 cm at 9 o'clock 10/03/19 1300  Margins Unattached edges (unapproximated) 10/08/19 1400  Drainage Amount Moderate 10/08/19 1400  Drainage Description Serous 10/08/19 1400  Treatment Debridement (Selective);Hydrotherapy (Pulse lavage);Packing (Saline  gauze) 10/08/19 1400   Santyl applied to necrotic tissue   Hydrotherapy Pulsed lavage therapy - wound location: sacrum Pulsed Lavage with Suction (psi): 12 psi Pulsed Lavage with Suction - Normal Saline Used: 1000 mL Pulsed Lavage Tip: Tip with splash shield Selective Debridement Selective Debridement - Location: sacrum Selective Debridement - Tools Used: Forceps;Scissors Selective Debridement - Tissue Removed: yellow/gray slough   Wound Assessment and Plan  Wound Therapy - Assess/Plan/Recommendations Wound Therapy - Clinical Statement: Continued to work on hard slough on outer ridge of wound. Tried to debride inferior portion of the wound but tissue friable with increased bleeding. 1x silver nitrate stick required to stop bleeding. Pt benefits from debridement of necrotic tissue to promote wound healing.  Wound Therapy - Functional Problem List: decreased mobility Factors Delaying/Impairing Wound Healing: Diabetes Mellitus;Incontinence;Immobility;Multiple medical problems Hydrotherapy Plan: Debridement;Dressing change;Patient/family education;Pulsatile lavage with suction Wound Therapy - Frequency: 3X / week Wound Therapy - Follow Up Recommendations: Other (comment) (LTACH) Wound Plan: see above  Wound Therapy Goals- Improve the function of patient's integumentary system by progressing the wound(s) through the phases of wound healing (inflammation - proliferation - remodeling) by: Decrease Necrotic Tissue to: 60 Decrease Necrotic Tissue - Progress: Progressing toward goal Increase Granulation Tissue to: 40 Increase Granulation Tissue - Progress: Progressing toward goal Goals/treatment plan/discharge plan were made with and agreed upon by patient/family: No, Patient unable to participate in goals/treatment/discharge plan and family unavailable Time For Goal Achievement: 7 days Wound Therapy - Potential for Goals: Poor  Goals will be updated until maximal potential achieved or  discharge criteria met.  Discharge criteria: when goals achieved, discharge from hospital, MD decision/surgical intervention, no progress towards goals, refusal/missing three consecutive treatments without notification  or medical reason.  GP  Dani Gobble. Migdalia Dk PT, DPT Acute Rehabilitation Services Pager [phone] Office [phone]   [place] 10/08/2019, 2:39 PM

## 2019-10-09 ENCOUNTER — Encounter (HOSPITAL_COMMUNITY): Payer: Self-pay

## 2019-10-09 HISTORY — PX: IR CHOLANGIOGRAM EXISTING TUBE: IMG6040

## 2019-10-09 LAB — CBC
HCT: 28.6 % — ABNORMAL LOW (ref 39.0–52.0)
Hemoglobin: 8.4 g/dL — ABNORMAL LOW (ref 13.0–17.0)
MCH: 29 pg (ref 26.0–34.0)
MCHC: 29.4 g/dL — ABNORMAL LOW (ref 30.0–36.0)
MCV: 98.6 fL (ref 80.0–100.0)
Platelets: 248 10*3/uL (ref 150–400)
RBC: 2.9 MIL/uL — ABNORMAL LOW (ref 4.22–5.81)
RDW: 21.9 % — ABNORMAL HIGH (ref 11.5–15.5)
WBC: 14.6 10*3/uL — ABNORMAL HIGH (ref 4.0–10.5)
nRBC: 0.1 % (ref 0.0–0.2)

## 2019-10-09 LAB — COMPREHENSIVE METABOLIC PANEL
ALT: 144 U/L — ABNORMAL HIGH (ref 0–44)
AST: 225 U/L — ABNORMAL HIGH (ref 15–41)
Albumin: 1.7 g/dL — ABNORMAL LOW (ref 3.5–5.0)
Alkaline Phosphatase: 1213 U/L — ABNORMAL HIGH (ref 38–126)
Anion gap: 8 (ref 5–15)
BUN: 97 mg/dL — ABNORMAL HIGH (ref 8–23)
CO2: 42 mmol/L — ABNORMAL HIGH (ref 22–32)
Calcium: 8.7 mg/dL — ABNORMAL LOW (ref 8.9–10.3)
Chloride: 110 mmol/L (ref 98–111)
Creatinine, Ser: 0.98 mg/dL (ref 0.61–1.24)
GFR calc Af Amer: >60 mL/min (ref >60)
GFR calc non Af Amer: >60 mL/min (ref >60)
Glucose, Bld: 53 mg/dL — ABNORMAL LOW (ref 70–99)
Potassium: 3.9 mmol/L (ref 3.5–5.1)
Sodium: 160 mmol/L — ABNORMAL HIGH (ref 135–145)
Total Bilirubin: 4.9 mg/dL — ABNORMAL HIGH (ref 0.3–1.2)
Total Protein: 6.3 g/dL — ABNORMAL LOW (ref 6.5–8.1)

## 2019-10-09 LAB — GLUCOSE, CAPILLARY
Glucose-Capillary: 107 mg/dL — ABNORMAL HIGH (ref 70–99)
Glucose-Capillary: 111 mg/dL — ABNORMAL HIGH (ref 70–99)
Glucose-Capillary: 129 mg/dL — ABNORMAL HIGH (ref 70–99)
Glucose-Capillary: 172 mg/dL — ABNORMAL HIGH (ref 70–99)
Glucose-Capillary: 198 mg/dL — ABNORMAL HIGH (ref 70–99)
Glucose-Capillary: 55 mg/dL — ABNORMAL LOW (ref 70–99)

## 2019-10-09 LAB — MAGNESIUM: Magnesium: 2.8 mg/dL — ABNORMAL HIGH (ref 1.7–2.4)

## 2019-10-09 LAB — PHOSPHORUS: Phosphorus: 3.4 mg/dL (ref 2.5–4.6)

## 2019-10-09 MED ORDER — ONDANSETRON HCL 4 MG/2ML IJ SOLN: 4.0000 mg | Freq: Four times a day (QID) | INTRAMUSCULAR | 0 refills | Status: AC | PRN

## 2019-10-09 MED ORDER — FREE WATER: 400.0000 mL | Status: AC

## 2019-10-09 MED ORDER — ASPIRIN 81 MG PO CHEW: 81.0000 mg | CHEWABLE_TABLET | Freq: Every day | ORAL | Status: AC

## 2019-10-09 MED ORDER — FREE WATER
400.0000 mL | Status: DC
  Administered 2019-10-09 (×5): 400 mL

## 2019-10-09 MED ORDER — COLLAGENASE 250 UNIT/GM EX OINT: TOPICAL_OINTMENT | Freq: Two times a day (BID) | CUTANEOUS | 0 refills | Status: AC

## 2019-10-09 MED ORDER — THIAMINE HCL 100 MG PO TABS: 100.0000 mg | ORAL_TABLET | Freq: Every day | ORAL | Status: AC

## 2019-10-09 MED ORDER — ORAL CARE MOUTH RINSE: 15.0000 mL | Freq: Every day | OROMUCOSAL | 0 refills | Status: AC

## 2019-10-09 MED ORDER — SILVER NITRATE-POT NITRATE 75-25 % EX MISC: 1.0000 | CUTANEOUS | 0 refills | Status: AC | PRN

## 2019-10-09 MED ORDER — INSULIN ASPART 100 UNIT/ML ~~LOC~~ SOLN: 2.0000 [IU] | SUBCUTANEOUS | 11 refills | Status: AC

## 2019-10-09 MED ORDER — HYDROCERIN EX CREA: 1.0000 "application " | TOPICAL_CREAM | Freq: Every day | CUTANEOUS | 0 refills | Status: AC

## 2019-10-09 MED ORDER — LIP MEDEX EX OINT: TOPICAL_OINTMENT | CUTANEOUS | 0 refills | Status: AC | PRN

## 2019-10-09 MED ORDER — LIDOCAINE 5 % EX PTCH: 1.0000 | MEDICATED_PATCH | CUTANEOUS | 0 refills | Status: AC

## 2019-10-09 MED ORDER — JUVEN PO PACK: 1.0000 | PACK | Freq: Two times a day (BID) | ORAL | 0 refills | Status: AC

## 2019-10-09 MED ORDER — PANTOPRAZOLE SODIUM 40 MG IV SOLR: 40.0000 mg | Freq: Two times a day (BID) | INTRAVENOUS | Status: AC

## 2019-10-09 MED ORDER — INSULIN ASPART 100 UNIT/ML ~~LOC~~ SOLN
0.0000 [IU] | SUBCUTANEOUS | 11 refills | Status: AC
Start: 2019-10-09 — End: ?

## 2019-10-09 MED ORDER — FENTANYL 75 MCG/HR TD PT72
1.0000 | MEDICATED_PATCH | TRANSDERMAL | 0 refills | Status: AC
Start: 2019-10-10 — End: ?

## 2019-10-09 MED ORDER — SODIUM CHLORIDE 0.9% FLUSH: 10.0000 mL | INTRAVENOUS | Status: AC | PRN

## 2019-10-09 MED ORDER — FLUOXETINE HCL 20 MG/5ML PO SOLN: 20.0000 mg | Freq: Every day | ORAL | 3 refills | Status: AC

## 2019-10-09 MED ORDER — POTASSIUM CHLORIDE 20 MEQ PO PACK
20.0000 meq | PACK | Freq: Once | ORAL | Status: AC
  Administered 2019-10-09: 20 meq
  Filled 2019-10-09: qty 1

## 2019-10-09 MED ORDER — CLONAZEPAM 0.5 MG PO TABS: 0.5000 mg | ORAL_TABLET | Freq: Two times a day (BID) | ORAL | 0 refills | Status: AC

## 2019-10-09 MED ORDER — CHLORHEXIDINE GLUCONATE CLOTH 2 % EX PADS: 6.0000 | MEDICATED_PAD | Freq: Every day | CUTANEOUS | Status: AC

## 2019-10-09 MED ORDER — METOPROLOL TARTRATE 5 MG/5ML IV SOLN
2.5000 mg | INTRAVENOUS | Status: AC | PRN
Start: 2019-10-09 — End: ?

## 2019-10-09 MED ORDER — GERHARDT'S BUTT CREAM: 1.0000 "application " | TOPICAL_CREAM | CUTANEOUS | Status: AC | PRN

## 2019-10-09 MED ORDER — QUETIAPINE FUMARATE 50 MG PO TABS: 50.0000 mg | ORAL_TABLET | Freq: Every day | ORAL | Status: AC

## 2019-10-09 MED ORDER — MIDODRINE HCL 5 MG PO TABS: 5.0000 mg | ORAL_TABLET | Freq: Three times a day (TID) | ORAL | Status: AC

## 2019-10-09 MED ORDER — CHLORHEXIDINE GLUCONATE 0.12% ORAL RINSE (MEDLINE KIT): 15.0000 mL | Freq: Two times a day (BID) | OROMUCOSAL | 0 refills | Status: AC

## 2019-10-09 MED ORDER — INSULIN ASPART 100 UNIT/ML ~~LOC~~ SOLN: 2.0000 [IU] | SUBCUTANEOUS | Status: DC

## 2019-10-09 MED ORDER — INSULIN DETEMIR 100 UNIT/ML ~~LOC~~ SOLN: 10.0000 [IU] | Freq: Two times a day (BID) | SUBCUTANEOUS | 11 refills | Status: AC

## 2019-10-09 MED ORDER — PIVOT 1.5 CAL PO LIQD: 1000.0000 mL | ORAL | 0 refills | Status: AC

## 2019-10-09 MED ORDER — SODIUM CHLORIDE 0.9% FLUSH: 3.0000 mL | Freq: Two times a day (BID) | INTRAVENOUS | Status: AC

## 2019-10-09 MED ORDER — ACETAMINOPHEN 160 MG/5ML PO SOLN: 650.0000 mg | Freq: Four times a day (QID) | ORAL | 0 refills | Status: AC | PRN

## 2019-10-09 MED ORDER — FENTANYL CITRATE (PF) 100 MCG/2ML IJ SOLN
50.0000 ug | INTRAMUSCULAR | 0 refills | Status: AC | PRN
Start: 2019-10-09 — End: 2019-10-16

## 2019-10-09 MED ORDER — OXYCODONE HCL 5 MG PO TABS: 5.0000 mg | ORAL_TABLET | ORAL | 0 refills | Status: AC | PRN

## 2019-10-09 MED ORDER — TRAZODONE HCL 50 MG PO TABS: 50.0000 mg | ORAL_TABLET | Freq: Every evening | ORAL | Status: AC | PRN

## 2019-10-09 MED ORDER — SODIUM CHLORIDE 3 % IN NEBU: 4.0000 mL | INHALATION_SOLUTION | Freq: Two times a day (BID) | RESPIRATORY_TRACT | 12 refills | Status: AC

## 2019-10-09 MED ORDER — DEXTROSE 50 % IV SOLN: 0.0000 mL | INTRAVENOUS | Status: AC | PRN

## 2019-10-09 MED ORDER — TESTOSTERONE 50 MG/5GM (1%) TD GEL: 5.0000 g | Freq: Every day | TRANSDERMAL | Status: AC

## 2019-10-09 MED ORDER — IPRATROPIUM-ALBUTEROL 0.5-2.5 (3) MG/3ML IN SOLN: 3.0000 mL | Freq: Three times a day (TID) | RESPIRATORY_TRACT | Status: AC

## 2019-10-09 NOTE — Progress Notes (Signed)
  Speech Language Pathology Treatment: [place] Speaking valve  Patient Details Name: Todd [name] MRN: [id number] DOB: 08-03-1955 Today's Date: 10/09/2019 Time: 4580-9983 SLP Time Calculation (min) (ACUTE ONLY): 11 min  Assessment / Plan / Recommendation Clinical Impression  Pt was seen in conjunction with RT for inline PMV placement. Pt on PSV upon arrival and producing phonation around his trach despite inflation of cuff. With assistance from RT, cuff was slowly deflated and settings were adjusted to settings previously used with PMV (PS 14, PEEP 0). Pt quickly showed signs of intolerance with desaturation into the 70s. PMV was removed and pt was returned to his original vent settings, at which time his saturations returned to baseline quickly. Pt continued to be able to phonate around the trach, suggestive of a leak. MD also present and with plans to exchange trach, considering need for increased size as well. SLP will f/u for potential to use inline PMV after exchange is done to assess for ongoing tolerance.    HPI HPI: Pt admitted on 6/17 with CHF exacerbation, found to have multi-vessel disease requiring CABG 6/24, post op VT Arrest 6/25 and cannulated for VA ECMO. S/p decannulation and impella. Pt has had a prolonged hospitalization with complications including intubation from 6/24 from CABG, Until 7/12 when trach placed, sepsis from fungal infection, tracehobronchitis, PEA arrest on 8/14 but f/c next day, awake and alert.       SLP Plan  Continue with current plan of care       Recommendations  Diet recommendations: NPO      Patient may use Passy-Muir Speech Valve: with SLP only (and RT) PMSV Supervision: Full         Oral Care Recommendations: Oral care QID Follow up Recommendations: LTACH SLP Visit Diagnosis: Aphonia (R49.1) Plan: Continue with current plan of care       GO                Mahala Menghini., M.A. CCC-SLP Acute Rehabilitation Services Pager  [phone] Office [phone]  10/09/2019, 9:15 AM

## 2019-10-09 NOTE — TOC Progression Note (Signed)
Transition of Care [facility]) - Progression Note    Patient Details  Name: SEYDOU HEARNS MRN: [id number] Date of Birth: Sep 23, 1955  Transition of Care [facility]) CM/SW Contact  Terrial Rhodes, LCSWA Phone Number: 10/09/2019, 11:07 AM  Clinical Narrative:     CSW received confirmation this morning from [place] that patient has been approved for [facility]. CSW called patients spouse Synetta Fail to let her know of approval. CSW let patients spouse know that patient will transfer over to [facility] today. CSW and Case manager will continue to follow.   Expected Discharge Plan: Long Term Acute Care (LTAC) Barriers to Discharge: Insurance Authorization  Expected Discharge Plan and Services Expected Discharge Plan: Long Term Acute Care (LTAC)   Discharge Planning Services: CM Consult Post Acute Care Choice: Long Term Acute Care (LTAC) Living arrangements for the past 2 months: Single Family Home                                       Social Determinants of Health (SDOH) Interventions    Readmission Risk Interventions Readmission Risk Prevention Plan 09/03/2019  Transportation Screening Complete  PCP or Specialist Appt within 3-5 Days Complete  HRI or Home Care Consult Complete  Social Work Consult for Recovery Care Planning/Counseling Complete  Palliative Care Screening Complete  Medication Review Oceanographer) Complete  Some recent data might be hidden

## 2019-10-09 NOTE — Progress Notes (Signed)
Dr. Katrinka Blazing requesting to change pt trach to a new flexible 6 cuffed shiley and would like a flexible 8 shiley as well at bedside. Order was placed for trachs early this am. Per secretary unable to obtain trachs per SPD they are not in stock at this time. Dr Katrinka Blazing notified and said to leave his trach as is at this time. RT will continue to monitor.

## 2019-10-09 NOTE — Progress Notes (Signed)
Physical Therapy Treatment Patient Details Name: Todd [name] MRN: [id number] DOB: July 24, 1955 Today's Date: 10/09/2019    History of Present Illness Todd [name] is a 64 y.o. male presenting with shortness of breath; noted AKI, CHF, recent PNA, elevated troponin, pulm edema/effusion, and cardiomegaly.Marland Kitchen CABG 6/24 and intubated (extubated 6/25)--went into cardiac arrest on same date with re-opening of chest for heart massage and direct epicardial paddles and place on ECMO. 08/03/19 washout out for tamponade. 7/2 sternal closure and wound vac. 7/9 Impella removed.08/20/19 Tracheostomy 08/24/19 cholecystostomy tube. 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 PMHx: HFrEF, COPD, HTN, T2DM, HLD, MDD, chronic back pain, vitamin deficiency, BPH, tobacco use disorder, marijuana use disorder    PT Comments    Pt awake, alert and talking more this session. Pt with continued generalized atrophy with increased tightness in bil LE and UE but more willing to move bil UE with AAROM this session than previously. Pt tilted to 35 degrees for 2 min, 45 degrees for 5 min, 50 degrees for 2 min then back to 30 degrees 2 min. Pt limited by back pain and needs constant verbal and tactile cues to contract quads in standing. End of session pt stating he wanted something to eat and instructed he couldn't on the vent and he said "just turn the machine off". Pt would benefit from being outside if able to wean off vent. Will continue to follow.   106/54 (71) supine Tile 50degree 98/42 (53) Return to 30 degree tilt 106/52 (66) HR 82-84 CPAP with Fio2 40%, SpO2 with desaturation to 84% in supine and 95% in standing    Follow Up Recommendations  LTACH;SNF     Equipment Recommendations  Other (comment) (defer to next venue)    Recommendations for Other Services       Precautions / Restrictions Precautions Precautions: Fall;Sternal;Other (comment) Precaution Comments: trach, vent, sacral  wound, PEG    Mobility  Bed Mobility Overal bed mobility: Needs Assistance Bed Mobility: Rolling Rolling: Mod assist         General bed mobility comments: total assist to slide toward HOB, mod assist to roll bil for pad change. Supine to and from stand in tilt. Pt positioned in partial chair with right hip roll end of session  Transfers                    Ambulation/Gait                 Stairs             Wheelchair Mobility    Modified Rankin (Stroke Patients Only)       Balance Overall balance assessment: Needs assistance     Sitting balance - Comments: deferred, used KREG bed today                                    Cognition Arousal/Alertness: Awake/alert Behavior During Therapy: Flat affect Overall Cognitive Status: Impaired/Different from baseline Area of Impairment: Orientation;Memory;Following commands                 Orientation Level: Place;Time Current Attention Level: Sustained Memory: Decreased short-term memory Following Commands: Follows one step commands with increased time Safety/Judgement: Decreased awareness of safety;Decreased awareness of deficits   Problem Solving: Slow processing General Comments: pt reporting his back hurts and asking where his brother is. pt talking around trach at  times but requires prompting to respond to questions      Exercises General Exercises - Upper Extremity Shoulder Flexion: AAROM;Both;10 reps;Standing Elbow Flexion: AAROM;10 reps;Standing;Both General Exercises - Lower Extremity Quad Sets: AROM;Both;10 reps;Standing    General Comments        Pertinent Vitals/Pain Pain Score: 4  Pain Location: back in standing Pain Descriptors / Indicators: Aching;Guarding Pain Intervention(s): Limited activity within patient's tolerance;Monitored during session;Repositioned    Home Living                      Prior Function            PT Goals (current  goals can now be found in the care plan section) Progress towards PT goals: Progressing toward goals    Frequency    Min 2X/week      PT Plan Current plan remains appropriate    Co-evaluation              AM-PAC PT "6 Clicks" Mobility   Outcome Measure  Help needed turning from your back to your side while in a flat bed without using bedrails?: Total Help needed moving from lying on your back to sitting on the side of a flat bed without using bedrails?: Total Help needed moving to and from a bed to a chair (including a wheelchair)?: Total Help needed standing up from a chair using your arms (e.g., wheelchair or bedside chair)?: Total Help needed to walk in hospital room?: Total Help needed climbing 3-5 steps with a railing? : Total 6 Click Score: 6    End of Session   Activity Tolerance: Patient tolerated treatment well Patient left: in bed;with call bell/phone within reach Nurse Communication: Mobility status;Need for lift equipment (tilting each shift) PT Visit Diagnosis: Unsteadiness on feet (R26.81);Muscle weakness (generalized) (M62.81);Difficulty in walking, not elsewhere classified (R26.2)     Time: 4098-1191 PT Time Calculation (min) (ACUTE ONLY): 34 min  Charges:  $Therapeutic Activity: 23-37 mins                     Delenn Ahn P, PT Acute Rehabilitation Services Pager: [phone] Office: [phone]    Katori Wirsing B Wladyslaw Henrichs 10/09/2019, 1:55 PM

## 2019-10-09 NOTE — Progress Notes (Signed)
NAME:  Todd [name], MRN:  [id number], DOB:  1955-09-29, LOS: 16 ADMISSION DATE:  07/26/2019, CONSULTATION DATE:  08/03/2019 REFERRING MD:  Roxan Hockey, CHIEF COMPLAINT:  ECMO    Brief History   64 year old male with history of ischemic cardiomyopathy admitted with CHF exacerbation, found to have multi-vessel disease requiring CABG 6/24, post op VT arrest 6/25 and cannulated for VA ECMO. S/p decannulation and impella. Hospital course has been complicated by cholecystitis, candidemia, and chronic hypoxic respiratory failure requiring tracheostomy.   Past Medical History   Past Medical History:  Diagnosis Date  . Arthritis   . CHF (congestive heart failure) ([place])   . Depression   . Diabetes mellitus without complication ([place])   . Hard of hearing   . Hyperlipidemia   . Hypertension    Significant Hospital Events   6/24 CABG 6/25 VA cannulation 6/28 decannulated and impella placed 6/29 bedside re-exploration; s/p decannulation. Placement of impella device originally at p8 but decreased to p4 2/2 suction events overnight up to p6. Echo completed and repositioned device. Remained on considerable support with 8 epi and 46 norepi vaso 0.05. continued chest tube output with large clots noted. Heparin thru device. Replacement products ongoing. 60% 8 peep 6/30 bedside mediastinal re-exploration and clean out of hematoma with tamponade and worsening hemodynamics. Pt had large volume transfusion. rebolused with amio 2/2 nsvt episode.  Weight up 48 pounds.  Started diuresis, Lasix drip started. 7/01 iatrogenic respiratory alkalosis, vent rate decreased. 7/02 No significant issues overnight remains on pressors and Impella not tolerating tube feeds with high gastric output awaiting core track placement 7/03 started trickle TF  7/05 Swab removed, CVL and PICC placed. 7/13 Trach and BAL 7/16 Awake and tracking. No follow commands. Still having fevers. Successful ultrasound and CT guided placement of a  10.2 French cholecystostomy tube. A small amount of aspirated bile was capped and sent to the laboratory for analysis 7/17 Off sedation, PSV.  Beginning to follow some commands occasionally intermittently.  Very jaundiced.  Growing Candida in the blood and BAL > diflucan.   7/21 Ongoing fevers, WBC increased 7/22 Persistent fevers, pan cultured with CT Chest/Head. Suspected source tracheobronchitis.  7/28 Transitioned to eliquis, jaundiced but labs improving  7/29 Agitation/restless.  Trazodone added QHS. Weaning midodrine, attempting diuresis  7/30 Increased Na, weaning on PSV.  8/01 On 40% ATC, significantly improved mental status 8/1>> ongoing attempts at trach weaning complicated by weakness, secretion management, and anxiety 8/8 started back on meropenem and milrinone 8/12 patient making slow improvements, he has been able to tolerate short course ATC 8/13 Weaned off milrinone 8/14 Code blue called for hypoxemia leading to PEA arrest. ROSC achieved after 6 min. 8/15: In  morning patient is unfazed from his recent events and continues asking for water to drink. Awake, alert and follows commands. 8/16 seen by surg per wound care recs-->"Do not recommend any acute surgical intervention. Will ultimately defer wound care recommendations to the wound care team, but I think it is reasonable to decrease hydrotherapy to 3x per week. I would increase dressing changes to BID and continue santyl for enzymatic debridement". 8/17: Using albumin and Lasix to try to mobilize anasarca 8/18: Tracheostomy downsized to 7.5.  Attempting short trials of aerosol trach collar 8/19-8/20 Now in a negative fluid balance.  Sodium continuing to rise.  Holding diuretics continuing to work on physical therapy and reconditioning, got a unit of blood in am for hgb 7.3. F/U hgb was 6.8, had scant amt  of bloody tracheal out-put but not enough to account for hgb loss. No frank blood in stools. Got 2 more units of PRBCs. Hgb now  9.7. w/out obvious source. Complaining of more pain than last few days. More restless. Stopped arixtra again. Added fent patch for pain needs.  8/30 GB drain with bloody output > malpositioned; therefore, removed by IR.  Consults:  Advanced Heart Failure PCCM TCTS  Procedures:  LUE PICC 7/5 >>7/21 R Brachial ALine 7/9 >>7/27 Chest Tubes >> out L IJ CVC 7/21 >>7/27 Peg tube 7/27 >> Tracheostomy downsized to 7.5 on 8/18  Significant Diagnostic Tests:  6/22 spirometry with restrictive physiology, preserved FEV1/FVC 6/18 echo: LVEF 10-31%, grade I diastolic dysfunction 5/94 US Abdomen cholelithiasis without cholecystitis. Mild wall thinking in setting of liver disease and ascites.  7/10 LE [place] negative for DVT  7/20 CT ABD w contrast >> stable position of the percutaneous cholecystostomy tube with complete decompression of the gallbladder, trace residual free fluid within the RUQ, decreased since prior, trace bilateral pleural effusions L>R, scattered areas of ground glass airspace disease within the RML, RLL 7/22 CT Chest w/o Contrast >> Retrosternal fluid collection, non specific.  Bilateral pleural effusions, portions loculated.   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 Stable bilateral mastoid and middle ear effusions. Right upper quadrant ultrasound 8/27 >> per cholecystostomy tube in place with some cholelithiasis and sludge, no dilation or wall thickening, cirrhosis  Micro Data:  7/3 RQ> Few candida tropicalis, likely contaminant 7/12 BAL Cx> Candida tropicalis and albicans, likely contaminant 7/14 BCx >> candida parapsilosis 7/17 BCx >> neg resp 8/7 >> stenotrophomonas resp 8/26> stenotrophomonas  Antimicrobials:  Meropenem 8/8 >>8/10 Diflucan  7/16 >> 8/9 vanc 7/22 >>8/10 levaquin 8/10 >> 8/23 levaquin 8/27 >> 8/30  Interim history/subjective:  GB drain removed by IR. No additional  events.  Objective   Blood pressure 128/61, pulse 77, temperature 98.5 F (36.9 C), temperature source Axillary, resp. rate (!) 24, height 5' 9" (1.753 m), weight 73.1 kg, SpO2 98 %.    Vent Mode: PRVC FiO2 (%):  [40 %-50 %] 40 % Set Rate:  [20 bmp] 20 bmp Vt Set:  [420 mL] 420 mL PEEP:  [5 cmH20] 5 cmH20 Pressure Support:  [10 cmH20] 10 cmH20 Plateau Pressure:  [14 cmH20-17 cmH20] 14 cmH20   Intake/Output Summary (Last 24 hours) at 10/09/2019 0743 Last data filed at 10/09/2019 0100 Gross per 24 hour  Intake 1690 ml  Output 2950 ml  Net -1260 ml   Filed Weights   10/06/19 0500 10/07/19 0500 10/08/19 0358  Weight: 76.3 kg 71.6 kg 73.1 kg    Examination: General: Ill-appearing elderly man, resting comfortably HENT: Temporal wasting Neck: Trach in good position, clean dry Lungs: Diffuse bilateral rhonchi Cardiovascular: Regular, distant Abdomen: Nondistended, mild diffuse tenderness, GB drain removed and site clean with minimal blood / drainage. Extremities: 1+ edema, ecchymosis on bilateral arms Neuro: Nods to questions, tries to mouth words, globally weak, moves upper extremities  Resolved Hospital Problem list   Suspected HCAP Acute kidney injury Hypernatremia Acute Metabolic Encephalopathy  -CTH without acute intracranial pathology. EEG negative 7/23.  Shock Heparin-induced thrombocytopenia >> resolved, required bivalirudin 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 -Opthalmology evaluated , diflucan x 3 weeks PEA arrest 8/14 preceded by hypoxemia Assessment & Plan:   Acute hypoxic respiratory failure requiring mechanical ventilation with severe weakness/deconditioning Tracheostomy dependence; sedation minimized over the last 48 hours ? stenotrophomonas  PNA vs colonization - s/p course levaquin. - Continue PSV as he can tolerate with ATC as able - Trach order set as per protocol - VAP prevention orders - Chest PT  Cardiogenic/hemorrhagic shock s/p CABG  complicated by VT arrest. Shock resolved. S/p VA ECMO (decannulated 6/28), s/p impella placement (6/28>7/9) Atrial flutter, reverted to NSR - Continue supportive care.  Hyperbilirubinemia 2/2 acalculous cholecystitis vs drug induced cholestasis (?amio) - S/p percutaneous GB drain by IR on 7/17 with removal 8/30 due to malpositioning.  Alk phos continues to climb, ? Bone breakdown. - Follow LFT's. - Monitor for signs of recurrent cholecystitis and if so, re-engage IR for perc chole replacement.  Diabetes mellitus II highly sensitive to insulin. Single episode hypoglycemia early AM 8/31. - Levemir and sliding scale insulin as per protocol (tube feed coverage lowered from 4u to 2u).  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 - Follow BMP - Continue free water but change from q4h to q2h. - Might need to add D5W.  Large sacral decub. - Wound care.  Best practice:   Diet: tube feeds Pain/Anxiety/Delirium protocol (if indicated): per protocol VAP protocol (if indicated): per protocol DVT prophylaxis: SCDs GI prophylaxis: PPI BID Glucose control: SSI + levemir Mobility: PT/OT as able  Code Status: FULL Family Communication: per primary Disposition: ICU for vent management and weaning - awaiting LTACH placement  CC time: 35 min.   Montey Hora, [facility] 10/09/2019, 7:43 AM

## 2019-10-09 NOTE — TOC Transition Note (Addendum)
Transition of Care [facility]) - CM/SW Discharge Note   Patient Details  Name: Todd [name] MRN: [id number] Date of Birth: May 22, 1955  Transition of Care [facility]) CM/SW Contact:  [name], RN Phone Number: 10/09/2019, 11:02 AM   Clinical Narrative:    Case management received confirmation from [facility], [facility]askel Schroeder, [facility] that the patient was approved for LTAC placement through insurance.  Haskel Schroeder, RNCM will reach out to the patient's wife to notify her of this transfer today.  [facility] will have an available bed for the patient today and the patient will be able to transfer to the facility via [facility] later on today once a bed at the facility is confirmed.  Will continue to follow for patient's transfer.  8/31- 1207-  [facility] confirmed admission and bed availability for the patient today.  The accepting physician is Dr. Nelson Chimes and the RN can call report to [facility] to [phone] and patient is to transfer to room 321.  [facility] is ready for the patient and [facility] can be called for transport once the patient is ready - [phone].   Final next level of care: Long Term Acute Care (LTAC) Barriers to Discharge: Insurance Authorization   Patient Goals and CMS Choice Patient states their goals for this hospitalization and ongoing recovery are:: Patient's wife would like patient to transfer to [facility] CMS Medicare.gov Compare Post Acute Care list provided to:: Patient Represenative (must comment) (Patient's spouse - [name]) Choice offered to / list presented to : Spouse  Discharge Placement                       Discharge Plan and Services   Discharge Planning Services: CM Consult Post Acute Care Choice: Long Term Acute Care (LTAC)                               Social Determinants of Health (SDOH) Interventions     Readmission Risk Interventions Readmission Risk Prevention Plan 09/03/2019  Transportation Screening Complete   PCP or Specialist Appt within 3-5 Days Complete  HRI or Home Care Consult Complete  Social Work Consult for Recovery Care Planning/Counseling Complete  Palliative Care Screening Complete  Medication Review Oceanographer) Complete  Some recent data might be hidden

## 2019-10-09 NOTE — Progress Notes (Signed)
Occupational Therapy Treatment Patient Details Name: Todd [name] MRN: [id number] DOB: 1955-03-21 Today's Date: 10/09/2019    History of present illness Todd [name] is a 64 y.o. male presenting with shortness of breath; noted AKI, CHF, recent PNA, elevated troponin, pulm edema/effusion, and cardiomegaly.Marland Kitchen CABG 6/24 and intubated (extubated 6/25)--went into cardiac arrest on same date with re-opening of chest for heart massage and direct epicardial paddles and place on ECMO. 08/03/19 washout out for tamponade. 7/2 sternal closure and wound vac. 7/9 Impella removed.08/20/19 Tracheostomy 08/24/19 cholecystostomy tube. 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 PMHx: HFrEF, COPD, HTN, T2DM, HLD, MDD, chronic back pain, vitamin deficiency, BPH, tobacco use disorder, marijuana use disorder   OT comments  Pt less engaging this session than previous OT session. Pt awake and intermittently follow commands but not consistent. Attempted hand over hand for basic ADL however pt with no attempt to carryover task on his own. He does use each UE to perform somewhat purposeful movement (reaching for head with RUE, scratching chin with LUE). VSS throughout on trach/vent (40% FiO2, PEEP 5). Pt engaging in bil UE P/AAROM given max multimodal cues to do so. Per RN pt for d/c to [facility] today. Will continue to follow while he remains in acute setting.    Follow Up Recommendations  LTACH;Supervision/Assistance - 24 hour    Equipment Recommendations  Other (comment) (defer)          Precautions / Restrictions Precautions Precautions: Fall;Sternal;Other (comment) Precaution Comments: trach, vent, sacral wound, PEG Restrictions Weight Bearing Restrictions: No       Mobility Bed Mobility Overal bed mobility: Needs Assistance Bed Mobility: Rolling Rolling: Mod assist         General bed mobility comments: totalA to reposition as pt with tendency to list left and leaning into L  bedrail   Transfers                      Balance Overall balance assessment: Needs assistance     Sitting balance - Comments: deferred, used KREG bed today                                   ADL either performed or assessed with clinical judgement   ADL Overall ADL's : Needs assistance/impaired                                       General ADL Comments: attempted hand over hand basic ADL tasks but pt not engaging or attempting to carry over task                        Cognition Arousal/Alertness: Awake/alert Behavior During Therapy: Flat affect Overall Cognitive Status: Impaired/Different from baseline Area of Impairment: Orientation;Memory;Following commands                 Orientation Level: Place;Time Current Attention Level: Sustained Memory: Decreased short-term memory Following Commands: Follows one step commands with increased time;Follows one step commands inconsistently Safety/Judgement: Decreased awareness of safety;Decreased awareness of deficits   Problem Solving: Slow processing General Comments: pt less engaging today than previous OT sessions`        Exercises General Exercises - Upper Extremity Shoulder Flexion: AAROM;Both;10 reps;Standing Shoulder Horizontal ABduction: AAROM;Both;5 reps Elbow Flexion: AAROM;Both;5 reps  Elbow Extension: AAROM;Both;5 reps General Exercises - Lower Extremity Quad Sets: AROM;Both;10 reps;Standing Hand Exercises Forearm Supination: PROM;AAROM;Left;5 reps Forearm Pronation: PROM;AAROM;Left;5 reps   Shoulder Instructions       General Comments      Pertinent Vitals/ Pain       Pain Assessment: Faces Pain Score: 4  Faces Pain Scale: Hurts even more Pain Location: discomfort, grimacing with repositioning in bed  Pain Descriptors / Indicators: Aching;Guarding;Grimacing Pain Intervention(s): Repositioned;Monitored during session  Home Living                                           Prior Functioning/Environment              Frequency  Min 2X/week        Progress Toward Goals  OT Goals(current goals can now be found in the care plan section)  Progress towards OT goals: Progressing toward goals  Acute Rehab OT Goals Patient Stated Goal: pt unable to state OT Goal Formulation: Patient unable to participate in goal setting Time For Goal Achievement: 10/23/19 Potential to Achieve Goals: Fair  Plan Discharge plan remains appropriate    Co-evaluation                 AM-PAC OT "6 Clicks" Daily Activity     Outcome Measure   Help from another person eating meals?: Total Help from another person taking care of personal grooming?: A Lot Help from another person toileting, which includes using toliet, bedpan, or urinal?: Total Help from another person bathing (including washing, rinsing, drying)?: Total Help from another person to put on and taking off regular upper body clothing?: Total Help from another person to put on and taking off regular lower body clothing?: Total 6 Click Score: 7    End of Session Equipment Utilized During Treatment: Oxygen  OT Visit Diagnosis: Other abnormalities of gait and mobility (R26.89);Muscle weakness (generalized) (M62.81);Other symptoms and signs involving cognitive function;Pain Pain - Right/Left: Left Pain - part of body: Shoulder;Arm   Activity Tolerance Patient tolerated treatment well   Patient Left in bed;with call bell/phone within reach   Nurse Communication Mobility status        Time: 1610-9604 OT Time Calculation (min): 19 min  Charges: OT General Charges $OT Visit: 1 Visit OT Treatments $Self Care/Home Management : 8-22 mins   Marcy Siren, OT Acute Rehabilitation Services Pager [phone] Office [phone]    [name] 10/09/2019, 3:38 PM

## 2019-10-09 NOTE — Progress Notes (Signed)
[facility] DAILY ICU PROGRESS NOTE                   [address]            [place] [address]          [phone]   53 Days Post-Op Procedure(s) (LRB): REMOVAL OF IMPELLA LEFT VENTRICULAR ASSIST DEVICE (N/A) TRANSESOPHAGEAL ECHOCARDIOGRAM (TEE) (N/A)  Total Length of Stay:  LOS: 75 days   Subjective: Trying to communicate this morning by mouthing words. Not in pain. Nods in understanding.   Objective: Vital signs in last 24 hours: Temp:  [98.5 F (36.9 C)-98.7 F (37.1 C)] 98.7 F (37.1 C) (08/31 0745) Pulse Rate:  [25-102] 77 (08/31 0739) Cardiac Rhythm: Normal sinus rhythm (08/31 0400) Resp:  [15-27] 24 (08/31 0739) BP: (73-142)/(53-95) 128/61 (08/31 0739) SpO2:  [87 %-100 %] 100 % (08/31 0757) FiO2 (%):  [40 %-50 %] 40 % (08/31 0757)  Filed Weights   10/06/19 0500 10/07/19 0500 10/08/19 0358  Weight: 76.3 kg 71.6 kg 73.1 kg    Weight change:       Intake/Output from previous day: 08/30 0701 - 08/31 0700 In: 1690 [I.V.:10; NG/GT:1380] Out: 2950 [Urine:2250; Drains:500; Stool:200]  Intake/Output this shift: No intake/output data recorded.  Current Meds: Scheduled Meds: . sodium chloride   Intravenous Once  . sodium chloride   Intravenous Once  . sodium chloride   Intravenous Once  . aspirin  81 mg Per Tube Daily  . chlorhexidine gluconate (MEDLINE KIT)  15 mL Mouth Rinse BID  . Chlorhexidine Gluconate Cloth  6 each Topical Daily  . clonazePAM  0.5 mg Per Tube BID  . collagenase   Topical BID  . fentaNYL  1 patch Transdermal Q72H  . FLUoxetine  20 mg Per Tube Daily  . free water  400 mL Per Tube Q2H  . hydrocerin   Topical Daily  . insulin aspart  0-9 Units Subcutaneous Q4H  . insulin aspart  2 Units Subcutaneous Q4H  . insulin detemir  10 Units Subcutaneous BID  . ipratropium-albuterol  3 mL Nebulization TID  . lidocaine  1 patch Transdermal Q24H  . mouth rinse  15 mL Mouth Rinse 10 times per day  . midodrine  5 mg Per Tube TID WC  .  nutrition supplement (JUVEN)  1 packet Per Tube BID BM  . pantoprazole (PROTONIX) IV  40 mg Intravenous Q12H  . potassium chloride  20 mEq Per Tube Once  . QUEtiapine  50 mg Oral QHS  . sodium chloride flush  10-40 mL Intracatheter Q12H  . sodium chloride flush  3 mL Intravenous Q12H  . sodium chloride flush  5 mL Intracatheter Q8H  . sodium chloride HYPERTONIC  4 mL Nebulization BID  . testosterone  5 g Transdermal Daily  . thiamine  100 mg Per Tube Daily   Continuous Infusions: . feeding supplement (PIVOT 1.5 CAL) 1,000 mL (10/09/19 0100)   PRN Meds:.acetaminophen (TYLENOL) oral liquid 160 mg/5 mL, dextrose, fentaNYL (SUBLIMAZE) injection, Gerhardt's butt cream, lip balm, metoprolol tartrate, ondansetron (ZOFRAN) IV, oxyCODONE, silver nitrate applicators, sodium chloride flush, traZODone  General appearance: alert, cooperative and no distress Heart: regular rate and rhythm, S1, S2 normal, no murmur, click, rub or gallop Lungs: rhonchi bilaterally Abdomen: soft, non-tendero Extremities: + edema in bilateral lower ext. R > L Wound: clean and dry  Lab Results: CBC: Recent Labs    10/08/19 1808 10/09/19 0530  WBC 15.7* 14.6*  HGB  8.3* 8.4*  HCT 29.1* 28.6*  PLT 247 248   BMET:  Recent Labs    10/08/19 0402 10/09/19 0530  NA 158* 160*  K 3.9 3.9  CL 109 110  CO2 40* 42*  GLUCOSE 278* 53*  BUN 117* 97*  CREATININE 1.10 0.98  CALCIUM 8.3* 8.7*    CMET: Lab Results  Component Value Date   WBC 14.6 (H) 10/09/2019   HGB 8.4 (L) 10/09/2019   HCT 28.6 (L) 10/09/2019   PLT 248 10/09/2019   GLUCOSE 53 (L) 10/09/2019   CHOL 203 (H) 07/27/2019   TRIG 213 (H) 08/27/2019   HDL 34 (L) 07/27/2019   LDLCALC 148 (H) 07/27/2019   ALT 144 (H) 10/09/2019   AST 225 (H) 10/09/2019   NA 160 (H) 10/09/2019   K 3.9 10/09/2019   CL 110 10/09/2019   CREATININE 0.98 10/09/2019   BUN 97 (H) 10/09/2019   CO2 42 (H) 10/09/2019   TSH 2.243 07/31/2019   INR 1.3 (H) 09/27/2019    HGBA1C 5.3 09/25/2019      PT/INR: No results for input(s): LABPROT, INR in the last 72 hours. Radiology: IR Catheter Tube Change  Result Date: 10/08/2019 INDICATION: Bloody output from the percutaneous cholecystostomy EXAM: FLUOROSCOPIC INJECTION OF THE PERCUTANEOUS CHOLECYSTOSTOMY MEDICATIONS: The patient is currently admitted to the hospital and receiving intravenous antibiotics. The antibiotics were administered within an appropriate time frame prior to the initiation of the procedure. ANESTHESIA/SEDATION: Moderate Sedation Time:  NONE. The patient was continuously monitored during the procedure by the interventional radiology nurse under my direct supervision. COMPLICATIONS: None immediate. PROCEDURE: Under sterile conditions, the existing cholecystostomy was injected with contrast. Fluoroscopic imaging performed. Catheter has retracted into the peritoneal cavity and is within the perihepatic ascites. IMPRESSION: Cholecystostomy has retracted into the perihepatic ascites. Catheter cut and removed. These results were called by telephone at the time of interpretation on 10/08/2019 at 4:11 pm to provider Erskine Emery, MD, Who verbally acknowledged these results. Electronically Signed   By: Jerilynn Mages.  Shick M.D.   On: 10/08/2019 16:14     Assessment/Plan: S/P Procedure(s) (LRB): REMOVAL OF IMPELLA LEFT VENTRICULAR ASSIST DEVICE (N/A) TRANSESOPHAGEAL ECHOCARDIOGRAM (TEE) (N/A)  1. CV-EKG this morning shows prolonged QT and NSR in the 70s. BP has been well controlled. Off Amio.  2. Pulm- Trach dependent/ CPAP trials, management per CCM, CXR yesterday shows bilateral lung opacities which are concerning for pneumonia. Respiratory cultures on 8/26 grew stenotrophomonas and he was treated with Levaquin which ended yesterday.  3. Renal- creatinine 0.98, Hypernatremia continues, free water increased today to q2 hours. 4. Endo-Blood glucose mostly controlled but low this morning.  5. GI-Hyperbilirubinemia, GB tube  was removed by IR due to retraction. Alk phos remains elevated. Monitoring for signs of recurrent cholecystitis  6. ID-No recent fevers, Levaquin ended 8/30 7. Sacral decub. Wound- continue pulse lavage 8. Continue work with SLP/RT/PT/OT    Elgie Collard 10/09/2019 9:54 AM

## 2019-10-09 NOTE — Progress Notes (Signed)
Assisted speech therapy with placement of inlin PMV. Pt placed into previously used CPAP/PS settings of 0/14 and 40%. Cuff was slowly deflated. Pt tolerated well. Shortly after pt with increased agitation and desaturation to 74%. Pt placed back on full support setting, 100% FiO2 and recovered to 98%. After pt recovered RT was able to place pt back on CPAP/PS 10/5 40%. RT will continue to monitor. MD aware of inline PMV trial.

## 2019-10-10 DIAGNOSIS — I5022 Chronic systolic (congestive) heart failure: Secondary | ICD-10-CM

## 2019-10-10 DIAGNOSIS — I469 Cardiac arrest, cause unspecified: Secondary | ICD-10-CM

## 2019-10-10 DIAGNOSIS — J9621 Acute and chronic respiratory failure with hypoxia: Secondary | ICD-10-CM

## 2019-10-10 DIAGNOSIS — R6520 Severe sepsis without septic shock: Secondary | ICD-10-CM

## 2019-10-11 DIAGNOSIS — I469 Cardiac arrest, cause unspecified: Secondary | ICD-10-CM

## 2019-10-11 DIAGNOSIS — J9621 Acute and chronic respiratory failure with hypoxia: Secondary | ICD-10-CM

## 2019-10-11 DIAGNOSIS — I5022 Chronic systolic (congestive) heart failure: Secondary | ICD-10-CM

## 2019-10-11 DIAGNOSIS — R6520 Severe sepsis without septic shock: Secondary | ICD-10-CM

## 2019-10-12 DIAGNOSIS — R6520 Severe sepsis without septic shock: Secondary | ICD-10-CM

## 2019-10-12 DIAGNOSIS — I469 Cardiac arrest, cause unspecified: Secondary | ICD-10-CM

## 2019-10-12 DIAGNOSIS — J9621 Acute and chronic respiratory failure with hypoxia: Secondary | ICD-10-CM

## 2019-10-12 DIAGNOSIS — I5022 Chronic systolic (congestive) heart failure: Secondary | ICD-10-CM

## 2019-10-13 DIAGNOSIS — I469 Cardiac arrest, cause unspecified: Secondary | ICD-10-CM

## 2019-10-13 DIAGNOSIS — R6520 Severe sepsis without septic shock: Secondary | ICD-10-CM

## 2019-10-13 DIAGNOSIS — I5022 Chronic systolic (congestive) heart failure: Secondary | ICD-10-CM

## 2019-10-13 DIAGNOSIS — J9621 Acute and chronic respiratory failure with hypoxia: Secondary | ICD-10-CM

## 2019-10-14 DIAGNOSIS — I5022 Chronic systolic (congestive) heart failure: Secondary | ICD-10-CM

## 2019-10-14 DIAGNOSIS — R6520 Severe sepsis without septic shock: Secondary | ICD-10-CM

## 2019-10-14 DIAGNOSIS — J9621 Acute and chronic respiratory failure with hypoxia: Secondary | ICD-10-CM

## 2019-10-14 DIAGNOSIS — I469 Cardiac arrest, cause unspecified: Secondary | ICD-10-CM

## 2019-10-22 DIAGNOSIS — J9621 Acute and chronic respiratory failure with hypoxia: Secondary | ICD-10-CM

## 2019-10-22 DIAGNOSIS — I469 Cardiac arrest, cause unspecified: Secondary | ICD-10-CM

## 2019-10-22 DIAGNOSIS — R6520 Severe sepsis without septic shock: Secondary | ICD-10-CM

## 2019-10-22 DIAGNOSIS — I5022 Chronic systolic (congestive) heart failure: Secondary | ICD-10-CM

## 2019-10-23 DIAGNOSIS — I5022 Chronic systolic (congestive) heart failure: Secondary | ICD-10-CM

## 2019-10-23 DIAGNOSIS — R6520 Severe sepsis without septic shock: Secondary | ICD-10-CM

## 2019-10-23 DIAGNOSIS — I469 Cardiac arrest, cause unspecified: Secondary | ICD-10-CM

## 2019-10-23 DIAGNOSIS — J9621 Acute and chronic respiratory failure with hypoxia: Secondary | ICD-10-CM

## 2019-10-24 DIAGNOSIS — R6520 Severe sepsis without septic shock: Secondary | ICD-10-CM

## 2019-10-24 DIAGNOSIS — I5022 Chronic systolic (congestive) heart failure: Secondary | ICD-10-CM

## 2019-10-24 DIAGNOSIS — I469 Cardiac arrest, cause unspecified: Secondary | ICD-10-CM

## 2019-10-24 DIAGNOSIS — J9621 Acute and chronic respiratory failure with hypoxia: Secondary | ICD-10-CM

## 2019-10-25 DIAGNOSIS — R6520 Severe sepsis without septic shock: Secondary | ICD-10-CM

## 2019-10-25 DIAGNOSIS — I5022 Chronic systolic (congestive) heart failure: Secondary | ICD-10-CM

## 2019-10-25 DIAGNOSIS — J9621 Acute and chronic respiratory failure with hypoxia: Secondary | ICD-10-CM

## 2019-10-25 DIAGNOSIS — I469 Cardiac arrest, cause unspecified: Secondary | ICD-10-CM

## 2019-10-26 DIAGNOSIS — I5022 Chronic systolic (congestive) heart failure: Secondary | ICD-10-CM

## 2019-10-26 DIAGNOSIS — I469 Cardiac arrest, cause unspecified: Secondary | ICD-10-CM

## 2019-10-26 DIAGNOSIS — R6520 Severe sepsis without septic shock: Secondary | ICD-10-CM

## 2019-10-26 DIAGNOSIS — J9621 Acute and chronic respiratory failure with hypoxia: Secondary | ICD-10-CM

## 2019-10-27 DIAGNOSIS — I469 Cardiac arrest, cause unspecified: Secondary | ICD-10-CM

## 2019-10-27 DIAGNOSIS — R6520 Severe sepsis without septic shock: Secondary | ICD-10-CM

## 2019-10-27 DIAGNOSIS — I5022 Chronic systolic (congestive) heart failure: Secondary | ICD-10-CM

## 2019-10-27 DIAGNOSIS — J9621 Acute and chronic respiratory failure with hypoxia: Secondary | ICD-10-CM

## 2019-10-29 ENCOUNTER — Other Ambulatory Visit: Payer: Self-pay | Admitting: Thoracic Surgery (Cardiothoracic Vascular Surgery)

## 2019-10-29 DIAGNOSIS — Z951 Presence of aortocoronary bypass graft: Secondary | ICD-10-CM

## 2019-10-30 ENCOUNTER — Ambulatory Visit: Payer: Medicare Other | Admitting: Thoracic Surgery (Cardiothoracic Vascular Surgery)

## 2019-11-02 ENCOUNTER — Ambulatory Visit: Payer: Medicare Other | Admitting: General Practice

## 2019-11-09 DEATH — deceased

## 2021-09-26 IMAGING — DX DG CHEST 1V PORT
1 series · 1 of 1 positions shown · non-contrast
Comparison: 07/26/2019

CLINICAL DATA: Postoperative CABG

EXAM:
PORTABLE CHEST 1 VIEW

[chest]
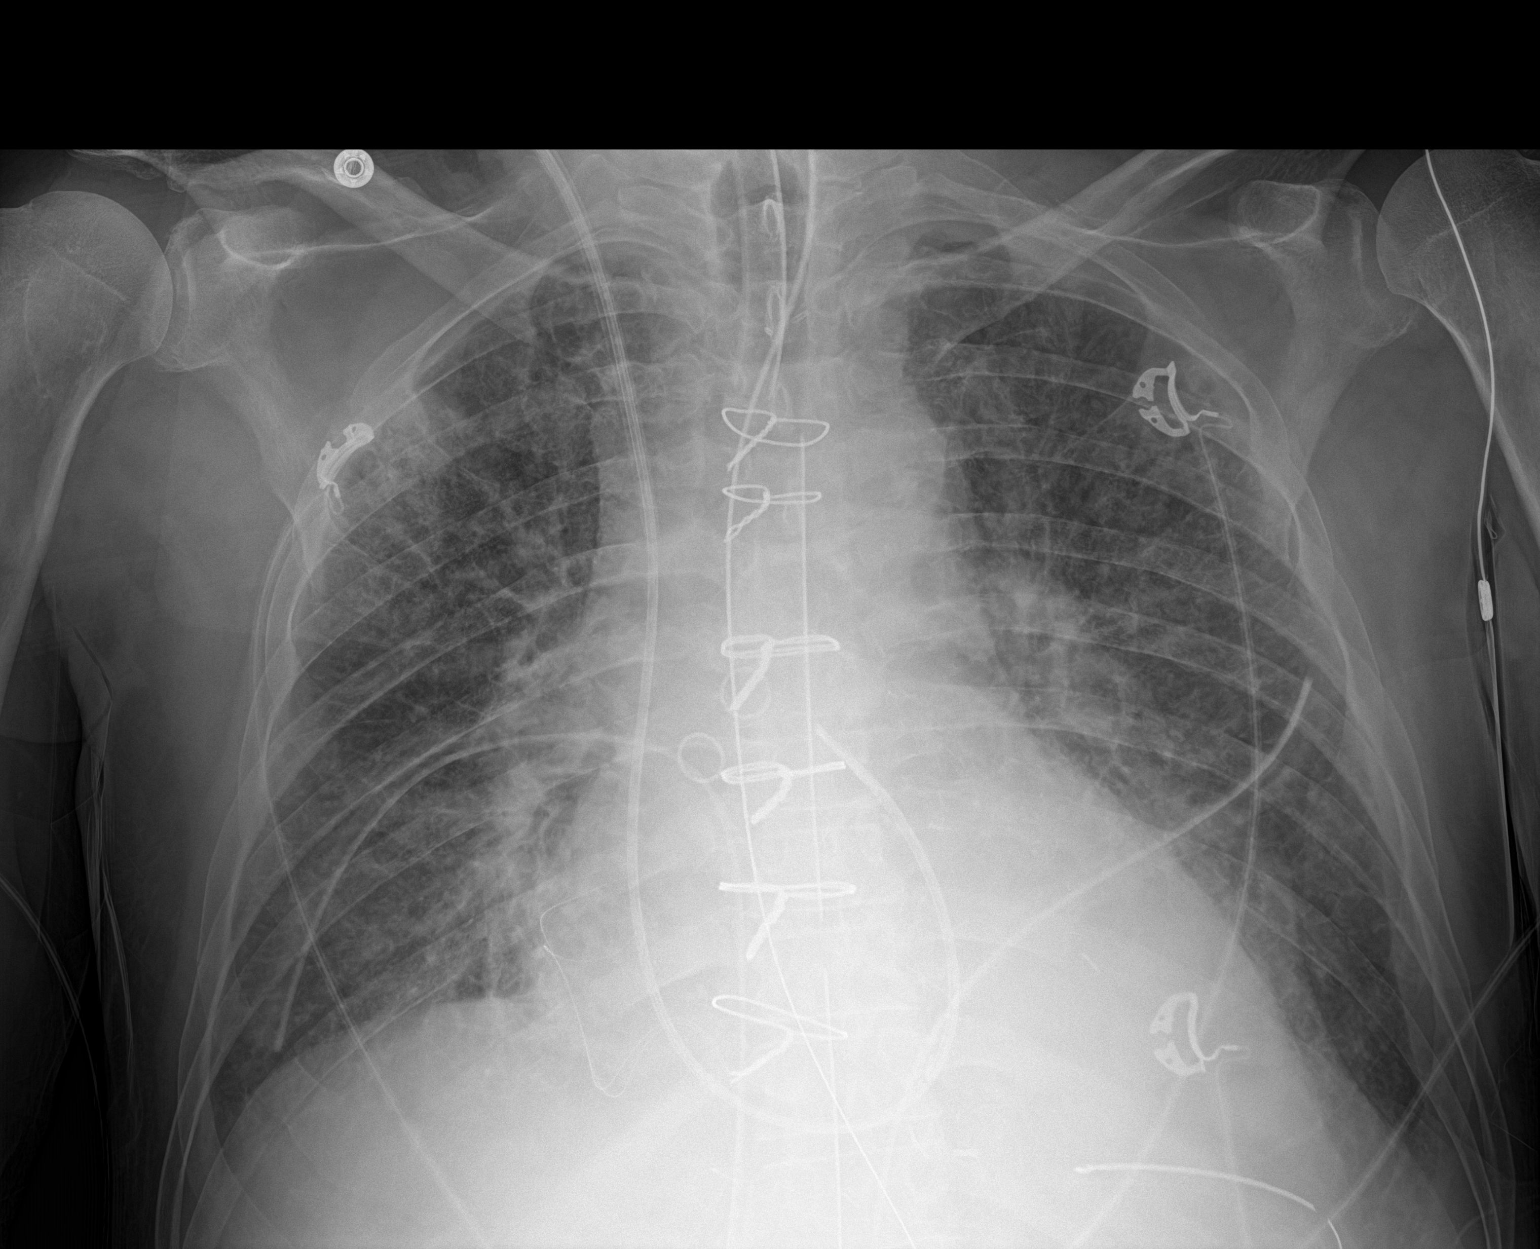

[1 of 1 positions shown; findings below may reference images not displayed]

FINDINGS: Endotracheal tube terminates 4.6 cm above the carina. Enteric tube
courses below the diaphragm with distal tip beyond the inferior
margin of the film. Right IJ approach Swan-Ganz catheter terminates
in the region of the pulmonary trunk. Bilateral chest tubes in
place.

Stable heart size. No focal airspace consolidation. No large pleural
fluid collection. No pneumothorax is identified.
IMPRESSION: Status post CABG with support lines and tubes in satisfactory
position. No pneumothorax.

## 2021-09-27 IMAGING — DX DG CHEST 1V PORT
1 series · 1 of 1 positions shown · non-contrast
Comparison: Chest radiograph from earlier today.

CLINICAL DATA: Status post ECMO and CABG

EXAM:
PORTABLE CHEST 1 VIEW

[chest]
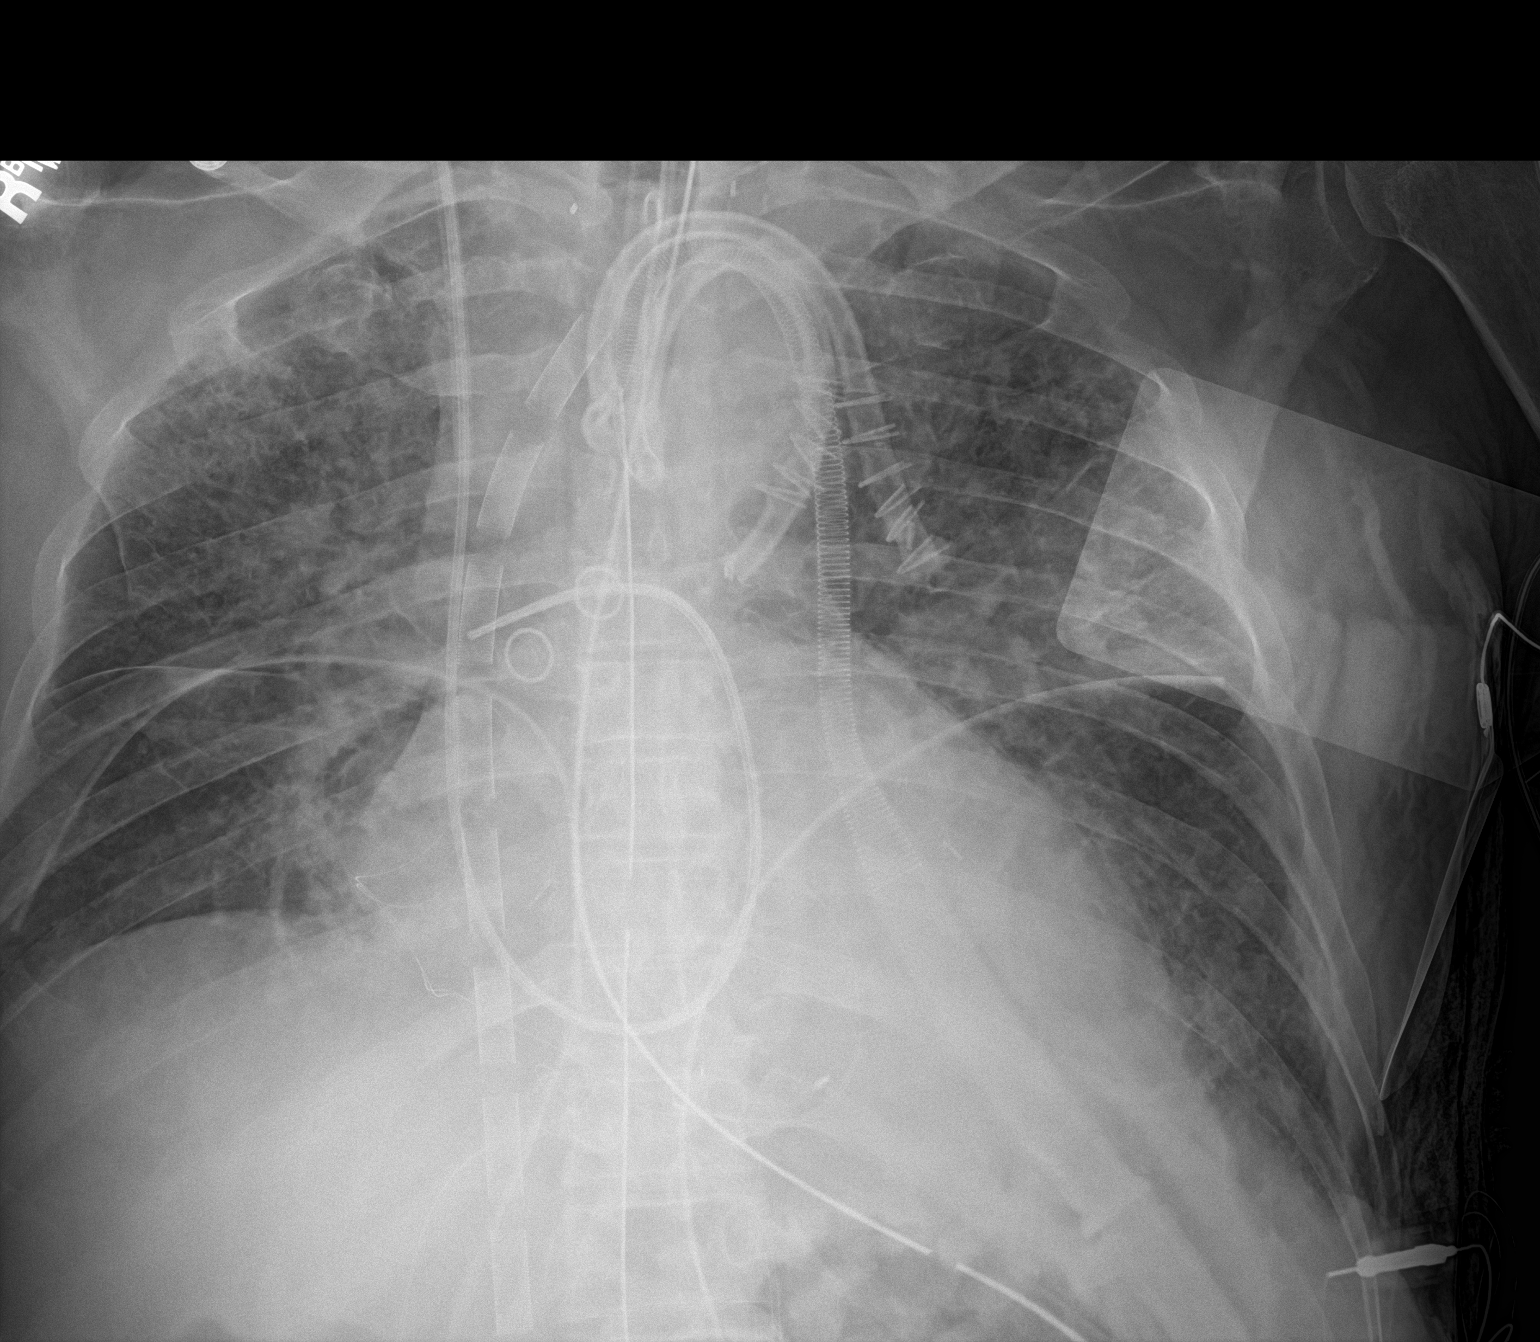

[1 of 1 positions shown; findings below may reference images not displayed]

FINDINGS: Endotracheal tube tip is 3.7 cm above the carina. Enteric tube
enters stomach with the tip not seen on this image. Right internal
jugular Swan-Ganz catheter terminates over the right pulmonary
artery. Inferior approach mediastinal drain terminates over midline
upper mediastinum. Stable position of ECMO catheters overlying the
upper mediastinum. Stable bilateral chest tube position. Pacer pad
overlies the left chest. Stable cardiomediastinal silhouette with
mild cardio. No pneumothorax. No pleural effusion. Moderate to
severe pulmonary edema appears unchanged. Stable left basilar
atelectasis.
IMPRESSION: 1. Well-positioned support structures.  No pneumothorax.
2. Stable mild cardiomegaly and moderate to severe pulmonary edema.
3. Stable left basilar atelectasis.

## 2021-09-27 IMAGING — CR DG CHEST 1V
1 series · 1 of 1 positions shown · non-contrast
Comparison: Earlier today

CLINICAL DATA: Status post emergent ECMO and CABG. Rule out foreign
bodies in pneumothorax.

EXAM:
CHEST  1 VIEW

[AP]
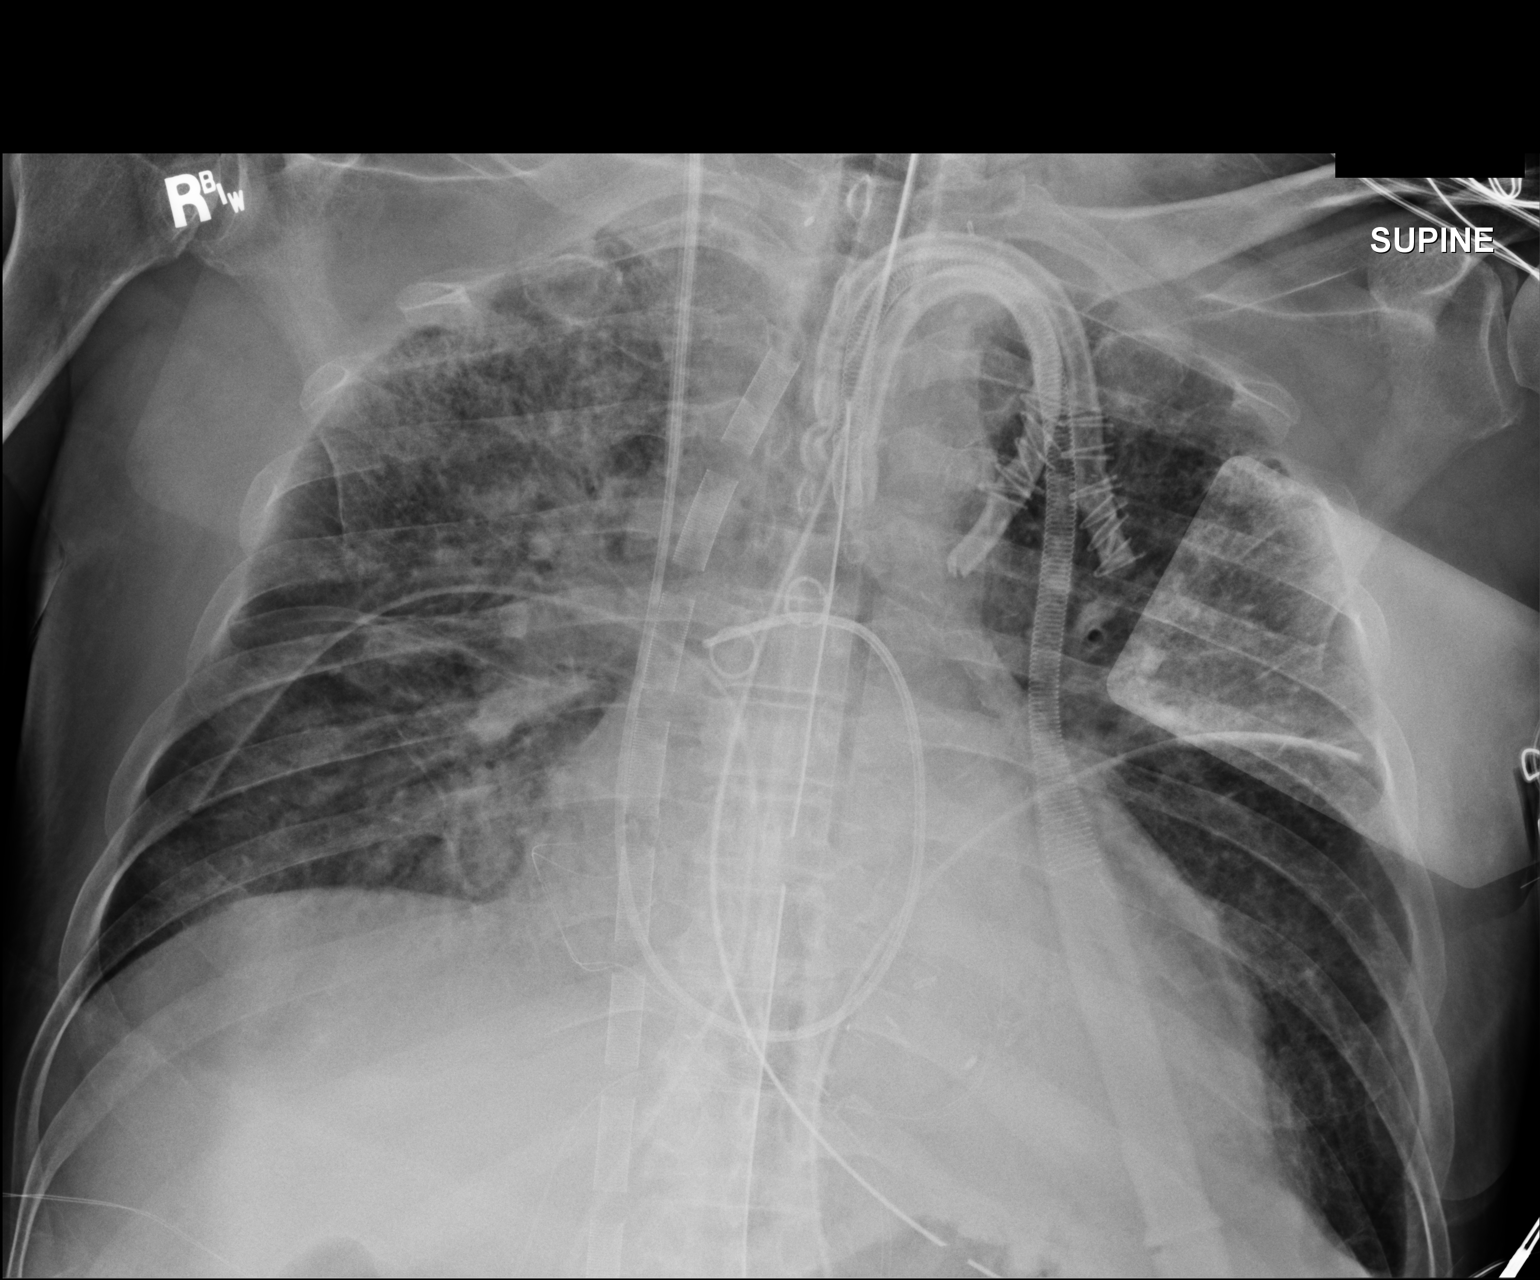

[1 of 1 positions shown; findings below may reference images not displayed]

FINDINGS: Support apparatus including: Endotracheal tube, ECMO device,
mediastinal drain, bilateral chest tubes, and nasogastric tube
identified. No pneumothorax identified. Heart size is mildly
enlarged. Diffuse bilateral interstitial opacities compatible with
pulmonary edema, increased from previous exam.
IMPRESSION: 1. Bilateral chest tubes in place without evidence for pneumothorax.
2. Increase in pulmonary edema.

## 2021-09-28 IMAGING — DX DG CHEST 1V PORT
1 series · 1 of 1 positions shown · non-contrast
Comparison: 08/03/2019.

CLINICAL DATA: ECMO and history of CABG.

EXAM:
PORTABLE CHEST 1 VIEW

[chest]
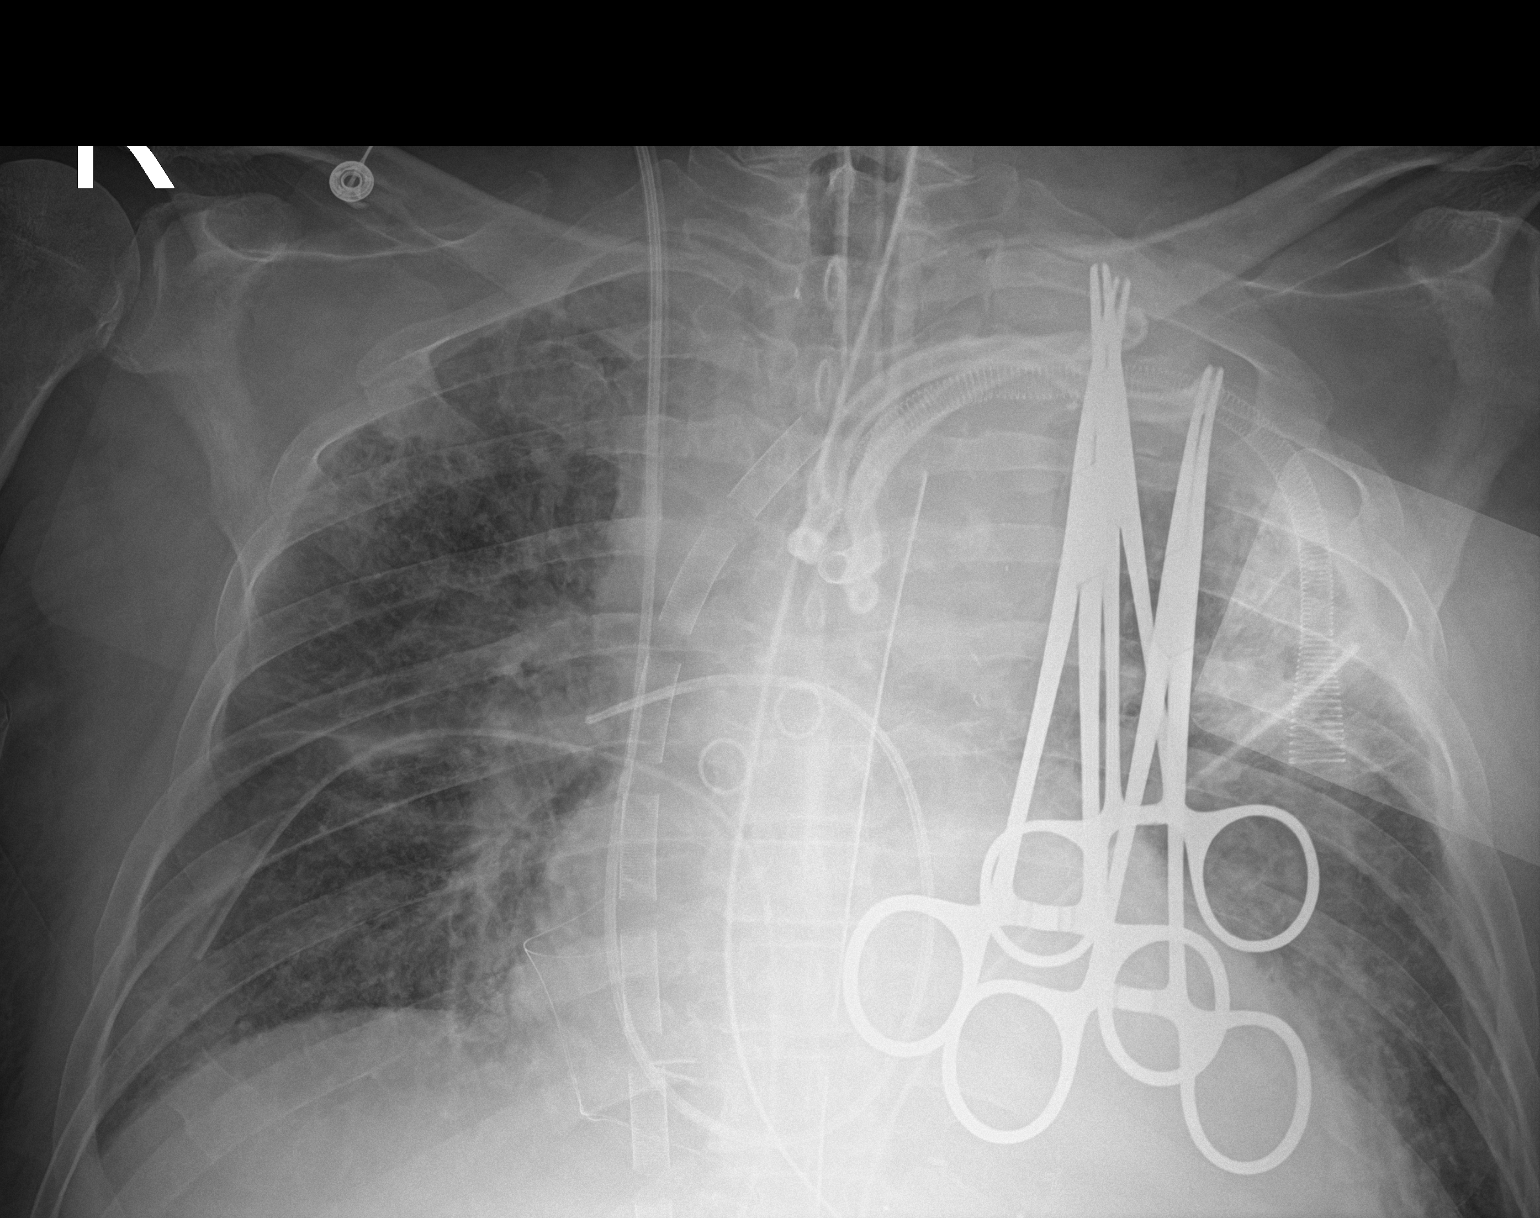

[1 of 1 positions shown; findings below may reference images not displayed]

FINDINGS: Again noted are ECMO catheters overlying the chest. Right jugular
central line with a Swan-Ganz catheter is present. Swan Ganz
catheter tip is in the distal right pulmonary artery region and
unchanged. Evidence for bilateral chest tubes. Endotracheal tube is
appropriately positioned above the carina. Low lung volumes with
diffuse interstitial densities likely representing some pulmonary
edema. Overall, lung low lung volumes. Cannot exclude increased
densities in the left apex and medial left chest. Nasogastric tube
extends into the abdomen. Negative for a pneumothorax.
IMPRESSION: 1. Low lung volumes and evidence for interstitial edema. Cannot
exclude increased densities in the left apical region.
2. Support apparatuses as described. Bilateral chest tubes without a
pneumothorax.

## 2021-10-01 IMAGING — DX DG CHEST 1V
1 series · 1 of 1 positions shown · non-contrast
Comparison: Chest x-ray 08/06/2019.

CLINICAL DATA: Intubation.

EXAM:
CHEST  1 VIEW

[chest]
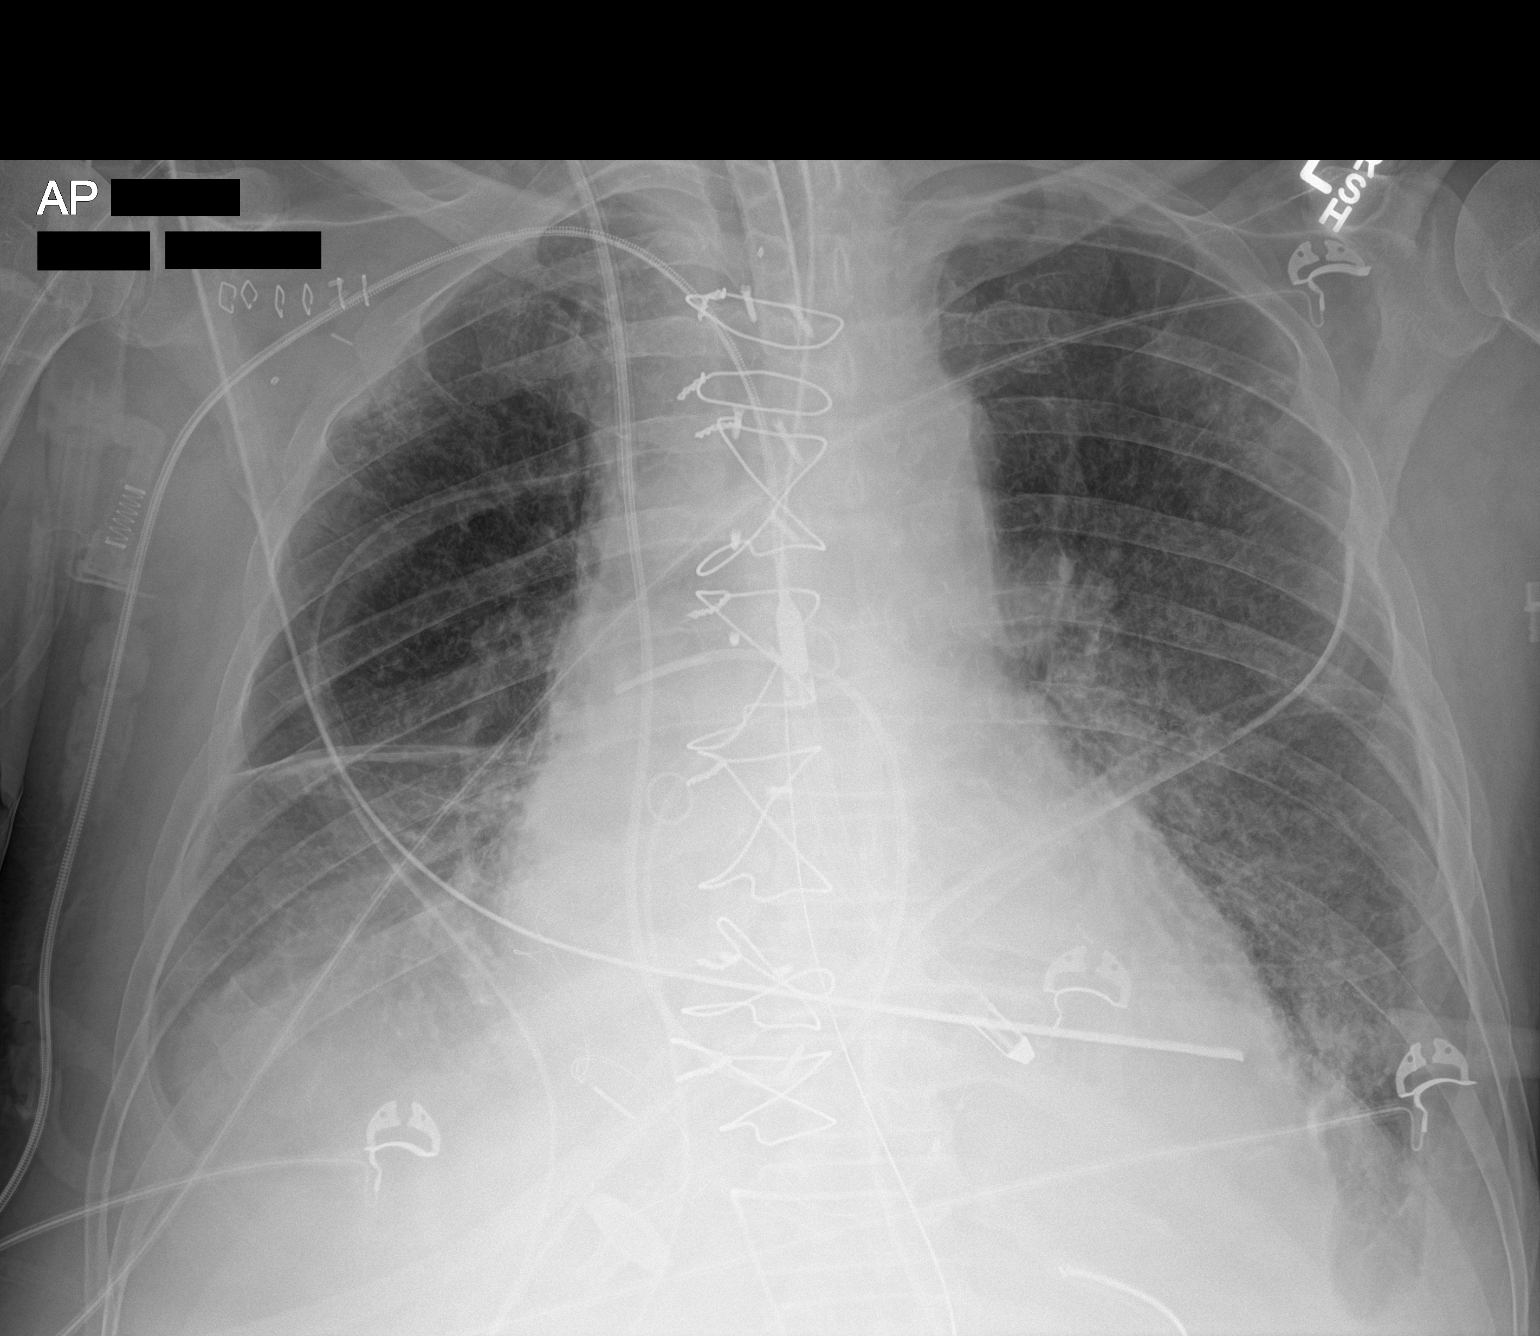

[1 of 1 positions shown; findings below may reference images not displayed]

FINDINGS: Endotracheal tube, NG tube, Swan-Ganz catheter, bilateral chest
tubes in stable position. Impella device in stable position. Prior
CABG. Cardiomegaly. Mild bilateral interstitial prominence again
noted small bilateral pleural effusions again noted. Findings
consistent with CHF. Similar findings noted on prior exam. No
pneumothorax.
IMPRESSION: 1. Lines and tubes including bilateral chest tubes in stable
position. No pneumothorax.

2. Prior CABG. Cardiomegaly with bilateral interstitial prominence
and bilateral pleural effusions again noted consistent with CHF.
Similar findings noted on prior exam. Bibasilar atelectasis.
